# Patient Record
Sex: Female | Born: 1950
Health system: Southern US, Community
[De-identification: ages and names within clinical notes are randomized; demographics above are authoritative.]

## PROBLEM LIST (undated history)

## (undated) ENCOUNTER — Emergency Department (HOSPITAL_COMMUNITY): Admission: EM | Payer: Medicare Other | Source: Home / Self Care

## (undated) DIAGNOSIS — D649 Anemia, unspecified: Secondary | ICD-10-CM

## (undated) DIAGNOSIS — K635 Polyp of colon: Secondary | ICD-10-CM

## (undated) DIAGNOSIS — N959 Unspecified menopausal and perimenopausal disorder: Secondary | ICD-10-CM

## (undated) DIAGNOSIS — F329 Major depressive disorder, single episode, unspecified: Secondary | ICD-10-CM

## (undated) DIAGNOSIS — E860 Dehydration: Secondary | ICD-10-CM

## (undated) DIAGNOSIS — A419 Sepsis, unspecified organism: Secondary | ICD-10-CM

## (undated) DIAGNOSIS — K219 Gastro-esophageal reflux disease without esophagitis: Secondary | ICD-10-CM

## (undated) DIAGNOSIS — F419 Anxiety disorder, unspecified: Secondary | ICD-10-CM

## (undated) DIAGNOSIS — J189 Pneumonia, unspecified organism: Secondary | ICD-10-CM

## (undated) DIAGNOSIS — E669 Obesity, unspecified: Secondary | ICD-10-CM

## (undated) DIAGNOSIS — F3289 Other specified depressive episodes: Secondary | ICD-10-CM

## (undated) DIAGNOSIS — M1990 Unspecified osteoarthritis, unspecified site: Secondary | ICD-10-CM

## (undated) DIAGNOSIS — G4733 Obstructive sleep apnea (adult) (pediatric): Secondary | ICD-10-CM

## (undated) DIAGNOSIS — R011 Cardiac murmur, unspecified: Secondary | ICD-10-CM

## (undated) DIAGNOSIS — Z8489 Family history of other specified conditions: Secondary | ICD-10-CM

## (undated) DIAGNOSIS — Z8719 Personal history of other diseases of the digestive system: Secondary | ICD-10-CM

## (undated) DIAGNOSIS — I10 Essential (primary) hypertension: Secondary | ICD-10-CM

## (undated) DIAGNOSIS — E119 Type 2 diabetes mellitus without complications: Secondary | ICD-10-CM

## (undated) DIAGNOSIS — R6521 Severe sepsis with septic shock: Secondary | ICD-10-CM

## (undated) DIAGNOSIS — G8929 Other chronic pain: Secondary | ICD-10-CM

## (undated) DIAGNOSIS — T8859XA Other complications of anesthesia, initial encounter: Secondary | ICD-10-CM

## (undated) DIAGNOSIS — M549 Dorsalgia, unspecified: Secondary | ICD-10-CM

## (undated) DIAGNOSIS — R05 Cough: Secondary | ICD-10-CM

## (undated) DIAGNOSIS — R058 Other specified cough: Secondary | ICD-10-CM

## (undated) DIAGNOSIS — J4489 Other specified chronic obstructive pulmonary disease: Secondary | ICD-10-CM

## (undated) DIAGNOSIS — E559 Vitamin D deficiency, unspecified: Secondary | ICD-10-CM

## (undated) DIAGNOSIS — T4145XA Adverse effect of unspecified anesthetic, initial encounter: Secondary | ICD-10-CM

## (undated) DIAGNOSIS — M479 Spondylosis, unspecified: Secondary | ICD-10-CM

## (undated) DIAGNOSIS — T7840XA Allergy, unspecified, initial encounter: Secondary | ICD-10-CM

## (undated) DIAGNOSIS — G43909 Migraine, unspecified, not intractable, without status migrainosus: Secondary | ICD-10-CM

## (undated) DIAGNOSIS — E1142 Type 2 diabetes mellitus with diabetic polyneuropathy: Secondary | ICD-10-CM

## (undated) DIAGNOSIS — J449 Chronic obstructive pulmonary disease, unspecified: Secondary | ICD-10-CM

## (undated) HISTORY — DX: Dehydration: E86.0

## (undated) HISTORY — DX: Unspecified menopausal and perimenopausal disorder: N95.9

## (undated) HISTORY — PX: ABDOMINAL HYSTERECTOMY: SHX81

## (undated) HISTORY — PX: CHOLECYSTECTOMY OPEN: SUR202

## (undated) HISTORY — DX: Other specified depressive episodes: F32.89

## (undated) HISTORY — PX: EXPLORATORY LAPAROTOMY: SUR591

## (undated) HISTORY — PX: INCISION AND DRAINAGE ABSCESS: SHX5864

## (undated) HISTORY — DX: Obesity, unspecified: E66.9

## (undated) HISTORY — PX: JOINT REPLACEMENT: SHX530

## (undated) HISTORY — DX: Allergy, unspecified, initial encounter: T78.40XA

## (undated) HISTORY — PX: DILATION AND CURETTAGE OF UTERUS: SHX78

## (undated) HISTORY — PX: APPENDECTOMY: SHX54

## (undated) HISTORY — DX: Essential (primary) hypertension: I10

## (undated) HISTORY — DX: Other specified chronic obstructive pulmonary disease: J44.89

## (undated) HISTORY — PX: EYE SURGERY: SHX253

## (undated) HISTORY — DX: Chronic obstructive pulmonary disease, unspecified: J44.9

## (undated) HISTORY — PX: REFRACTIVE SURGERY: SHX103

## (undated) HISTORY — PX: FOOT SURGERY: SHX648

## (undated) HISTORY — PX: ANTERIOR CERVICAL DECOMP/DISCECTOMY FUSION: SHX1161

## (undated) HISTORY — DX: Spondylosis, unspecified: M47.9

## (undated) HISTORY — DX: Anemia, unspecified: D64.9

## (undated) HISTORY — DX: Vitamin D deficiency, unspecified: E55.9

## (undated) HISTORY — DX: Polyp of colon: K63.5

## (undated) HISTORY — DX: Anxiety disorder, unspecified: F41.9

## (undated) HISTORY — PX: BACK SURGERY: SHX140

## (undated) HISTORY — DX: Major depressive disorder, single episode, unspecified: F32.9

## (undated) HISTORY — DX: Gastro-esophageal reflux disease without esophagitis: K21.9

## (undated) HISTORY — PX: TONSILLECTOMY: SUR1361

---

## 1997-10-23 ENCOUNTER — Encounter: Admission: RE | Admit: 1997-10-23 | Discharge: 1998-01-21 | Payer: Self-pay | Admitting: Pulmonary Disease

## 1998-07-09 ENCOUNTER — Emergency Department (HOSPITAL_COMMUNITY): Admission: EM | Admit: 1998-07-09 | Discharge: 1998-07-09 | Payer: Self-pay | Admitting: Emergency Medicine

## 1999-02-21 ENCOUNTER — Ambulatory Visit (HOSPITAL_COMMUNITY): Admission: RE | Admit: 1999-02-21 | Discharge: 1999-02-21 | Payer: Self-pay | Admitting: Pulmonary Disease

## 1999-02-21 ENCOUNTER — Encounter: Payer: Self-pay | Admitting: Pulmonary Disease

## 1999-02-25 ENCOUNTER — Ambulatory Visit (HOSPITAL_COMMUNITY): Admission: RE | Admit: 1999-02-25 | Discharge: 1999-02-25 | Payer: Self-pay | Admitting: Pulmonary Disease

## 1999-02-25 ENCOUNTER — Encounter: Payer: Self-pay | Admitting: Pulmonary Disease

## 1999-10-13 ENCOUNTER — Inpatient Hospital Stay (HOSPITAL_COMMUNITY): Admission: AD | Admit: 1999-10-13 | Discharge: 1999-10-23 | Payer: Self-pay | Admitting: Pulmonary Disease

## 1999-10-18 ENCOUNTER — Encounter: Payer: Self-pay | Admitting: Pulmonary Disease

## 1999-10-21 ENCOUNTER — Encounter: Payer: Self-pay | Admitting: Pulmonary Disease

## 1999-12-23 ENCOUNTER — Ambulatory Visit (HOSPITAL_COMMUNITY): Admission: RE | Admit: 1999-12-23 | Discharge: 1999-12-23 | Payer: Self-pay | Admitting: Pulmonary Disease

## 2000-01-30 ENCOUNTER — Ambulatory Visit (HOSPITAL_COMMUNITY): Admission: RE | Admit: 2000-01-30 | Discharge: 2000-01-30 | Payer: Self-pay | Admitting: General Surgery

## 2000-03-31 ENCOUNTER — Encounter: Payer: Self-pay | Admitting: Pulmonary Disease

## 2000-03-31 ENCOUNTER — Ambulatory Visit (HOSPITAL_COMMUNITY): Admission: RE | Admit: 2000-03-31 | Discharge: 2000-03-31 | Payer: Self-pay | Admitting: Pulmonary Disease

## 2000-10-03 ENCOUNTER — Emergency Department (HOSPITAL_COMMUNITY): Admission: EM | Admit: 2000-10-03 | Discharge: 2000-10-03 | Payer: Self-pay

## 2001-04-20 ENCOUNTER — Ambulatory Visit (HOSPITAL_COMMUNITY): Admission: RE | Admit: 2001-04-20 | Discharge: 2001-04-20 | Payer: Self-pay | Admitting: Pulmonary Disease

## 2001-04-20 ENCOUNTER — Encounter: Payer: Self-pay | Admitting: Pulmonary Disease

## 2001-08-24 ENCOUNTER — Encounter: Payer: Self-pay | Admitting: Cardiology

## 2001-08-24 ENCOUNTER — Ambulatory Visit (HOSPITAL_COMMUNITY): Admission: RE | Admit: 2001-08-24 | Discharge: 2001-08-24 | Payer: Self-pay | Admitting: Cardiology

## 2002-01-24 ENCOUNTER — Other Ambulatory Visit (HOSPITAL_COMMUNITY): Admission: RE | Admit: 2002-01-24 | Discharge: 2002-02-04 | Payer: Self-pay | Admitting: Psychiatry

## 2002-05-30 ENCOUNTER — Ambulatory Visit (HOSPITAL_COMMUNITY): Admission: RE | Admit: 2002-05-30 | Discharge: 2002-05-30 | Payer: Self-pay | Admitting: Pulmonary Disease

## 2002-05-30 ENCOUNTER — Encounter: Payer: Self-pay | Admitting: Pulmonary Disease

## 2002-10-03 ENCOUNTER — Encounter: Admission: RE | Admit: 2002-10-03 | Discharge: 2003-01-01 | Payer: Self-pay | Admitting: Pulmonary Disease

## 2002-11-23 ENCOUNTER — Emergency Department (HOSPITAL_COMMUNITY): Admission: EM | Admit: 2002-11-23 | Discharge: 2002-11-23 | Payer: Self-pay | Admitting: Emergency Medicine

## 2003-01-03 ENCOUNTER — Encounter: Admission: RE | Admit: 2003-01-03 | Discharge: 2003-04-03 | Payer: Self-pay | Admitting: Pulmonary Disease

## 2003-04-11 ENCOUNTER — Encounter: Admission: RE | Admit: 2003-04-11 | Discharge: 2003-07-10 | Payer: Self-pay | Admitting: Pulmonary Disease

## 2003-07-30 ENCOUNTER — Encounter (HOSPITAL_COMMUNITY): Admission: RE | Admit: 2003-07-30 | Discharge: 2003-10-28 | Payer: Self-pay | Admitting: Cardiology

## 2003-08-06 ENCOUNTER — Ambulatory Visit (HOSPITAL_COMMUNITY): Admission: RE | Admit: 2003-08-06 | Discharge: 2003-08-06 | Payer: Self-pay | Admitting: Pulmonary Disease

## 2003-08-27 ENCOUNTER — Emergency Department (HOSPITAL_COMMUNITY): Admission: EM | Admit: 2003-08-27 | Discharge: 2003-08-27 | Payer: Self-pay | Admitting: Emergency Medicine

## 2003-10-16 ENCOUNTER — Ambulatory Visit: Admission: RE | Admit: 2003-10-16 | Discharge: 2003-10-16 | Payer: Self-pay | Admitting: Orthopedic Surgery

## 2003-10-21 HISTORY — PX: TOTAL KNEE ARTHROPLASTY: SHX125

## 2003-10-24 ENCOUNTER — Inpatient Hospital Stay (HOSPITAL_COMMUNITY): Admission: RE | Admit: 2003-10-24 | Discharge: 2003-10-29 | Payer: Self-pay | Admitting: Orthopedic Surgery

## 2003-10-29 ENCOUNTER — Inpatient Hospital Stay (HOSPITAL_COMMUNITY)
Admission: RE | Admit: 2003-10-29 | Discharge: 2003-11-08 | Payer: Self-pay | Admitting: Physical Medicine & Rehabilitation

## 2003-12-14 ENCOUNTER — Encounter: Admission: RE | Admit: 2003-12-14 | Discharge: 2004-01-15 | Payer: Self-pay | Admitting: Orthopedic Surgery

## 2004-01-21 HISTORY — PX: TOTAL KNEE ARTHROPLASTY: SHX125

## 2004-02-11 ENCOUNTER — Inpatient Hospital Stay (HOSPITAL_COMMUNITY): Admission: RE | Admit: 2004-02-11 | Discharge: 2004-02-14 | Payer: Self-pay | Admitting: Orthopedic Surgery

## 2004-02-14 ENCOUNTER — Inpatient Hospital Stay (HOSPITAL_COMMUNITY)
Admission: RE | Admit: 2004-02-14 | Discharge: 2004-02-22 | Payer: Self-pay | Admitting: Physical Medicine & Rehabilitation

## 2004-02-14 ENCOUNTER — Ambulatory Visit: Payer: Self-pay | Admitting: Physical Medicine & Rehabilitation

## 2004-03-18 ENCOUNTER — Encounter: Admission: RE | Admit: 2004-03-18 | Discharge: 2004-05-22 | Payer: Self-pay | Admitting: Orthopedic Surgery

## 2004-08-11 ENCOUNTER — Inpatient Hospital Stay (HOSPITAL_COMMUNITY): Admission: RE | Admit: 2004-08-11 | Discharge: 2004-08-16 | Payer: Self-pay | Admitting: Neurosurgery

## 2004-08-23 ENCOUNTER — Emergency Department (HOSPITAL_COMMUNITY): Admission: EM | Admit: 2004-08-23 | Discharge: 2004-08-23 | Payer: Self-pay | Admitting: Emergency Medicine

## 2004-10-28 ENCOUNTER — Ambulatory Visit (HOSPITAL_COMMUNITY): Admission: RE | Admit: 2004-10-28 | Discharge: 2004-10-28 | Payer: Self-pay | Admitting: Family Medicine

## 2004-12-05 ENCOUNTER — Encounter: Admission: RE | Admit: 2004-12-05 | Discharge: 2004-12-19 | Payer: Self-pay | Admitting: Orthopedic Surgery

## 2004-12-15 ENCOUNTER — Emergency Department (HOSPITAL_COMMUNITY): Admission: EM | Admit: 2004-12-15 | Discharge: 2004-12-15 | Payer: Self-pay | Admitting: Emergency Medicine

## 2004-12-20 ENCOUNTER — Encounter: Admission: RE | Admit: 2004-12-20 | Discharge: 2005-01-28 | Payer: Self-pay | Admitting: Orthopedic Surgery

## 2004-12-24 ENCOUNTER — Ambulatory Visit (HOSPITAL_BASED_OUTPATIENT_CLINIC_OR_DEPARTMENT_OTHER): Admission: RE | Admit: 2004-12-24 | Discharge: 2004-12-24 | Payer: Self-pay | Admitting: Internal Medicine

## 2005-01-04 ENCOUNTER — Ambulatory Visit: Payer: Self-pay | Admitting: Internal Medicine

## 2005-02-16 ENCOUNTER — Ambulatory Visit (HOSPITAL_BASED_OUTPATIENT_CLINIC_OR_DEPARTMENT_OTHER): Admission: RE | Admit: 2005-02-16 | Discharge: 2005-02-16 | Payer: Self-pay | Admitting: Internal Medicine

## 2005-02-23 ENCOUNTER — Ambulatory Visit: Payer: Self-pay | Admitting: Internal Medicine

## 2005-03-03 ENCOUNTER — Ambulatory Visit: Payer: Self-pay | Admitting: Family Medicine

## 2005-03-03 ENCOUNTER — Encounter: Payer: Self-pay | Admitting: Family Medicine

## 2005-03-03 ENCOUNTER — Ambulatory Visit (HOSPITAL_COMMUNITY): Admission: RE | Admit: 2005-03-03 | Discharge: 2005-03-03 | Payer: Self-pay | Admitting: Neurosurgery

## 2005-04-14 ENCOUNTER — Ambulatory Visit: Payer: Self-pay | Admitting: Internal Medicine

## 2005-04-15 ENCOUNTER — Ambulatory Visit: Payer: Self-pay | Admitting: Internal Medicine

## 2005-04-20 ENCOUNTER — Ambulatory Visit (HOSPITAL_COMMUNITY): Admission: RE | Admit: 2005-04-20 | Discharge: 2005-04-20 | Payer: Self-pay | Admitting: Internal Medicine

## 2005-07-03 ENCOUNTER — Ambulatory Visit: Payer: Self-pay | Admitting: Internal Medicine

## 2005-08-07 ENCOUNTER — Encounter: Admission: RE | Admit: 2005-08-07 | Discharge: 2005-11-05 | Payer: Self-pay | Admitting: Orthopedic Surgery

## 2005-11-18 ENCOUNTER — Encounter (HOSPITAL_COMMUNITY): Admission: RE | Admit: 2005-11-18 | Discharge: 2006-01-11 | Payer: Self-pay | Admitting: Cardiology

## 2005-11-19 ENCOUNTER — Ambulatory Visit (HOSPITAL_COMMUNITY): Admission: RE | Admit: 2005-11-19 | Discharge: 2005-11-19 | Payer: Self-pay | Admitting: Pulmonary Disease

## 2005-12-18 ENCOUNTER — Encounter: Admission: RE | Admit: 2005-12-18 | Discharge: 2006-01-08 | Payer: Self-pay | Admitting: Family Medicine

## 2006-01-25 ENCOUNTER — Ambulatory Visit: Payer: Self-pay | Admitting: Internal Medicine

## 2006-01-26 ENCOUNTER — Ambulatory Visit (HOSPITAL_COMMUNITY): Admission: RE | Admit: 2006-01-26 | Discharge: 2006-01-26 | Payer: Self-pay | Admitting: Pulmonary Disease

## 2006-01-29 ENCOUNTER — Ambulatory Visit (HOSPITAL_COMMUNITY): Admission: RE | Admit: 2006-01-29 | Discharge: 2006-01-29 | Payer: Self-pay | Admitting: Internal Medicine

## 2006-02-08 ENCOUNTER — Ambulatory Visit: Payer: Self-pay | Admitting: Internal Medicine

## 2006-03-09 ENCOUNTER — Ambulatory Visit (HOSPITAL_COMMUNITY): Admission: RE | Admit: 2006-03-09 | Discharge: 2006-03-09 | Payer: Self-pay | Admitting: Orthopedic Surgery

## 2006-07-12 ENCOUNTER — Ambulatory Visit (HOSPITAL_BASED_OUTPATIENT_CLINIC_OR_DEPARTMENT_OTHER): Admission: RE | Admit: 2006-07-12 | Discharge: 2006-07-12 | Payer: Self-pay | Admitting: Pulmonary Disease

## 2006-07-18 ENCOUNTER — Ambulatory Visit: Payer: Self-pay | Admitting: Internal Medicine

## 2006-12-07 ENCOUNTER — Ambulatory Visit (HOSPITAL_COMMUNITY): Admission: RE | Admit: 2006-12-07 | Discharge: 2006-12-07 | Payer: Self-pay | Admitting: Pulmonary Disease

## 2006-12-09 ENCOUNTER — Encounter (HOSPITAL_COMMUNITY): Admission: RE | Admit: 2006-12-09 | Discharge: 2006-12-13 | Payer: Self-pay | Admitting: Cardiology

## 2007-03-07 ENCOUNTER — Encounter: Admission: RE | Admit: 2007-03-07 | Discharge: 2007-03-07 | Payer: Self-pay | Admitting: Cardiology

## 2007-03-07 ENCOUNTER — Encounter: Admission: RE | Admit: 2007-03-07 | Discharge: 2007-03-07 | Payer: Self-pay | Admitting: Neurosurgery

## 2007-03-14 ENCOUNTER — Ambulatory Visit (HOSPITAL_COMMUNITY): Admission: RE | Admit: 2007-03-14 | Discharge: 2007-03-14 | Payer: Self-pay | Admitting: Cardiology

## 2007-07-19 ENCOUNTER — Ambulatory Visit: Payer: Self-pay | Admitting: Internal Medicine

## 2007-07-19 LAB — CONVERTED CEMR LAB
Eosinophils Absolute: 0.1 10*3/uL (ref 0.0–0.6)
Eosinophils Relative: 1.5 % (ref 0.0–5.0)
HCT: 36.3 % (ref 36.0–46.0)
Lymphocytes Relative: 29.2 % (ref 12.0–46.0)
MCV: 86.6 fL (ref 78.0–100.0)
Neutro Abs: 4.6 10*3/uL (ref 1.4–7.7)
Neutrophils Relative %: 63.7 % (ref 43.0–77.0)
Saturation Ratios: 14.7 % — ABNORMAL LOW (ref 20.0–50.0)
WBC: 7.2 10*3/uL (ref 4.5–10.5)

## 2007-09-26 ENCOUNTER — Ambulatory Visit: Payer: Self-pay | Admitting: Surgery

## 2007-11-29 ENCOUNTER — Ambulatory Visit: Payer: Self-pay | Admitting: Internal Medicine

## 2007-12-02 ENCOUNTER — Ambulatory Visit: Payer: Self-pay | Admitting: Internal Medicine

## 2007-12-02 ENCOUNTER — Encounter: Payer: Self-pay | Admitting: Internal Medicine

## 2007-12-05 ENCOUNTER — Encounter: Payer: Self-pay | Admitting: Internal Medicine

## 2008-01-16 ENCOUNTER — Ambulatory Visit (HOSPITAL_COMMUNITY): Admission: RE | Admit: 2008-01-16 | Discharge: 2008-01-16 | Payer: Self-pay | Admitting: Pulmonary Disease

## 2008-02-14 ENCOUNTER — Ambulatory Visit (HOSPITAL_COMMUNITY): Admission: RE | Admit: 2008-02-14 | Discharge: 2008-02-14 | Payer: Self-pay | Admitting: Pulmonary Disease

## 2008-02-23 ENCOUNTER — Emergency Department (HOSPITAL_COMMUNITY): Admission: EM | Admit: 2008-02-23 | Discharge: 2008-02-23 | Payer: Self-pay | Admitting: Emergency Medicine

## 2008-03-01 ENCOUNTER — Telehealth: Payer: Self-pay | Admitting: Internal Medicine

## 2008-03-13 ENCOUNTER — Encounter: Admission: RE | Admit: 2008-03-13 | Discharge: 2008-03-28 | Payer: Self-pay | Admitting: Podiatry

## 2008-05-14 ENCOUNTER — Encounter: Admission: RE | Admit: 2008-05-14 | Discharge: 2008-06-18 | Payer: Self-pay | Admitting: Podiatry

## 2008-05-29 ENCOUNTER — Telehealth: Payer: Self-pay | Admitting: Internal Medicine

## 2008-05-30 ENCOUNTER — Encounter: Admission: RE | Admit: 2008-05-30 | Discharge: 2008-08-28 | Payer: Self-pay | Admitting: Internal Medicine

## 2008-06-13 ENCOUNTER — Telehealth: Payer: Self-pay | Admitting: Internal Medicine

## 2008-07-03 ENCOUNTER — Encounter: Admission: RE | Admit: 2008-07-03 | Discharge: 2008-08-02 | Payer: Self-pay | Admitting: Podiatry

## 2008-08-16 ENCOUNTER — Ambulatory Visit: Payer: Self-pay | Admitting: Sports Medicine

## 2008-08-16 DIAGNOSIS — M7660 Achilles tendinitis, unspecified leg: Secondary | ICD-10-CM

## 2008-08-16 DIAGNOSIS — M79609 Pain in unspecified limb: Secondary | ICD-10-CM

## 2008-08-17 ENCOUNTER — Encounter: Payer: Self-pay | Admitting: Sports Medicine

## 2008-09-13 ENCOUNTER — Ambulatory Visit: Payer: Self-pay | Admitting: Sports Medicine

## 2008-10-25 ENCOUNTER — Ambulatory Visit: Payer: Self-pay | Admitting: Sports Medicine

## 2008-11-14 ENCOUNTER — Encounter: Payer: Self-pay | Admitting: Internal Medicine

## 2009-06-25 ENCOUNTER — Ambulatory Visit: Payer: Self-pay | Admitting: Sports Medicine

## 2009-08-01 ENCOUNTER — Ambulatory Visit: Payer: Self-pay | Admitting: Sports Medicine

## 2009-08-01 DIAGNOSIS — M7750 Other enthesopathy of unspecified foot: Secondary | ICD-10-CM

## 2009-09-09 ENCOUNTER — Telehealth: Payer: Self-pay | Admitting: Internal Medicine

## 2009-09-09 ENCOUNTER — Ambulatory Visit: Payer: Self-pay | Admitting: Gastroenterology

## 2009-09-09 DIAGNOSIS — I10 Essential (primary) hypertension: Secondary | ICD-10-CM | POA: Insufficient documentation

## 2009-09-09 DIAGNOSIS — M549 Dorsalgia, unspecified: Secondary | ICD-10-CM | POA: Insufficient documentation

## 2009-09-09 DIAGNOSIS — R079 Chest pain, unspecified: Secondary | ICD-10-CM

## 2009-09-09 DIAGNOSIS — R110 Nausea: Secondary | ICD-10-CM

## 2009-09-09 DIAGNOSIS — G4730 Sleep apnea, unspecified: Secondary | ICD-10-CM

## 2009-09-09 DIAGNOSIS — K219 Gastro-esophageal reflux disease without esophagitis: Secondary | ICD-10-CM

## 2009-09-09 DIAGNOSIS — J45909 Unspecified asthma, uncomplicated: Secondary | ICD-10-CM | POA: Insufficient documentation

## 2009-09-09 DIAGNOSIS — J40 Bronchitis, not specified as acute or chronic: Secondary | ICD-10-CM | POA: Insufficient documentation

## 2009-09-16 ENCOUNTER — Ambulatory Visit: Payer: Self-pay | Admitting: Endocrinology

## 2009-09-16 DIAGNOSIS — G622 Polyneuropathy due to other toxic agents: Secondary | ICD-10-CM

## 2009-09-16 DIAGNOSIS — G619 Inflammatory polyneuropathy, unspecified: Secondary | ICD-10-CM | POA: Insufficient documentation

## 2009-09-16 DIAGNOSIS — F329 Major depressive disorder, single episode, unspecified: Secondary | ICD-10-CM

## 2009-09-30 ENCOUNTER — Ambulatory Visit: Payer: Self-pay | Admitting: Endocrinology

## 2009-10-15 ENCOUNTER — Ambulatory Visit: Payer: Self-pay | Admitting: Endocrinology

## 2009-10-30 ENCOUNTER — Telehealth: Payer: Self-pay | Admitting: Internal Medicine

## 2009-11-22 ENCOUNTER — Ambulatory Visit: Payer: Self-pay | Admitting: Internal Medicine

## 2009-11-22 DIAGNOSIS — D649 Anemia, unspecified: Secondary | ICD-10-CM

## 2009-11-22 DIAGNOSIS — J4489 Other specified chronic obstructive pulmonary disease: Secondary | ICD-10-CM | POA: Insufficient documentation

## 2009-11-22 DIAGNOSIS — N959 Unspecified menopausal and perimenopausal disorder: Secondary | ICD-10-CM | POA: Insufficient documentation

## 2009-11-22 DIAGNOSIS — M479 Spondylosis, unspecified: Secondary | ICD-10-CM | POA: Insufficient documentation

## 2009-11-22 DIAGNOSIS — E559 Vitamin D deficiency, unspecified: Secondary | ICD-10-CM | POA: Insufficient documentation

## 2009-11-22 DIAGNOSIS — J449 Chronic obstructive pulmonary disease, unspecified: Secondary | ICD-10-CM

## 2009-11-22 DIAGNOSIS — M25559 Pain in unspecified hip: Secondary | ICD-10-CM

## 2009-11-23 LAB — CONVERTED CEMR LAB
Albumin: 3.6 g/dL (ref 3.5–5.2)
BUN: 25 mg/dL — ABNORMAL HIGH (ref 6–23)
Basophils Relative: 0.4 % (ref 0.0–3.0)
Bilirubin, Direct: 0.1 mg/dL (ref 0.0–0.3)
CO2: 32 meq/L (ref 19–32)
Chloride: 102 meq/L (ref 96–112)
Cholesterol: 183 mg/dL (ref 0–200)
Creatinine, Ser: 1 mg/dL (ref 0.4–1.2)
Eosinophils Absolute: 0.1 10*3/uL (ref 0.0–0.7)
Folate: >20 ng/mL
HCT: 36.1 % (ref 36.0–46.0)
Iron: 57 ug/dL (ref 42–145)
Ketones, ur: NEGATIVE mg/dL
Lymphs Abs: 1.8 10*3/uL (ref 0.7–4.0)
MCHC: 33.7 g/dL (ref 30.0–36.0)
MCV: 84.7 fL (ref 78.0–100.0)
Monocytes Absolute: 0.4 10*3/uL (ref 0.1–1.0)
Neutrophils Relative %: 55.9 % (ref 43.0–77.0)
RBC: 4.27 M/uL (ref 3.87–5.11)
Saturation Ratios: 13.6 % — ABNORMAL LOW (ref 20.0–50.0)
Specific Gravity, Urine: 1.025 (ref 1.000–1.030)
TSH: 1.77 microintl units/mL (ref 0.35–5.50)
Total Protein, Urine: NEGATIVE mg/dL
Total Protein: 7.3 g/dL (ref 6.0–8.3)
Triglycerides: 100 mg/dL (ref 0.0–149.0)
Urine Glucose: NEGATIVE mg/dL
Vit D, 25-Hydroxy: 22 ng/mL — ABNORMAL LOW (ref 30–89)
Vitamin B-12: 639 pg/mL (ref 211–911)
pH: 5.5 (ref 5.0–8.0)

## 2009-11-25 ENCOUNTER — Ambulatory Visit: Payer: Self-pay | Admitting: Internal Medicine

## 2009-11-25 ENCOUNTER — Ambulatory Visit: Payer: Self-pay | Admitting: Family Medicine

## 2009-12-06 ENCOUNTER — Telehealth: Payer: Self-pay | Admitting: Internal Medicine

## 2009-12-12 ENCOUNTER — Telehealth: Payer: Self-pay | Admitting: Internal Medicine

## 2009-12-13 ENCOUNTER — Telehealth: Payer: Self-pay | Admitting: Internal Medicine

## 2009-12-25 ENCOUNTER — Telehealth: Payer: Self-pay | Admitting: Internal Medicine

## 2010-01-31 ENCOUNTER — Ambulatory Visit: Payer: Self-pay | Admitting: Internal Medicine

## 2010-01-31 ENCOUNTER — Encounter: Payer: Self-pay | Admitting: Internal Medicine

## 2010-01-31 ENCOUNTER — Emergency Department (HOSPITAL_COMMUNITY): Admission: EM | Admit: 2010-01-31 | Discharge: 2010-01-31 | Payer: Self-pay | Admitting: Emergency Medicine

## 2010-01-31 DIAGNOSIS — E860 Dehydration: Secondary | ICD-10-CM | POA: Insufficient documentation

## 2010-02-04 ENCOUNTER — Telehealth: Payer: Self-pay | Admitting: Internal Medicine

## 2010-02-07 ENCOUNTER — Telehealth: Payer: Self-pay | Admitting: Internal Medicine

## 2010-02-12 ENCOUNTER — Ambulatory Visit: Payer: Self-pay | Admitting: Internal Medicine

## 2010-02-12 DIAGNOSIS — R062 Wheezing: Secondary | ICD-10-CM

## 2010-03-03 ENCOUNTER — Telehealth: Payer: Self-pay | Admitting: Internal Medicine

## 2010-03-06 ENCOUNTER — Encounter: Payer: Self-pay | Admitting: Internal Medicine

## 2010-03-26 ENCOUNTER — Encounter: Payer: Self-pay | Admitting: Internal Medicine

## 2010-03-27 ENCOUNTER — Telehealth: Payer: Self-pay | Admitting: Internal Medicine

## 2010-05-01 ENCOUNTER — Telehealth: Payer: Self-pay | Admitting: Internal Medicine

## 2010-05-20 ENCOUNTER — Emergency Department (HOSPITAL_COMMUNITY): Admission: EM | Admit: 2010-05-20 | Discharge: 2010-05-20 | Payer: Self-pay | Admitting: Emergency Medicine

## 2010-05-20 ENCOUNTER — Ambulatory Visit: Payer: Self-pay | Admitting: Internal Medicine

## 2010-05-27 ENCOUNTER — Ambulatory Visit: Payer: Self-pay | Admitting: Internal Medicine

## 2010-05-27 ENCOUNTER — Encounter: Payer: Self-pay | Admitting: Internal Medicine

## 2010-05-27 DIAGNOSIS — R350 Frequency of micturition: Secondary | ICD-10-CM

## 2010-05-28 ENCOUNTER — Encounter: Payer: Self-pay | Admitting: Internal Medicine

## 2010-05-28 ENCOUNTER — Ambulatory Visit: Payer: Self-pay | Admitting: Internal Medicine

## 2010-05-28 LAB — CONVERTED CEMR LAB
Basophils Absolute: 0.1 10*3/uL (ref 0.0–0.1)
CO2: 29 meq/L (ref 19–32)
Calcium: 9.4 mg/dL (ref 8.4–10.5)
Cholesterol: 181 mg/dL (ref 0–200)
Eosinophils Absolute: 0.2 10*3/uL (ref 0.0–0.7)
GFR calc non Af Amer: 52.7 mL/min — ABNORMAL LOW (ref >60.00)
Glucose, Bld: 304 mg/dL — ABNORMAL HIGH (ref 70–99)
HCT: 36.1 % (ref 36.0–46.0)
HDL: 54.4 mg/dL (ref >39.00)
Hemoglobin, Urine: NEGATIVE
Hemoglobin: 12.3 g/dL (ref 12.0–15.0)
Lymphs Abs: 2.2 10*3/uL (ref 0.7–4.0)
MCHC: 34 g/dL (ref 30.0–36.0)
Monocytes Absolute: 0.3 10*3/uL (ref 0.1–1.0)
Monocytes Relative: 4.7 % (ref 3.0–12.0)
Neutro Abs: 3.6 10*3/uL (ref 1.4–7.7)
Platelets: 239 10*3/uL (ref 150.0–400.0)
Potassium: 4.6 meq/L (ref 3.5–5.1)
RDW: 15.4 % — ABNORMAL HIGH (ref 11.5–14.6)
Sodium: 139 meq/L (ref 135–145)
Total CHOL/HDL Ratio: 3
Total Protein, Urine: NEGATIVE mg/dL
Triglycerides: 142 mg/dL (ref 0.0–149.0)
Urine Glucose: >=1000 mg/dL
Urobilinogen, UA: 0.2 (ref 0.0–1.0)

## 2010-06-06 ENCOUNTER — Ambulatory Visit: Payer: Self-pay | Admitting: Endocrinology

## 2010-06-12 ENCOUNTER — Encounter: Payer: Self-pay | Admitting: Internal Medicine

## 2010-06-24 ENCOUNTER — Telehealth: Payer: Self-pay | Admitting: Internal Medicine

## 2010-07-01 ENCOUNTER — Telehealth: Payer: Self-pay | Admitting: Internal Medicine

## 2010-07-08 ENCOUNTER — Telehealth: Payer: Self-pay | Admitting: Internal Medicine

## 2010-07-10 ENCOUNTER — Telehealth: Payer: Self-pay | Admitting: Endocrinology

## 2010-07-12 ENCOUNTER — Encounter: Payer: Self-pay | Admitting: Endocrinology

## 2010-07-12 ENCOUNTER — Encounter: Payer: Self-pay | Admitting: Pulmonary Disease

## 2010-07-12 ENCOUNTER — Encounter: Payer: Self-pay | Admitting: Cardiology

## 2010-07-13 ENCOUNTER — Encounter: Payer: Self-pay | Admitting: Orthopedic Surgery

## 2010-07-13 ENCOUNTER — Encounter: Payer: Self-pay | Admitting: Pulmonary Disease

## 2010-07-17 ENCOUNTER — Ambulatory Visit
Admission: RE | Admit: 2010-07-17 | Discharge: 2010-07-17 | Payer: Self-pay | Source: Home / Self Care | Attending: Endocrinology | Admitting: Endocrinology

## 2010-07-22 NOTE — Progress Notes (Signed)
Summary: DM Shoes  Phone Note Call from Patient Call back at Home Phone [phone]   Caller: Patient Summary of Call: Pt called requesting Rx for Diabetic shoes to Dr Charlsie Merles. If okay to generate I will fax to requested MD. Initial call taken by: Margaret Pyle, CMA,  March 03, 2010 2:39 PM  Follow-up for Phone Call        ok for rx Follow-up by: Corwin Levins MD,  March 03, 2010 5:45 PM  Additional Follow-up for Phone Call Additional follow up Details #1::        Generated order. Called pt to get fax #. Pt was'nt sure she states will pick-up. Left in cabinet up front. Additional Follow-up by: Orlan Leavens RMA,  March 04, 2010 9:27 AM

## 2010-07-22 NOTE — Progress Notes (Signed)
Summary: Medication Refill  Phone Note Refill Request Message from:  Fax from Pharmacy on December 25, 2009 1:05 PM  Refills Requested: Medication #1:  TRAMADOL HCL 50 MG TABS Take 1 tablet by mouth four times daily   Dosage confirmed as above?Dosage Confirmed   Notes: [facility] [phone] Initial call taken by: Zella Ball Ewing CMA Duncan Dull),  December 25, 2009 1:05 PM  Follow-up for Phone Call        done escript Follow-up by: Corwin Levins MD,  December 25, 2009 1:11 PM    Prescriptions: TRAMADOL HCL 50 MG TABS (TRAMADOL HCL) Take 1 tablet by mouth four times daily  #120 x 2   Entered and Authorized by:   Corwin Levins MD   Signed by:   Corwin Levins MD on 12/25/2009   Method used:   Electronically to        [facility] [id number] (retail)       [address] [place], [place]  [address]       Ph: [phone]       Fax: [phone]   RxID:   [id number]

## 2010-07-22 NOTE — Progress Notes (Signed)
Summary: medication change  Phone Note From Pharmacy   Caller: [facility] [address] [phone]* Summary of Call: Patient states she is taking 1 1/2 tabs two times a day, if this is correct needs new rx. Initial call taken by: Robin Ewing CMA Duncan Dull),  March 27, 2010 1:03 PM  Follow-up for Phone Call        to which med does this refer? Follow-up by: Corwin Levins MD,  March 27, 2010 2:57 PM  Additional Follow-up for Phone Call Additional follow up Details #1::        sorry, Metoprolol 50mg  Additional Follow-up by: Zella Ball Ewing CMA Duncan Dull),  March 27, 2010 3:40 PM    Additional Follow-up for Phone Call Additional follow up Details #2::    ok for the two times a day dosing  to robin to handle Follow-up by: Corwin Levins MD,  March 27, 2010 5:17 PM  New/Updated Medications: METOPROLOL TARTRATE 50 MG TABS (METOPROLOL TARTRATE) Take 1 1/2 tablet two times a day Prescriptions: METOPROLOL TARTRATE 50 MG TABS (METOPROLOL TARTRATE) Take 1 1/2 tablet two times a day  #90 x 2   Entered by:   Zella Ball Ewing CMA (AAMA)   Authorized by:   Corwin Levins MD   Signed by:   Scharlene Gloss CMA (AAMA) on 03/28/2010   Method used:   Faxed to ...       [facility] [id number] (retail)       [address] [place], [place]  [address]       Ph: [phone]       Fax: [phone]   RxID:   [id number]

## 2010-07-22 NOTE — Progress Notes (Signed)
Summary: Rx refill req  Phone Note Call from Patient Call back at Home Phone [phone]   Caller: Patient Summary of Call: Pt called requesting 90 day supply to [facility] of:  Tramadol, Tylenol #4, Neurotin, Vesicare and Potassium. Pt is also requesting a cheaper alternative to Nexium (90 day supply as well). Per pt, Insurance is now requiring 3 month supply of her medications. Initial call taken by: Margaret Pyle, CMA,  February 04, 2010 9:24 AM  Follow-up for Phone Call        ok to change the nexium to omeprazole 20 two times a day; and ok for vesicare and K to robin for routine refills  we normally do not do 3 mo large quantity prescriptions for controlled meds;  ok for tylenol #4 "usual rx" but not due until sept 1 (.done to dahlia );  tramadol not due until october 4 so will hold off for now  ok to re-do the neurotin rx but by law we are only able to 3 mo and 1 refill before new rx required (this is special for this med) Follow-up by: Corwin Levins MD,  February 04, 2010 9:48 AM    New/Updated Medications: TYLENOL WITH CODEINE #4 300-60 MG TABS (ACETAMINOPHEN-CODEINE) Take 1 tablet every 6 hours as needed - to fill sept 1, 2011 OMEPRAZOLE 20 MG CPDR (OMEPRAZOLE) 1 by mouth bid Prescriptions: OMEPRAZOLE 20 MG CPDR (OMEPRAZOLE) 1 by mouth bid  #180 x 3   Entered by:   Scharlene Gloss CMA (AAMA)   Authorized by:   Corwin Levins MD   Signed by:   Scharlene Gloss CMA (AAMA) on 02/04/2010   Method used:   Faxed to ...       [facility] [address] (retail)       [address] [place], [place]  [address]       Ph: [phone]       Fax: [phone]   RxID:   [id number] KLOR-CON 10 10 MEQ CR-TABS (POTASSIUM CHLORIDE) Take 1 tablet by mouth two times a day  #180 x 3   Entered by:   Scharlene Gloss CMA (AAMA)   Authorized by:   Corwin Levins MD   Signed by:   Scharlene Gloss CMA (AAMA) on 02/04/2010   Method used:   Faxed to ...       [facility] [address] (retail)       [address] [place], [place]  [address]       Ph: [phone]       Fax: [phone]   RxID:   [id number] VESICARE 10 MG TABS (SOLIFENACIN SUCCINATE) Take 1 tablet by mouth once a day  #90 x 3   Entered by:   Zella Ball Ewing CMA (AAMA)   Authorized by:   Corwin Levins MD   Signed by:   Scharlene Gloss CMA (AAMA) on 02/04/2010   Method used:   Faxed to ...       [facility] [address] (retail)       [address] [place], [place]  [address]       Ph: [phone]       Fax: [phone]   RxID:  [id number] OMEPRAZOLE 20 MG CPDR (OMEPRAZOLE) 1 by mouth bid  #90 x 3   Entered by:   Scharlene Gloss CMA (AAMA)   Authorized by:   Corwin Levins MD   Signed by:   Scharlene Gloss CMA (AAMA) on 02/04/2010   Method used:   Faxed to ...       [facility] [address] (retail)       [address] [place], [place]  [address]       Ph: [phone]       Fax: [phone]   RxID:   [id number] TYLENOL WITH CODEINE #4 300-60 MG TABS (ACETAMINOPHEN-CODEINE) Take 1 tablet every 6 hours as needed - to fill sept 1, 2011  #60 x 2   Entered and Authorized by:   Corwin Levins MD   Signed by:   Corwin Levins MD on 02/04/2010   Method used:   Print then Give to Patient   RxID:   [id number] NEURONTIN 600 MG TABS (GABAPENTIN) Take 1 tablet by mouth three times a day  #270 x 1   Entered and Authorized by:   Corwin Levins MD   Signed by:   Corwin Levins MD on 02/04/2010   Method used:   Print then Give to Patient   RxID:   [id number]   Appended Document: Rx refill req faxed hardcopy of Tylenol with Codeine and Neurontin prescription to pharmacy.

## 2010-07-22 NOTE — Progress Notes (Signed)
  Phone Note Refill Request  on December 12, 2009 1:47 PM  Refills Requested: Medication #1:  KLOR-CON 10 10 MEQ CR-TABS Take 1 tablet by mouth two times a day   Dosage confirmed as above?Dosage Confirmed   Notes: [facility] [place] Initial call taken by: Scharlene Gloss CMA Duncan Dull),  December 12, 2009 1:47 PM    Prescriptions: KLOR-CON 10 10 MEQ CR-TABS (POTASSIUM CHLORIDE) Take 1 tablet by mouth two times a day  #60 x 11   Entered by:   Zella Ball Ewing CMA (AAMA)   Authorized by:   Corwin Levins MD   Signed by:   Scharlene Gloss CMA (AAMA) on 12/12/2009   Method used:   Faxed to ...       [facility] [address] (retail)       [address] [place], [place]  [address]       Ph: [phone]       Fax: [phone]   RxID:   [id number]

## 2010-07-22 NOTE — Assessment & Plan Note (Signed)
Summary: new endo / diabetes / [place],g/medicare/cd   Vital Signs:  Patient profile:   60 year old female Height:      65 inches (165.10 cm) Weight:      277 pounds (125.91 kg) O2 Sat:      96 % on Room air Temp:     97.6 degrees F (36.44 degrees C) oral Pulse rate:   84 / minute BP sitting:   110 / 60  (left arm) Cuff size:   large  Vitals Entered ByMarland Kitchen Orlan Leavens (September 16, 2009 2:53 PM)Sherese Christopher SMA  O2 Flow:  Room air CC: New Endo: Diabetes//[place]   Referring Provider:  n/a Primary Provider:  Corine Shelter, MD  CC:  New Endo: Diabetes//[place].  History of Present Illness: pt states approx 20 years h/o dm.  it is complicated by retinopathy.  she has been on insulin x approx 10 years.  she takes lantus and symlin.  no cbg record, but states cbg's are "high all the time." pt says her diet and exercise are improved.   symptomatically, pt states few years of moderate pain of the legs, and associated numbness.   Current Medications (verified): 1)  Nexium 40 Mg Cpdr (Esomeprazole Magnesium) .... Take 1 Tablet By Mouth Two Times A Day 2)  Nitroglycerin 0.2 Mg/hr Pt24 (Nitroglycerin) .... Apply 1/4 Patch To Achilles Tendon Once Daily. 3)  Lantus 100 Unit/ml Soln (Insulin Glargine) .... Take 40 Units Subcutaneously Every Morning and 40 Units Every Evening. 4)  Glucophage 1000 Mg Tabs (Metformin Hcl) .Marland Kitchen.. 1 By Mouth Bid 5)  Symlin 600 Mcg/ml Soln (Pramlintide Acetate) .Marland Kitchen.. 120 Micrograms Tid 6)  Advair Diskus 100-50 Mcg/dose Aepb (Fluticasone-Salmeterol) .... Take 2 Puffs Two Times A Day 7)  Tramadol Hcl 50 Mg Tabs (Tramadol Hcl) .... Take 1 Tablet By Mouth Four Times Daily 8)  Vesicare 10 Mg Tabs (Solifenacin Succinate) .... Take 1 Tablet By Mouth Once A Day 9)  Multivitamins  Tabs (Multiple Vitamin) .... Take 1 Tablet By Mouth Once A Day 10)  Vitamin D 1000 Unit Tabs (Cholecalciferol) .... Take 2 Tablets By Mouth Once Daily 11)  Metoprolol Tartrate 50 Mg Tabs (Metoprolol  Tartrate) .... Take 1 1/2 Tablet Daily 12)  Promethazine Hcl 25 Mg Tabs (Promethazine Hcl) .... Take 1-2 Tablets Every 4-6 Hours As Needed For Nausea 13)  Maalox Advanced Max St 400-400-40 Mg/30ml Susp (Alum & Mag Hydroxide-Simeth) .... "turns The Bottle Up and Drinks" Three Times Daily 14)  Furosemide 40 Mg Tabs (Furosemide) .... Take 1 Tablet By Mouth Once A Day 15)  Diovan 320 Mg Tabs (Valsartan) .... Take 1 Tablet By Mouth Once A Day 16)  Aspirin 81 Mg Tbec (Aspirin) .... Take 1 Tablet By Mouth Once A Day 17)  Meperidine Hcl 50 Mg Tabs (Meperidine Hcl) .... Take 1 Tablet By Mouth Eveyr 6 Hours As Needed 18)  Promethazine-Codeine 6.25-10 Mg/86ml Syrp (Promethazine-Codeine) .... Take 1 Teaspoonful Every Four Hours As Needed For Cough 19)  Neurontin 600 Mg Tabs (Gabapentin) .... Take 1 Tablet By Mouth Three Times A Day 20)  Clindamycin Hcl 150 Mg Caps (Clindamycin Hcl) .... Take 4 Capsules By Mouth 1 Hour Before Dental Procedures 21)  Klor-Con 10 10 Meq Cr-Tabs (Potassium Chloride) .... Take 1 Tablet By Mouth Two Times A Day 22)  Tylenol With Codeine #4 300-60 Mg Tabs (Acetaminophen-Codeine) .... Take 1 Tablet Every 6 Hours As Needed (Not Taking With Liquid Codeine) 23)  Diflucan 150 Mg Tabs (Fluconazole) .... Take 1 Tablet  By Mouth As Needed Yeast Infection 24)  Albuterol Breathing Treatment .... Use As Needed 25)  Oxygen .... At Bedtime 26)  Nexium 40 Mg Cpdr (Esomeprazole Magnesium) .... Take 1 Cap Twice Daily  Allergies (verified): 1)  ! Pcn 2)  ! Ace Inhibitors 3)  ! * Tape  Past History:  Past Medical History: Last updated: 09/09/2009 Current Problems:  GERD (ICD-530.81) OBESITY ASTHMA (ICD-493.90) SLEEP APNEA (ICD-780.57) BRONCHITIS (ICD-490) HYPERTENSION (ICD-401.9) BACK PAIN (ICD-724.5) DIAB W/O COMP TYPE II/UNS NOT STATED UNCNTRL (ICD-250.00) SINUS TARSI SYNDROME (ICD-726.79) ACHILLES TENDINITIS (ICD-726.71) FOOT PAIN (ICD-729.5)    Family History: Reviewed history  from 09/09/2009 and no changes required. No FH of Colon Cancer: Family History of Esophageal Cancer:Sister?, Brother Family History of Colon Polyps:Brother Family History of Pancreatic Cancer: Sister? Family History of Diabetes: Sister, Burnadette Pop Family History of Heart Disease: MGF  Social History: Reviewed history from 09/09/2009 and no changes required. Patient is a former smoker. -stopped 43 Alcohol Use - no Illicit Drug Use - no Patient does not get regular exercise.  widowed 2004 disabled  Review of Systems       denies weight loss, headache, chest pain, sob, n/v, cramps, memory loss, menopausal sxs, hypoglycemia, and easy bruising.  she sttributes chest pain to gerd.  she reports tearing of the eyes, muscle cramps, depression, rhinorrhea, and polyuria.   Physical Exam  General:  morbidly obese.   Head:  head: no deformity eyes: no periorbital swelling, no proptosis external nose and ears are normal mouth: no lesion seen. Neck:  Supple without thyroid enlargement or tenderness. .  Lungs:  Clear to auscultation bilaterally. Normal respiratory effort.  Heart:  Regular rate and rhythm without murmurs or gallops noted. Normal S1,S2.   Msk:  muscle bulk and strength are grossly normal.  no obvious joint swelling.  gait is normal and steady  Pulses:  dorsalis pedis intact bilat.  no carotid bruit  Extremities:  no deformity.  no ulcer on the feet.  feet are of normal color and temp. 1+ right pedal edema and trace left pedal edema.   Neurologic:  cn 2-12 grossly intact.   readily moves all 4's.   sensation is intact to touch on the feet  Skin:  normal texture and temp.  no rash.  not diaphoretic  Cervical Nodes:  No significant adenopathy.  Psych:  Alert and cooperative; normal mood and affect; normal attention span and concentration.     Impression & Recommendations:  Problem # 1:  DIAB W/O COMP TYPE II/UNS NOT STATED UNCNTRL (ICD-250.00) needs increased  rx  Problem # 2:  leg pain prob neuropathic  Problem # 3:  DEPRESSION (ICD-311) this complicates the rx of #1  Medications Added to Medication List This Visit: 1)  Lantus 100 Unit/ml Soln (Insulin glargine) .... Take 100 units every evening.  Other Orders: Consultation Level IV ([address])  Patient Instructions: 1)  we discussed the importance of diet and exercise therapy and the risks of diabetes.  you should see an eye doctor every year. 2)  it is very important to keep good control of blood pressure and cholesterol, especially in those with diabetes.  stopping smoking also reduces the damage diabetes does to your body.  please discuss these with your doctor.  you should take an aspirin every day, unless you have been advised by a doctor not to. 3)  we will need to take this complex situation in stages. 4)  check your blood sugar 2 times a day.  vary the time of day when you check, between before the 3 meals, and at bedtime.  also check if you have symptoms of your blood sugar being too high or too low.  please keep a record of the readings and bring it to your next appointment here.  please call us sooner if you are having low blood sugar episodes. 5)  stop symlin and metformin. 6)  increase lantus to 100 units once daily.  then increase to 120 units once daily, if your blood sugar stays over 200. 7)  return 2 weeks. Prescriptions: LANTUS 100 UNIT/ML SOLN (INSULIN GLARGINE) Take 100 units every evening.  #4 vials x 11   Entered and Authorized by:   Minus Breeding MD   Signed by:   Minus Breeding MD on 09/16/2009   Method used:   Electronically to        [facility] [id number] (retail)       [address] [place], [place]  [address]       Ph: [phone]       Fax: [phone]   RxID:   [id number]

## 2010-07-22 NOTE — Assessment & Plan Note (Signed)
Summary: FU ACHILLES AND LOOK AT TOE/MJD   Vital Signs:  Patient profile:   60 year old female Height:      64 inches Weight:      275 pounds BMI:     47.37 BP sitting:   152 / 87  History of Present Illness:   Pt presents for follow up of right achilles tendinitis. She is actually having difficulty with her toenails in addition to persistent pain in her right achilles tendon. She has not been doing the home eccent exercise program but has been using a heel wedge since her last visit which has helped. She does use the nitroglycerin patches regularly which she also believes are really helping. She is 50% better now. She is requesting a refill of her vicodin which she got from [place] previously. She is also looking to get a new pair of diabetic shoes this year.    She also is having pain with a right ingrown 1st toenail as well as with a callus below her left 2nd toe. These have now been bothering her for about 3-4 months. She is a diabetic and has had a podiatrist in the past but has not been to see him recently because she thought that the sports medicine clinic specialized in all types of foot care.   Allergies (verified): 1)  ! Pcn 2)  ! Ace Inhibitors  Physical Exam  General:  alert and well-developed.   Head:  normocephalic and atraumatic.   Msk:  Bilateral feet: Pes planus and transverse arch breakdown Hammer toes right 3rd-5th toes Thickened right 2nd toenail with fungal infection Mild skin cracking of medial right heel without erythema or signs of infection +TTP of the right distal achilles about 1 cm up from its insertion point. No TTP of the left achilles tendon. No TTP of the bilateral plantar fascia Callus on the plantar surface below the left 2nd metatarsal Walks with a significant limp.  Additional Exam:  U/S of the right achilles tendon showed a thickness of 1.09cm, which is thicker than her last U/S.  Calcifications noted in right achilles tendon along with large neovessels  and small tears. The thickness of her left achilles tendon was 0.5cm today. Images saved for documentation.   Impression & Recommendations:  Problem # 1:  ACHILLES TENDINITIS (ICD-726.71) Assessment Deteriorated  The right achilles tendinitis has now gotten worse. 1. Reminded how to do eccentric exercises and to ice her foot for 5-10 minutes twice a day. 2. Given new cushioned orthotic insoles (CO for women) which are not sports insoles. She used these in addition to scaphoid pads which were helpful and made her feel more comfortable walking before she left the office. 3. She was referred to [facility] for her podiatry needs including the ingrown toenail and regular foot maintenance.  4. Will see her back in 4 weeks to make sure that she is getting better. 5. Encouraged checking her feet daily. 6. Continue nitroglycerin patches. Given a refill of her patches today. 7. Given a refill of her vicodin but with fewer tablets as this will only mask her pain and not improve her achilles tendon.   Orders: US EXTREMITY NON-VASC REAL-TIME IMG ([address])  Problem # 2:  FOOT PAIN (ICD-729.5) Assessment: Deteriorated  See above for treatment plan. Will refer to [facility] for podiatry needs.   Orders: US EXTREMITY NON-VASC REAL-TIME IMG ([address])  Complete Medication List: 1)  Metoclopramide Hcl 10 Mg Tabs (Metoclopramide hcl) .Marland Kitchen.. 10 mg  q hs 2)  Nexium 40 Mg Cpdr (Esomeprazole magnesium) .... Take 1 tablet by mouth two times a day 3)  Nitroglycerin 0.2 Mg/hr Pt24 (Nitroglycerin) .... Apply 1/4 patch to achilles tendon once daily. 4)  Vicodin 5-500 Mg Tabs (Hydrocodone-acetaminophen) .... Take one tab qid as needed pain 5)  Lantus 100 Unit/ml Soln (Insulin glargine) .... 60 units qhs 6)  Glucophage 1000 Mg Tabs (Metformin hcl) .Marland Kitchen.. 1 by mouth bid 7)  Symlin 600 Mcg/ml Soln (Pramlintide acetate) .Marland Kitchen.. 120 micrograms tid  Patient Instructions: 1)  PT IS TO CALL TO SCHEDULE APPT WITH  DR REGAL DUE TO PREVIOUS BALANCE. Prescriptions: NITROGLYCERIN 0.2 MG/HR PT24 (NITROGLYCERIN) Apply 1/4 patch to achilles tendon once daily.  #30 x 4   Entered by:   Lillia Pauls CMA   Authorized by:   Jannifer Rodney MD   Signed by:   Enid Baas MD on 06/25/2009   Method used:   Electronically to        [facility] [address] (retail)       [address] [place], [place]  [address]       Ph: [phone]       Fax: [phone]   RxID:   [id number] VICODIN 5-500 MG TABS (HYDROCODONE-ACETAMINOPHEN) take one tab qid as needed pain  #60 x 0   Entered by:   Lillia Pauls CMA   Authorized by:   Jannifer Rodney MD   Signed by:   Lillia Pauls CMA on 06/25/2009   Method used:   Print then Give to Patient   RxID:   [id number]

## 2010-07-22 NOTE — Assessment & Plan Note (Signed)
Summary: PER PT WAS TOLD SCHED OV--MED---STC   Vital Signs:  Patient profile:   60 year old female Height:      64.5 inches Weight:      282.25 pounds BMI:     47.87 O2 Sat:      96 % on Room air Temp:     98.3 degrees F oral Pulse rate:   90 / minute BP sitting:   114 / 70  (left arm) Cuff size:   large  Vitals Entered By: Zella Ball Ewing CMA Duncan Dull) (February 12, 2010 4:15 PM)  O2 Flow:  Room air CC: Cough and chest congestion/RE   Primary Care Provider:  Corine Shelter, MD  CC:  Cough and chest congestion/RE.  History of Present Illness: here with acute onset mild to mod 3 days fever, ST, headache, general weakness and malaise and today prod cough with greenish sputum with mild wheezing and sob, without other  CP, worsening sob, doe, wheezing, orthopnea, pnd, worsening LE edema, palps, dizziness or syncope.  Pt denies new neuro symptoms such as headache, facial or extremity weakness  Denies polydipsia, polyuria.   Problems Prior to Update: 1)  Wheezing  (ICD-786.07) 2)  Bronchitis-acute  (ICD-466.0) 3)  Chest Pain  (ICD-786.50) 4)  Dehydration  (ICD-276.51) 5)  Vitamin D Deficiency  (ICD-268.9) 6)  Preventive Health Care  (ICD-V70.0) 7)  Hip Pain, Right  (ICD-719.45) 8)  Menopausal Disorder  (ICD-627.9) 9)  COPD  (ICD-496) 10)  Anemia-nos  (ICD-285.9) 11)  Degenerative Joint Disease, Cervical Spine  (ICD-721.90) 12)  Depression  (ICD-311) 13)  Polyneuropathy  (ICD-357.9) 14)  Obesity  (ICD-278.00) 15)  Chest Pain  (ICD-786.50) 16)  Gerd  (ICD-530.81) 17)  Nausea  (ICD-787.02) 18)  Asthma  (ICD-493.90) 19)  Sleep Apnea  (ICD-780.57) 20)  Bronchitis  (ICD-490) 21)  Hypertension  (ICD-401.9) 22)  Back Pain  (ICD-724.5) 23)  Diab w/o Comp Type Ii/uns Not Stated Uncntrl  (ICD-250.00) 24)  Sinus Tarsi Syndrome  (ICD-726.79) 25)  Achilles Tendinitis  (ICD-726.71) 26)  Foot Pain  (ICD-729.5)  Medications Prior to Update: 1)  Nexium 40 Mg Cpdr (Esomeprazole Magnesium)  .... Take 1 Tablet By Mouth Two Times A Day 2)  Nitroglycerin 0.2 Mg/hr Pt24 (Nitroglycerin) .... Apply 1/4 Patch To Achilles Tendon Once Daily. 3)  Lantus 100 Unit/ml Soln (Insulin Glargine) .... Take 100 Units Every Evening. 4)  Advair Diskus 100-50 Mcg/dose Aepb (Fluticasone-Salmeterol) .... Take 2 Puffs Two Times A Day 5)  Tramadol Hcl 50 Mg Tabs (Tramadol Hcl) .... Take 1 Tablet By Mouth Four Times Daily 6)  Vesicare 10 Mg Tabs (Solifenacin Succinate) .... Take 1 Tablet By Mouth Once A Day 7)  Multivitamins  Tabs (Multiple Vitamin) .... Take 1 Tablet By Mouth Once A Day 8)  Vitamin D 1000 Unit Tabs (Cholecalciferol) .... Take 2 Tablets By Mouth Once Daily 9)  Metoprolol Tartrate 50 Mg Tabs (Metoprolol Tartrate) .... Take 1 1/2 Tablet Daily 10)  Promethazine Hcl 25 Mg Tabs (Promethazine Hcl) .... Take 1-2 Tablets Every 4-6 Hours As Needed For Nausea 11)  Maalox Advanced Max St 400-400-40 Mg/42ml Susp (Alum & Mag Hydroxide-Simeth) .... "turns The Bottle Up and Drinks" Three Times Daily 12)  Furosemide 40 Mg Tabs (Furosemide) .... Take 1 Tablet By Mouth Once A Day 13)  Diovan 320 Mg Tabs (Valsartan) .... Take 1 Tablet By Mouth Once A Day 14)  Aspirin 81 Mg Tbec (Aspirin) .... Take 1 Tablet By Mouth Once A Day 15)  Meperidine  Hcl 50 Mg Tabs (Meperidine Hcl) .... Take 1 Tablet By Mouth Eveyr 6 Hours As Needed 16)  Promethazine-Codeine 6.25-10 Mg/43ml Syrp (Promethazine-Codeine) .... Take 1 Teaspoonful Every Four Hours As Needed For Cough 17)  Neurontin 600 Mg Tabs (Gabapentin) .... Take 1 Tablet By Mouth Three Times A Day 18)  Clindamycin Hcl 150 Mg Caps (Clindamycin Hcl) .... Take 4 Capsules By Mouth 1 Hour Before Dental Procedures 19)  Klor-Con 10 10 Meq Cr-Tabs (Potassium Chloride) .... Take 1 Tablet By Mouth Two Times A Day 20)  Tylenol With Codeine #4 300-60 Mg Tabs (Acetaminophen-Codeine) .... Take 1 Tablet Every 6 Hours As Needed - To Fill Sept 1, 2011 21)  Diflucan 150 Mg Tabs (Fluconazole)  .... Take 1 Tablet By Mouth As Needed Yeast Infection 22)  Albuterol Breathing Treatment .... Use As Needed 23)  Oxygen 2l .... At Bedtime 24)  Nexium 40 Mg Cpdr (Esomeprazole Magnesium) .... Take 1 Cap Twice Daily 25)  Humalog Kwikpen 100 Unit/ml Soln (Insulin Lispro (Human)) .... Use Asd Ssi With Checking Blood Sugars Four Times Per Day 26)  Humalog Kwikpen Needles .... Use Asd Four Times Per Day 27)  Advair Diskus 100-50 Mcg/dose Aepb (Fluticasone-Salmeterol) .Marland Kitchen.. 1 Puff Two Times A Day 28)  Omeprazole 20 Mg Cpdr (Omeprazole) .Marland Kitchen.. 1 By Mouth Bid  Current Medications (verified): 1)  Nexium 40 Mg Cpdr (Esomeprazole Magnesium) .... Take 1 Tablet By Mouth Two Times A Day 2)  Nitroglycerin 0.2 Mg/hr Pt24 (Nitroglycerin) .... Apply 1/4 Patch To Achilles Tendon Once Daily. 3)  Lantus 100 Unit/ml Soln (Insulin Glargine) .... Take 100 Units Every Evening. 4)  Advair Diskus 100-50 Mcg/dose Aepb (Fluticasone-Salmeterol) .... Take 2 Puffs Two Times A Day 5)  Tramadol Hcl 50 Mg Tabs (Tramadol Hcl) .... Take 1 Tablet By Mouth Four Times Daily 6)  Vesicare 10 Mg Tabs (Solifenacin Succinate) .... Take 1 Tablet By Mouth Once A Day 7)  Multivitamins  Tabs (Multiple Vitamin) .... Take 1 Tablet By Mouth Once A Day 8)  Vitamin D 1000 Unit Tabs (Cholecalciferol) .... Take 2 Tablets By Mouth Once Daily 9)  Metoprolol Tartrate 50 Mg Tabs (Metoprolol Tartrate) .... Take 1 1/2 Tablet Daily 10)  Promethazine Hcl 25 Mg Tabs (Promethazine Hcl) .... Take 1-2 Tablets Every 4-6 Hours As Needed For Nausea 11)  Maalox Advanced Max St 400-400-40 Mg/67ml Susp (Alum & Mag Hydroxide-Simeth) .... "turns The Bottle Up and Drinks" Three Times Daily 12)  Furosemide 40 Mg Tabs (Furosemide) .... Take 1 Tablet By Mouth Once A Day 13)  Diovan 320 Mg Tabs (Valsartan) .... Take 1 Tablet By Mouth Once A Day 14)  Aspirin 81 Mg Tbec (Aspirin) .... Take 1 Tablet By Mouth Once A Day 15)  Meperidine Hcl 50 Mg Tabs (Meperidine Hcl) .... Take 1  Tablet By Mouth Eveyr 6 Hours As Needed 16)  Promethazine-Codeine 6.25-10 Mg/11ml Syrp (Promethazine-Codeine) .... Take 1 Teaspoonful Every Four Hours As Needed For Cough 17)  Neurontin 600 Mg Tabs (Gabapentin) .... Take 1 Tablet By Mouth Three Times A Day 18)  Clindamycin Hcl 150 Mg Caps (Clindamycin Hcl) .... Take 4 Capsules By Mouth 1 Hour Before Dental Procedures 19)  Klor-Con 10 10 Meq Cr-Tabs (Potassium Chloride) .... Take 1 Tablet By Mouth Two Times A Day 20)  Tylenol With Codeine #4 300-60 Mg Tabs (Acetaminophen-Codeine) .... Take 1 Tablet Every 6 Hours As Needed - To Fill Sept 1, 2011 21)  Albuterol Breathing Treatment .... Use As Needed 22)  Oxygen 2l .Marland KitchenMarland KitchenMarland Kitchen  At Bedtime 23)  Nexium 40 Mg Cpdr (Esomeprazole Magnesium) .... Take 1 Cap Twice Daily 24)  Humalog Kwikpen 100 Unit/ml Soln (Insulin Lispro (Human)) .... Use Asd Ssi With Checking Blood Sugars Four Times Per Day 25)  Humalog Kwikpen Needles .... Use Asd Four Times Per Day 26)  Advair Diskus 100-50 Mcg/dose Aepb (Fluticasone-Salmeterol) .Marland Kitchen.. 1 Puff Two Times A Day 27)  Omeprazole 20 Mg Cpdr (Omeprazole) .Marland Kitchen.. 1 By Mouth Bid 28)  Azithromycin 250 Mg Tabs (Azithromycin) .... 2po Qd For 1 Day, Then 1po Qd For 4days, Then Stop 29)  Promethazine-Codeine 6.25-10 Mg/44ml Syrp (Promethazine-Codeine) .Marland Kitchen.. 1 Tsp By Mouth Q 6 Hrs Prn Cough 30)  Prednisone 10 Mg Tabs (Prednisone) .Marland Kitchen.. 1po Once Daily For 7 Days 31)  Fluconazole 150 Mg Tabs (Fluconazole) .Marland Kitchen.. 1po Q 3 Days As Needed Yeast Infection  Allergies (verified): 1)  ! Pcn 2)  ! Ace Inhibitors 3)  ! * Tape  Past History:  Past Medical History: Last updated: 11/22/2009 GERD (ICD-530.81) OBESITY ASTHMA (ICD-493.90) SLEEP APNEA (ICD-780.57) BRONCHITIS (ICD-490) HYPERTENSION (ICD-401.9) BACK PAIN (ICD-724.5) DIAB W/O COMP TYPE II/UNS NOT STATED UNCNTRL (ICD-250.00) - Dr Everardo All SINUS TARSI SYNDROME (ICD-726.79) - Dr Darrick Penna - podiatry ACHILLES TENDINITIS (ICD-726.71) FOOT PAIN  (ICD-729.5) C-spine DJD - severe hx of dysphagia with severe dysmotility by barium swallow 2007 - Dr Lina Sar Anemia-NOS COPD  cardiology - Dr Julieanne Manson pulmonary - Dr Corine Shelter  Past Surgical History: Last updated: 11/22/2009 Laser Surgery-both eyes tonsillectomy back surgery-fusion  - mult  cervical spine levels - Dr Lovell Sheehan exision of abcess??? -chest Cholecystectomy D & C Exploratory laparotomy?? Partial Hysterectomy knee replacement-left - 2005 knee replacement-right - 2005 - Dr Turner Daniels foot surgery-right x 2  Social History: Last updated: 11/22/2009 Patient is a former smoker. -stopped 1988 Alcohol Use - no Illicit Drug Use - no Patient does not get regular exercise.  widowed 2004 disabled former work - Personnel officer  Risk Factors: Exercise: no (09/09/2009)  Risk Factors: Smoking Status: quit (09/09/2009)  Review of Systems       all otherwise negative per pt -    Physical Exam  General:  alert and overweight-appearing.  , mild ill  Head:  normocephalic and atraumatic.   Eyes:  vision grossly intact, pupils equal, and pupils round.   Ears:  bilat tm's mild red, sinus nontender Nose:  nasal dischargemucosal pallor and mucosal edema.   Mouth:  pharyngeal erythema and fair dentition.   Neck:  supple and no masses.   Lungs:  normal respiratory effort, R decreased breath sounds, R wheezes, L decreased breath sounds, and L wheezes.   Heart:  normal rate and regular rhythm.   Extremities:  no edema, no erythema    Impression & Recommendations:  Problem # 1:  BRONCHITIS-ACUTE (ICD-466.0)  Her updated medication list for this problem includes:    Advair Diskus 100-50 Mcg/dose Aepb (Fluticasone-salmeterol) .Marland Kitchen... Take 2 puffs two times a day    Promethazine-codeine 6.25-10 Mg/37ml Syrp (Promethazine-codeine) .Marland Kitchen... Take 1 teaspoonful every four hours as needed for cough    Clindamycin Hcl 150 Mg Caps (Clindamycin hcl) .Marland Kitchen... Take 4 capsules by  mouth 1 hour before dental procedures    Advair Diskus 100-50 Mcg/dose Aepb (Fluticasone-salmeterol) .Marland Kitchen... 1 puff two times a day    Azithromycin 250 Mg Tabs (Azithromycin) .Marland Kitchen... 2po qd for 1 day, then 1po qd for 4days, then stop    Promethazine-codeine 6.25-10 Mg/82ml Syrp (Promethazine-codeine) .Marland Kitchen... 1 tsp by mouth q 6 hrs prn  cough treat as above, f/u any worsening signs or symptoms   Problem # 2:  WHEEZING (ICD-786.07) mild, likely due to above, for pred pack by mouth   Problem # 3:  HYPERTENSION (ICD-401.9)  Her updated medication list for this problem includes:    Metoprolol Tartrate 50 Mg Tabs (Metoprolol tartrate) .Marland Kitchen... Take 1 1/2 tablet daily    Furosemide 40 Mg Tabs (Furosemide) .Marland Kitchen... Take 1 tablet by mouth once a day    Diovan 320 Mg Tabs (Valsartan) .Marland Kitchen... Take 1 tablet by mouth once a day  BP today: 114/70 Prior BP: 132/82 (01/31/2010)  Labs Reviewed: K+: 4.7 (11/22/2009) Creat: : 1.0 (11/22/2009)   Chol: 183 (11/22/2009)   HDL: 50.10 (11/22/2009)   LDL: 113 (11/22/2009)   TG: 100.0 (11/22/2009) stable overall by hx and exam, ok to continue meds/tx as is   Complete Medication List: 1)  Nexium 40 Mg Cpdr (Esomeprazole magnesium) .... Take 1 tablet by mouth two times a day 2)  Nitroglycerin 0.2 Mg/hr Pt24 (Nitroglycerin) .... Apply 1/4 patch to achilles tendon once daily. 3)  Lantus 100 Unit/ml Soln (Insulin glargine) .... Take 100 units every evening. 4)  Advair Diskus 100-50 Mcg/dose Aepb (Fluticasone-salmeterol) .... Take 2 puffs two times a day 5)  Tramadol Hcl 50 Mg Tabs (Tramadol hcl) .... Take 1 tablet by mouth four times daily 6)  Vesicare 10 Mg Tabs (Solifenacin succinate) .... Take 1 tablet by mouth once a day 7)  Multivitamins Tabs (Multiple vitamin) .... Take 1 tablet by mouth once a day 8)  Vitamin D 1000 Unit Tabs (Cholecalciferol) .... Take 2 tablets by mouth once daily 9)  Metoprolol Tartrate 50 Mg Tabs (Metoprolol tartrate) .... Take 1 1/2 tablet daily 10)   Promethazine Hcl 25 Mg Tabs (Promethazine hcl) .... Take 1-2 tablets every 4-6 hours as needed for nausea 11)  Maalox Advanced Max St 400-400-40 Mg/35ml Susp (Alum & mag hydroxide-simeth) .... "turns the bottle up and drinks" three times daily 12)  Furosemide 40 Mg Tabs (Furosemide) .... Take 1 tablet by mouth once a day 13)  Diovan 320 Mg Tabs (Valsartan) .... Take 1 tablet by mouth once a day 14)  Aspirin 81 Mg Tbec (Aspirin) .... Take 1 tablet by mouth once a day 15)  Meperidine Hcl 50 Mg Tabs (Meperidine hcl) .... Take 1 tablet by mouth eveyr 6 hours as needed 16)  Promethazine-codeine 6.25-10 Mg/15ml Syrp (Promethazine-codeine) .... Take 1 teaspoonful every four hours as needed for cough 17)  Neurontin 600 Mg Tabs (Gabapentin) .... Take 1 tablet by mouth three times a day 18)  Clindamycin Hcl 150 Mg Caps (Clindamycin hcl) .... Take 4 capsules by mouth 1 hour before dental procedures 19)  Klor-con 10 10 Meq Cr-tabs (Potassium chloride) .... Take 1 tablet by mouth two times a day 20)  Tylenol With Codeine #4 300-60 Mg Tabs (Acetaminophen-codeine) .... Take 1 tablet every 6 hours as needed - to fill sept 1, 2011 21)  Albuterol Breathing Treatment  .... Use as needed 22)  Oxygen 2l  .... At bedtime 23)  Nexium 40 Mg Cpdr (Esomeprazole magnesium) .... Take 1 cap twice daily 24)  Humalog Kwikpen 100 Unit/ml Soln (Insulin lispro (human)) .... Use asd ssi with checking blood sugars four times per day 25)  Humalog Kwikpen Needles  .... Use asd four times per day 26)  Advair Diskus 100-50 Mcg/dose Aepb (Fluticasone-salmeterol) .Marland Kitchen.. 1 puff two times a day 27)  Omeprazole 20 Mg Cpdr (Omeprazole) .Marland Kitchen.. 1 by mouth bid  28)  Azithromycin 250 Mg Tabs (Azithromycin) .... 2po qd for 1 day, then 1po qd for 4days, then stop 29)  Promethazine-codeine 6.25-10 Mg/55ml Syrp (Promethazine-codeine) .Marland Kitchen.. 1 tsp by mouth q 6 hrs prn cough 30)  Prednisone 10 Mg Tabs (Prednisone) .Marland Kitchen.. 1po once daily for 7 days 31)   Fluconazole 150 Mg Tabs (Fluconazole) .Marland Kitchen.. 1po q 3 days as needed yeast infection  Patient Instructions: 1)  Please take all new medications as prescribed 2)  Continue all previous medications as before this visit  3)  Please schedule a follow-up appointment in 3 months with: 4)  BMP prior to visit, ICD-9: 250.02 5)  Lipid Panel prior to visit, ICD-9: 6)  HbgA1C prior to visit, ICD-9: 7)     (office will call with the appt) Prescriptions: FLUCONAZOLE 150 MG TABS (FLUCONAZOLE) 1po q 3 days as needed yeast infection  #2 x 1   Entered and Authorized by:   Corwin Levins MD   Signed by:   Corwin Levins MD on 02/12/2010   Method used:   Print then Give to Patient   RxID:   [id number] PREDNISONE 10 MG TABS (PREDNISONE) 1po once daily for 7 days  #7 x 0   Entered and Authorized by:   Corwin Levins MD   Signed by:   Corwin Levins MD on 02/12/2010   Method used:   Print then Give to Patient   RxID:   [id number] PROMETHAZINE-CODEINE 6.25-10 MG/5ML SYRP (PROMETHAZINE-CODEINE) 1 tsp by mouth q 6 hrs prn cough  #6 oz x 1   Entered and Authorized by:   Corwin Levins MD   Signed by:   Corwin Levins MD on 02/12/2010   Method used:   Print then Give to Patient   RxID:   [id number] AZITHROMYCIN 250 MG TABS (AZITHROMYCIN) 2po qd for 1 day, then 1po qd for 4days, then stop  #6 x 1   Entered and Authorized by:   Corwin Levins MD   Signed by:   Corwin Levins MD on 02/12/2010   Method used:   Print then Give to Patient   RxID:   [id number]

## 2010-07-22 NOTE — Progress Notes (Signed)
Summary: medication refill  Phone Note Refill Request Message from:  Fax from Pharmacy on May 01, 2010 2:35 PM  Refills Requested: Medication #1:  TYLENOL WITH CODEINE #4 300-60 MG TABS Take 1 tablet every 6 hours as needed - to fill sept 1   Dosage confirmed as above?Dosage Confirmed   Last Refilled: 02/04/2010   Notes: [facility] [place]. [phone] Initial call taken by: Zella Ball Ewing CMA Duncan Dull),  May 01, 2010 2:36 PM    New/Updated Medications: TYLENOL WITH CODEINE #4 300-60 MG TABS (ACETAMINOPHEN-CODEINE) Take 1 tablet every 6 hours as needed - to fill May 01, 2010 Prescriptions: TYLENOL WITH CODEINE #4 300-60 MG TABS (ACETAMINOPHEN-CODEINE) Take 1 tablet every 6 hours as needed - to fill May 01, 2010  #60 x 2   Entered and Authorized by:   Corwin Levins MD   Signed by:   Corwin Levins MD on 05/01/2010   Method used:   Print then Give to Patient   RxID:   [id number]  done hardcopy to [facility] B - dahlia  Corwin Levins MD  May 01, 2010 5:08 PM   Rx faxed to pharmacy Margaret Pyle, CMA  May 02, 2010 7:58 AM

## 2010-07-22 NOTE — Assessment & Plan Note (Signed)
Summary: GERD, CHEST PAIN   (DR.BRODIE PT.)           Christy May    History of Present Illness Visit Type: Follow-up Visit Primary GI MD: Lina Sar MD Primary Provider: Corine Shelter, MD Chief Complaint: Patient complains of 1 week epigastric pain and midsternal chest pain. She denies any nausea or vomiting but does complain of dysphagia to solids and fluids. She denies any increased belching or bloating. She does c/o constipation but denies any blood in the stool. History of Present Illness:   60  FEMALE KNOWN TO DR. Juanda Chance WITH HX OF CHRONIC GERD,AND ESOPHAGEAL DYSMOTILITY. SHE HAS HAD C/O CHEST PAIN IN THE PAST (2009), AND NEGATIVE CARDIAC WORKUP WITH STRESS TESTING. SHE COMES IN TODAY WITH C/O SUBXYPHOID PAIN WORSE OVER THE PAST WEEK.SHE DESRIBES IT A SHARP PRESSURE,WORSE WITH CERTAIN POSITIONS.THE PAIN COMES AND GOES THRU THE DAY, NO CHANGE WITH by mouth INTAKE. NO N/V.SHE FELL ABOUT A WEEK AGO  BUT SAYS WAS HAVING SXS PRIOR TO THAT.. SHE IS ON NEXIUM 40 MG TWICE DAILY,HAS BEEN USING MAALOX FREQUENTLY BUT NOT SURE IT MAKES ANY DIFFERENCE.NO DYSPHAGIA .SHE TENDS TOWARD CONSTIPATION,NO RECENT CHANGE.   GI Review of Systems    Reports abdominal pain, acid reflux, and  chest pain.     Location of  Abdominal pain: epigastric area.    Denies belching, bloating, dysphagia with liquids, dysphagia with solids, heartburn, loss of appetite, nausea, vomiting, vomiting blood, and  weight loss.      Reports constipation.     Denies anal fissure, black tarry stools, change in bowel habit, diarrhea, diverticulosis, fecal incontinence, heme positive stool, hemorrhoids, irritable bowel syndrome, jaundice, light color stool, liver problems, rectal bleeding, and  rectal pain. Preventive Screening-Counseling & Management  Alcohol-Tobacco     Smoking Status: quit  Caffeine-Diet-Exercise     Does Patient Exercise: no      Drug Use:  no.      Current Medications (verified): 1)  Nexium 40 Mg Cpdr  (Esomeprazole Magnesium) .... Take 1 Tablet By Mouth Two Times A Day 2)  Nitroglycerin 0.2 Mg/hr Pt24 (Nitroglycerin) .... Apply 1/4 Patch To Achilles Tendon Once Daily. 3)  Lantus 100 Unit/ml Soln (Insulin Glargine) .... Take 40 Units Subcutaneously Every Morning and 40 Units Every Evening. 4)  Glucophage 1000 Mg Tabs (Metformin Hcl) .Marland Kitchen.. 1 By Mouth Bid 5)  Symlin 600 Mcg/ml Soln (Pramlintide Acetate) .Marland Kitchen.. 120 Micrograms Tid 6)  Advair Diskus 100-50 Mcg/dose Aepb (Fluticasone-Salmeterol) .... Take 2 Puffs Two Times A Day 7)  Tramadol Hcl 50 Mg Tabs (Tramadol Hcl) .... Take 1 Tablet By Mouth Four Times Daily 8)  Vesicare 10 Mg Tabs (Solifenacin Succinate) .... Take 1 Tablet By Mouth Once A Day 9)  Multivitamins  Tabs (Multiple Vitamin) .... Take 1 Tablet By Mouth Once A Day 10)  Vitamin D 1000 Unit Tabs (Cholecalciferol) .... Take 2 Tablets By Mouth Once Daily 11)  Metoprolol Tartrate 50 Mg Tabs (Metoprolol Tartrate) .... Take 1 1/2 Tablet Daily 12)  Promethazine Hcl 25 Mg Tabs (Promethazine Hcl) .... Take 1-2 Tablets Every 4-6 Hours As Needed For Nausea 13)  Maalox Advanced Max St 400-400-40 Mg/20ml Susp (Alum & Mag Hydroxide-Simeth) .... "turns The Bottle Up and Drinks" Three Times Daily 14)  Furosemide 40 Mg Tabs (Furosemide) .... Take 1 Tablet By Mouth Once A Day 15)  Diovan 320 Mg Tabs (Valsartan) .... Take 1 Tablet By Mouth Once A Day 16)  Aspirin 81 Mg Tbec (Aspirin) .... Take 1  Tablet By Mouth Once A Day 17)  Meperidine Hcl 50 Mg Tabs (Meperidine Hcl) .... Take 1 Tablet By Mouth Eveyr 6 Hours As Needed 18)  Promethazine-Codeine 6.25-10 Mg/85ml Syrp (Promethazine-Codeine) .... Take 1 Teaspoonful Every Four Hours As Needed For Cough 19)  Neurontin 600 Mg Tabs (Gabapentin) .... Take 1 Tablet By Mouth Three Times A Day 20)  Clindamycin Hcl 150 Mg Caps (Clindamycin Hcl) .... Take 4 Capsules By Mouth 1 Hour Before Dental Procedures 21)  Klor-Con 10 10 Meq Cr-Tabs (Potassium Chloride) .... Take 1  Tablet By Mouth Two Times A Day 22)  Tylenol With Codeine #4 300-60 Mg Tabs (Acetaminophen-Codeine) .... Take 1 Tablet Every 6 Hours As Needed (Not Taking With Liquid Codeine) 23)  Diflucan 150 Mg Tabs (Fluconazole) .... Take 1 Tablet By Mouth As Needed Yeast Infection 24)  Albuterol Breathing Treatment .... Use As Needed 25)  Oxygen .... At Bedtime  Allergies: 1)  ! Pcn 2)  ! Ace Inhibitors 3)  ! * Tape  Past History:  Past Medical History: Current Problems:  GERD (ICD-530.81) OBESITY ASTHMA (ICD-493.90) SLEEP APNEA (ICD-780.57) BRONCHITIS (ICD-490) HYPERTENSION (ICD-401.9) BACK PAIN (ICD-724.5) DIAB W/O COMP TYPE II/UNS NOT STATED UNCNTRL (ICD-250.00) SINUS TARSI SYNDROME (ICD-726.79) ACHILLES TENDINITIS (ICD-726.71) FOOT PAIN (ICD-729.5)    Past Surgical History: Laser Surgery-both eyes tonsillectomy back surgery-fusion  exision of abcess??? -chest Cholecystectomy D & C Exploratory laparotomy?? Partial Hysterectomy knee replacement-left knee replacement-right foot surgery-right x 2   Family History: No FH of Colon Cancer: Family History of Esophageal Cancer:Sister?, Brother Family History of Colon Polyps:Brother Family History of Pancreatic Cancer: Sister? Family History of Diabetes: Sister, Celine Ahr, Uncle Family History of Heart Disease: MGF  Social History: Patient is a former smoker. -stopped 14 Alcohol Use - no Illicit Drug Use - no Patient does not get regular exercise.  Smoking Status:  quit Drug Use:  no Does Patient Exercise:  no  Review of Systems       The patient complains of allergy/sinus, arthritis/joint pain, back pain, breast changes/lumps, change in vision, confusion, cough, depression-new, headaches-new, muscle pains/cramps, shortness of breath, swelling of feet/legs, thirst - excessive, urination - excessive, and urine leakage.  The patient denies anemia, anxiety-new, blood in urine, coughing up blood, fainting, fatigue, fever, hearing  problems, heart murmur, heart rhythm changes, itching, menstrual pain, night sweats, nosebleeds, pregnancy symptoms, skin rash, sleeping problems, sore throat, swollen lymph glands, thirst - excessive , urination - excessive , urination changes/pain, vision changes, and voice change.         ROS OTHERWISE AS IN HPI  Vital Signs:  Patient profile:   60 year old female Height:      64 inches Weight:      279 pounds BMI:     48.06 BSA:     2.26 Pulse rate:   88 / minute Pulse rhythm:   regular BP sitting:   112 / 70  (left arm) Cuff size:   large  Vitals Entered By: Hortense Ramal CMA Duncan Dull) (September 09, 2009 3:27 PM)  Physical Exam  General:  Well developed, well nourished, no acute distress.obese.   Head:  Normocephalic and atraumatic. Eyes:  PERRLA, no icterus. Chest Wall:  EXQUISITELY TENDER OVER XYPHOID PROCESS, AND ANTERIOR LOWER RIBS  WITH LIGHT PALPATION Lungs:  Clear throughout to auscultation. Heart:  Regular rate and rhythm; no murmurs, rubs,  or bruits. Abdomen:  LARGE,SOFT, NONTENDER,NO MASS OR HSM,BS+,RUQ SCAR Rectal:  NOT DONE Extremities:  ANKLE BRACE RIGHT ANKLE Neurologic:  Alert and  oriented x4;  grossly normal neurologically. Psych:  Alert and cooperative. Normal mood and affect.   Impression & Recommendations:  Problem # 1:  CHEST PAIN (ICD-786.50) Assessment Deteriorated 60 YO FEMALE WITH  ONE WEEK HX OF CHEST PAIN WHICH ON EXAM IS CLEARLY MUSCULOSKELETAL/COSTOCHONDRITIS PAIN.  REASSURANCE HEATING PAD,2-3 X DAILY FOR 15-20 MINUTES TRIAL OF ALEVE ONE TWICE DAILY WITH FOOD FOR 2-3 WEEKS  Problem # 2:  GERD (ICD-530.81) Assessment: Unchanged I BELIEVE HER GERD IS STABLE,CONTINUE NEXIUM 40 MG TWICE DAILY STOP MAALOX  Problem # 3:  OBESITY (ICD-278.00) Assessment: Comment Only  Problem # 4:  HYPERTENSION (ICD-401.9) Assessment: Comment Only  Problem # 5:  DIAB W/O COMP TYPE II/UNS NOT STATED UNCNTRL (ICD-250.00) Assessment: Comment Only  Patient  Instructions: 1)  Continue Nexium twice daily, samples provided. 2)  Take Aleve OTC twice daily. 3)  Heat 2-3 times daily ( Abdomen area).  4)  Follow up with Dr. Juanda Chance as needed.  5)  We got you an appt wit Dr. Romero Belling for 09-16-09 at 2:30PM.  for your Endochronology.  6)  We got you an appt with Dr. Jonny Ruiz for your Primary Care.  7)  Please stop by the Primary Care office on your way out today for your new patient paperwork. 8)  The medication list was reviewed and reconciled.  All changed / newly prescribed medications were explained.  A complete medication list was provided to the patient / caregiver.

## 2010-07-22 NOTE — Assessment & Plan Note (Signed)
Summary: new / medicare / # / cd   Vital Signs:  Patient profile:   60 year old female Height:      64.5 inches Weight:      277.75 pounds BMI:     47.11 O2 Sat:      95 % on Room air Temp:     98.8 degrees F oral Pulse rate:   77 / minute BP sitting:   120 / 72  (left arm) Cuff size:   large  Vitals Entered ByZella Ball Ewing (November 22, 2009 1:14 PM)  O2 Flow:  Room air  Preventive Care Screening  Colonoscopy:    Date:  02/08/2006    Next Due:  02/2016    Results:  normal      declines flu shots in the future  CC: New Pt, New medicare/RE   Primary Care Provider:  Corine Shelter, MD  CC:  New Pt and New medicare/RE.  History of Present Illness: here with pain to the right lateral hip area  x 2 days, severe and intense per pt; limps to walk and hard to hold on with the arms,  just ot back from [facility] returned may 29, denies falls or increased walking while there;  hurts worse to lie on the right side;  no fever, bruising, sweling or redness, no known hx of speciifc hip DJD but " I have arthritis" .  Pt denies CP, sob, doe, wheezing, orthopnea, pnd, worsening LE edema, palps, dizziness or syncope  Pt denies new neuro symptoms such as headache, facial or extremity weakness   Problems Prior to Update: 1)  Vitamin D Deficiency  (ICD-268.9) 2)  Preventive Health Care  (ICD-V70.0) 3)  Hip Pain, Right  (ICD-719.45) 4)  Menopausal Disorder  (ICD-627.9) 5)  COPD  (ICD-496) 6)  Anemia-nos  (ICD-285.9) 7)  Degenerative Joint Disease, Cervical Spine  (ICD-721.90) 8)  Depression  (ICD-311) 9)  Polyneuropathy  (ICD-357.9) 10)  Obesity  (ICD-278.00) 11)  Chest Pain  (ICD-786.50) 12)  Gerd  (ICD-530.81) 13)  Nausea  (ICD-787.02) 14)  Asthma  (ICD-493.90) 15)  Sleep Apnea  (ICD-780.57) 16)  Bronchitis  (ICD-490) 17)  Hypertension  (ICD-401.9) 18)  Back Pain  (ICD-724.5) 19)  Diab w/o Comp Type Ii/uns Not Stated Uncntrl  (ICD-250.00) 20)  Sinus Tarsi Syndrome   (ICD-726.79) 21)  Achilles Tendinitis  (ICD-726.71) 22)  Foot Pain  (ICD-729.5)  Medications Prior to Update: 1)  Nexium 40 Mg Cpdr (Esomeprazole Magnesium) .... Take 1 Tablet By Mouth Two Times A Day 2)  Nitroglycerin 0.2 Mg/hr Pt24 (Nitroglycerin) .... Apply 1/4 Patch To Achilles Tendon Once Daily. 3)  Lantus 100 Unit/ml Soln (Insulin Glargine) .... Take 100 Units Every Evening. 4)  Advair Diskus 100-50 Mcg/dose Aepb (Fluticasone-Salmeterol) .... Take 2 Puffs Two Times A Day 5)  Tramadol Hcl 50 Mg Tabs (Tramadol Hcl) .... Take 1 Tablet By Mouth Four Times Daily 6)  Vesicare 10 Mg Tabs (Solifenacin Succinate) .... Take 1 Tablet By Mouth Once A Day 7)  Multivitamins  Tabs (Multiple Vitamin) .... Take 1 Tablet By Mouth Once A Day 8)  Vitamin D 1000 Unit Tabs (Cholecalciferol) .... Take 2 Tablets By Mouth Once Daily 9)  Metoprolol Tartrate 50 Mg Tabs (Metoprolol Tartrate) .... Take 1 1/2 Tablet Daily 10)  Promethazine Hcl 25 Mg Tabs (Promethazine Hcl) .... Take 1-2 Tablets Every 4-6 Hours As Needed For Nausea 11)  Maalox Advanced Max St 400-400-40 Mg/54ml Susp (Alum & Mag Hydroxide-Simeth) .... "turns  The Bottle Up and Drinks" Three Times Daily 12)  Furosemide 40 Mg Tabs (Furosemide) .... Take 1 Tablet By Mouth Once A Day 13)  Diovan 320 Mg Tabs (Valsartan) .... Take 1 Tablet By Mouth Once A Day 14)  Aspirin 81 Mg Tbec (Aspirin) .... Take 1 Tablet By Mouth Once A Day 15)  Meperidine Hcl 50 Mg Tabs (Meperidine Hcl) .... Take 1 Tablet By Mouth Eveyr 6 Hours As Needed 16)  Promethazine-Codeine 6.25-10 Mg/46ml Syrp (Promethazine-Codeine) .... Take 1 Teaspoonful Every Four Hours As Needed For Cough 17)  Neurontin 600 Mg Tabs (Gabapentin) .... Take 1 Tablet By Mouth Three Times A Day 18)  Clindamycin Hcl 150 Mg Caps (Clindamycin Hcl) .... Take 4 Capsules By Mouth 1 Hour Before Dental Procedures 19)  Klor-Con 10 10 Meq Cr-Tabs (Potassium Chloride) .... Take 1 Tablet By Mouth Two Times A Day 20)  Tylenol  With Codeine #4 300-60 Mg Tabs (Acetaminophen-Codeine) .... Take 1 Tablet Every 6 Hours As Needed (Not Taking With Liquid Codeine) 21)  Diflucan 150 Mg Tabs (Fluconazole) .... Take 1 Tablet By Mouth As Needed Yeast Infection 22)  Albuterol Breathing Treatment .... Use As Needed 23)  Oxygen .... At Bedtime 24)  Nexium 40 Mg Cpdr (Esomeprazole Magnesium) .... Take 1 Cap Twice Daily 25)  Humalog Kwikpen 100 Unit/ml Soln (Insulin Lispro (Human)) .... Use Asd Ssi With Checking Blood Sugars Four Times Per Day 26)  Humalog Kwikpen Needles .... Use Asd Four Times Per Day  Current Medications (verified): 1)  Nexium 40 Mg Cpdr (Esomeprazole Magnesium) .... Take 1 Tablet By Mouth Two Times A Day 2)  Nitroglycerin 0.2 Mg/hr Pt24 (Nitroglycerin) .... Apply 1/4 Patch To Achilles Tendon Once Daily. 3)  Lantus 100 Unit/ml Soln (Insulin Glargine) .... Take 100 Units Every Evening. 4)  Advair Diskus 100-50 Mcg/dose Aepb (Fluticasone-Salmeterol) .... Take 2 Puffs Two Times A Day 5)  Tramadol Hcl 50 Mg Tabs (Tramadol Hcl) .... Take 1 Tablet By Mouth Four Times Daily 6)  Vesicare 10 Mg Tabs (Solifenacin Succinate) .... Take 1 Tablet By Mouth Once A Day 7)  Multivitamins  Tabs (Multiple Vitamin) .... Take 1 Tablet By Mouth Once A Day 8)  Vitamin D 1000 Unit Tabs (Cholecalciferol) .... Take 2 Tablets By Mouth Once Daily 9)  Metoprolol Tartrate 50 Mg Tabs (Metoprolol Tartrate) .... Take 1 1/2 Tablet Daily 10)  Promethazine Hcl 25 Mg Tabs (Promethazine Hcl) .... Take 1-2 Tablets Every 4-6 Hours As Needed For Nausea 11)  Maalox Advanced Max St 400-400-40 Mg/54ml Susp (Alum & Mag Hydroxide-Simeth) .... "turns The Bottle Up and Drinks" Three Times Daily 12)  Furosemide 40 Mg Tabs (Furosemide) .... Take 1 Tablet By Mouth Once A Day 13)  Diovan 320 Mg Tabs (Valsartan) .... Take 1 Tablet By Mouth Once A Day 14)  Aspirin 81 Mg Tbec (Aspirin) .... Take 1 Tablet By Mouth Once A Day 15)  Meperidine Hcl 50 Mg Tabs (Meperidine  Hcl) .... Take 1 Tablet By Mouth Eveyr 6 Hours As Needed 16)  Promethazine-Codeine 6.25-10 Mg/36ml Syrp (Promethazine-Codeine) .... Take 1 Teaspoonful Every Four Hours As Needed For Cough 17)  Neurontin 600 Mg Tabs (Gabapentin) .... Take 1 Tablet By Mouth Three Times A Day 18)  Clindamycin Hcl 150 Mg Caps (Clindamycin Hcl) .... Take 4 Capsules By Mouth 1 Hour Before Dental Procedures 19)  Klor-Con 10 10 Meq Cr-Tabs (Potassium Chloride) .... Take 1 Tablet By Mouth Two Times A Day 20)  Tylenol With Codeine #4 300-60 Mg Tabs (  Acetaminophen-Codeine) .... Take 1 Tablet Every 6 Hours As Needed 21)  Diflucan 150 Mg Tabs (Fluconazole) .... Take 1 Tablet By Mouth As Needed Yeast Infection 22)  Albuterol Breathing Treatment .... Use As Needed 23)  Oxygen 2l .... At Bedtime 24)  Nexium 40 Mg Cpdr (Esomeprazole Magnesium) .... Take 1 Cap Twice Daily 25)  Humalog Kwikpen 100 Unit/ml Soln (Insulin Lispro (Human)) .... Use Asd Ssi With Checking Blood Sugars Four Times Per Day 26)  Humalog Kwikpen Needles .... Use Asd Four Times Per Day  Allergies (verified): 1)  ! Pcn 2)  ! Ace Inhibitors 3)  ! * Tape  Past History:  Family History: Last updated: 09/09/2009 No FH of Colon Cancer: Family History of Esophageal Cancer:Sister?, Brother Family History of Colon Polyps:Brother Family History of Pancreatic Cancer: Sister? Family History of Diabetes: Sister, Burnadette Pop Family History of Heart Disease: MGF  Social History: Last updated: 11/22/2009 Patient is a former smoker. -stopped 1988 Alcohol Use - no Illicit Drug Use - no Patient does not get regular exercise.  widowed 2004 disabled former work - Personnel officer  Risk Factors: Exercise: no (09/09/2009)  Risk Factors: Smoking Status: quit (09/09/2009)  Past Medical History: GERD (ICD-530.81) OBESITY ASTHMA (ICD-493.90) SLEEP APNEA (ICD-780.57) BRONCHITIS (ICD-490) HYPERTENSION (ICD-401.9) BACK PAIN (ICD-724.5) DIAB W/O COMP TYPE  II/UNS NOT STATED UNCNTRL (ICD-250.00) - Dr Everardo All SINUS TARSI SYNDROME (ICD-726.79) - Dr Darrick Penna - podiatry ACHILLES TENDINITIS (ICD-726.71) FOOT PAIN (ICD-729.5) C-spine DJD - severe hx of dysphagia with severe dysmotility by barium swallow 2007 - Dr Lina Sar Anemia-NOS COPD  cardiology - Dr Julieanne Manson pulmonary - Dr Corine Shelter  Past Surgical History: Laser Surgery-both eyes tonsillectomy back surgery-fusion  - mult  cervical spine levels - Dr Lovell Sheehan exision of abcess??? -chest Cholecystectomy D & C Exploratory laparotomy?? Partial Hysterectomy knee replacement-left - 2005 knee replacement-right - 2005 - Dr Turner Daniels foot surgery-right x 2  Family History: Reviewed history from 09/09/2009 and no changes required. No FH of Colon Cancer: Family History of Esophageal Cancer:Sister?, Brother Family History of Colon Polyps:Brother Family History of Pancreatic Cancer: Sister? Family History of Diabetes: Sister, Burnadette Pop Family History of Heart Disease: MGF  Social History: Reviewed history from 09/16/2009 and no changes required. Patient is a former smoker. -stopped 66 Alcohol Use - no Illicit Drug Use - no Patient does not get regular exercise.  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         all otherwise negative per pt -  except for mild constipation with the narcotic  Physical Exam  General:  alert and overweight-appearing.   Head:  normocephalic and atraumatic.   Eyes:  vision grossly intact,  pupils equal, and pupils round.   Ears:  R ear normal and L ear normal.   Nose:  no external deformity and no nasal discharge.   Mouth:  no gingival abnormalities and pharynx pink and moist.   Neck:  supple and no masses.   Lungs:  normal respiratory effort and normal breath sounds.   Heart:  normal rate and regular rhythm.   Abdomen:  soft, non-tender, and normal bowel sounds.   Msk:  mild tender lower lumbar  spine in the midline, and bilat lower ant costal margin rib tender, as well as tender over the right greater trochanter Extremities:  no edema, no erythema  Neurologic:  cranial nerves II-XII intact and strength normal in all extremities.   Skin:  color normal and no rashes.   Psych:  memory intact for recent and remote and normally interactive.     Impression & Recommendations:  Problem # 1:  Preventive Health Care (ICD-V70.0)  Overall doing well, age appropriate education and counseling updated and referral for appropriate preventive services done unless declined, immunizations up to date or declined, diet counseling done if overweight, urged to quit smoking if smokes , most recent labs reviewed and current ordered if appropriate, ecg reviewed or declined (interpretation per ECG scanned in the EMR if done); information regarding Medicare Prevention requirements given if appropriate; speciality referrals updated as appropriate   Orders: TLB-BMP (Basic Metabolic Panel-BMET) (80048-METABOL) TLB-CBC Platelet - w/Differential (85025-CBCD) TLB-Hepatic/Liver Function Pnl (80076-HEPATIC) TLB-TSH (Thyroid Stimulating Hormone) (84443-TSH) TLB-Lipid Panel (80061-LIPID) TLB-Udip ONLY (81003-UDIP) T-Vitamin D (25-Hydroxy) (16109-60454)  Problem # 2:  HIP PAIN, RIGHT (ICD-719.45)  Her updated medication list for this problem includes:    Tramadol Hcl 50 Mg Tabs (Tramadol hcl) .Marland Kitchen... Take 1 tablet by mouth four times daily    Aspirin 81 Mg Tbec (Aspirin) .Marland Kitchen... Take 1 tablet by mouth once a  day    Meperidine Hcl 50 Mg Tabs (Meperidine hcl) .Marland Kitchen... Take 1 tablet by mouth eveyr 6 hours as needed    Tylenol With Codeine #4 300-60 Mg Tabs (Acetaminophen-codeine) .Marland Kitchen... Take 1 tablet every 6 hours as needed  Orders: Orthopedic Surgeon Referral (Ortho Surgeon) T-Hip Comp Right Min 2 views (73510TC) c/w most likely bursitis, to check film  - f/o avn, DJD  Problem # 3:  HYPERTENSION (ICD-401.9)  Her updated medication list for this problem includes:    Metoprolol Tartrate 50 Mg Tabs (Metoprolol tartrate) .Marland Kitchen... Take 1 1/2 tablet daily    Furosemide 40 Mg Tabs (Furosemide) .Marland Kitchen... Take 1 tablet by mouth once a day    Diovan 320 Mg Tabs (Valsartan) .Marland Kitchen... Take 1 tablet by mouth once a day  BP today: 120/72 Prior BP: 110/60 (09/16/2009) stable overall by hx and exam, ok to continue meds/tx as is   Complete Medication List: 1)  Nexium 40 Mg Cpdr (Esomeprazole magnesium) .... Take 1 tablet by mouth two times a day 2)  Nitroglycerin 0.2 Mg/hr Pt24 (Nitroglycerin) .... Apply 1/4 patch to achilles tendon once daily. 3)  Lantus 100 Unit/ml Soln (Insulin glargine) .... Take 100 units every evening. 4)  Advair Diskus 100-50 Mcg/dose Aepb (Fluticasone-salmeterol) .... Take 2 puffs two times a day 5)  Tramadol Hcl 50 Mg Tabs (Tramadol hcl) .... Take 1 tablet by mouth four times daily 6)  Vesicare 10 Mg Tabs (Solifenacin succinate) .... Take 1 tablet by mouth once a day 7)  Multivitamins Tabs (Multiple vitamin) .... Take 1 tablet by mouth once a day 8)  Vitamin D 1000 Unit Tabs (Cholecalciferol) .... Take 2 tablets by mouth once daily 9)  Metoprolol Tartrate 50 Mg Tabs (Metoprolol tartrate) .... Take 1 1/2 tablet daily 10)  Promethazine Hcl 25 Mg Tabs (Promethazine hcl) .... Take 1-2 tablets every 4-6 hours as needed for nausea 11)  Maalox Advanced Max St 400-400-40 Mg/44ml Susp (Alum & mag hydroxide-simeth) .... "turns the bottle up and drinks" three times daily 12)  Furosemide 40 Mg Tabs  (Furosemide) .... Take 1 tablet by mouth once a day  13)  Diovan 320 Mg Tabs (Valsartan) .... Take 1 tablet by mouth once a day 14)  Aspirin 81 Mg Tbec (Aspirin) .... Take 1 tablet by mouth once a day 15)  Meperidine Hcl 50 Mg Tabs (Meperidine hcl) .... Take 1 tablet by mouth eveyr 6 hours as needed 16)  Promethazine-codeine 6.25-10 Mg/70ml Syrp (Promethazine-codeine) .... Take 1 teaspoonful every four hours as needed for cough 17)  Neurontin 600 Mg Tabs (Gabapentin) .... Take 1 tablet by mouth three times a day 18)  Clindamycin Hcl 150 Mg Caps (Clindamycin hcl) .... Take 4 capsules by mouth 1 hour before dental procedures 19)  Klor-con 10 10 Meq Cr-tabs (Potassium chloride) .... Take 1 tablet by mouth two times a day 20)  Tylenol With Codeine #4 300-60 Mg Tabs (Acetaminophen-codeine) .... Take 1 tablet every 6 hours as needed 21)  Diflucan 150 Mg Tabs (Fluconazole) .... Take 1 tablet by mouth as needed yeast infection 22)  Albuterol Breathing Treatment  .... Use as needed 23)  Oxygen 2l  .... At bedtime 24)  Nexium 40 Mg Cpdr (Esomeprazole magnesium) .... Take 1 cap twice daily 25)  Humalog Kwikpen 100 Unit/ml Soln (Insulin lispro (human)) .... Use asd ssi with checking blood sugars four times per day 26)  Humalog Kwikpen Needles  .... Use asd four times per day  Other Orders: TLB-B12 + Folate Pnl (16109_60454-U98/JXB) TLB-IBC Pnl (Iron/FE;Transferrin) (83550-IBC) T-Bone Densitometry [phone]) Pneumococcal Vaccine (95621) Admin 1st Vaccine (30865)  Patient Instructions: 1)  you had the pnuemonia shot today 2)  please return on Mon June 6 for the tetanus shot 3)  please call your GYN for your yearly pap smear and mammogram 4)  please schedule the bone density test before you leave 5)  Please go to the Lab in the basement for your blood and/or urine tests today , including the Vit D test 6)  Please go to Radiology in the basement level for your X-Ray today for the hip 7)  you can take Colace  100 mg twice per day as needed for a stool softner (OTC med) 8)  Continue all previous medications as before this visit , including the pain medication refill today 9)  You will be contacted about the referral(s) to: Orthopedic 10)  Please schedule a follow-up appointment in 6 months or sooner if needed Prescriptions: TYLENOL WITH CODEINE #4 300-60 MG TABS (ACETAMINOPHEN-CODEINE) Take 1 tablet every 6 hours as needed  #60 x 2   Entered and Authorized by:   Corwin Levins MD   Signed by:   Corwin Levins MD on 11/22/2009   Method used:   Print then Give to Patient   RxID:   [id number] NEURONTIN 600 MG TABS (GABAPENTIN) Take 1 tablet by mouth three times a day  #90 x 5   Entered and Authorized by:   Corwin Levins MD   Signed by:   Corwin Levins MD on 11/22/2009   Method used:   Electronically to        [facility] [address] (retail)       [address] [place], [place]  [address]       Ph: [phone]       Fax: [phone]   RxID:   [id number] FUROSEMIDE 40 MG TABS (FUROSEMIDE) Take 1 tablet by mouth once a day  #90 x 3   Entered and Authorized by:  Corwin Levins MD   Signed by:   Corwin Levins MD on 11/22/2009   Method used:   Electronically to        [facility] [address] (retail)       [address]       [place], [place]  [address]       Ph: [phone]       Fax: [phone]   RxID:   [id number] DIOVAN 320 MG TABS (VALSARTAN) Take 1 tablet by mouth once a day  #90 x 3   Entered and Authorized by:   Corwin Levins MD   Signed by:   Corwin Levins MD on 11/22/2009   Method used:   Electronically to        [facility] [address] (retail)       [address]       [place], [place]  [address]       Ph: [phone]       Fax: [phone]   RxID:   [id number] VESICARE 10 MG TABS (SOLIFENACIN SUCCINATE) Take 1 tablet by mouth once a day  #90 x 3   Entered and Authorized by:   Corwin Levins MD   Signed by:   Corwin Levins MD on 11/22/2009   Method used:   Electronically to        [facility] [address] (retail)       [address]       [place], [place]  [address]       Ph: [phone]       Fax: [phone]   RxID:   [id number] ADVAIR DISKUS 100-50 MCG/DOSE AEPB (FLUTICASONE-SALMETEROL) Take 2 puffs two times a day  #3 x 3   Entered and Authorized by:   Corwin Levins MD   Signed by:   Corwin Levins MD on 11/22/2009   Method used:   Electronically to        [facility] [address] (retail)       [address]       [place], [place]  [address]       Ph: [phone]       Fax: [phone]   RxID:   [id number] NEXIUM 40 MG CPDR (ESOMEPRAZOLE MAGNESIUM) Take 1 tablet by mouth two times a day  #180 x 3   Entered and Authorized by:   Corwin Levins MD   Signed by:   Corwin Levins MD on 11/22/2009   Method used:   Electronically to        [facility] [address] (retail)       [address] [place], [place]  [address]       Ph: [phone]       Fax: [phone]   RxID:   [id number]    Immunizations Administered:  Pneumonia Vaccine:    Vaccine Type: Pneumovax    Site: left deltoid    Mfr: Merck    Dose: 0.5 ml    Route: IM    Given by: Zella Ball Ewing    Exp. Date: 04/16/2011    Lot #: [id number]  VIS given: 01/18/96 version given November 22, 2009.

## 2010-07-22 NOTE — Assessment & Plan Note (Signed)
Summary: TETANUS INJ / Christy May   Nurse Visit   Allergies: 1)  ! Pcn 2)  ! Ace Inhibitors 3)  ! * Tape  Immunizations Administered:  Tetanus Vaccine:    Vaccine Type: Tdap    Site: left deltoid    Mfr: GlaxoSmithKline    Dose: 0.5 ml    Route: IM    Given by: Margaret Pyle, CMA    Exp. Date: 04/17/2010    Lot #: [id number]    VIS given: 05/10/07 version given November 25, 2009.  Orders Added: 1)  Tdap => 63yrs IM [90715] 2)  Admin 1st Vaccine [47829]

## 2010-07-22 NOTE — Medication Information (Signed)
Summary: Albuterol / [facility]  Albuterol / [facility]   Imported By: Lennie Odor 03/27/2010 15:42:48  _____________________________________________________________________  External Attachment:    Type:   Image     Comment:   External Document

## 2010-07-22 NOTE — Assessment & Plan Note (Signed)
Summary: 3 MO ROV /NWS #   Vital Signs:  Patient profile:   60 year old female Height:      64 inches Weight:      292.25 pounds BMI:     50.35 O2 Sat:      94 % on Room air Temp:     98.1 degrees F oral Pulse rate:   83 / minute BP sitting:   122 / 70  (left arm) Cuff size:   large  Vitals Entered By: Zella Ball Ewing CMA Duncan Dull) (May 27, 2010 4:50 PM)  O2 Flow:  Room air CC: 3 month ROV/RE   Primary Care Provider:  Corine Shelter, MD  CC:  3 month ROV/RE.  History of Present Illness: here to f/u ; overall feels quite very poorly for at least 1 wk; but unclear why;  has marked general fatigue, and urinary frequency worse in the past wk, but not fever, dysuria, urgency, hematuria,  back pain, n/v, abd pain or chills.  Pt denies CP except for bilat lower costal rib margin pain as per prior visit, no worsening sob, doe, wheezing, orthopnea, pnd, worsening LE edema, palps, dizziness or syncope  Pt denies new neuro symptoms such as headache, facial or extremity weakness  Pt denies polydipsia, polyuria, or low sugar symptoms such as shakiness improved with eating.  Overall good compliance with meds, trying to follow low chol DM  diet, wt stable, little excercise however No fever, wt loss, night sweats, loss of appetite or other constitutional symptoms  Denies worsening depressive symptoms, suicidal ideation, or panic.    Problems Prior to Update: 1)  Urinary Frequency  (ICD-788.41) 2)  Wheezing  (ICD-786.07) 3)  Chest Pain  (ICD-786.50) 4)  Dehydration  (ICD-276.51) 5)  Vitamin D Deficiency  (ICD-268.9) 6)  Preventive Health Care  (ICD-V70.0) 7)  Hip Pain, Right  (ICD-719.45) 8)  Menopausal Disorder  (ICD-627.9) 9)  COPD  (ICD-496) 10)  Anemia-nos  (ICD-285.9) 11)  Degenerative Joint Disease, Cervical Spine  (ICD-721.90) 12)  Depression  (ICD-311) 13)  Polyneuropathy  (ICD-357.9) 14)  Obesity  (ICD-278.00) 15)  Chest Pain  (ICD-786.50) 16)  Gerd  (ICD-530.81) 17)  Nausea   (ICD-787.02) 18)  Asthma  (ICD-493.90) 19)  Sleep Apnea  (ICD-780.57) 20)  Bronchitis  (ICD-490) 21)  Hypertension  (ICD-401.9) 22)  Back Pain  (ICD-724.5) 23)  Diab w/o Comp Type Ii/uns Not Stated Uncntrl  (ICD-250.00) 24)  Sinus Tarsi Syndrome  (ICD-726.79) 25)  Achilles Tendinitis  (ICD-726.71) 26)  Foot Pain  (ICD-729.5)  Medications Prior to Update: 1)  Nexium 40 Mg Cpdr (Esomeprazole Magnesium) .... Take 1 Tablet By Mouth Two Times A Day 2)  Nitroglycerin 0.2 Mg/hr Pt24 (Nitroglycerin) .... Apply 1/4 Patch To Achilles Tendon Once Daily. 3)  Lantus 100 Unit/ml Soln (Insulin Glargine) .... Take 100 Units Every Evening. 4)  Advair Diskus 100-50 Mcg/dose Aepb (Fluticasone-Salmeterol) .... Take 2 Puffs Two Times A Day 5)  Tramadol Hcl 50 Mg Tabs (Tramadol Hcl) .... Take 1 Tablet By Mouth Four Times Daily 6)  Vesicare 10 Mg Tabs (Solifenacin Succinate) .... Take 1 Tablet By Mouth Once A Day 7)  Multivitamins  Tabs (Multiple Vitamin) .... Take 1 Tablet By Mouth Once A Day 8)  Vitamin D 1000 Unit Tabs (Cholecalciferol) .... Take 2 Tablets By Mouth Once Daily 9)  Metoprolol Tartrate 50 Mg Tabs (Metoprolol Tartrate) .... Take 1 1/2 Tablet Two Times A Day 10)  Promethazine Hcl 25 Mg Tabs (Promethazine Hcl) .Marland KitchenMarland KitchenMarland Kitchen  Take 1-2 Tablets Every 4-6 Hours As Needed For Nausea 11)  Maalox Advanced Max St 400-400-40 Mg/14ml Susp (Alum & Mag Hydroxide-Simeth) .... "turns The Bottle Up and Drinks" Three Times Daily 12)  Furosemide 40 Mg Tabs (Furosemide) .... Take 1 Tablet By Mouth Once A Day 13)  Diovan 320 Mg Tabs (Valsartan) .... Take 1 Tablet By Mouth Once A Day 14)  Aspirin 81 Mg Tbec (Aspirin) .... Take 1 Tablet By Mouth Once A Day 15)  Meperidine Hcl 50 Mg Tabs (Meperidine Hcl) .... Take 1 Tablet By Mouth Eveyr 6 Hours As Needed 16)  Promethazine-Codeine 6.25-10 Mg/69ml Syrp (Promethazine-Codeine) .... Take 1 Teaspoonful Every Four Hours As Needed For Cough 17)  Neurontin 600 Mg Tabs (Gabapentin) ....  Take 1 Tablet By Mouth Three Times A Day 18)  Clindamycin Hcl 150 Mg Caps (Clindamycin Hcl) .... Take 4 Capsules By Mouth 1 Hour Before Dental Procedures 19)  Klor-Con 10 10 Meq Cr-Tabs (Potassium Chloride) .... Take 1 Tablet By Mouth Two Times A Day 20)  Tylenol With Codeine #4 300-60 Mg Tabs (Acetaminophen-Codeine) .... Take 1 Tablet Every 6 Hours As Needed - To Fill May 01, 2010 21)  Albuterol Breathing Treatment .... Use As Needed 22)  Oxygen 2l .... At Bedtime 23)  Nexium 40 Mg Cpdr (Esomeprazole Magnesium) .... Take 1 Cap Twice Daily 24)  Humalog Kwikpen 100 Unit/ml Soln (Insulin Lispro (Human)) .... Use Asd Ssi With Checking Blood Sugars Four Times Per Day 25)  Humalog Kwikpen Needles .... Use Asd Four Times Per Day 26)  Advair Diskus 100-50 Mcg/dose Aepb (Fluticasone-Salmeterol) .Marland Kitchen.. 1 Puff Two Times A Day 27)  Omeprazole 20 Mg Cpdr (Omeprazole) .Marland Kitchen.. 1 By Mouth Bid  Current Medications (verified): 1)  Nexium 40 Mg Cpdr (Esomeprazole Magnesium) .... Take 1 Tablet By Mouth Two Times A Day 2)  Nitroglycerin 0.2 Mg/hr Pt24 (Nitroglycerin) .... Apply 1/4 Patch To Achilles Tendon Once Daily. 3)  Lantus 100 Unit/ml Soln (Insulin Glargine) .... Take 100 Units Every Evening. 4)  Advair Diskus 100-50 Mcg/dose Aepb (Fluticasone-Salmeterol) .... Take 2 Puffs Two Times A Day 5)  Tramadol Hcl 50 Mg Tabs (Tramadol Hcl) .... Take 1 Tablet By Mouth Four Times Daily 6)  Vesicare 10 Mg Tabs (Solifenacin Succinate) .... Take 1 Tablet By Mouth Once A Day 7)  Multivitamins  Tabs (Multiple Vitamin) .... Take 1 Tablet By Mouth Once A Day 8)  Vitamin D 1000 Unit Tabs (Cholecalciferol) .... Take 2 Tablets By Mouth Once Daily 9)  Metoprolol Tartrate 50 Mg Tabs (Metoprolol Tartrate) .... Take 1 1/2 Tablet Two Times A Day 10)  Promethazine Hcl 25 Mg Tabs (Promethazine Hcl) .... Take 1-2 Tablets Every 4-6 Hours As Needed For Nausea 11)  Maalox Advanced Max St 400-400-40 Mg/70ml Susp (Alum & Mag Hydroxide-Simeth)  .... "turns The Bottle Up and Drinks" Three Times Daily 12)  Furosemide 40 Mg Tabs (Furosemide) .... Take 1 Tablet By Mouth Once A Day 13)  Diovan 320 Mg Tabs (Valsartan) .... Take 1 Tablet By Mouth Once A Day 14)  Aspirin 81 Mg Tbec (Aspirin) .... Take 1 Tablet By Mouth Once A Day 15)  Meperidine Hcl 50 Mg Tabs (Meperidine Hcl) .... Take 1 Tablet By Mouth Eveyr 6 Hours As Needed 16)  Promethazine-Codeine 6.25-10 Mg/39ml Syrp (Promethazine-Codeine) .... Take 1 Teaspoonful Every Four Hours As Needed For Cough 17)  Neurontin 600 Mg Tabs (Gabapentin) .... Take 1 Tablet By Mouth Three Times A Day 18)  Clindamycin Hcl 150 Mg Caps (Clindamycin  Hcl) .... Take 4 Capsules By Mouth 1 Hour Before Dental Procedures 19)  Klor-Con 10 10 Meq Cr-Tabs (Potassium Chloride) .... Take 1 Tablet By Mouth Two Times A Day 20)  Tylenol With Codeine #4 300-60 Mg Tabs (Acetaminophen-Codeine) .... Take 1 Tablet Every 6 Hours As Needed - To Fill May 01, 2010 21)  Albuterol Breathing Treatment .... Use As Needed 22)  Oxygen 2l .... At Bedtime 23)  Nexium 40 Mg Cpdr (Esomeprazole Magnesium) .... Take 1 Cap Twice Daily 24)  Humalog Kwikpen 100 Unit/ml Soln (Insulin Lispro (Human)) .... Use Asd Ssi With Checking Blood Sugars Four Times Per Day 25)  Humalog Kwikpen Needles .... Use Asd Four Times Per Day 26)  Advair Diskus 100-50 Mcg/dose Aepb (Fluticasone-Salmeterol) .Marland Kitchen.. 1 Puff Two Times A Day 27)  Omeprazole 20 Mg Cpdr (Omeprazole) .Marland Kitchen.. 1 By Mouth Bid 28)  Levofloxacin 500 Mg Tabs (Levofloxacin) .Marland Kitchen.. 1po Once Daily 29)  Tramadol Hcl 50 Mg Tabs (Tramadol Hcl) .Marland Kitchen.. 1po Q 6 Hrs As Needed Pain  Allergies (verified): 1)  ! Pcn 2)  ! Ace Inhibitors 3)  ! * Tape  Past History:  Past Medical History: Last updated: 11/22/2009 GERD (ICD-530.81) OBESITY ASTHMA (ICD-493.90) SLEEP APNEA (ICD-780.57) BRONCHITIS (ICD-490) HYPERTENSION (ICD-401.9) BACK PAIN (ICD-724.5) DIAB W/O COMP TYPE II/UNS NOT STATED UNCNTRL (ICD-250.00) -  Dr Everardo All SINUS TARSI SYNDROME (ICD-726.79) - Dr Darrick Penna - podiatry ACHILLES TENDINITIS (ICD-726.71) FOOT PAIN (ICD-729.5) C-spine DJD - severe hx of dysphagia with severe dysmotility by barium swallow 2007 - Dr Lina Sar Anemia-NOS COPD  cardiology - Dr Julieanne Manson pulmonary - Dr Corine Shelter  Past Surgical History: Last updated: 11/22/2009 Laser Surgery-both eyes tonsillectomy back surgery-fusion  - mult  cervical spine levels - Dr Lovell Sheehan exision of abcess??? -chest Cholecystectomy D & C Exploratory laparotomy?? Partial Hysterectomy knee replacement-left - 2005 knee replacement-right - 2005 - Dr Turner Daniels foot surgery-right x 2  Social History: Last updated: 11/22/2009 Patient is a former smoker. -stopped 1988 Alcohol Use - no Illicit Drug Use - no Patient does not get regular exercise.  widowed 2004 disabled former work - Personnel officer  Risk Factors: Exercise: no (09/09/2009)  Risk Factors: Smoking Status: quit (09/09/2009)  Review of Systems       all otherwise negative per pt -    Physical Exam  General:  alert and overweight-appearing., fatigued, mild ill appearing Head:  normocephalic and atraumatic.   Eyes:  vision grossly intact, pupils equal, and pupils round.   Ears:  R ear normal and L ear normal.   Nose:  no external deformity and no nasal discharge.   Mouth:  no gingival abnormalities and pharynx pink and moist.   Neck:  supple and no masses.   Lungs:  normal respiratory effort and normal breath sounds.   Heart:  normal rate and regular rhythm.   Abdomen:  soft, non-tender, and normal bowel sounds.   Msk:  has mod tender to the costal margins ant rib cage bialt without erythema, sweling or rash Extremities:  no edema, no erythema  Neurologic:  not confused, gross motor normal   Impression & Recommendations:  Problem # 1:  CHEST PAIN (ICD-786.50) I think similar to last visit most liekly msk rib/chest wall pain; ecg reviewed, for  CXR as well  Orders: EKG w/ Interpretation (93000) T-2 View CXR, Same Day (71020.5TC)  Problem # 2:  URINARY FREQUENCY (ICD-788.41)  Her updated medication list for this problem includes:    Vesicare 10 Mg Tabs (Solifenacin succinate) .Marland KitchenMarland KitchenMarland KitchenMarland Kitchen  Take 1 tablet by mouth once a day  Orders: T-Culture, Urine ([address]-60454) TLB-CBC Platelet - w/Differential (85025-CBCD) TLB-Udip w/ Micro (81001-URINE) diff includes OAB uncontrolled, UTI or DM out of control; to check labs, for empiric antibx and urine cx  Problem # 3:  HYPERTENSION (ICD-401.9)  Her updated medication list for this problem includes:    Metoprolol Tartrate 50 Mg Tabs (Metoprolol tartrate) .Marland Kitchen... Take 1 1/2 tablet two times a day    Furosemide 40 Mg Tabs (Furosemide) .Marland Kitchen... Take 1 tablet by mouth once a day    Diovan 320 Mg Tabs (Valsartan) .Marland Kitchen... Take 1 tablet by mouth once a day  BP today: 122/70 Prior BP: 114/70 (02/12/2010)  Labs Reviewed: K+: 4.7 (11/22/2009) Creat: : 1.0 (11/22/2009)   Chol: 183 (11/22/2009)   HDL: 50.10 (11/22/2009)   LDL: 113 (11/22/2009)   TG: 100.0 (11/22/2009) stable overall by hx and exam, ok to continue meds/tx as is   Problem # 4:  DIAB W/O COMP TYPE II/UNS NOT STATED UNCNTRL (ICD-250.00)  Her updated medication list for this problem includes:    Lantus 100 Unit/ml Soln (Insulin glargine) .Marland Kitchen... Take 100 units every evening.    Diovan 320 Mg Tabs (Valsartan) .Marland Kitchen... Take 1 tablet by mouth once a day    Aspirin 81 Mg Tbec (Aspirin) .Marland Kitchen... Take 1 tablet by mouth once a day    Humalog Kwikpen 100 Unit/ml Soln (Insulin lispro (human)) ..... Use asd ssi with checking blood sugars four times per day  Orders: TLB-BMP (Basic Metabolic Panel-BMET) (80048-METABOL) TLB-A1C / Hgb A1C (Glycohemoglobin) (83036-A1C) TLB-Lipid Panel (80061-LIPID) ? control with urinary freq o/w seems ok per pt hx;  for labs - Pt to cont DM diet, excercise, wt control efforts; to check labs today   Complete Medication List: 1)   Nexium 40 Mg Cpdr (Esomeprazole magnesium) .... Take 1 tablet by mouth two times a day 2)  Nitroglycerin 0.2 Mg/hr Pt24 (Nitroglycerin) .... Apply 1/4 patch to achilles tendon once daily. 3)  Lantus 100 Unit/ml Soln (Insulin glargine) .... Take 100 units every evening. 4)  Advair Diskus 100-50 Mcg/dose Aepb (Fluticasone-salmeterol) .... Take 2 puffs two times a day 5)  Tramadol Hcl 50 Mg Tabs (Tramadol hcl) .... Take 1 tablet by mouth four times daily 6)  Vesicare 10 Mg Tabs (Solifenacin succinate) .... Take 1 tablet by mouth once a day 7)  Multivitamins Tabs (Multiple vitamin) .... Take 1 tablet by mouth once a day 8)  Vitamin D 1000 Unit Tabs (Cholecalciferol) .... Take 2 tablets by mouth once daily 9)  Metoprolol Tartrate 50 Mg Tabs (Metoprolol tartrate) .... Take 1 1/2 tablet two times a day 10)  Promethazine Hcl 25 Mg Tabs (Promethazine hcl) .... Take 1-2 tablets every 4-6 hours as needed for nausea 11)  Maalox Advanced Max St 400-400-40 Mg/44ml Susp (Alum & mag hydroxide-simeth) .... "turns the bottle up and drinks" three times daily 12)  Furosemide 40 Mg Tabs (Furosemide) .... Take 1 tablet by mouth once a day 13)  Diovan 320 Mg Tabs (Valsartan) .... Take 1 tablet by mouth once a day 14)  Aspirin 81 Mg Tbec (Aspirin) .... Take 1 tablet by mouth once a day 15)  Meperidine Hcl 50 Mg Tabs (Meperidine hcl) .... Take 1 tablet by mouth eveyr 6 hours as needed 16)  Promethazine-codeine 6.25-10 Mg/2ml Syrp (Promethazine-codeine) .... Take 1 teaspoonful every four hours as needed for cough 17)  Neurontin 600 Mg Tabs (Gabapentin) .... Take 1 tablet by mouth three times a day  18)  Clindamycin Hcl 150 Mg Caps (Clindamycin hcl) .... Take 4 capsules by mouth 1 hour before dental procedures 19)  Klor-con 10 10 Meq Cr-tabs (Potassium chloride) .... Take 1 tablet by mouth two times a day 20)  Tylenol With Codeine #4 300-60 Mg Tabs (Acetaminophen-codeine) .... Take 1 tablet every 6 hours as needed - to fill  May 01, 2010 21)  Albuterol Breathing Treatment  .... Use as needed 22)  Oxygen 2l  .... At bedtime 23)  Nexium 40 Mg Cpdr (Esomeprazole magnesium) .... Take 1 cap twice daily 24)  Humalog Kwikpen 100 Unit/ml Soln (Insulin lispro (human)) .... Use asd ssi with checking blood sugars four times per day 25)  Humalog Kwikpen Needles  .... Use asd four times per day 26)  Advair Diskus 100-50 Mcg/dose Aepb (Fluticasone-salmeterol) .Marland Kitchen.. 1 puff two times a day 27)  Omeprazole 20 Mg Cpdr (Omeprazole) .Marland Kitchen.. 1 by mouth bid 28)  Levofloxacin 500 Mg Tabs (Levofloxacin) .Marland Kitchen.. 1po once daily 29)  Tramadol Hcl 50 Mg Tabs (Tramadol hcl) .Marland Kitchen.. 1po q 6 hrs as needed pain  Patient Instructions: 1)  Please take all new medications as prescribed - the antibiotic, and the pain medication 2)  Continue all previous medications as before this visit  3)  Please go to the Lab in the basement for your blood and/or urine tests today 4)  Please go to Radiology in the basement level for your X-Ray today  5)  Please call the number on the [facility] Card for results of your testing  6)  Please schedule a follow-up appointment in June 2012 with CPX and labs and: 7)  HbgA1C prior to visit, ICD-9: 250.02 8)  Urine Microalbumin prior to visit, ICD-9: Prescriptions: TRAMADOL HCL 50 MG TABS (TRAMADOL HCL) 1po q 6 hrs as needed pain  #60 x 1   Entered and Authorized by:   Corwin Levins MD   Signed by:   Corwin Levins MD on 05/27/2010   Method used:   Electronically to        [facility] [id number] (retail)       [address]       [place], [place]  [address]       Ph: [phone]       Fax: [phone]   RxID:   [id number] LEVOFLOXACIN 500 MG TABS (LEVOFLOXACIN) 1po once daily  #10 x 0   Entered and Authorized by:   Corwin Levins MD   Signed by:   Corwin Levins MD on 05/27/2010   Method used:   Electronically to        [facility] [id number] (retail)       [address] [place], [place]  [address]       Ph: [phone]       Fax: [phone]   RxID:   [id number]    Orders Added: 1)  EKG w/ Interpretation [93000] 2)  T-Culture, Urine [88416-60630] 3)  TLB-CBC Platelet - w/Differential [85025-CBCD] 4)  T-2 View CXR, Same Day [71020.5TC] 5)  TLB-Udip w/ Micro [81001-URINE] 6)  TLB-BMP (Basic Metabolic Panel-BMET) [80048-METABOL] 7)  TLB-A1C / Hgb A1C (Glycohemoglobin) [83036-A1C] 8)  TLB-Lipid Panel [80061-LIPID] 9)  Est. Patient Level IV [[address]]

## 2010-07-22 NOTE — Miscellaneous (Signed)
Summary: BONE DENSITY  Clinical Lists Changes  Orders: Added new Test order of T-Bone Densitometry (77080) - Signed Added new Test order of T-Lumbar Vertebral Assessment (77082) - Signed 

## 2010-07-22 NOTE — Letter (Signed)
Summary: [facility]  [facility]   Imported By: Sherian Rein 03/10/2010 12:26:05  _____________________________________________________________________  External Attachment:    Type:   Image     Comment:   External Document

## 2010-07-22 NOTE — Progress Notes (Signed)
Summary: rx refill  Phone Note Refill Request Call back at Home Phone [phone] Message from:  Patient  Refills Requested: Medication #1:  METOPROLOL TARTRATE 50 MG TABS Take 1 1/2 tablet daily   Dosage confirmed as above?Dosage Confirmed   Notes: [facility] 3 month supply Initial call taken by: Brenton Grills MA,  February 07, 2010 4:36 PM    Prescriptions: METOPROLOL TARTRATE 50 MG TABS (METOPROLOL TARTRATE) Take 1 1/2 tablet daily  #3 month x 2   Entered by:   Brenton Grills MA   Authorized by:   Corwin Levins MD   Signed by:   Brenton Grills MA on 02/07/2010   Method used:   Electronically to        [facility] [id number] (retail)       [address] [place], [place]  [address]       Ph: [phone]       Fax: [phone]   RxID:   [id number]

## 2010-07-22 NOTE — Progress Notes (Signed)
Summary: TRIAGE-GERD,CHEST PAIN   Phone Note Call from Patient Call back at Home Phone [phone]   Caller: Patient Call For: Dr. Juanda Chance Reason for Call: Talk to Nurse Summary of Call: pt reporting chest pain, mild reflux Initial call taken by: [name],  September 09, 2009 10:18 AM  Follow-up for Phone Call        Pt. c/o chest pain that began 2-3 days ago, was more severe yesterday. Denies SOB, sweating, pain in neck or jaw, pain in arms, n/v. Takes Nexium two times a day. States she does have heartburn, doesn't know how often. Pt. wants to be seen today, she will see Mike G[facility] today at 3pm. Follow-up by: Laureen Ochs LPN,  September 09, 2009 10:58 AM

## 2010-07-22 NOTE — Progress Notes (Signed)
  Phone Note Refill Request  on December 13, 2009 5:00 PM  Refills Requested: Medication #1:  PROMETHAZINE HCL 25 MG TABS Take 1-2 tablets every 4-6 hours as needed for nausea   Dosage confirmed as above?Dosage Confirmed   Notes: Tree surgeon Initial call taken by: Zella Ball Ewing CMA Duncan Dull),  December 13, 2009 5:00 PM    Prescriptions: PROMETHAZINE HCL 25 MG TABS (PROMETHAZINE HCL) Take 1-2 tablets every 4-6 hours as needed for nausea  #30 x 6   Entered by:   Scharlene Gloss CMA (AAMA)   Authorized by:   Corwin Levins MD   Signed by:   Scharlene Gloss CMA (AAMA) on 12/13/2009   Method used:   Faxed to ...       [facility] [id number] (retail)       [address] [place], [place]  [address]       Ph: [phone]       Fax: [phone]   RxID:   [id number] PROMETHAZINE HCL 25 MG TABS (PROMETHAZINE HCL) Take 1-2 tablets every 4-6 hours as needed for nausea  #30 x 6   Entered by:   Scharlene Gloss CMA (AAMA)   Authorized by:   Corwin Levins MD   Signed by:   Scharlene Gloss CMA (AAMA) on 12/13/2009   Method used:   Historical   RxID:   [id number]

## 2010-07-22 NOTE — Miscellaneous (Signed)
Summary: Orders Update   Clinical Lists Changes  Orders: Added new Referral order of Endocrinology Referral (Endocrine) - Signed 

## 2010-07-22 NOTE — Progress Notes (Signed)
Summary: Med. Clarification  Phone Note From Pharmacy   Caller: [facility] [address] [phone]* Summary of Call: The Advair usually 1 puff twice daily, to clarify please? Initial call taken by: Scharlene Gloss,  December 06, 2009 11:29 AM  Follow-up for Phone Call        yes - ok to change to this - to robin to handle Follow-up by: Corwin Levins MD,  December 06, 2009 1:07 PM    New/Updated Medications: ADVAIR DISKUS 100-50 MCG/DOSE AEPB (FLUTICASONE-SALMETEROL) 1 puff two times a day Prescriptions: ADVAIR DISKUS 100-50 MCG/DOSE AEPB (FLUTICASONE-SALMETEROL) 1 puff two times a day  #3 x 3   Entered by:   Scharlene Gloss   Authorized by:   Corwin Levins MD   Signed by:   Scharlene Gloss on 12/06/2009   Method used:   Faxed to ...       [facility] [id number] (retail)       [address] [place], [place]  [address]       Ph: [phone]       Fax: [phone]   RxID:   [id number]

## 2010-07-22 NOTE — Assessment & Plan Note (Signed)
Summary: F/U,MC   Vital Signs:  Patient profile:   60 year old female BP sitting:   149 / 78  Vitals Entered By: Lillia Pauls CMA (August 01, 2009 3:07 PM)  History of Present Illness: 60 y/o AAFwith PMH of Diabetes and HTN, presents for F/U of Right achilles tendonitis.  Patient states that pain has improved, continues to get 'waves' of pain that can come after prolonged standing/walking. Pt notes swelling in ankles right> left.   Elevation will decrease the pain.  Patient states that she ices the achilles at night before bedtime.  Continues to do exercises but admits that could be doing them more frequently.  Continues to use nitro patches as prescribed and that pain medication helps with pain.   Allergies: 1)  ! Pcn 2)  ! Ace Inhibitors  Physical Exam  General:  alert and overweight-appearing.   Msk:  Bilateral Ankles: Moderate edema bilateral ankles R>L, non-pitting.   Right ankle: Swelling diffusely throughout ankle and up into leg. loclaizes swelling in the sinus tarsi TTP over distal 2 cm of achilles tendon and with heel compression. FROM 4/5 strength of flexion, extension, abduction, adduction.   Gait:    Impression & Recommendations:  Problem # 1:  ACHILLES TENDINITIS (ICD-726.71)  Orders: Airsport brace (F6213) Continue achilles stretching exercises from book height Nitro patch on achilles Vicodin as needed for pain  # 60 given no RF Ice affected area once or twice a day.  Recommended diabetes specialist to help manage diabetic issues.    rtc 1 mo  Problem # 2:  SINUS TARSI SYNDROME (ICD-726.79) proba cause of generalized ankle pain and swelling and pain into foot will use ankle support on RT to see if this helps  Problem # 3:  FOOT PAIN (ICD-729.5)  Orders: Airsport brace (Y8657)  see #2  Problem # 4:  DIAB W/O COMP TYPE II/UNS NOT STATED UNCNTRL (ICD-250.00)  Her updated medication list for this problem includes:    Lantus 100 Unit/ml Soln  (Insulin glargine) .Marland KitchenMarland KitchenMarland KitchenMarland Kitchen 60 units qhs    Glucophage 1000 Mg Tabs (Metformin hcl) .Marland Kitchen... 1 by mouth bid    Symlin 600 Mcg/ml Soln (Pramlintide acetate) .Marland Kitchen... 120 micrograms three times a day  recently lost her endocrinologist suggested she call dr Margaretmary Bayley  Complete Medication List: 1)  Metoclopramide Hcl 10 Mg Tabs (Metoclopramide hcl) .Marland Kitchen.. 10 mg q hs 2)  Nexium 40 Mg Cpdr (Esomeprazole magnesium) .... Take 1 tablet by mouth two times a day 3)  Nitroglycerin 0.2 Mg/hr Pt24 (Nitroglycerin) .... Apply 1/4 patch to achilles tendon once daily. 4)  Vicodin 5-500 Mg Tabs (Hydrocodone-acetaminophen) .... Take one tab qid as needed pain 5)  Lantus 100 Unit/ml Soln (Insulin glargine) .... 60 units qhs 6)  Glucophage 1000 Mg Tabs (Metformin hcl) .Marland Kitchen.. 1 by mouth bid 7)  Symlin 600 Mcg/ml Soln (Pramlintide acetate) .Marland Kitchen.. 120 micrograms tid Prescriptions: VICODIN 5-500 MG TABS (HYDROCODONE-ACETAMINOPHEN) take one tab qid as needed pain  #60 x 0   Entered by:   Lillia Pauls CMA   Authorized by:   Enid Baas MD   Signed by:   Lillia Pauls CMA on 08/01/2009   Method used:   Print then Give to Patient   RxID:   [id number] VICODIN 5-500 MG TABS (HYDROCODONE-ACETAMINOPHEN) take one tab qid as needed pain  #60 x 0   Entered by:   Lillia Pauls CMA   Authorized by:   Enid Baas MD   Signed by:   Arelia Longest  Moore CMA on 08/01/2009   Method used:   Print then Give to Patient   RxID:   [id number]   Appended Document: F/U,[facility] [place] results we did repeat US scan and she is showing improvent AP width now 0.88 vs 1.09 last month still lots of neovessels lots of degenerative tears and cystic degeneration noted calcificatin at base  images saved

## 2010-07-22 NOTE — Progress Notes (Signed)
Summary: elevated CBG  Phone Note Call from Patient Call back at [facility] Phone [phone] Call back at Work Phone [phone]   Caller: Patient [phone] Summary of Call: pt called stating that her CBG have been elevated since last OV. Pt states that she was not able to sch ROV due to death in her family. Pt states that her CBGs have been as high as 600+ but is 244 now. I advised pt F/U OV with SAE but ot declined at this time stating that she will be leaving to go out of town tomorrow morning. Pt is requesting advisement until she can schedule OV at the end of May. Initial call taken by: Margaret Pyle, CMA,  Oct 30, 2009 2:56 PM  Follow-up for Phone Call        ok for humalog kwikpen and SSI with checking CBG's qid -  < 150   - none 151-200    -   2 units 201-250   -    4  units 251-300   -    6 units 301-350  -    8 units 351 - 450  -  10 units > 140  -     12 units Needs OV f/u with Dr Everardo All as soon as she is able  Follow-up by: Corwin Levins MD,  Oct 30, 2009 3:04 PM  Additional Follow-up for Phone Call Additional follow up Details #1::        pt advised of above and to schedue appt with SAE as soon as possible. RX faxed to [facility] per pt request. Additional Follow-up by: Margaret Pyle, CMA,  Oct 30, 2009 3:51 PM    New/Updated Medications: HUMALOG KWIKPEN 100 UNIT/ML SOLN (INSULIN LISPRO (HUMAN)) use asd SSI with checking blood sugars four times per day * HUMALOG KWIKPEN NEEDLES use asd four times per day Prescriptions: HUMALOG KWIKPEN NEEDLES use asd four times per day  #400 x 5   Entered and Authorized by:   Corwin Levins MD   Signed by:   Corwin Levins MD on 10/30/2009   Method used:   Print then Give to Patient   RxID:   [id number] HUMALOG KWIKPEN 100 UNIT/ML SOLN (INSULIN LISPRO (HUMAN)) use asd SSI with checking blood sugars four times per day  #1 box x 5   Entered and Authorized by:   Corwin Levins MD   Signed by:   Corwin Levins  MD on 10/30/2009   Method used:   Print then Give to Patient   RxID:   [id number]

## 2010-07-22 NOTE — Assessment & Plan Note (Signed)
Summary: WEAK--NOT FEELING WELL AT ALL X 1 WK--STC   Vital Signs:  Patient profile:   60 year old female Height:      64 inches Weight:      280.50 pounds BMI:     48.32 O2 Sat:      96 % on Room air Temp:     98.4 degrees F oral Pulse rate:   78 / minute BP supine:   142 / 82  (right arm) BP sitting:   130 / 82  (right arm) BP standing:   132 / 82  (right arm) Cuff size:   large  Vitals Entered By: Zella Ball Ewing CMA Duncan Dull) (January 31, 2010 2:34 PM)  O2 Flow:  Room air CC: Weak for 2 weeks, refills/RE CBG Result 279   Primary Care Provider:  Corine Shelter, MD  CC:  Weak for 2 weeks and refills/RE.  History of Present Illness: here with 5-6 days acute onset generalized weakness and feeling poorly but hard to be more specific; seems to be urinating more freq, so stopped taking her lasix for this time;  has not been checking her cbg's or taking the humalog;  has been some thirsty but denies dizziness.  has some bilat upper abd tenderness of the rib cage and mid abd but no n/v;  last BM this AM without blood.  o/w Pt denies other CP, sob, doe, wheezing, orthopnea, pnd, worsening LE edema, palps, dizziness or syncope .  Pt denies new neuro symptoms such as headache, facial or extremity weakness  No fever, wt loss, night sweats, but has some loss of appetitie and just feels "bad."  Denies headache, ST, cough, n/v/d, rash, joint pain, dysuria, urgency or hematoria.  No other overt bleeding or blood loss.   Lives alone, has mild depression but denies worsening suicidal ideation or panic.    CBG in the office 279 - no insulin given  Problems Prior to Update: 1)  Vitamin D Deficiency  (ICD-268.9) 2)  Preventive Health Care  (ICD-V70.0) 3)  Hip Pain, Right  (ICD-719.45) 4)  Menopausal Disorder  (ICD-627.9) 5)  COPD  (ICD-496) 6)  Anemia-nos  (ICD-285.9) 7)  Degenerative Joint Disease, Cervical Spine  (ICD-721.90) 8)  Depression  (ICD-311) 9)  Polyneuropathy  (ICD-357.9) 10)  Obesity   (ICD-278.00) 11)  Chest Pain  (ICD-786.50) 12)  Gerd  (ICD-530.81) 13)  Nausea  (ICD-787.02) 14)  Asthma  (ICD-493.90) 15)  Sleep Apnea  (ICD-780.57) 16)  Bronchitis  (ICD-490) 17)  Hypertension  (ICD-401.9) 18)  Back Pain  (ICD-724.5) 19)  Diab w/o Comp Type Ii/uns Not Stated Uncntrl  (ICD-250.00) 20)  Sinus Tarsi Syndrome  (ICD-726.79) 21)  Achilles Tendinitis  (ICD-726.71) 22)  Foot Pain  (ICD-729.5)  Medications Prior to Update: 1)  Nexium 40 Mg Cpdr (Esomeprazole Magnesium) .... Take 1 Tablet By Mouth Two Times A Day 2)  Nitroglycerin 0.2 Mg/hr Pt24 (Nitroglycerin) .... Apply 1/4 Patch To Achilles Tendon Once Daily. 3)  Lantus 100 Unit/ml Soln (Insulin Glargine) .... Take 100 Units Every Evening. 4)  Advair Diskus 100-50 Mcg/dose Aepb (Fluticasone-Salmeterol) .... Take 2 Puffs Two Times A Day 5)  Tramadol Hcl 50 Mg Tabs (Tramadol Hcl) .... Take 1 Tablet By Mouth Four Times Daily 6)  Vesicare 10 Mg Tabs (Solifenacin Succinate) .... Take 1 Tablet By Mouth Once A Day 7)  Multivitamins  Tabs (Multiple Vitamin) .... Take 1 Tablet By Mouth Once A Day 8)  Vitamin D 1000 Unit Tabs (Cholecalciferol) .... Take 2  Tablets By Mouth Once Daily 9)  Metoprolol Tartrate 50 Mg Tabs (Metoprolol Tartrate) .... Take 1 1/2 Tablet Daily 10)  Promethazine Hcl 25 Mg Tabs (Promethazine Hcl) .... Take 1-2 Tablets Every 4-6 Hours As Needed For Nausea 11)  Maalox Advanced Max St 400-400-40 Mg/59ml Susp (Alum & Mag Hydroxide-Simeth) .... "turns The Bottle Up and Drinks" Three Times Daily 12)  Furosemide 40 Mg Tabs (Furosemide) .... Take 1 Tablet By Mouth Once A Day 13)  Diovan 320 Mg Tabs (Valsartan) .... Take 1 Tablet By Mouth Once A Day 14)  Aspirin 81 Mg Tbec (Aspirin) .... Take 1 Tablet By Mouth Once A Day 15)  Meperidine Hcl 50 Mg Tabs (Meperidine Hcl) .... Take 1 Tablet By Mouth Eveyr 6 Hours As Needed 16)  Promethazine-Codeine 6.25-10 Mg/64ml Syrp (Promethazine-Codeine) .... Take 1 Teaspoonful Every Four  Hours As Needed For Cough 17)  Neurontin 600 Mg Tabs (Gabapentin) .... Take 1 Tablet By Mouth Three Times A Day 18)  Clindamycin Hcl 150 Mg Caps (Clindamycin Hcl) .... Take 4 Capsules By Mouth 1 Hour Before Dental Procedures 19)  Klor-Con 10 10 Meq Cr-Tabs (Potassium Chloride) .... Take 1 Tablet By Mouth Two Times A Day 20)  Tylenol With Codeine #4 300-60 Mg Tabs (Acetaminophen-Codeine) .... Take 1 Tablet Every 6 Hours As Needed 21)  Diflucan 150 Mg Tabs (Fluconazole) .... Take 1 Tablet By Mouth As Needed Yeast Infection 22)  Albuterol Breathing Treatment .... Use As Needed 23)  Oxygen 2l .... At Bedtime 24)  Nexium 40 Mg Cpdr (Esomeprazole Magnesium) .... Take 1 Cap Twice Daily 25)  Humalog Kwikpen 100 Unit/ml Soln (Insulin Lispro (Human)) .... Use Asd Ssi With Checking Blood Sugars Four Times Per Day 26)  Humalog Kwikpen Needles .... Use Asd Four Times Per Day 27)  Advair Diskus 100-50 Mcg/dose Aepb (Fluticasone-Salmeterol) .Marland Kitchen.. 1 Puff Two Times A Day  Current Medications (verified): 1)  Nexium 40 Mg Cpdr (Esomeprazole Magnesium) .... Take 1 Tablet By Mouth Two Times A Day 2)  Nitroglycerin 0.2 Mg/hr Pt24 (Nitroglycerin) .... Apply 1/4 Patch To Achilles Tendon Once Daily. 3)  Lantus 100 Unit/ml Soln (Insulin Glargine) .... Take 100 Units Every Evening. 4)  Advair Diskus 100-50 Mcg/dose Aepb (Fluticasone-Salmeterol) .... Take 2 Puffs Two Times A Day 5)  Tramadol Hcl 50 Mg Tabs (Tramadol Hcl) .... Take 1 Tablet By Mouth Four Times Daily 6)  Vesicare 10 Mg Tabs (Solifenacin Succinate) .... Take 1 Tablet By Mouth Once A Day 7)  Multivitamins  Tabs (Multiple Vitamin) .... Take 1 Tablet By Mouth Once A Day 8)  Vitamin D 1000 Unit Tabs (Cholecalciferol) .... Take 2 Tablets By Mouth Once Daily 9)  Metoprolol Tartrate 50 Mg Tabs (Metoprolol Tartrate) .... Take 1 1/2 Tablet Daily 10)  Promethazine Hcl 25 Mg Tabs (Promethazine Hcl) .... Take 1-2 Tablets Every 4-6 Hours As Needed For Nausea 11)  Maalox  Advanced Max St 400-400-40 Mg/34ml Susp (Alum & Mag Hydroxide-Simeth) .... "turns The Bottle Up and Drinks" Three Times Daily 12)  Furosemide 40 Mg Tabs (Furosemide) .... Take 1 Tablet By Mouth Once A Day 13)  Diovan 320 Mg Tabs (Valsartan) .... Take 1 Tablet By Mouth Once A Day 14)  Aspirin 81 Mg Tbec (Aspirin) .... Take 1 Tablet By Mouth Once A Day 15)  Meperidine Hcl 50 Mg Tabs (Meperidine Hcl) .... Take 1 Tablet By Mouth Eveyr 6 Hours As Needed 16)  Promethazine-Codeine 6.25-10 Mg/62ml Syrp (Promethazine-Codeine) .... Take 1 Teaspoonful Every Four Hours As Needed For  Cough 17)  Neurontin 600 Mg Tabs (Gabapentin) .... Take 1 Tablet By Mouth Three Times A Day 18)  Clindamycin Hcl 150 Mg Caps (Clindamycin Hcl) .... Take 4 Capsules By Mouth 1 Hour Before Dental Procedures 19)  Klor-Con 10 10 Meq Cr-Tabs (Potassium Chloride) .... Take 1 Tablet By Mouth Two Times A Day 20)  Tylenol With Codeine #4 300-60 Mg Tabs (Acetaminophen-Codeine) .... Take 1 Tablet Every 6 Hours As Needed 21)  Diflucan 150 Mg Tabs (Fluconazole) .... Take 1 Tablet By Mouth As Needed Yeast Infection 22)  Albuterol Breathing Treatment .... Use As Needed 23)  Oxygen 2l .... At Bedtime 24)  Nexium 40 Mg Cpdr (Esomeprazole Magnesium) .... Take 1 Cap Twice Daily 25)  Humalog Kwikpen 100 Unit/ml Soln (Insulin Lispro (Human)) .... Use Asd Ssi With Checking Blood Sugars Four Times Per Day 26)  Humalog Kwikpen Needles .... Use Asd Four Times Per Day 27)  Advair Diskus 100-50 Mcg/dose Aepb (Fluticasone-Salmeterol) .Marland Kitchen.. 1 Puff Two Times A Day  Allergies (verified): 1)  ! Pcn 2)  ! Ace Inhibitors 3)  ! * Tape  Past History:  Past Medical History: Last updated: 11/22/2009 GERD (ICD-530.81) OBESITY ASTHMA (ICD-493.90) SLEEP APNEA (ICD-780.57) BRONCHITIS (ICD-490) HYPERTENSION (ICD-401.9) BACK PAIN (ICD-724.5) DIAB W/O COMP TYPE II/UNS NOT STATED UNCNTRL (ICD-250.00) - Dr Everardo All SINUS TARSI SYNDROME (ICD-726.79) - Dr Darrick Penna -  podiatry ACHILLES TENDINITIS (ICD-726.71) FOOT PAIN (ICD-729.5) C-spine DJD - severe hx of dysphagia with severe dysmotility by barium swallow 2007 - Dr Lina Sar Anemia-NOS COPD  cardiology - Dr Julieanne Manson pulmonary - Dr Corine Shelter  Past Surgical History: Last updated: 11/22/2009 Laser Surgery-both eyes tonsillectomy back surgery-fusion  - mult  cervical spine levels - Dr Lovell Sheehan exision of abcess??? -chest Cholecystectomy D & C Exploratory laparotomy?? Partial Hysterectomy knee replacement-left - 2005 knee replacement-right - 2005 - Dr Turner Daniels foot surgery-right x 2  Family History: Last updated: 09/09/2009 No FH of Colon Cancer: Family History of Esophageal Cancer:Sister?, Brother Family History of Colon Polyps:Brother Family History of Pancreatic Cancer: Sister? Family History of Diabetes: Sister, Burnadette Pop Family History of Heart Disease: MGF  Social History: Last updated: 11/22/2009 Patient is a former smoker. -stopped 1988 Alcohol Use - no Illicit Drug Use - no Patient does not get regular exercise.  widowed 2004 disabled former work - Personnel officer  Risk Factors: Exercise: no (09/09/2009)  Risk Factors: Smoking Status: quit (09/09/2009)  Review of Systems  The patient denies anorexia, fever, vision loss, decreased hearing, hoarseness, syncope, dyspnea on exertion, peripheral edema, prolonged cough, headaches, hemoptysis, abdominal pain, melena, hematochezia, severe indigestion/heartburn, hematuria, muscle weakness, transient blindness, difficulty walking, unusual weight change, abnormal bleeding, enlarged lymph nodes, and angioedema.         all otherwise negative per pt -    Physical Exam  General:  alert and overweight-appearing.  , fatigued and weakend, needs an arm in support of getting up on the exam table. Head:  normocephalic and atraumatic.   Eyes:  vision grossly intact, pupils equal, and pupils round.   Ears:  bilat tm's  mild erythema Nose:  no external deformity and no nasal discharge.   Mouth:  ? mild pharyngeal erythema and fair dentition.   Neck:  supple and no masses.   Lungs:  normal respiratory effort, R decreased breath sounds, and L decreased breath sounds.   Heart:  normal rate and regular rhythm but tachy after getting up on exam table Abdomen:  soft, non-tender, and normal bowel sounds.  except for mild epigastric tender without guarding or rebound Msk:  has mod tender to the costal margins ant rib cage bialt without erythema, sweling or rash Extremities:  no edema, no erythema  Neurologic:  not confused, gross motor normal Skin:  color normal and no rashes.     Impression & Recommendations:  Problem # 1:  DEHYDRATION (ICD-276.51) moderate I suspect, despite pt not taking the lasix in the past  5 days; exact etiology unclear, except also suspect DM uncontrolled, and possibly some underlying infection  due to pt marked weakness, I have advised her friend with her drive her across the street to the [facility] ER for IVF's and and further eval and tx as indicated, I hope to include routine labs, a1c, UA/urine and blood cultures  no other specific eval/treatment or antibx given today;  pt directed to the ER  Problem # 2:  CHEST PAIN (ICD-786.50) ecg reviewed;  exam c/w msk most likely;  should have cxr to further eval  - r/o pna or other, consdier CE's as well Orders: EKG w/ Interpretation (93000)  Problem # 3:  DIAB W/O COMP TYPE II/UNS NOT STATED UNCNTRL (ICD-250.00)  Her updated medication list for this problem includes:    Lantus 100 Unit/ml Soln (Insulin glargine) .Marland Kitchen... Take 100 units every evening.    Diovan 320 Mg Tabs (Valsartan) .Marland Kitchen... Take 1 tablet by mouth once a day    Aspirin 81 Mg Tbec (Aspirin) .Marland Kitchen... Take 1 tablet by mouth once a day    Humalog Kwikpen 100 Unit/ml Soln (Insulin lispro (human)) ..... Use asd ssi with checking blood sugars four times per day not taking the humalog for  some reason;  treat as above, f/u any worsening signs or symptoms   Problem # 4:  HYPERTENSION (ICD-401.9)  Her updated medication list for this problem includes:    Metoprolol Tartrate 50 Mg Tabs (Metoprolol tartrate) .Marland Kitchen... Take 1 1/2 tablet daily    Furosemide 40 Mg Tabs (Furosemide) .Marland Kitchen... Take 1 tablet by mouth once a day    Diovan 320 Mg Tabs (Valsartan) .Marland Kitchen... Take 1 tablet by mouth once a day  BP today: 130/82 Prior BP: 120/72 (11/22/2009)  Labs Reviewed: K+: 4.7 (11/22/2009) Creat: : 1.0 (11/22/2009)   Chol: 183 (11/22/2009)   HDL: 50.10 (11/22/2009)   LDL: 113 (11/22/2009)   TG: 100.0 (11/22/2009) stable overall by hx and exam, ok to continue meds/tx as is   Complete Medication List: 1)  Nexium 40 Mg Cpdr (Esomeprazole magnesium) .... Take 1 tablet by mouth two times a day 2)  Nitroglycerin 0.2 Mg/hr Pt24 (Nitroglycerin) .... Apply 1/4 patch to achilles tendon once daily. 3)  Lantus 100 Unit/ml Soln (Insulin glargine) .... Take 100 units every evening. 4)  Advair Diskus 100-50 Mcg/dose Aepb (Fluticasone-salmeterol) .... Take 2 puffs two times a day 5)  Tramadol Hcl 50 Mg Tabs (Tramadol hcl) .... Take 1 tablet by mouth four times daily 6)  Vesicare 10 Mg Tabs (Solifenacin succinate) .... Take 1 tablet by mouth once a day 7)  Multivitamins Tabs (Multiple vitamin) .... Take 1 tablet by mouth once a day 8)  Vitamin D 1000 Unit Tabs (Cholecalciferol) .... Take 2 tablets by mouth once daily 9)  Metoprolol Tartrate 50 Mg Tabs (Metoprolol tartrate) .... Take 1 1/2 tablet daily 10)  Promethazine Hcl 25 Mg Tabs (Promethazine hcl) .... Take 1-2 tablets every 4-6 hours as needed for nausea 11)  Maalox Advanced Max St 400-400-40 Mg/75ml Susp (Alum & mag hydroxide-simeth) .... "  turns the bottle up and drinks" three times daily 12)  Furosemide 40 Mg Tabs (Furosemide) .... Take 1 tablet by mouth once a day 13)  Diovan 320 Mg Tabs (Valsartan) .... Take 1 tablet by mouth once a day 14)  Aspirin 81  Mg Tbec (Aspirin) .... Take 1 tablet by mouth once a day 15)  Meperidine Hcl 50 Mg Tabs (Meperidine hcl) .... Take 1 tablet by mouth eveyr 6 hours as needed 16)  Promethazine-codeine 6.25-10 Mg/25ml Syrp (Promethazine-codeine) .... Take 1 teaspoonful every four hours as needed for cough 17)  Neurontin 600 Mg Tabs (Gabapentin) .... Take 1 tablet by mouth three times a day 18)  Clindamycin Hcl 150 Mg Caps (Clindamycin hcl) .... Take 4 capsules by mouth 1 hour before dental procedures 19)  Klor-con 10 10 Meq Cr-tabs (Potassium chloride) .... Take 1 tablet by mouth two times a day 20)  Tylenol With Codeine #4 300-60 Mg Tabs (Acetaminophen-codeine) .... Take 1 tablet every 6 hours as needed 21)  Diflucan 150 Mg Tabs (Fluconazole) .... Take 1 tablet by mouth as needed yeast infection 22)  Albuterol Breathing Treatment  .... Use as needed 23)  Oxygen 2l  .... At bedtime 24)  Nexium 40 Mg Cpdr (Esomeprazole magnesium) .... Take 1 cap twice daily 25)  Humalog Kwikpen 100 Unit/ml Soln (Insulin lispro (human)) .... Use asd ssi with checking blood sugars four times per day 26)  Humalog Kwikpen Needles  .... Use asd four times per day 27)  Advair Diskus 100-50 Mcg/dose Aepb (Fluticasone-salmeterol) .Marland Kitchen.. 1 puff two times a day  Patient Instructions: 1)  please go to [facility] ER for further eval and treatment 2)  Continue all previous medications as before this visit  3)  Please schedule a follow-up appointment in 2 weeks, or sooner if needed  Laboratory Results   Blood Tests     CBG Random:: 279mg /dL

## 2010-07-24 NOTE — Progress Notes (Signed)
Summary: Call Report  Phone Note Other Incoming   Caller: [facility] Summary of Call: [facility] Triage Call Report Triage Record Num: [id number] Operator: Frederico Hamman Patient Name: Christy May Call Date & Time: 06/23/2010 8:45:48PM Patient Phone: PCP: Romero Belling Patient Gender: Female PCP Fax : [phone] Patient DOB: [birth year] Practice Name: [facility] Reason for Call: Patsy Lager from [facility] is calling, regarding a prescription written by Dr Everardo All for Lantus. States prescription will be cheaper if filled for 90 days. Dr. Posey Rea paged- Verbal order given to fill prescription for Lantus Insulin for 90 days. Verbal order given to [facility]. Office note Protocol(s) Used: Office Note Recommended Outcome per Protocol: Information Noted and Sent to Office Reason for Outcome: Caller information to office Care Advice:  ~ 01/ Initial call taken by: Margaret Pyle, CMA,  June 24, 2010 8:43 AM

## 2010-07-24 NOTE — Progress Notes (Signed)
Summary: Pt need Rx.  Phone Note Call from Patient   Reason for Call: Refill Medication Summary of Call: Patient called stating she needs a Rx for Tylenol #4 and Prometh w/codiene surup. Please Advise. [phone] [facility]) Initial call taken by: Daphane Shepherd,  July 01, 2010 1:41 PM  Follow-up for Phone Call        Tylenol fill dayes; 11/11, 11/29 and 12/22. [place] narc registry info on MD's desk for review as well. Follow-up by: Margaret Pyle, CMA,  July 01, 2010 2:22 PM  Additional Follow-up for Phone Call Additional follow up Details #1::        no need for both since tylenol #4 has codeine in it, and it appears she is taking the tylenol#4 on regular basis as she was given 15 day supply on dec 22  ok to supplement cough with delsym OTC if she wants  ok for tylenol #4 only, and please inform pt she will not be given further tramadol or demerol as it does not appear she needs this Additional Follow-up by: Corwin Levins MD,  July 01, 2010 5:19 PM    Additional Follow-up for Phone Call Additional follow up Details #2::    Pt advised of above, Rx faxed to [facility] per pt request Follow-up by: Margaret Pyle, CMA,  July 02, 2010 8:31 AM  New/Updated Medications: TYLENOL WITH CODEINE #4 300-60 MG TABS (ACETAMINOPHEN-CODEINE) Take 1 tablet every 6 hours as needed - to fill Jul 01, 2010 Prescriptions: TYLENOL WITH CODEINE #4 300-60 MG TABS (ACETAMINOPHEN-CODEINE) Take 1 tablet every 6 hours as needed - to fill Jul 01, 2010  #60 x 2   Entered and Authorized by:   Corwin Levins MD   Signed by:   Corwin Levins MD on 07/01/2010   Method used:   Print then Give to Patient   RxID:   [id number]   done hardcopy to LIM side B - dahlia Corwin Levins MD  July 01, 2010 5:25 PM

## 2010-07-24 NOTE — Progress Notes (Signed)
Summary: PA-Lantus  Phone Note From Pharmacy   Summary of Call: PA-Lantus [facility] , form to Dr Everardo All to complete. Dagoberto Reef  July 10, 2010 3:39 PM Form faxed to [employer] @ [phone], awaiting approval. Dagoberto Reef  July 11, 2010 3:37 PM Insurance denied .  Initial call taken by: Dagoberto Reef,  July 14, 2010 2:50 PM  Follow-up for Phone Call        please advise f/u ov next available Follow-up by: Minus Breeding MD,  July 15, 2010 7:48 AM  Additional Follow-up for Phone Call Additional follow up Details #1::        left message for pt to callback office Additional Follow-up by: Brenton Grills CMA Duncan Dull),  July 15, 2010 2:21 PM    Additional Follow-up for Phone Call Additional follow up Details #2::    pt informed of MD's advisement, transferred to [facility] to schedule F/U OV next available Follow-up by: Brenton Grills CMA Duncan Dull),  July 15, 2010 5:00 PM

## 2010-07-24 NOTE — Assessment & Plan Note (Signed)
Summary: Christy May/John/#/diabetes/cd   Vital Signs:  Patient profile:   60 year old female Menstrual status:  hysterectomy Height:      64 inches (162.56 cm) Weight:      293.13 pounds (133.24 kg) BMI:     50.50 O2 Sat:      98 % on Room air Temp:     98.0 degrees F (36.67 degrees C) oral Pulse rate:   95 / minute BP sitting:   128 / 82  (left arm) Cuff size:   large  Vitals Entered By: Brenton Grills CMA Duncan Dull) (June 06, 2010 3:40 PM)  O2 Flow:  Room air CC: Christy May Consult/DM/Dr. John/pt is no longer taking Nexium and has never taking Humalog insulin/pt declined flu shot/aj Is Patient Diabetic? Yes Comments Pt is due for mammogram     Menstrual Status hysterectomy   Referring Shaterria Sager:  n/a  CC:  Christy May Consult/DM/Dr. John/pt is no longer taking Nexium and has never taking Humalog insulin/pt declined flu shot/aj.  History of Present Illness: pt was last seen here 9 mos ago.  insulin was increased, but pt describes herself as "discouraged."   she takes lantus 65 units two times a day.  she has not started the humalog.  pt says she seldom checks cbg at home.  when she does, it is 300-400.  pt says eye surgery has been delayed because of severe hyperglycemia.   she says her diet and activity are improved.    Current Medications (verified): 1)  Nexium 40 Mg Cpdr (Esomeprazole Magnesium) .... Take 1 Tablet By Mouth Two Times A Day 2)  Nitroglycerin 0.2 Mg/hr Pt24 (Nitroglycerin) .... Apply 1/4 Patch To Achilles Tendon Once Daily. 3)  Lantus 100 Unit/ml Soln (Insulin Glargine) .... 65 Units Subcutaneously Two Times A Day 4)  Advair Diskus 100-50 Mcg/dose Aepb (Fluticasone-Salmeterol) .... Take 2 Puffs Two Times A Day 5)  Tramadol Hcl 50 Mg Tabs (Tramadol Hcl) .... Take 1 Tablet By Mouth Four Times Daily 6)  Vesicare 10 Mg Tabs (Solifenacin Succinate) .... Take 1 Tablet By Mouth Once A Day 7)  Multivitamins  Tabs (Multiple Vitamin) .... Take 1 Tablet By Mouth Once A Day 8)   Vitamin D 1000 Unit Tabs (Cholecalciferol) .... Take 2 Tablets By Mouth Once Daily 9)  Metoprolol Tartrate 50 Mg Tabs (Metoprolol Tartrate) .... Take 1 1/2 Tablet Two Times A Day 10)  Promethazine Hcl 25 Mg Tabs (Promethazine Hcl) .... Take 1-2 Tablets Every 4-6 Hours As Needed For Nausea 11)  Maalox Advanced Max St 400-400-40 Mg/35ml Susp (Alum & Mag Hydroxide-Simeth) .... "turns The Bottle Up and Drinks" Three Times Daily 12)  Furosemide 40 Mg Tabs (Furosemide) .... Take 1 Tablet By Mouth Once A Day 13)  Diovan 320 Mg Tabs (Valsartan) .... Take 1 Tablet By Mouth Once A Day 14)  Aspirin 81 Mg Tbec (Aspirin) .... Take 1 Tablet By Mouth Once A Day 15)  Meperidine Hcl 50 Mg Tabs (Meperidine Hcl) .... Take 1 Tablet By Mouth Eveyr 6 Hours As Needed 16)  Promethazine-Codeine 6.25-10 Mg/65ml Syrp (Promethazine-Codeine) .... Take 1 Teaspoonful Every Four Hours As Needed For Cough 17)  Neurontin 600 Mg Tabs (Gabapentin) .... Take 1 Tablet By Mouth Three Times A Day 18)  Clindamycin Hcl 150 Mg Caps (Clindamycin Hcl) .... Take 4 Capsules By Mouth 1 Hour Before Dental Procedures 19)  Klor-Con 10 10 Meq Cr-Tabs (Potassium Chloride) .... Take 1 Tablet By Mouth Two Times A Day 20)  Tylenol With  Codeine #4 300-60 Mg Tabs (Acetaminophen-Codeine) .... Take 1 Tablet Every 6 Hours As Needed - To Fill May 01, 2010 21)  Albuterol Breathing Treatment .... Use As Needed 22)  Oxygen 2l .... At Bedtime 23)  Humalog Kwikpen 100 Unit/ml Soln (Insulin Lispro (Human)) .... Use Asd Ssi With Checking Blood Sugars Four Times Per Day 24)  Humalog Kwikpen Needles .... Use Asd Four Times Per Day 25)  Advair Diskus 100-50 Mcg/dose Aepb (Fluticasone-Salmeterol) .Marland Kitchen.. 1 Puff Two Times A Day 26)  Omeprazole 20 Mg Cpdr (Omeprazole) .Marland Kitchen.. 1 By Mouth Bid 27)  Levofloxacin 500 Mg Tabs (Levofloxacin) .Marland Kitchen.. 1po Once Daily 28)  Tramadol Hcl 50 Mg Tabs (Tramadol Hcl) .Marland Kitchen.. 1po Q 6 Hrs As Needed Pain  Allergies (verified): 1)  ! Pcn 2)  ! Ace  Inhibitors 3)  ! * Tape  Past History:  Past Medical History: Last updated: 11/22/2009 GERD (ICD-530.81) OBESITY ASTHMA (ICD-493.90) SLEEP APNEA (ICD-780.57) BRONCHITIS (ICD-490) HYPERTENSION (ICD-401.9) BACK PAIN (ICD-724.5) DIAB W/O COMP TYPE II/UNS NOT STATED UNCNTRL (ICD-250.00) - Dr Everardo All SINUS TARSI SYNDROME (ICD-726.79) - Dr Darrick Penna - podiatry ACHILLES TENDINITIS (ICD-726.71) FOOT PAIN (ICD-729.5) C-spine DJD - severe hx of dysphagia with severe dysmotility by barium swallow 2007 - Dr Lina Sar Anemia-NOS COPD  cardiology - Dr Julieanne Manson pulmonary - Dr Corine Shelter  Past Surgical History: Laser Surgery-both eyes tonsillectomy back surgery-fusion  - mult  cervical spine levels - Dr Lovell Sheehan exision of abcess??? -chest Cholecystectomy D & C Exploratory laparotomy?? Partial Hysterectomy-1980 knee replacement-left - 2005 knee replacement-right - 2005 - Dr Turner Daniels foot surgery-right x 2  Review of Systems  The patient denies hypoglycemia.    Physical Exam  General:  morbidly obese.  no distress. Pulses:  dorsalis pedis intact bilat.   Extremities:  no deformity.  no ulcer on the feet.  feet are of normal color and temp. 1+ right pedal edema and trace left pedal edema.   Neurologic:  sensation is intact to touch on the feet    Impression & Recommendations:  Problem # 1:  DIAB W/O COMP TYPE II/UNS NOT STATED UNCNTRL (ICD-250.00) Assessment Deteriorated very poor control  HgbA1C: 11.7 (05/27/2010)  Medications Added to Medication List This Visit: 1)  Lantus 100 Unit/ml Soln (Insulin glargine) .... 80 units subcutaneously two times a day  Other Orders: Est. Patient Level III (04540)  Patient Instructions: 1)  we will need to take this complex situation in stages 2)  for now, increase lantus to 80 units two times a day.  then increase to 85 units two times a day, then 90 two times a day, etc, until blood sugar comes down to the 100's at some  time of day.  3)  check your blood sugar 2 times a day.  vary the time of day when you check, between before the 3 meals, and at bedtime.  also check if you have symptoms of your blood sugar being too high or too low.  please keep a record of the readings and bring it to your next appointment here.  please call us sooner if you are having low blood sugar episodes.   4)  Please schedule a follow-up appointment in 3-4 weeks.   Prescriptions: LANTUS 100 UNIT/ML SOLN (INSULIN GLARGINE) 80 units subcutaneously two times a day  #6 vials x 11   Entered and Authorized by:   Minus Breeding MD   Signed by:   Minus Breeding MD on 06/06/2010   Method used:  Electronically to        [id number]* (retail)       [address] [place], [place]  [address]       Ph: [phone]       Fax: [phone]   RxID:   [id number]    Orders Added: 1)  Est. Patient Level III [87564]   Not Administered:    Influenza Vaccine not given due to: declined

## 2010-07-24 NOTE — Medication Information (Signed)
Summary: Query report/[facility]  Query report/[facility]   Imported By: Lester Saxis 07/07/2010 09:59:10  _____________________________________________________________________  External Attachment:    Type:   Image     Comment:   External Document

## 2010-07-24 NOTE — Progress Notes (Signed)
  Phone Note Refill Request Message from:  Fax from Pharmacy on July 08, 2010 4:44 PM  Refills Requested: Medication #1:  PROMETHAZINE HCL 25 MG TABS Take 1-2 tablets every 4-6 hours as needed for nausea   Dosage confirmed as above?Dosage Confirmed   Notes: Tree surgeon Initial call taken by: Scharlene Gloss CMA Duncan Dull),  July 08, 2010 4:45 PM    Prescriptions: PROMETHAZINE HCL 25 MG TABS (PROMETHAZINE HCL) Take 1-2 tablets every 4-6 hours as needed for nausea  #30 x 6   Entered by:   Scharlene Gloss CMA (AAMA)   Authorized by:   Corwin Levins MD   Signed by:   Scharlene Gloss CMA (AAMA) on 07/08/2010   Method used:   Faxed to ...       [facility] [id number]* (retail)       [address] [place], [place]  [address]       Ph: [phone]       Fax: [phone]   RxID:   [id number]

## 2010-07-30 NOTE — Assessment & Plan Note (Signed)
Summary: PER ASHLEY FU  STC   Vital Signs:  Patient profile:   60 year old female Menstrual status:  hysterectomy Height:      64 inches (162.56 cm) Weight:      297.13 pounds (135.06 kg) BMI:     51.19 O2 Sat:      98 % on Room air Temp:     98.9 degrees F (37.17 degrees C) oral Pulse rate:   85 / minute Pulse rhythm:   regular BP sitting:   126 / 76  (left arm) Cuff size:   large  Vitals Entered By: Brenton Grills CMA Duncan Dull) (July 17, 2010 4:23 PM)  O2 Flow:  Room air CC: F/U per phone note to discuss insulin/rx for Tramadol/aj Is Patient Diabetic? Yes   Referring Provider:  n/a Primary Provider:  Corine Shelter, MD  CC:  F/U per phone note to discuss insulin/rx for Tramadol/aj.  History of Present Illness: (emr not working at time of ov) pt says she has not been able to check cbg's, as her meter is not working.  pt states he feels well in general.   Current Medications (verified): 1)  Nitroglycerin 0.2 Mg/hr Pt24 (Nitroglycerin) .... Apply 1/4 Patch To Achilles Tendon Once Daily. 2)  Lantus 100 Unit/ml Soln (Insulin Glargine) .... 80 Units Subcutaneously Two Times A Day 3)  Vesicare 10 Mg Tabs (Solifenacin Succinate) .... Take 1 Tablet By Mouth Once A Day 4)  Multivitamins  Tabs (Multiple Vitamin) .... Take 1 Tablet By Mouth Once A Day 5)  Vitamin D 1000 Unit Tabs (Cholecalciferol) .... Take 2 Tablets By Mouth Once Daily 6)  Metoprolol Tartrate 50 Mg Tabs (Metoprolol Tartrate) .... Take 1 1/2 Tablet Two Times A Day 7)  Promethazine Hcl 25 Mg Tabs (Promethazine Hcl) .... Take 1-2 Tablets Every 4-6 Hours As Needed For Nausea 8)  Maalox Advanced Max St 400-400-40 Mg/24ml Susp (Alum & Mag Hydroxide-Simeth) .... "turns The Bottle Up and Drinks" Three Times Daily 9)  Furosemide 40 Mg Tabs (Furosemide) .... Take 1 Tablet By Mouth Once A Day 10)  Diovan 320 Mg Tabs (Valsartan) .... Take 1 Tablet By Mouth Once A Day 11)  Aspirin 81 Mg Tbec (Aspirin) .... Take 1 Tablet By  Mouth Once A Day 12)  Promethazine-Codeine 6.25-10 Mg/82ml Syrp (Promethazine-Codeine) .... Take 1 Teaspoonful Every Four Hours As Needed For Cough 13)  Neurontin 600 Mg Tabs (Gabapentin) .... Take 1 Tablet By Mouth Three Times A Day 14)  Clindamycin Hcl 150 Mg Caps (Clindamycin Hcl) .... Take 4 Capsules By Mouth 1 Hour Before Dental Procedures 15)  Klor-Con 10 10 Meq Cr-Tabs (Potassium Chloride) .... Take 1 Tablet By Mouth Two Times A Day 16)  Tylenol With Codeine #4 300-60 Mg Tabs (Acetaminophen-Codeine) .... Take 1 Tablet Every 6 Hours As Needed - To Fill Jul 01, 2010 17)  Albuterol Breathing Treatment .... Use As Needed 18)  Oxygen 2l .... At Bedtime 19)  Advair Diskus 100-50 Mcg/dose Aepb (Fluticasone-Salmeterol) .Marland Kitchen.. 1 Puff Two Times A Day 20)  Omeprazole 20 Mg Cpdr (Omeprazole) .Marland Kitchen.. 1 By Mouth Bid  Allergies (verified): 1)  ! Pcn 2)  ! Ace Inhibitors 3)  ! * Tape  Past History:  Past Medical History: Last updated: 11/22/2009 GERD (ICD-530.81) OBESITY ASTHMA (ICD-493.90) SLEEP APNEA (ICD-780.57) BRONCHITIS (ICD-490) HYPERTENSION (ICD-401.9) BACK PAIN (ICD-724.5) DIAB W/O COMP TYPE II/UNS NOT STATED UNCNTRL (ICD-250.00) - Dr Everardo All SINUS TARSI SYNDROME (ICD-726.79) - Dr Darrick Penna - podiatry ACHILLES TENDINITIS (ICD-726.71) FOOT PAIN (ICD-729.5)  C-spine DJD - severe hx of dysphagia with severe dysmotility by barium swallow 2007 - Dr Lina Sar Anemia-NOS COPD  cardiology - Dr Julieanne Manson pulmonary - Dr Corine Shelter  Review of Systems  The patient denies hypoglycemia.    Physical Exam  General:  morbidly obese.  no distress. Psych:  Alert and cooperative; normal mood and affect; normal attention span and concentration.     Impression & Recommendations:  Problem # 1:  DIAB W/O COMP TYPE II/UNS NOT STATED UNCNTRL (ICD-250.00) uncertain control  Medications Added to Medication List This Visit: 1)  Tramadol Hcl 50 Mg Tabs (Tramadol hcl) .Marland Kitchen.. 1 every 4 hrs as  needed for pain  Other Orders: Est. Patient Level III (04540)  Patient Instructions: 1)  here is a new meter 2)  call if cbg stays over 200, or if low. 3)  ret 6 weeks 4)  same insulin Prescriptions: TRAMADOL HCL 50 MG TABS (TRAMADOL HCL) 1 every 4 hrs as needed for pain  #50 x 1   Entered and Authorized by:   Minus Breeding MD   Signed by:   Minus Breeding MD on 07/20/2010   Method used:   Electronically to        [facility] [address] [phone]* (retail)       [address]       [address]       [place], [place]  [address]       Ph: [phone]       Fax: [phone]   RxID:   [id number]    Orders Added: 1)  Est. Patient Level III [64403]

## 2010-07-30 NOTE — Medication Information (Signed)
Summary: Prior autho & denied for Lantus/Conventry  Prior autho & denied for Lantus/Conventry   Imported By: Sherian Rein 07/21/2010 07:24:01  _____________________________________________________________________  External Attachment:    Type:   Image     Comment:   External Document

## 2010-08-12 ENCOUNTER — Encounter: Payer: Self-pay | Admitting: Internal Medicine

## 2010-08-19 ENCOUNTER — Encounter: Payer: Self-pay | Admitting: Endocrinology

## 2010-08-19 NOTE — Letter (Signed)
Summary: [facility]  [facility]   Imported By: Sherian Rein 08/15/2010 09:36:53  _____________________________________________________________________  External Attachment:    Type:   Image     Comment:   External Document

## 2010-08-20 ENCOUNTER — Telehealth: Payer: Self-pay | Admitting: Internal Medicine

## 2010-08-22 ENCOUNTER — Encounter: Payer: Self-pay | Admitting: Internal Medicine

## 2010-08-22 ENCOUNTER — Ambulatory Visit (INDEPENDENT_AMBULATORY_CARE_PROVIDER_SITE_OTHER): Payer: PRIVATE HEALTH INSURANCE | Admitting: Internal Medicine

## 2010-08-22 DIAGNOSIS — M479 Spondylosis, unspecified: Secondary | ICD-10-CM

## 2010-08-22 DIAGNOSIS — E119 Type 2 diabetes mellitus without complications: Secondary | ICD-10-CM

## 2010-08-22 DIAGNOSIS — R062 Wheezing: Secondary | ICD-10-CM

## 2010-08-22 DIAGNOSIS — J40 Bronchitis, not specified as acute or chronic: Secondary | ICD-10-CM

## 2010-08-28 NOTE — Progress Notes (Signed)
Summary: Rx req  Phone Note Call from Patient Call back at Home Phone [phone] [facility]     Caller: Patient Summary of Call: Pt called stating that PA for Lantus was denied in January but she was never prescribed an alternate insulin. Pt is now completely out and caanot affrod to pay of pocket for medcication. Can MD please Rx new insulin to pt, [facility]. Pt is also requesting cough syrup for cough and cold sxs. Initial call taken by: Margaret Pyle, CMA,  August 20, 2010 1:45 PM  Follow-up for Phone Call        ok for delsym otc AND mucinex for cold symtpoms   ok for lantus sample if we have  Ok to change to levemir (same dose) to see if this is ok with insurance - to robin to handle Follow-up by: Corwin Levins MD,  August 20, 2010 2:06 PM  Additional Follow-up for Phone Call Additional follow up Details #1::        called pt informed of above information. patient will come to office to get levemir sample and schedule office with Dr. Jonny Ruiz for cold. Additional Follow-up by: Robin Ewing CMA (AAMA),  August 20, 2010 4:22 PM    New/Updated Medications: LEVEMIR 100 UNIT/ML SOLN (INSULIN DETEMIR) 80 units subcutaneously two times a day Prescriptions: LEVEMIR 100 UNIT/ML SOLN (INSULIN DETEMIR) 80 units subcutaneously two times a day  #6 vials x 11   Entered by:   Zella Ball Ewing CMA (AAMA)   Authorized by:   Corwin Levins MD   Signed by:   Scharlene Gloss CMA (AAMA) on 08/20/2010   Method used:   Faxed to ...       [facility] [address] [phone]* (retail)       [address]       [address]       [place], [place]  [address]       Ph: [phone]       Fax: [phone]   RxID:   [id number]

## 2010-08-28 NOTE — Assessment & Plan Note (Signed)
Summary: cold/lb   Vital Signs:  Patient profile:   60 year old female Menstrual status:  hysterectomy Height:      64 inches Weight:      288.38 pounds BMI:     49.68 O2 Sat:      97 % on Room air Temp:     98.1 degrees F oral Pulse rate:   72 / minute BP sitting:   130 / 72  (left arm) Cuff size:   large  Vitals Entered By: Zella Ball Ewing CMA Duncan Dull) (August 22, 2010 3:33 PM)  O2 Flow:  Room air CC: Cough, congestion and chills/RE   Primary Care Provider:  Corine Shelter, MD  CC:  Cough and congestion and chills/RE.  History of Present Illness: here with acute onset 3 days worsening again illness; c/o fever, general weakness and malasie, HA, ST and worsening prod cough with greenish sputum and now mild wheezing/sob/doe today.  Pt denies CP, orthopnea, pnd, worsening LE edema, palps, dizziness or syncope  .  Pt denies new neuro symptoms such as headache, facial or extremity weakness  Pt denies polydipsia, polyuria, or low sugar symptoms such as shakiness improved with eating.  Overall good compliance with meds, trying to follow low chol, DM diet, wt stable, little excercise however  CBG's in mid 100's.  No recent wt loss, night sweats, loss of appetite or other constitutional symptoms . Overall good compliance with meds, and good tolerability.  Denies worsening depressive symptoms, suicidal ideation, or panic though has some ongoing anxiety without worsening recently.   Has ongoing pain to the neck posteriorly chronic without change, UE radicualr symptoms, bowel or bladder change, gait change, falls.    Problems Prior to Update: 1)  Urinary Frequency  (ICD-788.41) 2)  Wheezing  (ICD-786.07) 3)  Chest Pain  (ICD-786.50) 4)  Dehydration  (ICD-276.51) 5)  Vitamin D Deficiency  (ICD-268.9) 6)  Preventive Health Care  (ICD-V70.0) 7)  Hip Pain, Right  (ICD-719.45) 8)  Menopausal Disorder  (ICD-627.9) 9)  COPD  (ICD-496) 10)  Anemia-nos  (ICD-285.9) 11)  Degenerative Joint Disease,  Cervical Spine  (ICD-721.90) 12)  Depression  (ICD-311) 13)  Polyneuropathy  (ICD-357.9) 14)  Obesity  (ICD-278.00) 15)  Chest Pain  (ICD-786.50) 16)  Gerd  (ICD-530.81) 17)  Nausea  (ICD-787.02) 18)  Asthma  (ICD-493.90) 19)  Sleep Apnea  (ICD-780.57) 20)  Bronchitis  (ICD-490) 21)  Hypertension  (ICD-401.9) 22)  Back Pain  (ICD-724.5) 23)  Diab w/o Comp Type Ii/uns Not Stated Uncntrl  (ICD-250.00) 24)  Sinus Tarsi Syndrome  (ICD-726.79) 25)  Achilles Tendinitis  (ICD-726.71) 26)  Foot Pain  (ICD-729.5)  Medications Prior to Update: 1)  Nitroglycerin 0.2 Mg/hr Pt24 (Nitroglycerin) .... Apply 1/4 Patch To Achilles Tendon Once Daily. 2)  Lantus 100 Unit/ml Soln (Insulin Glargine) .... 80 Units Subcutaneously Two Times A Day 3)  Vesicare 10 Mg Tabs (Solifenacin Succinate) .... Take 1 Tablet By Mouth Once A Day 4)  Multivitamins  Tabs (Multiple Vitamin) .... Take 1 Tablet By Mouth Once A Day 5)  Vitamin D 1000 Unit Tabs (Cholecalciferol) .... Take 2 Tablets By Mouth Once Daily 6)  Metoprolol Tartrate 50 Mg Tabs (Metoprolol Tartrate) .... Take 1 1/2 Tablet Two Times A Day 7)  Promethazine Hcl 25 Mg Tabs (Promethazine Hcl) .... Take 1-2 Tablets Every 4-6 Hours As Needed For Nausea 8)  Maalox Advanced Max St 400-400-40 Mg/25ml Susp (Alum & Mag Hydroxide-Simeth) .... "turns The Bottle Up and Drinks" Three Times Daily  9)  Furosemide 40 Mg Tabs (Furosemide) .... Take 1 Tablet By Mouth Once A Day 10)  Diovan 320 Mg Tabs (Valsartan) .... Take 1 Tablet By Mouth Once A Day 11)  Aspirin 81 Mg Tbec (Aspirin) .... Take 1 Tablet By Mouth Once A Day 12)  Promethazine-Codeine 6.25-10 Mg/33ml Syrp (Promethazine-Codeine) .... Take 1 Teaspoonful Every Four Hours As Needed For Cough 13)  Neurontin 600 Mg Tabs (Gabapentin) .... Take 1 Tablet By Mouth Three Times A Day 14)  Clindamycin Hcl 150 Mg Caps (Clindamycin Hcl) .... Take 4 Capsules By Mouth 1 Hour Before Dental Procedures 15)  Klor-Con 10 10 Meq Cr-Tabs  (Potassium Chloride) .... Take 1 Tablet By Mouth Two Times A Day 16)  Tylenol With Codeine #4 300-60 Mg Tabs (Acetaminophen-Codeine) .... Take 1 Tablet Every 6 Hours As Needed - To Fill Jul 01, 2010 17)  Albuterol Breathing Treatment .... Use As Needed 18)  Oxygen 2l .... At Bedtime 19)  Advair Diskus 100-50 Mcg/dose Aepb (Fluticasone-Salmeterol) .Marland Kitchen.. 1 Puff Two Times A Day 20)  Omeprazole 20 Mg Cpdr (Omeprazole) .Marland Kitchen.. 1 By Mouth Bid 21)  Tramadol Hcl 50 Mg Tabs (Tramadol Hcl) .Marland Kitchen.. 1 Every 4 Hrs As Needed For Pain 22)  Levemir 100 Unit/ml Soln (Insulin Detemir) .... 80 Units Subcutaneously Two Times A Day  Current Medications (verified): 1)  Nitroglycerin 0.2 Mg/hr Pt24 (Nitroglycerin) .... Apply 1/4 Patch To Achilles Tendon Once Daily. 2)  Lantus 100 Unit/ml Soln (Insulin Glargine) .... 80 Units Subcutaneously Two Times A Day 3)  Vesicare 10 Mg Tabs (Solifenacin Succinate) .... Take 1 Tablet By Mouth Once A Day 4)  Multivitamins  Tabs (Multiple Vitamin) .... Take 1 Tablet By Mouth Once A Day 5)  Vitamin D 1000 Unit Tabs (Cholecalciferol) .... Take 2 Tablets By Mouth Once Daily 6)  Metoprolol Tartrate 50 Mg Tabs (Metoprolol Tartrate) .... Take 1 1/2 Tablet Two Times A Day 7)  Promethazine Hcl 25 Mg Tabs (Promethazine Hcl) .... Take 1-2 Tablets Every 4-6 Hours As Needed For Nausea 8)  Maalox Advanced Max St 400-400-40 Mg/51ml Susp (Alum & Mag Hydroxide-Simeth) .... "turns The Bottle Up and Drinks" Three Times Daily 9)  Furosemide 40 Mg Tabs (Furosemide) .... Take 1 Tablet By Mouth Once A Day 10)  Diovan 320 Mg Tabs (Valsartan) .... Take 1 Tablet By Mouth Once A Day 11)  Aspirin 81 Mg Tbec (Aspirin) .... Take 1 Tablet By Mouth Once A Day 12)  Promethazine-Codeine 6.25-10 Mg/87ml Syrp (Promethazine-Codeine) .... Take 1 Teaspoonful Every Four Hours As Needed For Cough 13)  Neurontin 600 Mg Tabs (Gabapentin) .... Take 1 Tablet By Mouth Three Times A Day 14)  Clindamycin Hcl 150 Mg Caps (Clindamycin  Hcl) .... Take 4 Capsules By Mouth 1 Hour Before Dental Procedures 15)  Klor-Con 10 10 Meq Cr-Tabs (Potassium Chloride) .... Take 1 Tablet By Mouth Two Times A Day 16)  Albuterol Breathing Treatment .... Use As Needed 17)  Oxygen 2l .... At Bedtime 18)  Advair Diskus 100-50 Mcg/dose Aepb (Fluticasone-Salmeterol) .Marland Kitchen.. 1 Puff Two Times A Day 19)  Omeprazole 20 Mg Cpdr (Omeprazole) .Marland Kitchen.. 1 By Mouth Bid 20)  Tramadol Hcl 300 Mg Xr24h-Tab (Tramadol Hcl) .Marland Kitchen.. 1po Once Daily 21)  Levemir 100 Unit/ml Soln (Insulin Detemir) .... 80 Units Subcutaneously Two Times A Day 22)  Doxycycline Hyclate 100 Mg Caps (Doxycycline Hyclate) .Marland Kitchen.. 1po Two Times A Day 23)  Prednisone 10 Mg Tabs (Prednisone) .... 3po Qd For 3days, Then 2po Qd For 3days, Then 1po  Qd For 3days, Then Stop  Allergies (verified): 1)  ! Pcn 2)  ! Ace Inhibitors 3)  ! * Tape  Past History:  Past Medical History: Last updated: 11/22/2009 GERD (ICD-530.81) OBESITY ASTHMA (ICD-493.90) SLEEP APNEA (ICD-780.57) BRONCHITIS (ICD-490) HYPERTENSION (ICD-401.9) BACK PAIN (ICD-724.5) DIAB W/O COMP TYPE II/UNS NOT STATED UNCNTRL (ICD-250.00) - Dr Everardo All SINUS TARSI SYNDROME (ICD-726.79) - Dr Darrick Penna - podiatry ACHILLES TENDINITIS (ICD-726.71) FOOT PAIN (ICD-729.5) C-spine DJD - severe hx of dysphagia with severe dysmotility by barium swallow 2007 - Dr Lina Sar Anemia-NOS COPD  cardiology - Dr Julieanne Manson pulmonary - Dr Corine Shelter  Past Surgical History: Last updated: 06/06/2010 Laser Surgery-both eyes tonsillectomy back surgery-fusion  - mult  cervical spine levels - Dr Lovell Sheehan exision of abcess??? -chest Cholecystectomy D & C Exploratory laparotomy?? Partial Hysterectomy-1980 knee replacement-left - 2005 knee replacement-right - 2005 - Dr Turner Daniels foot surgery-right x 2  Social History: Last updated: 11/22/2009 Patient is a former smoker. -stopped 1988 Alcohol Use - no Illicit Drug Use - no Patient does not get  regular exercise.  widowed 2004 disabled former work - Personnel officer  Risk Factors: Exercise: no (09/09/2009)  Risk Factors: Smoking Status: quit (09/09/2009)  Review of Systems       all otherwise negative per pt -    Physical Exam  General:  alert and overweight-appearing., fatigued, mild ill appearing Head:  normocephalic and atraumatic.   Eyes:  vision grossly intact, pupils equal, and pupils round.   Ears:  bilat tm's erythema, canals clear, sinus nontender Nose:  nasal dischargemucosal pallor and mucosal edema.   Mouth:  pharyngeal erythema and fair dentition.   Neck:  supple and cervical lymphadenopathy.   Lungs:  normal respiratory effort, R decreased breath sounds, R wheezes, L decreased breath sounds, and L wheezes.   Heart:  normal rate and regular rhythm.   Msk:  nontender c-spine, no paravertebral tedner or swelling Extremities:  no edema, no erythema  Neurologic:  strength normal in all extremities and gait normal.     Impression & Recommendations:  Problem # 1:  BRONCHITIS (ICD-490)  Her updated medication list for this problem includes:    Promethazine-codeine 6.25-10 Mg/26ml Syrp (Promethazine-codeine) .Marland Kitchen... Take 1 teaspoonful every four hours as needed for cough    Clindamycin Hcl 150 Mg Caps (Clindamycin hcl) .Marland Kitchen... Take 4 capsules by mouth 1 hour before dental procedures    Advair Diskus 100-50 Mcg/dose Aepb (Fluticasone-salmeterol) .Marland Kitchen... 1 puff two times a day    Doxycycline Hyclate 100 Mg Caps (Doxycycline hyclate) .Marland Kitchen... 1po two times a day treat as above, f/u any worsening signs or symptoms   Take antibiotics and other medications as directed. Encouraged to push clear liquids, get enough rest, and take acetaminophen as needed. To be seen in 5-7 days if no improvement, sooner if worse.  Problem # 2:  WHEEZING (ICD-786.07)  recurrent after resolving with prior tx, liekly assoc with above - moderate today, for depomedrol IM today, and predpack for  home  Orders: Depo- Medrol 40mg  (J1030) Depo- Medrol 80mg  (J1040) Admin of Therapeutic Inj  intramuscular or subcutaneous (16109)  Problem # 3:  DEGENERATIVE JOINT DISEASE, CERVICAL SPINE (ICD-721.90) to try the newly generic once daily tramadol in lieu of the tylenol with codeine #4; treat as above, f/u any worsening signs or symptoms   Problem # 4:  DIAB W/O COMP TYPE II/UNS NOT STATED UNCNTRL (ICD-250.00)  Her updated medication list for this problem includes:    Lantus 100 Unit/ml  Soln (Insulin glargine) .Marland KitchenMarland KitchenMarland KitchenMarland Kitchen 80 units subcutaneously two times a day    Diovan 320 Mg Tabs (Valsartan) .Marland Kitchen... Take 1 tablet by mouth once a day    Aspirin 81 Mg Tbec (Aspirin) .Marland Kitchen... Take 1 tablet by mouth once a day    Levemir 100 Unit/ml Soln (Insulin detemir) .Marland KitchenMarland KitchenMarland KitchenMarland Kitchen 80 units subcutaneously two times a day  Labs Reviewed: Creat: 1.3 (05/27/2010)    Reviewed HgBA1c results: 11.7 (05/27/2010) stable overall by hx and exam, ok to continue meds/tx as is , Pt to cont DM diet, excercise, wt control efforts; to call for cbg > 200 on the steroid tx  Complete Medication List: 1)  Nitroglycerin 0.2 Mg/hr Pt24 (Nitroglycerin) .... Apply 1/4 patch to achilles tendon once daily. 2)  Lantus 100 Unit/ml Soln (Insulin glargine) .... 80 units subcutaneously two times a day 3)  Vesicare 10 Mg Tabs (Solifenacin succinate) .... Take 1 tablet by mouth once a day 4)  Multivitamins Tabs (Multiple vitamin) .... Take 1 tablet by mouth once a day 5)  Vitamin D 1000 Unit Tabs (Cholecalciferol) .... Take 2 tablets by mouth once daily 6)  Metoprolol Tartrate 50 Mg Tabs (Metoprolol tartrate) .... Take 1 1/2 tablet two times a day 7)  Promethazine Hcl 25 Mg Tabs (Promethazine hcl) .... Take 1-2 tablets every 4-6 hours as needed for nausea 8)  Maalox Advanced Max St 400-400-40 Mg/67ml Susp (Alum & mag hydroxide-simeth) .... "turns the bottle up and drinks" three times daily 9)  Furosemide 40 Mg Tabs (Furosemide) .... Take 1 tablet by  mouth once a day 10)  Diovan 320 Mg Tabs (Valsartan) .... Take 1 tablet by mouth once a day 11)  Aspirin 81 Mg Tbec (Aspirin) .... Take 1 tablet by mouth once a day 12)  Promethazine-codeine 6.25-10 Mg/41ml Syrp (Promethazine-codeine) .... Take 1 teaspoonful every four hours as needed for cough 13)  Neurontin 600 Mg Tabs (Gabapentin) .... Take 1 tablet by mouth three times a day 14)  Clindamycin Hcl 150 Mg Caps (Clindamycin hcl) .... Take 4 capsules by mouth 1 hour before dental procedures 15)  Klor-con 10 10 Meq Cr-tabs (Potassium chloride) .... Take 1 tablet by mouth two times a day 16)  Albuterol Breathing Treatment  .... Use as needed 17)  Oxygen 2l  .... At bedtime 18)  Advair Diskus 100-50 Mcg/dose Aepb (Fluticasone-salmeterol) .Marland Kitchen.. 1 puff two times a day 19)  Omeprazole 20 Mg Cpdr (Omeprazole) .Marland Kitchen.. 1 by mouth bid 20)  Tramadol Hcl 300 Mg Xr24h-tab (Tramadol hcl) .Marland Kitchen.. 1po once daily 21)  Levemir 100 Unit/ml Soln (Insulin detemir) .... 80 units subcutaneously two times a day 22)  Doxycycline Hyclate 100 Mg Caps (Doxycycline hyclate) .Marland Kitchen.. 1po two times a day 23)  Prednisone 10 Mg Tabs (Prednisone) .... 3po qd for 3days, then 2po qd for 3days, then 1po qd for 3days, then stop  Patient Instructions: 1)  you had the steroid shot today 2)  Please take all new medications as prescribed - the pain medicine, antibiotic, cough medicine, and prednisone 3)  Continue all previous medications as before this visit, except stop the tylenol with codeine 4)  Please continue to monitor your sugars, as they can be higher on the steroid treatment 5)  Please keep your appt with Dr Everardo All as reqeusted at your last visit (to be seen in 6 wks from jan 27) 6)  Please schedule a follow-up appointment in 6 months. for CPX with labs and: 7)  HbgA1C prior to visit, ICD-9: 250.02 8)  Urine Microalbumin prior to visit, ICD-9: Prescriptions: PREDNISONE 10 MG TABS (PREDNISONE) 3po qd for 3days, then 2po qd for 3days,  then 1po qd for 3days, then stop  #18 x 0   Entered and Authorized by:   Corwin Levins MD   Signed by:   Corwin Levins MD on 08/22/2010   Method used:   Print then Give to Patient   RxID:   [id number] DOXYCYCLINE HYCLATE 100 MG CAPS (DOXYCYCLINE HYCLATE) 1po two times a day  #20 x 0   Entered and Authorized by:   Corwin Levins MD   Signed by:   Corwin Levins MD on 08/22/2010   Method used:   Print then Give to Patient   RxID:   [id number] PROMETHAZINE-CODEINE 6.25-10 MG/5ML SYRP (PROMETHAZINE-CODEINE) Take 1 teaspoonful every four hours as needed for cough  #6oz x 1   Entered and Authorized by:   Corwin Levins MD   Signed by:   Corwin Levins MD on 08/22/2010   Method used:   Print then Give to Patient   RxID:   [id number] TRAMADOL HCL 300 MG XR24H-TAB (TRAMADOL HCL) 1po once daily  #30 x 5   Entered and Authorized by:   Corwin Levins MD   Signed by:   Corwin Levins MD on 08/22/2010   Method used:   Print then Give to Patient   RxID:   [id number]    Medication Administration  Injection # 1:    Medication: Depo- Medrol 40mg     Diagnosis: WHEEZING (ICD-786.07)    Route: IM    Site: RUOQ gluteus    Exp Date: 12/2012    Lot #: [id number]    Mfr: [employer]    Comments: Patient received 120mg  Depo-medrol    Patient tolerated injection without complications    Given by: Zella Ball Ewing CMA Duncan Dull) (August 22, 2010 4:09 PM)  Injection # 2:    Medication: Depo- Medrol 80mg     Diagnosis: WHEEZING (ICD-786.07)    Route: IM    Site: RUOQ gluteus    Exp Date: 12/2012    Lot #: [id number]    Mfr: [employer]    Given by: Zella Ball Ewing CMA Duncan Dull) (August 22, 2010 4:09 PM)  Orders Added: 1)  Depo- Medrol 40mg  [J1030] 2)  Depo- Medrol 80mg  [J1040] 3)  Admin of Therapeutic Inj  intramuscular or subcutaneous [96372] 4)  Est. Patient Level IV [42595]

## 2010-08-28 NOTE — Letter (Signed)
Summary: [facility]ucose [facility]  [facility]ucose Supplies/[facility] [place]   Imported By: Sherian Rein 08/21/2010 07:30:14  _____________________________________________________________________  External Attachment:    Type:   Image     Comment:   External Document

## 2010-09-05 LAB — URINALYSIS, ROUTINE W REFLEX MICROSCOPIC
Hgb urine dipstick: NEGATIVE
Nitrite: NEGATIVE
Specific Gravity, Urine: 1.025 (ref 1.005–1.030)
Urobilinogen, UA: 0.2 mg/dL (ref 0.0–1.0)
pH: 5.5 (ref 5.0–8.0)

## 2010-09-05 LAB — COMPREHENSIVE METABOLIC PANEL
ALT: 23 U/L (ref 0–35)
Albumin: 3.6 g/dL (ref 3.5–5.2)
Alkaline Phosphatase: 76 U/L (ref 39–117)
Calcium: 10 mg/dL (ref 8.4–10.5)
GFR calc Af Amer: >60 mL/min (ref >60)
Glucose, Bld: 183 mg/dL — ABNORMAL HIGH (ref 70–99)
Potassium: 4.4 mEq/L (ref 3.5–5.1)
Sodium: 139 mEq/L (ref 135–145)
Total Protein: 8 g/dL (ref 6.0–8.3)

## 2010-09-05 LAB — POCT CARDIAC MARKERS: Myoglobin, poc: 103 ng/mL (ref 12–200)

## 2010-09-05 LAB — CBC
HCT: 41.3 % (ref 36.0–46.0)
MCHC: 33.3 g/dL (ref 30.0–36.0)
Platelets: 172 10*3/uL (ref 150–400)
RDW: 15.6 % — ABNORMAL HIGH (ref 11.5–15.5)
WBC: 6.8 10*3/uL (ref 4.0–10.5)

## 2010-09-05 LAB — DIFFERENTIAL
Eosinophils Absolute: 0.1 10*3/uL (ref 0.0–0.7)
Lymphs Abs: 2.5 10*3/uL (ref 0.7–4.0)
Monocytes Absolute: 0.4 10*3/uL (ref 0.1–1.0)
Monocytes Relative: 7 % (ref 3–12)
Neutrophils Relative %: 55 % (ref 43–77)

## 2010-09-05 LAB — TSH: TSH: 2.679 u[IU]/mL (ref 0.350–4.500)

## 2010-09-05 LAB — LIPASE, BLOOD: Lipase: 29 U/L (ref 11–59)

## 2010-09-05 LAB — BRAIN NATRIURETIC PEPTIDE: Pro B Natriuretic peptide (BNP): <30 pg/mL (ref 0.0–100.0)

## 2010-09-09 ENCOUNTER — Other Ambulatory Visit: Payer: Self-pay | Admitting: Internal Medicine

## 2010-09-10 NOTE — Telephone Encounter (Signed)
Needs ROV, or to urgent care/ER

## 2010-09-19 ENCOUNTER — Encounter: Payer: Self-pay | Admitting: Internal Medicine

## 2010-09-20 ENCOUNTER — Encounter: Payer: Self-pay | Admitting: Family Medicine

## 2010-09-20 ENCOUNTER — Ambulatory Visit (INDEPENDENT_AMBULATORY_CARE_PROVIDER_SITE_OTHER): Payer: PRIVATE HEALTH INSURANCE | Admitting: Family Medicine

## 2010-09-20 VITALS — BP 142/80 | HR 74 | Temp 97.9°F | Wt 286.0 lb

## 2010-09-20 DIAGNOSIS — J441 Chronic obstructive pulmonary disease with (acute) exacerbation: Secondary | ICD-10-CM

## 2010-09-20 MED ORDER — MOXIFLOXACIN HCL 400 MG PO TABS: 400.0000 mg | ORAL_TABLET | Freq: Every day | ORAL | Status: AC

## 2010-09-20 MED ORDER — PREDNISONE 20 MG PO TABS: ORAL_TABLET | ORAL | Status: DC

## 2010-09-20 MED ORDER — PROMETHAZINE-CODEINE 6.25-10 MG/5ML PO SYRP: 5.0000 mL | ORAL_SOLUTION | ORAL | Status: DC | PRN

## 2010-09-20 NOTE — Patient Instructions (Addendum)
Start prednisone taper. Start course of antibiotics and make sure to complete. Use albuterol for wheezing. Mucinex DM during day, codeine with promethazine at night for cough. Rest and push fluids. Call if not turning corner in 48-72 hours. Go to ER if severe SOB.

## 2010-09-20 NOTE — Progress Notes (Signed)
Subjective:    Patient ID: Christy May, female    DOB: 11/14/1950, 60 y.o.   MRN: [id number]  Cough This is a new (COPD exacerbation several weeks ago.. treated with steroids, antibiotics and cough suppressant...got better ) problem. The current episode started 1 to 4 weeks ago. The problem has been gradually worsening. The cough is productive of sputum. Associated symptoms include chest pain, chills, headaches, nasal congestion, shortness of breath and wheezing. Pertinent negatives include no ear congestion, ear pain, fever, postnasal drip or sore throat. The symptoms are aggravated by nothing. She has tried OTC cough suppressant (Using advair only once a day. HAs albuterol nebs but has not used.) for the symptoms. The treatment provided mild relief. Her past medical history is significant for COPD.      Review of Systems  Constitutional: Positive for chills. Negative for fever.  HENT: Negative for ear pain, sore throat and postnasal drip.   Eyes: Negative for pain.  Respiratory: Positive for cough, shortness of breath and wheezing.   Cardiovascular: Positive for chest pain. Negative for leg swelling.       Chest pain chronic.. Muscle spasm diagnosed in past... Following surgery  Genitourinary: Negative for dysuria.  Neurological: Positive for headaches.       Objective:   Physical Exam  Constitutional: Vital signs are normal. She appears well-developed and well-nourished. She is cooperative.  Non-toxic appearance. She does not appear ill. No distress.       Morbidly obese femalel in NAD  HENT:  Head: Normocephalic.  Right Ear: Hearing, tympanic membrane, external ear and ear canal normal.  Left Ear: Hearing, tympanic membrane, external ear and ear canal normal.  Nose: Mucosal edema and rhinorrhea present. Right sinus exhibits no maxillary sinus tenderness and no frontal sinus tenderness. Left sinus exhibits no maxillary sinus tenderness and no frontal sinus tenderness.    Mouth/Throat: Uvula is midline, oropharynx is clear and moist and mucous membranes are normal.  Eyes: Conjunctivae, EOM and lids are normal. Pupils are equal, round, and reactive to light. No foreign bodies found.  Neck: Trachea normal and normal range of motion. Neck supple. Carotid bruit is not present. No mass and no thyromegaly present.  Cardiovascular: Normal rate, regular rhythm, S1 normal, S2 normal, normal heart sounds, intact distal pulses and normal pulses.  Exam reveals no gallop and no friction rub.   No murmur heard. Pulmonary/Chest: Effort normal. Not tachypneic. No respiratory distress. She has decreased breath sounds. She has wheezes. She has no rhonchi. She has no rales.  Abdominal: Soft. Normal appearance and bowel sounds are normal. She exhibits no distension, no fluid wave, no abdominal bruit and no mass. There is no hepatosplenomegaly. There is no tenderness. There is no rebound, no guarding and no CVA tenderness. No hernia.  Genitourinary: Vagina normal and uterus normal. No breast swelling, tenderness, discharge or bleeding. Pelvic exam was performed with patient prone. There is no rash, tenderness or lesion on the right labia. There is no rash, tenderness or lesion on the left labia. Uterus is not enlarged and not tender. Cervix exhibits motion tenderness. Cervix exhibits no discharge and no friability. Right adnexum displays no mass, no tenderness and no fullness. Left adnexum displays no mass, no tenderness and no fullness.  Lymphadenopathy:    She has no cervical adenopathy.    She has no axillary adenopathy.  Neurological: She is alert. She has normal strength. No cranial nerve deficit or sensory deficit.  Skin: Skin is warm, dry and intact.  No rash noted.  Psychiatric: Her speech is normal and behavior is normal. Judgment normal. Her mood appears not anxious. Cognition and memory are normal. She does not exhibit a depressed mood.          Assessment & Plan:

## 2010-10-01 ENCOUNTER — Telehealth: Payer: Self-pay | Admitting: Internal Medicine

## 2010-10-01 NOTE — Telephone Encounter (Signed)
PA requested for Tramadol HCL PA form requested [facility] @1 -[phone] 09/24/10 PA form forwarded to MD for completion 09/29/10 Completed PA form received & faxed for Approval 10/01/10 Approval received & faxed to pharmacy @ [phone]

## 2010-10-02 ENCOUNTER — Encounter: Payer: Self-pay | Admitting: Internal Medicine

## 2010-10-02 ENCOUNTER — Telehealth: Payer: Self-pay

## 2010-10-02 DIAGNOSIS — J449 Chronic obstructive pulmonary disease, unspecified: Secondary | ICD-10-CM | POA: Insufficient documentation

## 2010-10-02 NOTE — Telephone Encounter (Signed)
A user error has taken place: encounter opened in error, closed for administrative reasons.

## 2010-10-03 ENCOUNTER — Ambulatory Visit (INDEPENDENT_AMBULATORY_CARE_PROVIDER_SITE_OTHER)
Admission: RE | Admit: 2010-10-03 | Discharge: 2010-10-03 | Disposition: A | Discharge status: Home / Self Care | Payer: PRIVATE HEALTH INSURANCE | Source: Ambulatory Visit | Attending: Internal Medicine | Admitting: Internal Medicine

## 2010-10-03 ENCOUNTER — Encounter: Payer: Self-pay | Admitting: Internal Medicine

## 2010-10-03 ENCOUNTER — Ambulatory Visit (INDEPENDENT_AMBULATORY_CARE_PROVIDER_SITE_OTHER): Payer: PRIVATE HEALTH INSURANCE | Admitting: Internal Medicine

## 2010-10-03 VITALS — BP 130/70 | HR 87 | Temp 98.2°F | Ht 64.0 in | Wt 289.2 lb

## 2010-10-03 DIAGNOSIS — R059 Cough, unspecified: Secondary | ICD-10-CM | POA: Insufficient documentation

## 2010-10-03 DIAGNOSIS — J441 Chronic obstructive pulmonary disease with (acute) exacerbation: Secondary | ICD-10-CM | POA: Insufficient documentation

## 2010-10-03 DIAGNOSIS — R05 Cough: Secondary | ICD-10-CM

## 2010-10-03 DIAGNOSIS — Z0000 Encounter for general adult medical examination without abnormal findings: Secondary | ICD-10-CM | POA: Insufficient documentation

## 2010-10-03 DIAGNOSIS — E119 Type 2 diabetes mellitus without complications: Secondary | ICD-10-CM

## 2010-10-03 DIAGNOSIS — I10 Essential (primary) hypertension: Secondary | ICD-10-CM

## 2010-10-03 MED ORDER — PROMETHAZINE-CODEINE 6.25-10 MG/5ML PO SYRP: 5.0000 mL | ORAL_SOLUTION | ORAL | Status: AC | PRN

## 2010-10-03 MED ORDER — METOPROLOL TARTRATE 50 MG PO TABS: 75.0000 mg | ORAL_TABLET | Freq: Two times a day (BID) | ORAL | Status: DC

## 2010-10-03 MED ORDER — OMEPRAZOLE 20 MG PO CPDR: 20.0000 mg | DELAYED_RELEASE_CAPSULE | Freq: Two times a day (BID) | ORAL | Status: DC

## 2010-10-03 NOTE — Progress Notes (Signed)
Quick Note:  Voice message left on [employer] system - lab is negative, normal or otherwise stable, pt to continue same tx ______ 

## 2010-10-03 NOTE — Patient Instructions (Addendum)
Please take the generic prilosec at 20 mg twice per day (sent to your pharmacy) Continue all other medications as before, including the corrected metoprolol and cough medicine Please go to XRAY in the Basement for the x-ray test Please call the number on the [facility] (the [employer] System) for results of testing in 2-3 days Please return in 6 mo with Lab testing done 3-5 days before

## 2010-10-05 ENCOUNTER — Encounter: Payer: Self-pay | Admitting: Internal Medicine

## 2010-10-05 NOTE — Assessment & Plan Note (Signed)
stable overall by hx and exam, most recent lab reviewed with pt, and pt to continue medical treatment as before   BP Readings from Last 3 Encounters:  10/03/10 130/70  09/20/10 142/80  08/22/10 130/72    Lab Results  Component Value Date   WBC 6.4 05/28/2010   HGB 12.3 05/28/2010   HCT 36.1 05/28/2010   PLT 239.0 05/28/2010   CHOL 181 05/27/2010   TRIG 142.0 05/27/2010   HDL 54.40 05/27/2010   ALT 23 01/31/2010   AST 16 01/31/2010   NA 139 05/27/2010   K 4.6 05/27/2010   CL 101 05/27/2010   CREATININE 1.3* 05/27/2010   BUN 24* 05/27/2010   CO2 29 05/27/2010   TSH 2.679 01/31/2010   HGBA1C 11.7* 05/27/2010

## 2010-10-05 NOTE — Assessment & Plan Note (Signed)
With mild urinary incont, ? Somewhat worse per pt;  Will check cxr to r/o pna, rx with cough med and tx pending cxr results, for mucinex otc as well

## 2010-10-05 NOTE — Progress Notes (Signed)
Subjective:    Patient ID: Christy May, female    DOB: March 30, 1951, 60 y.o.   MRN: [id number]  HPI  Here to f/u;  Was seen her mar 2, tx for acute bronchitis, later seen mar 31 with onset wheezing tx with antibx and steroid taper,  But unfortunately though the wheezing is still improved, she c/o better then worse cough again in the past 3 days, more productive with greenish sputum, and assoc with left lateral chest pain worse with cough and pleuritic sharp pains to deep breaths gadually worse as well;  Denies increased fever, chills, or increased sob/wheezing, but general weakness and malasie worse as well.  Pt denies other chest pain, increased sob or doe, wheezing, orthopnea, PND, increased LE swelling, palpitations, dizziness or syncope.  Pt denies new neurological symptoms such as new headache, or facial or extremity weakness or numbness   Pt denies polydipsia, polyuria, or low sugar symptoms such as weakness or confusion improved with po intake.  Pt states overall good compliance with meds, trying to follow lower cholesterol, diabetic diet, wt overall stable but little exercise however.   CBG's somewhat higher recently near 200 with the steroid tx.  Denies worsening depressive symptoms, suicidal ideation, or panic, though has ongoing anxiety, not increased recently.    Pt denies recent wt loss, night sweats, loss of appetite, or other constitutional symptoms, more worried about the cough, pain, and ? Pna,  As well as embarrased about mild stress urinary incont with the cough. Past Medical History  Diagnosis Date  . Achilles bursitis or tendinitis   . Anemia, unspecified   . Extrinsic asthma, unspecified   . Backache, unspecified   . Bronchitis, not specified as acute or chronic   . Chest pain, unspecified   . Chronic airway obstruction, not elsewhere classified   . Spondylosis of unspecified site without mention of myelopathy   . Dehydration   . Depressive disorder, not elsewhere classified    . Type II or unspecified type diabetes mellitus without mention of complication, not stated as uncontrolled   . Pain in limb   . Esophageal reflux   . Pain in joint, pelvic region and thigh   . Unspecified essential hypertension   . Unspecified menopausal and postmenopausal disorder   . Nausea alone   . Obesity, unspecified   . Inflammatory and toxic neuropathy, unspecified   . Routine general medical examination at a health care facility   . Other enthesopathy of ankle and tarsus   . Unspecified sleep apnea   . Urinary frequency   . Unspecified vitamin D deficiency   . Wheezing   . Dysphagia 2007    historyof dysphagia with severe dysmotility by barium swallow-[name]  . COPD (chronic obstructive pulmonary disease) 10/02/2010   Past Surgical History  Procedure Date  . Laser sugery     bilateral eyes  . Tonsillectomy   . Back surgery     fusion-multiple cervical spine levels-Dr. Lovell Sheehan  . Excision of abcess     ?? Chest  . Cholecystectomy   . Dilation and curettage of uterus   . Exploratory laparotomy   . Partial hysterectomy   . Total knee arthroplasty 2005    Left  . Total knee arthroplasty 2005    Right- Dr. Turner Daniels  . Foot surgery     Right X 2    reports that she quit smoking about 24 years ago. She does not have any smokeless tobacco history on file. She reports that  she does not drink alcohol or use illicit drugs. family history includes Colon polyps in her brother; Diabetes in her sister and unspecified family member; Esophageal cancer in her brother and sister; Heart disease in her maternal grandfather; and Pancreatic cancer in her sister. Allergies  Allergen Reactions  . Ace Inhibitors   . Penicillins    Current Outpatient Prescriptions on File Prior to Visit  Medication Sig Dispense Refill  . albuterol (PROVENTIL) (2.5 MG/3ML) 0.083% nebulizer solution Take 2.5 mg by nebulization every 6 (six) hours as needed.        . cholecalciferol (VITAMIN D) 1000  UNITS tablet Take 2,000 Units by mouth daily.        . Fluticasone-Salmeterol (ADVAIR DISKUS) 100-50 MCG/DOSE AEPB Inhale 1 puff into the lungs every 12 (twelve) hours.        . gabapentin (NEURONTIN) 600 MG tablet Take 600 mg by mouth 3 (three) times daily.        . insulin detemir (LEVEMIR) 100 UNIT/ML injection Inject 80 Units into the skin 2 (two) times daily.        . Multiple Vitamin (MULTIVITAMIN) tablet Take 1 tablet by mouth daily.        . nitroGLYCERIN (NITRODUR - DOSED IN MG/24 HR) 0.2 mg/hr Place 0.25 patches onto the skin daily. To achilles tendon every day       . potassium chloride (KLOR-CON) 10 MEQ CR tablet Take 10 mEq by mouth 2 (two) times daily.        . solifenacin (VESICARE) 10 MG tablet Take 5 mg by mouth daily.        . traMADol (ULTRAM-ER) 300 MG 24 hr tablet Take 300 mg by mouth daily.        . valsartan (DIOVAN) 320 MG tablet Take 320 mg by mouth daily.        Marland Kitchen alum & mag hydroxide-simeth (MAALOX ADVANCED MAX ST) 400-400-40 MG/5ML suspension Take by mouth 3 (three) times daily. "turns bottle up and drinks"       . aspirin 81 MG tablet Take 81 mg by mouth daily.        . clindamycin (CLEOCIN) 150 MG capsule Take 150 mg by mouth as directed. Take 4 capsules 1 hour before dental procedures.        . furosemide (LASIX) 40 MG tablet Take 40 mg by mouth daily.        . insulin glargine (LANTUS) 100 UNIT/ML injection Inject 80 Units into the skin 2 (two) times daily.        . predniSONE (DELTASONE) 20 MG tablet 3 tabs by mouth daily x 3 days, then 2 tabs by mouth daily x 2 days then 1 tab by mouth daily x 2 days  15 tablet  0  . promethazine (PHENERGAN) 25 MG tablet Take 25-50 mg by mouth every 6 (six) hours as needed.         Review of Systems All otherwise neg per pt     Objective:   Physical Exam BP 130/70  Pulse 87  Temp(Src) 98.2 F (36.8 C) (Oral)  Ht 5\' 4"  (1.626 m)  Wt 289 lb 4 oz (131.203 kg)  BMI 49.65 kg/m2  SpO2 98% Physical Exam  VS  noted Constitutional: Pt appears well-developed and well-nourished. , obese, mild ill.   HENT: Head: Normocephalic.  Right Ear: External ear normal.  Bilat tm's mild erythema Left Ear: External ear normal.  Sinus nontender Eyes: Conjunctivae and EOM are normal. Pupils are equal,  round, and reactive to light.  Neck: Normal range of motion. Neck supple.  Cardiovascular: Normal rate and regular rhythm.   Pulmonary/Chest: Effort normal and breath sounds normal.  Abd:  Soft, NT, non-distended, + BS Neurological: Pt is alert. No cranial nerve deficit.  Skin: Skin is warm. No erythema.  Psychiatric: Pt behavior is normal. Thought content normal. 1+ nervous        Assessment & Plan:

## 2010-10-05 NOTE — Assessment & Plan Note (Signed)
Improved on exam, pt reassured, I dont feel she needs further steroid tx at this time, The current medical regimen is effective;  continue present plan and medications,  to f/u any worsening symptoms or concerns

## 2010-10-05 NOTE — Assessment & Plan Note (Signed)
By hx seems improved, but with recent severe uncontrolled; has been referred to endo for better control, Continue all other medications as before   Lab Results  Component Value Date   HGBA1C 11.7* 05/27/2010

## 2010-10-08 ENCOUNTER — Telehealth: Payer: Self-pay | Admitting: Internal Medicine

## 2010-10-08 NOTE — Telephone Encounter (Signed)
PA requested for Diovan 320mg  Tab PA form requested from [facility] @[phone] 10/03/10 Form received & forwarded to MD for completion 10/06/10 Completed form received & faxed for Approval 10/08/10

## 2010-10-09 NOTE — Telephone Encounter (Signed)
DENIAL for Diovan received. [employer] stating: "We denied this request because the member's Plan's coverage rules regarding stepped therapy require a trial of another drug first. Please determine if an alternative formulary medication may be appropriate for this condition".  Pt must have a trial and failure of losartan/losartan HCTZ and either Avapro/Avalide, Micardis/Micardis HCT, or Twynsta. Please Advise.

## 2010-10-29 ENCOUNTER — Other Ambulatory Visit: Payer: Self-pay | Admitting: Internal Medicine

## 2010-10-30 ENCOUNTER — Telehealth: Payer: Self-pay | Admitting: *Deleted

## 2010-10-30 NOTE — Telephone Encounter (Signed)
Note was sent to MD in April 2012 explaining that Insurance DENIED this PA [see below transcript]:  Burnard Leigh, [facility] 10/09/2010 10:47 AM Signed  DENIAL for Diovan received. [employer] stating:  "We denied this request because the member's Plan's coverage rules regarding stepped therapy require a trial of another drug first. Please determine if an alternative formulary medication may be appropriate for this condition".  Pt must have a trial and failure of losartan/losartan HCTZ and either Avapro/Avalide, Micardis/Micardis HCT, or Twynsta.  Please Advise. SHARON SCATES, CMA 10/08/2010 4:16 PM Signed  PA requested for Diovan 320mg  Tab  PA form requested from [facility] @1 [phone] 10/03/10  Form received & forwarded to MD for completion 10/06/10  Completed form received & faxed for Approval 10/08/10  Note forwarded to MD again for PA requested for Rx written for same drug 10/29/2010.

## 2010-11-03 ENCOUNTER — Telehealth: Payer: Self-pay | Admitting: Internal Medicine

## 2010-11-03 MED ORDER — IRBESARTAN 300 MG PO TABS: 300.0000 mg | ORAL_TABLET | Freq: Every day | ORAL | Status: DC

## 2010-11-03 NOTE — Telephone Encounter (Signed)
Call pharmacy informed of refill.

## 2010-11-03 NOTE — Telephone Encounter (Signed)
Please inform pt that we were required to change the diovan 320 mg to the avapro 300 mg due to insurance no longer paying for the diovs  These meds are similar in effectiveness, and she should see no change in side effects

## 2010-11-03 NOTE — Telephone Encounter (Signed)
Pt advised.

## 2010-11-04 NOTE — Cardiovascular Report (Signed)
[name], Christy May NO.:  [id number]   MEDICAL RECORD NO.:  [id number]          PATIENT TYPE:  OIB   LOCATION:  2869                         FACILITY:  [facility]   PHYSICIAN:  Thereasa Solo. Little, M.D. DATE OF BIRTH:  01-11-1951   DATE OF PROCEDURE:  03/14/2007  DATE OF DISCHARGE:                            CARDIAC CATHETERIZATION   INDICATIONS FOR TEST:  This 60 year old female has cardiovascular risk  factors.  She had been having episodes of chest pain and underwent a  nuclear study at [facility] that was positive for inferior  ischemia.  She is brought in for outpatient cardiac catheterization.   After obtaining the informed consent the patient was prepped and draped  in the usual sterile fashion exposing the right groin.  Using a long  Smart needle the Seldinger technique was employed, and a 6-French  introducer sheath was placed into the right femoral artery.  Left-and-  right coronary arteriography and ventriculography in the RAO projection  was performed.   COMPLICATIONS:  None.   TOTAL CONTRAST USED:  70 mL.   EQUIPMENT:  6-French Judkins configuration catheters.   RESULTS/HEMODYNAMIC MONITORING:  Central aortic pressure was 151/69.  Left ventricular pressure was 151/16.  The left ventricular end-  diastolic pressure was 15.   VENTRICULOGRAPHY:  Ventriculography in the RAO projection revealed an  ejection fraction of 45%.  PVCs were noted during the ventriculogram,  and the end-diastolic pressure was 15.  Her LV was slightly underfilled  and there ectopy.  Her ejection fraction at the time of the Cardiolite  study was in fact 60%, and I suspect she has a low normal ejection  fraction.   CORONARY ARTERIOGRAPHY:  There was no calcification appreciated in the  distribution of the coronary arteries.  1. Left main normal, it bifurcated.  2. Circumflex.  The circumflex gave rise to one very large OM vessel      that bifurcated.  The ongoing  circumflex in the AV groove was      relatively small.  3. __________  diagonal, this was a moderate-sized vessel free of      disease.  4. LAD.  The LAD crossed the apex of the heart and gave rise to a      moderate first diagonal vessel.  This system was free of disease.  5. Right coronary artery.  The right coronary was small and had a very      short PDA.  This system was free of disease.   CONCLUSION:  1. No evidence of coronary disease.  2. Low-normal ejection fraction (marked ectopy during the      ventriculogram).  3. It appears that this was a false-positive nuclear study.  I cannot      explain her chest pain from a cardiac standpoint.  She should be      able to go home later today with followup in my office, later in      the week for leg check.           ______________________________  Thereasa Solo. Little, M.D.     ABL/MEDQ  D:  03/14/2007  T:  03/14/2007  Job:  [id number]   cc:   Mina Marble, M.D.  Cath Lab  Dr. Lovell Sheehan

## 2010-11-04 NOTE — Assessment & Plan Note (Signed)
[facility]                         GASTROENTEROLOGY OFFICE NOTE   NATIVIDAD, SCHLOSSER                   MRN:          [id number]  DATE:07/19/2007                            DOB:          [birth year]    Ms. Townsel is a 60 year old African-American female with a history of  gastroesophageal reflux.  She also has a severe degenerative joint  disease of the cervical spine, obesity, diabetes, and has had dysphagia  with severe esophageal dysmotility on barium swallow in August 2007.  She underwent cervical fusion on 4 levels from C3 to C7, and there was a  mild circumferential narrowing of the esophageus that suggested thyroid  enlargement on barium swallow in August 2007.  She has been on high dose  Nexium 40 mg b.i.d., but still has substernal chest pain, which she  relates to reflux.  She has tried Reglan in the past.  She is also on  Zantac 150 mg b.i.d. but currently is out of medicine.   MEDICATIONS:  1. Lantus insulin 60 units daily.  2. Symlin pen 120 units t.i.d.  3. Klor-Con 10 mEq t.i.d.  4. Toprol XL 100 mg daily.  5. Iron supplements.  6. Glucophage 1000 mg b.i.d.  7. Effexor 150 mg daily.  8. Actos 50 mg daily.  9. Centrum Silver.  10.Advair inhaler.  11.Nexium 40 mg p.o. b.i.d.   PHYSICAL EXAM:  Blood pressure 142/82, pulse 80, and weight 292 pounds.  The patient was clearly overweight.  She was alert and oriented.  LUNGS:  Clear to auscultation.  NECK:  Supple.  No adenopathy.  COR:  Normal S1, normal S2.  ABDOMEN:  Protuberant, tense, tender along both costal margins, more so  at the left costal margin.  Even light touch at the 12th rib  precipitated a lot of tenderness and pain.  There was also some  subxiphoid and epigastric tenderness.  Her costochondral junctions were  not tender.  Lower abdomen was normal.  Bowel sounds were normoactive.   IMPRESSION:  2. A 60 year old African-American female with severe esophageal      dysmotility as per barium swallow in 2007 and gastroesophageal      reflux disease.  Essentially normal upper endoscopy in August 2007,      now with recurrent chest pain and acid reflux not controlled on      double-dose PPI.  Rule out new development of Barrett's esophagus.      Rule out esophagitis.  2. History of recent anemia.  3. Severe cervical spine disease status post fusion on 4 levels.  4. Chronic obstructive pulmonary disease with sleep apnea.  5. Diabetes mellitus, insulin-dependent.  The patient may have an      element of __________  neuropathy.   PLAN:  1. Carafate slurry 1 g b.i.d.  The patient did not want to take it      because of the cost of it.  In place of it, we will give her      Mylanta 2 tablespoons p.c. and at bedtime.  2. Check results of CBC and TSH with Dr. Roanna Raider, which was drawn  yesterday.  If not drawn, we will perform in our lab today.  3. Upper endoscopy scheduled with appropriate biopsies and possible      dilatation as needed.     Hedwig Morton. Juanda Chance, MD  Electronically Signed    DMB/MedQ  DD: 07/19/2007  DT: 07/19/2007  Job #: [id number]   cc:   Ocie Cornfield

## 2010-11-07 NOTE — Discharge Summary (Signed)
NAME:  Christy May, Christy May                      ACCOUNT NO.:  [id number]   MEDICAL RECORD NO.:  [id number]                   PATIENT TYPE:  IPS   LOCATION:  4037                                 FACILITY:  [facility]   PHYSICIAN:  Ranelle Oyster, M.D.             DATE OF BIRTH:  [birth year]   DATE OF ADMISSION:  02/14/2004  DATE OF DISCHARGE:  02/22/2004                                 DISCHARGE SUMMARY   DISCHARGE DIAGNOSES:  1.  Right total knee arthroplasty secondary to degenerative joint disease,      February 11, 2004.  Pain management:  Coumadin for deep vein thrombosis      prophylaxis.  2.  Insulin-dependent diabetes mellitus.  3.  Anemia.  4.  Hypertension.  5.  Gastroesophageal reflux disease.  6.  Depression.  7.  Chronic obstructive pulmonary disease.   HISTORY OF PRESENT ILLNESS:  A 60 year old female with history of left total  knee arthroplasty May 2005, received inpatient rehab services.  Now,  admitted  August 22 with end-stage changes of the right knee and no relief with  conservative care.  Underwent a right total knee arthroplasty August 22 per  Dr. Turner Daniels.  Placed on Coumadin for deep vein thrombosis prophylaxis,  weightbearing as tolerated.  Postoperative pain management.  Anemia 8.2 and  monitored.  No chest pain.  No shortness of breath. Admitted for  comprehensive rehab program.   PAST MEDICAL HISTORY:  See discharge diagnoses.   ALLERGIES:  1.  PENICILLIN.  2.  SULFA.  3.  ACE INHIBITORS.   No alcohol or tobacco.   SOCIAL HISTORY:  Lives with 68 year old father.  One-level home.  The  patient independent prior to admission.   MEDICATIONS PRIOR TO ADMISSION:  1.  Zantac.  2.  Avalide.  3.  Effexor.  4.  Actos.  5.  Glucophage.  6.  Potassium chloride.  7.  Lasix.  8.  Advair.  9.  Neurontin.  10. 70/30 insulin, 35 units in the a.m.  11. Lantus insulin 25 units in p.m.   HOSPITAL COURSE:  The patient with progressive gains while on rehab  services  with therapies initiated on a b.i.d. basis.  The following issues were  followed during patient's rehab course.  Pertaining to [name]'s right  total knee arthroplasty February 11, 2004; surgical site healing nicely,  neurovascular sensation remained intact.  She was ambulating with a walker.  Weightbearing as tolerated.  She was supervision on the rehab unit.  Pain  management ongoing with the use of OxyContin sustained release 20 mg tapered  accordingly as well as oxycodone as needed.  She remained on Coumadin for  deep vein thrombosis prophylaxis with latest INR of 2.0 on August 30.  She  would [facility].  Insulin dependent  diabetes mellitus with latest blood sugars of 146, 203 and 196.  Her 70/30  insulin had since been discontinued.  She continued on her Glucophage, Actos  and increase in her Lantus insulin.  She would follow Dr. Petra Kuba.  Blood  pressure is controlled with Avapro, Lasix, Hydrochlorothiazide and Cardizem.  Postoperative anemia stable with latest hemoglobin of 9.2.  This showed a  generous improvement from 8.3.  Her mood and spirits continued to improve  throughout her rehab stay.  Noted history of depression maintained on  Effexor.  She had no bowel or bladder disturbances.  Overall for her  functional mobility she was supervision with transfer, modified independence  propelling a wheelchair, supervision with steps, ambulating with a walker.  Home health therapies had been arranged.   DISCHARGE MEDICATIONS:  1.  Coumadin, latest dose of 10 mg adjusted accordingly to be discontinued      September 22.  2.  Pepcid 20 mg twice daily.  3.  Neurontin 600 mg three times daily.  4.  Avapro 300 mg daily.  5.  Glucophage 1000 mg twice daily.  6.  Actos 15 mg daily.  7.  Potassium chloride 10 mEq three times daily.  8.  Effexor 150 mg daily.  9.  Trinsicon twice daily.  10. Hydrochlorothiazide 12.5 mg daily.  11.  Lasix 40 mg daily.  12. Advair 100/50, one puff twice daily.  13. Cardizem 180 mg daily.  14. OxyContin sustained release 20 mg every 12 hours x1 week, then 10 mg x1      week and discontinue.  15. Lantus insulin 38 units at bedtime.  16. Oxycodone as needed for pain.   ACTIVITY:  As tolerated.   DIET:  Diabetic diet.   SPECIAL INSTRUCTIONS:  Home health nurse to check Coumadin therapy per  [facility].  She should follow up with Dr. Turner Daniels of orthopedic  services.      Christy May, P.A.                     Ranelle Oyster, M.D.    DA/MEDQ  D:  02/21/2004  T:  02/22/2004  Job:  [id number]   cc:   Feliberto Gottron. Turner Daniels, M.D.  [address]  [place]  [place] [address]  Fax: [phone]   Eino Farber., M.D.  [address] [place]  [place] [address]  Fax: [phone]

## 2010-11-07 NOTE — Group Therapy Note (Signed)
NAME:  Christy May, Christy May NO.:  [id number]   MEDICAL RECORD NO.:  [id number]          PATIENT TYPE:  WOC   LOCATION:  [facility]                   FACILITY:  [facility]   PHYSICIAN:  Tinnie Gens, MD        DATE OF BIRTH:  10/06/1950   DATE OF SERVICE:  03/02/2005                                    CLINIC NOTE   CHIEF COMPLAINT:  Pap smear.   HISTORY OF PRESENT ILLNESS:  This patient is a 59 year old patient who is  referred from Dr. Petra Kuba for a Pap smear.  The patient is status post  hysterectomy, by Dr. __________ some years ago.  Patient's never had a  history of abnormal Pap's.  She has had a mammogram this year.   PAST MEDICAL HISTORY:  Significant for arthritis, ulcer, heart disease,  asthma, high cholesterol, hypertension, diabetes.   PAST SURGICAL HISTORY:  She had a hysterectomy, she has a right and left  total knee replacement and neck and back surgery.   MEDICATIONS:  Aspirin, centrum, iron, ranitidine, Kaochlor, metformin,  Lyrica, Effexor XR, Tylenol with codeine, Toprol XL, Advair, Sanctura,  Actos, tramadol.   ALLERGIES:  Ace inhibitors, penicillin, and plastic tape.   OB HISTORY:  She is a G4, para 2.  With one living child.   GYN HISTORY:  She is status post hysterectomy and has been menopausal with  hot flashes for some time.   FAMILY HISTORY:  Significant for diabetes, coronary artery disease,  hypertension, throat and lung cancer, asthma, bronchitis and sleep apnea.   SOCIAL HISTORY:  No tobacco or alcohol use.   REVIEW OF SYSTEMS:  A 14 point review of systems are reviewed as diffusely  positive.  The rest of __________ the HPI but otherwise is all followed by  her regular physician.   PHYSICAL EXAMINATION:  VITAL SIGNS:  Noted on the chart.  GENERAL:  She is obese, stable and in no acute distress.  ABDOMEN:  Soft.  Nontender.  Nondistended.  HEENT:  Extra ocular movements intact.  VAGINA:  Atrophic and pale.  The cuff is without  lesion.  Pap smears were  taken.  PELVIC EXAM:  Remarkably limited by body habitus.   IMPRESSION:  T1 exam with Pap.  Patient is status post hysterectomy for  nondisplastic reasons and has a history of abnormal Pap.  She does not need  further Pap smears and pelvic is essentially not helpful in this obese  patient.  The patient should not need further GYN evaluation without signs  or symptoms.   PLAN:  Pap smear today.  None further needed.           ______________________________  Tinnie Gens, MD     TP/MEDQ  D:  03/03/2005  T:  03/03/2005  Job:  [id number]   cc:   Dr. Petra Kuba  Pulmonary Disease and Internal Medicine  Specialist and Chest Diseases  [place],

## 2010-11-07 NOTE — Op Note (Signed)
NAME:  DANELIA, SNODGRASS                      ACCOUNT NO.:  [id number]   MEDICAL RECORD NO.:  [id number]                   PATIENT TYPE:  OIB   LOCATION:  5037                                 FACILITY:  [facility]   PHYSICIAN:  Feliberto Gottron. Turner Daniels, M.D.                DATE OF BIRTH:  [birth year]   DATE OF PROCEDURE:  10/24/2003  DATE OF DISCHARGE:                                 OPERATIVE REPORT   PREOPERATIVE DIAGNOSIS:  MRI-proven right knee bucket handle medial meniscal  tear with significant arthritis that may be bone-on-bone.   POSTOPERATIVE DIAGNOSIS:  End-stage arthritis, bone-on-bone, right knee.   PROCEDURE:  Right knee arthroscopic evaluation followed by total knee  arthroplasty using Osteonics Scorpio components, 7 femur, 7 tibia, 5  modified medial patellar button, and a 10 PS spacer.   SURGEON:  Feliberto Gottron. Turner Daniels, M.D.   FIRST ASSISTANT:  Su Hilt, PA-C.   ANESTHESIA:  General endotracheal anesthesia.   ESTIMATED BLOOD LOSS:  Minimal.   FLUID REPLACEMENT:  1800 mL of Crystalloid.   DRAINS PLACED:  Two medium Hemovacs.   TOURNIQUET TIME:  One hour and 40 minutes.   INDICATIONS FOR PROCEDURE:  The patient is a 60 year old morbidly obese  woman weighing a little over 300 pounds with significant arthritis  of both  knees followed by one of my partners, [employer].  She failed  conservative treatment.  An MRI scan showed a possible bucket handle tear of  the medial meniscus and also she had near bone-on- bone arthritic changes by  x-ray.  She failed conservative treatment, anti-inflammatory medicines,  physical therapy, and cortisone injection, and desires elective arthroscopic  evaluation and treatment of her right knee followed by a probable, although  not certain, total knee arthroplasty if there are bone-on-bone arthritic  changes.  A plain radiograph showed 1 to 2 mm of bone remaining on the left  knee medially and almost none on the right knee, but the left knee was  the  one that was most symptomatic.   DESCRIPTION OF PROCEDURE:  The patient was identified by arm band and taken  to the operating room at [facility].  Appropriate anesthetic  monitors were attached.  General endotracheal anesthesia induced with the  patient in the supine position.  Lateral posts and foot positioner applied  to the table, and the right lower extremity prepped and draped in the usual  sterile fashion from the ankle to the mid thigh.  The inferomedial and  inferolateral parapatellar portal regions were then infiltrated with 2 to 3  mL of 0.5% Marcaine and epinephrine solution using a #11 blade.  Standard  inferomedial and inferolateral parapatellar portals were then made allowing  introduction of the arthroscope to the inferolateral portal and the outflow  through the inferomedial portal.  Diagnostic arthroscopy of the  patellofemoral joint revealed grade 3 arthritic changes.  Moving to the  medial side, the patient did have  a badly torn medial meniscus.  More  importantly, bone-on-bone arthritic changes over major portions of the  medial femoral condyle and medial tibial condyle, grade 3 to the lateral  compartment.  There was significant peripheral spurring as well.   Satisfied that the patient had end-stage arthritis, the arthroscopic  instruments were removed and the left lower extremity was then prepped and  draped in the usual sterile fashion for a total knee arthroplasty.  The limb  was wrapped in an Esmarch bandage.  Tourniquet was inflated to 350 mmHg.  An  anterior midline incision  about 18 cm in length was then made from a hand  breadth above the patella to a hand breadth below the patella and just  medial to the tibial tubercle.  Small bleeders in the skin and subcutaneous  tissue were identified and cauterized with the electrocautery.  Medial  parapatellar arthrotomy was then accomplished with a #10 blade and the Bovie  electrocautery.   Because of  the patient's obesity, we had to dissect some of the adipose  tissue off the lateral side of the patella to allow [place] to tuck the patella  laterally as we everted it and flex the knee.  Prepatellar fat pad and the  medial and lateral menisci as well as the cruciate ligaments were resected  at this point with the electrocautery, and the notch was widened with an  osteotome removing significant peripheral notch osteophytes.  While the knee  was still in extension, we also elevated the superficial medial collateral  ligament from anterior to posterior leaving it intact distally.  With the  knee hyperflexed and the posterior structures protected with a posteromedial  Z-retractor, a straight posterior McHale retractor and a lateral Homan  retractor, we then entered the proximal tibia with the Osteonics step drill  followed by the intramedullary rod and 0-degree posterior slope cutting  guide. We resected 1 to 2 mm of bone medially and about 8 to 9 mm of bone  laterally with the cutting guide pinned into place.  We then entered the  distal femur with the step drill 2 mm anterior to the PCL origin followed by  the IM rod and the cutting guide set to 5 degrees left allowing 10 mm of  bone to be resected from the high side.  We measured for a #7 femoral  component and pinned the chamfer cutting guide in 3 degrees of external  rotation and performed our anterior, posterior, and chamfer cuts without  difficulty followed by the Scorpio anterior and box cuts.  The everted  patella was then grasped with a patellar cutting guide.  The posterior 9 mm  of the patella were resected, sized for a #5 modified medial patellar  button, and drilled.  At this point, trial components were brought up.  A 7  left trial femoral component, 7 tibial base plate, and a 10 PS spacer.  The  knee came to full extension and flexed to the limit of her adipose tissue without liftoff.  The rotation of the tibial base plate was then  set in line  with the 2nd metatarsal and the trial components were removed.  With the  knee once again hyperflexed and the proximal tibia exposed, we then pinned  the tibial base plate in place and brought up the delta fit keel punches and  punched up to a 7,9 delta fit keel punch without difficulty.  Cemented  punch.   At this point all bony surfaces were  cleaned with a WaterPik and dried with  a suction and sponges.  A double batch of Palacos polymethylmethacrylate  cement with 1500 mg of Zinacef was then mixed and applied to all bony and  metallic mating surfaces except for the posterior condyles of the femur, and  in order we hammered into place a #7 tibial component, a #7 left femoral  component, snapped in the 10 PS spacer, and pressed the #5 modified medial  patellar button into place and held it with a clamp.  Excess cement was  removed with a Therapist, nutritional.  The knee was held in compression and the  cement allowed to cure.  We then once again checked our patellar tracking.  A small lateral release was required so that the patella tracks  with no  thumb, and we were satisfied with the patellar tracking.   At this point, the wound was once again irrigated out with normal saline  solution.  Medium Hemovacs were placed deep in the wound.  The parapatellar  arthrotomy was then closed with running #1 Vicryl suture, the subcutaneous  tissue with 0 and 2-0 undyed Vicryl suture, and the skin with skin staples.  A dressing of Xeroform and 4 x 4 dressing, sponges, Webril, and an Ace wrap  applied.  The patient was then awakened and taken to the recovery room  without difficulty.                                               Feliberto Gottron. Turner Daniels, M.D.    Ovid Curd  D:  10/24/2003  T:  10/25/2003  Job:  [id number]

## 2010-11-07 NOTE — Op Note (Signed)
NAME:  Christy May, Christy May NO.:  [id number]   MEDICAL RECORD NO.:  [id number]          PATIENT TYPE:  INP   LOCATION:  3103                         FACILITY:  [facility]   PHYSICIAN:  Cristi Loron, M.D.DATE OF BIRTH:  06/13/1951   DATE OF PROCEDURE:  08/11/2004  DATE OF DISCHARGE:                                 OPERATIVE REPORT   PREOPERATIVE DIAGNOSIS:  C3-4, C4-5, C5-6, C6-7 spondylosis, stenosis,  cervical radiculopathy, cervical myelopathy, cervicalgia.   POSTOPERATIVE DIAGNOSIS:  C3-4, C4-5, C5-6, C6-7 spondylosis, stenosis,  cervical radiculopathy, cervical myelopathy, cervicalgia.   OPERATION PERFORMED:  C3-4, C4-5, C5-6, C6-7 extensive anterior cervical  diskectomy/decompression; interbody iliac crest allograft arthrodesis;  anterior cervical plating (Codman Slim Lock titanium plate and screws).   SURGEON:  Cristi Loron, M.D.   ASSISTANT:  Payton Doughty, M.D.   ANESTHESIA:  General endotracheal.   ESTIMATED BLOOD LOSS:  200 mL.   SPECIMENS:  None.   DRAINS:  None.   COMPLICATIONS:  None.   INDICATIONS FOR PROCEDURE:  The patient is a 60 year old black female who  has suffered from neck pain and bilateral arm and hand pain consistent with  a cervical myelopathy.  She has failed medical management and was worked up  with a cervical MRI which demonstrated that the patient had severe stenosis  at C4-5 and C5-6 with more moderate stenosis at C3-4 and C6-7.  I discussed  the various treatment options with the patient  including surgery.  The  patient has weighed the risks, benefits and alternatives of surgery and has  elected to proceed with a four-level anterior cervical diskectomy, fusion  and plating.   DESCRIPTION OF PROCEDURE:  The patient was brought to the operating room by  the anesthesia team.  General endotracheal anesthesia was induced.  The  patient remained in supine position.  A roll was placed under the shoulders  to place the  neck in slight extension.  The anterior cervical region was  then prepared with Betadine scrub and Betadine solution.  Sterile drapes  were applied.  I then injected the area to be incised with Marcaine with  epinephrine solution, used a scalpel to make a transverse incision in the  patient's left anterior neck.  I used Metzenbaum scissors to divide the  platysma muscle and then to dissect medial to the sternocleidomastoid  muscle, jugular vein and carotid artery.  I bluntly dissected down toward  the anterior cervical spine, carefully identifying the esophagus and  retracted it medially.  I then used Kitner swabs to clear the soft tissue  from the anterior cervical spine and then inserted a bent spinal needle into  the upper exposed interspace.  I obtained intraoperative radiograph to  confirm our location.   There was a small artery coming off the carotid heading toward the  esophagus.  In order to get adequate exposure, I coagulated and divided this  artery and placed Hemoclips on the small artery.  I then used electrocautery  to detach the more medial border of the longus colli muscle bilaterally from  C3-4, 4-5, 5-6 and 6-7 intervertebral disk space.  I then inserted a Caspar  self-retaining retractor for exposure.  We began at C6-7, incised the C6-7  intervertebral disk and performed a partial diskectomy using pituitary  forceps.  We inserted distraction screws at C6 and C7.  I then distracted  the interspace and then used a high speed drill to decorticate vertebral end  plates at Z6-1, drill away the remainder of  C6-7 intervertebral disk and  drilled away some posterior spondylosis and thinned out the posterior  longitudinal ligament.  I then incised the thinned out ligament with the  arachnoid knife and removed it with the Kerrison punch undercutting the  vertebral end plates at W9-6, decompressing the thecal sac.  I then  performed a general foraminotomy about the bilateral C7  nerve roots  completing the decompression at this level.  I then repeated this procedure  in analogous fashion at C5-6, then C4-5, then C3-4, decompressing the thecal  sac at these levels as well as the bilateral C4, 5 and 6 nerve roots,  completing the decompression.   We now turned our attention to arthrodesis.  I obtained iliac crest  tricortical allograft bone graft and fashioned it to these approximate  dimensions.  7 mm in height, 1 cm in depth.  I inserted the bone graft into  the distracted C3-4 interspace and then sequentially dissected the lower  interspaces and placed bone graft in each interspace.  I then removed the  distraction screws.  There was good snug fit of bone graft at C3-4, 4-5, 5-  6, and 6-7.   We now turned our attention to the anterior spinal instrumentation.  I used  the high speed drill to remove some ventral spondylosis from the vertebral  end plates particularly at C4-5 and C5-6 so that the plate would lie down  flat.  I selected the appropriate length Codman Slim Lock anterior cervical  plate.  I laid it along the anterior aspect of the vertebral bodies from C3  down to C7.  I drilled two holes at C3, 4, 5, 6, and 7.  I then secured the  plates to the vertebral bodies by placing two 12 mm self-tapping screws at  C3, 4, 5, and 7.  I placed one C6 because I stripped the bone and the rescue  screw was not particularly strong either.  I then obtained an intraoperative  radiograph that demonstrated good position of the upper plate and screws.  The lower plate and screws could not be well seen because of the patient's  body habitus but it looked good in vivo.  I therefore secured the screws to  the plate by locking each cam.   We then obtained stringent hemostasis using bipolar electrocautery.  We  copiously irrigated the wound out with bacitracin solution, removed the solution and then removed the Caspar self-retaining retractor.  We carefully  inspected the  esophagus for any damage.  There was none apparent.  I then  placed a 10 mm flat Jackson-Pratt drain just above the anterior cervical  plate and tunneled it out through a separate stab wound.  We then  reapproximated the patient's platysma muscle with interrupted 3-0 Vicryl  sutures, subcutaneous tissues with interrupted 3-0 Vicryl suture and the  skin with benzoin and Steri-Strips.  The wound was then coated with  bacitracin ointment.  A dry sterile dressing was applied.  The drapes were  removed.  The patient was subsequently extubated by the anesthesia team and  transported to the post anesthesia care  unit in stable condition.  All  sponge, needle and instrument counts were correct at the end of this case.      JDJ/MEDQ  D:  08/11/2004  T:  08/12/2004  Job:  [id number]

## 2010-11-07 NOTE — Discharge Summary (Signed)
[place]  Patient:    Christy May, Christy May                   MRN: [id number] Adm. Date:  [id number] Disc. Date: [id number] Attending:  Eino Farber Dictator:   Dionne D. Su Hilt, N.P.                           Discharge Summary  ADMITTING DIAGNOSES:  1. Syncope.  2. Type 2 diabetes mellitus, uncontrolled, on insulin.  3. Urinary tract infection, resolved.  4. Congestive heart failure, mild.  5. Chronic obstructive pulmonary disease with asthma.  6. Allergic rhinitis and bronchitis.  7. Obesity, moderate to marked.  DISCHARGE DIAGNOSES:  1. Supraventricular tachycardia episode.  In bed, heart rate 160 beats per     minute.  2. Type 2 diabetes mellitus on insulin.  3. Hypertensive cardiovascular disease, controlled.  4. Chronic obstructive pulmonary disease with asthma, controlled.  5. Atrioseptal defect.  HISTORY OF PRESENT ILLNESS:  Patient is a 60 year old black female with known type 2 diabetes mellitus on insulin who gives a history of a near syncope with almost passing out last night having to sit to prevent falling with anterior chest pain and rapid heartbeat.  ALLERGIES:  PENICILLIN, SULFA, GI INTOLERANCE.  LABORATORIES AND STUDIES:  On October 13, 1999, EKG showed a sinus tachycardia, ST and T wave abnormality, inferolateral ischemia present, abnormal ECG.  On Oct 21, 1999, chest x-ray showed no active disease.  On Oct 21, 1999, patient had a Persantine Cardiolite SPECT images which showed, although subtle, ______ that the patient has findings consistent with inferior ischemia, normal left ventricular wall motion at rest, resting left ventricular ejection fraction of 51%.  On October 18, 1999, patient had a neck x-ray to rule out obstruction which showed a prominent calcification of the lower neck which may be within the thyroid or cricoid.  Calcification has been present on a prior cervical spine series from 1995.  On Oct 21, 1999, CBC was within normal limits except RBC at 3.81, hemoglobin 11.0, hematocrit 31.6.  PT was 13.2, iron normal at 1.0.  On October 18, 1999, ABG on room air shows pH of 7.42, PCO2 of 45.7, PO2 of 89.7, bicarbonate of 30.0.  On October 15, 1999, chemistry showed a sodium of 131, potassium 2.8, chloride 87, CO2 32, glucose 204, BUN 18, creatinine 1.8, calcium 9.5.  On October 20, 1999, sodium was 137, potassium 4.2, chloride 101, CO2 28, glucose 318, BUN 9, creatinine 1.0, calcium 9.4, magnesium 1.5.  Cardiac enzymes were within normal limits on April 23 and April 24.  On October 20, 1999, thyroid panel showed T4 of 11.7, TSH of 3.027, T3 of 173.8.  On October 13, 1999, digoxin level was 0.7.  Theophylline level on October 14, 1999, was 6.2.  On April 24, urinalysis was within normal limits except for a few bacteria.  The culture showed no growth in one day.  HOSPITAL COURSE:  Patient was admitted to the telemetry bed.  Chemistry, CBC, and cardiac enzymes were drawn.  Patient was started on a 1500, ADA diet.  EKG was obtained.  Patient was started on sliding scale Humalog to control her diabetes.  Patient was started on Reglan, Protonix, Altace, Lanoxin, Cardizem, lactulose.  Serum digoxin level was drawn before starting her on Lanoxin, ______  was started for daily use, Restoril for sleep was started on a p.r.n. basis.  Avandia 8 mg tablets q. 6:00 p.m. with dinner was added and K-Dur and Lasix were added to her plan of care.  A serum theophylline level was obtained which was subtherapeutic.  Patient was started on Theo-Dur 200 mg tablets q.a.m. and q.h.s., also Glucotrol XL was added to her plan of care to also manage her diabetes better.  Humulin 70/30 insulin 20 units q.a.m., a.c. breakfast was added if the CBG check is above 130.  Also, Glucophage was added to the plan of care.  A 2D echocardiogram was obtained which was read by Dr. Sharyn Lull.  Coumadin was initiated at 0.625 mg tablets p.o. q.d.,  also Flovent 110 was added which was two puffs q.d. and two puffs at night for frequent cough.  Lanoxin was increased to 0.25 mg p.o. q.d.  A 2D echocardiogram was done and read by Dr. Sharyn Lull.  Her ______ was increased to 50 units a.c. 5:00 p.m. meal.  Her K-Dur was decreased to 20 mEq t.i.d. with food.  Her Theo-Dur was discontinued.  She was scheduled for Persantine Cardiolite which was done by Dr. Sharyn Lull.  Patient was started on aspirin 325 mg p.o. q.d.  To assist with her breathing, Proventil 4 mg Repetabs were added q.a.m. and h.s.  Cardizem was increased to 240 mg.  DISCHARGE CONDITION:  Patient was discharged home in stable condition.  DISCHARGE MEDICATIONS:  1. Humulin 70/30 insulin 20 units q.a.m. a.c. breakfast.  2. Avandia 8 mg tablets with 6:00 p.m. dinner meal.  3. Cardizem CD 240 mg capsules one q.d.  4. Glucotrol XL 10 mg tablets one q.a.m. a.c. breakfast.  5. Glucophage 500 mg tablet one b.i.d. with breakfast and dinner.  6. Proventil 4 mg Repetabs one q.a.m. and q.h.s.  7. Flovent 110 metered dose inhaler two puffs q.a.m. and q.h.s.  8. Aspirin 325 mg one q.d.  9. Z-Pak 250 mg Dosepak as directed. 10. Lasix 40 mg tablets p.o. q.d. 11. K-Dur 20 mg tablets one t.i.d. 12. Lanoxin 0.25 mg tablets one q.d. 13. Protonix 40 mg one q.d. 14. Continue ______ XL 150 and other medications prior to admission.  DISCHARGE INSTRUCTIONS:  ACTIVITIES:  Ambulate as tolerated.  DIET:  ADA diet, 1500.  SPECIAL INSTRUCTIONS:  Continue his CBG checks a.c. breakfast and 6:00 p.m. dinner.  Discontinue Theo-Dur for now.  FOLLOW-UP:  Keep ______ 2000 at 4:00 p.m. with Dr. Petra Kuba and appointment at [facility] on Tuesday, Oct 28, 1999, at 9 oclock. DD:  11/04/99 TD:  11/04/99 Job: [id number]

## 2010-11-07 NOTE — Op Note (Signed)
NAME:  Christy May, Christy May                      ACCOUNT NO.:  [id number]   MEDICAL RECORD NO.:  [id number]                   PATIENT TYPE:  INP   LOCATION:  2899                                 FACILITY:  [facility]   PHYSICIAN:  Feliberto Gottron. Turner Daniels, M.D.                DATE OF BIRTH:  1950-07-01   DATE OF PROCEDURE:  02/11/2004  DATE OF DISCHARGE:                                 OPERATIVE REPORT   PREOPERATIVE DIAGNOSIS:  End-stage arthritis of right knee with varus  deformity.   POSTOPERATIVE DIAGNOSIS:  End-stage arthritis of right knee with varus  deformity.   PROCEDURE:  Right total knee arthroplasty, all components cemented,  Osteonics Scorpio, 7 femur, 7 tibia, 10 PS spacer, 5 modified medial  patellar button.   SURGEON:  Feliberto Gottron. Turner Daniels, M.D.   FIRST ASSISTANT:  Erskine Squibb B. Su Hilt, P.A.C.   ANESTHESIA:  General endotracheal anesthesia.   ESTIMATED BLOOD LOSS:  Minimal.   FLUIDS REPLACED:  1300 mL Crystalloid.   DRAINS PLACED:  Two medium Hemovacs and a Foley catheter.   INDICATIONS FOR PROCEDURE:  A 60 year old woman who underwent a left total  knee about three months ago who has done well and now desires right total  knee for end-stage arthritis with bone on bone arthritic changes and varus  deformity.  The risks and benefits of surgery are well understood by the  patient.   DESCRIPTION OF PROCEDURE:  The patient identified by arm band and taken to  the operating room at [facility]. [facility] where the appropriate  anesthetic monitors were attached and general endotracheal anesthesia was  induced with the patient in the supine position.  This was preceded by a  femoral nerve block anesthesia, tourniquet applied high to the right thigh  and right lower extremity prepped and draped in the usual sterile fashion  from the ankle to the tourniquet.  Limb wrapped with an Esmarch bandage.  Tourniquet inflated to 400 mmHg and an anterior midline incision centered  over the  patella was made about 20 cm in length through the skin and  subcutaneous tissue down to the level of the quadriceps and transverse  retinaculum over the patella which was incised and reflected medially.  Medial parapatellar arthrotomy was then accomplished.  The patella was  everted.  Prepatellar fat pad was resected.  The superficial medial  collateral ligament was elevated from distal to proximal off the tibia  leaving it intact distally going from anterior to posterior.  The patella  was everted.  The knee was hyperreflexed allowing [place] to remove the anterior  one half of the menisci, the cruciate ligaments and an osteotome was used to  remove the notch osteophytes.  A posteromedial Z retractor was placed.  A  Kel retractor through the notch and a lateral Holman retractor was placed.  We then entered the proximal tibia with the Osteonics step drill followed by  the intramedullary  rod and a 0 degree posterior slope cutting guide which  was pinned into place allowing resection of 3 to 4 mm of bone medially, 8 to  9 mm of bone laterally performing the proximal tibia resection and  protecting the posterior structures with the retractors.  We then entered  the distal femur 2 mm anterior to the PCL origin with a step drill followed  by the IM rod and a 5 degree right, 10 mm distal femoral cutting guide was  pinned into place.  Distal femoral cut accomplished.  Osteophytes removed  with the rongeurs.  Sized for a #7 femoral component with the sizing guide  and pinned the cutting block in place and 3 degrees of external rotation.  The anterior, posterior and Chamfer cuts were accomplished without  difficulty followed by the Scorpio box cut.  The everted patella was then  grasped with patellar cutting guide and the posterior 9 to 10 mm of the  patella resected, sized for #5 modified medial patellar button and drilled.  We then performed a trial reduction with a 7 right femoral component, a 7   tibial base plate with a 10 PS spacer and a 5 modified medial patellar  button.  All components fit nicely and the knee tracked without subluxing,  came to full extension and flexed to about 125 degrees limited by adipose  tissue.  We set the rotation to the tibial base plate over the second  metatarsal head with a small rod.  At this point, the trial components were  removed.  All bony surfaces were waterpicked clean and dried with suction  and sponges.  A double batch of Palicose polymethyl methacrylate cement with  1500 mg of Zinacef was mixed, applied to all bony and metallic mating  surfaces except for the posterior condyles of the femur itself.  The  retractors were once again placed, giving [place] our proximal tibia exposure and  a #7 tibial base pate was hammered into place.  Excess cement was removed.  A #7 right femoral component was hammered into place and excess cement was  removed.  A #5 modified medial patellar button was placed and excess cement  removed.  The cement was allowed to cure as the 10 mm PS spacer was snapped  into place and the wound was once again waterpicked clean.  Medium Hemovac  drains were placed deep in the wound.  The parapatellar arthrotomy was  closed with running #1Vicryl suture.  The subcutaneous tissue with 0 and 2-0  undyed Vicryl suture and the skin with skin staples.  A dressing of Xeroform  4x4 dressing sponges, Webril and Ace wrap applied.  The patient was then  awakened and taken to the recovery room without difficulty.                                               Feliberto Gottron. Turner Daniels, M.D.    Ovid Curd  D:  02/11/2004  T:  02/11/2004  Job:  [id number]

## 2010-11-07 NOTE — Op Note (Signed)
[place]  Patient:    Christy May, Christy May                   MRN: [id number] Proc. Date: 01/30/00 Adm. Date:  [id number] Disc. Date: [id number] Attending:  Sonda Primes CC:         Mardene Celeste. Lurene Shadow, M.D.   Operative Report  PREOPERATIVE DIAGNOSIS:  Sebaceous cyst measuring 5 cm in greatest diameter anterior chest wall, presternum.  POSTOPERATIVE DIAGNOSIS:  Sebaceous cyst measuring 5 cm in greatest diameter anterior chest wall, presternum.  OPERATION PERFORMED:  Excision of lesion anterior chest wall.  SURGEON:  Mardene Celeste. Lurene Shadow, M.D.  ASSISTANT:  Nurse.  ANESTHESIA:  General.  INDICATIONS FOR PROCEDURE:  The patient is a 60 year old woman with a large sebaceous cyst over the sternum just inferior to the sternal notch that has been draining with recurrent infections over the past several months.  She is brought to the operating room now after a course of antibiotics for excision of this lesion.  DESCRIPTION OF PROCEDURE:  Following the induction of satisfactory anesthesia, the patient was positioned supinely and the anterior chest prepped and draped to be included in a sterile operative field.  Because of her obesity, the lesion when she is recumbent is pulled up into the region of the sternal notch.  I outlined this area with an elliptical incision after having infiltrated the surrounding tissues with 1% Xylocaine with epinephrine.  An elliptical incision was made and carried down through the skin and subcutaneous tissues, dissection carried laterally to encompass the very large cystic cavity.  It was carried down to the sternum and the entire lesion removed and forwarded for pathological evaluation.  Hemostasis secured with electrocautery.  Sponge and instrument counts verified.  Wound closed in two layers with interrupted 3-0 Vicryl sutures in the deep layers and 4-0 Monocryl running subcuticular stitch in the skin.  This  was then reinforced with Steri-Strips and a sterile dressing applied.  Anesthetic reversed.  Patient removed from the operating room to the recovery room in stable condition having tolerated the procedure well. DD:  01/30/99 TD:  01/30/00 Job: [id number]

## 2010-11-07 NOTE — Procedures (Signed)
NAME:  TAYLR, MEUTH NO.:  [id number]   MEDICAL RECORD NO.:  [id number]          PATIENT TYPE:  OUT   LOCATION:  SLEEP CENTER                 FACILITY:  [facility]   PHYSICIAN:  Clinton D. Maple Hudson, MD, FCCP, FACPDATE OF BIRTH:  05/27/50   DATE OF STUDY:  07/12/2006                            NOCTURNAL POLYSOMNOGRAM   INDICATIONS FOR PROCEDURE:  Hypersomnia with sleep apnea. CPAP titration  is requested. A baseline diagnostic study on December 24, 2004 recorded an  AHI of 79.8 per hour. A CPAP titration study on February 16, 2005 reached  a pressure of 9 CWP with an AHI of zero per hour. Epward sleepiness  score 9 per hour. BMI 47.7. Weight 280 pounds.   HOME MEDICATIONS:  Are listed and reviewed.   SLEEP ARCHITECTURE:  Short total sleep time 211 minutes with sleep  efficiency 53%. Stage 1 was 6%; Stage 2, 93%; Stages 3, 4, and REM were  absent. Sleep latency 39 minutes. Awake after sleep onset 155 minutes.  Sleep onset was at 11:51 p.m. The technician recorded that the patient  wanted to sit up to eat dinner at 11:15 p.m. and was advised to eat a  snack, which she did.   RESPIRATORY DATA:  CPAP titration protocol:  CPAP was titrated to 21  CWP. Full control was never achieved and therapeutic improvement in AHI  was suboptimal. At a final pressure of 21 CWP, recorded AHI was 77.9 per  hour, reflecting residual obstructive central and mixed apnea's and  hypopnea's. The patient was restless and unable to maintain sleep by the  end of the study at 5:30 a.m.   OXYGEN DATA:  Moderate to loud snoring with oxygen desaturation to a  nadir of 81%. Main oxygen saturations through the study was 94% on room  air.   CARDIAC DATA:  Normal sinus rhythm. Movement/parasomnia: Insignificant  limb movement with no unusual movement or behavior recorded. Bathroom  x2.   IMPRESSION:  1. Unsuccessful CPAP titration to 21 CWP with therapeutic range      control best at the beginning of the  titration (5 CWP, AHI 10.5 per      hour.) Higher pressures may have induced nasal congestion. Consider      evaluation of the nasopharynx for fixed or reversible obstruction.      Some patient's will experience theta motor nasal congestion from      high CPAP air flows and will do better at lower pressures. A small      Resmet Quattro mask was used with a heated humidifier for this      study.  2. Baseline diagnostic NP SG on December 24, 2004 had recorded an AHI of      79.8 per hour and CPAP titration on February 16, 2005 of a CPAP      pressure of 16 CWP, given a AHI of zero per hour. On the present      study, A CPAP pressure of 16 CWP was associated with an AHI of 34.7      per hour. There was moderate to loud snoring      through the CPAP on the current study  with oxygen desaturation to a      nadir of 81% but a mean saturation through the night of 94% on room      air.      Clinton D. Maple Hudson, MD, [employer], FACP  Diplomate, Biomedical engineer of Sleep Medicine  Electronically Signed     CDY/MEDQ  D:  07/18/2006 09:05:08  T:  07/18/2006 16:49:34  Job:  [id number]

## 2010-11-07 NOTE — Discharge Summary (Signed)
NAMENESHA, COUNIHAN NO.:  [id number]   MEDICAL RECORD NO.:  [id number]          PATIENT TYPE:  INP   LOCATION:  5039                         FACILITY:  [facility]   PHYSICIAN:  Feliberto Gottron. Turner Daniels, M.D.   DATE OF BIRTH:  21-Feb-1951   DATE OF ADMISSION:  02/11/2004  DATE OF DISCHARGE:  02/14/2004                                 DISCHARGE SUMMARY   PRIMARY DIAGNOSIS:  End-stage degenerative joint disease of the right knee.   PROCEDURE WHILE IN HOSPITAL:  Right total knee arthroplasty.   HISTORY OF PRESENT ILLNESS:  The patient is a 60 year old woman who  underwent a left total knee arthroplasty 3 months prior to this admission.  She has done well postoperatively and now desires a right total knee for end-  stage arthritis that has been followed for several months with increasing  pain, having failed conservative management.  X-rays show bone-on-bone  arthritic changes and varus deformity.  The patient understands the risks  and benefits and wishes to proceed with total knee arthroplasty.   ALLERGIES:  Allergies to ACE INHIBITOR and PENICILLIN.   MEDICATIONS AT TIME OF ADMISSION:  Medications at time of admission include  ranitidine, Avalide, Effexor, Tylenol No.3, Actos, Metformin, Vicodin,  potassium chloride, iron, Lasix, Advair, gabapentin, promethazine with  codeine, Humulin, Lantus and diltiazem.   MEDICAL HISTORY:  Usual childhood diseases.   Adult history of:  1.  Hypertension.  2.  Diabetes.  3.  Obesity.  4.  Asthma.  5.  Bronchitis.  6.  GERD.   SURGICAL HISTORY:  1.  Gallbladder removed.  2.  Hysterectomy.  3.  Tubal ligation.  4.  D&C.  5.  Total knee arthroplasty.   No difficulties with GET.   SOCIAL HISTORY:  No tobacco, no ethanol, no IV drug abuse.  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   FAMILY HISTORY:  The __________ is alive at 71 with a history of  hypertension and DJD, as well as anemia and alcohol abuse.  __________ died  at  42 with diagnosis of hypertension.   REVIEW OF SYSTEMS:  Positive glasses, difficulty swallowing, a.m. cough,  chest pain, toothaches and ulcers.  Denies any recent change in medical  condition.   PHYSICAL EXAMINATION:  VITAL SIGNS:  Temperature 98, pulse 86, respirations  18, blood pressure 160/90.  GENERAL:  She is 5 foot 4 inches and obese.  HEENT:  Head is normocephalic, atraumatic.  TMs are clear.  Eyes:  Pupils  are equal, round and reactive to light and accommodation.  Nose is patent.  Throat benign.  She is missing several molars in her dentition.  NECK:  Neck is supple with full range of motion.  CHEST:  Chest clear to auscultation and percussion.  HEART:  Regular rate and rhythm.  ABDOMEN:  Abdomen soft, nontender; body habitus makes it difficult to  determine masses.  EXTREMITIES:  Left knee range of motion 0-100 degrees.  Well-healed normal  scar.  Right knee 0-100 degrees with positive pain and crepitus, trace  effusion.   LABORATORY AND ACCESSORY CLINICAL DATA:  X-rays show  tricompartmental  changes of the right knee.   Preoperative labs including CBC, CMET, chest x-ray, EKG, PT and PTT were all  within normal limits with the exception of hemoglobin of 10.8, hematocrit of  33.0.   HOSPITAL COURSE:  On the date of admission, the patient was taken to the  operating room at [facility] where she underwent a right total knee  arthroplasty using Osteonics Scorpio components, #7 femur, #7 tibia, 10 PS  spacer and #5 modified medial patellar button, all components cemented.  Hemovac drain was placed double-armed into the knee and a Foley catheter was  inserted prior to surgery.  Physical therapy was begun postoperatively in  the recovery room with CPM.  The patient was placed on postoperative  Coumadin prophylaxis with a target INR of 2-3.  She was placed on  perioperative antibiotics and postoperative PCA for pain control.  Postoperative day 1, the patient was  complaining of pain, she was afebrile,  hemoglobin of 9.3, INR 0.9.  Dressing was dry.  Drain was discontinued.  She  was neurovascularly intact but weak to all motor efforts, otherwise, stable.  Physical therapy was continued and Foley was discontinued.  PT evaluated the  patient and felt that she was too lethargic to safely transfer, and they  recommended rehab after they saw the patient later that day.  Postoperative  day 2, the patient was sleeping, but arousable.  Vital signs were stable,  she was afebrile.  Hemoglobin 8.4.  Her dressing was dry and her drains were  out.  She was moving her feet with greater strength.  PCA was discontinued  and rehab consult was obtained.  Postoperative day 3, the patient was  complaining of moderate pain, T-max was 99.7, INR of 1.1, dressing was dry,  calf was soft and she was otherwise orthopedically stable.  She was  continuing physical therapy, but a bed became available on rehab and she was  discharged to their care.  She will continue with total knee precautions,  will also continue with oral pain medications with care not to over-  medicate.   FOLLOWUP:  She will return to see [place] after she is discharged from rehab. We  will continue to follow her as needed during her rehab stay.       JBR/MEDQ  D:  03/19/2004  T:  03/20/2004  Job:  [id number]

## 2010-11-07 NOTE — Discharge Summary (Signed)
NAME:  Christy May, Christy May                      ACCOUNT NO.:  [id number]   MEDICAL RECORD NO.:  [id number]                   PATIENT TYPE:  INP   LOCATION:  5037                                 FACILITY:  [facility]   PHYSICIAN:  Feliberto Gottron. Turner Daniels, M.D.                DATE OF BIRTH:  August 02, 1950   DATE OF ADMISSION:  10/24/2003  DATE OF DISCHARGE:  10/29/2003                                 DISCHARGE SUMMARY   PRIMARY DIAGNOSIS:  Left knee medial meniscal tear versus end-stage  degenerative joint disease.   PROCEDURE WHILE IN HOSPITAL:  Left knee scope followed by conversion to a  left total hip arthroplasty.   HISTORY OF PRESENT ILLNESS:  The patient is a 60 year old morbidly obese  woman weighing a little over 300 pounds with significant arthritis of both  knees, followed by Dr. Feliberto Gottron. Rowan's partner, Otho Darner, M.D.  The patient has failed conservative treatment.  MRI scan showed a probable  bucket-handle tear of the medial meniscus and the patient has near bone-on-  bone arthritic changes by x-ray.  The patient has failed conservative  treatment, anti-inflammatory medications, physical therapy, cortisone  injection as well as pain medication.  She now desires elective arthroscopic  evaluation and treatment of her right knee followed by a probable but not  certain total knee arthroplasty if there are any bone-on-bone arthritic  changes.  A plain radiograph showed 1-2 mm of bone remaining.  She was  originally scheduled for this to be done in January of 2005 but her multiple  medical issues, particularly cardiology as well as other internal medicine  problems, were attended to and now she has been cleared by them to proceed  with the surgery.   ALLERGIES:  ACE INHIBITORS and PENICILLIN.   MEDICATIONS AT TIME OF ADMISSION:  Ranitidine, Vicodin, Metformin, Humulin,  Effexor, Actos, Avalide, Zantac, Nitrostat, Biaxin XL and diltiazem.   PAST MEDICAL HISTORY:  1. Usual  childhood diseases.  2. Adult history of:     a. Hypertension.     b. Insulin-dependent diabetes.     c. Obesity.     d. Asthma.     e. Bronchitis.     f. GERD.   SURGICAL HISTORY:  1. Gallbladder surgery.  2. Hysterectomy.  3. Tubal ligation.  4. D&C.  5. Tonsillectomy.   No difficulties with GET.   SOCIAL HISTORY:  No tobacco, no alcohol, no IV drug abuse.  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   FAMILY HISTORY:  Father is alive at 25 with problems with hypertension, DDD,  anemia, ETOH.  Mother died at 91 of hypertension and pneumonia.   REVIEW OF SYSTEMS:  Positive glasses, difficulty swallowing and cough, chest  pain, toothache, ulcers; she is currently on Biaxin for bronchitis.   EXAM:  VITAL SIGNS:  Patient's temperature is 98.0, pulse 80, respirations  20, blood pressure 132/78.  She is 5  foot 4, over 300 pounds.  HEENT:  Examination of her mouth does show some missing molar dentition,  otherwise, normal.  NECK:  Neck is supple with full range of motion.  CHEST:  Increased breath sounds and in all the bronchial tree.  HEART:  Regular rate and rhythm.  ABDOMEN:  Abdomen soft and nontender.  EXTREMITIES:  Left knee range of motion 0-110 degrees with positive  McMurray's.  Exam revealed no effusion, stable ligamentous, positive  crepitus.   LABORATORY AND ACCESSORY CLINICAL DATA:  X-rays as in HPI including MRI  which showed a bucket-handle tear and tricompartmental DJD.   Preoperative labs including CBC, CMET, chest x-ray, EKG, PT and PTT are all  within normal limits with the exception of sodium of 134, glucose 197.   HOSPITAL COURSE:  On the day of admission, the patient was taken to [facility]  Operating Room where she underwent a right knee arthroscopic evaluation  which did show bone-on-bone arthritis, so she was converted to a total knee  arthroplasty using Osteonics Scorpio components, #7 femur, #7 tibia, #5  modified medial patellar button and a 10 PS  spacer.  A medium Hemovac drain  was placed into the wound.  The patient was placed on preoperative  antibiotics.  She was placed on postoperative Coumadin prophylaxis with  target INR of 2-3 by pharmacy protocol.  She was begun on physical therapy  the first postoperative day and was given a Dilaudid PCA for pain control.  Postop day 1, the patient was complaining of pain, she was afebrile and  somewhat drowsy.  Hemoglobin 9.6, INR 1.1.  Hemovac was discontinued  secondary to becoming quiescent and she was otherwise neurovascularly intact  to light touch.  Motors were weak in all directions secondary to pain and  drowsiness and poor response.  Physical therapy was begun and incentive  spirometry was encouraged and pain medications were decreased.  Postoperative day 2, the patient was drowsy, but more arousable, no  complaints of medical problems with the exception of pain in her left knee,  hemoglobin 9.8, INR of 1.0, she was afebrile, wound was benign, calf was  soft and neurovascularly intact.  PCA and Foley were discontinued and rehab  consult was obtained.  Postoperative day 3, the patient was doing well,  dressing was changed, wound looked great without signs of infection.  She  was otherwise stable except for medically did show that she had elevated  glucose of 300+ on several occasions and because of this, a diabetic consult  was obtained.  On postoperative day 4, the patient continued to make steady  progress with blood sugars in the 236 range and she was awaiting rehab  admission.  Postoperative day 5, the patient was awake and alert.  She had  range of motion of 0-70 degrees, wound was benign.  She was okayed for  transfer to rehab.  She will continue with Percocet and physical therapy in  rehab as per the rehab doctors and hopefully will have the medical teaching  that she needs to keep her diabetes under better control.  FOLLOWUP:  She will return to see [place] within 2 weeks of  her discharge from  rehab and we will continue to follow her during her rehab stay as necessary.      Laural Benes. Jannet Mantis.                     Feliberto Gottron. Turner Daniels, M.D.    Merita Norton  D:  12/03/2003  T:  12/03/2003  Job:  [id number]

## 2010-11-07 NOTE — Procedures (Signed)
NAME:  Christy May, Christy May NO.:  [id number]   MEDICAL RECORD NO.:  [id number]          PATIENT TYPE:  OUT   LOCATION:  SLEEP CENTER                 FACILITY:  [facility]   PHYSICIAN:  Clinton D. Maple Hudson, M.D. DATE OF BIRTH:  [birth year]   DATE OF STUDY:  02/16/2005                              NOCTURNAL POLYSOMNOGRAM   REFERRING PHYSICIAN:  Dr. Ocie Cornfield   DATE OF STUDY:  February 16, 2005.   INDICATION FOR STUDY:  Hypersomnia with sleep apnea. Epworth Sleepiness  Score 9/24, BMI 47, weight 280 pounds. A baseline diagnostic NPSG December 24, 2004 recorded an RDI 79.8 per hour. CPAP titration is requested.   SLEEP ARCHITECTURE:  Total sleep time 288 minutes with sleep efficiency 75%.  Stage I 7%, stage II 80%, stages III and IV 7%, REM 6% of total sleep time.  Sleep latency 48 minutes, REM latency 277 minutes, awake after sleep onset  48 minutes, arousal index 8.1. No bedtime medication taken.   RESPIRATORY DATA:  CPAP titration protocol. CPAP was titrated to 24 CWP  because of residual snoring. Suggest initial home trial at 16 CWP, API 0 per  hour, anticipating better toleration of the lower pressure. Some residual  snoring will need to be accepted. A small Respironics ComfortGel Nasal Mask  was used with a heated humidifier.   OXYGEN DATA:  Very loud snoring before CPAP control with desaturation to  82%. After CPAP control saturation held 93-96% on room air.   CARDIAC DATA:  Normal sinus rhythm.   MOVEMENT/PARASOMNIA:  Occasional leg jerk with little effect on sleep.   IMPRESSION/RECOMMENDATIONS:  1.  Continuous positive airway pressure titration to a suggested initial      home pressure of 16 CWP, apnea-hypopnea index 0 per hour, using a small      Respironics ComfortGel Nasal Mask with heated humidifier.  2.  Baseline diagnostic study December 24, 2004 had recorded an Respiratory      Disturbance Index of 79.8.      Clinton D. Maple Hudson, M.D.  Diplomate, Biomedical engineer  of Sleep Medicine  Electronically Signed    CDY/MEDQ  D:  02/22/2005 14:11:55  T:  02/22/2005 63:87:56  Job:  [id number]

## 2010-11-07 NOTE — Consult Note (Signed)
NAME:  MAKYNLIE, ROSSINI NO.:  [id number]   MEDICAL RECORD NO.:  [id number]          PATIENT TYPE:  INP   LOCATION:                               FACILITY:  [facility]   PHYSICIAN:  Lyn Records III, M.D.DATE OF BIRTH:  Jan 19, 1951   DATE OF CONSULTATION:  DATE OF DISCHARGE:                                   CONSULTATION   REASON FOR CONSULTATION:  1.  Chest pain, uncertain etiology.  2.  Hypertension.  3.  Diabetes mellitus.  4.  Asthma.  5.  Status post cervical disc surgery August 11, 2004.   RECOMMENDATIONS:  I doubt that the chest pain is cardiac or with risk  factors. Need to rule out myocardial infarction with enzymes and serial  EKGs.   HISTORY OF PRESENT ILLNESS:  The patient is 60 years of age and has multiple  risk factors including diabetes, hypertension, hypercholesterolemia. She  also has asthma. She underwent C3 through C7 cervical disc operation today  by Dr. Cristi Loron. Postoperatively, she developed substernal  discomfort that she feels was severe but was very poorly characterized. She  now feels much better. She denied dyspnea or radiation. She is having  obvious neck discomfort from the surgical procedure.   MEDICATIONS:  Her medications on admission include Zantac, Neurontin,  Glucophage, Actos, KCl, Effexor, Lopressor, Lasix, Advair, Cardizem,  Percocet, Lantus.   ALLERGIES:  ACE INHIBITORS and PENICILLIN.   PHYSICAL EXAMINATION:  GENERAL:  The patient is in no acute respiratory  distress. She is uncomfortable due to neck pain from her surgery.  VITAL SIGNS:  Blood pressure 170/70, heart rate 100.  SKIN:  Warm and dry.  CHEST:  Clear.  CARDIOVASCULAR:  No gallop. No murmur.  EXTREMITIES:  No edema.   LABORATORY DATA:  EKG normal sinus rhythm, no acute changes.   DISPOSITION:  The patient appears comfortable at this time. She is 53 and  has diabetes, hypertension, and hyperlipidemia. She does not smoke. The  chest discomfort  is probably noncardiac but I have no way to be able to  totally exclude that. Therefore, I believe with need to cycle cardiac  enzymes and EKGs to be certain.      HWS/MEDQ  D:  08/12/2004  T:  08/12/2004  Job:  [id number]   cc:   Cristi Loron, M.D.  [address]  [place] [address]  Fax: [phone]   Rutha Bouchard, M.D.

## 2010-11-07 NOTE — Procedures (Signed)
NAME:  Christy May, Christy May NO.:  [id number]   MEDICAL RECORD NO.:  [id number]          PATIENT TYPE:  OUT   LOCATION:  SLEEP CENTER                 [facility]ACILITY:  [facility]   PHYSICIAN:  Clinton D. Maple Hudson, M.D. DATE OF BIRTH:  [birth year]   DATE OF STUDY:  12/24/2004                              NOCTURNAL POLYSOMNOGRAM   REFERRING PHYSICIAN:  Dr. Ocie Cornfield   DATE OF STUDY:  December 24, 2004   INDICATION FOR STUDY:  Hypersomnia with sleep apnea.  Epworth Sleepiness  Score 15/24, BMI 47, weight 280 pounds.   SLEEP ARCHITECTURE:  Total sleep time 255 minutes with sleep efficiency 65%.  Stage I was 3%, stage II 90%, stages III and IV 1%, REM 4% of total sleep  time.  Sleep latency 68 minutes, REM latency 311 minutes, awake after sleep  onset 67 minutes, arousal index increased at 47.  No bedtime medication was  reported.   RESPIRATORY DATA:  Respiratory disturbance index (RDI, AHI) 79.8 obstructive  events per hour indicating severe obstructive sleep apnea/hypopnea syndrome.  This included 10 central apneas, 71 obstructive apneas, 2 mixed apneas and  256 hypopneas.  Events were not positional.  REM RDI 84.  Events and sleep  began late, after 1 a.m., not allowing enough time by protocol for CPAP  titration as a split study on this night.   OXYGEN DATA:  Very loud snoring with oxygen desaturation to 80%.  Mean  oxygen saturation through the study was 94% on room air.   CARDIAC DATA:  Normal sinus rhythm.   MOVEMENT/PARASOMNIA:  Occasional leg jerk.  Bathroom x2.   IMPRESSION/RECOMMENDATION:  1.  Severe obstructive sleep apnea/hypopnea syndrome, respiratory      disturbance index 79.8 per hour, with loud snoring and oxygen      desaturation to 80%.  2.  Consider return for continuous positive airway pressure titration or      evaluate for alternative therapies as appropriate.     Clinton D. Maple Hudson, M.D.  Diplomat   CDY/MEDQ  D:  01/04/2005 12:11:52  T:  01/04/2005  20:39:16  Job:  [id number]

## 2010-11-07 NOTE — Discharge Summary (Signed)
NAMEJAMERIA, Christy May NO.:  [id number]   MEDICAL RECORD NO.:  [id number]          PATIENT TYPE:  INP   LOCATION:  3025                         FACILITY:  [facility]   PHYSICIAN:  Clydene Fake, M.D.  DATE OF BIRTH:  05-Apr-1951   DATE OF ADMISSION:  08/11/2004  DATE OF DISCHARGE:  08/16/2004                                 DISCHARGE SUMMARY   DIAGNOSES:  Herniated nucleus pulposus spondylosis C3-4, 4-5, 5-6, and 6-7.   DISCHARGE DIAGNOSES:  Herniated nucleus pulposus spondylosis C3-4, 4-5, 5-6,  and 6-7.   PROCEDURE:  C3-4, 4-5, 5-6, 6-7 ACDF.   REASON FOR ADMISSION:  Patient is a 60 year old woman with neck and  bilateral arm and hand pain.  MRI showed severe stenosis at 4-5 and 5-6 with  moderate stenosis 3-4 and 6-7.  Patient __________ fusion at all four  levels.   HOSPITAL COURSE:  Patient was admitted day of surgery.  Underwent procedure  above without complications.  Postoperative transferred to recovery room and  then to the floor.  Patient __________ chest pain and cardiology consult was  gotten on August 11, 2004 in the evening.  __________ fairly well and she  was ruled out for MI by laboratories and EKG by cardiology.  The symptoms  resolved and by the 22nd she was having no further chest pain.  Neurosurgically, she was doing well and making progress.  She was up  ambulating and doing well.  She underwent Cardiolite test the 23rd or 24th  which showed normal perfusion on the stress, no chest pain with exertion.  I  increased her Toprol to 100 mg and they were planning on an outpatient  echocardiogram.  She was doing well neurosurgically, eating, moving all  extremities well, up ambulating.  Incision was clean, dry, and intact and  she was discharged home in stable condition on August 16, 2004.  Follow-up  will be with cardiology and Dr. Lovell Sheehan.      JRH/MEDQ  D:  09/04/2004  T:  09/04/2004  Job:  [id number]

## 2010-11-07 NOTE — Assessment & Plan Note (Signed)
[facility]                           GASTROENTEROLOGY OFFICE NOTE   Christy May, Christy May                   MRN:          [id number]  DATE:01/25/2006                            DOB:          12/26/1950    Christy May is a 60 year old African American female, a patient of Dr.  Petra Kuba, who he evaluated previously for dysphagia to solids and liquids  as well as choking spells and chest pain.  She has gastroesophageal reflux,  but no evidence of stricture.  Last endoscopy in October 2006 showed  essentially normal exam of the esophagus.  There was no hiatal hernia.  She  has been on Nexium 40 mg twice a day.  She still complains of choking,  especially with liquids,  and chest pressure.  Another problem has been  lower abdominal pain, mostly in the right lower quadrant.  She has never had  a colonoscopy and there is no family history of colon cancer.   PHYSICAL EXAMINATION:  Blood pressure 140/98, pulse 68 and weight 290  pounds, which represents continued weight gain of 4 pounds since last  appointment.  Lungs are clear to auscultation with decreased breath sounds  bilaterally.  Chest and rib cage were not tender.  Abdomen was obese, large,  tender in the epigastrium.  Post-cholecystectomy scar in right upper  quadrant.  Right lower quadrant was quite tender, but no palpable mass.  Exam was limited because of the large size of her abdomen.  Rectal exam  shows normal rectal tone with Hemoccult-negative stool.   IMPRESSION:  1.  [age]-year-old African American female with gastroesophageal reflux,      choking spells and suspected esophageal dysmotility.  Upper endoscopy      recently did not show any stricture.  2.  Right lower quadrant abdominal discomfort.  The patient has never had a      colonoscopy.  There are no risk factors for colon cancer.  Her stool is      Hemoccult-negative.  3.  Status post remote cholecystectomy with  borderline dilated common bile      duct.   PLAN:  1.  Add Reglan 5 mg before meals to improve her esophageal motility.  2.  Colonoscopy scheduled.  3.  Continue Nexium 40 mg p.o. b.i.d.  4.  Barium swallow to assess the esophageal motility.                                   Hedwig Morton. Juanda Chance, MD   DMB/MedQ  DD:  01/25/2006  DT:  01/26/2006  Job #:  [id number]   cc:   Mina Marble, MD

## 2010-11-07 NOTE — Discharge Summary (Signed)
NAME:  Christy May, Christy May                      ACCOUNT NO.:  [id number]   MEDICAL RECORD NO.:  [id number]                   PATIENT TYPE:  IPS   LOCATION:  4140                                 FACILITY:  [facility]   PHYSICIAN:  Ellwood Dense, M.D.                DATE OF BIRTH:  Nov 12, 1950   DATE OF ADMISSION:  10/29/2003  DATE OF DISCHARGE:  11/08/2003                                 DISCHARGE SUMMARY   DISCHARGE DIAGNOSES:  1. Left total knee arthroplasty secondary to osteoarthritis.  2. History of anemia.  3. History of chronic obstructive pulmonary disease and asthma.  4. History of hypertension.  5. Urinary incontinence.  6. Status post Escherichia coli urinary tract infection.  7. Methicillin resistant Staphylococcus aureus urinary tract infection     colonized.  8. History of asthma.  9. Obesity.  10.      Deep vein thrombosis prophylaxis.   HISTORY OF PRESENT ILLNESS:  The patient is a 60 year old black female  admitted on Oct 24, 2003 with end-stage osteoarthritis of left knee.  Underwent a left total knee arthroplasty on Oct 24, 2003 by Dr. Turner Daniels.  Coumadin for DVT  prophylaxis.  No significant postoperative complications  except sedation.  Physical therapy report indicated the patient was transfer  minimal assist with rolling walker, ambulating minimal assist, bed mobility  minimal assist.  The patient was transferred to rehab department on Oct 29, 2003.   PAST MEDICAL HISTORY:  As above plus diabetes, hypertension, obesity, COPD,  depression, asthma, urinary incontinence.   PAST SURGICAL HISTORY:  1. Cholecystectomy.  2. Partial hysterectomy.  3. Tubal ligation.   MEDICATIONS ON ADMISSION:  1. Lantus.  2. Aspirin 81 mg daily.  3. Actos 15 mg daily.  4. __________  HR daily.  5. Effexor XR.  6. Zantac p.o. b.i.d.  7. __________  5 mg twice daily.  8. Insulin 70/30 units in the a.m. and Lantus.   ALLERGIES:  PENICILLIN, ACE INHIBITORS.   PRIMARY CARE  PHYSICIAN:  Dr. Petra Kuba.   SOCIAL HISTORY:  The [place] elderly [place]  Prior to admission the patient worked for [employer].  She is widowed.  She is also an Higher education careers adviser.  She lives in a home  with one step to entry.   FAMILY HISTORY:  Noncontributory.   HOSPITAL COURSE:  Christy May was admitted to [facility] rehab  department on Oct 29, 2003.  The patient received one and three hours of  therapy daily.  Initially while she was on rehab the first few days, she got  off to a very slow start.  Initially it was thought that she might need a  long stay in rehab.  Fortunately, once the pain was under control, the  patient progressed very well in rehab.  She was started OxyContin 10 mg and  this eventually was tapered up to 20  mg q.12h. and received oxycodone IR as  needed.  Her incision healed fairly well.  There were no signs of infection.  Staples were discontinued on Nov 04, 2002.  Steri-Strips were applied.  She  was discharged in overall modified independent level, able to flex her knee  from 0 to 80 degrees.   Hospital course was significant for E coli urinary tract infection, MRSA UTI  with colonized, significantly elevated CBGs and anemia and incontinence.  The patient's insulin was adjusted as needed throughout her stay in rehab.  She was discharged on 39 units of 70/30 and 25 units of Lantus.  CBGs were  taken at least twice daily.  The patient also had dietary consults about  diet modification concerning CBGs.  The patient is to follow up with Dr.  Petra Kuba regarding CBGs.  The patient also was placed on antibiotic, Cipro  250 mg b.i.d., for urinary tract infection.  Urine culture was performed on  Oct 29, 2003 due to E coli greater than 100,000 colonies.  The patient  completed a seven-day course of his antibiotics.  Repeat urine culture was  performed on Nov 04, 2003 due to increase in urinary frequency.   MRSA was  found, 50,000 colonies.  Infectious disease was consulted regarding this  because the patient was scheduled to be discharged to see if another type of  antibiotic was necessary.  Infectious disease physician confirmed that MRSA  was colonized and no antibiotic was necessary.  The patient remained on  Trinsicon one tablet b.i.d. for anemia.  Latest hemoglobin was performed on  Nov 02, 2003 which was 8.5, hematocrit 25.6.  The patient adamantly refuses  any type of transfusion.  The patient continued to take Advair for history  of COPD.  She also continued to take hydrochlorothiazide and Diovan and  __________  for history of hypertension.  No adjustment necessary in these  medications.   The patient had a history of urinary incontinence and was receiving Enablex  7.5 mg p.o. daily. This was increased to 15 mg daily.  The patient is to  follow up with Dr. Brunilda Payor, her urologist.   Latest INR done on Nov 07, 2003 was 3.6.  Latest hemoglobin 8.5, hematocrit  25.6.  Latest white blood cell count 8.4, platelet count 336.  Latest sodium  137, potassium 4.1, chloride 97, CO2 30, glucose 214, BUN 19, creatinine  1.1, AST 60, ALT 23.   Physical therapy report at the time of discharge stated the patient was able  to ambulate approximately greater than 200 feet with rolling walker,  modified assist level.  Able to flex her knees 3 to 80 degrees.  Able to  perform most ADLs at modified assist level.  She is discharged at modified  assist level to home with her family.  At the time of discharge the surgical  incision had about 1+ edema.  It showed no signs of infection .  Steri-  Strips were then placed.   DISCHARGE MEDICATIONS:  1. Robaxin 500 mg one tablet q.6-8h. as needed for spasm.  2. Trinsicon one tablet twice daily.  3. Coumadin 5 mg 1-1/2 tablet daily in the p.m. until June 4.  4. Cardizem 180 mg daily.  5. Resume Avalide.  6. Effexor 100 mg daily.  7. Glucophage one tablet  twice daily. 8. Actos 30 mg daily.  9. Lantus 25 units at night.  10.      Oxycodone 5 mg one to two tablets q.4-6h. as needed.  11.      OxyContin one tablet q.12h.  Follow taper.  12.      Resume Enablex at 15 mg daily.  13.      Insulin 70/30 39 units in the a.m.  14.      No aspirin, no ibuprofen, no Aleve while no Coumadin.  15.      Pain management with oxycodone, OxyContin and Tylenol.   ACTIVITY:  No driving.  Use walker.  No drinking alcohol, no smoking.   DIET:  No concentrated sweets.  Check CBGs at least twice daily and record  results.   FOLLOW UP:  [facility] for PT, OT and R.N. to draw PT/INR,  Coumadin level on Monday, Nov 12, 2003.  Follow up with Dr. Petra Kuba for  four-to-six-week check and check CBGs, hemoglobin and hematocrit.  Follow up  with Dr. Turner Daniels in two weeks.  Follow up with Dr. Ellwood Dense as needed.  Follow up with Dr. Brunilda Payor of urology in two to four weeks.      Drucilla Schmidt, P.A.                         Ellwood Dense, M.D.    LB/MEDQ  D:  11/08/2003  T:  11/10/2003  Job:  [id number]   cc:   Feliberto Gottron. Turner Daniels, M.D.  [address]  [place]  [place] [address]  Fax: [phone]   Eino Farber., M.D.  [address] [place]  [place] [address]  Fax: [phone]   Ellwood Dense, M.D.  [address] [place]  [place] [address]  Fax: [phone]

## 2010-11-20 ENCOUNTER — Other Ambulatory Visit: Payer: Self-pay | Admitting: Internal Medicine

## 2010-11-21 ENCOUNTER — Encounter: Payer: Self-pay | Admitting: Internal Medicine

## 2010-11-21 ENCOUNTER — Other Ambulatory Visit: Payer: Self-pay

## 2010-11-21 ENCOUNTER — Encounter: Payer: Self-pay | Admitting: Endocrinology

## 2010-11-21 ENCOUNTER — Ambulatory Visit (INDEPENDENT_AMBULATORY_CARE_PROVIDER_SITE_OTHER): Payer: PRIVATE HEALTH INSURANCE | Admitting: Internal Medicine

## 2010-11-21 ENCOUNTER — Other Ambulatory Visit (INDEPENDENT_AMBULATORY_CARE_PROVIDER_SITE_OTHER): Payer: PRIVATE HEALTH INSURANCE

## 2010-11-21 ENCOUNTER — Ambulatory Visit (INDEPENDENT_AMBULATORY_CARE_PROVIDER_SITE_OTHER): Payer: PRIVATE HEALTH INSURANCE | Admitting: Endocrinology

## 2010-11-21 VITALS — BP 132/80 | HR 75 | Temp 97.8°F | Ht 64.0 in | Wt 293.0 lb

## 2010-11-21 VITALS — BP 132/80 | HR 75 | Temp 97.8°F | Ht 63.0 in | Wt 293.0 lb

## 2010-11-21 DIAGNOSIS — Z0000 Encounter for general adult medical examination without abnormal findings: Secondary | ICD-10-CM

## 2010-11-21 DIAGNOSIS — J209 Acute bronchitis, unspecified: Secondary | ICD-10-CM | POA: Insufficient documentation

## 2010-11-21 DIAGNOSIS — E119 Type 2 diabetes mellitus without complications: Secondary | ICD-10-CM

## 2010-11-21 DIAGNOSIS — R7309 Other abnormal glucose: Secondary | ICD-10-CM

## 2010-11-21 LAB — URINALYSIS, ROUTINE W REFLEX MICROSCOPIC
Hgb urine dipstick: NEGATIVE
Nitrite: NEGATIVE
Specific Gravity, Urine: 1.025 (ref 1.000–1.030)
Total Protein, Urine: NEGATIVE
Urine Glucose: >=1000
Urobilinogen, UA: 0.2 (ref 0.0–1.0)

## 2010-11-21 LAB — HEPATIC FUNCTION PANEL
ALT: 23 U/L (ref 0–35)
Albumin: 3.5 g/dL (ref 3.5–5.2)
Total Protein: 7 g/dL (ref 6.0–8.3)

## 2010-11-21 LAB — BASIC METABOLIC PANEL
BUN: 22 mg/dL (ref 6–23)
CO2: 28 mEq/L (ref 19–32)
Calcium: 9.7 mg/dL (ref 8.4–10.5)
Chloride: 103 mEq/L (ref 96–112)
Creatinine, Ser: 0.9 mg/dL (ref 0.4–1.2)
Glucose, Bld: 306 mg/dL — ABNORMAL HIGH (ref 70–99)

## 2010-11-21 LAB — CBC WITH DIFFERENTIAL/PLATELET
Basophils Relative: 0.3 % (ref 0.0–3.0)
Eosinophils Absolute: 0.1 10*3/uL (ref 0.0–0.7)
Hemoglobin: 12.2 g/dL (ref 12.0–15.0)
Lymphocytes Relative: 41.7 % (ref 12.0–46.0)
MCHC: 32.7 g/dL (ref 30.0–36.0)
Monocytes Relative: 7.3 % (ref 3.0–12.0)
Neutro Abs: 2.9 10*3/uL (ref 1.4–7.7)
Neutrophils Relative %: 48.5 % (ref 43.0–77.0)
RBC: 4.26 Mil/uL (ref 3.87–5.11)
WBC: 6 10*3/uL (ref 4.5–10.5)

## 2010-11-21 LAB — LIPID PANEL
Cholesterol: 180 mg/dL (ref 0–200)
HDL: 50.1 mg/dL (ref >39.00)
Total CHOL/HDL Ratio: 4
Triglycerides: 108 mg/dL (ref 0.0–149.0)

## 2010-11-21 LAB — MICROALBUMIN / CREATININE URINE RATIO
Creatinine,U: 124.1 mg/dL
Microalb, Ur: 2.3 mg/dL — ABNORMAL HIGH (ref 0.0–1.9)

## 2010-11-21 MED ORDER — PROMETHAZINE HCL 25 MG PO TABS: 25.0000 mg | ORAL_TABLET | Freq: Four times a day (QID) | ORAL | Status: DC | PRN

## 2010-11-21 MED ORDER — OMEPRAZOLE 20 MG PO CPDR: 20.0000 mg | DELAYED_RELEASE_CAPSULE | Freq: Two times a day (BID) | ORAL | Status: DC

## 2010-11-21 MED ORDER — GABAPENTIN 600 MG PO TABS: 600.0000 mg | ORAL_TABLET | Freq: Three times a day (TID) | ORAL | Status: DC

## 2010-11-21 MED ORDER — LEVOFLOXACIN 250 MG PO TABS: 250.0000 mg | ORAL_TABLET | Freq: Every day | ORAL | Status: AC

## 2010-11-21 MED ORDER — HYDROCODONE-HOMATROPINE 5-1.5 MG/5ML PO SYRP: 5.0000 mL | ORAL_SOLUTION | Freq: Four times a day (QID) | ORAL | Status: AC | PRN

## 2010-11-21 NOTE — Progress Notes (Signed)
Subjective:    Patient ID: Christy May, female    DOB: 05-17-1951, 60 y.o.   MRN: [id number]  HPI Pt says she takes levemir, 80 units bid, as rx'ed.  no cbg record, but states cbg's have increased to 400-500 recently.  pt states she feels well in general, except for lightheadedness.  She says she had an episode at home of "talking funny," approx 1 month ago.  It resolved 30 mins after taking po sugar.   Past Medical History  Diagnosis Date  . Achilles bursitis or tendinitis   . Anemia, unspecified   . Extrinsic asthma, unspecified   . Backache, unspecified   . Bronchitis, not specified as acute or chronic   . Chest pain, unspecified   . Chronic airway obstruction, not elsewhere classified   . Spondylosis of unspecified site without mention of myelopathy   . Dehydration   . Depressive disorder, not elsewhere classified   . Type II or unspecified type diabetes mellitus without mention of complication, not stated as uncontrolled   . Pain in limb   . Esophageal reflux   . Pain in joint, pelvic region and thigh   . Unspecified essential hypertension   . Unspecified menopausal and postmenopausal disorder   . Nausea alone   . Obesity, unspecified   . Inflammatory and toxic neuropathy, unspecified   . Routine general medical examination at a health care facility   . Other enthesopathy of ankle and tarsus   . Unspecified sleep apnea   . Urinary frequency   . Unspecified vitamin D deficiency   . Wheezing   . Dysphagia 2007    historyof dysphagia with severe dysmotility by barium swallow-[name]  . COPD (chronic obstructive pulmonary disease) 10/02/2010    Past Surgical History  Procedure Date  . Laser sugery     bilateral eyes  . Tonsillectomy   . Back surgery     fusion-multiple cervical spine levels-Dr. Lovell Sheehan  . Excision of abcess     ?? Chest  . Cholecystectomy   . Dilation and curettage of uterus   . Exploratory laparotomy   . Partial hysterectomy   . Total  knee arthroplasty 2005    Left  . Total knee arthroplasty 2005    Right- Dr. Turner Daniels  . Foot surgery     Right X 2    History   Social History  . Marital Status: Widowed    Spouse Name: N/A    Number of Children: N/A  . Years of Education: N/A   Occupational History  . [employer] (former)    Social History Main Topics  . Smoking status: Former Smoker    Quit date: 06/22/1986  . Smokeless tobacco: Not on file  . Alcohol Use: No  . Drug Use: No  . Sexually Active: Not on file   Other Topics Concern  . Not on file   Social History Narrative   Patient does not get regular exercise.Widowed 2004Disabled    Current Outpatient Prescriptions on File Prior to Visit  Medication Sig Dispense Refill  . albuterol (PROVENTIL) (2.5 MG/3ML) 0.083% nebulizer solution Take 2.5 mg by nebulization every 6 (six) hours as needed.        Marland Kitchen aspirin 81 MG tablet Take 81 mg by mouth daily.        . cholecalciferol (VITAMIN D) 1000 UNITS tablet Take 2,000 Units by mouth daily.        . clindamycin (CLEOCIN) 150 MG capsule Take 150 mg by  mouth as directed. Take 4 capsules 1 hour before dental procedures.        . Fluticasone-Salmeterol (ADVAIR DISKUS) 100-50 MCG/DOSE AEPB Inhale 1 puff into the lungs every 12 (twelve) hours.        . gabapentin (NEURONTIN) 600 MG tablet Take 1 tablet (600 mg total) by mouth 3 (three) times daily.  270 tablet  1  . HYDROcodone-homatropine (HYCODAN) 5-1.5 MG/5ML syrup Take 5 mLs by mouth every 6 (six) hours as needed for cough.  120 mL  0  . insulin detemir (LEVEMIR) 100 UNIT/ML injection Inject 80 Units into the skin 2 (two) times daily.        . irbesartan (AVAPRO) 300 MG tablet Take 1 tablet (300 mg total) by mouth daily.  90 tablet  3  . levofloxacin (LEVAQUIN) 250 MG tablet Take 1 tablet (250 mg total) by mouth daily.  10 tablet  0  . metoprolol (LOPRESSOR) 50 MG tablet Take 1.5 tablets (75 mg total) by mouth 2 (two) times daily.  270 tablet  3  . Multiple  Vitamin (MULTIVITAMIN) tablet Take 1 tablet by mouth daily.        . nitroGLYCERIN (NITRODUR - DOSED IN MG/24 HR) 0.2 mg/hr Place 0.25 patches onto the skin daily. To achilles tendon every day       . omeprazole (PRILOSEC) 20 MG capsule Take 1 capsule (20 mg total) by mouth 2 (two) times daily.  60 capsule  11  . potassium chloride (KLOR-CON) 10 MEQ CR tablet Take 10 mEq by mouth 2 (two) times daily.        . promethazine (PHENERGAN) 25 MG tablet Take 1 tablet (25 mg total) by mouth every 6 (six) hours as needed for nausea.  50 tablet  0  . solifenacin (VESICARE) 10 MG tablet Take 5 mg by mouth daily.        . traMADol (ULTRAM-ER) 300 MG 24 hr tablet Take 300 mg by mouth daily.        Marland Kitchen DISCONTD: predniSONE (DELTASONE) 20 MG tablet 3 tabs by mouth daily x 3 days, then 2 tabs by mouth daily x 2 days then 1 tab by mouth daily x 2 days  15 tablet  0  . DISCONTD: alum & mag hydroxide-simeth (MAALOX ADVANCED MAX ST) 400-400-40 MG/5ML suspension Take by mouth 3 (three) times daily. "turns bottle up and drinks"       . DISCONTD: furosemide (LASIX) 40 MG tablet Take 40 mg by mouth daily.        Marland Kitchen DISCONTD: gabapentin (NEURONTIN) 600 MG tablet TAKE 1 TABLET BY MOUTH THREE TIMES DAILY  270 tablet  1  . DISCONTD: insulin glargine (LANTUS) 100 UNIT/ML injection Inject 80 Units into the skin 2 (two) times daily.        Marland Kitchen DISCONTD: omeprazole (PRILOSEC) 20 MG capsule Take 1 capsule (20 mg total) by mouth 2 (two) times daily.  60 capsule  11  . DISCONTD: promethazine (PHENERGAN) 25 MG tablet Take 25-50 mg by mouth every 6 (six) hours as needed.        Marland Kitchen DISCONTD: promethazine-codeine (PHENERGAN WITH CODEINE) 6.25-10 MG/5ML syrup TAKE 1 TEASPOONFUL BY MOUTH EVERY 4 HOURS AS NEEDED FOR COUGH  120 mL  0    Allergies  Allergen Reactions  . Ace Inhibitors   . Penicillins     Family History  Problem Relation Age of Onset  . Esophageal cancer Brother   . Esophageal cancer Sister     ?  Marland Kitchen  Colon polyps Brother     . Pancreatic cancer Sister     ?  . Diabetes Sister   . Diabetes      Aunt and Uncle  . Heart disease Maternal Grandfather     BP 132/80  Pulse 75  Temp(Src) 97.8 F (36.6 C) (Oral)  Ht 5\' 3"  (1.6 m)  Wt 293 lb (132.904 kg)  BMI 51.90 kg/m2  SpO2 96%    Review of Systems Denies loc.      Objective:   Physical Exam GENERAL: no distress.  Obese Pulses: dorsalis pedis intact bilat.   Feet: no deformity.  no ulcer on the feet.  feet are of normal color and temp.  no edema Neuro: sensation is intact to touch on the feet.    Lab Results  Component Value Date   HGBA1C 11.2* 11/21/2010     Assessment & Plan:  Dm, therapy limited by noncompliance with f/u ov's and with cbg monitoring.  i'll do the best i can.  The weight of evidence is that the episode she describes was not hypoglycemia.

## 2010-11-21 NOTE — Patient Instructions (Addendum)
Take all new medications as prescribed Continue all other medications as before Please go to LAB in the Basement for the blood and/or urine tests to be done today Please call the phone number [phone] (the PhoneTree System) for results of testing in 2-3 days;  When calling, simply dial the number, and when prompted enter the MRN number above (the Medical Record Number) and the # key, then the message should start. We will try to see if Dr Everardo All will see you today before your trip Please keep your appointments with your specialists as you have planned - further appt's with Dr Everardo All Please return in 1 year for your yearly visit, or sooner if needed, with Lab testing done 3-5 days before OK to cancel your appt in August 2012 since you were here today

## 2010-11-21 NOTE — Progress Notes (Signed)
Subjective:    Patient ID: Christy May, female    DOB: [birth year], 60 y.o.   MRN: [id number]  HPI Here for wellness and f/u;  Overall doing ok;  Pt denies CP, worsening SOB, DOE, wheezing, orthopnea, PND, worsening LE edema, palpitations, dizziness or syncope.  Pt denies neurological change such as new Headache, facial or extremity weakness.  Pt denies polydipsia, polyuria, or low sugar symptoms. Pt states overall good compliance with treatment and medications, good tolerability, and trying to follow lower cholesterol diet.  Pt denies worsening depressive symptoms, suicidal ideation or panic. No fever, wt loss, night sweats, loss of appetite, or other constitutional symptoms.  Pt states good ability with ADL's, low fall risk, home safety reviewed and adequate, no significant changes in hearing or vision, and occasionally active with exercise.  Also - Here with acute onset mild to mod 2-3 days ST, HA, general weakness and malaise, with prod cough greenish sputum.  Blood sugar 2 days ago was 550, today 350 in the office. Leaving for 2 wks in [place] in 3 days.  Has not seen Dr Everardo All since Jan 2012 - did not f/u at 6 wks as reqeusted Past Medical History  Diagnosis Date  . Achilles bursitis or tendinitis   . Anemia, unspecified   . Extrinsic asthma, unspecified   . Backache, unspecified   . Bronchitis, not specified as acute or chronic   . Chest pain, unspecified   . Chronic airway obstruction, not elsewhere classified   . Spondylosis of unspecified site without mention of myelopathy   . Dehydration   . Depressive disorder, not elsewhere classified   . Type II or unspecified type diabetes mellitus without mention of complication, not stated as uncontrolled   . Pain in limb   . Esophageal reflux   . Pain in joint, pelvic region and thigh   . Unspecified essential hypertension   . Unspecified menopausal and postmenopausal disorder   . Nausea alone   . Obesity, unspecified   . Inflammatory  and toxic neuropathy, unspecified   . Routine general medical examination at a health care facility   . Other enthesopathy of ankle and tarsus   . Unspecified sleep apnea   . Urinary frequency   . Unspecified vitamin D deficiency   . Wheezing   . Dysphagia 2007    historyof dysphagia with severe dysmotility by barium swallow-[name]  . COPD (chronic obstructive pulmonary disease) 10/02/2010   Past Surgical History  Procedure Date  . Laser sugery     bilateral eyes  . Tonsillectomy   . Back surgery     fusion-multiple cervical spine levels-Dr. Lovell Sheehan  . Excision of abcess     ?? Chest  . Cholecystectomy   . Dilation and curettage of uterus   . Exploratory laparotomy   . Partial hysterectomy   . Total knee arthroplasty 2005    Left  . Total knee arthroplasty 2005    Right- Dr. Turner Daniels  . Foot surgery     Right X 2    reports that she quit smoking about 24 years ago. She does not have any smokeless tobacco history on file. She reports that she does not drink alcohol or use illicit drugs. family history includes Colon polyps in her brother; Diabetes in her sister and unspecified family member; Esophageal cancer in her brother and sister; Heart disease in her maternal grandfather; and Pancreatic cancer in her sister. Allergies  Allergen Reactions  . Ace Inhibitors   . Penicillins  Current Outpatient Prescriptions on File Prior to Visit  Medication Sig Dispense Refill  . albuterol (PROVENTIL) (2.5 MG/3ML) 0.083% nebulizer solution Take 2.5 mg by nebulization every 6 (six) hours as needed.        Marland Kitchen aspirin 81 MG tablet Take 81 mg by mouth daily.        . cholecalciferol (VITAMIN D) 1000 UNITS tablet Take 2,000 Units by mouth daily.        . clindamycin (CLEOCIN) 150 MG capsule Take 150 mg by mouth as directed. Take 4 capsules 1 hour before dental procedures.        . Fluticasone-Salmeterol (ADVAIR DISKUS) 100-50 MCG/DOSE AEPB Inhale 1 puff into the lungs every 12 (twelve)  hours.        . insulin detemir (LEVEMIR) 100 UNIT/ML injection Inject 80 Units into the skin 2 (two) times daily.        . irbesartan (AVAPRO) 300 MG tablet Take 1 tablet (300 mg total) by mouth daily.  90 tablet  3  . metoprolol (LOPRESSOR) 50 MG tablet Take 1.5 tablets (75 mg total) by mouth 2 (two) times daily.  270 tablet  3  . nitroGLYCERIN (NITRODUR - DOSED IN MG/24 HR) 0.2 mg/hr Place 0.25 patches onto the skin daily. To achilles tendon every day       . potassium chloride (KLOR-CON) 10 MEQ CR tablet Take 10 mEq by mouth 2 (two) times daily.        . predniSONE (DELTASONE) 20 MG tablet 3 tabs by mouth daily x 3 days, then 2 tabs by mouth daily x 2 days then 1 tab by mouth daily x 2 days  15 tablet  0  . solifenacin (VESICARE) 10 MG tablet Take 5 mg by mouth daily.        . traMADol (ULTRAM-ER) 300 MG 24 hr tablet Take 300 mg by mouth daily.        Marland Kitchen DISCONTD: alum & mag hydroxide-simeth (MAALOX ADVANCED MAX ST) 400-400-40 MG/5ML suspension Take by mouth 3 (three) times daily. "turns bottle up and drinks"       . DISCONTD: gabapentin (NEURONTIN) 600 MG tablet TAKE 1 TABLET BY MOUTH THREE TIMES DAILY  270 tablet  1  . DISCONTD: omeprazole (PRILOSEC) 20 MG capsule Take 1 capsule (20 mg total) by mouth 2 (two) times daily.  60 capsule  11  . DISCONTD: promethazine-codeine (PHENERGAN WITH CODEINE) 6.25-10 MG/5ML syrup TAKE 1 TEASPOONFUL BY MOUTH EVERY 4 HOURS AS NEEDED FOR COUGH  120 mL  0  . Multiple Vitamin (MULTIVITAMIN) tablet Take 1 tablet by mouth daily.        Marland Kitchen DISCONTD: furosemide (LASIX) 40 MG tablet Take 40 mg by mouth daily.        Marland Kitchen DISCONTD: insulin glargine (LANTUS) 100 UNIT/ML injection Inject 80 Units into the skin 2 (two) times daily.        Marland Kitchen DISCONTD: promethazine (PHENERGAN) 25 MG tablet Take 25-50 mg by mouth every 6 (six) hours as needed.         Review of Systems Review of Systems  Constitutional: Negative for diaphoresis, activity change, appetite change and unexpected  weight change.  HENT: Negative for hearing loss, ear pain, facial swelling, mouth sores and neck stiffness.   Eyes: Negative for pain, redness and visual disturbance.  Respiratory: Negative for shortness of breath and wheezing.   Cardiovascular: Negative for chest pain and palpitations.  Gastrointestinal: Negative for diarrhea, blood in stool, abdominal distention and rectal pain.  Genitourinary: Negative for hematuria, flank pain and decreased urine volume.  Musculoskeletal: Negative for myalgias and joint swelling.  Skin: Negative for color change and wound.  Neurological: Negative for syncope and numbness.  Hematological: Negative for adenopathy.  Psychiatric/Behavioral: Negative for hallucinations, self-injury, decreased concentration and agitation.      Objective:   Physical Exam BP 132/80  Pulse 75  Temp(Src) 97.8 F (36.6 C) (Oral)  Ht 5\' 4"  (1.626 m)  Wt 293 lb (132.904 kg)  BMI 50.29 kg/m2  SpO2 96% Physical Exam  VS noted, mild ill Constitutional: Pt is oriented to person, place, and time. Appears well-developed and well-nourished.  HENT:  Head: Normocephalic and atraumatic.  Right Ear: External ear normal.  Left Ear: External ear normal.  Nose: Nose normal.  Bilat tm's mild erythema.  Sinus nontender.  Pharynx mild erythema Mouth/Throat: Oropharynx is clear and moist.  Eyes: Conjunctivae and EOM are normal. Pupils are equal, round, and reactive to light.  Neck: Normal range of motion. Neck supple. No JVD present. No tracheal deviation present.  Cardiovascular: Normal rate, regular rhythm, normal heart sounds and intact distal pulses.   Pulmonary/Chest: Effort normal and breath sounds normal.  Abdominal: Soft. Bowel sounds are normal. There is no tenderness.  Musculoskeletal: Normal range of motion. Exhibits no edema.  Lymphadenopathy:  Has no cervical adenopathy.  Neurological: Pt is alert and oriented to person, place, and time. Pt has normal reflexes. No cranial  nerve deficit.  Skin: Skin is warm and dry. No rash noted.  Psychiatric:  Has  normal mood and affect. Behavior is normal.        Assessment & Plan:

## 2010-11-21 NOTE — Patient Instructions (Addendum)
check your blood sugar 4 times a day.  vary the time of day when you check, between before the 3 meals, and at bedtime.  also check if you have symptoms of your blood sugar being too high or too low.  please keep a record of the readings and bring it to your next appointment here.  please call us sooner if you are having low blood sugar episodes.  blood tests are being ordered for you today.  please call [phone] to hear your test results.  You will be prompted to enter the 9-digit "MRN" number that appears at the top left of this page, followed by #.  Then you will hear the message.  Please make a follow-up appointment in 6 weeks. (update: i left message on phone-tree:  increase levemir to 100 units bid, carefully check for lows).

## 2010-11-21 NOTE — Telephone Encounter (Signed)
Faxed harcopy to pharmacy

## 2010-11-24 ENCOUNTER — Telehealth: Payer: Self-pay

## 2010-11-24 NOTE — Telephone Encounter (Signed)
Pharmacy called on pt's behalf requesting early refill of Tramadol. Pt is going out of town today at noon. Last filled 05/09. Okay to authorize early refill?

## 2010-11-24 NOTE — Telephone Encounter (Signed)
Pharmacy advised. I called pt to advised of same, left message on home VM, mobile VM not set up to received messages.

## 2010-11-24 NOTE — Telephone Encounter (Signed)
Ok for early refill this time.

## 2010-12-17 ENCOUNTER — Encounter: Payer: Self-pay | Admitting: Podiatry

## 2010-12-19 ENCOUNTER — Ambulatory Visit: Payer: PRIVATE HEALTH INSURANCE | Admitting: Endocrinology

## 2010-12-22 ENCOUNTER — Emergency Department (HOSPITAL_COMMUNITY)
Admission: EM | Admit: 2010-12-22 | Discharge: 2010-12-22 | Disposition: A | Discharge status: Home / Self Care | Payer: No Typology Code available for payment source | Attending: Emergency Medicine | Admitting: Emergency Medicine

## 2010-12-22 ENCOUNTER — Emergency Department (HOSPITAL_COMMUNITY): Payer: No Typology Code available for payment source

## 2010-12-22 ENCOUNTER — Ambulatory Visit: Payer: PRIVATE HEALTH INSURANCE | Admitting: Endocrinology

## 2010-12-22 DIAGNOSIS — E119 Type 2 diabetes mellitus without complications: Secondary | ICD-10-CM | POA: Insufficient documentation

## 2010-12-22 DIAGNOSIS — S335XXA Sprain of ligaments of lumbar spine, initial encounter: Secondary | ICD-10-CM | POA: Insufficient documentation

## 2010-12-22 DIAGNOSIS — Y92410 Unspecified street and highway as the place of occurrence of the external cause: Secondary | ICD-10-CM | POA: Insufficient documentation

## 2010-12-22 DIAGNOSIS — T148XXA Other injury of unspecified body region, initial encounter: Secondary | ICD-10-CM | POA: Insufficient documentation

## 2010-12-22 DIAGNOSIS — I10 Essential (primary) hypertension: Secondary | ICD-10-CM | POA: Insufficient documentation

## 2010-12-22 DIAGNOSIS — S139XXA Sprain of joints and ligaments of unspecified parts of neck, initial encounter: Secondary | ICD-10-CM | POA: Insufficient documentation

## 2010-12-25 ENCOUNTER — Encounter: Payer: Self-pay | Admitting: Endocrinology

## 2010-12-25 ENCOUNTER — Ambulatory Visit (INDEPENDENT_AMBULATORY_CARE_PROVIDER_SITE_OTHER): Payer: PRIVATE HEALTH INSURANCE | Admitting: Endocrinology

## 2010-12-25 DIAGNOSIS — E119 Type 2 diabetes mellitus without complications: Secondary | ICD-10-CM

## 2010-12-25 MED ORDER — INSULIN GLARGINE 100 UNIT/ML ~~LOC~~ SOLN: SUBCUTANEOUS | Status: DC

## 2010-12-25 NOTE — Patient Instructions (Addendum)
check your blood sugar 2 times a day.  vary the time of day when you check, between before the 3 meals, and at bedtime.  also check if you have symptoms of your blood sugar being too high or too low.  please keep a record of the readings and bring it to your next appointment here.  please call us sooner if you are having low blood sugar episodes.  Take "miralax," (non-prescription), 2x a day. I hope you feel better soon.  If you don't feel better by next week, please call dr Jonny Ruiz.   Please make a follow-up appointment in 2 weeks.  Change levemir to lantus 180 unit each morning.

## 2010-12-25 NOTE — Progress Notes (Signed)
Subjective:    Patient ID: Christy May, female    DOB: July 15, 1950, 60 y.o.   MRN: [id number]  HPI Pt says she takes levemir, 80 units bid.  She says she did not call to receive lab result from last ov.  no cbg record, but states she "presumes it is high."  She has trouble remembering the pm dose of levemir. She has not taken any insulin today.  She reports few days of slight generalized abdominal pain, and assoc constipation.  She takes otc laxatives. Past Medical History  Diagnosis Date  . Achilles bursitis or tendinitis   . Anemia, unspecified   . Extrinsic asthma, unspecified   . Backache, unspecified   . Bronchitis, not specified as acute or chronic   . Chest pain, unspecified   . Chronic airway obstruction, not elsewhere classified   . Spondylosis of unspecified site without mention of myelopathy   . Dehydration   . Depressive disorder, not elsewhere classified   . Type II or unspecified type diabetes mellitus without mention of complication, not stated as uncontrolled   . Pain in limb   . Esophageal reflux   . Pain in joint, pelvic region and thigh   . Unspecified essential hypertension   . Unspecified menopausal and postmenopausal disorder   . Nausea alone   . Obesity, unspecified   . Inflammatory and toxic neuropathy, unspecified   . Routine general medical examination at a health care facility   . Other enthesopathy of ankle and tarsus   . Unspecified sleep apnea   . Urinary frequency   . Unspecified vitamin D deficiency   . Wheezing   . Dysphagia 2007    historyof dysphagia with severe dysmotility by barium swallow-[name]  . COPD (chronic obstructive pulmonary disease) 10/02/2010    Past Surgical History  Procedure Date  . Laser sugery     bilateral eyes  . Tonsillectomy   . Back surgery     fusion-multiple cervical spine levels-Dr. Lovell Sheehan  . Excision of abcess     ?? Chest  . Cholecystectomy   . Dilation and curettage of uterus   . Exploratory  laparotomy   . Partial hysterectomy   . Total knee arthroplasty 2005    Left  . Total knee arthroplasty 2005    Right- Dr. Turner Daniels  . Foot surgery     Right X 2    History   Social History  . Marital Status: Widowed    Spouse Name: N/A    Number of Children: N/A  . Years of Education: N/A   Occupational History  . [employer] (former)    Social History Main Topics  . Smoking status: Former Smoker    Quit date: 06/22/1986  . Smokeless tobacco: Not on file  . Alcohol Use: No  . Drug Use: No  . Sexually Active: Not on file   Other Topics Concern  . Not on file   Social History Narrative   Patient does not get regular exercise.Widowed 2004Disabled    Current Outpatient Prescriptions on File Prior to Visit  Medication Sig Dispense Refill  . albuterol (PROVENTIL) (2.5 MG/3ML) 0.083% nebulizer solution Take 2.5 mg by nebulization every 6 (six) hours as needed.        Marland Kitchen aspirin 81 MG tablet Take 81 mg by mouth daily.        . cholecalciferol (VITAMIN D) 1000 UNITS tablet Take 2,000 Units by mouth daily.        . Fluticasone-Salmeterol (  ADVAIR DISKUS) 100-50 MCG/DOSE AEPB Inhale 1 puff into the lungs every 12 (twelve) hours.        . gabapentin (NEURONTIN) 600 MG tablet Take 1 tablet (600 mg total) by mouth 3 (three) times daily.  270 tablet  1  . insulin detemir (LEVEMIR) 100 UNIT/ML injection Inject 100 Units into the skin 2 (two) times daily.       . irbesartan (AVAPRO) 300 MG tablet Take 1 tablet (300 mg total) by mouth daily.  90 tablet  3  . metoprolol (LOPRESSOR) 50 MG tablet Take 1.5 tablets (75 mg total) by mouth 2 (two) times daily.  270 tablet  3  . Multiple Vitamin (MULTIVITAMIN) tablet Take 1 tablet by mouth daily.        . nitroGLYCERIN (NITRODUR - DOSED IN MG/24 HR) 0.2 mg/hr Place 0.25 patches onto the skin daily. To achilles tendon every day       . omeprazole (PRILOSEC) 20 MG capsule Take 1 capsule (20 mg total) by mouth 2 (two) times daily.  60 capsule  11   . potassium chloride (KLOR-CON) 10 MEQ CR tablet Take 10 mEq by mouth 2 (two) times daily.        . solifenacin (VESICARE) 10 MG tablet Take 5 mg by mouth daily.        . traMADol (ULTRAM-ER) 300 MG 24 hr tablet Take 300 mg by mouth daily.        . clindamycin (CLEOCIN) 150 MG capsule Take 150 mg by mouth as directed. Take 4 capsules 1 hour before dental procedures.          Allergies  Allergen Reactions  . Ace Inhibitors   . Penicillins     Family History  Problem Relation Age of Onset  . Esophageal cancer Brother   . Esophageal cancer Sister     ?  . Colon polyps Brother   . Pancreatic cancer Sister     ?  . Diabetes Sister   . Diabetes      Aunt and Uncle  . Heart disease Maternal Grandfather    BP 130/88  Pulse 126  Temp(Src) 98.5 F (36.9 C) (Oral)  Ht 5\' 3"  (1.6 m)  Wt 276 lb 12.8 oz (125.556 kg)  BMI 49.03 kg/m2  SpO2 96% Review of Systems Denies weight change and brbpr.    Objective:   Physical Exam GENERAL: no distress.  Morbidly obese ABDOMEN: abdomen is soft, nontender.  no hepatosplenomegaly.   not distended.  no hernia    Assessment & Plan:  Dm, therapy limited by noncompliance.  She needs a simpler (qd) insulin regimen.  i'll do the best i can. Constipation, new.

## 2010-12-30 ENCOUNTER — Other Ambulatory Visit: Payer: Self-pay | Admitting: Internal Medicine

## 2011-01-02 ENCOUNTER — Telehealth: Payer: Self-pay

## 2011-01-02 MED ORDER — HYDROCODONE-ACETAMINOPHEN 5-325 MG PO TABS: 1.0000 | ORAL_TABLET | Freq: Four times a day (QID) | ORAL | Status: DC | PRN

## 2011-01-02 MED ORDER — CYCLOBENZAPRINE HCL 10 MG PO TABS: 10.0000 mg | ORAL_TABLET | Freq: Three times a day (TID) | ORAL | Status: DC | PRN

## 2011-01-02 NOTE — Telephone Encounter (Signed)
Pt informed, rx faxed to pharmacy

## 2011-01-02 NOTE — Telephone Encounter (Signed)
Flexeril sent escript  Other Done hardcopy to dahlia/LIM B
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
## 2011-01-05 ENCOUNTER — Ambulatory Visit (INDEPENDENT_AMBULATORY_CARE_PROVIDER_SITE_OTHER): Payer: PRIVATE HEALTH INSURANCE | Admitting: Internal Medicine

## 2011-01-05 ENCOUNTER — Encounter: Payer: Self-pay | Admitting: Internal Medicine

## 2011-01-05 VITALS — BP 100/62 | HR 90 | Temp 98.0°F | Ht 64.0 in | Wt 283.2 lb

## 2011-01-05 DIAGNOSIS — I10 Essential (primary) hypertension: Secondary | ICD-10-CM

## 2011-01-05 DIAGNOSIS — M542 Cervicalgia: Secondary | ICD-10-CM

## 2011-01-05 DIAGNOSIS — E119 Type 2 diabetes mellitus without complications: Secondary | ICD-10-CM

## 2011-01-05 MED ORDER — PREDNISONE 10 MG PO TABS: 10.0000 mg | ORAL_TABLET | Freq: Every day | ORAL | Status: AC

## 2011-01-05 NOTE — Patient Instructions (Signed)
Take all new medications as prescribed - the prednisone Continue all other medications as before
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
## 2011-01-05 NOTE — Assessment & Plan Note (Signed)
overall uncontrolled recetnly, by hx and exam, most recent data reviewed with pt, and pt to continue medical treatment as before with Dr Everardo All, but to check sugars on the prednisone - call for > 250-300  Lab Results  Component Value Date   HGBA1C 11.2* 11/21/2010

## 2011-01-05 NOTE — Assessment & Plan Note (Signed)
Moderate per pt, with subjective increase in bilat UE paresthesias;  Exam intact to neurological,  Consider MRI if persists or worsens, for predpack for now,  to f/u any worsening symptoms or concerns prn

## 2011-01-05 NOTE — Assessment & Plan Note (Signed)
stable overall by hx and exam, most recent data reviewed with pt, and pt to continue medical treatment as before  BP Readings from Last 3 Encounters:  01/05/11 100/62  12/25/10 130/88  11/21/10 132/80

## 2011-01-08 ENCOUNTER — Ambulatory Visit (INDEPENDENT_AMBULATORY_CARE_PROVIDER_SITE_OTHER): Payer: PRIVATE HEALTH INSURANCE | Admitting: Endocrinology

## 2011-01-08 ENCOUNTER — Encounter: Payer: Self-pay | Admitting: Endocrinology

## 2011-01-08 DIAGNOSIS — E119 Type 2 diabetes mellitus without complications: Secondary | ICD-10-CM

## 2011-01-08 NOTE — Progress Notes (Signed)
Subjective:    Patient ID: Christy May, female    DOB: [birth year], 60 y.o.   MRN: [id number]  HPI pt states she feels well in general.  she brings a record of her cbg's which i have reviewed today.  It varies from 95-300, with no trend throughout the day.  However, most cbg's are in the 100's.  She took only a few of the prednisone tabs she was rx'ed.   Past Medical History  Diagnosis Date  . Achilles bursitis or tendinitis   . Anemia, unspecified   . Extrinsic asthma, unspecified   . Backache, unspecified   . Bronchitis, not specified as acute or chronic   . Chest pain, unspecified   . Chronic airway obstruction, not elsewhere classified   . Spondylosis of unspecified site without mention of myelopathy   . Dehydration   . Depressive disorder, not elsewhere classified   . Type II or unspecified type diabetes mellitus without mention of complication, not stated as uncontrolled   . Pain in limb   . Esophageal reflux   . Pain in joint, pelvic region and thigh   . Unspecified essential hypertension   . Unspecified menopausal and postmenopausal disorder   . Nausea alone   . Obesity, unspecified   . Inflammatory and toxic neuropathy, unspecified   . Routine general medical examination at a health care facility   . Other enthesopathy of ankle and tarsus   . Unspecified sleep apnea   . Urinary frequency   . Unspecified vitamin D deficiency   . Wheezing   . Dysphagia 2007    historyof dysphagia with severe dysmotility by barium swallow-[name]  . COPD (chronic obstructive pulmonary disease) 10/02/2010    Past Surgical History  Procedure Date  . Laser sugery     bilateral eyes  . Tonsillectomy   . Back surgery     fusion-multiple cervical spine levels-Dr. Lovell Sheehan  . Excision of abcess     ?? Chest  . Cholecystectomy   . Dilation and curettage of uterus   . Exploratory laparotomy   . Partial hysterectomy   . Total knee arthroplasty 2005    Left  . Total knee  arthroplasty 2005    Right- Dr. Turner Daniels  . Foot surgery     Right X 2    History   Social History  . Marital Status: Widowed    Spouse Name: N/A    Number of Children: N/A  . Years of Education: N/A   Occupational History  . [employer] (former)    Social History Main Topics  . Smoking status: Former Smoker    Quit date: 06/22/1986  . Smokeless tobacco: Not on file  . Alcohol Use: No  . Drug Use: No  . Sexually Active: Not on file   Other Topics Concern  . Not on file   Social History Narrative   Patient does not get regular exercise.Widowed 2004Disabled    Current Outpatient Prescriptions on File Prior to Visit  Medication Sig Dispense Refill  . albuterol (PROVENTIL) (2.5 MG/3ML) 0.083% nebulizer solution Take 2.5 mg by nebulization every 6 (six) hours as needed.        Marland Kitchen aspirin 81 MG tablet Take 81 mg by mouth daily.        . cholecalciferol (VITAMIN D) 1000 UNITS tablet Take 2,000 Units by mouth daily.        . clindamycin (CLEOCIN) 150 MG capsule Take 150 mg by mouth as directed. Take 4 capsules  1 hour before dental procedures.        . cyclobenzaprine (FLEXERIL) 10 MG tablet Take 1 tablet (10 mg total) by mouth 3 (three) times daily as needed.  90 tablet  1  . Fluticasone-Salmeterol (ADVAIR DISKUS) 100-50 MCG/DOSE AEPB Inhale 1 puff into the lungs every 12 (twelve) hours.        . gabapentin (NEURONTIN) 600 MG tablet Take 1 tablet (600 mg total) by mouth 3 (three) times daily.  270 tablet  1  . HYDROcodone-acetaminophen (NORCO) 5-325 MG per tablet Take 1 tablet by mouth every 6 (six) hours as needed. For pain  30 tablet  0  . insulin glargine (LANTUS) 100 UNIT/ML injection 180 units each morning, and syringes, 1 daily.  60 mL  11  . irbesartan (AVAPRO) 300 MG tablet Take 1 tablet (300 mg total) by mouth daily.  90 tablet  3  . metoprolol (LOPRESSOR) 50 MG tablet Take 1.5 tablets (75 mg total) by mouth 2 (two) times daily.  270 tablet  3  . Multiple Vitamin  (MULTIVITAMIN) tablet Take 1 tablet by mouth daily.        . nitroGLYCERIN (NITRODUR - DOSED IN MG/24 HR) 0.2 mg/hr Place 0.25 patches onto the skin daily. To achilles tendon every day       . omeprazole (PRILOSEC) 20 MG capsule Take 1 capsule (20 mg total) by mouth 2 (two) times daily.  60 capsule  11  . potassium chloride (KLOR-CON) 10 MEQ CR tablet Take 10 mEq by mouth 2 (two) times daily.        . predniSONE (DELTASONE) 10 MG tablet Take 1 tablet (10 mg total) by mouth daily. 3 tabs by mouth per day for 3 days, then 2 tabs per day for 3 days, then 1 tab per day for 3 days, then stop   18 tablet  0  . traMADol (ULTRAM-ER) 300 MG 24 hr tablet Take 300 mg by mouth daily.        . VESICARE 10 MG tablet TAKE 1 TABLET BY MOUTH DAILY  90 tablet  1    Allergies  Allergen Reactions  . Ace Inhibitors   . Penicillins     Family History  Problem Relation Age of Onset  . Esophageal cancer Brother   . Esophageal cancer Sister     ?  . Colon polyps Brother   . Pancreatic cancer Sister     ?  . Diabetes Sister   . Diabetes      Aunt and Uncle  . Heart disease Maternal Grandfather     BP 126/78  Pulse 92  Temp(Src) 97.4 F (36.3 C) (Oral)  Ht 5\' 4"  (1.626 m)  Wt 293 lb 12.8 oz (133.267 kg)  BMI 50.43 kg/m2  SpO2 94%    Review of Systems denies hypoglycemia.    Objective:   Physical Exam GENERAL: no distress SKIN: Insulin injection sites at the anterior abdomen are normal       Assessment & Plan:  Dm, with improved control.  therapy limited by pt's need for a simple regimen

## 2011-01-08 NOTE — Patient Instructions (Addendum)
check your blood sugar 2 times a day.  vary the time of day when you check, between before the 3 meals, and at bedtime.  also check if you have symptoms of your blood sugar being too high or too low.  please keep a record of the readings and bring it to your next appointment here.  please call us sooner if you are having low blood sugar episodes.    Please make a follow-up appointment in 2 months.  Continue lantus 180 unit each morning.

## 2011-01-19 ENCOUNTER — Ambulatory Visit: Payer: PRIVATE HEALTH INSURANCE | Admitting: Endocrinology

## 2011-01-20 ENCOUNTER — Ambulatory Visit (INDEPENDENT_AMBULATORY_CARE_PROVIDER_SITE_OTHER): Payer: PRIVATE HEALTH INSURANCE | Admitting: Endocrinology

## 2011-01-20 ENCOUNTER — Encounter: Payer: Self-pay | Admitting: Endocrinology

## 2011-01-20 ENCOUNTER — Ambulatory Visit: Payer: PRIVATE HEALTH INSURANCE | Admitting: Endocrinology

## 2011-01-20 ENCOUNTER — Other Ambulatory Visit: Payer: Self-pay | Admitting: Internal Medicine

## 2011-01-20 VITALS — BP 108/80 | HR 90 | Temp 98.1°F | Ht 64.0 in | Wt 289.8 lb

## 2011-01-20 DIAGNOSIS — E119 Type 2 diabetes mellitus without complications: Secondary | ICD-10-CM

## 2011-01-20 DIAGNOSIS — R079 Chest pain, unspecified: Secondary | ICD-10-CM | POA: Insufficient documentation

## 2011-01-20 LAB — GLUCOSE, POCT (MANUAL RESULT ENTRY): POC Glucose: 230

## 2011-01-20 NOTE — Patient Instructions (Addendum)
check your blood sugar 2 times a day.  vary the time of day when you check, between before the 3 meals, and at bedtime.  also check if you have symptoms of your blood sugar being too high or too low.  please keep a record of the readings and bring it to your next appointment here.  please call us sooner if you are having low blood sugar episodes.    Please make a follow-up appointment in 1 month.  increase lantus to 200 unit each morning.   Let's check a "treadmill" heart test.  you will receive a phone call, with a day and time for an appointment.

## 2011-01-20 NOTE — Progress Notes (Signed)
Subjective:    Patient ID: Christy May, female    DOB: Jan 29, 1951, 60 y.o.   MRN: [id number]  HPI no cbg record, but states cbg's vary from 119-300.  She reports 6 years of intermittent moderate pain the the chest.  It is worse in the context of hyperglycemia.  she has associated diaphoresis and disorientation.   Past Medical History  Diagnosis Date  . Achilles bursitis or tendinitis   . Anemia, unspecified   . Extrinsic asthma, unspecified   . Backache, unspecified   . Bronchitis, not specified as acute or chronic   . Chest pain, unspecified   . Chronic airway obstruction, not elsewhere classified   . Spondylosis of unspecified site without mention of myelopathy   . Dehydration   . Depressive disorder, not elsewhere classified   . Type II or unspecified type diabetes mellitus without mention of complication, not stated as uncontrolled   . Pain in limb   . Esophageal reflux   . Pain in joint, pelvic region and thigh   . Unspecified essential hypertension   . Unspecified menopausal and postmenopausal disorder   . Nausea alone   . Obesity, unspecified   . Inflammatory and toxic neuropathy, unspecified   . Routine general medical examination at a health care facility   . Other enthesopathy of ankle and tarsus   . Unspecified sleep apnea   . Urinary frequency   . Unspecified vitamin D deficiency   . Wheezing   . Dysphagia 2007    historyof dysphagia with severe dysmotility by barium swallow-[name]  . COPD (chronic obstructive pulmonary disease) 10/02/2010    Past Surgical History  Procedure Date  . Laser sugery     bilateral eyes  . Tonsillectomy   . Back surgery     fusion-multiple cervical spine levels-Dr. Lovell Sheehan  . Excision of abcess     ?? Chest  . Cholecystectomy   . Dilation and curettage of uterus   . Exploratory laparotomy   . Partial hysterectomy   . Total knee arthroplasty 2005    Left  . Total knee arthroplasty 2005    Right- Dr. Turner Daniels  . Foot  surgery     Right X 2    History   Social History  . Marital Status: Widowed    Spouse Name: N/A    Number of Children: N/A  . Years of Education: N/A   Occupational History  . [employer] (former)    Social History Main Topics  . Smoking status: Former Smoker    Quit date: 06/22/1986  . Smokeless tobacco: Not on file  . Alcohol Use: No  . Drug Use: No  . Sexually Active: Not on file   Other Topics Concern  . Not on file   Social History Narrative   Patient does not get regular exercise.Widowed 2004Disabled    Current Outpatient Prescriptions on File Prior to Visit  Medication Sig Dispense Refill  . albuterol (PROVENTIL) (2.5 MG/3ML) 0.083% nebulizer solution Take 2.5 mg by nebulization every 6 (six) hours as needed.        Marland Kitchen aspirin 81 MG tablet Take 81 mg by mouth daily.        . cholecalciferol (VITAMIN D) 1000 UNITS tablet Take 2,000 Units by mouth daily.        . clindamycin (CLEOCIN) 150 MG capsule Take 150 mg by mouth as directed. Take 4 capsules 1 hour before dental procedures.        . cyclobenzaprine (FLEXERIL)  10 MG tablet Take 1 tablet (10 mg total) by mouth 3 (three) times daily as needed.  90 tablet  1  . Fluticasone-Salmeterol (ADVAIR DISKUS) 100-50 MCG/DOSE AEPB Inhale 1 puff into the lungs every 12 (twelve) hours.        . gabapentin (NEURONTIN) 600 MG tablet Take 1 tablet (600 mg total) by mouth 3 (three) times daily.  270 tablet  1  . HYDROcodone-acetaminophen (NORCO) 5-325 MG per tablet Take 1 tablet by mouth every 6 (six) hours as needed. For pain  30 tablet  0  . insulin glargine (LANTUS) 100 UNIT/ML injection 180 units each morning, and syringes, 1 daily.  60 mL  11  . irbesartan (AVAPRO) 300 MG tablet Take 1 tablet (300 mg total) by mouth daily.  90 tablet  3  . metoprolol (LOPRESSOR) 50 MG tablet Take 1.5 tablets (75 mg total) by mouth 2 (two) times daily.  270 tablet  3  . Multiple Vitamin (MULTIVITAMIN) tablet Take 1 tablet by mouth daily.         . nitroGLYCERIN (NITRODUR - DOSED IN MG/24 HR) 0.2 mg/hr Place 0.25 patches onto the skin daily. To achilles tendon every day       . omeprazole (PRILOSEC) 20 MG capsule Take 1 capsule (20 mg total) by mouth 2 (two) times daily.  60 capsule  11  . potassium chloride (KLOR-CON) 10 MEQ CR tablet Take 10 mEq by mouth 2 (two) times daily.        . traMADol (ULTRAM-ER) 300 MG 24 hr tablet Take 300 mg by mouth daily.        . VESICARE 10 MG tablet TAKE 1 TABLET BY MOUTH DAILY  90 tablet  1    Allergies  Allergen Reactions  . Ace Inhibitors   . Penicillins     Family History  Problem Relation Age of Onset  . Esophageal cancer Brother   . Esophageal cancer Sister     ?  . Colon polyps Brother   . Pancreatic cancer Sister     ?  . Diabetes Sister   . Diabetes      Aunt and Uncle  . Heart disease Maternal Grandfather     BP 108/80  Pulse 90  Temp(Src) 98.1 F (36.7 C) (Oral)  Ht 5\' 4"  (1.626 m)  Wt 289 lb 12.8 oz (131.452 kg)  BMI 49.74 kg/m2  SpO2 95%  Review of Systems denies hypoglycemia and loc.    Objective:   Physical Exam GENERAL: no distress.  Obese LUNGS:  Clear to auscultation HEART: Regular rate and rhythm without murmurs noted. Normal S1,S2.    (i reviewed ecg)    Assessment & Plan:  Dm, needs increased rx Chest sxs, uncertain etiology.  New

## 2011-01-23 ENCOUNTER — Other Ambulatory Visit: Payer: Self-pay | Admitting: Internal Medicine

## 2011-02-03 ENCOUNTER — Other Ambulatory Visit: Payer: Self-pay

## 2011-02-03 MED ORDER — INSULIN GLARGINE 100 UNIT/ML ~~LOC~~ SOLN: 200.0000 [IU] | Freq: Every day | SUBCUTANEOUS | Status: DC

## 2011-02-03 NOTE — Telephone Encounter (Signed)
Pt called requesting refill of Insulin

## 2011-02-10 ENCOUNTER — Other Ambulatory Visit: Payer: Self-pay

## 2011-02-10 NOTE — Telephone Encounter (Signed)
Has appt aug 22, can be addressed at that time

## 2011-02-11 ENCOUNTER — Ambulatory Visit: Payer: PRIVATE HEALTH INSURANCE | Admitting: Internal Medicine

## 2011-02-11 ENCOUNTER — Encounter: Payer: Self-pay | Admitting: Internal Medicine

## 2011-02-11 ENCOUNTER — Ambulatory Visit (INDEPENDENT_AMBULATORY_CARE_PROVIDER_SITE_OTHER): Payer: Medicare Other | Admitting: Internal Medicine

## 2011-02-11 VITALS — BP 102/70 | HR 95 | Temp 97.8°F | Ht 64.0 in | Wt 295.2 lb

## 2011-02-11 DIAGNOSIS — M545 Low back pain, unspecified: Secondary | ICD-10-CM

## 2011-02-11 DIAGNOSIS — M5412 Radiculopathy, cervical region: Secondary | ICD-10-CM

## 2011-02-11 DIAGNOSIS — I10 Essential (primary) hypertension: Secondary | ICD-10-CM

## 2011-02-11 MED ORDER — CYCLOBENZAPRINE HCL 5 MG PO TABS: 5.0000 mg | ORAL_TABLET | Freq: Three times a day (TID) | ORAL | Status: DC | PRN

## 2011-02-11 MED ORDER — OMEPRAZOLE 20 MG PO CPDR: 20.0000 mg | DELAYED_RELEASE_CAPSULE | Freq: Two times a day (BID) | ORAL | Status: DC

## 2011-02-11 MED ORDER — HYDROCODONE-ACETAMINOPHEN 5-325 MG PO TABS: 1.0000 | ORAL_TABLET | Freq: Four times a day (QID) | ORAL | Status: DC | PRN

## 2011-02-11 NOTE — Progress Notes (Signed)
Subjective:    Patient ID: Christy May, female    DOB: 1951-04-09, 60 y.o.   MRN: [id number]  HPI  Here to f/u; unfortunately has not improved , in fact has persisted and now worse with post neck pain worse right than left, with pain/weaknes/tingling to RUE for at least 2-3 wks; no bowel or bladder change, fever, wt loss,  worsening LE pain/numbness/weakness, gait change or falls. Denies worsening reflux, dysphagia, abd pain, n/v, bowel change or blood except for mild increased reflux as she has run out of the omeprazole.  Pt denies chest pain, increased sob or doe, wheezing, orthopnea, PND, increased LE swelling, palpitations, dizziness or syncope.  Pt denies new neurological symptoms such as new headache, or facial or extremity weakness or numbness   Pt denies polydipsia. Overall good compliance with treatment, and good medicine tolerability. Also with left lower back discomfort as well but no radiation or assoc with LE symptoms Past Medical History  Diagnosis Date  . Achilles bursitis or tendinitis   . Anemia, unspecified   . Extrinsic asthma, unspecified   . Backache, unspecified   . Bronchitis, not specified as acute or chronic   . Chest pain, unspecified   . Chronic airway obstruction, not elsewhere classified   . Spondylosis of unspecified site without mention of myelopathy   . Dehydration   . Depressive disorder, not elsewhere classified   . Type II or unspecified type diabetes mellitus without mention of complication, not stated as uncontrolled   . Pain in limb   . Esophageal reflux   . Pain in joint, pelvic region and thigh   . Unspecified essential hypertension   . Unspecified menopausal and postmenopausal disorder   . Nausea alone   . Obesity, unspecified   . Inflammatory and toxic neuropathy, unspecified   . Routine general medical examination at a health care facility   . Other enthesopathy of ankle and tarsus   . Unspecified sleep apnea   . Urinary frequency   .  Unspecified vitamin D deficiency   . Wheezing   . Dysphagia 2007    historyof dysphagia with severe dysmotility by barium swallow-[name]  . COPD (chronic obstructive pulmonary disease) 10/02/2010   Past Surgical History  Procedure Date  . Laser sugery     bilateral eyes  . Tonsillectomy   . Back surgery     fusion-multiple cervical spine levels-Dr. Lovell Sheehan  . Excision of abcess     ?? Chest  . Cholecystectomy   . Dilation and curettage of uterus   . Exploratory laparotomy   . Partial hysterectomy   . Total knee arthroplasty 2005    Left  . Total knee arthroplasty 2005    Right- Dr. Turner Daniels  . Foot surgery     Right X 2    reports that she quit smoking about 24 years ago. She does not have any smokeless tobacco history on file. She reports that she does not drink alcohol or use illicit drugs. family history includes Colon polyps in her brother; Diabetes in her sister and unspecified family member; Esophageal cancer in her brother and sister; Heart disease in her maternal grandfather; and Pancreatic cancer in her sister. Allergies  Allergen Reactions  . Ace Inhibitors   . Penicillins    Current Outpatient Prescriptions on File Prior to Visit  Medication Sig Dispense Refill  . albuterol (PROVENTIL) (2.5 MG/3ML) 0.083% nebulizer solution Take 2.5 mg by nebulization every 6 (six) hours as needed.        Marland Kitchen  aspirin 81 MG tablet Take 81 mg by mouth daily.        . cholecalciferol (VITAMIN D) 1000 UNITS tablet Take 2,000 Units by mouth daily.        . clindamycin (CLEOCIN) 150 MG capsule Take 150 mg by mouth as directed. Take 4 capsules 1 hour before dental procedures.        . Fluticasone-Salmeterol (ADVAIR DISKUS) 100-50 MCG/DOSE AEPB Inhale 1 puff into the lungs every 12 (twelve) hours.        . gabapentin (NEURONTIN) 600 MG tablet Take 1 tablet (600 mg total) by mouth 3 (three) times daily.  270 tablet  1  . insulin glargine (LANTUS) 100 UNIT/ML injection Inject 200 Units into  the skin daily. and syringes, 1 daily.  70 mL  2  . irbesartan (AVAPRO) 300 MG tablet Take 1 tablet (300 mg total) by mouth daily.  90 tablet  3  . metoprolol (LOPRESSOR) 50 MG tablet Take 1.5 tablets (75 mg total) by mouth 2 (two) times daily.  270 tablet  3  . Multiple Vitamin (MULTIVITAMIN) tablet Take 1 tablet by mouth daily.        . nitroGLYCERIN (NITRODUR - DOSED IN MG/24 HR) 0.2 mg/hr Place 0.25 patches onto the skin daily. To achilles tendon every day       . omeprazole (PRILOSEC) 20 MG capsule Take 1 capsule (20 mg total) by mouth 2 (two) times daily.  60 capsule  11  . potassium chloride (KLOR-CON) 10 MEQ CR tablet Take 10 mEq by mouth 2 (two) times daily.        . traMADol (ULTRAM-ER) 300 MG 24 hr tablet Take 300 mg by mouth daily.        . VESICARE 10 MG tablet TAKE 1 TABLET BY MOUTH DAILY  90 tablet  1   Review of Systems Review of Systems  Constitutional: Negative for diaphoresis and unexpected weight change.  HENT: Negative for drooling and tinnitus.   Eyes: Negative for photophobia and visual disturbance.  Respiratory: Negative for choking and stridor.   Gastrointestinal: Negative for vomiting and blood in stool.  Genitourinary: Negative for hematuria and decreased urine volume.      Objective:   Physical Exam BP 102/70  Pulse 95  Temp(Src) 97.8 F (36.6 C) (Oral)  Ht 5\' 4"  (1.626 m)  Wt 295 lb 4 oz (133.925 kg)  BMI 50.68 kg/m2  SpO2 98% Physical Exam  VS noted Constitutional: Pt appears well-developed and well-nourished.  HENT: Head: Normocephalic.  Right Ear: External ear normal.  Left Ear: External ear normal.  Eyes: Conjunctivae and EOM are normal. Pupils are equal, round, and reactive to light.  Neck: Normal range of motion. Neck supple.  Cardiovascular: Normal rate and regular rhythm.   Pulmonary/Chest: Effort normal and breath sounds normal.  Neurological: Pt is alert. No cranial nerve deficit. morot/sens/DTR/gait ok except for RUE 4+/5 distal strength  loss/grip strength loss Skin: Skin is warm. No erythema.  Psychiatric: Pt behavior is normal. Thought content normal.  Left lumbar paravertebral muscular spasm noted Spine nontender except for low cervical midline, also tender right paracervical and trapiezoid area Right shoulder FROM, NT    Assessment & Plan:

## 2011-02-11 NOTE — Assessment & Plan Note (Signed)
Mild worsening symptoms with abnormal exam - for MRI c-spine, and surgical referral, cont pain med

## 2011-02-11 NOTE — Assessment & Plan Note (Signed)
C/w msk strain - for flexeril 5 mg prn,  to f/u any worsening symptoms or concerns

## 2011-02-11 NOTE — Patient Instructions (Addendum)
Continue all other medications as before - the flexeril  5 mg was sent to the pharmacy, and the pain medication was given hardcopy You will be contacted regarding the referral for: orthopedic (Dr Turner Daniels), and Neck MRI  OK to cancel the August 31 appt , since you were here today  Please return in 6 months, or sooner if needed

## 2011-02-11 NOTE — Assessment & Plan Note (Signed)
stable overall by hx and exam, most recent data reviewed with pt, and pt to continue medical treatment as before  BP Readings from Last 3 Encounters:  02/11/11 102/70  01/20/11 108/80  01/08/11 126/78

## 2011-02-18 ENCOUNTER — Other Ambulatory Visit: Payer: Medicare Other

## 2011-02-18 ENCOUNTER — Ambulatory Visit
Admission: RE | Admit: 2011-02-18 | Discharge: 2011-02-18 | Disposition: A | Discharge status: Home / Self Care | Payer: Medicare Other | Source: Ambulatory Visit | Attending: Internal Medicine | Admitting: Internal Medicine

## 2011-02-18 DIAGNOSIS — M5412 Radiculopathy, cervical region: Secondary | ICD-10-CM

## 2011-02-20 ENCOUNTER — Ambulatory Visit: Payer: PRIVATE HEALTH INSURANCE | Admitting: Endocrinology

## 2011-02-20 ENCOUNTER — Encounter: Payer: PRIVATE HEALTH INSURANCE | Admitting: Internal Medicine

## 2011-02-20 DIAGNOSIS — Z029 Encounter for administrative examinations, unspecified: Secondary | ICD-10-CM

## 2011-03-04 ENCOUNTER — Telehealth: Payer: Self-pay | Admitting: *Deleted

## 2011-03-04 DIAGNOSIS — Z131 Encounter for screening for diabetes mellitus: Secondary | ICD-10-CM

## 2011-03-04 DIAGNOSIS — J449 Chronic obstructive pulmonary disease, unspecified: Secondary | ICD-10-CM

## 2011-03-04 NOTE — Telephone Encounter (Signed)
Referral done per emr 

## 2011-03-04 NOTE — Telephone Encounter (Signed)
Patient requesting referral for home health nursing services.

## 2011-03-13 ENCOUNTER — Ambulatory Visit: Payer: PRIVATE HEALTH INSURANCE | Admitting: Internal Medicine

## 2011-03-13 ENCOUNTER — Other Ambulatory Visit: Payer: Self-pay | Admitting: Internal Medicine

## 2011-03-19 ENCOUNTER — Telehealth: Payer: Self-pay | Admitting: *Deleted

## 2011-03-19 NOTE — Telephone Encounter (Signed)
Please ask pt to make f/u appt with Dr Everardo All, as at her last visit July 31, he asked to see her back in 1 month

## 2011-03-19 NOTE — Telephone Encounter (Signed)
Pt was advised to call office and notify if cbg was above 300. This am fasting was 317, she took insulin as directed but wanted to notify office.

## 2011-03-20 NOTE — Telephone Encounter (Signed)
Called the patient left message to call back 

## 2011-03-20 NOTE — Telephone Encounter (Signed)
Patient is informed.

## 2011-03-24 ENCOUNTER — Ambulatory Visit: Payer: No Typology Code available for payment source | Attending: Neurosurgery | Admitting: Physical Therapy

## 2011-03-24 DIAGNOSIS — IMO0001 Reserved for inherently not codable concepts without codable children: Secondary | ICD-10-CM | POA: Insufficient documentation

## 2011-03-24 DIAGNOSIS — M2550 Pain in unspecified joint: Secondary | ICD-10-CM | POA: Insufficient documentation

## 2011-03-24 DIAGNOSIS — M2560 Stiffness of unspecified joint, not elsewhere classified: Secondary | ICD-10-CM | POA: Insufficient documentation

## 2011-03-24 DIAGNOSIS — M6281 Muscle weakness (generalized): Secondary | ICD-10-CM | POA: Insufficient documentation

## 2011-03-25 LAB — D-DIMER, QUANTITATIVE: D-Dimer, Quant: 0.27

## 2011-03-25 LAB — POCT I-STAT, CHEM 8
BUN: 25 — ABNORMAL HIGH
Calcium, Ion: 1.16
Chloride: 102
Glucose, Bld: 214 — ABNORMAL HIGH
TCO2: 30

## 2011-03-26 ENCOUNTER — Ambulatory Visit (INDEPENDENT_AMBULATORY_CARE_PROVIDER_SITE_OTHER): Payer: Medicare Other | Admitting: Endocrinology

## 2011-03-26 ENCOUNTER — Encounter: Payer: Self-pay | Admitting: Endocrinology

## 2011-03-26 DIAGNOSIS — I10 Essential (primary) hypertension: Secondary | ICD-10-CM

## 2011-03-26 DIAGNOSIS — R079 Chest pain, unspecified: Secondary | ICD-10-CM | POA: Insufficient documentation

## 2011-03-26 DIAGNOSIS — E119 Type 2 diabetes mellitus without complications: Secondary | ICD-10-CM

## 2011-03-26 MED ORDER — INSULIN GLARGINE 100 UNIT/ML ~~LOC~~ SOLN: 225.0000 [IU] | Freq: Every day | SUBCUTANEOUS | Status: DC

## 2011-03-26 MED ORDER — AZITHROMYCIN 500 MG PO TABS: 500.0000 mg | ORAL_TABLET | Freq: Every day | ORAL | Status: AC
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
## 2011-03-26 NOTE — Patient Instructions (Addendum)
check your blood sugar 2 times a day.  vary the time of day when you check, between before the 3 meals, and at bedtime.  also check if you have symptoms of your blood sugar being too high or too low.  please keep a record of the readings and bring it to your next appointment here.  please call us sooner if you are having low blood sugar episodes.    Please make a follow-up appointment in 1 month.  increase lantus to 225 units each morning.   Let's check a "chemical" heart test.  you will receive a phone call, with a day and time for an appointment. Refer to a dietician.  you will receive a phone call, about a day and time for an appointment. i have sent a prescription to your pharmacy, for an antibiotic.

## 2011-03-30 ENCOUNTER — Ambulatory Visit: Payer: No Typology Code available for payment source | Admitting: Rehabilitation

## 2011-04-01 ENCOUNTER — Encounter: Payer: Medicare Other | Admitting: Rehabilitation

## 2011-04-03 ENCOUNTER — Ambulatory Visit: Payer: PRIVATE HEALTH INSURANCE | Admitting: Internal Medicine

## 2011-04-07 ENCOUNTER — Ambulatory Visit: Payer: No Typology Code available for payment source | Admitting: Rehabilitation

## 2011-04-09 ENCOUNTER — Ambulatory Visit: Payer: No Typology Code available for payment source | Admitting: Rehabilitation

## 2011-04-13 ENCOUNTER — Ambulatory Visit: Payer: No Typology Code available for payment source

## 2011-04-15 ENCOUNTER — Encounter: Payer: Medicare Other | Admitting: Rehabilitation

## 2011-04-21 ENCOUNTER — Other Ambulatory Visit: Payer: Self-pay | Admitting: Internal Medicine

## 2011-04-21 ENCOUNTER — Ambulatory Visit: Payer: No Typology Code available for payment source | Admitting: Rehabilitation

## 2011-04-21 NOTE — Telephone Encounter (Signed)
Done per emr 

## 2011-04-23 ENCOUNTER — Ambulatory Visit: Payer: Medicare Other | Attending: Neurosurgery | Admitting: Rehabilitation

## 2011-04-23 DIAGNOSIS — M2560 Stiffness of unspecified joint, not elsewhere classified: Secondary | ICD-10-CM | POA: Insufficient documentation

## 2011-04-23 DIAGNOSIS — M2550 Pain in unspecified joint: Secondary | ICD-10-CM | POA: Insufficient documentation

## 2011-04-23 DIAGNOSIS — IMO0001 Reserved for inherently not codable concepts without codable children: Secondary | ICD-10-CM | POA: Insufficient documentation

## 2011-04-23 DIAGNOSIS — M6281 Muscle weakness (generalized): Secondary | ICD-10-CM | POA: Insufficient documentation

## 2011-04-29 ENCOUNTER — Ambulatory Visit: Payer: Medicare Other | Admitting: Rehabilitation

## 2011-04-30 ENCOUNTER — Ambulatory Visit (INDEPENDENT_AMBULATORY_CARE_PROVIDER_SITE_OTHER): Payer: Medicare Other | Admitting: Endocrinology

## 2011-04-30 ENCOUNTER — Other Ambulatory Visit (INDEPENDENT_AMBULATORY_CARE_PROVIDER_SITE_OTHER): Payer: Medicare Other

## 2011-04-30 ENCOUNTER — Encounter: Payer: Self-pay | Admitting: Endocrinology

## 2011-04-30 DIAGNOSIS — E119 Type 2 diabetes mellitus without complications: Secondary | ICD-10-CM

## 2011-04-30 LAB — HEMOGLOBIN A1C: Hgb A1c MFr Bld: 9.3 % — ABNORMAL HIGH (ref 4.6–6.5)

## 2011-04-30 MED ORDER — INSULIN GLARGINE 100 UNIT/ML ~~LOC~~ SOLN: 250.0000 [IU] | Freq: Every day | SUBCUTANEOUS | Status: DC

## 2011-04-30 NOTE — Patient Instructions (Addendum)
check your blood sugar 2 times a day.  vary the time of day when you check, between before the 3 meals, and at bedtime.  also check if you have symptoms of your blood sugar being too high or too low.  please keep a record of the readings and bring it to your next appointment here.  please call us sooner if you are having low blood sugar episodes.    Please make a follow-up appointment in 3 months. blood tests are being requested for you today.  please call [phone] to hear your test results.  You will be prompted to enter the 9-digit "MRN" number that appears at the top left of this page, followed by #.  Then you will hear the message. continue lantus 225 units each morning.   Let's check a "chemical" heart test.  you will receive a phone call, with a day and time for an appointment.   (update: i left message on phone-tree:  Increase lantus to 250 qam).

## 2011-04-30 NOTE — Progress Notes (Signed)
Subjective:    Patient ID: Christy May, female    DOB: December 05, 1950, 60 y.o.   MRN: [id number]  HPI Since on the increased insulin, she feels better in general.  She says she misses her insulin only once/month.  She does not check cbg's. She missed her appointment for the [facility].  She has occasional chest pain, relieved by norco.   Past Medical History  Diagnosis Date  . Achilles bursitis or tendinitis   . Anemia, unspecified   . Extrinsic asthma, unspecified   . Backache, unspecified   . Bronchitis, not specified as acute or chronic   . Chest pain, unspecified   . Chronic airway obstruction, not elsewhere classified   . Spondylosis of unspecified site without mention of myelopathy   . Dehydration   . Depressive disorder, not elsewhere classified   . Type II or unspecified type diabetes mellitus without mention of complication, not stated as uncontrolled   . Pain in limb   . Esophageal reflux   . Pain in joint, pelvic region and thigh   . Unspecified essential hypertension   . Unspecified menopausal and postmenopausal disorder   . Nausea alone   . Obesity, unspecified   . Inflammatory and toxic neuropathy, unspecified   . Routine general medical examination at a health care facility   . Other enthesopathy of ankle and tarsus   . Unspecified sleep apnea   . Urinary frequency   . Unspecified vitamin D deficiency   . Wheezing   . Dysphagia 2007    historyof dysphagia with severe dysmotility by barium swallow-[name]  . COPD (chronic obstructive pulmonary disease) 10/02/2010    Past Surgical History  Procedure Date  . Laser sugery     bilateral eyes  . Tonsillectomy   . Back surgery     fusion-multiple cervical spine levels-Dr. Lovell Sheehan  . Excision of abcess     ?? Chest  . Cholecystectomy   . Dilation and curettage of uterus   . Exploratory laparotomy   . Partial hysterectomy   . Total knee arthroplasty 2005    Left  . Total knee arthroplasty 2005    Right-  Dr. Turner Daniels  . Foot surgery     Right X 2    History   Social History  . Marital Status: Widowed    Spouse Name: N/A    Number of Children: N/A  . Years of Education: N/A   Occupational History  . [employer] (former)    Social History Main Topics  . Smoking status: Former Smoker    Quit date: 06/22/1986  . Smokeless tobacco: Not on file  . Alcohol Use: No  . Drug Use: No  . Sexually Active: Not on file   Other Topics Concern  . Not on file   Social History Narrative   Patient does not get regular exercise.Widowed 2004Disabled    Current Outpatient Prescriptions on File Prior to Visit  Medication Sig Dispense Refill  . albuterol (PROVENTIL) (2.5 MG/3ML) 0.083% nebulizer solution Take 2.5 mg by nebulization every 6 (six) hours as needed.        Marland Kitchen aspirin 81 MG tablet Take 81 mg by mouth daily.        . cholecalciferol (VITAMIN D) 1000 UNITS tablet Take 2,000 Units by mouth daily.        . clindamycin (CLEOCIN) 150 MG capsule Take 150 mg by mouth as directed. Take 4 capsules 1 hour before dental procedures.        Marland Kitchen  cyclobenzaprine (FLEXERIL) 10 MG tablet Take 10 mg by mouth 3 (three) times daily as needed.        . Fluticasone-Salmeterol (ADVAIR DISKUS) 100-50 MCG/DOSE AEPB Inhale 1 puff into the lungs every 12 (twelve) hours.        . gabapentin (NEURONTIN) 600 MG tablet Take 1 tablet (600 mg total) by mouth 3 (three) times daily.  270 tablet  1  . HYDROcodone-acetaminophen (NORCO) 5-325 MG per tablet Take 1 tablet by mouth every 6 (six) hours as needed. For pain  60 tablet  1  . irbesartan (AVAPRO) 300 MG tablet Take 1 tablet (300 mg total) by mouth daily.  90 tablet  3  . KLOR-CON 10 10 MEQ CR tablet TAKE 1 TABLET BY MOUTH TWICE DAILY  180 tablet  3  . metoprolol (LOPRESSOR) 50 MG tablet Take 1.5 tablets (75 mg total) by mouth 2 (two) times daily.  270 tablet  3  . Multiple Vitamin (MULTIVITAMIN) tablet Take 1 tablet by mouth daily.        . nitroGLYCERIN (NITRODUR  - DOSED IN MG/24 HR) 0.2 mg/hr Place 0.25 patches onto the skin daily. To achilles tendon every day       . traMADol (ULTRAM-ER) 300 MG 24 hr tablet TAKE 1 TABLET BY MOUTH ONCE DAILY  90 tablet  0  . VESICARE 10 MG tablet TAKE 1 TABLET BY MOUTH DAILY  90 tablet  1  . omeprazole (PRILOSEC) 20 MG capsule Take 1 capsule (20 mg total) by mouth 2 (two) times daily.  60 capsule  11    Allergies  Allergen Reactions  . Ace Inhibitors   . Penicillins     Family History  Problem Relation Age of Onset  . Esophageal cancer Brother   . Esophageal cancer Sister     ?  . Colon polyps Brother   . Pancreatic cancer Sister     ?  . Diabetes Sister   . Diabetes      Aunt and Uncle  . Heart disease Maternal Grandfather     BP 130/82  Pulse 82  Temp(Src) 97 F (36.1 C) (Oral)  Ht 5\' 2"  (1.575 m)  Wt 300 lb (136.079 kg)  BMI 54.87 kg/m2  SpO2 96%  Review of Systems denies hypoglycemia.    Objective:   Physical Exam VITAL SIGNS:  See vs page GENERAL: no distress.  Obese.   Pulses: dorsalis pedis intact bilat.   Feet: no deformity.  no ulcer on the feet.  feet are of normal color and temp.  no edema Neuro: sensation is intact to touch on the feet.     Lab Results  Component Value Date   HGBA1C 9.3* 04/30/2011      Assessment & Plan:  DM, needs increased rx

## 2011-05-05 ENCOUNTER — Ambulatory Visit: Payer: Medicare Other | Admitting: Physical Therapy

## 2011-05-07 ENCOUNTER — Ambulatory Visit: Payer: Medicare Other | Admitting: Rehabilitation

## 2011-05-11 ENCOUNTER — Ambulatory Visit: Payer: Medicare Other | Admitting: Rehabilitation

## 2011-05-12 ENCOUNTER — Other Ambulatory Visit: Payer: Self-pay | Admitting: Internal Medicine

## 2011-05-13 ENCOUNTER — Ambulatory Visit (INDEPENDENT_AMBULATORY_CARE_PROVIDER_SITE_OTHER): Payer: Medicare Other | Admitting: Internal Medicine

## 2011-05-13 ENCOUNTER — Encounter: Payer: Self-pay | Admitting: Internal Medicine

## 2011-05-13 ENCOUNTER — Ambulatory Visit: Payer: Medicare Other | Admitting: Rehabilitation

## 2011-05-13 VITALS — BP 132/84 | HR 102 | Temp 99.3°F | Wt 298.8 lb

## 2011-05-13 DIAGNOSIS — J209 Acute bronchitis, unspecified: Secondary | ICD-10-CM

## 2011-05-13 DIAGNOSIS — J45909 Unspecified asthma, uncomplicated: Secondary | ICD-10-CM

## 2011-05-13 MED ORDER — LEVOFLOXACIN 500 MG PO TABS: 500.0000 mg | ORAL_TABLET | Freq: Every day | ORAL | Status: AC

## 2011-05-13 MED ORDER — PROMETHAZINE-CODEINE 6.25-10 MG/5ML PO SYRP: 5.0000 mL | ORAL_SOLUTION | ORAL | Status: AC | PRN

## 2011-05-13 NOTE — Patient Instructions (Signed)
It was good to see you today. Levaquin antibiotics to for one week and codeine cough syrup as discussed - Your prescription(s) have been submitted to your pharmacy. Please take as directed and contact our office if you believe you are having problem(s) with the medication(s). Tylenol and hydration, rest as you're able If symptoms unimproved in 10-14 days, call for reevaluation - or call sooner if symptoms are worse

## 2011-05-13 NOTE — Progress Notes (Signed)
Subjective:    Patient ID: Christy May, female    DOB: 05/23/1951, 60 y.o.   MRN: [id number]  Cough This is a new problem. The current episode started in the past 7 days. The problem has been gradually worsening. The problem occurs constantly. The cough is productive of sputum. Associated symptoms include chest pain, a fever (low grade), myalgias, nasal congestion, postnasal drip, a sore throat and shortness of breath. Pertinent negatives include no heartburn, hemoptysis, weight loss or wheezing. The symptoms are aggravated by nothing. She has tried nothing for the symptoms. Her past medical history is significant for asthma and COPD.    Past Medical History  Diagnosis Date  . Anemia, unspecified   . Extrinsic asthma, unspecified   . Chronic airway obstruction, not elsewhere classified   . Spondylosis of unspecified site without mention of myelopathy   . Dehydration   . Depressive disorder, not elsewhere classified   . Type II or unspecified type diabetes mellitus without mention of complication, not stated as uncontrolled   . Esophageal reflux   . Unspecified essential hypertension   . Unspecified menopausal and postmenopausal disorder   . Obesity, unspecified   . Inflammatory and toxic neuropathy, unspecified   . Unspecified sleep apnea   . Unspecified vitamin D deficiency   . Dysphagia 2007    historyof dysphagia with severe dysmotility by barium swallow-[name]     Review of Systems  Constitutional: Positive for fever (low grade). Negative for weight loss.  HENT: Positive for sore throat and postnasal drip.   Respiratory: Positive for cough and shortness of breath. Negative for hemoptysis and wheezing.   Cardiovascular: Positive for chest pain.  Gastrointestinal: Negative for heartburn.  Musculoskeletal: Positive for myalgias.       Objective:   Physical Exam BP 132/84  Pulse 102  Temp(Src) 99.3 F (37.4 C) (Oral)  Wt 298 lb 12.8 oz (135.535 kg)  SpO2  97% Wt Readings from Last 3 Encounters:  05/13/11 298 lb 12.8 oz (135.535 kg)  04/30/11 300 lb (136.079 kg)  03/26/11 303 lb 3.2 oz (137.531 kg)   Constitutional: She is obese, but appears well-developed and well-nourished. Mildly ill, but no acute distress.  HENT: Head: Normocephalic and atraumatic. Ears: B TMs red but no effusion; Nose: Clear rhinorrhea -mild sinus tenderness to palpation. Mouth/Throat: Oropharynx is moderately red but clear and moist. No oropharyngeal exudate.  Eyes: Conjunctivae and EOM are normal. Pupils are equal, round, and reactive to light. No scleral icterus.  Neck: Normal range of motion. Neck supple. No JVD present. No thyromegaly present.  Cardiovascular: Normal rate, regular rhythm and normal heart sounds.  No murmur heard. No BLE edema. Pulmonary/Chest: Effort normal and breath sounds diminished at bases bilaterally. No respiratory distress. She has no wheezes.   Lab Results  Component Value Date   WBC 6.0 11/21/2010   HGB 12.2 11/21/2010   HCT 37.4 11/21/2010   PLT 196.0 11/21/2010   GLUCOSE 306* 11/21/2010   CHOL 180 11/21/2010   TRIG 108.0 11/21/2010   HDL 50.10 11/21/2010   LDLCALC 108* 11/21/2010   ALT 23 11/21/2010   AST 18 11/21/2010   NA 138 11/21/2010   K 4.7 11/21/2010   CL 103 11/21/2010   CREATININE 0.9 11/21/2010   BUN 22 11/21/2010   CO2 28 11/21/2010   TSH 1.66 11/21/2010   HGBA1C 9.3* 04/30/2011   MICROALBUR 2.3* 11/21/2010        Assessment & Plan:   Bronchitis, acute Asthma, currently  no bronchospasm or flare  Treatment with Levaquin daily x1 week and prescription cough suppression Advise compliance with usual asthma medications as previously prescribed Hydrate, Tylenol as needed and rest Patient to call if symptoms unimproved in next 10-14 days, sooner if worse

## 2011-05-19 ENCOUNTER — Encounter: Payer: Medicare Other | Admitting: Physical Therapy

## 2011-05-21 ENCOUNTER — Encounter: Payer: Medicare Other | Admitting: Rehabilitation

## 2011-05-27 ENCOUNTER — Ambulatory Visit: Payer: Medicare Other | Attending: Neurosurgery | Admitting: Rehabilitation

## 2011-05-27 DIAGNOSIS — IMO0001 Reserved for inherently not codable concepts without codable children: Secondary | ICD-10-CM | POA: Insufficient documentation

## 2011-05-27 DIAGNOSIS — M2550 Pain in unspecified joint: Secondary | ICD-10-CM | POA: Insufficient documentation

## 2011-05-27 DIAGNOSIS — M6281 Muscle weakness (generalized): Secondary | ICD-10-CM | POA: Insufficient documentation

## 2011-05-27 DIAGNOSIS — M2560 Stiffness of unspecified joint, not elsewhere classified: Secondary | ICD-10-CM | POA: Insufficient documentation

## 2011-06-08 ENCOUNTER — Telehealth: Payer: Self-pay | Admitting: *Deleted

## 2011-06-08 NOTE — Telephone Encounter (Signed)
Pt informed of MD's advisement. Pt has received new meter from [facility].

## 2011-06-08 NOTE — Telephone Encounter (Signed)
Please continue same insulin.  Do you need a new rx for a meter?
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
## 2011-06-24 ENCOUNTER — Emergency Department (INDEPENDENT_AMBULATORY_CARE_PROVIDER_SITE_OTHER): Payer: Medicare Other

## 2011-06-24 ENCOUNTER — Emergency Department (HOSPITAL_BASED_OUTPATIENT_CLINIC_OR_DEPARTMENT_OTHER)
Admission: EM | Admit: 2011-06-24 | Discharge: 2011-06-24 | Disposition: A | Discharge status: Home / Self Care | Payer: Medicare Other | Attending: Emergency Medicine | Admitting: Emergency Medicine

## 2011-06-24 ENCOUNTER — Encounter (HOSPITAL_BASED_OUTPATIENT_CLINIC_OR_DEPARTMENT_OTHER): Payer: Self-pay | Admitting: *Deleted

## 2011-06-24 DIAGNOSIS — M545 Low back pain, unspecified: Secondary | ICD-10-CM | POA: Insufficient documentation

## 2011-06-24 DIAGNOSIS — I10 Essential (primary) hypertension: Secondary | ICD-10-CM | POA: Insufficient documentation

## 2011-06-24 DIAGNOSIS — R52 Pain, unspecified: Secondary | ICD-10-CM | POA: Diagnosis not present

## 2011-06-24 DIAGNOSIS — J449 Chronic obstructive pulmonary disease, unspecified: Secondary | ICD-10-CM | POA: Insufficient documentation

## 2011-06-24 DIAGNOSIS — Y93E1 Activity, personal bathing and showering: Secondary | ICD-10-CM | POA: Insufficient documentation

## 2011-06-24 DIAGNOSIS — Z79899 Other long term (current) drug therapy: Secondary | ICD-10-CM | POA: Diagnosis not present

## 2011-06-24 DIAGNOSIS — K219 Gastro-esophageal reflux disease without esophagitis: Secondary | ICD-10-CM | POA: Insufficient documentation

## 2011-06-24 DIAGNOSIS — J4489 Other specified chronic obstructive pulmonary disease: Secondary | ICD-10-CM | POA: Insufficient documentation

## 2011-06-24 DIAGNOSIS — Z7982 Long term (current) use of aspirin: Secondary | ICD-10-CM | POA: Insufficient documentation

## 2011-06-24 DIAGNOSIS — M25519 Pain in unspecified shoulder: Secondary | ICD-10-CM

## 2011-06-24 DIAGNOSIS — W07XXXA Fall from chair, initial encounter: Secondary | ICD-10-CM | POA: Insufficient documentation

## 2011-06-24 DIAGNOSIS — S61219A Laceration without foreign body of unspecified finger without damage to nail, initial encounter: Secondary | ICD-10-CM

## 2011-06-24 DIAGNOSIS — F3289 Other specified depressive episodes: Secondary | ICD-10-CM | POA: Insufficient documentation

## 2011-06-24 DIAGNOSIS — S61209A Unspecified open wound of unspecified finger without damage to nail, initial encounter: Secondary | ICD-10-CM

## 2011-06-24 DIAGNOSIS — M79609 Pain in unspecified limb: Secondary | ICD-10-CM | POA: Diagnosis not present

## 2011-06-24 DIAGNOSIS — M549 Dorsalgia, unspecified: Secondary | ICD-10-CM | POA: Diagnosis not present

## 2011-06-24 DIAGNOSIS — M542 Cervicalgia: Secondary | ICD-10-CM | POA: Diagnosis not present

## 2011-06-24 DIAGNOSIS — W19XXXA Unspecified fall, initial encounter: Secondary | ICD-10-CM

## 2011-06-24 DIAGNOSIS — M25549 Pain in joints of unspecified hand: Secondary | ICD-10-CM | POA: Diagnosis not present

## 2011-06-24 DIAGNOSIS — F329 Major depressive disorder, single episode, unspecified: Secondary | ICD-10-CM | POA: Diagnosis not present

## 2011-06-24 DIAGNOSIS — Z9889 Other specified postprocedural states: Secondary | ICD-10-CM | POA: Diagnosis not present

## 2011-06-24 MED ORDER — LIDOCAINE HCL 2 % IJ SOLN
INTRAMUSCULAR | Status: AC
  Filled 2011-06-24: qty 1

## 2011-06-24 MED ORDER — HYDROCODONE-ACETAMINOPHEN 5-500 MG PO TABS: 1.0000 | ORAL_TABLET | Freq: Four times a day (QID) | ORAL | Status: DC | PRN

## 2011-06-24 MED ORDER — HYDROCODONE-ACETAMINOPHEN 5-325 MG PO TABS
1.0000 | ORAL_TABLET | Freq: Once | ORAL | Status: AC
  Administered 2011-06-24: 1 via ORAL
  Filled 2011-06-24: qty 2
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
## 2011-06-24 NOTE — ED Provider Notes (Signed)
Medical screening examination/treatment/procedure(s) were performed by non-physician practitioner and as supervising physician I was immediately available for consultation/collaboration.  Billy Rocco, MD 06/24/11 1615 
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
## 2011-07-01 ENCOUNTER — Ambulatory Visit: Payer: Medicare Other | Admitting: Internal Medicine

## 2011-07-02 ENCOUNTER — Ambulatory Visit (INDEPENDENT_AMBULATORY_CARE_PROVIDER_SITE_OTHER): Payer: Medicare Other | Admitting: Internal Medicine

## 2011-07-02 ENCOUNTER — Encounter: Payer: Self-pay | Admitting: Internal Medicine

## 2011-07-02 VITALS — BP 122/64 | HR 87 | Temp 97.4°F | Ht 60.0 in | Wt 310.2 lb

## 2011-07-02 DIAGNOSIS — M545 Low back pain: Secondary | ICD-10-CM

## 2011-07-02 DIAGNOSIS — R0781 Pleurodynia: Secondary | ICD-10-CM | POA: Insufficient documentation

## 2011-07-02 DIAGNOSIS — M25562 Pain in left knee: Secondary | ICD-10-CM

## 2011-07-02 DIAGNOSIS — M25569 Pain in unspecified knee: Secondary | ICD-10-CM | POA: Diagnosis not present

## 2011-07-02 DIAGNOSIS — S61209A Unspecified open wound of unspecified finger without damage to nail, initial encounter: Secondary | ICD-10-CM | POA: Diagnosis not present

## 2011-07-02 DIAGNOSIS — S61219A Laceration without foreign body of unspecified finger without damage to nail, initial encounter: Secondary | ICD-10-CM

## 2011-07-02 MED ORDER — HYDROCODONE-ACETAMINOPHEN 5-325 MG PO TABS: 1.0000 | ORAL_TABLET | Freq: Four times a day (QID) | ORAL | Status: DC | PRN

## 2011-07-02 NOTE — Patient Instructions (Signed)
Take all new medications as prescribed - the pain medication Continue all other medications as before Your stitches were removed today Please return in 6 months, or sooner if needed

## 2011-07-05 ENCOUNTER — Encounter: Payer: Self-pay | Admitting: Internal Medicine
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
## 2011-07-05 NOTE — Assessment & Plan Note (Signed)
C/w acute strain, for med refills o/w neuro exam stable,  to f/u any worsening symptoms or concerns

## 2011-07-05 NOTE — Assessment & Plan Note (Signed)
C/w mild medial strain liekly related to the fall, exam benign,  to f/u any worsening symptoms or concerns
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
## 2011-07-05 NOTE — Assessment & Plan Note (Signed)
No signs of infection - for stitches out today, to f/u any worsening symptoms or concerns

## 2011-07-06 ENCOUNTER — Telehealth: Payer: Self-pay
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
## 2011-07-07 DIAGNOSIS — H04229 Epiphora due to insufficient drainage, unspecified lacrimal gland: Secondary | ICD-10-CM | POA: Diagnosis not present

## 2011-07-07 DIAGNOSIS — H04559 Acquired stenosis of unspecified nasolacrimal duct: Secondary | ICD-10-CM | POA: Diagnosis not present

## 2011-07-23 DIAGNOSIS — H04559 Acquired stenosis of unspecified nasolacrimal duct: Secondary | ICD-10-CM | POA: Diagnosis not present

## 2011-07-23 DIAGNOSIS — H0489 Other disorders of lacrimal system: Secondary | ICD-10-CM | POA: Diagnosis not present

## 2011-07-24 ENCOUNTER — Other Ambulatory Visit: Payer: Self-pay | Admitting: Internal Medicine

## 2011-07-24 NOTE — Telephone Encounter (Signed)
Done hardcopy to [facility], with tramadol done per emr

## 2011-07-24 NOTE — Telephone Encounter (Signed)
Faxed hardcopy to pharmacy. 

## 2011-07-28 ENCOUNTER — Other Ambulatory Visit: Payer: Self-pay | Admitting: Internal Medicine

## 2011-07-28 DIAGNOSIS — Z1231 Encounter for screening mammogram for malignant neoplasm of breast: Secondary | ICD-10-CM

## 2011-07-30 ENCOUNTER — Ambulatory Visit: Payer: Medicare Other | Admitting: Endocrinology

## 2011-07-30 DIAGNOSIS — Z0289 Encounter for other administrative examinations: Secondary | ICD-10-CM

## 2011-08-04 ENCOUNTER — Telehealth: Payer: Self-pay | Admitting: *Deleted

## 2011-08-04 NOTE — Telephone Encounter (Signed)
Pt wants to know if there any information on Pt assistance for Lantus. Will research information and call pt back.

## 2011-08-06 NOTE — Telephone Encounter (Signed)
Left message for pt to callback office for information to contact [employer] (manufacturer of Lantus). (Phone # is [phone])

## 2011-08-07 NOTE — Telephone Encounter (Signed)
Pt informed of information

## 2011-08-12 ENCOUNTER — Other Ambulatory Visit: Payer: Self-pay | Admitting: *Deleted

## 2011-08-12 MED ORDER — INSULIN GLARGINE 100 UNIT/ML ~~LOC~~ SOLN: 250.0000 [IU] | Freq: Every day | SUBCUTANEOUS | Status: DC

## 2011-08-12 NOTE — Telephone Encounter (Signed)
Pt states that she is completely out of Lantus insulin and needs rx to be sent to pharmacy for refills-pt has been taking 250 units per MD's advisement, but rx sent was for 225 units. Pt needs update rx sent to [facility] and samples of insulin. Pt informed to come in for sample of Lantus Pen and rx sent to Pharmacy as requested.

## 2011-08-18 ENCOUNTER — Encounter: Payer: Self-pay | Admitting: Internal Medicine

## 2011-08-18 ENCOUNTER — Ambulatory Visit: Payer: Medicare Other | Admitting: Endocrinology

## 2011-08-18 ENCOUNTER — Ambulatory Visit (INDEPENDENT_AMBULATORY_CARE_PROVIDER_SITE_OTHER): Payer: Medicare Other | Admitting: Internal Medicine

## 2011-08-18 VITALS — BP 132/78 | HR 84 | Temp 97.9°F | Ht 64.0 in | Wt 307.4 lb

## 2011-08-18 DIAGNOSIS — I10 Essential (primary) hypertension: Secondary | ICD-10-CM

## 2011-08-18 DIAGNOSIS — Z0000 Encounter for general adult medical examination without abnormal findings: Secondary | ICD-10-CM

## 2011-08-18 DIAGNOSIS — M545 Low back pain, unspecified: Secondary | ICD-10-CM

## 2011-08-18 DIAGNOSIS — J019 Acute sinusitis, unspecified: Secondary | ICD-10-CM | POA: Diagnosis not present

## 2011-08-18 DIAGNOSIS — R062 Wheezing: Secondary | ICD-10-CM | POA: Diagnosis not present

## 2011-08-18 MED ORDER — LEVOFLOXACIN 250 MG PO TABS: 250.0000 mg | ORAL_TABLET | Freq: Every day | ORAL | Status: AC

## 2011-08-18 MED ORDER — HYDROCODONE-HOMATROPINE 5-1.5 MG/5ML PO SYRP: 5.0000 mL | ORAL_SOLUTION | Freq: Four times a day (QID) | ORAL | Status: AC | PRN

## 2011-08-18 MED ORDER — HYDROCODONE-ACETAMINOPHEN 5-325 MG PO TABS: 1.0000 | ORAL_TABLET | Freq: Four times a day (QID) | ORAL | Status: DC | PRN

## 2011-08-18 MED ORDER — PREDNISONE 10 MG PO TABS: 10.0000 mg | ORAL_TABLET | Freq: Every day | ORAL | Status: DC

## 2011-08-18 NOTE — Patient Instructions (Signed)
Take all new medications as prescribed Continue all other medications as before  

## 2011-08-23 ENCOUNTER — Encounter: Payer: Self-pay | Admitting: Internal Medicine

## 2011-08-23 DIAGNOSIS — Z0000 Encounter for general adult medical examination without abnormal findings: Secondary | ICD-10-CM | POA: Insufficient documentation

## 2011-08-23 NOTE — Progress Notes (Signed)
Subjective:    Patient ID: Christy May, female    DOB: 12/28/1950, 61 y.o.   MRN: [id number]  HPI   Here with 3 days acute onset fever, facial pain, pressure, general weakness and malaise, and greenish d/c, with slight ST, but little to no cough and Pt denies chest pain, increased sob or doe, wheezing, orthopnea, PND, increased LE swelling, palpitations, dizziness or syncope, except for onset mild wheezing today with mild sob.  Pt denies new neurological symptoms such as new headache, or facial or extremity weakness or numbness   Pt denies polydipsia, polyuria.  Pt continues to have recurring right LBP and left leg pain without change in severity, bowel or bladder change, fever, wt loss,  worsening LE pain/numbness/weakness, gait change or falls, since initial fall jan 2013. Past Medical History  Diagnosis Date  . Anemia, unspecified   . Extrinsic asthma, unspecified   . Chronic airway obstruction, not elsewhere classified   . Spondylosis of unspecified site without mention of myelopathy   . Dehydration   . Depressive disorder, not elsewhere classified   . Type II or unspecified type diabetes mellitus without mention of complication, not stated as uncontrolled   . Esophageal reflux   . Unspecified essential hypertension   . Unspecified menopausal and postmenopausal disorder   . Obesity, unspecified   . Inflammatory and toxic neuropathy, unspecified   . Unspecified sleep apnea   . Unspecified vitamin D deficiency   . Dysphagia 2007    historyof dysphagia with severe dysmotility by barium Everardo All   Past Surgical History  Procedure Date  . Laser sugery     bilateral eyes  . Tonsillectomy   . Back surgery     fusion-multiple cervical spine levels-Dr. Lovell Sheehan  . Excision of abcess     ?? Chest  . Cholecystectomy   . Dilation and curettage of uterus   . Exploratory laparotomy   . Partial hysterectomy   . Total knee arthroplasty 2005    Left  . Total knee  arthroplasty 2005    Right- Dr. Turner Daniels  . Foot surgery     Right X 2    reports that she quit smoking about 25 years ago. She does not have any smokeless tobacco history on file. She reports that she does not drink alcohol or use illicit drugs. family history includes Colon polyps in her brother; Diabetes in her sister and unspecified family member; Esophageal cancer in her brother and sister; Heart disease in her maternal grandfather; and Pancreatic cancer in her sister. Allergies  Allergen Reactions  . Ace Inhibitors   . Penicillins    Current Outpatient Prescriptions on File Prior to Visit  Medication Sig Dispense Refill  . albuterol (PROVENTIL) (2.5 MG/3ML) 0.083% nebulizer solution Take 2.5 mg by nebulization every 6 (six) hours as needed.        Marland Kitchen aspirin 81 MG tablet Take 81 mg by mouth daily.        . cholecalciferol (VITAMIN D) 1000 UNITS tablet Take 2,000 Units by mouth daily.        . clindamycin (CLEOCIN) 150 MG capsule Take 150 mg by mouth as directed. Take 4 capsules 1 hour before dental procedures.        . Fluticasone-Salmeterol (ADVAIR DISKUS) 100-50 MCG/DOSE AEPB Inhale 1 puff into the lungs every 12 (twelve) hours.        . gabapentin (NEURONTIN) 600 MG tablet Take 1 tablet (600 mg total) by mouth 3 (three) times daily.  270 tablet  1  . HYDROcodone-acetaminophen (NORCO) 5-325 MG per tablet Take 1 tablet by mouth every 6 (six) hours as needed for pain.  60 tablet  0  . insulin glargine (LANTUS) 100 UNIT/ML injection Inject 250 Units into the skin daily. and syringes, 1 daily.  90 mL  5  . irbesartan (AVAPRO) 300 MG tablet Take 1 tablet (300 mg total) by mouth daily.  90 tablet  3  . KLOR-CON 10 10 MEQ CR tablet TAKE 1 TABLET BY MOUTH TWICE DAILY  180 tablet  3  . metoprolol (LOPRESSOR) 50 MG tablet Take 1.5 tablets (75 mg total) by mouth 2 (two) times daily.  270 tablet  3  . Multiple Vitamin (MULTIVITAMIN) tablet Take 1 tablet by mouth daily.        . traMADol  (ULTRAM-ER) 300 MG 24 hr tablet TAKE 1 TABLET BY MOUTH ONCE DAILY  90 tablet  1  . VESICARE 10 MG tablet TAKE 1 TABLET BY MOUTH DAILY  90 tablet  1  . cyclobenzaprine (FLEXERIL) 5 MG tablet Take 1 tablet (5 mg total) by mouth 3 (three) times daily as needed for muscle spasms.  60 tablet  1  . nitroGLYCERIN (NITRODUR - DOSED IN MG/24 HR) 0.2 mg/hr Place 0.25 patches onto the skin daily. To achilles tendon every day       . omeprazole (PRILOSEC) 20 MG capsule Take 1 capsule (20 mg total) by mouth 2 (two) times daily.  60 capsule  11  . DISCONTD: insulin glargine (LANTUS) 100 UNIT/ML injection 180 units each morning, and syringes, 1 daily.  60 mL  11  . DISCONTD: insulin glargine (LANTUS) 100 UNIT/ML injection Inject 200 Units into the skin daily. and syringes, 1 daily.  70 mL  2  . DISCONTD: insulin glargine (LANTUS) 100 UNIT/ML injection Inject 225 Units into the skin daily. and syringes, 1 daily.       Marland Kitchen DISCONTD: insulin glargine (LANTUS) 100 UNIT/ML injection Inject 225 Units into the skin daily. and syringes, 1 daily.  80 mL  11   Review of Systems Review of Systems  Constitutional: Negative for diaphoresis and unexpected weight change.  HENT: Negative for drooling and tinnitus.   Eyes: Negative for photophobia and visual disturbance.  Respiratory: Negative for choking and stridor.   Gastrointestinal: Negative for vomiting and blood in stool.  Genitourinary: Negative for hematuria and decreased urine volume.      Objective:   Physical Exam BP 132/78  Pulse 84  Temp(Src) 97.9 F (36.6 C) (Oral)  Ht 5\' 4"  (1.626 m)  Wt 307 lb 6.4 oz (139.436 kg)  BMI 52.77 kg/m2  SpO2 95% Physical Exam  VS noted, mild ill Constitutional: Pt appears well-developed and well-nourished.  HENT: Head: Normocephalic.  Right Ear: External ear normal.  Left Ear: External ear normal.  Bilat tm's mild erythema.  Sinus nontender.  Pharynx mild erythema Eyes: Conjunctivae and EOM are normal. Pupils are equal,  round, and reactive to light.  Neck: Normal range of motion. Neck supple.  Cardiovascular: Normal rate and regular rhythm.   Pulmonary/Chest: Effort normal and breath sounds mild decrease with mild wheeze bialt.  Spine nontender, mild right lumbar paravertebral tender Neurological: Pt is alert. No cranial nerve deficit. LE gait/motor/dtr intact Skin: Skin is warm. No erythema.  Psychiatric: Pt behavior is normal. Thought content normal.     Assessment & Plan:

## 2011-08-23 NOTE — Assessment & Plan Note (Signed)
Mild to mod, for predpack  course,  to f/u any worsening symptoms or concerns 

## 2011-08-23 NOTE — Assessment & Plan Note (Signed)
stable overall by hx and exam, most recent data reviewed with pt, and pt to continue medical treatment as before BP Readings from Last 3 Encounters:  08/18/11 132/78  07/02/11 122/64  06/24/11 128/67

## 2011-08-23 NOTE — Assessment & Plan Note (Signed)
Mild to mod, for antibx course,  to f/u any worsening symptoms or concerns 

## 2011-08-23 NOTE — Assessment & Plan Note (Addendum)
Mild recurrent,neuro exam stable,  Continue all other medications as before, . to f/u any worsening symptoms or concerns

## 2011-08-27 ENCOUNTER — Ambulatory Visit (HOSPITAL_COMMUNITY): Payer: Medicare Other

## 2011-08-27 ENCOUNTER — Ambulatory Visit (INDEPENDENT_AMBULATORY_CARE_PROVIDER_SITE_OTHER): Payer: Medicare Other | Admitting: Endocrinology

## 2011-08-27 ENCOUNTER — Encounter: Payer: Self-pay | Admitting: Endocrinology

## 2011-08-27 ENCOUNTER — Other Ambulatory Visit (INDEPENDENT_AMBULATORY_CARE_PROVIDER_SITE_OTHER): Payer: Medicare Other

## 2011-08-27 VITALS — BP 132/82 | HR 94 | Temp 98.1°F | Wt 311.0 lb

## 2011-08-27 DIAGNOSIS — E1149 Type 2 diabetes mellitus with other diabetic neurological complication: Secondary | ICD-10-CM

## 2011-08-27 DIAGNOSIS — E119 Type 2 diabetes mellitus without complications: Secondary | ICD-10-CM

## 2011-08-27 DIAGNOSIS — E1142 Type 2 diabetes mellitus with diabetic polyneuropathy: Secondary | ICD-10-CM

## 2011-08-27 DIAGNOSIS — E1049 Type 1 diabetes mellitus with other diabetic neurological complication: Secondary | ICD-10-CM

## 2011-08-27 DIAGNOSIS — E1065 Type 1 diabetes mellitus with hyperglycemia: Secondary | ICD-10-CM | POA: Diagnosis not present

## 2011-08-27 NOTE — Patient Instructions (Addendum)
good diet and exercise habits significanly improve the control of your diabetes.  please let me know if you wish to be referred to a dietician.  high blood sugar is very risky to your health.  you should see an eye doctor every year. controlling your blood pressure and cholesterol drastically reduces the damage diabetes does to your body.  this also applies to quitting smoking.  please discuss these with your doctor.  you should take an aspirin every day, unless you have been advised by a doctor not to. check your blood sugar 2 times a day.  vary the time of day when you check, between before the 3 meals, and at bedtime.  also check if you have symptoms of your blood sugar being too high or too low.  please keep a record of the readings and bring it to your next appointment here.  please call us sooner if your blood sugar goes below 70, or if it stays over 200. blood tests are being requested for you today.  please call [phone] to hear your test results.  You will be prompted to enter the 9-digit "MRN" number that appears at the top left of this page, followed by #.  Then you will hear the message. Please come back for a follow-up appointment in 6 weeks.   (update: i left message on phone-tree:  Ret with cbg record).

## 2011-08-27 NOTE — Progress Notes (Signed)
Subjective:    Patient ID: Christy May, female    DOB: [birth year], 61 y.o.   MRN: [id number]  HPI Pt returns for f/u of DM (approx 1985), characterized by insulin resistance.  she feels better in general.  She says she does not miss her insulin.  no cbg record, but states cbg's are mostly in the 200's.  She thinks it may go low approx once/week, but she is not sure (she did not check then).  She says DM control has been limited by intermittent steroid rx.   Past Medical History  Diagnosis Date  . Anemia, unspecified   . Extrinsic asthma, unspecified   . Chronic airway obstruction, not elsewhere classified   . Spondylosis of unspecified site without mention of myelopathy   . Dehydration   . Depressive disorder, not elsewhere classified   . Type II or unspecified type diabetes mellitus without mention of complication, not stated as uncontrolled   . Esophageal reflux   . Unspecified essential hypertension   . Unspecified menopausal and postmenopausal disorder   . Obesity, unspecified   . Inflammatory and toxic neuropathy, unspecified   . Unspecified sleep apnea   . Unspecified vitamin D deficiency   . Dysphagia 2007    historyof dysphagia with severe dysmotility by barium Everardo All    Past Surgical History  Procedure Date  . Laser sugery     bilateral eyes  . Tonsillectomy   . Back surgery     fusion-multiple cervical spine levels-Dr. Lovell Sheehan  . Excision of abcess     ?? Chest  . Cholecystectomy   . Dilation and curettage of uterus   . Exploratory laparotomy   . Partial hysterectomy   . Total knee arthroplasty 2005    Left  . Total knee arthroplasty 2005    Right- Dr. Turner Daniels  . Foot surgery     Right X 2    History   Social History  . Marital Status: Widowed    Spouse Name: N/A    Number of Children: N/A  . Years of Education: N/A   Occupational History  . [employer] (former)    Social History Main Topics  . Smoking status: Former Smoker    Quit date: 06/22/1986  . Smokeless tobacco: Not on file  . Alcohol Use: No  . Drug Use: No  . Sexually Active: Not on file   Other Topics Concern  . Not on file   Social History Narrative   Patient does not get regular exercise.Widowed 2004Disabled    Current Outpatient Prescriptions on File Prior to Visit  Medication Sig Dispense Refill  . albuterol (PROVENTIL) (2.5 MG/3ML) 0.083% nebulizer solution Take 2.5 mg by nebulization every 6 (six) hours as needed.        Marland Kitchen aspirin 81 MG tablet Take 81 mg by mouth daily.        . cholecalciferol (VITAMIN D) 1000 UNITS tablet Take 2,000 Units by mouth daily.        . clindamycin (CLEOCIN) 150 MG capsule Take 150 mg by mouth as directed. Take 4 capsules 1 hour before dental procedures.        . Fluticasone-Salmeterol (ADVAIR DISKUS) 100-50 MCG/DOSE AEPB Inhale 1 puff into the lungs every 12 (twelve) hours.        . gabapentin (NEURONTIN) 600 MG tablet Take 1 tablet (600 mg total) by mouth 3 (three) times daily.  270 tablet  1  . HYDROcodone-acetaminophen (NORCO) 5-325 MG per tablet Take 1 tablet  by mouth every 6 (six) hours as needed for pain.  60 tablet  0  . insulin glargine (LANTUS) 100 UNIT/ML injection Inject 250 Units into the skin daily. and syringes, 1 daily.  90 mL  5  . irbesartan (AVAPRO) 300 MG tablet Take 1 tablet (300 mg total) by mouth daily.  90 tablet  3  . KLOR-CON 10 10 MEQ CR tablet TAKE 1 TABLET BY MOUTH TWICE DAILY  180 tablet  3  . metoprolol (LOPRESSOR) 50 MG tablet Take 1.5 tablets (75 mg total) by mouth 2 (two) times daily.  270 tablet  3  . Multiple Vitamin (MULTIVITAMIN) tablet Take 1 tablet by mouth daily.        . nitroGLYCERIN (NITRODUR - DOSED IN MG/24 HR) 0.2 mg/hr Place 0.25 patches onto the skin daily. To achilles tendon every day       . omeprazole (PRILOSEC) 20 MG capsule Take 1 capsule (20 mg total) by mouth 2 (two) times daily.  60 capsule  11  . traMADol (ULTRAM-ER) 300 MG 24 hr tablet TAKE 1 TABLET BY  MOUTH ONCE DAILY  90 tablet  1  . VESICARE 10 MG tablet TAKE 1 TABLET BY MOUTH DAILY  90 tablet  1  . cyclobenzaprine (FLEXERIL) 5 MG tablet Take 1 tablet (5 mg total) by mouth 3 (three) times daily as needed for muscle spasms.  60 tablet  1  . DISCONTD: insulin glargine (LANTUS) 100 UNIT/ML injection 180 units each morning, and syringes, 1 daily.  60 mL  11  . DISCONTD: insulin glargine (LANTUS) 100 UNIT/ML injection Inject 200 Units into the skin daily. and syringes, 1 daily.  70 mL  2  . DISCONTD: insulin glargine (LANTUS) 100 UNIT/ML injection Inject 225 Units into the skin daily. and syringes, 1 daily.       Marland Kitchen DISCONTD: insulin glargine (LANTUS) 100 UNIT/ML injection Inject 225 Units into the skin daily. and syringes, 1 daily.  80 mL  11    Allergies  Allergen Reactions  . Ace Inhibitors   . Penicillins     Family History  Problem Relation Age of Onset  . Esophageal cancer Brother   . Esophageal cancer Sister     ?  . Colon polyps Brother   . Pancreatic cancer Sister     ?  . Diabetes Sister   . Diabetes      Aunt and Uncle  . Heart disease Maternal Grandfather     BP 132/82  Pulse 94  Temp(Src) 98.1 F (36.7 C) (Oral)  Wt 311 lb (141.069 kg)  SpO2 97%    Review of Systems denies loc.      Objective:   Physical Exam VITAL SIGNS:  See vs page GENERAL: no distress.  Obese Pulses: dorsalis pedis intact bilat.   Feet: no deformity.  no ulcer on the feet.  feet are of normal color and temp.  Trace bilat leg edema Neuro: sensation is intact to touch on the feet    Lab Results  Component Value Date   HGBA1C 8.4* 08/27/2011      Assessment & Plan:  DM.  The next step is to ret with cbg record, so insulin can be safely increased

## 2011-09-01 DIAGNOSIS — H04229 Epiphora due to insufficient drainage, unspecified lacrimal gland: Secondary | ICD-10-CM | POA: Diagnosis not present

## 2011-09-01 DIAGNOSIS — H04559 Acquired stenosis of unspecified nasolacrimal duct: Secondary | ICD-10-CM | POA: Diagnosis not present

## 2011-09-03 DIAGNOSIS — B351 Tinea unguium: Secondary | ICD-10-CM | POA: Diagnosis not present

## 2011-09-03 DIAGNOSIS — M79609 Pain in unspecified limb: Secondary | ICD-10-CM | POA: Diagnosis not present

## 2011-09-04 ENCOUNTER — Other Ambulatory Visit: Payer: Self-pay | Admitting: Internal Medicine

## 2011-09-10 ENCOUNTER — Other Ambulatory Visit: Payer: Self-pay

## 2011-09-10 MED ORDER — HYDROCODONE-ACETAMINOPHEN 5-325 MG PO TABS: 1.0000 | ORAL_TABLET | Freq: Four times a day (QID) | ORAL | Status: DC | PRN

## 2011-09-10 NOTE — Telephone Encounter (Signed)
Done hardcopy to robin  

## 2011-09-11 NOTE — Telephone Encounter (Signed)
Faxed hardcopy to pharmacy. 

## 2011-09-14 ENCOUNTER — Other Ambulatory Visit: Payer: Self-pay | Admitting: Internal Medicine

## 2011-09-16 ENCOUNTER — Encounter: Payer: Self-pay | Admitting: Endocrinology

## 2011-09-16 ENCOUNTER — Ambulatory Visit (INDEPENDENT_AMBULATORY_CARE_PROVIDER_SITE_OTHER): Payer: Medicare Other | Admitting: Endocrinology

## 2011-09-16 ENCOUNTER — Ambulatory Visit (INDEPENDENT_AMBULATORY_CARE_PROVIDER_SITE_OTHER)
Admission: RE | Admit: 2011-09-16 | Discharge: 2011-09-16 | Disposition: A | Discharge status: Home / Self Care | Payer: Medicare Other | Source: Ambulatory Visit | Attending: Endocrinology | Admitting: Endocrinology

## 2011-09-16 VITALS — BP 124/70 | HR 85 | Temp 98.0°F | Ht 64.0 in | Wt 306.0 lb

## 2011-09-16 DIAGNOSIS — R079 Chest pain, unspecified: Secondary | ICD-10-CM | POA: Diagnosis not present

## 2011-09-16 DIAGNOSIS — R0602 Shortness of breath: Secondary | ICD-10-CM | POA: Diagnosis not present

## 2011-09-16 DIAGNOSIS — J209 Acute bronchitis, unspecified: Secondary | ICD-10-CM | POA: Diagnosis not present

## 2011-09-16 DIAGNOSIS — R05 Cough: Secondary | ICD-10-CM | POA: Diagnosis not present

## 2011-09-16 DIAGNOSIS — E119 Type 2 diabetes mellitus without complications: Secondary | ICD-10-CM

## 2011-09-16 DIAGNOSIS — J984 Other disorders of lung: Secondary | ICD-10-CM | POA: Diagnosis not present

## 2011-09-16 MED ORDER — PROMETHAZINE-CODEINE 6.25-10 MG/5ML PO SYRP: 5.0000 mL | ORAL_SOLUTION | ORAL | Status: AC | PRN

## 2011-09-16 MED ORDER — INSULIN GLARGINE 100 UNIT/ML ~~LOC~~ SOLN: 275.0000 [IU] | Freq: Every day | SUBCUTANEOUS | Status: DC

## 2011-09-16 NOTE — Progress Notes (Signed)
Subjective:    Patient ID: Christy May, female    DOB: 1951-02-12, 61 y.o.   MRN: [id number]  HPI Pt states few weeks of dry-quality cough, and assoc pain.  She has myalgias, wheezing, and headache.  advair helps.   no cbg record, but states cbg's are well-controlled. Past Medical History  Diagnosis Date  . Anemia, unspecified   . Extrinsic asthma, unspecified   . Chronic airway obstruction, not elsewhere classified   . Spondylosis of unspecified site without mention of myelopathy   . Dehydration   . Depressive disorder, not elsewhere classified   . Type II or unspecified type diabetes mellitus without mention of complication, not stated as uncontrolled   . Esophageal reflux   . Unspecified essential hypertension   . Unspecified menopausal and postmenopausal disorder   . Obesity, unspecified   . Inflammatory and toxic neuropathy, unspecified   . Unspecified sleep apnea   . Unspecified vitamin D deficiency   . Dysphagia 2007    historyof dysphagia with severe dysmotility by barium Everardo All    Past Surgical History  Procedure Date  . Laser sugery     bilateral eyes  . Tonsillectomy   . Back surgery     fusion-multiple cervical spine levels-Dr. Lovell Sheehan  . Excision of abcess     ?? Chest  . Cholecystectomy   . Dilation and curettage of uterus   . Exploratory laparotomy   . Partial hysterectomy   . Total knee arthroplasty 2005    Left  . Total knee arthroplasty 2005    Right- Dr. Turner Daniels  . Foot surgery     Right X 2    History   Social History  . Marital Status: Widowed    Spouse Name: N/A    Number of Children: N/A  . Years of Education: N/A   Occupational History  . [employer] (former)    Social History Main Topics  . Smoking status: Former Smoker    Quit date: 06/22/1986  . Smokeless tobacco: Not on file  . Alcohol Use: No  . Drug Use: No  . Sexually Active: Not on file   Other Topics Concern  . Not on file   Social History  Narrative   Patient does not get regular exercise.Widowed 2004Disabled    Current Outpatient Prescriptions on File Prior to Visit  Medication Sig Dispense Refill  . albuterol (PROVENTIL) (2.5 MG/3ML) 0.083% nebulizer solution Take 2.5 mg by nebulization every 6 (six) hours as needed.        Marland Kitchen aspirin 81 MG tablet Take 81 mg by mouth daily.        . cholecalciferol (VITAMIN D) 1000 UNITS tablet Take 2,000 Units by mouth daily.        . clindamycin (CLEOCIN) 150 MG capsule Take 150 mg by mouth as directed. Take 4 capsules 1 hour before dental procedures.        . Fluticasone-Salmeterol (ADVAIR DISKUS) 100-50 MCG/DOSE AEPB Inhale 1 puff into the lungs every 12 (twelve) hours.        . gabapentin (NEURONTIN) 600 MG tablet TAKE ONE (1) TABLET(S) THREE (3) TIMES DAILY  90 tablet  5  . HYDROcodone-acetaminophen (NORCO) 5-325 MG per tablet Take 1 tablet by mouth every 6 (six) hours as needed for pain.  60 tablet  1  . irbesartan (AVAPRO) 300 MG tablet Take 1 tablet (300 mg total) by mouth daily.  90 tablet  3  . KLOR-CON 10 10 MEQ CR tablet TAKE  1 TABLET BY MOUTH TWICE DAILY  180 tablet  3  . metoprolol (LOPRESSOR) 50 MG tablet TAKE 1 1/2 TABLETS BY MOUTH TWICE DAILY  270 tablet  3  . omeprazole (PRILOSEC) 20 MG capsule Take 1 capsule (20 mg total) by mouth 2 (two) times daily.  60 capsule  11  . traMADol (ULTRAM-ER) 300 MG 24 hr tablet TAKE 1 TABLET BY MOUTH ONCE DAILY  90 tablet  1  . VESICARE 10 MG tablet TAKE 1 TABLET BY MOUTH DAILY  90 tablet  1  . cyclobenzaprine (FLEXERIL) 5 MG tablet Take 1 tablet (5 mg total) by mouth 3 (three) times daily as needed for muscle spasms.  60 tablet  1  . nitroGLYCERIN (NITRODUR - DOSED IN MG/24 HR) 0.2 mg/hr Place 0.25 patches onto the skin daily. To achilles tendon every day       . DISCONTD: insulin glargine (LANTUS) 100 UNIT/ML injection 180 units each morning, and syringes, 1 daily.  60 mL  11  . DISCONTD: insulin glargine (LANTUS) 100 UNIT/ML injection  Inject 200 Units into the skin daily. and syringes, 1 daily.  70 mL  2  . DISCONTD: insulin glargine (LANTUS) 100 UNIT/ML injection Inject 225 Units into the skin daily. and syringes, 1 daily.       Marland Kitchen DISCONTD: insulin glargine (LANTUS) 100 UNIT/ML injection Inject 225 Units into the skin daily. and syringes, 1 daily.  80 mL  11    Allergies  Allergen Reactions  . Ace Inhibitors   . Penicillins     Family History  Problem Relation Age of Onset  . Esophageal cancer Brother   . Esophageal cancer Sister     ?  . Colon polyps Brother   . Pancreatic cancer Sister     ?  . Diabetes Sister   . Diabetes      Aunt and Uncle  . Heart disease Maternal Grandfather     BP 124/70  Pulse 85  Temp(Src) 98 F (36.7 C) (Oral)  Ht 5\' 4"  (1.626 m)  Wt 306 lb (138.801 kg)  BMI 52.52 kg/m2  SpO2 95%    Review of Systems She has rhinorrhea.  She feels as though she has had hypoglycemia, but she is not sure.  No fever    Objective:   Physical Exam VITAL SIGNS:  See vs page GENERAL: no distress head: no deformity eyes: no periorbital swelling, no proptosis external nose and ears are normal mouth: no lesion seen Both eac's and tm's are normal LUNGS:  Clear to auscultation  Lab Results  Component Value Date   HGBA1C 8.4* 08/27/2011  cxr: nad    Assessment & Plan:  Acute bronchitis, new Dm, needs increased rx

## 2011-09-16 NOTE — Patient Instructions (Addendum)
A chest-x-ray is requested for you today.  You will receive a letter with results. Here is a prescription for cough syrup.   Continue advair. increase lantus to 275 units daily. If you feel as though your sugar is low, check it.  Call if below 80.   I hope you feel better soon.  If you don't feel better by next week, please call back. (see letter)

## 2011-09-17 ENCOUNTER — Telehealth: Payer: Self-pay | Admitting: *Deleted

## 2011-09-17 NOTE — Telephone Encounter (Signed)
Called pt to inform of chest xray results. Pt informed of results (Letter also mailed to pt).

## 2011-10-08 ENCOUNTER — Encounter: Payer: Self-pay | Admitting: Endocrinology

## 2011-10-08 ENCOUNTER — Ambulatory Visit (INDEPENDENT_AMBULATORY_CARE_PROVIDER_SITE_OTHER): Payer: Medicare Other | Admitting: Endocrinology

## 2011-10-08 VITALS — BP 130/72 | HR 90 | Temp 98.2°F | Ht 65.0 in | Wt 312.1 lb

## 2011-10-08 DIAGNOSIS — E1049 Type 1 diabetes mellitus with other diabetic neurological complication: Secondary | ICD-10-CM

## 2011-10-08 DIAGNOSIS — E1142 Type 2 diabetes mellitus with diabetic polyneuropathy: Secondary | ICD-10-CM

## 2011-10-08 DIAGNOSIS — E1065 Type 1 diabetes mellitus with hyperglycemia: Secondary | ICD-10-CM

## 2011-10-08 DIAGNOSIS — E1149 Type 2 diabetes mellitus with other diabetic neurological complication: Secondary | ICD-10-CM

## 2011-10-08 NOTE — Progress Notes (Signed)
Subjective:    Patient ID: Christy May, female    DOB: 1950/07/31, 61 y.o.   MRN: [id number]  HPI Pt returns for f/u of DM (dx'ed approx 1985--complicated by peripheral sensory neuropathy), characterized by insulin resistance.  She feels much better since her recent illness.  no cbg record, and she knows little if any about how cbg's are doing.  She is having trouble remembering to take insulin.   Past Medical History  Diagnosis Date  . Anemia, unspecified   . Extrinsic asthma, unspecified   . Chronic airway obstruction, not elsewhere classified   . Spondylosis of unspecified site without mention of myelopathy   . Dehydration   . Depressive disorder, not elsewhere classified   . Type II or unspecified type diabetes mellitus without mention of complication, not stated as uncontrolled   . Esophageal reflux   . Unspecified essential hypertension   . Unspecified menopausal and postmenopausal disorder   . Obesity, unspecified   . Inflammatory and toxic neuropathy, unspecified   . Unspecified sleep apnea   . Unspecified vitamin D deficiency   . Dysphagia 2007    historyof dysphagia with severe dysmotility by barium Everardo All    Past Surgical History  Procedure Date  . Laser sugery     bilateral eyes  . Tonsillectomy   . Back surgery     fusion-multiple cervical spine levels-Dr. Lovell Sheehan  . Excision of abcess     ?? Chest  . Cholecystectomy   . Dilation and curettage of uterus   . Exploratory laparotomy   . Partial hysterectomy   . Total knee arthroplasty 2005    Left  . Total knee arthroplasty 2005    Right- Dr. Turner Daniels  . Foot surgery     Right X 2    History   Social History  . Marital Status: Widowed    Spouse Name: N/A    Number of Children: N/A  . Years of Education: N/A   Occupational History  . [employer] (former)    Social History Main Topics  . Smoking status: Former Smoker    Quit date: 06/22/1986  . Smokeless tobacco: Not on file    . Alcohol Use: No  . Drug Use: No  . Sexually Active: Not on file   Other Topics Concern  . Not on file   Social History Narrative   Patient does not get regular exercise.Widowed 2004Disabled    Current Outpatient Prescriptions on File Prior to Visit  Medication Sig Dispense Refill  . albuterol (PROVENTIL) (2.5 MG/3ML) 0.083% nebulizer solution Take 2.5 mg by nebulization every 6 (six) hours as needed.        Marland Kitchen aspirin 81 MG tablet Take 81 mg by mouth daily.        . cholecalciferol (VITAMIN D) 1000 UNITS tablet Take 2,000 Units by mouth daily.        . clindamycin (CLEOCIN) 150 MG capsule Take 150 mg by mouth as directed. Take 4 capsules 1 hour before dental procedures.        . Fluticasone-Salmeterol (ADVAIR DISKUS) 100-50 MCG/DOSE AEPB Inhale 1 puff into the lungs every 12 (twelve) hours.        . gabapentin (NEURONTIN) 600 MG tablet TAKE ONE (1) TABLET(S) THREE (3) TIMES DAILY  90 tablet  5  . HYDROcodone-acetaminophen (NORCO) 5-325 MG per tablet Take 1 tablet by mouth every 6 (six) hours as needed for pain.  60 tablet  1  . insulin glargine (LANTUS) 100 UNIT/ML  injection Inject 275 Units into the skin daily. and syringes, 1 daily.  100 mL  11  . irbesartan (AVAPRO) 300 MG tablet Take 1 tablet (300 mg total) by mouth daily.  90 tablet  3  . KLOR-CON 10 10 MEQ CR tablet TAKE 1 TABLET BY MOUTH TWICE DAILY  180 tablet  3  . metoprolol (LOPRESSOR) 50 MG tablet TAKE 1 1/2 TABLETS BY MOUTH TWICE DAILY  270 tablet  3  . omeprazole (PRILOSEC) 20 MG capsule Take 1 capsule (20 mg total) by mouth 2 (two) times daily.  60 capsule  11  . traMADol (ULTRAM-ER) 300 MG 24 hr tablet TAKE 1 TABLET BY MOUTH ONCE DAILY  90 tablet  1  . VESICARE 10 MG tablet TAKE 1 TABLET BY MOUTH DAILY  90 tablet  1  . DISCONTD: insulin glargine (LANTUS) 100 UNIT/ML injection 180 units each morning, and syringes, 1 daily.  60 mL  11  . DISCONTD: insulin glargine (LANTUS) 100 UNIT/ML injection Inject 200 Units into the  skin daily. and syringes, 1 daily.  70 mL  2  . DISCONTD: insulin glargine (LANTUS) 100 UNIT/ML injection Inject 225 Units into the skin daily. and syringes, 1 daily.       Marland Kitchen DISCONTD: insulin glargine (LANTUS) 100 UNIT/ML injection Inject 225 Units into the skin daily. and syringes, 1 daily.  80 mL  11    Allergies  Allergen Reactions  . Ace Inhibitors   . Penicillins     Family History  Problem Relation Age of Onset  . Esophageal cancer Brother   . Esophageal cancer Sister     ?  . Colon polyps Brother   . Pancreatic cancer Sister     ?  . Diabetes Sister   . Diabetes      Aunt and Uncle  . Heart disease Maternal Grandfather     BP 130/72  Pulse 90  Temp(Src) 98.2 F (36.8 C) (Oral)  Ht 5\' 5"  (1.651 m)  Wt 312 lb 1.9 oz (141.577 kg)  BMI 51.94 kg/m2  SpO2 95%   Review of Systems Denies loc    Objective:   Physical Exam VITAL SIGNS:  See vs page GENERAL: no distress SKIN:  Insulin injection sites at the anterior abdomen are normal.  Lab Results  Component Value Date   HGBA1C 8.4* 08/27/2011      Assessment & Plan:

## 2011-10-08 NOTE — Patient Instructions (Addendum)
Please continue the same lantus Please come back for a follow-up appointment in 2 months.   check your blood sugar 2 times a day.  vary the time of day when you check, between before the 3 meals, and at bedtime.  also check if you have symptoms of your blood sugar being too high or too low.  please keep a record of the readings and bring it to your next appointment here.  please call us sooner if your blood sugar goes below 70, or if it stays over 200.

## 2011-10-10 NOTE — Assessment & Plan Note (Signed)
therapy limited by pt's need for a simple regimen

## 2011-10-12 ENCOUNTER — Ambulatory Visit: Payer: Medicare Other | Admitting: Internal Medicine

## 2011-10-13 ENCOUNTER — Other Ambulatory Visit (INDEPENDENT_AMBULATORY_CARE_PROVIDER_SITE_OTHER): Payer: Medicare Other

## 2011-10-13 ENCOUNTER — Encounter: Payer: Self-pay | Admitting: Internal Medicine

## 2011-10-13 ENCOUNTER — Ambulatory Visit (INDEPENDENT_AMBULATORY_CARE_PROVIDER_SITE_OTHER): Payer: Medicare Other | Admitting: Internal Medicine

## 2011-10-13 VITALS — BP 102/62 | HR 78 | Temp 97.5°F | Ht 64.0 in | Wt 310.4 lb

## 2011-10-13 DIAGNOSIS — R350 Frequency of micturition: Secondary | ICD-10-CM

## 2011-10-13 DIAGNOSIS — J449 Chronic obstructive pulmonary disease, unspecified: Secondary | ICD-10-CM | POA: Diagnosis not present

## 2011-10-13 DIAGNOSIS — F329 Major depressive disorder, single episode, unspecified: Secondary | ICD-10-CM | POA: Diagnosis not present

## 2011-10-13 DIAGNOSIS — I10 Essential (primary) hypertension: Secondary | ICD-10-CM

## 2011-10-13 LAB — URINALYSIS, ROUTINE W REFLEX MICROSCOPIC
Hgb urine dipstick: NEGATIVE
Ketones, ur: NEGATIVE
Total Protein, Urine: NEGATIVE
Urine Glucose: NEGATIVE

## 2011-10-13 MED ORDER — CIPROFLOXACIN HCL 500 MG PO TABS: 500.0000 mg | ORAL_TABLET | Freq: Two times a day (BID) | ORAL | Status: AC

## 2011-10-13 NOTE — Progress Notes (Signed)
Subjective:    Patient ID: Christy May, female    DOB: August 31, 1950, 61 y.o.   MRN: [id number]  HPI   Here to f/u with 3 day onset urinary freq, with worsening incontinence much more than usual, wears depends during day, but now at night as well;   Denies urinary symptoms such as dysuria,  urgency,or hematuria, also no high fever, chills, back pain or n/v, abd pain, but has general weakness and headache as well.  Pt denies chest pain, increased sob or doe, wheezing, orthopnea, PND, increased LE swelling, palpitations, dizziness or syncope.  Pt denies new neurological symptoms such as new headache, or facial or extremity weakness or numbness.  Denies worsening depressive symptoms, suicidal ideation, or panic, though has ongoing anxiety, and Brother died yesterday s/p MI, taken off ventilator.  Overall thinks she is coping ok. Past Medical History  Diagnosis Date  . Anemia, unspecified   . Extrinsic asthma, unspecified   . Chronic airway obstruction, not elsewhere classified   . Spondylosis of unspecified site without mention of myelopathy   . Dehydration   . Depressive disorder, not elsewhere classified   . Type II or unspecified type diabetes mellitus without mention of complication, not stated as uncontrolled   . Esophageal reflux   . Unspecified essential hypertension   . Unspecified menopausal and postmenopausal disorder   . Obesity, unspecified   . Inflammatory and toxic neuropathy, unspecified   . Unspecified sleep apnea   . Unspecified vitamin D deficiency   . Dysphagia 2007    historyof dysphagia with severe dysmotility by barium Everardo All   Past Surgical History  Procedure Date  . Laser sugery     bilateral eyes  . Tonsillectomy   . Back surgery     fusion-multiple cervical spine levels-Dr. Lovell Sheehan  . Excision of abcess     ?? Chest  . Cholecystectomy   . Dilation and curettage of uterus   . Exploratory laparotomy   . Partial hysterectomy   . Total knee  arthroplasty 2005    Left  . Total knee arthroplasty 2005    Right- Dr. Turner Daniels  . Foot surgery     Right X 2    reports that she quit smoking about 25 years ago. She does not have any smokeless tobacco history on file. She reports that she does not drink alcohol or use illicit drugs. family history includes Colon polyps in her brother; Diabetes in her sister and unspecified family member; Esophageal cancer in her brother and sister; Heart disease in her maternal grandfather; and Pancreatic cancer in her sister. Allergies  Allergen Reactions  . Ace Inhibitors   . Penicillins    Current Outpatient Prescriptions on File Prior to Visit  Medication Sig Dispense Refill  . albuterol (PROVENTIL) (2.5 MG/3ML) 0.083% nebulizer solution Take 2.5 mg by nebulization every 6 (six) hours as needed.        Marland Kitchen aspirin 81 MG tablet Take 81 mg by mouth daily.        . cholecalciferol (VITAMIN D) 1000 UNITS tablet Take 2,000 Units by mouth daily.        . clindamycin (CLEOCIN) 150 MG capsule Take 150 mg by mouth as directed. Take 4 capsules 1 hour before dental procedures.        . Fluticasone-Salmeterol (ADVAIR DISKUS) 100-50 MCG/DOSE AEPB Inhale 1 puff into the lungs every 12 (twelve) hours.        . gabapentin (NEURONTIN) 600 MG tablet TAKE ONE (1) TABLET(S)  THREE (3) TIMES DAILY  90 tablet  5  . HYDROcodone-acetaminophen (NORCO) 5-325 MG per tablet Take 1 tablet by mouth every 6 (six) hours as needed for pain.  60 tablet  1  . insulin glargine (LANTUS) 100 UNIT/ML injection Inject 275 Units into the skin daily. and syringes, 1 daily.  100 mL  11  . irbesartan (AVAPRO) 300 MG tablet Take 1 tablet (300 mg total) by mouth daily.  90 tablet  3  . KLOR-CON 10 10 MEQ CR tablet TAKE 1 TABLET BY MOUTH TWICE DAILY  180 tablet  3  . metoprolol (LOPRESSOR) 50 MG tablet TAKE 1 1/2 TABLETS BY MOUTH TWICE DAILY  270 tablet  3  . omeprazole (PRILOSEC) 20 MG capsule Take 1 capsule (20 mg total) by mouth 2 (two) times  daily.  60 capsule  11  . traMADol (ULTRAM-ER) 300 MG 24 hr tablet TAKE 1 TABLET BY MOUTH ONCE DAILY  90 tablet  1  . VESICARE 10 MG tablet TAKE 1 TABLET BY MOUTH DAILY  90 tablet  1  . DISCONTD: insulin glargine (LANTUS) 100 UNIT/ML injection 180 units each morning, and syringes, 1 daily.  60 mL  11  . DISCONTD: insulin glargine (LANTUS) 100 UNIT/ML injection Inject 200 Units into the skin daily. and syringes, 1 daily.  70 mL  2  . DISCONTD: insulin glargine (LANTUS) 100 UNIT/ML injection Inject 225 Units into the skin daily. and syringes, 1 daily.       Marland Kitchen DISCONTD: insulin glargine (LANTUS) 100 UNIT/ML injection Inject 225 Units into the skin daily. and syringes, 1 daily.  80 mL  11   Review of Systems Constitutional: Negative for diaphoresis and unexpected weight change.  Eyes: Negative for photophobia and visual disturbance.  Respiratory: Negative for choking and stridor.   Gastrointestinal: Negative for vomiting and blood in stool.  Genitourinary: Negative for hematuria and decreased urine volume.  Musculoskeletal: Negative for gait problem.  Skin: Negative for color change and wound.  Neurological: Negative for tremors and numbness.  Psychiatric/Behavioral: Negative for decreased concentration, irritable    Objective:   Physical Exam BP 102/62  Pulse 78  Temp(Src) 97.5 F (36.4 C) (Oral)  Ht 5\' 4"  (1.626 m)  Wt 310 lb 6 oz (140.785 kg)  BMI 53.28 kg/m2  SpO2 97% Physical Exam  VS noted, fatigued appearing Constitutional: Pt appears well-developed and well-nourished. /morbid obese HENT: Head: Normocephalic.  Right Ear: External ear normal.  Left Ear: External ear normal.  Eyes: Conjunctivae and EOM are normal. Pupils are equal, round, and reactive to light.  Neck: Normal range of motion. Neck supple.  Cardiovascular: Normal rate and regular rhythm.   Pulmonary/Chest: Effort normal and breath sounds normal.  Abd:  Soft, NT, non-distended, + BS except for mild lower mid abd  tender, without guarding or rebound No flank tender or rebound Neurological: Pt is alert. Motor/gait intact Skin: Skin is warm. No erythema.  Psychiatric: Pt behavior is normal. Thought content normal. Not overly depressed or nervous appearing today    Assessment & Plan:

## 2011-10-13 NOTE — Assessment & Plan Note (Signed)
stable overall by hx and exam, most recent data reviewed with pt, and pt to continue medical treatment as before  Lab Results  Component Value Date   WBC 6.0 11/21/2010   HGB 12.2 11/21/2010   HCT 37.4 11/21/2010   PLT 196.0 11/21/2010   GLUCOSE 306* 11/21/2010   CHOL 180 11/21/2010   TRIG 108.0 11/21/2010   HDL 50.10 11/21/2010   LDLCALC 108* 11/21/2010   ALT 23 11/21/2010   AST 18 11/21/2010   NA 138 11/21/2010   K 4.7 11/21/2010   CL 103 11/21/2010   CREATININE 0.9 11/21/2010   BUN 22 11/21/2010   CO2 28 11/21/2010   TSH 1.66 11/21/2010   HGBA1C 8.4* 08/27/2011   MICROALBUR 2.3* 11/21/2010

## 2011-10-13 NOTE — Assessment & Plan Note (Signed)
stable overall by hx and exam, most recent data reviewed with pt, and pt to continue medical treatment as before le  SpO2 Readings from Last 3 Encounters:  10/13/11 97%  10/08/11 95%  09/16/11 95%

## 2011-10-13 NOTE — Patient Instructions (Addendum)
Take all new medications as prescribed Continue all other medications as before Please have the pharmacy call with any refills you may need. Please keep your appointments with your specialists as you have planned - Dr Everardo All for the diabetes Please go to LAB in the Basement for the urine tests to be done today You will be contacted by phone if any changes need to be made immediately.  Otherwise, you will receive a letter about your results with an explanation. I think we can cancel the May 29 appt with me

## 2011-10-13 NOTE — Assessment & Plan Note (Signed)
Suspect UTI with acute worsening with worsening incont x 3 days, without back pain, f/c   - for urinary studies,and Mild to mod, for antibx course,  to f/u any worsening symptoms or concerns

## 2011-10-13 NOTE — Assessment & Plan Note (Signed)
stable overall by hx and exam, most recent data reviewed with pt, and pt to continue medical treatment as before  BP Readings from Last 3 Encounters:  10/13/11 102/62  10/08/11 130/72  09/16/11 124/70

## 2011-10-15 LAB — URINE CULTURE: Colony Count: 15000

## 2011-10-22 ENCOUNTER — Ambulatory Visit: Payer: Medicare Other | Admitting: *Deleted

## 2011-10-23 ENCOUNTER — Encounter: Payer: Self-pay | Admitting: *Deleted

## 2011-10-23 ENCOUNTER — Other Ambulatory Visit: Payer: Self-pay

## 2011-10-23 MED ORDER — HYDROCODONE-ACETAMINOPHEN 5-325 MG PO TABS: 1.0000 | ORAL_TABLET | Freq: Four times a day (QID) | ORAL | Status: DC | PRN

## 2011-10-23 NOTE — Telephone Encounter (Signed)
Done hardcopy to robin  

## 2011-10-23 NOTE — Telephone Encounter (Signed)
Faxed hardcopy to pharmacy. 

## 2011-11-18 ENCOUNTER — Ambulatory Visit: Payer: Medicare Other | Admitting: Internal Medicine

## 2011-12-03 DIAGNOSIS — E1149 Type 2 diabetes mellitus with other diabetic neurological complication: Secondary | ICD-10-CM | POA: Diagnosis not present

## 2011-12-03 DIAGNOSIS — M2040 Other hammer toe(s) (acquired), unspecified foot: Secondary | ICD-10-CM | POA: Diagnosis not present

## 2011-12-03 DIAGNOSIS — Q828 Other specified congenital malformations of skin: Secondary | ICD-10-CM | POA: Diagnosis not present

## 2011-12-03 DIAGNOSIS — M779 Enthesopathy, unspecified: Secondary | ICD-10-CM | POA: Diagnosis not present

## 2011-12-04 ENCOUNTER — Other Ambulatory Visit: Payer: Self-pay | Admitting: Internal Medicine

## 2011-12-04 MED ORDER — HYDROCODONE-ACETAMINOPHEN 5-325 MG PO TABS: 1.0000 | ORAL_TABLET | Freq: Four times a day (QID) | ORAL | Status: DC | PRN

## 2011-12-04 NOTE — Telephone Encounter (Signed)
Please call pt for refill of Hydrocodone.  Pt advises she called [facility], they advised her to call office.

## 2011-12-04 NOTE — Telephone Encounter (Signed)
Done hardcopy to robin  

## 2011-12-04 NOTE — Telephone Encounter (Signed)
Faxed hardcopy to pharmacy. 

## 2011-12-14 ENCOUNTER — Ambulatory Visit (INDEPENDENT_AMBULATORY_CARE_PROVIDER_SITE_OTHER): Payer: Medicare Other | Admitting: Endocrinology

## 2011-12-14 ENCOUNTER — Other Ambulatory Visit (INDEPENDENT_AMBULATORY_CARE_PROVIDER_SITE_OTHER): Payer: Medicare Other

## 2011-12-14 ENCOUNTER — Encounter: Payer: Self-pay | Admitting: Endocrinology

## 2011-12-14 VITALS — BP 124/62 | HR 84 | Temp 97.9°F | Wt 308.0 lb

## 2011-12-14 DIAGNOSIS — E1142 Type 2 diabetes mellitus with diabetic polyneuropathy: Secondary | ICD-10-CM | POA: Diagnosis not present

## 2011-12-14 DIAGNOSIS — E1149 Type 2 diabetes mellitus with other diabetic neurological complication: Secondary | ICD-10-CM | POA: Diagnosis not present

## 2011-12-14 DIAGNOSIS — R109 Unspecified abdominal pain: Secondary | ICD-10-CM

## 2011-12-14 DIAGNOSIS — E1049 Type 1 diabetes mellitus with other diabetic neurological complication: Secondary | ICD-10-CM

## 2011-12-14 DIAGNOSIS — R1030 Lower abdominal pain, unspecified: Secondary | ICD-10-CM | POA: Insufficient documentation

## 2011-12-14 DIAGNOSIS — E1065 Type 1 diabetes mellitus with hyperglycemia: Secondary | ICD-10-CM

## 2011-12-14 NOTE — Progress Notes (Signed)
Subjective:    Patient ID: Christy May, female    DOB: [birth year], 61 y.o.   MRN: [id number]  HPI Pt returns for f/u of DM (dx'ed approx 1985--complicated by peripheral sensory neuropathy), characterized by insulin resistance.  She feels much better since her recent illness.  no cbg record, and she knows little if any about how cbg's are doing.  She takes insulin qd, as rx'ed.  no cbg record, but states cbg's are well-controlled.  She seldom has hypoglycemia, and these episode are mild. Pt states 2 weeks of intermittent moderate pain across the right side of the abdomen, and assoc nausea.   Past Medical History  Diagnosis Date  . Anemia, unspecified   . Extrinsic asthma, unspecified   . Chronic airway obstruction, not elsewhere classified   . Spondylosis of unspecified site without mention of myelopathy   . Dehydration   . Depressive disorder, not elsewhere classified   . Type II or unspecified type diabetes mellitus without mention of complication, not stated as uncontrolled   . Esophageal reflux   . Unspecified essential hypertension   . Unspecified menopausal and postmenopausal disorder   . Obesity, unspecified   . Inflammatory and toxic neuropathy, unspecified   . Unspecified sleep apnea   . Unspecified vitamin D deficiency   . Dysphagia 2007    historyof dysphagia with severe dysmotility by barium Everardo All    Past Surgical History  Procedure Date  . Laser sugery     bilateral eyes  . Tonsillectomy   . Back surgery     fusion-multiple cervical spine levels-Dr. Lovell Sheehan  . Excision of abcess     ?? Chest  . Cholecystectomy   . Dilation and curettage of uterus   . Exploratory laparotomy   . Partial hysterectomy   . Total knee arthroplasty 2005    Left  . Total knee arthroplasty 2005    Right- Dr. Turner Daniels  . Foot surgery     Right X 2    History   Social History  . Marital Status: Widowed    Spouse Name: N/A    Number of Children: N/A  . Years of  Education: N/A   Occupational History  . [employer] (former)    Social History Main Topics  . Smoking status: Former Smoker    Quit date: 06/22/1986  . Smokeless tobacco: Not on file  . Alcohol Use: No  . Drug Use: No  . Sexually Active: Not on file   Other Topics Concern  . Not on file   Social History Narrative   Patient does not get regular exercise.Widowed 2004Disabled    Current Outpatient Prescriptions on File Prior to Visit  Medication Sig Dispense Refill  . albuterol (PROVENTIL) (2.5 MG/3ML) 0.083% nebulizer solution Take 2.5 mg by nebulization every 6 (six) hours as needed.        Marland Kitchen aspirin 81 MG tablet Take 81 mg by mouth daily.        . cholecalciferol (VITAMIN D) 1000 UNITS tablet Take 2,000 Units by mouth daily.        . Fluticasone-Salmeterol (ADVAIR DISKUS) 100-50 MCG/DOSE AEPB Inhale 1 puff into the lungs every 12 (twelve) hours.        . gabapentin (NEURONTIN) 600 MG tablet TAKE ONE (1) TABLET(S) THREE (3) TIMES DAILY  90 tablet  5  . HYDROcodone-acetaminophen (NORCO) 5-325 MG per tablet Take 1 tablet by mouth every 6 (six) hours as needed for pain.  60 tablet  2  .  insulin glargine (LANTUS) 100 UNIT/ML injection Inject 275 Units into the skin daily. and syringes, 1 daily.  100 mL  11  . KLOR-CON 10 10 MEQ CR tablet TAKE 1 TABLET BY MOUTH TWICE DAILY  180 tablet  3  . metoprolol (LOPRESSOR) 50 MG tablet TAKE 1 1/2 TABLETS BY MOUTH TWICE DAILY  270 tablet  3  . omeprazole (PRILOSEC) 20 MG capsule Take 1 capsule (20 mg total) by mouth 2 (two) times daily.  60 capsule  11  . traMADol (ULTRAM-ER) 300 MG 24 hr tablet TAKE 1 TABLET BY MOUTH ONCE DAILY  90 tablet  1  . VESICARE 10 MG tablet TAKE 1 TABLET BY MOUTH DAILY  90 tablet  1  . clindamycin (CLEOCIN) 150 MG capsule Take 150 mg by mouth as directed. Take 4 capsules 1 hour before dental procedures.        . irbesartan (AVAPRO) 300 MG tablet Take 1 tablet (300 mg total) by mouth daily.  90 tablet  3  .  DISCONTD: insulin glargine (LANTUS) 100 UNIT/ML injection 180 units each morning, and syringes, 1 daily.  60 mL  11  . DISCONTD: insulin glargine (LANTUS) 100 UNIT/ML injection Inject 200 Units into the skin daily. and syringes, 1 daily.  70 mL  2  . DISCONTD: insulin glargine (LANTUS) 100 UNIT/ML injection Inject 225 Units into the skin daily. and syringes, 1 daily.       Marland Kitchen DISCONTD: insulin glargine (LANTUS) 100 UNIT/ML injection Inject 225 Units into the skin daily. and syringes, 1 daily.  80 mL  11    Allergies  Allergen Reactions  . Ace Inhibitors   . Penicillins     Family History  Problem Relation Age of Onset  . Esophageal cancer Brother   . Esophageal cancer Sister     ?  . Colon polyps Brother   . Pancreatic cancer Sister     ?  . Diabetes Sister   . Diabetes      Aunt and Uncle  . Heart disease Maternal Grandfather     BP 124/62  Pulse 84  Temp 97.9 F (36.6 C) (Oral)  Wt 308 lb (139.708 kg)  SpO2 96%    Review of Systems Denies vomiting and diarrhea    Objective:   Physical Exam VITAL SIGNS:  See vs page GENERAL: no distress ABDOMEN: abdomen is soft, nontender.  no hepatosplenomegaly.  not distended.  no hernia   Lab Results  Component Value Date   HGBA1C 8.1* 12/14/2011      Assessment & Plan:  DM.  needs increased rx abd pain, new.  uncertain etiology

## 2011-12-14 NOTE — Patient Instructions (Addendum)
Please continue the same lantus Please come back for a follow-up appointment in 3 months.   check your blood sugar 2 times a day.  vary the time of day when you check, between before the 3 meals, and at bedtime.  also check if you have symptoms of your blood sugar being too high or too low.  please keep a record of the readings and bring it to your next appointment here.  please call us sooner if your blood sugar goes below 70, or if it stays over 200. blood tests and an ultrasound are being requested for you today.  You will receive a letter with results. I hope you feel better soon.  If you don't feel better in a few days, please call dr Jonny Ruiz.

## 2011-12-15 ENCOUNTER — Other Ambulatory Visit: Payer: Self-pay | Admitting: Internal Medicine

## 2011-12-15 ENCOUNTER — Ambulatory Visit
Admission: RE | Admit: 2011-12-15 | Discharge: 2011-12-15 | Disposition: A | Discharge status: Home / Self Care | Payer: Medicare Other | Source: Ambulatory Visit | Attending: Endocrinology | Admitting: Endocrinology

## 2011-12-15 DIAGNOSIS — R109 Unspecified abdominal pain: Secondary | ICD-10-CM

## 2011-12-15 DIAGNOSIS — K7689 Other specified diseases of liver: Secondary | ICD-10-CM | POA: Diagnosis not present

## 2011-12-15 DIAGNOSIS — R197 Diarrhea, unspecified: Secondary | ICD-10-CM | POA: Diagnosis not present

## 2011-12-15 LAB — URINALYSIS, ROUTINE W REFLEX MICROSCOPIC
Specific Gravity, Urine: 1.025 (ref 1.000–1.030)
Urine Glucose: NEGATIVE
Urobilinogen, UA: 0.2 (ref 0.0–1.0)
pH: 5.5 (ref 5.0–8.0)

## 2011-12-15 LAB — MICROALBUMIN / CREATININE URINE RATIO: Creatinine,U: 297.9 mg/dL

## 2011-12-15 LAB — HEPATIC FUNCTION PANEL
AST: 16 U/L (ref 0–37)
Albumin: 3.4 g/dL — ABNORMAL LOW (ref 3.5–5.2)
Alkaline Phosphatase: 72 U/L (ref 39–117)
Bilirubin, Direct: 0.1 mg/dL (ref 0.0–0.3)
Total Bilirubin: 0.3 mg/dL (ref 0.3–1.2)

## 2011-12-28 ENCOUNTER — Other Ambulatory Visit: Payer: Self-pay | Admitting: *Deleted

## 2011-12-28 MED ORDER — INSULIN GLARGINE 100 UNIT/ML ~~LOC~~ SOLN: 290.0000 [IU] | Freq: Every day | SUBCUTANEOUS | Status: DC

## 2011-12-28 NOTE — Telephone Encounter (Signed)
R'cd fax from [facility] for refill of Lantus.

## 2012-01-22 ENCOUNTER — Telehealth: Payer: Self-pay | Admitting: Endocrinology

## 2012-01-22 MED ORDER — GLUCOSE BLOOD VI STRP: ORAL_STRIP | Status: DC

## 2012-01-22 MED ORDER — ONETOUCH DELICA LANCETS MISC
Status: DC
Start: 2012-01-22 — End: 2012-01-25

## 2012-01-22 MED ORDER — INSULIN GLARGINE 100 UNIT/ML ~~LOC~~ SOLN: 250.0000 [IU] | Freq: Every day | SUBCUTANEOUS | Status: DC

## 2012-01-22 MED ORDER — ONETOUCH ULTRA MINI W/DEVICE KIT: 1.0000 | PACK | Freq: Once | Status: DC

## 2012-01-22 NOTE — Telephone Encounter (Signed)
Caller: Theodosia/Patient; PCP: Oliver Barre; CB#: [phone]; ; ; Call regarding Problems With "Sugar Dropping";  She states that her physician has been raising her insulin, she cannot tell me why.   She states the last time she was in the office 12/2011 he raised her Lantus to #290 units.  She states she does not check her sugar very often and  has not checked her sugar today but knows her sugar drops because she gets dizzy ,lightheaded and confused. She states this has happened to her four times this week . She states she drank a  cup and half of orange juice and ate. She states with the other episodes she is having to keep sugary drinks and items with her.    she cannot check her sugar because she cannot find her supplies.  She states at present she feels "shakey on inside".  Awake and oriented , slow in speech.  Contacted the office to speak with staff regarding Ms. Janee Morn and spoke with [employer].  She talked with Dr.[name] who advised Ms. Klontz to decrease insulin #250 units daily.  They will call into the pharmacy Diabetic testing equipment and she should check sugars before every meal and bedtime. Please record and bring results with her at Appt. Scheduled for Monday August 5th at 3:00pm with Dr. Everardo All to review status.   Advised Ms. Janee Morn of orders.  Caller understands that if she gets in trouble she should call EMS or go to the ER.  Encouraged her to check GBS. She states meter hurts her fingers and wants the one to check on your arm.  Spoke with Morrie Sheldon she states Medicare will not pay for that machine.   Reviewd care with Ms. Rothert understanding expressed.  Emergent s/sx ruled out per Diabetes : Control  Problems Protocol with exception to "New or increasing glucose out of control and taking medication following therapy as prescribed".  See provider in 4 hours.  Office aware.

## 2012-01-25 ENCOUNTER — Ambulatory Visit (INDEPENDENT_AMBULATORY_CARE_PROVIDER_SITE_OTHER): Payer: Medicare Other | Admitting: Endocrinology

## 2012-01-25 ENCOUNTER — Other Ambulatory Visit: Payer: Self-pay

## 2012-01-25 ENCOUNTER — Encounter: Payer: Self-pay | Admitting: Endocrinology

## 2012-01-25 VITALS — BP 132/82 | HR 78 | Temp 97.7°F | Wt 320.0 lb

## 2012-01-25 DIAGNOSIS — E1049 Type 1 diabetes mellitus with other diabetic neurological complication: Secondary | ICD-10-CM | POA: Diagnosis not present

## 2012-01-25 DIAGNOSIS — E1142 Type 2 diabetes mellitus with diabetic polyneuropathy: Secondary | ICD-10-CM | POA: Diagnosis not present

## 2012-01-25 DIAGNOSIS — E1149 Type 2 diabetes mellitus with other diabetic neurological complication: Secondary | ICD-10-CM

## 2012-01-25 DIAGNOSIS — E1065 Type 1 diabetes mellitus with hyperglycemia: Secondary | ICD-10-CM

## 2012-01-25 MED ORDER — ONETOUCH ULTRASOFT LANCETS MISC: Status: DC

## 2012-01-25 MED ORDER — IRBESARTAN 300 MG PO TABS: 300.0000 mg | ORAL_TABLET | Freq: Every day | ORAL | Status: DC

## 2012-01-25 NOTE — Progress Notes (Signed)
Subjective:    Patient ID: Christy May, female    DOB: [birth year], 61 y.o.   MRN: [id number]  HPI Pt returns for f/u of DM (dx'ed approx 1985--complicated by peripheral sensory neuropathy), characterized by insulin resistance. Insulin was reduced last week, due to hypoglycemia. She has obtained a new cbg meter, and she brings a record of her cbg's which i have reviewed today.  She started checking just 2 days ago.  It varies from 116-160.  It is in general higher as the day goes on.  She is having episodes of diaphoresis and generalized weakness.  sxs resolve with oral intake, but she has not actually checked cbg during these episodes.  Last week, insulin was decreased, but sxs persist.  Past Medical History  Diagnosis Date  . Anemia, unspecified   . Extrinsic asthma, unspecified   . Chronic airway obstruction, not elsewhere classified   . Spondylosis of unspecified site without mention of myelopathy   . Dehydration   . Depressive disorder, not elsewhere classified   . Type II or unspecified type diabetes mellitus without mention of complication, not stated as uncontrolled   . Esophageal reflux   . Unspecified essential hypertension   . Unspecified menopausal and postmenopausal disorder   . Obesity, unspecified   . Inflammatory and toxic neuropathy, unspecified   . Unspecified sleep apnea   . Unspecified vitamin D deficiency   . Dysphagia 2007    historyof dysphagia with severe dysmotility by barium Everardo All    Past Surgical History  Procedure Date  . Laser sugery     bilateral eyes  . Tonsillectomy   . Back surgery     fusion-multiple cervical spine levels-Dr. Lovell Sheehan  . Excision of abcess     ?? Chest  . Cholecystectomy   . Dilation and curettage of uterus   . Exploratory laparotomy   . Partial hysterectomy   . Total knee arthroplasty 2005    Left  . Total knee arthroplasty 2005    Right- Dr. Turner Daniels  . Foot surgery     Right X 2    History   Social  History  . Marital Status: Widowed    Spouse Name: N/A    Number of Children: N/A  . Years of Education: N/A   Occupational History  . [employer] (former)    Social History Main Topics  . Smoking status: Former Smoker    Quit date: 06/22/1986  . Smokeless tobacco: Not on file  . Alcohol Use: No  . Drug Use: No  . Sexually Active: Not on file   Other Topics Concern  . Not on file   Social History Narrative   Patient does not get regular exercise.Widowed 2004Disabled    Current Outpatient Prescriptions on File Prior to Visit  Medication Sig Dispense Refill  . albuterol (PROVENTIL) (2.5 MG/3ML) 0.083% nebulizer solution Take 2.5 mg by nebulization every 6 (six) hours as needed.        Marland Kitchen aspirin 81 MG tablet Take 81 mg by mouth daily.        . Blood Glucose Monitoring Suppl (ONE TOUCH ULTRA MINI) W/DEVICE KIT 1 Device by Does not apply route once.  1 each  0  . cholecalciferol (VITAMIN D) 1000 UNITS tablet Take 2,000 Units by mouth daily.        . clindamycin (CLEOCIN) 150 MG capsule Take 150 mg by mouth as directed. Take 4 capsules 1 hour before dental procedures.        Marland Kitchen  Fluticasone-Salmeterol (ADVAIR DISKUS) 100-50 MCG/DOSE AEPB Inhale 1 puff into the lungs every 12 (twelve) hours.        . gabapentin (NEURONTIN) 600 MG tablet TAKE ONE TABLET BY MOUTH THREE TIMES DAILY  270 tablet  1  . glucose blood (ONE TOUCH ULTRA TEST) test strip Use as instructed to check blood sugar twice daily dx 250.63  100 each  5  . HYDROcodone-acetaminophen (NORCO) 5-325 MG per tablet Take 1 tablet by mouth every 6 (six) hours as needed for pain.  60 tablet  2  . irbesartan (AVAPRO) 300 MG tablet Take 1 tablet (300 mg total) by mouth daily.  90 tablet  2  . KLOR-CON 10 10 MEQ CR tablet TAKE 1 TABLET BY MOUTH TWICE DAILY  180 tablet  3  . metoprolol (LOPRESSOR) 50 MG tablet TAKE 1 1/2 TABLETS BY MOUTH TWICE DAILY  270 tablet  3  . traMADol (ULTRAM-ER) 300 MG 24 hr tablet TAKE ONE TABLET BY MOUTH  ONE TIME DAILY  90 tablet  0  . VESICARE 10 MG tablet TAKE ONE TABLET BY MOUTH ONE TIME DAILY  90 each  1  . omeprazole (PRILOSEC) 20 MG capsule TAKE ONE CAPSULE BY MOUTH TWICE DAILY  180 capsule  1  . DISCONTD: insulin glargine (LANTUS) 100 UNIT/ML injection Inject 200 Units into the skin daily. and syringes, 1 daily.  70 mL  2  . DISCONTD: insulin glargine (LANTUS) 100 UNIT/ML injection Inject 225 Units into the skin daily. and syringes, 1 daily.       Marland Kitchen DISCONTD: insulin glargine (LANTUS) 100 UNIT/ML injection Inject 225 Units into the skin daily. and syringes, 1 daily.  80 mL  11    Allergies  Allergen Reactions  . Ace Inhibitors   . Penicillins     Family History  Problem Relation Age of Onset  . Esophageal cancer Brother   . Esophageal cancer Sister     ?  . Colon polyps Brother   . Pancreatic cancer Sister     ?  . Diabetes Sister   . Diabetes      Aunt and Uncle  . Heart disease Maternal Grandfather     BP 132/82  Pulse 78  Temp 97.7 F (36.5 C) (Oral)  Wt 320 lb (145.151 kg)  SpO2 99%  Review of Systems Denies LOC.    Objective:   Physical Exam VITAL SIGNS:  See vs page GENERAL: no distress Pulses: dorsalis pedis intact bilat.   Feet: no deformity.  no ulcer on the feet.  feet are of normal color and temp.  Trace bilat leg edema Neuro: sensation is intact to touch on the feet, but decreased from normal.      Assessment & Plan:  DM.  therapy limited by pt's need for a simple regimen.  Still overcontrolled

## 2012-01-25 NOTE — Patient Instructions (Addendum)
Reduce insulin to 225 units daily.   Please come back for a follow-up appointment for 1 month.   check your blood sugar twice a day.  vary the time of day when you check, between before the 3 meals, and at bedtime.  also check if you have symptoms of your blood sugar being too high or too low (this is very important, especially in view of your recent symptoms).  please keep a record of the readings and bring it to your next appointment here.  please call us sooner if your blood sugar goes below 70, or if it stays over 200.

## 2012-01-26 ENCOUNTER — Ambulatory Visit (INDEPENDENT_AMBULATORY_CARE_PROVIDER_SITE_OTHER): Payer: Medicare Other | Admitting: Internal Medicine

## 2012-01-26 ENCOUNTER — Encounter: Payer: Self-pay | Admitting: Internal Medicine

## 2012-01-26 VITALS — BP 130/82 | HR 79 | Temp 97.0°F | Ht 64.0 in | Wt 320.4 lb

## 2012-01-26 DIAGNOSIS — R109 Unspecified abdominal pain: Secondary | ICD-10-CM | POA: Diagnosis not present

## 2012-01-26 DIAGNOSIS — F329 Major depressive disorder, single episode, unspecified: Secondary | ICD-10-CM

## 2012-01-26 DIAGNOSIS — I10 Essential (primary) hypertension: Secondary | ICD-10-CM | POA: Diagnosis not present

## 2012-01-26 DIAGNOSIS — J449 Chronic obstructive pulmonary disease, unspecified: Secondary | ICD-10-CM

## 2012-01-26 MED ORDER — CYCLOBENZAPRINE HCL 5 MG PO TABS: 5.0000 mg | ORAL_TABLET | Freq: Three times a day (TID) | ORAL | Status: DC | PRN

## 2012-01-26 NOTE — Patient Instructions (Addendum)
Take all new medications as prescribed Continue all other medications as before OK to take Miralax daily (except if you get loose stools) at 17 gm per day You can also consider taking stool softner such as colace 100 mg twice per day as needed for softening

## 2012-01-31 ENCOUNTER — Encounter: Payer: Self-pay | Admitting: Internal Medicine

## 2012-01-31 NOTE — Assessment & Plan Note (Signed)
stable overall by hx and exam, most recent data reviewed with pt, and pt to continue medical treatment as before SpO2 Readings from Last 3 Encounters:  01/26/12 93%  01/25/12 99%  12/14/11 96%

## 2012-01-31 NOTE — Assessment & Plan Note (Signed)
stable overall by hx and exam, most recent data reviewed with pt, and pt to continue medical treatment as before Lab Results  Component Value Date   WBC 6.0 11/21/2010   HGB 12.2 11/21/2010   HCT 37.4 11/21/2010   PLT 196.0 11/21/2010   GLUCOSE 306* 11/21/2010   CHOL 180 11/21/2010   TRIG 108.0 11/21/2010   HDL 50.10 11/21/2010   LDLCALC 108* 11/21/2010   ALT 16 12/14/2011   AST 16 12/14/2011   NA 138 11/21/2010   K 4.7 11/21/2010   CL 103 11/21/2010   CREATININE 0.9 11/21/2010   BUN 22 11/21/2010   CO2 28 11/21/2010   TSH 1.66 11/21/2010   HGBA1C 8.1* 12/14/2011   MICROALBUR 4.7* 12/14/2011

## 2012-01-31 NOTE — Progress Notes (Signed)
Subjective:    Patient ID: Christy May, female    DOB: 30-Mar-1951, 61 y.o.   MRN: [id number]  HPI  Here with recurring RUQ discomfort (s/p ccx) since at least jan 2013 when she slipped out her shower seat to the floor of the shower;  Had diificult time picking herself up again as she was alone but no overt injury at the time; since then has had RUQ/epigastric pain assoc with hard BM's, some occasional nausea without fever, blood, radiation to the back or vomiting, wt loss; in fact has gained further wt since then several lbs.  [employer], but has not been taking the miralax as daily use leads to loose stools.  Overall good compliance with treatment, and good medicine tolerability, including the PPI.  No back pain or radicular symptoms, takes gabapentin as well.  Last colonsocopy 2007.  Pt denies chest pain, increased sob or doe, wheezing, orthopnea, PND, increased LE swelling, palpitations, dizziness or syncope.  Pt denies new neurological symptoms such as new headache, or facial or extremity weakness or numbness   Pt denies polydipsia, polyuria, although did have several what she thinks may have been lower sugar last wk, but not sure.   Pt denies fever, wt loss, night sweats, loss of appetite, or other constitutional symptoms  Past Medical History  Diagnosis Date  . Anemia, unspecified   . Extrinsic asthma, unspecified   . Chronic airway obstruction, not elsewhere classified   . Spondylosis of unspecified site without mention of myelopathy   . Dehydration   . Depressive disorder, not elsewhere classified   . Type II or unspecified type diabetes mellitus without mention of complication, not stated as uncontrolled   . Esophageal reflux   . Unspecified essential hypertension   . Unspecified menopausal and postmenopausal disorder   . Obesity, unspecified   . Inflammatory and toxic neuropathy, unspecified   . Unspecified sleep apnea   . Unspecified vitamin D deficiency   . Dysphagia 2007    historyof dysphagia with severe dysmotility by barium Everardo All   Past Surgical History  Procedure Date  . Laser sugery     bilateral eyes  . Tonsillectomy   . Back surgery     fusion-multiple cervical spine levels-Dr. Lovell Sheehan  . Excision of abcess     ?? Chest  . Cholecystectomy   . Dilation and curettage of uterus   . Exploratory laparotomy   . Partial hysterectomy   . Total knee arthroplasty 2005    Left  . Total knee arthroplasty 2005    Right- Dr. Turner Daniels  . Foot surgery     Right X 2    reports that she quit smoking about 25 years ago. She does not have any smokeless tobacco history on file. She reports that she does not drink alcohol or use illicit drugs. family history includes Colon polyps in her brother; Diabetes in her sister and unspecified family member; Esophageal cancer in her brother and sister; Heart disease in her maternal grandfather; and Pancreatic cancer in her sister. Allergies  Allergen Reactions  . Ace Inhibitors   . Penicillins    Review of Systems Constitutional: Negative for diaphoresis and unexpected weight change.  HENT: Negative for tinnitus.   Eyes: Negative for photophobia and visual disturbance.  Respiratory: Negative for choking and stridor.   Gastrointestinal: Negative for vomiting and blood in stool.  Genitourinary: Negative for hematuria and decreased urine volume.  Musculoskeletal: Negative for gait problem.  Skin: Negative for color change and  wound.  Neurological: Negative for tremors and numbness.  Objective:   Physical Exam BP 130/82  Pulse 79  Temp 97 F (36.1 C) (Oral)  Ht 5\' 4"  (1.626 m)  Wt 320 lb 6 oz (145.321 kg)  BMI 54.99 kg/m2  SpO2 93% Physical Exam  VS noted, not ill appearing Constitutional: Pt appears well-developed and well-nourished.  HENT: Head: Normocephalic.  Right Ear: External ear normal.  Left Ear: External ear normal.  Eyes: Conjunctivae and EOM are normal. Pupils are equal, round, and  reactive to light.  Neck: Normal range of motion. Neck supple.  Cardiovascular: Normal rate and regular rhythm.   Pulmonary/Chest: Effort normal and breath sounds normal.  Abd:  Soft, NT, non-distended, + BS, benign Neurological: Pt is alert. Not confused.  Skin: Skin is warm. No erythema. No rash Psychiatric: Pt behavior is normal. Thought content normal.     Assessment & Plan:

## 2012-01-31 NOTE — Assessment & Plan Note (Signed)
?   MSK - exam benign, afeb, suspect constapation related;  To cont vesicare and ultram for now, but considher change if miralax not working at re-start, using every 2-3 days if causes loose stool, and consider stool softner bid prn as well

## 2012-01-31 NOTE — Assessment & Plan Note (Signed)
stable overall by hx and exam, most recent data reviewed with pt, and pt to continue medical treatment as before BP Readings from Last 3 Encounters:  01/26/12 130/82  01/25/12 132/82  12/14/11 124/62

## 2012-02-25 ENCOUNTER — Ambulatory Visit: Payer: Medicare Other | Admitting: Endocrinology

## 2012-02-25 DIAGNOSIS — Z0289 Encounter for other administrative examinations: Secondary | ICD-10-CM

## 2012-03-02 ENCOUNTER — Other Ambulatory Visit: Payer: Self-pay

## 2012-03-02 MED ORDER — HYDROCODONE-ACETAMINOPHEN 5-325 MG PO TABS: 1.0000 | ORAL_TABLET | Freq: Four times a day (QID) | ORAL | Status: DC | PRN

## 2012-03-02 NOTE — Telephone Encounter (Signed)
Faxed hardcopy to pharmacy. 

## 2012-03-02 NOTE — Telephone Encounter (Signed)
Done hardcopy to robin  

## 2012-03-28 DIAGNOSIS — M779 Enthesopathy, unspecified: Secondary | ICD-10-CM | POA: Diagnosis not present

## 2012-03-28 DIAGNOSIS — B351 Tinea unguium: Secondary | ICD-10-CM | POA: Diagnosis not present

## 2012-03-28 DIAGNOSIS — L84 Corns and callosities: Secondary | ICD-10-CM | POA: Diagnosis not present

## 2012-03-31 ENCOUNTER — Encounter (HOSPITAL_COMMUNITY): Payer: Self-pay | Admitting: *Deleted

## 2012-03-31 ENCOUNTER — Emergency Department (HOSPITAL_COMMUNITY)
Admission: EM | Admit: 2012-03-31 | Discharge: 2012-03-31 | Disposition: A | Discharge status: Home / Self Care | Payer: Medicare Other | Attending: Emergency Medicine | Admitting: Emergency Medicine

## 2012-03-31 DIAGNOSIS — Z794 Long term (current) use of insulin: Secondary | ICD-10-CM | POA: Insufficient documentation

## 2012-03-31 DIAGNOSIS — R7301 Impaired fasting glucose: Secondary | ICD-10-CM | POA: Diagnosis not present

## 2012-03-31 DIAGNOSIS — R300 Dysuria: Secondary | ICD-10-CM | POA: Insufficient documentation

## 2012-03-31 DIAGNOSIS — R61 Generalized hyperhidrosis: Secondary | ICD-10-CM | POA: Diagnosis not present

## 2012-03-31 DIAGNOSIS — E119 Type 2 diabetes mellitus without complications: Secondary | ICD-10-CM | POA: Insufficient documentation

## 2012-03-31 DIAGNOSIS — K219 Gastro-esophageal reflux disease without esophagitis: Secondary | ICD-10-CM | POA: Diagnosis not present

## 2012-03-31 DIAGNOSIS — I10 Essential (primary) hypertension: Secondary | ICD-10-CM | POA: Insufficient documentation

## 2012-03-31 DIAGNOSIS — R739 Hyperglycemia, unspecified: Secondary | ICD-10-CM

## 2012-03-31 DIAGNOSIS — Z7982 Long term (current) use of aspirin: Secondary | ICD-10-CM | POA: Insufficient documentation

## 2012-03-31 DIAGNOSIS — R5381 Other malaise: Secondary | ICD-10-CM | POA: Diagnosis not present

## 2012-03-31 DIAGNOSIS — R350 Frequency of micturition: Secondary | ICD-10-CM | POA: Diagnosis not present

## 2012-03-31 DIAGNOSIS — Z79899 Other long term (current) drug therapy: Secondary | ICD-10-CM | POA: Diagnosis not present

## 2012-03-31 DIAGNOSIS — A084 Viral intestinal infection, unspecified: Secondary | ICD-10-CM

## 2012-03-31 DIAGNOSIS — A088 Other specified intestinal infections: Secondary | ICD-10-CM | POA: Insufficient documentation

## 2012-03-31 DIAGNOSIS — R079 Chest pain, unspecified: Secondary | ICD-10-CM | POA: Insufficient documentation

## 2012-03-31 DIAGNOSIS — R5383 Other fatigue: Secondary | ICD-10-CM | POA: Insufficient documentation

## 2012-03-31 LAB — CBC WITH DIFFERENTIAL/PLATELET
Basophils Absolute: 0 10*3/uL (ref 0.0–0.1)
Basophils Relative: 0 % (ref 0–1)
Hemoglobin: 10 g/dL — ABNORMAL LOW (ref 12.0–15.0)
MCHC: 31.9 g/dL (ref 30.0–36.0)
Monocytes Relative: 15 % — ABNORMAL HIGH (ref 3–12)
Neutro Abs: 5.8 10*3/uL (ref 1.7–7.7)
Neutrophils Relative %: 70 % (ref 43–77)

## 2012-03-31 LAB — URINALYSIS, ROUTINE W REFLEX MICROSCOPIC
Glucose, UA: >1000 mg/dL — AB
Hgb urine dipstick: NEGATIVE
Protein, ur: 100 mg/dL — AB

## 2012-03-31 LAB — COMPREHENSIVE METABOLIC PANEL
ALT: 38 U/L — ABNORMAL HIGH (ref 0–35)
AST: 29 U/L (ref 0–37)
Albumin: 2.4 g/dL — ABNORMAL LOW (ref 3.5–5.2)
Alkaline Phosphatase: 77 U/L (ref 39–117)
BUN: 21 mg/dL (ref 6–23)
Chloride: 98 mEq/L (ref 96–112)
Potassium: 4.6 mEq/L (ref 3.5–5.1)
Total Bilirubin: 0.4 mg/dL (ref 0.3–1.2)

## 2012-03-31 LAB — URINE MICROSCOPIC-ADD ON

## 2012-03-31 MED ORDER — PROMETHAZINE HCL 25 MG/ML IJ SOLN
25.0000 mg | Freq: Once | INTRAMUSCULAR | Status: AC
  Administered 2012-03-31: 25 mg via INTRAVENOUS
  Filled 2012-03-31: qty 1

## 2012-03-31 MED ORDER — ONDANSETRON HCL 4 MG/2ML IJ SOLN
4.0000 mg | Freq: Once | INTRAMUSCULAR | Status: AC
  Administered 2012-03-31: 4 mg via INTRAVENOUS
  Filled 2012-03-31: qty 2

## 2012-03-31 MED ORDER — SODIUM CHLORIDE 0.9 % IV BOLUS (SEPSIS)
1000.0000 mL | Freq: Once | INTRAVENOUS | Status: AC
  Administered 2012-03-31: 1000 mL via INTRAVENOUS

## 2012-03-31 MED ORDER — METOCLOPRAMIDE HCL 10 MG PO TABS: 5.0000 mg | ORAL_TABLET | Freq: Four times a day (QID) | ORAL | Status: DC

## 2012-03-31 MED ORDER — ONDANSETRON HCL 4 MG PO TABS: 4.0000 mg | ORAL_TABLET | Freq: Three times a day (TID) | ORAL | Status: DC | PRN

## 2012-03-31 NOTE — ED Notes (Signed)
Spoke with Radford Pax, MD about pt's nausea.

## 2012-03-31 NOTE — ED Notes (Signed)
Has been incontinent of urine at home, smells strongly of urine. Changed into a gown, pad placed under patient

## 2012-03-31 NOTE — ED Provider Notes (Signed)
History     CSN: [id number]  Arrival date & time 03/31/12  1547   First MD Initiated Contact with Patient 03/31/12 1622      Chief Complaint  Patient presents with  . Hyperglycemia  . Dysuria  . Urinary Frequency    (Consider location/radiation/quality/duration/timing/severity/associated sxs/prior treatment) HPI CC: Feels weak and frequent urination  Feels weak: Started 3 days ago. General weak feeling. Decreased PO and increased thirst. Compliant w/ home insulin regimen of 225units daily. Denies sick contacts. Daily BMs. Nausea w/o emesis. No alleviating or aggravating factors.   Frequent urination: Started about 3 days ago. Urinates x6 during day and x10 at night. Denies pain on urination or hematuria. No particular odor.   CP: Pain feels like someone sitting on chest. Started yesterday. Better w/ pain meds. Worse when other aches and pains worsen. Pain present off and on since neck surgery in 2006. Significant cardio workup in past including stress tests have been negative. No radiation of pain. Denies palpitations, syncope, dizziness, diaphoresis.   Past Medical History  Diagnosis Date  . Anemia, unspecified   . Extrinsic asthma, unspecified   . Chronic airway obstruction, not elsewhere classified   . Spondylosis of unspecified site without mention of myelopathy   . Dehydration   . Depressive disorder, not elsewhere classified   . Type II or unspecified type diabetes mellitus without mention of complication, not stated as uncontrolled   . Esophageal reflux   . Unspecified essential hypertension   . Unspecified menopausal and postmenopausal disorder   . Obesity, unspecified   . Inflammatory and toxic neuropathy, unspecified   . Unspecified sleep apnea   . Unspecified vitamin D deficiency   . Dysphagia 2007    historyof dysphagia with severe dysmotility by barium Everardo All    Past Surgical History  Procedure Date  . Laser sugery     bilateral eyes  .  Tonsillectomy   . Back surgery     fusion-multiple cervical spine levels-Dr. Lovell Sheehan  . Excision of abcess     ?? Chest  . Cholecystectomy   . Dilation and curettage of uterus   . Exploratory laparotomy   . Partial hysterectomy   . Total knee arthroplasty 2005    Left  . Total knee arthroplasty 2005    Right- Dr. Turner Daniels  . Foot surgery     Right X 2    Family History  Problem Relation Age of Onset  . Esophageal cancer Brother   . Esophageal cancer Sister     ?  . Colon polyps Brother   . Pancreatic cancer Sister     ?  . Diabetes Sister   . Diabetes      Aunt and Uncle  . Heart disease Maternal Grandfather     History  Substance Use Topics  . Smoking status: Former Smoker    Quit date: 06/22/1986  . Smokeless tobacco: Never Used  . Alcohol Use: No    OB History    Grav Para Term Preterm Abortions TAB SAB Ect Mult Living                  Review of Systems Per hpi Allergies  Ace inhibitors; Penicillins; and Tape  Home Medications   Current Outpatient Rx  Name Route Sig Dispense Refill  . ALBUTEROL SULFATE (2.5 MG/3ML) 0.083% IN NEBU Nebulization Take 2.5 mg by nebulization every 6 (six) hours as needed. Respiratory distress    . ASPIRIN 81 MG PO TABS Oral Take  81 mg by mouth daily.      Letta Pate ULTRA MINI W/DEVICE KIT Does not apply 1 Device by Does not apply route once.    Marland Kitchen VITAMIN D 1000 UNITS PO TABS Oral Take 2,000 Units by mouth daily.      Marland Kitchen FLUTICASONE-SALMETEROL 100-50 MCG/DOSE IN AEPB Inhalation Inhale 1 puff into the lungs every 12 (twelve) hours.      Marland Kitchen GABAPENTIN 600 MG PO TABS      . GLUCOSE BLOOD VI STRP Other 1 each by Other route as needed. Use as instructed to check blood sugar twice daily dx 250.63    . HYDROCODONE-ACETAMINOPHEN 5-325 MG PO TABS Oral Take 1 tablet by mouth every 6 (six) hours as needed. Pain    . INSULIN GLARGINE 100 UNIT/ML [place] SOLN Subcutaneous Inject 225 Units into the skin daily. and syringes, 1 daily.    .  IRBESARTAN 300 MG PO TABS Oral Take 300 mg by mouth daily.    Letta Pate ULTRASOFT LANCETS MISC Other 1 each by Other route as needed. Use as instructed to check blood sugar twice daily dx 250.63    . METOPROLOL TARTRATE 50 MG PO TABS Oral Take 75 mg by mouth 2 (two) times daily. Pt takes 1 1/2 tab    . OMEPRAZOLE 20 MG PO CPDR Oral Take 20 mg by mouth 2 (two) times daily.    Marland Kitchen POTASSIUM CHLORIDE ER 10 MEQ PO TBCR Oral Take 10 mEq by mouth 2 (two) times daily.    Marland Kitchen PROMETHAZINE HCL 25 MG PO TABS Oral Take 25 mg by mouth every 6 (six) hours as needed. Nausea    . SOLIFENACIN SUCCINATE 10 MG PO TABS Oral Take 5 mg by mouth daily.    . TRAMADOL HCL ER 300 MG PO TB24 Oral Take 300 mg by mouth daily.      BP 116/55  Temp 99.3 F (37.4 C) (Oral)  Resp 24  SpO2 99%  Physical Exam Gen: NAD HEENT: No thyromegaly, normal rom CV: RRR no m/r/g Res: CTAB, normal effort. O2 sats mid to low 90s (Pt on home O2 at night due to COPD) ABD: pain on deep palpitation w/ stool felt bilaterally, Obese Ext: 2+ pulses Musc: CP reproducible on palpation ED Course  Procedures (including critical care time)  Labs Reviewed  CBC WITH DIFFERENTIAL - Abnormal; Notable for the following:    RBC 3.72 (*)     Hemoglobin 10.0 (*)     HCT 31.3 (*)     Monocytes Relative 15 (*)     Monocytes Absolute 1.3 (*)     All other components within normal limits  COMPREHENSIVE METABOLIC PANEL - Abnormal; Notable for the following:    Sodium 133 (*)     Glucose, Bld 252 (*)     Creatinine, Ser 1.42 (*)     Calcium 8.3 (*)     Albumin 2.4 (*)     ALT 38 (*)     GFR calc non Af Amer 39 (*)     GFR calc Af Amer 45 (*)     All other components within normal limits  URINALYSIS, ROUTINE W REFLEX MICROSCOPIC - Abnormal; Notable for the following:    Color, Urine AMBER (*)  BIOCHEMICALS MAY BE AFFECTED BY COLOR   APPearance CLOUDY (*)     Glucose, UA >1000 (*)     Ketones, ur TRACE (*)     Protein, ur 100 (*)     All  other components within normal limits  GLUCOSE, CAPILLARY - Abnormal; Notable for the following:    Glucose-Capillary 281 (*)     All other components within normal limits  URINE MICROSCOPIC-ADD ON   No results found.   No diagnosis found.  EKG normal  MDM  61yo AAF w/ known diabetes w/ likely viral gastroenteritis and hyperglycemic state contributing to symptoms. No anion gap - IVF 1L NS - IV zofran - VS stable and labs wnl - OK for discharge        Ozella Rocks, MD 04/05/12 1042

## 2012-03-31 NOTE — ED Notes (Signed)
[id number]<AV> Expected date:03/31/12<BR> Expected time: 3:42 PM<BR> Means of arrival:<BR> Comments:<BR> 61 female Cbg 337

## 2012-03-31 NOTE — ED Notes (Signed)
Pt expressed desire to go home without her oxygen.  Pt made aware of risks and offered options of either going home on PTAR or have her friend go home to get her oxygen.  Pt refused options and will go home with friend without oxygen.

## 2012-03-31 NOTE — ED Notes (Signed)
To ed via [facility] with c/o high blood sugar, has felt like has had a bladder infection, had urinate about 10 times 2 nights ago.  On arrival to ED-- cool, clammy, pale, resp rapid, unlabored.

## 2012-04-01 ENCOUNTER — Other Ambulatory Visit: Payer: Self-pay | Admitting: Internal Medicine

## 2012-04-01 ENCOUNTER — Telehealth: Payer: Self-pay | Admitting: Internal Medicine

## 2012-04-01 NOTE — Telephone Encounter (Signed)
Caller: [name]/Patient; Patient Name: Christy May; PCP: Romero Belling (Adults only); Best Callback Phone Number: [phone].  Calling regarding Nausea, onset 10-10.  Pt was seen in ED on 10-10 diagnosed with Viral Gastroenteritis.  FSBS = 126.  All emergent symptoms ruled out per Diabetes Gastrointestinal problems, see in 72 hours due to Nausea for 3 days. Advised Pt, per Epic, Reglan was sent to [facility] drug, [employer] [facility].  Advised Pt to call back if symptoms didn't improve after anti-nausea meds.  Pt verbalized understanding.

## 2012-04-06 NOTE — ED Provider Notes (Signed)
I saw and evaluated the patient, reviewed the resident's note and I agree with the findings and plan.   .Face to face Exam:  General:  Awake HEENT:  Atraumatic Resp:  Normal effort Abd:  Nondistended Neuro:No focal weakness Lymph: No adenopathy   Nelia Shi, MD 04/06/12 1153

## 2012-04-12 ENCOUNTER — Other Ambulatory Visit: Payer: Self-pay | Admitting: Internal Medicine

## 2012-04-12 NOTE — Telephone Encounter (Signed)
Done erx 

## 2012-04-13 ENCOUNTER — Ambulatory Visit: Payer: Medicare Other | Admitting: Internal Medicine

## 2012-04-13 DIAGNOSIS — Z0289 Encounter for other administrative examinations: Secondary | ICD-10-CM

## 2012-04-17 ENCOUNTER — Emergency Department (HOSPITAL_COMMUNITY): Payer: Medicare Other

## 2012-04-17 ENCOUNTER — Emergency Department (HOSPITAL_COMMUNITY)
Admission: EM | Admit: 2012-04-17 | Discharge: 2012-04-17 | Disposition: A | Discharge status: Home / Self Care | Payer: Medicare Other | Attending: Emergency Medicine | Admitting: Emergency Medicine

## 2012-04-17 ENCOUNTER — Encounter (HOSPITAL_COMMUNITY): Payer: Self-pay | Admitting: Emergency Medicine

## 2012-04-17 DIAGNOSIS — N959 Unspecified menopausal and perimenopausal disorder: Secondary | ICD-10-CM | POA: Diagnosis not present

## 2012-04-17 DIAGNOSIS — J449 Chronic obstructive pulmonary disease, unspecified: Secondary | ICD-10-CM | POA: Diagnosis not present

## 2012-04-17 DIAGNOSIS — Z87891 Personal history of nicotine dependence: Secondary | ICD-10-CM | POA: Diagnosis not present

## 2012-04-17 DIAGNOSIS — Z79899 Other long term (current) drug therapy: Secondary | ICD-10-CM | POA: Diagnosis not present

## 2012-04-17 DIAGNOSIS — G6181 Chronic inflammatory demyelinating polyneuritis: Secondary | ICD-10-CM | POA: Diagnosis not present

## 2012-04-17 DIAGNOSIS — J45909 Unspecified asthma, uncomplicated: Secondary | ICD-10-CM | POA: Insufficient documentation

## 2012-04-17 DIAGNOSIS — R109 Unspecified abdominal pain: Secondary | ICD-10-CM | POA: Diagnosis not present

## 2012-04-17 DIAGNOSIS — E119 Type 2 diabetes mellitus without complications: Secondary | ICD-10-CM | POA: Diagnosis not present

## 2012-04-17 DIAGNOSIS — I10 Essential (primary) hypertension: Secondary | ICD-10-CM | POA: Insufficient documentation

## 2012-04-17 DIAGNOSIS — M479 Spondylosis, unspecified: Secondary | ICD-10-CM | POA: Diagnosis not present

## 2012-04-17 DIAGNOSIS — D649 Anemia, unspecified: Secondary | ICD-10-CM | POA: Insufficient documentation

## 2012-04-17 DIAGNOSIS — G4730 Sleep apnea, unspecified: Secondary | ICD-10-CM | POA: Diagnosis not present

## 2012-04-17 DIAGNOSIS — R160 Hepatomegaly, not elsewhere classified: Secondary | ICD-10-CM | POA: Diagnosis not present

## 2012-04-17 DIAGNOSIS — K219 Gastro-esophageal reflux disease without esophagitis: Secondary | ICD-10-CM | POA: Insufficient documentation

## 2012-04-17 DIAGNOSIS — F3289 Other specified depressive episodes: Secondary | ICD-10-CM | POA: Insufficient documentation

## 2012-04-17 DIAGNOSIS — F329 Major depressive disorder, single episode, unspecified: Secondary | ICD-10-CM | POA: Insufficient documentation

## 2012-04-17 DIAGNOSIS — J811 Chronic pulmonary edema: Secondary | ICD-10-CM | POA: Diagnosis not present

## 2012-04-17 DIAGNOSIS — Z794 Long term (current) use of insulin: Secondary | ICD-10-CM | POA: Diagnosis not present

## 2012-04-17 DIAGNOSIS — R1031 Right lower quadrant pain: Secondary | ICD-10-CM | POA: Insufficient documentation

## 2012-04-17 DIAGNOSIS — Z7982 Long term (current) use of aspirin: Secondary | ICD-10-CM | POA: Diagnosis not present

## 2012-04-17 DIAGNOSIS — E669 Obesity, unspecified: Secondary | ICD-10-CM | POA: Diagnosis not present

## 2012-04-17 DIAGNOSIS — K838 Other specified diseases of biliary tract: Secondary | ICD-10-CM | POA: Diagnosis not present

## 2012-04-17 DIAGNOSIS — J4489 Other specified chronic obstructive pulmonary disease: Secondary | ICD-10-CM | POA: Insufficient documentation

## 2012-04-17 LAB — BASIC METABOLIC PANEL
BUN: 31 mg/dL — ABNORMAL HIGH (ref 6–23)
Calcium: 9.3 mg/dL (ref 8.4–10.5)
GFR calc Af Amer: 49 mL/min — ABNORMAL LOW (ref >90)
GFR calc non Af Amer: 43 mL/min — ABNORMAL LOW (ref >90)
Potassium: 5.1 mEq/L (ref 3.5–5.1)
Sodium: 131 mEq/L — ABNORMAL LOW (ref 135–145)

## 2012-04-17 LAB — CBC
Hemoglobin: 9.7 g/dL — ABNORMAL LOW (ref 12.0–15.0)
MCHC: 31.7 g/dL (ref 30.0–36.0)
Platelets: 227 10*3/uL (ref 150–400)
RBC: 3.69 MIL/uL — ABNORMAL LOW (ref 3.87–5.11)

## 2012-04-17 LAB — URINALYSIS, ROUTINE W REFLEX MICROSCOPIC
Glucose, UA: NEGATIVE mg/dL
Leukocytes, UA: NEGATIVE
Nitrite: NEGATIVE
Protein, ur: NEGATIVE mg/dL

## 2012-04-17 LAB — GLUCOSE, CAPILLARY: Glucose-Capillary: 234 mg/dL — ABNORMAL HIGH (ref 70–99)

## 2012-04-17 MED ORDER — ONDANSETRON HCL 4 MG/2ML IJ SOLN: 4.0000 mg | INTRAMUSCULAR | Status: DC | PRN

## 2012-04-17 MED ORDER — SODIUM CHLORIDE 0.9 % IV BOLUS (SEPSIS)
1000.0000 mL | Freq: Once | INTRAVENOUS | Status: AC
  Administered 2012-04-17: 1000 mL via INTRAVENOUS

## 2012-04-17 MED ORDER — MORPHINE SULFATE 4 MG/ML IJ SOLN
4.0000 mg | Freq: Once | INTRAMUSCULAR | Status: AC
  Administered 2012-04-17: 4 mg via INTRAVENOUS
  Filled 2012-04-17: qty 1

## 2012-04-17 MED ORDER — IOHEXOL 300 MG/ML  SOLN
100.0000 mL | Freq: Once | INTRAMUSCULAR | Status: AC | PRN
  Administered 2012-04-17: 100 mL via INTRAVENOUS

## 2012-04-17 NOTE — ED Notes (Signed)
Pt asked to give urine specimen but can't void at present moment. 

## 2012-04-17 NOTE — ED Notes (Signed)
Patient states she's been seen here on Monday for the same symptoms.  Patient c/o right flank pain.  Patient reports fevers up to 99.3 at home.  Patient reports pain of 10/10.

## 2012-04-17 NOTE — ED Notes (Signed)
Patient transported to CT 

## 2012-04-17 NOTE — ED Notes (Signed)
Pt c/o of right flank pain, nausea. Denies difficulty and burning with urination.

## 2012-04-17 NOTE — ED Provider Notes (Signed)
History     CSN: [id number]  Arrival date & time 04/17/12  1250   First MD Initiated Contact with Patient 04/17/12 1329      Chief Complaint  Patient presents with  . Flank Pain    (Consider location/radiation/quality/duration/timing/severity/associated sxs/prior treatment) HPI Comments: 61 year old morbidly obese type II diabetic presents emergency department complaining of urinary frequency, nocturia and right lower quadrant abdominal pain.  Onset of symptoms began approximately 2 weeks ago when patient was evaluated in the emergency department on Monday for similar presentation.  At that time hyperglycemia was found and patient was treated with fluids and nausea medication.  Patient has surgical history including appendectomy and cholecystectomy.  Last normal bowel movement was earlier today, but patient does report recent constipation that she treated with  GlycoLax.  The patient denies any decrease in appetite, fever, night sweats, chills, weight loss, dysuria, hematuria, vaginal dc, emesis, melena, hematochezia, abdominal trauma, chest pain, shortness of breath, cough or dyspnea on exertion.    The history is provided by the patient.    Past Medical History  Diagnosis Date  . Anemia, unspecified   . Extrinsic asthma, unspecified   . Chronic airway obstruction, not elsewhere classified   . Spondylosis of unspecified site without mention of myelopathy   . Dehydration   . Depressive disorder, not elsewhere classified   . Type II or unspecified type diabetes mellitus without mention of complication, not stated as uncontrolled   . Esophageal reflux   . Unspecified essential hypertension   . Unspecified menopausal and postmenopausal disorder   . Obesity, unspecified   . Inflammatory and toxic neuropathy, unspecified   . Unspecified sleep apnea   . Unspecified vitamin D deficiency   . Dysphagia 2007    historyof dysphagia with severe dysmotility by barium Everardo All     Past Surgical History  Procedure Date  . Laser sugery     bilateral eyes  . Tonsillectomy   . Back surgery     fusion-multiple cervical spine levels-Dr. Lovell Sheehan  . Excision of abcess     ?? Chest  . Cholecystectomy   . Dilation and curettage of uterus   . Exploratory laparotomy   . Partial hysterectomy   . Total knee arthroplasty 2005    Left  . Total knee arthroplasty 2005    Right- Dr. Turner Daniels  . Foot surgery     Right X 2    Family History  Problem Relation Age of Onset  . Esophageal cancer Brother   . Esophageal cancer Sister     ?  . Colon polyps Brother   . Pancreatic cancer Sister     ?  . Diabetes Sister   . Diabetes      Aunt and Uncle  . Heart disease Maternal Grandfather     History  Substance Use Topics  . Smoking status: Former Smoker    Quit date: 06/22/1986  . Smokeless tobacco: Never Used  . Alcohol Use: No    OB History    Grav Para Term Preterm Abortions TAB SAB Ect Mult Living                  Review of Systems  All other systems reviewed and are negative.    Allergies  Ace inhibitors; Penicillins; and Tape  Home Medications   Current Outpatient Rx  Name Route Sig Dispense Refill  . ALBUTEROL SULFATE (2.5 MG/3ML) 0.083% IN NEBU Nebulization Take 2.5 mg by nebulization every 6 (six) hours as  needed. Respiratory distress    . ASPIRIN 81 MG PO TABS Oral Take 81 mg by mouth daily.      Marland Kitchen VITAMIN D 1000 UNITS PO TABS Oral Take 2,000 Units by mouth daily.      Marland Kitchen FLUTICASONE-SALMETEROL 100-50 MCG/DOSE IN AEPB Inhalation Inhale 1 puff into the lungs every 12 (twelve) hours.      Marland Kitchen GABAPENTIN 600 MG PO TABS Oral Take 600 mg by mouth 3 (three) times daily.     Marland Kitchen HYDROCODONE-ACETAMINOPHEN 5-325 MG PO TABS Oral Take 1 tablet by mouth every 6 (six) hours as needed. Pain    . INSULIN GLARGINE 100 UNIT/ML [place] SOLN Subcutaneous Inject 225 Units into the skin daily. and syringes, 1 daily.    . IRBESARTAN 300 MG PO TABS Oral Take 300 mg by mouth  daily.    Marland Kitchen METOPROLOL TARTRATE 50 MG PO TABS Oral Take 75 mg by mouth 2 (two) times daily.     Marland Kitchen OMEPRAZOLE 20 MG PO CPDR Oral Take 20 mg by mouth 2 (two) times daily.    Marland Kitchen POLYETHYLENE GLYCOL 3350 PO PACK Oral Take 17 g by mouth daily as needed. For constipation.    Marland Kitchen POTASSIUM CHLORIDE ER 10 MEQ PO TBCR Oral Take 10 mEq by mouth 2 (two) times daily.    Marland Kitchen PROMETHAZINE HCL 25 MG PO TABS Oral Take 25 mg by mouth every 6 (six) hours as needed. Nausea    . SOLIFENACIN SUCCINATE 10 MG PO TABS Oral Take 10 mg by mouth daily.     . TRAMADOL HCL ER 300 MG PO TB24 Oral Take 300 mg by mouth daily.    Letta Pate ULTRA MINI W/DEVICE KIT Does not apply 1 Device by Does not apply route once.    Marland Kitchen GLUCOSE BLOOD VI STRP Other 1 each by Other route as needed. Use as instructed to check blood sugar twice daily dx 250.63    . ONETOUCH ULTRASOFT LANCETS MISC Other 1 each by Other route as needed. Use as instructed to check blood sugar twice daily dx 250.63      BP 131/68  Pulse 83  Temp 97.7 F (36.5 C) (Oral)  Resp 20  Ht 5\' 1"  (1.549 m)  Wt 310 lb (140.615 kg)  BMI 58.57 kg/m2  SpO2 94%  Physical Exam  Constitutional: She is oriented to person, place, and time. She appears well-developed and well-nourished. No distress.  HENT:  Head: Normocephalic and atraumatic.  Mouth/Throat: Oropharynx is clear and moist. No oropharyngeal exudate.  Eyes: Conjunctivae normal and EOM are normal. Pupils are equal, round, and reactive to light. No scleral icterus.  Neck: Normal range of motion. Neck supple. No tracheal deviation present. No thyromegaly present.  Cardiovascular: Normal rate, regular rhythm, normal heart sounds and intact distal pulses.   Pulmonary/Chest: Effort normal and breath sounds normal. No stridor. No respiratory distress. She has no wheezes.  Abdominal: Soft.         Morbidly obese abdomen, soft, bowel sounds present.   Musculoskeletal: Normal range of motion. She exhibits no edema and no  tenderness.  Neurological: She is alert and oriented to person, place, and time. Coordination normal.  Skin: Skin is warm and dry. No rash noted. She is not diaphoretic. No erythema. No pallor.  Psychiatric: She has a normal mood and affect. Her behavior is normal.    ED Course  Procedures (including critical care time)  Labs Reviewed  BASIC METABOLIC PANEL - Abnormal; Notable for the following:  Sodium 131 (*)     Glucose, Bld 243 (*)     BUN 31 (*)     Creatinine, Ser 1.31 (*)     GFR calc non Af Amer 43 (*)     GFR calc Af Amer 49 (*)     All other components within normal limits  CBC - Abnormal; Notable for the following:    RBC 3.69 (*)     Hemoglobin 9.7 (*)     HCT 30.6 (*)     All other components within normal limits  GLUCOSE, CAPILLARY - Abnormal; Notable for the following:    Glucose-Capillary 234 (*)     All other components within normal limits  URINALYSIS, ROUTINE W REFLEX MICROSCOPIC   Ct Abdomen Pelvis W Contrast  04/17/2012  *RADIOLOGY REPORT*  Clinical Data: Right flank pain.  CT ABDOMEN AND PELVIS WITH CONTRAST  Technique:  Multidetector CT imaging of the abdomen and pelvis was performed following the standard protocol during bolus administration of intravenous contrast.  Contrast: OMNIPAQUE IOHEXOL 300 MG/ML  SOLN  Comparison: Acute abdominal series 04/17/2012.  Findings: There is linear atelectasis at both lung bases.  No significant pleural effusion is present.  The liver appears mildly enlarged but demonstrates no focal abnormality.  There is fusiform dilatation of the common bile duct to 1.5 cm status post cholecystectomy.  The duct tapers distally. There is no evidence of pancreatic mass or pancreatic ductal dilatation.  The spleen and adrenal glands appear normal.  There is no evidence of renal or ureteral calculus.  There is no hydronephrosis or delayed contrast excretion.  Small low-density renal lesions are noted bilaterally, likely cysts.  The bowel  gas pattern is normal.  No inflammatory changes or enlarged lymph nodes are seen. The appendix is not clearly visualized, but there is no pericecal abnormality.  There is mild aorto iliac atherosclerosis.  There is no pelvic mass status post hysterectomy.  Mild degenerative changes are noted throughout the spine.  IMPRESSION:  1.  No definite acute abdominal pelvic findings identified. Specifically, no evidence of urinary tract calculus or hydronephrosis. 2.  Mild biliary dilatation status post cholecystectomy.  This may be physiologic. 3.  Mild hepatomegaly. 4.  Small low-density renal lesions, likely cysts.   Original Report Authenticated By: Gerrianne Scale, M.D.    Dg Abd Acute W/chest  04/17/2012  *RADIOLOGY REPORT*  Clinical Data: Right side abdominal pain for several weeks of increasing in severity.  ACUTE ABDOMEN SERIES (ABDOMEN 2 VIEW & CHEST 1 VIEW)  Comparison: Plain films abdomen 06/24/2011  Findings: Heart silhouette is mildly enlarged.  There is central venous pulmonary congestion.  No overt pulmonary edema.  No pneumothorax.   Anterior cervical fusion noted.  No dilated loops of large or small bowel.  There is no free air beneath hemidiaphragms.  No pathologic calcifications.  IMPRESSION: 1.  Central venous pulmonary congestion. 2.  No evidence of bowel obstruction or intraperitoneal free air.   Original Report Authenticated By: Genevive Bi, M.D.      No diagnosis found.  BP 131/68  Pulse 83  Temp 97.7 F (36.5 C) (Oral)  Resp 20  Ht 5\' 1"  (1.549 m)  Wt 310 lb (140.615 kg)  BMI 58.57 kg/m2  SpO2 94%   MDM  Abdominal pain, likely gastroparesis.  61 yo obese diabetic female to ER for RLQ abdominal pain x 2 weeks. Patient is nontoxic, nonseptic appearing.  Patient's pain and other symptoms adequately managed in emergency department.  Fluid bolus given.  Labs, imaging and vitals reviewed.  Patient does not meet the SIRS or Sepsis criteria.  On repeat exam patient does not have  a surgical abdomin and there are nor peritoneal signs. Patient discharged home with symptomatic treatment and given strict instructions for follow-up with their primary care physician.  I have also discussed reasons to return immediately to the ER.  Patient expresses understanding and agrees with plan.           Jaci Carrel, [place] 04/17/12 1749

## 2012-04-18 ENCOUNTER — Ambulatory Visit (INDEPENDENT_AMBULATORY_CARE_PROVIDER_SITE_OTHER): Payer: Medicare Other | Admitting: Endocrinology

## 2012-04-18 ENCOUNTER — Encounter: Payer: Self-pay | Admitting: Endocrinology

## 2012-04-18 ENCOUNTER — Telehealth: Payer: Self-pay | Admitting: Internal Medicine

## 2012-04-18 VITALS — BP 110/68 | HR 87 | Temp 97.8°F | Wt 308.0 lb

## 2012-04-18 DIAGNOSIS — E1049 Type 1 diabetes mellitus with other diabetic neurological complication: Secondary | ICD-10-CM | POA: Diagnosis not present

## 2012-04-18 DIAGNOSIS — E119 Type 2 diabetes mellitus without complications: Secondary | ICD-10-CM

## 2012-04-18 DIAGNOSIS — I10 Essential (primary) hypertension: Secondary | ICD-10-CM

## 2012-04-18 DIAGNOSIS — M5412 Radiculopathy, cervical region: Secondary | ICD-10-CM

## 2012-04-18 DIAGNOSIS — J449 Chronic obstructive pulmonary disease, unspecified: Secondary | ICD-10-CM

## 2012-04-18 DIAGNOSIS — E1065 Type 1 diabetes mellitus with hyperglycemia: Secondary | ICD-10-CM | POA: Diagnosis not present

## 2012-04-18 LAB — HEMOGLOBIN A1C
Hgb A1c MFr Bld: 8.6 % — ABNORMAL HIGH (ref <5.7)
Mean Plasma Glucose: 200 mg/dL — ABNORMAL HIGH (ref <117)

## 2012-04-18 NOTE — Telephone Encounter (Signed)
Done per emr 

## 2012-04-18 NOTE — Progress Notes (Signed)
Subjective:    Patient ID: Christy May, female    DOB: [birth year], 61 y.o.   MRN: [id number]  HPI Pt returns for f/u of DM (dx'ed approx 1985--complicated by peripheral sensory neuropathy; characterized by insulin resistance; she has done better with a simpler qd insulin regimen).  3 mos ago, insulin was decreased, but sxs persist.  Pt says she has missed her insulin only once in the past month.  she brings a record of her cbg's which i have reviewed today.  It varies from 70-300, but most are in the 100's.   Past Medical History  Diagnosis Date  . Anemia, unspecified   . Extrinsic asthma, unspecified   . Chronic airway obstruction, not elsewhere classified   . Spondylosis of unspecified site without mention of myelopathy   . Dehydration   . Depressive disorder, not elsewhere classified   . Type II or unspecified type diabetes mellitus without mention of complication, not stated as uncontrolled   . Esophageal reflux   . Unspecified essential hypertension   . Unspecified menopausal and postmenopausal disorder   . Obesity, unspecified   . Inflammatory and toxic neuropathy, unspecified   . Unspecified sleep apnea   . Unspecified vitamin D deficiency   . Dysphagia 2007    historyof dysphagia with severe dysmotility by barium Everardo All    Past Surgical History  Procedure Date  . Laser sugery     bilateral eyes  . Tonsillectomy   . Back surgery     fusion-multiple cervical spine levels-Dr. Lovell Sheehan  . Excision of abcess     ?? Chest  . Cholecystectomy   . Dilation and curettage of uterus   . Exploratory laparotomy   . Partial hysterectomy   . Total knee arthroplasty 2005    Left  . Total knee arthroplasty 2005    Right- Dr. Turner Daniels  . Foot surgery     Right X 2    History   Social History  . Marital Status: Widowed    Spouse Name: N/A    Number of Children: N/A  . Years of Education: N/A   Occupational History  . [employer] (former)    Social  History Main Topics  . Smoking status: Former Smoker    Quit date: 06/22/1986  . Smokeless tobacco: Never Used  . Alcohol Use: No  . Drug Use: No  . Sexually Active: Not on file   Other Topics Concern  . Not on file   Social History Narrative   Patient does not get regular exercise.Widowed 2004Disabled    Current Outpatient Prescriptions on File Prior to Visit  Medication Sig Dispense Refill  . albuterol (PROVENTIL) (2.5 MG/3ML) 0.083% nebulizer solution Take 2.5 mg by nebulization every 6 (six) hours as needed. Respiratory distress      . aspirin 81 MG tablet Take 81 mg by mouth daily.        . Blood Glucose Monitoring Suppl (ONE TOUCH ULTRA MINI) W/DEVICE KIT 1 Device by Does not apply route once.      . cholecalciferol (VITAMIN D) 1000 UNITS tablet Take 2,000 Units by mouth daily.        . Fluticasone-Salmeterol (ADVAIR DISKUS) 100-50 MCG/DOSE AEPB Inhale 1 puff into the lungs every 12 (twelve) hours.        . gabapentin (NEURONTIN) 600 MG tablet Take 600 mg by mouth 3 (three) times daily.       Marland Kitchen glucose blood test strip 1 each by Other route as needed.  Use as instructed to check blood sugar twice daily dx 250.63      . HYDROcodone-acetaminophen (NORCO/VICODIN) 5-325 MG per tablet Take 1 tablet by mouth every 6 (six) hours as needed. Pain      . insulin glargine (LANTUS) 100 UNIT/ML injection Inject 250 Units into the skin daily. and syringes, 1 daily.      . irbesartan (AVAPRO) 300 MG tablet Take 300 mg by mouth daily.      . Lancets (ONETOUCH ULTRASOFT) lancets 1 each by Other route as needed. Use as instructed to check blood sugar twice daily dx 250.63      . metoprolol (LOPRESSOR) 50 MG tablet Take 75 mg by mouth 2 (two) times daily.       Marland Kitchen omeprazole (PRILOSEC) 20 MG capsule Take 20 mg by mouth 2 (two) times daily.      . polyethylene glycol (MIRALAX / GLYCOLAX) packet Take 17 g by mouth daily as needed. For constipation.      . potassium chloride (K-DUR) 10 MEQ tablet Take  10 mEq by mouth 2 (two) times daily.      . promethazine (PHENERGAN) 25 MG tablet Take 25 mg by mouth every 6 (six) hours as needed. Nausea      . solifenacin (VESICARE) 10 MG tablet Take 10 mg by mouth daily.       . traMADol (ULTRAM-ER) 300 MG 24 hr tablet Take 300 mg by mouth daily.        Allergies  Allergen Reactions  . Ace Inhibitors Anaphylaxis  . Penicillins     Unknown   . Tape Hives    Family History  Problem Relation Age of Onset  . Esophageal cancer Brother   . Esophageal cancer Sister     ?  . Colon polyps Brother   . Pancreatic cancer Sister     ?  . Diabetes Sister   . Diabetes      Aunt and Uncle  . Heart disease Maternal Grandfather     BP 110/68  Pulse 87  Temp 97.8 F (36.6 C) (Oral)  Wt 308 lb (139.708 kg)  SpO2 96%    Review of Systems denies hypoglycemia    Objective:   Physical Exam VITAL SIGNS:  See vs page GENERAL: no distress PSYCH: Alert and oriented x 3.  Does not appear anxious nor depressed.       Assessment & Plan:

## 2012-04-18 NOTE — Patient Instructions (Addendum)
blood tests are being requested for you today.  You will be contacted with results. Please come back for a follow-up appointment in 3 months. check your blood sugar twice a day.  vary the time of day when you check, between before the 3 meals, and at bedtime.  also check if you have symptoms of your blood sugar being too high or too low.  please keep a record of the readings and bring it to your next appointment here.  please call us sooner if your blood sugar goes below 70, or if you have a lot of readings over 200. 

## 2012-04-18 NOTE — Telephone Encounter (Signed)
Went to er yesterday.  She needs to be set up for home health because she is alone to help her with her medicine and help her eat.

## 2012-04-18 NOTE — ED Provider Notes (Signed)
Medical screening examination/treatment/procedure(s) were performed by non-physician practitioner and as supervising physician I was immediately available for consultation/collaboration.   Gerhard Munch, MD 04/18/12 [phone]

## 2012-04-20 ENCOUNTER — Other Ambulatory Visit: Payer: Self-pay | Admitting: Internal Medicine

## 2012-04-20 DIAGNOSIS — K219 Gastro-esophageal reflux disease without esophagitis: Secondary | ICD-10-CM | POA: Diagnosis not present

## 2012-04-20 DIAGNOSIS — IMO0002 Reserved for concepts with insufficient information to code with codable children: Secondary | ICD-10-CM | POA: Diagnosis not present

## 2012-04-20 DIAGNOSIS — J449 Chronic obstructive pulmonary disease, unspecified: Secondary | ICD-10-CM | POA: Diagnosis not present

## 2012-04-20 DIAGNOSIS — E119 Type 2 diabetes mellitus without complications: Secondary | ICD-10-CM | POA: Diagnosis not present

## 2012-04-20 DIAGNOSIS — I10 Essential (primary) hypertension: Secondary | ICD-10-CM | POA: Diagnosis not present

## 2012-04-20 DIAGNOSIS — G4733 Obstructive sleep apnea (adult) (pediatric): Secondary | ICD-10-CM | POA: Diagnosis not present

## 2012-04-20 MED ORDER — IRBESARTAN 300 MG PO TABS: 300.0000 mg | ORAL_TABLET | Freq: Every day | ORAL | Status: DC

## 2012-04-20 MED ORDER — POTASSIUM CHLORIDE ER 10 MEQ PO TBCR: 10.0000 meq | EXTENDED_RELEASE_TABLET | Freq: Two times a day (BID) | ORAL | Status: DC

## 2012-04-20 MED ORDER — HYDROCODONE-ACETAMINOPHEN 5-325 MG PO TABS: 1.0000 | ORAL_TABLET | Freq: Four times a day (QID) | ORAL | Status: DC | PRN

## 2012-04-20 NOTE — Telephone Encounter (Signed)
Caller: [name]/Patient; Patient Name: Christy May; PCP: Romero Belling (Adults only); Best Callback Phone Number: [phone].  Patient calling about lantus dose.  States got a call from Dr. George Hugh nurse 04/20/12 and was told to increase her Lantus from 225 u to 250 u daily.  On 04/20/12  states her blood sugar reading was 103 before breakfast.  States after a regular bowl of cereal, her blood sugar  was only 119.  States A1C 04/19/12 was 8.1.  Concerned that her BS will be too low when first arising if she takes 250u, but states that lower blood sugar readings occur on days when she forgets to take her lantus until very late in the day.  Advised to take lantus when she takes her other medications at 1030 or 1100 daily so as not to forget it.  To try that; will call 04/22/12 and give update on AM blood sugar.

## 2012-04-20 NOTE — Telephone Encounter (Signed)
Pain med - Done hardcopy to robin

## 2012-04-20 NOTE — Telephone Encounter (Signed)
I think she sees Dr Everardo All primarily for her sugar;  Ok to forward note to Endo

## 2012-04-21 NOTE — Telephone Encounter (Signed)
Faxed hardcopy to pharmacy and forwarded note to Endo.

## 2012-04-22 ENCOUNTER — Telehealth: Payer: Self-pay | Admitting: *Deleted

## 2012-04-22 DIAGNOSIS — K219 Gastro-esophageal reflux disease without esophagitis: Secondary | ICD-10-CM | POA: Diagnosis not present

## 2012-04-22 DIAGNOSIS — I10 Essential (primary) hypertension: Secondary | ICD-10-CM | POA: Diagnosis not present

## 2012-04-22 DIAGNOSIS — G4733 Obstructive sleep apnea (adult) (pediatric): Secondary | ICD-10-CM | POA: Diagnosis not present

## 2012-04-22 DIAGNOSIS — J449 Chronic obstructive pulmonary disease, unspecified: Secondary | ICD-10-CM | POA: Diagnosis not present

## 2012-04-22 DIAGNOSIS — IMO0002 Reserved for concepts with insufficient information to code with codable children: Secondary | ICD-10-CM | POA: Diagnosis not present

## 2012-04-22 DIAGNOSIS — E119 Type 2 diabetes mellitus without complications: Secondary | ICD-10-CM | POA: Diagnosis not present

## 2012-04-22 NOTE — Telephone Encounter (Signed)
Pt called and stated at 11:20 am she checked her blood glucose and it was 194. Pt then took 250 units of lantus. Pt started feeling clammy, checked her blood glucose and it was 309. Pt has not taken lantus again. Pt not feeling well. Please advise.

## 2012-04-22 NOTE — Telephone Encounter (Signed)
Called pt to tell her to continue the same. Pt stated she hasn't eaten much today. Advised pt to eat properly. Pt ate breakfast late and it is now 2:52 pm and pt has not eaten lunch. Advised pt to eat. If there is no change in her blood glucose before 5:00 pm today, to call us back.

## 2012-04-22 NOTE — Telephone Encounter (Signed)
Please continue the same 

## 2012-04-25 DIAGNOSIS — K219 Gastro-esophageal reflux disease without esophagitis: Secondary | ICD-10-CM | POA: Diagnosis not present

## 2012-04-25 DIAGNOSIS — J449 Chronic obstructive pulmonary disease, unspecified: Secondary | ICD-10-CM | POA: Diagnosis not present

## 2012-04-25 DIAGNOSIS — G4733 Obstructive sleep apnea (adult) (pediatric): Secondary | ICD-10-CM | POA: Diagnosis not present

## 2012-04-25 DIAGNOSIS — IMO0002 Reserved for concepts with insufficient information to code with codable children: Secondary | ICD-10-CM | POA: Diagnosis not present

## 2012-04-25 DIAGNOSIS — E119 Type 2 diabetes mellitus without complications: Secondary | ICD-10-CM | POA: Diagnosis not present

## 2012-04-25 DIAGNOSIS — I10 Essential (primary) hypertension: Secondary | ICD-10-CM | POA: Diagnosis not present

## 2012-04-26 ENCOUNTER — Telehealth: Payer: Self-pay

## 2012-04-26 DIAGNOSIS — K219 Gastro-esophageal reflux disease without esophagitis: Secondary | ICD-10-CM | POA: Diagnosis not present

## 2012-04-26 DIAGNOSIS — G4733 Obstructive sleep apnea (adult) (pediatric): Secondary | ICD-10-CM | POA: Diagnosis not present

## 2012-04-26 DIAGNOSIS — I10 Essential (primary) hypertension: Secondary | ICD-10-CM | POA: Diagnosis not present

## 2012-04-26 DIAGNOSIS — E119 Type 2 diabetes mellitus without complications: Secondary | ICD-10-CM | POA: Diagnosis not present

## 2012-04-26 DIAGNOSIS — IMO0002 Reserved for concepts with insufficient information to code with codable children: Secondary | ICD-10-CM | POA: Diagnosis not present

## 2012-04-26 DIAGNOSIS — J449 Chronic obstructive pulmonary disease, unspecified: Secondary | ICD-10-CM | POA: Diagnosis not present

## 2012-04-26 MED ORDER — IRBESARTAN 300 MG PO TABS: 300.0000 mg | ORAL_TABLET | Freq: Every day | ORAL | Status: DC

## 2012-04-26 MED ORDER — POTASSIUM CHLORIDE ER 10 MEQ PO TBCR: 10.0000 meq | EXTENDED_RELEASE_TABLET | Freq: Two times a day (BID) | ORAL | Status: DC

## 2012-04-26 NOTE — Telephone Encounter (Signed)
Refilled #90 day per pt. Request.

## 2012-04-27 DIAGNOSIS — J449 Chronic obstructive pulmonary disease, unspecified: Secondary | ICD-10-CM | POA: Diagnosis not present

## 2012-04-27 DIAGNOSIS — E119 Type 2 diabetes mellitus without complications: Secondary | ICD-10-CM | POA: Diagnosis not present

## 2012-04-27 DIAGNOSIS — K219 Gastro-esophageal reflux disease without esophagitis: Secondary | ICD-10-CM | POA: Diagnosis not present

## 2012-04-27 DIAGNOSIS — IMO0002 Reserved for concepts with insufficient information to code with codable children: Secondary | ICD-10-CM | POA: Diagnosis not present

## 2012-04-27 DIAGNOSIS — G4733 Obstructive sleep apnea (adult) (pediatric): Secondary | ICD-10-CM | POA: Diagnosis not present

## 2012-04-27 DIAGNOSIS — I10 Essential (primary) hypertension: Secondary | ICD-10-CM | POA: Diagnosis not present

## 2012-04-28 DIAGNOSIS — E119 Type 2 diabetes mellitus without complications: Secondary | ICD-10-CM | POA: Diagnosis not present

## 2012-04-28 DIAGNOSIS — I10 Essential (primary) hypertension: Secondary | ICD-10-CM | POA: Diagnosis not present

## 2012-04-28 DIAGNOSIS — K219 Gastro-esophageal reflux disease without esophagitis: Secondary | ICD-10-CM | POA: Diagnosis not present

## 2012-04-28 DIAGNOSIS — IMO0002 Reserved for concepts with insufficient information to code with codable children: Secondary | ICD-10-CM | POA: Diagnosis not present

## 2012-04-28 DIAGNOSIS — G4733 Obstructive sleep apnea (adult) (pediatric): Secondary | ICD-10-CM | POA: Diagnosis not present

## 2012-04-28 DIAGNOSIS — J449 Chronic obstructive pulmonary disease, unspecified: Secondary | ICD-10-CM | POA: Diagnosis not present

## 2012-04-29 DIAGNOSIS — J449 Chronic obstructive pulmonary disease, unspecified: Secondary | ICD-10-CM | POA: Diagnosis not present

## 2012-04-29 DIAGNOSIS — I10 Essential (primary) hypertension: Secondary | ICD-10-CM | POA: Diagnosis not present

## 2012-04-29 DIAGNOSIS — IMO0002 Reserved for concepts with insufficient information to code with codable children: Secondary | ICD-10-CM | POA: Diagnosis not present

## 2012-04-29 DIAGNOSIS — E119 Type 2 diabetes mellitus without complications: Secondary | ICD-10-CM | POA: Diagnosis not present

## 2012-04-29 DIAGNOSIS — G4733 Obstructive sleep apnea (adult) (pediatric): Secondary | ICD-10-CM | POA: Diagnosis not present

## 2012-04-29 DIAGNOSIS — K219 Gastro-esophageal reflux disease without esophagitis: Secondary | ICD-10-CM | POA: Diagnosis not present

## 2012-05-03 DIAGNOSIS — K219 Gastro-esophageal reflux disease without esophagitis: Secondary | ICD-10-CM | POA: Diagnosis not present

## 2012-05-03 DIAGNOSIS — E119 Type 2 diabetes mellitus without complications: Secondary | ICD-10-CM | POA: Diagnosis not present

## 2012-05-03 DIAGNOSIS — IMO0002 Reserved for concepts with insufficient information to code with codable children: Secondary | ICD-10-CM | POA: Diagnosis not present

## 2012-05-03 DIAGNOSIS — I10 Essential (primary) hypertension: Secondary | ICD-10-CM | POA: Diagnosis not present

## 2012-05-03 DIAGNOSIS — G4733 Obstructive sleep apnea (adult) (pediatric): Secondary | ICD-10-CM | POA: Diagnosis not present

## 2012-05-03 DIAGNOSIS — J449 Chronic obstructive pulmonary disease, unspecified: Secondary | ICD-10-CM | POA: Diagnosis not present

## 2012-05-04 DIAGNOSIS — G4733 Obstructive sleep apnea (adult) (pediatric): Secondary | ICD-10-CM | POA: Diagnosis not present

## 2012-05-04 DIAGNOSIS — K219 Gastro-esophageal reflux disease without esophagitis: Secondary | ICD-10-CM | POA: Diagnosis not present

## 2012-05-04 DIAGNOSIS — J449 Chronic obstructive pulmonary disease, unspecified: Secondary | ICD-10-CM | POA: Diagnosis not present

## 2012-05-04 DIAGNOSIS — E119 Type 2 diabetes mellitus without complications: Secondary | ICD-10-CM | POA: Diagnosis not present

## 2012-05-04 DIAGNOSIS — I10 Essential (primary) hypertension: Secondary | ICD-10-CM | POA: Diagnosis not present

## 2012-05-04 DIAGNOSIS — IMO0002 Reserved for concepts with insufficient information to code with codable children: Secondary | ICD-10-CM | POA: Diagnosis not present

## 2012-05-05 DIAGNOSIS — IMO0002 Reserved for concepts with insufficient information to code with codable children: Secondary | ICD-10-CM | POA: Diagnosis not present

## 2012-05-05 DIAGNOSIS — I10 Essential (primary) hypertension: Secondary | ICD-10-CM | POA: Diagnosis not present

## 2012-05-05 DIAGNOSIS — G4733 Obstructive sleep apnea (adult) (pediatric): Secondary | ICD-10-CM | POA: Diagnosis not present

## 2012-05-05 DIAGNOSIS — K219 Gastro-esophageal reflux disease without esophagitis: Secondary | ICD-10-CM | POA: Diagnosis not present

## 2012-05-05 DIAGNOSIS — J449 Chronic obstructive pulmonary disease, unspecified: Secondary | ICD-10-CM | POA: Diagnosis not present

## 2012-05-05 DIAGNOSIS — E119 Type 2 diabetes mellitus without complications: Secondary | ICD-10-CM | POA: Diagnosis not present

## 2012-05-06 DIAGNOSIS — G4733 Obstructive sleep apnea (adult) (pediatric): Secondary | ICD-10-CM | POA: Diagnosis not present

## 2012-05-06 DIAGNOSIS — K219 Gastro-esophageal reflux disease without esophagitis: Secondary | ICD-10-CM | POA: Diagnosis not present

## 2012-05-06 DIAGNOSIS — J449 Chronic obstructive pulmonary disease, unspecified: Secondary | ICD-10-CM | POA: Diagnosis not present

## 2012-05-06 DIAGNOSIS — E119 Type 2 diabetes mellitus without complications: Secondary | ICD-10-CM | POA: Diagnosis not present

## 2012-05-06 DIAGNOSIS — I10 Essential (primary) hypertension: Secondary | ICD-10-CM | POA: Diagnosis not present

## 2012-05-06 DIAGNOSIS — IMO0002 Reserved for concepts with insufficient information to code with codable children: Secondary | ICD-10-CM | POA: Diagnosis not present

## 2012-05-09 DIAGNOSIS — I10 Essential (primary) hypertension: Secondary | ICD-10-CM | POA: Diagnosis not present

## 2012-05-09 DIAGNOSIS — IMO0002 Reserved for concepts with insufficient information to code with codable children: Secondary | ICD-10-CM | POA: Diagnosis not present

## 2012-05-09 DIAGNOSIS — G4733 Obstructive sleep apnea (adult) (pediatric): Secondary | ICD-10-CM | POA: Diagnosis not present

## 2012-05-09 DIAGNOSIS — E119 Type 2 diabetes mellitus without complications: Secondary | ICD-10-CM | POA: Diagnosis not present

## 2012-05-09 DIAGNOSIS — J449 Chronic obstructive pulmonary disease, unspecified: Secondary | ICD-10-CM | POA: Diagnosis not present

## 2012-05-09 DIAGNOSIS — K219 Gastro-esophageal reflux disease without esophagitis: Secondary | ICD-10-CM | POA: Diagnosis not present

## 2012-05-10 DIAGNOSIS — G4733 Obstructive sleep apnea (adult) (pediatric): Secondary | ICD-10-CM | POA: Diagnosis not present

## 2012-05-10 DIAGNOSIS — IMO0002 Reserved for concepts with insufficient information to code with codable children: Secondary | ICD-10-CM

## 2012-05-10 DIAGNOSIS — I10 Essential (primary) hypertension: Secondary | ICD-10-CM

## 2012-05-10 DIAGNOSIS — E119 Type 2 diabetes mellitus without complications: Secondary | ICD-10-CM

## 2012-05-10 DIAGNOSIS — K219 Gastro-esophageal reflux disease without esophagitis: Secondary | ICD-10-CM | POA: Diagnosis not present

## 2012-05-10 DIAGNOSIS — J449 Chronic obstructive pulmonary disease, unspecified: Secondary | ICD-10-CM

## 2012-05-11 DIAGNOSIS — G4733 Obstructive sleep apnea (adult) (pediatric): Secondary | ICD-10-CM | POA: Diagnosis not present

## 2012-05-11 DIAGNOSIS — J449 Chronic obstructive pulmonary disease, unspecified: Secondary | ICD-10-CM | POA: Diagnosis not present

## 2012-05-11 DIAGNOSIS — IMO0002 Reserved for concepts with insufficient information to code with codable children: Secondary | ICD-10-CM | POA: Diagnosis not present

## 2012-05-11 DIAGNOSIS — I10 Essential (primary) hypertension: Secondary | ICD-10-CM | POA: Diagnosis not present

## 2012-05-11 DIAGNOSIS — E119 Type 2 diabetes mellitus without complications: Secondary | ICD-10-CM | POA: Diagnosis not present

## 2012-05-11 DIAGNOSIS — K219 Gastro-esophageal reflux disease without esophagitis: Secondary | ICD-10-CM | POA: Diagnosis not present

## 2012-05-12 DIAGNOSIS — J449 Chronic obstructive pulmonary disease, unspecified: Secondary | ICD-10-CM | POA: Diagnosis not present

## 2012-05-12 DIAGNOSIS — I10 Essential (primary) hypertension: Secondary | ICD-10-CM | POA: Diagnosis not present

## 2012-05-12 DIAGNOSIS — IMO0002 Reserved for concepts with insufficient information to code with codable children: Secondary | ICD-10-CM | POA: Diagnosis not present

## 2012-05-12 DIAGNOSIS — K219 Gastro-esophageal reflux disease without esophagitis: Secondary | ICD-10-CM | POA: Diagnosis not present

## 2012-05-12 DIAGNOSIS — E119 Type 2 diabetes mellitus without complications: Secondary | ICD-10-CM | POA: Diagnosis not present

## 2012-05-12 DIAGNOSIS — G4733 Obstructive sleep apnea (adult) (pediatric): Secondary | ICD-10-CM | POA: Diagnosis not present

## 2012-05-13 DIAGNOSIS — K219 Gastro-esophageal reflux disease without esophagitis: Secondary | ICD-10-CM | POA: Diagnosis not present

## 2012-05-13 DIAGNOSIS — IMO0002 Reserved for concepts with insufficient information to code with codable children: Secondary | ICD-10-CM | POA: Diagnosis not present

## 2012-05-13 DIAGNOSIS — G4733 Obstructive sleep apnea (adult) (pediatric): Secondary | ICD-10-CM | POA: Diagnosis not present

## 2012-05-13 DIAGNOSIS — J449 Chronic obstructive pulmonary disease, unspecified: Secondary | ICD-10-CM | POA: Diagnosis not present

## 2012-05-13 DIAGNOSIS — I10 Essential (primary) hypertension: Secondary | ICD-10-CM | POA: Diagnosis not present

## 2012-05-13 DIAGNOSIS — E119 Type 2 diabetes mellitus without complications: Secondary | ICD-10-CM | POA: Diagnosis not present

## 2012-05-16 DIAGNOSIS — IMO0002 Reserved for concepts with insufficient information to code with codable children: Secondary | ICD-10-CM | POA: Diagnosis not present

## 2012-05-16 DIAGNOSIS — I10 Essential (primary) hypertension: Secondary | ICD-10-CM | POA: Diagnosis not present

## 2012-05-16 DIAGNOSIS — J449 Chronic obstructive pulmonary disease, unspecified: Secondary | ICD-10-CM | POA: Diagnosis not present

## 2012-05-16 DIAGNOSIS — K219 Gastro-esophageal reflux disease without esophagitis: Secondary | ICD-10-CM | POA: Diagnosis not present

## 2012-05-16 DIAGNOSIS — E119 Type 2 diabetes mellitus without complications: Secondary | ICD-10-CM | POA: Diagnosis not present

## 2012-05-16 DIAGNOSIS — G4733 Obstructive sleep apnea (adult) (pediatric): Secondary | ICD-10-CM | POA: Diagnosis not present

## 2012-05-17 DIAGNOSIS — G4733 Obstructive sleep apnea (adult) (pediatric): Secondary | ICD-10-CM | POA: Diagnosis not present

## 2012-05-17 DIAGNOSIS — E119 Type 2 diabetes mellitus without complications: Secondary | ICD-10-CM | POA: Diagnosis not present

## 2012-05-17 DIAGNOSIS — I10 Essential (primary) hypertension: Secondary | ICD-10-CM | POA: Diagnosis not present

## 2012-05-17 DIAGNOSIS — K219 Gastro-esophageal reflux disease without esophagitis: Secondary | ICD-10-CM | POA: Diagnosis not present

## 2012-05-17 DIAGNOSIS — J449 Chronic obstructive pulmonary disease, unspecified: Secondary | ICD-10-CM | POA: Diagnosis not present

## 2012-05-17 DIAGNOSIS — IMO0002 Reserved for concepts with insufficient information to code with codable children: Secondary | ICD-10-CM | POA: Diagnosis not present

## 2012-05-18 DIAGNOSIS — G4733 Obstructive sleep apnea (adult) (pediatric): Secondary | ICD-10-CM | POA: Diagnosis not present

## 2012-05-18 DIAGNOSIS — I10 Essential (primary) hypertension: Secondary | ICD-10-CM | POA: Diagnosis not present

## 2012-05-18 DIAGNOSIS — IMO0002 Reserved for concepts with insufficient information to code with codable children: Secondary | ICD-10-CM | POA: Diagnosis not present

## 2012-05-18 DIAGNOSIS — E119 Type 2 diabetes mellitus without complications: Secondary | ICD-10-CM | POA: Diagnosis not present

## 2012-05-18 DIAGNOSIS — J449 Chronic obstructive pulmonary disease, unspecified: Secondary | ICD-10-CM | POA: Diagnosis not present

## 2012-05-18 DIAGNOSIS — K219 Gastro-esophageal reflux disease without esophagitis: Secondary | ICD-10-CM | POA: Diagnosis not present

## 2012-05-20 DIAGNOSIS — IMO0002 Reserved for concepts with insufficient information to code with codable children: Secondary | ICD-10-CM | POA: Diagnosis not present

## 2012-05-20 DIAGNOSIS — E119 Type 2 diabetes mellitus without complications: Secondary | ICD-10-CM | POA: Diagnosis not present

## 2012-05-20 DIAGNOSIS — K219 Gastro-esophageal reflux disease without esophagitis: Secondary | ICD-10-CM | POA: Diagnosis not present

## 2012-05-20 DIAGNOSIS — G4733 Obstructive sleep apnea (adult) (pediatric): Secondary | ICD-10-CM | POA: Diagnosis not present

## 2012-05-20 DIAGNOSIS — J449 Chronic obstructive pulmonary disease, unspecified: Secondary | ICD-10-CM | POA: Diagnosis not present

## 2012-05-20 DIAGNOSIS — I10 Essential (primary) hypertension: Secondary | ICD-10-CM | POA: Diagnosis not present

## 2012-05-24 DIAGNOSIS — G4733 Obstructive sleep apnea (adult) (pediatric): Secondary | ICD-10-CM | POA: Diagnosis not present

## 2012-05-24 DIAGNOSIS — IMO0002 Reserved for concepts with insufficient information to code with codable children: Secondary | ICD-10-CM | POA: Diagnosis not present

## 2012-05-24 DIAGNOSIS — J449 Chronic obstructive pulmonary disease, unspecified: Secondary | ICD-10-CM | POA: Diagnosis not present

## 2012-05-24 DIAGNOSIS — I10 Essential (primary) hypertension: Secondary | ICD-10-CM | POA: Diagnosis not present

## 2012-05-24 DIAGNOSIS — K219 Gastro-esophageal reflux disease without esophagitis: Secondary | ICD-10-CM | POA: Diagnosis not present

## 2012-05-24 DIAGNOSIS — E119 Type 2 diabetes mellitus without complications: Secondary | ICD-10-CM | POA: Diagnosis not present

## 2012-05-25 ENCOUNTER — Ambulatory Visit: Payer: Medicare Other | Admitting: Internal Medicine

## 2012-05-25 DIAGNOSIS — I10 Essential (primary) hypertension: Secondary | ICD-10-CM | POA: Diagnosis not present

## 2012-05-25 DIAGNOSIS — IMO0002 Reserved for concepts with insufficient information to code with codable children: Secondary | ICD-10-CM | POA: Diagnosis not present

## 2012-05-25 DIAGNOSIS — G4733 Obstructive sleep apnea (adult) (pediatric): Secondary | ICD-10-CM | POA: Diagnosis not present

## 2012-05-25 DIAGNOSIS — E119 Type 2 diabetes mellitus without complications: Secondary | ICD-10-CM | POA: Diagnosis not present

## 2012-05-25 DIAGNOSIS — J449 Chronic obstructive pulmonary disease, unspecified: Secondary | ICD-10-CM | POA: Diagnosis not present

## 2012-05-25 DIAGNOSIS — K219 Gastro-esophageal reflux disease without esophagitis: Secondary | ICD-10-CM | POA: Diagnosis not present

## 2012-05-26 ENCOUNTER — Ambulatory Visit: Payer: Medicare Other | Admitting: Internal Medicine

## 2012-05-26 DIAGNOSIS — E119 Type 2 diabetes mellitus without complications: Secondary | ICD-10-CM | POA: Diagnosis not present

## 2012-05-26 DIAGNOSIS — IMO0002 Reserved for concepts with insufficient information to code with codable children: Secondary | ICD-10-CM | POA: Diagnosis not present

## 2012-05-26 DIAGNOSIS — G4733 Obstructive sleep apnea (adult) (pediatric): Secondary | ICD-10-CM | POA: Diagnosis not present

## 2012-05-26 DIAGNOSIS — K219 Gastro-esophageal reflux disease without esophagitis: Secondary | ICD-10-CM | POA: Diagnosis not present

## 2012-05-26 DIAGNOSIS — Z0289 Encounter for other administrative examinations: Secondary | ICD-10-CM

## 2012-05-26 DIAGNOSIS — J449 Chronic obstructive pulmonary disease, unspecified: Secondary | ICD-10-CM | POA: Diagnosis not present

## 2012-05-26 DIAGNOSIS — I10 Essential (primary) hypertension: Secondary | ICD-10-CM | POA: Diagnosis not present

## 2012-05-30 ENCOUNTER — Ambulatory Visit: Payer: Medicare Other | Admitting: Internal Medicine

## 2012-05-30 ENCOUNTER — Telehealth: Payer: Self-pay

## 2012-05-30 DIAGNOSIS — K219 Gastro-esophageal reflux disease without esophagitis: Secondary | ICD-10-CM | POA: Diagnosis not present

## 2012-05-30 DIAGNOSIS — G4733 Obstructive sleep apnea (adult) (pediatric): Secondary | ICD-10-CM | POA: Diagnosis not present

## 2012-05-30 DIAGNOSIS — J449 Chronic obstructive pulmonary disease, unspecified: Secondary | ICD-10-CM | POA: Diagnosis not present

## 2012-05-30 DIAGNOSIS — IMO0002 Reserved for concepts with insufficient information to code with codable children: Secondary | ICD-10-CM | POA: Diagnosis not present

## 2012-05-30 DIAGNOSIS — Z0289 Encounter for other administrative examinations: Secondary | ICD-10-CM

## 2012-05-30 DIAGNOSIS — I10 Essential (primary) hypertension: Secondary | ICD-10-CM | POA: Diagnosis not present

## 2012-05-30 DIAGNOSIS — E119 Type 2 diabetes mellitus without complications: Secondary | ICD-10-CM | POA: Diagnosis not present

## 2012-05-30 NOTE — Telephone Encounter (Signed)
Ok for verbal 

## 2012-05-30 NOTE — Telephone Encounter (Signed)
[facility] called requesting verbal order to continue bath aide 2/week x 3 weeks and to continue OT 2/week x 2 weeks

## 2012-05-30 NOTE — Telephone Encounter (Signed)
HHRN informed 

## 2012-06-01 ENCOUNTER — Other Ambulatory Visit: Payer: Self-pay

## 2012-06-01 DIAGNOSIS — J449 Chronic obstructive pulmonary disease, unspecified: Secondary | ICD-10-CM | POA: Diagnosis not present

## 2012-06-01 DIAGNOSIS — I10 Essential (primary) hypertension: Secondary | ICD-10-CM | POA: Diagnosis not present

## 2012-06-01 DIAGNOSIS — E119 Type 2 diabetes mellitus without complications: Secondary | ICD-10-CM | POA: Diagnosis not present

## 2012-06-01 DIAGNOSIS — K219 Gastro-esophageal reflux disease without esophagitis: Secondary | ICD-10-CM | POA: Diagnosis not present

## 2012-06-01 DIAGNOSIS — G4733 Obstructive sleep apnea (adult) (pediatric): Secondary | ICD-10-CM | POA: Diagnosis not present

## 2012-06-01 DIAGNOSIS — IMO0002 Reserved for concepts with insufficient information to code with codable children: Secondary | ICD-10-CM | POA: Diagnosis not present

## 2012-06-01 MED ORDER — HYDROCODONE-ACETAMINOPHEN 5-325 MG PO TABS: 1.0000 | ORAL_TABLET | Freq: Four times a day (QID) | ORAL | Status: DC | PRN

## 2012-06-01 NOTE — Telephone Encounter (Signed)
Faxed hardcopy to pharmacy. 

## 2012-06-01 NOTE — Telephone Encounter (Signed)
Done hardcopy to robin  

## 2012-06-02 DIAGNOSIS — G4733 Obstructive sleep apnea (adult) (pediatric): Secondary | ICD-10-CM | POA: Diagnosis not present

## 2012-06-02 DIAGNOSIS — IMO0002 Reserved for concepts with insufficient information to code with codable children: Secondary | ICD-10-CM | POA: Diagnosis not present

## 2012-06-02 DIAGNOSIS — K219 Gastro-esophageal reflux disease without esophagitis: Secondary | ICD-10-CM | POA: Diagnosis not present

## 2012-06-02 DIAGNOSIS — J449 Chronic obstructive pulmonary disease, unspecified: Secondary | ICD-10-CM | POA: Diagnosis not present

## 2012-06-02 DIAGNOSIS — E119 Type 2 diabetes mellitus without complications: Secondary | ICD-10-CM | POA: Diagnosis not present

## 2012-06-02 DIAGNOSIS — I10 Essential (primary) hypertension: Secondary | ICD-10-CM | POA: Diagnosis not present

## 2012-06-06 DIAGNOSIS — J449 Chronic obstructive pulmonary disease, unspecified: Secondary | ICD-10-CM | POA: Diagnosis not present

## 2012-06-06 DIAGNOSIS — IMO0002 Reserved for concepts with insufficient information to code with codable children: Secondary | ICD-10-CM | POA: Diagnosis not present

## 2012-06-06 DIAGNOSIS — I10 Essential (primary) hypertension: Secondary | ICD-10-CM | POA: Diagnosis not present

## 2012-06-06 DIAGNOSIS — G4733 Obstructive sleep apnea (adult) (pediatric): Secondary | ICD-10-CM | POA: Diagnosis not present

## 2012-06-06 DIAGNOSIS — K219 Gastro-esophageal reflux disease without esophagitis: Secondary | ICD-10-CM | POA: Diagnosis not present

## 2012-06-06 DIAGNOSIS — E119 Type 2 diabetes mellitus without complications: Secondary | ICD-10-CM | POA: Diagnosis not present

## 2012-06-09 ENCOUNTER — Other Ambulatory Visit: Payer: Self-pay | Admitting: Internal Medicine

## 2012-06-13 ENCOUNTER — Telehealth: Payer: Self-pay

## 2012-06-13 NOTE — Telephone Encounter (Signed)
Patient informed and scheduled appt. For 06/14/12.

## 2012-06-13 NOTE — Telephone Encounter (Signed)
unfort unable to see today, I would normally recommend go to ER, but if she refuses, should consider UC, or be seen here in the am

## 2012-06-13 NOTE — Telephone Encounter (Signed)
[facility] at the patients home and she is having chest pain, body aches, eye and head pain.  She is unable to hold anything in her hand due to uncontrollable shaking.  Was informed to go to ER but patient refused and would like appointment.  Please advise.  Call back number [phone]

## 2012-06-14 ENCOUNTER — Encounter (HOSPITAL_COMMUNITY): Payer: Self-pay

## 2012-06-14 ENCOUNTER — Emergency Department (HOSPITAL_COMMUNITY): Payer: Medicare Other

## 2012-06-14 ENCOUNTER — Encounter: Payer: Self-pay | Admitting: Internal Medicine

## 2012-06-14 ENCOUNTER — Ambulatory Visit (INDEPENDENT_AMBULATORY_CARE_PROVIDER_SITE_OTHER): Payer: Medicare Other | Admitting: Internal Medicine

## 2012-06-14 ENCOUNTER — Emergency Department (HOSPITAL_COMMUNITY)
Admission: EM | Admit: 2012-06-14 | Discharge: 2012-06-14 | Disposition: A | Discharge status: Home / Self Care | Payer: Medicare Other | Attending: Emergency Medicine | Admitting: Emergency Medicine

## 2012-06-14 VITALS — BP 120/52 | HR 119 | Temp 101.7°F

## 2012-06-14 DIAGNOSIS — R509 Fever, unspecified: Secondary | ICD-10-CM | POA: Diagnosis not present

## 2012-06-14 DIAGNOSIS — R52 Pain, unspecified: Secondary | ICD-10-CM | POA: Diagnosis not present

## 2012-06-14 DIAGNOSIS — E878 Other disorders of electrolyte and fluid balance, not elsewhere classified: Secondary | ICD-10-CM | POA: Insufficient documentation

## 2012-06-14 DIAGNOSIS — E871 Hypo-osmolality and hyponatremia: Secondary | ICD-10-CM | POA: Diagnosis not present

## 2012-06-14 DIAGNOSIS — N390 Urinary tract infection, site not specified: Secondary | ICD-10-CM

## 2012-06-14 DIAGNOSIS — I10 Essential (primary) hypertension: Secondary | ICD-10-CM | POA: Insufficient documentation

## 2012-06-14 DIAGNOSIS — Z7982 Long term (current) use of aspirin: Secondary | ICD-10-CM | POA: Insufficient documentation

## 2012-06-14 DIAGNOSIS — G4730 Sleep apnea, unspecified: Secondary | ICD-10-CM | POA: Diagnosis not present

## 2012-06-14 DIAGNOSIS — R079 Chest pain, unspecified: Secondary | ICD-10-CM | POA: Diagnosis not present

## 2012-06-14 DIAGNOSIS — Z79899 Other long term (current) drug therapy: Secondary | ICD-10-CM | POA: Insufficient documentation

## 2012-06-14 DIAGNOSIS — Z87891 Personal history of nicotine dependence: Secondary | ICD-10-CM | POA: Diagnosis not present

## 2012-06-14 DIAGNOSIS — Z8709 Personal history of other diseases of the respiratory system: Secondary | ICD-10-CM | POA: Diagnosis not present

## 2012-06-14 DIAGNOSIS — E119 Type 2 diabetes mellitus without complications: Secondary | ICD-10-CM | POA: Insufficient documentation

## 2012-06-14 DIAGNOSIS — J989 Respiratory disorder, unspecified: Secondary | ICD-10-CM | POA: Insufficient documentation

## 2012-06-14 DIAGNOSIS — R42 Dizziness and giddiness: Secondary | ICD-10-CM | POA: Insufficient documentation

## 2012-06-14 DIAGNOSIS — Z794 Long term (current) use of insulin: Secondary | ICD-10-CM | POA: Insufficient documentation

## 2012-06-14 DIAGNOSIS — R05 Cough: Secondary | ICD-10-CM | POA: Diagnosis not present

## 2012-06-14 DIAGNOSIS — R0602 Shortness of breath: Secondary | ICD-10-CM | POA: Diagnosis not present

## 2012-06-14 LAB — GLUCOSE, CAPILLARY

## 2012-06-14 LAB — COMPREHENSIVE METABOLIC PANEL
Albumin: 2.7 g/dL — ABNORMAL LOW (ref 3.5–5.2)
Alkaline Phosphatase: 93 U/L (ref 39–117)
BUN: 31 mg/dL — ABNORMAL HIGH (ref 6–23)
CO2: 26 mEq/L (ref 19–32)
Chloride: 92 mEq/L — ABNORMAL LOW (ref 96–112)
Creatinine, Ser: 1.38 mg/dL — ABNORMAL HIGH (ref 0.50–1.10)
GFR calc Af Amer: 46 mL/min — ABNORMAL LOW (ref >90)
GFR calc non Af Amer: 40 mL/min — ABNORMAL LOW (ref >90)
Glucose, Bld: 223 mg/dL — ABNORMAL HIGH (ref 70–99)
Potassium: 4.6 mEq/L (ref 3.5–5.1)
Total Bilirubin: 0.4 mg/dL (ref 0.3–1.2)

## 2012-06-14 LAB — URINALYSIS, ROUTINE W REFLEX MICROSCOPIC
Bilirubin Urine: NEGATIVE
Leukocytes, UA: NEGATIVE
Nitrite: NEGATIVE
Specific Gravity, Urine: 1.022 (ref 1.005–1.030)
Urobilinogen, UA: 1 mg/dL (ref 0.0–1.0)
pH: 5.5 (ref 5.0–8.0)

## 2012-06-14 LAB — CBC
Platelets: 232 10*3/uL (ref 150–400)
RBC: 3.98 MIL/uL (ref 3.87–5.11)
RDW: 16.1 % — ABNORMAL HIGH (ref 11.5–15.5)
WBC: 10.8 10*3/uL — ABNORMAL HIGH (ref 4.0–10.5)

## 2012-06-14 LAB — URINE MICROSCOPIC-ADD ON

## 2012-06-14 MED ORDER — FLUCONAZOLE 200 MG PO TABS: 200.0000 mg | ORAL_TABLET | Freq: Every day | ORAL | Status: DC

## 2012-06-14 MED ORDER — SODIUM CHLORIDE 0.9 % IV BOLUS (SEPSIS): 1000.0000 mL | Freq: Once | INTRAVENOUS | Status: DC

## 2012-06-14 MED ORDER — MORPHINE SULFATE 4 MG/ML IJ SOLN
4.0000 mg | Freq: Once | INTRAMUSCULAR | Status: AC
  Administered 2012-06-14: 4 mg via INTRAVENOUS
  Filled 2012-06-14: qty 1

## 2012-06-14 MED ORDER — SODIUM CHLORIDE 0.9 % IV BOLUS (SEPSIS)
2000.0000 mL | Freq: Once | INTRAVENOUS | Status: AC
  Administered 2012-06-14: 2000 mL via INTRAVENOUS

## 2012-06-14 MED ORDER — LORAZEPAM 2 MG/ML IJ SOLN
1.0000 mg | Freq: Once | INTRAMUSCULAR | Status: AC
  Administered 2012-06-14: 1 mg via INTRAVENOUS
  Filled 2012-06-14: qty 1

## 2012-06-14 MED ORDER — ONDANSETRON HCL 4 MG/2ML IJ SOLN
4.0000 mg | Freq: Once | INTRAMUSCULAR | Status: AC
  Administered 2012-06-14: 4 mg via INTRAVENOUS
  Filled 2012-06-14: qty 2

## 2012-06-14 MED ORDER — FLUCONAZOLE 200 MG PO TABS
200.0000 mg | ORAL_TABLET | Freq: Once | ORAL | Status: AC
  Administered 2012-06-14: 200 mg via ORAL
  Filled 2012-06-14: qty 1

## 2012-06-14 NOTE — Progress Notes (Signed)
HOME HEALTH AGENCIES SERVING [place]   Agencies that are Medicare-Certified and are affiliated with [facility]  Telephone Number Address  [facility]   [facility] has ownership interest in this company; however, you are under no obligation to use this agency. [phone] or  [phone] [address] [place], [place] [address] [email or web address]   Agencies that are Medicare-Certified and are not affiliated with [facility]                                                                                 Home Health Agency Telephone Number Address  [facility] [phone] Fax [phone] [address], [address] [place], [place]  [address] [email or web address]  [facility] [phone] or [phone] Fax [phone] [address] [place], [place] [address] [email or web address]  [facility] [phone] Fax [phone] [address] [place], [place] [address] [email or web address]  [facility] [phone] Fax [phone] [address] [place], [place]  [address] [email or web address]  [facility] [phone] Fax [phone] [address], [address] [place], [place] [address] [email or web address]  [facility] [phone] [address] [place], [place] [address] [email or web address]  [facility] [phone]  [address] [place], [place] [address] [email or web address]  [facility] [phone] or [phone] Fax number [phone] [address] [place], [place]  [address] [email or web address]  [facility] [phone] Fax [phone] [address] [place], [place]  [address]  [facility]  [phone] Fax [phone] [address] [place], [place] [address] [email or web address]   

## 2012-06-14 NOTE — Patient Instructions (Addendum)
Due to your examination today with the fever, shortness of breath, weakness and falls, you should go now to the ER at [facility] for further evaluation and treatment, as you may have pneumonia that would require hospitalization Continue all other medications as before for now There was no specific treatment done at the office here today

## 2012-06-14 NOTE — ED Notes (Addendum)
Patient reports that she has been having generalized body aches, headache, a productive cough with green sputum, and weakness. Patient states that she has been taking OTC cough syrup with no relief. Patient also c/o headache.

## 2012-06-14 NOTE — Progress Notes (Signed)
Subjective:    Patient ID: Christy May, female    DOB: 1950-11-21, 61 y.o.   MRN: [id number]  HPI  Here with brother who provided transport, pt lives alone; pt had called yesterday with c/o CP, HA and myalgias and advised to go to ER, but she refused to come here, brother somewhat demanding she be admitted to hospital.  Pt not able to help with details of hx, seems somewhat confused and clearly weakened, brother states he thinks she has been ill for at least one wk, today the primary problem being sob, prod cough, fever, weak and shaking, has fallen at least once in the last few days and chest tightness/wheezing.   Past Medical History  Diagnosis Date  . Anemia, unspecified   . Extrinsic asthma, unspecified   . Chronic airway obstruction, not elsewhere classified   . Spondylosis of unspecified site without mention of myelopathy   . Dehydration   . Depressive disorder, not elsewhere classified   . Type II or unspecified type diabetes mellitus without mention of complication, not stated as uncontrolled   . Esophageal reflux   . Unspecified essential hypertension   . Unspecified menopausal and postmenopausal disorder   . Obesity, unspecified   . Inflammatory and toxic neuropathy, unspecified   . Unspecified sleep apnea   . Unspecified vitamin D deficiency   . Dysphagia 2007    historyof dysphagia with severe dysmotility by barium Everardo All   Past Surgical History  Procedure Date  . Laser sugery     bilateral eyes  . Tonsillectomy   . Back surgery     fusion-multiple cervical spine levels-Dr. Lovell Sheehan  . Excision of abcess     ?? Chest  . Cholecystectomy   . Dilation and curettage of uterus   . Exploratory laparotomy   . Partial hysterectomy   . Total knee arthroplasty 2005    Left  . Total knee arthroplasty 2005    Right- Dr. Turner Daniels  . Foot surgery     Right X 2    reports that she quit smoking about 25 years ago. She has never used smokeless tobacco. She  reports that she does not drink alcohol or use illicit drugs. family history includes Colon polyps in her brother; Diabetes in her sister and unspecified family member; Esophageal cancer in her brother and sister; Heart disease in her maternal grandfather; and Pancreatic cancer in her sister. Allergies  Allergen Reactions  . Ace Inhibitors Anaphylaxis  . Penicillins     Unknown   . Tape Hives   Current Outpatient Prescriptions on File Prior to Visit  Medication Sig Dispense Refill  . albuterol (PROVENTIL) (2.5 MG/3ML) 0.083% nebulizer solution Take 2.5 mg by nebulization every 6 (six) hours as needed. Respiratory distress      . aspirin 81 MG tablet Take 81 mg by mouth daily.        . Blood Glucose Monitoring Suppl (ONE TOUCH ULTRA MINI) W/DEVICE KIT 1 Device by Does not apply route once.      . cholecalciferol (VITAMIN D) 1000 UNITS tablet Take 2,000 Units by mouth daily.        . Fluticasone-Salmeterol (ADVAIR DISKUS) 100-50 MCG/DOSE AEPB Inhale 1 puff into the lungs every 12 (twelve) hours.        . gabapentin (NEURONTIN) 600 MG tablet Take 600 mg by mouth 3 (three) times daily.       Marland Kitchen glucose blood test strip 1 each by Other route as needed. Use as instructed  to check blood sugar twice daily dx 250.63      . HYDROcodone-acetaminophen (NORCO/VICODIN) 5-325 MG per tablet Take 1 tablet by mouth every 6 (six) hours as needed. Pain  60 tablet  1  . insulin glargine (LANTUS) 100 UNIT/ML injection Inject 250 Units into the skin daily. and syringes, 1 daily.      . irbesartan (AVAPRO) 300 MG tablet Take 1 tablet (300 mg total) by mouth daily.  90 tablet  3  . Lancets (ONETOUCH ULTRASOFT) lancets 1 each by Other route as needed. Use as instructed to check blood sugar twice daily dx 250.63      . metoprolol (LOPRESSOR) 50 MG tablet Take 75 mg by mouth 2 (two) times daily.       Marland Kitchen omeprazole (PRILOSEC) 20 MG capsule Take 20 mg by mouth 2 (two) times daily.      . polyethylene glycol (MIRALAX /  GLYCOLAX) packet Take 17 g by mouth daily as needed. For constipation.      . potassium chloride (K-DUR) 10 MEQ tablet Take 1 tablet (10 mEq total) by mouth 2 (two) times daily.  180 tablet  3  . promethazine (PHENERGAN) 25 MG tablet Take 25 mg by mouth every 6 (six) hours as needed. Nausea      . solifenacin (VESICARE) 10 MG tablet Take 10 mg by mouth daily.       . traMADol (ULTRAM-ER) 300 MG 24 hr tablet Take 300 mg by mouth daily.       Review of Systems Pt somewhat confused and weak - declines to answer     Objective:   Physical Exam BP 120/52  Pulse 119  Temp 101.7 F (38.7 C) (Oral)  SpO2 93% Physical Exam  VS noted, mod ill appearing, weak, can sit up in wheelchair but declines to try to stand or get on exam table Constitutional: Pt appears well-developed and well-nourished.  HENT: Head: Normocephalic.  Right Ear: External ear normal.  Left Ear: External ear normal.  Eyes: Conjunctivae and EOM are normal. Pupils are equal, round, and reactive to light.  Neck: Normal range of motion. Neck supple.  Cardiovascular: Normal rate and regular rhythm.   Pulmonary/Chest: Effort normal and breath sounds decreased bilat, trace wheezes, no rales.  Abd:  Soft, NT, non-distended, + BS Neurological: Pt is alert.Follow simple commands Skin: Skin is warm. No erythema. No rash.     Assessment & Plan:

## 2012-06-14 NOTE — Progress Notes (Signed)
[facility] ED CM noted in [place] that pt has 3 other entries that need to be merged CM spoke [place] in ED registration to request charts be merged

## 2012-06-14 NOTE — Assessment & Plan Note (Signed)
Will need cbg's, control unclear at this time, could contribute to low volume

## 2012-06-14 NOTE — Progress Notes (Signed)
[facility] ED CM consulted by ED patient advocate after pt states she did not want to return home Pt Lives alone. CM spoke with pt to assess her need. Pt confirmed she lives alone Has her brother and his female friend at beside. Pt states she went to pcp office Dr Christy May Ruiz and sent her to [facility] ED  Pt directed Cm to "paperwork" from Dr Christy May Ruiz that stated he wanted pt to come to ED to see if she possibly has pneumonia EPIC labs/imaging ruled out pneumonia.  CM present when ED PA-C, Tiburcio Pea came to discuss the results of pt ED labs, imaging, tests and scheduled treatment for results. Pt made attempts to explain s/s she experienced this morning but not very clear. ED level of care discussed and recommendations for f/u with pcp and/or specialist encouraged.  Brother's girlfriend left room x 1.  Brother voiced understanding and agreed to stay with pt overnight or prn. Pt confirmed she would be discharging to her home and follow up with pcp CM reviewed medicare guidelines for admission and re inforced what the PA-C informed her about needing a 3 day qualifying inpatient admission stay as a medicare pt.  CM reviewed home health services Pt [facility] (seen on Monday, June 13 2012 and scheduled to be seen again on Thursday June 16 2012 per pt), HHPT and a home health aide.  Pt wants these services resumed.  CM reviewed private duty nursing services Pt does not have medicaid and made aware there may be an out of pocket expense.  Pt states she do not prefer private duty. CM discussed Assisted living facility Pt does not have medicaid and made aware there may be an out of pocket expense. Pt states she do not prefer assisted living CM discussed snf and that she had the option to private pay for snf or to get assist from pcp and a home health SW ordered by Dr Christy May Ruiz to complete process for snf if she met requirements Pt and brother voiced understanding and provided with copies of [place] health agencies, private duty services and snfs.  ED PAC updated CM signing off

## 2012-06-14 NOTE — Assessment & Plan Note (Signed)
1 wk acute onset with weakness and recent fall, sob - cant r/o pna with copd exac, mod ill and cannot take care of herself at home, brother states he and family o/w not able to support;  Pt needs to go to ER for further eval and tx now

## 2012-06-14 NOTE — ED Notes (Signed)
Patient could not finish last part of test due to week bladder.

## 2012-06-14 NOTE — ED Provider Notes (Signed)
Medical screening examination/treatment/procedure(s) were performed by non-physician practitioner and as supervising physician I was immediately available for consultation/collaboration.    Jedadiah Abdallah R Lilee Aldea, MD 06/14/12 2239 

## 2012-06-14 NOTE — Progress Notes (Signed)
During CM interaction with pt she began to discuss " they brought bugs in to me" CM inquired who they were Pt stated the home health staff were sick and the "children came to visit" "I was doing well but then got set back" Brother clarified with pt that she was referring to a "cold, virus or flu" and pt states "yes"

## 2012-06-14 NOTE — Assessment & Plan Note (Signed)
stable overall by hx and exam, most recent data reviewed with pt, and pt to continue medical treatment as before BP Readings from Last 3 Encounters:  06/14/12 120/52  04/18/12 110/68  04/17/12 131/68

## 2012-06-14 NOTE — ED Provider Notes (Signed)
History     CSN: [id number]  Arrival date & time 06/14/12  1323   First MD Initiated Contact with Patient 06/14/12 1436      Chief Complaint  Patient presents with  . Cough  . Weakness  . Generalized Body Aches    (Consider location/radiation/quality/duration/timing/severity/associated sxs/prior treatment) HPI 61 year old morbidly obese female presents to emergency department with chief complaint of illness and abnormal movements.  Patient states that she has been feeling like she "caught a bug" for the past week.  Description of her symptoms is quite vague other than some pain in her chest and head and achiness all over her body.  She states that she has had a cough with production.  She denies rhinorrhea sore throat otalgia.  Patient denies crushing chest pain or pressure .  Patient is a diabetic and states that when giving herself her medication this morning her arms began flailing about and throwing her medications across the room.  She states that the movements were beyond her control.  When describing the symptoms she states "it throws it around the room." When asked what it is she states "my arms."  Has some abdominal pain, diffuse.  Last BM this morning. Denies fevers, chills.. Denies DOE, SOB, chest tightness or pressure, radiation to left arm, jaw or back, or diaphoresis. Denies dysuria, flank pain, suprapubic pain, frequency, urgency, or hematuria. Denies headaches, light headedness, weakness, visual disturbances.   Past Medical History  Diagnosis Date  . Obesity   . Hypertension   . Diabetes mellitus without complication   . Allergic bronchitis   . Sleep apnea     Past Surgical History  Procedure Date  . Cholecystectomy   . Appendectomy   . Tonsillectomy   . Abdominal hysterectomy     No family history on file.  History  Substance Use Topics  . Smoking status: Former Games developer  . Smokeless tobacco: Never Used  . Alcohol Use: No    OB History    Grav Para Term  Preterm Abortions TAB SAB Ect Mult Living                  Review of Systems Ten systems reviewed and are negative for acute change, except as noted in the HPI.   Allergies  Ace inhibitors; Penicillins; and Adhesive  Home Medications   Current Outpatient Rx  Name  Route  Sig  Dispense  Refill  . ALBUTEROL SULFATE (2.5 MG/3ML) 0.083% IN NEBU   Nebulization   Take 2.5 mg by nebulization every 6 (six) hours as needed. Shortness of breath         . ASPIRIN EC 81 MG PO TBEC   Oral   Take 81 mg by mouth daily.         Marland Kitchen VITAMIN D 1000 UNITS PO TABS   Oral   Take 1,000 Units by mouth daily.         Marland Kitchen FLUTICASONE-SALMETEROL 100-50 MCG/DOSE IN AEPB   Inhalation   Inhale 1 puff into the lungs every 12 (twelve) hours.         Marland Kitchen GABAPENTIN 600 MG PO TABS   Oral   Take 600 mg by mouth 3 (three) times daily.         Marland Kitchen HYDROCODONE-ACETAMINOPHEN 5-325 MG PO TABS   Oral   Take 1 tablet by mouth every 6 (six) hours as needed. pain         . INSULIN GLARGINE 100 UNIT/ML [place] SOLN  Subcutaneous   Inject 250 Units into the skin every morning.         . IRBESARTAN 300 MG PO TABS   Oral   Take 300 mg by mouth every morning.         Marland Kitchen METOPROLOL TARTRATE 50 MG PO TABS   Oral   Take 75 mg by mouth 2 (two) times daily.         Marland Kitchen OMEPRAZOLE 20 MG PO CPDR   Oral   Take 20 mg by mouth 2 (two) times daily.         Marland Kitchen POLYETHYLENE GLYCOL 3350 PO PACK   Oral   Take 17 g by mouth daily as needed. constipation         . POTASSIUM CHLORIDE CRYS ER 10 MEQ PO TBCR   Oral   Take 10 mEq by mouth 2 (two) times daily.         Marland Kitchen PROMETHAZINE HCL 25 MG PO TABS   Oral   Take 25 mg by mouth every 6 (six) hours as needed. nausea         . SOLIFENACIN SUCCINATE 10 MG PO TABS   Oral   Take 10 mg by mouth every morning.         Marland Kitchen TRAMADOL HCL ER 300 MG PO TB24   Oral   Take 300 mg by mouth at bedtime.           BP 113/55  Pulse 117  Temp 98.3 F (36.8 C)  (Oral)  Resp 20  SpO2 100%  Physical Exam  Nursing note and vitals reviewed. Constitutional: She is oriented to person, place, and time. She appears well-developed and well-nourished.       Morbidly obese female who appears very anxious  HENT:  Head: Normocephalic and atraumatic.  Eyes: Conjunctivae normal and EOM are normal. Pupils are equal, round, and reactive to light. No scleral icterus.  Neck: Normal range of motion.  Cardiovascular: Normal rate, regular rhythm and normal heart sounds.  Exam reveals no gallop and no friction rub.   No murmur heard. Pulmonary/Chest: Effort normal and breath sounds normal. No respiratory distress.  Abdominal: Soft. Bowel sounds are normal. She exhibits no distension and no mass. There is tenderness (lower quadrants BL). There is no guarding.  Neurological: She is alert and oriented to person, place, and time.       Speech is clear and goal oriented, follows commands Major Cranial nerves without deficit, no facial droop Normal strength in upper and lower extremities bilaterally including dorsiflexion and plantar flexion, strong and equal grip strength Sensation normal to light and sharp touch Moves extremities without ataxia, coordination intact Normal finger to nose and rapid alternating movements   Skin: Skin is warm and dry. She is not diaphoretic.    ED Course  Procedures (including critical care time)  Labs Reviewed  GLUCOSE, CAPILLARY - Abnormal; Notable for the following:    Glucose-Capillary 213 (*)     All other components within normal limits  COMPREHENSIVE METABOLIC PANEL  CBC  URINALYSIS, ROUTINE W REFLEX MICROSCOPIC   Dg Chest 2 View  06/14/2012  *RADIOLOGY REPORT*  Clinical Data:  Cough, fever, shortness of breath  CHEST - 2 VIEW  Comparison: None.  Findings:  Low volume exam accentuating the vascular and basilar interstitial markings.  Normal heart size.  No definite focal pneumonia, collapse, consolidation, edema, effusion  or pneumothorax.  Cervical fusion hardware noted.  Atherosclerosis of the aorta.  Mild  degenerative changes of the spine.  IMPRESSION:  Low volume exam without CHF or focal pneumonia   Original Report Authenticated By: Judie Petit. Miles Costain, M.D.      No diagnosis found.    MDM  3:53 PM BP 113/55  Pulse 117  Temp 98.3 F (36.8 C) (Oral)  Resp 20  SpO2 100% Patient is tachycardic. She is mildly hypotensive.  She may have some underlying infection. I am ordering labs. CXR is clear.   6:39 PM Patient with yeast on her urine. She is frustrated and wants admission because she states that she cannot give herself her insulin or make her own meals.  The patient does have hyponatremia and may need aworkup of renal insufficiency. Patient has been given diflucan. I will d/c with 7 day course.  She is otherwise stable and has home health care.   She does have some tachycardia I believe related to dehydration. I have given fluids. The diastolic pressure has come up and Pulse is trending down.  Patient has PCP follow up.   Filed Vitals:   06/14/12 1342 06/14/12 1844 06/14/12 1846 06/14/12 1919  BP: 113/55 115/46 106/87 112/76  Pulse: 117 102 125 109  Temp:      TempSrc:      Resp: 20     SpO2: 100%   94%      Arthor Captain, PA-C 06/14/12 1928

## 2012-06-16 ENCOUNTER — Encounter: Payer: Self-pay | Admitting: Internal Medicine

## 2012-06-16 ENCOUNTER — Telehealth: Payer: Self-pay | Admitting: *Deleted

## 2012-06-16 DIAGNOSIS — J449 Chronic obstructive pulmonary disease, unspecified: Secondary | ICD-10-CM | POA: Diagnosis not present

## 2012-06-16 DIAGNOSIS — E119 Type 2 diabetes mellitus without complications: Secondary | ICD-10-CM | POA: Diagnosis not present

## 2012-06-16 DIAGNOSIS — I10 Essential (primary) hypertension: Secondary | ICD-10-CM | POA: Diagnosis not present

## 2012-06-16 DIAGNOSIS — G4733 Obstructive sleep apnea (adult) (pediatric): Secondary | ICD-10-CM | POA: Diagnosis not present

## 2012-06-16 DIAGNOSIS — IMO0002 Reserved for concepts with insufficient information to code with codable children: Secondary | ICD-10-CM | POA: Diagnosis not present

## 2012-06-16 DIAGNOSIS — K219 Gastro-esophageal reflux disease without esophagitis: Secondary | ICD-10-CM | POA: Diagnosis not present

## 2012-06-16 NOTE — Telephone Encounter (Signed)
Caller states while she was in pt's home today her O2 sat dropped to 69% when pt was up using the restroom. She requests a order to use O2 during day prn since she is currently only using O2 qhs. Ok per Dr. [name] Coyer ( in Dr. Raphael Gibney absence) to order O2 daily prn. I informed Diane of this and faxed order to [phone].

## 2012-06-20 ENCOUNTER — Other Ambulatory Visit: Payer: Self-pay | Admitting: Internal Medicine

## 2012-06-28 ENCOUNTER — Other Ambulatory Visit: Payer: Medicare Other

## 2012-06-28 ENCOUNTER — Ambulatory Visit (INDEPENDENT_AMBULATORY_CARE_PROVIDER_SITE_OTHER): Payer: Medicare Other | Admitting: Internal Medicine

## 2012-06-28 ENCOUNTER — Encounter: Payer: Self-pay | Admitting: Internal Medicine

## 2012-06-28 VITALS — BP 110/68 | HR 86 | Temp 97.8°F | Wt 302.2 lb

## 2012-06-28 DIAGNOSIS — E1049 Type 1 diabetes mellitus with other diabetic neurological complication: Secondary | ICD-10-CM

## 2012-06-28 DIAGNOSIS — R627 Adult failure to thrive: Secondary | ICD-10-CM

## 2012-06-28 DIAGNOSIS — N390 Urinary tract infection, site not specified: Secondary | ICD-10-CM | POA: Diagnosis not present

## 2012-06-28 DIAGNOSIS — R269 Unspecified abnormalities of gait and mobility: Secondary | ICD-10-CM | POA: Insufficient documentation

## 2012-06-28 DIAGNOSIS — M545 Low back pain: Secondary | ICD-10-CM

## 2012-06-28 DIAGNOSIS — IMO0002 Reserved for concepts with insufficient information to code with codable children: Secondary | ICD-10-CM

## 2012-06-28 DIAGNOSIS — R109 Unspecified abdominal pain: Secondary | ICD-10-CM

## 2012-06-28 NOTE — Assessment & Plan Note (Signed)
Unclear etiology - for ortho referral, cont pain control,  to f/u any worsening symptoms or concerns

## 2012-06-28 NOTE — Assessment & Plan Note (Signed)
I think most likely represents referred pain from the back;  For UA given recent UTI,  to f/u any worsening symptoms or concerns

## 2012-06-28 NOTE — Patient Instructions (Addendum)
Please go to LAB in the Basement for the urine test to be done today Continue all other medications as before Please have the pharmacy call with any other refills you may need. The office will call to help you with your follow up appointment with Dr Everardo All for late Jan 2014 You will be contacted regarding the referral for: orthopedic for the back You will be contacted regarding the referral for: OT and the Aide for home care You are given the note for the Walker with Seat Please sent the SCAT information if you need this filled out Please return in 6 months, or sooner if needed

## 2012-06-28 NOTE — Assessment & Plan Note (Signed)
Ok for walker with seat

## 2012-06-28 NOTE — Progress Notes (Signed)
Subjective:    Patient ID: Christy May, female    DOB: 1951-05-24, 61 y.o.   MRN: [id number]  HPI Here to f/u, was seen in ER after last visit dec 24, dx and tx with UTI, now much improved stamina and  Denies urinary symptoms such as dysuria, frequency, urgency,or hematuria.  Pt continues to have recurring right LBP without change in severity, bowel or bladder change, fever, wt loss,  worsening LE pain/numbness/weakness, gait change or falls but does have ongoing pain to the right lateral side to the groin area.  S/p appy, and ccx.  Does have sense of ongoing fatigue, but denies signficant hypersomnolence, and gait improved, but asks for walker with seat, and cont'd OT and aide she has been having at home.  Refuses to consider placement or assisted living.  Due for DM f/u later jan with Dr Everardo All.  Pt denies chest pain, increased sob or doe, wheezing, orthopnea, PND, increased LE swelling, palpitations, dizziness or syncope.   Pt denies polydipsia, polyuria, .  Pt states overall good compliance with meds, trying to follow lower cholesterol, diabetic diet, wt overall stable. Past Medical History  Diagnosis Date  . Anemia, unspecified   . Extrinsic asthma, unspecified   . Chronic airway obstruction, not elsewhere classified   . Spondylosis of unspecified site without mention of myelopathy   . Dehydration   . Depressive disorder, not elsewhere classified   . Type II or unspecified type diabetes mellitus without mention of complication, not stated as uncontrolled   . Esophageal reflux   . Unspecified essential hypertension   . Unspecified menopausal and postmenopausal disorder   . Obesity, unspecified   . Inflammatory and toxic neuropathy, unspecified   . Unspecified sleep apnea   . Unspecified vitamin D deficiency   . Dysphagia 2007    historyof dysphagia with severe dysmotility by barium swallow-[name]  . Obesity   . Hypertension   . Diabetes mellitus without complication   .  Allergic bronchitis   . Sleep apnea    Past Surgical History  Procedure Date  . Laser sugery     bilateral eyes  . Back surgery     fusion-multiple cervical spine levels-Dr. Lovell Sheehan  . Excision of abcess     ?? Chest  . Dilation and curettage of uterus   . Exploratory laparotomy   . Partial hysterectomy   . Total knee arthroplasty 2005    Left  . Total knee arthroplasty 2005    Right- Dr. Turner Daniels  . Foot surgery     Right X 2  . Cholecystectomy   . Appendectomy   . Tonsillectomy   . Abdominal hysterectomy     reports that she quit smoking about 26 years ago. She has never used smokeless tobacco. She reports that she does not drink alcohol or use illicit drugs. family history includes Colon polyps in her brother; Diabetes in her sister and unspecified family member; Esophageal cancer in her brother and sister; Heart disease in her maternal grandfather; and Pancreatic cancer in her sister. Allergies  Allergen Reactions  . Ace Inhibitors Anaphylaxis  . Ace Inhibitors Anaphylaxis  . Penicillins Hives and Rash  . Adhesive (Tape)   . Penicillins     Unknown   . Tape Hives   Current Outpatient Prescriptions on File Prior to Visit  Medication Sig Dispense Refill  . albuterol (PROVENTIL) (2.5 MG/3ML) 0.083% nebulizer solution Take 2.5 mg by nebulization every 6 (six) hours as needed. Respiratory distress      .  aspirin 81 MG tablet Take 81 mg by mouth daily.        . Blood Glucose Monitoring Suppl (ONE TOUCH ULTRA MINI) W/DEVICE KIT 1 Device by Does not apply route once.      . cholecalciferol (VITAMIN D) 1000 UNITS tablet Take 2,000 Units by mouth daily.        . Fluticasone-Salmeterol (ADVAIR DISKUS) 100-50 MCG/DOSE AEPB Inhale 1 puff into the lungs every 12 (twelve) hours.        . gabapentin (NEURONTIN) 600 MG tablet TAKE ONE TABLET BY MOUTH THREE TIMES DAILY  270 tablet  2  . glucose blood test strip 1 each by Other route as needed. Use as instructed to check blood sugar twice  daily dx 250.63      . HYDROcodone-acetaminophen (NORCO/VICODIN) 5-325 MG per tablet Take 1 tablet by mouth every 6 (six) hours as needed. pain      . insulin glargine (LANTUS) 100 UNIT/ML injection Inject 250 Units into the skin daily. and syringes, 1 daily.      . irbesartan (AVAPRO) 300 MG tablet Take 1 tablet (300 mg total) by mouth daily.  90 tablet  3  . Lancets (ONETOUCH ULTRASOFT) lancets 1 each by Other route as needed. Use as instructed to check blood sugar twice daily dx 250.63      . metoprolol (LOPRESSOR) 50 MG tablet Take 75 mg by mouth 2 (two) times daily.      Marland Kitchen omeprazole (PRILOSEC) 20 MG capsule Take 20 mg by mouth 2 (two) times daily.      . polyethylene glycol (MIRALAX / GLYCOLAX) packet Take 17 g by mouth daily as needed. constipation      . potassium chloride (K-DUR,KLOR-CON) 10 MEQ tablet Take 10 mEq by mouth 2 (two) times daily.      . promethazine (PHENERGAN) 25 MG tablet Take 25 mg by mouth every 6 (six) hours as needed. nausea      . solifenacin (VESICARE) 10 MG tablet Take 10 mg by mouth every morning.      . traMADol (ULTRAM-ER) 300 MG 24 hr tablet Take 300 mg by mouth daily.       Review of Systems  Constitutional: Negative for diaphoresis and unexpected weight change.  HENT: Negative for tinnitus.   Eyes: Negative for photophobia and visual disturbance.  Respiratory: Negative for choking and stridor.   Gastrointestinal: Negative for vomiting and blood in stool.  Genitourinary: Negative for hematuria and decreased urine volume.  Musculoskeletal: Negative for joint swelling Skin: Negative for color change and wound.  Neurological: Negative for tremors and numbness.  Psychiatric/Behavioral: Negative for decreased concentration. The patient is not hyperactive.       Objective:   Physical Exam BP 110/68  Pulse 86  Temp 97.8 F (36.6 C) (Oral)  Wt 302 lb 4 oz (137.1 kg)  SpO2 98% Physical Exam  VS noted, obese, fatigued Constitutional: Pt appears  well-developed and well-nourished.  HENT: Head: Normocephalic.  Right Ear: External ear normal.  Left Ear: External ear normal.  Eyes: Conjunctivae and EOM are normal. Pupils are equal, round, and reactive to light.  Neck: Normal range of motion. Neck supple.  Cardiovascular: Normal rate and regular rhythm.   Pulmonary/Chest: Effort normal and breath sounds normal.  Abd:  Soft, NT, non-distended, + BS Neurological: Pt is alert. Not confused , gait slow, motor intact Spine tender low mid lumbar paravertebral area Skin: Skin is warm. No erythema.  Psychiatric: Pt behavior is normal. Thought content normal.  Assessment & Plan:

## 2012-06-28 NOTE — Assessment & Plan Note (Signed)
Ok to resume home health if approp with OT and aide for help

## 2012-06-28 NOTE — Assessment & Plan Note (Signed)
?   Improved, unclear control, cont meds as is, f/u Dr Everardo All as planned - will help arrange f/u

## 2012-07-01 ENCOUNTER — Other Ambulatory Visit: Payer: Self-pay

## 2012-07-01 DIAGNOSIS — E1065 Type 1 diabetes mellitus with hyperglycemia: Secondary | ICD-10-CM | POA: Diagnosis not present

## 2012-07-01 DIAGNOSIS — R627 Adult failure to thrive: Secondary | ICD-10-CM | POA: Diagnosis not present

## 2012-07-01 DIAGNOSIS — I10 Essential (primary) hypertension: Secondary | ICD-10-CM | POA: Diagnosis not present

## 2012-07-01 DIAGNOSIS — E1049 Type 1 diabetes mellitus with other diabetic neurological complication: Secondary | ICD-10-CM | POA: Diagnosis not present

## 2012-07-01 DIAGNOSIS — D649 Anemia, unspecified: Secondary | ICD-10-CM | POA: Diagnosis not present

## 2012-07-01 DIAGNOSIS — J45901 Unspecified asthma with (acute) exacerbation: Secondary | ICD-10-CM | POA: Diagnosis not present

## 2012-07-01 DIAGNOSIS — J441 Chronic obstructive pulmonary disease with (acute) exacerbation: Secondary | ICD-10-CM | POA: Diagnosis not present

## 2012-07-01 MED ORDER — OMEPRAZOLE 20 MG PO CPDR: 20.0000 mg | DELAYED_RELEASE_CAPSULE | Freq: Two times a day (BID) | ORAL | Status: DC

## 2012-07-05 DIAGNOSIS — E1065 Type 1 diabetes mellitus with hyperglycemia: Secondary | ICD-10-CM | POA: Diagnosis not present

## 2012-07-05 DIAGNOSIS — J441 Chronic obstructive pulmonary disease with (acute) exacerbation: Secondary | ICD-10-CM | POA: Diagnosis not present

## 2012-07-05 DIAGNOSIS — R627 Adult failure to thrive: Secondary | ICD-10-CM | POA: Diagnosis not present

## 2012-07-05 DIAGNOSIS — D649 Anemia, unspecified: Secondary | ICD-10-CM | POA: Diagnosis not present

## 2012-07-05 DIAGNOSIS — I10 Essential (primary) hypertension: Secondary | ICD-10-CM | POA: Diagnosis not present

## 2012-07-06 DIAGNOSIS — J441 Chronic obstructive pulmonary disease with (acute) exacerbation: Secondary | ICD-10-CM | POA: Diagnosis not present

## 2012-07-06 DIAGNOSIS — D649 Anemia, unspecified: Secondary | ICD-10-CM | POA: Diagnosis not present

## 2012-07-06 DIAGNOSIS — R627 Adult failure to thrive: Secondary | ICD-10-CM | POA: Diagnosis not present

## 2012-07-06 DIAGNOSIS — E1049 Type 1 diabetes mellitus with other diabetic neurological complication: Secondary | ICD-10-CM | POA: Diagnosis not present

## 2012-07-06 DIAGNOSIS — I10 Essential (primary) hypertension: Secondary | ICD-10-CM | POA: Diagnosis not present

## 2012-07-08 ENCOUNTER — Other Ambulatory Visit: Payer: Self-pay | Admitting: *Deleted

## 2012-07-08 DIAGNOSIS — D649 Anemia, unspecified: Secondary | ICD-10-CM | POA: Diagnosis not present

## 2012-07-08 DIAGNOSIS — J441 Chronic obstructive pulmonary disease with (acute) exacerbation: Secondary | ICD-10-CM | POA: Diagnosis not present

## 2012-07-08 DIAGNOSIS — R627 Adult failure to thrive: Secondary | ICD-10-CM | POA: Diagnosis not present

## 2012-07-08 DIAGNOSIS — E1049 Type 1 diabetes mellitus with other diabetic neurological complication: Secondary | ICD-10-CM | POA: Diagnosis not present

## 2012-07-08 DIAGNOSIS — I10 Essential (primary) hypertension: Secondary | ICD-10-CM | POA: Diagnosis not present

## 2012-07-08 MED ORDER — HYDROCODONE-ACETAMINOPHEN 5-325 MG PO TABS: 1.0000 | ORAL_TABLET | Freq: Four times a day (QID) | ORAL | Status: DC | PRN

## 2012-07-08 NOTE — Telephone Encounter (Signed)
Last OV 06-28-12,last filled 08-18-11 #120 1

## 2012-07-08 NOTE — Telephone Encounter (Signed)
Done hardcopy to felicia 

## 2012-07-08 NOTE — Telephone Encounter (Signed)
Rx faxed to [facility].

## 2012-07-11 ENCOUNTER — Other Ambulatory Visit: Payer: Self-pay

## 2012-07-11 DIAGNOSIS — I10 Essential (primary) hypertension: Secondary | ICD-10-CM

## 2012-07-11 DIAGNOSIS — E1065 Type 1 diabetes mellitus with hyperglycemia: Secondary | ICD-10-CM | POA: Diagnosis not present

## 2012-07-11 DIAGNOSIS — E1049 Type 1 diabetes mellitus with other diabetic neurological complication: Secondary | ICD-10-CM

## 2012-07-11 DIAGNOSIS — J441 Chronic obstructive pulmonary disease with (acute) exacerbation: Secondary | ICD-10-CM

## 2012-07-11 DIAGNOSIS — J45901 Unspecified asthma with (acute) exacerbation: Secondary | ICD-10-CM

## 2012-07-11 MED ORDER — SOLIFENACIN SUCCINATE 10 MG PO TABS: 10.0000 mg | ORAL_TABLET | Freq: Every morning | ORAL | Status: DC

## 2012-07-13 ENCOUNTER — Encounter: Payer: Self-pay | Admitting: Endocrinology

## 2012-07-13 ENCOUNTER — Ambulatory Visit (INDEPENDENT_AMBULATORY_CARE_PROVIDER_SITE_OTHER): Payer: Medicare Other | Admitting: Endocrinology

## 2012-07-13 VITALS — BP 128/80 | HR 73 | Wt 304.0 lb

## 2012-07-13 DIAGNOSIS — J45901 Unspecified asthma with (acute) exacerbation: Secondary | ICD-10-CM | POA: Diagnosis not present

## 2012-07-13 DIAGNOSIS — E119 Type 2 diabetes mellitus without complications: Secondary | ICD-10-CM | POA: Diagnosis not present

## 2012-07-13 DIAGNOSIS — R627 Adult failure to thrive: Secondary | ICD-10-CM | POA: Diagnosis not present

## 2012-07-13 DIAGNOSIS — E1065 Type 1 diabetes mellitus with hyperglycemia: Secondary | ICD-10-CM

## 2012-07-13 DIAGNOSIS — I10 Essential (primary) hypertension: Secondary | ICD-10-CM | POA: Diagnosis not present

## 2012-07-13 DIAGNOSIS — N390 Urinary tract infection, site not specified: Secondary | ICD-10-CM

## 2012-07-13 DIAGNOSIS — E1049 Type 1 diabetes mellitus with other diabetic neurological complication: Secondary | ICD-10-CM | POA: Diagnosis not present

## 2012-07-13 DIAGNOSIS — D649 Anemia, unspecified: Secondary | ICD-10-CM | POA: Diagnosis not present

## 2012-07-13 NOTE — Progress Notes (Signed)
Subjective:    Patient ID: Christy May, female    DOB: [birth year], 62 y.o.   MRN: [id number]  HPI Pt returns for f/u of DM (dx'ed approx 1985--complicated by peripheral sensory neuropathy; characterized by insulin resistance; she has done better with a simpler qd insulin regimen).  Pt says she has missed her insulin only once in the past month.  she brings a record of her cbg's which i have reviewed today.  It varies from 68-270, but most are in the 100's.   UTI sxs have resolved with abx. Past Medical History  Diagnosis Date  . Anemia, unspecified   . Extrinsic asthma, unspecified   . Chronic airway obstruction, not elsewhere classified   . Spondylosis of unspecified site without mention of myelopathy   . Dehydration   . Depressive disorder, not elsewhere classified   . Type II or unspecified type diabetes mellitus without mention of complication, not stated as uncontrolled   . Esophageal reflux   . Unspecified essential hypertension   . Unspecified menopausal and postmenopausal disorder   . Obesity, unspecified   . Inflammatory and toxic neuropathy, unspecified   . Unspecified sleep apnea   . Unspecified vitamin D deficiency   . Dysphagia 2007    historyof dysphagia with severe dysmotility by barium swallow-[name]  . Obesity   . Hypertension   . Diabetes mellitus without complication   . Allergic bronchitis   . Sleep apnea     Past Surgical History  Procedure Date  . Laser sugery     bilateral eyes  . Back surgery     fusion-multiple cervical spine levels-Dr. Lovell Sheehan  . Excision of abcess     ?? Chest  . Dilation and curettage of uterus   . Exploratory laparotomy   . Partial hysterectomy   . Total knee arthroplasty 2005    Left  . Total knee arthroplasty 2005    Right- Dr. Turner Daniels  . Foot surgery     Right X 2  . Cholecystectomy   . Appendectomy   . Tonsillectomy   . Abdominal hysterectomy     History   Social History  . Marital Status: Widowed   Spouse Name: N/A    Number of Children: N/A  . Years of Education: N/A   Occupational History  . [employer] (former)    Social History Main Topics  . Smoking status: Former Smoker    Quit date: 06/22/1986  . Smokeless tobacco: Never Used  . Alcohol Use: No  . Drug Use: No  . Sexually Active:    Other Topics Concern  . Not on file   Social History Narrative   ** Merged History Encounter ** Patient does not get regular exercise.Widowed 2004Disabled    Current Outpatient Prescriptions on File Prior to Visit  Medication Sig Dispense Refill  . albuterol (PROVENTIL) (2.5 MG/3ML) 0.083% nebulizer solution Take 2.5 mg by nebulization every 6 (six) hours as needed. Respiratory distress      . aspirin 81 MG tablet Take 81 mg by mouth daily.        . Blood Glucose Monitoring Suppl (ONE TOUCH ULTRA MINI) W/DEVICE KIT 1 Device by Does not apply route once.      . cholecalciferol (VITAMIN D) 1000 UNITS tablet Take 2,000 Units by mouth daily.        . Fluticasone-Salmeterol (ADVAIR DISKUS) 100-50 MCG/DOSE AEPB Inhale 1 puff into the lungs every 12 (twelve) hours.        . gabapentin (  NEURONTIN) 600 MG tablet TAKE ONE TABLET BY MOUTH THREE TIMES DAILY  270 tablet  2  . glucose blood test strip 1 each by Other route as needed. Use as instructed to check blood sugar twice daily dx 250.63      . HYDROcodone-acetaminophen (NORCO/VICODIN) 5-325 MG per tablet Take 1 tablet by mouth every 6 (six) hours as needed. pain  60 tablet  1  . insulin glargine (LANTUS) 100 UNIT/ML injection Inject 250 Units into the skin daily. and syringes, 1 daily.      . irbesartan (AVAPRO) 300 MG tablet Take 1 tablet (300 mg total) by mouth daily.  90 tablet  3  . Lancets (ONETOUCH ULTRASOFT) lancets 1 each by Other route as needed. Use as instructed to check blood sugar twice daily dx 250.63      . metoprolol (LOPRESSOR) 50 MG tablet Take 75 mg by mouth 2 (two) times daily.      Marland Kitchen omeprazole (PRILOSEC) 20 MG capsule  Take 1 capsule (20 mg total) by mouth 2 (two) times daily.  180 capsule  3  . polyethylene glycol (MIRALAX / GLYCOLAX) packet Take 17 g by mouth daily as needed. constipation      . potassium chloride (K-DUR,KLOR-CON) 10 MEQ tablet Take 10 mEq by mouth 2 (two) times daily.      . promethazine (PHENERGAN) 25 MG tablet Take 25 mg by mouth every 6 (six) hours as needed. nausea      . solifenacin (VESICARE) 10 MG tablet Take 1 tablet (10 mg total) by mouth every morning.  30 tablet  11  . traMADol (ULTRAM-ER) 300 MG 24 hr tablet Take 300 mg by mouth daily.        Allergies  Allergen Reactions  . Ace Inhibitors Anaphylaxis  . Ace Inhibitors Anaphylaxis  . Penicillins Hives and Rash  . Adhesive (Tape)   . Penicillins     Unknown   . Tape Hives    Family History  Problem Relation Age of Onset  . Esophageal cancer Brother   . Esophageal cancer Sister     ?  . Colon polyps Brother   . Pancreatic cancer Sister     ?  . Diabetes Sister   . Diabetes      Aunt and Uncle  . Heart disease Maternal Grandfather     BP 128/80  Pulse 73  Wt 304 lb (137.893 kg)  SpO2 96%   Review of Systems Denies LOC.    Objective:   Physical Exam Pulses: dorsalis pedis intact bilat.   Feet: no deformity.  no ulcer on the feet.  feet are of normal color and temp.  Trace bilat leg edema Neuro: sensation is intact to touch on the feet, but decreased from normal.        Assessment & Plan:  DM: uncertain control UTI, clinically better

## 2012-07-13 NOTE — Patient Instructions (Addendum)
blood tests are being requested for you today.  You will be contacted with results. Please come back for a follow-up appointment in 3 months. check your blood sugar twice a day.  vary the time of day when you check, between before the 3 meals, and at bedtime.  also check if you have symptoms of your blood sugar being too high or too low.  please keep a record of the readings and bring it to your next appointment here.  please call us sooner if your blood sugar goes below 70, or if you have a lot of readings over 200. 

## 2012-07-14 ENCOUNTER — Other Ambulatory Visit: Payer: Self-pay | Admitting: Endocrinology

## 2012-07-14 ENCOUNTER — Telehealth: Payer: Self-pay | Admitting: *Deleted

## 2012-07-14 ENCOUNTER — Telehealth: Payer: Self-pay | Admitting: Endocrinology

## 2012-07-14 DIAGNOSIS — E1049 Type 1 diabetes mellitus with other diabetic neurological complication: Secondary | ICD-10-CM | POA: Diagnosis not present

## 2012-07-14 DIAGNOSIS — D649 Anemia, unspecified: Secondary | ICD-10-CM | POA: Diagnosis not present

## 2012-07-14 DIAGNOSIS — R627 Adult failure to thrive: Secondary | ICD-10-CM | POA: Diagnosis not present

## 2012-07-14 DIAGNOSIS — J441 Chronic obstructive pulmonary disease with (acute) exacerbation: Secondary | ICD-10-CM | POA: Diagnosis not present

## 2012-07-14 DIAGNOSIS — I10 Essential (primary) hypertension: Secondary | ICD-10-CM | POA: Diagnosis not present

## 2012-07-14 LAB — HEMOGLOBIN A1C: Hgb A1c MFr Bld: 8.1 % — ABNORMAL HIGH (ref 4.6–6.5)

## 2012-07-14 MED ORDER — INSULIN NPH (HUMAN) (ISOPHANE) 100 UNIT/ML ~~LOC~~ SUSP: 250.0000 [IU] | SUBCUTANEOUS | Status: DC

## 2012-07-14 NOTE — Telephone Encounter (Signed)
i called pt i have sent a prescription to your pharmacy, for a much cheaper insulin.  Please try this. If you have prescription drug coverage, pt assistance is very unlikely to be approved, but you are welcome to try

## 2012-07-14 NOTE — Telephone Encounter (Signed)
As dr Jonny Ruiz is your pcp, i prescribe only the insulin. Upon knowing the result of yesterdays blood test result, i would be happy to change the lantus to a cheaper insulin.

## 2012-07-14 NOTE — Telephone Encounter (Signed)
Left msg on triage wanting to ger patient assistant on her meds. Called pt back inform her that she will have to go to which ever med website and down load patient assistant forms for md to fill-out...lmb

## 2012-07-14 NOTE — Telephone Encounter (Signed)
Pt states she would like help to receive patient assistance with all her meds, she states they are too exspensive

## 2012-07-14 NOTE — Telephone Encounter (Signed)
Pt states she would like to speak with you before you change her insulin, once results are back pt stated that you have have requested patient asst. In the past?

## 2012-07-15 DIAGNOSIS — E1065 Type 1 diabetes mellitus with hyperglycemia: Secondary | ICD-10-CM | POA: Diagnosis not present

## 2012-07-15 DIAGNOSIS — R627 Adult failure to thrive: Secondary | ICD-10-CM | POA: Diagnosis not present

## 2012-07-15 DIAGNOSIS — I10 Essential (primary) hypertension: Secondary | ICD-10-CM | POA: Diagnosis not present

## 2012-07-15 DIAGNOSIS — J441 Chronic obstructive pulmonary disease with (acute) exacerbation: Secondary | ICD-10-CM | POA: Diagnosis not present

## 2012-07-15 DIAGNOSIS — D649 Anemia, unspecified: Secondary | ICD-10-CM | POA: Diagnosis not present

## 2012-07-18 ENCOUNTER — Telehealth: Payer: Self-pay | Admitting: Endocrinology

## 2012-07-18 DIAGNOSIS — D649 Anemia, unspecified: Secondary | ICD-10-CM | POA: Diagnosis not present

## 2012-07-18 DIAGNOSIS — R627 Adult failure to thrive: Secondary | ICD-10-CM | POA: Diagnosis not present

## 2012-07-18 DIAGNOSIS — E1065 Type 1 diabetes mellitus with hyperglycemia: Secondary | ICD-10-CM | POA: Diagnosis not present

## 2012-07-18 DIAGNOSIS — J45901 Unspecified asthma with (acute) exacerbation: Secondary | ICD-10-CM | POA: Diagnosis not present

## 2012-07-18 DIAGNOSIS — I10 Essential (primary) hypertension: Secondary | ICD-10-CM | POA: Diagnosis not present

## 2012-07-18 MED ORDER — INSULIN GLARGINE 100 UNIT/ML ~~LOC~~ SOLN: 250.0000 [IU] | SUBCUTANEOUS | Status: DC

## 2012-07-18 NOTE — Telephone Encounter (Signed)
Pt advised rx changed to lantus vial.

## 2012-07-18 NOTE — Telephone Encounter (Signed)
i changed, and i sent rx 

## 2012-07-18 NOTE — Telephone Encounter (Signed)
i changed rx and resent

## 2012-07-18 NOTE — Telephone Encounter (Signed)
The patient called to report that the Humulin insulin is $30 more expensive and she would like to be changed back to her original insulin (Lantus).  Please call the patient to advise.  She may be reached at [phone].

## 2012-07-18 NOTE — Telephone Encounter (Signed)
Pt states she does not want Lantus Solostar pen she wants the vial and ususally gets 8 or 9 vials at a time, please resend

## 2012-07-20 ENCOUNTER — Other Ambulatory Visit: Payer: Self-pay | Admitting: Sports Medicine

## 2012-07-20 DIAGNOSIS — M545 Low back pain: Secondary | ICD-10-CM

## 2012-07-20 DIAGNOSIS — M542 Cervicalgia: Secondary | ICD-10-CM | POA: Diagnosis not present

## 2012-07-20 DIAGNOSIS — M67919 Unspecified disorder of synovium and tendon, unspecified shoulder: Secondary | ICD-10-CM | POA: Diagnosis not present

## 2012-07-20 DIAGNOSIS — I10 Essential (primary) hypertension: Secondary | ICD-10-CM | POA: Diagnosis not present

## 2012-07-20 DIAGNOSIS — M5430 Sciatica, unspecified side: Secondary | ICD-10-CM | POA: Diagnosis not present

## 2012-07-20 DIAGNOSIS — R627 Adult failure to thrive: Secondary | ICD-10-CM | POA: Diagnosis not present

## 2012-07-20 DIAGNOSIS — J441 Chronic obstructive pulmonary disease with (acute) exacerbation: Secondary | ICD-10-CM | POA: Diagnosis not present

## 2012-07-20 DIAGNOSIS — E1049 Type 1 diabetes mellitus with other diabetic neurological complication: Secondary | ICD-10-CM | POA: Diagnosis not present

## 2012-07-20 DIAGNOSIS — D649 Anemia, unspecified: Secondary | ICD-10-CM | POA: Diagnosis not present

## 2012-07-21 ENCOUNTER — Other Ambulatory Visit: Payer: Medicare Other

## 2012-07-21 DIAGNOSIS — R627 Adult failure to thrive: Secondary | ICD-10-CM | POA: Diagnosis not present

## 2012-07-21 DIAGNOSIS — J45901 Unspecified asthma with (acute) exacerbation: Secondary | ICD-10-CM | POA: Diagnosis not present

## 2012-07-21 DIAGNOSIS — I10 Essential (primary) hypertension: Secondary | ICD-10-CM | POA: Diagnosis not present

## 2012-07-21 DIAGNOSIS — E1049 Type 1 diabetes mellitus with other diabetic neurological complication: Secondary | ICD-10-CM | POA: Diagnosis not present

## 2012-07-21 DIAGNOSIS — D649 Anemia, unspecified: Secondary | ICD-10-CM | POA: Diagnosis not present

## 2012-07-22 DIAGNOSIS — E1049 Type 1 diabetes mellitus with other diabetic neurological complication: Secondary | ICD-10-CM | POA: Diagnosis not present

## 2012-07-22 DIAGNOSIS — I10 Essential (primary) hypertension: Secondary | ICD-10-CM | POA: Diagnosis not present

## 2012-07-22 DIAGNOSIS — J441 Chronic obstructive pulmonary disease with (acute) exacerbation: Secondary | ICD-10-CM | POA: Diagnosis not present

## 2012-07-22 DIAGNOSIS — D649 Anemia, unspecified: Secondary | ICD-10-CM | POA: Diagnosis not present

## 2012-07-22 DIAGNOSIS — R627 Adult failure to thrive: Secondary | ICD-10-CM | POA: Diagnosis not present

## 2012-07-25 ENCOUNTER — Ambulatory Visit
Admission: RE | Admit: 2012-07-25 | Discharge: 2012-07-25 | Disposition: A | Discharge status: Home / Self Care | Payer: Medicare Other | Source: Ambulatory Visit | Attending: Sports Medicine | Admitting: Sports Medicine

## 2012-07-25 DIAGNOSIS — I10 Essential (primary) hypertension: Secondary | ICD-10-CM | POA: Diagnosis not present

## 2012-07-25 DIAGNOSIS — M545 Low back pain: Secondary | ICD-10-CM

## 2012-07-25 DIAGNOSIS — M47817 Spondylosis without myelopathy or radiculopathy, lumbosacral region: Secondary | ICD-10-CM | POA: Diagnosis not present

## 2012-07-25 DIAGNOSIS — J441 Chronic obstructive pulmonary disease with (acute) exacerbation: Secondary | ICD-10-CM | POA: Diagnosis not present

## 2012-07-25 DIAGNOSIS — D649 Anemia, unspecified: Secondary | ICD-10-CM | POA: Diagnosis not present

## 2012-07-25 DIAGNOSIS — M5126 Other intervertebral disc displacement, lumbar region: Secondary | ICD-10-CM | POA: Diagnosis not present

## 2012-07-25 DIAGNOSIS — R627 Adult failure to thrive: Secondary | ICD-10-CM | POA: Diagnosis not present

## 2012-07-25 DIAGNOSIS — E1065 Type 1 diabetes mellitus with hyperglycemia: Secondary | ICD-10-CM | POA: Diagnosis not present

## 2012-07-26 DIAGNOSIS — I10 Essential (primary) hypertension: Secondary | ICD-10-CM | POA: Diagnosis not present

## 2012-07-26 DIAGNOSIS — E1049 Type 1 diabetes mellitus with other diabetic neurological complication: Secondary | ICD-10-CM | POA: Diagnosis not present

## 2012-07-26 DIAGNOSIS — J441 Chronic obstructive pulmonary disease with (acute) exacerbation: Secondary | ICD-10-CM | POA: Diagnosis not present

## 2012-07-26 DIAGNOSIS — R627 Adult failure to thrive: Secondary | ICD-10-CM | POA: Diagnosis not present

## 2012-07-26 DIAGNOSIS — D649 Anemia, unspecified: Secondary | ICD-10-CM | POA: Diagnosis not present

## 2012-07-26 DIAGNOSIS — J45901 Unspecified asthma with (acute) exacerbation: Secondary | ICD-10-CM | POA: Diagnosis not present

## 2012-07-27 DIAGNOSIS — E1065 Type 1 diabetes mellitus with hyperglycemia: Secondary | ICD-10-CM | POA: Diagnosis not present

## 2012-07-27 DIAGNOSIS — M5106 Intervertebral disc disorders with myelopathy, lumbar region: Secondary | ICD-10-CM | POA: Diagnosis not present

## 2012-07-27 DIAGNOSIS — D649 Anemia, unspecified: Secondary | ICD-10-CM | POA: Diagnosis not present

## 2012-07-27 DIAGNOSIS — R627 Adult failure to thrive: Secondary | ICD-10-CM | POA: Diagnosis not present

## 2012-07-27 DIAGNOSIS — J441 Chronic obstructive pulmonary disease with (acute) exacerbation: Secondary | ICD-10-CM | POA: Diagnosis not present

## 2012-07-27 DIAGNOSIS — I10 Essential (primary) hypertension: Secondary | ICD-10-CM | POA: Diagnosis not present

## 2012-07-29 DIAGNOSIS — D649 Anemia, unspecified: Secondary | ICD-10-CM | POA: Diagnosis not present

## 2012-07-29 DIAGNOSIS — J441 Chronic obstructive pulmonary disease with (acute) exacerbation: Secondary | ICD-10-CM | POA: Diagnosis not present

## 2012-07-29 DIAGNOSIS — I10 Essential (primary) hypertension: Secondary | ICD-10-CM | POA: Diagnosis not present

## 2012-07-29 DIAGNOSIS — E1049 Type 1 diabetes mellitus with other diabetic neurological complication: Secondary | ICD-10-CM | POA: Diagnosis not present

## 2012-07-29 DIAGNOSIS — R627 Adult failure to thrive: Secondary | ICD-10-CM | POA: Diagnosis not present

## 2012-07-29 DIAGNOSIS — E1065 Type 1 diabetes mellitus with hyperglycemia: Secondary | ICD-10-CM | POA: Diagnosis not present

## 2012-08-01 ENCOUNTER — Telehealth: Payer: Self-pay

## 2012-08-01 MED ORDER — PROMETHAZINE-CODEINE 6.25-10 MG/5ML PO SYRP: 5.0000 mL | ORAL_SOLUTION | Freq: Four times a day (QID) | ORAL | Status: DC | PRN

## 2012-08-01 NOTE — Telephone Encounter (Signed)
Done hardcopy to robin  

## 2012-08-01 NOTE — Telephone Encounter (Signed)
Patient called stating she has had a cough for 3 days and concerned she is getting sick again.  She is requesting the Promethazine Codeine 6.25-10 mg syrup refilled.   Please advise.

## 2012-08-01 NOTE — Telephone Encounter (Signed)
Patient informed and faxed hardcopy to pharmacy

## 2012-08-02 DIAGNOSIS — J441 Chronic obstructive pulmonary disease with (acute) exacerbation: Secondary | ICD-10-CM | POA: Diagnosis not present

## 2012-08-02 DIAGNOSIS — E1049 Type 1 diabetes mellitus with other diabetic neurological complication: Secondary | ICD-10-CM | POA: Diagnosis not present

## 2012-08-02 DIAGNOSIS — I10 Essential (primary) hypertension: Secondary | ICD-10-CM | POA: Diagnosis not present

## 2012-08-02 DIAGNOSIS — R627 Adult failure to thrive: Secondary | ICD-10-CM | POA: Diagnosis not present

## 2012-08-02 DIAGNOSIS — D649 Anemia, unspecified: Secondary | ICD-10-CM | POA: Diagnosis not present

## 2012-08-08 DIAGNOSIS — E1149 Type 2 diabetes mellitus with other diabetic neurological complication: Secondary | ICD-10-CM | POA: Diagnosis not present

## 2012-08-08 DIAGNOSIS — J45901 Unspecified asthma with (acute) exacerbation: Secondary | ICD-10-CM | POA: Diagnosis not present

## 2012-08-08 DIAGNOSIS — B351 Tinea unguium: Secondary | ICD-10-CM | POA: Diagnosis not present

## 2012-08-08 DIAGNOSIS — I10 Essential (primary) hypertension: Secondary | ICD-10-CM | POA: Diagnosis not present

## 2012-08-08 DIAGNOSIS — E1065 Type 1 diabetes mellitus with hyperglycemia: Secondary | ICD-10-CM | POA: Diagnosis not present

## 2012-08-08 DIAGNOSIS — R627 Adult failure to thrive: Secondary | ICD-10-CM | POA: Diagnosis not present

## 2012-08-08 DIAGNOSIS — J441 Chronic obstructive pulmonary disease with (acute) exacerbation: Secondary | ICD-10-CM | POA: Diagnosis not present

## 2012-08-08 DIAGNOSIS — L84 Corns and callosities: Secondary | ICD-10-CM | POA: Diagnosis not present

## 2012-08-08 DIAGNOSIS — D649 Anemia, unspecified: Secondary | ICD-10-CM | POA: Diagnosis not present

## 2012-08-08 DIAGNOSIS — M79609 Pain in unspecified limb: Secondary | ICD-10-CM | POA: Diagnosis not present

## 2012-08-09 ENCOUNTER — Other Ambulatory Visit: Payer: Self-pay

## 2012-08-09 MED ORDER — PROMETHAZINE-CODEINE 6.25-10 MG/5ML PO SYRP: 5.0000 mL | ORAL_SOLUTION | Freq: Four times a day (QID) | ORAL | Status: DC | PRN

## 2012-08-09 NOTE — Telephone Encounter (Signed)
Done hardcopy to lucy 

## 2012-08-09 NOTE — Telephone Encounter (Signed)
Rx faxed to [facility].

## 2012-08-11 DIAGNOSIS — D649 Anemia, unspecified: Secondary | ICD-10-CM | POA: Diagnosis not present

## 2012-08-12 ENCOUNTER — Telehealth: Payer: Self-pay | Admitting: Internal Medicine

## 2012-08-12 NOTE — Telephone Encounter (Signed)
HHRN informed 

## 2012-08-12 NOTE — Telephone Encounter (Signed)
Angela, Occupational Therapist with [facility], is requesting a verbal order to continue OT for 3 weeks, she can be reached at [phone] °  °

## 2012-08-12 NOTE — Telephone Encounter (Signed)
Marylene Land, Occupational Therapist with [facility], is requesting a verbal order to continue OT for 3 weeks, she can be reached at [phone]

## 2012-08-12 NOTE — Telephone Encounter (Signed)
Ok for verbal 

## 2012-08-16 ENCOUNTER — Other Ambulatory Visit: Payer: Self-pay | Admitting: Internal Medicine

## 2012-08-16 NOTE — Telephone Encounter (Signed)
Done erx 

## 2012-08-17 ENCOUNTER — Telehealth: Payer: Self-pay

## 2012-08-17 DIAGNOSIS — D649 Anemia, unspecified: Secondary | ICD-10-CM | POA: Diagnosis not present

## 2012-08-17 MED ORDER — SOLIFENACIN SUCCINATE 10 MG PO TABS: 10.0000 mg | ORAL_TABLET | Freq: Every morning | ORAL | Status: DC

## 2012-08-17 NOTE — Telephone Encounter (Signed)
refill 

## 2012-08-23 ENCOUNTER — Telehealth: Payer: Self-pay

## 2012-08-23 NOTE — Telephone Encounter (Signed)
The alternative is to draw-up NPH insulin.  Ok?

## 2012-08-23 NOTE — Telephone Encounter (Signed)
Pt states Lantus is too expsensive $269 last month it only cost $33, what to do?

## 2012-08-24 MED ORDER — INSULIN NPH (HUMAN) (ISOPHANE) 100 UNIT/ML ~~LOC~~ SUSP: 250.0000 [IU] | SUBCUTANEOUS | Status: DC

## 2012-08-24 NOTE — Telephone Encounter (Signed)
i sent rx 

## 2012-08-24 NOTE — Addendum Note (Signed)
Addended by: Romero Belling on: 08/24/2012 05:08 PM   Modules accepted: Orders

## 2012-08-24 NOTE — Telephone Encounter (Signed)
Pt states she is already drawing up the insulin from a vial, she doesn't understand why we can not get this straightened out, pt request phone from Dr. Everardo All

## 2012-08-25 ENCOUNTER — Encounter: Payer: Self-pay | Admitting: Internal Medicine

## 2012-08-25 ENCOUNTER — Ambulatory Visit (INDEPENDENT_AMBULATORY_CARE_PROVIDER_SITE_OTHER): Payer: Medicare Other | Admitting: Internal Medicine

## 2012-08-25 ENCOUNTER — Ambulatory Visit (INDEPENDENT_AMBULATORY_CARE_PROVIDER_SITE_OTHER)
Admission: RE | Admit: 2012-08-25 | Discharge: 2012-08-25 | Disposition: A | Discharge status: Home / Self Care | Payer: Medicare Other | Source: Ambulatory Visit | Attending: Internal Medicine | Admitting: Internal Medicine

## 2012-08-25 VITALS — BP 130/64 | HR 83 | Temp 97.0°F | Ht 64.0 in | Wt 307.5 lb

## 2012-08-25 DIAGNOSIS — M79609 Pain in unspecified limb: Secondary | ICD-10-CM

## 2012-08-25 DIAGNOSIS — D649 Anemia, unspecified: Secondary | ICD-10-CM | POA: Diagnosis not present

## 2012-08-25 DIAGNOSIS — M545 Low back pain: Secondary | ICD-10-CM | POA: Diagnosis not present

## 2012-08-25 DIAGNOSIS — J441 Chronic obstructive pulmonary disease with (acute) exacerbation: Secondary | ICD-10-CM | POA: Diagnosis not present

## 2012-08-25 MED ORDER — PROMETHAZINE HCL 25 MG PO TABS: 25.0000 mg | ORAL_TABLET | Freq: Four times a day (QID) | ORAL | Status: DC | PRN

## 2012-08-25 MED ORDER — HYDROCODONE-ACETAMINOPHEN 5-325 MG PO TABS: 1.0000 | ORAL_TABLET | Freq: Four times a day (QID) | ORAL | Status: DC | PRN

## 2012-08-25 MED ORDER — LEVOFLOXACIN 250 MG PO TABS: 250.0000 mg | ORAL_TABLET | Freq: Every day | ORAL | Status: DC

## 2012-08-25 MED ORDER — NAPROXEN 500 MG PO TABS: 500.0000 mg | ORAL_TABLET | Freq: Two times a day (BID) | ORAL | Status: DC

## 2012-08-25 MED ORDER — PROMETHAZINE-CODEINE 6.25-10 MG/5ML PO SYRP: 5.0000 mL | ORAL_SOLUTION | Freq: Four times a day (QID) | ORAL | Status: DC | PRN

## 2012-08-25 MED ORDER — TOLTERODINE TARTRATE ER 4 MG PO CP24: 4.0000 mg | ORAL_CAPSULE | Freq: Every day | ORAL | Status: DC

## 2012-08-25 MED ORDER — PREDNISONE 10 MG PO TABS: ORAL_TABLET | ORAL | Status: DC
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
## 2012-08-25 NOTE — Patient Instructions (Addendum)
Please take all new medication as prescribed - the anti-inflammatory for pain, as well as the antibiotic, cough medicine, and prednisone) Please continue all other medications as before, and refills have been done if requested (the phenergan, as well as the Vicodin) I have sent a email to [place] in the office to let us know if she has handled the SCAT form I have sent a new prescription for Detrol LA 4 mg per day (generic); hopefully this will not have to be approved by your insurance, and it often works as well as the [employer] Please go to the [facility] in the Basement (go straight as you get off the elevator) for the x-ray testing - the right thumb You will be contacted regarding the referral for: hand surgeon Please keep your appointments with your specialists as you have planned  - Dr Everardo All for the sugar Thank you for enrolling in MyChart. Please follow the instructions below to securely access your online medical record. MyChart allows you to send messages to your doctor, view your test results, renew your prescriptions, schedule appointments, and more. To Log into My Chart online, please go by [employer] or [employer] to [email or web address], or download the MyChart App from the [employer] of [employer] .  Your Username is: [email or web address], (pass [email or web address]) Please send a practice Message on Mychart later today.

## 2012-08-26 ENCOUNTER — Encounter: Payer: Self-pay | Admitting: Internal Medicine

## 2012-08-26 ENCOUNTER — Other Ambulatory Visit: Payer: Self-pay | Admitting: Internal Medicine

## 2012-08-26 NOTE — Assessment & Plan Note (Signed)
Mild to mod, for antibx course,  to f/u any worsening symptoms or concerns 

## 2012-08-26 NOTE — Assessment & Plan Note (Signed)
Most c/w DJD flare of carpometacarpal right thumb, ongoing intermittent for over a yr, she thinks started after a fall that also injured her back;  For nsaid prn, refer hand surgury, check film today  Note:  Total time for pt hx, exam, review of record with pt in the room, determination of diagnoses and plan for further eval and tx is > 40 min, with over 50% spent in coordination and counseling of patient

## 2012-08-26 NOTE — Assessment & Plan Note (Signed)
Chronic recurrent, stable, cont same meds

## 2012-08-26 NOTE — Telephone Encounter (Signed)
error 

## 2012-08-26 NOTE — Assessment & Plan Note (Signed)
Mild to mod, for predpack asd,  to f/u any worsening symptoms or concerns 

## 2012-08-28 ENCOUNTER — Telehealth: Payer: Self-pay | Admitting: Internal Medicine

## 2012-08-28 MED ORDER — OXYBUTYNIN CHLORIDE ER 10 MG PO TB24: 10.0000 mg | ORAL_TABLET | Freq: Every day | ORAL | Status: DC

## 2012-08-28 NOTE — Telephone Encounter (Signed)
Per pt email:  The next best option is to try oxybutinin.  I will send a prescription.  I am not sure how to let family have access to your Mychart.  I will pass your concern about SCAT on to [facility] in the office.  Thanks ===View-only below this line===   ----- Message -----    From: Heron Sabins    Sent: 08/26/2012 12:49 PM EST      To: Oliver Barre, MD Subject: Non-Urgent Medical Question  Dr. Jonny Ruiz  The insurance refused the Detrol also.  Please call them concerning my need for Vesicare.  I would like for my daughter, Maxine Glenn La'Dale Gerlene Burdock to have access to MyChart  Can you have your records office contact me concerning the transportation status SCAT or it might be ADT to provide rides to doctor's office etc.    Thank You.

## 2012-08-29 ENCOUNTER — Telehealth: Payer: Self-pay | Admitting: Internal Medicine

## 2012-08-29 NOTE — Telephone Encounter (Signed)
Message copied by Newell Coral on Mon Aug 29, 2012 10:33 AM ------      Message from: Corwin Levins      Created: Thu Aug 25, 2012 12:35 PM      Regarding: ? scat form       Pt was asking about whether the SCAT form for her had been filled out, stated she had mailed it earlier            Would you know about this? ------

## 2012-08-29 NOTE — Telephone Encounter (Signed)
SCAT Forms completed and mailed to patient's home address, copy sent to batch scan to keep in medical chart.

## 2012-08-29 NOTE — Telephone Encounter (Signed)
Called the  Patient left detailed message concerning SCAT Forms.

## 2012-08-30 DIAGNOSIS — E1049 Type 1 diabetes mellitus with other diabetic neurological complication: Secondary | ICD-10-CM | POA: Diagnosis not present

## 2012-08-30 DIAGNOSIS — R627 Adult failure to thrive: Secondary | ICD-10-CM | POA: Diagnosis not present

## 2012-08-30 DIAGNOSIS — I10 Essential (primary) hypertension: Secondary | ICD-10-CM | POA: Diagnosis not present

## 2012-08-30 DIAGNOSIS — D649 Anemia, unspecified: Secondary | ICD-10-CM | POA: Diagnosis not present

## 2012-08-31 DIAGNOSIS — J441 Chronic obstructive pulmonary disease with (acute) exacerbation: Secondary | ICD-10-CM | POA: Diagnosis not present

## 2012-08-31 DIAGNOSIS — R627 Adult failure to thrive: Secondary | ICD-10-CM | POA: Diagnosis not present

## 2012-09-01 DIAGNOSIS — R627 Adult failure to thrive: Secondary | ICD-10-CM | POA: Diagnosis not present

## 2012-09-01 DIAGNOSIS — J441 Chronic obstructive pulmonary disease with (acute) exacerbation: Secondary | ICD-10-CM | POA: Diagnosis not present

## 2012-09-05 DIAGNOSIS — E1049 Type 1 diabetes mellitus with other diabetic neurological complication: Secondary | ICD-10-CM | POA: Diagnosis not present

## 2012-09-05 DIAGNOSIS — I10 Essential (primary) hypertension: Secondary | ICD-10-CM | POA: Diagnosis not present

## 2012-09-05 DIAGNOSIS — D649 Anemia, unspecified: Secondary | ICD-10-CM | POA: Diagnosis not present

## 2012-09-05 DIAGNOSIS — J441 Chronic obstructive pulmonary disease with (acute) exacerbation: Secondary | ICD-10-CM | POA: Diagnosis not present

## 2012-09-07 DIAGNOSIS — D649 Anemia, unspecified: Secondary | ICD-10-CM | POA: Diagnosis not present

## 2012-09-07 DIAGNOSIS — R627 Adult failure to thrive: Secondary | ICD-10-CM | POA: Diagnosis not present

## 2012-09-08 DIAGNOSIS — E1049 Type 1 diabetes mellitus with other diabetic neurological complication: Secondary | ICD-10-CM

## 2012-09-08 DIAGNOSIS — J441 Chronic obstructive pulmonary disease with (acute) exacerbation: Secondary | ICD-10-CM

## 2012-09-08 DIAGNOSIS — E1065 Type 1 diabetes mellitus with hyperglycemia: Secondary | ICD-10-CM

## 2012-09-08 DIAGNOSIS — J45901 Unspecified asthma with (acute) exacerbation: Secondary | ICD-10-CM

## 2012-09-08 DIAGNOSIS — I10 Essential (primary) hypertension: Secondary | ICD-10-CM

## 2012-09-09 DIAGNOSIS — D649 Anemia, unspecified: Secondary | ICD-10-CM | POA: Diagnosis not present

## 2012-09-09 DIAGNOSIS — I10 Essential (primary) hypertension: Secondary | ICD-10-CM | POA: Diagnosis not present

## 2012-09-09 DIAGNOSIS — R627 Adult failure to thrive: Secondary | ICD-10-CM | POA: Diagnosis not present

## 2012-09-09 DIAGNOSIS — E1049 Type 1 diabetes mellitus with other diabetic neurological complication: Secondary | ICD-10-CM | POA: Diagnosis not present

## 2012-09-09 DIAGNOSIS — J45901 Unspecified asthma with (acute) exacerbation: Secondary | ICD-10-CM | POA: Diagnosis not present

## 2012-09-12 DIAGNOSIS — R627 Adult failure to thrive: Secondary | ICD-10-CM | POA: Diagnosis not present

## 2012-09-12 DIAGNOSIS — I10 Essential (primary) hypertension: Secondary | ICD-10-CM | POA: Diagnosis not present

## 2012-09-12 DIAGNOSIS — E1049 Type 1 diabetes mellitus with other diabetic neurological complication: Secondary | ICD-10-CM | POA: Diagnosis not present

## 2012-09-12 DIAGNOSIS — J45901 Unspecified asthma with (acute) exacerbation: Secondary | ICD-10-CM | POA: Diagnosis not present

## 2012-09-12 DIAGNOSIS — D649 Anemia, unspecified: Secondary | ICD-10-CM | POA: Diagnosis not present

## 2012-09-13 DIAGNOSIS — I10 Essential (primary) hypertension: Secondary | ICD-10-CM | POA: Diagnosis not present

## 2012-09-13 DIAGNOSIS — D649 Anemia, unspecified: Secondary | ICD-10-CM | POA: Diagnosis not present

## 2012-09-13 DIAGNOSIS — R627 Adult failure to thrive: Secondary | ICD-10-CM | POA: Diagnosis not present

## 2012-09-13 DIAGNOSIS — J441 Chronic obstructive pulmonary disease with (acute) exacerbation: Secondary | ICD-10-CM | POA: Diagnosis not present

## 2012-09-13 DIAGNOSIS — E1065 Type 1 diabetes mellitus with hyperglycemia: Secondary | ICD-10-CM | POA: Diagnosis not present

## 2012-09-14 DIAGNOSIS — R627 Adult failure to thrive: Secondary | ICD-10-CM | POA: Diagnosis not present

## 2012-09-14 DIAGNOSIS — I10 Essential (primary) hypertension: Secondary | ICD-10-CM | POA: Diagnosis not present

## 2012-09-14 DIAGNOSIS — E1049 Type 1 diabetes mellitus with other diabetic neurological complication: Secondary | ICD-10-CM | POA: Diagnosis not present

## 2012-09-14 DIAGNOSIS — D649 Anemia, unspecified: Secondary | ICD-10-CM | POA: Diagnosis not present

## 2012-09-14 DIAGNOSIS — J45901 Unspecified asthma with (acute) exacerbation: Secondary | ICD-10-CM | POA: Diagnosis not present

## 2012-09-16 DIAGNOSIS — D649 Anemia, unspecified: Secondary | ICD-10-CM | POA: Diagnosis not present

## 2012-09-16 DIAGNOSIS — J45901 Unspecified asthma with (acute) exacerbation: Secondary | ICD-10-CM | POA: Diagnosis not present

## 2012-09-16 DIAGNOSIS — I10 Essential (primary) hypertension: Secondary | ICD-10-CM | POA: Diagnosis not present

## 2012-09-16 DIAGNOSIS — R627 Adult failure to thrive: Secondary | ICD-10-CM | POA: Diagnosis not present

## 2012-09-16 DIAGNOSIS — E1065 Type 1 diabetes mellitus with hyperglycemia: Secondary | ICD-10-CM | POA: Diagnosis not present

## 2012-09-19 DIAGNOSIS — J441 Chronic obstructive pulmonary disease with (acute) exacerbation: Secondary | ICD-10-CM | POA: Diagnosis not present

## 2012-09-19 DIAGNOSIS — R627 Adult failure to thrive: Secondary | ICD-10-CM | POA: Diagnosis not present

## 2012-09-19 DIAGNOSIS — D649 Anemia, unspecified: Secondary | ICD-10-CM | POA: Diagnosis not present

## 2012-09-19 DIAGNOSIS — E1065 Type 1 diabetes mellitus with hyperglycemia: Secondary | ICD-10-CM | POA: Diagnosis not present

## 2012-09-19 DIAGNOSIS — I10 Essential (primary) hypertension: Secondary | ICD-10-CM | POA: Diagnosis not present

## 2012-09-21 ENCOUNTER — Other Ambulatory Visit: Payer: Self-pay | Admitting: Internal Medicine

## 2012-09-21 DIAGNOSIS — J441 Chronic obstructive pulmonary disease with (acute) exacerbation: Secondary | ICD-10-CM | POA: Diagnosis not present

## 2012-09-21 DIAGNOSIS — I10 Essential (primary) hypertension: Secondary | ICD-10-CM | POA: Diagnosis not present

## 2012-09-21 DIAGNOSIS — R627 Adult failure to thrive: Secondary | ICD-10-CM | POA: Diagnosis not present

## 2012-09-21 DIAGNOSIS — D649 Anemia, unspecified: Secondary | ICD-10-CM | POA: Diagnosis not present

## 2012-09-21 DIAGNOSIS — E1049 Type 1 diabetes mellitus with other diabetic neurological complication: Secondary | ICD-10-CM | POA: Diagnosis not present

## 2012-09-23 ENCOUNTER — Telehealth: Payer: Self-pay | Admitting: Internal Medicine

## 2012-09-23 DIAGNOSIS — J441 Chronic obstructive pulmonary disease with (acute) exacerbation: Secondary | ICD-10-CM | POA: Diagnosis not present

## 2012-09-23 DIAGNOSIS — R627 Adult failure to thrive: Secondary | ICD-10-CM | POA: Diagnosis not present

## 2012-09-23 DIAGNOSIS — I10 Essential (primary) hypertension: Secondary | ICD-10-CM | POA: Diagnosis not present

## 2012-09-23 DIAGNOSIS — D649 Anemia, unspecified: Secondary | ICD-10-CM | POA: Diagnosis not present

## 2012-09-23 DIAGNOSIS — E1065 Type 1 diabetes mellitus with hyperglycemia: Secondary | ICD-10-CM | POA: Diagnosis not present

## 2012-09-23 MED ORDER — HYDROCODONE-ACETAMINOPHEN 5-325 MG PO TABS: 1.0000 | ORAL_TABLET | Freq: Four times a day (QID) | ORAL | Status: DC | PRN

## 2012-09-23 NOTE — Telephone Encounter (Signed)
Faxed again to [address] Called the patient informed of MD instructions on refill

## 2012-09-23 NOTE — Telephone Encounter (Signed)
Ok this time, but please let pt know - we normally do not refill lost prescriptions

## 2012-09-23 NOTE — Telephone Encounter (Signed)
Faxed hardcopy to pharmacy. 

## 2012-09-23 NOTE — Telephone Encounter (Signed)
Pt was given an RX for Hydrocodone on March 6.  She lost the RX.  She wants it sent into [facility] on [address].  States she is out of pain medicine.

## 2012-09-27 DIAGNOSIS — J441 Chronic obstructive pulmonary disease with (acute) exacerbation: Secondary | ICD-10-CM | POA: Diagnosis not present

## 2012-09-27 DIAGNOSIS — E1049 Type 1 diabetes mellitus with other diabetic neurological complication: Secondary | ICD-10-CM | POA: Diagnosis not present

## 2012-09-27 DIAGNOSIS — J45901 Unspecified asthma with (acute) exacerbation: Secondary | ICD-10-CM | POA: Diagnosis not present

## 2012-09-27 DIAGNOSIS — R627 Adult failure to thrive: Secondary | ICD-10-CM | POA: Diagnosis not present

## 2012-09-27 DIAGNOSIS — D649 Anemia, unspecified: Secondary | ICD-10-CM | POA: Diagnosis not present

## 2012-09-27 DIAGNOSIS — I10 Essential (primary) hypertension: Secondary | ICD-10-CM | POA: Diagnosis not present

## 2012-09-28 DIAGNOSIS — E1065 Type 1 diabetes mellitus with hyperglycemia: Secondary | ICD-10-CM | POA: Diagnosis not present

## 2012-09-28 DIAGNOSIS — J441 Chronic obstructive pulmonary disease with (acute) exacerbation: Secondary | ICD-10-CM | POA: Diagnosis not present

## 2012-09-28 DIAGNOSIS — R627 Adult failure to thrive: Secondary | ICD-10-CM | POA: Diagnosis not present

## 2012-09-28 DIAGNOSIS — D649 Anemia, unspecified: Secondary | ICD-10-CM | POA: Diagnosis not present

## 2012-09-28 DIAGNOSIS — I10 Essential (primary) hypertension: Secondary | ICD-10-CM | POA: Diagnosis not present

## 2012-09-30 DIAGNOSIS — J441 Chronic obstructive pulmonary disease with (acute) exacerbation: Secondary | ICD-10-CM | POA: Diagnosis not present

## 2012-09-30 DIAGNOSIS — E1065 Type 1 diabetes mellitus with hyperglycemia: Secondary | ICD-10-CM | POA: Diagnosis not present

## 2012-09-30 DIAGNOSIS — R627 Adult failure to thrive: Secondary | ICD-10-CM | POA: Diagnosis not present

## 2012-09-30 DIAGNOSIS — I10 Essential (primary) hypertension: Secondary | ICD-10-CM | POA: Diagnosis not present

## 2012-09-30 DIAGNOSIS — D649 Anemia, unspecified: Secondary | ICD-10-CM | POA: Diagnosis not present

## 2012-10-11 ENCOUNTER — Ambulatory Visit: Payer: Medicare Other | Admitting: Endocrinology

## 2012-10-11 DIAGNOSIS — Z0289 Encounter for other administrative examinations: Secondary | ICD-10-CM

## 2012-10-12 DIAGNOSIS — D649 Anemia, unspecified: Secondary | ICD-10-CM | POA: Diagnosis not present

## 2012-10-12 DIAGNOSIS — J45901 Unspecified asthma with (acute) exacerbation: Secondary | ICD-10-CM | POA: Diagnosis not present

## 2012-10-12 DIAGNOSIS — I10 Essential (primary) hypertension: Secondary | ICD-10-CM | POA: Diagnosis not present

## 2012-10-12 DIAGNOSIS — E1065 Type 1 diabetes mellitus with hyperglycemia: Secondary | ICD-10-CM | POA: Diagnosis not present

## 2012-10-12 DIAGNOSIS — R627 Adult failure to thrive: Secondary | ICD-10-CM | POA: Diagnosis not present

## 2012-10-20 ENCOUNTER — Telehealth: Payer: Self-pay | Admitting: Internal Medicine

## 2012-10-20 NOTE — Telephone Encounter (Signed)
Patient Information:  Caller Name: Tracia  Phone: [phone]  Patient: Christy May  Gender: Female  DOB: [birth year]  Age: 62 Years  PCP: Oliver Barre (Adults only)  Office Follow Up:  Does the office need to follow up with this patient?: No  Instructions For The Office: N/A  RN Note:  Pt denies injury. Advised Pt to call 911.  Pt states it's not cardiac related, states she has talked to Dr Jonny Ruiz about same pain in the past.Describes pain as excruciating.  Refuses 911, request appt. No same day appts w/ office.  PLEASE CALL PT BACK W/ SAME DAY APPT NEEDS.   Symptoms  Reason For Call & Symptoms: ER CALL. Chest Pain, center to right Breast  Reviewed Health History In EMR: N/A  Reviewed Medications In EMR: N/A  Reviewed Allergies In EMR: N/A  Reviewed Surgeries / Procedures: N/A  Date of Onset of Symptoms: 10/16/2012  Treatments Tried: Flexeril, oxycodone  Treatments Tried Worked: No  Guideline(s) Used:  Chest Pain  Disposition Per Guideline:   Call EMS 911 Now  Reason For Disposition Reached:   Chest pain lasting longer than 5 minutes and ANY of the following:  Over 57 years old Over 3 years old and at least one cardiac risk factor (i.e., high blood pressure, diabetes, high cholesterol, obesity, smoker or strong family history of heart disease) Pain is crushing, pressure-like, or heavy  Took nitroglycerin and chest pain was not relieved History of heart disease (i.e., angina, heart attack, bypass surgery, angioplasty, CHF)  Advice Given:  N/A  Patient Refused Recommendation:  Patient Refused Appt, Patient Requests Appt At Later Date  Pt request same day appt

## 2012-10-21 ENCOUNTER — Encounter (HOSPITAL_COMMUNITY): Payer: Self-pay | Admitting: *Deleted

## 2012-10-21 ENCOUNTER — Emergency Department (HOSPITAL_COMMUNITY)
Admission: EM | Admit: 2012-10-21 | Discharge: 2012-10-21 | Disposition: A | Discharge status: Home / Self Care | Payer: Medicare Other | Attending: Emergency Medicine | Admitting: Emergency Medicine

## 2012-10-21 ENCOUNTER — Emergency Department (HOSPITAL_COMMUNITY): Payer: Medicare Other

## 2012-10-21 ENCOUNTER — Telehealth: Payer: Self-pay | Admitting: Internal Medicine

## 2012-10-21 DIAGNOSIS — Z8709 Personal history of other diseases of the respiratory system: Secondary | ICD-10-CM | POA: Diagnosis not present

## 2012-10-21 DIAGNOSIS — R071 Chest pain on breathing: Secondary | ICD-10-CM | POA: Insufficient documentation

## 2012-10-21 DIAGNOSIS — Z8739 Personal history of other diseases of the musculoskeletal system and connective tissue: Secondary | ICD-10-CM | POA: Insufficient documentation

## 2012-10-21 DIAGNOSIS — Z8669 Personal history of other diseases of the nervous system and sense organs: Secondary | ICD-10-CM | POA: Diagnosis not present

## 2012-10-21 DIAGNOSIS — E119 Type 2 diabetes mellitus without complications: Secondary | ICD-10-CM | POA: Insufficient documentation

## 2012-10-21 DIAGNOSIS — Z87891 Personal history of nicotine dependence: Secondary | ICD-10-CM | POA: Insufficient documentation

## 2012-10-21 DIAGNOSIS — E669 Obesity, unspecified: Secondary | ICD-10-CM | POA: Insufficient documentation

## 2012-10-21 DIAGNOSIS — Z880 Allergy status to penicillin: Secondary | ICD-10-CM | POA: Insufficient documentation

## 2012-10-21 DIAGNOSIS — J441 Chronic obstructive pulmonary disease with (acute) exacerbation: Secondary | ICD-10-CM | POA: Diagnosis not present

## 2012-10-21 DIAGNOSIS — Z7982 Long term (current) use of aspirin: Secondary | ICD-10-CM | POA: Diagnosis not present

## 2012-10-21 DIAGNOSIS — K219 Gastro-esophageal reflux disease without esophagitis: Secondary | ICD-10-CM | POA: Diagnosis not present

## 2012-10-21 DIAGNOSIS — Z862 Personal history of diseases of the blood and blood-forming organs and certain disorders involving the immune mechanism: Secondary | ICD-10-CM | POA: Insufficient documentation

## 2012-10-21 DIAGNOSIS — F329 Major depressive disorder, single episode, unspecified: Secondary | ICD-10-CM | POA: Insufficient documentation

## 2012-10-21 DIAGNOSIS — IMO0002 Reserved for concepts with insufficient information to code with codable children: Secondary | ICD-10-CM | POA: Insufficient documentation

## 2012-10-21 DIAGNOSIS — R0602 Shortness of breath: Secondary | ICD-10-CM | POA: Diagnosis not present

## 2012-10-21 DIAGNOSIS — Z8719 Personal history of other diseases of the digestive system: Secondary | ICD-10-CM | POA: Diagnosis not present

## 2012-10-21 DIAGNOSIS — Z79899 Other long term (current) drug therapy: Secondary | ICD-10-CM | POA: Insufficient documentation

## 2012-10-21 DIAGNOSIS — Z8742 Personal history of other diseases of the female genital tract: Secondary | ICD-10-CM | POA: Diagnosis not present

## 2012-10-21 DIAGNOSIS — Z8639 Personal history of other endocrine, nutritional and metabolic disease: Secondary | ICD-10-CM | POA: Insufficient documentation

## 2012-10-21 DIAGNOSIS — F3289 Other specified depressive episodes: Secondary | ICD-10-CM | POA: Insufficient documentation

## 2012-10-21 DIAGNOSIS — I10 Essential (primary) hypertension: Secondary | ICD-10-CM | POA: Diagnosis not present

## 2012-10-21 DIAGNOSIS — Z794 Long term (current) use of insulin: Secondary | ICD-10-CM | POA: Diagnosis not present

## 2012-10-21 DIAGNOSIS — R0789 Other chest pain: Secondary | ICD-10-CM

## 2012-10-21 DIAGNOSIS — R079 Chest pain, unspecified: Secondary | ICD-10-CM | POA: Diagnosis not present

## 2012-10-21 LAB — D-DIMER, QUANTITATIVE: D-Dimer, Quant: 0.67 ug/mL-FEU — ABNORMAL HIGH (ref 0.00–0.48)

## 2012-10-21 LAB — COMPREHENSIVE METABOLIC PANEL
ALT: 18 U/L (ref 0–35)
AST: 23 U/L (ref 0–37)
Albumin: 3.1 g/dL — ABNORMAL LOW (ref 3.5–5.2)
Alkaline Phosphatase: 88 U/L (ref 39–117)
Calcium: 9.3 mg/dL (ref 8.4–10.5)
Glucose, Bld: 148 mg/dL — ABNORMAL HIGH (ref 70–99)
Potassium: 5 mEq/L (ref 3.5–5.1)
Sodium: 138 mEq/L (ref 135–145)
Total Protein: 6.9 g/dL (ref 6.0–8.3)

## 2012-10-21 LAB — CBC WITH DIFFERENTIAL/PLATELET
Basophils Absolute: 0 10*3/uL (ref 0.0–0.1)
Basophils Relative: 0 % (ref 0–1)
Eosinophils Absolute: 0.2 10*3/uL (ref 0.0–0.7)
Lymphs Abs: 2.2 10*3/uL (ref 0.7–4.0)
MCH: 27.8 pg (ref 26.0–34.0)
Neutrophils Relative %: 44 % (ref 43–77)
Platelets: 191 10*3/uL (ref 150–400)
RBC: 4.07 MIL/uL (ref 3.87–5.11)
RDW: 16.7 % — ABNORMAL HIGH (ref 11.5–15.5)

## 2012-10-21 LAB — POCT I-STAT TROPONIN I

## 2012-10-21 MED ORDER — MORPHINE SULFATE 4 MG/ML IJ SOLN
6.0000 mg | Freq: Once | INTRAMUSCULAR | Status: AC
  Administered 2012-10-21: 6 mg via INTRAMUSCULAR
  Filled 2012-10-21: qty 2

## 2012-10-21 MED ORDER — MORPHINE SULFATE 4 MG/ML IJ SOLN
4.0000 mg | Freq: Once | INTRAMUSCULAR | Status: AC
  Administered 2012-10-21: 4 mg via INTRAVENOUS
  Filled 2012-10-21: qty 1

## 2012-10-21 MED ORDER — ALBUTEROL SULFATE (5 MG/ML) 0.5% IN NEBU
5.0000 mg | INHALATION_SOLUTION | Freq: Once | RESPIRATORY_TRACT | Status: AC
  Administered 2012-10-21: 5 mg via RESPIRATORY_TRACT
  Filled 2012-10-21: qty 1

## 2012-10-21 MED ORDER — ONDANSETRON HCL 4 MG/2ML IJ SOLN: 4.0000 mg | Freq: Once | INTRAMUSCULAR | Status: DC

## 2012-10-21 MED ORDER — ONDANSETRON 8 MG PO TBDP
8.0000 mg | ORAL_TABLET | Freq: Once | ORAL | Status: AC
  Administered 2012-10-21: 8 mg via ORAL
  Filled 2012-10-21: qty 1

## 2012-10-21 MED ORDER — MORPHINE SULFATE 4 MG/ML IJ SOLN: 4.0000 mg | Freq: Once | INTRAMUSCULAR | Status: DC

## 2012-10-21 MED ORDER — HYDROCODONE-ACETAMINOPHEN 5-325 MG PO TABS: ORAL_TABLET | ORAL | Status: DC

## 2012-10-21 MED ORDER — IPRATROPIUM BROMIDE 0.02 % IN SOLN
0.5000 mg | Freq: Once | RESPIRATORY_TRACT | Status: AC
  Administered 2012-10-21: 0.5 mg via RESPIRATORY_TRACT
  Filled 2012-10-21: qty 2.5

## 2012-10-21 MED ORDER — IOHEXOL 350 MG/ML SOLN
100.0000 mL | Freq: Once | INTRAVENOUS | Status: AC | PRN
  Administered 2012-10-21: 100 mL via INTRAVENOUS

## 2012-10-21 NOTE — ED Provider Notes (Signed)
History     CSN: [id number]  Arrival date & time 10/21/12  1708   First MD Initiated Contact with Patient 10/21/12 1725      Chief Complaint  Patient presents with  . Chest Pain    (Consider location/radiation/quality/duration/timing/severity/associated sxs/prior treatment) HPI Comments: Patient with h/o CP, cath 03/14/2007 demonstrating no CAD, COPD/asthma, obesity -- presents with three-day history of right-sided chest pain that radiates to her mid chest, right breast, right upper abdomen. Patient has history of cholecystectomy, appendectomy, hysterectomy. Pain is made worse with different positions. It is described as sharp. She has had chest pain in the past but never this severe. She denies chest pain worsened with deep breathing. She denies lower extremity edema or history of pulmonary embolism. No fever, cough. No significant shortness of breath. No other treatments prior to arrival. Patient was told to come to the emergency department by her primary care physician. Onset of symptoms gradual. Course is constant. Nothing makes symptoms better.   Patient is a 62 y.o. female presenting with chest pain. The history is provided by the patient.  Chest Pain Associated symptoms: no abdominal pain, no back pain, no cough, no diaphoresis, no fever, no nausea, no palpitations, no shortness of breath and not vomiting     Past Medical History  Diagnosis Date  . Anemia, unspecified   . Extrinsic asthma, unspecified   . Chronic airway obstruction, not elsewhere classified   . Spondylosis of unspecified site without mention of myelopathy   . Dehydration   . Depressive disorder, not elsewhere classified   . Type II or unspecified type diabetes mellitus without mention of complication, not stated as uncontrolled   . Esophageal reflux   . Unspecified essential hypertension   . Unspecified menopausal and postmenopausal disorder   . Obesity, unspecified   . Inflammatory and toxic neuropathy,  unspecified   . Unspecified sleep apnea   . Unspecified vitamin D deficiency   . Dysphagia 2007    historyof dysphagia with severe dysmotility by barium swallow-[name]  . Obesity   . Hypertension   . Diabetes mellitus without complication   . Allergic bronchitis   . Sleep apnea     Past Surgical History  Procedure Laterality Date  . Laser sugery      bilateral eyes  . Back surgery      fusion-multiple cervical spine levels-Dr. Lovell Sheehan  . Excision of abcess      ?? Chest  . Dilation and curettage of uterus    . Exploratory laparotomy    . Partial hysterectomy    . Total knee arthroplasty  2005    Left  . Total knee arthroplasty  2005    Right- Dr. Turner Daniels  . Foot surgery      Right X 2  . Cholecystectomy    . Appendectomy    . Tonsillectomy    . Abdominal hysterectomy      Family History  Problem Relation Age of Onset  . Esophageal cancer Brother   . Esophageal cancer Sister     ?  . Colon polyps Brother   . Pancreatic cancer Sister     ?  . Diabetes Sister   . Diabetes      Aunt and Uncle  . Heart disease Maternal Grandfather     History  Substance Use Topics  . Smoking status: Former Smoker    Quit date: 06/22/1986  . Smokeless tobacco: Never Used  . Alcohol Use: No    OB History  Grav Para Term Preterm Abortions TAB SAB Ect Mult Living                  Review of Systems  Constitutional: Negative for fever and diaphoresis.  HENT: Negative for neck pain.   Eyes: Negative for redness.  Respiratory: Negative for cough and shortness of breath.   Cardiovascular: Positive for chest pain. Negative for palpitations and leg swelling.  Gastrointestinal: Negative for nausea, vomiting and abdominal pain.  Genitourinary: Negative for dysuria.  Musculoskeletal: Negative for back pain.  Skin: Negative for rash.  Neurological: Negative for syncope and light-headedness.    Allergies  Ace inhibitors; Ace inhibitors; Penicillins; Adhesive; Penicillins;  and Tape  Home Medications   Current Outpatient Rx  Name  Route  Sig  Dispense  Refill  . albuterol (PROVENTIL) (2.5 MG/3ML) 0.083% nebulizer solution   Nebulization   Take 2.5 mg by nebulization every 6 (six) hours as needed. Respiratory distress         . aspirin 81 MG tablet   Oral   Take 81 mg by mouth daily.           . Blood Glucose Monitoring Suppl (ONE TOUCH ULTRA MINI) W/DEVICE KIT   Does not apply   1 Device by Does not apply route once.         . cholecalciferol (VITAMIN D) 1000 UNITS tablet   Oral   Take 2,000 Units by mouth daily.           . Fluticasone-Salmeterol (ADVAIR DISKUS) 100-50 MCG/DOSE AEPB   Inhalation   Inhale 1 puff into the lungs every 12 (twelve) hours.           . gabapentin (NEURONTIN) 600 MG tablet      TAKE ONE TABLET BY MOUTH THREE TIMES DAILY   270 tablet   2   . glucose blood test strip   Other   1 each by Other route as needed. Use as instructed to check blood sugar twice daily dx 250.63         . HYDROcodone-acetaminophen (NORCO/VICODIN) 5-325 MG per tablet   Oral   Take 1 tablet by mouth every 6 (six) hours as needed. pain   60 tablet   1   . insulin NPH (HUMULIN N) 100 UNIT/ML injection   Subcutaneous   Inject 250 Units into the skin every morning.   9 vial   12   . irbesartan (AVAPRO) 300 MG tablet   Oral   Take 1 tablet (300 mg total) by mouth daily.   90 tablet   3   . Lancets (ONETOUCH ULTRASOFT) lancets   Other   1 each by Other route as needed. Use as instructed to check blood sugar twice daily dx 250.63         . levofloxacin (LEVAQUIN) 250 MG tablet   Oral   Take 1 tablet (250 mg total) by mouth daily.   10 tablet   0   . metoprolol (LOPRESSOR) 50 MG tablet      TAKE ONE AND ONE-HALF TABLETS BY MOUTH TWICE DAILY   270 tablet   3   . naproxen (NAPROSYN) 500 MG tablet   Oral   Take 1 tablet (500 mg total) by mouth 2 (two) times daily with a meal.   60 tablet   2   . omeprazole  (PRILOSEC) 20 MG capsule   Oral   Take 1 capsule (20 mg total) by mouth 2 (two) times daily.  180 capsule   3   . oxybutynin (DITROPAN-XL) 10 MG 24 hr tablet   Oral   Take 1 tablet (10 mg total) by mouth daily.   90 tablet   3   . polyethylene glycol (MIRALAX / GLYCOLAX) packet   Oral   Take 17 g by mouth daily as needed. constipation         . potassium chloride (K-DUR,KLOR-CON) 10 MEQ tablet   Oral   Take 10 mEq by mouth 2 (two) times daily.         . predniSONE (DELTASONE) 10 MG tablet      2 tabs by mouth per day for 7 days   14 tablet   0   . promethazine (PHENERGAN) 25 MG tablet   Oral   Take 1 tablet (25 mg total) by mouth every 6 (six) hours as needed. nausea   30 tablet   2   . promethazine-codeine (PHENERGAN WITH CODEINE) 6.25-10 MG/5ML syrup   Oral   Take 5 mLs by mouth every 6 (six) hours as needed for cough.   240 mL   0   . traMADol (ULTRAM-ER) 300 MG 24 hr tablet   Oral   Take 300 mg by mouth daily.           BP 158/63  Pulse 78  Temp(Src) 98.3 F (36.8 C) (Oral)  Resp 16  SpO2 96%  Physical Exam  Nursing note and vitals reviewed. Constitutional: She appears well-developed and well-nourished.  Obese  HENT:  Head: Normocephalic and atraumatic.  Eyes: Conjunctivae are normal. Right eye exhibits no discharge. Left eye exhibits no discharge.  Neck: Normal range of motion. Neck supple.  Cardiovascular: Normal rate, regular rhythm and normal heart sounds.   No murmur heard. Pulmonary/Chest: Effort normal. No respiratory distress. She has wheezes (Mild expiratory). She has no rales.  Abdominal: Soft. She exhibits no distension. There is no tenderness. There is no rebound and no guarding.  Neurological: She is alert.  Skin: Skin is warm and dry.  Psychiatric: She has a normal mood and affect.    ED Course  Procedures (including critical care time)  Labs Reviewed  CBC WITH DIFFERENTIAL - Abnormal; Notable for the following:     Hemoglobin 11.3 (*)    HCT 34.6 (*)    RDW 16.7 (*)    All other components within normal limits  COMPREHENSIVE METABOLIC PANEL - Abnormal; Notable for the following:    Glucose, Bld 148 (*)    Albumin 3.1 (*)    Total Bilirubin 0.2 (*)    GFR calc non Af Amer 60 (*)    GFR calc Af Amer 69 (*)    All other components within normal limits  LIPASE, BLOOD  D-DIMER, QUANTITATIVE  POCT I-STAT TROPONIN I   Dg Chest 2 View  10/21/2012  *RADIOLOGY REPORT*  Clinical Data: Chest pain  CHEST - 2 VIEW  Comparison: 04/17/2012  Findings: Lungs are essentially clear.  No pleural effusion or pneumothorax.  The heart is top normal in size.  Degenerative changes of the visualized thoracolumbar spine.  Cervical spine fixation hardware.  IMPRESSION: No evidence of acute cardiopulmonary disease.   Original Report Authenticated By: Charline Bills, M.D.      1. Chest wall pain     6:00 PM Patient seen and examined. Work-up initiated. Medications ordered.   Vital signs reviewed and are as follows: Filed Vitals:   10/21/12 1719  BP: 158/63  Pulse: 78  Temp: 98.3  F (36.8 C)  Resp: 16    Date: 10/21/2012  Rate: 78  Rhythm: normal sinus rhythm  QRS Axis: normal  Intervals: normal  ST/T Wave abnormalities: nonspecific T wave changes  Conduction Disutrbances:none  Narrative Interpretation:   Old EKG Reviewed: unchanged from 06/14/12 except for resolution of tachycardia  8:22 PM Pt d/w and seen by Dr. Ethelda Chick. Feel this is chest wall pain. Awaiting d-dimer to r/o PE.   Handoff at shift change to [facility]. Plan: If d-dimer neg, d/c to home with pain control. If pos, will need CT to r/o PE.   Patient was counseled to return with severe chest pain, especially if the pain is crushing or pressure-like and spreads to the arms, back, neck, or jaw, or if they have sweating, nausea, or shortness of breath with the pain. They were encouraged to call 911 with these symptoms.   They were also told to  return if their chest pain gets worse and does not go away with rest, they have an attack of chest pain lasting longer than usual despite rest and treatment with the medications their caregiver has prescribed, if they wake from sleep with chest pain or shortness of breath, if they feel dizzy or faint, if they have chest pain not typical of their usual pain, or if they have any other emergent concerns regarding their health.  The patient verbalized understanding and agreed.   Patient counseled on use of narcotic pain medications. Counseled not to combine these medications with others containing tylenol. Urged not to drink alcohol, drive, or perform any other activities that requires focus while taking these medications. The patient verbalizes understanding and agrees with the plan.    MDM  Chest pain, reproducible, with musculoskeletal type components. Pt will need pain control and PCP f/u. Trop neg. Pending d-dimer. EKG non-ischemic. CXR neg. VSS.        Renne Crigler, PA-C 10/21/12 2026

## 2012-10-21 NOTE — ED Notes (Signed)
Dr Felix Pacini at bedside,

## 2012-10-21 NOTE — ED Provider Notes (Signed)
Medical screening examination/treatment/procedure(s) were conducted as a shared visit with non-physician practitioner(s) and myself.  I personally evaluated the patient during the encounter  Doug Sou, MD 10/21/12 2229

## 2012-10-21 NOTE — ED Provider Notes (Signed)
Medical screening examination/treatment/procedure(s) were conducted as a shared visit with non-physician practitioner(s) and myself.  I personally evaluated the patient during the encounter  Doug Sou, MD 10/21/12 2227

## 2012-10-21 NOTE — ED Notes (Signed)
PA at bedside.

## 2012-10-21 NOTE — Telephone Encounter (Signed)
Patient Information:  Caller Name: Meleah  Phone: [phone]  Patient: Christy May, Christy May  Gender: Female  DOB: 20-Sep-1950  Age: 62 Years  PCP: Oliver Barre (Adults only)  Office Follow Up:  Does the office need to follow up with this patient?: Yes  Instructions For The Office: If there are any cancellations or openings please call pt. waiting since yesterday for a call back.   Symptoms  Reason For Call & Symptoms: Pt called yesterday with chest pain sx. Note was sent to call the pt back but she hasn't heard anything. Pt states the chest pain is severe today. It is in the center of her chest and going through to her back. This am she is having difficulty breathing. RN advised 911. Pt refused. She insists on appt for what she feels is "bronchitis". RN checked EPIC and did see request for same day appt yesterday. Pt calling for same today. Appts full.  Reviewed Health History In EMR: Yes  Reviewed Medications In EMR: Yes  Reviewed Allergies In EMR: Yes  Reviewed Surgeries / Procedures: Yes  Date of Onset of Symptoms: 10/05/2012  Guideline(s) Used:  Chest Pain  Disposition Per Guideline:   Call EMS 911 Now  Reason For Disposition Reached:   Chest pain lasting longer than 5 minutes and ANY of the following:  Over 22 years old Over 15 years old and at least one cardiac risk factor (i.e., high blood pressure, diabetes, high cholesterol, obesity, smoker or strong family history of heart disease) Pain is crushing, pressure-like, or heavy  Took nitroglycerin and chest pain was not relieved History of heart disease (i.e., angina, heart attack, bypass surgery, angioplasty, CHF)  Advice Given:  N/A  Patient Refused Recommendation:  Patient Refused Care Advice  Wants to be seen in the office. RN checked/appts are fully booked. Pt advised her sx are emergent and she should be evaluated now. Pt will go to ED

## 2012-10-21 NOTE — ED Provider Notes (Signed)
Patient care assumed from [facility], PA-C at shift change.  Patient's d-dimer elevated to 0.67. CT angiogram ordered to further evaluate for pulmonary embolism. Patient resting comfortably at this time with no complaints; hemodynamically stable.  CT angio negative for evidence of pulmonary embolism. Patient's pain well controlled with IV morphine in the ED; no hypoxia and VSS. Patient appropriate for discharge with primary care followup with dx of chest wall pain. Norco prescribed by Rhea Bleacher for pain symptoms. Indications for ED return provided. Patient states comfort and understanding with this d/c plan with no unaddressed concerns.   Results for orders placed during the hospital encounter of 10/21/12  CBC WITH DIFFERENTIAL      Result Value Range   WBC 5.0  4.0 - 10.5 K/uL   RBC 4.07  3.87 - 5.11 MIL/uL   Hemoglobin 11.3 (*) 12.0 - 15.0 g/dL   HCT 16.1 (*) 09.6 - 04.5 %   MCV 85.0  78.0 - 100.0 fL   MCH 27.8  26.0 - 34.0 pg   MCHC 32.7  30.0 - 36.0 g/dL   RDW 40.9 (*) 81.1 - 91.4 %   Platelets 191  150 - 400 K/uL   Neutrophils Relative 44  43 - 77 %   Neutro Abs 2.2  1.7 - 7.7 K/uL   Lymphocytes Relative 43  12 - 46 %   Lymphs Abs 2.2  0.7 - 4.0 K/uL   Monocytes Relative 8  3 - 12 %   Monocytes Absolute 0.4  0.1 - 1.0 K/uL   Eosinophils Relative 5  0 - 5 %   Eosinophils Absolute 0.2  0.0 - 0.7 K/uL   Basophils Relative 0  0 - 1 %   Basophils Absolute 0.0  0.0 - 0.1 K/uL  COMPREHENSIVE METABOLIC PANEL      Result Value Range   Sodium 138  135 - 145 mEq/L   Potassium 5.0  3.5 - 5.1 mEq/L   Chloride 105  96 - 112 mEq/L   CO2 25  19 - 32 mEq/L   Glucose, Bld 148 (*) 70 - 99 mg/dL   BUN 22  6 - 23 mg/dL   Creatinine, Ser 7.82  0.50 - 1.10 mg/dL   Calcium 9.3  8.4 - 95.6 mg/dL   Total Protein 6.9  6.0 - 8.3 g/dL   Albumin 3.1 (*) 3.5 - 5.2 g/dL   AST 23  0 - 37 U/L   ALT 18  0 - 35 U/L   Alkaline Phosphatase 88  39 - 117 U/L   Total Bilirubin 0.2 (*) 0.3 - 1.2 mg/dL   GFR calc non Af Amer 60 (*) >90 mL/min   GFR calc Af Amer 69 (*) >90 mL/min  LIPASE, BLOOD      Result Value Range   Lipase 20  11 - 59 U/L  D-DIMER, QUANTITATIVE      Result Value Range   D-Dimer, Quant 0.67 (*) 0.00 - 0.48 ug/mL-FEU  POCT I-STAT TROPONIN I      Result Value Range   Troponin i, poc 0.01  0.00 - 0.08 ng/mL   Comment 3            Dg Chest 2 View  10/21/2012  *RADIOLOGY REPORT*  Clinical Data: Chest pain  CHEST - 2 VIEW  Comparison: 04/17/2012  Findings: Lungs are essentially clear.  No pleural effusion or pneumothorax.  The heart is top normal in size.  Degenerative changes of the visualized thoracolumbar spine.  Cervical  spine fixation hardware.  IMPRESSION: No evidence of acute cardiopulmonary disease.   Original Report Authenticated By: Charline Bills, M.D.    Ct Angio Chest Pe W/cm &/or Wo Cm  10/21/2012  *RADIOLOGY REPORT*  Clinical Data: Intermittent right chest pain, shortness of breath, elevated D-dimer  CT ANGIOGRAPHY CHEST  Technique:  Multidetector CT imaging of the chest using the standard protocol during bolus administration of intravenous contrast. Multiplanar reconstructed images including MIPs were obtained and reviewed to evaluate the vascular anatomy.  Contrast: OMNIPAQUE IOHEXOL 350 MG/ML SOLN  Comparison: Chest radiograph dated 10/21/2012  Findings: No evidence of pulmonary embolism.  Linear scarring versus atelectasis in the lingula and right middle lobe.  Mild atelectasis versus scarring in the bilateral lower lobes.  No pleural effusion or pneumothorax.  Visualized thyroid is unremarkable.  The heart is normal in size.  No pericardial effusion.  No suspicious mediastinal, hilar, or axillary lymphadenopathy.  Visualized upper abdomen is unremarkable.  Degenerative changes of the visualized thoracolumbar spine. Cervical spine fixation hardware.  IMPRESSION: No evidence of pulmonary embolism.  No evidence of acute cardiopulmonary disease.   Original Report  Authenticated By: Charline Bills, M.D.       Antony Madura, PA-C 10/21/12 2207

## 2012-10-21 NOTE — ED Notes (Signed)
Pt c/o right sided chest pain "off and on" for a few months. Reports pain radiates to back. Reports SOB denies n/v.

## 2012-10-21 NOTE — ED Provider Notes (Signed)
Right-sided anterior chest pain onset 3 days ago. Patient has had similar pain intermittently since 2006. It was determined that she had musculoskeletal pain. She is treated herself with oxycodone with relief. Pain is worse with changing positions improved with remaining still. On exam alert nontoxic lungs clear auscultation right chest is exquisitely painful when she sits up from a supine position heart regular rate and rhythm no murmurs or rubs abdomen obese nontender  Doug Sou, MD 10/21/12 2008

## 2012-10-24 DIAGNOSIS — J45901 Unspecified asthma with (acute) exacerbation: Secondary | ICD-10-CM | POA: Diagnosis not present

## 2012-10-24 DIAGNOSIS — R627 Adult failure to thrive: Secondary | ICD-10-CM | POA: Diagnosis not present

## 2012-10-24 DIAGNOSIS — E1049 Type 1 diabetes mellitus with other diabetic neurological complication: Secondary | ICD-10-CM | POA: Diagnosis not present

## 2012-10-24 DIAGNOSIS — M5126 Other intervertebral disc displacement, lumbar region: Secondary | ICD-10-CM | POA: Diagnosis not present

## 2012-10-24 DIAGNOSIS — I10 Essential (primary) hypertension: Secondary | ICD-10-CM | POA: Diagnosis not present

## 2012-10-24 DIAGNOSIS — D649 Anemia, unspecified: Secondary | ICD-10-CM | POA: Diagnosis not present

## 2012-10-24 DIAGNOSIS — M5106 Intervertebral disc disorders with myelopathy, lumbar region: Secondary | ICD-10-CM | POA: Diagnosis not present

## 2012-11-01 DIAGNOSIS — E119 Type 2 diabetes mellitus without complications: Secondary | ICD-10-CM | POA: Diagnosis not present

## 2012-11-01 DIAGNOSIS — H2510 Age-related nuclear cataract, unspecified eye: Secondary | ICD-10-CM | POA: Diagnosis not present

## 2012-11-02 ENCOUNTER — Other Ambulatory Visit: Payer: Self-pay | Admitting: Internal Medicine

## 2012-11-03 DIAGNOSIS — B351 Tinea unguium: Secondary | ICD-10-CM | POA: Diagnosis not present

## 2012-11-03 DIAGNOSIS — M7660 Achilles tendinitis, unspecified leg: Secondary | ICD-10-CM | POA: Diagnosis not present

## 2012-11-03 DIAGNOSIS — E1149 Type 2 diabetes mellitus with other diabetic neurological complication: Secondary | ICD-10-CM | POA: Diagnosis not present

## 2012-11-03 DIAGNOSIS — Q828 Other specified congenital malformations of skin: Secondary | ICD-10-CM | POA: Diagnosis not present

## 2012-11-10 DIAGNOSIS — M5106 Intervertebral disc disorders with myelopathy, lumbar region: Secondary | ICD-10-CM | POA: Diagnosis not present

## 2012-11-10 DIAGNOSIS — M5126 Other intervertebral disc displacement, lumbar region: Secondary | ICD-10-CM | POA: Diagnosis not present

## 2012-11-11 ENCOUNTER — Other Ambulatory Visit: Payer: Self-pay

## 2012-11-11 MED ORDER — HYDROCODONE-ACETAMINOPHEN 5-325 MG PO TABS: ORAL_TABLET | ORAL | Status: DC

## 2012-11-11 NOTE — Telephone Encounter (Signed)
Done hardcopy to robin  

## 2012-11-11 NOTE — Telephone Encounter (Signed)
Faxed hardcopy to [employer]. [place]

## 2012-11-15 ENCOUNTER — Telehealth: Payer: Self-pay | Admitting: Internal Medicine

## 2012-11-15 NOTE — Telephone Encounter (Signed)
Patient Information:  Caller Name: Shaya  Phone: [phone]  Patient: Christy May, Christy May  Gender: Female  DOB: 1950/10/14  Age: 62 Years  PCP: Oliver Barre (Adults only)  Office Follow Up:  Does the office need to follow up with this patient?: Yes  Instructions For The Office: ED/911 declined d/t ongoing chest wall pain and has been evaluated at ED and no changes since seen at ED.   Caller wants an appt.  RN Note:  Advsied of 911/ED and caller states she is not going to go since she has been there and needs further evaluation.  Caller got upset about the disp and wants an appt to see Dr. Jonny Ruiz.  Per ED notes caller was to f/u with Provider and pt states she was unaware she needed to f/u w/Dr. Jonny Ruiz.  Symptoms  Reason For Call & Symptoms: Pt seen in ED for chest pain on 10/21/2012.  Pt experiencing right sided chest pain around the breast and into the back.  No known injury.  Movement aggravates the pain. Pain is constant and feels like chest pressure.   No shortness of breath, nausea/vomiting.   8/10.  Caller thinks she needs an ultrasound of her chest to find out what is causing the pain.   Reviewed Health History In EMR: yes  Reviewed Medications In EMR: yes, pt is drinking a tea to help with her constipation  Reviewed Allergies In EMR: yes  Reviewed Surgeries / Procedures: No  Date of Onset of Symptoms: 10/26/2012  Treatments Tried: prescribed pain medication  Treatments Tried Worked: No  Guideline(s) Used:  Chest Pain  Disposition Per Guideline:   Call EMS 911 Now  Reason For Disposition Reached:   Chest pain lasting longer than 5 minutes and ANY of the following:  Over 51 years old Over 27 years old and at least one cardiac risk factor (i.e., high blood pressure, diabetes, high cholesterol, obesity, smoker or strong family history of heart disease) Pain is crushing, pressure-like, or heavy  Took nitroglycerin and chest pain was not relieved History of heart disease (i.e.,  angina, heart attack, bypass surgery, angioplasty, CHF)  Advice Given:  Call Back If:  Difficulty breathing  Fever  You become worse.  Patient Refused Recommendation:  Patient Refused Care Advice  Caller requests an appt.

## 2012-11-15 NOTE — Telephone Encounter (Signed)
Ok for appt with me or regina next available

## 2012-11-15 NOTE — Telephone Encounter (Signed)
Appt with Dr. Jonny Ruiz on May 28 at 8:15.

## 2012-11-16 ENCOUNTER — Ambulatory Visit (INDEPENDENT_AMBULATORY_CARE_PROVIDER_SITE_OTHER): Payer: Medicare Other | Admitting: Internal Medicine

## 2012-11-16 ENCOUNTER — Encounter: Payer: Self-pay | Admitting: Internal Medicine

## 2012-11-16 VITALS — BP 112/80 | HR 86 | Temp 97.3°F | Ht 64.0 in | Wt 316.2 lb

## 2012-11-16 DIAGNOSIS — R0789 Other chest pain: Secondary | ICD-10-CM | POA: Insufficient documentation

## 2012-11-16 DIAGNOSIS — E11319 Type 2 diabetes mellitus with unspecified diabetic retinopathy without macular edema: Secondary | ICD-10-CM | POA: Diagnosis not present

## 2012-11-16 DIAGNOSIS — R071 Chest pain on breathing: Secondary | ICD-10-CM | POA: Diagnosis not present

## 2012-11-16 DIAGNOSIS — E669 Obesity, unspecified: Secondary | ICD-10-CM | POA: Diagnosis not present

## 2012-11-16 DIAGNOSIS — H04129 Dry eye syndrome of unspecified lacrimal gland: Secondary | ICD-10-CM | POA: Diagnosis not present

## 2012-11-16 DIAGNOSIS — I10 Essential (primary) hypertension: Secondary | ICD-10-CM | POA: Diagnosis not present

## 2012-11-16 DIAGNOSIS — J449 Chronic obstructive pulmonary disease, unspecified: Secondary | ICD-10-CM | POA: Diagnosis not present

## 2012-11-16 MED ORDER — PHENTERMINE HCL 37.5 MG PO CAPS: 37.5000 mg | ORAL_CAPSULE | ORAL | Status: DC

## 2012-11-16 MED ORDER — COLCHICINE 0.6 MG PO TABS: 0.6000 mg | ORAL_TABLET | Freq: Every day | ORAL | Status: DC

## 2012-11-16 MED ORDER — HYDROCODONE-ACETAMINOPHEN 7.5-325 MG PO TABS: 1.0000 | ORAL_TABLET | Freq: Four times a day (QID) | ORAL | Status: DC | PRN

## 2012-11-16 NOTE — Assessment & Plan Note (Signed)
Ok for phentermine 37.5 mg trial

## 2012-11-16 NOTE — Patient Instructions (Signed)
Please take all new medication as prescribed  - the hydrocodone 7/5/325 mg as needed, colchicine for anti-inflammatory, and phentermine for wt loss Please continue all other medications as before Please have the pharmacy call with any other refills you may need. Please continue your efforts at being more active, low cholesterol diet, and weight control.

## 2012-11-16 NOTE — Assessment & Plan Note (Signed)
stable overall by history and exam, recent data reviewed with pt, and pt to continue medical treatment as before,  to f/u any worsening symptoms or concerns BP Readings from Last 3 Encounters:  11/16/12 112/80  10/21/12 129/55  08/25/12 130/64

## 2012-11-16 NOTE — Progress Notes (Signed)
Subjective:    Patient ID: Christy May, female    DOB: 01-23-1951, 62 y.o.   MRN: [id number]  HPI  Here to fu with CP, was seen in ER with CT neg for acute after slight elev d dimer, ECG nondiagnostic, and stable labs as on EMR may 2. Pt states no mod to severe, constant, pleuritic, located right parasternal from midsternum and extending to the riight costal margin towards the right flank area. Had cardiac eval with neg stress test.  Vicodin 5 /325 helped take edge off. Unable to lose wt, hard to bend forward due to the pain. Pt denies other chest pain, increased sob or doe, wheezing, orthopnea, PND, increased LE swelling, palpitations, dizziness or syncope. Denies worsening depressive symptoms, suicidal ideation, or panic; has ongoing anxiety   Past Medical History  Diagnosis Date  . Anemia, unspecified   . Extrinsic asthma, unspecified   . Chronic airway obstruction, not elsewhere classified   . Spondylosis of unspecified site without mention of myelopathy   . Dehydration   . Depressive disorder, not elsewhere classified   . Type II or unspecified type diabetes mellitus without mention of complication, not stated as uncontrolled   . Esophageal reflux   . Unspecified essential hypertension   . Unspecified menopausal and postmenopausal disorder   . Obesity, unspecified   . Inflammatory and toxic neuropathy, unspecified   . Unspecified sleep apnea   . Unspecified vitamin D deficiency   . Dysphagia 2007    historyof dysphagia with severe dysmotility by barium swallow-[name]  . Obesity   . Hypertension   . Diabetes mellitus without complication   . Allergic bronchitis   . Sleep apnea    Past Surgical History  Procedure Laterality Date  . Laser sugery      bilateral eyes  . Back surgery      fusion-multiple cervical spine levels-Dr. Lovell Sheehan  . Excision of abcess      ?? Chest  . Dilation and curettage of uterus    . Exploratory laparotomy    . Partial hysterectomy    .  Total knee arthroplasty  2005    Left  . Total knee arthroplasty  2005    Right- Dr. Turner Daniels  . Foot surgery      Right X 2  . Cholecystectomy    . Appendectomy    . Tonsillectomy    . Abdominal hysterectomy      reports that she quit smoking about 26 years ago. She has never used smokeless tobacco. She reports that she does not drink alcohol or use illicit drugs. family history includes Colon polyps in her brother; Diabetes in her sister and unspecified family member; Esophageal cancer in her brother and sister; Heart disease in her maternal grandfather; and Pancreatic cancer in her sister. Allergies  Allergen Reactions  . Ace Inhibitors Anaphylaxis  . Ace Inhibitors Anaphylaxis  . Penicillins Hives and Rash  . Adhesive (Tape)   . Penicillins     Unknown   . Tape Hives   Current Outpatient Prescriptions on File Prior to Visit  Medication Sig Dispense Refill  . albuterol (PROVENTIL) (2.5 MG/3ML) 0.083% nebulizer solution Take 2.5 mg by nebulization every 6 (six) hours as needed. Respiratory distress      . aspirin 81 MG tablet Take 81 mg by mouth daily.        . Blood Glucose Monitoring Suppl (ONE TOUCH ULTRA MINI) W/DEVICE KIT 1 Device by Does not apply route once.      Marland Kitchen  cholecalciferol (VITAMIN D) 1000 UNITS tablet Take 2,000 Units by mouth daily.        . cyclobenzaprine (FLEXERIL) 5 MG tablet TAKE ONE TABLET BY MOUTH THREE TIMES DAILY AS NEEDED FOR MUSCLE SPASM  60 tablet  5  . Fluticasone-Salmeterol (ADVAIR DISKUS) 100-50 MCG/DOSE AEPB Inhale 1 puff into the lungs every 12 (twelve) hours.        . gabapentin (NEURONTIN) 600 MG tablet TAKE ONE TABLET BY MOUTH THREE TIMES DAILY  270 tablet  2  . glucose blood test strip 1 each by Other route as needed. Use as instructed to check blood sugar twice daily dx 250.63      . HYDROcodone-acetaminophen (NORCO/VICODIN) 5-325 MG per tablet Take 1 tablet by mouth every 6 (six) hours as needed. pain  60 tablet  1  . HYDROcodone-acetaminophen  (NORCO/VICODIN) 5-325 MG per tablet Take 1-2 tablets every 6 hours as needed for severe pain  60 tablet  0  . insulin NPH (HUMULIN N) 100 UNIT/ML injection Inject 250 Units into the skin every morning.  9 vial  12  . irbesartan (AVAPRO) 300 MG tablet Take 1 tablet (300 mg total) by mouth daily.  90 tablet  3  . Lancets (ONETOUCH ULTRASOFT) lancets 1 each by Other route as needed. Use as instructed to check blood sugar twice daily dx 250.63      . metoprolol (LOPRESSOR) 50 MG tablet TAKE ONE AND ONE-HALF TABLETS BY MOUTH TWICE DAILY  270 tablet  3  . naproxen (NAPROSYN) 500 MG tablet Take 1 tablet (500 mg total) by mouth 2 (two) times daily with a meal.  60 tablet  2  . omeprazole (PRILOSEC) 20 MG capsule Take 1 capsule (20 mg total) by mouth 2 (two) times daily.  180 capsule  3  . oxybutynin (DITROPAN-XL) 10 MG 24 hr tablet Take 1 tablet (10 mg total) by mouth daily.  90 tablet  3  . oxyCODONE-acetaminophen (PERCOCET) 10-325 MG per tablet Take 0.5-1 tablets by mouth every 4 (four) hours as needed for pain. Take 1/2 to 1 tablet every  4-6 hours      . potassium chloride (K-DUR,KLOR-CON) 10 MEQ tablet Take 10 mEq by mouth 2 (two) times daily.      . promethazine (PHENERGAN) 25 MG tablet Take 1 tablet (25 mg total) by mouth every 6 (six) hours as needed. nausea  30 tablet  2  . promethazine-codeine (PHENERGAN WITH CODEINE) 6.25-10 MG/5ML syrup Take 5 mLs by mouth every 6 (six) hours as needed for cough.  240 mL  0  . traMADol (ULTRAM-ER) 300 MG 24 hr tablet Take 300 mg by mouth daily.       No current facility-administered medications on file prior to visit.   Review of Systems  Constitutional: Negative for unexpected weight change, or unusual diaphoresis  HENT: Negative for tinnitus.   Eyes: Negative for photophobia and visual disturbance.  Respiratory: Negative for choking and stridor.   Gastrointestinal: Negative for vomiting and blood in stool.  Genitourinary: Negative for hematuria and  decreased urine volume.  Musculoskeletal: Negative for acute joint swelling Skin: Negative for color change and wound.  Neurological: Negative for tremors and numbness other than noted  Psychiatric/Behavioral: Negative for decreased concentration or  hyperactivity.       Objective:   Physical Exam BP 112/80  Pulse 86  Temp(Src) 97.3 F (36.3 C) (Oral)  Ht 5\' 4"  (1.626 m)  Wt 316 lb 4 oz (143.45 kg)  BMI 54.26 kg/m2  SpO2 97% VS noted,  Constitutional: Pt appears well-developed and well-nourished.  HENT: Head: NCAT.  Right Ear: External ear normal.  Left Ear: External ear normal.  Eyes: Conjunctivae and EOM are normal. Pupils are equal, round, and reactive to light.  Neck: Normal range of motion. Neck supple.  Cardiovascular: Normal rate and regular rhythm.   Pulmonary/Chest: Effort normal and breath sounds normal.  Abd:  Soft, NT, non-distended, + BS Has tender right parasternal area and right costal margin Neurological: Pt is alert. Not confused  Skin: Skin is warm. No erythema.  Psychiatric: Pt behavior is normal. Thought content normal. 1+ nervous    Assessment & Plan:

## 2012-11-16 NOTE — Assessment & Plan Note (Signed)
D/w pt, pt is concerned and upset/somewhat tearful there are no further testing needed; will incr vicodin 7.5 prn, colchicine for anti-inflammatory daily, and tried to reassure pt

## 2012-11-16 NOTE — Assessment & Plan Note (Signed)
stable overall by history and exam, recent data reviewed with pt, and pt to continue medical treatment as before,  to f/u any worsening symptoms or concerns SpO2 Readings from Last 3 Encounters:  11/16/12 97%  10/21/12 100%  08/25/12 97%

## 2012-11-25 ENCOUNTER — Other Ambulatory Visit: Payer: Self-pay | Admitting: Internal Medicine

## 2012-11-28 ENCOUNTER — Encounter: Payer: Self-pay | Admitting: Internal Medicine

## 2012-11-28 ENCOUNTER — Ambulatory Visit (INDEPENDENT_AMBULATORY_CARE_PROVIDER_SITE_OTHER): Payer: Medicare Other | Admitting: Internal Medicine

## 2012-11-28 DIAGNOSIS — J441 Chronic obstructive pulmonary disease with (acute) exacerbation: Secondary | ICD-10-CM | POA: Insufficient documentation

## 2012-11-28 DIAGNOSIS — I10 Essential (primary) hypertension: Secondary | ICD-10-CM

## 2012-11-28 DIAGNOSIS — M130 Polyarthritis, unspecified: Secondary | ICD-10-CM

## 2012-11-28 DIAGNOSIS — J069 Acute upper respiratory infection, unspecified: Secondary | ICD-10-CM | POA: Diagnosis not present

## 2012-11-28 MED ORDER — PREDNISONE 10 MG PO TABS: 10.0000 mg | ORAL_TABLET | Freq: Every day | ORAL | Status: DC

## 2012-11-28 MED ORDER — METHYLPREDNISOLONE ACETATE 80 MG/ML IJ SUSP
80.0000 mg | Freq: Once | INTRAMUSCULAR | Status: AC
  Administered 2012-11-28: 80 mg via INTRAMUSCULAR

## 2012-11-28 MED ORDER — OXYCODONE HCL 5 MG PO TABS: 5.0000 mg | ORAL_TABLET | ORAL | Status: DC | PRN

## 2012-11-28 MED ORDER — HYDROCODONE-HOMATROPINE 5-1.5 MG/5ML PO SYRP: 5.0000 mL | ORAL_SOLUTION | Freq: Four times a day (QID) | ORAL | Status: DC | PRN

## 2012-11-28 MED ORDER — METHYLPREDNISOLONE ACETATE 80 MG/ML IJ SUSP: 80.0000 mg | Freq: Once | INTRAMUSCULAR | Status: DC

## 2012-11-28 MED ORDER — LEVOFLOXACIN 250 MG PO TABS: 250.0000 mg | ORAL_TABLET | Freq: Every day | ORAL | Status: DC

## 2012-11-28 NOTE — Assessment & Plan Note (Addendum)
I suspect acute gout without crystal analysis, cant r/o RA or other, for depomedrol IM, predpack asd, for uriac acid and rheum lab,  to f/u any worsening symptoms or concerns, consider rheum referral

## 2012-11-28 NOTE — Patient Instructions (Addendum)
You had the steroid shot today Please take all new medication as prescribed - the antibiotic, cough medicine, prednisone, and pain medication as needed Please continue all other medications as before, except dont take the hydrocodone with the oxycodone (which is only for a short time) Please have the pharmacy call with any other refills you may need. Please go to the LAB in the Basement (turn left off the elevator) for the tests to be done tomorrow  Thank you for enrolling in MyChart. Please follow the instructions below to securely access your online medical record. MyChart allows you to send messages to your doctor, view your test results, renew your prescriptions, schedule appointments, and more.

## 2012-11-28 NOTE — Assessment & Plan Note (Signed)
stable overall by history and exam, recent data reviewed with pt, and pt to continue medical treatment as before,  to f/u any worsening symptoms or concerns BP Readings from Last 3 Encounters:  11/16/12 112/80  10/21/12 129/55  08/25/12 130/64    

## 2012-11-28 NOTE — Assessment & Plan Note (Signed)
Mild to mod, for antibx course,  to f/u any worsening symptoms or concerns 

## 2012-11-28 NOTE — Progress Notes (Signed)
Subjective:    Patient ID: Christy May, female    DOB: [birth year], 62 y.o.   MRN: [id number]  HPI  Here with acute onset mild to mod 2-3 days ST, HA, general weakness and malaise, with prod cough greenish sputum, but Pt denies chest pain, increased sob or doe, wheezing, orthopnea, PND, increased LE swelling, palpitations, dizziness or syncope.  Also with incidental 5 days onset bilat hand pain and swelling, actually more the first MCP of the left hand and the left wrist primarily it seems, without high fevers, traumam, or hx of gout.  Pt denies chest pain, increased sob or doe, wheezing, orthopnea, PND, increased LE swelling, palpitations, dizziness or syncope.   Pt denies polydipsia, polyuria.  Past Medical History  Diagnosis Date  . Anemia, unspecified   . Extrinsic asthma, unspecified   . Chronic airway obstruction, not elsewhere classified   . Spondylosis of unspecified site without mention of myelopathy   . Dehydration   . Depressive disorder, not elsewhere classified   . Type II or unspecified type diabetes mellitus without mention of complication, not stated as uncontrolled   . Esophageal reflux   . Unspecified essential hypertension   . Unspecified menopausal and postmenopausal disorder   . Obesity, unspecified   . Inflammatory and toxic neuropathy, unspecified   . Unspecified sleep apnea   . Unspecified vitamin D deficiency   . Dysphagia 2007    historyof dysphagia with severe dysmotility by barium swallow-[name]  . Obesity   . Hypertension   . Diabetes mellitus without complication   . Allergic bronchitis   . Sleep apnea    Past Surgical History  Procedure Laterality Date  . Laser sugery      bilateral eyes  . Back surgery      fusion-multiple cervical spine levels-Dr. Lovell Sheehan  . Excision of abcess      ?? Chest  . Dilation and curettage of uterus    . Exploratory laparotomy    . Partial hysterectomy    . Total knee arthroplasty  2005    Left  . Total  knee arthroplasty  2005    Right- Dr. Turner Daniels  . Foot surgery      Right X 2  . Cholecystectomy    . Appendectomy    . Tonsillectomy    . Abdominal hysterectomy      reports that she quit smoking about 26 years ago. She has never used smokeless tobacco. She reports that she does not drink alcohol or use illicit drugs. family history includes Colon polyps in her brother; Diabetes in her sister and unspecified family member; Esophageal cancer in her brother and sister; Heart disease in her maternal grandfather; and Pancreatic cancer in her sister. Allergies  Allergen Reactions  . Ace Inhibitors Anaphylaxis  . Ace Inhibitors Anaphylaxis  . Penicillins Hives and Rash  . Adhesive (Tape)   . Penicillins     Unknown   . Tape Hives     Current Outpatient Prescriptions on File Prior to Visit  Medication Sig Dispense Refill  . albuterol (PROVENTIL) (2.5 MG/3ML) 0.083% nebulizer solution Take 2.5 mg by nebulization every 6 (six) hours as needed. Respiratory distress      . aspirin 81 MG tablet Take 81 mg by mouth daily.        . Blood Glucose Monitoring Suppl (ONE TOUCH ULTRA MINI) W/DEVICE KIT 1 Device by Does not apply route once.      . cholecalciferol (VITAMIN D) 1000 UNITS tablet Take  2,000 Units by mouth daily.        . colchicine 0.6 MG tablet Take 1 tablet (0.6 mg total) by mouth daily.  90 tablet  1  . cyclobenzaprine (FLEXERIL) 5 MG tablet TAKE ONE TABLET BY MOUTH THREE TIMES DAILY AS NEEDED FOR MUSCLE SPASM  60 tablet  5  . Fluticasone-Salmeterol (ADVAIR DISKUS) 100-50 MCG/DOSE AEPB Inhale 1 puff into the lungs every 12 (twelve) hours.        . gabapentin (NEURONTIN) 600 MG tablet TAKE ONE TABLET BY MOUTH THREE TIMES DAILY  270 tablet  2  . glucose blood test strip 1 each by Other route as needed. Use as instructed to check blood sugar twice daily dx 250.63      . HYDROcodone-acetaminophen (NORCO) 7.5-325 MG per tablet Take 1 tablet by mouth every 6 (six) hours as needed for pain.  60  tablet  1  . insulin NPH (HUMULIN N) 100 UNIT/ML injection Inject 250 Units into the skin every morning.  9 vial  12  . irbesartan (AVAPRO) 300 MG tablet Take 1 tablet (300 mg total) by mouth daily.  90 tablet  3  . Lancets (ONETOUCH ULTRASOFT) lancets 1 each by Other route as needed. Use as instructed to check blood sugar twice daily dx 250.63      . metoprolol (LOPRESSOR) 50 MG tablet TAKE ONE AND ONE-HALF TABLETS BY MOUTH TWICE DAILY  270 tablet  3  . naproxen (NAPROSYN) 500 MG tablet Take 1 tablet (500 mg total) by mouth 2 (two) times daily with a meal.  60 tablet  2  . omeprazole (PRILOSEC) 20 MG capsule Take 1 capsule (20 mg total) by mouth 2 (two) times daily.  180 capsule  3  . oxybutynin (DITROPAN-XL) 10 MG 24 hr tablet Take 1 tablet (10 mg total) by mouth daily.  90 tablet  3  . phentermine 37.5 MG capsule Take 1 capsule (37.5 mg total) by mouth every morning.  30 capsule  2  . potassium chloride (K-DUR,KLOR-CON) 10 MEQ tablet Take 10 mEq by mouth 2 (two) times daily.      . promethazine (PHENERGAN) 25 MG tablet TAKE ONE TABLET BY MOUTH EVERY SIX HOURS AS NEEDED FOR NAUSEA  30 tablet  0   No current facility-administered medications on file prior to visit.   Review of Systems  Constitutional: Negative for unexpected weight change, or unusual diaphoresis  HENT: Negative for tinnitus.   Eyes: Negative for photophobia and visual disturbance.  Respiratory: Negative for choking and stridor.   Gastrointestinal: Negative for vomiting and blood in stool.  Genitourinary: Negative for hematuria and decreased urine volume.  Musculoskeletal: Negative for acute joint swelling Skin: Negative for color change and wound.  Neurological: Negative for tremors and numbness other than noted  Psychiatric/Behavioral: Negative for decreased concentration or  hyperactivity.       Objective:   Physical Exam There were no vitals taken for this visit. VS noted, mil ill Constitutional: Pt appears  well-developed and well-nourished.  HENT: Head: NCAT.  Right Ear: External ear normal.  Left Ear: External ear normal.  Bilat tm's with mild erythema.  Max sinus areas mild tender.  Pharynx with mild erythema, no exudate Eyes: Conjunctivae with bilat erythema, slight d/c, and EOM are normal. Pupils are equal, round, and reactive to light.  Neck: Normal range of motion. Neck supple.  Cardiovascular: Normal rate and regular rhythm.   Pulmonary/Chest: Effort normal and breath sounds mild decreased, no rales, but few bilat  wheezes  Left hand with mod to severe tender/swelling I think primarily of the first and second MCP's with diffuse other swelling of lesser MCP's and finger/thumb swelling, left wrist without warmth/tender Right hand with trace swelling MCP's/fingers, also with right wrist mild tender/swelling but FROM Neurological: Pt is alert. Not confused  Skin: Skin is warm. No erythema.  Psychiatric: Pt behavior is normal. Thought content normal.     Assessment & Plan:

## 2012-11-28 NOTE — Assessment & Plan Note (Signed)
Mild to mod, for steroid tx as above, to f/u any worsening symptoms or concerns 

## 2012-11-29 ENCOUNTER — Other Ambulatory Visit (INDEPENDENT_AMBULATORY_CARE_PROVIDER_SITE_OTHER): Payer: Medicare Other

## 2012-11-29 DIAGNOSIS — M130 Polyarthritis, unspecified: Secondary | ICD-10-CM

## 2012-11-29 DIAGNOSIS — J441 Chronic obstructive pulmonary disease with (acute) exacerbation: Secondary | ICD-10-CM | POA: Diagnosis not present

## 2012-11-29 DIAGNOSIS — M545 Low back pain, unspecified: Secondary | ICD-10-CM | POA: Diagnosis not present

## 2012-11-29 DIAGNOSIS — J069 Acute upper respiratory infection, unspecified: Secondary | ICD-10-CM

## 2012-11-29 LAB — CBC WITH DIFFERENTIAL/PLATELET
Basophils Absolute: 0 10*3/uL (ref 0.0–0.1)
Basophils Relative: 0.3 % (ref 0.0–3.0)
Eosinophils Absolute: 0 10*3/uL (ref 0.0–0.7)
Eosinophils Relative: 0.2 % (ref 0.0–5.0)
HCT: 37.4 % (ref 36.0–46.0)
Hemoglobin: 12.1 g/dL (ref 12.0–15.0)
Lymphocytes Relative: 23.7 % (ref 12.0–46.0)
Lymphs Abs: 2.2 10*3/uL (ref 0.7–4.0)
MCHC: 32.4 g/dL (ref 30.0–36.0)
MCV: 85.7 fl (ref 78.0–100.0)
Monocytes Absolute: 0.6 10*3/uL (ref 0.1–1.0)
Monocytes Relative: 6.4 % (ref 3.0–12.0)
Neutro Abs: 6.3 10*3/uL (ref 1.4–7.7)
Neutrophils Relative %: 69.4 % (ref 43.0–77.0)
Platelets: 207 10*3/uL (ref 150.0–400.0)
RBC: 4.36 Mil/uL (ref 3.87–5.11)
RDW: 17.3 % — ABNORMAL HIGH (ref 11.5–14.6)
WBC: 9.1 10*3/uL (ref 4.5–10.5)

## 2012-11-29 LAB — HEPATIC FUNCTION PANEL
ALT: 26 U/L (ref 0–35)
AST: 20 U/L (ref 0–37)
Alkaline Phosphatase: 83 U/L (ref 39–117)
Total Bilirubin: 0.4 mg/dL (ref 0.3–1.2)

## 2012-11-29 LAB — BASIC METABOLIC PANEL
BUN: 26 mg/dL — ABNORMAL HIGH (ref 6–23)
Chloride: 106 mEq/L (ref 96–112)
Potassium: 5.3 mEq/L — ABNORMAL HIGH (ref 3.5–5.1)

## 2012-11-30 LAB — RHEUMATOID FACTOR: Rhuematoid fact SerPl-aCnc: <10 IU/mL (ref <=14)

## 2012-12-01 DIAGNOSIS — H0019 Chalazion unspecified eye, unspecified eyelid: Secondary | ICD-10-CM | POA: Diagnosis not present

## 2012-12-01 DIAGNOSIS — H01009 Unspecified blepharitis unspecified eye, unspecified eyelid: Secondary | ICD-10-CM | POA: Diagnosis not present

## 2012-12-05 ENCOUNTER — Other Ambulatory Visit: Payer: Self-pay | Admitting: Internal Medicine

## 2012-12-05 NOTE — Telephone Encounter (Signed)
Faxed hardcopy to [facility] [address] [place] 

## 2012-12-06 ENCOUNTER — Other Ambulatory Visit: Payer: Self-pay

## 2012-12-06 MED ORDER — OXYCODONE HCL 5 MG PO TABS: 5.0000 mg | ORAL_TABLET | ORAL | Status: DC | PRN

## 2012-12-06 NOTE — Telephone Encounter (Signed)
Patient informed to pickup hardcopy at the front desk. 

## 2012-12-06 NOTE — Telephone Encounter (Signed)
Done hardcopy to robin  

## 2012-12-13 ENCOUNTER — Telehealth: Payer: Self-pay

## 2012-12-13 NOTE — Telephone Encounter (Signed)
Patient informed. 

## 2012-12-13 NOTE — Telephone Encounter (Signed)
Ok to watch for now, but she may experience some dizziness or dry mouth, which should pass by tomorrow

## 2012-12-13 NOTE — Telephone Encounter (Signed)
The patient started on November 25, 2012 taking her oxybutynin rx every 6 hours. She  thought it was her flexeril bottle.  She informed her pharmacist and was instructed to inform her PCP.  She did stop this am taking this medication incorrectly.  Please advise

## 2012-12-15 DIAGNOSIS — M5106 Intervertebral disc disorders with myelopathy, lumbar region: Secondary | ICD-10-CM | POA: Diagnosis not present

## 2012-12-15 DIAGNOSIS — M19049 Primary osteoarthritis, unspecified hand: Secondary | ICD-10-CM | POA: Diagnosis not present

## 2012-12-15 DIAGNOSIS — M5126 Other intervertebral disc displacement, lumbar region: Secondary | ICD-10-CM | POA: Diagnosis not present

## 2012-12-15 DIAGNOSIS — H0019 Chalazion unspecified eye, unspecified eyelid: Secondary | ICD-10-CM | POA: Diagnosis not present

## 2012-12-26 ENCOUNTER — Other Ambulatory Visit: Payer: Self-pay | Admitting: Internal Medicine

## 2012-12-26 NOTE — Telephone Encounter (Signed)
Done erx 

## 2012-12-27 DIAGNOSIS — H2510 Age-related nuclear cataract, unspecified eye: Secondary | ICD-10-CM | POA: Diagnosis not present

## 2012-12-27 DIAGNOSIS — H0019 Chalazion unspecified eye, unspecified eyelid: Secondary | ICD-10-CM | POA: Diagnosis not present

## 2012-12-27 NOTE — Progress Notes (Signed)
Pt had said yesterday that she was cancelling this case-called dr Elbert Ewings seeing him today-they will take off after the ov

## 2012-12-28 ENCOUNTER — Encounter (HOSPITAL_BASED_OUTPATIENT_CLINIC_OR_DEPARTMENT_OTHER): Admission: RE | Payer: Self-pay | Source: Ambulatory Visit

## 2012-12-28 ENCOUNTER — Ambulatory Visit (HOSPITAL_BASED_OUTPATIENT_CLINIC_OR_DEPARTMENT_OTHER): Admission: RE | Admit: 2012-12-28 | Payer: Medicare Other | Source: Ambulatory Visit | Admitting: Ophthalmology

## 2012-12-28 SURGERY — MINOR EXCISION OF CHALAZION
Anesthesia: LOCAL | Site: Eye | Laterality: Right

## 2012-12-30 ENCOUNTER — Telehealth: Payer: Self-pay

## 2012-12-30 DIAGNOSIS — G8929 Other chronic pain: Secondary | ICD-10-CM

## 2012-12-30 MED ORDER — DICLOFENAC SODIUM 1 % TD GEL: 4.0000 g | Freq: Four times a day (QID) | TRANSDERMAL | Status: DC

## 2012-12-30 NOTE — Telephone Encounter (Signed)
Ok for voltaren gel asd - done erx

## 2012-12-30 NOTE — Telephone Encounter (Signed)
Informed of Md instructions and yes she does want a referral.

## 2012-12-30 NOTE — Telephone Encounter (Signed)
Informed the patient and she informed her ortho has already prescribed the gel and it does not work.

## 2012-12-30 NOTE — Telephone Encounter (Signed)
I would try to continue on the ultram ER for now since this was just prescribed for pain

## 2012-12-30 NOTE — Telephone Encounter (Signed)
The patient would like a refill on her Oxycodone.  Also she has head about a spray for your body for pain?? Is there anything like that?  She also stated she is still having pain in her hand from gout.

## 2012-12-30 NOTE — Telephone Encounter (Signed)
Informed the patient but she was not satisfied with the answer.  States she takes tramadol everyday but is not helping with her hand pain.  She purchased a spray at the pharmacy and tried but did no good.

## 2012-12-30 NOTE — Telephone Encounter (Signed)
Referral done

## 2012-12-30 NOTE — Telephone Encounter (Signed)
I dont have other to offer at this time, I can refer to pain clinic if she wants

## 2013-01-03 DIAGNOSIS — H04229 Epiphora due to insufficient drainage, unspecified lacrimal gland: Secondary | ICD-10-CM | POA: Diagnosis not present

## 2013-01-03 DIAGNOSIS — H04559 Acquired stenosis of unspecified nasolacrimal duct: Secondary | ICD-10-CM | POA: Diagnosis not present

## 2013-01-04 ENCOUNTER — Ambulatory Visit (INDEPENDENT_AMBULATORY_CARE_PROVIDER_SITE_OTHER): Payer: Medicare Other | Admitting: Internal Medicine

## 2013-01-04 ENCOUNTER — Encounter: Payer: Self-pay | Admitting: Internal Medicine

## 2013-01-04 DIAGNOSIS — M109 Gout, unspecified: Secondary | ICD-10-CM | POA: Diagnosis not present

## 2013-01-04 DIAGNOSIS — J449 Chronic obstructive pulmonary disease, unspecified: Secondary | ICD-10-CM | POA: Diagnosis not present

## 2013-01-04 DIAGNOSIS — I10 Essential (primary) hypertension: Secondary | ICD-10-CM

## 2013-01-04 DIAGNOSIS — J209 Acute bronchitis, unspecified: Secondary | ICD-10-CM | POA: Insufficient documentation

## 2013-01-04 MED ORDER — ALLOPURINOL 100 MG PO TABS: 100.0000 mg | ORAL_TABLET | Freq: Every day | ORAL | Status: DC

## 2013-01-04 MED ORDER — CLARITHROMYCIN 500 MG PO TABS: 500.0000 mg | ORAL_TABLET | Freq: Two times a day (BID) | ORAL | Status: DC

## 2013-01-04 MED ORDER — HYDROCODONE-HOMATROPINE 5-1.5 MG/5ML PO SYRP: 5.0000 mL | ORAL_SOLUTION | Freq: Four times a day (QID) | ORAL | Status: DC | PRN

## 2013-01-04 NOTE — Assessment & Plan Note (Signed)
stable overall by history and exam, recent data reviewed with pt, and pt to continue medical treatment as before,  to f/u any worsening symptoms or concerns SpO2 Readings from Last 3 Encounters:  11/16/12 97%  10/21/12 100%  08/25/12 97%

## 2013-01-04 NOTE — Assessment & Plan Note (Signed)
Mild to mod, for antibx course,  to f/u any worsening symptoms or concerns 

## 2013-01-04 NOTE — Assessment & Plan Note (Signed)
stable overall by history and exam, recent data reviewed with pt, and pt to continue medical treatment as before,  to f/u any worsening symptoms or concerns BP Readings from Last 3 Encounters:  11/16/12 112/80  10/21/12 129/55  08/25/12 130/64

## 2013-01-04 NOTE — Patient Instructions (Signed)
Please take all new medication as prescribed- the antibiotic, and the allopurinol to help prevent future gout attacks Please continue all other medications as before, and refills have been done if requested. Please have the pharmacy call with any other refills you may need.  Please keep your appointments with your specialists as you have planned  Please remember to sign up for My Chart if you have not done so, as this will be important to you in the future with finding out test results, communicating by private email, and scheduling acute appointments online when needed.

## 2013-01-04 NOTE — Assessment & Plan Note (Signed)
Ok for allopurinol 100 qd,  to f/u any worsening symptoms or concerns

## 2013-01-04 NOTE — Progress Notes (Signed)
Subjective:    Patient ID: Christy May, female    DOB: 1951-04-04, 62 y.o.   MRN: [id number]  HPI  Here with acute onset mild to mod 2-3 days ST, HA, general weakness and malaise, with prod cough greenish sputum, but Pt denies chest pain, increased sob or doe, wheezing, orthopnea, PND, increased LE swelling, palpitations, dizziness or syncope.  Wheezing resolved, as well as bilat hand joint gouty attack after steroid tx.  Still has recurring hand pain without swelling, ? CTS vs other, asks for gout prevention tx.  Of note also has chronic bilat eye watering due to bilat canalicular blockage, due for optho surgury when improved per Dr Christianson/[facility].  S/p left lumbar ESI per Dr Farris Has with some improved.  Has seen Dr Joline Salt ortho with DJD thumb with offer of cortisone to right thumb but pt declined at the time, as recently had ESI with some glucose increased.  Needs to be off ASA for 4 wks (twice) for the canalicular procedures.  Also incidentally will need bilat cataract realtively soon per pt. Past Medical History  Diagnosis Date  . Anemia, unspecified   . Extrinsic asthma, unspecified   . Chronic airway obstruction, not elsewhere classified   . Spondylosis of unspecified site without mention of myelopathy   . Dehydration   . Depressive disorder, not elsewhere classified   . Type II or unspecified type diabetes mellitus without mention of complication, not stated as uncontrolled   . Esophageal reflux   . Unspecified essential hypertension   . Unspecified menopausal and postmenopausal disorder   . Obesity, unspecified   . Inflammatory and toxic neuropathy, unspecified   . Unspecified sleep apnea   . Unspecified vitamin D deficiency   . Dysphagia 2007    historyof dysphagia with severe dysmotility by barium swallow-[name]  . Obesity   . Hypertension   . Diabetes mellitus without complication   . Allergic bronchitis   . Sleep apnea   . Gout 01/04/2013   Past Surgical  History  Procedure Laterality Date  . Laser sugery      bilateral eyes  . Back surgery      fusion-multiple cervical spine levels-Dr. Lovell Sheehan  . Excision of abcess      ?? Chest  . Dilation and curettage of uterus    . Exploratory laparotomy    . Partial hysterectomy    . Total knee arthroplasty  2005    Left  . Total knee arthroplasty  2005    Right- Dr. Turner Daniels  . Foot surgery      Right X 2  . Cholecystectomy    . Appendectomy    . Tonsillectomy    . Abdominal hysterectomy      reports that she quit smoking about 26 years ago. She has never used smokeless tobacco. She reports that she does not drink alcohol or use illicit drugs. family history includes Colon polyps in her brother; Diabetes in her sister and unspecified family member; Esophageal cancer in her brother and sister; Heart disease in her maternal grandfather; and Pancreatic cancer in her sister. Allergies  Allergen Reactions  . Ace Inhibitors Anaphylaxis  . Ace Inhibitors Anaphylaxis  . Penicillins Hives and Rash  . Adhesive (Tape)   . Penicillins     Unknown   . Tape Hives   Current Outpatient Prescriptions on File Prior to Visit  Medication Sig Dispense Refill  . albuterol (PROVENTIL) (2.5 MG/3ML) 0.083% nebulizer solution Take 2.5 mg by nebulization every 6 (six)  hours as needed. Respiratory distress      . aspirin 81 MG tablet Take 81 mg by mouth daily.        . Blood Glucose Monitoring Suppl (ONE TOUCH ULTRA MINI) W/DEVICE KIT 1 Device by Does not apply route once.      . cholecalciferol (VITAMIN D) 1000 UNITS tablet Take 2,000 Units by mouth daily.        . colchicine 0.6 MG tablet Take 1 tablet (0.6 mg total) by mouth daily.  90 tablet  1  . cyclobenzaprine (FLEXERIL) 5 MG tablet TAKE ONE TABLET BY MOUTH THREE TIMES DAILY AS NEEDED FOR MUSCLE SPASM  60 tablet  5  . diclofenac sodium (VOLTAREN) 1 % GEL Apply 4 g topically 4 (four) times daily. As needed to affected area  100 g  5  . Fluticasone-Salmeterol  (ADVAIR DISKUS) 100-50 MCG/DOSE AEPB Inhale 1 puff into the lungs every 12 (twelve) hours.        . gabapentin (NEURONTIN) 600 MG tablet TAKE 1 TABLET BY MOUTH THREE TIMES DAILY  270 tablet  1  . glucose blood test strip 1 each by Other route as needed. Use as instructed to check blood sugar twice daily dx 250.63      . HYDROcodone-acetaminophen (NORCO) 7.5-325 MG per tablet Take 1 tablet by mouth every 6 (six) hours as needed for pain.  60 tablet  1  . insulin NPH (HUMULIN N) 100 UNIT/ML injection Inject 250 Units into the skin every morning.  9 vial  12  . irbesartan (AVAPRO) 300 MG tablet Take 1 tablet (300 mg total) by mouth daily.  90 tablet  3  . Lancets (ONETOUCH ULTRASOFT) lancets 1 each by Other route as needed. Use as instructed to check blood sugar twice daily dx 250.63      . metoprolol (LOPRESSOR) 50 MG tablet TAKE ONE AND ONE-HALF TABLETS BY MOUTH TWICE DAILY  270 tablet  3  . metoprolol (LOPRESSOR) 50 MG tablet TAKE 1 1/2 TABLETS BY MOUTH TWICE DAILY  270 tablet  3  . naproxen (NAPROSYN) 500 MG tablet Take 1 tablet (500 mg total) by mouth 2 (two) times daily with a meal.  60 tablet  2  . omeprazole (PRILOSEC) 20 MG capsule Take 1 capsule (20 mg total) by mouth 2 (two) times daily.  180 capsule  3  . oxybutynin (DITROPAN-XL) 10 MG 24 hr tablet Take 1 tablet (10 mg total) by mouth daily.  90 tablet  3  . phentermine 37.5 MG capsule Take 1 capsule (37.5 mg total) by mouth every morning.  30 capsule  2  . potassium chloride (K-DUR,KLOR-CON) 10 MEQ tablet Take 10 mEq by mouth 2 (two) times daily.      . promethazine (PHENERGAN) 25 MG tablet TAKE 1 TABLET BY MOUTH EVERY 6 HOURS AS NEEDED FOR NAUSEA  30 tablet  1  . promethazine-codeine (PHENERGAN WITH CODEINE) 6.25-10 MG/5ML syrup TAKE 5 MLS BY MOUTH EVERY 6 HOURS AS NEEDED FOR COUGH  240 mL  0  . traMADol (ULTRAM-ER) 300 MG 24 hr tablet TAKE 1 TABLET BY MOUTH ONCE DAILY  90 tablet  1   Current Facility-Administered Medications on File  Prior to Visit  Medication Dose Route Frequency Provider Last Rate Last Dose  . methylPREDNISolone acetate (DEPO-MEDROL) injection 80 mg  80 mg Intramuscular Once Corwin Levins, MD       Review of Systems  Constitutional: Negative for unexpected weight change, or unusual diaphoresis  HENT: Negative  for tinnitus.   Eyes: Negative for photophobia and visual disturbance.  Respiratory: Negative for choking and stridor.   Gastrointestinal: Negative for vomiting and blood in stool.  Genitourinary: Negative for hematuria and decreased urine volume.  Musculoskeletal: Negative for acute joint swelling Skin: Negative for color change and wound.  Neurological: Negative for tremors and numbness other than noted  Psychiatric/Behavioral: Negative for decreased concentration or  hyperactivity.       Objective:   Physical Exam There were no vitals taken for this visit. VS noted, mild ill Constitutional: Pt appears well-developed and well-nourished. Lavella Lemons HENT: Head: NCAT.  Right Ear: External ear normal.  Left Ear: External ear normal.  Bilat tm's with mild erythema.  Max sinus areas non tender.  Pharynx with mild erythema, no exudate Eyes: Conjunctivae and EOM are normal. Pupils are equal, round, and reactive to light.  Neck: Normal range of motion. Neck supple.  Cardiovascular: Normal rate and regular rhythm.   Pulmonary/Chest: Effort normal and breath sounds decreased, no rales or wheezing  Neurological: Pt is alert. Not confused  Skin: Skin is warm. No erythema.  Psychiatric: Pt behavior is normal. Thought content normal.  Joint: No synovitis    Assessment & Plan:

## 2013-01-11 ENCOUNTER — Telehealth: Payer: Self-pay | Admitting: Endocrinology

## 2013-01-11 NOTE — Telephone Encounter (Signed)
Reduce insulin to 200 units qam Ov next available.

## 2013-01-11 NOTE — Telephone Encounter (Signed)
Patient advised and scheduled appointment °

## 2013-01-11 NOTE — Telephone Encounter (Signed)
Please advise 

## 2013-01-11 NOTE — Telephone Encounter (Signed)
LMOVM to return call.

## 2013-01-12 ENCOUNTER — Other Ambulatory Visit: Payer: Self-pay | Admitting: *Deleted

## 2013-01-12 MED ORDER — OMEPRAZOLE 20 MG PO CPDR: 20.0000 mg | DELAYED_RELEASE_CAPSULE | Freq: Two times a day (BID) | ORAL | Status: DC

## 2013-01-12 NOTE — Telephone Encounter (Signed)
Rx sent to Pharmacy, pt informed.

## 2013-01-13 ENCOUNTER — Telehealth: Payer: Self-pay | Admitting: Internal Medicine

## 2013-01-13 NOTE — Telephone Encounter (Signed)
Pt called to say her pharmacy only gave her 30 omeprazole tablets.  It was written for 180.  She said her insurance will only let her have 30 unless we call the insurance co.  She has the 30 tablets and is aware Dr. Jonny Ruiz is off until Monday.

## 2013-01-16 ENCOUNTER — Encounter: Payer: Self-pay | Admitting: Physical Medicine & Rehabilitation

## 2013-01-16 MED ORDER — OMEPRAZOLE 20 MG PO CPDR: 20.0000 mg | DELAYED_RELEASE_CAPSULE | Freq: Two times a day (BID) | ORAL | Status: DC

## 2013-01-17 ENCOUNTER — Telehealth: Payer: Self-pay | Admitting: Internal Medicine

## 2013-01-17 ENCOUNTER — Encounter: Payer: Self-pay | Admitting: Internal Medicine

## 2013-01-17 ENCOUNTER — Ambulatory Visit (INDEPENDENT_AMBULATORY_CARE_PROVIDER_SITE_OTHER): Payer: Medicare Other | Admitting: Internal Medicine

## 2013-01-17 VITALS — BP 140/84 | HR 83 | Temp 98.1°F | Wt 317.0 lb

## 2013-01-17 DIAGNOSIS — G609 Hereditary and idiopathic neuropathy, unspecified: Secondary | ICD-10-CM

## 2013-01-17 DIAGNOSIS — M79609 Pain in unspecified limb: Secondary | ICD-10-CM

## 2013-01-17 DIAGNOSIS — M79642 Pain in left hand: Secondary | ICD-10-CM

## 2013-01-17 DIAGNOSIS — M79641 Pain in right hand: Secondary | ICD-10-CM

## 2013-01-17 MED ORDER — GABAPENTIN 800 MG PO TABS: 800.0000 mg | ORAL_TABLET | Freq: Three times a day (TID) | ORAL | Status: DC

## 2013-01-17 NOTE — Telephone Encounter (Signed)
Pt called stated that her hand is very hurt and the pain clinic will not be able to see her soon. Pt wants to know what Dr. Jonny Ruiz would like for her to do. Pt got an appt with Baity today at 3:45 pm. Please advise.

## 2013-01-17 NOTE — Telephone Encounter (Signed)
Patient was seen by Rene Kocher NP

## 2013-01-17 NOTE — Telephone Encounter (Signed)
I dont have further to offer at this time

## 2013-01-17 NOTE — Progress Notes (Signed)
Subjective:    Patient ID: Christy May, female    DOB: 11-22-1950, 62 y.o.   MRN: [id number]  HPI  Pt presents to the clinic today with c/o hand pain. This started about 1.5 months ago. She reports the pain as numbness and like there are multiple cuts on her hand that sting. She was seen by Dr. Jonny Ruiz for the same. She was diagnosed with possible gout and treated with a pred pack. Uric acid and RF labs were normal. She was also given tramadol for pain control. Pt called back stating that the pain was not controlled on tramadol and that she would like to have her vicodin refilled. Dr. Jonny Ruiz stated that he would not refill her vicodin but would go ahead and refer her to pain management. She is also using voltaren gel which has not helped.  She has an appointment with them. Her hand is in constant pain.  Review of Systems      Past Medical History  Diagnosis Date  . Anemia, unspecified   . Extrinsic asthma, unspecified   . Chronic airway obstruction, not elsewhere classified   . Spondylosis of unspecified site without mention of myelopathy   . Dehydration   . Depressive disorder, not elsewhere classified   . Type II or unspecified type diabetes mellitus without mention of complication, not stated as uncontrolled   . Esophageal reflux   . Unspecified essential hypertension   . Unspecified menopausal and postmenopausal disorder   . Obesity, unspecified   . Inflammatory and toxic neuropathy, unspecified   . Unspecified sleep apnea   . Unspecified vitamin D deficiency   . Dysphagia 2007    historyof dysphagia with severe dysmotility by barium swallow-[name]  . Obesity   . Hypertension   . Diabetes mellitus without complication   . Allergic bronchitis   . Sleep apnea   . Gout 01/04/2013    Current Outpatient Prescriptions  Medication Sig Dispense Refill  . albuterol (PROVENTIL) (2.5 MG/3ML) 0.083% nebulizer solution Take 2.5 mg by nebulization every 6 (six) hours as needed.  Respiratory distress      . allopurinol (ZYLOPRIM) 100 MG tablet Take 1 tablet (100 mg total) by mouth daily.  90 tablet  3  . aspirin 81 MG tablet Take 81 mg by mouth daily.        . Blood Glucose Monitoring Suppl (ONE TOUCH ULTRA MINI) W/DEVICE KIT 1 Device by Does not apply route once.      . cholecalciferol (VITAMIN D) 1000 UNITS tablet Take 2,000 Units by mouth daily.        . clarithromycin (BIAXIN) 500 MG tablet Take 1 tablet (500 mg total) by mouth 2 (two) times daily.  20 tablet  0  . colchicine 0.6 MG tablet Take 1 tablet (0.6 mg total) by mouth daily.  90 tablet  1  . cyclobenzaprine (FLEXERIL) 5 MG tablet TAKE ONE TABLET BY MOUTH THREE TIMES DAILY AS NEEDED FOR MUSCLE SPASM  60 tablet  5  . diclofenac sodium (VOLTAREN) 1 % GEL Apply 4 g topically 4 (four) times daily. As needed to affected area  100 g  5  . Fluticasone-Salmeterol (ADVAIR DISKUS) 100-50 MCG/DOSE AEPB Inhale 1 puff into the lungs every 12 (twelve) hours.        . gabapentin (NEURONTIN) 600 MG tablet TAKE 1 TABLET BY MOUTH THREE TIMES DAILY  270 tablet  1  . glucose blood test strip 1 each by Other route as needed. Use as instructed  to check blood sugar twice daily dx 250.63      . HYDROcodone-acetaminophen (NORCO) 7.5-325 MG per tablet Take 1 tablet by mouth every 6 (six) hours as needed for pain.  60 tablet  1  . HYDROcodone-homatropine (HYCODAN) 5-1.5 MG/5ML syrup Take 5 mLs by mouth every 6 (six) hours as needed for cough.  240 mL  0  . insulin NPH (HUMULIN N) 100 UNIT/ML injection Inject 250 Units into the skin every morning.  9 vial  12  . irbesartan (AVAPRO) 300 MG tablet Take 1 tablet (300 mg total) by mouth daily.  90 tablet  3  . Lancets (ONETOUCH ULTRASOFT) lancets 1 each by Other route as needed. Use as instructed to check blood sugar twice daily dx 250.63      . metoprolol (LOPRESSOR) 50 MG tablet TAKE ONE AND ONE-HALF TABLETS BY MOUTH TWICE DAILY  270 tablet  3  . metoprolol (LOPRESSOR) 50 MG tablet TAKE 1  1/2 TABLETS BY MOUTH TWICE DAILY  270 tablet  3  . naproxen (NAPROSYN) 500 MG tablet Take 1 tablet (500 mg total) by mouth 2 (two) times daily with a meal.  60 tablet  2  . omeprazole (PRILOSEC) 20 MG capsule Take 1 capsule (20 mg total) by mouth 2 (two) times daily.  180 capsule  3  . oxybutynin (DITROPAN-XL) 10 MG 24 hr tablet Take 1 tablet (10 mg total) by mouth daily.  90 tablet  3  . phentermine 37.5 MG capsule Take 1 capsule (37.5 mg total) by mouth every morning.  30 capsule  2  . potassium chloride (K-DUR,KLOR-CON) 10 MEQ tablet Take 10 mEq by mouth 2 (two) times daily.      . promethazine (PHENERGAN) 25 MG tablet TAKE 1 TABLET BY MOUTH EVERY 6 HOURS AS NEEDED FOR NAUSEA  30 tablet  1  . promethazine-codeine (PHENERGAN WITH CODEINE) 6.25-10 MG/5ML syrup TAKE 5 MLS BY MOUTH EVERY 6 HOURS AS NEEDED FOR COUGH  240 mL  0  . traMADol (ULTRAM-ER) 300 MG 24 hr tablet TAKE 1 TABLET BY MOUTH ONCE DAILY  90 tablet  1   Current Facility-Administered Medications  Medication Dose Route Frequency Provider Last Rate Last Dose  . methylPREDNISolone acetate (DEPO-MEDROL) injection 80 mg  80 mg Intramuscular Once Corwin Levins, MD        Allergies  Allergen Reactions  . Ace Inhibitors Anaphylaxis  . Ace Inhibitors Anaphylaxis  . Penicillins Hives and Rash  . Adhesive (Tape)   . Penicillins     Unknown   . Tape Hives    Family History  Problem Relation Age of Onset  . Esophageal cancer Brother   . Esophageal cancer Sister     ?  . Colon polyps Brother   . Pancreatic cancer Sister     ?  . Diabetes Sister   . Diabetes      Aunt and Uncle  . Heart disease Maternal Grandfather     History   Social History  . Marital Status: Widowed    Spouse Name: N/A    Number of Children: N/A  . Years of Education: N/A   Occupational History  . [employer] (former)    Social History Main Topics  . Smoking status: Former Smoker    Quit date: 06/22/1986  . Smokeless tobacco: Never Used  .  Alcohol Use: No  . Drug Use: No  . Sexually Active: Not on file   Other Topics Concern  . Not on file  Social History Narrative   ** Merged History Encounter **       Patient does not get regular exercise.      Widowed 2004      Disabled     Constitutional: Denies fever, malaise, fatigue, headache or abrupt weight changes.  Musculoskeletal: Pt reports bilateral hand pain. Denies decrease in range of motion, difficulty with gait, muscle pain or joint pain and swelling.  Skin: Denies redness, rashes, lesions or ulcercations.  Neurological: Denies dizziness, difficulty with memory, difficulty with speech or problems with balance and coordination.   No other specific complaints in a complete review of systems (except as listed in HPI above).  Objective:   Physical Exam  BP 140/84  Pulse 83  Temp(Src) 98.1 F (36.7 C) (Oral)  Wt 317 lb (143.79 kg)  BMI 54.39 kg/m2  SpO2 94% Wt Readings from Last 3 Encounters:  01/17/13 317 lb (143.79 kg)  11/16/12 316 lb 4 oz (143.45 kg)  08/25/12 307 lb 8 oz (139.481 kg)    General: Appears her stated age, well developed, well nourished in NAD. Cardiovascular: Normal rate and rhythm. S1,S2 noted.  No murmur, rubs or gallops noted. No JVD or BLE edema. No carotid bruits noted. Pulmonary/Chest: Normal effort and positive vesicular breath sounds. No respiratory distress. No wheezes, rales or ronchi noted. . Musculoskeletal: Normal range of motion. No signs of joint swelling. No difficulty with gait.  Neurological: Alert and oriented. Cranial nerves II-XII intact. Coordination normal. +DTRs bilaterally.   BMET    Component Value Date/Time   NA 138 11/29/2012 1211   K 5.3* 11/29/2012 1211   CL 106 11/29/2012 1211   CO2 24 11/29/2012 1211   GLUCOSE 263* 11/29/2012 1211   BUN 26* 11/29/2012 1211   CREATININE 1.5* 11/29/2012 1211   CALCIUM 9.8 11/29/2012 1211   GFRNONAA 60* 10/21/2012 1740   GFRAA 69* 10/21/2012 1740    Lipid Panel      Component Value Date/Time   CHOL 180 11/21/2010 1520   TRIG 108.0 11/21/2010 1520   HDL 50.10 11/21/2010 1520   CHOLHDL 4 11/21/2010 1520   VLDL 21.6 11/21/2010 1520   LDLCALC 108* 11/21/2010 1520    CBC    Component Value Date/Time   WBC 9.1 11/29/2012 1211   RBC 4.36 11/29/2012 1211   HGB 12.1 11/29/2012 1211   HCT 37.4 11/29/2012 1211   PLT 207.0 11/29/2012 1211   MCV 85.7 11/29/2012 1211   MCH 27.8 10/21/2012 1740   MCHC 32.4 11/29/2012 1211   RDW 17.3* 11/29/2012 1211   LYMPHSABS 2.2 11/29/2012 1211   MONOABS 0.6 11/29/2012 1211   EOSABS 0.0 11/29/2012 1211   BASOSABS 0.0 11/29/2012 1211    Hgb A1C Lab Results  Component Value Date   HGBA1C 8.1* 07/13/2012         Assessment & Plan:   Neuropathic pain, bilateral hands:  Increase neurontin to 800 mg TID Keep your appointment with pain specialist Continue tramadol  RTC as needed or if symptoms persist or worsen

## 2013-01-17 NOTE — Patient Instructions (Addendum)

## 2013-01-23 ENCOUNTER — Telehealth: Payer: Self-pay

## 2013-01-23 NOTE — Telephone Encounter (Signed)
PA for Omeprazole 20 mg BID has been approved from 12-24-12 through 01-23-14.  Case [id number].  Pharmacy will be informed once received letter from [facility] stating approval.

## 2013-01-27 ENCOUNTER — Telehealth: Payer: Self-pay

## 2013-01-27 ENCOUNTER — Other Ambulatory Visit: Payer: Self-pay | Admitting: Internal Medicine

## 2013-01-27 MED ORDER — PROMETHAZINE-CODEINE 6.25-10 MG/5ML PO SYRP: ORAL_SOLUTION | ORAL | Status: DC

## 2013-01-27 NOTE — Telephone Encounter (Signed)
The patient still has some congestion and cough.  She is scheduled to have surgery on the 29th of August and would like to get well.  Please advise on OTC or rx.  The last cough rx sent in was too expensive, but the phenergan with codeine was much more affordable.

## 2013-01-27 NOTE — Telephone Encounter (Signed)
Done erx 

## 2013-01-27 NOTE — Telephone Encounter (Signed)
Patient informed and faxed to [facility]

## 2013-01-27 NOTE — Telephone Encounter (Signed)
Done hardcopy to [facility]bin - phenergan with cod

## 2013-02-04 ENCOUNTER — Other Ambulatory Visit: Payer: Self-pay | Admitting: Internal Medicine

## 2013-02-06 ENCOUNTER — Ambulatory Visit (INDEPENDENT_AMBULATORY_CARE_PROVIDER_SITE_OTHER): Payer: Medicare Other | Admitting: Endocrinology

## 2013-02-06 ENCOUNTER — Encounter: Payer: Self-pay | Admitting: Endocrinology

## 2013-02-06 VITALS — BP 130/80 | HR 78 | Ht 64.0 in | Wt 319.0 lb

## 2013-02-06 DIAGNOSIS — E1049 Type 1 diabetes mellitus with other diabetic neurological complication: Secondary | ICD-10-CM

## 2013-02-06 LAB — MICROALBUMIN / CREATININE URINE RATIO: Microalb, Ur: 3.2 mg/dL — ABNORMAL HIGH (ref 0.0–1.9)

## 2013-02-06 LAB — HEMOGLOBIN A1C: Hgb A1c MFr Bld: 8.6 % — ABNORMAL HIGH (ref 4.6–6.5)

## 2013-02-06 NOTE — Telephone Encounter (Signed)
Done hardcopy to robin  

## 2013-02-06 NOTE — Progress Notes (Signed)
Subjective:    Patient ID: Christy May, female    DOB: [birth year], 62 y.o.   MRN: [id number]  HPI Pt returns for f/u of DM (dx'ed approx 1985; he has mild if any neuropathy of the lower extremities; no associated complications; it is characterized by insulin resistance; she has done better with a simpler qd insulin regimen).  She reduced her NPH insulin to 200 units qam, due to hypoglycemia, approx 2 weeks ago.  she brings a record of her cbg's which i have reviewed today.  Since the insulin was reduced, cbg's vary from 149-200.  There is no trend throughout the day.   Past Medical History  Diagnosis Date  . Anemia, unspecified   . Extrinsic asthma, unspecified   . Chronic airway obstruction, not elsewhere classified   . Spondylosis of unspecified site without mention of myelopathy   . Dehydration   . Depressive disorder, not elsewhere classified   . Type II or unspecified type diabetes mellitus without mention of complication, not stated as uncontrolled   . Esophageal reflux   . Unspecified essential hypertension   . Unspecified menopausal and postmenopausal disorder   . Obesity, unspecified   . Inflammatory and toxic neuropathy, unspecified   . Unspecified sleep apnea   . Unspecified vitamin D deficiency   . Dysphagia 2007    historyof dysphagia with severe dysmotility by barium swallow-[name]  . Obesity   . Hypertension   . Diabetes mellitus without complication   . Allergic bronchitis   . Sleep apnea   . Gout 01/04/2013    Past Surgical History  Procedure Laterality Date  . Laser sugery      bilateral eyes  . Back surgery      fusion-multiple cervical spine levels-Dr. Lovell Sheehan  . Excision of abcess      ?? Chest  . Dilation and curettage of uterus    . Exploratory laparotomy    . Partial hysterectomy    . Total knee arthroplasty  2005    Left  . Total knee arthroplasty  2005    Right- Dr. Turner Daniels  . Foot surgery      Right X 2  . Cholecystectomy    .  Appendectomy    . Tonsillectomy    . Abdominal hysterectomy      History   Social History  . Marital Status: Widowed    Spouse Name: N/A    Number of Children: N/A  . Years of Education: N/A   Occupational History  . [employer] (former)    Social History Main Topics  . Smoking status: Former Smoker    Quit date: 06/22/1986  . Smokeless tobacco: Never Used  . Alcohol Use: No  . Drug Use: No  . Sexual Activity: Not on file   Other Topics Concern  . Not on file   Social History Narrative   ** Merged History Encounter **       Patient does not get regular exercise.      Widowed 2004      Disabled    Current Outpatient Prescriptions on File Prior to Visit  Medication Sig Dispense Refill  . albuterol (PROVENTIL) (2.5 MG/3ML) 0.083% nebulizer solution Take 2.5 mg by nebulization every 6 (six) hours as needed. Respiratory distress      . allopurinol (ZYLOPRIM) 100 MG tablet Take 1 tablet (100 mg total) by mouth daily.  90 tablet  3  . aspirin 81 MG tablet Take 81 mg by mouth daily.        Marland Kitchen  Blood Glucose Monitoring Suppl (ONE TOUCH ULTRA MINI) W/DEVICE KIT 1 Device by Does not apply route once.      . cholecalciferol (VITAMIN D) 1000 UNITS tablet Take 2,000 Units by mouth daily.        . colchicine 0.6 MG tablet Take 1 tablet (0.6 mg total) by mouth daily.  90 tablet  1  . cyclobenzaprine (FLEXERIL) 5 MG tablet TAKE ONE TABLET BY MOUTH THREE TIMES DAILY AS NEEDED FOR MUSCLE SPASM  60 tablet  5  . diclofenac sodium (VOLTAREN) 1 % GEL Apply 4 g topically 4 (four) times daily. As needed to affected area  100 g  5  . Fluticasone-Salmeterol (ADVAIR DISKUS) 100-50 MCG/DOSE AEPB Inhale 1 puff into the lungs every 12 (twelve) hours.        . gabapentin (NEURONTIN) 800 MG tablet Take 1 tablet (800 mg total) by mouth 3 (three) times daily.  270 tablet  3  . HYDROcodone-acetaminophen (NORCO) 7.5-325 MG per tablet TAKE 1 TABLET BY MOUTH EVERY 6 HOURS AS NEEDED FOR PAIN  60 tablet   2  . irbesartan (AVAPRO) 300 MG tablet Take 1 tablet (300 mg total) by mouth daily.  90 tablet  3  . Lancets (ONETOUCH ULTRASOFT) lancets 1 each by Other route as needed. Use as instructed to check blood sugar twice daily dx 250.63      . metoprolol (LOPRESSOR) 50 MG tablet TAKE ONE AND ONE-HALF TABLETS BY MOUTH TWICE DAILY  270 tablet  3  . metoprolol (LOPRESSOR) 50 MG tablet TAKE 1 1/2 TABLETS BY MOUTH TWICE DAILY  270 tablet  3  . naproxen (NAPROSYN) 500 MG tablet Take 1 tablet (500 mg total) by mouth 2 (two) times daily with a meal.  60 tablet  2  . omeprazole (PRILOSEC) 20 MG capsule Take 1 capsule (20 mg total) by mouth 2 (two) times daily.  180 capsule  3  . oxybutynin (DITROPAN-XL) 10 MG 24 hr tablet Take 1 tablet (10 mg total) by mouth daily.  90 tablet  3  . phentermine 37.5 MG capsule TAKE 1 CAPSULE BY MOUTH ONCE DAILY IN THE MORNING  30 capsule  0  . potassium chloride (K-DUR,KLOR-CON) 10 MEQ tablet Take 10 mEq by mouth 2 (two) times daily.      . promethazine (PHENERGAN) 25 MG tablet TAKE 1 TABLET BY MOUTH EVERY 6 HOURS AS NEEDED FOR NAUSEA  30 tablet  2  . promethazine-codeine (PHENERGAN WITH CODEINE) 6.25-10 MG/5ML syrup TAKE 5 MLS BY MOUTH EVERY 6 HOURS AS NEEDED FOR COUGH  240 mL  0  . traMADol (ULTRAM-ER) 300 MG 24 hr tablet TAKE 1 TABLET BY MOUTH ONCE DAILY  90 tablet  1   Current Facility-Administered Medications on File Prior to Visit  Medication Dose Route Frequency Provider Last Rate Last Dose  . methylPREDNISolone acetate (DEPO-MEDROL) injection 80 mg  80 mg Intramuscular Once Corwin Levins, MD        Allergies  Allergen Reactions  . Ace Inhibitors Anaphylaxis  . Ace Inhibitors Anaphylaxis  . Penicillins Hives and Rash  . Adhesive [Tape]   . Penicillins     Unknown   . Tape Hives    Family History  Problem Relation Age of Onset  . Esophageal cancer Brother   . Esophageal cancer Sister     ?  . Colon polyps Brother   . Pancreatic cancer Sister     ?  .  Diabetes Sister   . Diabetes  Aunt and Uncle  . Heart disease Maternal Grandfather    BP 130/80  Pulse 78  Ht 5\' 4"  (1.626 m)  Wt 319 lb (144.697 kg)  BMI 54.73 kg/m2  SpO2 98%  Review of Systems Denies LOC and weight change    Objective:   Physical Exam VITAL SIGNS:  See vs page GENERAL: no distress.    Lab Results  Component Value Date   HGBA1C 8.6* 02/06/2013      Assessment & Plan:  DM: This insulin regimen was chosen from multiple options, for its simplicity.  The benefits of glycemic control must be weighed against the risks of hypoglycemia.  She needs increased rx. Morbid obesity.  This limits exercise rx of DM.

## 2013-02-06 NOTE — Telephone Encounter (Signed)
Faxed hardcopy to [facility] [address] [place] 

## 2013-02-08 NOTE — Patient Instructions (Signed)
blood tests are being requested for you today.  You will be contacted with results. Please come back for a follow-up appointment in 3 months. check your blood sugar twice a day.  vary the time of day when you check, between before the 3 meals, and at bedtime.  also check if you have symptoms of your blood sugar being too high or too low.  please keep a record of the readings and bring it to your next appointment here.  please call us sooner if your blood sugar goes below 70, or if you have a lot of readings over 200. 

## 2013-02-14 ENCOUNTER — Ambulatory Visit (INDEPENDENT_AMBULATORY_CARE_PROVIDER_SITE_OTHER): Payer: Medicare Other | Admitting: Internal Medicine

## 2013-02-14 ENCOUNTER — Encounter: Payer: Medicare Other | Admitting: Physical Medicine & Rehabilitation

## 2013-02-14 ENCOUNTER — Encounter: Payer: Self-pay | Admitting: Internal Medicine

## 2013-02-14 VITALS — BP 120/70 | HR 90 | Temp 97.6°F | Ht 64.0 in | Wt 320.4 lb

## 2013-02-14 DIAGNOSIS — I10 Essential (primary) hypertension: Secondary | ICD-10-CM | POA: Diagnosis not present

## 2013-02-14 DIAGNOSIS — G5600 Carpal tunnel syndrome, unspecified upper limb: Secondary | ICD-10-CM | POA: Diagnosis not present

## 2013-02-14 DIAGNOSIS — J449 Chronic obstructive pulmonary disease, unspecified: Secondary | ICD-10-CM | POA: Diagnosis not present

## 2013-02-14 DIAGNOSIS — G5603 Carpal tunnel syndrome, bilateral upper limbs: Secondary | ICD-10-CM | POA: Insufficient documentation

## 2013-02-14 MED ORDER — CYCLOBENZAPRINE HCL 5 MG PO TABS: ORAL_TABLET | ORAL | Status: DC

## 2013-02-14 NOTE — Assessment & Plan Note (Signed)
stable overall by history and exam, recent data reviewed with pt, and pt to continue medical treatment as before,  to f/u any worsening symptoms or concerns BP Readings from Last 3 Encounters:  02/14/13 120/70  02/06/13 130/80  01/17/13 140/84

## 2013-02-14 NOTE — Assessment & Plan Note (Addendum)
stable overall by history and exam, recent data reviewed with pt, and pt to continue medical treatment as before,  to f/u any worsening symptoms or concerns SpO2 Readings from Last 3 Encounters:  02/14/13 92%  02/06/13 98%  01/17/13 94%    OK for opthalmic surgury as planned  Note:  Total time for pt hx, exam, review of record with pt in the room, determination of diagnoses and plan for further eval and tx is > 40 min, with over 50% spent in coordination and counseling of patient

## 2013-02-14 NOTE — Patient Instructions (Addendum)
OK to stop the allopurinol and the colchicine  Your flexeril was refilled today  You are OK for eye surgury later this week, and a note will be faxed   You will be contacted regarding the referral for: Dr Orlan Leavens for possible carpal tunnel to both hands  Please continue all other medications as before  Please have the pharmacy call with any other refills you may need.  Please keep your appointments with your specialists as you have planned

## 2013-02-14 NOTE — Assessment & Plan Note (Signed)
For referral back to dr Joline Salt surgury

## 2013-02-14 NOTE — Progress Notes (Addendum)
Subjective:    Patient ID: Christy May, female    DOB: Feb 01, 1951, 62 y.o.   MRN: [id number]  HPI  Here to f/u.,   For bilat lower lid canalicular surgury per optho possibly later this wk.  Seeing endo, had been having some low sugars, recent change of insulin from 250 to 200 (sugars too high then), now back to 225. Needs preop clearnace. Pt denies chest pain, increased sob or doe, wheezing, orthopnea, PND, increased LE swelling, palpitations, dizziness or syncope.  Pt denies new neurological symptoms such as new headache, or facial or extremity weakness  ? DM neuropathic pain to LUE but has worsening pain/numbness to both hands, no weakness. stopped gout medicine.   Needs flexeril refill; was late recently to pain clinic appt, not able to be seen, re-scheduled.   Past Medical History  Diagnosis Date  . Anemia, unspecified   . Extrinsic asthma, unspecified   . Chronic airway obstruction, not elsewhere classified   . Spondylosis of unspecified site without mention of myelopathy   . Dehydration   . Depressive disorder, not elsewhere classified   . Type II or unspecified type diabetes mellitus without mention of complication, not stated as uncontrolled   . Esophageal reflux   . Unspecified essential hypertension   . Unspecified menopausal and postmenopausal disorder   . Obesity, unspecified   . Inflammatory and toxic neuropathy, unspecified   . Unspecified sleep apnea   . Unspecified vitamin D deficiency   . Dysphagia 2007    historyof dysphagia with severe dysmotility by barium swallow-[name]  . Obesity   . Hypertension   . Diabetes mellitus without complication   . Allergic bronchitis   . Sleep apnea   . Gout 01/04/2013   Past Surgical History  Procedure Laterality Date  . Laser sugery      bilateral eyes  . Back surgery      fusion-multiple cervical spine levels-Dr. Lovell Sheehan  . Excision of abcess      ?? Chest  . Dilation and curettage of uterus    . Exploratory  laparotomy    . Partial hysterectomy    . Total knee arthroplasty  2005    Left  . Total knee arthroplasty  2005    Right- Dr. Turner Daniels  . Foot surgery      Right X 2  . Cholecystectomy    . Appendectomy    . Tonsillectomy    . Abdominal hysterectomy      reports that she quit smoking about 26 years ago. She has never used smokeless tobacco. She reports that she does not drink alcohol or use illicit drugs. family history includes Colon polyps in her brother; Diabetes in her sister and another family member; Esophageal cancer in her brother and sister; Heart disease in her maternal grandfather; Pancreatic cancer in her sister. Allergies  Allergen Reactions  . Ace Inhibitors Anaphylaxis  . Ace Inhibitors Anaphylaxis  . Penicillins Hives and Rash  . Adhesive [Tape]   . Penicillins     Unknown   . Tape Hives   Current Outpatient Prescriptions on File Prior to Visit  Medication Sig Dispense Refill  . albuterol (PROVENTIL) (2.5 MG/3ML) 0.083% nebulizer solution Take 2.5 mg by nebulization every 6 (six) hours as needed. Respiratory distress      . aspirin 81 MG tablet Take 81 mg by mouth daily.        . Blood Glucose Monitoring Suppl (ONE TOUCH ULTRA MINI) W/DEVICE KIT 1 Device by Does  not apply route once.      . cholecalciferol (VITAMIN D) 1000 UNITS tablet Take 2,000 Units by mouth daily.        . diclofenac sodium (VOLTAREN) 1 % GEL Apply 4 g topically 4 (four) times daily. As needed to affected area  100 g  5  . Fluticasone-Salmeterol (ADVAIR DISKUS) 100-50 MCG/DOSE AEPB Inhale 1 puff into the lungs every 12 (twelve) hours.        . gabapentin (NEURONTIN) 800 MG tablet Take 1 tablet (800 mg total) by mouth 3 (three) times daily.  270 tablet  3  . HYDROcodone-acetaminophen (NORCO) 7.5-325 MG per tablet TAKE 1 TABLET BY MOUTH EVERY 6 HOURS AS NEEDED FOR PAIN  60 tablet  2  . insulin NPH (HUMULIN N,NOVOLIN N) 100 UNIT/ML injection Inject 225 Units into the skin every morning.       .  irbesartan (AVAPRO) 300 MG tablet Take 1 tablet (300 mg total) by mouth daily.  90 tablet  3  . Lancets (ONETOUCH ULTRASOFT) lancets 1 each by Other route as needed. Use as instructed to check blood sugar twice daily dx 250.63      . metoprolol (LOPRESSOR) 50 MG tablet TAKE ONE AND ONE-HALF TABLETS BY MOUTH TWICE DAILY  270 tablet  3  . metoprolol (LOPRESSOR) 50 MG tablet TAKE 1 1/2 TABLETS BY MOUTH TWICE DAILY  270 tablet  3  . naproxen (NAPROSYN) 500 MG tablet Take 1 tablet (500 mg total) by mouth 2 (two) times daily with a meal.  60 tablet  2  . omeprazole (PRILOSEC) 20 MG capsule Take 1 capsule (20 mg total) by mouth 2 (two) times daily.  180 capsule  3  . oxybutynin (DITROPAN-XL) 10 MG 24 hr tablet Take 1 tablet (10 mg total) by mouth daily.  90 tablet  3  . phentermine 37.5 MG capsule TAKE 1 CAPSULE BY MOUTH ONCE DAILY IN THE MORNING  30 capsule  0  . potassium chloride (K-DUR,KLOR-CON) 10 MEQ tablet Take 10 mEq by mouth 2 (two) times daily.      . promethazine (PHENERGAN) 25 MG tablet TAKE 1 TABLET BY MOUTH EVERY 6 HOURS AS NEEDED FOR NAUSEA  30 tablet  2  . promethazine-codeine (PHENERGAN WITH CODEINE) 6.25-10 MG/5ML syrup TAKE 5 MLS BY MOUTH EVERY 6 HOURS AS NEEDED FOR COUGH  240 mL  0  . traMADol (ULTRAM-ER) 300 MG 24 hr tablet TAKE 1 TABLET BY MOUTH ONCE DAILY  90 tablet  1  . allopurinol (ZYLOPRIM) 100 MG tablet Take 1 tablet (100 mg total) by mouth daily.  90 tablet  3   Current Facility-Administered Medications on File Prior to Visit  Medication Dose Route Frequency Provider Last Rate Last Dose  . methylPREDNISolone acetate (DEPO-MEDROL) injection 80 mg  80 mg Intramuscular Once Corwin Levins, MD        Review of Systems  Constitutional: Negative for unexpected weight change, or unusual diaphoresis  HENT: Negative for tinnitus.   Eyes: Negative for photophobia and visual disturbance.  Respiratory: Negative for choking and stridor.   Gastrointestinal: Negative for vomiting and  blood in stool.  Genitourinary: Negative for hematuria and decreased urine volume.  Musculoskeletal: Negative for acute joint swelling Skin: Negative for color change and wound.  Neurological: Negative for tremors and numbness other than noted  Psychiatric/Behavioral: Negative for decreased concentration or  hyperactivity.       Objective:   Physical Exam BP 120/70  Pulse 90  Temp(Src) 97.6 F (  36.4 C) (Oral)  Ht 5\' 4"  (1.626 m)  Wt 320 lb 6 oz (145.321 kg)  BMI 54.97 kg/m2  SpO2 92% VS noted,  Constitutional: Pt appears well-developed and well-nourished.  HENT: Head: NCAT.  Right Ear: External ear normal.  Left Ear: External ear normal.  Eyes: Conjunctivae and EOM are normal. Pupils are equal, round, and reactive to light.  Neck: Normal range of motion. Neck supple.  Cardiovascular: Normal rate and regular rhythm.   Pulmonary/Chest: Effort normal and breath sounds decreased, no wheezes.  Abd:  Soft, NT, non-distended, + BS Neurological: Pt is alert. Not confused  Skin: Skin is warm. No erythema.  Psychiatric: Pt behavior is normal. Thought content normal. 1+ nervous    Assessment & Plan:  Quality Measures addressed:  Diabetes LDL < 100: pt declines further medication Diabetes Hgba1c < 8%: pt declines further medication

## 2013-02-23 DIAGNOSIS — E119 Type 2 diabetes mellitus without complications: Secondary | ICD-10-CM | POA: Diagnosis not present

## 2013-02-23 DIAGNOSIS — R9431 Abnormal electrocardiogram [ECG] [EKG]: Secondary | ICD-10-CM | POA: Diagnosis not present

## 2013-02-23 DIAGNOSIS — Z01818 Encounter for other preprocedural examination: Secondary | ICD-10-CM | POA: Diagnosis not present

## 2013-02-23 DIAGNOSIS — G4730 Sleep apnea, unspecified: Secondary | ICD-10-CM | POA: Diagnosis not present

## 2013-02-23 DIAGNOSIS — H04559 Acquired stenosis of unspecified nasolacrimal duct: Secondary | ICD-10-CM | POA: Diagnosis not present

## 2013-02-23 DIAGNOSIS — J45909 Unspecified asthma, uncomplicated: Secondary | ICD-10-CM | POA: Diagnosis not present

## 2013-02-23 DIAGNOSIS — I10 Essential (primary) hypertension: Secondary | ICD-10-CM | POA: Diagnosis not present

## 2013-02-23 DIAGNOSIS — M5137 Other intervertebral disc degeneration, lumbosacral region: Secondary | ICD-10-CM | POA: Diagnosis not present

## 2013-02-23 DIAGNOSIS — K219 Gastro-esophageal reflux disease without esophagitis: Secondary | ICD-10-CM | POA: Diagnosis not present

## 2013-02-24 DIAGNOSIS — G5600 Carpal tunnel syndrome, unspecified upper limb: Secondary | ICD-10-CM | POA: Diagnosis not present

## 2013-02-28 ENCOUNTER — Ambulatory Visit: Payer: Medicare Other | Admitting: Internal Medicine

## 2013-02-28 DIAGNOSIS — Z0289 Encounter for other administrative examinations: Secondary | ICD-10-CM

## 2013-03-03 ENCOUNTER — Encounter: Payer: Medicare Other | Admitting: Physical Medicine & Rehabilitation

## 2013-03-13 ENCOUNTER — Other Ambulatory Visit: Payer: Self-pay | Admitting: Internal Medicine

## 2013-03-13 NOTE — Telephone Encounter (Signed)
Please let pt know, as I believe was discussed, this medication is not meant to be long term.  We should not continue the medication as it is not clear about the long term side effects and is not approved by the FDA for this

## 2013-03-13 NOTE — Telephone Encounter (Signed)
Ok to refill? Last OV 8.26.14 Last filled 8.8.14

## 2013-03-14 NOTE — Telephone Encounter (Signed)
Called the patient left message to call back 

## 2013-03-14 NOTE — Telephone Encounter (Signed)
Patient informed of MD instructions on medication.  The patient would like to know of any alternatives to weight loss you could offer and when should she come in for her next appointment??

## 2013-03-15 NOTE — Telephone Encounter (Signed)
Unfortunately I have no other reasonably priced medication that works. We would like to see back in 3 mo

## 2013-03-15 NOTE — Telephone Encounter (Signed)
Patient informed. 

## 2013-03-25 DIAGNOSIS — G5600 Carpal tunnel syndrome, unspecified upper limb: Secondary | ICD-10-CM | POA: Diagnosis not present

## 2013-04-05 ENCOUNTER — Telehealth: Payer: Self-pay | Admitting: *Deleted

## 2013-04-05 MED ORDER — HYDROCODONE-ACETAMINOPHEN 7.5-325 MG PO TABS: ORAL_TABLET | ORAL | Status: DC

## 2013-04-05 NOTE — Telephone Encounter (Signed)
Called the patient informed to pickup hardcopy at the front desk. 

## 2013-04-05 NOTE — Telephone Encounter (Signed)
Pt called requesting refill on Hydrocodone.  Pt had one left on Rx written on 8.16.14 however needs hard copy now.  Please advise

## 2013-04-05 NOTE — Telephone Encounter (Signed)
Done hardcopy to robin  

## 2013-04-07 DIAGNOSIS — G5600 Carpal tunnel syndrome, unspecified upper limb: Secondary | ICD-10-CM | POA: Diagnosis not present

## 2013-04-13 ENCOUNTER — Telehealth: Payer: Self-pay | Admitting: Internal Medicine

## 2013-04-13 NOTE — Telephone Encounter (Signed)
Pt needs an RX for diabetic shoes to be faxed to Dr. Beverlee Nims office at [phone]. Also she is having her second eye surgery at [facility] on Oct 31.  The surgeon is also going to remove a tooth from her nostril at that surgery.

## 2013-04-17 ENCOUNTER — Ambulatory Visit (INDEPENDENT_AMBULATORY_CARE_PROVIDER_SITE_OTHER): Payer: Medicare Other | Admitting: Podiatry

## 2013-04-17 ENCOUNTER — Encounter: Payer: Self-pay | Admitting: Podiatry

## 2013-04-17 VITALS — BP 112/68 | HR 80 | Resp 12 | Ht 64.0 in | Wt 323.0 lb

## 2013-04-17 DIAGNOSIS — M79609 Pain in unspecified limb: Secondary | ICD-10-CM | POA: Diagnosis not present

## 2013-04-17 DIAGNOSIS — E1159 Type 2 diabetes mellitus with other circulatory complications: Secondary | ICD-10-CM

## 2013-04-17 DIAGNOSIS — B351 Tinea unguium: Secondary | ICD-10-CM

## 2013-04-17 DIAGNOSIS — Q828 Other specified congenital malformations of skin: Secondary | ICD-10-CM

## 2013-04-18 NOTE — Telephone Encounter (Signed)
Done hardcopy to robin  

## 2013-04-18 NOTE — Progress Notes (Signed)
Subjective:     Patient ID: Christy May, female   DOB: [birth year], 62 y.o.   MRN: [id number]  HPI patient isn't at risk for health female with nail disease 1-5 bilateral that are painful and difficult to wear shoe gear with. And is also noted to have lesion sub-5 right that is painful with multiple risk factors  Review of Systems     Objective:   Physical Exam  Nursing note and vitals reviewed. Constitutional: She is oriented to person, place, and time.  Musculoskeletal: Normal range of motion.  Neurological: She is alert and oriented to person, place, and time.  Skin: Skin is warm.   Diminishment of circulatory status PT and DP pulses was noted and I noted there to be nail disease 1-5 bilateral with thick painful type that. Asians sub-5 right painful when pressed with multiple at risk factor    Assessment:     Mycotic nail infection with pain 1-5 bilateral and porokeratotic type lesions sub-5 right painful    Plan:     Debridement of nailbeds 1-5 bilateral with no iatrogenic bleeding and debridement of lesion sub-5 right with no iatrogenic bleeding noted

## 2013-04-19 NOTE — Telephone Encounter (Signed)
Faxed hard copy as requested.

## 2013-04-21 DIAGNOSIS — H04559 Acquired stenosis of unspecified nasolacrimal duct: Secondary | ICD-10-CM | POA: Diagnosis not present

## 2013-04-21 DIAGNOSIS — I10 Essential (primary) hypertension: Secondary | ICD-10-CM | POA: Diagnosis not present

## 2013-04-21 DIAGNOSIS — J449 Chronic obstructive pulmonary disease, unspecified: Secondary | ICD-10-CM | POA: Diagnosis not present

## 2013-04-21 DIAGNOSIS — E119 Type 2 diabetes mellitus without complications: Secondary | ICD-10-CM | POA: Diagnosis not present

## 2013-04-27 ENCOUNTER — Other Ambulatory Visit: Payer: Self-pay

## 2013-04-27 ENCOUNTER — Telehealth: Payer: Self-pay

## 2013-04-27 ENCOUNTER — Other Ambulatory Visit: Payer: Self-pay | Admitting: Internal Medicine

## 2013-04-27 MED ORDER — HYDROCODONE-ACETAMINOPHEN 7.5-325 MG PO TABS: ORAL_TABLET | ORAL | Status: DC

## 2013-04-27 NOTE — Telephone Encounter (Signed)
Ok to try OTC meds such as Mucinex DM for cough/'/congestion, and tylenol for pain  Hydrocodone - Done hardcopy to robin, but also to let pt know   You are given the letter today explaining the transitional pain medication refill policy due to recent [place] law change  Please be aware that I will no longer be able to offer monthly refills of any Schedule II or higher medication starting Jul 23, 2013

## 2013-04-27 NOTE — Telephone Encounter (Signed)
The patient completed her second eye surgery.  Stated she now has a cold with congestion and cough.  She is requesting something sent in to help with symptoms.  ALSO needs a refill on her Hydrocodone.

## 2013-04-28 NOTE — Telephone Encounter (Signed)
Called the patient left message to call back 

## 2013-04-28 NOTE — Telephone Encounter (Signed)
Called the patient informed of MD instructions on mucinex.  Also informed to pickup (3 month's worth) hardcopy's of hydrocodone.  I did explain to the patient the letter enclosed along with hardcopy's and content of the letter.  The patient did understand all information.

## 2013-05-24 ENCOUNTER — Other Ambulatory Visit: Payer: Self-pay | Admitting: Internal Medicine

## 2013-05-26 DIAGNOSIS — G5600 Carpal tunnel syndrome, unspecified upper limb: Secondary | ICD-10-CM | POA: Diagnosis not present

## 2013-06-01 ENCOUNTER — Other Ambulatory Visit: Payer: Self-pay | Admitting: Internal Medicine

## 2013-06-02 ENCOUNTER — Telehealth: Payer: Self-pay

## 2013-06-02 MED ORDER — ALBUTEROL SULFATE (2.5 MG/3ML) 0.083% IN NEBU: 2.5000 mg | INHALATION_SOLUTION | Freq: Four times a day (QID) | RESPIRATORY_TRACT | Status: DC | PRN

## 2013-06-02 MED ORDER — FLUTICASONE-SALMETEROL 100-50 MCG/DOSE IN AEPB: 1.0000 | INHALATION_SPRAY | Freq: Two times a day (BID) | RESPIRATORY_TRACT | Status: DC

## 2013-06-02 NOTE — Telephone Encounter (Signed)
meds refillled, consider F/u at Sat clinic in AM please

## 2013-06-02 NOTE — Telephone Encounter (Signed)
[facility] RN called to report the patient had some expiratory wheezing today, coughing up brown/pink phelgm and increased SOB, oxygen today was 94%.  She has a nebulizer at home and advair (But RN states has expired).  Advise please.

## 2013-06-02 NOTE — Telephone Encounter (Signed)
Called [facility] left a detailed message of MD instructions and that prescriptions have been refilled.  Called the patient and informed of MD instructions.  She could not schedule for Saturday clinic as had a family funeral to attend in [place] on saturday, but did schedule appointment with PCP on Tuesday 06/06/13.

## 2013-06-05 ENCOUNTER — Ambulatory Visit (INDEPENDENT_AMBULATORY_CARE_PROVIDER_SITE_OTHER): Payer: Medicare Other | Admitting: Endocrinology

## 2013-06-05 ENCOUNTER — Encounter: Payer: Self-pay | Admitting: Endocrinology

## 2013-06-05 VITALS — BP 130/80 | HR 84 | Temp 98.0°F | Ht 64.0 in | Wt 330.0 lb

## 2013-06-05 DIAGNOSIS — E1049 Type 1 diabetes mellitus with other diabetic neurological complication: Secondary | ICD-10-CM | POA: Diagnosis not present

## 2013-06-05 MED ORDER — GLUCOSE BLOOD VI STRP: 1.0000 | ORAL_STRIP | Freq: Two times a day (BID) | Status: DC

## 2013-06-05 NOTE — Progress Notes (Signed)
Subjective:    Patient ID: Christy May, female    DOB: Apr 30, 1951, 62 y.o.   MRN: [id number]  HPI Pt returns for f/u of DM (dx'ed approx 1985, when she presented with vaginitis; she has mild if any neuropathy of the lower extremities; no associated chronic complications; she has been on insulin since 2002; it is characterized by insulin resistance; she has done better with a simpler qd insulin regimen; she has never had severe hypoglycemia or DKA).  she brings a record of her cbg's which i have reviewed today.  It varies from 86-400.  There is no trend throughout the day.   Past Medical History  Diagnosis Date  . Anemia, unspecified   . Extrinsic asthma, unspecified   . Chronic airway obstruction, not elsewhere classified   . Spondylosis of unspecified site without mention of myelopathy   . Dehydration   . Depressive disorder, not elsewhere classified   . Type II or unspecified type diabetes mellitus without mention of complication, not stated as uncontrolled   . Esophageal reflux   . Unspecified essential hypertension   . Unspecified menopausal and postmenopausal disorder   . Obesity, unspecified   . Inflammatory and toxic neuropathy, unspecified   . Unspecified sleep apnea   . Unspecified vitamin D deficiency   . Dysphagia 2007    historyof dysphagia with severe dysmotility by barium swallow-[name]  . Obesity   . Hypertension   . Diabetes mellitus without complication   . Allergic bronchitis   . Sleep apnea   . Gout 01/04/2013    Past Surgical History  Procedure Laterality Date  . Laser sugery      bilateral eyes  . Back surgery      fusion-multiple cervical spine levels-Dr. Lovell Sheehan  . Excision of abcess      ?? Chest  . Dilation and curettage of uterus    . Exploratory laparotomy    . Partial hysterectomy    . Total knee arthroplasty  2005    Left  . Total knee arthroplasty  2005    Right- Dr. Turner Daniels  . Foot surgery      Right X 2  . Cholecystectomy      . Appendectomy    . Tonsillectomy    . Abdominal hysterectomy      History   Social History  . Marital Status: Widowed    Spouse Name: N/A    Number of Children: N/A  . Years of Education: N/A   Occupational History  . [employer] (former)    Social History Main Topics  . Smoking status: Former Smoker    Quit date: 06/22/1986  . Smokeless tobacco: Never Used  . Alcohol Use: No  . Drug Use: No  . Sexual Activity: Not on file   Other Topics Concern  . Not on file   Social History Narrative   ** Merged History Encounter **       Patient does not get regular exercise.      Widowed 2004      Disabled    Current Outpatient Prescriptions on File Prior to Visit  Medication Sig Dispense Refill  . aspirin 81 MG tablet Take 81 mg by mouth daily.        . Blood Glucose Monitoring Suppl (ONE TOUCH ULTRA MINI) W/DEVICE KIT 1 Device by Does not apply route once.      . cholecalciferol (VITAMIN D) 1000 UNITS tablet Take 2,000 Units by mouth daily.        Marland Kitchen  cyclobenzaprine (FLEXERIL) 5 MG tablet TAKE ONE TABLET BY MOUTH THREE TIMES DAILY AS NEEDED FOR MUSCLE SPASM  60 tablet  5  . diclofenac sodium (VOLTAREN) 1 % GEL Apply 4 g topically 4 (four) times daily. As needed to affected area  100 g  5  . Fluticasone-Salmeterol (ADVAIR DISKUS) 100-50 MCG/DOSE AEPB Inhale 1 puff into the lungs every 12 (twelve) hours.  60 each  11  . gabapentin (NEURONTIN) 800 MG tablet Take 1 tablet (800 mg total) by mouth 3 (three) times daily.  270 tablet  3  . HYDROcodone-acetaminophen (NORCO) 7.5-325 MG per tablet TAKE 1 TABLET BY MOUTH EVERY 6 HOURS AS NEEDED FOR PAIN - to fill Jun 26, 2013  120 tablet  0  . insulin NPH (HUMULIN N,NOVOLIN N) 100 UNIT/ML injection Inject 225 Units into the skin every morning.       . irbesartan (AVAPRO) 300 MG tablet TAKE ONE TABLET BY MOUTH ONE TIME DAILY  90 tablet  2  . Lancets (ONETOUCH ULTRASOFT) lancets 1 each by Other route as needed. Use as instructed to  check blood sugar twice daily dx 250.63      . metoprolol (LOPRESSOR) 50 MG tablet TAKE ONE AND ONE-HALF TABLETS BY MOUTH TWICE DAILY  270 tablet  3  . metoprolol (LOPRESSOR) 50 MG tablet TAKE 1 1/2 TABLETS BY MOUTH TWICE DAILY  270 tablet  3  . omeprazole (PRILOSEC) 20 MG capsule Take 1 capsule (20 mg total) by mouth 2 (two) times daily.  180 capsule  3  . oxybutynin (DITROPAN-XL) 10 MG 24 hr tablet Take 1 tablet (10 mg total) by mouth daily.  90 tablet  3  . phentermine 37.5 MG capsule TAKE 1 CAPSULE BY MOUTH ONCE DAILY IN THE MORNING  30 capsule  0  . potassium chloride (K-DUR) 10 MEQ tablet TAKE ONE TABLET BY MOUTH TWICE DAILY  180 tablet  3  . potassium chloride (K-DUR,KLOR-CON) 10 MEQ tablet Take 10 mEq by mouth 2 (two) times daily.      . promethazine (PHENERGAN) 25 MG tablet TAKE 1 TABLET BY MOUTH EVERY 6 HOURS AS NEEDED FOR NAUSEA  30 tablet  1  . traMADol (ULTRAM-ER) 300 MG 24 hr tablet TAKE 1 TABLET BY MOUTH ONCE DAILY  90 tablet  1  . albuterol (PROVENTIL) (2.5 MG/3ML) 0.083% nebulizer solution Take 3 mLs (2.5 mg total) by nebulization every 6 (six) hours as needed. Respiratory distress  75 mL  11  . allopurinol (ZYLOPRIM) 100 MG tablet Take 1 tablet (100 mg total) by mouth daily.  90 tablet  3  . naproxen (NAPROSYN) 500 MG tablet Take 1 tablet (500 mg total) by mouth 2 (two) times daily with a meal.  60 tablet  2   No current facility-administered medications on file prior to visit.    Allergies  Allergen Reactions  . Ace Inhibitors Anaphylaxis  . Ace Inhibitors Anaphylaxis  . Penicillins Hives and Rash  . Adhesive [Tape]   . Penicillins     Unknown   . Tape Hives   Family History  Problem Relation Age of Onset  . Esophageal cancer Brother   . Esophageal cancer Sister     ?  . Colon polyps Brother   . Pancreatic cancer Sister     ?  . Diabetes Sister   . Diabetes      Aunt and Uncle  . Heart disease Maternal Grandfather    BP 130/80  Pulse 84  Temp(Src) 98 F  (  36.7 C) (Oral)  Ht 5\' 4"  (1.626 m)  Wt 330 lb (149.687 kg)  BMI 56.62 kg/m2  SpO2 95%  Review of Systems denies hypoglycemia and weight change    Objective:   Physical Exam VITAL SIGNS:  See vs page GENERAL: no distress.  Morbid obesity.  In wheelchair.    Lab Results  Component Value Date   HGBA1C 9.4* 06/05/2013      Assessment & Plan:  DM: This insulin regimen was chosen from multiple options, for its simplicity.  The benefits of glycemic control must be weighed against the risks of hypoglycemia.  She needs increased rx, but NPH insulin can't be increased. Economic circumstances: these limit insulin options.  However, i've advised pt to change to levemir or lantus, despite their greater cost. Morbid obesity.  This limits exercise rx of DM.

## 2013-06-05 NOTE — Patient Instructions (Addendum)
blood tests are being requested for you today.  You will be contacted with results.   Please come back for a follow-up appointment in 3 months.   check your blood sugar twice a day.  vary the time of day when you check, between before the 3 meals, and at bedtime.  also check if you have symptoms of your blood sugar being too high or too low.  please keep a record of the readings and bring it to your next appointment here.  please call us sooner if your blood sugar goes below 70, or if you have a lot of readings over 200.   On this type of insulin schedule, you should eat meals on a regular schedule.  If a meal is missed or significantly delayed, your blood sugar could go low.   If your blood sugar goes below 100, please write in you book why you think this happened.

## 2013-06-06 ENCOUNTER — Ambulatory Visit (INDEPENDENT_AMBULATORY_CARE_PROVIDER_SITE_OTHER): Payer: Medicare Other | Admitting: Internal Medicine

## 2013-06-06 ENCOUNTER — Encounter: Payer: Self-pay | Admitting: Internal Medicine

## 2013-06-06 VITALS — BP 132/72 | HR 95 | Temp 97.2°F | Wt 332.5 lb

## 2013-06-06 DIAGNOSIS — J209 Acute bronchitis, unspecified: Secondary | ICD-10-CM | POA: Diagnosis not present

## 2013-06-06 DIAGNOSIS — I10 Essential (primary) hypertension: Secondary | ICD-10-CM | POA: Diagnosis not present

## 2013-06-06 DIAGNOSIS — J441 Chronic obstructive pulmonary disease with (acute) exacerbation: Secondary | ICD-10-CM | POA: Diagnosis not present

## 2013-06-06 DIAGNOSIS — E1049 Type 1 diabetes mellitus with other diabetic neurological complication: Secondary | ICD-10-CM

## 2013-06-06 MED ORDER — METHYLPREDNISOLONE ACETATE 80 MG/ML IJ SUSP
80.0000 mg | Freq: Once | INTRAMUSCULAR | Status: AC
  Administered 2013-06-06: 80 mg via INTRAMUSCULAR

## 2013-06-06 MED ORDER — AZITHROMYCIN 250 MG PO TABS: ORAL_TABLET | ORAL | Status: DC

## 2013-06-06 MED ORDER — PROMETHAZINE-CODEINE 6.25-10 MG/5ML PO SYRP: ORAL_SOLUTION | ORAL | Status: DC

## 2013-06-06 NOTE — Assessment & Plan Note (Signed)
Urged to follow sugars even more closely after steroid injection, f/u with onset polys or cbg > 200

## 2013-06-06 NOTE — Assessment & Plan Note (Signed)
Mild, for depomedrol IM today,  to f/u any worsening symptoms or concerns

## 2013-06-06 NOTE — Progress Notes (Signed)
Pre-visit discussion using our clinic review tool. No additional management support is needed unless otherwise documented below in the visit note.  

## 2013-06-06 NOTE — Progress Notes (Signed)
Subjective:    Patient ID: Christy May, female    DOB: 1951/05/07, 62 y.o.   MRN: [id number]  HPI  Here with acute onset mild to mod 2-3 days ST, HA, general weakness and malaise, with prod cough greenish sputum, but Pt denies chest pain, increased sob or doe, wheezing, orthopnea, PND, increased LE swelling, palpitations, dizziness or syncope, except for onset mild wheezing last pm. Pt denies new neurological symptoms such as new headache, or facial or extremity weakness or numbness, but finds herself dropping objects frequently Past Medical History  Diagnosis Date  . Anemia, unspecified   . Extrinsic asthma, unspecified   . Chronic airway obstruction, not elsewhere classified   . Spondylosis of unspecified site without mention of myelopathy   . Dehydration   . Depressive disorder, not elsewhere classified   . Type II or unspecified type diabetes mellitus without mention of complication, not stated as uncontrolled   . Esophageal reflux   . Unspecified essential hypertension   . Unspecified menopausal and postmenopausal disorder   . Obesity, unspecified   . Inflammatory and toxic neuropathy, unspecified   . Unspecified sleep apnea   . Unspecified vitamin D deficiency   . Dysphagia 2007    historyof dysphagia with severe dysmotility by barium swallow-[name]  . Obesity   . Hypertension   . Diabetes mellitus without complication   . Allergic bronchitis   . Sleep apnea   . Gout 01/04/2013   Past Surgical History  Procedure Laterality Date  . Laser sugery      bilateral eyes  . Back surgery      fusion-multiple cervical spine levels-Dr. Lovell Sheehan  . Excision of abcess      ?? Chest  . Dilation and curettage of uterus    . Exploratory laparotomy    . Partial hysterectomy    . Total knee arthroplasty  2005    Left  . Total knee arthroplasty  2005    Right- Dr. Turner Daniels  . Foot surgery      Right X 2  . Cholecystectomy    . Appendectomy    . Tonsillectomy    .  Abdominal hysterectomy      reports that she quit smoking about 26 years ago. She has never used smokeless tobacco. She reports that she does not drink alcohol or use illicit drugs. family history includes Colon polyps in her brother; Diabetes in her sister and another family member; Esophageal cancer in her brother and sister; Heart disease in her maternal grandfather; Pancreatic cancer in her sister. Allergies  Allergen Reactions  . Ace Inhibitors Anaphylaxis  . Ace Inhibitors Anaphylaxis  . Penicillins Hives and Rash  . Adhesive [Tape]   . Penicillins     Unknown   . Tape Hives   Current Outpatient Prescriptions on File Prior to Visit  Medication Sig Dispense Refill  . albuterol (PROVENTIL) (2.5 MG/3ML) 0.083% nebulizer solution Take 3 mLs (2.5 mg total) by nebulization every 6 (six) hours as needed. Respiratory distress  75 mL  11  . allopurinol (ZYLOPRIM) 100 MG tablet Take 1 tablet (100 mg total) by mouth daily.  90 tablet  3  . aspirin 81 MG tablet Take 81 mg by mouth daily.        . Blood Glucose Monitoring Suppl (ONE TOUCH ULTRA MINI) W/DEVICE KIT 1 Device by Does not apply route once.      . cholecalciferol (VITAMIN D) 1000 UNITS tablet Take 2,000 Units by mouth daily.        Marland Kitchen  cyclobenzaprine (FLEXERIL) 5 MG tablet TAKE ONE TABLET BY MOUTH THREE TIMES DAILY AS NEEDED FOR MUSCLE SPASM  60 tablet  5  . diclofenac sodium (VOLTAREN) 1 % GEL Apply 4 g topically 4 (four) times daily. As needed to affected area  100 g  5  . Fluticasone-Salmeterol (ADVAIR DISKUS) 100-50 MCG/DOSE AEPB Inhale 1 puff into the lungs every 12 (twelve) hours.  60 each  11  . gabapentin (NEURONTIN) 800 MG tablet Take 1 tablet (800 mg total) by mouth 3 (three) times daily.  270 tablet  3  . glucose blood (ONE TOUCH ULTRA TEST) test strip 1 each by Other route 2 (two) times daily. And lancets 2/day 250.01  100 each  12  . HYDROcodone-acetaminophen (NORCO) 7.5-325 MG per tablet TAKE 1 TABLET BY MOUTH EVERY 6 HOURS  AS NEEDED FOR PAIN - to fill Jun 26, 2013  120 tablet  0  . insulin NPH (HUMULIN N,NOVOLIN N) 100 UNIT/ML injection Inject 225 Units into the skin every morning.       . irbesartan (AVAPRO) 300 MG tablet TAKE ONE TABLET BY MOUTH ONE TIME DAILY  90 tablet  2  . Lancets (ONETOUCH ULTRASOFT) lancets 1 each by Other route as needed. Use as instructed to check blood sugar twice daily dx 250.63      . metoprolol (LOPRESSOR) 50 MG tablet TAKE ONE AND ONE-HALF TABLETS BY MOUTH TWICE DAILY  270 tablet  3  . metoprolol (LOPRESSOR) 50 MG tablet TAKE 1 1/2 TABLETS BY MOUTH TWICE DAILY  270 tablet  3  . naproxen (NAPROSYN) 500 MG tablet Take 1 tablet (500 mg total) by mouth 2 (two) times daily with a meal.  60 tablet  2  . omeprazole (PRILOSEC) 20 MG capsule Take 1 capsule (20 mg total) by mouth 2 (two) times daily.  180 capsule  3  . oxybutynin (DITROPAN-XL) 10 MG 24 hr tablet Take 1 tablet (10 mg total) by mouth daily.  90 tablet  3  . phentermine 37.5 MG capsule TAKE 1 CAPSULE BY MOUTH ONCE DAILY IN THE MORNING  30 capsule  0  . potassium chloride (K-DUR) 10 MEQ tablet TAKE ONE TABLET BY MOUTH TWICE DAILY  180 tablet  3  . potassium chloride (K-DUR,KLOR-CON) 10 MEQ tablet Take 10 mEq by mouth 2 (two) times daily.      . promethazine (PHENERGAN) 25 MG tablet TAKE 1 TABLET BY MOUTH EVERY 6 HOURS AS NEEDED FOR NAUSEA  30 tablet  1  . tolterodine (DETROL LA) 4 MG 24 hr capsule       . traMADol (ULTRAM-ER) 300 MG 24 hr tablet TAKE 1 TABLET BY MOUTH ONCE DAILY  90 tablet  1   No current facility-administered medications on file prior to visit.     Review of Systems  Constitutional: Negative for unexpected weight change, or unusual diaphoresis  HENT: Negative for tinnitus.   Eyes: Negative for photophobia and visual disturbance.  Respiratory: Negative for choking and stridor.   Gastrointestinal: Negative for vomiting and blood in stool.  Genitourinary: Negative for hematuria and decreased urine volume.    Musculoskeletal: Negative for acute joint swelling Skin: Negative for color change and wound.  Neurological: Negative for tremors and numbness other than noted  Psychiatric/Behavioral: Negative for decreased concentration or  hyperactivity.       Objective:   Physical Exam BP 132/72  Pulse 95  Temp(Src) 97.2 F (36.2 C) (Oral)  Wt 332 lb 8 oz (150.821 kg)  SpO2  92% VS noted, mild ill Constitutional: Pt appears well-developed and well-nourished.  HENT: Head: NCAT.  Right Ear: External ear normal.  Left Ear: External ear normal.  Bilat tm's with mild erythema.  Max sinus areas non tender.  Pharynx with mild erythema, no exudate Eyes: Conjunctivae and EOM are normal. Pupils are equal, round, and reactive to light.  Neck: Normal range of motion. Neck supple.  Cardiovascular: Normal rate and regular rhythm.   Pulmonary/Chest: Effort normal and breath sounds decreased bilat, few wheeze bilat.  Abd:  Soft, NT, non-distended, + BS Neurological: Pt is alert. Not confused  Skin: Skin is warm. No erythema.  Psychiatric: Pt behavior is normal. Thought content normal.      Assessment & Plan:

## 2013-06-06 NOTE — Assessment & Plan Note (Signed)
stable overall by history and exam, recent data reviewed with pt, and pt to continue medical treatment as before,  to f/u any worsening symptoms or concerns BP Readings from Last 3 Encounters:  06/06/13 132/72  06/05/13 130/80  04/17/13 112/68

## 2013-06-06 NOTE — Assessment & Plan Note (Signed)
Mild to mod, for antibx course,  to f/u any worsening symptoms or concerns 

## 2013-06-06 NOTE — Patient Instructions (Signed)
You had the steroid shot today Please take all new medication as prescribed Please continue all other medications as before Please have the pharmacy call with any other refills you may need. You can also use OTC allegra for allergies if needed  Please remember to sign up for My Chart if you have not done so, as this will be important to you in the future with finding out test results, communicating by private email, and scheduling acute appointments online when needed.

## 2013-06-07 ENCOUNTER — Telehealth: Payer: Self-pay

## 2013-06-07 MED ORDER — POTASSIUM CHLORIDE CRYS ER 10 MEQ PO TBCR: 10.0000 meq | EXTENDED_RELEASE_TABLET | Freq: Two times a day (BID) | ORAL | Status: DC

## 2013-06-07 NOTE — Telephone Encounter (Signed)
refill 

## 2013-06-20 DIAGNOSIS — E11319 Type 2 diabetes mellitus with unspecified diabetic retinopathy without macular edema: Secondary | ICD-10-CM | POA: Diagnosis not present

## 2013-06-20 DIAGNOSIS — H2589 Other age-related cataract: Secondary | ICD-10-CM | POA: Diagnosis not present

## 2013-06-20 DIAGNOSIS — E119 Type 2 diabetes mellitus without complications: Secondary | ICD-10-CM | POA: Diagnosis not present

## 2013-06-21 ENCOUNTER — Other Ambulatory Visit: Payer: Self-pay | Admitting: Internal Medicine

## 2013-06-23 NOTE — Telephone Encounter (Signed)
Faxed hardcopy to [facility] 

## 2013-07-15 ENCOUNTER — Emergency Department (HOSPITAL_COMMUNITY)
Admission: EM | Admit: 2013-07-15 | Discharge: 2013-07-15 | Disposition: A | Discharge status: Home / Self Care | Payer: Medicare Other | Attending: Emergency Medicine | Admitting: Emergency Medicine

## 2013-07-15 ENCOUNTER — Other Ambulatory Visit: Payer: Self-pay

## 2013-07-15 ENCOUNTER — Emergency Department (HOSPITAL_COMMUNITY): Payer: Medicare Other

## 2013-07-15 ENCOUNTER — Encounter (HOSPITAL_COMMUNITY): Payer: Self-pay | Admitting: Emergency Medicine

## 2013-07-15 DIAGNOSIS — R5383 Other fatigue: Secondary | ICD-10-CM

## 2013-07-15 DIAGNOSIS — IMO0002 Reserved for concepts with insufficient information to code with codable children: Secondary | ICD-10-CM | POA: Insufficient documentation

## 2013-07-15 DIAGNOSIS — Z7982 Long term (current) use of aspirin: Secondary | ICD-10-CM | POA: Insufficient documentation

## 2013-07-15 DIAGNOSIS — E559 Vitamin D deficiency, unspecified: Secondary | ICD-10-CM | POA: Diagnosis not present

## 2013-07-15 DIAGNOSIS — N179 Acute kidney failure, unspecified: Secondary | ICD-10-CM | POA: Diagnosis not present

## 2013-07-15 DIAGNOSIS — Z8742 Personal history of other diseases of the female genital tract: Secondary | ICD-10-CM | POA: Diagnosis not present

## 2013-07-15 DIAGNOSIS — Z880 Allergy status to penicillin: Secondary | ICD-10-CM | POA: Diagnosis not present

## 2013-07-15 DIAGNOSIS — J449 Chronic obstructive pulmonary disease, unspecified: Secondary | ICD-10-CM | POA: Insufficient documentation

## 2013-07-15 DIAGNOSIS — E119 Type 2 diabetes mellitus without complications: Secondary | ICD-10-CM | POA: Insufficient documentation

## 2013-07-15 DIAGNOSIS — I10 Essential (primary) hypertension: Secondary | ICD-10-CM | POA: Diagnosis not present

## 2013-07-15 DIAGNOSIS — E860 Dehydration: Secondary | ICD-10-CM

## 2013-07-15 DIAGNOSIS — Z87891 Personal history of nicotine dependence: Secondary | ICD-10-CM | POA: Insufficient documentation

## 2013-07-15 DIAGNOSIS — Z8739 Personal history of other diseases of the musculoskeletal system and connective tissue: Secondary | ICD-10-CM | POA: Diagnosis not present

## 2013-07-15 DIAGNOSIS — F3289 Other specified depressive episodes: Secondary | ICD-10-CM | POA: Insufficient documentation

## 2013-07-15 DIAGNOSIS — R5381 Other malaise: Secondary | ICD-10-CM | POA: Diagnosis not present

## 2013-07-15 DIAGNOSIS — R531 Weakness: Secondary | ICD-10-CM

## 2013-07-15 DIAGNOSIS — Z8669 Personal history of other diseases of the nervous system and sense organs: Secondary | ICD-10-CM | POA: Insufficient documentation

## 2013-07-15 DIAGNOSIS — J9819 Other pulmonary collapse: Secondary | ICD-10-CM | POA: Diagnosis not present

## 2013-07-15 DIAGNOSIS — Z794 Long term (current) use of insulin: Secondary | ICD-10-CM | POA: Insufficient documentation

## 2013-07-15 DIAGNOSIS — K219 Gastro-esophageal reflux disease without esophagitis: Secondary | ICD-10-CM | POA: Insufficient documentation

## 2013-07-15 DIAGNOSIS — F329 Major depressive disorder, single episode, unspecified: Secondary | ICD-10-CM | POA: Insufficient documentation

## 2013-07-15 DIAGNOSIS — Z79899 Other long term (current) drug therapy: Secondary | ICD-10-CM | POA: Diagnosis not present

## 2013-07-15 DIAGNOSIS — R296 Repeated falls: Secondary | ICD-10-CM | POA: Insufficient documentation

## 2013-07-15 DIAGNOSIS — J4489 Other specified chronic obstructive pulmonary disease: Secondary | ICD-10-CM | POA: Insufficient documentation

## 2013-07-15 DIAGNOSIS — Y92009 Unspecified place in unspecified non-institutional (private) residence as the place of occurrence of the external cause: Secondary | ICD-10-CM | POA: Insufficient documentation

## 2013-07-15 DIAGNOSIS — Y9389 Activity, other specified: Secondary | ICD-10-CM | POA: Insufficient documentation

## 2013-07-15 DIAGNOSIS — S298XXA Other specified injuries of thorax, initial encounter: Secondary | ICD-10-CM | POA: Insufficient documentation

## 2013-07-15 DIAGNOSIS — E669 Obesity, unspecified: Secondary | ICD-10-CM

## 2013-07-15 DIAGNOSIS — S0990XA Unspecified injury of head, initial encounter: Secondary | ICD-10-CM | POA: Diagnosis not present

## 2013-07-15 DIAGNOSIS — Z862 Personal history of diseases of the blood and blood-forming organs and certain disorders involving the immune mechanism: Secondary | ICD-10-CM | POA: Insufficient documentation

## 2013-07-15 DIAGNOSIS — R079 Chest pain, unspecified: Secondary | ICD-10-CM | POA: Diagnosis not present

## 2013-07-15 LAB — URINALYSIS, ROUTINE W REFLEX MICROSCOPIC
Glucose, UA: NEGATIVE mg/dL
HGB URINE DIPSTICK: NEGATIVE
Ketones, ur: 15 mg/dL — AB
Nitrite: NEGATIVE
Protein, ur: NEGATIVE mg/dL
SPECIFIC GRAVITY, URINE: 1.025 (ref 1.005–1.030)
UROBILINOGEN UA: 0.2 mg/dL (ref 0.0–1.0)
pH: 5 (ref 5.0–8.0)

## 2013-07-15 LAB — URINE MICROSCOPIC-ADD ON

## 2013-07-15 LAB — BASIC METABOLIC PANEL
BUN: 36 mg/dL — ABNORMAL HIGH (ref 6–23)
CALCIUM: 9.5 mg/dL (ref 8.4–10.5)
CO2: 26 meq/L (ref 19–32)
Chloride: 98 mEq/L (ref 96–112)
Creatinine, Ser: 1.79 mg/dL — ABNORMAL HIGH (ref 0.50–1.10)
GFR calc non Af Amer: 29 mL/min — ABNORMAL LOW (ref >90)
GFR, EST AFRICAN AMERICAN: 34 mL/min — AB (ref >90)
Glucose, Bld: 209 mg/dL — ABNORMAL HIGH (ref 70–99)
Potassium: 5.6 mEq/L — ABNORMAL HIGH (ref 3.7–5.3)
Sodium: 138 mEq/L (ref 137–147)

## 2013-07-15 LAB — CBC
HEMATOCRIT: 34.9 % — AB (ref 36.0–46.0)
Hemoglobin: 11.2 g/dL — ABNORMAL LOW (ref 12.0–15.0)
MCH: 27.7 pg (ref 26.0–34.0)
MCHC: 32.1 g/dL (ref 30.0–36.0)
MCV: 86.4 fL (ref 78.0–100.0)
PLATELETS: 202 10*3/uL (ref 150–400)
RBC: 4.04 MIL/uL (ref 3.87–5.11)
RDW: 15.4 % (ref 11.5–15.5)
WBC: 9.2 10*3/uL (ref 4.0–10.5)

## 2013-07-15 LAB — GLUCOSE, CAPILLARY: Glucose-Capillary: 166 mg/dL — ABNORMAL HIGH (ref 70–99)

## 2013-07-15 LAB — TROPONIN I: Troponin I: <0.3 ng/mL (ref <0.30)

## 2013-07-15 MED ORDER — SODIUM CHLORIDE 0.9 % IV BOLUS (SEPSIS)
1000.0000 mL | Freq: Once | INTRAVENOUS | Status: AC
  Administered 2013-07-15: 1000 mL via INTRAVENOUS

## 2013-07-15 MED ORDER — MORPHINE SULFATE 4 MG/ML IJ SOLN
4.0000 mg | Freq: Once | INTRAMUSCULAR | Status: AC
  Administered 2013-07-15: 4 mg via INTRAVENOUS
  Filled 2013-07-15: qty 1

## 2013-07-15 MED ORDER — ONDANSETRON HCL 4 MG/2ML IJ SOLN
4.0000 mg | Freq: Once | INTRAMUSCULAR | Status: AC
  Administered 2013-07-15: 4 mg via INTRAVENOUS
  Filled 2013-07-15: qty 2

## 2013-07-15 NOTE — ED Provider Notes (Signed)
CSN: [id number]     Arrival date & time 07/15/13  0104 History   First MD Initiated Contact with Patient 07/15/13 0203     Chief Complaint  Patient presents with  . Chest Pain  . Hypoglycemia   (Consider location/radiation/quality/duration/timing/severity/associated sxs/prior Treatment) HPI  This patient is a 63 yo diabetic woman who is BIB EMS. Patient was at home when, she said that her chest felt funny. She denies experiencing chest pain. She up to walk across the room or both of her legs gave out and she fell to the floor. She did not lose consciousness. She is able to move her leg but says that she cannot support herself and stand up.  The patient says that she thinks her blood sugar was low when this episode occurred. She drank some Glucerna but then immediately gave her self 20 units of insulin NPH.  The patient denies any recent medication changes. She has not had shortness of breath, cough, fever, abdominal pain, nausea, vomiting, hematuria, hematemesis, hematochezia or melena.  Past Medical History  Diagnosis Date  . Anemia, unspecified   . Extrinsic asthma, unspecified   . Chronic airway obstruction, not elsewhere classified   . Spondylosis of unspecified site without mention of myelopathy   . Dehydration   . Depressive disorder, not elsewhere classified   . Type II or unspecified type diabetes mellitus without mention of complication, not stated as uncontrolled   . Esophageal reflux   . Unspecified essential hypertension   . Unspecified menopausal and postmenopausal disorder   . Obesity, unspecified   . Inflammatory and toxic neuropathy, unspecified   . Unspecified sleep apnea   . Unspecified vitamin D deficiency   . Dysphagia 2007    historyof dysphagia with severe dysmotility by barium swallow-[name]  . Obesity   . Hypertension   . Diabetes mellitus without complication   . Allergic bronchitis   . Sleep apnea   . Gout 01/04/2013   Past Surgical History   Procedure Laterality Date  . Laser sugery      bilateral eyes  . Back surgery      fusion-multiple cervical spine levels-Dr. Arnoldo Morale  . Excision of abcess      ?? Chest  . Dilation and curettage of uterus    . Exploratory laparotomy    . Partial hysterectomy    . Total knee arthroplasty  2005    Left  . Total knee arthroplasty  2005    Right- [name]  . Foot surgery      Right X 2  . Cholecystectomy    . Appendectomy    . Tonsillectomy    . Abdominal hysterectomy     Family History  Problem Relation Age of Onset  . Esophageal cancer Brother   . Esophageal cancer Sister     ?  . Colon polyps Brother   . Pancreatic cancer Sister     ?  . Diabetes Sister   . Diabetes      Aunt and Uncle  . Heart disease Maternal Grandfather    History  Substance Use Topics  . Smoking status: Former Smoker    Quit date: 06/22/1986  . Smokeless tobacco: Never Used  . Alcohol Use: No   OB History   Grav Para Term Preterm Abortions TAB SAB Ect Mult Living                 Review of Systems Ten point review of symptoms performed and is negative with  the exception of symptoms noted above.   Allergies  Ace inhibitors; Penicillins; Tape; and Adhesive  Home Medications   Current Outpatient Rx  Name  Route  Sig  Dispense  Refill  . albuterol (PROVENTIL) (2.5 MG/3ML) 0.083% nebulizer solution   Nebulization   Take 3 mLs (2.5 mg total) by nebulization every 6 (six) hours as needed. Respiratory distress   75 mL   11   . aspirin 81 MG tablet   Oral   Take 81 mg by mouth daily.           . cholecalciferol (VITAMIN D) 1000 UNITS tablet   Oral   Take 2,000 Units by mouth daily.           . cyclobenzaprine (FLEXERIL) 5 MG tablet      TAKE ONE TABLET BY MOUTH THREE TIMES DAILY AS NEEDED FOR MUSCLE SPASM   60 tablet   5   . diclofenac sodium (VOLTAREN) 1 % GEL   Topical   Apply 4 g topically 4 (four) times daily. As needed to affected area   100 g   5   .  Fluticasone-Salmeterol (ADVAIR DISKUS) 100-50 MCG/DOSE AEPB   Inhalation   Inhale 1 puff into the lungs every 12 (twelve) hours.   60 each   11   . gabapentin (NEURONTIN) 800 MG tablet   Oral   Take 1 tablet (800 mg total) by mouth 3 (three) times daily.   270 tablet   3   . HYDROcodone-acetaminophen (NORCO) 7.5-325 MG per tablet      TAKE 1 TABLET BY MOUTH EVERY 6 HOURS AS NEEDED FOR PAIN - to fill Jun 26, 2013   120 tablet   0   . insulin NPH (HUMULIN N,NOVOLIN N) 100 UNIT/ML injection   Subcutaneous   Inject 225 Units into the skin every morning.          . irbesartan (AVAPRO) 300 MG tablet      TAKE ONE TABLET BY MOUTH ONE TIME DAILY   90 tablet   2   . metoprolol (LOPRESSOR) 50 MG tablet   Oral   Take 75 mg by mouth 2 (two) times daily.         Marland Kitchen omeprazole (PRILOSEC) 20 MG capsule   Oral   Take 1 capsule (20 mg total) by mouth 2 (two) times daily.   180 capsule   3   . oxybutynin (DITROPAN-XL) 10 MG 24 hr tablet   Oral   Take 1 tablet (10 mg total) by mouth daily.   90 tablet   3   . potassium chloride (K-DUR) 10 MEQ tablet   Oral   Take 10 mEq by mouth 2 (two) times daily.         . promethazine (PHENERGAN) 25 MG tablet      TAKE 1 TABLET BY MOUTH EVERY 6 HOURS AS NEEDED FOR NAUSEA   30 tablet   1   . promethazine-codeine (PHENERGAN WITH CODEINE) 6.25-10 MG/5ML syrup      TAKE 5 MLS BY MOUTH EVERY 6 HOURS AS NEEDED FOR COUGH   240 mL   1   . tolterodine (DETROL LA) 4 MG 24 hr capsule               . traMADol (ULTRAM-ER) 300 MG 24 hr tablet      TAKE 1 TABLET BY MOUTH ONCE DAILY   90 tablet   0   . Blood Glucose Monitoring Suppl (ONE  TOUCH ULTRA MINI) W/DEVICE KIT   Does not apply   1 Device by Does not apply route once.         Marland Kitchen glucose blood (ONE TOUCH ULTRA TEST) test strip   Other   1 each by Other route 2 (two) times daily. And lancets 2/day 250.01   100 each   12   . Lancets (ONETOUCH ULTRASOFT) lancets   Other    1 each by Other route as needed. Use as instructed to check blood sugar twice daily dx 250.63          BP 96/49  Pulse 98  Temp(Src) 98.7 F (37.1 C) (Oral)  Resp 17  Ht 5' 2.25" (1.581 m)  Wt 332 lb 3 oz (150.679 kg)  BMI 60.28 kg/m2  SpO2 98% Physical Exam Gen: well developed and well nourished appearing Head: NCAT Eyes: PERL, EOMI Nose: no epistaixis or rhinorrhea Mouth/throat: mucosa is moist and pink Neck: supple, no stridor Lungs: CTA B, no wheezing, rhonchi or rales CV: RRR, no murmur, extremities appear well perfused.  Abd: soft, morbidly obese, notender Back: no ttp, no cva ttp Skin: warm and dry Ext: normal to inspection, no dependent edema Neuro: CN ii-xii grossly intact, no focal deficits, good and symmetric strength in all four extremities. Normal finger to nose, normal speech.  Psyche; normal affect,  calm and cooperative.   ED Course  Procedures (including critical care time) Labs Review Labs Reviewed  CBC - Abnormal; Notable for the following:    Hemoglobin 11.2 (*)    HCT 34.9 (*)    All other components within normal limits  BASIC METABOLIC PANEL - Abnormal; Notable for the following:    Potassium 5.6 (*)    Glucose, Bld 209 (*)    BUN 36 (*)    Creatinine, Ser 1.79 (*)    GFR calc non Af Amer 29 (*)    GFR calc Af Amer 34 (*)    All other components within normal limits  GLUCOSE, CAPILLARY - Abnormal; Notable for the following:    Glucose-Capillary 166 (*)    All other components within normal limits  TROPONIN I   Imaging Review Dg Chest 2 View  07/15/2013   CLINICAL DATA:  Chest pain  EXAM: CHEST  2 VIEW  COMPARISON:  10/21/2012  FINDINGS: The heart size and mediastinal contours are within normal limits. Atelectasis noted in the left mid lung and left base. The visualized skeletal structures are unremarkable.  IMPRESSION: Left mid lung and left base atelectasis   Electronically Signed   By: Kerby Moors M.D.   On: 07/15/2013 02:24    EKG:  nsr, no acute ischemic changes, normal intervals, normal axis, normal qrs complex  MDM    Patient has been examined several times in the past couple of hours. She states that she is feeling better after treatment with IV fluids. Labs are notable for some mild acute kidney injury which appears to be prerenal in origin. The patient notes that she has been homebound for several years. She does not use a walker or a cane. However, she walks from one piece of furniture to the next holding onto the furniture to keep her balance. This is her baseline.  0730:  Patient is feeling better. She has taken some steps in her room and feels she is ok for discharge to use her walker at home. She will f/u with her PCP on Monday and return to the ED if necessary.  [name], MD 07/15/13 [phone]

## 2013-07-15 NOTE — ED Notes (Signed)
CBG taken = 166 

## 2013-07-15 NOTE — ED Notes (Signed)
Pt. reports mid chest pain onset this evening and hypoglycemia / fatigue and generalized weakness onset this evening , pt. denies SOB or nausea . Alert and oriented at triage .

## 2013-07-15 NOTE — ED Notes (Signed)
Pt states that she was at home and started to feel nauseated.  Pt states she was trying to walk and stated feeling weak, and fell to the ground.  Pt states after the fall she started to have mid-sternal chest pain.

## 2013-07-15 NOTE — ED Notes (Signed)
Pt up to ambulate with assistance. Pt became very weak after taking several steps. EDP notified.

## 2013-07-18 ENCOUNTER — Encounter: Payer: Self-pay | Admitting: Internal Medicine

## 2013-07-18 ENCOUNTER — Ambulatory Visit (INDEPENDENT_AMBULATORY_CARE_PROVIDER_SITE_OTHER): Payer: Medicare Other | Admitting: Internal Medicine

## 2013-07-18 VITALS — BP 110/62 | HR 92 | Temp 98.7°F | Ht 64.0 in | Wt 331.0 lb

## 2013-07-18 DIAGNOSIS — E1049 Type 1 diabetes mellitus with other diabetic neurological complication: Secondary | ICD-10-CM

## 2013-07-18 DIAGNOSIS — I951 Orthostatic hypotension: Secondary | ICD-10-CM

## 2013-07-18 DIAGNOSIS — E875 Hyperkalemia: Secondary | ICD-10-CM

## 2013-07-18 DIAGNOSIS — N179 Acute kidney failure, unspecified: Secondary | ICD-10-CM | POA: Insufficient documentation

## 2013-07-18 DIAGNOSIS — E1065 Type 1 diabetes mellitus with hyperglycemia: Secondary | ICD-10-CM | POA: Diagnosis not present

## 2013-07-18 NOTE — Assessment & Plan Note (Addendum)
Cant r/o DM related neuropathy, but suspect volume related, likely exac by persistent diuretic effect of hyperglycemia, ultimately needs better DM control, for increased po fluids  Note:  Total time for pt hx, exam, review of record with pt in the room, determination of diagnoses and plan for further eval and tx is > 40 min, with over 50% spent in coordination and counseling of patient

## 2013-07-18 NOTE — Progress Notes (Signed)
Pre-visit discussion using our clinic review tool. No additional management support is needed unless otherwise documented below in the visit note.  

## 2013-07-18 NOTE — Assessment & Plan Note (Signed)
For better DM control, increase po fluids, and f/u next visit bmet, cont other meds for now including ARB but may need to re-consider if f/u labs not improved

## 2013-07-18 NOTE — Assessment & Plan Note (Signed)
No longer on diuretic chronically , with recent persistent elev K, ok to d/c K supplement, fu next visit

## 2013-07-18 NOTE — Patient Instructions (Signed)
OK to stop the potassium (and the oxybutinin if you are taking the detrol) Please continue all other medications as before, and refills have been done if requested. You need to get the sugar under better control, to get the dehydration to stop  Please return in 2 wks for dehydration re-check  Please contact Dr Loanne Drilling regarding a new meter, since this will be needed to check your sugars for better control, and getting the dehydration to resolve

## 2013-07-18 NOTE — Progress Notes (Signed)
Subjective:    Patient ID: Christy May, female    DOB: [birth year], 63 y.o.   MRN: [id number]  HPI    Here after being seen in ER 1/24 with LE weakness, and fear of falling.  Found to have mild AKI, likely prerenal due to dehydration.  As pt was asympt, was encouraged to f/u here.  Overall doing about the same- Pt denies chest pain, increased sob or doe, wheezing, orthopnea, PND, increased LE swelling, palpitations, dizziness or syncope, but still has signficant sense of general weakness with standing.  Has ongoing poorly controlled DM despite seeing endo /Dr Loanne Drilling.  Pt states has lost her meter she had gained free of charge from endo office, and is afraid of asking for another, cannot afford one of her own, and admits to intentionally underdosing her insulin at times when fearful of low sugar. ++ polydipsia/polyuria despite her detrol use. No other GI symptoms to suggest decreased po intake or diarrhea.  Pt denies fever, wt loss, night sweats, loss of appetite, or other constitutional symptoms . Pt continues to have recurring LBP without change in severity, bowel or bladder change, fever, wt loss,  worsening LE pain/numbness/weakness, gait change or falls.  Does not take diuretic.  No longer does this, and is still taking potassium Past Medical History  Diagnosis Date  . Anemia, unspecified   . Extrinsic asthma, unspecified   . Chronic airway obstruction, not elsewhere classified   . Spondylosis of unspecified site without mention of myelopathy   . Dehydration   . Depressive disorder, not elsewhere classified   . Type II or unspecified type diabetes mellitus without mention of complication, not stated as uncontrolled   . Esophageal reflux   . Unspecified essential hypertension   . Unspecified menopausal and postmenopausal disorder   . Obesity, unspecified   . Inflammatory and toxic neuropathy, unspecified   . Unspecified sleep apnea   . Unspecified vitamin D deficiency   . Dysphagia  2007    historyof dysphagia with severe dysmotility by barium swallow-[name]  . Obesity   . Hypertension   . Diabetes mellitus without complication   . Allergic bronchitis   . Sleep apnea   . Gout 01/04/2013   Past Surgical History  Procedure Laterality Date  . Laser sugery      bilateral eyes  . Back surgery      fusion-multiple cervical spine levels-Dr. Arnoldo Morale  . Excision of abcess      ?? Chest  . Dilation and curettage of uterus    . Exploratory laparotomy    . Partial hysterectomy    . Total knee arthroplasty  2005    Left  . Total knee arthroplasty  2005    Right- [name]  . Foot surgery      Right X 2  . Cholecystectomy    . Appendectomy    . Tonsillectomy    . Abdominal hysterectomy      reports that she quit smoking about 27 years ago. She has never used smokeless tobacco. She reports that she does not drink alcohol or use illicit drugs. family history includes Colon polyps in her brother; Diabetes in her sister and another family member; Esophageal cancer in her brother and sister; Heart disease in her maternal grandfather; Pancreatic cancer in her sister. Allergies  Allergen Reactions  . Ace Inhibitors Anaphylaxis  . Penicillins Hives and Rash  . Tape Hives    Paper tape is ok to use  . Adhesive [  Tape] Rash   Current Outpatient Prescriptions on File Prior to Visit  Medication Sig Dispense Refill  . albuterol (PROVENTIL) (2.5 MG/3ML) 0.083% nebulizer solution Take 3 mLs (2.5 mg total) by nebulization every 6 (six) hours as needed. Respiratory distress  75 mL  11  . aspirin 81 MG tablet Take 81 mg by mouth daily.        . Blood Glucose Monitoring Suppl (ONE TOUCH ULTRA MINI) W/DEVICE KIT 1 Device by Does not apply route once.      . cholecalciferol (VITAMIN D) 1000 UNITS tablet Take 2,000 Units by mouth daily.        . cyclobenzaprine (FLEXERIL) 5 MG tablet TAKE ONE TABLET BY MOUTH THREE TIMES DAILY AS NEEDED FOR MUSCLE SPASM  60 tablet  5  . diclofenac  sodium (VOLTAREN) 1 % GEL Apply 4 g topically 4 (four) times daily. As needed to affected area  100 g  5  . Fluticasone-Salmeterol (ADVAIR DISKUS) 100-50 MCG/DOSE AEPB Inhale 1 puff into the lungs every 12 (twelve) hours.  60 each  11  . gabapentin (NEURONTIN) 800 MG tablet Take 1 tablet (800 mg total) by mouth 3 (three) times daily.  270 tablet  3  . glucose blood (ONE TOUCH ULTRA TEST) test strip 1 each by Other route 2 (two) times daily. And lancets 2/day 250.01  100 each  12  . HYDROcodone-acetaminophen (NORCO) 7.5-325 MG per tablet TAKE 1 TABLET BY MOUTH EVERY 6 HOURS AS NEEDED FOR PAIN - to fill Jun 26, 2013  120 tablet  0  . insulin NPH (HUMULIN N,NOVOLIN N) 100 UNIT/ML injection Inject 225 Units into the skin every morning.       . irbesartan (AVAPRO) 300 MG tablet TAKE ONE TABLET BY MOUTH ONE TIME DAILY  90 tablet  2  . Lancets (ONETOUCH ULTRASOFT) lancets 1 each by Other route as needed. Use as instructed to check blood sugar twice daily dx 250.63      . metoprolol (LOPRESSOR) 50 MG tablet Take 75 mg by mouth 2 (two) times daily.      Marland Kitchen omeprazole (PRILOSEC) 20 MG capsule Take 1 capsule (20 mg total) by mouth 2 (two) times daily.  180 capsule  3  . promethazine (PHENERGAN) 25 MG tablet TAKE 1 TABLET BY MOUTH EVERY 6 HOURS AS NEEDED FOR NAUSEA  30 tablet  1  . promethazine-codeine (PHENERGAN WITH CODEINE) 6.25-10 MG/5ML syrup TAKE 5 MLS BY MOUTH EVERY 6 HOURS AS NEEDED FOR COUGH  240 mL  1  . tolterodine (DETROL LA) 4 MG 24 hr capsule       . traMADol (ULTRAM-ER) 300 MG 24 hr tablet TAKE 1 TABLET BY MOUTH ONCE DAILY  90 tablet  0   No current facility-administered medications on file prior to visit.   Review of Systems  Constitutional: Negative for unexpected weight change, or unusual diaphoresis  HENT: Negative for tinnitus.   Eyes: Negative for photophobia and visual disturbance.  Respiratory: Negative for choking and stridor.   Gastrointestinal: Negative for vomiting and blood in  stool.  Genitourinary: Negative for hematuria and decreased urine volume.  Musculoskeletal: Negative for acute joint swelling Skin: Negative for color change and wound.  Neurological: Negative for tremors and numbness other than noted  Psychiatric/Behavioral: Negative for decreased concentration or  hyperactivity.  '    Objective:   Physical Exam BP 110/62  Pulse 92  Temp(Src) 98.7 F (37.1 C) (Oral)  Ht '5\' 4"'  (1.626 m)  Wt 331 lb (  150.141 kg)  BMI 56.79 kg/m2  SpO2 94% VS noted, ++ orthostatic on challenge today - Constitutional: Pt appears well-developed and well-nourished.  HENT: Head: NCAT.  Right Ear: External ear normal.  Left Ear: External ear normal.  Eyes: Conjunctivae and EOM are normal. Pupils are equal, round, and reactive to light.  Neck: Normal range of motion. Neck supple.  Cardiovascular: Normal rate and regular rhythm.   Pulmonary/Chest: Effort normal and breath sounds normal.  Abd:  Soft, NT, non-distended, + BS Neurological: Pt is alert. Not confused  Skin: Skin is warm. No erythema.  Psychiatric: Pt behavior is normal. Thought content normal.     Assessment & Plan:

## 2013-07-18 NOTE — Assessment & Plan Note (Signed)
Asked pt to ask for glucometer, check sugars and report at least any persistent sugars > 200 with endo/dr ellison

## 2013-07-19 ENCOUNTER — Other Ambulatory Visit: Payer: Self-pay | Admitting: Internal Medicine

## 2013-07-19 ENCOUNTER — Other Ambulatory Visit: Payer: Self-pay

## 2013-07-19 MED ORDER — ONETOUCH ULTRA MINI W/DEVICE KIT: 1.0000 | PACK | Freq: Once | Status: DC

## 2013-07-20 ENCOUNTER — Other Ambulatory Visit: Payer: Self-pay

## 2013-07-20 MED ORDER — ONETOUCH ULTRASOFT LANCETS MISC: Status: DC

## 2013-07-20 MED ORDER — GLUCOSE BLOOD VI STRP: ORAL_STRIP | Status: DC

## 2013-07-24 ENCOUNTER — Other Ambulatory Visit: Payer: Self-pay | Admitting: Internal Medicine

## 2013-07-27 ENCOUNTER — Telehealth: Payer: Self-pay

## 2013-07-27 MED ORDER — INSULIN GLARGINE 100 UNIT/ML SOLOSTAR PEN: 200.0000 [IU] | PEN_INJECTOR | SUBCUTANEOUS | Status: DC

## 2013-07-27 NOTE — Telephone Encounter (Signed)
Pt informed

## 2013-07-27 NOTE — Telephone Encounter (Signed)
Ok, i have sent a prescription to your pharmacy Please come back for a follow-up appointment in 1-2 weeks

## 2013-07-27 NOTE — Telephone Encounter (Signed)
Pt called stating that she is ok with changing insulin.  Thanks!

## 2013-08-02 DIAGNOSIS — M5106 Intervertebral disc disorders with myelopathy, lumbar region: Secondary | ICD-10-CM | POA: Diagnosis not present

## 2013-08-02 DIAGNOSIS — M5126 Other intervertebral disc displacement, lumbar region: Secondary | ICD-10-CM | POA: Diagnosis not present

## 2013-08-03 ENCOUNTER — Encounter: Payer: Self-pay | Admitting: Internal Medicine

## 2013-08-03 ENCOUNTER — Ambulatory Visit (INDEPENDENT_AMBULATORY_CARE_PROVIDER_SITE_OTHER): Payer: Medicare Other | Admitting: Internal Medicine

## 2013-08-03 VITALS — BP 130/80 | HR 97 | Temp 97.9°F | Ht 64.0 in | Wt 328.4 lb

## 2013-08-03 DIAGNOSIS — I10 Essential (primary) hypertension: Secondary | ICD-10-CM

## 2013-08-03 DIAGNOSIS — N179 Acute kidney failure, unspecified: Secondary | ICD-10-CM

## 2013-08-03 DIAGNOSIS — J209 Acute bronchitis, unspecified: Secondary | ICD-10-CM | POA: Insufficient documentation

## 2013-08-03 DIAGNOSIS — I951 Orthostatic hypotension: Secondary | ICD-10-CM

## 2013-08-03 DIAGNOSIS — J449 Chronic obstructive pulmonary disease, unspecified: Secondary | ICD-10-CM | POA: Diagnosis not present

## 2013-08-03 DIAGNOSIS — J4489 Other specified chronic obstructive pulmonary disease: Secondary | ICD-10-CM

## 2013-08-03 DIAGNOSIS — Z79899 Other long term (current) drug therapy: Secondary | ICD-10-CM | POA: Diagnosis not present

## 2013-08-03 MED ORDER — HYDROCODONE-HOMATROPINE 5-1.5 MG/5ML PO SYRP: 5.0000 mL | ORAL_SOLUTION | Freq: Four times a day (QID) | ORAL | Status: DC | PRN

## 2013-08-03 MED ORDER — AZITHROMYCIN 250 MG PO TABS: ORAL_TABLET | ORAL | Status: DC

## 2013-08-03 MED ORDER — OXYBUTYNIN CHLORIDE ER 10 MG PO TB24: 10.0000 mg | ORAL_TABLET | Freq: Every day | ORAL | Status: DC

## 2013-08-03 NOTE — Progress Notes (Signed)
Pre-visit discussion using our clinic review tool. No additional management support is needed unless otherwise documented below in the visit note.  

## 2013-08-03 NOTE — Assessment & Plan Note (Signed)
For f/u bmet,  to f/u any worsening symptoms or concerns

## 2013-08-03 NOTE — Progress Notes (Signed)
Subjective:    Patient ID: Christy May, female    DOB: 05/13/1951, 63 y.o.   MRN: [id number]  HPI  Here to f/u, sees endo for better control but Trying a new insulin but very expensive at $268/mo, not yet started.  Still with dry throat and some polyuria. Pt continues to have recurring LBP without change in severity, bowel or bladder change, fever, wt loss,  worsening LE pain/numbness/weakness, gait change or falls. S/p dual epidural yest per dr Doran Clay per [facility] C[facility] at [facility]. Hard to be active.  Also asks to change detrol back to oxybutinin as worked better.  Incidentally with acute onset mild to mod 2-3 days ST, HA, general weakness and malaise, with prod cough greenish sputum, Pt denies chest pain, increased sob or doe, wheezing, orthopnea, PND, increased LE swelling, palpitations, dizziness or syncope.Pt denies new neurological symptoms such as new headache, or facial or extremity weakness or numbness Past Medical History  Diagnosis Date  . Anemia, unspecified   . Extrinsic asthma, unspecified   . Chronic airway obstruction, not elsewhere classified   . Spondylosis of unspecified site without mention of myelopathy   . Dehydration   . Depressive disorder, not elsewhere classified   . Type II or unspecified type diabetes mellitus without mention of complication, not stated as uncontrolled   . Esophageal reflux   . Unspecified essential hypertension   . Unspecified menopausal and postmenopausal disorder   . Obesity, unspecified   . Inflammatory and toxic neuropathy, unspecified   . Unspecified sleep apnea   . Unspecified vitamin D deficiency   . Dysphagia 2007    historyof dysphagia with severe dysmotility by barium swallow-[name]  . Obesity   . Hypertension   . Diabetes mellitus without complication   . Allergic bronchitis   . Sleep apnea   . Gout 01/04/2013   Past Surgical History  Procedure Laterality Date  . Laser sugery      bilateral eyes  . Back  surgery      fusion-multiple cervical spine levels-Dr. Arnoldo Morale  . Excision of abcess      ?? Chest  . Dilation and curettage of uterus    . Exploratory laparotomy    . Partial hysterectomy    . Total knee arthroplasty  2005    Left  . Total knee arthroplasty  2005    Right- [name]  . Foot surgery      Right X 2  . Cholecystectomy    . Appendectomy    . Tonsillectomy    . Abdominal hysterectomy      reports that she quit smoking about 27 years ago. She has never used smokeless tobacco. She reports that she does not drink alcohol or use illicit drugs. family history includes Colon polyps in her brother; Diabetes in her sister and another family member; Esophageal cancer in her brother and sister; Heart disease in her maternal grandfather; Pancreatic cancer in her sister. Allergies  Allergen Reactions  . Ace Inhibitors Anaphylaxis  . Penicillins Hives and Rash  . Tape Hives    Paper tape is ok to use  . Adhesive [Tape] Rash   Current Outpatient Prescriptions on File Prior to Visit  Medication Sig Dispense Refill  . albuterol (PROVENTIL) (2.5 MG/3ML) 0.083% nebulizer solution Take 3 mLs (2.5 mg total) by nebulization every 6 (six) hours as needed. Respiratory distress  75 mL  11  . aspirin 81 MG tablet Take 81 mg by mouth daily.        Marland Kitchen  Blood Glucose Monitoring Suppl (ONE TOUCH ULTRA MINI) W/DEVICE KIT 1 Device by Does not apply route once.  1 each  0  . cholecalciferol (VITAMIN D) 1000 UNITS tablet Take 2,000 Units by mouth daily.        . cyclobenzaprine (FLEXERIL) 5 MG tablet TAKE ONE TABLET BY MOUTH THREE TIMES DAILY AS NEEDED FOR MUSCLE SPASM  60 tablet  5  . diclofenac sodium (VOLTAREN) 1 % GEL Apply 4 g topically 4 (four) times daily. As needed to affected area  100 g  5  . Fluticasone-Salmeterol (ADVAIR DISKUS) 100-50 MCG/DOSE AEPB Inhale 1 puff into the lungs every 12 (twelve) hours.  60 each  11  . gabapentin (NEURONTIN) 800 MG tablet Take 1 tablet (800 mg total) by  mouth 3 (three) times daily.  270 tablet  3  . glucose blood (ONE TOUCH ULTRA TEST) test strip Use to test blood sugars twice a day. Dx 250.63  100 each  12  . HYDROcodone-acetaminophen (NORCO) 7.5-325 MG per tablet TAKE 1 TABLET BY MOUTH EVERY 6 HOURS AS NEEDED FOR PAIN - to fill Jun 26, 2013  120 tablet  0  . Insulin Glargine (LANTUS SOLOSTAR) 100 UNIT/ML Solostar Pen Inject 200 Units into the skin every morning. And pen needles 2/day  25 pen  PRN  . irbesartan (AVAPRO) 300 MG tablet TAKE ONE TABLET BY MOUTH ONE TIME DAILY  90 tablet  2  . Lancets (ONETOUCH ULTRASOFT) lancets Use to test blood sugars twice a day.  100 each  12  . metoprolol (LOPRESSOR) 50 MG tablet Take 75 mg by mouth 2 (two) times daily.      Marland Kitchen omeprazole (PRILOSEC) 20 MG capsule Take 1 capsule (20 mg total) by mouth 2 (two) times daily.  180 capsule  3  . promethazine (PHENERGAN) 25 MG tablet TAKE 1 TABLET BY MOUTH EVERY 6 HOURS AS NEEDED FOR NAUSEA  30 tablet  0  . promethazine-codeine (PHENERGAN WITH CODEINE) 6.25-10 MG/5ML syrup TAKE 5 MLS BY MOUTH EVERY 6 HOURS AS NEEDED FOR COUGH  240 mL  1  . traMADol (ULTRAM-ER) 300 MG 24 hr tablet TAKE 1 TABLET BY MOUTH ONCE DAILY  90 tablet  0   No current facility-administered medications on file prior to visit.     Review of Systems  Constitutional: Negative for unexpected weight change, or unusual diaphoresis  HENT: Negative for tinnitus.   Eyes: Negative for photophobia and visual disturbance.  Respiratory: Negative for choking and stridor.   Gastrointestinal: Negative for vomiting and blood in stool.  Genitourinary: Negative for hematuria and decreased urine volume.  Musculoskeletal: Negative for acute joint swelling Skin: Negative for color change and wound.  Neurological: Negative for tremors and numbness other than noted  Psychiatric/Behavioral: Negative for decreased concentration or  hyperactivity.       Objective:   Physical Exam BP 130/80  Pulse 97  Temp(Src)  97.9 F (36.6 C) (Oral)  Ht '5\' 4"'  (1.626 m)  Wt 328 lb 6 oz (148.95 kg)  BMI 56.34 kg/m2  SpO2 97% VS noted, mild ill Constitutional: Pt appears well-developed and well-nourished.  HENT: Head: NCAT.  Right Ear: External ear normal.  Left Ear: External ear normal.  Bilat tm's with mild erythema.  Max sinus areas non tender.  Pharynx with mild erythema, no exudate Eyes: Conjunctivae and EOM are normal. Pupils are equal, round, and reactive to light.  Neck: Normal range of motion. Neck supple.  Cardiovascular: Normal rate and regular rhythm.  Pulmonary/Chest: Effort normal and breath sounds decreased bilat, .  Abd:  Soft, NT, non-distended, + BS Neurological: Pt is alert. Not confused  Skin: Skin is warm. No erythema.  Psychiatric: Pt behavior is normal. Thought content normal.         Assessment & Plan:

## 2013-08-03 NOTE — Assessment & Plan Note (Signed)
Mild to mod, for antibx course,  to f/u any worsening symptoms or concerns 

## 2013-08-03 NOTE — Assessment & Plan Note (Signed)
Improved,  to f/u any worsening symptoms or concerns 

## 2013-08-03 NOTE — Patient Instructions (Addendum)
Please take all new medication as prescribed Please continue all other medications as before, and refills have been done if requested. Please have the pharmacy call with any other refills you may need.  Please go to the LAB in the Basement (turn left off the elevator) for the tests to be done tomorrow You will be contacted by phone if any changes need to be made immediately.  Otherwise, you will receive a letter about your results with an explanation, but please check with MyChart first.  Please remember to sign up for MyChart if you have not done so, as this will be important to you in the future with finding out test results, communicating by private email, and scheduling acute appointments online when needed.  Please keep your appointments with your specialists as you have planned = Dr Loanne Drilling, and the pain clinic

## 2013-08-03 NOTE — Assessment & Plan Note (Signed)
stable overall by history and exam, recent data reviewed with pt, and pt to continue medical treatment as before,  to f/u any worsening symptoms or concerns BP Readings from Last 3 Encounters:  08/03/13 130/80  07/18/13 110/62  07/15/13 100/56

## 2013-08-03 NOTE — Assessment & Plan Note (Signed)
stable overall by history and exam, recent data reviewed with pt, and pt to continue medical treatment as before,  to f/u any worsening symptoms or concerns SpO2 Readings from Last 3 Encounters:  08/03/13 97%  07/18/13 94%  07/15/13 96%

## 2013-08-04 ENCOUNTER — Telehealth: Payer: Self-pay

## 2013-08-04 NOTE — Telephone Encounter (Signed)
Relevant patient education assigned to patient using Emmi. ° °

## 2013-08-07 DIAGNOSIS — E11311 Type 2 diabetes mellitus with unspecified diabetic retinopathy with macular edema: Secondary | ICD-10-CM | POA: Diagnosis not present

## 2013-08-07 DIAGNOSIS — E11339 Type 2 diabetes mellitus with moderate nonproliferative diabetic retinopathy without macular edema: Secondary | ICD-10-CM | POA: Diagnosis not present

## 2013-08-07 DIAGNOSIS — E1139 Type 2 diabetes mellitus with other diabetic ophthalmic complication: Secondary | ICD-10-CM | POA: Diagnosis not present

## 2013-08-09 ENCOUNTER — Telehealth: Payer: Self-pay | Admitting: *Deleted

## 2013-08-09 ENCOUNTER — Other Ambulatory Visit: Payer: Self-pay | Admitting: Internal Medicine

## 2013-08-09 NOTE — Telephone Encounter (Signed)
It appears that if the blood was drawn, the results were not put on the chart  Robin to call lab; maybe the results were just not put on the chart?  Does pt need to re-draw?

## 2013-08-09 NOTE — Telephone Encounter (Signed)
Patient phoned requesting results from recent labs 08/03/13.  Please advise.  CB# [phone]

## 2013-08-10 ENCOUNTER — Ambulatory Visit: Payer: Medicare Other | Admitting: Podiatry

## 2013-08-10 MED ORDER — PROMETHAZINE-CODEINE 6.25-10 MG/5ML PO SYRP: ORAL_SOLUTION | ORAL | Status: DC

## 2013-08-10 NOTE — Telephone Encounter (Signed)
Change in cough med - Done hardcopy to robin

## 2013-08-10 NOTE — Telephone Encounter (Signed)
Called the lab and the patient did not get blood taken.  Called the patient to inform to go to the lab as order is still ok and to go at convenience.  The patient stated she was given Hycodan cough med. At her last visit and would like promethazine cough med. As hycodan was too expensive.

## 2013-08-10 NOTE — Telephone Encounter (Signed)
Patient informed to pickup hardcopy at the front desk. 

## 2013-08-11 ENCOUNTER — Other Ambulatory Visit (INDEPENDENT_AMBULATORY_CARE_PROVIDER_SITE_OTHER): Payer: Medicare Other

## 2013-08-11 DIAGNOSIS — N179 Acute kidney failure, unspecified: Secondary | ICD-10-CM

## 2013-08-11 LAB — BASIC METABOLIC PANEL
BUN: 23 mg/dL (ref 6–23)
CALCIUM: 9.7 mg/dL (ref 8.4–10.5)
CO2: 30 mEq/L (ref 19–32)
Chloride: 99 mEq/L (ref 96–112)
Creatinine, Ser: 1.3 mg/dL — ABNORMAL HIGH (ref 0.4–1.2)
GFR: 54.53 mL/min — AB (ref >60.00)
Glucose, Bld: 264 mg/dL — ABNORMAL HIGH (ref 70–99)
Potassium: 4.9 mEq/L (ref 3.5–5.1)
SODIUM: 137 meq/L (ref 135–145)

## 2013-08-15 ENCOUNTER — Other Ambulatory Visit: Payer: Self-pay | Admitting: Internal Medicine

## 2013-08-22 ENCOUNTER — Telehealth: Payer: Self-pay | Admitting: Internal Medicine

## 2013-08-22 NOTE — Telephone Encounter (Signed)
Patient Information:  Caller Name: Catheline  Phone: [phone]  Patient: Christy May  Gender: Female  DOB: 1951-05-18  Age: 63 Years  PCP: Cathlean Cower (Adults only)  Office Follow Up:  Does the office need to follow up with this patient?: No  Instructions For The Office: N/A  RN Note:  Patient calling with difficulty breathing and chest pain. 911 activated  Symptoms  Reason For Call & Symptoms: chest pain  Reviewed Health History In EMR: Yes  Reviewed Medications In EMR: Yes  Reviewed Allergies In EMR: Yes  Reviewed Surgeries / Procedures: Yes  Date of Onset of Symptoms: 08/20/2013  Guideline(s) Used:  Chest Pain  Disposition Per Guideline:   Call EMS 911 Now  Reason For Disposition Reached:   Severe difficulty breathing (e.g., struggling for each breath, speaks in single words)  Advice Given:  N/A  Patient Will Follow Care Advice:  YES

## 2013-08-23 ENCOUNTER — Ambulatory Visit: Payer: Medicare Other | Admitting: Internal Medicine

## 2013-08-24 ENCOUNTER — Ambulatory Visit (INDEPENDENT_AMBULATORY_CARE_PROVIDER_SITE_OTHER): Payer: Medicare Other | Admitting: Internal Medicine

## 2013-08-24 ENCOUNTER — Encounter: Payer: Self-pay | Admitting: Internal Medicine

## 2013-08-24 ENCOUNTER — Ambulatory Visit: Payer: Medicare Other | Admitting: Podiatry

## 2013-08-24 VITALS — BP 122/80 | HR 96 | Temp 98.3°F | Ht 64.0 in | Wt 331.4 lb

## 2013-08-24 DIAGNOSIS — F3289 Other specified depressive episodes: Secondary | ICD-10-CM

## 2013-08-24 DIAGNOSIS — G894 Chronic pain syndrome: Secondary | ICD-10-CM | POA: Diagnosis not present

## 2013-08-24 DIAGNOSIS — F329 Major depressive disorder, single episode, unspecified: Secondary | ICD-10-CM | POA: Diagnosis not present

## 2013-08-24 DIAGNOSIS — I10 Essential (primary) hypertension: Secondary | ICD-10-CM | POA: Diagnosis not present

## 2013-08-24 MED ORDER — LIDOCAINE 5 % EX PTCH
1.0000 | MEDICATED_PATCH | CUTANEOUS | Status: DC
Start: 2013-08-24 — End: 2013-10-06

## 2013-08-24 NOTE — Assessment & Plan Note (Signed)
Ongoing bilat knee for several yrs, more recently chronic ant CP c/w msk, for lidoderm prn, tylenol prn, avoid narcotic in future per pt

## 2013-08-24 NOTE — Patient Instructions (Signed)
Please take all new medication as prescribed - the lidoderm as directed,  For pain  Please continue all other medications as before, and refills have been done if requested. Please have the pharmacy call with any other refills you may need.

## 2013-08-24 NOTE — Progress Notes (Signed)
Subjective:    Patient ID: Christy May, female    DOB: Jun 28, 1950, 63 y.o.   MRN: [id number]  HPI  Here to f/u, c/o ongoing pain, recently fell per pt end of feb 2015, and had to be lifted up by EMS at home due to knee pain, couldn't stand. Had some chest pains after that, had been seen in ER jan 20 with normal labs, neg trop so did not go back to ER, c/o mid low SSCP that she attributes to [facility], called night nurse service and declined to go to ER when requested.  Has chronic bilat knee pain.  Has reservations about signing the pain contract form.  Understands she no longer is prescribed the hydrocodone by me, only having the tramadol. Wants to take herseflt off this, and avoid the substance contract, in fact even wanting to have it removed from her EMR charting so she is not stigmatized in the future. Denies worsening depressive symptoms, suicidal ideation, or panic; has ongoing anxiety Past Medical History  Diagnosis Date  . Anemia, unspecified   . Extrinsic asthma, unspecified   . Chronic airway obstruction, not elsewhere classified   . Spondylosis of unspecified site without mention of myelopathy   . Dehydration   . Depressive disorder, not elsewhere classified   . Type II or unspecified type diabetes mellitus without mention of complication, not stated as uncontrolled   . Esophageal reflux   . Unspecified essential hypertension   . Unspecified menopausal and postmenopausal disorder   . Obesity, unspecified   . Inflammatory and toxic neuropathy, unspecified   . Unspecified sleep apnea   . Unspecified vitamin D deficiency   . Dysphagia 2007    historyof dysphagia with severe dysmotility by barium swallow-[name]  . Obesity   . Hypertension   . Diabetes mellitus without complication   . Allergic bronchitis   . Sleep apnea   . Gout 01/04/2013   Past Surgical History  Procedure Laterality Date  . Laser sugery      bilateral eyes  . Back surgery      fusion-multiple  cervical spine levels-Dr. Arnoldo Morale  . Excision of abcess      ?? Chest  . Dilation and curettage of uterus    . Exploratory laparotomy    . Partial hysterectomy    . Total knee arthroplasty  2005    Left  . Total knee arthroplasty  2005    Right- [name]  . Foot surgery      Right X 2  . Cholecystectomy    . Appendectomy    . Tonsillectomy    . Abdominal hysterectomy      reports that she quit smoking about 27 years ago. She has never used smokeless tobacco. She reports that she does not drink alcohol or use illicit drugs. family history includes Colon polyps in her brother; Diabetes in her sister and another family member; Esophageal cancer in her brother and sister; Heart disease in her maternal grandfather; Pancreatic cancer in her sister. Allergies  Allergen Reactions  . Ace Inhibitors Anaphylaxis  . Penicillins Hives and Rash  . Tape Hives    Paper tape is ok to use  . Adhesive [Tape] Rash   Current Outpatient Prescriptions on File Prior to Visit  Medication Sig Dispense Refill  . albuterol (PROVENTIL) (2.5 MG/3ML) 0.083% nebulizer solution Take 3 mLs (2.5 mg total) by nebulization every 6 (six) hours as needed. Respiratory distress  75 mL  11  . aspirin  81 MG tablet Take 81 mg by mouth daily.        . Blood Glucose Monitoring Suppl (ONE TOUCH ULTRA MINI) W/DEVICE KIT 1 Device by Does not apply route once.  1 each  0  . cholecalciferol (VITAMIN D) 1000 UNITS tablet Take 2,000 Units by mouth daily.        . cyclobenzaprine (FLEXERIL) 5 MG tablet TAKE 1 TABLET BY MOUTH THREE TIMES DAILY AS NEEDED FOR MUSCLE SPASM  90 tablet  0  . diclofenac sodium (VOLTAREN) 1 % GEL Apply 4 g topically 4 (four) times daily. As needed to affected area  100 g  5  . Fluticasone-Salmeterol (ADVAIR DISKUS) 100-50 MCG/DOSE AEPB Inhale 1 puff into the lungs every 12 (twelve) hours.  60 each  11  . gabapentin (NEURONTIN) 800 MG tablet Take 1 tablet (800 mg total) by mouth 3 (three) times daily.  270  tablet  3  . glucose blood (ONE TOUCH ULTRA TEST) test strip Use to test blood sugars twice a day. Dx 250.63  100 each  12  . HYDROcodone-acetaminophen (NORCO) 7.5-325 MG per tablet TAKE 1 TABLET BY MOUTH EVERY 6 HOURS AS NEEDED FOR PAIN - to fill Jun 26, 2013  120 tablet  0  . Insulin Glargine (LANTUS SOLOSTAR) 100 UNIT/ML Solostar Pen Inject 200 Units into the skin every morning. And pen needles 2/day  25 pen  PRN  . irbesartan (AVAPRO) 300 MG tablet TAKE ONE TABLET BY MOUTH ONE TIME DAILY  90 tablet  2  . Lancets (ONETOUCH ULTRASOFT) lancets Use to test blood sugars twice a day.  100 each  12  . metoprolol (LOPRESSOR) 50 MG tablet Take 75 mg by mouth 2 (two) times daily.      . omeprazole (PRILOSEC) 20 MG capsule Take 1 capsule (20 mg total) by mouth 2 (two) times daily.  180 capsule  3  . oxybutynin (DITROPAN-XL) 10 MG 24 hr tablet Take 1 tablet (10 mg total) by mouth daily.  90 tablet  3  . promethazine (PHENERGAN) 25 MG tablet TAKE 1 TABLET BY MOUTH EVERY 6 HOURS AS NEEDED FOR NAUSEA  30 tablet  0  . promethazine-codeine (PHENERGAN WITH CODEINE) 6.25-10 MG/5ML syrup TAKE 5 MLS BY MOUTH EVERY 6 HOURS AS NEEDED FOR COUGH  240 mL  1  . traMADol (ULTRAM-ER) 300 MG 24 hr tablet TAKE 1 TABLET BY MOUTH ONCE DAILY  90 tablet  0   No current facility-administered medications on file prior to visit.   Review of Systems  Constitutional: Negative for unexpected weight change, or unusual diaphoresis  HENT: Negative for tinnitus.   Eyes: Negative for photophobia and visual disturbance.  Respiratory: Negative for choking and stridor.   Gastrointestinal: Negative for vomiting and blood in stool.  Genitourinary: Negative for hematuria and decreased urine volume.  Musculoskeletal: Negative for acute joint swelling Skin: Negative for color change and wound.  Neurological: Negative for tremors and numbness other than noted  Psychiatric/Behavioral: Negative for decreased concentration or  hyperactivity.        Objective:   Physical Exam BP 122/80  Pulse 96  Temp(Src) 98.3 F (36.8 C) (Oral)  Ht 5' 4" (1.626 m)  Wt 331 lb 6 oz (150.311 kg)  BMI 56.85 kg/m2  SpO2 97% VS noted,  Constitutional: Pt appears well-developed and well-nourished.  HENT: Head: NCAT.  Right Ear: External ear normal.  Left Ear: External ear normal.  Eyes: Conjunctivae and EOM are normal. Pupils are equal, round, and   reactive to light.  Neck: Normal range of motion. Neck supple.  Cardiovascular: Normal rate and regular rhythm.   Pulmonary/Chest: Effort normal and breath sounds normal.  Abd:  Soft, NT, non-distended, + BS Neurological: Pt is alert. Not confused  Skin: Skin is warm. No erythema.  Psychiatric: Pt behavior is normal. Thought content normal. 2+ nervous     Assessment & Plan:

## 2013-08-24 NOTE — Assessment & Plan Note (Signed)
stable overall by history and exam, denies SI or need for counseling, and pt to continue medical treatment as before,  to f/u any worsening symptoms or concerns

## 2013-08-24 NOTE — Assessment & Plan Note (Signed)
stable overall by history and exam, recent data reviewed with pt, and pt to continue medical treatment as before,  to f/u any worsening symptoms or concerns BP Readings from Last 3 Encounters:  08/24/13 122/80  08/03/13 130/80  07/18/13 110/62

## 2013-08-28 ENCOUNTER — Encounter: Payer: Self-pay | Admitting: Internal Medicine

## 2013-08-31 ENCOUNTER — Ambulatory Visit (INDEPENDENT_AMBULATORY_CARE_PROVIDER_SITE_OTHER): Payer: Medicare Other | Admitting: Podiatry

## 2013-08-31 ENCOUNTER — Encounter: Payer: Self-pay | Admitting: Podiatry

## 2013-08-31 DIAGNOSIS — E1159 Type 2 diabetes mellitus with other circulatory complications: Secondary | ICD-10-CM

## 2013-08-31 DIAGNOSIS — B351 Tinea unguium: Secondary | ICD-10-CM | POA: Diagnosis not present

## 2013-08-31 DIAGNOSIS — M79609 Pain in unspecified limb: Secondary | ICD-10-CM

## 2013-08-31 DIAGNOSIS — Q828 Other specified congenital malformations of skin: Secondary | ICD-10-CM

## 2013-08-31 NOTE — Patient Instructions (Signed)
Diabetes and Foot Care Diabetes may cause you to have problems because of poor blood supply (circulation) to your feet and legs. This may cause the skin on your feet to become thinner, break easier, and heal more slowly. Your skin may become dry, and the skin may peel and crack. You may also have nerve damage in your legs and feet causing decreased feeling in them. You may not notice minor injuries to your feet that could lead to infections or more serious problems. Taking care of your feet is one of the most important things you can do for yourself.  HOME CARE INSTRUCTIONS  Wear shoes at all times, even in the house. Do not go barefoot. Bare feet are easily injured.  Check your feet daily for blisters, cuts, and redness. If you cannot see the bottom of your feet, use a mirror or ask someone for help.  Wash your feet with warm water (do not use hot water) and mild soap. Then pat your feet and the areas between your toes until they are completely dry. Do not soak your feet as this can dry your skin.  Apply a moisturizing lotion or petroleum jelly (that does not contain alcohol and is unscented) to the skin on your feet and to dry, brittle toenails. Do not apply lotion between your toes.  Trim your toenails straight across. Do not dig under them or around the cuticle. File the edges of your nails with an emery board or nail file.  Do not cut corns or calluses or try to remove them with medicine.  Wear clean socks or stockings every day. Make sure they are not too tight. Do not wear knee-high stockings since they may decrease blood flow to your legs.  Wear shoes that fit properly and have enough cushioning. To break in new shoes, wear them for just a few hours a day. This prevents you from injuring your feet. Always look in your shoes before you put them on to be sure there are no objects inside.  Do not cross your legs. This may decrease the blood flow to your feet.  If you find a minor scrape,  cut, or break in the skin on your feet, keep it and the skin around it clean and dry. These areas may be cleansed with mild soap and water. Do not cleanse the area with peroxide, alcohol, or iodine.  When you remove an adhesive bandage, be sure not to damage the skin around it.  If you have a wound, look at it several times a day to make sure it is healing.  Do not use heating pads or hot water bottles. They may burn your skin. If you have lost feeling in your feet or legs, you may not know it is happening until it is too late.  Make sure your health care provider performs a complete foot exam at least annually or more often if you have foot problems. Report any cuts, sores, or bruises to your health care provider immediately. SEEK MEDICAL CARE IF:   You have an injury that is not healing.  You have cuts or breaks in the skin.  You have an ingrown nail.  You notice redness on your legs or feet.  You feel burning or tingling in your legs or feet.  You have pain or cramps in your legs and feet.  Your legs or feet are numb.  Your feet always feel cold. SEEK IMMEDIATE MEDICAL CARE IF:   There is increasing redness,   swelling, or pain in or around a wound.  There is a red line that goes up your leg.  Pus is coming from a wound.  You develop a fever or as directed by your health care provider.  You notice a bad smell coming from an ulcer or wound. Document Released: 06/05/2000 Document Revised: 02/08/2013 Document Reviewed: 11/15/2012 [employer] Patient Information 2014 [employer].  

## 2013-09-01 ENCOUNTER — Other Ambulatory Visit: Payer: Self-pay | Admitting: Internal Medicine

## 2013-09-01 DIAGNOSIS — E11311 Type 2 diabetes mellitus with unspecified diabetic retinopathy with macular edema: Secondary | ICD-10-CM | POA: Diagnosis not present

## 2013-09-01 DIAGNOSIS — E1139 Type 2 diabetes mellitus with other diabetic ophthalmic complication: Secondary | ICD-10-CM | POA: Diagnosis not present

## 2013-09-02 NOTE — Progress Notes (Signed)
Subjective:     Patient ID: Christy May, female   DOB: August 15, 1950, 63 y.o.   MRN: [id number]  HPI patient is found to have thick painful nailbeds 1-5 of both feet and is found to have deformity of both feet with long-term diabetes   Review of Systems     Objective:   Physical Exam Neurovascular status unchanged with edema in the feet and ankles digital deformities and nail disease 1-5 both feet that are thick incurvated and brittle and painful    Assessment:     At risk diabetic with mycotic nail infection and foot structural deformity    Plan:     Patient is casted for diabetic shoes and debridement of nailbeds 1-5 both feet accomplished today with no bleeding noted

## 2013-09-11 DIAGNOSIS — M545 Low back pain, unspecified: Secondary | ICD-10-CM | POA: Diagnosis not present

## 2013-09-14 ENCOUNTER — Other Ambulatory Visit: Payer: Self-pay | Admitting: Internal Medicine

## 2013-09-18 ENCOUNTER — Other Ambulatory Visit: Payer: Self-pay | Admitting: Internal Medicine

## 2013-09-21 ENCOUNTER — Other Ambulatory Visit: Payer: Self-pay | Admitting: Internal Medicine

## 2013-09-21 ENCOUNTER — Telehealth: Payer: Self-pay

## 2013-09-21 MED ORDER — BENZONATATE 100 MG PO CAPS: ORAL_CAPSULE | ORAL | Status: DC

## 2013-09-21 NOTE — Telephone Encounter (Signed)
Pt zpack and cough med denied  Ok to schedule pt for sat clinic if she wants

## 2013-09-21 NOTE — Telephone Encounter (Signed)
The patient called hoping to get a cough medicine called in.  She states she has been having an extreme cough for several days.  Thanks!

## 2013-09-21 NOTE — Telephone Encounter (Signed)
Patient informed rx sent in for cough.

## 2013-09-21 NOTE — Telephone Encounter (Signed)
Patient informed of MD instructions on refills.  The patient agreed to schedule appointment for sat. Clinic.

## 2013-09-21 NOTE — Telephone Encounter (Signed)
Done erx 

## 2013-09-22 ENCOUNTER — Other Ambulatory Visit: Payer: Self-pay | Admitting: Internal Medicine

## 2013-09-23 ENCOUNTER — Ambulatory Visit (INDEPENDENT_AMBULATORY_CARE_PROVIDER_SITE_OTHER): Payer: Medicare Other | Admitting: Family Medicine

## 2013-09-23 ENCOUNTER — Encounter: Payer: Self-pay | Admitting: Family Medicine

## 2013-09-23 VITALS — BP 102/68 | Temp 98.1°F | Wt 330.0 lb

## 2013-09-23 DIAGNOSIS — J441 Chronic obstructive pulmonary disease with (acute) exacerbation: Secondary | ICD-10-CM | POA: Diagnosis not present

## 2013-09-23 DIAGNOSIS — J019 Acute sinusitis, unspecified: Secondary | ICD-10-CM

## 2013-09-23 MED ORDER — ALBUTEROL SULFATE (2.5 MG/3ML) 0.083% IN NEBU
2.5000 mg | INHALATION_SOLUTION | Freq: Once | RESPIRATORY_TRACT | Status: AC
  Administered 2013-09-23: 2.5 mg via RESPIRATORY_TRACT

## 2013-09-23 MED ORDER — SULFAMETHOXAZOLE-TMP DS 800-160 MG PO TABS: 1.0000 | ORAL_TABLET | Freq: Two times a day (BID) | ORAL | Status: DC

## 2013-09-23 MED ORDER — PROMETHAZINE-DM 6.25-15 MG/5ML PO SYRP: 5.0000 mL | ORAL_SOLUTION | Freq: Four times a day (QID) | ORAL | Status: DC | PRN

## 2013-09-23 MED ORDER — BENZONATATE 100 MG PO CAPS: ORAL_CAPSULE | ORAL | Status: DC

## 2013-09-23 NOTE — Progress Notes (Signed)
   Subjective:    Patient ID: Christy May, female    DOB: [birth year], 64 y.o.   MRN: [id number]  HPI URI- sxs started 1 week ago w/ cough, 'really hard'.  Having stress incontinence due to cough.  No known fever.  + sinus pain.  + nasal congestion.  No ear pain.  Using Advair daily.  Not using albuterol.   Review of Systems For ROS see HPI     Objective:   Physical Exam  Vitals reviewed. Constitutional: She appears well-developed and well-nourished. No distress.  HENT:  Head: Normocephalic and atraumatic.  Right Ear: Tympanic membrane normal.  Left Ear: Tympanic membrane normal.  Nose: Mucosal edema and rhinorrhea present. Right sinus exhibits maxillary sinus tenderness and frontal sinus tenderness. Left sinus exhibits maxillary sinus tenderness and frontal sinus tenderness.  Mouth/Throat: Uvula is midline and mucous membranes are normal. Posterior oropharyngeal erythema present. No oropharyngeal exudate.  Eyes: Conjunctivae and EOM are normal. Pupils are equal, round, and reactive to light.  Neck: Normal range of motion. Neck supple.  Cardiovascular: Normal rate, regular rhythm and normal heart sounds.   Pulmonary/Chest: Effort normal. No respiratory distress. She has wheezes (diffuse expiratory wheezes- improved s/p neb tx).  + hacking cough  Lymphadenopathy:    She has no cervical adenopathy.          Assessment & Plan:

## 2013-09-23 NOTE — Patient Instructions (Signed)
Follow up w/ your regular doctor if no improvement Start the Bactrim twice daily for the infection (sinus and bronchitis) Start using your albuterol at home for shortness of breath/wheezing Continue the Advair Use the Tessalon for daytime cough and the promethazine syrup for nights Call with any questions or concerns Hang in there!!

## 2013-09-23 NOTE — Assessment & Plan Note (Signed)
New.  Start abx.  Cough meds prn.  Reviewed supportive care and red flags that should prompt return.  Pt expressed understanding and is in agreement w/ plan.

## 2013-09-23 NOTE — Assessment & Plan Note (Signed)
New to provider.  Wheezing improved w/ 1 neb tx.  Start abx.  Encouraged use of albuterol inhaler at home.  Will hold on oral steroids at this time (in setting of sinus infection and less severe respiratory sxs) but pt to continue inhaled steroids.  Reviewed supportive care and red flags that should prompt return.  Pt expressed understanding and is in agreement w/ plan.

## 2013-09-25 NOTE — Telephone Encounter (Signed)
Refill request for cyclobenzaprine Last filled by MD on - 08/16/2013 #90 x0 Last Appt: 09/23/2013 Next Appt: none Please advise refill?

## 2013-10-04 ENCOUNTER — Ambulatory Visit: Payer: Medicare Other | Admitting: Internal Medicine

## 2013-10-06 ENCOUNTER — Ambulatory Visit (INDEPENDENT_AMBULATORY_CARE_PROVIDER_SITE_OTHER): Payer: Medicare Other | Admitting: Internal Medicine

## 2013-10-06 ENCOUNTER — Other Ambulatory Visit (INDEPENDENT_AMBULATORY_CARE_PROVIDER_SITE_OTHER): Payer: Medicare Other

## 2013-10-06 ENCOUNTER — Other Ambulatory Visit: Payer: Self-pay | Admitting: Internal Medicine

## 2013-10-06 ENCOUNTER — Ambulatory Visit (INDEPENDENT_AMBULATORY_CARE_PROVIDER_SITE_OTHER): Payer: Medicare Other | Admitting: Endocrinology

## 2013-10-06 ENCOUNTER — Encounter: Payer: Self-pay | Admitting: Internal Medicine

## 2013-10-06 ENCOUNTER — Encounter: Payer: Self-pay | Admitting: Endocrinology

## 2013-10-06 VITALS — BP 122/78 | HR 90 | Wt 325.5 lb

## 2013-10-06 VITALS — BP 118/60 | HR 87 | Temp 97.8°F | Ht 64.0 in | Wt 325.0 lb

## 2013-10-06 DIAGNOSIS — J441 Chronic obstructive pulmonary disease with (acute) exacerbation: Secondary | ICD-10-CM | POA: Diagnosis not present

## 2013-10-06 DIAGNOSIS — E785 Hyperlipidemia, unspecified: Secondary | ICD-10-CM | POA: Diagnosis not present

## 2013-10-06 DIAGNOSIS — I10 Essential (primary) hypertension: Secondary | ICD-10-CM

## 2013-10-06 DIAGNOSIS — J019 Acute sinusitis, unspecified: Secondary | ICD-10-CM | POA: Diagnosis not present

## 2013-10-06 DIAGNOSIS — N179 Acute kidney failure, unspecified: Secondary | ICD-10-CM

## 2013-10-06 DIAGNOSIS — E1049 Type 1 diabetes mellitus with other diabetic neurological complication: Secondary | ICD-10-CM | POA: Diagnosis not present

## 2013-10-06 DIAGNOSIS — IMO0001 Reserved for inherently not codable concepts without codable children: Secondary | ICD-10-CM

## 2013-10-06 DIAGNOSIS — E1165 Type 2 diabetes mellitus with hyperglycemia: Secondary | ICD-10-CM

## 2013-10-06 DIAGNOSIS — E1065 Type 1 diabetes mellitus with hyperglycemia: Secondary | ICD-10-CM | POA: Diagnosis not present

## 2013-10-06 LAB — CBC WITH DIFFERENTIAL/PLATELET
Basophils Absolute: 0 10*3/uL (ref 0.0–0.1)
Basophils Relative: 0.5 % (ref 0.0–3.0)
EOS PCT: 1.7 % (ref 0.0–5.0)
Eosinophils Absolute: 0.1 10*3/uL (ref 0.0–0.7)
HCT: 37.9 % (ref 36.0–46.0)
Hemoglobin: 12.4 g/dL (ref 12.0–15.0)
LYMPHS ABS: 2.5 10*3/uL (ref 0.7–4.0)
Lymphocytes Relative: 30.1 % (ref 12.0–46.0)
MCHC: 32.8 g/dL (ref 30.0–36.0)
MCV: 84.6 fl (ref 78.0–100.0)
Monocytes Absolute: 0.3 10*3/uL (ref 0.1–1.0)
Monocytes Relative: 4.2 % (ref 3.0–12.0)
Neutro Abs: 5.2 10*3/uL (ref 1.4–7.7)
Neutrophils Relative %: 63.5 % (ref 43.0–77.0)
PLATELETS: 250 10*3/uL (ref 150.0–400.0)
RBC: 4.48 Mil/uL (ref 3.87–5.11)
RDW: 16.1 % — AB (ref 11.5–14.6)
WBC: 8.2 10*3/uL (ref 4.5–10.5)

## 2013-10-06 LAB — LIPID PANEL
CHOL/HDL RATIO: 4
Cholesterol: 171 mg/dL (ref 0–200)
HDL: 45.8 mg/dL (ref >39.00)
LDL CALC: 102 mg/dL — AB (ref 0–99)
Triglycerides: 116 mg/dL (ref 0.0–149.0)
VLDL: 23.2 mg/dL (ref 0.0–40.0)

## 2013-10-06 LAB — BASIC METABOLIC PANEL
BUN: 36 mg/dL — ABNORMAL HIGH (ref 6–23)
CHLORIDE: 97 meq/L (ref 96–112)
CO2: 27 mEq/L (ref 19–32)
Calcium: 9.7 mg/dL (ref 8.4–10.5)
Creatinine, Ser: 1.5 mg/dL — ABNORMAL HIGH (ref 0.4–1.2)
GFR: 44.3 mL/min — ABNORMAL LOW (ref >60.00)
Glucose, Bld: 332 mg/dL — ABNORMAL HIGH (ref 70–99)
POTASSIUM: 5.3 meq/L — AB (ref 3.5–5.1)
Sodium: 133 mEq/L — ABNORMAL LOW (ref 135–145)

## 2013-10-06 LAB — HEPATIC FUNCTION PANEL
ALK PHOS: 89 U/L (ref 39–117)
ALT: 17 U/L (ref 0–35)
AST: 17 U/L (ref 0–37)
Albumin: 3.4 g/dL — ABNORMAL LOW (ref 3.5–5.2)
BILIRUBIN TOTAL: 0.6 mg/dL (ref 0.3–1.2)
Bilirubin, Direct: 0 mg/dL (ref 0.0–0.3)
Total Protein: 7.7 g/dL (ref 6.0–8.3)

## 2013-10-06 LAB — TSH: TSH: 3.28 u[IU]/mL (ref 0.35–5.50)

## 2013-10-06 LAB — HEMOGLOBIN A1C: HEMOGLOBIN A1C: 10.1 % — AB (ref 4.6–6.5)

## 2013-10-06 MED ORDER — FLUTICASONE-SALMETEROL 500-50 MCG/DOSE IN AEPB: 1.0000 | INHALATION_SPRAY | Freq: Two times a day (BID) | RESPIRATORY_TRACT | Status: DC

## 2013-10-06 MED ORDER — GLUCOSE BLOOD VI STRP: ORAL_STRIP | Status: DC

## 2013-10-06 NOTE — Progress Notes (Signed)
Subjective:    Patient ID: Christy May, female    DOB: [birth year], 63 y.o.   MRN: [id number]  HPI  Here to f/u after recent sinusisit/copd exac tx with antibx, overall improved without fever and less HA, general weakness, better stamina adn no fever, but with mild persistent wheeze/sob/nonprod cough despite good compliacne with meds.  Pt denies chest pain, increased sob or doe, wheezing, orthopnea, PND, increased LE swelling, palpitations, dizziness or syncope.  Pt denies new neurological symptoms such as new headache, or facial or extremity weakness or numbness   Pt denies polydipsia, polyuria, No other current complaints Past Medical History  Diagnosis Date  . Anemia, unspecified   . Extrinsic asthma, unspecified   . Chronic airway obstruction, not elsewhere classified   . Spondylosis of unspecified site without mention of myelopathy   . Dehydration   . Depressive disorder, not elsewhere classified   . Type II or unspecified type diabetes mellitus without mention of complication, not stated as uncontrolled   . Esophageal reflux   . Unspecified essential hypertension   . Unspecified menopausal and postmenopausal disorder   . Obesity, unspecified   . Inflammatory and toxic neuropathy, unspecified   . Unspecified sleep apnea   . Unspecified vitamin D deficiency   . Dysphagia 2007    historyof dysphagia with severe dysmotility by barium swallow-[name]  . Obesity   . Hypertension   . Diabetes mellitus without complication   . Allergic bronchitis   . Sleep apnea   . Gout 01/04/2013   Past Surgical History  Procedure Laterality Date  . Laser sugery      bilateral eyes  . Back surgery      fusion-multiple cervical spine levels-Dr. Arnoldo Morale  . Excision of abcess      ?? Chest  . Dilation and curettage of uterus    . Exploratory laparotomy    . Partial hysterectomy    . Total knee arthroplasty  2005    Left  . Total knee arthroplasty  2005    Right- [name]  . Foot  surgery      Right X 2  . Cholecystectomy    . Appendectomy    . Tonsillectomy    . Abdominal hysterectomy      reports that she quit smoking about 27 years ago. She has never used smokeless tobacco. She reports that she does not drink alcohol or use illicit drugs. family history includes Colon polyps in her brother; Diabetes in her sister and another family member; Esophageal cancer in her brother and sister; Heart disease in her maternal grandfather; Pancreatic cancer in her sister. Allergies  Allergen Reactions  . Ace Inhibitors Anaphylaxis  . Penicillins Hives and Rash  . Tape Hives    Paper tape is ok to use  . Adhesive [Tape] Rash   Current Outpatient Prescriptions on File Prior to Visit  Medication Sig Dispense Refill  . albuterol (PROVENTIL) (2.5 MG/3ML) 0.083% nebulizer solution Take 3 mLs (2.5 mg total) by nebulization every 6 (six) hours as needed. Respiratory distress  75 mL  11  . aspirin 81 MG tablet Take 81 mg by mouth daily.        . benzonatate (TESSALON PERLES) 100 MG capsule 1-2 tabs by mouth every 6 hrs as needed for cough  60 capsule  1  . cholecalciferol (VITAMIN D) 1000 UNITS tablet Take 2,000 Units by mouth daily.        . cyclobenzaprine (FLEXERIL) 5 MG tablet TAKE  1 TABLET BY MOUTH THREE TIMES DAILY AS NEEDED FOR MUSCLE SPASMS  90 tablet  0  . diclofenac sodium (VOLTAREN) 1 % GEL Apply 4 g topically 4 (four) times daily. As needed to affected area  100 g  5  . gabapentin (NEURONTIN) 800 MG tablet Take 1 tablet (800 mg total) by mouth 3 (three) times daily.  270 tablet  3  . irbesartan (AVAPRO) 300 MG tablet TAKE ONE TABLET BY MOUTH ONE TIME DAILY  90 tablet  2  . Lancets (ONETOUCH ULTRASOFT) lancets Use to test blood sugars twice a day.  100 each  12  . metoprolol (LOPRESSOR) 50 MG tablet Take 75 mg by mouth 2 (two) times daily.      Marland Kitchen omeprazole (PRILOSEC) 20 MG capsule Take 1 capsule (20 mg total) by mouth 2 (two) times daily.  180 capsule  3  . oxybutynin  (DITROPAN-XL) 10 MG 24 hr tablet Take 1 tablet (10 mg total) by mouth daily.  90 tablet  3  . promethazine (PHENERGAN) 25 MG tablet TAKE 1 TABLET EVERY 6 HOURS AS NEEDED FOR NAUSEA  30 tablet  0  . promethazine-dextromethorphan (PROMETHAZINE-DM) 6.25-15 MG/5ML syrup Take 5 mLs by mouth 4 (four) times daily as needed.  240 mL  0   No current facility-administered medications on file prior to visit.   Review of Systems  Constitutional: Negative for unexpected weight change, or unusual diaphoresis  HENT: Negative for tinnitus.   Eyes: Negative for photophobia and visual disturbance.  Respiratory: Negative for choking and stridor.   Gastrointestinal: Negative for vomiting and blood in stool.  Genitourinary: Negative for hematuria and decreased urine volume.  Musculoskeletal: Negative for acute joint swelling Skin: Negative for color change and wound.  Neurological: Negative for tremors and numbness other than noted  Psychiatric/Behavioral: Negative for decreased concentration or  hyperactivity.       Objective:   Physical Exam BP 122/78  Pulse 90  Wt 325 lb 8 oz (147.646 kg)  SpO2 91% VS noted,  Constitutional: Pt appears well-developed and well-nourished.  HENT: Head: NCAT.  Right Ear: External ear normal.  Left Ear: External ear normal.  Bilat tm's with mild erythema.  Max sinus areas non tender.  Pharynx with mild erythema, no exudate Eyes: Conjunctivae and EOM are normal. Pupils are equal, round, and reactive to light.  Neck: Normal range of motion. Neck supple.  Cardiovascular: Normal rate and regular rhythm.   Pulmonary/Chest: Effort normal and breath sounds mild decreased, few wheeze bilat.  Neurological: Pt is alert. Not confused  Skin: Skin is warm. No erythema.  Psychiatric: Pt behavior is normal. Thought content normal.      Assessment & Plan:

## 2013-10-06 NOTE — Progress Notes (Signed)
Subjective:    Patient ID: Christy May, female    DOB: [birth year], 63 y.o.   MRN: [id number]  HPI Pt returns for f/u of DM (dx'ed approx 1985, when she presented with vaginitis; she has mild if any neuropathy of the lower extremities; no associated chronic complications; she has been on insulin since 2002; it is characterized by insulin resistance; she has done better with a simpler qd insulin regimen; she has never had severe hypoglycemia or DKA).  We downloaded pt's meter.  All cbg's are in the 300's.  It has been high x 1 month.  She is unable to cite any reason for this.  She has not been on any steroids, except for advair.   Past Medical History  Diagnosis Date  . Anemia, unspecified   . Extrinsic asthma, unspecified   . Chronic airway obstruction, not elsewhere classified   . Spondylosis of unspecified site without mention of myelopathy   . Dehydration   . Depressive disorder, not elsewhere classified   . Type II or unspecified type diabetes mellitus without mention of complication, not stated as uncontrolled   . Esophageal reflux   . Unspecified essential hypertension   . Unspecified menopausal and postmenopausal disorder   . Obesity, unspecified   . Inflammatory and toxic neuropathy, unspecified   . Unspecified sleep apnea   . Unspecified vitamin D deficiency   . Dysphagia 2007    historyof dysphagia with severe dysmotility by barium swallow-[name]  . Obesity   . Hypertension   . Diabetes mellitus without complication   . Allergic bronchitis   . Sleep apnea   . Gout 01/04/2013    Past Surgical History  Procedure Laterality Date  . Laser sugery      bilateral eyes  . Back surgery      fusion-multiple cervical spine levels-Dr. Arnoldo Morale  . Excision of abcess      ?? Chest  . Dilation and curettage of uterus    . Exploratory laparotomy    . Partial hysterectomy    . Total knee arthroplasty  2005    Left  . Total knee arthroplasty  2005    Right- [name]    . Foot surgery      Right X 2  . Cholecystectomy    . Appendectomy    . Tonsillectomy    . Abdominal hysterectomy      History   Social History  . Marital Status: Widowed    Spouse Name: N/A    Number of Children: N/A  . Years of Education: N/A   Occupational History  . [employer] (former)    Social History Main Topics  . Smoking status: Former Smoker    Quit date: 06/22/1986  . Smokeless tobacco: Never Used  . Alcohol Use: No  . Drug Use: No  . Sexual Activity: Not on file   Other Topics Concern  . Not on file   Social History Narrative   ** Merged History Encounter **       Patient does not get regular exercise.      Widowed 2004      Disabled    Current Outpatient Prescriptions on File Prior to Visit  Medication Sig Dispense Refill  . albuterol (PROVENTIL) (2.5 MG/3ML) 0.083% nebulizer solution Take 3 mLs (2.5 mg total) by nebulization every 6 (six) hours as needed. Respiratory distress  75 mL  11  . aspirin 81 MG tablet Take 81 mg by mouth daily.        Marland Kitchen  benzonatate (TESSALON PERLES) 100 MG capsule 1-2 tabs by mouth every 6 hrs as needed for cough  60 capsule  1  . cholecalciferol (VITAMIN D) 1000 UNITS tablet Take 2,000 Units by mouth daily.        . cyclobenzaprine (FLEXERIL) 5 MG tablet TAKE 1 TABLET BY MOUTH THREE TIMES DAILY AS NEEDED FOR MUSCLE SPASMS  90 tablet  0  . diclofenac sodium (VOLTAREN) 1 % GEL Apply 4 g topically 4 (four) times daily. As needed to affected area  100 g  5  . gabapentin (NEURONTIN) 800 MG tablet Take 1 tablet (800 mg total) by mouth 3 (three) times daily.  270 tablet  3  . irbesartan (AVAPRO) 300 MG tablet TAKE ONE TABLET BY MOUTH ONE TIME DAILY  90 tablet  2  . Lancets (ONETOUCH ULTRASOFT) lancets Use to test blood sugars twice a day.  100 each  12  . metoprolol (LOPRESSOR) 50 MG tablet Take 75 mg by mouth 2 (two) times daily.      Marland Kitchen omeprazole (PRILOSEC) 20 MG capsule Take 1 capsule (20 mg total) by mouth 2 (two) times  daily.  180 capsule  3  . oxybutynin (DITROPAN-XL) 10 MG 24 hr tablet Take 1 tablet (10 mg total) by mouth daily.  90 tablet  3  . promethazine (PHENERGAN) 25 MG tablet TAKE 1 TABLET EVERY 6 HOURS AS NEEDED FOR NAUSEA  30 tablet  0  . promethazine-dextromethorphan (PROMETHAZINE-DM) 6.25-15 MG/5ML syrup Take 5 mLs by mouth 4 (four) times daily as needed.  240 mL  0  . Fluticasone-Salmeterol (ADVAIR DISKUS) 500-50 MCG/DOSE AEPB Inhale 1 puff into the lungs 2 (two) times daily.  60 each  11   No current facility-administered medications on file prior to visit.    Allergies  Allergen Reactions  . Ace Inhibitors Anaphylaxis  . Penicillins Hives and Rash  . Tape Hives    Paper tape is ok to use  . Adhesive [Tape] Rash    Family History  Problem Relation Age of Onset  . Esophageal cancer Brother   . Esophageal cancer Sister     ?  . Colon polyps Brother   . Pancreatic cancer Sister     ?  . Diabetes Sister   . Diabetes      Aunt and Uncle  . Heart disease Maternal Grandfather     BP 118/60  Pulse 87  Temp(Src) 97.8 F (36.6 C) (Oral)  Ht 5\' 4"  (1.626 m)  Wt 325 lb (147.419 kg)  BMI 55.76 kg/m2  SpO2 97%  Review of Systems She denies hypoglycemia.  She has lost a few lbs.     Objective:   Physical Exam VITAL SIGNS:  See vs page GENERAL: no distress  Lab Results  Component Value Date   HGBA1C 10.1* 10/06/2013      Assessment & Plan:  DM: glycemic control is much worse: uncertain reason for this deterioration. Weight loss: prob due to severe hyperglycemia.   Morbid obesity.  This limits exercise rx of DM.

## 2013-10-06 NOTE — Assessment & Plan Note (Signed)
Improving overall, for change advair from 100-50 to 500-50 bid, likely temporary with eye to wean as able

## 2013-10-06 NOTE — Assessment & Plan Note (Signed)
Improved, no further antibx needed at this time, pt reassured

## 2013-10-06 NOTE — Assessment & Plan Note (Signed)
stable overall by history and exam, recent data reviewed with pt, and pt to continue medical treatment as before,  to f/u any worsening symptoms or concerns BP Readings from Last 3 Encounters:  10/06/13 122/78  10/06/13 118/60  09/23/13 102/68

## 2013-10-06 NOTE — Assessment & Plan Note (Signed)
Also for a1c as requested per Dr Loanne Drilling

## 2013-10-06 NOTE — Patient Instructions (Signed)
OK to change the Advair 100-50 to the 500-50, with 1 puff twice per day (and you have the sample today, which should last 2 wks) - and a new prescription was sent  Please continue all other medications as before, and refills have been done if requested. Please have the pharmacy call with any other refills you may need.  Your lab test results should be available by tomorrow  Please keep your appointments with your specialists as you have planned - Dr Loanne Drilling  Please return in 6 months, or sooner if needed

## 2013-10-06 NOTE — Progress Notes (Signed)
Pre visit review using our clinic review tool, if applicable. No additional management support is needed unless otherwise documented below in the visit note. 

## 2013-10-06 NOTE — Assessment & Plan Note (Signed)
For f/u labs, volume stable,  to f/u any worsening symptoms or concerns

## 2013-10-06 NOTE — Patient Instructions (Addendum)
blood tests are being requested for you today.  You will be contacted with results.   Please come back for a follow-up appointment in 2 weeks.   check your blood sugar twice a day.  vary the time of day when you check, between before the 3 meals, and at bedtime.  also check if you have symptoms of your blood sugar being too high or too low.  please keep a record of the readings and bring it to your next appointment here.  please call us sooner if your blood sugar goes below 70, or if you have a lot of readings over 200.   On this type of insulin schedule, you should eat meals on a regular schedule.  If a meal is missed or significantly delayed, your blood sugar could go low.   Please increase the insulin to 250 units each morning.

## 2013-10-13 ENCOUNTER — Other Ambulatory Visit: Payer: Self-pay | Admitting: Family Medicine

## 2013-10-14 ENCOUNTER — Encounter (HOSPITAL_COMMUNITY): Payer: Self-pay | Admitting: Emergency Medicine

## 2013-10-14 ENCOUNTER — Emergency Department (HOSPITAL_COMMUNITY)
Admission: EM | Admit: 2013-10-14 | Discharge: 2013-10-14 | Disposition: A | Discharge status: Home / Self Care | Payer: Medicare Other | Attending: Emergency Medicine | Admitting: Emergency Medicine

## 2013-10-14 DIAGNOSIS — Z794 Long term (current) use of insulin: Secondary | ICD-10-CM | POA: Insufficient documentation

## 2013-10-14 DIAGNOSIS — Z8742 Personal history of other diseases of the female genital tract: Secondary | ICD-10-CM | POA: Insufficient documentation

## 2013-10-14 DIAGNOSIS — Z862 Personal history of diseases of the blood and blood-forming organs and certain disorders involving the immune mechanism: Secondary | ICD-10-CM | POA: Insufficient documentation

## 2013-10-14 DIAGNOSIS — K219 Gastro-esophageal reflux disease without esophagitis: Secondary | ICD-10-CM | POA: Diagnosis not present

## 2013-10-14 DIAGNOSIS — E669 Obesity, unspecified: Secondary | ICD-10-CM | POA: Diagnosis not present

## 2013-10-14 DIAGNOSIS — B372 Candidiasis of skin and nail: Secondary | ICD-10-CM | POA: Insufficient documentation

## 2013-10-14 DIAGNOSIS — Z8669 Personal history of other diseases of the nervous system and sense organs: Secondary | ICD-10-CM | POA: Diagnosis not present

## 2013-10-14 DIAGNOSIS — I10 Essential (primary) hypertension: Secondary | ICD-10-CM | POA: Diagnosis not present

## 2013-10-14 DIAGNOSIS — Z87891 Personal history of nicotine dependence: Secondary | ICD-10-CM | POA: Diagnosis not present

## 2013-10-14 DIAGNOSIS — E119 Type 2 diabetes mellitus without complications: Secondary | ICD-10-CM | POA: Diagnosis not present

## 2013-10-14 DIAGNOSIS — J449 Chronic obstructive pulmonary disease, unspecified: Secondary | ICD-10-CM | POA: Diagnosis not present

## 2013-10-14 DIAGNOSIS — Z880 Allergy status to penicillin: Secondary | ICD-10-CM | POA: Diagnosis not present

## 2013-10-14 DIAGNOSIS — J4489 Other specified chronic obstructive pulmonary disease: Secondary | ICD-10-CM | POA: Insufficient documentation

## 2013-10-14 DIAGNOSIS — B379 Candidiasis, unspecified: Secondary | ICD-10-CM | POA: Diagnosis not present

## 2013-10-14 DIAGNOSIS — Z7982 Long term (current) use of aspirin: Secondary | ICD-10-CM | POA: Insufficient documentation

## 2013-10-14 DIAGNOSIS — Z8739 Personal history of other diseases of the musculoskeletal system and connective tissue: Secondary | ICD-10-CM | POA: Diagnosis not present

## 2013-10-14 DIAGNOSIS — IMO0002 Reserved for concepts with insufficient information to code with codable children: Secondary | ICD-10-CM | POA: Diagnosis not present

## 2013-10-14 DIAGNOSIS — Z79899 Other long term (current) drug therapy: Secondary | ICD-10-CM | POA: Diagnosis not present

## 2013-10-14 DIAGNOSIS — Z8659 Personal history of other mental and behavioral disorders: Secondary | ICD-10-CM | POA: Diagnosis not present

## 2013-10-14 MED ORDER — NYSTATIN 100000 UNIT/GM EX CREA: TOPICAL_CREAM | CUTANEOUS | Status: DC

## 2013-10-14 NOTE — ED Notes (Signed)
Reports rash and burning pain under right breast.

## 2013-10-14 NOTE — Discharge Instructions (Signed)
As discussed you need to keep the area as dry as possible. If there appears to be any infection developing. Follow up with your doctor Candida Infection, Adult A candida infection (also called yeast, fungus and Monilia infection) is an overgrowth of yeast that can occur anywhere on the body. A yeast infection commonly occurs in warm, moist body areas. Usually, the infection remains localized but can spread to become a systemic infection. A yeast infection may be a sign of a more severe disease such as diabetes, leukemia, or AIDS. A yeast infection can occur in both men and women. In women, Candida vaginitis is a vaginal infection. It is one of the most common causes of vaginitis. Men usually do not have symptoms or know they have an infection until other problems develop. Men may find out they have a yeast infection because their sex partner has a yeast infection. Uncircumcised men are more likely to get a yeast infection than circumcised men. This is because the uncircumcised glans is not exposed to air and does not remain as dry as that of a circumcised glans. Older adults may develop yeast infections around dentures. CAUSES  Women  Antibiotics.  Steroid medication taken for a long time.  Being overweight (obese).  Diabetes.  Poor immune condition.  Certain serious medical conditions.  Immune suppressive medications for organ transplant patients.  Chemotherapy.  Pregnancy.  Menstration.  Stress and fatigue.  Intravenous drug use.  Oral contraceptives.  Wearing tight-fitting clothes in the crotch area.  Catching it from a sex partner who has a yeast infection.  Spermicide.  Intravenous, urinary, or other catheters. Men  Catching it from a sex partner who has a yeast infection.  Having oral or anal sex with a person who has the infection.  Spermicide.  Diabetes.  Antibiotics.  Poor immune system.  Medications that suppress the immune system.  Intravenous drug  use.  Intravenous, urinary, or other catheters. SYMPTOMS  Women  Thick, white vaginal discharge.  Vaginal itching.  Redness and swelling in and around the vagina.  Irritation of the lips of the vagina and perineum.  Blisters on the vaginal lips and perineum.  Painful sexual intercourse.  Low blood sugar (hypoglycemia).  Painful urination.  Bladder infections.  Intestinal problems such as constipation, indigestion, bad breath, bloating, increase in gas, diarrhea, or loose stools. Men  Men may develop intestinal problems such as constipation, indigestion, bad breath, bloating, increase in gas, diarrhea, or loose stools.  Dry, cracked skin on the penis with itching or discomfort.  Jock itch.  Dry, flaky skin.  Athlete's foot.  Hypoglycemia. DIAGNOSIS  Women  A history and an exam are performed.  The discharge may be examined under a microscope.  A culture may be taken of the discharge. Men  A history and an exam are performed.  Any discharge from the penis or areas of cracked skin will be looked at under the microscope and cultured.  Stool samples may be cultured. TREATMENT  Women  Vaginal antifungal suppositories and creams.  Medicated creams to decrease irritation and itching on the outside of the vagina.  Warm compresses to the perineal area to decrease swelling and discomfort.  Oral antifungal medications.  Medicated vaginal suppositories or cream for repeated or recurrent infections.  Wash and dry the irritation areas before applying the cream.  Eating yogurt with lactobacillus may help with prevention and treatment.  Sometimes painting the vagina with gentian violet solution may help if creams and suppositories do not work. Men  Antifungal creams and oral antifungal medications.  Sometimes treatment must continue for 30 days after the symptoms go away to prevent recurrence. HOME CARE INSTRUCTIONS  Women  Use cotton underwear and avoid  tight-fitting clothing.  Avoid colored, scented toilet paper and deodorant tampons or pads.  Do not douche.  Keep your diabetes under control.  Finish all the prescribed medications.  Keep your skin clean and dry.  Consume milk or yogurt with lactobacillus active culture regularly. If you get frequent yeast infections and think that is what the infection is, there are over-the-counter medications that you can get. If the infection does not show healing in 3 days, talk to your caregiver.  Tell your sex partner you have a yeast infection. Your partner may need treatment also, especially if your infection does not clear up or recurs. Men  Keep your skin clean and dry.  Keep your diabetes under control.  Finish all prescribed medications.  Tell your sex partner that you have a yeast infection so they can be treated if necessary. SEEK MEDICAL CARE IF:   Your symptoms do not clear up or worsen in one week after treatment.  You have an oral temperature above 102 F (38.9 C).  You have trouble swallowing or eating for a prolonged time.  You develop blisters on and around your vagina.  You develop vaginal bleeding and it is not your menstrual period.  You develop abdominal pain.  You develop intestinal problems as mentioned above.  You get weak or lightheaded.  You have painful or increased urination.  You have pain during sexual intercourse. MAKE SURE YOU:   Understand these instructions.  Will watch your condition.  Will get help right away if you are not doing well or get worse. Document Released: 07/16/2004 Document Revised: 08/31/2011 Document Reviewed: 10/28/2009 [facility] Patient Information 2014 [employer].

## 2013-10-14 NOTE — ED Provider Notes (Signed)
CSN: [id number]     Arrival date & time 10/14/13  1537 History   First MD Initiated Contact with Patient 10/14/13 1543     Chief Complaint  Patient presents with  . Rash     (Consider location/radiation/quality/duration/timing/severity/associated sxs/prior Treatment) HPI Comments: Pt states that over the last couple of days she developed a rash under the right breast. Pt states that it was initially under both but the left side resolved. Pt states that when she had a shower this morning she had burning to the area so she thought she she come in to have it evaluated. Pt states that she has a problem with moisture under the area.  The history is provided by the patient. No language interpreter was used.    Past Medical History  Diagnosis Date  . Anemia, unspecified   . Extrinsic asthma, unspecified   . Chronic airway obstruction, not elsewhere classified   . Spondylosis of unspecified site without mention of myelopathy   . Dehydration   . Depressive disorder, not elsewhere classified   . Type II or unspecified type diabetes mellitus without mention of complication, not stated as uncontrolled   . Esophageal reflux   . Unspecified essential hypertension   . Unspecified menopausal and postmenopausal disorder   . Obesity, unspecified   . Inflammatory and toxic neuropathy, unspecified   . Unspecified sleep apnea   . Unspecified vitamin D deficiency   . Dysphagia 2007    historyof dysphagia with severe dysmotility by barium swallow-[name]  . Obesity   . Hypertension   . Diabetes mellitus without complication   . Allergic bronchitis   . Sleep apnea   . Gout 01/04/2013   Past Surgical History  Procedure Laterality Date  . Laser sugery      bilateral eyes  . Back surgery      fusion-multiple cervical spine levels-Dr. Arnoldo Morale  . Excision of abcess      ?? Chest  . Dilation and curettage of uterus    . Exploratory laparotomy    . Partial hysterectomy    . Total knee  arthroplasty  2005    Left  . Total knee arthroplasty  2005    Right- [name]  . Foot surgery      Right X 2  . Cholecystectomy    . Appendectomy    . Tonsillectomy    . Abdominal hysterectomy     Family History  Problem Relation Age of Onset  . Esophageal cancer Brother   . Esophageal cancer Sister     ?  . Colon polyps Brother   . Pancreatic cancer Sister     ?  . Diabetes Sister   . Diabetes      Aunt and Uncle  . Heart disease Maternal Grandfather    History  Substance Use Topics  . Smoking status: Former Smoker    Quit date: 06/22/1986  . Smokeless tobacco: Never Used  . Alcohol Use: No   OB History   Grav Para Term Preterm Abortions TAB SAB Ect Mult Living                 Review of Systems  Constitutional: Negative.   Respiratory: Negative.   Cardiovascular: Negative.       Allergies  Ace inhibitors; Penicillins; Tape; and Adhesive  Home Medications   Prior to Admission medications   Medication Sig Start Date End Date Taking? Authorizing Provider  albuterol (PROVENTIL) (2.5 MG/3ML) 0.083% nebulizer solution Take 3 mLs (  2.5 mg total) by nebulization every 6 (six) hours as needed. Respiratory distress 06/02/13  Yes Biagio Borg, MD  aspirin 81 MG tablet Take 81 mg by mouth daily.     Yes Historical Provider, MD  benzonatate (TESSALON PERLES) 100 MG capsule 1-2 tabs by mouth every 6 hrs as needed for cough 09/23/13  Yes Midge Minium, MD  cholecalciferol (VITAMIN D) 1000 UNITS tablet Take 2,000 Units by mouth daily.     Yes Historical Provider, MD  cyclobenzaprine (FLEXERIL) 5 MG tablet Take 5 mg by mouth 3 (three) times daily as needed for muscle spasms.   Yes Historical Provider, MD  diclofenac sodium (VOLTAREN) 1 % GEL Apply 4 g topically 4 (four) times daily. As needed to affected area 12/30/12  Yes Biagio Borg, MD  Fluticasone-Salmeterol (ADVAIR DISKUS) 500-50 MCG/DOSE AEPB Inhale 1 puff into the lungs 2 (two) times daily. 10/06/13  Yes Biagio Borg, MD  gabapentin (NEURONTIN) 800 MG tablet Take 1 tablet (800 mg total) by mouth 3 (three) times daily. 01/17/13  Yes Webb Silversmith, NP  Insulin Glargine (LANTUS) 100 UNIT/ML Solostar Pen Inject 250 Units into the skin every morning. And pen needles 2/day 07/27/13  Yes Renato Shin, MD  irbesartan (AVAPRO) 300 MG tablet Take 300 mg by mouth daily.   Yes Historical Provider, MD  metoprolol (LOPRESSOR) 50 MG tablet Take 75 mg by mouth 2 (two) times daily.   Yes Historical Provider, MD  omeprazole (PRILOSEC) 20 MG capsule Take 1 capsule (20 mg total) by mouth 2 (two) times daily. 01/16/13  Yes Biagio Borg, MD  oxybutynin (DITROPAN-XL) 10 MG 24 hr tablet Take 1 tablet (10 mg total) by mouth daily. 08/03/13  Yes Biagio Borg, MD  promethazine (PHENERGAN) 25 MG tablet TAKE 1 TABLET EVERY 6 HOURS AS NEEDED FOR NAUSEA   Yes Biagio Borg, MD  promethazine-dextromethorphan (PROMETHAZINE-DM) 6.25-15 MG/5ML syrup TAKE 1 TEASPOONFUL BY MOUTH FOUR TIMES DAILY AS NEEDED 10/13/13  Yes Biagio Borg, MD  glucose blood (ONE TOUCH ULTRA TEST) test strip Use to test blood sugars twice a day. Dx 250.63 10/06/13   Renato Shin, MD  Lancets [facility] ULTRASOFT) lancets Use to test blood sugars twice a day. 07/20/13   Renato Shin, MD  nystatin cream (MYCOSTATIN) Apply to affected area 2 times daily 10/14/13   Glendell Docker, NP   BP 126/69  Pulse 93  Temp(Src) 97.9 F (36.6 C) (Oral)  Resp 22  Wt 331 lb 7 oz (150.339 kg)  SpO2 98% Physical Exam  Nursing note reviewed. Constitutional: She is oriented to person, place, and time. She appears well-developed and well-nourished.  Cardiovascular: Normal rate and regular rhythm.   Pulmonary/Chest: Effort normal and breath sounds normal.  Musculoskeletal: Normal range of motion.  Neurological: She is alert and oriented to person, place, and time.  Skin:  Redness and moisture noted under the right breast. No vesicles noted.    ED Course  Procedures (including critical  care time) Labs Review Labs Reviewed - No data to display  Imaging Review No results found.   EKG Interpretation None      MDM   Final diagnoses:  Candidal skin infection   Considered shingles although not consistent with presentation. Will treat for candida    Glendell Docker, NP 10/14/13 1620

## 2013-10-15 NOTE — ED Provider Notes (Signed)
  Medical screening examination/treatment/procedure(s) were performed by non-physician practitioner and as supervising physician I was immediately available for consultation/collaboration.    Carmin Muskrat, MD 10/15/13 1304

## 2013-10-19 ENCOUNTER — Ambulatory Visit (INDEPENDENT_AMBULATORY_CARE_PROVIDER_SITE_OTHER): Payer: Medicare Other | Admitting: Podiatry

## 2013-10-19 ENCOUNTER — Telehealth: Payer: Self-pay | Admitting: *Deleted

## 2013-10-19 ENCOUNTER — Ambulatory Visit: Payer: Medicare Other | Admitting: Internal Medicine

## 2013-10-19 ENCOUNTER — Encounter: Payer: Self-pay | Admitting: Podiatry

## 2013-10-19 DIAGNOSIS — Z0289 Encounter for other administrative examinations: Secondary | ICD-10-CM

## 2013-10-19 DIAGNOSIS — E1159 Type 2 diabetes mellitus with other circulatory complications: Secondary | ICD-10-CM

## 2013-10-19 DIAGNOSIS — M2040 Other hammer toe(s) (acquired), unspecified foot: Secondary | ICD-10-CM

## 2013-10-19 NOTE — Telephone Encounter (Signed)
Pt advised that samples can not be given due to new rules concerning samples. Pt is coming of OV on 5/1 states she will discuss with MD during visit.

## 2013-10-19 NOTE — Progress Notes (Signed)
Dispensed diabetic shoes and 3 pairs of insoles with instructions for wearing.  

## 2013-10-20 ENCOUNTER — Encounter: Payer: Self-pay | Admitting: Endocrinology

## 2013-10-20 ENCOUNTER — Ambulatory Visit (INDEPENDENT_AMBULATORY_CARE_PROVIDER_SITE_OTHER): Payer: Medicare Other | Admitting: Endocrinology

## 2013-10-20 ENCOUNTER — Ambulatory Visit: Payer: Medicare Other | Admitting: Endocrinology

## 2013-10-20 VITALS — BP 112/76 | HR 111 | Temp 97.6°F | Ht 64.0 in | Wt 330.0 lb

## 2013-10-20 DIAGNOSIS — E1049 Type 1 diabetes mellitus with other diabetic neurological complication: Secondary | ICD-10-CM | POA: Diagnosis not present

## 2013-10-20 DIAGNOSIS — E1065 Type 1 diabetes mellitus with hyperglycemia: Principal | ICD-10-CM

## 2013-10-20 NOTE — Progress Notes (Signed)
Subjective:     Patient ID: Christy May, female   DOB: June 29, 1950, 63 y.o.   MRN: [id number]  HPI patient is found to have structural deformity with diabetes and needs diabetic shoes   Review of Systems     Objective:   Physical Exam No changes    Assessment:     At risk diabetic    Plan:     Shoes do not feel well and will be returned when we get a larger size

## 2013-10-20 NOTE — Patient Instructions (Addendum)
Please come back for a follow-up appointment in 2 months.   check your blood sugar twice a day.  vary the time of day when you check, between before the 3 meals, and at bedtime.  also check if you have symptoms of your blood sugar being too high or too low.  please keep a record of the readings and bring it to your next appointment here.  please call us sooner if your blood sugar goes below 70, or if you have a lot of readings over 200.   On this type of insulin schedule, you should eat meals on a regular schedule.  If a meal is missed or significantly delayed, your blood sugar could go low.   Please continue the same insulin for now.

## 2013-10-20 NOTE — Progress Notes (Signed)
Subjective:    Patient ID: Christy May, female    DOB: Aug 11, 1950, 63 y.o.   MRN: [id number]  HPI Pt returns for f/u of DM (dx'ed approx 1985, when she presented with vaginitis; she has mild if any neuropathy of the lower extremities; no associated chronic complications; she has been on insulin since 2002; in 2011, she was changed to a simpler qd insulin regimen, after poor results on multiple daily injections; she wants the cheapest possible insulin, but she did poorly on NPH; she has never had severe hypoglycemia or DKA).  no cbg record, but states cbg's vary from 83-300's.  There is no trend throughout the day.  Pt says she never misses the insulin.   Past Medical History  Diagnosis Date  . Anemia, unspecified   . Extrinsic asthma, unspecified   . Chronic airway obstruction, not elsewhere classified   . Spondylosis of unspecified site without mention of myelopathy   . Dehydration   . Depressive disorder, not elsewhere classified   . Type II or unspecified type diabetes mellitus without mention of complication, not stated as uncontrolled   . Esophageal reflux   . Unspecified essential hypertension   . Unspecified menopausal and postmenopausal disorder   . Obesity, unspecified   . Inflammatory and toxic neuropathy, unspecified   . Unspecified sleep apnea   . Unspecified vitamin D deficiency   . Dysphagia 2007    historyof dysphagia with severe dysmotility by barium swallow-[name]  . Obesity   . Hypertension   . Diabetes mellitus without complication   . Allergic bronchitis   . Sleep apnea   . Gout 01/04/2013    Past Surgical History  Procedure Laterality Date  . Laser sugery      bilateral eyes  . Back surgery      fusion-multiple cervical spine levels-Dr. Arnoldo Morale  . Excision of abcess      ?? Chest  . Dilation and curettage of uterus    . Exploratory laparotomy    . Partial hysterectomy    . Total knee arthroplasty  2005    Left  . Total knee arthroplasty   2005    Right- [name]  . Foot surgery      Right X 2  . Cholecystectomy    . Appendectomy    . Tonsillectomy    . Abdominal hysterectomy      History   Social History  . Marital Status: Widowed    Spouse Name: N/A    Number of Children: N/A  . Years of Education: N/A   Occupational History  . [employer] (former)    Social History Main Topics  . Smoking status: Former Smoker    Quit date: 06/22/1986  . Smokeless tobacco: Never Used  . Alcohol Use: No  . Drug Use: No  . Sexual Activity: Not on file   Other Topics Concern  . Not on file   Social History Narrative   ** Merged History Encounter **       Patient does not get regular exercise.      Widowed 2004      Disabled    Current Outpatient Prescriptions on File Prior to Visit  Medication Sig Dispense Refill  . albuterol (PROVENTIL) (2.5 MG/3ML) 0.083% nebulizer solution Take 3 mLs (2.5 mg total) by nebulization every 6 (six) hours as needed. Respiratory distress  75 mL  11  . aspirin 81 MG tablet Take 81 mg by mouth daily.        Marland Kitchen  benzonatate (TESSALON PERLES) 100 MG capsule 1-2 tabs by mouth every 6 hrs as needed for cough  60 capsule  1  . cholecalciferol (VITAMIN D) 1000 UNITS tablet Take 2,000 Units by mouth daily.        . cyclobenzaprine (FLEXERIL) 5 MG tablet Take 5 mg by mouth 3 (three) times daily as needed for muscle spasms.      . diclofenac sodium (VOLTAREN) 1 % GEL Apply 4 g topically 4 (four) times daily. As needed to affected area  100 g  5  . Fluticasone-Salmeterol (ADVAIR DISKUS) 500-50 MCG/DOSE AEPB Inhale 1 puff into the lungs 2 (two) times daily.  60 each  11  . gabapentin (NEURONTIN) 800 MG tablet Take 1 tablet (800 mg total) by mouth 3 (three) times daily.  270 tablet  3  . glucose blood (ONE TOUCH ULTRA TEST) test strip Use to test blood sugars twice a day. Dx 250.63  100 each  12  . Insulin Glargine (LANTUS) 100 UNIT/ML Solostar Pen Inject 250 Units into the skin every morning.  And pen needles 2/day      . irbesartan (AVAPRO) 300 MG tablet Take 300 mg by mouth daily.      . Lancets (ONETOUCH ULTRASOFT) lancets Use to test blood sugars twice a day.  100 each  12  . metoprolol (LOPRESSOR) 50 MG tablet Take 75 mg by mouth 2 (two) times daily.      Marland Kitchen nystatin cream (MYCOSTATIN) Apply to affected area 2 times daily  30 g  0  . omeprazole (PRILOSEC) 20 MG capsule Take 1 capsule (20 mg total) by mouth 2 (two) times daily.  180 capsule  3  . oxybutynin (DITROPAN-XL) 10 MG 24 hr tablet Take 1 tablet (10 mg total) by mouth daily.  90 tablet  3  . promethazine (PHENERGAN) 25 MG tablet TAKE 1 TABLET EVERY 6 HOURS AS NEEDED FOR NAUSEA  30 tablet  0  . promethazine-dextromethorphan (PROMETHAZINE-DM) 6.25-15 MG/5ML syrup TAKE 1 TEASPOONFUL BY MOUTH FOUR TIMES DAILY AS NEEDED  240 mL  0   No current facility-administered medications on file prior to visit.    Allergies  Allergen Reactions  . Ace Inhibitors Anaphylaxis  . Penicillins Hives and Rash  . Tape Hives    Paper tape is ok to use  . Adhesive [Tape] Rash    Family History  Problem Relation Age of Onset  . Esophageal cancer Brother   . Esophageal cancer Sister     ?  . Colon polyps Brother   . Pancreatic cancer Sister     ?  . Diabetes Sister   . Diabetes      Aunt and Uncle  . Heart disease Maternal Grandfather     BP 112/76  Pulse 111  Temp(Src) 97.6 F (36.4 C) (Oral)  Ht 5\' 4"  (1.626 m)  Wt 330 lb (149.687 kg)  BMI 56.62 kg/m2  SpO2 96%  Review of Systems She denies hypoglycemia and weight change.      Objective:   Physical Exam VITAL SIGNS:  See vs page.   GENERAL: no distress.   Morbid obesity.  In wheelchair.   PSYCH: Alert and well-oriented.  Does not appear anxious nor depressed.   Lab Results  Component Value Date   HGBA1C 10.1* 10/06/2013      Assessment & Plan:  DM: glycemic control is improved, but still not good. Morbid obesity.  This limits exercise rx of DM. Noncompliance:  this impairs the her chances  of achieving good glycemic control.  She continues to struggle with functional problems.  No cbg record, and she is late today.

## 2013-10-23 NOTE — Telephone Encounter (Signed)
Pt requesting rx for lantus to be sent to [facility] asap she is completely out

## 2013-10-24 MED ORDER — INSULIN GLARGINE 100 UNIT/ML SOLOSTAR PEN: 250.0000 [IU] | PEN_INJECTOR | SUBCUTANEOUS | Status: DC

## 2013-10-24 NOTE — Telephone Encounter (Signed)
Medication refill sent and patient notified.

## 2013-10-26 ENCOUNTER — Encounter: Payer: Self-pay | Admitting: Internal Medicine

## 2013-10-26 ENCOUNTER — Ambulatory Visit (INDEPENDENT_AMBULATORY_CARE_PROVIDER_SITE_OTHER): Payer: Medicare Other | Admitting: Internal Medicine

## 2013-10-26 VITALS — BP 142/82 | HR 108 | Temp 97.4°F | Ht 64.0 in | Wt 328.1 lb

## 2013-10-26 DIAGNOSIS — J441 Chronic obstructive pulmonary disease with (acute) exacerbation: Secondary | ICD-10-CM

## 2013-10-26 DIAGNOSIS — J309 Allergic rhinitis, unspecified: Secondary | ICD-10-CM | POA: Diagnosis not present

## 2013-10-26 DIAGNOSIS — I10 Essential (primary) hypertension: Secondary | ICD-10-CM

## 2013-10-26 DIAGNOSIS — E669 Obesity, unspecified: Secondary | ICD-10-CM

## 2013-10-26 DIAGNOSIS — J302 Other seasonal allergic rhinitis: Secondary | ICD-10-CM

## 2013-10-26 MED ORDER — METHYLPREDNISOLONE ACETATE 80 MG/ML IJ SUSP
120.0000 mg | Freq: Once | INTRAMUSCULAR | Status: AC
Start: 2013-10-26 — End: 2013-10-26
  Administered 2013-10-26: 120 mg via INTRAMUSCULAR

## 2013-10-26 MED ORDER — PREDNISONE 10 MG PO TABS: ORAL_TABLET | ORAL | Status: DC

## 2013-10-26 MED ORDER — BECLOMETHASONE DIPROPIONATE 80 MCG/ACT IN AERS: 2.0000 | INHALATION_SPRAY | Freq: Two times a day (BID) | RESPIRATORY_TRACT | Status: DC

## 2013-10-26 NOTE — Progress Notes (Signed)
Pre visit review using our clinic review tool, if applicable. No additional management support is needed unless otherwise documented below in the visit note. 

## 2013-10-26 NOTE — Progress Notes (Signed)
Subjective:    Patient ID: Christy May, female    DOB: 25-Aug-1950, 63 y.o.   MRN: [id number]  HPI  Here to f/u, c/o 1 wk mild worsening wheezing and chest tightness, better initially with change advair 100/50 to 500/50 sample, then resumed 100/50 again, had declined other steroid tx recently due to fear of wt gain.  Pt denies chest pain, increased sob or doe, wheezing, orthopnea, PND, increased LE swelling, palpitations, dizziness or syncope except for the above..Pt denies new neurological symptoms such as new headache, or facial or extremity weakness or numbness   Pt denies polydipsia, polyuria, Asks for Bariatric surgury referral at [facility].   Pt denies fever, wt loss, night sweats, loss of appetite, or other constitutional symptoms  Denies worsening depressive symptoms, suicidal ideation, or panic; has ongoing anxiety Past Medical History  Diagnosis Date  . Anemia, unspecified   . Extrinsic asthma, unspecified   . Chronic airway obstruction, not elsewhere classified   . Spondylosis of unspecified site without mention of myelopathy   . Dehydration   . Depressive disorder, not elsewhere classified   . Type II or unspecified type diabetes mellitus without mention of complication, not stated as uncontrolled   . Esophageal reflux   . Unspecified essential hypertension   . Unspecified menopausal and postmenopausal disorder   . Obesity, unspecified   . Inflammatory and toxic neuropathy, unspecified   . Unspecified sleep apnea   . Unspecified vitamin D deficiency   . Dysphagia 2007    historyof dysphagia with severe dysmotility by barium swallow-[name]  . Obesity   . Hypertension   . Diabetes mellitus without complication   . Allergic bronchitis   . Sleep apnea   . Gout 01/04/2013   Past Surgical History  Procedure Laterality Date  . Laser sugery      bilateral eyes  . Back surgery      fusion-multiple cervical spine levels-Dr. Arnoldo Morale  . Excision of abcess      ?? Chest  .  Dilation and curettage of uterus    . Exploratory laparotomy    . Partial hysterectomy    . Total knee arthroplasty  2005    Left  . Total knee arthroplasty  2005    Right- [name]  . Foot surgery      Right X 2  . Cholecystectomy    . Appendectomy    . Tonsillectomy    . Abdominal hysterectomy      reports that she quit smoking about 27 years ago. She has never used smokeless tobacco. She reports that she does not drink alcohol or use illicit drugs. family history includes Colon polyps in her brother; Diabetes in her sister and another family member; Esophageal cancer in her brother and sister; Heart disease in her maternal grandfather; Pancreatic cancer in her sister. Allergies  Allergen Reactions  . Ace Inhibitors Anaphylaxis  . Penicillins Hives and Rash  . Tape Hives    Paper tape is ok to use  . Adhesive [Tape] Rash   Current Outpatient Prescriptions on File Prior to Visit  Medication Sig Dispense Refill  . albuterol (PROVENTIL) (2.5 MG/3ML) 0.083% nebulizer solution Take 3 mLs (2.5 mg total) by nebulization every 6 (six) hours as needed. Respiratory distress  75 mL  11  . aspirin 81 MG tablet Take 81 mg by mouth daily.        . benzonatate (TESSALON PERLES) 100 MG capsule 1-2 tabs by mouth every 6 hrs as needed  for cough  60 capsule  1  . cholecalciferol (VITAMIN D) 1000 UNITS tablet Take 2,000 Units by mouth daily.        . cyclobenzaprine (FLEXERIL) 5 MG tablet Take 5 mg by mouth 3 (three) times daily as needed for muscle spasms.      . diclofenac sodium (VOLTAREN) 1 % GEL Apply 4 g topically 4 (four) times daily. As needed to affected area  100 g  5  . gabapentin (NEURONTIN) 800 MG tablet Take 1 tablet (800 mg total) by mouth 3 (three) times daily.  270 tablet  3  . glucose blood (ONE TOUCH ULTRA TEST) test strip Use to test blood sugars twice a day. Dx 250.63  100 each  12  . Insulin Glargine (LANTUS) 100 UNIT/ML Solostar Pen Inject 250 Units into the skin every  morning. And pen needles 2/day  25 pen  2  . irbesartan (AVAPRO) 300 MG tablet Take 300 mg by mouth daily.      . Lancets (ONETOUCH ULTRASOFT) lancets Use to test blood sugars twice a day.  100 each  12  . metoprolol (LOPRESSOR) 50 MG tablet Take 75 mg by mouth 2 (two) times daily.      Marland Kitchen nystatin cream (MYCOSTATIN) Apply to affected area 2 times daily  30 g  0  . omeprazole (PRILOSEC) 20 MG capsule Take 1 capsule (20 mg total) by mouth 2 (two) times daily.  180 capsule  3  . oxybutynin (DITROPAN-XL) 10 MG 24 hr tablet Take 1 tablet (10 mg total) by mouth daily.  90 tablet  3  . promethazine (PHENERGAN) 25 MG tablet TAKE 1 TABLET EVERY 6 HOURS AS NEEDED FOR NAUSEA  30 tablet  0  . promethazine-dextromethorphan (PROMETHAZINE-DM) 6.25-15 MG/5ML syrup TAKE 1 TEASPOONFUL BY MOUTH FOUR TIMES DAILY AS NEEDED  240 mL  0   No current facility-administered medications on file prior to visit.   Review of Systems  Constitutional: Negative for unusual diaphoresis or other sweats  HENT: Negative for ringing in ear Eyes: Negative for double vision or worsening visual disturbance.  Respiratory: Negative for choking and stridor.   Gastrointestinal: Negative for vomiting or other signifcant bowel change Genitourinary: Negative for hematuria or decreased urine volume.  Musculoskeletal: Negative for other MSK pain or swelling Skin: Negative for color change and worsening wound.  Neurological: Negative for tremors and numbness other than noted  Psychiatric/Behavioral: Negative for decreased concentration or agitation other than above       Objective:   Physical Exam BP 142/82  Pulse 108  Temp(Src) 97.4 F (36.3 C) (Oral)  Ht 5\' 4"  (1.626 m)  Wt 328 lb 2 oz (148.837 kg)  BMI 56.30 kg/m2  SpO2 97% VS noted,  Constitutional: Pt appears well-developed, well-nourished.  HENT: Head: NCAT.  Right Ear: External ear normal.  Left Ear: External ear normal.  Eyes: . Pupils are equal, round, and reactive to  light. Conjunctivae and EOM are normal Neck: Normal range of motion. Neck supple.  Cardiovascular: Normal rate and regular rhythm.   Pulmonary/Chest: Effort normal and breath sounds decreased with bilat wheezes.  Abd:  Soft, NT, ND, + BS Neurological: Pt is alert. Not confused , motor grossly intact Skin: Skin is warm. No rash Psychiatric: Pt behavior is normal. No agitation. mild nervous, not depressed affect    Assessment & Plan:

## 2013-10-26 NOTE — Patient Instructions (Signed)
You had the steroid shot today  OK to stop the advair  Please take all new medication as prescribed - the sample of Qvar at 2 puffs twice per day until feeling better, then 1 puff twice per day after that (and a prescription given today as well)  Please take all new medication as prescribed- the prednisone  Please continue all other medications as before, and refills have been done if requested. Please have the pharmacy call with any other refills you may need.  Please continue your efforts at being more active, low cholesterol diet, and weight control.  You will be contacted regarding the referral for: [place]

## 2013-10-27 ENCOUNTER — Telehealth: Payer: Self-pay | Admitting: Endocrinology

## 2013-10-27 NOTE — Telephone Encounter (Signed)
It is very possible that your blood sugar is going low, but it is essential to check, to be sure, for your safety.

## 2013-10-27 NOTE — Telephone Encounter (Signed)
Pt informed to start checking blood sugars on a regular basis so that medication could be changed in a safe manner.

## 2013-10-27 NOTE — Telephone Encounter (Signed)
Patient been having diabetic episodes which were low She states she has had 2 episodes   Call back: [phone]  Thank you :)

## 2013-10-27 NOTE — Telephone Encounter (Signed)
Called pt and she states that she has not taken her Blood sugar but  states she can feel when her blood sugar and the last two times when it has dropped its been bad. Please advise, Thanks!

## 2013-10-28 NOTE — Assessment & Plan Note (Signed)
stable overall by history and exam, recent data reviewed with pt, and pt to continue medical treatment as before,  to f/u any worsening symptoms or concerns BP Readings from Last 3 Encounters:  10/26/13 142/82  10/20/13 112/76  10/14/13 134/41

## 2013-10-28 NOTE — Assessment & Plan Note (Signed)
Christy May for referral bariatric, [facility]

## 2013-10-28 NOTE — Assessment & Plan Note (Signed)
Mild to mod, for depomedrol IM, predpac asd, change advair to qvar 80 for hopeful better ongoing control,  to f/u any worsening symptoms or concerns

## 2013-10-30 ENCOUNTER — Telehealth: Payer: Self-pay | Admitting: *Deleted

## 2013-10-30 ENCOUNTER — Other Ambulatory Visit: Payer: Self-pay | Admitting: Internal Medicine

## 2013-10-30 MED ORDER — AZITHROMYCIN 250 MG PO TABS: ORAL_TABLET | ORAL | Status: DC

## 2013-10-30 NOTE — Telephone Encounter (Signed)
Ok for zpack - done

## 2013-10-30 NOTE — Telephone Encounter (Signed)
Pt called states she is still having same symptoms.  Further states she has had no relief with the injection.  Please advise

## 2013-10-31 ENCOUNTER — Other Ambulatory Visit: Payer: Self-pay | Admitting: Family Medicine

## 2013-10-31 NOTE — Telephone Encounter (Signed)
Patient informed. 

## 2013-11-03 DIAGNOSIS — E11311 Type 2 diabetes mellitus with unspecified diabetic retinopathy with macular edema: Secondary | ICD-10-CM | POA: Diagnosis not present

## 2013-11-03 DIAGNOSIS — E11329 Type 2 diabetes mellitus with mild nonproliferative diabetic retinopathy without macular edema: Secondary | ICD-10-CM | POA: Diagnosis not present

## 2013-11-03 DIAGNOSIS — E1139 Type 2 diabetes mellitus with other diabetic ophthalmic complication: Secondary | ICD-10-CM | POA: Diagnosis not present

## 2013-11-03 DIAGNOSIS — E11339 Type 2 diabetes mellitus with moderate nonproliferative diabetic retinopathy without macular edema: Secondary | ICD-10-CM | POA: Diagnosis not present

## 2013-11-06 ENCOUNTER — Other Ambulatory Visit: Payer: Self-pay | Admitting: Internal Medicine

## 2013-11-06 DIAGNOSIS — Z1231 Encounter for screening mammogram for malignant neoplasm of breast: Secondary | ICD-10-CM

## 2013-11-14 ENCOUNTER — Ambulatory Visit (HOSPITAL_COMMUNITY)
Admission: RE | Admit: 2013-11-14 | Discharge: 2013-11-14 | Disposition: A | Discharge status: Home / Self Care | Payer: Medicare Other | Source: Ambulatory Visit | Attending: Internal Medicine | Admitting: Internal Medicine

## 2013-11-14 DIAGNOSIS — Z1231 Encounter for screening mammogram for malignant neoplasm of breast: Secondary | ICD-10-CM

## 2013-11-16 DIAGNOSIS — R32 Unspecified urinary incontinence: Secondary | ICD-10-CM | POA: Diagnosis not present

## 2013-11-16 DIAGNOSIS — N3642 Intrinsic sphincter deficiency (ISD): Secondary | ICD-10-CM | POA: Diagnosis not present

## 2013-11-21 DIAGNOSIS — M6281 Muscle weakness (generalized): Secondary | ICD-10-CM | POA: Diagnosis not present

## 2013-11-21 DIAGNOSIS — R279 Unspecified lack of coordination: Secondary | ICD-10-CM | POA: Diagnosis not present

## 2013-11-21 DIAGNOSIS — R351 Nocturia: Secondary | ICD-10-CM | POA: Diagnosis not present

## 2013-11-21 DIAGNOSIS — R32 Unspecified urinary incontinence: Secondary | ICD-10-CM | POA: Diagnosis not present

## 2013-12-01 ENCOUNTER — Other Ambulatory Visit: Payer: Self-pay | Admitting: Internal Medicine

## 2013-12-05 DIAGNOSIS — E1139 Type 2 diabetes mellitus with other diabetic ophthalmic complication: Secondary | ICD-10-CM | POA: Diagnosis not present

## 2013-12-05 DIAGNOSIS — H2589 Other age-related cataract: Secondary | ICD-10-CM | POA: Diagnosis not present

## 2013-12-05 DIAGNOSIS — E11319 Type 2 diabetes mellitus with unspecified diabetic retinopathy without macular edema: Secondary | ICD-10-CM | POA: Diagnosis not present

## 2013-12-06 ENCOUNTER — Other Ambulatory Visit: Payer: Self-pay | Admitting: Internal Medicine

## 2013-12-06 ENCOUNTER — Encounter: Payer: Self-pay | Admitting: Internal Medicine

## 2013-12-06 ENCOUNTER — Ambulatory Visit (INDEPENDENT_AMBULATORY_CARE_PROVIDER_SITE_OTHER): Payer: Medicare Other | Admitting: Internal Medicine

## 2013-12-06 VITALS — BP 142/82 | HR 101 | Temp 98.5°F | Wt 331.5 lb

## 2013-12-06 DIAGNOSIS — E1165 Type 2 diabetes mellitus with hyperglycemia: Secondary | ICD-10-CM

## 2013-12-06 DIAGNOSIS — J449 Chronic obstructive pulmonary disease, unspecified: Secondary | ICD-10-CM

## 2013-12-06 DIAGNOSIS — E1049 Type 1 diabetes mellitus with other diabetic neurological complication: Secondary | ICD-10-CM

## 2013-12-06 DIAGNOSIS — R05 Cough: Secondary | ICD-10-CM

## 2013-12-06 DIAGNOSIS — R059 Cough, unspecified: Secondary | ICD-10-CM

## 2013-12-06 DIAGNOSIS — IMO0001 Reserved for inherently not codable concepts without codable children: Secondary | ICD-10-CM

## 2013-12-06 DIAGNOSIS — I10 Essential (primary) hypertension: Secondary | ICD-10-CM

## 2013-12-06 DIAGNOSIS — R062 Wheezing: Secondary | ICD-10-CM | POA: Diagnosis not present

## 2013-12-06 DIAGNOSIS — K219 Gastro-esophageal reflux disease without esophagitis: Secondary | ICD-10-CM

## 2013-12-06 DIAGNOSIS — E1065 Type 1 diabetes mellitus with hyperglycemia: Secondary | ICD-10-CM

## 2013-12-06 MED ORDER — NYSTATIN 100000 UNIT/GM EX CREA: TOPICAL_CREAM | CUTANEOUS | Status: DC

## 2013-12-06 MED ORDER — HYDROCODONE-HOMATROPINE 5-1.5 MG/5ML PO SYRP: 5.0000 mL | ORAL_SOLUTION | Freq: Four times a day (QID) | ORAL | Status: DC | PRN

## 2013-12-06 MED ORDER — METHYLPREDNISOLONE ACETATE 80 MG/ML IJ SUSP
80.0000 mg | Freq: Once | INTRAMUSCULAR | Status: AC
  Administered 2013-12-06: 80 mg via INTRAMUSCULAR

## 2013-12-06 MED ORDER — MONTELUKAST SODIUM 10 MG PO TABS: 10.0000 mg | ORAL_TABLET | Freq: Every day | ORAL | Status: DC

## 2013-12-06 NOTE — Assessment & Plan Note (Signed)
stable overall by history and exam, recent data reviewed with pt, and pt to continue medical treatment as before,  to f/u any worsening symptoms or concerns BP Readings from Last 3 Encounters:  12/06/13 142/82  10/26/13 142/82  10/20/13 112/76

## 2013-12-06 NOTE — Assessment & Plan Note (Signed)
?   Etiology, most likely related to allergies/post nasal gtt, to add singulair 10 qd, depomedrol IMx 1, delcines other med change, for pulm referral but informed her I think she might consider cancel if cough clears, hold on cxr for now but consider for persistent or worsening symptoms

## 2013-12-06 NOTE — Progress Notes (Signed)
Pre visit review using our clinic review tool, if applicable. No additional management support is needed unless otherwise documented below in the visit note. 

## 2013-12-06 NOTE — Assessment & Plan Note (Signed)
stable overall by history and exam, recent data reviewed with pt, and pt to continue medical treatment as before,  to f/u any worsening symptoms or concerns SpO2 Readings from Last 3 Encounters:  12/06/13 98%  10/26/13 97%  10/20/13 96%

## 2013-12-06 NOTE — Assessment & Plan Note (Signed)
[place] for dietary referral - DM

## 2013-12-06 NOTE — Patient Instructions (Signed)
Please take all new medication as prescribed - the generic singulair 10 mg per day  Please continue all other medications as before, and refills have been done if requested - the cough medicine  You will be contacted regarding the referral for: pulmonary, and dietary  Please have the pharmacy call with any other refills you may need.  Please continue your efforts at being more active, low cholesterol diet, and weight control.

## 2013-12-06 NOTE — Assessment & Plan Note (Signed)
To cont PPI tx,  to f/u any worsening symptoms or concerns

## 2013-12-06 NOTE — Progress Notes (Signed)
   Subjective:    Patient ID: Christy May, female    DOB: 1951-05-02, 63 y.o.   MRN: [id number]  HPI  Here to f/u, with ongoing cough x 4 mo, nonprod for most part but sometimes with whitish , no blood, yellow or green.  Quit tobacco some time ago, wondering if this could be ongoing smoker cough somehow.  Cough mult times per day, sometimes severe, assoc with bladder incont, working on [employer] type excercises. cant sleep well due to cough, Hycodan works well. Has seen urology, has some mild bladder prolapse but no surgury needed per Dr Janice Norrie.  Zpack last wk no help. Asks for pulm referral.  Has ongoing sinus allergy symptoms as well.  Also cont's unable to lose signficant wt, asks for Dm dietary referral..  Has seen [facility] and informed of choices but does not want Roux-N-Y or lap band, and not eligible for sleeve due to GERD.  Last cxr neg jan 2015 Pt denies chest pain, increased sob or doe, wheezing, orthopnea, PND, increased LE swelling, palpitations, dizziness or syncope.  Past Medical History  Diagnosis Date  . Anemia, unspecified   . Extrinsic asthma, unspecified   . Chronic airway obstruction, not elsewhere classified   . Spondylosis of unspecified site without mention of myelopathy   . Dehydration   . Depressive disorder, not elsewhere classified   . Type II or unspecified type diabetes mellitus without mention of complication, not stated as uncontrolled   . Esophageal reflux   . Unspecified essential hypertension   . Unspecified menopausal and postmenopausal disorder   . Obesity, unspecified   . Inflammatory and toxic neuropathy, unspecified   . Unspecified sleep apnea   . Unspecified vitamin D deficiency   . Dysphagia 2007    historyof dysphagia with severe dysmotility by barium swallow-[name]  . Obesity   . Hypertension   . Diabetes mellitus without complication   . Allergic bronchitis   . Sleep apnea   . Gout 01/04/2013   Past Surgical History  Procedure  Laterality Date  . Laser sugery      bilateral eyes  . Back surgery      fusion-multiple cervical spine levels-Dr. Arnoldo Morale  . Excision of abcess      ?? Chest  . Dilation and curettage of uterus    . Exploratory laparotomy    . Partial hysterectomy    . Total knee arthroplasty  2005    Left  . Total knee arthroplasty  2005    Right- [name]  . Foot surgery      Right X 2  . Cholecystectomy    . Appendectomy    . Tonsillectomy    . Abdominal hysterectomy      reports that she quit smoking about 27 years ago. She has never used smokeless tobacco. She reports that she does not drink alcohol or use illicit drugs. family history includes Colon polyps in her brother; Diabetes in her sister and another family member; Esophageal cancer in her brother and sister; Heart disease in her maternal grandfather; Pancreatic cancer in her sister. Allergies  Allergen Reactions  . Ace Inhibitors Anaphylaxis  . Penicillins Hives and Rash  . Tape Hives    Paper tape is ok to use  . Adhesive [Tape] Rash           Review of Systems     Objective:   Physical Exam        Assessment & Plan:

## 2013-12-07 ENCOUNTER — Encounter: Payer: Self-pay | Admitting: Podiatry

## 2013-12-07 ENCOUNTER — Ambulatory Visit (INDEPENDENT_AMBULATORY_CARE_PROVIDER_SITE_OTHER): Payer: Medicare Other | Admitting: Podiatry

## 2013-12-07 VITALS — BP 148/69 | HR 69 | Resp 12

## 2013-12-07 DIAGNOSIS — M79609 Pain in unspecified limb: Secondary | ICD-10-CM

## 2013-12-07 DIAGNOSIS — B351 Tinea unguium: Secondary | ICD-10-CM | POA: Diagnosis not present

## 2013-12-07 DIAGNOSIS — M79673 Pain in unspecified foot: Secondary | ICD-10-CM

## 2013-12-07 NOTE — Progress Notes (Signed)
Subjective:     Patient ID: Christy May, female   DOB: Sep 13, 1950, 63 y.o.   MRN: [id number]  HPI obese female with thick nailbeds 1-5 both feet that are yellow brittle and painful when pressed   Review of Systems     Objective:   Physical Exam Neurovascular status unchanged with thick yellow brittle nailbeds that are painful and she cannot cut    Assessment:     Mycotic nail infection with pain 1-5 of both feet    Plan:     Debris painful nailbeds 1-5 both feet with no iatrogenic bleeding noted

## 2013-12-20 ENCOUNTER — Encounter: Payer: Self-pay | Admitting: Endocrinology

## 2013-12-20 ENCOUNTER — Ambulatory Visit (INDEPENDENT_AMBULATORY_CARE_PROVIDER_SITE_OTHER): Payer: Medicare Other | Admitting: Endocrinology

## 2013-12-20 VITALS — BP 122/70 | HR 93 | Temp 98.5°F | Ht 64.0 in | Wt 328.0 lb

## 2013-12-20 DIAGNOSIS — E1065 Type 1 diabetes mellitus with hyperglycemia: Secondary | ICD-10-CM | POA: Diagnosis not present

## 2013-12-20 DIAGNOSIS — E1049 Type 1 diabetes mellitus with other diabetic neurological complication: Secondary | ICD-10-CM

## 2013-12-20 LAB — HEMOGLOBIN A1C: Hgb A1c MFr Bld: 9.1 % — ABNORMAL HIGH (ref 4.6–6.5)

## 2013-12-20 NOTE — Patient Instructions (Addendum)
Please come back for a follow-up appointment in 3 months.   blood tests are being requested for you today.  We'll contact you with results. check your blood sugar twice a day.  vary the time of day when you check, between before the 3 meals, and at bedtime.  also check if you have symptoms of your blood sugar being too high or too low.  please keep a record of the readings and bring it to your next appointment here.  please call us sooner if your blood sugar goes below 70, or if you have a lot of readings over 200.   On this type of insulin schedule, you should eat meals on a regular schedule.  If a meal is missed or significantly delayed, your blood sugar could go low.

## 2013-12-20 NOTE — Progress Notes (Signed)
Subjective:    Patient ID: Christy May, female    DOB: [birth year], 63 y.o.   MRN: [id number]  HPI Pt returns for f/u of DM (dx'ed approx 1985, when she presented with vaginitis; she has mild if any neuropathy of the lower extremities; no associated chronic complications; she has been on insulin since 2002; in 2011, she was changed to a simpler qd insulin regimen, after poor results on multiple daily injections; she wants the cheapest possible insulin, but she did poorly on NPH; she has never had pancreatitis, severe hypoglycemia or DKA).  no cbg record, but states cbg's vary from 53-300's.  It is mildly low, when a meal is missed or delayed.  She says she has had a steroid injection recently, and this was the cause of the cbg of 300.  It is in general higher as the day goes on.   Past Medical History  Diagnosis Date  . Anemia, unspecified   . Extrinsic asthma, unspecified   . Chronic airway obstruction, not elsewhere classified   . Spondylosis of unspecified site without mention of myelopathy   . Dehydration   . Depressive disorder, not elsewhere classified   . Type II or unspecified type diabetes mellitus without mention of complication, not stated as uncontrolled   . Esophageal reflux   . Unspecified essential hypertension   . Unspecified menopausal and postmenopausal disorder   . Obesity, unspecified   . Inflammatory and toxic neuropathy, unspecified   . Unspecified sleep apnea   . Unspecified vitamin D deficiency   . Dysphagia 2007    historyof dysphagia with severe dysmotility by barium swallow-[name]  . Obesity   . Hypertension   . Diabetes mellitus without complication   . Allergic bronchitis   . Sleep apnea   . Gout 01/04/2013    Past Surgical History  Procedure Laterality Date  . Laser sugery      bilateral eyes  . Back surgery      fusion-multiple cervical spine levels-Dr. Arnoldo Morale  . Excision of abcess      ?? Chest  . Dilation and curettage of uterus      . Exploratory laparotomy    . Partial hysterectomy    . Total knee arthroplasty  2005    Left  . Total knee arthroplasty  2005    Right- [name]  . Foot surgery      Right X 2  . Cholecystectomy    . Appendectomy    . Tonsillectomy    . Abdominal hysterectomy      History   Social History  . Marital Status: Widowed    Spouse Name: N/A    Number of Children: N/A  . Years of Education: N/A   Occupational History  . [employer] (former)    Social History Main Topics  . Smoking status: Former Smoker    Quit date: 06/22/1986  . Smokeless tobacco: Never Used  . Alcohol Use: No  . Drug Use: No  . Sexual Activity: Not on file   Other Topics Concern  . Not on file   Social History Narrative   ** Merged History Encounter **       Patient does not get regular exercise.      Widowed 2004      Disabled    Current Outpatient Prescriptions on File Prior to Visit  Medication Sig Dispense Refill  . albuterol (PROVENTIL) (2.5 MG/3ML) 0.083% nebulizer solution Take 3 mLs (2.5 mg total) by nebulization  every 6 (six) hours as needed. Respiratory distress  75 mL  11  . aspirin 81 MG tablet Take 81 mg by mouth daily.        . beclomethasone (QVAR) 80 MCG/ACT inhaler Inhale 2 puffs into the lungs 2 (two) times daily.  1 Inhaler  12  . benzonatate (TESSALON) 100 MG capsule TAKE 1 TO 2 CAPSULES BY MOUTH EVERY 6 HOURS AS NEEDED FOR COUGH  60 capsule  0  . cholecalciferol (VITAMIN D) 1000 UNITS tablet Take 2,000 Units by mouth daily.        . cyclobenzaprine (FLEXERIL) 5 MG tablet Take 5 mg by mouth 3 (three) times daily as needed for muscle spasms.      . cyclobenzaprine (FLEXERIL) 5 MG tablet TAKE 1 TABLET BY MOUTH THREE TIMES DAILY AS NEEDED FOR MUSCLE SPASMS  90 tablet  0  . diclofenac sodium (VOLTAREN) 1 % GEL Apply 4 g topically 4 (four) times daily. As needed to affected area  100 g  5  . gabapentin (NEURONTIN) 800 MG tablet Take 1 tablet (800 mg total) by mouth 3 (three)  times daily.  270 tablet  3  . glucose blood (ONE TOUCH ULTRA TEST) test strip Use to test blood sugars twice a day. Dx 250.63  100 each  12  . HYDROcodone-homatropine (HYCODAN) 5-1.5 MG/5ML syrup Take 5 mLs by mouth every 6 (six) hours as needed for cough.  180 mL  0  . Insulin Glargine (LANTUS) 100 UNIT/ML Solostar Pen Inject 250 Units into the skin every morning. And pen needles 2/day  25 pen  2  . irbesartan (AVAPRO) 300 MG tablet Take 300 mg by mouth daily.      . Lancets (ONETOUCH ULTRASOFT) lancets Use to test blood sugars twice a day.  100 each  12  . metoprolol (LOPRESSOR) 50 MG tablet Take 75 mg by mouth 2 (two) times daily.      . metoprolol (LOPRESSOR) 50 MG tablet TAKE 1& 1/2 TABLETS TWICE DAILY  270 tablet  3  . montelukast (SINGULAIR) 10 MG tablet Take 1 tablet (10 mg total) by mouth daily.  30 tablet  11  . nystatin cream (MYCOSTATIN) Apply to affected area 2 times daily  30 g  0  . omeprazole (PRILOSEC) 20 MG capsule Take 1 capsule (20 mg total) by mouth 2 (two) times daily.  180 capsule  3  . oxybutynin (DITROPAN-XL) 10 MG 24 hr tablet Take 1 tablet (10 mg total) by mouth daily.  90 tablet  3  . promethazine (PHENERGAN) 25 MG tablet TAKE 1 TABLET BY MOUTH EVERY 6 HOURS AS NEEDED FOR NAUSEA  30 tablet  0  . promethazine-dextromethorphan (PROMETHAZINE-DM) 6.25-15 MG/5ML syrup TAKE 1 TEASPOONFUL BY MOUTH FOUR TIMES DAILY AS NEEDED  240 mL  0   No current facility-administered medications on file prior to visit.    Allergies  Allergen Reactions  . Ace Inhibitors Anaphylaxis  . Penicillins Hives and Rash  . Tape Hives    Paper tape is ok to use  . Adhesive [Tape] Rash    Family History  Problem Relation Age of Onset  . Esophageal cancer Brother   . Esophageal cancer Sister     ?  . Colon polyps Brother   . Pancreatic cancer Sister     ?  . Diabetes Sister   . Diabetes      Aunt and Uncle  . Heart disease Maternal Grandfather     BP 122/70  Pulse 93  Temp(Src)  98.5 F (36.9 C) (Oral)  Ht 5\' 4"  (1.626 m)  Wt 328 lb (148.78 kg)  BMI 56.27 kg/m2  SpO2 95%    Review of Systems Denies LOC and weight change.      Objective:   Physical Exam VITAL SIGNS:  See vs page GENERAL: no distress Pulses: dorsalis pedis intact bilat.  Feet: no deformity. feet are of normal color and temp. 1+ bilat leg edema  Skin: no ulcer on the feet.  Neuro: sensation is intact to touch on the feet, but decreased from normal   Lab Results  Component Value Date   HGBA1C 9.1* 12/20/2013       Assessment & Plan:  DM: moderate exacerbation:  Noncompliance with cbg recording, persistent: I'll work around this as best I can.  i need more cbg info in order to safely adjust insulin.   Morbid obesity.  This complicates the rx of DM.  This impairs the ability to achieve glycemic control.  I'll work around this as best I can   Patient is advised the following: Patient Instructions  Please come back for a follow-up appointment in 3 months.   blood tests are being requested for you today.  We'll contact you with results. check your blood sugar twice a day.  vary the time of day when you check, between before the 3 meals, and at bedtime.  also check if you have symptoms of your blood sugar being too high or too low.  please keep a record of the readings and bring it to your next appointment here.  please call us sooner if your blood sugar goes below 70, or if you have a lot of readings over 200.   On this type of insulin schedule, you should eat meals on a regular schedule.  If a meal is missed or significantly delayed, your blood sugar could go low.     We need more information about how and why your blood sugar varies. Please write it down, and bring to your next appointment. Please feel free to make comments on that paper

## 2013-12-22 ENCOUNTER — Emergency Department (INDEPENDENT_AMBULATORY_CARE_PROVIDER_SITE_OTHER): Payer: Medicare Other

## 2013-12-22 ENCOUNTER — Encounter (HOSPITAL_COMMUNITY): Payer: Self-pay | Admitting: Emergency Medicine

## 2013-12-22 ENCOUNTER — Emergency Department (INDEPENDENT_AMBULATORY_CARE_PROVIDER_SITE_OTHER)
Admission: EM | Admit: 2013-12-22 | Discharge: 2013-12-22 | Disposition: A | Discharge status: Home / Self Care | Payer: Medicare Other | Source: Home / Self Care

## 2013-12-22 DIAGNOSIS — J449 Chronic obstructive pulmonary disease, unspecified: Secondary | ICD-10-CM

## 2013-12-22 DIAGNOSIS — R05 Cough: Secondary | ICD-10-CM

## 2013-12-22 DIAGNOSIS — R059 Cough, unspecified: Secondary | ICD-10-CM

## 2013-12-22 DIAGNOSIS — R0982 Postnasal drip: Secondary | ICD-10-CM

## 2013-12-22 MED ORDER — HYDROCOD POLST-CHLORPHEN POLST 10-8 MG/5ML PO LQCR: 5.0000 mL | Freq: Two times a day (BID) | ORAL | Status: DC | PRN

## 2013-12-22 NOTE — ED Provider Notes (Signed)
CSN: [id number]     Arrival date & time 12/22/13  1831 History   First MD Initiated Contact with Patient 12/22/13 1900     Chief Complaint  Patient presents with  . Cough   (Consider location/radiation/quality/duration/timing/severity/associated sxs/prior Treatment) HPI Comments: 63 year old severely and morbidly obese female complaining of cough for greater than 4 months. She has a history of asthma, COPD, type II or unspecified type diabetes, esophageal reflux, essential hypertension, sleep apnea, and dysphasia, hypertension and allergic bronchitis. She was evaluated for cough by her PCP at [facility] practice earlier this month. She has been treated with Hycodan cough medicine, albuterol, Qvar, steroids, Singulair and PPI. She continues to cough. It is worse during the night in supine that he is consistent through the day. The cough is primarily dry with some frothy phlegm.   Past Medical History  Diagnosis Date  . Anemia, unspecified   . Extrinsic asthma, unspecified   . Chronic airway obstruction, not elsewhere classified   . Spondylosis of unspecified site without mention of myelopathy   . Dehydration   . Depressive disorder, not elsewhere classified   . Type II or unspecified type diabetes mellitus without mention of complication, not stated as uncontrolled   . Esophageal reflux   . Unspecified essential hypertension   . Unspecified menopausal and postmenopausal disorder   . Obesity, unspecified   . Inflammatory and toxic neuropathy, unspecified   . Unspecified sleep apnea   . Unspecified vitamin D deficiency   . Dysphagia 2007    historyof dysphagia with severe dysmotility by barium swallow-[name]  . Obesity   . Hypertension   . Diabetes mellitus without complication   . Allergic bronchitis   . Sleep apnea   . Gout 01/04/2013   Past Surgical History  Procedure Laterality Date  . Laser sugery      bilateral eyes  . Back surgery      fusion-multiple cervical spine  levels-Dr. Arnoldo Morale  . Excision of abcess      ?? Chest  . Dilation and curettage of uterus    . Exploratory laparotomy    . Partial hysterectomy    . Total knee arthroplasty  2005    Left  . Total knee arthroplasty  2005    Right- [name]  . Foot surgery      Right X 2  . Cholecystectomy    . Appendectomy    . Tonsillectomy    . Abdominal hysterectomy     Family History  Problem Relation Age of Onset  . Esophageal cancer Brother   . Esophageal cancer Sister     ?  . Colon polyps Brother   . Pancreatic cancer Sister     ?  . Diabetes Sister   . Diabetes      Aunt and Uncle  . Heart disease Maternal Grandfather    History  Substance Use Topics  . Smoking status: Former Smoker    Quit date: 06/22/1986  . Smokeless tobacco: Never Used  . Alcohol Use: No   OB History   Grav Para Term Preterm Abortions TAB SAB Ect Mult Living                 Review of Systems  Constitutional: Positive for activity change and fatigue. Negative for fever.  HENT: Positive for postnasal drip. Negative for sore throat.   Respiratory: Positive for cough. Negative for choking and shortness of breath.   Cardiovascular: Negative for chest pain.  Gastrointestinal: Negative.  Genitourinary: Negative.   Skin: Negative.     Allergies  Ace inhibitors; Penicillins; Tape; and Adhesive  Home Medications   Prior to Admission medications   Medication Sig Start Date End Date Taking? Authorizing Provider  aspirin 81 MG tablet Take 81 mg by mouth daily.     Yes Historical Provider, MD  cholecalciferol (VITAMIN D) 1000 UNITS tablet Take 2,000 Units by mouth daily.     Yes Historical Provider, MD  cyclobenzaprine (FLEXERIL) 5 MG tablet Take 5 mg by mouth 3 (three) times daily as needed for muscle spasms.   Yes Historical Provider, MD  diclofenac sodium (VOLTAREN) 1 % GEL Apply 4 g topically 4 (four) times daily. As needed to affected area 12/30/12  Yes Biagio Borg, MD  gabapentin (NEURONTIN) 800  MG tablet Take 1 tablet (800 mg total) by mouth 3 (three) times daily. 01/17/13  Yes Webb Silversmith, NP  glucose blood (ONE TOUCH ULTRA TEST) test strip Use to test blood sugars twice a day. Dx 250.63 10/06/13  Yes Renato Shin, MD  HYDROcodone-homatropine [facility]) 5-1.5 MG/5ML syrup Take 5 mLs by mouth every 6 (six) hours as needed for cough. 12/06/13  Yes Biagio Borg, MD  Insulin Glargine (LANTUS) 100 UNIT/ML Solostar Pen Inject 250 Units into the skin every morning. And pen needles 2/day 10/24/13  Yes Renato Shin, MD  irbesartan (AVAPRO) 300 MG tablet Take 300 mg by mouth daily.   Yes Historical Provider, MD  Lancets [facility] ULTRASOFT) lancets Use to test blood sugars twice a day. 07/20/13  Yes Renato Shin, MD  metoprolol (LOPRESSOR) 50 MG tablet Take 75 mg by mouth 2 (two) times daily.   Yes Historical Provider, MD  metoprolol (LOPRESSOR) 50 MG tablet TAKE 1& 1/2 TABLETS TWICE DAILY 12/06/13  Yes Biagio Borg, MD  montelukast (SINGULAIR) 10 MG tablet Take 1 tablet (10 mg total) by mouth daily. 12/06/13  Yes Biagio Borg, MD  omeprazole (PRILOSEC) 20 MG capsule Take 1 capsule (20 mg total) by mouth 2 (two) times daily. 01/16/13  Yes Biagio Borg, MD  promethazine-dextromethorphan (PROMETHAZINE-DM) 6.25-15 MG/5ML syrup TAKE 1 TEASPOONFUL BY MOUTH FOUR TIMES DAILY AS NEEDED   Yes Biagio Borg, MD  albuterol (PROVENTIL) (2.5 MG/3ML) 0.083% nebulizer solution Take 3 mLs (2.5 mg total) by nebulization every 6 (six) hours as needed. Respiratory distress 06/02/13   Biagio Borg, MD  beclomethasone (QVAR) 80 MCG/ACT inhaler Inhale 2 puffs into the lungs 2 (two) times daily. 10/26/13   Biagio Borg, MD  benzonatate (TESSALON) 100 MG capsule TAKE 1 TO 2 CAPSULES BY MOUTH EVERY 6 HOURS AS NEEDED FOR COUGH 10/31/13   Biagio Borg, MD  chlorpheniramine-HYDROcodone [facility]) 10-8 MG/5ML LQCR Take 5 mLs by mouth every 12 (twelve) hours as needed for cough. 12/22/13   Janne Napoleon, NP  cyclobenzaprine  (FLEXERIL) 5 MG tablet TAKE 1 TABLET BY MOUTH THREE TIMES DAILY AS NEEDED FOR MUSCLE SPASMS    Biagio Borg, MD  nystatin cream (MYCOSTATIN) Apply to affected area 2 times daily 12/06/13   Biagio Borg, MD  oxybutynin (DITROPAN-XL) 10 MG 24 hr tablet Take 1 tablet (10 mg total) by mouth daily. 08/03/13   Biagio Borg, MD  promethazine (PHENERGAN) 25 MG tablet TAKE 1 TABLET BY MOUTH EVERY 6 HOURS AS NEEDED FOR NAUSEA 12/01/13   Biagio Borg, MD   BP 120/79  Pulse 91  Temp(Src) 98.3 F (36.8 C) (Oral)  Resp 18  SpO2 96% Physical Exam  Nursing note and vitals reviewed. Constitutional: She is oriented to person, place, and time.  Morbidly obese, in no respiratory distress, sitting on the exam table with occasional dry cough.  HENT:  Right Ear: External ear normal.  Left Ear: External ear normal.  Oropharynx with copious amount of white frothy PND. No erythema or exudates.  Eyes: Conjunctivae and EOM are normal.  Neck: Normal range of motion. Neck supple.  Cardiovascular: Normal rate and regular rhythm.   Heart sounds distant due to body habitus  Pulmonary/Chest: She has no rales.  Diminished breath sounds bilaterally, partially due to body habitus, few scattered expiratory wheezes.  Lymphadenopathy:    She has no cervical adenopathy.  Neurological: She is alert and oriented to person, place, and time.  Skin: Skin is warm and dry.    ED Course  Procedures (including critical care time) Labs Review Labs Reviewed - No data to display  Imaging Review Dg Chest 2 View  12/22/2013   CLINICAL DATA:  Four month history of cough. Current history of diabetes, hypertension, COPD and asthma.  EXAM: CHEST  2 VIEW  COMPARISON:  Two-view chest x-ray 07/15/2013, 10/21/2012, 09/16/2011. CTA chest 10/21/2012.  FINDINGS: Cardiac silhouette upper normal in size, unchanged. Thoracic aorta mildly atherosclerotic, unchanged. Hilar and mediastinal contours otherwise unremarkable. Lungs clear. Bronchovascular  markings normal. Pulmonary vascularity normal. No visible pleural effusions. No pneumothorax. Degenerative changes involving the thoracic spine. No significant interval change.  IMPRESSION: No acute cardiopulmonary disease.  Stable examination.   Electronically Signed   By: Evangeline Dakin M.D.   On: 12/22/2013 19:38     MDM   1. Cough   2. PND (post-nasal drip)   3. COPD, moderate     She is already being treated for various etiologies for cough. She is not taking an ACE inhibitor due to angioedema. She does have reflux esophagitis but is taking a PPI Her ear canals are clear She does have some degree of bronchospasm but I suspect she is at baseline and she is being treated with bronchodilators and inhaled corticosteroids There is a copious amount of frothy PND and is taking Singulair. I would suggest that an antihistamine may decrease the amount of drainage. Therefore, I will prescribe Tussionex with some caution, 1 teaspoon every 12 hours. She is warned of drowsiness and sedation this medication can cause. If she has too much sedation, dizziness, ataxia or worsening of any of her chronic illness symptoms and she should either reduce the dose by 50% or stop it altogether. At this medicine helps with her cough and is present at the etiology is from persistent PND. Followup with PCP.    Janne Napoleon, NP 12/22/13 2000

## 2013-12-22 NOTE — Discharge Instructions (Signed)
Cough, Adult  A cough is a reflex that helps clear your throat and airways. It can help heal the body or may be a reaction to an irritated airway. A cough may only last 2 or 3 weeks (acute) or may last more than 8 weeks (chronic).  CAUSES Acute cough:  Viral or bacterial infections. Chronic cough:  Infections.  Allergies.  Asthma.  Post-nasal drip.  Smoking.  Heartburn or acid reflux.  Some medicines.  Chronic lung problems (COPD).  Cancer. SYMPTOMS   Cough.  Fever.  Chest pain.  Increased breathing rate.  High-pitched whistling sound when breathing (wheezing).  Colored mucus that you cough up (sputum). TREATMENT   A bacterial cough may be treated with antibiotic medicine.  A viral cough must run its course and will not respond to antibiotics.  Your caregiver may recommend other treatments if you have a chronic cough. HOME CARE INSTRUCTIONS   Only take over-the-counter or prescription medicines for pain, discomfort, or fever as directed by your caregiver. Use cough suppressants only as directed by your caregiver.  Use a cold steam vaporizer or humidifier in your bedroom or home to help loosen secretions.  Sleep in a semi-upright position if your cough is worse at night.  Rest as needed.  Stop smoking if you smoke. SEEK IMMEDIATE MEDICAL CARE IF:   You have pus in your sputum.  Your cough starts to worsen.  You cannot control your cough with suppressants and are losing sleep.  You begin coughing up blood.  You have difficulty breathing.  You develop pain which is getting worse or is uncontrolled with medicine.  You have a fever. MAKE SURE YOU:   Understand these instructions.  Will watch your condition.  Will get help right away if you are not doing well or get worse. Document Released: 12/05/2010 Document Revised: 08/31/2011 Document Reviewed: 12/05/2010 [facility] Patient Information 2015 [place], [place]. This information is not intended  to replace advice given to you by your health care provider. Make sure you discuss any questions you have with your health care provider.  Chronic Obstructive Pulmonary Disease Chronic obstructive pulmonary disease (COPD) is a common lung problem. In COPD, the flow of air from the lungs is limited. The way your lungs work will probably never return to normal, but there are things you can do to improve you lungs and make yourself feel better. HOME CARE  Take all medicines as told by your doctor.  Only take over-the-counter or prescription medicines as told by your doctor.  Avoid medicines or cough syrups that dry up your airway (such as antihistamines) and do not allow you to get rid of thick spit. You do not need to avoid them if told differently by your doctor.  If you smoke, stop. Smoking makes the problem worse.  Avoid being around things that make your breathing worse (like smoke, chemicals, and fumes).  Use oxygen therapy and therapy to help improve your lungs (pulmonary rehabilitation) if told by your doctor. If you need home oxygen therapy, ask your doctor if you should buy a tool to measure your oxygen level (oximeter).  Avoid people who have a sickness you can catch (contagious).  Avoid going outside when it is very hot, cold, or humid.  Eat healthy foods. Eat smaller meals more often. Rest before meals.  Stay active, but remember to also rest.  Make sure to get all the shots (vaccines) your doctor recommends. Ask your doctor if you need a pneumonia shot.  Learn and  use tips on how to relax.  Learn and use tips on how to control your breathing as told by your doctor. Try:  Breathing in (inhaling) through your nose for 1 second. Then, pucker your lips and breath out (exhale) through your lips for 2 seconds.  Putting one hand on your belly (abdomen). Breathe in slowly through your nose for 1 second. Your hand on your belly should move out. Pucker your lips and breathe out  slowly through your lips. Your hand on your belly should move in as you breathe out.  Learn and use controlled coughing to clear thick spit from your lungs. 1. Lean your head a little forward. 2. Breathe in deeply. 3. Try to hold your breath for 3 seconds. 4. Keep your mouth slightly open while coughing 2 times. 5. Spit any thick spit out into a tissue. 6. Rest and do the steps again 1 or 2 times as needed. GET HELP IF:  You cough up more thick spit than usual.  There is a change in the color or thickness of the spit.  It is harder to breathe than usual.  Your breathing is faster than usual. GET HELP RIGHT AWAY IF:   You have shortness of breath while resting.  You have shortness of breath that stops you from:  Being able to talk.  Doing normal activities.  You chest hurts for longer than 5 minutes.  Your skin color is more blue than usual.  Your pulse oximeter shows that you have low oxygen for longer than 5 minutes. MAKE SURE YOU:   Understand these instructions.  Will watch your condition.  Will get help right away if you are not doing well or get worse. Document Released: 11/25/2007 Document Revised: 03/29/2013 Document Reviewed: 02/02/2013 [facility] Patient Information 2015 [place], [place]. This information is not intended to replace advice given to you by your health care provider. Make sure you discuss any questions you have with your health care provider.

## 2013-12-22 NOTE — ED Notes (Signed)
C/o persitent cough for over 4 months.  Cough is productive clear to foamy sputum.  Chest soreness.  Sob.  States had several treatments with steroids and cough syrup but has had no relief.

## 2013-12-23 NOTE — ED Provider Notes (Signed)
Medical screening examination/treatment/procedure(s) were performed by non-physician practitioner and as supervising physician I was immediately available for consultation/collaboration.  Philipp Deputy, M.D.  Harden Mo, MD 12/23/13 [phone]

## 2013-12-26 ENCOUNTER — Telehealth: Payer: Self-pay | Admitting: *Deleted

## 2013-12-26 NOTE — Telephone Encounter (Signed)
[facility] Triage Call Report Triage Record Num: [id number] Operator: Jeanett Schlein Patient Name: Christy May Call Date & Time: 12/22/2013 2:56:41PM Patient Phone: PCP: Cathlean Cower Patient Gender: Female PCP Fax : [phone] Patient DOB: [birth year] Practice Name: [facility] Reason for Call: Caller: [name]/Patient; PCP: Cathlean Cower (Adults only); CB#: [phone]; Patient calling, she has had a cough and using Hydrocodone Cough Syrup. She is out and asking for a refill. She has urinary incontinence with the cough also. She has been out of the medication on 7/1. She was trying not to have to get a refill but she cannot rest. The Singular has helped with some of the sinus issues that she was having. She has an intermittent h/a. Her cough is productive, clear. Afebrile. Triaged per Diabetes: Respiratory Problems. Needs to be seen in 72 hours for "Generalized mild frontal headache unresponsive to home care measure". The office has already closed for the day. Suggested OTC cough medication for daibetics. She states that this will not work. Referred to [facility]. Protocol(s) Used: Diabetes: Respiratory Problems Recommended Outcome per Protocol: See Provider within 72 Hours Reason for Outcome: Generalized mild frontal headache unresponsive to home care measures Care Advice: ~ SYMPTOM / CONDITION MANAGEMENT 12/22/2013 4:01:42PM Page 1 of 1 [id number]

## 2014-01-03 ENCOUNTER — Telehealth: Payer: Self-pay | Admitting: *Deleted

## 2014-01-03 NOTE — Telephone Encounter (Signed)
Calling to see if my shoes have come in.  Thank you so much and have a blessed day!  I called and left her a message that shoes are not here yet.  Caryl Pina will contact her when they come in.

## 2014-01-04 ENCOUNTER — Encounter: Payer: Self-pay | Admitting: Emergency Medicine

## 2014-01-04 ENCOUNTER — Other Ambulatory Visit: Payer: Self-pay | Admitting: Internal Medicine

## 2014-01-04 ENCOUNTER — Ambulatory Visit (INDEPENDENT_AMBULATORY_CARE_PROVIDER_SITE_OTHER): Payer: Medicare Other | Admitting: Emergency Medicine

## 2014-01-04 ENCOUNTER — Telehealth: Payer: Self-pay | Admitting: Emergency Medicine

## 2014-01-04 VITALS — BP 132/80 | HR 89 | Ht 64.0 in | Wt 330.0 lb

## 2014-01-04 DIAGNOSIS — J45909 Unspecified asthma, uncomplicated: Secondary | ICD-10-CM | POA: Diagnosis not present

## 2014-01-04 MED ORDER — ESOMEPRAZOLE MAGNESIUM 40 MG PO CPDR: 40.0000 mg | DELAYED_RELEASE_CAPSULE | Freq: Two times a day (BID) | ORAL | Status: DC

## 2014-01-04 MED ORDER — FLUTICASONE PROPIONATE 50 MCG/ACT NA SUSP: 2.0000 | Freq: Every day | NASAL | Status: DC

## 2014-01-04 NOTE — Addendum Note (Signed)
Addended by: Maurice March on: 01/04/2014 03:53 PM   Modules accepted: Orders

## 2014-01-04 NOTE — Assessment & Plan Note (Signed)
Contributors appear to include poorly controlled reflux, allergic rhinitis, possibly asthma. We need to solidify the asthma diagnosis if it is present.   We will perform full pulmonary function testing  We will change your omeprazole to nexium 40mg  twice a day until our next visit Continue your singulair Stop QVAR for now Keep albuterol available to use 2 puffs if needed for shortness of breath Start loratadine (Claritin) 10mg  daily Start fluticasone nasal spray, 2 sprays each nostril daily

## 2014-01-04 NOTE — Telephone Encounter (Signed)
LMTCB

## 2014-01-04 NOTE — Patient Instructions (Signed)
We will perform full pulmonary function testing  We will change your omeprazole to nexium 40mg  twice a day until our next visit Continue your singulair Stop QVAR for now Keep albuterol available to use 2 puffs if needed for shortness of breath Start loratadine (Claritin) 10mg  daily Start fluticasone nasal spray, 2 sprays each nostril daily Follow with Dr Lamonte Sakai in 1 month

## 2014-01-04 NOTE — Assessment & Plan Note (Signed)
Contributors appear to include poorly controlled reflux, allergic rhinitis, possibly asthma. We need to solidify the asthma diagnosis if it is present.   We will perform full pulmonary function testing  We will change your omeprazole to nexium 40mg  twice a day until our next visit Continue your singulair Stop QVAR for now Keep albuterol available to use 2 puffs if needed for shortness of breath Start loratadine (Claritin) 10mg  daily Start fluticasone nasal spray, 2 sprays each nostril daily Follow with Dr Lamonte Sakai in 1 month

## 2014-01-04 NOTE — Progress Notes (Signed)
Subjective:    Patient ID: Christy May, female    DOB: [birth year], 63 y.o.   MRN: [id number]  HPI 63 yo woman, former smoker with obesity, OSA, HTN on irbesartan, DM, childhood asthma. Carries a dx of adult asthma made several years ago. Referred for cough. She describes cough that has been prod of white phlegm, started after URI sx about 4 months ago. Describes a tickle in her throat. Can result in stress incontinence.   She has been on Advair in the past. Was recently changed to QVAR + albuterol, uses about 2-3 x a month. She is on omeprazole 20mg  twice a day. She still has occasional reflux sx > can reflux all the way up to throat. She has allergy and rhinitis sx frequently - singulair has been added recently and has helped her.   CXR 12/22/13 - normal.   Review of Systems  Constitutional: Negative for fever and unexpected weight change.  HENT: Negative for congestion, dental problem, ear pain, nosebleeds, postnasal drip, rhinorrhea, sinus pressure, sneezing, sore throat and trouble swallowing.   Eyes: Negative for redness and itching.  Respiratory: Positive for cough and shortness of breath. Negative for chest tightness and wheezing.   Cardiovascular: Positive for chest pain. Negative for palpitations and leg swelling.  Gastrointestinal: Positive for nausea. Negative for vomiting.  Genitourinary: Negative for dysuria.  Musculoskeletal: Negative for joint swelling.  Skin: Negative for rash.  Neurological: Negative for headaches.  Hematological: Does not bruise/bleed easily.  Psychiatric/Behavioral: Negative for dysphoric mood. The patient is not nervous/anxious.    Past Medical History  Diagnosis Date  . Anemia, unspecified   . Extrinsic asthma, unspecified   . Chronic airway obstruction, not elsewhere classified   . Spondylosis of unspecified site without mention of myelopathy   . Dehydration   . Depressive disorder, not elsewhere classified   . Type II or unspecified type  diabetes mellitus without mention of complication, not stated as uncontrolled   . Esophageal reflux   . Unspecified essential hypertension   . Unspecified menopausal and postmenopausal disorder   . Obesity, unspecified   . Inflammatory and toxic neuropathy, unspecified   . Unspecified sleep apnea   . Unspecified vitamin D deficiency   . Dysphagia 2007    historyof dysphagia with severe dysmotility by barium swallow-[name]  . Obesity   . Hypertension   . Diabetes mellitus without complication   . Allergic bronchitis   . Sleep apnea   . Gout 01/04/2013     Family History  Problem Relation Age of Onset  . Esophageal cancer Brother   . Esophageal cancer Sister     ?  . Colon polyps Brother   . Pancreatic cancer Sister     ?  . Diabetes Sister   . Diabetes      Aunt and Uncle  . Heart disease Maternal Grandfather      History   Social History  . Marital Status: Widowed    Spouse Name: N/A    Number of Children: N/A  . Years of Education: N/A   Occupational History  . disabled    Social History Main Topics  . Smoking status: Former Smoker    Quit date: 06/22/1986  . Smokeless tobacco: Never Used  . Alcohol Use: No  . Drug Use: No  . Sexual Activity: Not on file   Other Topics Concern  . Not on file   Social History Narrative   ** Merged History Encounter **  Patient does not get regular exercise.      Widowed 2004      Disabled     Allergies  Allergen Reactions  . Ace Inhibitors Anaphylaxis  . Penicillins Hives and Rash  . Tape Hives    Paper tape is ok to use  . Adhesive [Tape] Rash     Outpatient Prescriptions Prior to Visit  Medication Sig Dispense Refill  . albuterol (PROVENTIL) (2.5 MG/3ML) 0.083% nebulizer solution Take 3 mLs (2.5 mg total) by nebulization every 6 (six) hours as needed. Respiratory distress  75 mL  11  . aspirin 81 MG tablet Take 81 mg by mouth daily.        . beclomethasone (QVAR) 80 MCG/ACT inhaler Inhale 2 puffs  into the lungs 2 (two) times daily.  1 Inhaler  12  . chlorpheniramine-HYDROcodone (TUSSIONEX PENNKINETIC ER) 10-8 MG/5ML LQCR Take 5 mLs by mouth every 12 (twelve) hours as needed for cough.  115 mL  0  . cholecalciferol (VITAMIN D) 1000 UNITS tablet Take 2,000 Units by mouth daily.        . cyclobenzaprine (FLEXERIL) 5 MG tablet TAKE 1 TABLET BY MOUTH THREE TIMES DAILY AS NEEDED FOR MUSCLE SPASMS  90 tablet  0  . diclofenac sodium (VOLTAREN) 1 % GEL Apply 4 g topically 4 (four) times daily. As needed to affected area  100 g  5  . gabapentin (NEURONTIN) 800 MG tablet Take 1 tablet (800 mg total) by mouth 3 (three) times daily.  270 tablet  3  . glucose blood (ONE TOUCH ULTRA TEST) test strip Use to test blood sugars twice a day. Dx 250.63  100 each  12  . Insulin Glargine (LANTUS) 100 UNIT/ML Solostar Pen Inject 250 Units into the skin every morning. And pen needles 2/day  25 pen  2  . irbesartan (AVAPRO) 300 MG tablet Take 300 mg by mouth daily.      . Lancets (ONETOUCH ULTRASOFT) lancets Use to test blood sugars twice a day.  100 each  12  . metoprolol (LOPRESSOR) 50 MG tablet TAKE 1& 1/2 TABLETS TWICE DAILY  270 tablet  3  . montelukast (SINGULAIR) 10 MG tablet Take 1 tablet (10 mg total) by mouth daily.  30 tablet  11  . nystatin cream (MYCOSTATIN) Apply to affected area 2 times daily  30 g  0  . omeprazole (PRILOSEC) 20 MG capsule Take 1 capsule (20 mg total) by mouth 2 (two) times daily.  180 capsule  3  . oxybutynin (DITROPAN-XL) 10 MG 24 hr tablet Take 1 tablet (10 mg total) by mouth daily.  90 tablet  3  . promethazine (PHENERGAN) 25 MG tablet TAKE 1 TABLET BY MOUTH EVERY 6 HOURS AS NEEDED FOR NAUSEA  30 tablet  0  . promethazine-dextromethorphan (PROMETHAZINE-DM) 6.25-15 MG/5ML syrup TAKE 1 TEASPOONFUL BY MOUTH FOUR TIMES DAILY AS NEEDED  240 mL  0  . benzonatate (TESSALON) 100 MG capsule TAKE 1 TO 2 CAPSULES BY MOUTH EVERY 6 HOURS AS NEEDED FOR COUGH  60 capsule  0  . cyclobenzaprine  (FLEXERIL) 5 MG tablet Take 5 mg by mouth 3 (three) times daily as needed for muscle spasms.      . metoprolol (LOPRESSOR) 50 MG tablet Take 75 mg by mouth 2 (two) times daily.      . cyclobenzaprine (FLEXERIL) 5 MG tablet TAKE 1 TABLET BY MOUTH THREE TIMES DAILY AS NEEDED FOR MUSCLE SPASMS  90 tablet  0  . HYDROcodone-homatropine (HYCODAN)  5-1.5 MG/5ML syrup Take 5 mLs by mouth every 6 (six) hours as needed for cough.  180 mL  0   No facility-administered medications prior to visit.         Objective:   Physical Exam Filed Vitals:   01/04/14 1528  BP: 132/80  Pulse: 89  Height: 5\' 4"  (1.626 m)  Weight: 330 lb (149.687 kg)  SpO2: 94%   Gen: Pleasant, obese woman, in no distress,  normal affect  ENT: No lesions,  mouth clear,  oropharynx clear, no postnasal drip, mild nasal congestion  Neck: No JVD, no TMG, no carotid bruits  Lungs: No use of accessory muscles, clear without rales or rhonchi  Cardiovascular: RRR, heart sounds normal, no murmur or gallops, 1+ LE peripheral edema  Musculoskeletal: No deformities, no cyanosis or clubbing  Neuro: alert, non focal  Skin: Warm, no lesions or rashes      Assessment & Plan:  Cough Contributors appear to include poorly controlled reflux, allergic rhinitis, possibly asthma. We need to solidify the asthma diagnosis if it is present.   We will perform full pulmonary function testing  We will change your omeprazole to nexium 40mg  twice a day until our next visit Continue your singulair Stop QVAR for now Keep albuterol available to use 2 puffs if needed for shortness of breath Start loratadine (Claritin) 10mg  daily Start fluticasone nasal spray, 2 sprays each nostril daily Follow with Dr Lamonte Sakai in 1 month

## 2014-01-05 DIAGNOSIS — M545 Low back pain, unspecified: Secondary | ICD-10-CM | POA: Diagnosis not present

## 2014-01-05 DIAGNOSIS — M5106 Intervertebral disc disorders with myelopathy, lumbar region: Secondary | ICD-10-CM | POA: Diagnosis not present

## 2014-01-05 MED ORDER — ALBUTEROL SULFATE HFA 108 (90 BASE) MCG/ACT IN AERS: 2.0000 | INHALATION_SPRAY | Freq: Four times a day (QID) | RESPIRATORY_TRACT | Status: DC | PRN

## 2014-01-05 NOTE — Telephone Encounter (Signed)
Pt returned call

## 2014-01-05 NOTE — Telephone Encounter (Signed)
Lmtcbx2. Christy May, CMA  

## 2014-01-05 NOTE — Telephone Encounter (Signed)
Spoke with the pt  She states that she is needing sample of albuterol inhaler  I advised that we do not have any at this time, but have sent rx for this to her pharm  Nothing further needed

## 2014-01-09 ENCOUNTER — Telehealth: Payer: Self-pay | Admitting: Emergency Medicine

## 2014-01-09 MED ORDER — HYDROCOD POLST-CHLORPHEN POLST 10-8 MG/5ML PO LQCR: 5.0000 mL | Freq: Two times a day (BID) | ORAL | Status: DC | PRN

## 2014-01-09 NOTE — Telephone Encounter (Signed)
Pt aware that Tussionex Rx printed and placed up front for pick up Nothing further needed.

## 2014-01-09 NOTE — Telephone Encounter (Signed)
Pt saw RB 01/04/14. Pt reports she received RX for tussionex cough syrup from [facility] 12/23/14. She ran out yesterday. She is wanting RB to refill this for her. Reports she is still coughing and this helps calm her cough. According to epic RB is 11 pm elink. Will forward to DOD. Please advise SN thanks  Allergies  Allergen Reactions  . Ace Inhibitors Anaphylaxis  . Penicillins Hives and Rash  . Tape Hives    Paper tape is ok to use  . Adhesive [Tape] Rash

## 2014-01-09 NOTE — Telephone Encounter (Signed)
Per SN: okay to refill tussionex #4 0z 1 po q12hrs prn x 0 refills  RX Signed by SN and placed upfront for pick up.  Called pt on mobile # and home #--LMTCB x1

## 2014-01-09 NOTE — Telephone Encounter (Signed)
Pt will be leaving within the next 30 min and cn be reached @ [phone] if no answer @ hm.Christy May

## 2014-01-10 ENCOUNTER — Ambulatory Visit: Payer: Medicare Other | Admitting: Endocrinology

## 2014-01-15 ENCOUNTER — Other Ambulatory Visit: Payer: Self-pay | Admitting: Internal Medicine

## 2014-01-15 NOTE — Telephone Encounter (Signed)
Last filled 01/17/13 by Regina Baity 90 day with 3 refills--last OV with you was 12/06/13--please advise if okay to refill

## 2014-01-15 NOTE — Telephone Encounter (Signed)
Done erx 

## 2014-01-25 DIAGNOSIS — H04229 Epiphora due to insufficient drainage, unspecified lacrimal gland: Secondary | ICD-10-CM | POA: Diagnosis not present

## 2014-01-31 ENCOUNTER — Telehealth: Payer: Self-pay

## 2014-01-31 ENCOUNTER — Telehealth: Payer: Self-pay | Admitting: Emergency Medicine

## 2014-01-31 DIAGNOSIS — E1049 Type 1 diabetes mellitus with other diabetic neurological complication: Secondary | ICD-10-CM

## 2014-01-31 DIAGNOSIS — E1065 Type 1 diabetes mellitus with hyperglycemia: Principal | ICD-10-CM

## 2014-01-31 NOTE — Telephone Encounter (Signed)
Called and spoke with pt and she stated that she has been given steroids, abx and steroid shots, another type of cough meds and the tessalon perles and these did not work for her.  She stated that when she coughs she is having to wear pull up pants and she is using 4-5 of these daily due to all the coughing that she is doing.  She has an appt with RB on 8/26  and she will keep this appt.  She stated that this makes her very uncomfortable but she stated that this is 1.5 weeks away and she feels that she needs the cough meds.  Please advise. thanks

## 2014-01-31 NOTE — Telephone Encounter (Signed)
Called and spoke with pt and she is requesting that her tussionex be refilled.  RB is out of the office.  Will forward to DOD to see if this can be refilled.   Last ov--01/04/2014  Allergies  Allergen Reactions  . Ace Inhibitors Anaphylaxis  . Penicillins Hives and Rash  . Tape Hives    Paper tape is ok to use  . Adhesive [Tape] Rash    Current Outpatient Prescriptions on File Prior to Visit  Medication Sig Dispense Refill  . albuterol (PROVENTIL HFA;VENTOLIN HFA) 108 (90 BASE) MCG/ACT inhaler Inhale 2 puffs into the lungs every 6 (six) hours as needed for wheezing or shortness of breath.  1 Inhaler  1  . albuterol (PROVENTIL) (2.5 MG/3ML) 0.083% nebulizer solution Take 3 mLs (2.5 mg total) by nebulization every 6 (six) hours as needed. Respiratory distress  75 mL  11  . aspirin 81 MG tablet Take 81 mg by mouth daily.        . beclomethasone (QVAR) 80 MCG/ACT inhaler Inhale 2 puffs into the lungs 2 (two) times daily.  1 Inhaler  12  . chlorpheniramine-HYDROcodone (TUSSIONEX PENNKINETIC ER) 10-8 MG/5ML LQCR Take 5 mLs by mouth every 12 (twelve) hours as needed for cough.  120 mL  0  . cholecalciferol (VITAMIN D) 1000 UNITS tablet Take 2,000 Units by mouth daily.        . cyclobenzaprine (FLEXERIL) 5 MG tablet TAKE 1 TABLET BY MOUTH THREE TIMES DAILY AS NEEDED FOR MUSCLE SPASMS  90 tablet  0  . diclofenac sodium (VOLTAREN) 1 % GEL Apply 4 g topically 4 (four) times daily. As needed to affected area  100 g  5  . esomeprazole (NEXIUM) 40 MG capsule Take 1 capsule (40 mg total) by mouth 2 (two) times daily.  60 capsule  3  . fluticasone (FLONASE) 50 MCG/ACT nasal spray Place 2 sprays into both nostrils daily.  16 g  2  . gabapentin (NEURONTIN) 800 MG tablet TAKE 1 TABLET BY MOUTH THREE TIMES DAILY  270 tablet  1  . glucose blood (ONE TOUCH ULTRA TEST) test strip Use to test blood sugars twice a day. Dx 250.63  100 each  12  . Insulin Glargine (LANTUS) 100 UNIT/ML Solostar Pen Inject 250 Units  into the skin every morning. And pen needles 2/day  25 pen  2  . irbesartan (AVAPRO) 300 MG tablet Take 300 mg by mouth daily.      . Lancets (ONETOUCH ULTRASOFT) lancets Use to test blood sugars twice a day.  100 each  12  . metoprolol (LOPRESSOR) 50 MG tablet TAKE 1& 1/2 TABLETS TWICE DAILY  270 tablet  3  . montelukast (SINGULAIR) 10 MG tablet Take 1 tablet (10 mg total) by mouth daily.  30 tablet  11  . nystatin cream (MYCOSTATIN) Apply to affected area 2 times daily  30 g  0  . omeprazole (PRILOSEC) 20 MG capsule Take 1 capsule (20 mg total) by mouth 2 (two) times daily.  180 capsule  3  . oxybutynin (DITROPAN-XL) 10 MG 24 hr tablet Take 1 tablet (10 mg total) by mouth daily.  90 tablet  3  . promethazine (PHENERGAN) 25 MG tablet TAKE 1 TABLET BY MOUTH EVERY 6 HOURS AS NEEDED FOR NAUSEA  30 tablet  0  . promethazine-dextromethorphan (PROMETHAZINE-DM) 6.25-15 MG/5ML syrup TAKE 1 TEASPOONFUL BY MOUTH FOUR TIMES DAILY AS NEEDED  240 mL  0   No current facility-administered medications on file prior to  visit.

## 2014-01-31 NOTE — Telephone Encounter (Signed)
ATC pt line busy x 4 wcb 

## 2014-01-31 NOTE — Telephone Encounter (Signed)
Had tussionex given to her 12/22/13 at [facility] urgent care, then refilled by SN or weekend 7/21. Would prefer not to give her more, but rather treat the cause of her cough. Can try tessalon pearls 100mg , take 2 every 6 hrs if needed for cough.  #30, no fills. Needs ov with RB to address this soon if she doesn't already have apptm.

## 2014-01-31 NOTE — Telephone Encounter (Signed)
Spoke with the pt. She refuses tramadol and appt scheduled to see tp. Nothing further needed

## 2014-01-31 NOTE — Telephone Encounter (Signed)
If she does not want to try tessalon pearls, can try tramadol.  I would really like to avoid narcotic cough syrup. Tramadol 50mg  every 6 hrs if needed for cough. Minimize voice use, use hard candy (no mint/menthol/cough drop) throughout the day. If this is not helping her, will need an earlier OV.

## 2014-01-31 NOTE — Telephone Encounter (Signed)
Pt called concerning weight loss surgery. Pt states that she went to the weight loss seminar and was informed that was not eligible for the surgery do to her acid reflux. Pt was inquiring about the diabetic weigh loss program? I am not familiar with this. Please advise, Thanks!

## 2014-01-31 NOTE — Telephone Encounter (Signed)
Lvom informing pt that referral had been placed.

## 2014-01-31 NOTE — Telephone Encounter (Signed)
i have requested for you an appointment with a dietician.  you will receive a phone call, about a day and time for an appointment

## 2014-02-01 ENCOUNTER — Other Ambulatory Visit: Payer: Self-pay | Admitting: Internal Medicine

## 2014-02-01 ENCOUNTER — Ambulatory Visit (INDEPENDENT_AMBULATORY_CARE_PROVIDER_SITE_OTHER): Payer: Medicare Other | Admitting: Adult Health

## 2014-02-01 ENCOUNTER — Encounter: Payer: Self-pay | Admitting: Adult Health

## 2014-02-01 VITALS — BP 112/64 | HR 94 | Temp 98.5°F | Ht 64.0 in | Wt 329.8 lb

## 2014-02-01 DIAGNOSIS — R059 Cough, unspecified: Secondary | ICD-10-CM | POA: Diagnosis not present

## 2014-02-01 DIAGNOSIS — R05 Cough: Secondary | ICD-10-CM

## 2014-02-01 MED ORDER — HYDROCODONE-HOMATROPINE 5-1.5 MG/5ML PO SYRP: 5.0000 mL | ORAL_SOLUTION | Freq: Four times a day (QID) | ORAL | Status: DC | PRN

## 2014-02-01 NOTE — Patient Instructions (Signed)
Add Pepcid 20mg  At bedtime  .  Add Chlortrimeton 4mg  2 tabs At bedtime   Begin Delsym 2 tsp Twice daily   May Hydromet 1-2 tsp every 4hr as needed for cough, may make you sleepy.  Continue on Claritin 10mg  daily  Continue on Nexium 40mg  twice a day  Continue your singulair May use albuterol 2 puffs if needed for shortness of breath or Albuterol Neb -these are your rescue inhalers, to be used only as needed.  Continue fluticasone nasal spray, 2 sprays each nostril daily Follow with Dr Lamonte Sakai in 1 month as planned  Follow up for full pulmonary function testing as planned  Please contact office for sooner follow up if symptoms do not improve or worsen or seek emergency care  NO MINTS

## 2014-02-01 NOTE — Progress Notes (Signed)
Subjective:    Patient ID: Christy May, female    DOB: 10/09/1950, 63 y.o.   MRN: [id number]  HPI 63 yo woman, former smoker with obesity, OSA, HTN on irbesartan, DM, childhood asthma. Carries a dx of adult asthma made several years ago. Referred for cough. She describes cough that has been prod of white phlegm, started after URI sx about 4 months ago. Describes a tickle in her throat. Can result in stress incontinence.   She has been on Advair in the past. Was recently changed to QVAR + albuterol, uses about 2-3 x a month. She is on omeprazole 20mg  twice a day. She still has occasional reflux sx > can reflux all the way up to throat. She has allergy and rhinitis sx frequently - singulair has been added recently and has helped her.   CXR 12/22/13 - normal.  We will perform full pulmonary function testing  We will change your omeprazole to nexium 40mg  twice a day until our next visit Continue your singulair Stop QVAR for now Keep albuterol available to use 2 puffs if needed for shortness of breath Start loratadine (Claritin) 10mg  daily Start fluticasone nasal spray, 2 sprays each nostril daily Follow with Dr Lamonte Sakai in 1 month   02/01/2014 Acute OV  Returns for persistent cough. Seen 1 month ago. Seen last month for pulmonary consult for cough.  She is quite upset that her cough is no better and she can not go anywhere due to stress incontinence w/ coughing fits. Has been going on for ~5 months  On Lopressor 75mg  Twice daily   CXR w/ no acute process 12/22/13 .  Former smoker, PFT pending.  Last ov was changed from prilosec to nexium for better GERD control. She continues to have intermittent reflux.  QVAR was held due to no improvement in symptoms. Previous on Advair in past without help.  No fever, orthopnea, edema , n/v/d, discolored mucus. Does have some sinus drainage.  Has used tussionex with some help, but is out of this.      Review of Systems Constitutional:   No  weight  loss, night sweats,  Fevers, chills,  +fatigue, or  lassitude.  HEENT:   No headaches,  Difficulty swallowing,  Tooth/dental problems, or  Sore throat,                No sneezing, itching, ear ache,  +nasal congestion, post nasal drip,   CV:  No chest pain,  Orthopnea, PND, swelling in lower extremities, anasarca, dizziness, palpitations, syncope.   GI  No heartburn, indigestion, abdominal pain, nausea, vomiting, diarrhea, change in bowel habits, loss of appetite, bloody stools.   Resp: No shortness of breath with exertion or at rest.  No excess mucus, no productive cough,  No non-productive cough,  No coughing up of blood.  No change in color of mucus.  No wheezing.  No chest wall deformity  Skin: no rash or lesions.  GU: no dysuria, change in color of urine, no urgency or frequency.  No flank pain, no hematuria   MS:  No joint pain or swelling.  No decreased range of motion.  No back pain.  Psych:  No change in mood or affect. No depression or anxiety.  No memory loss.          Objective:   Physical Exam GEN: A/Ox3; pleasant , NAD, obese   HEENT:  [place]/AT,  EACs-clear, TMs-wnl, NOSE-clear, THROAT-clear, no lesions, no postnasal drip or exudate noted.  NECK:  Supple w/ fair ROM; no JVD; normal carotid impulses w/o bruits; no thyromegaly or nodules palpated; no lymphadenopathy.  RESP  Clear  P & A; w/o, wheezes/ rales/ or rhonchi.no accessory muscle use, no dullness to percussion  CARD:  RRR, no m/r/g  , tr peripheral edema, pulses intact, no cyanosis or clubbing.  GI:   Soft & nt; nml bowel sounds; no organomegaly or masses detected.  Musco: Warm bil, no deformities or joint swelling noted.   Neuro: alert, no focal deficits noted.    Skin: Warm, no lesions or rashes         Assessment & Plan:

## 2014-02-01 NOTE — Assessment & Plan Note (Signed)
Chronic cough ? Triggers >GERD /AR  cxr was nml  Cont on cyclical cough control with Trigger prevention Needs PFT    Plan  Add Pepcid 20mg  At bedtime  .  Add Chlortrimeton 4mg  2 tabs At bedtime   Begin Delsym 2 tsp Twice daily   May Hydromet 1-2 tsp every 4hr as needed for cough, may make you sleepy.  Continue on Claritin 10mg  daily  Continue on Nexium 40mg  twice a day  Continue your singulair May use albuterol 2 puffs if needed for shortness of breath or Albuterol Neb -these are your rescue inhalers, to be used only as needed.  Continue fluticasone nasal spray, 2 sprays each nostril daily Follow with Dr Lamonte Sakai in 1 month as planned  Follow up for full pulmonary function testing as planned  Please contact office for sooner follow up if symptoms do not improve or worsen or seek emergency care  NO MINTS

## 2014-02-06 ENCOUNTER — Ambulatory Visit: Payer: Medicare Other | Admitting: Endocrinology

## 2014-02-12 ENCOUNTER — Other Ambulatory Visit: Payer: Self-pay | Admitting: Internal Medicine

## 2014-02-13 ENCOUNTER — Ambulatory Visit: Payer: Medicare Other | Admitting: Emergency Medicine

## 2014-02-14 ENCOUNTER — Encounter: Payer: Self-pay | Admitting: Emergency Medicine

## 2014-02-14 ENCOUNTER — Ambulatory Visit (INDEPENDENT_AMBULATORY_CARE_PROVIDER_SITE_OTHER): Payer: Medicare Other | Admitting: Emergency Medicine

## 2014-02-14 VITALS — BP 134/70 | HR 89 | Ht 64.0 in | Wt 311.0 lb

## 2014-02-14 DIAGNOSIS — R059 Cough, unspecified: Secondary | ICD-10-CM | POA: Diagnosis not present

## 2014-02-14 DIAGNOSIS — R05 Cough: Secondary | ICD-10-CM

## 2014-02-14 DIAGNOSIS — J45909 Unspecified asthma, uncomplicated: Secondary | ICD-10-CM | POA: Diagnosis not present

## 2014-02-14 LAB — PULMONARY FUNCTION TEST
DL/VA % pred: 110 %
DL/VA: 5.28 ml/min/mmHg/L
DLCO UNC % PRED: 69 %
DLCO UNC: 16.88 ml/min/mmHg
FEF 25-75 POST: 3.28 L/s
FEF 25-75 Pre: 3.37 L/sec
FEF2575-%Change-Post: -2 %
FEF2575-%PRED-POST: 171 %
FEF2575-%Pred-Pre: 176 %
FEV1-%CHANGE-POST: 0 %
FEV1-%PRED-POST: 93 %
FEV1-%Pred-Pre: 94 %
FEV1-POST: 1.86 L
FEV1-PRE: 1.87 L
FEV1FVC-%Change-Post: -5 %
FEV1FVC-%Pred-Pre: 118 %
FEV6-%Change-Post: 2 %
FEV6-%Pred-Post: 83 %
FEV6-%Pred-Pre: 81 %
FEV6-POST: 2.05 L
FEV6-Pre: 2 L
FEV6FVC-%CHANGE-POST: -1 %
FEV6FVC-%PRED-PRE: 104 %
FEV6FVC-%Pred-Post: 102 %
FVC-%Change-Post: 4 %
FVC-%PRED-PRE: 78 %
FVC-%Pred-Post: 82 %
FVC-Post: 2.09 L
FVC-Pre: 2 L
PRE FEV1/FVC RATIO: 94 %
PRE FEV6/FVC RATIO: 100 %
Post FEV1/FVC ratio: 89 %
Post FEV6/FVC ratio: 98 %

## 2014-02-14 MED ORDER — HYDROCODONE-HOMATROPINE 5-1.5 MG/5ML PO SYRP: 5.0000 mL | ORAL_SOLUTION | Freq: Four times a day (QID) | ORAL | Status: DC | PRN

## 2014-02-14 NOTE — Patient Instructions (Signed)
You breathing tests do not show evidence for asthma Please continue your Nexium 40mg  twice a day and your pepcid 20 mg at night Continue your fluticasone nasal spray, 2 sprays each side twice a day  Continue loratadine 10mg  daily Use chlorpheniramine 2 pills at night Continue montelukast 10mg  each evening.  Start using nasal saline rinses of your sinuses once a day.  Follow with Dr Lamonte Sakai in 6 weeks or sooner if you have any problems

## 2014-02-14 NOTE — Progress Notes (Signed)
Subjective:    Patient ID: Christy May, female    DOB: 1950/12/02, 63 y.o.   MRN: [id number]  HPI 63 yo woman, former smoker with obesity, OSA, HTN on irbesartan, DM, childhood asthma. Carries a dx of adult asthma made several years ago. Referred for cough. She describes cough that has been prod of white phlegm, started after URI sx about 4 months ago. Describes a tickle in her throat. Can result in stress incontinence.   She has been on Advair in the past. Was recently changed to QVAR + albuterol, uses about 2-3 x a month. She is on omeprazole 20mg  twice a day. She still has occasional reflux sx > can reflux all the way up to throat. She has allergy and rhinitis sx frequently - singulair has been added recently and has helped her.   CXR 12/22/13 - normal.  We will perform full pulmonary function testing  We will change your omeprazole to nexium 40mg  twice a day until our next visit Continue your singulair Stop QVAR for now Keep albuterol available to use 2 puffs if needed for shortness of breath Start loratadine (Claritin) 10mg  daily Start fluticasone nasal spray, 2 sprays each nostril daily Follow with Dr Christy May in 1 month   Acute OV 02/01/14 --  Returns for persistent cough. Seen 1 month ago. Seen last month for pulmonary consult for cough.  She is quite upset that her cough is no better and she can not go anywhere due to stress incontinence w/ coughing fits. Has been going on for ~5 months  On Lopressor 75mg  Twice daily   CXR w/ no acute process 12/22/13 .  Former smoker, PFT pending.  Last ov was changed from prilosec to nexium for better GERD control. She continues to have intermittent reflux.  QVAR was held due to no improvement in symptoms. Previous on Advair in past without help.  No fever, orthopnea, edema , n/v/d, discolored mucus. Does have some sinus drainage.  Has used tussionex with some help, but is out of this.   ROV 02/14/14 -- follows for cough, GERD, PND, suspected  asthma. I changed omeprazole to nexium, added loratdine, flonase. TP added pepcid, chlorpheniramine. Her cough has gotten better but has not gone away completely. She is still having . She has been dx w sleep apnea - she has not been able to tolerate CPAP.   PFT done today 02/14/14 > shows some spiro evidence for restriction but for the most part normal. Could not do volumes.     Review of Systems     Objective:   Physical Exam Filed Vitals:   02/14/14 1620  BP: 134/70  Pulse: 89  Height: 5\' 4"  (1.626 m)  Weight: 311 lb (141.069 kg)  SpO2: 97%    GEN: A/Ox3; pleasant , NAD, obese   HEENT:  [place]/AT,  EACs-clear, TMs-wnl, NOSE-clear, THROAT-clear, no lesions, no postnasal drip or exudate noted.   NECK:  Supple w/ fair ROM; no JVD; normal carotid impulses w/o bruits; no thyromegaly or nodules palpated; no lymphadenopathy.  RESP  Clear  P & A; w/o, wheezes/ rales/ or rhonchi. no dullness to percussion  CARD:  RRR, no m/r/g  , tr peripheral edema, pulses intact, no cyanosis or clubbing.  Musco: Warm bil, no deformities or joint swelling noted.   Neuro: alert, no focal deficits noted.    Skin: Warm, no lesions or rashes      Assessment & Plan:  Cough Her PFT do not show any strong  evidence for AFL, there is probably restriction but she couldn't do lung volumes. I believe that PND and GERD are driving the cough - it has been very debilitating and causing incontinence. Will try to maximize rx for the above.

## 2014-02-14 NOTE — Assessment & Plan Note (Signed)
Her PFT do not show any strong evidence for AFL, there is probably restriction but she couldn't do lung volumes. I believe that PND and GERD are driving the cough - it has been very debilitating and causing incontinence. Will try to maximize rx for the above.

## 2014-02-14 NOTE — Progress Notes (Signed)
PFT done today. 

## 2014-02-16 ENCOUNTER — Encounter: Payer: Self-pay | Admitting: Endocrinology

## 2014-02-16 ENCOUNTER — Ambulatory Visit (INDEPENDENT_AMBULATORY_CARE_PROVIDER_SITE_OTHER): Payer: Medicare Other | Admitting: Endocrinology

## 2014-02-16 ENCOUNTER — Telehealth: Payer: Self-pay

## 2014-02-16 ENCOUNTER — Other Ambulatory Visit: Payer: Self-pay | Admitting: Internal Medicine

## 2014-02-16 VITALS — BP 124/66 | HR 91 | Temp 97.6°F | Ht 64.0 in | Wt 332.0 lb

## 2014-02-16 DIAGNOSIS — E1049 Type 1 diabetes mellitus with other diabetic neurological complication: Secondary | ICD-10-CM

## 2014-02-16 DIAGNOSIS — E1065 Type 1 diabetes mellitus with hyperglycemia: Principal | ICD-10-CM

## 2014-02-16 LAB — HEMOGLOBIN A1C: Hgb A1c MFr Bld: 8.1 % — ABNORMAL HIGH (ref 4.6–6.5)

## 2014-02-16 NOTE — Telephone Encounter (Signed)
Diabetic Bundle. Lvom for pt to call back and schedule appointment with Dr. [name].  

## 2014-02-16 NOTE — Patient Instructions (Addendum)
Please come back for a follow-up appointment in 2 months.   blood tests are being requested for you today.  We'll contact you with results. check your blood sugar twice a day.  vary the time of day when you check, between before the 3 meals, and at bedtime.  also check if you have symptoms of your blood sugar being too high or too low.  please keep a record of the readings and bring it to your next appointment here.  please call us sooner if your blood sugar goes below 70, or if you have a lot of readings over 200.   On this type of insulin schedule, you should eat meals on a regular schedule.  If a meal is missed or significantly delayed, your blood sugar could go low.

## 2014-02-16 NOTE — Progress Notes (Signed)
Subjective:    Patient ID: Christy May, female    DOB: [birth year], 63 y.o.   MRN: [id number]  HPI Pt returns for f/u of DM (dx'ed approx 1985, when she presented with vaginitis; no known chronic complications; she has been on insulin since 2002; in 2011, she was changed to a simpler qd insulin regimen, after poor results on multiple daily injections; she wants the cheapest possible insulin, but she did poorly on NPH; she has never had pancreatitis, severe hypoglycemia or DKA; she declined weight-loss surgery).  she has had no steroids recently.  she brings a record of her cbg's which i have reviewed today.  It varies from 61-250.  It is in general higher as the day goes on.   Past Medical History  Diagnosis Date  . Anemia, unspecified   . Extrinsic asthma, unspecified   . Chronic airway obstruction, not elsewhere classified   . Spondylosis of unspecified site without mention of myelopathy   . Dehydration   . Depressive disorder, not elsewhere classified   . Type II or unspecified type diabetes mellitus without mention of complication, not stated as uncontrolled   . Esophageal reflux   . Unspecified essential hypertension   . Unspecified menopausal and postmenopausal disorder   . Obesity, unspecified   . Inflammatory and toxic neuropathy, unspecified   . Unspecified sleep apnea   . Unspecified vitamin D deficiency   . Dysphagia 2007    historyof dysphagia with severe dysmotility by barium swallow-[name]  . Obesity   . Hypertension   . Diabetes mellitus without complication   . Allergic bronchitis   . Sleep apnea   . Gout 01/04/2013    Past Surgical History  Procedure Laterality Date  . Laser sugery      bilateral eyes  . Back surgery      fusion-multiple cervical spine levels-Dr. Arnoldo Morale  . Excision of abcess      ?? Chest  . Dilation and curettage of uterus    . Exploratory laparotomy    . Partial hysterectomy    . Total knee arthroplasty  2005    Left  .  Total knee arthroplasty  2005    Right- [name]  . Foot surgery      Right X 2  . Cholecystectomy    . Appendectomy    . Tonsillectomy    . Abdominal hysterectomy      History   Social History  . Marital Status: Widowed    Spouse Name: N/A    Number of Children: N/A  . Years of Education: N/A   Occupational History  . disabled    Social History Main Topics  . Smoking status: Former Smoker    Quit date: 06/22/1986  . Smokeless tobacco: Never Used  . Alcohol Use: No  . Drug Use: No  . Sexual Activity: Not on file   Other Topics Concern  . Not on file   Social History Narrative   ** Merged History Encounter **       Patient does not get regular exercise.      Widowed 2004      Disabled    Current Outpatient Prescriptions on File Prior to Visit  Medication Sig Dispense Refill  . albuterol (PROVENTIL HFA;VENTOLIN HFA) 108 (90 BASE) MCG/ACT inhaler Inhale 2 puffs into the lungs every 6 (six) hours as needed for wheezing or shortness of breath.  1 Inhaler  1  . aspirin 81 MG tablet Take 81 mg  by mouth daily.        . beclomethasone (QVAR) 80 MCG/ACT inhaler Inhale 2 puffs into the lungs 2 (two) times daily.  1 Inhaler  12  . cholecalciferol (VITAMIN D) 1000 UNITS tablet Take 2,000 Units by mouth daily.        . cyclobenzaprine (FLEXERIL) 5 MG tablet TAKE 1 TABLET BY MOUTH THREE TIMES DAILY AS NEEDED FOR MUSCLE SPASMS  90 tablet  0  . esomeprazole (NEXIUM) 40 MG capsule Take 1 capsule (40 mg total) by mouth 2 (two) times daily.  60 capsule  3  . fluticasone (FLONASE) 50 MCG/ACT nasal spray Place 2 sprays into both nostrils daily.  16 g  2  . gabapentin (NEURONTIN) 800 MG tablet TAKE 1 TABLET BY MOUTH THREE TIMES DAILY  270 tablet  1  . glucose blood (ONE TOUCH ULTRA TEST) test strip Use to test blood sugars twice a day. Dx 250.63  100 each  12  . HYDROcodone-homatropine (HYDROMET) 5-1.5 MG/5ML syrup Take 5 mLs by mouth every 6 (six) hours as needed.  240 mL  0  .  irbesartan (AVAPRO) 300 MG tablet Take 300 mg by mouth daily.      . Lancets (ONETOUCH ULTRASOFT) lancets Use to test blood sugars twice a day.  100 each  12  . metoprolol (LOPRESSOR) 50 MG tablet TAKE 1& 1/2 TABLETS TWICE DAILY  270 tablet  3  . montelukast (SINGULAIR) 10 MG tablet Take 1 tablet (10 mg total) by mouth daily.  30 tablet  11  . omeprazole (PRILOSEC) 20 MG capsule TAKE ONE CAPSULE BY MOUTH TWICE DAILY  180 capsule  3  . oxybutynin (DITROPAN-XL) 10 MG 24 hr tablet Take 1 tablet (10 mg total) by mouth daily.  90 tablet  3  . promethazine (PHENERGAN) 25 MG tablet TAKE 1 TABLET BY MOUTH EVERY 6 HOURS AS NEEDED FOR NAUSEA  30 tablet  0  . albuterol (PROVENTIL) (2.5 MG/3ML) 0.083% nebulizer solution Take 3 mLs (2.5 mg total) by nebulization every 6 (six) hours as needed. Respiratory distress  75 mL  11  . diclofenac sodium (VOLTAREN) 1 % GEL Apply 4 g topically 4 (four) times daily. As needed to affected area  100 g  5  . nystatin cream (MYCOSTATIN) Apply to affected area 2 times daily  30 g  0   No current facility-administered medications on file prior to visit.    Allergies  Allergen Reactions  . Ace Inhibitors Anaphylaxis  . Penicillins Hives and Rash  . Tape Hives    Paper tape is ok to use  . Adhesive [Tape] Rash    Family History  Problem Relation Age of Onset  . Esophageal cancer Brother   . Esophageal cancer Sister     ?  . Colon polyps Brother   . Pancreatic cancer Sister     ?  . Diabetes Sister   . Diabetes      Aunt and Uncle  . Heart disease Maternal Grandfather     BP 124/66  Pulse 91  Temp(Src) 97.6 F (36.4 C) (Oral)  Ht 5\' 4"  (1.626 m)  Wt 332 lb (150.594 kg)  BMI 56.96 kg/m2  SpO2 94%    Review of Systems Denies LOC and weight change    Objective:   Physical Exam VITAL SIGNS:  See vs page GENERAL: no distress Pulses: dorsalis pedis intact bilat.  Feet: no deformity. feet are of normal color and temp. 1+ bilat leg edema  Skin:  no  ulcer on the feet.  Neuro: sensation is intact to touch on the feet, but decreased from normal  Lab Results  Component Value Date   HGBA1C 8.1* 02/16/2014   i reviewed electrocardiogram: 1/15      Assessment & Plan:  DM: moderate exacerbation. COPD: improved.  She does not needs steroids now--this helps glycemic control.      Patient is advised the following: Patient Instructions  Please come back for a follow-up appointment in 2 months.   blood tests are being requested for you today.  We'll contact you with results. check your blood sugar twice a day.  vary the time of day when you check, between before the 3 meals, and at bedtime.  also check if you have symptoms of your blood sugar being too high or too low.  please keep a record of the readings and bring it to your next appointment here.  please call us sooner if your blood sugar goes below 70, or if you have a lot of readings over 200.   On this type of insulin schedule, you should eat meals on a regular schedule.  If a meal is missed or significantly delayed, your blood sugar could go low.     Please increase the insulin to 275 units each morning.

## 2014-02-22 ENCOUNTER — Encounter: Payer: Self-pay | Admitting: Internal Medicine

## 2014-02-22 ENCOUNTER — Ambulatory Visit (INDEPENDENT_AMBULATORY_CARE_PROVIDER_SITE_OTHER): Payer: Medicare Other | Admitting: Internal Medicine

## 2014-02-22 VITALS — BP 120/72 | HR 99 | Temp 98.7°F | Wt 332.0 lb

## 2014-02-22 DIAGNOSIS — J449 Chronic obstructive pulmonary disease, unspecified: Secondary | ICD-10-CM | POA: Diagnosis not present

## 2014-02-22 DIAGNOSIS — H526 Other disorders of refraction: Secondary | ICD-10-CM | POA: Diagnosis not present

## 2014-02-22 DIAGNOSIS — E11339 Type 2 diabetes mellitus with moderate nonproliferative diabetic retinopathy without macular edema: Secondary | ICD-10-CM | POA: Diagnosis not present

## 2014-02-22 DIAGNOSIS — J4489 Other specified chronic obstructive pulmonary disease: Secondary | ICD-10-CM

## 2014-02-22 DIAGNOSIS — J209 Acute bronchitis, unspecified: Secondary | ICD-10-CM | POA: Diagnosis not present

## 2014-02-22 DIAGNOSIS — H2589 Other age-related cataract: Secondary | ICD-10-CM | POA: Diagnosis not present

## 2014-02-22 DIAGNOSIS — E11311 Type 2 diabetes mellitus with unspecified diabetic retinopathy with macular edema: Secondary | ICD-10-CM | POA: Diagnosis not present

## 2014-02-22 DIAGNOSIS — I10 Essential (primary) hypertension: Secondary | ICD-10-CM

## 2014-02-22 DIAGNOSIS — E1039 Type 1 diabetes mellitus with other diabetic ophthalmic complication: Secondary | ICD-10-CM | POA: Diagnosis not present

## 2014-02-22 MED ORDER — BENZONATATE 100 MG PO CAPS: ORAL_CAPSULE | ORAL | Status: DC

## 2014-02-22 MED ORDER — LEVOFLOXACIN 250 MG PO TABS: 250.0000 mg | ORAL_TABLET | Freq: Every day | ORAL | Status: DC

## 2014-02-22 MED ORDER — PREDNISONE 10 MG PO TABS: ORAL_TABLET | ORAL | Status: DC

## 2014-02-22 NOTE — Progress Notes (Signed)
Subjective:    Patient ID: Christy May, female    DOB: [birth year], 63 y.o.   MRN: [id number]  HPI  Here with acute onset mild to mod 2-3 days ST, HA, general weakness and malaise, with prod cough greenish sputum, but Pt denies chest pain, increased sob or doe, wheezing, orthopnea, PND, increased LE swelling, palpitations, dizziness or syncope. Pt denies new neurological symptoms such as new headache, or facial or extremity weakness or numbness Past Medical History  Diagnosis Date  . Anemia, unspecified   . Extrinsic asthma, unspecified   . Chronic airway obstruction, not elsewhere classified   . Spondylosis of unspecified site without mention of myelopathy   . Dehydration   . Depressive disorder, not elsewhere classified   . Type II or unspecified type diabetes mellitus without mention of complication, not stated as uncontrolled   . Esophageal reflux   . Unspecified essential hypertension   . Unspecified menopausal and postmenopausal disorder   . Obesity, unspecified   . Inflammatory and toxic neuropathy, unspecified   . Unspecified sleep apnea   . Unspecified vitamin D deficiency   . Dysphagia 2007    historyof dysphagia with severe dysmotility by barium swallow-[name]  . Obesity   . Hypertension   . Diabetes mellitus without complication   . Allergic bronchitis   . Sleep apnea   . Gout 01/04/2013   Past Surgical History  Procedure Laterality Date  . Laser sugery      bilateral eyes  . Back surgery      fusion-multiple cervical spine levels-Dr. Arnoldo Morale  . Excision of abcess      ?? Chest  . Dilation and curettage of uterus    . Exploratory laparotomy    . Partial hysterectomy    . Total knee arthroplasty  2005    Left  . Total knee arthroplasty  2005    Right- [name]  . Foot surgery      Right X 2  . Cholecystectomy    . Appendectomy    . Tonsillectomy    . Abdominal hysterectomy      reports that she quit smoking about 27 years ago. She has never  used smokeless tobacco. She reports that she does not drink alcohol or use illicit drugs. family history includes Colon polyps in her brother; Diabetes in her sister and another family member; Esophageal cancer in her brother and sister; Heart disease in her maternal grandfather; Pancreatic cancer in her sister. Allergies  Allergen Reactions  . Ace Inhibitors Anaphylaxis  . Penicillins Hives and Rash  . Tape Hives    Paper tape is ok to use  . Adhesive [Tape] Rash   Current Outpatient Prescriptions on File Prior to Visit  Medication Sig Dispense Refill  . albuterol (PROVENTIL) (2.5 MG/3ML) 0.083% nebulizer solution Take 3 mLs (2.5 mg total) by nebulization every 6 (six) hours as needed. Respiratory distress  75 mL  11  . aspirin 81 MG tablet Take 81 mg by mouth daily.        . cholecalciferol (VITAMIN D) 1000 UNITS tablet Take 2,000 Units by mouth daily.        . cyclobenzaprine (FLEXERIL) 5 MG tablet TAKE 1 TABLET BY MOUTH THREE TIMES DAILY AS NEEDED FOR MUSCLE SPASMS  90 tablet  0  . diclofenac sodium (VOLTAREN) 1 % GEL Apply 4 g topically 4 (four) times daily. As needed to affected area  100 g  5  . esomeprazole (NEXIUM) 40 MG capsule  Take 1 capsule (40 mg total) by mouth 2 (two) times daily.  60 capsule  3  . fluticasone (FLONASE) 50 MCG/ACT nasal spray Place 2 sprays into both nostrils daily.  16 g  2  . gabapentin (NEURONTIN) 800 MG tablet TAKE 1 TABLET BY MOUTH THREE TIMES DAILY  270 tablet  1  . glucose blood (ONE TOUCH ULTRA TEST) test strip Use to test blood sugars twice a day. Dx 250.63  100 each  12  . Insulin Glargine (LANTUS) 100 UNIT/ML Solostar Pen Inject 275 Units into the skin every morning. And pen needles 2/day      . irbesartan (AVAPRO) 300 MG tablet Take 300 mg by mouth daily.      . Lancets (ONETOUCH ULTRASOFT) lancets Use to test blood sugars twice a day.  100 each  12  . metoprolol (LOPRESSOR) 50 MG tablet TAKE 1& 1/2 TABLETS TWICE DAILY  270 tablet  3  . montelukast  (SINGULAIR) 10 MG tablet Take 1 tablet (10 mg total) by mouth daily.  30 tablet  11  . nystatin cream (MYCOSTATIN) Apply to affected area 2 times daily  30 g  0  . oxybutynin (DITROPAN-XL) 10 MG 24 hr tablet Take 1 tablet (10 mg total) by mouth daily.  90 tablet  3  . prednisoLONE sodium phosphate (INFLAMASE FORTE) 1 % ophthalmic solution 1 drop 4 (four) times daily.      . promethazine (PHENERGAN) 25 MG tablet TAKE 1 TABLET BY MOUTH EVERY 6 HOURS AS NEEDED FOR NAUSEA  30 tablet  0  . HYDROcodone-homatropine (HYDROMET) 5-1.5 MG/5ML syrup Take 5 mLs by mouth every 6 (six) hours as needed.  240 mL  0   No current facility-administered medications on file prior to visit.   Review of Systems  Constitutional: Negative for unusual diaphoresis or other sweats  HENT: Negative for ringing in ear Eyes: Negative for double vision or worsening visual disturbance.  Respiratory: Negative for choking and stridor.   Gastrointestinal: Negative for vomiting or other signifcant bowel change Genitourinary: Negative for hematuria or decreased urine volume.  Musculoskeletal: Negative for other MSK pain or swelling Skin: Negative for color change and worsening wound.  Neurological: Negative for tremors and numbness other than noted  Psychiatric/Behavioral: Negative for decreased concentration or agitation other than above       Objective:   Physical Exam BP 120/72  Pulse 99  Temp(Src) 98.7 F (37.1 C) (Oral)  Wt 332 lb (150.594 kg)  SpO2 98% VS noted,  Constitutional: Pt appears well-developed, well-nourished.  HENT: Head: NCAT.  Right Ear: External ear normal.  Left Ear: External ear normal.  Bilat tm's with mild erythema.  Max sinus areas non tender.  Pharynx with mild erythema, no exudate Eyes: . Pupils are equal, round, and reactive to light. Conjunctivae and EOM are normal Neck: Normal range of motion. Neck supple.  Cardiovascular: Normal rate and regular rhythm.   Pulmonary/Chest: Effort normal  and breath sounds normal - no rales or wheezing.  Neurological: Pt is alert. Not confused , motor grossly intact Skin: Skin is warm. No rash Psychiatric: Pt behavior is normal. No agitation.     Assessment & Plan:

## 2014-02-22 NOTE — Progress Notes (Signed)
Pre visit review using our clinic review tool, if applicable. No additional management support is needed unless otherwise documented below in the visit note. 

## 2014-02-22 NOTE — Patient Instructions (Signed)
Please take all new medication as prescribed - the antibiotic, cough pills if needed, and prednisone  Please continue all other medications as before, and refills have been done if requested.  Please have the pharmacy call with any other refills you may need.  Please keep your appointments with your specialists as you may have planned    

## 2014-02-24 NOTE — Assessment & Plan Note (Signed)
Mild to mod, for antibx course,  to f/u any worsening symptoms or concerns 

## 2014-02-24 NOTE — Assessment & Plan Note (Signed)
stable overall by history and exam, recent data reviewed with pt, and pt to continue medical treatment as before,  to f/u any worsening symptoms or concerns SpO2 Readings from Last 3 Encounters:  02/22/14 98%  02/16/14 94%  02/14/14 97%

## 2014-02-24 NOTE — Assessment & Plan Note (Signed)
stable overall by history and exam, recent data reviewed with pt, and pt to continue medical treatment as before,  to f/u any worsening symptoms or concerns BP Readings from Last 3 Encounters:  02/22/14 120/72  02/16/14 124/66  02/14/14 134/70

## 2014-02-27 ENCOUNTER — Telehealth: Payer: Self-pay | Admitting: Emergency Medicine

## 2014-02-27 ENCOUNTER — Telehealth: Payer: Self-pay | Admitting: Internal Medicine

## 2014-02-27 NOTE — Telephone Encounter (Signed)
Can also try Delsym otc prn cough

## 2014-02-27 NOTE — Telephone Encounter (Signed)
Called the patient no answer and no voice mail to leave a message. 

## 2014-02-27 NOTE — Telephone Encounter (Signed)
Called spoke with pt. She report she is being treated by Dr. Jenny Reichmann w/ ABX and prednisone. She feels she is getting better although a few times she has had accidents in her pants. i offered pt an appt to be seen here but reports she will hold off for now. FYI for RB

## 2014-02-27 NOTE — Telephone Encounter (Signed)
Pt request phone call from the assistant about her coughing and sleepless at night. Pt would like to speak to the assistant about this. Please give her a call.

## 2014-02-27 NOTE — Telephone Encounter (Signed)
Spoke to the patient and she is coughing both day and night, but nights are worse and she is getting no sleep and having problems when she coughs she has loose stool.  Please advise

## 2014-02-28 NOTE — Telephone Encounter (Signed)
Called the patient left a detailed message of MD instructions.

## 2014-03-05 ENCOUNTER — Ambulatory Visit: Payer: Medicare Other | Admitting: *Deleted

## 2014-03-07 ENCOUNTER — Other Ambulatory Visit: Payer: Self-pay | Admitting: Internal Medicine

## 2014-03-08 ENCOUNTER — Other Ambulatory Visit: Payer: Medicare Other

## 2014-03-12 ENCOUNTER — Telehealth: Payer: Self-pay | Admitting: *Deleted

## 2014-03-12 ENCOUNTER — Telehealth: Payer: Self-pay | Admitting: Emergency Medicine

## 2014-03-12 NOTE — Telephone Encounter (Signed)
I'm checking to see if my Diabetic shoes have arrived.  Thank you so much for giving me a call.  You have a blessed day.

## 2014-03-12 NOTE — Telephone Encounter (Signed)
Per 02/14/14: Patient Instructions      You breathing tests do not show evidence for asthma Please continue your Nexium 40mg  twice a day and your pepcid 20 mg at night Continue your fluticasone nasal spray, 2 sprays each side twice a day   Continue loratadine 10mg  daily Use chlorpheniramine 2 pills at night Continue montelukast 10mg  each evening.   Start using nasal saline rinses of your sinuses once a day.  Follow with Dr Lamonte Sakai in 6 weeks or sooner if you have any   --  Called spoke with pt. She is requesting refill on hydromet cough syrup.  Last refilled 02/14/14 #240 ml's. Pt reports she is still coughing.  Pt scheduled appt to see RB tomorrow at 11:30. Nothing further needed

## 2014-03-13 ENCOUNTER — Ambulatory Visit (INDEPENDENT_AMBULATORY_CARE_PROVIDER_SITE_OTHER): Payer: Medicare Other | Admitting: Emergency Medicine

## 2014-03-13 ENCOUNTER — Encounter: Payer: Self-pay | Admitting: Emergency Medicine

## 2014-03-13 VITALS — BP 130/80 | HR 107 | Temp 97.7°F | Ht 64.0 in | Wt 330.6 lb

## 2014-03-13 DIAGNOSIS — R05 Cough: Secondary | ICD-10-CM | POA: Diagnosis not present

## 2014-03-13 DIAGNOSIS — R059 Cough, unspecified: Secondary | ICD-10-CM

## 2014-03-13 DIAGNOSIS — G4730 Sleep apnea, unspecified: Secondary | ICD-10-CM

## 2014-03-13 MED ORDER — FLUTICASONE PROPIONATE 50 MCG/ACT NA SUSP: 2.0000 | Freq: Every day | NASAL | Status: DC

## 2014-03-13 MED ORDER — FAMOTIDINE 20 MG PO TABS: 20.0000 mg | ORAL_TABLET | Freq: Every day | ORAL | Status: DC

## 2014-03-13 MED ORDER — HYDROCODONE-HOMATROPINE 5-1.5 MG/5ML PO SYRP: 5.0000 mL | ORAL_SOLUTION | Freq: Four times a day (QID) | ORAL | Status: DC | PRN

## 2014-03-13 MED ORDER — LORATADINE 10 MG PO TABS: 10.0000 mg | ORAL_TABLET | Freq: Every day | ORAL | Status: DC

## 2014-03-13 MED ORDER — ESOMEPRAZOLE MAGNESIUM 40 MG PO CPDR: 40.0000 mg | DELAYED_RELEASE_CAPSULE | Freq: Two times a day (BID) | ORAL | Status: DC

## 2014-03-13 MED ORDER — MONTELUKAST SODIUM 10 MG PO TABS: 10.0000 mg | ORAL_TABLET | Freq: Every day | ORAL | Status: DC

## 2014-03-13 NOTE — Assessment & Plan Note (Signed)
Without evidence for asthma or COPD. Her cough is driven by GERD and rhinitis. Have been trying to aggressively treat both. Treatment of her OSA might help indirectly as well. Using hycodan for suppression.   Please continue your nexium 40mg  twice a day, pepcid 20mg  at night Please continue to take fluticasone nasal spray. Take 2 sprays each nostril twice a day Please continue your loratadine (Claritin) 10mg  daily.  Please continue Singulair 10mg  each evening Start taking over-the-counter chlorpheniramine 4mg  up to every 6 hours for your coughing Take Hycodan as directed for cough suppression.  You may benefit from speaking to Dr Janice Norrie again about any strategies to help with urinary incontinence. Cough suppression should help this the most.  Follow with Dr Lamonte Sakai in 3 months or sooner if you have any problems.

## 2014-03-13 NOTE — Patient Instructions (Signed)
Please continue your nexium 40mg  twice a day, pepcid 20mg  at night Please continue to take fluticasone nasal spray. Take 2 sprays each nostril twice a day Please continue your loratadine (Claritin) 10mg  daily.  Please continue Singulair 10mg  each evening Start taking over-the-counter chlorpheniramine 4mg  up to every 6 hours for your coughing Take Hycodan as directed for cough suppression.  You may benefit from speaking to Dr Janice Norrie again about any strategies to help with urinary incontinence. Cough suppression should help this the most.  Follow with Dr Lamonte Sakai in 3 months or sooner if you have any problems.

## 2014-03-13 NOTE — Addendum Note (Signed)
Addended by: Virl Cagey on: 03/13/2014 12:18 PM   Modules accepted: Orders

## 2014-03-13 NOTE — Progress Notes (Signed)
Subjective:    Patient ID: Christy May, female    DOB: 07-Sep-1950, 63 y.o.   MRN: [id number]  HPI 63 yo woman, former smoker with obesity, OSA, HTN on irbesartan, DM, childhood asthma. Carries a dx of adult asthma made several years ago. Referred for cough. She describes cough that has been prod of white phlegm, started after URI sx about 4 months ago. Describes a tickle in her throat. Can result in stress incontinence.   She has been on Advair in the past. Was recently changed to QVAR + albuterol, uses about 2-3 x a month. She is on omeprazole 20mg  twice a day. She still has occasional reflux sx > can reflux all the way up to throat. She has allergy and rhinitis sx frequently - singulair has been added recently and has helped her.   CXR 12/22/13 - normal.  We will perform full pulmonary function testing  We will change your omeprazole to nexium 40mg  twice a day until our next visit Continue your singulair Stop QVAR for now Keep albuterol available to use 2 puffs if needed for shortness of breath Start loratadine (Claritin) 10mg  daily Start fluticasone nasal spray, 2 sprays each nostril daily Follow with Dr Lamonte Sakai in 1 month   Acute OV 02/01/14 --  Returns for persistent cough. Seen 1 month ago. Seen last month for pulmonary consult for cough.  She is quite upset that her cough is no better and she can not go anywhere due to stress incontinence w/ coughing fits. Has been going on for ~5 months  On Lopressor 75mg  Twice daily   CXR w/ no acute process 12/22/13 .  Former smoker, PFT pending.  Last ov was changed from prilosec to nexium for better GERD control. She continues to have intermittent reflux.  QVAR was held due to no improvement in symptoms. Previous on Advair in past without help.  No fever, orthopnea, edema , n/v/d, discolored mucus. Does have some sinus drainage.  Has used tussionex with some help, but is out of this.   ROV 02/14/14 -- follows for cough, GERD, PND, suspected  asthma. I changed omeprazole to nexium, added loratdine, flonase. TP added pepcid, chlorpheniramine. Her cough has gotten better but has not gone away completely. She is still having. She has been dx w sleep apnea - she has not been able to tolerate CPAP.   PFT done today 02/14/14 > shows some spiro evidence for restriction but for the most part normal. Could not do volumes.    ROV 03/13/14 -- follow up for chronic cough, influenced by GERD and rhinitis. Her PFT are reassuring > little evidence for asthma. Treated recently by Dr Jenny Reichmann for a possible bronchitis and flare. Temporarily improved but then cough right back to where she started.  She is on high dose nexium, flonase, singulair, pepcid qhs.  She is on loratadine qd, not on chlorpheniramine. She is having some nasal congestion. She still has some mild GERD sx.    Review of Systems     Objective:   Physical Exam Filed Vitals:   03/13/14 1136  BP: 130/80  Pulse: 107  Temp: 97.7 F (36.5 C)  TempSrc: Oral  Height: 5\' 4"  (1.626 m)  Weight: 330 lb 9.6 oz (149.959 kg)  SpO2: 95%    GEN: A/Ox3; pleasant , NAD, obese   HEENT:  [place]/AT,  EACs-clear, TMs-wnl, NOSE-clear, THROAT-clear, no lesions, no postnasal drip or exudate noted. Hoarse voice   NECK:  Supple w/ fair ROM; no  JVD; normal carotid impulses w/o bruits; no thyromegaly or nodules palpated; no lymphadenopathy.  RESP  Clear  P & A; w/o, wheezes/ rales/ or rhonchi.  CARD:  RRR, no m/r/g  , tr peripheral edema, pulses intact, no cyanosis or clubbing.  Musco: Warm bil, no deformities or joint swelling noted.   Neuro: alert, no focal deficits noted.    Skin: Warm, no lesions or rashes      Assessment & Plan:  SLEEP APNEA Untreated. Probably an indirect contributor to cough due to UA irritation and snoring. Has been unable to wear CPAP  Cough Without evidence for asthma or COPD. Her cough is driven by GERD and rhinitis. Have been trying to aggressively treat both.  Treatment of her OSA might help indirectly as well. Using hycodan for suppression.   Please continue your nexium 40mg  twice a day, pepcid 20mg  at night Please continue to take fluticasone nasal spray. Take 2 sprays each nostril twice a day Please continue your loratadine (Claritin) 10mg  daily.  Please continue Singulair 10mg  each evening Start taking over-the-counter chlorpheniramine 4mg  up to every 6 hours for your coughing Take Hycodan as directed for cough suppression.  You may benefit from speaking to Dr Janice Norrie again about any strategies to help with urinary incontinence. Cough suppression should help this the most.  Follow with Dr Lamonte Sakai in 3 months or sooner if you have any problems.

## 2014-03-13 NOTE — Assessment & Plan Note (Addendum)
Untreated. Probably an indirect contributor to cough due to UA irritation and snoring. Has been unable to wear CPAP

## 2014-03-18 ENCOUNTER — Other Ambulatory Visit: Payer: Self-pay | Admitting: Internal Medicine

## 2014-03-20 ENCOUNTER — Encounter (HOSPITAL_COMMUNITY): Payer: Self-pay | Admitting: Emergency Medicine

## 2014-03-20 ENCOUNTER — Emergency Department (HOSPITAL_COMMUNITY)
Admission: EM | Admit: 2014-03-20 | Discharge: 2014-03-20 | Disposition: A | Discharge status: Home / Self Care | Payer: Medicare Other | Attending: Emergency Medicine | Admitting: Emergency Medicine

## 2014-03-20 DIAGNOSIS — K219 Gastro-esophageal reflux disease without esophagitis: Secondary | ICD-10-CM | POA: Insufficient documentation

## 2014-03-20 DIAGNOSIS — Z880 Allergy status to penicillin: Secondary | ICD-10-CM | POA: Insufficient documentation

## 2014-03-20 DIAGNOSIS — I10 Essential (primary) hypertension: Secondary | ICD-10-CM | POA: Diagnosis not present

## 2014-03-20 DIAGNOSIS — Z794 Long term (current) use of insulin: Secondary | ICD-10-CM | POA: Diagnosis not present

## 2014-03-20 DIAGNOSIS — F329 Major depressive disorder, single episode, unspecified: Secondary | ICD-10-CM | POA: Diagnosis not present

## 2014-03-20 DIAGNOSIS — N6489 Other specified disorders of breast: Secondary | ICD-10-CM | POA: Diagnosis not present

## 2014-03-20 DIAGNOSIS — E559 Vitamin D deficiency, unspecified: Secondary | ICD-10-CM | POA: Insufficient documentation

## 2014-03-20 DIAGNOSIS — F3289 Other specified depressive episodes: Secondary | ICD-10-CM | POA: Insufficient documentation

## 2014-03-20 DIAGNOSIS — N644 Mastodynia: Secondary | ICD-10-CM | POA: Diagnosis not present

## 2014-03-20 DIAGNOSIS — J4489 Other specified chronic obstructive pulmonary disease: Secondary | ICD-10-CM | POA: Insufficient documentation

## 2014-03-20 DIAGNOSIS — Z79899 Other long term (current) drug therapy: Secondary | ICD-10-CM | POA: Diagnosis not present

## 2014-03-20 DIAGNOSIS — Z8742 Personal history of other diseases of the female genital tract: Secondary | ICD-10-CM | POA: Insufficient documentation

## 2014-03-20 DIAGNOSIS — IMO0002 Reserved for concepts with insufficient information to code with codable children: Secondary | ICD-10-CM | POA: Insufficient documentation

## 2014-03-20 DIAGNOSIS — Z862 Personal history of diseases of the blood and blood-forming organs and certain disorders involving the immune mechanism: Secondary | ICD-10-CM | POA: Diagnosis not present

## 2014-03-20 DIAGNOSIS — N611 Abscess of the breast and nipple: Secondary | ICD-10-CM

## 2014-03-20 DIAGNOSIS — E669 Obesity, unspecified: Secondary | ICD-10-CM | POA: Diagnosis not present

## 2014-03-20 DIAGNOSIS — N61 Mastitis without abscess: Secondary | ICD-10-CM | POA: Diagnosis not present

## 2014-03-20 DIAGNOSIS — J449 Chronic obstructive pulmonary disease, unspecified: Secondary | ICD-10-CM | POA: Diagnosis not present

## 2014-03-20 DIAGNOSIS — Z792 Long term (current) use of antibiotics: Secondary | ICD-10-CM | POA: Diagnosis not present

## 2014-03-20 DIAGNOSIS — Z7982 Long term (current) use of aspirin: Secondary | ICD-10-CM | POA: Insufficient documentation

## 2014-03-20 DIAGNOSIS — Z87891 Personal history of nicotine dependence: Secondary | ICD-10-CM | POA: Insufficient documentation

## 2014-03-20 DIAGNOSIS — E119 Type 2 diabetes mellitus without complications: Secondary | ICD-10-CM | POA: Diagnosis not present

## 2014-03-20 DIAGNOSIS — Z791 Long term (current) use of non-steroidal anti-inflammatories (NSAID): Secondary | ICD-10-CM | POA: Diagnosis not present

## 2014-03-20 MED ORDER — SULFAMETHOXAZOLE-TMP DS 800-160 MG PO TABS
1.0000 | ORAL_TABLET | Freq: Once | ORAL | Status: AC
  Administered 2014-03-20: 1 via ORAL
  Filled 2014-03-20: qty 1

## 2014-03-20 MED ORDER — SULFAMETHOXAZOLE-TMP DS 800-160 MG PO TABS: 1.0000 | ORAL_TABLET | Freq: Two times a day (BID) | ORAL | Status: DC

## 2014-03-20 NOTE — ED Provider Notes (Signed)
CSN: [id number]     Arrival date & time 03/20/14  1945 History  This chart was scribed for [name] working with Ernestina Patches, MD by Evelene Croon, ED Scribe. This patient was seen in room [place] and the patient's care was started at 9:08 PM.    Chief Complaint  Patient presents with  . Breast Pain     The history is provided by the patient. No language interpreter was used.   HPI Comments:  Christy May is a 63 y.o. female who presents to the Emergency Department complaining of lump under her left breast which she noticed yesterday. She notes area is moderately painful. Pt's last mammogram was this year, she denies concern for CA. No alleviating factors or associated symptoms noted.   Past Medical History  Diagnosis Date  . Anemia, unspecified   . Extrinsic asthma, unspecified   . Chronic airway obstruction, not elsewhere classified   . Spondylosis of unspecified site without mention of myelopathy   . Dehydration   . Depressive disorder, not elsewhere classified   . Type II or unspecified type diabetes mellitus without mention of complication, not stated as uncontrolled   . Esophageal reflux   . Unspecified essential hypertension   . Unspecified menopausal and postmenopausal disorder   . Obesity, unspecified   . Inflammatory and toxic neuropathy, unspecified   . Unspecified sleep apnea   . Unspecified vitamin D deficiency   . Dysphagia 2007    historyof dysphagia with severe dysmotility by barium swallow-[name]  . Obesity   . Hypertension   . Diabetes mellitus without complication   . Allergic bronchitis   . Sleep apnea   . Gout 01/04/2013   Past Surgical History  Procedure Laterality Date  . May sugery      bilateral eyes  . Back surgery      fusion-multiple cervical spine levels-Dr. Arnoldo Morale  . Excision of abcess      ?? Chest  . Dilation and curettage of uterus    . Exploratory laparotomy    . Partial hysterectomy    . Total knee  arthroplasty  2005    Left  . Total knee arthroplasty  2005    Right- [name]  . Foot surgery      Right X 2  . Cholecystectomy    . Appendectomy    . Tonsillectomy    . Abdominal hysterectomy     Family History  Problem Relation Age of Onset  . Esophageal cancer Brother   . Esophageal cancer Sister     ?  . Colon polyps Brother   . Pancreatic cancer Sister     ?  . Diabetes Sister   . Diabetes      Aunt and Uncle  . Heart disease Maternal Grandfather    History  Substance Use Topics  . Smoking status: Former Smoker    Quit date: 06/22/1986  . Smokeless tobacco: Never Used  . Alcohol Use: No   OB History   Grav Para Term Preterm Abortions TAB SAB Ect Mult Living                 Review of Systems  Constitutional: Negative for fever and chills.  Skin: Positive for wound.       Lump on left breast   All other systems reviewed and are negative.     Allergies  Ace inhibitors; Penicillins; Tape; and Adhesive  Home Medications   Prior to Admission medications   Medication  Sig Start Date End Date Taking? Authorizing Provider  albuterol (PROVENTIL) (2.5 MG/3ML) 0.083% nebulizer solution Take 3 mLs (2.5 mg total) by nebulization every 6 (six) hours as needed. Respiratory distress 06/02/13   Biagio Borg, MD  aspirin 81 MG tablet Take 81 mg by mouth daily.      Historical Provider, MD  benzonatate (TESSALON) 100 MG capsule 1-2 tab by mouth every 6 hrs as needed for cough 02/22/14   Biagio Borg, MD  chlorpheniramine (CHLOR-TRIMETON) 4 MG tablet Take 4 mg by mouth as directed.    Historical Provider, MD  cholecalciferol (VITAMIN D) 1000 UNITS tablet Take 2,000 Units by mouth daily.      Historical Provider, MD  cyclobenzaprine (FLEXERIL) 5 MG tablet TAKE 1 TABLET BY MOUTH THREE TIMES DAILY AS NEEDED FOR MUSCLE SPASMS 03/08/14   Biagio Borg, MD  diclofenac sodium (VOLTAREN) 1 % GEL Apply 4 g topically 4 (four) times daily. As needed to affected area 12/30/12   Biagio Borg, MD  esomeprazole (NEXIUM) 40 MG capsule Take 1 capsule (40 mg total) by mouth 2 (two) times daily. 03/13/14   Collene Gobble, MD  famotidine (PEPCID) 20 MG tablet Take 1 tablet (20 mg total) by mouth at bedtime. 03/13/14   Collene Gobble, MD  fluticasone (FLONASE) 50 MCG/ACT nasal spray Place 2 sprays into both nostrils daily. 03/13/14   Collene Gobble, MD  gabapentin (NEURONTIN) 800 MG tablet TAKE 1 TABLET BY MOUTH THREE TIMES DAILY 01/15/14   Biagio Borg, MD  glucose blood (ONE TOUCH ULTRA TEST) test strip Use to test blood sugars twice a day. Dx 250.63 10/06/13   Renato Shin, MD  HYDROcodone-homatropine [facility]) 5-1.5 MG/5ML syrup Take 5 mLs by mouth every 6 (six) hours as needed for cough. 03/13/14   Collene Gobble, MD  Insulin Glargine (LANTUS) 100 UNIT/ML Solostar Pen Inject 275 Units into the skin every morning. And pen needles 2/day 10/24/13   Renato Shin, MD  irbesartan (AVAPRO) 300 MG tablet Take 300 mg by mouth daily.    Historical Provider, MD  Lancets [facility] ULTRASOFT) lancets Use to test blood sugars twice a day. 07/20/13   Renato Shin, MD  loratadine (CLARITIN) 10 MG tablet Take 1 tablet (10 mg total) by mouth daily. 03/13/14   Collene Gobble, MD  metoprolol (LOPRESSOR) 50 MG tablet TAKE 1& 1/2 TABLETS TWICE DAILY 12/06/13   Biagio Borg, MD  montelukast (SINGULAIR) 10 MG tablet Take 1 tablet (10 mg total) by mouth daily. 03/13/14   Collene Gobble, MD  nystatin cream (MYCOSTATIN) Apply to affected area 2 times daily 12/06/13   Biagio Borg, MD  oxybutynin (DITROPAN-XL) 10 MG 24 hr tablet Take 1 tablet (10 mg total) by mouth daily. 08/03/13   Biagio Borg, MD  promethazine (PHENERGAN) 25 MG tablet TAKE 1 TABLET BY MOUTH EVERY 6 HOURS AS NEEDED FOR NAUSEA 03/08/14   Biagio Borg, MD  sulfamethoxazole-trimethoprim (BACTRIM DS) 800-160 MG per tablet Take 1 tablet by mouth 2 (two) times daily. 03/20/14   Garald Balding, NP   BP 150/76  Pulse 101  Temp(Src) 98.4 F (36.9 C) (Oral)  Resp  16  SpO2 94% Physical Exam  Nursing note and vitals reviewed. Constitutional: She is oriented to person, place, and time. She appears well-developed and well-nourished.  HENT:  Head: Normocephalic.  Eyes: Pupils are equal, round, and reactive to light.  Neck: Normal range of motion.  Cardiovascular:  Normal rate.   Pulmonary/Chest: Effort normal.    Musculoskeletal: Normal range of motion.  Neurological: She is alert and oriented to person, place, and time.    ED Course  Procedures   DIAGNOSTIC STUDIES:  Oxygen Saturation is94% on RA, adequate by my interpretation.    COORDINATION OF CARE:  9:13 PM Discussed treatment plan with pt at bedside and pt agreed to plan.  Labs Review Labs Reviewed - No data to display  Imaging Review No results found.   EKG Interpretation None     I recommedn antibiotic warm compress and fu with PCP in 1 week  MDM   Final diagnoses:  Breast abscess    I personally performed the services described in this documentation, which was scribed in my presence. The recorded information has been reviewed and is accurate.   Garald Balding, NP 03/20/14 2124

## 2014-03-20 NOTE — Discharge Instructions (Signed)
Abscess An abscess (boil or furuncle) is an infected area on or under the skin. This area is filled with yellowish-white fluid (pus) and other material (debris). HOME CARE   Only take medicines as told by your doctor.  If you were given antibiotic medicine, take it as directed. Finish the medicine even if you start to feel better.  If gauze is used, follow your doctor's directions for changing the gauze.  To avoid spreading the infection:  Keep your abscess covered with a bandage.  Wash your hands well.  Do not share personal care items, towels, or whirlpools with others.  Avoid skin contact with others.  Keep your skin and clothes clean around the abscess.  Keep all doctor visits as told. GET HELP RIGHT AWAY IF:   You have more pain, puffiness (swelling), or redness in the wound site.  You have more fluid or blood coming from the wound site.  You have muscle aches, chills, or you feel sick.  You have a fever. MAKE SURE YOU:   Understand these instructions.  Will watch your condition.  Will get help right away if you are not doing well or get worse. Document Released: 11/25/2007 Document Revised: 12/08/2011 Document Reviewed: 08/21/2011 [facility] Patient Information 2015 [place], [place]. This information is not intended to replace advice given to you by your health care provider. Make sure you discuss any questions you have with your health care provider. Please take the antibiotic as directed until, completed.  Warm compresses, 3-4 times a day, to help the area, drain, try to prevent skin on skin contact until his has healed.  Please make a point with your primary care physician to be seen approximately one week

## 2014-03-20 NOTE — ED Notes (Signed)
Pt has a black spot on her left breast, she thought it was a mole until yesterday she could notice it was there, no necessarily sore at this time

## 2014-03-21 ENCOUNTER — Telehealth: Payer: Self-pay | Admitting: Internal Medicine

## 2014-03-21 ENCOUNTER — Telehealth: Payer: Self-pay | Admitting: Emergency Medicine

## 2014-03-21 NOTE — ED Provider Notes (Signed)
Medical screening examination/treatment/procedure(s) were performed by non-physician practitioner and as supervising physician I was immediately available for consultation/collaboration.   EKG Interpretation None        Ernestina Patches, MD 03/21/14 1421

## 2014-03-21 NOTE — Telephone Encounter (Deleted)
Called and spoke with the pt  She states that her pharmacist advised her that she should not take the

## 2014-03-21 NOTE — Telephone Encounter (Signed)
Patient states she has taken all the meds Dr. Jenny Reichmann gave her for her cold.  She is requesting Dr. Jenny Reichmann to send in additional meds.  Patient states she went to ER on Sunday bc she had an infection on her breast.

## 2014-03-21 NOTE — Telephone Encounter (Signed)
At this point, if no high fever or wheezing, ok to hold off on further med, unless she is already on an antibx for the breast infection  You can also take Delsym OTC for cough, and/or Mucinex (or it's generic off brand) for congestion, and tylenol as needed for pain.  Remember some symptoms take a while (1-2 wks even) to resolve even though the infection itself has cleared

## 2014-03-21 NOTE — Telephone Encounter (Signed)
Called the patient informed of MD instructions.  The patient did verbalize understanding all information

## 2014-03-21 NOTE — Telephone Encounter (Signed)
Spoke with the pt  She states that her pharmacist advised that 2 of the meds that RB recently prescribed have a bad interaction together She is unsure of the name of these meds  I called and spoke with the pharmacist and she states that she urged the pt to not take the chlorpheniramine and and claritin together  RB please advise thanks

## 2014-03-23 ENCOUNTER — Ambulatory Visit: Payer: Medicare Other | Admitting: Emergency Medicine

## 2014-03-23 NOTE — Telephone Encounter (Signed)
It is ok for her to take the chlorpheniramine as needed if the loratadine isn't controlling her symptoms.

## 2014-03-23 NOTE — Telephone Encounter (Signed)
i have called and spoke with pt and i also have sent her and email via mychart per her request.  Pt voiced her understanding and nothing further is needed.

## 2014-03-23 NOTE — Telephone Encounter (Signed)
Called patient stating that shoes were on back order. Told her I would call her when they arrive.

## 2014-03-27 ENCOUNTER — Ambulatory Visit: Payer: Medicare Other | Admitting: Internal Medicine

## 2014-04-03 ENCOUNTER — Encounter: Payer: Self-pay | Admitting: Internal Medicine

## 2014-04-03 ENCOUNTER — Ambulatory Visit (INDEPENDENT_AMBULATORY_CARE_PROVIDER_SITE_OTHER): Payer: Medicare Other | Admitting: Internal Medicine

## 2014-04-03 VITALS — BP 132/80 | HR 114 | Temp 98.2°F | Wt 333.4 lb

## 2014-04-03 DIAGNOSIS — N3941 Urge incontinence: Secondary | ICD-10-CM | POA: Diagnosis not present

## 2014-04-03 DIAGNOSIS — K219 Gastro-esophageal reflux disease without esophagitis: Secondary | ICD-10-CM

## 2014-04-03 DIAGNOSIS — R159 Full incontinence of feces: Secondary | ICD-10-CM

## 2014-04-03 DIAGNOSIS — I10 Essential (primary) hypertension: Secondary | ICD-10-CM

## 2014-04-03 DIAGNOSIS — R05 Cough: Secondary | ICD-10-CM | POA: Diagnosis not present

## 2014-04-03 DIAGNOSIS — R059 Cough, unspecified: Secondary | ICD-10-CM

## 2014-04-03 NOTE — Patient Instructions (Addendum)
Please continue all other medications as before, and refills have been done if requested.  Please have the pharmacy call with any other refills you may need.  Please keep your appointments with your specialists as you may have planned  You will be contacted regarding the referral for: Pulmonary- [facility], and Urology - Dr Janice Norrie, and GI - Dr Olevia Perches

## 2014-04-03 NOTE — Progress Notes (Signed)
Pre visit review using our clinic review tool, if applicable. No additional management support is needed unless otherwise documented below in the visit note. 

## 2014-04-03 NOTE — Progress Notes (Signed)
Subjective:    Patient ID: Christy May, female    DOB: 1951/05/11, 63 y.o.   MRN: [id number]  HPI  Here to f/u with ongoing cough, tx aggressively per pulm/dr byrum for gerd and rhinitis with singulair, claritin, nexium bid, and pepcid 20 qd. Still with occas breakthrough reflux, and still with nonprod cough, at least every 30 min, wakes her up at night, now ongoing 7-8 mo.  Tx for infection with antibiotic and steroid, but cough persists. CXR  July 3 - NAD. Pt wants referral to tertiary care.  Pt denies chest pain, increased sob or doe, wheezing, orthopnea, PND, increased LE swelling, palpitations, dizziness or syncope except for above.   Pt denies polydipsia, polyuria. But has ongoing months of urge incontinence, sometimes assoc with fecal incontinence, requests f/u with Dr Janice Norrie she has seen prior.    Past Medical History  Diagnosis Date  . Anemia, unspecified   . Extrinsic asthma, unspecified   . Chronic airway obstruction, not elsewhere classified   . Spondylosis of unspecified site without mention of myelopathy   . Dehydration   . Depressive disorder, not elsewhere classified   . Type II or unspecified type diabetes mellitus without mention of complication, not stated as uncontrolled   . Esophageal reflux   . Unspecified essential hypertension   . Unspecified menopausal and postmenopausal disorder   . Obesity, unspecified   . Inflammatory and toxic neuropathy, unspecified   . Unspecified sleep apnea   . Unspecified vitamin D deficiency   . Dysphagia 2007    historyof dysphagia with severe dysmotility by barium swallow-[name]  . Obesity   . Hypertension   . Diabetes mellitus without complication   . Allergic bronchitis   . Sleep apnea   . Gout 01/04/2013   Past Surgical History  Procedure Laterality Date  . Laser sugery      bilateral eyes  . Back surgery      fusion-multiple cervical spine levels-Dr. Arnoldo Morale  . Excision of abcess      ?? Chest  . Dilation and  curettage of uterus    . Exploratory laparotomy    . Partial hysterectomy    . Total knee arthroplasty  2005    Left  . Total knee arthroplasty  2005    Right- [name]  . Foot surgery      Right X 2  . Cholecystectomy    . Appendectomy    . Tonsillectomy    . Abdominal hysterectomy      reports that she quit smoking about 27 years ago. She has never used smokeless tobacco. She reports that she does not drink alcohol or use illicit drugs. family history includes Colon polyps in her brother; Diabetes in her sister and another family member; Esophageal cancer in her brother and sister; Heart disease in her maternal grandfather; Pancreatic cancer in her sister. Allergies  Allergen Reactions  . Ace Inhibitors Anaphylaxis  . Penicillins Hives and Rash  . Tape Hives    Paper tape is ok to use  . Adhesive [Tape] Rash   Current Outpatient Prescriptions on File Prior to Visit  Medication Sig Dispense Refill  . albuterol (PROVENTIL) (2.5 MG/3ML) 0.083% nebulizer solution Take 3 mLs (2.5 mg total) by nebulization every 6 (six) hours as needed. Respiratory distress  75 mL  11  . aspirin 81 MG tablet Take 81 mg by mouth daily.        . benzonatate (TESSALON) 100 MG capsule 1-2 tab by  mouth every 6 hrs as needed for cough  60 capsule  0  . chlorpheniramine (CHLOR-TRIMETON) 4 MG tablet Take 4 mg by mouth as directed.      . cholecalciferol (VITAMIN D) 1000 UNITS tablet Take 2,000 Units by mouth daily.        . cyclobenzaprine (FLEXERIL) 5 MG tablet TAKE 1 TABLET BY MOUTH THREE TIMES DAILY AS NEEDED FOR MUSCLE SPASMS  90 tablet  0  . diclofenac sodium (VOLTAREN) 1 % GEL Apply 4 g topically 4 (four) times daily. As needed to affected area  100 g  5  . esomeprazole (NEXIUM) 40 MG capsule Take 1 capsule (40 mg total) by mouth 2 (two) times daily.  60 capsule  3  . famotidine (PEPCID) 20 MG tablet Take 1 tablet (20 mg total) by mouth at bedtime.  30 tablet  3  . fluticasone (FLONASE) 50 MCG/ACT nasal  spray Place 2 sprays into both nostrils daily.  16 g  3  . gabapentin (NEURONTIN) 800 MG tablet TAKE 1 TABLET BY MOUTH THREE TIMES DAILY  270 tablet  1  . glucose blood (ONE TOUCH ULTRA TEST) test strip Use to test blood sugars twice a day. Dx 250.63  100 each  12  . HYDROcodone-homatropine (HYCODAN) 5-1.5 MG/5ML syrup Take 5 mLs by mouth every 6 (six) hours as needed for cough.  480 mL  0  . Insulin Glargine (LANTUS) 100 UNIT/ML Solostar Pen Inject 275 Units into the skin every morning. And pen needles 2/day      . irbesartan (AVAPRO) 300 MG tablet Take 300 mg by mouth daily.      . Lancets (ONETOUCH ULTRASOFT) lancets Use to test blood sugars twice a day.  100 each  12  . loratadine (CLARITIN) 10 MG tablet Take 1 tablet (10 mg total) by mouth daily.  30 tablet  3  . metoprolol (LOPRESSOR) 50 MG tablet TAKE 1& 1/2 TABLETS TWICE DAILY  270 tablet  3  . montelukast (SINGULAIR) 10 MG tablet Take 1 tablet (10 mg total) by mouth daily.  30 tablet  3  . nystatin cream (MYCOSTATIN) Apply to affected area 2 times daily  30 g  0  . oxybutynin (DITROPAN-XL) 10 MG 24 hr tablet Take 1 tablet (10 mg total) by mouth daily.  90 tablet  3  . promethazine (PHENERGAN) 25 MG tablet TAKE 1 TABLET BY MOUTH EVERY 6 HOURS AS NEEDED FOR NAUSEA  30 tablet  0  . sulfamethoxazole-trimethoprim (BACTRIM DS) 800-160 MG per tablet Take 1 tablet by mouth 2 (two) times daily.  13 tablet  0   No current facility-administered medications on file prior to visit.     Review of Systems  Constitutional: Negative for unusual diaphoresis or other sweats  HENT: Negative for ringing in ear Eyes: Negative for double vision or worsening visual disturbance.  Respiratory: Negative for choking and stridor.   Gastrointestinal: Negative for vomiting or other signifcant bowel change Genitourinary: Negative for hematuria or decreased urine volume.  Musculoskeletal: Negative for other MSK pain or swelling Skin: Negative for color change and  worsening wound.  Neurological: Negative for tremors and numbness other than noted  Psychiatric/Behavioral: Negative for decreased concentration or agitation other than above       Objective:   Physical Exam BP 132/80  Pulse 114  Temp(Src) 98.2 F (36.8 C) (Oral)  Wt 333 lb 6 oz (151.218 kg)  SpO2 93% VS noted,  Constitutional: Pt appears well-developed, well-nourished.  HENT: Head:  NCAT.  Right Ear: External ear normal.  Left Ear: External ear normal.  Eyes: . Pupils are equal, round, and reactive to light. Conjunctivae and EOM are normal Neck: Normal range of motion. Neck supple.  Cardiovascular: Normal rate and regular rhythm.   Pulmonary/Chest: Effort normal and breath sounds normal.  - no rales or wheezing Abd:  Soft, NT, ND, + BS Neurological: Pt is alert. Not confused , motor grossly intact Skin: Skin is warm. No rash Psychiatric: Pt behavior is normal. No agitation.     Assessment & Plan:

## 2014-04-05 ENCOUNTER — Other Ambulatory Visit: Payer: Self-pay | Admitting: Internal Medicine

## 2014-04-06 ENCOUNTER — Other Ambulatory Visit: Payer: Self-pay

## 2014-04-06 DIAGNOSIS — N3642 Intrinsic sphincter deficiency (ISD): Secondary | ICD-10-CM | POA: Diagnosis not present

## 2014-04-06 DIAGNOSIS — N390 Urinary tract infection, site not specified: Secondary | ICD-10-CM | POA: Diagnosis not present

## 2014-04-08 DIAGNOSIS — N3941 Urge incontinence: Secondary | ICD-10-CM | POA: Insufficient documentation

## 2014-04-08 NOTE — Assessment & Plan Note (Signed)
To cont current tx, for pulm referral - [facility],  to f/u any worsening symptoms or concerns

## 2014-04-08 NOTE — Assessment & Plan Note (Signed)
To cont current max tx, refer GI ,  to f/u any worsening symptoms or concerns

## 2014-04-08 NOTE — Assessment & Plan Note (Signed)
stable overall by history and exam, recent data reviewed with pt, and pt to continue medical treatment as before,  to f/u any worsening symptoms or concerns BP Readings from Last 3 Encounters:  04/03/14 132/80  03/20/14 150/76  03/13/14 130/80

## 2014-04-08 NOTE — Assessment & Plan Note (Signed)
?   [facility] vs other , cont current tx, refer urology as per pt request

## 2014-04-09 ENCOUNTER — Encounter: Payer: Self-pay | Admitting: Gastroenterology

## 2014-04-11 DIAGNOSIS — R05 Cough: Secondary | ICD-10-CM | POA: Diagnosis not present

## 2014-04-11 DIAGNOSIS — E669 Obesity, unspecified: Secondary | ICD-10-CM | POA: Diagnosis not present

## 2014-04-11 DIAGNOSIS — G4733 Obstructive sleep apnea (adult) (pediatric): Secondary | ICD-10-CM | POA: Diagnosis not present

## 2014-04-11 DIAGNOSIS — E119 Type 2 diabetes mellitus without complications: Secondary | ICD-10-CM | POA: Diagnosis not present

## 2014-04-11 DIAGNOSIS — K219 Gastro-esophageal reflux disease without esophagitis: Secondary | ICD-10-CM | POA: Diagnosis not present

## 2014-04-12 ENCOUNTER — Ambulatory Visit (INDEPENDENT_AMBULATORY_CARE_PROVIDER_SITE_OTHER): Payer: Medicare Other | Admitting: Gastroenterology

## 2014-04-12 ENCOUNTER — Encounter: Payer: Self-pay | Admitting: Gastroenterology

## 2014-04-12 ENCOUNTER — Other Ambulatory Visit: Payer: Self-pay | Admitting: Internal Medicine

## 2014-04-12 VITALS — BP 130/80 | HR 123 | Ht 64.0 in | Wt 334.0 lb

## 2014-04-12 DIAGNOSIS — R05 Cough: Secondary | ICD-10-CM | POA: Diagnosis not present

## 2014-04-12 DIAGNOSIS — K219 Gastro-esophageal reflux disease without esophagitis: Secondary | ICD-10-CM | POA: Diagnosis not present

## 2014-04-12 DIAGNOSIS — R194 Change in bowel habit: Secondary | ICD-10-CM | POA: Diagnosis not present

## 2014-04-12 DIAGNOSIS — R059 Cough, unspecified: Secondary | ICD-10-CM

## 2014-04-12 DIAGNOSIS — R159 Full incontinence of feces: Secondary | ICD-10-CM | POA: Diagnosis not present

## 2014-04-12 NOTE — Patient Instructions (Addendum)
You have been scheduled for an endoscopy and colonoscopy at [facility]. Please follow the written instructions given to you at your visit today. Please pick up your prep at the pharmacy within the next 1-3 days. If you use inhalers (even only as needed), please bring them with you on the day of your procedure. Your physician has requested that you go to www.startemmi.com and enter the access code given to you at your visit today. This web site gives a general overview about your procedure. However, you should still follow specific instructions given to you by our office regarding your preparation for the procedure.   Please contact your doctor at [facility] in regards to restarting your pelvic floor exercises

## 2014-04-17 ENCOUNTER — Encounter: Payer: Self-pay | Admitting: Gastroenterology

## 2014-04-17 ENCOUNTER — Telehealth: Payer: Self-pay | Admitting: Internal Medicine

## 2014-04-17 DIAGNOSIS — R159 Full incontinence of feces: Secondary | ICD-10-CM | POA: Insufficient documentation

## 2014-04-17 DIAGNOSIS — R194 Change in bowel habit: Secondary | ICD-10-CM | POA: Insufficient documentation

## 2014-04-17 NOTE — Telephone Encounter (Signed)
Called the patient informed she is due her flu shot, possible (PCP would have to order) prevnar 13 and shingles.  Did inform she would need to check with her insurance on what they would pay and then next OV discuss all due vaccines with PCP.

## 2014-04-17 NOTE — Telephone Encounter (Signed)
Patient has questions in regards to shingles vac and a few other vacs that my chart is saying she needs.  She is concerned about getting the vacs with a low immune system.

## 2014-04-17 NOTE — Progress Notes (Signed)
I agree. Please add 48 hour Bravo probe to EGD. This will be done while she is on PPI and H2 RA to see if she has a an adequate  acid suppression.

## 2014-04-17 NOTE — Progress Notes (Signed)
04/17/2014 Christy May [id number] August 14, 1950   HISTORY OF PRESENT ILLNESS:  This is a 63 year old female how is previously known to Dr. Olevia May for EGD in 11/2007 at which time she was found to have only a hiatal hernia.  Also had colonoscopy in 01/2006 that was normal.    She presents to [place] office today with a couple of issues.  She complains of a cough that has been present for several months.  She has a terrible cough all the time and has seen pulmonary here in [place] and most recently in [place].  She is using albuterol, Tessalon, chlorpheniramine, flonase, hycodan syrup, claritin, and singulair all to help with this cough.  Plus, they have her on twice daily Nexium 40 mg and pepcid 20 mg at bedtime to see if this cough is from reflux.  She says that since the cough has been present she has severe urinary and stool incontinence as well.  She coughs so violently that she "floods" her pants and is incontinent of stool.  The stool that she is incontinent of is solid stool NOT diarrhea.  She denies blood in her stool.  She is becoming very frustrated and distressed by these issues.  She did have an esophagram in 01/2006 as well that showed a small sliding hiatal hernia with considerable reflux and marked dysmotility.  There was also mild extrinsic compression upon the cervical esophagus ?goiter.  She denies dysphagia.  Has had weight gain.    Past Medical History  Diagnosis Date  . Anemia, unspecified   . Extrinsic asthma, unspecified   . Chronic airway obstruction, not elsewhere classified   . Spondylosis of unspecified site without mention of myelopathy   . Dehydration   . Depressive disorder, not elsewhere classified   . Type II or unspecified type diabetes mellitus without mention of complication, not stated as uncontrolled   . Esophageal reflux   . Unspecified essential hypertension   . Unspecified menopausal and postmenopausal disorder   . Obesity, unspecified   .  Inflammatory and toxic neuropathy, unspecified   . Unspecified sleep apnea   . Unspecified vitamin D deficiency   . Dysphagia 2007    historyof dysphagia with severe dysmotility by barium swallow-Christy May  . Obesity   . Hypertension   . Diabetes mellitus without complication   . Allergic bronchitis   . Sleep apnea   . Gout 01/04/2013   Past Surgical History  Procedure Laterality Date  . Laser sugery      bilateral eyes  . Back surgery      fusion-multiple cervical spine levels-Dr. Arnoldo May  . Excision of abcess      ?? Chest  . Dilation and curettage of uterus    . Exploratory laparotomy    . Partial hysterectomy    . Total knee arthroplasty  2005    Left  . Total knee arthroplasty  2005    Right- Dr. Mayer May  . Foot surgery      Right X 2  . Cholecystectomy    . Appendectomy    . Tonsillectomy    . Abdominal hysterectomy      reports that she quit smoking about 27 years ago. She has never used smokeless tobacco. She reports that she does not drink alcohol or use illicit drugs. family history includes Colon polyps in her brother; Diabetes in her sister and another family member; Esophageal cancer in her brother and sister; Heart disease in her maternal grandfather;  Pancreatic cancer in her sister. Allergies  Allergen Reactions  . Ace Inhibitors Anaphylaxis  . Penicillins Hives and Rash  . Tape Hives    Paper tape is ok to use  . Adhesive [Tape] Rash      Outpatient Encounter Prescriptions as of 04/12/2014  Medication Sig  . albuterol (PROVENTIL) (2.5 MG/3ML) 0.083% nebulizer solution Take 3 mLs (2.5 mg total) by nebulization every 6 (six) hours as needed. Respiratory distress  . aspirin 81 MG tablet Take 81 mg by mouth daily.    . benzonatate (TESSALON) 100 MG capsule TAKE 1 TO 2 CAPSULES BY MOUTH EVERY 6 HOURS AS NEEDED FOR COUGH  . chlorpheniramine (CHLOR-TRIMETON) 4 MG tablet Take 4 mg by mouth as directed.  . cholecalciferol (VITAMIN D) 1000 UNITS tablet Take  2,000 Units by mouth daily.    . cyclobenzaprine (FLEXERIL) 5 MG tablet TAKE 1 TABLET BY MOUTH THREE TIMES DAILY EVERY DAY AS NEEDED FOR MUSCLE SPASMS  . diclofenac sodium (VOLTAREN) 1 % GEL Apply 4 g topically 4 (four) times daily. As needed to affected area  . esomeprazole (NEXIUM) 40 MG capsule Take 1 capsule (40 mg total) by mouth 2 (two) times daily.  . famotidine (PEPCID) 20 MG tablet Take 1 tablet (20 mg total) by mouth at bedtime.  . fluticasone (FLONASE) 50 MCG/ACT nasal spray Place 2 sprays into both nostrils daily.  Marland Kitchen gabapentin (NEURONTIN) 800 MG tablet TAKE 1 TABLET BY MOUTH THREE TIMES DAILY  . glucose blood (ONE TOUCH ULTRA TEST) test strip Use to test blood sugars twice a day. Dx 250.63  . HYDROcodone-homatropine (HYCODAN) 5-1.5 MG/5ML syrup Take 5 mLs by mouth every 6 (six) hours as needed for cough.  . Insulin Glargine (LANTUS) 100 UNIT/ML Solostar Pen Inject 275 Units into the skin every morning. And pen needles 2/day  . irbesartan (AVAPRO) 300 MG tablet Take 300 mg by mouth daily.  . Lancets (ONETOUCH ULTRASOFT) lancets Use to test blood sugars twice a day.  . loratadine (CLARITIN) 10 MG tablet Take 1 tablet (10 mg total) by mouth daily.  . metoprolol (LOPRESSOR) 50 MG tablet TAKE 1& 1/2 TABLETS TWICE DAILY  . montelukast (SINGULAIR) 10 MG tablet Take 1 tablet (10 mg total) by mouth daily.  Marland Kitchen nystatin cream (MYCOSTATIN) Apply to affected area 2 times daily  . oxybutynin (DITROPAN-XL) 10 MG 24 hr tablet Take 1 tablet (10 mg total) by mouth daily.  Marland Kitchen sulfamethoxazole-trimethoprim (BACTRIM DS) 800-160 MG per tablet Take 1 tablet by mouth 2 (two) times daily.  . [DISCONTINUED] promethazine (PHENERGAN) 25 MG tablet TAKE 1 TABLET BY MOUTH EVERY 6 HOURS AS NEEDED FOR NAUSEA     REVIEW OF SYSTEMS  : All other systems reviewed and negative except where noted in the History of Present Illness.   PHYSICAL EXAM: BP 130/80  Pulse 123  Ht 5\' 4"  (1.626 m)  Wt 334 lb (151.501 kg)   BMI 57.30 kg/m2  SpO2 97% General:  Obese black female in no acute distress, but anxious and tearful Head: Normocephalic and atraumatic Eyes:  Sclerae anicteric, conjunctiva pink. Ears: Normal auditory acuity Lungs: Clear throughout to auscultation Heart:  Tachy.  No murmurs noted. Abdomen: Soft, obese, non-distended.  Normal bowel sounds.  Non-tender. Rectal:  Very poor sphincter tone and pelvic muscle strength.  No masses felt.  Brown, heme negative stool on exam glove. Musculoskeletal: Symmetrical with no gross deformities  Skin: No lesions on visible extremities Extremities: No edema  Neurological: Alert oriented x 4, grossly  non-focal Psychological:  Alert and cooperative. Normal mood and affect  ASSESSMENT AND PLAN: -Cough:  Ultimately this is the biggest problem that is complicating and worsening her other issues.  If cough can be controlled then the urinary and stool incontinence issues would not be as severe.  Seeing pulmonary in [place] and [place].  ? GERD related.  She is on maximum acid suppression with BID PPI and H2 blocker at bedtime.  Will schedule EGD with Dr. Olevia May at [facility] for evaluation.  The risks, benefits, and alternatives were discussed with the patient and she consents to proceed.  -Fecal incontinence:  Present only since the severe cough.  If the cough can be controlled then I believe that this would not be as big of an issue.  I believe that this is a pelvis floor/muscle issue.  Will schedule colonoscopy as well to further evaluate since her last colonoscopy was over 8 years ago.  She will also be referred for pelvic floor rehab.

## 2014-04-18 ENCOUNTER — Ambulatory Visit (INDEPENDENT_AMBULATORY_CARE_PROVIDER_SITE_OTHER): Payer: Medicare Other | Admitting: Endocrinology

## 2014-04-18 ENCOUNTER — Encounter: Payer: Self-pay | Admitting: Endocrinology

## 2014-04-18 ENCOUNTER — Telehealth: Payer: Self-pay

## 2014-04-18 VITALS — BP 129/86 | HR 96 | Temp 97.8°F | Ht 64.0 in | Wt 337.0 lb

## 2014-04-18 DIAGNOSIS — IMO0002 Reserved for concepts with insufficient information to code with codable children: Secondary | ICD-10-CM

## 2014-04-18 DIAGNOSIS — E1041 Type 1 diabetes mellitus with diabetic mononeuropathy: Secondary | ICD-10-CM

## 2014-04-18 DIAGNOSIS — E1049 Type 1 diabetes mellitus with other diabetic neurological complication: Secondary | ICD-10-CM

## 2014-04-18 DIAGNOSIS — E1065 Type 1 diabetes mellitus with hyperglycemia: Principal | ICD-10-CM

## 2014-04-18 NOTE — Patient Instructions (Addendum)
Please come back for a follow-up appointment in 3 months.   blood tests are being requested for you today.  We'll contact you with results.   check your blood sugar twice a day.  vary the time of day when you check, between before the 3 meals, and at bedtime.  also check if you have symptoms of your blood sugar being too high or too low.  please keep a record of the readings and bring it to your next appointment here.  please call us sooner if your blood sugar goes below 70, or if you have a lot of readings over 200.   On this type of insulin schedule, you should eat meals on a regular schedule.  If a meal is missed or significantly delayed, your blood sugar could go low.   You may be able to reduce the losing urine by voiding every 2 hrs or so while you are awake.

## 2014-04-18 NOTE — Progress Notes (Signed)
Per [name]:  Please add 48 hour Bravo probe to EGD. This will be done while she is on PPI and H2 RA to see if she has a an adequate acid suppression.  Could you please see if you could add this on to her already scheduled EGD/colonoscopy at [facility] hospital?  Thank you,  Jess

## 2014-04-18 NOTE — Telephone Encounter (Signed)
48 hour Bravo PH probe added to the endo/colon scheduled at [facility] for 05/08/14.  I have left a message for the patient and mailed her a letter

## 2014-04-18 NOTE — Progress Notes (Signed)
Subjective:    Patient ID: Christy May, female    DOB: December 13, 1950, 63 y.o.   MRN: [id number]  HPI Pt returns for f/u of diabetes mellitus: DM type: Insulin-requiring type 2 Dx'ed: 1885 Complications: none Therapy: insulin since 2002 GDM: never DKA: never Severe hypoglycemia: never Pancreatitis: never Other: she was changed to a simpler qd insulin regimen, after poor results on multiple daily injections; she wants the cheapest possible insulin, but she did poorly on NPH.   Interval history: She is pursuing weight loss surgery.  no cbg record, but states cbg's vary from 77-300.  There is no trend throughout the day.  She says cbg's are not related to meals.  She says she takes the insulin as rx'ed.   Past Medical History  Diagnosis Date  . Anemia, unspecified   . Extrinsic asthma, unspecified   . Chronic airway obstruction, not elsewhere classified   . Spondylosis of unspecified site without mention of myelopathy   . Dehydration   . Depressive disorder, not elsewhere classified   . Type II or unspecified type diabetes mellitus without mention of complication, not stated as uncontrolled   . Esophageal reflux   . Unspecified essential hypertension   . Unspecified menopausal and postmenopausal disorder   . Obesity, unspecified   . Inflammatory and toxic neuropathy, unspecified   . Unspecified sleep apnea   . Unspecified vitamin D deficiency   . Dysphagia 2007    historyof dysphagia with severe dysmotility by barium swallow-[name]  . Obesity   . Hypertension   . Diabetes mellitus without complication   . Allergic bronchitis   . Sleep apnea   . Gout 01/04/2013    Past Surgical History  Procedure Laterality Date  . Laser sugery      bilateral eyes  . Back surgery      fusion-multiple cervical spine levels-Dr. Arnoldo Morale  . Excision of abcess      ?? Chest  . Dilation and curettage of uterus    . Exploratory laparotomy    . Partial hysterectomy    . Total knee  arthroplasty  2005    Left  . Total knee arthroplasty  2005    Right- [name]  . Foot surgery      Right X 2  . Cholecystectomy    . Appendectomy    . Tonsillectomy    . Abdominal hysterectomy      History   Social History  . Marital Status: Widowed    Spouse Name: N/A    Number of Children: N/A  . Years of Education: N/A   Occupational History  . disabled    Social History Main Topics  . Smoking status: Former Smoker    Quit date: 06/22/1986  . Smokeless tobacco: Never Used  . Alcohol Use: No  . Drug Use: No  . Sexual Activity: Not on file   Other Topics Concern  . Not on file   Social History Narrative   ** Merged History Encounter **       Patient does not get regular exercise.      Widowed 2004      Disabled    Current Outpatient Prescriptions on File Prior to Visit  Medication Sig Dispense Refill  . albuterol (PROVENTIL) (2.5 MG/3ML) 0.083% nebulizer solution Take 3 mLs (2.5 mg total) by nebulization every 6 (six) hours as needed. Respiratory distress  75 mL  11  . aspirin 81 MG tablet Take 81 mg by mouth daily.        Marland Kitchen  benzonatate (TESSALON) 100 MG capsule TAKE 1 TO 2 CAPSULES BY MOUTH EVERY 6 HOURS AS NEEDED FOR COUGH  60 capsule  0  . chlorpheniramine (CHLOR-TRIMETON) 4 MG tablet Take 4 mg by mouth as directed.      . cholecalciferol (VITAMIN D) 1000 UNITS tablet Take 2,000 Units by mouth daily.        . cyclobenzaprine (FLEXERIL) 5 MG tablet TAKE 1 TABLET BY MOUTH THREE TIMES DAILY EVERY DAY AS NEEDED FOR MUSCLE SPASMS  90 tablet  0  . diclofenac sodium (VOLTAREN) 1 % GEL Apply 4 g topically 4 (four) times daily. As needed to affected area  100 g  5  . esomeprazole (NEXIUM) 40 MG capsule Take 1 capsule (40 mg total) by mouth 2 (two) times daily.  60 capsule  3  . famotidine (PEPCID) 20 MG tablet Take 1 tablet (20 mg total) by mouth at bedtime.  30 tablet  3  . fluticasone (FLONASE) 50 MCG/ACT nasal spray Place 2 sprays into both nostrils daily.  16 g   3  . gabapentin (NEURONTIN) 800 MG tablet TAKE 1 TABLET BY MOUTH THREE TIMES DAILY  270 tablet  1  . glucose blood (ONE TOUCH ULTRA TEST) test strip Use to test blood sugars twice a day. Dx 250.63  100 each  12  . HYDROcodone-homatropine (HYCODAN) 5-1.5 MG/5ML syrup Take 5 mLs by mouth every 6 (six) hours as needed for cough.  480 mL  0  . Insulin Glargine (LANTUS) 100 UNIT/ML Solostar Pen Inject 300 Units into the skin every morning. And pen needles 2/day      . irbesartan (AVAPRO) 300 MG tablet Take 300 mg by mouth daily.      . Lancets (ONETOUCH ULTRASOFT) lancets Use to test blood sugars twice a day.  100 each  12  . loratadine (CLARITIN) 10 MG tablet Take 1 tablet (10 mg total) by mouth daily.  30 tablet  3  . metoprolol (LOPRESSOR) 50 MG tablet TAKE 1& 1/2 TABLETS TWICE DAILY  270 tablet  3  . montelukast (SINGULAIR) 10 MG tablet Take 1 tablet (10 mg total) by mouth daily.  30 tablet  3  . nystatin cream (MYCOSTATIN) Apply to affected area 2 times daily  30 g  0  . oxybutynin (DITROPAN-XL) 10 MG 24 hr tablet Take 1 tablet (10 mg total) by mouth daily.  90 tablet  3  . promethazine (PHENERGAN) 25 MG tablet TAKE 1 TABLET BY MOUTH EVERY 6 HOURS AS NEEDED FOR NAUSEA AND VOMITING  30 tablet  0  . sulfamethoxazole-trimethoprim (BACTRIM DS) 800-160 MG per tablet Take 1 tablet by mouth 2 (two) times daily.  13 tablet  0   No current facility-administered medications on file prior to visit.    Allergies  Allergen Reactions  . Ace Inhibitors Anaphylaxis  . Penicillins Hives and Rash  . Tape Hives    Paper tape is ok to use  . Adhesive [Tape] Rash    Family History  Problem Relation Age of Onset  . Esophageal cancer Brother   . Esophageal cancer Sister     ?  . Colon polyps Brother   . Pancreatic cancer Sister     ?  . Diabetes Sister   . Diabetes      Aunt and Uncle  . Heart disease Maternal Grandfather     BP 129/86  Pulse 96  Temp(Src) 97.8 F (36.6 C) (Oral)  Ht 5\' 4"   (1.626 m)  Wt 337  lb (152.862 kg)  BMI 57.82 kg/m2  SpO2 91%    Review of Systems She denies hypoglycemia.  She reports urinary incontinence due to cough.     Objective:   Physical Exam VITAL SIGNS:  See vs page GENERAL: no distress.  Severe morbid obesity.  Pulses: dorsalis pedis intact bilat.  Feet: no deformity. feet are of normal color and temp. 1+ bilat leg edema.  There is bilateral onychomycosis.   Skin: no ulcer on the feet.  Skin on the feet is very dry. Neuro: sensation is intact to touch on the feet, but decreased from normal.    Lab Results  Component Value Date   HGBA1C 8.8* 04/18/2014      Assessment & Plan:  DM: moderate exacerbation Morbid obesity.  We discussed weight loss surgery again.  I again advised her to pursue.  Urinary incont, due to cough.  i told pt weight-loss surg may helps this also  Patient is advised the following: Patient Instructions  Please come back for a follow-up appointment in 3 months.   blood tests are being requested for you today.  We'll contact you with results.   check your blood sugar twice a day.  vary the time of day when you check, between before the 3 meals, and at bedtime.  also check if you have symptoms of your blood sugar being too high or too low.  please keep a record of the readings and bring it to your next appointment here.  please call us sooner if your blood sugar goes below 70, or if you have a lot of readings over 200.   On this type of insulin schedule, you should eat meals on a regular schedule.  If a meal is missed or significantly delayed, your blood sugar could go low.   You may be able to reduce the losing urine by voiding every 2 hrs or so while you are awake.

## 2014-04-19 ENCOUNTER — Ambulatory Visit: Payer: Medicare Other | Admitting: Gastroenterology

## 2014-04-19 ENCOUNTER — Encounter: Payer: Self-pay | Admitting: *Deleted

## 2014-04-19 ENCOUNTER — Encounter: Payer: Medicare Other | Attending: Endocrinology | Admitting: *Deleted

## 2014-04-19 VITALS — Ht 64.25 in | Wt 336.7 lb

## 2014-04-19 DIAGNOSIS — Z713 Dietary counseling and surveillance: Secondary | ICD-10-CM | POA: Diagnosis not present

## 2014-04-19 DIAGNOSIS — Z794 Long term (current) use of insulin: Secondary | ICD-10-CM | POA: Diagnosis not present

## 2014-04-19 DIAGNOSIS — Z6841 Body Mass Index (BMI) 40.0 and over, adult: Secondary | ICD-10-CM | POA: Insufficient documentation

## 2014-04-19 DIAGNOSIS — E118 Type 2 diabetes mellitus with unspecified complications: Secondary | ICD-10-CM | POA: Insufficient documentation

## 2014-04-19 DIAGNOSIS — IMO0002 Reserved for concepts with insufficient information to code with codable children: Secondary | ICD-10-CM

## 2014-04-19 DIAGNOSIS — E1165 Type 2 diabetes mellitus with hyperglycemia: Secondary | ICD-10-CM

## 2014-04-19 LAB — HEMOGLOBIN A1C: HEMOGLOBIN A1C: 8.8 % — AB (ref 4.6–6.5)

## 2014-04-19 NOTE — Patient Instructions (Signed)
Plan:  Aim for 3 Carb Choices per meal (45 grams) +/- 1 either way  Aim for 0-1 Carbs per snack if hungry  Include protein in moderation with your meals and snacks Consider reading food labels for Total Carbohydrate of foods Consider checking BG at alternate times per day as directed by MD  Notifiy your MD if you have 2-3 days of BG's in the 90's or below so he can tell you how much to cut back on your insulin dose Get some Glucose Tablets to carry in your purse`

## 2014-04-19 NOTE — Progress Notes (Signed)
Medical Nutrition Therapy:  Appt start time: 9476 end time:  1700.  Assessment:  Primary concerns today: 04/19/2014. She has uncontrolled DM2 and morbid obesity with BMI of 57.5. She states her MD is encouraging her to have Bariatric Surgery and she has attended [facility]. She states she was told that she is not appropriate for the [place] due to her Esophageal Reflux. She has reservations about the other options as well. I asked one of our Bariatric RD's to step into our appointment to answer some of her questions, which she did. Roxan Hockey , RD suggested that since she had Medicare, they will require 6 months of Supervised Weight Loss visits. So we can initiate that today with her follow up visit being with the Bariatric RD and as she proceeds with these visits she can learn more about her surgery options and decide in 6 months what her best options will be at that time. The patient appreciated this plan.   Preferred Learning Style:   No preference indicated   Learning Readiness:   Ready  Change in progress  MEDICATIONS: see list. Diabetes medication is Lanuts @ 275 units every evening   DIETARY INTAKE:  24-hr recall:  B ( AM): muffin with egg and [place] bacon sandwich OR 1-2 pkgs flavored oatmeal with 2 boiled eggs OR Cheerios with 2% milk, water  Snk ( AM): sandwich with lean meat or chicken salad OR fresh fruit  L ( PM): sandwich with fresh fruit, water Snk ( PM): pecans or almonds, fresh fruit D ( PM): not feeling well lately so goes to [employer] [facility] [place] to get meals: fried fish, cabbage, mac and cheese or candied yams, OR Chinese dinner with egg drop soup,water Snk ( PM): nuts OR popcorn OR oatmeal cookies OR Pecan Sandies Beverages: water  Usual physical activity: activities of daily living, walks short distances with walker that has a seat  Estimated energy needs: 1600 calories 180 g carbohydrates 120 g protein 44 g fat  Progress  Towards Goal(s):  In progress.   Nutritional Diagnosis:  NI-1.5 Excessive energy intake As related to activity level.  As evidenced by BMI of 57.5.    Intervention:  Nutrition counseling and diabetes education initiated. Discussed Carb Counting as method of portion control, reading food labels, and benefits of increased activity. Also discussed insulin action in relation to weight loss and encouraged her to continue SMBG daily and to notify her MD for insulin dose adjustment if BG in 90's or below for 3 days in a row to decrease risk for hypoglycemia. .  Plan:  Aim for 3 Carb Choices per meal (45 grams) +/- 1 either way  Aim for 0-1 Carbs per snack if hungry  Include protein in moderation with your meals and snacks Consider reading food labels for Total Carbohydrate of foods Consider checking BG at alternate times per day as directed by MD  Notifiy your MD if you have 2-3 days of BG's in the 90's or below so he can tell you how much to cut back on your insulin dose Get some Glucose Tablets to carry in your purse`  Teaching Method Utilized: Visual, Auditory and Hands on  Handouts given during visit include: Living Well with Diabetes Carb Counting and Food Label handouts Meal Plan Card  Support Group Flyer for Bariatric Program  Barriers to learning/adherence to lifestyle change: morbid obesity and it's affect on daily living  Demonstrated degree of understanding via:  Teach Back  Monitoring/Evaluation:  Dietary intake, exercise, reading food labels, and body weight in 1 month(s).

## 2014-04-20 ENCOUNTER — Encounter (HOSPITAL_COMMUNITY): Payer: Self-pay | Admitting: Pharmacy Technician

## 2014-04-24 ENCOUNTER — Telehealth: Payer: Self-pay | Admitting: Internal Medicine

## 2014-04-24 ENCOUNTER — Encounter (HOSPITAL_COMMUNITY): Payer: Self-pay | Admitting: *Deleted

## 2014-04-24 MED ORDER — PEG-KCL-NACL-NASULF-NA ASC-C 100 G PO SOLR: ORAL | Status: DC

## 2014-04-24 NOTE — Telephone Encounter (Signed)
Caller name: Cayden  Call back [place] on [address] [place]  Reason for call:  Pt has procedure on 11/17 and pt states that the pharmacy does not have the procedure prep (bile prep).  Can you advise pt?

## 2014-04-24 NOTE — Telephone Encounter (Signed)
Patient notified that rx has been sent today.

## 2014-04-25 DIAGNOSIS — H04201 Unspecified epiphora, right lacrimal gland: Secondary | ICD-10-CM | POA: Diagnosis not present

## 2014-04-25 DIAGNOSIS — E11331 Type 2 diabetes mellitus with moderate nonproliferative diabetic retinopathy with macular edema: Secondary | ICD-10-CM | POA: Diagnosis not present

## 2014-04-25 DIAGNOSIS — H25813 Combined forms of age-related cataract, bilateral: Secondary | ICD-10-CM | POA: Diagnosis not present

## 2014-04-26 ENCOUNTER — Telehealth: Payer: Self-pay | Admitting: Emergency Medicine

## 2014-04-26 MED ORDER — HYDROCODONE-HOMATROPINE 5-1.5 MG/5ML PO SYRP: 5.0000 mL | ORAL_SOLUTION | Freq: Four times a day (QID) | ORAL | Status: DC | PRN

## 2014-04-26 MED ORDER — FLUTICASONE PROPIONATE 50 MCG/ACT NA SUSP: 2.0000 | Freq: Every morning | NASAL | Status: DC

## 2014-04-26 NOTE — Telephone Encounter (Signed)
Called spoke with pt. Requesting refill on hycodan and flonase nasal spray. She reports she still has prod cough -clear phlem (but not as often now). She last had refill on hycodan 03/13/14 #480 ml's. Please advise RB tahnks

## 2014-04-26 NOTE — Telephone Encounter (Signed)
Pt aware RX will be left for pick up. nothing further needed

## 2014-04-26 NOTE — Telephone Encounter (Signed)
Ok to refill both?? 

## 2014-05-02 DIAGNOSIS — R05 Cough: Secondary | ICD-10-CM | POA: Diagnosis not present

## 2014-05-02 DIAGNOSIS — Z87891 Personal history of nicotine dependence: Secondary | ICD-10-CM | POA: Diagnosis not present

## 2014-05-02 DIAGNOSIS — R0609 Other forms of dyspnea: Secondary | ICD-10-CM | POA: Diagnosis not present

## 2014-05-03 ENCOUNTER — Encounter: Payer: Self-pay | Admitting: Podiatry

## 2014-05-03 ENCOUNTER — Ambulatory Visit (INDEPENDENT_AMBULATORY_CARE_PROVIDER_SITE_OTHER): Payer: Medicare Other | Admitting: Podiatry

## 2014-05-03 DIAGNOSIS — B351 Tinea unguium: Secondary | ICD-10-CM

## 2014-05-03 DIAGNOSIS — E1159 Type 2 diabetes mellitus with other circulatory complications: Secondary | ICD-10-CM | POA: Diagnosis not present

## 2014-05-03 DIAGNOSIS — M79673 Pain in unspecified foot: Secondary | ICD-10-CM | POA: Diagnosis not present

## 2014-05-03 DIAGNOSIS — M2040 Other hammer toe(s) (acquired), unspecified foot: Secondary | ICD-10-CM | POA: Diagnosis not present

## 2014-05-03 NOTE — Patient Instructions (Signed)
Diabetes and Foot Care Diabetes may cause you to have problems because of poor blood supply (circulation) to your feet and legs. This may cause the skin on your feet to become thinner, break easier, and heal more slowly. Your skin may become dry, and the skin may peel and crack. You may also have nerve damage in your legs and feet causing decreased feeling in them. You may not notice minor injuries to your feet that could lead to infections or more serious problems. Taking care of your feet is one of the most important things you can do for yourself.  HOME CARE INSTRUCTIONS  Wear shoes at all times, even in the house. Do not go barefoot. Bare feet are easily injured.  Check your feet daily for blisters, cuts, and redness. If you cannot see the bottom of your feet, use a mirror or ask someone for help.  Wash your feet with warm water (do not use hot water) and mild soap. Then pat your feet and the areas between your toes until they are completely dry. Do not soak your feet as this can dry your skin.  Apply a moisturizing lotion or petroleum jelly (that does not contain alcohol and is unscented) to the skin on your feet and to dry, brittle toenails. Do not apply lotion between your toes.  Trim your toenails straight across. Do not dig under them or around the cuticle. File the edges of your nails with an emery board or nail file.  Do not cut corns or calluses or try to remove them with medicine.  Wear clean socks or stockings every day. Make sure they are not too tight. Do not wear knee-high stockings since they may decrease blood flow to your legs.  Wear shoes that fit properly and have enough cushioning. To break in new shoes, wear them for just a few hours a day. This prevents you from injuring your feet. Always look in your shoes before you put them on to be sure there are no objects inside.  Do not cross your legs. This may decrease the blood flow to your feet.  If you find a minor scrape,  cut, or break in the skin on your feet, keep it and the skin around it clean and dry. These areas may be cleansed with mild soap and water. Do not cleanse the area with peroxide, alcohol, or iodine.  When you remove an adhesive bandage, be sure not to damage the skin around it.  If you have a wound, look at it several times a day to make sure it is healing.  Do not use heating pads or hot water bottles. They may burn your skin. If you have lost feeling in your feet or legs, you may not know it is happening until it is too late.  Make sure your health care provider performs a complete foot exam at least annually or more often if you have foot problems. Report any cuts, sores, or bruises to your health care provider immediately. SEEK MEDICAL CARE IF:   You have an injury that is not healing.  You have cuts or breaks in the skin.  You have an ingrown nail.  You notice redness on your legs or feet.  You feel burning or tingling in your legs or feet.  You have pain or cramps in your legs and feet.  Your legs or feet are numb.  Your feet always feel cold. SEEK IMMEDIATE MEDICAL CARE IF:   There is increasing redness,   swelling, or pain in or around a wound.  There is a red line that goes up your leg.  Pus is coming from a wound.  You develop a fever or as directed by your health care provider.  You notice a bad smell coming from an ulcer or wound. Document Released: 06/05/2000 Document Revised: 02/08/2013 Document Reviewed: 11/15/2012 ExitCare Patient Information 2015 [employer]. This information is not intended to replace advice given to you by your health care provider. Make sure you discuss any questions you have with your health care provider.  

## 2014-05-03 NOTE — Progress Notes (Signed)
Subjective:     Patient ID: Christy May, female   DOB: [birth year], 63 y.o.   MRN: [id number]  HPI obese female with thick nailbeds 1-5 both feet that are yellow brittle and painful when pressed   Review of Systems     Objective:   Physical Exam Neurovascular status unchanged with thick yellow brittle nailbeds that are painful and she cannot cut    Assessment:     Mycotic nail infection with pain 1-5 of both feet    Plan:     Debris painful nailbeds 1-5 both feet with no iatrogenic bleeding noted Diabetic shoes and insoles dispensed with instructions.

## 2014-05-06 NOTE — H&P (View-Only) (Signed)
Per [name]:  Please add 48 hour Bravo probe to EGD. This will be done while she is on PPI and H2 RA to see if she has a an adequate acid suppression.  Could you please see if you could add this on to her already scheduled EGD/colonoscopy at [facility] hospital?  Thank you,  Jess

## 2014-05-06 NOTE — Interval H&P Note (Signed)
History and Physical Interval Note:  05/06/2014 9:42 PM  Christy May  has presented today for surgery, with the diagnosis of Change in bowel habits  Gerd  Cough   The various methods of treatment have been discussed with the patient and family. After consideration of risks, benefits and other options for treatment, the patient has consented to  Procedure(s): ESOPHAGOGASTRODUODENOSCOPY (EGD) (N/A) COLONOSCOPY (N/A) BRAVO PH STUDY (N/A) as a surgical intervention .  The patient's history has been reviewed, patient examined, no change in status, stable for surgery.  I have reviewed the patient's chart and labs.  Questions were answered to the patient's satisfaction.     Delfin Edis

## 2014-05-07 ENCOUNTER — Telehealth: Payer: Self-pay | Admitting: *Deleted

## 2014-05-07 NOTE — Progress Notes (Signed)
Patient called needing bowel prep instructions and stated ate peanuts 05-06-14, patient given dr Olevia Perches nurse regina phone number to make aware ate peanuts 05-06-14 and to get bowel prep instructions.

## 2014-05-07 NOTE — Telephone Encounter (Signed)
Patient calling because she lost her written instructions for her colonoscopy/EGD. She has eaten nuts this weekend. She also reports she ate peanut butter crackers this afternoon.

## 2014-05-07 NOTE — Telephone Encounter (Signed)
Spoke with [name] and ok for patient to prep for procedure tomorrow.  Gave patient instructions over the phone for procedure again. She did read them back and understands.

## 2014-05-08 ENCOUNTER — Encounter (HOSPITAL_COMMUNITY)
Admission: RE | Disposition: A | Discharge status: Home / Self Care | Payer: Self-pay | Source: Ambulatory Visit | Attending: Internal Medicine

## 2014-05-08 ENCOUNTER — Ambulatory Visit (HOSPITAL_COMMUNITY)
Admission: RE | Admit: 2014-05-08 | Discharge: 2014-05-08 | Disposition: A | Discharge status: Home / Self Care | Payer: Medicare Other | Source: Ambulatory Visit | Attending: Internal Medicine | Admitting: Internal Medicine

## 2014-05-08 ENCOUNTER — Encounter (HOSPITAL_COMMUNITY): Payer: Self-pay | Admitting: Certified Registered Nurse Anesthetist

## 2014-05-08 ENCOUNTER — Ambulatory Visit (HOSPITAL_COMMUNITY): Payer: Medicare Other | Admitting: Certified Registered Nurse Anesthetist

## 2014-05-08 DIAGNOSIS — K209 Esophagitis, unspecified without bleeding: Secondary | ICD-10-CM | POA: Diagnosis not present

## 2014-05-08 DIAGNOSIS — K208 Other esophagitis: Secondary | ICD-10-CM | POA: Diagnosis not present

## 2014-05-08 DIAGNOSIS — K635 Polyp of colon: Secondary | ICD-10-CM | POA: Diagnosis not present

## 2014-05-08 DIAGNOSIS — K2950 Unspecified chronic gastritis without bleeding: Secondary | ICD-10-CM | POA: Insufficient documentation

## 2014-05-08 DIAGNOSIS — Z86010 Personal history of colonic polyps: Secondary | ICD-10-CM | POA: Diagnosis not present

## 2014-05-08 DIAGNOSIS — K621 Rectal polyp: Secondary | ICD-10-CM | POA: Diagnosis not present

## 2014-05-08 DIAGNOSIS — R194 Change in bowel habit: Secondary | ICD-10-CM | POA: Diagnosis not present

## 2014-05-08 DIAGNOSIS — K259 Gastric ulcer, unspecified as acute or chronic, without hemorrhage or perforation: Secondary | ICD-10-CM | POA: Insufficient documentation

## 2014-05-08 DIAGNOSIS — K449 Diaphragmatic hernia without obstruction or gangrene: Secondary | ICD-10-CM | POA: Diagnosis not present

## 2014-05-08 DIAGNOSIS — D125 Benign neoplasm of sigmoid colon: Secondary | ICD-10-CM

## 2014-05-08 DIAGNOSIS — K210 Gastro-esophageal reflux disease with esophagitis: Secondary | ICD-10-CM | POA: Insufficient documentation

## 2014-05-08 DIAGNOSIS — K219 Gastro-esophageal reflux disease without esophagitis: Secondary | ICD-10-CM | POA: Diagnosis not present

## 2014-05-08 HISTORY — PX: ESOPHAGOGASTRODUODENOSCOPY: SHX5428

## 2014-05-08 HISTORY — PX: COLONOSCOPY: SHX5424

## 2014-05-08 LAB — GLUCOSE, CAPILLARY
Glucose-Capillary: 90 mg/dL (ref 70–99)
Glucose-Capillary: 112 mg/dL — ABNORMAL HIGH (ref 70–99)

## 2014-05-08 SURGERY — EGD (ESOPHAGOGASTRODUODENOSCOPY)
Anesthesia: Monitor Anesthesia Care

## 2014-05-08 MED ORDER — LIDOCAINE HCL (CARDIAC) 20 MG/ML IV SOLN
INTRAVENOUS | Status: DC | PRN
  Administered 2014-05-08: 100 mg via INTRAVENOUS

## 2014-05-08 MED ORDER — PROPOFOL 10 MG/ML IV BOLUS
INTRAVENOUS | Status: AC
  Filled 2014-05-08: qty 20

## 2014-05-08 MED ORDER — BUTAMBEN-TETRACAINE-BENZOCAINE 2-2-14 % EX AERO
INHALATION_SPRAY | CUTANEOUS | Status: DC | PRN
  Administered 2014-05-08: 2 via TOPICAL

## 2014-05-08 MED ORDER — LIDOCAINE HCL 1 % IJ SOLN
INTRAMUSCULAR | Status: AC
  Filled 2014-05-08: qty 20

## 2014-05-08 MED ORDER — KETAMINE HCL 10 MG/ML IJ SOLN
INTRAMUSCULAR | Status: DC | PRN
  Administered 2014-05-08 (×2): 20 mg via INTRAVENOUS

## 2014-05-08 MED ORDER — PROPOFOL INFUSION 10 MG/ML OPTIME
INTRAVENOUS | Status: DC | PRN
  Administered 2014-05-08: 100 ug/kg/min via INTRAVENOUS

## 2014-05-08 MED ORDER — EPHEDRINE SULFATE 50 MG/ML IJ SOLN
INTRAMUSCULAR | Status: DC | PRN
  Administered 2014-05-08: 10 mg via INTRAVENOUS

## 2014-05-08 MED ORDER — MIDAZOLAM HCL 2 MG/2ML IJ SOLN
INTRAMUSCULAR | Status: AC
  Filled 2014-05-08: qty 2

## 2014-05-08 MED ORDER — MIDAZOLAM HCL 5 MG/5ML IJ SOLN
INTRAMUSCULAR | Status: DC | PRN
  Administered 2014-05-08: 2 mg via INTRAVENOUS

## 2014-05-08 MED ORDER — KETAMINE HCL 10 MG/ML IJ SOLN
INTRAMUSCULAR | Status: AC
  Filled 2014-05-08: qty 1

## 2014-05-08 MED ORDER — ONDANSETRON HCL 4 MG/2ML IJ SOLN
INTRAMUSCULAR | Status: AC
  Filled 2014-05-08: qty 2

## 2014-05-08 MED ORDER — LACTATED RINGERS IV SOLN
INTRAVENOUS | Status: DC | PRN
  Administered 2014-05-08: 12:00:00 via INTRAVENOUS

## 2014-05-08 MED ORDER — PROPOFOL 10 MG/ML IV BOLUS
INTRAVENOUS | Status: DC | PRN
  Administered 2014-05-08 (×2): 20 mg via INTRAVENOUS

## 2014-05-08 MED ORDER — LIDOCAINE HCL (CARDIAC) 20 MG/ML IV SOLN
INTRAVENOUS | Status: AC
  Filled 2014-05-08: qty 5

## 2014-05-08 NOTE — Op Note (Signed)
[place], [address]   ENDOSCOPY PROCEDURE REPORT  PATIENT: May, Christy  MR#: [id number] BIRTHDATE: 02-12-1951 , 63  yrs. old GENDER: female ENDOSCOPIST: [name], MD REFERRED BY:  Cathlean Cower, M.D. PROCEDURE DATE:  05/08/2014 PROCEDURE:  EGD w/ biopsy ASA CLASS:     Class IV INDICATIONS:  gastroesophageal reflux.  Last endoscopy in 2009 showed hiatal hernia.  She has been having cough on a maximum medical therapy.  she has been scheduled for a Bravo probe on medications ( Nexiem 40 bid and Pepcid in mid day). MEDICATIONS: Monitored anesthesia care TOPICAL ANESTHETIC: none  DESCRIPTION OF PROCEDURE: After the risks benefits and alternatives of the procedure were thoroughly explained, informed consent was obtained.  The    endoscope was introduced through the mouth and advanced to the second portion of the duodenum , Without limitations.  The instrument was slowly withdrawn as the mucosa was fully examined.    Esophagus: proximal and mid esophageal mucosa was normal. There was small short erosion at the GE junction which was covered with exudate., Squamocolumnar junction was regular.,Located at 41 cm from the incisors It was no stricture. There was small 1 cm reducible hiatal hernia Stomach: gastric folds were normal. Gastric antrum showed small circular erosion at the lesser curvature. Biopsies were obtained. Gastric outlet was normal. Retroflexion of the endoscope revealed normal fundus and cardiaDuodenum: duodenal bulb and descending duodenum was normal Bravo probe was placed at 35 cm from the incissors, unfortunately when we  went back with the endoscope to document the placements, the capsule was misplaced into the esophageal lumen. This was the onlu capsule available. [        The scope was then withdrawn from the patient and the procedure completed.  COMPLICATIONS: There were no immediate  complications.  ENDOSCOPIC IMPRESSION: 1. grade 1 esophagitis 2. Normal gastric ulcer at the lesser curvature. Status post biopsies 3.  Unsuccessful attempt for placement of Bravo probe  due to equipment malfunction 4. Pt experienced hypoxia during the procedure and the anesthesia had to use nasal aiway to ventilate the pt  RECOMMENDATIONS: 1.  Anti-reflux regimen to be follow 2.  Continue PPI 3.  Await pathology results 4.  Proceed with a Colonoscopy.  REPEAT EXAM: pending biopsy results  eSigned:  [name], MD 05/08/2014 12:50 PM    CC:  PATIENT NAME:  Christy, May MR#: [id number]

## 2014-05-08 NOTE — Op Note (Signed)
[facility] [place], [address]   COLONOSCOPY PROCEDURE REPORT  PATIENT: Christy May, Christy May  MR#: [id number] BIRTHDATE: [birth year] , 20  yrs. old GENDER: female ENDOSCOPIST: [name], MD REFERRED JK:QASUO John, M.D. PROCEDURE DATE:  05/08/2014 PROCEDURE:   Colonoscopy with snare polypectomy First Screening Colonoscopy - Avg.  risk and is 50 yrs.  old or older Yes.  Prior Negative Screening - Now for repeat screening. Less than 10 yrs Prior Negative Screening - Now for repeat screening.  Inadequate prep  History of Adenoma - Now for follow-up colonoscopy & has been > or = to 3 yrs.  N/A  Polyps Removed Today? Yes. ASA CLASS:   Class IV INDICATIONS:average risk for colon cancer and last colonoscopy August 2007 was a normal exam. MEDICATIONS: Monitored anesthesia care  DESCRIPTION OF PROCEDURE:   After the risks benefits and alternatives of the procedure were thoroughly explained, informed consent was obtained.  The digital rectal exam revealed no abnormalities of the rectum.   The Pentax Ped Colon [id number] endoscope was introduced through the anus and advanced to the cecum, which was identified by both the appendix and ileocecal valve. No adverse events experienced.   The quality of the prep was good, using MoviPrep  The instrument was then slowly withdrawn as the colon was fully examined.      COLON FINDINGS: A sessile polyp measuring 5 mm in size was found in the sigmoid colon.  A polypectomy was performed with a cold snare. The resection was complete but the polyp tissue was not retrieved. Retroflexed views revealed no abnormalities. The time to cecum=9 minutes 00 seconds.  Withdrawal time=10 minutes 00 seconds.  The scope was withdrawn and the procedure completed. COMPLICATIONS: There were no immediate complications.  ENDOSCOPIC IMPRESSION: Sessile polyp was found in the sigmoid colon; polypectomy was performed with a cold snare  polyp was not retrieved  RECOMMENDATIONS: high fiber diet no recall colon due to respiratory insufficiency, pt had hypoxic episode during the procedure ,monitored by CRNA and Dr Landry Dyke, anesthesia  eSigned:  [name], MD 05/08/2014 1:02 PM   cc:

## 2014-05-08 NOTE — Progress Notes (Signed)
Pt appropriate, getting dressed with assist from sister. VSS. Ready for d/c to home.

## 2014-05-08 NOTE — Progress Notes (Signed)
Pt appears to be having confusion post procedure.  Sister does not seem concerned, but Dr.Brodie ask that we observe her for another hour to be sure she is back to baselines. VSS. Pt laughing, talking with her sister, and appears to be oriented and appropriate when ask orientation questions by staff. Pt up, walking to bathroom without difficulty, and sister assisting her to get dressed.

## 2014-05-08 NOTE — Anesthesia Preprocedure Evaluation (Addendum)
Anesthesia Evaluation  Patient identified by MRN, date of birth, ID band Patient awake    Reviewed: Allergy & Precautions, H&P , NPO status , Patient's Chart, lab work & pertinent test results, reviewed documented beta blocker date and time   Airway Mallampati: III  TM Distance: >3 FB Neck ROM: full    Dental no notable dental hx.    Pulmonary asthma , sleep apnea , COPD COPD inhaler, former smoker,  breath sounds clear to auscultation  Pulmonary exam normal       Cardiovascular hypertension, Pt. on medications and Pt. on home beta blockers Rhythm:regular Rate:Normal     Neuro/Psych negative neurological ROS  negative psych ROS   GI/Hepatic negative GI ROS, Neg liver ROS,   Endo/Other  diabetes, Well Controlled, Type 2, Insulin DependentMorbid obesity  Renal/GU negative Renal ROS  negative genitourinary   Musculoskeletal   Abdominal (+) + obese,   Peds  Hematology negative hematology ROS (+)   Anesthesia Other Findings   Reproductive/Obstetrics negative OB ROS                           Anesthesia Physical Anesthesia Plan  ASA: IV  Anesthesia Plan: MAC   Post-op Pain Management:    Induction:   Airway Management Planned:   Additional Equipment:   Intra-op Plan:   Post-operative Plan:   Informed Consent: I have reviewed the patients History and Physical, chart, labs and discussed the procedure including the risks, benefits and alternatives for the proposed anesthesia with the patient or authorized representative who has indicated his/her understanding and acceptance.   Dental Advisory Given  Plan Discussed with: CRNA and Surgeon  Anesthesia Plan Comments:        Anesthesia Quick Evaluation

## 2014-05-08 NOTE — Discharge Instructions (Signed)
Colonoscopy, Care After °Refer to this sheet in the next few weeks. These instructions provide you with information on caring for yourself after your procedure. Your health care provider may also give you more specific instructions. Your treatment has been planned according to current medical practices, but problems sometimes occur. Call your health care provider if you have any problems or questions after your procedure. °WHAT TO EXPECT AFTER THE PROCEDURE  °After your procedure, it is typical to have the following: °· A small amount of blood in your stool. °· Moderate amounts of gas and mild abdominal cramping or bloating. °HOME CARE INSTRUCTIONS °· Do not drive, operate machinery, or sign important documents for 24 hours. °· You may shower and resume your regular physical activities, but move at a slower pace for the first 24 hours. °· Take frequent rest periods for the first 24 hours. °· Walk around or put a warm pack on your abdomen to help reduce abdominal cramping and bloating. °· Drink enough fluids to keep your urine clear or pale yellow. °· You may resume your normal diet as instructed by your health care provider. Avoid heavy or fried foods that are hard to digest. °· Avoid drinking alcohol for 24 hours or as instructed by your health care provider. °· Only take over-the-counter or prescription medicines as directed by your health care provider. °· If a tissue sample (biopsy) was taken during your procedure: °· Do not take aspirin or blood thinners for 7 days, or as instructed by your health care provider. °· Do not drink alcohol for 7 days, or as instructed by your health care provider. °· Eat soft foods for the first 24 hours. °SEEK MEDICAL CARE IF: °You have persistent spotting of blood in your stool 2-3 days after the procedure. °SEEK IMMEDIATE MEDICAL CARE IF: °· You have more than a small spotting of blood in your stool. °· You pass large blood clots in your stool. °· Your abdomen is swollen  (distended). °· You have nausea or vomiting. °· You have a fever. °· You have increasing abdominal pain that is not relieved with medicine. °Document Released: 01/21/2004 Document Revised: 03/29/2013 Document Reviewed: 02/13/2013 °ExitCare® Patient Information ©2015 [employer]. This information is not intended to replace advice given to you by your health care provider. Make sure you discuss any questions you have with your health care provider. °Esophagogastroduodenoscopy °Care After °Refer to this sheet in the next few weeks. These instructions provide you with information on caring for yourself after your procedure. Your caregiver may also give you more specific instructions. Your treatment has been planned according to current medical practices, but problems sometimes occur. Call your caregiver if you have any problems or questions after your procedure.  °HOME CARE INSTRUCTIONS °· Do not eat or drink anything until the numbing medicine (local anesthetic) has worn off and your gag reflex has returned. You will know that the local anesthetic has worn off when you can swallow comfortably. °· Do not drive for 12 hours after the procedure or as directed by your caregiver. °· Only take medicines as directed by your caregiver. °SEEK MEDICAL CARE IF:  °· You cannot stop coughing. °· You are not urinating at all or less than usual. °SEEK IMMEDIATE MEDICAL CARE IF: °· You have difficulty swallowing. °· You cannot eat or drink. °· You have worsening throat or chest pain. °· You have dizziness, lightheadedness, or you faint. °· You have nausea or vomiting. °· You have chills. °· You have a   fever. °· You have severe abdominal pain. °· You have black, tarry, or bloody stools. °Document Released: 05/25/2012 Document Reviewed: 05/25/2012 °ExitCare® Patient Information ©2015 [employer]. This information is not intended to replace advice given to you by your health care provider. Make sure you discuss any questions you have  with your health care provider. ° ° °

## 2014-05-08 NOTE — Anesthesia Postprocedure Evaluation (Signed)
  Anesthesia Post-op Note  Patient: Christy May  Procedure(s) Performed: Procedure(s) (LRB): ESOPHAGOGASTRODUODENOSCOPY (EGD) (N/A) COLONOSCOPY (N/A)  Patient Location: PACU  Anesthesia Type: MAC  Level of Consciousness: awake and alert   Airway and Oxygen Therapy: Patient Spontanous Breathing  Post-op Pain: mild  Post-op Assessment: Post-op Vital signs reviewed, Patient's Cardiovascular Status Stable, Respiratory Function Stable, Patent Airway and No signs of Nausea or vomiting  Last Vitals:  Filed Vitals:   05/08/14 1300  BP: 154/78  Pulse: 96  Temp:   Resp: 17    Post-op Vital Signs: stable   Complications: No apparent anesthesia complications

## 2014-05-08 NOTE — Interval H&P Note (Signed)
History and Physical Interval Note:  05/08/2014 11:09 AM  Christy May  has presented today for surgery, with the diagnosis of Change in bowel habits  Gerd  Cough   The various methods of treatment have been discussed with the patient and family. After consideration of risks, benefits and other options for treatment, the patient has consented to  Procedure(s): ESOPHAGOGASTRODUODENOSCOPY (EGD) (N/A) COLONOSCOPY (N/A) BRAVO PH STUDY (N/A) as a surgical intervention .  The patient's history has been reviewed, patient examined, no change in status, stable for surgery.  I have reviewed the patient's chart and labs.  Questions were answered to the patient's satisfaction.     Delfin Edis

## 2014-05-08 NOTE — Transfer of Care (Signed)
Immediate Anesthesia Transfer of Care Note  Patient: Christy May  Procedure(s) Performed: Procedure(s) (LRB): ESOPHAGOGASTRODUODENOSCOPY (EGD) (N/A) COLONOSCOPY (N/A)  Patient Location: Endoscopy  Anesthesia Type: MAC  Level of Consciousness: sedated, patient responds to stimuli.  Airway & Oxygen Therapy: Patient Spontanous Breathing and Patient connected to face mask oxgen  Post-op Assessment: Report given to PACU RN and Post -op Vital signs reviewed and stable  Post vital signs: Reviewed and stable  Complications: No apparent anesthesia complications

## 2014-05-09 ENCOUNTER — Encounter: Payer: Self-pay | Admitting: Internal Medicine

## 2014-05-09 ENCOUNTER — Encounter (HOSPITAL_COMMUNITY): Payer: Self-pay | Admitting: Internal Medicine

## 2014-05-10 ENCOUNTER — Other Ambulatory Visit: Payer: Self-pay | Admitting: Internal Medicine

## 2014-05-10 NOTE — Interval H&P Note (Signed)
History and Physical Interval Note:  05/10/2014 11:25 AM  Christy May  has presented today for surgery, with the diagnosis of Change in bowel habits  Gerd  Cough   The various methods of treatment have been discussed with the patient and family. After consideration of risks, benefits and other options for treatment, the patient has consented to  Procedure(s): ESOPHAGOGASTRODUODENOSCOPY (EGD) (N/A) COLONOSCOPY (N/A) as a surgical intervention .  The patient's history has been reviewed, patient examined, no change in status, stable for surgery.  I have reviewed the patient's chart and labs.  Questions were answered to the patient's satisfaction.     Delfin Edis

## 2014-05-11 ENCOUNTER — Ambulatory Visit: Payer: Medicare Other | Admitting: Family

## 2014-05-14 ENCOUNTER — Telehealth: Payer: Self-pay | Admitting: Internal Medicine

## 2014-05-14 NOTE — Telephone Encounter (Signed)
Spoke with patient and gave her path results as per letter. Mailed out a copy of procedures note per patient request. She will talk with pulmonary re: oxygen.

## 2014-05-15 ENCOUNTER — Telehealth: Payer: Self-pay | Admitting: Internal Medicine

## 2014-05-15 NOTE — Telephone Encounter (Signed)
Ok to forward to Dr Lamonte Sakai whom I think handles this. thanks

## 2014-05-15 NOTE — Telephone Encounter (Signed)
Pt called and said that [facility] needs another order for her oxygen.       [phone]

## 2014-05-21 ENCOUNTER — Telehealth: Payer: Self-pay | Admitting: Emergency Medicine

## 2014-05-21 DIAGNOSIS — G4730 Sleep apnea, unspecified: Secondary | ICD-10-CM

## 2014-05-21 NOTE — Telephone Encounter (Signed)
I spoke with the pt and she states she recently had a colonoscopy with [name] and her oxygen levels dropped while she was being put to sleep and [name] recommended the pt get back on oxygen at bedtime. The pt states she used to be on oxygen years ago with sleep. She does have OSA and is on a cpap for this. She has used [facility] in the past and would like to go through them again. An ONO on room air would have to be ordered to qualify the pt for this. Please advise.  [place] Bing, CMA

## 2014-05-21 NOTE — Telephone Encounter (Signed)
Not clear to me that she will need oxygen bled into her CPAP. Please order an overnight oximetry with her CPAP on and on room air. thanks

## 2014-05-21 NOTE — Telephone Encounter (Signed)
Then she needs an ONO on RA (no CPAP) in order to qualify

## 2014-05-21 NOTE — Telephone Encounter (Signed)
Please call patient and/or inquire as to which order is needed  - thank you

## 2014-05-21 NOTE — Telephone Encounter (Signed)
I will create a phone note from our office. [place] Bing, CMA

## 2014-05-21 NOTE — Telephone Encounter (Signed)
Spoke with pt, advised her of the rec's per Dr Lamonte Sakai. Pt states that she is only wearing O2 at night-- unable to wear CPAP at night, (too noisy, uncomfortable and would not stay on face), patient stopped using this when she started using O2 2L at night. Pt states that she is not wanting to try the CPAP again. Requesting an updated Rx for O2 qhs and supplies.  Pt states that Dr Leonel Ramsay (Pulmonary/ENT) was the Dr that ordered the O2 in 2004/2005. Pt states that she does not remember ever having a test to confirm that she indeed needs to O2 at night. Would like to continue using at night as she has been doing without the CPAP. Please advise Dr Lamonte Sakai. Thanks.

## 2014-05-22 NOTE — Telephone Encounter (Signed)
Order for ONO placed (on Room Air, off CPAP) Pt aware.  Nothing further needed.

## 2014-05-23 ENCOUNTER — Other Ambulatory Visit (INDEPENDENT_AMBULATORY_CARE_PROVIDER_SITE_OTHER): Payer: Medicare Other

## 2014-05-23 ENCOUNTER — Ambulatory Visit (INDEPENDENT_AMBULATORY_CARE_PROVIDER_SITE_OTHER)
Admission: RE | Admit: 2014-05-23 | Discharge: 2014-05-23 | Disposition: A | Discharge status: Home / Self Care | Payer: Medicare Other | Source: Ambulatory Visit | Attending: Internal Medicine | Admitting: Internal Medicine

## 2014-05-23 ENCOUNTER — Ambulatory Visit (INDEPENDENT_AMBULATORY_CARE_PROVIDER_SITE_OTHER): Payer: Medicare Other | Admitting: Internal Medicine

## 2014-05-23 ENCOUNTER — Encounter: Payer: Self-pay | Admitting: Internal Medicine

## 2014-05-23 VITALS — BP 120/62 | HR 86 | Temp 98.0°F | Resp 16 | Ht 64.0 in

## 2014-05-23 DIAGNOSIS — E1049 Type 1 diabetes mellitus with other diabetic neurological complication: Secondary | ICD-10-CM

## 2014-05-23 DIAGNOSIS — E1065 Type 1 diabetes mellitus with hyperglycemia: Principal | ICD-10-CM

## 2014-05-23 DIAGNOSIS — E1041 Type 1 diabetes mellitus with diabetic mononeuropathy: Secondary | ICD-10-CM

## 2014-05-23 DIAGNOSIS — R059 Cough, unspecified: Secondary | ICD-10-CM

## 2014-05-23 DIAGNOSIS — I10 Essential (primary) hypertension: Secondary | ICD-10-CM | POA: Diagnosis not present

## 2014-05-23 DIAGNOSIS — I517 Cardiomegaly: Secondary | ICD-10-CM | POA: Diagnosis not present

## 2014-05-23 DIAGNOSIS — IMO0002 Reserved for concepts with insufficient information to code with codable children: Secondary | ICD-10-CM

## 2014-05-23 DIAGNOSIS — R079 Chest pain, unspecified: Secondary | ICD-10-CM | POA: Diagnosis not present

## 2014-05-23 DIAGNOSIS — R0602 Shortness of breath: Secondary | ICD-10-CM | POA: Diagnosis not present

## 2014-05-23 DIAGNOSIS — N179 Acute kidney failure, unspecified: Secondary | ICD-10-CM

## 2014-05-23 DIAGNOSIS — R05 Cough: Secondary | ICD-10-CM

## 2014-05-23 LAB — COMPREHENSIVE METABOLIC PANEL
ALT: 18 U/L (ref 0–35)
AST: 22 U/L (ref 0–37)
Albumin: 3.4 g/dL — ABNORMAL LOW (ref 3.5–5.2)
Alkaline Phosphatase: 75 U/L (ref 39–117)
BUN: 20 mg/dL (ref 6–23)
CO2: 29 mEq/L (ref 19–32)
CREATININE: 1.3 mg/dL — AB (ref 0.4–1.2)
Calcium: 9.4 mg/dL (ref 8.4–10.5)
Chloride: 99 mEq/L (ref 96–112)
GFR: 54.4 mL/min — ABNORMAL LOW (ref >60.00)
GLUCOSE: 259 mg/dL — AB (ref 70–99)
Potassium: 4.6 mEq/L (ref 3.5–5.1)
Sodium: 136 mEq/L (ref 135–145)
Total Bilirubin: 0.4 mg/dL (ref 0.2–1.2)
Total Protein: 7.4 g/dL (ref 6.0–8.3)

## 2014-05-23 LAB — HEMOGLOBIN A1C: Hgb A1c MFr Bld: 8.8 % — ABNORMAL HIGH (ref 4.6–6.5)

## 2014-05-23 LAB — CBC WITH DIFFERENTIAL/PLATELET
Basophils Absolute: 0 10*3/uL (ref 0.0–0.1)
Basophils Relative: 0.5 % (ref 0.0–3.0)
EOS ABS: 0.2 10*3/uL (ref 0.0–0.7)
EOS PCT: 2.8 % (ref 0.0–5.0)
HEMATOCRIT: 37.2 % (ref 36.0–46.0)
Hemoglobin: 11.7 g/dL — ABNORMAL LOW (ref 12.0–15.0)
LYMPHS ABS: 1.9 10*3/uL (ref 0.7–4.0)
Lymphocytes Relative: 29.5 % (ref 12.0–46.0)
MCHC: 31.5 g/dL (ref 30.0–36.0)
MCV: 83.2 fl (ref 78.0–100.0)
MONO ABS: 0.4 10*3/uL (ref 0.1–1.0)
Monocytes Relative: 6.8 % (ref 3.0–12.0)
NEUTROS ABS: 3.9 10*3/uL (ref 1.4–7.7)
Neutrophils Relative %: 60.4 % (ref 43.0–77.0)
Platelets: 232 10*3/uL (ref 150.0–400.0)
RBC: 4.47 Mil/uL (ref 3.87–5.11)
RDW: 16 % — ABNORMAL HIGH (ref 11.5–15.5)
WBC: 6.5 10*3/uL (ref 4.0–10.5)

## 2014-05-23 LAB — TSH: TSH: 3.44 u[IU]/mL (ref 0.35–4.50)

## 2014-05-23 NOTE — Patient Instructions (Signed)
Cough, Adult  A cough is a reflex that helps clear your throat and airways. It can help heal the body or may be a reaction to an irritated airway. A cough may only last 2 or 3 weeks (acute) or may last more than 8 weeks (chronic).  CAUSES Acute cough:  Viral or bacterial infections. Chronic cough:  Infections.  Allergies.  Asthma.  Post-nasal drip.  Smoking.  Heartburn or acid reflux.  Some medicines.  Chronic lung problems (COPD).  Cancer. SYMPTOMS   Cough.  Fever.  Chest pain.  Increased breathing rate.  High-pitched whistling sound when breathing (wheezing).  Colored mucus that you cough up (sputum). TREATMENT   A bacterial cough may be treated with antibiotic medicine.  A viral cough must run its course and will not respond to antibiotics.  Your caregiver may recommend other treatments if you have a chronic cough. HOME CARE INSTRUCTIONS   Only take over-the-counter or prescription medicines for pain, discomfort, or fever as directed by your caregiver. Use cough suppressants only as directed by your caregiver.  Use a cold steam vaporizer or humidifier in your bedroom or home to help loosen secretions.  Sleep in a semi-upright position if your cough is worse at night.  Rest as needed.  Stop smoking if you smoke. SEEK IMMEDIATE MEDICAL CARE IF:   You have pus in your sputum.  Your cough starts to worsen.  You cannot control your cough with suppressants and are losing sleep.  You begin coughing up blood.  You have difficulty breathing.  You develop pain which is getting worse or is uncontrolled with medicine.  You have a fever. MAKE SURE YOU:   Understand these instructions.  Will watch your condition.  Will get help right away if you are not doing well or get worse. Document Released: 12/05/2010 Document Revised: 08/31/2011 Document Reviewed: 12/05/2010 ExitCare Patient Information 2015 [employer]. This information is not intended  to replace advice given to you by your health care provider. Make sure you discuss any questions you have with your health care provider.  

## 2014-05-23 NOTE — Progress Notes (Signed)
Subjective:    Patient ID: Christy May, female    DOB: 1950-10-31, 63 y.o.   MRN: [id number]  Cough This is a chronic problem. The current episode started more than 1 year ago. The problem has been unchanged. The problem occurs every few hours. The cough is non-productive. Associated symptoms include shortness of breath and wheezing. Pertinent negatives include no chest pain, chills, ear congestion, ear pain, fever, headaches, heartburn, hemoptysis, myalgias, nasal congestion, postnasal drip, rash, rhinorrhea, sore throat, sweats or weight loss. Nothing aggravates the symptoms. She has tried prescription cough suppressant for the symptoms. The treatment provided mild relief.      Review of Systems  Constitutional: Positive for fatigue. Negative for fever, chills, weight loss, diaphoresis and appetite change.  HENT: Negative.  Negative for ear pain, postnasal drip, rhinorrhea, sore throat and trouble swallowing.   Eyes: Negative.   Respiratory: Positive for cough, shortness of breath and wheezing. Negative for apnea, hemoptysis, choking, chest tightness and stridor.   Cardiovascular: Negative.  Negative for chest pain, palpitations and leg swelling.  Gastrointestinal: Negative.  Negative for heartburn, nausea, abdominal pain, diarrhea, constipation and blood in stool.  Endocrine: Negative.   Genitourinary: Negative.   Musculoskeletal: Negative.  Negative for myalgias, back pain and arthralgias.  Skin: Negative.  Negative for rash.  Allergic/Immunologic: Negative.   Neurological: Negative.  Negative for headaches.  Hematological: Negative.  Negative for adenopathy. Does not bruise/bleed easily.  Psychiatric/Behavioral: Negative.        Objective:   Physical Exam  Constitutional: She is oriented to person, place, and time. She appears well-developed and well-nourished. No distress.  Morbidly obese with a pickwickian appearance Has trouble getting on the exam table Has sounds of  forced expiration  HENT:  Head: Normocephalic and atraumatic.  Mouth/Throat: Oropharynx is clear and moist. No oropharyngeal exudate.  Eyes: Conjunctivae are normal. Right eye exhibits no discharge. Left eye exhibits no discharge. No scleral icterus.  Neck: Normal range of motion. Neck supple. No JVD present. No tracheal deviation present. No thyromegaly present.  Cardiovascular: Normal rate, regular rhythm, S1 normal, S2 normal and intact distal pulses.  Exam reveals no gallop, no S3, no S4, no distant heart sounds and no friction rub.   Murmur heard.  Systolic murmur is present with a grade of 1/6   No diastolic murmur is present  Pulmonary/Chest: Effort normal and breath sounds normal. No accessory muscle usage or stridor. No respiratory distress. She has no decreased breath sounds. She has no wheezes. She has no rhonchi. She has no rales.  Abdominal: Soft. Bowel sounds are normal. She exhibits no distension and no mass. There is no tenderness. There is no rebound and no guarding.  Musculoskeletal: Normal range of motion. She exhibits no edema or tenderness.  Lymphadenopathy:    She has no cervical adenopathy.  Neurological: She is oriented to person, place, and time.  Skin: Skin is warm and dry. No rash noted. She is not diaphoretic. No erythema. No pallor.  Vitals reviewed.     Lab Results  Component Value Date   WBC 8.2 10/06/2013   HGB 12.4 10/06/2013   HCT 37.9 10/06/2013   PLT 250.0 10/06/2013   GLUCOSE 332* 10/06/2013   CHOL 171 10/06/2013   TRIG 116.0 10/06/2013   HDL 45.80 10/06/2013   LDLCALC 102* 10/06/2013   ALT 17 10/06/2013   AST 17 10/06/2013   NA 133* 10/06/2013   K 5.3* 10/06/2013   CL 97 10/06/2013   CREATININE  1.5* 10/06/2013   BUN 36* 10/06/2013   CO2 27 10/06/2013   TSH 3.28 10/06/2013   HGBA1C 8.8* 04/18/2014   MICROALBUR 3.2* 02/06/2013      Assessment & Plan:

## 2014-05-23 NOTE — Progress Notes (Signed)
Pre visit review using our clinic review tool, if applicable. No additional management support is needed unless otherwise documented below in the visit note. 

## 2014-05-24 ENCOUNTER — Encounter: Payer: Self-pay | Admitting: Internal Medicine

## 2014-05-25 NOTE — Assessment & Plan Note (Signed)
Her CXR is normal I think her chronic resp s/s are related to her MO, restrictive disease I have asked her to lose weight and to f/up with pulm as directed

## 2014-05-25 NOTE — Assessment & Plan Note (Signed)
Her BP is well controlled Lytes and renal function are stable 

## 2014-05-25 NOTE — Assessment & Plan Note (Signed)
Her renal function has improved 

## 2014-05-30 ENCOUNTER — Ambulatory Visit (INDEPENDENT_AMBULATORY_CARE_PROVIDER_SITE_OTHER): Payer: Medicare Other | Admitting: Internal Medicine

## 2014-05-30 ENCOUNTER — Encounter: Payer: Self-pay | Admitting: Internal Medicine

## 2014-05-30 VITALS — BP 120/80 | HR 94 | Temp 98.4°F | Ht 64.0 in | Wt 331.0 lb

## 2014-05-30 DIAGNOSIS — I10 Essential (primary) hypertension: Secondary | ICD-10-CM

## 2014-05-30 DIAGNOSIS — F329 Major depressive disorder, single episode, unspecified: Secondary | ICD-10-CM | POA: Diagnosis not present

## 2014-05-30 DIAGNOSIS — R059 Cough, unspecified: Secondary | ICD-10-CM

## 2014-05-30 DIAGNOSIS — J069 Acute upper respiratory infection, unspecified: Secondary | ICD-10-CM | POA: Diagnosis not present

## 2014-05-30 DIAGNOSIS — F32A Depression, unspecified: Secondary | ICD-10-CM

## 2014-05-30 DIAGNOSIS — R05 Cough: Secondary | ICD-10-CM | POA: Diagnosis not present

## 2014-05-30 NOTE — Progress Notes (Signed)
Pre visit review using our clinic review tool, if applicable. No additional management support is needed unless otherwise documented below in the visit note. 

## 2014-05-30 NOTE — Assessment & Plan Note (Signed)
Mild to mod, for antibx course,  to f/u any worsening symptoms or concerns 

## 2014-05-30 NOTE — Progress Notes (Signed)
Quick Note:    Discussed at OV  ______

## 2014-05-30 NOTE — Patient Instructions (Addendum)
Please take all new medication as prescribed - the antibiotic  Please continue all other medications as before, and refills have been done if requested.  Please have the pharmacy call with any other refills you may need.  Please continue your efforts at being more active, low cholesterol diet, and weight control.  Please keep your appointments with your specialists as you may have planned  You will be contacted regarding the referral for: speech therapy for ? Hypersensitive cough reflex  No blood work needed today  Please return in 6 months, or sooner if needed

## 2014-05-30 NOTE — Assessment & Plan Note (Signed)
stable overall by history and exam, recent data reviewed with pt, and pt to continue medical treatment as before,  to f/u any worsening symptoms or concerns Lab Results  Component Value Date   WBC 6.5 05/23/2014   HGB 11.7* 05/23/2014   HCT 37.2 05/23/2014   PLT 232.0 05/23/2014   GLUCOSE 259* 05/23/2014   CHOL 171 10/06/2013   TRIG 116.0 10/06/2013   HDL 45.80 10/06/2013   LDLCALC 102* 10/06/2013   ALT 18 05/23/2014   AST 22 05/23/2014   NA 136 05/23/2014   K 4.6 05/23/2014   CL 99 05/23/2014   CREATININE 1.3* 05/23/2014   BUN 20 05/23/2014   CO2 29 05/23/2014   TSH 3.44 05/23/2014   HGBA1C 8.8* 05/23/2014   MICROALBUR 3.2* 02/06/2013

## 2014-05-30 NOTE — Progress Notes (Signed)
Subjective:    Patient ID: Christy May, female    DOB: 30-Sep-1950, 63 y.o.   MRN: [id number]  HPI  Here to f/u;  Here fo rmobility exam, but walks ok with holding on to furniture in the home. No recent falls, has walker as well, with seat in case of fatigue.  Has been seeing pulm for persistent cough. Presents note from Dr Azzie Almas [facility], suggests to see speech therapy regarding possible hypersensitive cough refluex and strategies to learn to reduce this.  Recent EGD per Dr Olevia Perches, now scheduled for ONO (earlobe) soon, f/o nocturnal hypoxemia since she did have some of this with sedation with egd.  Incidentally with persistent 2 wks onset upper resp congestion, feverish, greenish d/c now. With some acute on chronic cough, as well. Some wheeziness with cough, o/w Pt denies chest pain, increased sob or doe, wheezing, orthopnea, PND, increased LE swelling, palpitations, dizziness or syncope..Denies worsening depressive symptoms, suicidal ideation, or panic; has ongoing anxiety, laments her overall condition today, near tearing up Past Medical History  Diagnosis Date  . Anemia, unspecified   . Extrinsic asthma, unspecified   . Chronic airway obstruction, not elsewhere classified   . Spondylosis of unspecified site without mention of myelopathy   . Dehydration   . Depressive disorder, not elsewhere classified   . Type II or unspecified type diabetes mellitus without mention of complication, not stated as uncontrolled   . Esophageal reflux   . Unspecified essential hypertension   . Unspecified menopausal and postmenopausal disorder   . Obesity, unspecified   . Inflammatory and toxic neuropathy, unspecified   . Unspecified sleep apnea   . Unspecified vitamin D deficiency   . Dysphagia 2007    historyof dysphagia with severe dysmotility by barium swallow-[name]  . Obesity   . Hypertension   . Diabetes mellitus without complication   . Allergic bronchitis   . Sleep apnea     . Gout 01/04/2013   Past Surgical History  Procedure Laterality Date  . Laser sugery      bilateral eyes  . Back surgery      fusion-multiple cervical spine levels-Dr. Arnoldo Morale  . Excision of abcess      ?? Chest  . Dilation and curettage of uterus    . Exploratory laparotomy    . Partial hysterectomy    . Total knee arthroplasty  2005    Left  . Total knee arthroplasty  2005    Right- [name]  . Foot surgery      Right X 2  . Cholecystectomy    . Appendectomy    . Tonsillectomy    . Abdominal hysterectomy    . Esophagogastroduodenoscopy N/A 05/08/2014    Procedure: ESOPHAGOGASTRODUODENOSCOPY (EGD);  Surgeon: [name], MD;  Location: [name];  Service: Endoscopy;  Laterality: N/A;  . Colonoscopy N/A 05/08/2014    Procedure: COLONOSCOPY;  Surgeon: [name], MD;  Location: [facility];  Service: Endoscopy;  Laterality: N/A;    reports that she quit smoking about 27 years ago. She has never used smokeless tobacco. She reports that she does not drink alcohol or use illicit drugs. family history includes Colon polyps in her brother; Diabetes in her sister and another family member; Esophageal cancer in her brother and sister; Heart disease in her maternal grandfather; Pancreatic cancer in her sister. Allergies  Allergen Reactions  . Ace Inhibitors Anaphylaxis  . Penicillins Hives and Rash  . Tape Hives  Paper tape is ok to use  . Adhesive [Tape] Rash   Current Outpatient Prescriptions on File Prior to Visit  Medication Sig Dispense Refill  . albuterol (PROVENTIL) (2.5 MG/3ML) 0.083% nebulizer solution Take 2.5 mg by nebulization every 6 (six) hours as needed for wheezing or shortness of breath.    Marland Kitchen aspirin 81 MG tablet Take 81 mg by mouth at bedtime.     . cholecalciferol (VITAMIN D) 1000 UNITS tablet Take 2,000 Units by mouth every morning.     . cyclobenzaprine (FLEXERIL) 5 MG tablet TAKE 1 TABLET BY MOUTH THREE TIMES DAILY AS NEEDED FOR MUSCLE SPASM 90  tablet 0  . dextromethorphan (DELSYM) 30 MG/5ML liquid Take 30 mg by mouth 3 (three) times daily as needed for cough.    . diclofenac sodium (VOLTAREN) 1 % GEL Apply 4 g topically 4 (four) times daily as needed (right hand, arm and neck pain.).    Marland Kitchen esomeprazole (NEXIUM) 40 MG capsule Take 1 capsule (40 mg total) by mouth 2 (two) times daily. 60 capsule 3  . famotidine (PEPCID) 20 MG tablet Take 1 tablet (20 mg total) by mouth at bedtime. 30 tablet 3  . fluticasone (FLONASE) 50 MCG/ACT nasal spray Place 2 sprays into both nostrils every morning. 16 g 6  . gabapentin (NEURONTIN) 800 MG tablet Take 800 mg by mouth 3 (three) times daily.    Marland Kitchen glucose blood (ONE TOUCH ULTRA TEST) test strip Use to test blood sugars twice a day. Dx 250.63 100 each 12  . HYDROcodone-homatropine (HYCODAN) 5-1.5 MG/5ML syrup Take 5 mLs by mouth every 6 (six) hours as needed for cough. 480 mL 0  . Insulin Glargine (LANTUS) 100 UNIT/ML Solostar Pen Inject 300 Units into the skin every morning. And pen needles 2/day    . irbesartan (AVAPRO) 300 MG tablet Take 300 mg by mouth every morning.     . Lancets (ONETOUCH ULTRASOFT) lancets Use to test blood sugars twice a day. 100 each 12  . loratadine (CLARITIN) 10 MG tablet Take 10 mg by mouth every morning.    . metoprolol (LOPRESSOR) 50 MG tablet Take 75 mg by mouth 2 (two) times daily.    . montelukast (SINGULAIR) 10 MG tablet Take 10 mg by mouth at bedtime.    Marland Kitchen nystatin cream (MYCOSTATIN) Apply to affected area 2 times daily 30 g 0  . oxybutynin (DITROPAN-XL) 10 MG 24 hr tablet Take 10 mg by mouth every morning.     . promethazine (PHENERGAN) 25 MG tablet TAKE 1 TABLET BY MOUTH EVERY 6 HOURS AS NEEDED FOR NASUEA/VOMITING 30 tablet 0   No current facility-administered medications on file prior to visit.    Review of Systems  Constitutional: Negative for unusual diaphoresis or other sweats  HENT: Negative for ringing in ear Eyes: Negative for double vision or worsening  visual disturbance.  Respiratory: Negative for choking and stridor.   Gastrointestinal: Negative for vomiting or other signifcant bowel change Genitourinary: Negative for hematuria or decreased urine volume.  Musculoskeletal: Negative for other MSK pain or swelling Skin: Negative for color change and worsening wound.  Neurological: Negative for tremors and numbness other than noted  Psychiatric/Behavioral: Negative for decreased concentration or agitation other than above       Objective:   Physical Exam BP 120/80 mmHg  Pulse 94  Temp(Src) 98.4 F (36.9 C) (Oral)  Ht 5\' 4"  (1.626 m)  Wt 331 lb (150.141 kg)  BMI 56.79 kg/m2  SpO2 97% VS noted,  mild ill Constitutional: Pt appears well-developed, well-nourished.  HENT: Head: NCAT.  Right Ear: External ear normal.  Left Ear: External ear normal.  Eyes: . Pupils are equal, round, and reactive to light. Conjunctivae and EOM are normal Neck: Normal range of motion. Neck supple.  Cardiovascular: Normal rate and regular rhythm.   Bilat tm's with mild erythema.  Max sinus areas non tender.  Pharynx with mild erythema, no exudate Pulmonary/Chest: Effort normal and breath sounds decreased but no rales or wheezing.  Neurological: Pt is alert. Not confused , motor grossly intact Skin: Skin is warm. No rash Psychiatric: Pt behavior is normal. No agitation. borderline tearful today Wt Readings from Last 3 Encounters:  05/30/14 331 lb (150.141 kg)  04/20/14 336 lb 11 oz (152.72 kg)  04/18/14 337 lb (152.862 kg)        Assessment & Plan:

## 2014-05-30 NOTE — Assessment & Plan Note (Signed)
stable overall by history and exam, recent data reviewed with pt, and pt to continue medical treatment as before,  to f/u any worsening symptoms or concerns BP Readings from Last 3 Encounters:  05/30/14 120/80  05/23/14 120/62  05/08/14 151/39

## 2014-05-30 NOTE — Assessment & Plan Note (Signed)
Chronic - for speech therapy eval - ? hypersent cough reflex?

## 2014-05-31 ENCOUNTER — Telehealth: Payer: Self-pay | Admitting: Internal Medicine

## 2014-05-31 MED ORDER — AZITHROMYCIN 250 MG PO TABS: ORAL_TABLET | ORAL | Status: DC

## 2014-05-31 NOTE — Telephone Encounter (Signed)
rx sent, - done erx

## 2014-05-31 NOTE — Telephone Encounter (Signed)
Patient states Dr. Jenny Reichmann was to call her in an antibiotic to [facility] on [facility] yesterday but one was never sent.  Patient is requesting call once one has been sent.

## 2014-06-01 ENCOUNTER — Telehealth: Payer: Self-pay | Admitting: Internal Medicine

## 2014-06-01 NOTE — Telephone Encounter (Signed)
emmi emailed °

## 2014-06-06 ENCOUNTER — Other Ambulatory Visit: Payer: Self-pay | Admitting: Internal Medicine

## 2014-06-20 ENCOUNTER — Telehealth: Payer: Self-pay | Admitting: Emergency Medicine

## 2014-06-20 MED ORDER — HYDROCODONE-HOMATROPINE 5-1.5 MG/5ML PO SYRP: 5.0000 mL | ORAL_SOLUTION | Freq: Four times a day (QID) | ORAL | Status: DC | PRN

## 2014-06-20 NOTE — Telephone Encounter (Signed)
Yes Ok to refill. Also make sure we have an OV in future to discuss other avenues to treat

## 2014-06-20 NOTE — Telephone Encounter (Signed)
Spoke with the pt and scheduled appt with RB for 08/09/14  Rx signed and ready for her to pick up  Nothing further needed per pt

## 2014-06-20 NOTE — Telephone Encounter (Signed)
Called and spoke with pt and she is taking the delsym and the rx cough medication.  She stated that her cough never went away.  She is needing a refill of the rx cough medication.  RB please advise if ok to refill so the pt may pick this up today.  Thanks  Allergies  Allergen Reactions  . Ace Inhibitors Anaphylaxis  . Penicillins Hives and Rash  . Tape Hives    Paper tape is ok to use  . Adhesive [Tape] Rash    Current Outpatient Prescriptions on File Prior to Visit  Medication Sig Dispense Refill  . albuterol (PROVENTIL) (2.5 MG/3ML) 0.083% nebulizer solution Take 2.5 mg by nebulization every 6 (six) hours as needed for wheezing or shortness of breath.    Marland Kitchen aspirin 81 MG tablet Take 81 mg by mouth at bedtime.     Marland Kitchen azithromycin (ZITHROMAX Z-PAK) 250 MG tablet Use as directed 6 tablet 1  . cholecalciferol (VITAMIN D) 1000 UNITS tablet Take 2,000 Units by mouth every morning.     . cyclobenzaprine (FLEXERIL) 5 MG tablet TAKE 1 TABLET BY MOUTH THREE TIMES DAILY AS NEEDED FOR MUSCLE SPASMS 90 tablet 0  . dextromethorphan (DELSYM) 30 MG/5ML liquid Take 30 mg by mouth 3 (three) times daily as needed for cough.    . diclofenac sodium (VOLTAREN) 1 % GEL Apply 4 g topically 4 (four) times daily as needed (right hand, arm and neck pain.).    Marland Kitchen esomeprazole (NEXIUM) 40 MG capsule Take 1 capsule (40 mg total) by mouth 2 (two) times daily. 60 capsule 3  . famotidine (PEPCID) 20 MG tablet Take 1 tablet (20 mg total) by mouth at bedtime. 30 tablet 3  . fluticasone (FLONASE) 50 MCG/ACT nasal spray Place 2 sprays into both nostrils every morning. 16 g 6  . gabapentin (NEURONTIN) 800 MG tablet Take 800 mg by mouth 3 (three) times daily.    Marland Kitchen glucose blood (ONE TOUCH ULTRA TEST) test strip Use to test blood sugars twice a day. Dx 250.63 100 each 12  . HYDROcodone-homatropine (HYCODAN) 5-1.5 MG/5ML syrup Take 5 mLs by mouth every 6 (six) hours as needed for cough. 480 mL 0  . Insulin Glargine (LANTUS) 100  UNIT/ML Solostar Pen Inject 300 Units into the skin every morning. And pen needles 2/day    . irbesartan (AVAPRO) 300 MG tablet Take 300 mg by mouth every morning.     . Lancets (ONETOUCH ULTRASOFT) lancets Use to test blood sugars twice a day. 100 each 12  . loratadine (CLARITIN) 10 MG tablet Take 10 mg by mouth every morning.    . metoprolol (LOPRESSOR) 50 MG tablet Take 75 mg by mouth 2 (two) times daily.    . montelukast (SINGULAIR) 10 MG tablet Take 10 mg by mouth at bedtime.    Marland Kitchen nystatin cream (MYCOSTATIN) Apply to affected area 2 times daily 30 g 0  . oxybutynin (DITROPAN-XL) 10 MG 24 hr tablet Take 10 mg by mouth every morning.     . promethazine (PHENERGAN) 25 MG tablet TAKE 1 TABLET BY MOUTH EVERY 6 HOURS AS NEEDED FOR FOR NAUSEA AND VOMITING 30 tablet 0   No current facility-administered medications on file prior to visit.

## 2014-06-25 ENCOUNTER — Telehealth: Payer: Self-pay | Admitting: Endocrinology

## 2014-06-25 NOTE — Telephone Encounter (Signed)
Requested call back from pt to discuss.

## 2014-06-25 NOTE — Telephone Encounter (Signed)
See below. I spoke with pt. She states she has been having low readings for about a week now. Pt confirmed that she is taking 300 units of Levemir in the morning.  Please advise, Thanks!

## 2014-06-25 NOTE — Telephone Encounter (Signed)
Patient states that her blood sugars have been dropping   [phone]  Please advise patient   Thank you

## 2014-06-25 NOTE — Telephone Encounter (Signed)
Please reduce to 270 units qam. i'll see you next time.

## 2014-06-26 ENCOUNTER — Other Ambulatory Visit: Payer: Self-pay | Admitting: Internal Medicine

## 2014-06-26 ENCOUNTER — Telehealth: Payer: Self-pay | Admitting: Internal Medicine

## 2014-06-26 NOTE — Telephone Encounter (Signed)
Pt called in wanting to see if Dr Jenny Reichmann can send in a script to [facility] for her oxygen   She also wants result overnight oxygen test she had with [facility]

## 2014-06-26 NOTE — Telephone Encounter (Signed)
Pt advised of note below and voiced understanding.  

## 2014-06-27 NOTE — Telephone Encounter (Signed)
Please advise, thanks.

## 2014-06-27 NOTE — Telephone Encounter (Signed)
Called the patient and she is going to call [facility] to have results sent to PCP.,

## 2014-06-27 NOTE — Telephone Encounter (Signed)
I think we need the results first before the rx, thanks - ok to contact [facility]

## 2014-06-27 NOTE — Telephone Encounter (Signed)
Gabapentin done erx

## 2014-06-29 DIAGNOSIS — G5602 Carpal tunnel syndrome, left upper limb: Secondary | ICD-10-CM | POA: Diagnosis not present

## 2014-07-03 ENCOUNTER — Ambulatory Visit: Payer: Medicare Other | Admitting: Dietician

## 2014-07-04 ENCOUNTER — Telehealth: Payer: Self-pay | Admitting: Endocrinology

## 2014-07-04 NOTE — Telephone Encounter (Signed)
Requested call back to discuss.  

## 2014-07-04 NOTE — Telephone Encounter (Signed)
See below and please advise, Thanks!  

## 2014-07-04 NOTE — Telephone Encounter (Signed)
Please reduce to 220 units qam.

## 2014-07-04 NOTE — Telephone Encounter (Signed)
Monday 119 in am in pm 70 Tuesday am 56  Today am 58  270 u insulin

## 2014-07-05 ENCOUNTER — Ambulatory Visit: Payer: Medicare Other | Admitting: *Deleted

## 2014-07-05 NOTE — Telephone Encounter (Signed)
Pt advised of note below and voiced understanding.  

## 2014-07-10 ENCOUNTER — Telehealth: Payer: Self-pay | Admitting: Internal Medicine

## 2014-07-10 NOTE — Telephone Encounter (Signed)
[place] for robin to call back

## 2014-07-10 NOTE — Telephone Encounter (Signed)
Pt requesting a call regarding her oxygen [phone],

## 2014-07-11 NOTE — Telephone Encounter (Signed)
Agree, pt should f/u with her pulmonary for this issue

## 2014-07-11 NOTE — Telephone Encounter (Signed)
Patient informed. 

## 2014-07-11 NOTE — Telephone Encounter (Signed)
The patient could not tolerate the CPAP.   Her pulmonary doctor had put her on oxygen (Dr. Katherine Roan) only at night. The patient recently had stopped the oxygen thinking she no longer needed, but still does need I informed if pulmonary ordered oxygen she would need to contact them to restart since had to do with sleep apnea.and PCP does not treat.Marland KitchenMarland KitchenAdvise please.Marland Kitchen

## 2014-07-13 ENCOUNTER — Ambulatory Visit: Payer: Medicare Other

## 2014-07-17 ENCOUNTER — Telehealth: Payer: Self-pay | Admitting: Emergency Medicine

## 2014-07-17 NOTE — Telephone Encounter (Signed)
I don't have access to her ONO yet, don't see it resulted. She almost certainly needs CPAP, has been unable to tolerate in the past. She will need an OV to discuss a possible repeat sleep study and CPAP vs oxygen alone.

## 2014-07-17 NOTE — Telephone Encounter (Signed)
lmomtcb x1 for pt on VM 

## 2014-07-17 NOTE — Telephone Encounter (Signed)
Spoke with pt, states she was placed on nocturnal 02 2lpm continuous by Dr. Katherine Roan several years ago after being unable to tolerate a cpap.  Pt had come off 02 but is now wanting to restart 02.  Pt tried going through her PCP Dr. Jenny Reichmann for this, was told to follow up with [place].  Per pt she has recently had an ONO ordered by Dr Jenny Reichmann but is unaware of results.  Pt has not been seen by RB since 02/2014.    Dr. Lamonte Sakai how would you like to proceed with pt getting back on nocturnal 02?  Is this something that can just be ordered or do we need to order anything for her?  Please advise, thanks!

## 2014-07-18 ENCOUNTER — Ambulatory Visit: Payer: Medicare Other | Admitting: Endocrinology

## 2014-07-18 ENCOUNTER — Telehealth: Payer: Self-pay | Admitting: Endocrinology

## 2014-07-18 ENCOUNTER — Ambulatory Visit: Payer: Medicare Other | Admitting: *Deleted

## 2014-07-18 NOTE — Telephone Encounter (Signed)
LMTCB x2  

## 2014-07-18 NOTE — Telephone Encounter (Signed)
Patient no showed today's appt. Please advise on how to follow up. °A. No follow up necessary. °B. Follow up urgent. Contact patient immediately. °C. Follow up necessary. Contact patient and schedule visit in ___ days. °D. Follow up advised. Contact patient and schedule visit in ____weeks. ° °

## 2014-07-19 NOTE — Telephone Encounter (Signed)
Follow up advised. Contact patient and schedule visit in 2 weeks. 

## 2014-07-19 NOTE — Telephone Encounter (Signed)
lmomtcb x1 

## 2014-07-19 NOTE — Telephone Encounter (Signed)
Pt reschedule for 07/25/2014 at 345 pm.

## 2014-07-19 NOTE — Telephone Encounter (Signed)
lmtcb x3 

## 2014-07-19 NOTE — Telephone Encounter (Signed)
Pt returned call [phone]

## 2014-07-20 ENCOUNTER — Telehealth: Payer: Self-pay | Admitting: Endocrinology

## 2014-07-20 MED ORDER — INSULIN GLARGINE 100 UNIT/ML SOLOSTAR PEN: 300.0000 [IU] | PEN_INJECTOR | SUBCUTANEOUS | Status: DC

## 2014-07-20 NOTE — Telephone Encounter (Signed)
lmtcb

## 2014-07-20 NOTE — Telephone Encounter (Signed)
Pt returned call [phone]

## 2014-07-20 NOTE — Telephone Encounter (Signed)
Rx sent to pharmacy   

## 2014-07-20 NOTE — Telephone Encounter (Signed)
lmomtcb x1 

## 2014-07-20 NOTE — Telephone Encounter (Signed)
Lvom with pharmacy advising to dispense 5 boxes of insulin as rx was originally sent with this quantity.

## 2014-07-20 NOTE — Telephone Encounter (Signed)
Patient states she is currently out of her insulin  Insulin: Lantus [place]: [place] [phone]  Please send new Rx   Thank You

## 2014-07-20 NOTE — Telephone Encounter (Signed)
Patient states she would like 5 boxes of insulin not 2 boxes  Per patient when she receives more she gets assistance    Please advise    Thank you

## 2014-07-23 ENCOUNTER — Other Ambulatory Visit: Payer: Self-pay | Admitting: Internal Medicine

## 2014-07-23 NOTE — Telephone Encounter (Signed)
lmtcb X1 for pt  

## 2014-07-24 ENCOUNTER — Ambulatory Visit: Payer: Medicare Other | Attending: Internal Medicine | Admitting: Speech Pathology

## 2014-07-24 ENCOUNTER — Encounter: Payer: Self-pay | Admitting: Speech Pathology

## 2014-07-24 DIAGNOSIS — J383 Other diseases of vocal cords: Secondary | ICD-10-CM | POA: Diagnosis not present

## 2014-07-24 NOTE — Patient Instructions (Signed)
  ABDOMINAL BREATHING  . Place your hand on your belly . Breathe in through your nose and fill your belly with air, watching your hand move outward . Breathe out through your mouth and watch your belly move in. An audible "sh" may help   Think of your belly as a balloon, when you fill with air, the balloon gets bigger. As the air goes out, the balloon deflates.  If you are having difficulty coordinating this, lay on your back with a plastic cup on your belly and repeat the above steps, watching you belly move up with inhalation and down with exhalations  Practice breathing in and out in front of a mirror, watching your belly Breathe in for a count of 5 and breathe out for a count of 5  Now as you breathe out, get a picture of relaxing in your mind Feel the constant in-out of your breathing with your belly Picture the tension in your throat and chest evaporate like steam, melting away and FEEL it do so Picture your throat opening up so wide that a grapefruit or softball could fit through your throat.  There's an App for that: Breathe2relax  Provided by: Mickel Baas L. [address] [phone]

## 2014-07-24 NOTE — Therapy (Signed)
[facility] [address] [place], [place], [address] Phone: [phone]   Fax:  [phone]  Speech Language Pathology Evaluation  Patient Details  Name: Christy May MRN: [id number] Date of Birth: Aug 29, 1950 Referring Provider:  Biagio Borg, MD  Encounter Date: 07/24/2014      End of Session - 07/24/14 1459    SLP Start Time 27   SLP Stop Time  1400   SLP Time Calculation (min) 35 min      Past Medical History  Diagnosis Date  . Anemia, unspecified   . Extrinsic asthma, unspecified   . Chronic airway obstruction, not elsewhere classified   . Spondylosis of unspecified site without mention of myelopathy   . Dehydration   . Depressive disorder, not elsewhere classified   . Type II or unspecified type diabetes mellitus without mention of complication, not stated as uncontrolled   . Esophageal reflux   . Unspecified essential hypertension   . Unspecified menopausal and postmenopausal disorder   . Obesity, unspecified   . Inflammatory and toxic neuropathy, unspecified   . Unspecified sleep apnea   . Unspecified vitamin D deficiency   . Dysphagia 2007    historyof dysphagia with severe dysmotility by barium swallow-[name]  . Obesity   . Hypertension   . Diabetes mellitus without complication   . Allergic bronchitis   . Sleep apnea   . Gout 01/04/2013    Past Surgical History  Procedure Laterality Date  . Laser sugery      bilateral eyes  . Back surgery      fusion-multiple cervical spine levels-Dr. Arnoldo Morale  . Excision of abcess      ?? Chest  . Dilation and curettage of uterus    . Exploratory laparotomy    . Partial hysterectomy    . Total knee arthroplasty  2005    Left  . Total knee arthroplasty  2005    Right- [name]  . Foot surgery      Right X 2  . Cholecystectomy    . Appendectomy    . Tonsillectomy    . Abdominal hysterectomy    . Esophagogastroduodenoscopy N/A  05/08/2014    Procedure: ESOPHAGOGASTRODUODENOSCOPY (EGD);  Surgeon: [name], MD;  Location: [name];  Service: Endoscopy;  Laterality: N/A;  . Colonoscopy N/A 05/08/2014    Procedure: COLONOSCOPY;  Surgeon: [name], MD;  Location: [facility];  Service: Endoscopy;  Laterality: N/A;    There were no vitals taken for this visit.  Visit Diagnosis: Vocal cord dysfunction - Plan: SLP plan of care cert/re-cert      Subjective Assessment - 07/24/14 1335    Currently in Pain? Yes   Pain Score 8    Pain Location Other (Comment)  "Chronic body pain"   Pain Descriptors / Indicators Aching;Constant   Pain Type Chronic pain   Pain Onset More than a month ago   Pain Frequency Constant   Pain Relieving Factors pain meds, pain shots from MD          SLP Evaluation [facility] - 07/24/14 1335    SLP Visit Information   SLP Received On 07/24/14   Onset Date 8 months ago   Medical Diagnosis cough   Subjective   Subjective I cough every 1/2 hour, [facility] told Dr. Jenny Reichmann they didn't find any asthma or allergies"   Patient/Family Stated Goal Improve coughing   General Information   HPI Pt wil h/o severe coughing  throughout the day. She reports this has improved. She is not gabapentin 3x a day, nexium am and prevacid before bed.  Cold initiates coughing. She reports strong scents induce coughing.  Allergies, asthma have been r/o at [facility].  She has been on cough medicine for years. Pt with h/o severe acid reflux. No coughing during this evaluation however voice is mildly hoarse with glottal fry.   Behavioral/Cognition walks independently with much SOB   Prior Functional Status   Type of Home House    Lives With Alone   Vocation On disability   Motor Speech   Respiration Impaired   Level of Impairment Sentence   Phonation Hoarse   Motor Speech Errors Not applicable   Phonation Impaired   Vocal Abuses Habitual Cough/Throat Clear   Tension Present Neck;Shoulder   Volume  Appropriate   Pitch Appropriate           ADULT SLP TREATMENT - 07/24/14 1516    Cognitive-Linquistic Treatment   Skilled Treatment Pt instructed in VCD triggers, laryngeal spasm, and trained in abdominal breathing. Pt required frequent mod A to comlete abdominal breathing. Pt instructed to practice abdominal breathing 2x a day at home.          SLP Education - 07/24/14 1354    Education provided Yes   Education Details VCD, abdmonimal breathing, reflux precautions   Person(s) Educated Patient   Methods Explanation;Demonstration;Handout          SLP Short Term Goals - 07/24/14 1504    SLP SHORT TERM GOAL #1   Title Pt will converse for 52minutes using abdominal breathing with occassional min A. (due 08/23/14)   Time 4   Period Weeks   Status New   SLP SHORT TERM GOAL #2   Title Pt will reports successful achievement of relaxation techniques outside of therapy over 3 sessions (due 08/23/14)   Time 4   Period Weeks   Status New   SLP SHORT TERM GOAL #3   Title Pt will report decreased frequency of VCD symptoms outside of therapy by 25% subjectively (due 08/23/14)   Time 4   Period Weeks   Status New          SLP Long Term Goals - 07/24/14 1510    SLP LONG TERM GOAL #1   Title Pt will demonstrate compensatory strategies  for relaxation outside of therapy over 6 sessions (due 09/18/14)   Time 8   Period Weeks   Status New   SLP LONG TERM GOAL #2   Title Pt will converse for 10 minutes with abdominal breathing with rare min A (due 09/18/14)   Time 8   Period Weeks   Status New   SLP LONG TERM GOAL #3   Title Pt will report decreased frequency of VCD symptoms outside of therapy by 50% subjectively (due 09/18/14)   Time 8   Period Weeks   Status New          Plan - 07/24/14 1500    Clinical Impression Statement Pt presents with VCD symptoms. Cough is improved at this time. Pt reports scents, GERD and cold temperatures initiate coughing . Pt noted to have tension in  neck and shoulders. Pt quite SOB while walking back to therapy room. She requires breaks to stop and catch her breI recommend skilled St to reduce frequency and duratoin of VCD episodes.ath.    Speech Therapy Frequency 2x / week   Duration Other (comment)  for 8 weeks  Treatment/Interventions Internal/external aids;SLP instruction and feedback;Compensatory techniques;Environmental controls;Patient/family education   Potential to Achieve Goals Good   Consulted and Agree with Plan of Care Patient          G-Codes - 26-Jul-2014 1514    Functional Assessment Tool Used NOMS   Functional Limitations Voice   Voice Current Status (G9171) At least 20 percent but less than 40 percent impaired, limited or restricted   Voice Goal Status (G9172) At least 1 percent but less than 20 percent impaired, limited or restricted      Problem List Patient Active Problem List   Diagnosis Date Noted  . Acute upper respiratory infection 05/30/2014  . Acute esophagitis 05/08/2014  . Benign neoplasm of sigmoid colon 05/08/2014  . Change in bowel habits 04/17/2014  . Fecal incontinence 04/17/2014  . Urge incontinence 04/08/2014  . Cough 12/06/2013  . Chronic pain syndrome 08/24/2013  . Orthostasis 07/18/2013  . Acute kidney injury 07/18/2013  . Bilateral carpal tunnel syndrome 02/14/2013  . Gout 01/04/2013  . Failure to thrive 06/28/2012  . Gait disorder 06/28/2012  . Type 1 diabetes mellitus with neurological manifestations, uncontrolled 08/30/2011  . Preventative health care 08/23/2011  . Cervical radiculopathy 02/11/2011  . Low back pain 02/11/2011  . VITAMIN D DEFICIENCY 11/22/2009  . ANEMIA-NOS 11/22/2009  . DEGENERATIVE JOINT DISEASE, CERVICAL SPINE 11/22/2009  . Depression 09/16/2009  . POLYNEUROPATHY 09/16/2009  . OBESITY 09/09/2009  . Essential hypertension 09/09/2009  . GERD 09/09/2009  . Sleep apnea 09/09/2009    Chael Urenda, Annye Rusk, SLP July 26, 2014, 3:20 PM  [facility] [address] [place], [place], [address] Phone: [phone]   Fax:  [phone]

## 2014-07-24 NOTE — Telephone Encounter (Signed)
Pt has upcoming appointment on 08/09/14. Will address issues with her then. She has been reminded of her appointment.

## 2014-07-25 ENCOUNTER — Ambulatory Visit: Payer: Medicare Other

## 2014-07-25 ENCOUNTER — Ambulatory Visit (INDEPENDENT_AMBULATORY_CARE_PROVIDER_SITE_OTHER): Payer: Medicare Other | Admitting: Endocrinology

## 2014-07-25 ENCOUNTER — Encounter: Payer: Self-pay | Admitting: Endocrinology

## 2014-07-25 VITALS — BP 130/88 | HR 101 | Temp 97.9°F | Ht 64.0 in | Wt 330.0 lb

## 2014-07-25 DIAGNOSIS — E1041 Type 1 diabetes mellitus with diabetic mononeuropathy: Secondary | ICD-10-CM | POA: Diagnosis not present

## 2014-07-25 DIAGNOSIS — E1065 Type 1 diabetes mellitus with hyperglycemia: Principal | ICD-10-CM

## 2014-07-25 DIAGNOSIS — IMO0002 Reserved for concepts with insufficient information to code with codable children: Secondary | ICD-10-CM

## 2014-07-25 DIAGNOSIS — E1049 Type 1 diabetes mellitus with other diabetic neurological complication: Secondary | ICD-10-CM

## 2014-07-25 LAB — TSH: TSH: 3.2 u[IU]/mL (ref 0.35–4.50)

## 2014-07-25 MED ORDER — INSULIN GLARGINE 100 UNIT/ML SOLOSTAR PEN: 270.0000 [IU] | PEN_INJECTOR | SUBCUTANEOUS | Status: DC

## 2014-07-25 MED ORDER — INSULIN LISPRO 100 UNIT/ML (KWIKPEN): 30.0000 [IU] | PEN_INJECTOR | Freq: Every day | SUBCUTANEOUS | Status: DC

## 2014-07-25 NOTE — Progress Notes (Signed)
Subjective:    Patient ID: Christy May, female    DOB: [birth year], 65 y.o.   MRN: [id number]  HPI Pt returns for f/u of diabetes mellitus: DM type: Insulin-requiring type 2 Dx'ed: 3532 Complications: none Therapy: insulin since 2002 GDM: never DKA: never Severe hypoglycemia: never Pancreatitis: never Other: she was changed to a simpler qd insulin regimen, after poor results on multiple daily injections; she wants the cheapest possible insulin, but she did poorly on NPH;  Interval history: She is still pursuing weight loss surgery.  she brings a record of her cbg's which i have reviewed today.  It varies from 56-327.  There is no trend throughout the day.   Past Medical History  Diagnosis Date  . Anemia, unspecified   . Extrinsic asthma, unspecified   . Chronic airway obstruction, not elsewhere classified   . Spondylosis of unspecified site without mention of myelopathy   . Dehydration   . Depressive disorder, not elsewhere classified   . Type II or unspecified type diabetes mellitus without mention of complication, not stated as uncontrolled   . Esophageal reflux   . Unspecified essential hypertension   . Unspecified menopausal and postmenopausal disorder   . Obesity, unspecified   . Inflammatory and toxic neuropathy, unspecified   . Unspecified sleep apnea   . Unspecified vitamin D deficiency   . Dysphagia 2007    historyof dysphagia with severe dysmotility by barium swallow-[name]  . Obesity   . Hypertension   . Diabetes mellitus without complication   . Allergic bronchitis   . Sleep apnea   . Gout 01/04/2013    Past Surgical History  Procedure Laterality Date  . Laser sugery      bilateral eyes  . Back surgery      fusion-multiple cervical spine levels-Dr. Arnoldo Morale  . Excision of abcess      ?? Chest  . Dilation and curettage of uterus    . Exploratory laparotomy    . Partial hysterectomy    . Total knee arthroplasty  2005    Left  . Total knee  arthroplasty  2005    Right- [name]  . Foot surgery      Right X 2  . Cholecystectomy    . Appendectomy    . Tonsillectomy    . Abdominal hysterectomy    . Esophagogastroduodenoscopy N/A 05/08/2014    Procedure: ESOPHAGOGASTRODUODENOSCOPY (EGD);  Surgeon: [name], MD;  Location: [name];  Service: Endoscopy;  Laterality: N/A;  . Colonoscopy N/A 05/08/2014    Procedure: COLONOSCOPY;  Surgeon: [name], MD;  Location: [facility];  Service: Endoscopy;  Laterality: N/A;    History   Social History  . Marital Status: Widowed    Spouse Name: N/A    Number of Children: N/A  . Years of Education: N/A   Occupational History  . disabled    Social History Main Topics  . Smoking status: Former Smoker    Quit date: 06/22/1986  . Smokeless tobacco: Never Used  . Alcohol Use: No  . Drug Use: No  . Sexual Activity: Not on file   Other Topics Concern  . Not on file   Social History Narrative   ** Merged History Encounter **       Patient does not get regular exercise.      Widowed 2004      Disabled    Current Outpatient Prescriptions on File Prior to Visit  Medication Sig Dispense Refill  .  albuterol (PROVENTIL) (2.5 MG/3ML) 0.083% nebulizer solution Take 2.5 mg by nebulization every 6 (six) hours as needed for wheezing or shortness of breath.    Marland Kitchen aspirin 81 MG tablet Take 81 mg by mouth at bedtime.     Marland Kitchen azithromycin (ZITHROMAX Z-PAK) 250 MG tablet Use as directed 6 tablet 1  . cholecalciferol (VITAMIN D) 1000 UNITS tablet Take 2,000 Units by mouth every morning.     . cyclobenzaprine (FLEXERIL) 5 MG tablet TAKE ONE TABLET BY MOUTH THREE TIMES DAILY AS NEEDED FOR MUSCLE SPASMS 90 tablet 0  . dextromethorphan (DELSYM) 30 MG/5ML liquid Take 30 mg by mouth 3 (three) times daily as needed for cough.    . diclofenac sodium (VOLTAREN) 1 % GEL Apply 4 g topically 4 (four) times daily as needed (right hand, arm and neck pain.).    Marland Kitchen esomeprazole (NEXIUM) 40 MG  capsule Take 1 capsule (40 mg total) by mouth 2 (two) times daily. 60 capsule 3  . famotidine (PEPCID) 20 MG tablet Take 1 tablet (20 mg total) by mouth at bedtime. 30 tablet 3  . fluticasone (FLONASE) 50 MCG/ACT nasal spray Place 2 sprays into both nostrils every morning. 16 g 6  . gabapentin (NEURONTIN) 800 MG tablet Take 800 mg by mouth 3 (three) times daily.    Marland Kitchen gabapentin (NEURONTIN) 800 MG tablet TAKE 1 TABLET BY MOUTH THREE TIMES DAILY 270 tablet 1  . glucose blood (ONE TOUCH ULTRA TEST) test strip Use to test blood sugars twice a day. Dx 250.63 100 each 12  . HYDROcodone-homatropine (HYCODAN) 5-1.5 MG/5ML syrup Take 5 mLs by mouth every 6 (six) hours as needed for cough. 480 mL 0  . irbesartan (AVAPRO) 300 MG tablet Take 300 mg by mouth every morning.     . Lancets (ONETOUCH ULTRASOFT) lancets Use to test blood sugars twice a day. 100 each 12  . loratadine (CLARITIN) 10 MG tablet Take 10 mg by mouth every morning.    . metoprolol (LOPRESSOR) 50 MG tablet Take 75 mg by mouth 2 (two) times daily.    . montelukast (SINGULAIR) 10 MG tablet Take 10 mg by mouth at bedtime.    Marland Kitchen nystatin cream (MYCOSTATIN) Apply to affected area 2 times daily 30 g 0  . oxybutynin (DITROPAN-XL) 10 MG 24 hr tablet Take 10 mg by mouth every morning.      No current facility-administered medications on file prior to visit.    Allergies  Allergen Reactions  . Ace Inhibitors Anaphylaxis  . Penicillins Hives and Rash  . Tape Hives    Paper tape is ok to use  . Adhesive [Tape] Rash    Family History  Problem Relation Age of Onset  . Esophageal cancer Brother   . Esophageal cancer Sister     ?  . Colon polyps Brother   . Pancreatic cancer Sister     ?  . Diabetes Sister   . Diabetes      Aunt and Uncle  . Heart disease Maternal Grandfather     BP 130/88 mmHg  Pulse 101  Temp(Src) 97.9 F (36.6 C) (Oral)  Ht 5\' 4"  (1.626 m)  Wt 330 lb (149.687 kg)  BMI 56.62 kg/m2  SpO2 99%  Review of  Systems Denies LOC and weight change    Objective:   Physical Exam VITAL SIGNS:  See vs page GENERAL: no distress Pulses: dorsalis pedis intact bilat.  Feet: no deformity. feet are of normal color and temp. 1+ bilat  leg edema. There is bilateral onychomycosis.   Skin: no ulcer on the feet. Skin on the feet is very dry.   Neuro: sensation is intact to touch onthe feet, but decreased from normal.    Lab Results  Component Value Date   HGBA1C 8.8* 05/23/2014       Assessment & Plan:  DM: moderate exacerbation.  We discussed, and pt agrees to take 2 injections per day.  Side-effect of rx: mild hypoglycemia.  Morbid obesity: persistent.   Patient is advised the following: Patient Instructions  Please come back for a follow-up appointment in 3 months.   check your blood sugar twice a day.  vary the time of day when you check, between before the 3 meals, and at bedtime.  also check if you have symptoms of your blood sugar being too high or too low.  please keep a record of the readings and bring it to your next appointment here.  please call us sooner if your blood sugar goes below 70, or if you have a lot of readings over 200.   blood tests are being requested for you today.  We'll let you know about the results. Please reduce the lantus to 270 units daily, and: Add humalog, 30 units with the evening meal.   On this type of insulin schedule, you should eat meals on a regular schedule.  If a meal is missed or significantly delayed, your blood sugar could go low.   Please continue to pursue the weight loss surgery.

## 2014-07-25 NOTE — Patient Instructions (Addendum)
Please come back for a follow-up appointment in 3 months.   check your blood sugar twice a day.  vary the time of day when you check, between before the 3 meals, and at bedtime.  also check if you have symptoms of your blood sugar being too high or too low.  please keep a record of the readings and bring it to your next appointment here.  please call us sooner if your blood sugar goes below 70, or if you have a lot of readings over 200.   blood tests are being requested for you today.  We'll let you know about the results. Please reduce the lantus to 270 units daily, and: Add humalog, 30 units with the evening meal.   On this type of insulin schedule, you should eat meals on a regular schedule.  If a meal is missed or significantly delayed, your blood sugar could go low.   Please continue to pursue the weight loss surgery.

## 2014-07-26 ENCOUNTER — Ambulatory Visit: Payer: Medicare Other | Admitting: Podiatry

## 2014-07-26 ENCOUNTER — Other Ambulatory Visit: Payer: Self-pay | Admitting: Emergency Medicine

## 2014-07-26 ENCOUNTER — Other Ambulatory Visit: Payer: Self-pay | Admitting: Internal Medicine

## 2014-07-26 LAB — MICROALBUMIN / CREATININE URINE RATIO
CREATININE, U: 111.9 mg/dL
MICROALB/CREAT RATIO: 12.3 mg/g (ref 0.0–30.0)
Microalb, Ur: 13.8 mg/dL — ABNORMAL HIGH (ref 0.0–1.9)

## 2014-07-27 ENCOUNTER — Ambulatory Visit: Payer: Medicare Other

## 2014-07-27 DIAGNOSIS — G5601 Carpal tunnel syndrome, right upper limb: Secondary | ICD-10-CM | POA: Diagnosis not present

## 2014-07-27 DIAGNOSIS — M1811 Unilateral primary osteoarthritis of first carpometacarpal joint, right hand: Secondary | ICD-10-CM | POA: Diagnosis not present

## 2014-07-27 DIAGNOSIS — M1812 Unilateral primary osteoarthritis of first carpometacarpal joint, left hand: Secondary | ICD-10-CM | POA: Diagnosis not present

## 2014-07-27 DIAGNOSIS — G5602 Carpal tunnel syndrome, left upper limb: Secondary | ICD-10-CM | POA: Diagnosis not present

## 2014-07-31 ENCOUNTER — Ambulatory Visit: Payer: Medicare Other | Admitting: Speech Pathology

## 2014-07-31 ENCOUNTER — Telehealth: Payer: Self-pay | Admitting: Endocrinology

## 2014-07-31 NOTE — Telephone Encounter (Signed)
Can she go on a daniel's fast with her church. No meat, bread, processed foods.

## 2014-08-01 ENCOUNTER — Encounter: Payer: Medicare Other | Attending: Internal Medicine | Admitting: *Deleted

## 2014-08-01 DIAGNOSIS — E1041 Type 1 diabetes mellitus with diabetic mononeuropathy: Secondary | ICD-10-CM | POA: Insufficient documentation

## 2014-08-01 DIAGNOSIS — E1049 Type 1 diabetes mellitus with other diabetic neurological complication: Secondary | ICD-10-CM

## 2014-08-01 DIAGNOSIS — Z6841 Body Mass Index (BMI) 40.0 and over, adult: Secondary | ICD-10-CM | POA: Diagnosis not present

## 2014-08-01 DIAGNOSIS — Z713 Dietary counseling and surveillance: Secondary | ICD-10-CM | POA: Insufficient documentation

## 2014-08-01 DIAGNOSIS — IMO0002 Reserved for concepts with insufficient information to code with codable children: Secondary | ICD-10-CM

## 2014-08-01 DIAGNOSIS — E1065 Type 1 diabetes mellitus with hyperglycemia: Secondary | ICD-10-CM

## 2014-08-01 DIAGNOSIS — Z794 Long term (current) use of insulin: Secondary | ICD-10-CM | POA: Diagnosis not present

## 2014-08-01 NOTE — Telephone Encounter (Signed)
See below, Thanks!  

## 2014-08-01 NOTE — Progress Notes (Signed)
  Medical Nutrition Therapy:  Appt start time: 150 end time:  1696.  Assessment:  Primary concerns today: 2/10/165. She has uncontrolled DM2 and morbid obesity with BMI now down to 56.7. She plans to see me monthly for Supervised Weight Loss visits as she considers Bariatric Surgery. She is following the Ileene Musa with her church during [place] for 40 days and has been in touch with Dr. Cordelia Pen office and Leonia Reader, RN, CDE for instructions on adjusting her insulin doses. She has decided to work with me for weight loss and not alternate with the Bariatric Nutritionist. She is coughing excessively today.   Preferred Learning Style:   No preference indicated   Learning Readiness:   Ready  Change in progress  MEDICATIONS: see list. Diabetes medication is Lantus @ 270 units every evening   DIETARY INTAKE:  24-hr recall:  B ( AM): cooked vegetables in small amount of olive oil while on Daniel Fast Snk ( AM):  L ( PM): she is not sure what foods she will be eating for lunch and supper yet  Snk ( PM):  D ( PM):  Snk ( PM):  Beverages: water  Usual physical activity: activities of daily living, walks short distances with walker that has a seat. She did not bring her walker to today's visit.   Estimated energy needs: 1600 calories 180 g carbohydrates 120 g protein 44 g fat  Progress Towards Goal(s):  In progress.   Nutritional Diagnosis:  NI-1.5 Excessive energy intake As related to activity level.  As evidenced by BMI of 57.5, now down to 56.7.    Intervention:  Nutrition counseling and diabetes education continued. Discussed Carb Counting as method of portion control especially while on the [employer]. She is aiming for 3 Carb Choices per meal and 0-1 for any snacks.  Encouraged her to continue SMBG daily and to notify her MD for insulin dose adjustment if BG in 90's or below for 3 days in a row to decrease risk for hypoglycemia. .  Plan:  Aim for 3 Carb Choices per meal  (45 grams) +/- 1 either way  Aim for 0-1 Carbs per snack if hungry  Include protein in moderation with your meals and snacks Consider reading food labels for Total Carbohydrate of foods Consider checking BG at alternate times per day as directed by MD  Notifiy your MD if you have 2-3 days of BG's in the 90's or below so he can tell you how much to cut back on your insulin dose Get some Glucose Tablets to carry in your purse`  Teaching Method Utilized: Visual, Auditory and Hands on  Handouts given during visit include:  No new handouts today  Barriers to learning/adherence to lifestyle change: morbid obesity and it's affect on daily living  Demonstrated degree of understanding via:  Teach Back   Monitoring/Evaluation:  Dietary intake, exercise, reading food labels, and body weight in 1 month(s).

## 2014-08-01 NOTE — Telephone Encounter (Signed)
i agree. 

## 2014-08-01 NOTE — Telephone Encounter (Signed)
Patient reports that this diet has no white flour, and no animal proteins.  What she can eat are all vegetables, fruit juice and fruits, lentils and all beans, all nuts, whole wheat bread and brown rice and other whole grain carbohydrates.  She is taking 270u of Lantus, and 30u of Humalog acS.   Discussed the importance of eating protein at every meal--like beans and nuts, but stressed need to monitor blood sugars more closely.  She was told that if she drops low after supper, she can reduce the dose of Humalog by 5u.  Discouraged her from drinking fruit juice, (unless blood sugar is low), and told her that if her weight starts dropping, she may have low blood sugars during the day, and will need to carry something with her to treat it with, at all times.   Suggested that she call here if blood sugars drop at other times during the day, so that we can reduce the lantus dose.   She agreed to do this.

## 2014-08-01 NOTE — Patient Instructions (Signed)
Plan:  Aim for 3 Carb Choices per meal (45 grams) +/- 1 either way within the guidelines of the [employer] Fast you are currently following  Aim for 0-1 Carbs per snack if hungry  Include protein in moderation with your meals and snacks Consider reading food labels for Total Carbohydrate of foods Continue checking BG at alternate times per day as directed by MD  Notifiy your MD if you have 2-3 days of BG's in the 90's or below so he can tell you how much to cut back on your insulin dose Get some Glucose Tablets to carry in your purse

## 2014-08-02 ENCOUNTER — Ambulatory Visit: Payer: Medicare Other | Admitting: Speech Pathology

## 2014-08-02 DIAGNOSIS — J383 Other diseases of vocal cords: Secondary | ICD-10-CM

## 2014-08-02 NOTE — Patient Instructions (Addendum)
  At least twice a day for 10 minutes:  Practice abdominal breathing laying flat with empty cup on belly - watch cup go up when you breathe in and go down when you breath out.  Practice prolonging exhalation for 5 seconds "SH"  After initial breathing practice, add relaxation to your exhalation - Visualize yourself at the beach, very relaxed and letting go of  all tension in your neck and throat.   Practice using this breathing during your talking/converation also to reduce tension on your throat.   ABDOMINAL BREATHING  . Place your hand on your belly . Breathe in through your nose and fill your belly with air, watching your hand move outward . Breathe out through your mouth and watch your belly move in. An audible "sh" may help   Think of your belly as a balloon, when you fill with air, the balloon gets bigger. As the air goes out, the balloon deflates.  If you are having difficulty coordinating this, lay on your back with a plastic cup on your belly and repeat the above steps, watching you belly move up with inhalation and down with exhalations  Practice breathing in and out in front of a mirror, watching your belly Breathe in for a count of 5 and breathe out for a count of 5  Now as you breathe out, get a picture of relaxing in your mind Feel the constant in-out of your breathing with your belly Picture the tension in your throat and chest evaporate like steam, melting away and FEEL it do so Picture your throat opening up so wide that a grapefruit or softball could fit through your throat.  There's an App for that: Breathe2relax  Provided by: Mickel Baas L. ST [id number]

## 2014-08-02 NOTE — Therapy (Signed)
[facility] [address] [place], [place], [address] Phone: [phone]   Fax:  [phone]  Speech Language Pathology Treatment  Patient Details  Name: Christy May MRN: [id number] Date of Birth: 01/10/1951 Referring Provider:  Biagio Borg, MD  Encounter Date: 08/02/2014      End of Session - 08/02/14 1023    Visit Number 2   Number of Visits 16   Date for SLP Re-Evaluation 09/18/14   SLP Start Time 0931   SLP Stop Time  1017   SLP Time Calculation (min) 46 min      Past Medical History  Diagnosis Date  . Anemia, unspecified   . Extrinsic asthma, unspecified   . Chronic airway obstruction, not elsewhere classified   . Spondylosis of unspecified site without mention of myelopathy   . Dehydration   . Depressive disorder, not elsewhere classified   . Type II or unspecified type diabetes mellitus without mention of complication, not stated as uncontrolled   . Esophageal reflux   . Unspecified essential hypertension   . Unspecified menopausal and postmenopausal disorder   . Obesity, unspecified   . Inflammatory and toxic neuropathy, unspecified   . Unspecified sleep apnea   . Unspecified vitamin D deficiency   . Dysphagia 2007    historyof dysphagia with severe dysmotility by barium swallow-[name]  . Obesity   . Hypertension   . Diabetes mellitus without complication   . Allergic bronchitis   . Sleep apnea   . Gout 01/04/2013    Past Surgical History  Procedure Laterality Date  . Laser sugery      bilateral eyes  . Back surgery      fusion-multiple cervical spine levels-Dr. Arnoldo Morale  . Excision of abcess      ?? Chest  . Dilation and curettage of uterus    . Exploratory laparotomy    . Partial hysterectomy    . Total knee arthroplasty  2005    Left  . Total knee arthroplasty  2005    Right- [name]  . Foot surgery      Right X 2  . Cholecystectomy    . Appendectomy    .  Tonsillectomy    . Abdominal hysterectomy    . Esophagogastroduodenoscopy N/A 05/08/2014    Procedure: ESOPHAGOGASTRODUODENOSCOPY (EGD);  Surgeon: [name], MD;  Location: [name];  Service: Endoscopy;  Laterality: N/A;  . Colonoscopy N/A 05/08/2014    Procedure: COLONOSCOPY;  Surgeon: [name], MD;  Location: [facility];  Service: Endoscopy;  Laterality: N/A;    There were no vitals taken for this visit.  Visit Diagnosis: Vocal cord dysfunction      Subjective Assessment - 08/02/14 0939    Symptoms "I thought my appointment on Tues was at 2:00"             ADULT SLP TREATMENT - 08/02/14 0940    General Information   Behavior/Cognition Alert;Cooperative;Pleasant mood   Treatment Provided   Treatment provided Cognitive-Linquistic   Pain Assessment   Pain Assessment 0-10   Pain Score 7    Pain Location right shoulder, arm   Pain Descriptors / Indicators Aching   Cognitive-Linquistic Treatment   Treatment focused on Voice   Skilled Treatment Pt reports couging episode at dietian office while she was eating almond butter.  Abdoonimal  breating in isolation with usual min to mod A. Relaxation initatiated. Pt reports her relaxation vision in [facility]. Pt utlized  abdominal breathing in simple conversation with usual mod A.    Assessment / Recommendations / Plan   Plan Continue with current plan of care   Progression Toward Goals   Progression toward goals Progressing toward goals          SLP Education - 08/02/14 1022    Education provided Yes   Education Details abdoonimal breathing, relaxation   Person(s) Educated Patient   Methods Explanation;Demonstration;Handout          SLP Short Term Goals - 08/02/14 1026    SLP SHORT TERM GOAL #1   Title Pt will converse for 57minutes using abdominal breathing with occassional min A. (due 08/23/14)   Time 4   Period Weeks   Status New   SLP SHORT TERM GOAL #2   Title Pt will reports successful  achievement of relaxation techniques outside of therapy over 3 sessions (due 08/23/14)   Time 4   Period Weeks   Status New   SLP SHORT TERM GOAL #3   Title Pt will report decreased frequency of VCD symptoms outside of therapy by 25% subjectively (due 08/23/14)   Time 4   Period Weeks   Status New          SLP Long Term Goals - 08/02/14 1026    SLP LONG TERM GOAL #1   Title Pt will demonstrate compensatory strategies  for relaxation outside of therapy over 6 sessions (due 09/18/14)   Time 8   Period Weeks   Status New   SLP LONG TERM GOAL #2   Title Pt will converse for 10 minutes with abdominal breathing with rare min A (due 09/18/14)   Time 8   Period Weeks   Status New   SLP LONG TERM GOAL #3   Title Pt will report decreased frequency of VCD symptoms outside of therapy by 50% subjectively (due 09/18/14)   Time 8   Period Weeks   Status New          Plan - 08/02/14 1023    Clinical Impression Statement Pt requires usual min to mod A for abdominal breathing in isolation & conversation. Initiated relaxation. Continue skilled ST for carryover of abdominal breathing to reduced VCD symptoms.    Speech Therapy Frequency 2x / week   Treatment/Interventions Internal/external aids;SLP instruction and feedback;Compensatory techniques;Environmental controls;Patient/family education   Potential to Achieve Goals Good   Consulted and Agree with Plan of Care Patient        Problem List Patient Active Problem List   Diagnosis Date Noted  . Acute upper respiratory infection 05/30/2014  . Acute esophagitis 05/08/2014  . Benign neoplasm of sigmoid colon 05/08/2014  . Change in bowel habits 04/17/2014  . Fecal incontinence 04/17/2014  . Urge incontinence 04/08/2014  . Cough 12/06/2013  . Chronic pain syndrome 08/24/2013  . Orthostasis 07/18/2013  . Acute kidney injury 07/18/2013  . Bilateral carpal tunnel syndrome 02/14/2013  . Gout 01/04/2013  . Failure to thrive 06/28/2012  .  Gait disorder 06/28/2012  . Type 1 diabetes mellitus with neurological manifestations, uncontrolled 08/30/2011  . Preventative health care 08/23/2011  . Cervical radiculopathy 02/11/2011  . Low back pain 02/11/2011  . VITAMIN D DEFICIENCY 11/22/2009  . ANEMIA-NOS 11/22/2009  . DEGENERATIVE JOINT DISEASE, CERVICAL SPINE 11/22/2009  . Depression 09/16/2009  . POLYNEUROPATHY 09/16/2009  . OBESITY 09/09/2009  . Essential hypertension 09/09/2009  . GERD 09/09/2009  . Sleep apnea 09/09/2009    Matilde Markie, Annye Rusk, SLP 08/02/2014, 10:27 AM  [facility] [address] [place], [place], [address] Phone: [phone]   Fax:  [phone]

## 2014-08-03 ENCOUNTER — Ambulatory Visit: Payer: Medicare Other

## 2014-08-07 ENCOUNTER — Ambulatory Visit: Payer: Medicare Other

## 2014-08-07 DIAGNOSIS — J383 Other diseases of vocal cords: Secondary | ICD-10-CM

## 2014-08-07 NOTE — Therapy (Signed)
[facility] [address] [place], [place], [address] Phone: [phone]   Fax:  [phone]  Speech Language Pathology Treatment  Patient Details  Name: Christy May MRN: [id number] Date of Birth: 08-04-1950 Referring Provider:  Biagio Borg, MD  Encounter Date: 08/07/2014      End of Session - 08/07/14 1613    Visit Number 3   Number of Visits 16   Date for SLP Re-Evaluation 09/18/14   SLP Start Time 1532   SLP Stop Time  1612   SLP Time Calculation (min) 40 min   Activity Tolerance Patient tolerated treatment well      Past Medical History  Diagnosis Date  . Anemia, unspecified   . Extrinsic asthma, unspecified   . Chronic airway obstruction, not elsewhere classified   . Spondylosis of unspecified site without mention of myelopathy   . Dehydration   . Depressive disorder, not elsewhere classified   . Type II or unspecified type diabetes mellitus without mention of complication, not stated as uncontrolled   . Esophageal reflux   . Unspecified essential hypertension   . Unspecified menopausal and postmenopausal disorder   . Obesity, unspecified   . Inflammatory and toxic neuropathy, unspecified   . Unspecified sleep apnea   . Unspecified vitamin D deficiency   . Dysphagia 2007    historyof dysphagia with severe dysmotility by barium swallow-[name]  . Obesity   . Hypertension   . Diabetes mellitus without complication   . Allergic bronchitis   . Sleep apnea   . Gout 01/04/2013    Past Surgical History  Procedure Laterality Date  . Laser sugery      bilateral eyes  . Back surgery      fusion-multiple cervical spine levels-Dr. Arnoldo Morale  . Excision of abcess      ?? Chest  . Dilation and curettage of uterus    . Exploratory laparotomy    . Partial hysterectomy    . Total knee arthroplasty  2005    Left  . Total knee arthroplasty  2005    Right- [name]  . Foot surgery      Right X 2   . Cholecystectomy    . Appendectomy    . Tonsillectomy    . Abdominal hysterectomy    . Esophagogastroduodenoscopy N/A 05/08/2014    Procedure: ESOPHAGOGASTRODUODENOSCOPY (EGD);  Surgeon: [name], MD;  Location: [name];  Service: Endoscopy;  Laterality: N/A;  . Colonoscopy N/A 05/08/2014    Procedure: COLONOSCOPY;  Surgeon: [name], MD;  Location: [facility];  Service: Endoscopy;  Laterality: N/A;    There were no vitals taken for this visit.  Visit Diagnosis: Vocal cord dysfunction      Subjective Assessment - 08/07/14 1605    Symptoms "Have I had this all my life?", "My mother told me I had ashthma when I was a child.", "That sniffing helped."             ADULT SLP TREATMENT - 08/07/14 1542    General Information   Behavior/Cognition Alert;Cooperative;Pleasant mood   Treatment Provided   Treatment provided Cognitive-Linquistic   Pain Assessment   Pain Assessment 0-10   Pain Score 8    Pain Location --  rt shoulder and arm   Pain Descriptors / Indicators Aching   Cognitive-Linquistic Treatment   Treatment focused on Voice   Skilled Treatment Pt has practiced abdominal breathing when watching TV, laying down, and when performing  tasks at a table. Abdominal breathing techniques reviewed and reinforced today. Pt with breath hold at top of breath, SLP counseled pt to have smooth inhale/exhale transition as holding breath may well lead to incr'd tightness/tension and thus s/s vocal cord dysfunction (VCD). SLP shaped pt's breathing pattern into a more smooth, gentle relaxed pattern. Larngeal spasm heard occasionally during a 5 minute abdominal breathing task at rest, and pt began coughing 3.5 minutes into task. SLP cued pt for [facility] image and sniff-relaxed exhale as well as other relaxation strategies. Audible inhalation after this episode, with usual laryngeal spasm. Ocassional min-mod cues for easy,and relaxed breathing with inhale through nose and  exhale through mouth. SLP requested pt practice solely AB for 20 minutes twice a day.   Assessment / Recommendations / Plan   Plan Continue with current plan of care   Progression Toward Goals   Progression toward goals Progressing toward goals            SLP Short Term Goals - 08/07/14 1616    SLP SHORT TERM GOAL #1   Title Pt will converse for 13minutes using abdominal breathing with occassional min A. (due 08/23/14)   Time 3   Period Weeks   Status New   SLP SHORT TERM GOAL #2   Title Pt will reports successful achievement of relaxation techniques outside of therapy over 3 sessions (due 08/23/14)   Time 3   Period Weeks   Status New   SLP SHORT TERM GOAL #3   Title Pt will report decreased frequency of VCD symptoms outside of therapy by 25% subjectively (due 08/23/14)   Time 3   Period Weeks   Status New          SLP Long Term Goals - 08/07/14 1616    SLP LONG TERM GOAL #1   Title Pt will demonstrate compensatory strategies  for relaxation outside of therapy over 6 sessions (due 09/18/14)   Time 7   Period Weeks   Status New   SLP LONG TERM GOAL #2   Title Pt will converse for 10 minutes with abdominal breathing with rare min A (due 09/18/14)   Time 7   Period Weeks   Status New   SLP LONG TERM GOAL #3   Title Pt will report decreased frequency of VCD symptoms outside of therapy by 50% subjectively (due 09/18/14)   Time 7   Period Weeks   Status New          Plan - 08/07/14 1614    Clinical Impression Statement Req'd A for abdominal breathing at rest. SLP encouraged pt to practice abdominal breathing only (not with watching TV or doing table tasks) 20 minutes, x2/day   Speech Therapy Frequency 2x / week   Duration --  7 weeks   Treatment/Interventions Internal/external aids;SLP instruction and feedback;Compensatory techniques;Environmental controls;Patient/family education   Potential to Achieve Goals Good        Problem List Patient Active Problem List    Diagnosis Date Noted  . Acute upper respiratory infection 05/30/2014  . Acute esophagitis 05/08/2014  . Benign neoplasm of sigmoid colon 05/08/2014  . Change in bowel habits 04/17/2014  . Fecal incontinence 04/17/2014  . Urge incontinence 04/08/2014  . Cough 12/06/2013  . Chronic pain syndrome 08/24/2013  . Orthostasis 07/18/2013  . Acute kidney injury 07/18/2013  . Bilateral carpal tunnel syndrome 02/14/2013  . Gout 01/04/2013  . Failure to thrive 06/28/2012  . Gait disorder 06/28/2012  . Type 1 diabetes  mellitus with neurological manifestations, uncontrolled 08/30/2011  . Preventative health care 08/23/2011  . Cervical radiculopathy 02/11/2011  . Low back pain 02/11/2011  . VITAMIN D DEFICIENCY 11/22/2009  . ANEMIA-NOS 11/22/2009  . DEGENERATIVE JOINT DISEASE, CERVICAL SPINE 11/22/2009  . Depression 09/16/2009  . POLYNEUROPATHY 09/16/2009  . OBESITY 09/09/2009  . Essential hypertension 09/09/2009  . GERD 09/09/2009  . Sleep apnea 09/09/2009    [facility], SLP 08/07/2014, 4:17 PM  [facility] [address] [place], [place], [address] Phone: [phone]   Fax:  [phone]

## 2014-08-09 ENCOUNTER — Ambulatory Visit (INDEPENDENT_AMBULATORY_CARE_PROVIDER_SITE_OTHER): Payer: Medicare Other | Admitting: Emergency Medicine

## 2014-08-09 ENCOUNTER — Encounter: Payer: Self-pay | Admitting: Emergency Medicine

## 2014-08-09 VITALS — BP 132/70 | HR 85 | Ht 64.0 in | Wt 321.0 lb

## 2014-08-09 DIAGNOSIS — J209 Acute bronchitis, unspecified: Secondary | ICD-10-CM | POA: Diagnosis not present

## 2014-08-09 DIAGNOSIS — R05 Cough: Secondary | ICD-10-CM

## 2014-08-09 DIAGNOSIS — R059 Cough, unspecified: Secondary | ICD-10-CM

## 2014-08-09 DIAGNOSIS — G4730 Sleep apnea, unspecified: Secondary | ICD-10-CM | POA: Diagnosis not present

## 2014-08-09 MED ORDER — HYDROCODONE-HOMATROPINE 5-1.5 MG/5ML PO SYRP: 5.0000 mL | ORAL_SOLUTION | Freq: Four times a day (QID) | ORAL | Status: DC | PRN

## 2014-08-09 MED ORDER — AZITHROMYCIN 250 MG PO TABS: ORAL_TABLET | ORAL | Status: DC

## 2014-08-09 NOTE — Assessment & Plan Note (Signed)
Untreated. We will repeat her split-night sleep study

## 2014-08-09 NOTE — Patient Instructions (Signed)
Please continue all of your current medications for now. We will treat you with azithromycin and Hycodan for suspected bronchitis After you have improved from your bronchitis please try stopping your loratadine (Claritin) and fluticasone nasal spray. If your cough remains stable then you can stay off these medications We will perform a sleep study Continue your speech therapy at [facility] Follow with Dr Lamonte Sakai in 2 months or sooner if you have any problems.

## 2014-08-09 NOTE — Assessment & Plan Note (Signed)
With associated vocal cord dysfunction. She has seen speech therapy at [facility] and is currently on a GERD regimen, a rhinitis regimen and Neurontin. We will try decreasing her rhinitis regimen as she does not describe significant congestion.

## 2014-08-09 NOTE — Progress Notes (Signed)
Subjective:    Patient ID: Christy May, female    DOB: 02/23/1951, 64 y.o.   MRN: [id number]  HPI 64 yo woman, former smoker with obesity, OSA, HTN on irbesartan, DM, childhood asthma. Carries a dx of adult asthma made several years ago. Referred for cough. She describes cough that has been prod of white phlegm, started after URI sx about 4 months ago. Describes a tickle in her throat. Can result in stress incontinence.   She has been on Advair in the past. Was recently changed to QVAR + albuterol, uses about 2-3 x a month. She is on omeprazole 20mg  twice a day. She still has occasional reflux sx > can reflux all the way up to throat. She has allergy and rhinitis sx frequently - singulair has been added recently and has helped her.   CXR 12/22/13 - normal.  We will perform full pulmonary function testing  We will change your omeprazole to nexium 40mg  twice a day until our next visit Continue your singulair Stop QVAR for now Keep albuterol available to use 2 puffs if needed for shortness of breath Start loratadine (Claritin) 10mg  daily Start fluticasone nasal spray, 2 sprays each nostril daily Follow with Dr Lamonte Sakai in 1 month   Acute OV 02/01/14 --  Returns for persistent cough. Seen 1 month ago. Seen last month for pulmonary consult for cough.  She is quite upset that her cough is no better and she can not go anywhere due to stress incontinence w/ coughing fits. Has been going on for ~5 months  On Lopressor 75mg  Twice daily   CXR w/ no acute process 12/22/13 .  Former smoker, PFT pending.  Last ov was changed from prilosec to nexium for better GERD control. She continues to have intermittent reflux.  QVAR was held due to no improvement in symptoms. Previous on Advair in past without help.  No fever, orthopnea, edema , n/v/d, discolored mucus. Does have some sinus drainage.  Has used tussionex with some help, but is out of this.   ROV 02/14/14 -- follows for cough, GERD, PND, suspected  asthma. I changed omeprazole to nexium, added loratdine, flonase. TP added pepcid, chlorpheniramine. Her cough has gotten better but has not gone away completely. She is still having. She has been dx w sleep apnea - she has not been able to tolerate CPAP.   PFT done today 02/14/14 > shows some spiro evidence for restriction but for the most part normal. Could not do volumes.    ROV 03/13/14 -- follow up for chronic cough, influenced by GERD and rhinitis. Her PFT are reassuring > little evidence for asthma. Treated recently by Dr Jenny Reichmann for a possible bronchitis and flare. Temporarily improved but then cough right back to where she started.  She is on high dose nexium, flonase, singulair, pepcid qhs.  She is on loratadine qd, not on chlorpheniramine. She is having some nasal congestion. She still has some mild GERD sx.   ROV 08/09/14 -- follow-up visit for chronic cough. She has a history of GERD and allergic rhinitis. We have not been able to establish a diagnosis of asthma based on her spirometry.She had been feeling well, fairly good cough control. Has been going to the speech therapists at [facility] with benefit. She is on loratadine, singulair, pepcid, nexium, neurontin, fluticasone, loratadine. She asks today about OSA and CPAP.    Review of Systems     Objective:   Physical Exam Filed Vitals:   08/09/14 1439  BP: 132/70  Pulse: 85  Height: 5\' 4"  (1.626 m)  Weight: 321 lb (145.605 kg)  SpO2: 97%    GEN: A/Ox3; pleasant , NAD, obese   HEENT:  [place]/AT,  EACs-clear, TMs-wnl, NOSE-clear, THROAT-clear, no lesions, no postnasal drip or exudate noted. Hoarse voice   NECK:  Supple w/ fair ROM; no JVD; normal carotid impulses w/o bruits; no thyromegaly or nodules palpated; no lymphadenopathy.  RESP  Clear  P & A; w/o, wheezes/ rales/ or rhonchi.  CARD:  RRR, no m/r/g  , tr peripheral edema, pulses intact, no cyanosis or clubbing.  Musco: Warm bil, no deformities or joint swelling noted.    Neuro: alert, no focal deficits noted.    Skin: Warm, no lesions or rashes      Assessment & Plan:  Sleep apnea Untreated. We will repeat her split-night sleep study   Cough With associated vocal cord dysfunction. She has seen speech therapy at [facility] and is currently on a GERD regimen, a rhinitis regimen and Neurontin. We will try decreasing her rhinitis regimen as she does not describe significant congestion.    Acute bronchitis Azithromycin Hycodan cough syrup

## 2014-08-09 NOTE — Assessment & Plan Note (Signed)
Azithromycin Hycodan cough syrup

## 2014-08-10 ENCOUNTER — Telehealth: Payer: Self-pay | Admitting: Internal Medicine

## 2014-08-10 ENCOUNTER — Ambulatory Visit: Payer: Medicare Other

## 2014-08-10 DIAGNOSIS — R499 Unspecified voice and resonance disorder: Secondary | ICD-10-CM

## 2014-08-10 DIAGNOSIS — J383 Other diseases of vocal cords: Secondary | ICD-10-CM | POA: Diagnosis not present

## 2014-08-10 NOTE — Therapy (Signed)
[facility] [address] [place], [place], [address] Phone: [phone]   Fax:  [phone]  Speech Language Pathology Treatment  Patient Details  Name: Christy May MRN: [id number] Date of Birth: [birth year] Referring Provider:  Biagio Borg, MD  Encounter Date: 08/10/2014      End of Session - 08/10/14 1630    Visit Number 4   Number of Visits 16   Date for SLP Re-Evaluation 09/18/14   SLP Start Time 11   SLP Stop Time  1613   SLP Time Calculation (min) 39 min   Activity Tolerance Patient tolerated treatment well      Past Medical History  Diagnosis Date  . Anemia, unspecified   . Extrinsic asthma, unspecified   . Chronic airway obstruction, not elsewhere classified   . Spondylosis of unspecified site without mention of myelopathy   . Dehydration   . Depressive disorder, not elsewhere classified   . Type II or unspecified type diabetes mellitus without mention of complication, not stated as uncontrolled   . Esophageal reflux   . Unspecified essential hypertension   . Unspecified menopausal and postmenopausal disorder   . Obesity, unspecified   . Inflammatory and toxic neuropathy, unspecified   . Unspecified sleep apnea   . Unspecified vitamin D deficiency   . Dysphagia 2007    historyof dysphagia with severe dysmotility by barium swallow-[name]  . Obesity   . Hypertension   . Diabetes mellitus without complication   . Allergic bronchitis   . Sleep apnea   . Gout 01/04/2013    Past Surgical History  Procedure Laterality Date  . Laser sugery      bilateral eyes  . Back surgery      fusion-multiple cervical spine levels-Dr. Arnoldo Morale  . Excision of abcess      ?? Chest  . Dilation and curettage of uterus    . Exploratory laparotomy    . Partial hysterectomy    . Total knee arthroplasty  2005    Left  . Total knee arthroplasty  2005    Right- [name]  . Foot surgery      Right X 2   . Cholecystectomy    . Appendectomy    . Tonsillectomy    . Abdominal hysterectomy    . Esophagogastroduodenoscopy N/A 05/08/2014    Procedure: ESOPHAGOGASTRODUODENOSCOPY (EGD);  Surgeon: [name], MD;  Location: [name];  Service: Endoscopy;  Laterality: N/A;  . Colonoscopy N/A 05/08/2014    Procedure: COLONOSCOPY;  Surgeon: [name], MD;  Location: [facility];  Service: Endoscopy;  Laterality: N/A;    There were no vitals taken for this visit.  Visit Diagnosis: Voice disorder           ADULT SLP TREATMENT - 08/10/14 1556    General Information   Behavior/Cognition Alert;Cooperative;Pleasant mood   Treatment Provided   Treatment provided Cognitive-Linquistic   Pain Assessment   Pain Assessment 0-10   Pain Score 7    Pain Location neck, rt arm   Pain Descriptors / Indicators Aching   Cognitive-Linquistic Treatment   Treatment focused on Voice   Skilled Treatment Pt practicing abdominal breathing (AB) as directed. Pt practiced AB in sentences with 80% success. In simple conversation, pt was 70% successful with min A rarely. Pt with vocal cord dysfunction (VCD) episode during session, SLP cued pt through relaxation exercises and sniff-blow.   Assessment / Recommendations / Plan   Plan Continue with  current plan of care   Progression Toward Goals   Progression toward goals Progressing toward goals          SLP Education - 08/10/14 1612    Education provided Yes   Education Details relaxation          SLP Short Term Goals - 08/10/14 1632    SLP SHORT TERM GOAL #1   Title Pt will converse for 28minutes using abdominal breathing with occassional min A. (due 08/23/14)   Time 3   Period Weeks   Status New   SLP SHORT TERM GOAL #2   Title Pt will reports successful achievement of relaxation techniques outside of therapy over 3 sessions (due 08/23/14)   Time 3   Period Weeks   Status On-going   SLP SHORT TERM GOAL #3   Title Pt will report decreased  frequency of VCD symptoms outside of therapy by 25% subjectively (due 08/23/14)   Time 3   Period Weeks   Status On-going          SLP Long Term Goals - 08/10/14 1632    SLP LONG TERM GOAL #1   Title Pt will demonstrate compensatory strategies  for relaxation outside of therapy over 6 sessions (due 09/18/14)   Time 7   Period Weeks   Status On-going   SLP LONG TERM GOAL #2   Title Pt will converse for 10 minutes with abdominal breathing with rare min A (due 09/18/14)   Time 7   Period Weeks   Status On-going   SLP LONG TERM GOAL #3   Title Pt will report decreased frequency of VCD symptoms outside of therapy by 50% subjectively (due 09/18/14)   Time 7   Period Weeks   Status On-going          Plan - 08/10/14 1631    Clinical Impression Statement Incr'd success today with AB in conversation. Pt requires cont ST to monitor/train carryover outside ST session.   Speech Therapy Frequency 2x / week   Duration --  7 weeks   Treatment/Interventions Internal/external aids;SLP instruction and feedback;Compensatory techniques;Environmental controls;Patient/family education   Potential to Achieve Goals Good        Problem List Patient Active Problem List   Diagnosis Date Noted  . Acute bronchitis 08/09/2014  . Acute upper respiratory infection 05/30/2014  . Acute esophagitis 05/08/2014  . Benign neoplasm of sigmoid colon 05/08/2014  . Change in bowel habits 04/17/2014  . Fecal incontinence 04/17/2014  . Urge incontinence 04/08/2014  . Cough 12/06/2013  . Chronic pain syndrome 08/24/2013  . Orthostasis 07/18/2013  . Acute kidney injury 07/18/2013  . Bilateral carpal tunnel syndrome 02/14/2013  . Gout 01/04/2013  . Failure to thrive 06/28/2012  . Gait disorder 06/28/2012  . Type 1 diabetes mellitus with neurological manifestations, uncontrolled 08/30/2011  . Preventative health care 08/23/2011  . Cervical radiculopathy 02/11/2011  . Low back pain 02/11/2011  . VITAMIN D  DEFICIENCY 11/22/2009  . ANEMIA-NOS 11/22/2009  . DEGENERATIVE JOINT DISEASE, CERVICAL SPINE 11/22/2009  . Depression 09/16/2009  . POLYNEUROPATHY 09/16/2009  . OBESITY 09/09/2009  . Essential hypertension 09/09/2009  . GERD 09/09/2009  . Sleep apnea 09/09/2009    [facility], SLP 08/10/2014, 4:33 PM  [facility] [address] [place], [place], [address] Phone: [phone]   Fax:  [phone]

## 2014-08-10 NOTE — Patient Instructions (Addendum)
   Please complete the assigned speech therapy homework before next session. Practice sniff-easy blow, one each minute you are practicing abdominal breathing.

## 2014-08-10 NOTE — Telephone Encounter (Signed)
Fell on 08/09/2014 In the kitchen and fell on her back side and back.   Pain went up to back side to her left side.    Pt call to just report fall

## 2014-08-13 ENCOUNTER — Other Ambulatory Visit: Payer: Self-pay | Admitting: Internal Medicine

## 2014-08-14 ENCOUNTER — Ambulatory Visit: Payer: Medicare Other

## 2014-08-14 DIAGNOSIS — J383 Other diseases of vocal cords: Secondary | ICD-10-CM

## 2014-08-14 NOTE — Patient Instructions (Signed)
  Please complete the assigned speech therapy homework before your next session.

## 2014-08-14 NOTE — Therapy (Signed)
[facility] [address] [place], [place], [address] Phone: [phone]   Fax:  [phone]  Speech Language Pathology Treatment  Patient Details  Name: Christy May MRN: [id number] Date of Birth: 02/23/1951 Referring Provider:  Biagio Borg, MD  Encounter Date: 08/14/2014      End of Session - 08/14/14 1622    Visit Number 5   Number of Visits 16   Date for SLP Re-Evaluation 09/18/14   SLP Start Time 1535   SLP Stop Time  75   SLP Time Calculation (min) 40 min   Activity Tolerance Patient tolerated treatment well      Past Medical History  Diagnosis Date  . Anemia, unspecified   . Extrinsic asthma, unspecified   . Chronic airway obstruction, not elsewhere classified   . Spondylosis of unspecified site without mention of myelopathy   . Dehydration   . Depressive disorder, not elsewhere classified   . Type II or unspecified type diabetes mellitus without mention of complication, not stated as uncontrolled   . Esophageal reflux   . Unspecified essential hypertension   . Unspecified menopausal and postmenopausal disorder   . Obesity, unspecified   . Inflammatory and toxic neuropathy, unspecified   . Unspecified sleep apnea   . Unspecified vitamin D deficiency   . Dysphagia 2007    historyof dysphagia with severe dysmotility by barium swallow-[name]  . Obesity   . Hypertension   . Diabetes mellitus without complication   . Allergic bronchitis   . Sleep apnea   . Gout 01/04/2013    Past Surgical History  Procedure Laterality Date  . Laser sugery      bilateral eyes  . Back surgery      fusion-multiple cervical spine levels-Dr. Arnoldo Morale  . Excision of abcess      ?? Chest  . Dilation and curettage of uterus    . Exploratory laparotomy    . Partial hysterectomy    . Total knee arthroplasty  2005    Left  . Total knee arthroplasty  2005    Right- [name]  . Foot surgery      Right X 2   . Cholecystectomy    . Appendectomy    . Tonsillectomy    . Abdominal hysterectomy    . Esophagogastroduodenoscopy N/A 05/08/2014    Procedure: ESOPHAGOGASTRODUODENOSCOPY (EGD);  Surgeon: [name], MD;  Location: [name];  Service: Endoscopy;  Laterality: N/A;  . Colonoscopy N/A 05/08/2014    Procedure: COLONOSCOPY;  Surgeon: [name], MD;  Location: [facility];  Service: Endoscopy;  Laterality: N/A;    There were no vitals taken for this visit.  Visit Diagnosis: Vocal cord dysfunction      Subjective Assessment - 08/14/14 1545    Symptoms "It's hard to believe (the sniffing) really helps a lot." "(The sniff-blow) takes (the cough feeling) away right away."             ADULT SLP TREATMENT - 08/14/14 1548    General Information   Behavior/Cognition Alert;Cooperative;Pleasant mood   Treatment Provided   Treatment provided Cognitive-Linquistic   Pain Assessment   Pain Assessment 0-10   Pain Score 8    Pain Location --  rt shoulder, arm, lower neck   Pain Descriptors / Indicators Aching   Pain Intervention(s) Monitored during session   Cognitive-Linquistic Treatment   Treatment focused on Voice   Skilled Treatment While practicing abdominal breathing (AB) SLP heard rapid  vocal fold closure approx 30%. Pt with episode VCD at end of 5 minute practice time for AB. At this time, she req'd SLP cues to truly sniff as pt "sipped" through her mouth instead. Mod cues rarely needed for sniffing instead of "sipping". Pt reported noting during practice times that her AB was off and she was chest breathing. She reported being able to correct this.  SLP simulated VCD episodes with pt to have her practice with sniff-blow and pt successful approx 40% of the timel   Assessment / Recommendations / Plan   Plan Continue with current plan of care   Progression Toward Goals   Progression toward goals Progressing toward goals            SLP Short Term Goals - 08/14/14 1624     SLP SHORT TERM GOAL #1   Title Pt will converse for 5 minutes using abdominal breathing with occassional min A. (due 08/23/14)   Time 2   Period Weeks   Status Revised   SLP SHORT TERM GOAL #2   Title Pt will reports successful achievement of relaxation techniques outside of therapy over 3 sessions (due 08/23/14)   Time 2   Period Weeks   Status On-going   SLP SHORT TERM GOAL #3   Title Pt will report decreased frequency of VCD symptoms outside of therapy by 25% subjectively (due 08/23/14)   Time 2   Period Weeks   Status On-going          SLP Long Term Goals - 08/14/14 1630    SLP LONG TERM GOAL #1   Title Pt will demonstrate compensatory strategies  for relaxation outside of therapy over 6 sessions (due 09/18/14)   Time 6   Period Weeks   Status On-going   SLP LONG TERM GOAL #2   Title Pt will converse for 10 minutes with abdominal breathing with rare min A (due 09/18/14)   Time 6   Period Weeks   Status On-going   SLP LONG TERM GOAL #3   Title Pt will report decreased frequency of VCD symptoms outside of therapy by 50% subjectively (due 09/18/14)   Time 6   Period Weeks   Status On-going          Plan - 08/14/14 1622    Clinical Impression Statement Improved AB today at rest, good (not excellent) success with sniff- blow strategy without "sipping" and holding breath.   Speech Therapy Frequency 2x / week   Duration --  6 weeks   Treatment/Interventions Internal/external aids;SLP instruction and feedback;Compensatory techniques;Environmental controls;Patient/family education   Potential to Achieve Goals Good        Problem List Patient Active Problem List   Diagnosis Date Noted  . Acute bronchitis 08/09/2014  . Acute upper respiratory infection 05/30/2014  . Acute esophagitis 05/08/2014  . Benign neoplasm of sigmoid colon 05/08/2014  . Change in bowel habits 04/17/2014  . Fecal incontinence 04/17/2014  . Urge incontinence 04/08/2014  . Cough 12/06/2013  .  Chronic pain syndrome 08/24/2013  . Orthostasis 07/18/2013  . Acute kidney injury 07/18/2013  . Bilateral carpal tunnel syndrome 02/14/2013  . Gout 01/04/2013  . Failure to thrive 06/28/2012  . Gait disorder 06/28/2012  . Type 1 diabetes mellitus with neurological manifestations, uncontrolled 08/30/2011  . Preventative health care 08/23/2011  . Cervical radiculopathy 02/11/2011  . Low back pain 02/11/2011  . VITAMIN D DEFICIENCY 11/22/2009  . ANEMIA-NOS 11/22/2009  . DEGENERATIVE JOINT DISEASE, CERVICAL SPINE 11/22/2009  .  Depression 09/16/2009  . POLYNEUROPATHY 09/16/2009  . OBESITY 09/09/2009  . Essential hypertension 09/09/2009  . GERD 09/09/2009  . Sleep apnea 09/09/2009    Garald Balding, SLP 08/14/2014, 4:30 PM  [facility] [address] [place], [place], [address] Phone: [phone]   Fax:  [phone]

## 2014-08-15 ENCOUNTER — Ambulatory Visit: Payer: Medicare Other | Admitting: Podiatrist

## 2014-08-17 ENCOUNTER — Ambulatory Visit: Payer: Medicare Other

## 2014-08-21 ENCOUNTER — Other Ambulatory Visit: Payer: Self-pay | Admitting: Internal Medicine

## 2014-08-21 ENCOUNTER — Ambulatory Visit: Payer: Medicare Other | Attending: Internal Medicine

## 2014-08-21 ENCOUNTER — Other Ambulatory Visit: Payer: Self-pay | Admitting: Emergency Medicine

## 2014-08-21 DIAGNOSIS — J383 Other diseases of vocal cords: Secondary | ICD-10-CM | POA: Insufficient documentation

## 2014-08-21 NOTE — Therapy (Signed)
[facility] [address] [place], [place], [address] Phone: [phone]   Fax:  [phone]  Speech Language Pathology Treatment  Patient Details  Name: Christy May MRN: [id number] Date of Birth: [birth year] Referring Provider:  Biagio Borg, MD  Encounter Date: 08/21/2014      End of Session - 08/21/14 1633    Visit Number 6   Number of Visits 16   Date for SLP Re-Evaluation 09/18/14   SLP Start Time 1535   SLP Stop Time  1615   SLP Time Calculation (min) 40 min   Activity Tolerance Patient tolerated treatment well      Past Medical History  Diagnosis Date  . Anemia, unspecified   . Extrinsic asthma, unspecified   . Chronic airway obstruction, not elsewhere classified   . Spondylosis of unspecified site without mention of myelopathy   . Dehydration   . Depressive disorder, not elsewhere classified   . Type II or unspecified type diabetes mellitus without mention of complication, not stated as uncontrolled   . Esophageal reflux   . Unspecified essential hypertension   . Unspecified menopausal and postmenopausal disorder   . Obesity, unspecified   . Inflammatory and toxic neuropathy, unspecified   . Unspecified sleep apnea   . Unspecified vitamin D deficiency   . Dysphagia 2007    historyof dysphagia with severe dysmotility by barium swallow-[name]  . Obesity   . Hypertension   . Diabetes mellitus without complication   . Allergic bronchitis   . Sleep apnea   . Gout 01/04/2013    Past Surgical History  Procedure Laterality Date  . Laser sugery      bilateral eyes  . Back surgery      fusion-multiple cervical spine levels-Dr. Arnoldo Morale  . Excision of abcess      ?? Chest  . Dilation and curettage of uterus    . Exploratory laparotomy    . Partial hysterectomy    . Total knee arthroplasty  2005    Left  . Total knee arthroplasty  2005    Right- [name]  . Foot surgery      Right X 2   . Cholecystectomy    . Appendectomy    . Tonsillectomy    . Abdominal hysterectomy    . Esophagogastroduodenoscopy N/A 05/08/2014    Procedure: ESOPHAGOGASTRODUODENOSCOPY (EGD);  Surgeon: [name], MD;  Location: [name];  Service: Endoscopy;  Laterality: N/A;  . Colonoscopy N/A 05/08/2014    Procedure: COLONOSCOPY;  Surgeon: [name], MD;  Location: [facility];  Service: Endoscopy;  Laterality: N/A;    There were no vitals taken for this visit.  Visit Diagnosis: Vocal cord dysfunction      Subjective Assessment - 08/21/14 1600    Symptoms Pt reports waking up and coughing at night routinely.             ADULT SLP TREATMENT - 08/21/14 1614    General Information   Behavior/Cognition Alert;Cooperative;Pleasant mood   Treatment Provided   Treatment provided Cognitive-Linquistic   Pain Assessment   Pain Assessment 0-10   Pain Score 5    Pain Location rt  shoulder/arm   Pain Descriptors / Indicators Aching   Pain Intervention(s) Monitored during session   Cognitive-Linquistic Treatment   Treatment focused on Voice   Skilled Treatment Pt spoke to SLP for 7 minutes utilizing abdominal breathing. VCD episode occurred while pt talking about abdominal breathing, and pt instantly  began sniff-easy exhale x4-5. SLP cued pt to think about relaxing images (mod A). In approx 2 minutes episode subsided and pt without another episode the remainder of session (25 minutes). Pt mentioned what appeared to be signs of GERD/laryngopharyngeal reflux at night (waking up coughing, metallic taste in mouth in morning) . SLP educated pt re: GERD diet, showed videos of GERD, and go to bed >3 hours after eating night meal.    Assessment / Recommendations / [place] with current plan of care   Progression Toward Goals   Progression toward goals Progressing toward goals          SLP Education - 08/21/14 1632    Education Details GERD symptoms/hints to reduce GERD    Person(s) Educated Patient   Methods Explanation;Other (comment)  video   Comprehension Verbalized understanding          SLP Short Term Goals - 08/21/14 1634    SLP SHORT TERM GOAL #1   Title Pt will converse for 5 minutes using abdominal breathing with occassional min A. (due 08/23/14)   Status Achieved   SLP SHORT TERM GOAL #2   Title Pt will reports successful achievement of relaxation techniques outside of therapy over 3 sessions (due 08/23/14)   Time 1   Period Weeks   Status On-going   SLP SHORT TERM GOAL #3   Title Pt will report decreased frequency of VCD symptoms outside of therapy by 25% subjectively (due 08/23/14)   Time 1   Period Weeks   Status On-going          SLP Long Term Goals - 08/21/14 1635    SLP LONG TERM GOAL #1   Title Pt will demonstrate compensatory strategies  for relaxation outside of therapy over 6 sessions (due 09/18/14)   Time 5   Period Weeks   Status On-going   SLP LONG TERM GOAL #2   Title Pt will converse for 10 minutes with abdominal breathing with rare min A (due 09/18/14)   Time 5   Period Weeks   Status On-going   SLP LONG TERM GOAL #3   Title Pt will report decreased frequency of VCD symptoms outside of therapy by 50% subjectively (due 09/18/14)   Time 5   Period Weeks   Status On-going          Plan - 08/21/14 1633    Clinical Impression Statement Pt with cont ST needed to maximize ability with compensatory measures dealing with vocal cord dysfunction   Speech Therapy Frequency 2x / week   Duration --  5 weeks   Treatment/Interventions Internal/external aids;SLP instruction and feedback;Compensatory techniques;Environmental controls;Patient/family education   Potential to Achieve Goals Good   Potential Considerations Severity of impairments        Problem List Patient Active Problem List   Diagnosis Date Noted  . Acute bronchitis 08/09/2014  . Acute upper respiratory infection 05/30/2014  . Acute esophagitis  05/08/2014  . Benign neoplasm of sigmoid colon 05/08/2014  . Change in bowel habits 04/17/2014  . Fecal incontinence 04/17/2014  . Urge incontinence 04/08/2014  . Cough 12/06/2013  . Chronic pain syndrome 08/24/2013  . Orthostasis 07/18/2013  . Acute kidney injury 07/18/2013  . Bilateral carpal tunnel syndrome 02/14/2013  . Gout 01/04/2013  . Failure to thrive 06/28/2012  . Gait disorder 06/28/2012  . Type 1 diabetes mellitus with neurological manifestations, uncontrolled 08/30/2011  . Preventative health care 08/23/2011  . Cervical radiculopathy 02/11/2011  . Low  back pain 02/11/2011  . VITAMIN D DEFICIENCY 11/22/2009  . ANEMIA-NOS 11/22/2009  . DEGENERATIVE JOINT DISEASE, CERVICAL SPINE 11/22/2009  . Depression 09/16/2009  . POLYNEUROPATHY 09/16/2009  . OBESITY 09/09/2009  . Essential hypertension 09/09/2009  . GERD 09/09/2009  . Sleep apnea 09/09/2009    [facility], SLP 08/21/2014, 4:36 PM  [facility] [address] [place], [place], [address] Phone: [phone]   Fax:  [phone]

## 2014-08-21 NOTE — Patient Instructions (Signed)
  Try to eat 3 hours before going to bed at night  Limit/eliminate foods that cause GERD  Refer to other infomration you have rec'd about GERD in the past from other healthcare professionals

## 2014-08-23 DIAGNOSIS — Z0279 Encounter for issue of other medical certificate: Secondary | ICD-10-CM

## 2014-08-24 ENCOUNTER — Telehealth: Payer: Self-pay | Admitting: Endocrinology

## 2014-08-24 ENCOUNTER — Ambulatory Visit: Payer: Medicare Other

## 2014-08-24 NOTE — Telephone Encounter (Signed)
Pt advised of note below and voiced understanding.  

## 2014-08-24 NOTE — Telephone Encounter (Signed)
Please call pt about her sugar level they are running low

## 2014-08-24 NOTE — Telephone Encounter (Signed)
Please reduce the lantus to 100 units qd

## 2014-08-24 NOTE — Telephone Encounter (Signed)
I contacted pt. For 7 days her fasting blood sugar has been as follows. 110, 165, 59, 127, 57, 57, and 78. Pt states she is currently participating in a [employer] fast at her church. Due to her lower blood sugar readings she hs reduced her Lantus to 220 units in the morning and some days she will even skip the supper Humalog dosage.  Please advise,  Thanks!

## 2014-08-28 ENCOUNTER — Ambulatory Visit: Payer: Medicare Other

## 2014-08-28 DIAGNOSIS — J383 Other diseases of vocal cords: Secondary | ICD-10-CM

## 2014-08-28 NOTE — Therapy (Signed)
[facility] [address] [place], [place], [address] Phone: [phone]   Fax:  [phone]  Speech Language Pathology Treatment  Patient Details  Name: Christy May MRN: [id number] Date of Birth: 03-16-1951 Referring Provider:  Biagio Borg, MD  Encounter Date: 08/28/2014      End of Session - 08/28/14 1609    Visit Number 7   Number of Visits 16   Date for SLP Re-Evaluation 09/18/14   SLP Start Time 1533   SLP Stop Time  1615   SLP Time Calculation (min) 42 min   Activity Tolerance Patient tolerated treatment well      Past Medical History  Diagnosis Date  . Anemia, unspecified   . Extrinsic asthma, unspecified   . Chronic airway obstruction, not elsewhere classified   . Spondylosis of unspecified site without mention of myelopathy   . Dehydration   . Depressive disorder, not elsewhere classified   . Type II or unspecified type diabetes mellitus without mention of complication, not stated as uncontrolled   . Esophageal reflux   . Unspecified essential hypertension   . Unspecified menopausal and postmenopausal disorder   . Obesity, unspecified   . Inflammatory and toxic neuropathy, unspecified   . Unspecified sleep apnea   . Unspecified vitamin D deficiency   . Dysphagia 2007    historyof dysphagia with severe dysmotility by barium swallow-[name]  . Obesity   . Hypertension   . Diabetes mellitus without complication   . Allergic bronchitis   . Sleep apnea   . Gout 01/04/2013    Past Surgical History  Procedure Laterality Date  . Laser sugery      bilateral eyes  . Back surgery      fusion-multiple cervical spine levels-Dr. Arnoldo Morale  . Excision of abcess      ?? Chest  . Dilation and curettage of uterus    . Exploratory laparotomy    . Partial hysterectomy    . Total knee arthroplasty  2005    Left  . Total knee arthroplasty  2005    Right- [name]  . Foot surgery      Right X 2   . Cholecystectomy    . Appendectomy    . Tonsillectomy    . Abdominal hysterectomy    . Esophagogastroduodenoscopy N/A 05/08/2014    Procedure: ESOPHAGOGASTRODUODENOSCOPY (EGD);  Surgeon: [name], MD;  Location: [name];  Service: Endoscopy;  Laterality: N/A;  . Colonoscopy N/A 05/08/2014    Procedure: COLONOSCOPY;  Surgeon: [name], MD;  Location: [facility];  Service: Endoscopy;  Laterality: N/A;    There were no vitals taken for this visit.  Visit Diagnosis: No diagnosis found.      Subjective Assessment - 08/28/14 1548    Symptoms Patient reports having a busy weekend.             ADULT SLP TREATMENT - 08/28/14 1551    General Information   Behavior/Cognition Alert;Cooperative;Pleasant mood   Treatment Provided   Treatment provided Cognitive-Linquistic   Pain Assessment   Pain Assessment 0-10   Pain Score 7    Pain Location rt shoulder, arm   Pain Descriptors / Indicators Aching   Pain Intervention(s) Monitored during session   Cognitive-Linquistic Treatment   Treatment focused on Voice   Skilled Treatment Pt spoke 35 minutes without coughing episode, use of abdominal breathing was good to excellent. Only 3 episodes since last Tuesday 08-21-14. Pt has cont  to use sniff-gentle exhale over the last week.    Assessment / Recommendations / Plan   Plan Continue with current plan of care   Progression Toward Goals   Progression toward goals Progressing toward goals            SLP Short Term Goals - 08/28/14 1611    SLP SHORT TERM GOAL #1   Title Pt will converse for 5 minutes using abdominal breathing with occassional min A. (due 08/23/14)   Status Achieved   SLP SHORT TERM GOAL #2   Title Pt will reports successful achievement of relaxation techniques outside of therapy over 3 sessions (due 08/23/14)   Status Achieved   SLP SHORT TERM GOAL #3   Title Pt will report decreased frequency of VCD symptoms outside of therapy by 25% subjectively (due  08/23/14)   Status Achieved          SLP Long Term Goals - 08/28/14 1611    SLP LONG TERM GOAL #1   Title Pt will demonstrate compensatory strategies  for relaxation outside of therapy over 6 sessions (due 09/18/14)   Time 4   Period Weeks   Status On-going   SLP LONG TERM GOAL #2   Title Pt will converse for 10 minutes with abdominal breathing with rare min A (due 09/18/14)   Status Achieved   SLP LONG TERM GOAL #3   Title Pt will report decreased frequency of VCD symptoms outside of therapy by 50% subjectively (due 09/18/14)   Status Achieved          Plan - 08/28/14 1610    Clinical Impression Statement Pt met some STGs and LTGs today. Will decr to x1/week possibly beginning next week.   Speech Therapy Frequency 2x / week   Duration 4 weeks   Treatment/Interventions Internal/external aids;SLP instruction and feedback;Compensatory techniques;Environmental controls;Patient/family education   Potential to Achieve Goals Good   Potential Considerations Severity of impairments   Consulted and Agree with Plan of Care Patient        Problem List Patient Active Problem List   Diagnosis Date Noted  . Acute bronchitis 08/09/2014  . Acute upper respiratory infection 05/30/2014  . Acute esophagitis 05/08/2014  . Benign neoplasm of sigmoid colon 05/08/2014  . Change in bowel habits 04/17/2014  . Fecal incontinence 04/17/2014  . Urge incontinence 04/08/2014  . Cough 12/06/2013  . Chronic pain syndrome 08/24/2013  . Orthostasis 07/18/2013  . Acute kidney injury 07/18/2013  . Bilateral carpal tunnel syndrome 02/14/2013  . Gout 01/04/2013  . Failure to thrive 06/28/2012  . Gait disorder 06/28/2012  . Type 1 diabetes mellitus with neurological manifestations, uncontrolled 08/30/2011  . Preventative health care 08/23/2011  . Cervical radiculopathy 02/11/2011  . Low back pain 02/11/2011  . VITAMIN D DEFICIENCY 11/22/2009  . ANEMIA-NOS 11/22/2009  . DEGENERATIVE JOINT DISEASE,  CERVICAL SPINE 11/22/2009  . Depression 09/16/2009  . POLYNEUROPATHY 09/16/2009  . OBESITY 09/09/2009  . Essential hypertension 09/09/2009  . GERD 09/09/2009  . Sleep apnea 09/09/2009    Garald Balding, SLP 08/28/2014, 4:12 PM  [facility] [address] [place], [place], [address] Phone: [phone]   Fax:  [phone]

## 2014-08-29 ENCOUNTER — Emergency Department (HOSPITAL_COMMUNITY): Payer: Medicare Other

## 2014-08-29 ENCOUNTER — Encounter (HOSPITAL_COMMUNITY): Payer: Self-pay | Admitting: Emergency Medicine

## 2014-08-29 ENCOUNTER — Emergency Department (HOSPITAL_COMMUNITY)
Admission: EM | Admit: 2014-08-29 | Discharge: 2014-08-30 | Disposition: A | Discharge status: Home / Self Care | Payer: Medicare Other | Attending: Emergency Medicine | Admitting: Emergency Medicine

## 2014-08-29 DIAGNOSIS — Z862 Personal history of diseases of the blood and blood-forming organs and certain disorders involving the immune mechanism: Secondary | ICD-10-CM | POA: Diagnosis not present

## 2014-08-29 DIAGNOSIS — Z7951 Long term (current) use of inhaled steroids: Secondary | ICD-10-CM | POA: Insufficient documentation

## 2014-08-29 DIAGNOSIS — E669 Obesity, unspecified: Secondary | ICD-10-CM | POA: Insufficient documentation

## 2014-08-29 DIAGNOSIS — E559 Vitamin D deficiency, unspecified: Secondary | ICD-10-CM | POA: Diagnosis not present

## 2014-08-29 DIAGNOSIS — Z79899 Other long term (current) drug therapy: Secondary | ICD-10-CM | POA: Diagnosis not present

## 2014-08-29 DIAGNOSIS — Z8659 Personal history of other mental and behavioral disorders: Secondary | ICD-10-CM | POA: Insufficient documentation

## 2014-08-29 DIAGNOSIS — R1031 Right lower quadrant pain: Secondary | ICD-10-CM | POA: Insufficient documentation

## 2014-08-29 DIAGNOSIS — J45909 Unspecified asthma, uncomplicated: Secondary | ICD-10-CM | POA: Insufficient documentation

## 2014-08-29 DIAGNOSIS — I10 Essential (primary) hypertension: Secondary | ICD-10-CM | POA: Diagnosis not present

## 2014-08-29 DIAGNOSIS — R531 Weakness: Secondary | ICD-10-CM | POA: Diagnosis not present

## 2014-08-29 DIAGNOSIS — Z8669 Personal history of other diseases of the nervous system and sense organs: Secondary | ICD-10-CM | POA: Diagnosis not present

## 2014-08-29 DIAGNOSIS — R404 Transient alteration of awareness: Secondary | ICD-10-CM | POA: Diagnosis not present

## 2014-08-29 DIAGNOSIS — E119 Type 2 diabetes mellitus without complications: Secondary | ICD-10-CM | POA: Insufficient documentation

## 2014-08-29 DIAGNOSIS — Z8742 Personal history of other diseases of the female genital tract: Secondary | ICD-10-CM | POA: Diagnosis not present

## 2014-08-29 DIAGNOSIS — R5383 Other fatigue: Secondary | ICD-10-CM | POA: Diagnosis present

## 2014-08-29 DIAGNOSIS — Z87891 Personal history of nicotine dependence: Secondary | ICD-10-CM | POA: Insufficient documentation

## 2014-08-29 DIAGNOSIS — J069 Acute upper respiratory infection, unspecified: Secondary | ICD-10-CM | POA: Diagnosis not present

## 2014-08-29 DIAGNOSIS — Z8739 Personal history of other diseases of the musculoskeletal system and connective tissue: Secondary | ICD-10-CM | POA: Insufficient documentation

## 2014-08-29 DIAGNOSIS — Z880 Allergy status to penicillin: Secondary | ICD-10-CM | POA: Insufficient documentation

## 2014-08-29 DIAGNOSIS — R05 Cough: Secondary | ICD-10-CM | POA: Diagnosis not present

## 2014-08-29 DIAGNOSIS — Z794 Long term (current) use of insulin: Secondary | ICD-10-CM | POA: Insufficient documentation

## 2014-08-29 DIAGNOSIS — Z7982 Long term (current) use of aspirin: Secondary | ICD-10-CM | POA: Diagnosis not present

## 2014-08-29 DIAGNOSIS — K219 Gastro-esophageal reflux disease without esophagitis: Secondary | ICD-10-CM | POA: Insufficient documentation

## 2014-08-29 DIAGNOSIS — R109 Unspecified abdominal pain: Secondary | ICD-10-CM

## 2014-08-29 LAB — COMPREHENSIVE METABOLIC PANEL
ALK PHOS: 53 U/L (ref 39–117)
ALT: 18 U/L (ref 0–35)
AST: 20 U/L (ref 0–37)
Albumin: 3.1 g/dL — ABNORMAL LOW (ref 3.5–5.2)
Anion gap: 8 (ref 5–15)
BUN: 21 mg/dL (ref 6–23)
CHLORIDE: 99 mmol/L (ref 96–112)
CO2: 25 mmol/L (ref 19–32)
Calcium: 8.6 mg/dL (ref 8.4–10.5)
Creatinine, Ser: 1.66 mg/dL — ABNORMAL HIGH (ref 0.50–1.10)
GFR calc non Af Amer: 32 mL/min — ABNORMAL LOW (ref >90)
GFR, EST AFRICAN AMERICAN: 37 mL/min — AB (ref >90)
GLUCOSE: 298 mg/dL — AB (ref 70–99)
Potassium: 4.2 mmol/L (ref 3.5–5.1)
Sodium: 132 mmol/L — ABNORMAL LOW (ref 135–145)
TOTAL PROTEIN: 6.8 g/dL (ref 6.0–8.3)
Total Bilirubin: 0.4 mg/dL (ref 0.3–1.2)

## 2014-08-29 LAB — CBC WITH DIFFERENTIAL/PLATELET
BASOS PCT: 0 % (ref 0–1)
Basophils Absolute: 0 10*3/uL (ref 0.0–0.1)
Eosinophils Absolute: 0 10*3/uL (ref 0.0–0.7)
Eosinophils Relative: 0 % (ref 0–5)
HCT: 35.9 % — ABNORMAL LOW (ref 36.0–46.0)
Hemoglobin: 11.3 g/dL — ABNORMAL LOW (ref 12.0–15.0)
LYMPHS ABS: 1.3 10*3/uL (ref 0.7–4.0)
LYMPHS PCT: 19 % (ref 12–46)
MCH: 26.5 pg (ref 26.0–34.0)
MCHC: 31.5 g/dL (ref 30.0–36.0)
MCV: 84.1 fL (ref 78.0–100.0)
Monocytes Absolute: 0.8 10*3/uL (ref 0.1–1.0)
Monocytes Relative: 12 % (ref 3–12)
Neutro Abs: 4.6 10*3/uL (ref 1.7–7.7)
Neutrophils Relative %: 69 % (ref 43–77)
Platelets: 137 10*3/uL — ABNORMAL LOW (ref 150–400)
RBC: 4.27 MIL/uL (ref 3.87–5.11)
RDW: 16.4 % — ABNORMAL HIGH (ref 11.5–15.5)
WBC: 6.7 10*3/uL (ref 4.0–10.5)

## 2014-08-29 MED ORDER — IOHEXOL 300 MG/ML  SOLN
25.0000 mL | INTRAMUSCULAR | Status: AC
  Administered 2014-08-29: 25 mL via ORAL

## 2014-08-29 NOTE — ED Notes (Signed)
Pt arrives from home via [facility], states she has had generalized weakness since yesterday with productive cough. Pt alert, oriented x4. Nad.

## 2014-08-29 NOTE — ED Provider Notes (Signed)
CSN: [id number]     Arrival date & time 08/29/14  2220 History   First MD Initiated Contact with Patient 08/29/14 2225     Chief Complaint  Patient presents with  . Fatigue     (Consider location/radiation/quality/duration/timing/severity/associated sxs/prior Treatment) Patient is a 64 y.o. female presenting with cough.  Cough Cough characteristics:  Productive Sputum characteristics:  White Severity:  Moderate Onset quality:  Gradual Duration:  2 days Timing:  Constant Progression:  Unchanged Chronicity:  Recurrent Smoker: no   Context: upper respiratory infection   Relieved by:  Nothing Worsened by:  Nothing tried Associated symptoms: chills and sinus congestion   Associated symptoms: no chest pain, no fever and no rash   Associated symptoms comment:  Diarrhea   Past Medical History  Diagnosis Date  . Anemia, unspecified   . Extrinsic asthma, unspecified   . Chronic airway obstruction, not elsewhere classified   . Spondylosis of unspecified site without mention of myelopathy   . Dehydration   . Depressive disorder, not elsewhere classified   . Type II or unspecified type diabetes mellitus without mention of complication, not stated as uncontrolled   . Esophageal reflux   . Unspecified essential hypertension   . Unspecified menopausal and postmenopausal disorder   . Obesity, unspecified   . Inflammatory and toxic neuropathy, unspecified   . Unspecified sleep apnea   . Unspecified vitamin D deficiency   . Dysphagia 2007    historyof dysphagia with severe dysmotility by barium swallow-[name]  . Obesity   . Hypertension   . Diabetes mellitus without complication   . Allergic bronchitis   . Sleep apnea   . Gout 01/04/2013   Past Surgical History  Procedure Laterality Date  . Laser sugery      bilateral eyes  . Back surgery      fusion-multiple cervical spine levels-Dr. Arnoldo Morale  . Excision of abcess      ?? Chest  . Dilation and curettage of uterus    .  Exploratory laparotomy    . Partial hysterectomy    . Total knee arthroplasty  2005    Left  . Total knee arthroplasty  2005    Right- [name]  . Foot surgery      Right X 2  . Cholecystectomy    . Appendectomy    . Tonsillectomy    . Abdominal hysterectomy    . Esophagogastroduodenoscopy N/A 05/08/2014    Procedure: ESOPHAGOGASTRODUODENOSCOPY (EGD);  Surgeon: [name], MD;  Location: [name];  Service: Endoscopy;  Laterality: N/A;  . Colonoscopy N/A 05/08/2014    Procedure: COLONOSCOPY;  Surgeon: [name], MD;  Location: [facility];  Service: Endoscopy;  Laterality: N/A;   Family History  Problem Relation Age of Onset  . Esophageal cancer Brother   . Esophageal cancer Sister     ?  . Colon polyps Brother   . Pancreatic cancer Sister     ?  . Diabetes Sister   . Diabetes      Aunt and Uncle  . Heart disease Maternal Grandfather    History  Substance Use Topics  . Smoking status: Former Smoker    Quit date: 06/22/1986  . Smokeless tobacco: Never Used  . Alcohol Use: No   OB History    No data available     Review of Systems  Constitutional: Positive for chills. Negative for fever.  Respiratory: Positive for cough.   Cardiovascular: Negative for chest pain.  Skin: Negative  for rash.  All other systems reviewed and are negative.     Allergies  Ace inhibitors; Penicillins; Tape; and Adhesive  Home Medications   Prior to Admission medications   Medication Sig Start Date End Date Taking? Authorizing Provider  albuterol (PROVENTIL) (2.5 MG/3ML) 0.083% nebulizer solution Take 2.5 mg by nebulization every 6 (six) hours as needed for wheezing or shortness of breath.    Historical Provider, MD  aspirin 81 MG tablet Take 81 mg by mouth at bedtime.     Historical Provider, MD  azithromycin (ZITHROMAX Z-PAK) 250 MG tablet Use as directed 05/31/14   Biagio Borg, MD  azithromycin (ZITHROMAX Z-PAK) 250 MG tablet Take 2 today, then 1 daily until gone.  08/09/14   Collene Gobble, MD  benzonatate (TESSALON) 100 MG capsule Take 1 capsule (100 mg total) by mouth 3 (three) times daily as needed for cough. 08/30/14   Kristen N Ward, DO  cholecalciferol (VITAMIN D) 1000 UNITS tablet Take 2,000 Units by mouth every morning.     Historical Provider, MD  cyclobenzaprine (FLEXERIL) 5 MG tablet TAKE ONE TABLET BY MOUTH THREE TIMES DAILY AS NEEDED FOR MUSCLE SPASMS 07/24/14   Biagio Borg, MD  cyclobenzaprine (FLEXERIL) 5 MG tablet TAKE 1 TABLET BY MOUTH THREE TIMES DAILY AS NEEDED FOR MUSCLE SPASMS 08/21/14   Biagio Borg, MD  dextromethorphan (DELSYM) 30 MG/5ML liquid Take 30 mg by mouth 3 (three) times daily as needed for cough.    Historical Provider, MD  diclofenac sodium (VOLTAREN) 1 % GEL Apply 4 g topically 4 (four) times daily as needed (right hand, arm and neck pain.).    Historical Provider, MD  esomeprazole (NEXIUM) 40 MG capsule Take 1 capsule (40 mg total) by mouth 2 (two) times daily. 03/13/14   Collene Gobble, MD  famotidine (PEPCID) 20 MG tablet TAKE 1 TABLET BY MOUTH EVERY NIGHT AT BEDTIME 08/21/14   Collene Gobble, MD  fluticasone [facility]) 50 MCG/ACT nasal spray Place 2 sprays into both nostrils every morning. 04/26/14   Collene Gobble, MD  gabapentin (NEURONTIN) 800 MG tablet Take 800 mg by mouth 3 (three) times daily.    Historical Provider, MD  gabapentin (NEURONTIN) 800 MG tablet TAKE 1 TABLET BY MOUTH THREE TIMES DAILY 06/27/14   Biagio Borg, MD  glucose blood (ONE TOUCH ULTRA TEST) test strip Use to test blood sugars twice a day. Dx 250.63 10/06/13   Renato Shin, MD  HYDROcodone-homatropine [facility]) 5-1.5 MG/5ML syrup Take 5 mLs by mouth every 6 (six) hours as needed for cough. 06/20/14   Collene Gobble, MD  HYDROcodone-homatropine [facility]) 5-1.5 MG/5ML syrup Take 5 mLs by mouth every 6 (six) hours as needed for cough. 08/09/14   Collene Gobble, MD  Insulin Glargine (LANTUS) 100 UNIT/ML Solostar Pen Inject 270 Units into the skin every morning.  And pen needles 2/day 07/25/14   Renato Shin, MD  insulin lispro (HUMALOG KWIKPEN) 100 UNIT/ML KiwkPen Inject 0.3 mLs (30 Units total) into the skin daily with supper. 07/25/14   Renato Shin, MD  irbesartan (AVAPRO) 300 MG tablet Take 300 mg by mouth every morning.     Historical Provider, MD  irbesartan (AVAPRO) 300 MG tablet TAKE 1 TABLET BY MOUTH DAILY 08/21/14   Biagio Borg, MD  Lancets [facility] ULTRASOFT) lancets Use to test blood sugars twice a day. 07/20/13   Renato Shin, MD  loratadine (CLARITIN) 10 MG tablet Take 10 mg by mouth every morning.  Historical Provider, MD  loratadine (CLARITIN) 10 MG tablet TAKE 1 TABLET BY MOUTH EVERY DAY 07/26/14   Collene Gobble, MD  metoprolol (LOPRESSOR) 50 MG tablet Take 75 mg by mouth 2 (two) times daily.    Historical Provider, MD  montelukast (SINGULAIR) 10 MG tablet Take 10 mg by mouth at bedtime.    Historical Provider, MD  nystatin cream (MYCOSTATIN) Apply to affected area 2 times daily 12/06/13   Biagio Borg, MD  ondansetron (ZOFRAN ODT) 4 MG disintegrating tablet Take 1 tablet (4 mg total) by mouth every 8 (eight) hours as needed for nausea or vomiting. 08/30/14   Kristen N Ward, DO  oxybutynin (DITROPAN-XL) 10 MG 24 hr tablet Take 10 mg by mouth every morning.     Historical Provider, MD  oxybutynin (DITROPAN-XL) 10 MG 24 hr tablet TAKE 1 TABLET BY MOUTH DAILY 08/13/14   Biagio Borg, MD  promethazine (PHENERGAN) 25 MG tablet TAKE 1 TABLET BY MOUTH EVERY 6 HOURS AS NEEDED FOR NAUSEA AND VOMITING 08/21/14   Biagio Borg, MD   BP 122/68 mmHg  Pulse 93  Temp(Src) 98.1 F (36.7 C) (Oral)  Resp 18  SpO2 97% Physical Exam  Constitutional: She is oriented to person, place, and time. She appears well-developed and well-nourished.  HENT:  Head: Normocephalic and atraumatic.  Right Ear: External ear normal.  Left Ear: External ear normal.  Eyes: Conjunctivae and EOM are normal. Pupils are equal, round, and reactive to light.  Neck: Normal range of  motion. Neck supple.  Cardiovascular: Normal rate, regular rhythm, normal heart sounds and intact distal pulses.   Pulmonary/Chest: Effort normal and breath sounds normal.  Abdominal: Soft. Bowel sounds are normal. There is tenderness in the right lower quadrant.  Musculoskeletal: Normal range of motion.  Neurological: She is alert and oriented to person, place, and time.  Skin: Skin is warm and dry.  Vitals reviewed.   ED Course  Procedures (including critical care time) Labs Review Labs Reviewed  CBC WITH DIFFERENTIAL/PLATELET - Abnormal; Notable for the following:    Hemoglobin 11.3 (*)    HCT 35.9 (*)    RDW 16.4 (*)    Platelets 137 (*)    All other components within normal limits  COMPREHENSIVE METABOLIC PANEL - Abnormal; Notable for the following:    Sodium 132 (*)    Glucose, Bld 298 (*)    Creatinine, Ser 1.66 (*)    Albumin 3.1 (*)    GFR calc non Af Amer 32 (*)    GFR calc Af Amer 37 (*)    All other components within normal limits  URINALYSIS, ROUTINE W REFLEX MICROSCOPIC - Abnormal; Notable for the following:    Specific Gravity, Urine 1.038 (*)    Glucose, UA 250 (*)    All other components within normal limits    Imaging Review Dg Chest 2 View  08/29/2014   CLINICAL DATA:  Productive cough for 6 days with shortness of breath.  EXAM: CHEST  2 VIEW  COMPARISON:  05/23/2014  FINDINGS: The study is limited due to patient's body habitus. Specifically, the AP view is very difficult to interpret due to the overlying soft tissues. Difficult to exclude enlargement of the cardiac silhouette. There are low lung volumes. No evidence for pleural effusions on the lateral view. Prominent central vascular structures and cannot exclude mild edema. Surgical plate in the cervical spine.  IMPRESSION: Limited examination due to body habitus.  Prominent central vascular structures and cannot  exclude mild edema.   Electronically Signed   By: Markus Daft M.D.   On: 08/29/2014 23:23   Ct  Abdomen Pelvis W Contrast  08/30/2014   CLINICAL DATA:  Generalized weakness and abdominal pain since yesterday. Productive cough.  EXAM: CT ABDOMEN AND PELVIS WITH CONTRAST  TECHNIQUE: Multidetector CT imaging of the abdomen and pelvis was performed using the standard protocol following bolus administration of intravenous contrast.  CONTRAST:  80 mL Omnipaque 300 IV  COMPARISON:  CT 04/17/2012  FINDINGS: The heart is enlarged. There is atelectasis at the lung bases. Patient motion limits evaluation.  There is hepatomegaly. No focal hepatic lesion. Patient is postcholecystectomy. Dilatation of the common bile duct appear similar to prior exam, less well-defined currently due to motion. The spleen, pancreas, and adrenal glands are normal. Kidneys demonstrate symmetric enhancement and excretion. No hydronephrosis. Small hypodensities within both kidneys unchanged, likely cysts.  The stomach is physiologically distended. There are no dilated or thickened bowel loops. The colon is redundant without localizing abnormality. No colonic wall thickening. Moderate stool throughout. The appendix is not definitively identified. No free air, free fluid, or intra-abdominal fluid collection.  The abdominal aorta is normal in caliber with mild atherosclerosis. No retroperitoneal adenopathy.  Within the pelvis the urinary bladder is physiologically distended. Uterus is not seen presumably surgically absent. No adnexal mass. No pelvic free fluid. Small external iliac lymph nodes are unchanged from prior exam. No pelvic free fluid.  Multilevel facet arthropathy throughout the lumbar spine. No acute or suspicious osseous abnormality.  IMPRESSION: 1. No acute abnormality in the abdomen/pelvis. 2. Stable chronic findings include hepatomegaly, biliary dilatation postcholecystectomy, and small renal cysts.   Electronically Signed   By: Jeb Levering M.D.   On: 08/30/2014 01:00     EKG Interpretation   Date/Time:  Wednesday August 29 2014 22:22:04 EST Ventricular Rate:  97 PR Interval:  164 QRS Duration: 76 QT Interval:  344 QTC Calculation: 437 R Axis:   6 Text Interpretation:  Age not entered, assumed to be  64 years old for  purpose of ECG interpretation Sinus rhythm rate has decreased since last  tracing Confirmed by Debby Freiberg [phone]) on 08/29/2014 10:24:41 PM      MDM   Final diagnoses:  Abdominal pain  URI (upper respiratory infection)    64 y.o. female with pertinent PMH of DM presents with cough, malaise, diarrhea.  She is attempting to receive home O2, has not received yet for chronic dyspnea.  On arrival today, vitals and physical exam as above.  o2 sats 95% on my exam without O2.  She is able to ambulate.  Abd exam with generalized tenderness, questionably greater in the RLQ.  CT scan negative for acute pathology.  XR unremarkable.  Pt care to Dr. Leonides Schanz pending UA due to possibility of UTI as etiology of symptoms.  If negative plan to dc home    I have reviewed all laboratory and imaging studies if ordered as above  1. URI (upper respiratory infection)   2. Abdominal pain         Debby Freiberg, MD 08/30/14 1827

## 2014-08-29 NOTE — ED Notes (Signed)
CT aware patient is finished with oral contrast.

## 2014-08-30 ENCOUNTER — Ambulatory Visit: Payer: Medicare Other | Admitting: *Deleted

## 2014-08-30 ENCOUNTER — Emergency Department (HOSPITAL_COMMUNITY): Payer: Medicare Other

## 2014-08-30 ENCOUNTER — Encounter (HOSPITAL_COMMUNITY): Payer: Self-pay | Admitting: Radiology

## 2014-08-30 DIAGNOSIS — R05 Cough: Secondary | ICD-10-CM | POA: Diagnosis not present

## 2014-08-30 DIAGNOSIS — I10 Essential (primary) hypertension: Secondary | ICD-10-CM | POA: Diagnosis not present

## 2014-08-30 DIAGNOSIS — R109 Unspecified abdominal pain: Secondary | ICD-10-CM | POA: Diagnosis not present

## 2014-08-30 LAB — URINALYSIS, ROUTINE W REFLEX MICROSCOPIC
Bilirubin Urine: NEGATIVE
Glucose, UA: 250 mg/dL — AB
Hgb urine dipstick: NEGATIVE
Ketones, ur: NEGATIVE mg/dL
Leukocytes, UA: NEGATIVE
Nitrite: NEGATIVE
PROTEIN: NEGATIVE mg/dL
SPECIFIC GRAVITY, URINE: 1.038 — AB (ref 1.005–1.030)
UROBILINOGEN UA: 0.2 mg/dL (ref 0.0–1.0)
pH: 6 (ref 5.0–8.0)

## 2014-08-30 MED ORDER — SODIUM CHLORIDE 0.9 % IV BOLUS (SEPSIS)
1000.0000 mL | Freq: Once | INTRAVENOUS | Status: AC
  Administered 2014-08-30: 1000 mL via INTRAVENOUS

## 2014-08-30 MED ORDER — ONDANSETRON 4 MG PO TBDP: 4.0000 mg | ORAL_TABLET | Freq: Three times a day (TID) | ORAL | Status: DC | PRN

## 2014-08-30 MED ORDER — BENZONATATE 100 MG PO CAPS: 100.0000 mg | ORAL_CAPSULE | Freq: Three times a day (TID) | ORAL | Status: DC | PRN

## 2014-08-30 MED ORDER — IOHEXOL 300 MG/ML  SOLN
80.0000 mL | Freq: Once | INTRAMUSCULAR | Status: AC | PRN
  Administered 2014-08-30: 80 mL via INTRAVENOUS

## 2014-08-30 NOTE — ED Notes (Signed)
PTAR called by secretary.  

## 2014-08-30 NOTE — Discharge Instructions (Signed)
Upper Respiratory Infection, Adult An upper respiratory infection (URI) is also sometimes known as the common cold. The upper respiratory tract includes the nose, sinuses, throat, trachea, and bronchi. Bronchi are the airways leading to the lungs. Most people improve within 1 week, but symptoms can last up to 2 weeks. A residual cough may last even longer.  CAUSES Many different viruses can infect the tissues lining the upper respiratory tract. The tissues become irritated and inflamed and often become very moist. Mucus production is also common. A cold is contagious. You can easily spread the virus to others by oral contact. This includes kissing, sharing a glass, coughing, or sneezing. Touching your mouth or nose and then touching a surface, which is then touched by another person, can also spread the virus. SYMPTOMS  Symptoms typically develop 1 to 3 days after you come in contact with a cold virus. Symptoms vary from person to person. They may include:  Runny nose.  Sneezing.  Nasal congestion.  Sinus irritation.  Sore throat.  Loss of voice (laryngitis).  Cough.  Fatigue.  Muscle aches.  Loss of appetite.  Headache.  Low-grade fever. DIAGNOSIS  You might diagnose your own cold based on familiar symptoms, since most people get a cold 2 to 3 times a year. Your caregiver can confirm this based on your exam. Most importantly, your caregiver can check that your symptoms are not due to another disease such as strep throat, sinusitis, pneumonia, asthma, or epiglottitis. Blood tests, throat tests, and X-rays are not necessary to diagnose a common cold, but they may sometimes be helpful in excluding other more serious diseases. Your caregiver will decide if any further tests are required. RISKS AND COMPLICATIONS  You may be at risk for a more severe case of the common cold if you smoke cigarettes, have chronic heart disease (such as heart failure) or lung disease (such as asthma), or if  you have a weakened immune system. The very young and very old are also at risk for more serious infections. Bacterial sinusitis, middle ear infections, and bacterial pneumonia can complicate the common cold. The common cold can worsen asthma and chronic obstructive pulmonary disease (COPD). Sometimes, these complications can require emergency medical care and may be life-threatening. PREVENTION  The best way to protect against getting a cold is to practice good hygiene. Avoid oral or hand contact with people with cold symptoms. Wash your hands often if contact occurs. There is no clear evidence that vitamin C, vitamin E, echinacea, or exercise reduces the chance of developing a cold. However, it is always recommended to get plenty of rest and practice good nutrition. TREATMENT  Treatment is directed at relieving symptoms. There is no cure. Antibiotics are not effective, because the infection is caused by a virus, not by bacteria. Treatment may include:  Increased fluid intake. Sports drinks offer valuable electrolytes, sugars, and fluids.  Breathing heated mist or steam (vaporizer or shower).  Eating chicken soup or other clear broths, and maintaining good nutrition.  Getting plenty of rest.  Using gargles or lozenges for comfort.  Controlling fevers with ibuprofen or acetaminophen as directed by your caregiver.  Increasing usage of your inhaler if you have asthma. Zinc gel and zinc lozenges, taken in the first 24 hours of the common cold, can shorten the duration and lessen the severity of symptoms. Pain medicines may help with fever, muscle aches, and throat pain. A variety of non-prescription medicines are available to treat congestion and runny nose. Your caregiver  can make recommendations and may suggest nasal or lung inhalers for other symptoms.  HOME CARE INSTRUCTIONS   Only take over-the-counter or prescription medicines for pain, discomfort, or fever as directed by your  caregiver.  Use a warm mist humidifier or inhale steam from a shower to increase air moisture. This may keep secretions moist and make it easier to breathe.  Drink enough water and fluids to keep your urine clear or pale yellow.  Rest as needed.  Return to work when your temperature has returned to normal or as your caregiver advises. You may need to stay home longer to avoid infecting others. You can also use a face mask and careful hand washing to prevent spread of the virus. SEEK MEDICAL CARE IF:   After the first few days, you feel you are getting worse rather than better.  You need your caregiver's advice about medicines to control symptoms.  You develop chills, worsening shortness of breath, or brown or red sputum. These may be signs of pneumonia.  You develop yellow or brown nasal discharge or pain in the face, especially when you bend forward. These may be signs of sinusitis.  You develop a fever, swollen neck glands, pain with swallowing, or white areas in the back of your throat. These may be signs of strep throat. SEEK IMMEDIATE MEDICAL CARE IF:   You have a fever.  You develop severe or persistent headache, ear pain, sinus pain, or chest pain.  You develop wheezing, a prolonged cough, cough up blood, or have a change in your usual mucus (if you have chronic lung disease).  You develop sore muscles or a stiff neck. Document Released: 12/02/2000 Document Revised: 08/31/2011 Document Reviewed: 09/13/2013 [facility] [facility] Patient Information 2015 [place], [place]. This information is not intended to replace advice given to you by your health care provider. Make sure you discuss any questions you have with your health care provider.  Viral Infections A viral infection can be caused by different types of viruses.Most viral infections are not serious and resolve on their own. However, some infections may cause severe symptoms and may lead to further complications. SYMPTOMS Viruses can  frequently cause:  Minor sore throat.  Aches and pains.  Headaches.  Runny nose.  Different types of rashes.  Watery eyes.  Tiredness.  Cough.  Loss of appetite.  Gastrointestinal infections, resulting in nausea, vomiting, and diarrhea. These symptoms do not respond to antibiotics because the infection is not caused by bacteria. However, you might catch a bacterial infection following the viral infection. This is sometimes called a "superinfection." Symptoms of such a bacterial infection may include:  Worsening sore throat with pus and difficulty swallowing.  Swollen neck glands.  Chills and a high or persistent fever.  Severe headache.  Tenderness over the sinuses.  Persistent overall ill feeling (malaise), muscle aches, and tiredness (fatigue).  Persistent cough.  Yellow, green, or brown mucus production with coughing. HOME CARE INSTRUCTIONS   Only take over-the-counter or prescription medicines for pain, discomfort, diarrhea, or fever as directed by your caregiver.  Drink enough water and fluids to keep your urine clear or pale yellow. Sports drinks can provide valuable electrolytes, sugars, and hydration.  Get plenty of rest and maintain proper nutrition. Soups and broths with crackers or rice are fine. SEEK IMMEDIATE MEDICAL CARE IF:   You have severe headaches, shortness of breath, chest pain, neck pain, or an unusual rash.  You have uncontrolled vomiting, diarrhea, or you are unable to keep down fluids.  You or your child has an oral temperature above 102 F (38.9 C), not controlled by medicine.  Your baby is older than 3 months with a rectal temperature of 102 F (38.9 C) or higher.  Your baby is 35 months old or younger with a rectal temperature of 100.4 F (38 C) or higher. MAKE SURE YOU:   Understand these instructions.  Will watch your condition.  Will get help right away if you are not doing well or get worse. Document Released: 03/18/2005  Document Revised: 08/31/2011 Document Reviewed: 10/13/2010 [facility] Patient Information 2015 [place], [place]. This information is not intended to replace advice given to you by your health care provider. Make sure you discuss any questions you have with your health care provider.

## 2014-08-30 NOTE — ED Notes (Signed)
Pt verbalized understanding of d/c instructions and has no further questions. [facility] called. Pt has no ride home.

## 2014-08-30 NOTE — ED Provider Notes (Signed)
2:30 AM  Assumed care from Dr. Colin Rhein.  Pt here with fever, cough.  Likely viral illness.  Labs show no leukocytosis. Creatinine elevated but this is chronic. Urine shows no sign of infection. Chest x-ray shows no infiltrate or significant edema. CT of her abdomen and pelvis shows no acute abnormalities. We'll discharge home with outpatient follow-up.  Eads, DO 08/30/14 [phone]

## 2014-08-31 ENCOUNTER — Telehealth: Payer: Self-pay | Admitting: Internal Medicine

## 2014-08-31 DIAGNOSIS — E1040 Type 1 diabetes mellitus with diabetic neuropathy, unspecified: Secondary | ICD-10-CM

## 2014-08-31 DIAGNOSIS — R6251 Failure to thrive (child): Secondary | ICD-10-CM

## 2014-08-31 DIAGNOSIS — E1065 Type 1 diabetes mellitus with hyperglycemia: Secondary | ICD-10-CM

## 2014-08-31 DIAGNOSIS — IMO0002 Reserved for concepts with insufficient information to code with codable children: Secondary | ICD-10-CM

## 2014-08-31 DIAGNOSIS — I10 Essential (primary) hypertension: Secondary | ICD-10-CM

## 2014-08-31 NOTE — Telephone Encounter (Signed)
Patient states she was release from Hospital yesterday.  She states that the hospital told her she had infection but did not prescribed.  She is requesting if Dr. Jenny Reichmann could prescribe something.  She states she is coughing up dark mucus.

## 2014-08-31 NOTE — Telephone Encounter (Signed)
i would only take what was prescribed at your discharge. thanks

## 2014-09-02 ENCOUNTER — Encounter (HOSPITAL_COMMUNITY): Payer: Self-pay | Admitting: *Deleted

## 2014-09-02 ENCOUNTER — Emergency Department (HOSPITAL_COMMUNITY): Payer: Medicare Other

## 2014-09-02 ENCOUNTER — Observation Stay (HOSPITAL_COMMUNITY)
Admission: EM | Admit: 2014-09-02 | Discharge: 2014-09-05 | Disposition: A | Discharge status: Home / Self Care | Payer: Medicare Other | Attending: Internal Medicine | Admitting: Internal Medicine

## 2014-09-02 DIAGNOSIS — J449 Chronic obstructive pulmonary disease, unspecified: Secondary | ICD-10-CM | POA: Diagnosis not present

## 2014-09-02 DIAGNOSIS — N179 Acute kidney failure, unspecified: Secondary | ICD-10-CM | POA: Diagnosis not present

## 2014-09-02 DIAGNOSIS — E1041 Type 1 diabetes mellitus with diabetic mononeuropathy: Secondary | ICD-10-CM

## 2014-09-02 DIAGNOSIS — R531 Weakness: Secondary | ICD-10-CM

## 2014-09-02 DIAGNOSIS — J45909 Unspecified asthma, uncomplicated: Secondary | ICD-10-CM | POA: Diagnosis not present

## 2014-09-02 DIAGNOSIS — E1065 Type 1 diabetes mellitus with hyperglycemia: Secondary | ICD-10-CM | POA: Insufficient documentation

## 2014-09-02 DIAGNOSIS — Z9049 Acquired absence of other specified parts of digestive tract: Secondary | ICD-10-CM | POA: Diagnosis not present

## 2014-09-02 DIAGNOSIS — F329 Major depressive disorder, single episode, unspecified: Secondary | ICD-10-CM | POA: Diagnosis not present

## 2014-09-02 DIAGNOSIS — N183 Chronic kidney disease, stage 3 (moderate): Secondary | ICD-10-CM | POA: Insufficient documentation

## 2014-09-02 DIAGNOSIS — J310 Chronic rhinitis: Secondary | ICD-10-CM | POA: Insufficient documentation

## 2014-09-02 DIAGNOSIS — Z794 Long term (current) use of insulin: Secondary | ICD-10-CM | POA: Diagnosis not present

## 2014-09-02 DIAGNOSIS — E559 Vitamin D deficiency, unspecified: Secondary | ICD-10-CM | POA: Diagnosis not present

## 2014-09-02 DIAGNOSIS — G43909 Migraine, unspecified, not intractable, without status migrainosus: Secondary | ICD-10-CM | POA: Insufficient documentation

## 2014-09-02 DIAGNOSIS — Z880 Allergy status to penicillin: Secondary | ICD-10-CM | POA: Insufficient documentation

## 2014-09-02 DIAGNOSIS — IMO0002 Reserved for concepts with insufficient information to code with codable children: Secondary | ICD-10-CM

## 2014-09-02 DIAGNOSIS — E1049 Type 1 diabetes mellitus with other diabetic neurological complication: Secondary | ICD-10-CM

## 2014-09-02 DIAGNOSIS — K219 Gastro-esophageal reflux disease without esophagitis: Secondary | ICD-10-CM | POA: Insufficient documentation

## 2014-09-02 DIAGNOSIS — J209 Acute bronchitis, unspecified: Secondary | ICD-10-CM | POA: Diagnosis not present

## 2014-09-02 DIAGNOSIS — I517 Cardiomegaly: Secondary | ICD-10-CM | POA: Diagnosis not present

## 2014-09-02 DIAGNOSIS — F1099 Alcohol use, unspecified with unspecified alcohol-induced disorder: Secondary | ICD-10-CM | POA: Insufficient documentation

## 2014-09-02 DIAGNOSIS — J069 Acute upper respiratory infection, unspecified: Secondary | ICD-10-CM | POA: Diagnosis present

## 2014-09-02 DIAGNOSIS — Z6841 Body Mass Index (BMI) 40.0 and over, adult: Secondary | ICD-10-CM | POA: Diagnosis not present

## 2014-09-02 DIAGNOSIS — Z87891 Personal history of nicotine dependence: Secondary | ICD-10-CM | POA: Diagnosis not present

## 2014-09-02 DIAGNOSIS — M109 Gout, unspecified: Secondary | ICD-10-CM | POA: Insufficient documentation

## 2014-09-02 DIAGNOSIS — G4733 Obstructive sleep apnea (adult) (pediatric): Secondary | ICD-10-CM | POA: Insufficient documentation

## 2014-09-02 DIAGNOSIS — R0602 Shortness of breath: Secondary | ICD-10-CM | POA: Diagnosis present

## 2014-09-02 DIAGNOSIS — Z7951 Long term (current) use of inhaled steroids: Secondary | ICD-10-CM | POA: Insufficient documentation

## 2014-09-02 DIAGNOSIS — Z96652 Presence of left artificial knee joint: Secondary | ICD-10-CM | POA: Diagnosis not present

## 2014-09-02 DIAGNOSIS — J440 Chronic obstructive pulmonary disease with acute lower respiratory infection: Principal | ICD-10-CM | POA: Insufficient documentation

## 2014-09-02 DIAGNOSIS — E1040 Type 1 diabetes mellitus with diabetic neuropathy, unspecified: Secondary | ICD-10-CM | POA: Insufficient documentation

## 2014-09-02 DIAGNOSIS — Z90710 Acquired absence of both cervix and uterus: Secondary | ICD-10-CM | POA: Diagnosis not present

## 2014-09-02 DIAGNOSIS — Z888 Allergy status to other drugs, medicaments and biological substances status: Secondary | ICD-10-CM | POA: Diagnosis not present

## 2014-09-02 DIAGNOSIS — I129 Hypertensive chronic kidney disease with stage 1 through stage 4 chronic kidney disease, or unspecified chronic kidney disease: Secondary | ICD-10-CM | POA: Insufficient documentation

## 2014-09-02 DIAGNOSIS — Z7982 Long term (current) use of aspirin: Secondary | ICD-10-CM | POA: Diagnosis not present

## 2014-09-02 DIAGNOSIS — R9431 Abnormal electrocardiogram [ECG] [EKG]: Secondary | ICD-10-CM | POA: Diagnosis not present

## 2014-09-02 DIAGNOSIS — I10 Essential (primary) hypertension: Secondary | ICD-10-CM | POA: Diagnosis present

## 2014-09-02 HISTORY — DX: Family history of other specified conditions: Z84.89

## 2014-09-02 HISTORY — DX: Adverse effect of unspecified anesthetic, initial encounter: T41.45XA

## 2014-09-02 HISTORY — DX: Obstructive sleep apnea (adult) (pediatric): G47.33

## 2014-09-02 HISTORY — DX: Type 2 diabetes mellitus with diabetic polyneuropathy: E11.42

## 2014-09-02 HISTORY — DX: Personal history of other diseases of the digestive system: Z87.19

## 2014-09-02 HISTORY — DX: Migraine, unspecified, not intractable, without status migrainosus: G43.909

## 2014-09-02 HISTORY — DX: Type 2 diabetes mellitus without complications: E11.9

## 2014-09-02 HISTORY — DX: Other chronic pain: G89.29

## 2014-09-02 HISTORY — DX: Cardiac murmur, unspecified: R01.1

## 2014-09-02 HISTORY — DX: Other complications of anesthesia, initial encounter: T88.59XA

## 2014-09-02 HISTORY — DX: Unspecified osteoarthritis, unspecified site: M19.90

## 2014-09-02 HISTORY — DX: Dorsalgia, unspecified: M54.9

## 2014-09-02 LAB — COMPREHENSIVE METABOLIC PANEL
ALK PHOS: 53 U/L (ref 39–117)
ALT: 16 U/L (ref 0–35)
AST: 17 U/L (ref 0–37)
Albumin: 2.8 g/dL — ABNORMAL LOW (ref 3.5–5.2)
Anion gap: 8 (ref 5–15)
BILIRUBIN TOTAL: 0.2 mg/dL — AB (ref 0.3–1.2)
BUN: 20 mg/dL (ref 6–23)
CO2: 30 mmol/L (ref 19–32)
Calcium: 9 mg/dL (ref 8.4–10.5)
Chloride: 98 mmol/L (ref 96–112)
Creatinine, Ser: 1.52 mg/dL — ABNORMAL HIGH (ref 0.50–1.10)
GFR calc non Af Amer: 35 mL/min — ABNORMAL LOW (ref >90)
GFR, EST AFRICAN AMERICAN: 41 mL/min — AB (ref >90)
Glucose, Bld: 229 mg/dL — ABNORMAL HIGH (ref 70–99)
POTASSIUM: 4.3 mmol/L (ref 3.5–5.1)
SODIUM: 136 mmol/L (ref 135–145)
TOTAL PROTEIN: 6.7 g/dL (ref 6.0–8.3)

## 2014-09-02 LAB — CBC WITH DIFFERENTIAL/PLATELET
BASOS ABS: 0 10*3/uL (ref 0.0–0.1)
Basophils Relative: 1 % (ref 0–1)
EOS PCT: 2 % (ref 0–5)
Eosinophils Absolute: 0.1 10*3/uL (ref 0.0–0.7)
HCT: 35.1 % — ABNORMAL LOW (ref 36.0–46.0)
Hemoglobin: 11 g/dL — ABNORMAL LOW (ref 12.0–15.0)
LYMPHS ABS: 1.7 10*3/uL (ref 0.7–4.0)
LYMPHS PCT: 36 % (ref 12–46)
MCH: 26.4 pg (ref 26.0–34.0)
MCHC: 31.3 g/dL (ref 30.0–36.0)
MCV: 84.4 fL (ref 78.0–100.0)
Monocytes Absolute: 0.5 10*3/uL (ref 0.1–1.0)
Monocytes Relative: 10 % (ref 3–12)
Neutro Abs: 2.5 10*3/uL (ref 1.7–7.7)
Neutrophils Relative %: 51 % (ref 43–77)
Platelets: 150 10*3/uL (ref 150–400)
RBC: 4.16 MIL/uL (ref 3.87–5.11)
RDW: 16.1 % — ABNORMAL HIGH (ref 11.5–15.5)
WBC: 4.8 10*3/uL (ref 4.0–10.5)

## 2014-09-02 LAB — MRSA PCR SCREENING: MRSA by PCR: NEGATIVE

## 2014-09-02 LAB — GLUCOSE, CAPILLARY
GLUCOSE-CAPILLARY: 326 mg/dL — AB (ref 70–99)
GLUCOSE-CAPILLARY: 426 mg/dL — AB (ref 70–99)
Glucose-Capillary: 364 mg/dL — ABNORMAL HIGH (ref 70–99)
Glucose-Capillary: 390 mg/dL — ABNORMAL HIGH (ref 70–99)

## 2014-09-02 LAB — TROPONIN I: Troponin I: <0.03 ng/mL (ref <0.031)

## 2014-09-02 LAB — BRAIN NATRIURETIC PEPTIDE: B Natriuretic Peptide: 79.3 pg/mL (ref 0.0–100.0)

## 2014-09-02 MED ORDER — INSULIN ASPART 100 UNIT/ML ~~LOC~~ SOLN
15.0000 [IU] | Freq: Once | SUBCUTANEOUS | Status: AC
  Administered 2014-09-02: 15 [IU] via SUBCUTANEOUS

## 2014-09-02 MED ORDER — FAMOTIDINE 20 MG PO TABS
20.0000 mg | ORAL_TABLET | Freq: Every day | ORAL | Status: DC
  Administered 2014-09-02 – 2014-09-04 (×3): 20 mg via ORAL
  Filled 2014-09-02 (×3): qty 1

## 2014-09-02 MED ORDER — METHYLPREDNISOLONE SODIUM SUCC 125 MG IJ SOLR
60.0000 mg | Freq: Three times a day (TID) | INTRAMUSCULAR | Status: DC
  Administered 2014-09-02 – 2014-09-03 (×3): 60 mg via INTRAVENOUS
  Filled 2014-09-02 (×3): qty 2

## 2014-09-02 MED ORDER — GUAIFENESIN ER 600 MG PO TB12
600.0000 mg | ORAL_TABLET | Freq: Two times a day (BID) | ORAL | Status: DC
  Administered 2014-09-02 – 2014-09-05 (×7): 600 mg via ORAL
  Filled 2014-09-02 (×7): qty 1

## 2014-09-02 MED ORDER — LORATADINE 10 MG PO TABS
10.0000 mg | ORAL_TABLET | Freq: Every day | ORAL | Status: DC
  Administered 2014-09-02 – 2014-09-05 (×4): 10 mg via ORAL
  Filled 2014-09-02 (×4): qty 1

## 2014-09-02 MED ORDER — PROMETHAZINE HCL 25 MG PO TABS: 25.0000 mg | ORAL_TABLET | Freq: Four times a day (QID) | ORAL | Status: DC | PRN

## 2014-09-02 MED ORDER — INSULIN ASPART 100 UNIT/ML ~~LOC~~ SOLN
0.0000 [IU] | Freq: Three times a day (TID) | SUBCUTANEOUS | Status: DC
  Administered 2014-09-02 (×2): 15 [IU] via SUBCUTANEOUS
  Administered 2014-09-03: 0 [IU] via SUBCUTANEOUS

## 2014-09-02 MED ORDER — MONTELUKAST SODIUM 10 MG PO TABS
10.0000 mg | ORAL_TABLET | Freq: Every day | ORAL | Status: DC
  Administered 2014-09-02 – 2014-09-04 (×3): 10 mg via ORAL
  Filled 2014-09-02 (×3): qty 1

## 2014-09-02 MED ORDER — DICLOFENAC SODIUM 1 % TD GEL
4.0000 g | Freq: Four times a day (QID) | TRANSDERMAL | Status: DC | PRN
  Filled 2014-09-02: qty 100

## 2014-09-02 MED ORDER — ALBUTEROL SULFATE (2.5 MG/3ML) 0.083% IN NEBU: 2.5000 mg | INHALATION_SOLUTION | RESPIRATORY_TRACT | Status: DC | PRN

## 2014-09-02 MED ORDER — ONDANSETRON HCL 4 MG/2ML IJ SOLN: 4.0000 mg | Freq: Four times a day (QID) | INTRAMUSCULAR | Status: DC | PRN

## 2014-09-02 MED ORDER — FLUTICASONE PROPIONATE 50 MCG/ACT NA SUSP
2.0000 | Freq: Every morning | NASAL | Status: DC
  Administered 2014-09-02 – 2014-09-05 (×4): 2 via NASAL
  Filled 2014-09-02 (×2): qty 16

## 2014-09-02 MED ORDER — GABAPENTIN 400 MG PO CAPS
800.0000 mg | ORAL_CAPSULE | Freq: Three times a day (TID) | ORAL | Status: DC
  Administered 2014-09-02 – 2014-09-05 (×9): 800 mg via ORAL
  Filled 2014-09-02 (×9): qty 2

## 2014-09-02 MED ORDER — ALBUTEROL SULFATE (2.5 MG/3ML) 0.083% IN NEBU
5.0000 mg | INHALATION_SOLUTION | Freq: Once | RESPIRATORY_TRACT | Status: AC
  Administered 2014-09-02: 5 mg via RESPIRATORY_TRACT

## 2014-09-02 MED ORDER — ONDANSETRON HCL 4 MG PO TABS
4.0000 mg | ORAL_TABLET | Freq: Four times a day (QID) | ORAL | Status: DC | PRN
Start: 2014-09-02 — End: 2014-09-05

## 2014-09-02 MED ORDER — ALBUTEROL SULFATE (2.5 MG/3ML) 0.083% IN NEBU
INHALATION_SOLUTION | RESPIRATORY_TRACT | Status: AC
  Administered 2014-09-02: 2.5 mg via RESPIRATORY_TRACT
  Filled 2014-09-02: qty 6

## 2014-09-02 MED ORDER — ALBUTEROL (5 MG/ML) CONTINUOUS INHALATION SOLN
10.0000 mg/h | INHALATION_SOLUTION | Freq: Once | RESPIRATORY_TRACT | Status: AC
  Administered 2014-09-02: 10 mg/h via RESPIRATORY_TRACT
  Filled 2014-09-02: qty 20

## 2014-09-02 MED ORDER — HYDROCODONE-HOMATROPINE 5-1.5 MG/5ML PO SYRP
5.0000 mL | ORAL_SOLUTION | Freq: Four times a day (QID) | ORAL | Status: DC | PRN
  Administered 2014-09-02 – 2014-09-05 (×6): 5 mL via ORAL
  Filled 2014-09-02 (×6): qty 5

## 2014-09-02 MED ORDER — CYCLOBENZAPRINE HCL 5 MG PO TABS
5.0000 mg | ORAL_TABLET | Freq: Three times a day (TID) | ORAL | Status: DC | PRN
  Administered 2014-09-02 – 2014-09-05 (×7): 5 mg via ORAL
  Filled 2014-09-02 (×8): qty 1

## 2014-09-02 MED ORDER — ASPIRIN 81 MG PO CHEW
81.0000 mg | CHEWABLE_TABLET | Freq: Every day | ORAL | Status: DC
  Administered 2014-09-02 – 2014-09-04 (×3): 81 mg via ORAL
  Filled 2014-09-02 (×3): qty 1

## 2014-09-02 MED ORDER — ACETAMINOPHEN 325 MG PO TABS
650.0000 mg | ORAL_TABLET | Freq: Four times a day (QID) | ORAL | Status: DC | PRN
  Administered 2014-09-02: 650 mg via ORAL
  Filled 2014-09-02 (×2): qty 2

## 2014-09-02 MED ORDER — ACETAMINOPHEN 650 MG RE SUPP: 650.0000 mg | Freq: Four times a day (QID) | RECTAL | Status: DC | PRN

## 2014-09-02 MED ORDER — OXYBUTYNIN CHLORIDE ER 10 MG PO TB24
10.0000 mg | ORAL_TABLET | Freq: Every day | ORAL | Status: DC
Start: 2014-09-02 — End: 2014-09-05
  Administered 2014-09-02 – 2014-09-05 (×4): 10 mg via ORAL
  Filled 2014-09-02 (×4): qty 1

## 2014-09-02 MED ORDER — INSULIN ASPART 100 UNIT/ML ~~LOC~~ SOLN
12.0000 [IU] | Freq: Three times a day (TID) | SUBCUTANEOUS | Status: DC
  Administered 2014-09-02 – 2014-09-03 (×2): 12 [IU] via SUBCUTANEOUS

## 2014-09-02 MED ORDER — LEVOFLOXACIN IN D5W 750 MG/150ML IV SOLN
750.0000 mg | INTRAVENOUS | Status: DC
  Administered 2014-09-02 – 2014-09-03 (×2): 750 mg via INTRAVENOUS
  Filled 2014-09-02 (×3): qty 150

## 2014-09-02 MED ORDER — METOPROLOL TARTRATE 50 MG PO TABS
75.0000 mg | ORAL_TABLET | Freq: Two times a day (BID) | ORAL | Status: DC
  Administered 2014-09-02 – 2014-09-05 (×7): 75 mg via ORAL
  Filled 2014-09-02 (×13): qty 1

## 2014-09-02 MED ORDER — INSULIN GLARGINE 100 UNIT/ML ~~LOC~~ SOLN
100.0000 [IU] | Freq: Every day | SUBCUTANEOUS | Status: DC
  Administered 2014-09-02 – 2014-09-03 (×2): 100 [IU] via SUBCUTANEOUS
  Filled 2014-09-02 (×2): qty 1

## 2014-09-02 MED ORDER — HEPARIN SODIUM (PORCINE) 5000 UNIT/ML IJ SOLN
5000.0000 [IU] | Freq: Three times a day (TID) | INTRAMUSCULAR | Status: DC
  Administered 2014-09-02 – 2014-09-05 (×11): 5000 [IU] via SUBCUTANEOUS
  Filled 2014-09-02 (×6): qty 1

## 2014-09-02 MED ORDER — METHYLPREDNISOLONE SODIUM SUCC 125 MG IJ SOLR
125.0000 mg | Freq: Once | INTRAMUSCULAR | Status: AC
Start: 2014-09-02 — End: 2014-09-02
  Administered 2014-09-02: 125 mg via INTRAVENOUS

## 2014-09-02 MED ORDER — INSULIN ASPART 100 UNIT/ML ~~LOC~~ SOLN
0.0000 [IU] | Freq: Every day | SUBCUTANEOUS | Status: DC
  Administered 2014-09-02: 5 [IU] via SUBCUTANEOUS

## 2014-09-02 MED ORDER — ALBUTEROL SULFATE (2.5 MG/3ML) 0.083% IN NEBU
2.5000 mg | INHALATION_SOLUTION | RESPIRATORY_TRACT | Status: DC
  Administered 2014-09-02 – 2014-09-05 (×15): 2.5 mg via RESPIRATORY_TRACT
  Filled 2014-09-02 (×17): qty 3

## 2014-09-02 MED ORDER — INSULIN GLARGINE 100 UNIT/ML SOLOSTAR PEN: 100.0000 [IU] | PEN_INJECTOR | SUBCUTANEOUS | Status: DC

## 2014-09-02 MED ORDER — VITAMIN D 1000 UNITS PO TABS
2000.0000 [IU] | ORAL_TABLET | Freq: Every morning | ORAL | Status: DC
  Administered 2014-09-02 – 2014-09-05 (×4): 2000 [IU] via ORAL
  Filled 2014-09-02 (×4): qty 2

## 2014-09-02 MED ORDER — HYDRALAZINE HCL 20 MG/ML IJ SOLN: 10.0000 mg | Freq: Four times a day (QID) | INTRAMUSCULAR | Status: DC | PRN

## 2014-09-02 MED ORDER — ASPIRIN 81 MG PO TABS: 81.0000 mg | ORAL_TABLET | Freq: Every day | ORAL | Status: DC

## 2014-09-02 MED ORDER — METHYLPREDNISOLONE SODIUM SUCC 125 MG IJ SOLR
INTRAMUSCULAR | Status: AC
Start: 2014-09-02 — End: 2014-09-02
  Filled 2014-09-02: qty 2

## 2014-09-02 MED ORDER — SODIUM CHLORIDE 0.9 % IV SOLN
INTRAVENOUS | Status: DC
  Administered 2014-09-02 – 2014-09-03 (×2): via INTRAVENOUS

## 2014-09-02 MED ORDER — PANTOPRAZOLE SODIUM 40 MG PO TBEC
40.0000 mg | DELAYED_RELEASE_TABLET | Freq: Two times a day (BID) | ORAL | Status: DC
  Administered 2014-09-02 – 2014-09-05 (×7): 40 mg via ORAL
  Filled 2014-09-02 (×7): qty 1

## 2014-09-02 MED ORDER — GUAIFENESIN 100 MG/5ML PO SYRP
200.0000 mg | ORAL_SOLUTION | ORAL | Status: DC | PRN
  Filled 2014-09-02: qty 10

## 2014-09-02 MED ORDER — GABAPENTIN 800 MG PO TABS
800.0000 mg | ORAL_TABLET | Freq: Three times a day (TID) | ORAL | Status: DC
  Filled 2014-09-02 (×3): qty 1

## 2014-09-02 NOTE — H&P (Signed)
[facility] History and Physical  Christy May YOV:[id number] DOB: [birth year] DOA: 09/02/2014  Referring physician: Dr. Kathrynn Humble PCP: Cathlean Cower, MD   Chief Complaint:  Cough with shortness of breath and generalized weakness for  2 weeks  HPI:  64 year old morbidly obese female with type I uncontrolled diabetes mellitus neuropathy, chronic rhinitis , GERD, vocal cord dysfunction ( following with outpt speech therapy), essential hypertension who presented to the ED with productive cough and shortness of breath with generalized weakness for past 2 weeks. Patient saw Dr. Lamonte Sakai 1 months back for chronic cough and was treated with a course of z pak during that time. She came to the ED 5 days back with similar symptoms . At that time she had cough with whitish phlegm and weakness. She was discharged home but did not feel better and continued to have cough now with greyish  sputum and occasionally blood-tinged. She reports dyspnea on exertion and wheezing along with generalized weakness and sweats.. Patient denies headache, dizziness, nausea , vomiting, chest pain, palpitations,  abdominal pain, bowel or urinary symptoms. Reports poor appetite. Denies weight loss.  Course in the ED Patient was afebrile. Remaining vitals were stable except for drop in her O2 sat to 88% while ambulating. She was found to be wheezy and given 125 mg IV Solu-Medrol and continuous albuterol nebulizer. Chest x-ray done suspicious for vascular congestion.  Blood work done showed a normal WBC, hemoglobin of 11 and platelets of 150. Chemistry showed sodium of 136, kill 4.3, BUN of 20 and creatinine 1.52 (elevated from baseline of 1.3). Blood glucose was 229. After treatment patient was attempted to ambulate but her O2 sat dropped to 87% on RA and hospitalist admission requested for 24 hr observation.  Review of Systems:  Constitutional: Denies fever, chills, diaphoresis, appetite change and fatigue.  HEENT:  congestion, rhinorrhea, Denies photophobia, eye pain, redness, hearing loss, ear pain,  sore throat,  sneezing, mouth sores, trouble swallowing, neck pain, neck stiffness and tinnitus.   Respiratory:  SOB, DOE, cough, wheezing.  , denies chest tightness Cardiovascular: Denies chest pain, palpitations and leg swelling.  Gastrointestinal: Denies nausea, vomiting, abdominal pain, diarrhea, constipation, blood in stool and abdominal distention.  Genitourinary: Denies dysuria, hematuria, flank pain and difficulty urinating.  Endocrine: Denies: hot or cold intolerance,polyuria, polydipsia. Musculoskeletal: Denies myalgias, back pain, joint pain Skin: Denies rash and wound.  Neurological: Generalized weakness, Denies dizziness, seizures, syncope,  light-headedness, numbness and headaches.  Hematological: Denies adenopathy.  Psychiatric/Behavioral: Denies confusion  Past Medical History  Diagnosis Date  . Anemia, unspecified   . Extrinsic asthma, unspecified   . Chronic airway obstruction, not elsewhere classified   . Spondylosis of unspecified site without mention of myelopathy   . Dehydration   . Depressive disorder, not elsewhere classified   . Type II or unspecified type diabetes mellitus without mention of complication, not stated as uncontrolled   . Esophageal reflux   . Unspecified essential hypertension   . Unspecified menopausal and postmenopausal disorder   . Obesity, unspecified   . Inflammatory and toxic neuropathy, unspecified   . Unspecified sleep apnea   . Unspecified vitamin D deficiency   . Dysphagia 2007    historyof dysphagia with severe dysmotility by barium swallow-[name]  . Obesity   . Hypertension   . Diabetes mellitus without complication   . Allergic bronchitis   . Sleep apnea   . Gout 01/04/2013   Past Surgical History  Procedure Laterality Date  . Laser sugery  bilateral eyes  . Back surgery      fusion-multiple cervical spine levels-Dr. Arnoldo Morale   . Excision of abcess      ?? Chest  . Dilation and curettage of uterus    . Exploratory laparotomy    . Partial hysterectomy    . Total knee arthroplasty  2005    Left  . Total knee arthroplasty  2005    Right- [name]  . Foot surgery      Right X 2  . Cholecystectomy    . Appendectomy    . Tonsillectomy    . Abdominal hysterectomy    . Esophagogastroduodenoscopy N/A 05/08/2014    Procedure: ESOPHAGOGASTRODUODENOSCOPY (EGD);  Surgeon: [name], MD;  Location: [name];  Service: Endoscopy;  Laterality: N/A;  . Colonoscopy N/A 05/08/2014    Procedure: COLONOSCOPY;  Surgeon: [name], MD;  Location: [facility];  Service: Endoscopy;  Laterality: N/A;   Social History:  reports that she quit smoking about 28 years ago. She has never used smokeless tobacco. She reports that she does not drink alcohol or use illicit drugs.  Allergies  Allergen Reactions  . Ace Inhibitors Anaphylaxis  . Penicillins Hives and Rash  . Tape Hives    Paper tape is ok to use  . Adhesive [Tape] Rash    Family History  Problem Relation Age of Onset  . Esophageal cancer Brother   . Esophageal cancer Sister     ?  . Colon polyps Brother   . Pancreatic cancer Sister     ?  . Diabetes Sister   . Diabetes      Aunt and Uncle  . Heart disease Maternal Grandfather     Prior to Admission medications   Medication Sig Start Date End Date Taking? Authorizing Provider  albuterol (PROVENTIL) (2.5 MG/3ML) 0.083% nebulizer solution Take 2.5 mg by nebulization every 6 (six) hours as needed for wheezing or shortness of breath.    Historical Provider, MD  aspirin 81 MG tablet Take 81 mg by mouth at bedtime.     Historical Provider, MD  azithromycin (ZITHROMAX Z-PAK) 250 MG tablet Use as directed 05/31/14   Biagio Borg, MD  azithromycin (ZITHROMAX Z-PAK) 250 MG tablet Take 2 today, then 1 daily until gone. 08/09/14   Collene Gobble, MD  benzonatate (TESSALON) 100 MG capsule Take 1 capsule (100  mg total) by mouth 3 (three) times daily as needed for cough. 08/30/14   Kristen N Ward, DO  cholecalciferol (VITAMIN D) 1000 UNITS tablet Take 2,000 Units by mouth every morning.     Historical Provider, MD  cyclobenzaprine (FLEXERIL) 5 MG tablet TAKE ONE TABLET BY MOUTH THREE TIMES DAILY AS NEEDED FOR MUSCLE SPASMS 07/24/14   Biagio Borg, MD  cyclobenzaprine (FLEXERIL) 5 MG tablet TAKE 1 TABLET BY MOUTH THREE TIMES DAILY AS NEEDED FOR MUSCLE SPASMS 08/21/14   Biagio Borg, MD  dextromethorphan (DELSYM) 30 MG/5ML liquid Take 30 mg by mouth 3 (three) times daily as needed for cough.    Historical Provider, MD  diclofenac sodium (VOLTAREN) 1 % GEL Apply 4 g topically 4 (four) times daily as needed (right hand, arm and neck pain.).    Historical Provider, MD  esomeprazole (NEXIUM) 40 MG capsule Take 1 capsule (40 mg total) by mouth 2 (two) times daily. 03/13/14   Collene Gobble, MD  famotidine (PEPCID) 20 MG tablet TAKE 1 TABLET BY MOUTH EVERY NIGHT AT BEDTIME 08/21/14   Herbie Baltimore  Agustina Caroli, MD  fluticasone (FLONASE) 50 MCG/ACT nasal spray Place 2 sprays into both nostrils every morning. 04/26/14   Collene Gobble, MD  gabapentin (NEURONTIN) 800 MG tablet Take 800 mg by mouth 3 (three) times daily.    Historical Provider, MD  gabapentin (NEURONTIN) 800 MG tablet TAKE 1 TABLET BY MOUTH THREE TIMES DAILY 06/27/14   Biagio Borg, MD  glucose blood (ONE TOUCH ULTRA TEST) test strip Use to test blood sugars twice a day. Dx 250.63 10/06/13   Renato Shin, MD  HYDROcodone-homatropine [facility]) 5-1.5 MG/5ML syrup Take 5 mLs by mouth every 6 (six) hours as needed for cough. 06/20/14   Collene Gobble, MD  HYDROcodone-homatropine [facility]) 5-1.5 MG/5ML syrup Take 5 mLs by mouth every 6 (six) hours as needed for cough. 08/09/14   Collene Gobble, MD  Insulin Glargine (Christy May) 100 UNIT/ML Solostar Pen Inject 270 Units into the skin every morning. And pen needles 2/day 07/25/14   Renato Shin, MD  insulin lispro (HUMALOG KWIKPEN) 100  UNIT/ML KiwkPen Inject 0.3 mLs (30 Units total) into the skin daily with supper. 07/25/14   Renato Shin, MD  irbesartan (AVAPRO) 300 MG tablet Take 300 mg by mouth every morning.     Historical Provider, MD  irbesartan (AVAPRO) 300 MG tablet TAKE 1 TABLET BY MOUTH DAILY 08/21/14   Biagio Borg, MD  Lancets [facility] ULTRASOFT) lancets Use to test blood sugars twice a day. 07/20/13   Renato Shin, MD  loratadine (CLARITIN) 10 MG tablet Take 10 mg by mouth every morning.    Historical Provider, MD  loratadine (CLARITIN) 10 MG tablet TAKE 1 TABLET BY MOUTH EVERY DAY 07/26/14   Collene Gobble, MD  metoprolol (LOPRESSOR) 50 MG tablet Take 75 mg by mouth 2 (two) times daily.    Historical Provider, MD  montelukast (SINGULAIR) 10 MG tablet Take 10 mg by mouth at bedtime.    Historical Provider, MD  nystatin cream (MYCOSTATIN) Apply to affected area 2 times daily 12/06/13   Biagio Borg, MD  ondansetron (ZOFRAN ODT) 4 MG disintegrating tablet Take 1 tablet (4 mg total) by mouth every 8 (eight) hours as needed for nausea or vomiting. 08/30/14   Kristen N Ward, DO  oxybutynin (DITROPAN-XL) 10 MG 24 hr tablet Take 10 mg by mouth every morning.     Historical Provider, MD  oxybutynin (DITROPAN-XL) 10 MG 24 hr tablet TAKE 1 TABLET BY MOUTH DAILY 08/13/14   Biagio Borg, MD  promethazine (PHENERGAN) 25 MG tablet TAKE 1 TABLET BY MOUTH EVERY 6 HOURS AS NEEDED FOR NAUSEA AND VOMITING 08/21/14   Biagio Borg, MD     Physical Exam:  Filed Vitals:   09/02/14 1062 09/02/14 0715 09/02/14 0737 09/02/14 0801  BP:  132/60 138/81   Pulse:  90 94   Temp: 98.1 F (36.7 C)  97.9 F (36.6 C)   TempSrc: Oral  Oral   Resp:  19 14   Height:      Weight:      SpO2:  95% 98% 97%    Constitutional: Vital signs reviewed.  Morbidly obese female lying in bed in no acute distress HEENT: no pallor, no icterus, moist oral mucosa, no cervical lymphadenopathy Cardiovascular: RRR, S1 normal, S2 normal, no MRG Chest: Diffuse rhonchi and  wheeze bilaterally, no crackles Abdominal: Soft. Non-tender, non-distended, bowel sounds are normal,  Ext: warm, no edema Neurological: A&O x3, non focal  Labs on Admission:  Basic Metabolic Panel:  Recent Labs Lab 08/29/14 2244 09/02/14 0610  NA 132* 136  K 4.2 4.3  CL 99 98  CO2 25 30  GLUCOSE 298* 229*  BUN 21 20  CREATININE 1.66* 1.52*  CALCIUM 8.6 9.0   Liver Function Tests:  Recent Labs Lab 08/29/14 2244 09/02/14 0610  AST 20 17  ALT 18 16  ALKPHOS 53 53  BILITOT 0.4 0.2*  PROT 6.8 6.7  ALBUMIN 3.1* 2.8*   No results for input(s): LIPASE, AMYLASE in the last 168 hours. No results for input(s): AMMONIA in the last 168 hours. CBC:  Recent Labs Lab 08/29/14 2244 09/02/14 0610  WBC 6.7 4.8  NEUTROABS 4.6 2.5  HGB 11.3* 11.0*  HCT 35.9* 35.1*  MCV 84.1 84.4  PLT 137* 150   Cardiac Enzymes:  Recent Labs Lab 09/02/14 0610  TROPONINI <0.03   BNP: Invalid input(s): POCBNP CBG: No results for input(s): GLUCAP in the last 168 hours.  Radiological Exams on Admission: Dg Chest 2 View  09/02/2014   CLINICAL DATA:  Weakness.  EXAM: CHEST  2 VIEW  COMPARISON:  08/29/2014  FINDINGS: The heart is enlarged, unchanged basilar assessment is limited due to soft tissue attenuation from body habitus. Central vascular congestion is suspected. No evidence of confluent airspace disease. No large pleural effusion.  IMPRESSION: Cardiomegaly.  Suspect vascular congestion.   Electronically Signed   By: Jeb Levering M.D.   On: 09/02/2014 05:50    EKG: Normal sinus rhythm @ 90, no ST-T changes  Assessment/Plan  Principal Problem:   Acute bronchitis with reactive airway disease Admit under observation. -Hypoxia possibly contributed to by underlying bronchitis. Continue O2 via nasal cannula. Ill place her  on IV Solu-Medrol 60 mg every 8 hours, albuterol nebs every 4 hours and every 2 hours as needed. Place on empiric Levaquin. Supportive care with Tylenol,  antitussives. Continue twice a day PPI. Resume Flonase and Singulair for chronic allergic rhinitis. PFT done in 2015 with minimal evidence for asthma. Follows with speech therapy for vocal cord dysfn . -Patient saw pulmonologist Dr. Lamonte Sakai recently for ongoing cough.  Active Problems: Chronic GERD Has chronic reflux symptoms. Continue twice a day PPI  Type 1 Diabetes mellitus, uncontrolled with peripheral neuropathy Resume home dose of Christy May (was recently reduced to 100 units daily by her endocrinologist Dr. Loanne Drilling),  Add pre meal aspart 10 units 3 times a day ( on 30 units lispro pre supper at home). Monitor on resistance sliding-scale insulin. Last A1c of 8.8. Continue gabapentin.     Essential hypertension Will hold irbesartan given mild acute kidney injury. We'll place on when necessary hydralazine   Generalized weakness Likely contributed by ongoing illness. PT evaluation ordered  Morbid obesity Counseled on diet and exercise to lose weight.        Diet:diabetic  DVT prophylaxis: sq heparin   Code Status: full code Family Communication:  None at bedside Disposition Plan: admit under observation for 24 hrs  Maison Kestenbaum, [place] [facility] Pager [phone]  Total time spent on admission :50 minutes  If 7PM-7AM, please contact night-coverage [email or web address] Password [facility] 09/02/2014, 8:39 AM

## 2014-09-02 NOTE — Progress Notes (Signed)
ANTIBIOTIC CONSULT NOTE - INITIAL  Pharmacy Consult for levaquin Indication: acute bronchitis  Allergies  Allergen Reactions  . Ace Inhibitors Anaphylaxis  . Penicillins Hives and Rash  . Tape Hives    Paper tape is ok to use  . Adhesive [Tape] Rash    Patient Measurements: Height: 5\' 4"  (162.6 cm) Weight: (!) 330 lb (149.687 kg) IBW/kg (Calculated) : 54.7 Adjusted Body Weight:   Vital Signs: Temp: 98.3 F (36.8 C) (03/13 0930) Temp Source: Oral (03/13 0930) BP: 125/63 mmHg (03/13 0930) Pulse Rate: 84 (03/13 0930) Intake/Output from previous day:   Intake/Output from this shift:    Labs:  Recent Labs  09/02/14 0610  WBC 4.8  HGB 11.0*  PLT 150  CREATININE 1.52*   Estimated Creatinine Clearance: 54.7 mL/min (by C-G formula based on Cr of 1.52). No results for input(s): VANCOTROUGH, VANCOPEAK, VANCORANDOM, GENTTROUGH, GENTPEAK, GENTRANDOM, TOBRATROUGH, TOBRAPEAK, TOBRARND, AMIKACINPEAK, AMIKACINTROU, AMIKACIN in the last 72 hours.   Microbiology: No results found for this or any previous visit (from the past 720 hour(s)).  Medical History: Past Medical History  Diagnosis Date  . Anemia, unspecified   . Extrinsic asthma, unspecified   . Chronic airway obstruction, not elsewhere classified   . Spondylosis of unspecified site without mention of myelopathy   . Dehydration   . Depressive disorder, not elsewhere classified   . Type II or unspecified type diabetes mellitus without mention of complication, not stated as uncontrolled   . Esophageal reflux   . Unspecified essential hypertension   . Unspecified menopausal and postmenopausal disorder   . Obesity, unspecified   . Inflammatory and toxic neuropathy, unspecified   . Unspecified sleep apnea   . Unspecified vitamin D deficiency   . Dysphagia 2007    historyof dysphagia with severe dysmotility by barium swallow-[name]  . Obesity   . Hypertension   . Diabetes mellitus without complication   .  Allergic bronchitis   . Sleep apnea   . Gout 01/04/2013    Medications:  Anti-infectives    Start     Dose/Rate Route Frequency Ordered Stop   09/02/14 1030  levofloxacin (LEVAQUIN) IVPB 750 mg     750 mg 100 mL/hr over 90 Minutes Intravenous Every 24 hours 09/02/14 8119       Assessment: 67 yof presented to the hospital with SOB and generalized weakness. To start empiric levaquin for treatment of acute bronchitis. Pt is afebrile and WBC is WNL. Scr is elevated at 1.52.  Levaquin 3/13>>  Goal of Therapy:  Eradication of infection  Plan:  - Levaquin 750mg  IV Q24H - F/u renal fxn, C&S, clinical status   Meriel Kelliher, Rande Lawman 09/02/2014,9:38 AM

## 2014-09-02 NOTE — ED Provider Notes (Signed)
CSN: [id number]     Arrival date & time 09/02/14  0029 History   First MD Initiated Contact with Patient 09/02/14 [phone]     Chief Complaint  Patient presents with  . Weakness     (Consider location/radiation/quality/duration/timing/severity/associated sxs/prior Treatment) HPI Patient presents with one week of generalized fatigue and weakness. She also notes cough that is gradually become more productive of grayish sputum. She's had increasing shortness of breath and wheezing with some chest tightness. Denies any fever or chills. Decreased appetite. Patient states prior to symptom onset she was on a fast. She denies any lower extremity swelling or pain. Patient was seen on 08/29/14 for similar symptoms. Workup essentially negative and diagnosed with URI. Past Medical History  Diagnosis Date  . Anemia, unspecified   . Dehydration   . Depressive disorder, not elsewhere classified   . Esophageal reflux   . Unspecified essential hypertension   . Unspecified menopausal and postmenopausal disorder   . Obesity, unspecified   . Inflammatory and toxic neuropathy, unspecified   . Unspecified vitamin D deficiency   . Dysphagia 2007    historyof dysphagia with severe dysmotility by barium swallow-[name]  . Obesity   . Hypertension   . Allergic bronchitis   . Complication of anesthesia     "I've had recall; I probably stopped breathing at least 2 times" (09/03/2014)  . Family history of adverse reaction to anesthesia     "my brother was told they lost him a couple times during OR"  . OSA (obstructive sleep apnea)     "stopped using my CPAP; just couldn't use it cause it kept me awake and I couldn't keep it on my face'"(09/03/2014)  . Heart murmur   . Chronic airway obstruction, not elsewhere classified     "I was told I don't have this/tests done @ [facility] 05/2014"  . Extrinsic asthma, unspecified     "I was told I don't have this/tests done @ [facility] 05/2014"  . Type II diabetes mellitus    . History of hiatal hernia   . Headache     "weekly to monthly" (09/03/2014)  . Migraine     "couple times/month right now" (09/03/2014)  . Spondylosis of unspecified site without mention of myelopathy   . Arthritis     "neck, back, hands" (09/03/2014)  . Diabetic peripheral neuropathy   . Chronic back pain    Past Surgical History  Procedure Laterality Date  . Refractive surgery Bilateral   . Anterior cervical decomp/discectomy fusion      multiple cervical spine levels;Dr. Arnoldo Morale  . Incision and drainage abscess      Chest  . Dilation and curettage of uterus    . Exploratory laparotomy    . Total knee arthroplasty Left 10/2003  . Total knee arthroplasty Right 01/2004    [name]  . Foot surgery Right X 2    "took blood out of my arms and put platelets in my feet"  . Appendectomy    . Tonsillectomy    . Esophagogastroduodenoscopy N/A 05/08/2014    Procedure: ESOPHAGOGASTRODUODENOSCOPY (EGD);  Surgeon: [name], MD;  Location: [name];  Service: Endoscopy;  Laterality: N/A;  . Colonoscopy N/A 05/08/2014    Procedure: COLONOSCOPY;  Surgeon: [name], MD;  Location: [facility];  Service: Endoscopy;  Laterality: N/A;  . Back surgery    . Cholecystectomy open    . Abdominal hysterectomy      "partial"  . Eye surgery    .  Joint replacement     Family History  Problem Relation Age of Onset  . Esophageal cancer Brother   . Esophageal cancer Sister     ?  . Colon polyps Brother   . Pancreatic cancer Sister     ?  . Diabetes Sister   . Diabetes      Aunt and Uncle  . Heart disease Maternal Grandfather    History  Substance Use Topics  . Smoking status: Former Smoker -- 1.00 packs/day for 20 years    Types: Cigarettes    Quit date: 06/22/1986  . Smokeless tobacco: Never Used  . Alcohol Use: Yes     Comment: "stopped drinking in ~ 1983"   OB History    No data available     Review of Systems  Constitutional: Positive for fatigue. Negative for  fever and chills.  Respiratory: Positive for cough, chest tightness, shortness of breath and wheezing.   Cardiovascular: Negative for chest pain, palpitations and leg swelling.  Gastrointestinal: Negative for nausea, vomiting and abdominal pain.  Musculoskeletal: Negative for back pain, neck pain and neck stiffness.  Skin: Negative for rash and wound.  Neurological: Positive for weakness (generalized). Negative for dizziness, light-headedness, numbness and headaches.  All other systems reviewed and are negative.     Allergies  Ace inhibitors; Penicillins; Tape; and Adhesive  Home Medications   Prior to Admission medications   Medication Sig Start Date End Date Taking? Authorizing Provider  albuterol (PROVENTIL) (2.5 MG/3ML) 0.083% nebulizer solution Take 2.5 mg by nebulization every 6 (six) hours as needed for wheezing or shortness of breath.   Yes Historical Provider, MD  aspirin 81 MG tablet Take 81 mg by mouth at bedtime.    Yes Historical Provider, MD  benzonatate (TESSALON) 100 MG capsule Take 1 capsule (100 mg total) by mouth 3 (three) times daily as needed for cough. 08/30/14  Yes Kristen N Ward, DO  cholecalciferol (VITAMIN D) 1000 UNITS tablet Take 2,000 Units by mouth every morning.    Yes Historical Provider, MD  ciprofloxacin (CILOXAN) 0.3 % ophthalmic solution Place 2 drops into the right eye daily. Administer 1 drop, every 2 hours, while awake, for 2 days. Then 1 drop, every 4 hours, while awake, for the next 5 days.   Yes Historical Provider, MD  cyclobenzaprine (FLEXERIL) 5 MG tablet TAKE ONE TABLET BY MOUTH THREE TIMES DAILY AS NEEDED FOR MUSCLE SPASMS 07/24/14  Yes Biagio Borg, MD  diclofenac sodium (VOLTAREN) 1 % GEL Apply 4 g topically 4 (four) times daily as needed (right hand, arm and neck pain.).   Yes Historical Provider, MD  esomeprazole (NEXIUM) 40 MG capsule Take 1 capsule (40 mg total) by mouth 2 (two) times daily. 03/13/14  Yes Collene Gobble, MD  famotidine  (PEPCID) 20 MG tablet TAKE 1 TABLET BY MOUTH EVERY NIGHT AT BEDTIME 08/21/14  Yes Collene Gobble, MD  fluticasone (FLONASE) 50 MCG/ACT nasal spray Place 2 sprays into both nostrils every morning. 04/26/14  Yes Collene Gobble, MD  gabapentin (NEURONTIN) 800 MG tablet TAKE 1 TABLET BY MOUTH THREE TIMES DAILY 06/27/14  Yes Biagio Borg, MD  glucose blood (ONE TOUCH ULTRA TEST) test strip Use to test blood sugars twice a day. Dx 250.63 10/06/13  Yes Renato Shin, MD  HYDROcodone-homatropine [facility]) 5-1.5 MG/5ML syrup Take 5 mLs by mouth every 6 (six) hours as needed for cough. 08/09/14  Yes Collene Gobble, MD  Insulin Glargine (LANTUS) 100 UNIT/ML Solostar  Pen Inject 270 Units into the skin every morning. And pen needles 2/day Patient taking differently: Inject 100 Units into the skin every morning. And pen needles 2/day 07/25/14  Yes Renato Shin, MD  insulin lispro (HUMALOG KWIKPEN) 100 UNIT/ML KiwkPen Inject 0.3 mLs (30 Units total) into the skin daily with supper. 07/25/14  Yes Renato Shin, MD  irbesartan (AVAPRO) 300 MG tablet TAKE 1 TABLET BY MOUTH DAILY 08/21/14  Yes Biagio Borg, MD  Lancets [facility] ULTRASOFT) lancets Use to test blood sugars twice a day. 07/20/13  Yes Renato Shin, MD  loratadine (CLARITIN) 10 MG tablet TAKE 1 TABLET BY MOUTH EVERY DAY 07/26/14  Yes Collene Gobble, MD  metoprolol (LOPRESSOR) 50 MG tablet Take 75 mg by mouth 2 (two) times daily.   Yes Historical Provider, MD  montelukast (SINGULAIR) 10 MG tablet Take 10 mg by mouth at bedtime.   Yes Historical Provider, MD  nystatin cream (MYCOSTATIN) Apply to affected area 2 times daily Patient taking differently: Apply to affected area 2 times daily PRN 12/06/13  Yes Biagio Borg, MD  ondansetron (ZOFRAN ODT) 4 MG disintegrating tablet Take 1 tablet (4 mg total) by mouth every 8 (eight) hours as needed for nausea or vomiting. 08/30/14  Yes Kristen N Ward, DO  oxybutynin (DITROPAN-XL) 10 MG 24 hr tablet TAKE 1 TABLET BY MOUTH DAILY 08/13/14   Yes Biagio Borg, MD  promethazine (PHENERGAN) 25 MG tablet TAKE 1 TABLET BY MOUTH EVERY 6 HOURS AS NEEDED FOR NAUSEA AND VOMITING 08/21/14  Yes Biagio Borg, MD   BP 141/67 mmHg  Pulse 66  Temp(Src) 98.1 F (36.7 C) (Oral)  Resp 18  Ht 5\' 4"  (1.626 m)  Wt 330 lb (149.687 kg)  BMI 56.62 kg/m2  SpO2 99% Physical Exam  Constitutional: She is oriented to person, place, and time. She appears well-developed and well-nourished. No distress.  Obese  HENT:  Head: Normocephalic and atraumatic.  Mouth/Throat: Oropharynx is clear and moist. No oropharyngeal exudate.  Eyes: EOM are normal. Pupils are equal, round, and reactive to light.  Neck: Normal range of motion. Neck supple.  Cardiovascular: Normal rate and regular rhythm.   Pulmonary/Chest: Effort normal and breath sounds normal. No respiratory distress. She has no wheezes. She has no rales.  No respiratory distress. Expiratory wheezing in bilateral bases as well as few scattered crackles  Abdominal: Soft. Bowel sounds are normal.  Musculoskeletal: Normal range of motion. She exhibits no edema or tenderness.  No calf swelling or tenderness.  Neurological: She is alert and oriented to person, place, and time.  Moves all extremities without deficit. Sensation is grossly intact.  Skin: Skin is warm and dry. No rash noted. No erythema.  Psychiatric: She has a normal mood and affect. Her behavior is normal.  Nursing note and vitals reviewed.   ED Course  Procedures (including critical care time) Labs Review Labs Reviewed  CBC WITH DIFFERENTIAL/PLATELET - Abnormal; Notable for the following:    Hemoglobin 11.0 (*)    HCT 35.1 (*)    RDW 16.1 (*)    All other components within normal limits  COMPREHENSIVE METABOLIC PANEL - Abnormal; Notable for the following:    Glucose, Bld 229 (*)    Creatinine, Ser 1.52 (*)    Albumin 2.8 (*)    Total Bilirubin 0.2 (*)    GFR calc non Af Amer 35 (*)    GFR calc Af Amer 41 (*)    All other  components within  normal limits  GLUCOSE, CAPILLARY - Abnormal; Notable for the following:    Glucose-Capillary 326 (*)    All other components within normal limits  BASIC METABOLIC PANEL - Abnormal; Notable for the following:    Sodium 133 (*)    Glucose, Bld 474 (*)    BUN 27 (*)    Creatinine, Ser 1.43 (*)    GFR calc non Af Amer 38 (*)    GFR calc Af Amer 44 (*)    All other components within normal limits  GLUCOSE, CAPILLARY - Abnormal; Notable for the following:    Glucose-Capillary 426 (*)    All other components within normal limits  GLUCOSE, CAPILLARY - Abnormal; Notable for the following:    Glucose-Capillary 364 (*)    All other components within normal limits  GLUCOSE, CAPILLARY - Abnormal; Notable for the following:    Glucose-Capillary 390 (*)    All other components within normal limits  GLUCOSE, CAPILLARY - Abnormal; Notable for the following:    Glucose-Capillary 438 (*)    All other components within normal limits  GLUCOSE, CAPILLARY - Abnormal; Notable for the following:    Glucose-Capillary 457 (*)    All other components within normal limits  GLUCOSE, CAPILLARY - Abnormal; Notable for the following:    Glucose-Capillary 431 (*)    All other components within normal limits  GLUCOSE, CAPILLARY - Abnormal; Notable for the following:    Glucose-Capillary 408 (*)    All other components within normal limits  GLUCOSE, CAPILLARY - Abnormal; Notable for the following:    Glucose-Capillary 349 (*)    All other components within normal limits  GLUCOSE, CAPILLARY - Abnormal; Notable for the following:    Glucose-Capillary 297 (*)    All other components within normal limits  GLUCOSE, CAPILLARY - Abnormal; Notable for the following:    Glucose-Capillary 270 (*)    All other components within normal limits  GLUCOSE, CAPILLARY - Abnormal; Notable for the following:    Glucose-Capillary 240 (*)    All other components within normal limits  GLUCOSE, CAPILLARY -  Abnormal; Notable for the following:    Glucose-Capillary 211 (*)    All other components within normal limits  GLUCOSE, CAPILLARY - Abnormal; Notable for the following:    Glucose-Capillary 173 (*)    All other components within normal limits  GLUCOSE, CAPILLARY - Abnormal; Notable for the following:    Glucose-Capillary 165 (*)    All other components within normal limits  GLUCOSE, CAPILLARY - Abnormal; Notable for the following:    Glucose-Capillary 171 (*)    All other components within normal limits  GLUCOSE, CAPILLARY - Abnormal; Notable for the following:    Glucose-Capillary 141 (*)    All other components within normal limits  MRSA PCR SCREENING  BRAIN NATRIURETIC PEPTIDE  TROPONIN I  BASIC METABOLIC PANEL    Imaging Review Dg Chest 2 View  09/02/2014   CLINICAL DATA:  Weakness.  EXAM: CHEST  2 VIEW  COMPARISON:  08/29/2014  FINDINGS: The heart is enlarged, unchanged basilar assessment is limited due to soft tissue attenuation from body habitus. Central vascular congestion is suspected. No evidence of confluent airspace disease. No large pleural effusion.  IMPRESSION: Cardiomegaly.  Suspect vascular congestion.   Electronically Signed   By: Jeb Levering M.D.   On: 09/02/2014 05:50     EKG Interpretation   Date/Time:  Sunday September 02 2014 05:57:14 EDT Ventricular Rate:  90 PR Interval:  171 QRS Duration:  76 QT Interval:  373 QTC Calculation: 456 R Axis:   3 Text Interpretation:  Sinus rhythm Left ventricular hypertrophy  Nonspecific T abnormalities, anterior leads Confirmed by Lita Mains  MD,  Lauri Till ([id number]) on 09/02/2014 6:48:23 AM      MDM   Final diagnoses:  None    Patient states she is feeling much better after initial breathing treatment. Wheezing has improved. Patient likely has bronchitis with bronchospasm.   Patient oxygen saturation drops to 86% on room air while ambulating. Will discuss with [facility] about admission for shortness of breath and  hypoxia.  Julianne Rice, MD 09/04/14 [phone]

## 2014-09-02 NOTE — ED Notes (Addendum)
Pt to finish her cont neb at 0900 then will transport to the floor. 6N RN made aware.

## 2014-09-02 NOTE — ED Notes (Signed)
The pt was sleeping until i called her nam   comfortable

## 2014-09-02 NOTE — ED Notes (Signed)
The pt is c/o being weak  For one week and she  Has not had an appetite  And is not able to care for herself.  She was here Wednesday for the same and is no better.  She had a full work-up then.  She thinks she needs an antibiotic

## 2014-09-03 ENCOUNTER — Encounter (HOSPITAL_COMMUNITY): Payer: Self-pay | Admitting: General Practice

## 2014-09-03 DIAGNOSIS — J069 Acute upper respiratory infection, unspecified: Secondary | ICD-10-CM

## 2014-09-03 DIAGNOSIS — N179 Acute kidney failure, unspecified: Secondary | ICD-10-CM | POA: Diagnosis not present

## 2014-09-03 DIAGNOSIS — E1041 Type 1 diabetes mellitus with diabetic mononeuropathy: Secondary | ICD-10-CM | POA: Diagnosis not present

## 2014-09-03 LAB — GLUCOSE, CAPILLARY
GLUCOSE-CAPILLARY: 438 mg/dL — AB (ref 70–99)
GLUCOSE-CAPILLARY: 408 mg/dL — AB (ref 70–99)
GLUCOSE-CAPILLARY: 349 mg/dL — AB (ref 70–99)
GLUCOSE-CAPILLARY: 240 mg/dL — AB (ref 70–99)
Glucose-Capillary: 457 mg/dL — ABNORMAL HIGH (ref 70–99)
Glucose-Capillary: 431 mg/dL — ABNORMAL HIGH (ref 70–99)
Glucose-Capillary: 297 mg/dL — ABNORMAL HIGH (ref 70–99)
Glucose-Capillary: 270 mg/dL — ABNORMAL HIGH (ref 70–99)
Glucose-Capillary: 211 mg/dL — ABNORMAL HIGH (ref 70–99)

## 2014-09-03 LAB — BASIC METABOLIC PANEL
ANION GAP: 6 (ref 5–15)
BUN: 27 mg/dL — AB (ref 6–23)
CALCIUM: 9 mg/dL (ref 8.4–10.5)
CO2: 28 mmol/L (ref 19–32)
CREATININE: 1.43 mg/dL — AB (ref 0.50–1.10)
Chloride: 99 mmol/L (ref 96–112)
GFR calc Af Amer: 44 mL/min — ABNORMAL LOW (ref >90)
GFR calc non Af Amer: 38 mL/min — ABNORMAL LOW (ref >90)
Glucose, Bld: 474 mg/dL — ABNORMAL HIGH (ref 70–99)
Potassium: 4.5 mmol/L (ref 3.5–5.1)
Sodium: 133 mmol/L — ABNORMAL LOW (ref 135–145)

## 2014-09-03 MED ORDER — SODIUM CHLORIDE 0.9 % IV SOLN
INTRAVENOUS | Status: DC
  Administered 2014-09-03: 4 [IU]/h via INTRAVENOUS
  Administered 2014-09-03: 10.6 [IU]/h via INTRAVENOUS
  Administered 2014-09-04: 7.9 [IU]/h via INTRAVENOUS
  Filled 2014-09-03: qty 2.5

## 2014-09-03 MED ORDER — PREDNISONE 20 MG PO TABS
30.0000 mg | ORAL_TABLET | Freq: Every day | ORAL | Status: DC
  Administered 2014-09-04 – 2014-09-05 (×2): 30 mg via ORAL
  Filled 2014-09-03 (×4): qty 1

## 2014-09-03 MED ORDER — FLUCONAZOLE 100 MG PO TABS
100.0000 mg | ORAL_TABLET | Freq: Once | ORAL | Status: AC
  Administered 2014-09-03: 100 mg via ORAL
  Filled 2014-09-03: qty 1

## 2014-09-03 MED ORDER — INSULIN ASPART 100 UNIT/ML ~~LOC~~ SOLN
10.0000 [IU] | Freq: Once | SUBCUTANEOUS | Status: AC
  Administered 2014-09-03: 10 [IU] via SUBCUTANEOUS

## 2014-09-03 MED ORDER — SODIUM CHLORIDE 0.9 % IV SOLN: INTRAVENOUS | Status: DC

## 2014-09-03 MED ORDER — INSULIN REGULAR BOLUS VIA INFUSION
0.0000 [IU] | Freq: Three times a day (TID) | INTRAVENOUS | Status: DC
  Filled 2014-09-03: qty 10

## 2014-09-03 MED ORDER — DEXTROSE-NACL 5-0.45 % IV SOLN
INTRAVENOUS | Status: DC
  Administered 2014-09-04: via INTRAVENOUS

## 2014-09-03 MED ORDER — DEXTROSE 50 % IV SOLN: 25.0000 mL | INTRAVENOUS | Status: DC | PRN

## 2014-09-03 NOTE — Progress Notes (Addendum)
PROGRESS NOTE  Christy May MCN:[id number] DOB: 1950/11/27 DOA: 09/02/2014 PCP: Cathlean Cower, MD  HPI: Christy May is a 64 year old female with type I uncontrolled diabetes mellitus neuropathy, chronic rhinitis, GERD, and HTN who presented to [facility] on 09/02/14 with productive cough and shortness of breath with generalized weakness for past 2 weeks. Patient saw Dr. Lamonte Sakai 1 months back for chronic cough and was treated with a course of z pak during that time. She came to the ED 5 days back with similar symptoms . At that time she had cough with whitish phlegm and weakness. She was discharged home but did not feel better and continued to have cough now with greyish sputum and occasionally blood-tinged. She reports dyspnea on exertion and wheezing along with generalized weakness and sweats.. Patient denies headache, dizziness, nausea , vomiting, chest pain, palpitations, abdominal pain, bowel or urinary symptoms. Reports poor appetite. Denies weight loss. In the ED, CXR showed possible vascular congestion.   Subjective Today, Christy May is feeling better. She is currently symptom free on room air. She has not been able to ambulate without assistance but is willing to work with PT this afternoon. Denies nausea, vomiting, diarrhea, chest pain, and DOE. Reports feeling symptoms of possible yeast infection but denies vaginal discharge.   Assessment/Plan:  Acute bronchitis with reactive airway disease - Patient was admitted for observation due to hypoxia in the ED.  - Placed on Solu-Medrol 60 mg every 8 hours, albuterol nebs, empiric Levaquin, and mucinex on admission - Patient is no longer wheezing and is now coughing less - Discontinued Solu-Medrol given significant hyperglycemia and replaced with prednisone 30mg  starting tomorrow - Patient most recent O2 saturation was 100% on 2L nasal cannula  - Patient saw pulmonologist Dr. Lamonte Sakai recently for ongoing cough- May arrange follow up upon  discharge.  - Continue Albuterol nebs, Levaquin, Mucinex, and prednisone.   Acute kidney injury in the setting of CKD stage III based on previous values - likely in the setting of long standing diabetes - Patient presented with creatinine of 1.52 - Today creatinine 1.43, at baseline  - Continue to monitor  Chronic Rhinitis  - Continue Singulair, Claritin, and Flonase   Chronic GERD - Continue Pepcid 20mg  daily and Protonix 40mg  BID  Type 1 Diabetes mellitus, uncontrolled with peripheral neuropathy - Patient was hyperglycemic likely due to Solu-Medrol, which was discontinued today and patient started on prednisone 30mg . - Last CBG was 457 - difficult to control with s.q insulin, May start glucostabilizer - patient is on very high doses of Lantus as an outpatient 270 U, just decreased to 100 U due to hypoglycemia. - may benefit from U500 with meals and Toujeo for long acting, defer to outpatient MD and also depends whether she May be able to get these or whether her insurance May cover. - diabetes educator consult  Essential hypertension - Patient home BP meds have been held due to AKI - Stable at 133/72  Generalized weakness - Likely contributed by ongoing illness. PT evaluation ordered  Morbid obesity - Counseled on diet and exercise to lose weight.  DVT Prophylaxis:  Heparin   Code Status: Full  Family Communication: No family at bedside  Disposition Plan: Remain inpatient   Consultants:  Physical therapy   Social work  Procedures:  2D echo pending  Antibiotics:  Levaquin   Objective: Filed Vitals:   09/02/14 2146 09/03/14 0613 09/03/14 1221 09/03/14 1327  BP: 139/63 163/84  138/72  Pulse: 80 84  84  Temp: 97.6 F (36.4 C) 97.9 F (36.6 C)  97.8 F (36.6 C)  TempSrc: Oral Oral  Oral  Resp: 16 16  16   Height:      Weight:      SpO2: 95% 96% 100% 100%    Intake/Output Summary (Last 24 hours) at 09/03/14 1437 Last data filed at 09/03/14 0915   Gross per 24 hour  Intake   1650 ml  Output      3 ml  Net   1647 ml   Filed Weights   09/02/14 0037  Weight: 149.687 kg (330 lb)   Exam: General: Well developed, well nourished, NAD, appears stated age  75:  Anicteic Sclera Cardiovascular: RRR, S1 S2 auscultated, no rubs, murmurs or gallops.   Respiratory: Clear to auscultation bilaterally with equal chest rise- no rales, rhonchi, or wheezes.  Abdomen: Soft, nontender, nondistended, + bowel sounds  Extremities: warm dry, 2+ pitting edema in LE bilaterally  Skin: Without rashes  Psych: Normal affect and demeanor with intact judgement and insight  Data Reviewed: Basic Metabolic Panel:  Recent Labs Lab 08/29/14 2244 09/02/14 0610 09/03/14 0531  NA 132* 136 133*  K 4.2 4.3 4.5  CL 99 98 99  CO2 25 30 28   GLUCOSE 298* 229* 474*  BUN 21 20 27*  CREATININE 1.66* 1.52* 1.43*  CALCIUM 8.6 9.0 9.0   Liver Function Tests:  Recent Labs Lab 08/29/14 2244 09/02/14 0610  AST 20 17  ALT 18 16  ALKPHOS 53 53  BILITOT 0.4 0.2*  PROT 6.8 6.7  ALBUMIN 3.1* 2.8*   CBC:  Recent Labs Lab 08/29/14 2244 09/02/14 0610  WBC 6.7 4.8  NEUTROABS 4.6 2.5  HGB 11.3* 11.0*  HCT 35.9* 35.1*  MCV 84.1 84.4  PLT 137* 150   Cardiac Enzymes:  Recent Labs Lab 09/02/14 0610  TROPONINI <0.03   BNP (last 3 results)  Recent Labs  09/02/14 0550  BNP 79.3   CBG:  Recent Labs Lab 09/02/14 1659 09/02/14 1844 09/02/14 2141 09/03/14 0718 09/03/14 1139  GLUCAP 426* 364* 390* 438* 457*    Recent Results (from the past 240 hour(s))  MRSA PCR Screening     Status: None   Collection Time: 09/02/14  7:00 PM  Result Value Ref Range Status   MRSA by PCR NEGATIVE NEGATIVE Final    Comment:        The GeneXpert MRSA Assay (FDA approved for NASAL specimens only), is one component of a comprehensive MRSA colonization surveillance program. It is not intended to diagnose MRSA infection nor to guide or monitor treatment  for MRSA infections.      Studies: Dg Chest 2 View  09/02/2014   CLINICAL DATA:  Weakness.  EXAM: CHEST  2 VIEW  COMPARISON:  08/29/2014  FINDINGS: The heart is enlarged, unchanged basilar assessment is limited due to soft tissue attenuation from body habitus. Central vascular congestion is suspected. No evidence of confluent airspace disease. No large pleural effusion.  IMPRESSION: Cardiomegaly.  Suspect vascular congestion.   Electronically Signed   By: Jeb Levering M.D.   On: 09/02/2014 05:50    Scheduled Meds: . albuterol  2.5 mg Nebulization Q4H  . aspirin  81 mg Oral QHS  . cholecalciferol  2,000 Units Oral q morning - 10a  . famotidine  20 mg Oral QHS  . fluticasone  2 spray Each Nare q morning - 10a  . gabapentin  800 mg Oral TID  . guaiFENesin  600  mg Oral BID  . heparin  5,000 Units Subcutaneous 3 times per day  . insulin glargine  100 Units Subcutaneous Daily  . insulin regular  0-10 Units Intravenous TID WC  . levofloxacin (LEVAQUIN) IV  750 mg Intravenous Q24H  . loratadine  10 mg Oral Daily  . metoprolol  75 mg Oral BID  . montelukast  10 mg Oral QHS  . oxybutynin  10 mg Oral Daily  . pantoprazole  40 mg Oral BID  . [START ON 09/04/2014] predniSONE  30 mg Oral Q breakfast   Continuous Infusions: . sodium chloride 75 mL/hr at 09/03/14 0230  . sodium chloride    . dextrose 5 % and 0.45% NaCl    . insulin (NOVOLIN-R) infusion 4 Units/hr (09/03/14 1314)    Principal Problem:   Acute bronchitis Active Problems:   Morbid obesity   Essential hypertension   GERD   Type 1 diabetes mellitus with neurological manifestations, uncontrolled   Acute kidney injury   Acute upper respiratory infection   Weakness generalized    Raspect, Erin, PA-S [facility] 09/03/2014, 2:37 PM    Costin M. Cruzita Lederer, MD [facility] [phone]

## 2014-09-03 NOTE — Progress Notes (Addendum)
Inpatient Diabetes Program Recommendations  AACE/ADA: New Consensus Statement on Inpatient Glycemic Control (2013)  Target Ranges:  Prepandial:   less than 140 mg/dL      Peak postprandial:   less than 180 mg/dL (1-2 hours)      Critically ill patients:  140 - 180 mg/dL     Results for Christy May, Christy May (MRN [id number]) as of 09/03/2014 08:31  Ref. Range 09/02/2014 11:50 09/02/2014 16:59 09/02/2014 18:44 09/02/2014 21:41  Glucose-Capillary Latest Range: 70-99 mg/dL 326 (H) 426 (H) 364 (H) 390 (H)    Results for Christy May, Christy May (MRN [id number]) as of 09/03/2014 08:31  Ref. Range 09/03/2014 07:18  Glucose-Capillary Latest Range: 70-99 mg/dL 438 (H)     Chief Complaint: SOB/ Bronchitis  History: DM, HTN, Morbid Obesity  Home DM Meds: Lantus 100 units daily         Humalog 30 units Qsupper  Current DM Orders: Lantus 100 units daily    Novolog Resistant SSI tid ac + HS    Novolog 12 units tidwc    **Note patient was started on IV Solumedrol yesterday.  Current Orders= IV Solumedrol 60 mg Q8 hours.  **Patient having hyperglycemia.    **Note patient sees Dr. Loanne Drilling with [facility] as an outpatient.  Her dose of Lantus was decreased by Dr. Loanne Drilling on 08/24/14 to 100 units daily since patient was doing a Lenten fast with her church and was having some Hypoglycemia at home.  Patient was taking Lantus 270 units daily before she started fasting.    MD- Please consider the following in-hospital insulin adjustments while patient on IV steroids:  1. Increase Lantus to 120 units daily- May want to split the dose to 60 units bid to get better coverage  2. Increase Novolog Meal Coverage to 15 units tid with meals     Will follow [name] RN, MSN, CDE Diabetes Coordinator Inpatient Diabetes Program Team Pager: [phone] (8a-5p)

## 2014-09-03 NOTE — Evaluation (Signed)
Physical Therapy Evaluation Patient Details Name: Christy May MRN: [id number] DOB: 02/22/1951 Today's Date: 09/03/2014   History of Present Illness  64 year old morbidly obese female with type I uncontrolled diabetes mellitus neuropathy, chronic rhinitis , GERD, vocal cord dysfunction ( following with outpt speech therapy), essential hypertension who presented to the ED with productive cough and shortness of breath with generalized weakness for past 2 weeks.  Past Medical History  Diagnosis Date  . Anemia, unspecified   . Extrinsic asthma, unspecified   . Chronic airway obstruction, not elsewhere classified   . Spondylosis of unspecified site without mention of myelopathy   . Dehydration   . Depressive disorder, not elsewhere classified   . Type II or unspecified type diabetes mellitus without mention of complication, not stated as uncontrolled   . Esophageal reflux   . Unspecified essential hypertension   . Unspecified menopausal and postmenopausal disorder   . Obesity, unspecified   . Inflammatory and toxic neuropathy, unspecified   . Unspecified sleep apnea   . Unspecified vitamin D deficiency   . Dysphagia 2007    historyof dysphagia with severe dysmotility by barium swallow-[name]  . Obesity   . Hypertension   . Diabetes mellitus without complication   . Allergic bronchitis   . Sleep apnea   . Gout 01/04/2013   Past Surgical History  Procedure Laterality Date  . Laser sugery      bilateral eyes  . Back surgery      fusion-multiple cervical spine levels-Dr. Arnoldo Morale  . Excision of abcess      ?? Chest  . Dilation and curettage of uterus    . Exploratory laparotomy    . Partial hysterectomy    . Total knee arthroplasty  2005    Left  . Total knee arthroplasty  2005    Right- [name]  . Foot surgery      Right X 2  . Cholecystectomy    . Appendectomy    . Tonsillectomy    . Abdominal hysterectomy    . Esophagogastroduodenoscopy N/A 05/08/2014     Procedure: ESOPHAGOGASTRODUODENOSCOPY (EGD);  Surgeon: [name], MD;  Location: [name];  Service: Endoscopy;  Laterality: N/A;  . Colonoscopy N/A 05/08/2014    Procedure: COLONOSCOPY;  Surgeon: [name], MD;  Location: [facility];  Service: Endoscopy;  Laterality: N/A;     Clinical Impression   Pt admitted with above diagnosis. Pt currently with functional limitations due to the deficits listed below (see PT Problem List).  Pt will benefit from skilled PT to increase their independence and safety with mobility to allow discharge to the venue listed below.    Took time to validate pt's frustration with the situation, and gain some rapport, with goal of increasing pt's participation in therapy      Follow Up Recommendations Outpatient PT (and her continuing Outpt Speech Therapy)    Equipment Recommendations  3in1 (PT) (will continue to monitor; she may not need it)    Recommendations for Other Services       Precautions / Restrictions Precautions Precautions: Fall      Mobility  Bed Mobility                  Transfers Overall transfer level: Needs assistance Equipment used: Rolling walker (2 wheeled) Transfers: Sit to/from Stand Sit to Stand: Min guard         General transfer comment: Cues for hand placement and safety; she tends to pull up  on RW  Ambulation/Gait Ambulation/Gait assistance: Min guard Ambulation Distance (Feet): 50 Feet (with one seated rest break) Assistive device: Rolling walker (2 wheeled) Gait Pattern/deviations: Decreased stride length Gait velocity: quite slow   General Gait Details: Cues for posture, and to self monitor for activity tolerance; noted pt became slightly shaky with increased distance ambulated  Stairs            Wheelchair Mobility    Modified Rankin (Stroke Patients Only)       Balance                                             Pertinent Vitals/Pain Pain Assessment:   (no specific reports of pain)    Home Living Family/patient expects to be discharged to:: Private residence Living Arrangements: Alone Available Help at Discharge: Other (Comment) (Pt did not indicate that she has any help at home) Type of Home: House Home Access: Stairs to enter   [employer] of Steps: 3 Home Layout: One level Home Equipment: Walker - 4 wheels      Prior Function Level of Independence: Independent with assistive device(s)         Comments: uses rollator RW from time to time     Hand Dominance        Extremity/Trunk Assessment   Upper Extremity Assessment: Generalized weakness           Lower Extremity Assessment: Generalized weakness         Communication   Communication: No difficulties  Cognition Arousal/Alertness: Awake/alert Behavior During Therapy: WFL for tasks assessed/performed;Flat affect (Pt was slow to speak, and took a lot of time to give very detailed history) Overall Cognitive Status: Within Functional Limits for tasks assessed                      General Comments General comments (skin integrity, edema, etc.): Ambulated on Room Air, and noted O2 sats ranged from 89% (observed lowest) to 99%    Exercises        Assessment/Plan    PT Assessment Patient needs continued PT services  PT Diagnosis Generalized weakness   PT Problem List Decreased strength;Decreased activity tolerance;Decreased mobility;Decreased knowledge of use of DME;Decreased knowledge of precautions;Cardiopulmonary status limiting activity  PT Treatment Interventions DME instruction;Gait training;Stair training;Functional mobility training;Therapeutic activities;Therapeutic exercise;Patient/family education   PT Goals (Current goals can be found in the Care Plan section) Acute Rehab PT Goals Patient Stated Goal: wants to feel better PT Goal Formulation: With patient Time For Goal Achievement: 09/17/14 Potential to Achieve Goals:  Good    Frequency Min 3X/week   Barriers to discharge        Co-evaluation               End of Session Equipment Utilized During Treatment: Other (comment) (Pulse oximeter) Activity Tolerance: Patient tolerated treatment well Patient left: in chair;with call bell/phone within reach;Other (comment) (with Respiratory Therapist)      Functional Assessment Tool Used: Clinical Judgement Functional Limitation: Mobility: Walking and moving around Mobility: Walking and Moving Around Current Status [phone]): At least 1 percent but less than 20 percent impaired, limited or restricted Mobility: Walking and Moving Around Goal Status [phone]): 0 percent impaired, limited or restricted    Time: 1137-1221 (minus approx 7 minutes while MD consulting with pt) PT Time Calculation (min) (ACUTE ONLY):  44 min   Charges:   PT Evaluation $Initial PT Evaluation Tier I: 1 Procedure PT Treatments $Gait Training: 8-22 mins   PT G Codes:   PT G-Codes **NOT FOR INPATIENT CLASS** Functional Assessment Tool Used: Clinical Judgement Functional Limitation: Mobility: Walking and moving around Mobility: Walking and Moving Around Current Status ([id number]): At least 1 percent but less than 20 percent impaired, limited or restricted Mobility: Walking and Moving Around Goal Status [phone]): 0 percent impaired, limited or restricted    Roney Marion Hamff 09/03/2014, 1:04 PM  Roney Marion, [place] Pager [phone] Office [phone]

## 2014-09-03 NOTE — Progress Notes (Signed)
UR completed 

## 2014-09-04 ENCOUNTER — Ambulatory Visit: Payer: Medicare Other

## 2014-09-04 DIAGNOSIS — J209 Acute bronchitis, unspecified: Secondary | ICD-10-CM | POA: Diagnosis not present

## 2014-09-04 DIAGNOSIS — E1041 Type 1 diabetes mellitus with diabetic mononeuropathy: Secondary | ICD-10-CM | POA: Diagnosis not present

## 2014-09-04 DIAGNOSIS — J9600 Acute respiratory failure, unspecified whether with hypoxia or hypercapnia: Secondary | ICD-10-CM | POA: Diagnosis not present

## 2014-09-04 LAB — GLUCOSE, CAPILLARY
GLUCOSE-CAPILLARY: 123 mg/dL — AB (ref 70–99)
GLUCOSE-CAPILLARY: 138 mg/dL — AB (ref 70–99)
GLUCOSE-CAPILLARY: 156 mg/dL — AB (ref 70–99)
GLUCOSE-CAPILLARY: 173 mg/dL — AB (ref 70–99)
GLUCOSE-CAPILLARY: 207 mg/dL — AB (ref 70–99)
Glucose-Capillary: 165 mg/dL — ABNORMAL HIGH (ref 70–99)
Glucose-Capillary: 171 mg/dL — ABNORMAL HIGH (ref 70–99)
Glucose-Capillary: 141 mg/dL — ABNORMAL HIGH (ref 70–99)
Glucose-Capillary: 137 mg/dL — ABNORMAL HIGH (ref 70–99)
Glucose-Capillary: 116 mg/dL — ABNORMAL HIGH (ref 70–99)
Glucose-Capillary: 167 mg/dL — ABNORMAL HIGH (ref 70–99)
Glucose-Capillary: 409 mg/dL — ABNORMAL HIGH (ref 70–99)
Glucose-Capillary: 416 mg/dL — ABNORMAL HIGH (ref 70–99)

## 2014-09-04 LAB — BASIC METABOLIC PANEL
Anion gap: 9 (ref 5–15)
BUN: 34 mg/dL — AB (ref 6–23)
CALCIUM: 9.5 mg/dL (ref 8.4–10.5)
CO2: 28 mmol/L (ref 19–32)
CREATININE: 1.42 mg/dL — AB (ref 0.50–1.10)
Chloride: 102 mmol/L (ref 96–112)
GFR calc Af Amer: 44 mL/min — ABNORMAL LOW (ref >90)
GFR calc non Af Amer: 38 mL/min — ABNORMAL LOW (ref >90)
Glucose, Bld: 147 mg/dL — ABNORMAL HIGH (ref 70–99)
Potassium: 4.5 mmol/L (ref 3.5–5.1)
SODIUM: 139 mmol/L (ref 135–145)

## 2014-09-04 LAB — TROPONIN I: Troponin I: <0.03 ng/mL (ref <0.031)

## 2014-09-04 MED ORDER — LEVOFLOXACIN 750 MG PO TABS
750.0000 mg | ORAL_TABLET | Freq: Every day | ORAL | Status: DC
  Administered 2014-09-04 – 2014-09-05 (×2): 750 mg via ORAL
  Filled 2014-09-04 (×2): qty 1

## 2014-09-04 MED ORDER — INSULIN ASPART 100 UNIT/ML ~~LOC~~ SOLN
0.0000 [IU] | Freq: Every day | SUBCUTANEOUS | Status: DC
  Administered 2014-09-04: 2 [IU] via SUBCUTANEOUS

## 2014-09-04 MED ORDER — INSULIN GLARGINE 100 UNIT/ML ~~LOC~~ SOLN
100.0000 [IU] | Freq: Every day | SUBCUTANEOUS | Status: DC
  Administered 2014-09-04 – 2014-09-05 (×2): 100 [IU] via SUBCUTANEOUS
  Filled 2014-09-04 (×2): qty 1

## 2014-09-04 MED ORDER — INSULIN ASPART 100 UNIT/ML ~~LOC~~ SOLN: 20.0000 [IU] | Freq: Once | SUBCUTANEOUS | Status: DC

## 2014-09-04 MED ORDER — INSULIN ASPART 100 UNIT/ML ~~LOC~~ SOLN
6.0000 [IU] | Freq: Three times a day (TID) | SUBCUTANEOUS | Status: DC
  Administered 2014-09-04 (×2): 6 [IU] via SUBCUTANEOUS

## 2014-09-04 MED ORDER — INSULIN ASPART 100 UNIT/ML ~~LOC~~ SOLN
0.0000 [IU] | Freq: Three times a day (TID) | SUBCUTANEOUS | Status: DC
  Administered 2014-09-04: 4 [IU] via SUBCUTANEOUS
  Administered 2014-09-04: 20 [IU] via SUBCUTANEOUS
  Administered 2014-09-05: 4 [IU] via SUBCUTANEOUS
  Administered 2014-09-05: 7 [IU] via SUBCUTANEOUS

## 2014-09-04 MED ORDER — INSULIN ASPART 100 UNIT/ML ~~LOC~~ SOLN
20.0000 [IU] | Freq: Once | SUBCUTANEOUS | Status: AC
  Administered 2014-09-04: 20 [IU] via SUBCUTANEOUS

## 2014-09-04 MED ORDER — INSULIN ASPART 100 UNIT/ML ~~LOC~~ SOLN
15.0000 [IU] | Freq: Three times a day (TID) | SUBCUTANEOUS | Status: DC
Start: 2014-09-05 — End: 2014-09-05
  Administered 2014-09-05: 15 [IU] via SUBCUTANEOUS

## 2014-09-04 NOTE — Progress Notes (Signed)
Patient active with [facility] services. Came to bedside to discuss that [facility] team will follow up with her post hospital discharge. During visit, inpatient Licensed CSW at bedside as well. Noted patient does not qualify for SNF placement due to observation status. Patient states she would like to have home health arranged as well. Such as PT/OT/RN and aide. Patient also indicates she was declined for meals on wheels in the past. [facility] to follow up on patient's voiced concerns as well. Left contact information at bedside with patient. Will make inpatient RNCM aware.  Marthenia Rolling, MSN- [facility] URKYHCW-[phone]

## 2014-09-04 NOTE — Progress Notes (Signed)
UR completed 

## 2014-09-04 NOTE — Care Management Note (Addendum)
  Page 2 of 2   09/07/2014     11:48:37 AM CARE MANAGEMENT NOTE 09/07/2014  Patient:  Christy May, Christy May   Account Number:  [id number]  Date Initiated:  09/04/2014  Documentation initiated by:  Magdalen Spatz  Subjective/Objective Assessment:     Action/Plan:   Anticipated DC Date:  09/05/2014   Anticipated DC Plan:  [place]         Choice offered to / List presented to:  C-1 Patient        [place] arranged  [place].   Status of service:  In process, will continue to follow Medicare Important Message given?   (If response is "NO", the following Medicare IM given date fields will be blank) Date Medicare IM given:   Medicare IM given by:   Date Additional Medicare IM given:   Additional Medicare IM given by:    Discharge Disposition:    Per UR Regulation:    If discussed at Long Length of Stay Meetings, dates discussed:    Comments:  09-07-14 Received order for  home health RN , Colletta Maryland with [facility] aware. Magdalen Spatz RN BSN     09-07-14 Received orders for home health nurses aide . Colletta Maryland with [facility] aware. Magdalen Spatz RN BSN [phone]    09-04-14 Patient confirmed face sheet information. Patient has bedside commode at home. Patient has had oxygen at 2l [place] at night  in the past, through [facility] ,  but states "that is not current " .  Confirmed with Baker Janus at [facility] patient does not currently have home oxygen , last order [facility] received was in 2010 .   If home oxygen needed at discharge will need saturation note and order.  Thanks   Magdalen Spatz RN BSN

## 2014-09-04 NOTE — Progress Notes (Signed)
Inpatient Diabetes Program Recommendations  AACE/ADA: New Consensus Statement on Inpatient Glycemic Control (2013)  Target Ranges:  Prepandial:   less than 140 mg/dL      Peak postprandial:   less than 180 mg/dL (1-2 hours)      Critically ill patients:  140 - 180 mg/dL    Results for COURTENEY, ALDERETE (MRN [id number]) as of 09/04/2014 11:58  Ref. Range 09/03/2014 07:18 09/03/2014 11:39 09/03/2014 14:40 09/03/2014 16:18 09/03/2014 17:27 09/03/2014 18:49 09/03/2014 20:10 09/03/2014 21:56 09/03/2014 23:00 09/03/2014 23:59  Glucose-Capillary Latest Range: 70-99 mg/dL 438 (H) 457 (H) 431 (H) 408 (H) 349 (H) 297 (H) 270 (H) 240 (H) 211 (H) 173 (H)    Results for VARNELL, DONATE (MRN [id number]) as of 09/04/2014 11:58  Ref. Range 09/04/2014 01:07 09/04/2014 02:08 09/04/2014 03:33 09/04/2014 05:16 09/04/2014 06:28 09/04/2014 07:41 09/04/2014 09:06 09/04/2014 10:29  Glucose-Capillary Latest Range: 70-99 mg/dL 165 (H) 171 (H) 141 (H) 156 (H) 138 (H) 137 (H) 116 (H) 123 (H)     Chief Complaint: SOB/ Bronchitis  History: DM, HTN, Morbid Obesity  Home DM Meds: Lantus 100 units daily  Humalog 30 units Qsupper  Current DM Orders: Lantus 100 units daily Novolog Resistant SSI tid ac + HS Novolog 6 units tidwc    **Note patient was started on IV Solumedrol 03/13.  Last dose was given yesterday at 6AM.  Patient now receiving Prednisone 30 mg daily.  **Patient was having issues with hyperglycemia yesterday likely due to the steroids.  Was started on IV insulin drip per the GlucoStabilizer at ~1PM yesterday.  **Note patient sees Dr. Loanne Drilling with [facility] as an outpatient. Her dose of Lantus was decreased by Dr. Loanne Drilling on 08/24/14 to 100 units daily since patient was doing a Lenten fast with her church and was having some Hypoglycemia at home. Patient was taking Lantus 270 units daily before she  started fasting.  **Note patient transitioned off IV insulin drip this morning back to Lantus and Novolog.    Patient was seen by Jeanie Sewer, CDE with the [facility] and DM management center on 08/01/14.  Per Chart review, patient plans to follow with Ms. Derinda Sis for continued counseling for weight loss.  Has considered bariatric surgery as well.  Note Dr. Arman Filter progress notes stating patient may be a candidate for U-500 insulin at home.  Patient will need to see her Endocrinologist for further insulin adjustments after discharge home.     Will follow [name] RN, MSN, CDE Diabetes Coordinator Inpatient Diabetes Program Team Pager: [phone] (8a-5p)

## 2014-09-04 NOTE — Clinical Social Work Note (Signed)
CSW met with patient and discussed with her SNF placement.  Patient has only been on observation status, CSW explained that patient would have to pay privately if she still wanted to go to SNF, patient stated she can not afford to pay privately.  CSW explained to patient that if she wants home health PT, nursing, and an aide, the case manager can talk to physician to see if orders can be written for home health.  CSW notified case manager that patient would like home health if PT recommends.  CSW will sign off, please reconsult if other social work needs arise.  Jones Broom. Balta, MSW, [place] 09/04/2014 3:32 PM

## 2014-09-04 NOTE — Progress Notes (Signed)
Physical Therapy Treatment Patient Details Name: Christy May MRN: [id number] DOB: May 20, 1951 Today's Date: 09/04/2014    History of Present Illness 64 year old morbidly obese female with type I uncontrolled diabetes mellitus neuropathy, chronic rhinitis , GERD, vocal cord dysfunction ( following with outpt speech therapy), essential hypertension who presented to the ED with productive cough and shortness of breath with generalized weakness for past 2 weeks.    PT Comments    Patient progressing well and stated that she had just return to bed after being up most of the morning. Patient on 3L of O2 but removed during session and remained 90% or higher while working with therapy. Patient concerned about being able to make OPPT appointments due to lack of assistance at home. After speaking with patient and with case manager, I believe the safest and most beneficial plan at this time is for patient to be followed up by HHPT. Patient with continued weakness this session and limited mobility due to fatigue.   Follow Up Recommendations  Home health PT;Supervision - Intermittent     Equipment Recommendations  None recommended by PT    Recommendations for Other Services       Precautions / Restrictions Precautions Precautions: Fall    Mobility  Bed Mobility Overal bed mobility: Modified Independent                Transfers Overall transfer level: Needs assistance Equipment used: Rolling walker (2 wheeled)   Sit to Stand: Supervision         General transfer comment: Cues for hand placement and safety; she tends to pull up on RW  Ambulation/Gait Ambulation/Gait assistance: Min guard Ambulation Distance (Feet): 60 Feet Assistive device: Rolling walker (2 wheeled)   Gait velocity: very decreased   General Gait Details: Cues for posture. Unable to increase gait speed on request due to fatigue. O2 sats remained above 90% on RA.    Stairs            Wheelchair  Mobility    Modified Rankin (Stroke Patients Only)       Balance                                    Cognition Arousal/Alertness: Awake/alert Behavior During Therapy: WFL for tasks assessed/performed Overall Cognitive Status: Within Functional Limits for tasks assessed                      Exercises      General Comments        Pertinent Vitals/Pain Pain Assessment: No/denies pain    Home Living                      Prior Function            PT Goals (current goals can now be found in the care plan section) Progress towards PT goals: Progressing toward goals    Frequency  Min 3X/week    PT Plan Discharge plan needs to be updated    Co-evaluation             End of Session   Activity Tolerance: Patient tolerated treatment well Patient left: in chair;with call bell/phone within reach     Time: 1334-1407 PT Time Calculation (min) (ACUTE ONLY): 33 min  Charges:  $Gait Training: 8-22 mins $Therapeutic Activity: 8-22 mins  G Codes:      Jacqualyn Posey 09/04/2014, 3:08 PM  09/04/2014 Jacqualyn Posey PTA [phone] pager [phone] office

## 2014-09-04 NOTE — Progress Notes (Signed)
PROGRESS NOTE  Christy May:[id number] DOB: 04-20-1951 DOA: 09/02/2014 PCP: Cathlean Cower, MD  HPI: Christy May is a 64 year old female with type I uncontrolled diabetes mellitus neuropathy, chronic rhinitis, GERD, and HTN who presented to the [facility] on 09/02/14 with productive cough and shortness of breath with generalized weakness for past 2 weeks. Patient saw Dr. Lamonte Sakai 1 months back for chronic cough and was treated with a course of z pak during that time. She came to the ED 5 days back with similar symptoms . At that time she had cough with whitish phlegm and weakness. She was discharged home but did not feel better and continued to have cough now with greyish sputum and occasionally blood-tinged. She reports dyspnea on exertion and wheezing along with generalized weakness and sweats.. Patient denies headache, dizziness, nausea , vomiting, chest pain, palpitations, abdominal pain, bowel or urinary symptoms. Reports poor appetite. Denies weight loss. In the ED, CXR showed possible vascular congestion.   Subjective: Today, Christy May is feeling better. She has been able to ambulate to and from the bathroom. She is still experiencing a productive cough and reports a red sputum. Reports substernal chest pain which she describes as pressure. Denies nausea, vomiting, diarrhea.   Assessment/Plan: Acute bronchitis with reactive airway disease - Discontinued Solu-Medrol given significant hyperglycemia and replaced with prednisone 30mg , she is no longer wheezing - comfortable on room air  - Patient saw pulmonologist Dr. Lamonte Sakai recently for ongoing cough  - Continue Albuterol nebs, Levaquin, Mucinex, and prednisone.   Acute kidney injury in the setting of CKD stage III based on previous values - Likely in the setting of long standing diabetes - Patient presented with creatinine of 1.52 - Today creatinine 1.42, at baseline  - Continue to monitor  Chronic Rhinitis  - Continue  Singulair, Claritin, and Flonase   Chronic GERD - Continue Pepcid 20mg  daily and Protonix 40mg  BID  Type 1 Diabetes mellitus, uncontrolled with peripheral neuropathy - difficult to control - Started on glucostabilizer 09/03/14 and transitioned to SSI this morning - Patient is on very high doses of Lantus as an outpatient 270 U, just decreased to 100 U due to hypoglycemia. - May benefit from [facility] for long acting, defer to outpatient MD and also depends whether she will be able to get these or whether her insurance will cover. - Diabetes educator consult  Essential hypertension - Patient home BP meds have been held due to AKI - Stable at 129/63  Generalized weakness - Likely contributed by ongoing illness. PT evaluated patient and recommend home PT   Morbid obesity - Counseled on diet and exercise to lose weight.  DVT Prophylaxis:  Heparin   Code Status: Full Family Communication: No family at bedside  Disposition Plan: Remain inpatient   Consultants:  None   Procedures:  Echocardiogram 09/04/14- normal EF, grade 1 diastolic dysfunction  Antibiotics:  Levaquin 09/02/14>>  Objective: Filed Vitals:   09/04/14 0330 09/04/14 0504 09/04/14 0824 09/04/14 1439  BP:  144/68  129/63  Pulse: 66 67 78 78  Temp:  98.2 F (36.8 C)  97.7 F (36.5 C)  TempSrc:  Oral  Oral  Resp: 18 12 16 20   Height:      Weight:      SpO2: 99% 99% 97% 98%    Intake/Output Summary (Last 24 hours) at 09/04/14 1611 Last data filed at 09/04/14 1500  Gross per 24 hour  Intake    740 ml  Output  0 ml  Net    740 ml   Filed Weights   09/02/14 0037  Weight: 149.687 kg (330 lb)   Exam: General: Well developed, well nourished, NAD, appears stated age  29:  Anicteic Sclera Neck: Supple  Cardiovascular: RRR, S1 S2 auscultated, no rubs, murmurs or gallops.   Respiratory: Clear to auscultation bilaterally with equal chest rise- no wheezes, rales, or rhonchi Abdomen: Soft, nontender,  nondistended, + bowel sounds  Extremities: warm dry without cyanosis clubbing, 1+ pitting edema on lower extremities bilaterally  Neuro: AAOx3  Skin: Without rashes exudates or nodules.   Psych: Normal affect and demeanor with intact judgement and insight   Data Reviewed: Basic Metabolic Panel:  Recent Labs Lab 08/29/14 2244 09/02/14 0610 09/03/14 0531 09/04/14 0431  NA 132* 136 133* 139  K 4.2 4.3 4.5 4.5  CL 99 98 99 102  CO2 25 30 28 28   GLUCOSE 298* 229* 474* 147*  BUN 21 20 27* 34*  CREATININE 1.66* 1.52* 1.43* 1.42*  CALCIUM 8.6 9.0 9.0 9.5   Liver Function Tests:  Recent Labs Lab 08/29/14 2244 09/02/14 0610  AST 20 17  ALT 18 16  ALKPHOS 53 53  BILITOT 0.4 0.2*  PROT 6.8 6.7  ALBUMIN 3.1* 2.8*   CBC:  Recent Labs Lab 08/29/14 2244 09/02/14 0610  WBC 6.7 4.8  NEUTROABS 4.6 2.5  HGB 11.3* 11.0*  HCT 35.9* 35.1*  MCV 84.1 84.4  PLT 137* 150   Cardiac Enzymes:  Recent Labs Lab 09/02/14 0610  TROPONINI <0.03   BNP (last 3 results)  Recent Labs  09/02/14 0550  BNP 79.3   CBG:  Recent Labs Lab 09/04/14 0628 09/04/14 0741 09/04/14 0906 09/04/14 1029 09/04/14 1202  GLUCAP 138* 137* 116* 123* 167*    Recent Results (from the past 240 hour(s))  MRSA PCR Screening     Status: None   Collection Time: 09/02/14  7:00 PM  Result Value Ref Range Status   MRSA by PCR NEGATIVE NEGATIVE Final    Comment:        The GeneXpert MRSA Assay (FDA approved for NASAL specimens only), is one component of a comprehensive MRSA colonization surveillance program. It is not intended to diagnose MRSA infection nor to guide or monitor treatment for MRSA infections.      Studies: No results found.  Scheduled Meds: . albuterol  2.5 mg Nebulization Q4H  . aspirin  81 mg Oral QHS  . cholecalciferol  2,000 Units Oral q morning - 10a  . famotidine  20 mg Oral QHS  . fluticasone  2 spray Each Nare q morning - 10a  . gabapentin  800 mg Oral TID  .  guaiFENesin  600 mg Oral BID  . heparin  5,000 Units Subcutaneous 3 times per day  . insulin aspart  0-20 Units Subcutaneous TID WC  . insulin aspart  0-5 Units Subcutaneous QHS  . insulin aspart  6 Units Subcutaneous TID WC  . insulin glargine  100 Units Subcutaneous Daily  . levofloxacin  750 mg Oral Daily  . loratadine  10 mg Oral Daily  . metoprolol  75 mg Oral BID  . montelukast  10 mg Oral QHS  . oxybutynin  10 mg Oral Daily  . pantoprazole  40 mg Oral BID  . predniSONE  30 mg Oral Q breakfast   Continuous Infusions:    Principal Problem:   Acute bronchitis Active Problems:   Morbid obesity   Essential hypertension  GERD   Type 1 diabetes mellitus with neurological manifestations, uncontrolled   Acute kidney injury   Acute upper respiratory infection   Weakness generalized   Raspect, Erin, PA-S [facility] 09/04/2014, 4:11 PM   Trinity Haun M. Cruzita Lederer, MD [facility] [phone]

## 2014-09-04 NOTE — Progress Notes (Signed)
  Echocardiogram 2D Echocardiogram has been performed.  Lysle Rubens 09/04/2014, 12:36 PM

## 2014-09-05 ENCOUNTER — Ambulatory Visit: Payer: Medicare Other | Admitting: Podiatrist

## 2014-09-05 DIAGNOSIS — E1041 Type 1 diabetes mellitus with diabetic mononeuropathy: Secondary | ICD-10-CM | POA: Diagnosis not present

## 2014-09-05 DIAGNOSIS — J209 Acute bronchitis, unspecified: Secondary | ICD-10-CM | POA: Diagnosis not present

## 2014-09-05 LAB — GLUCOSE, CAPILLARY
Glucose-Capillary: 176 mg/dL — ABNORMAL HIGH (ref 70–99)
Glucose-Capillary: 243 mg/dL — ABNORMAL HIGH (ref 70–99)

## 2014-09-05 LAB — BASIC METABOLIC PANEL
Anion gap: 9 (ref 5–15)
BUN: 27 mg/dL — ABNORMAL HIGH (ref 6–23)
CHLORIDE: 101 mmol/L (ref 96–112)
CO2: 28 mmol/L (ref 19–32)
Calcium: 9.8 mg/dL (ref 8.4–10.5)
Creatinine, Ser: 1.35 mg/dL — ABNORMAL HIGH (ref 0.50–1.10)
GFR, EST AFRICAN AMERICAN: 47 mL/min — AB (ref >90)
GFR, EST NON AFRICAN AMERICAN: 41 mL/min — AB (ref >90)
GLUCOSE: 192 mg/dL — AB (ref 70–99)
Potassium: 4.4 mmol/L (ref 3.5–5.1)
Sodium: 138 mmol/L (ref 135–145)

## 2014-09-05 MED ORDER — PREDNISONE 10 MG PO TABS: 20.0000 mg | ORAL_TABLET | Freq: Every day | ORAL | Status: DC

## 2014-09-05 MED ORDER — HYDROCODONE-HOMATROPINE 5-1.5 MG/5ML PO SYRP: 5.0000 mL | ORAL_SOLUTION | Freq: Four times a day (QID) | ORAL | Status: DC | PRN

## 2014-09-05 MED ORDER — LEVOFLOXACIN 750 MG PO TABS: 750.0000 mg | ORAL_TABLET | Freq: Every day | ORAL | Status: DC

## 2014-09-05 MED ORDER — INSULIN GLARGINE 100 UNIT/ML SOLOSTAR PEN: 100.0000 [IU] | PEN_INJECTOR | SUBCUTANEOUS | Status: DC

## 2014-09-05 NOTE — Progress Notes (Signed)
Patient's oxygen saturations at rest on room air was 95-96%.  While ambulating patient's oxygen saturation maintained between 94-96% ra. Patient's  oxygen saturation did drop once to 88% ra then came up and maintained between 94%-96% ra while ambulating.

## 2014-09-05 NOTE — Telephone Encounter (Signed)
Left message for call back.

## 2014-09-05 NOTE — Discharge Summary (Signed)
Physician Discharge Summary  Christy May [facility]:[id number] DOB: 1951-05-08 DOA: 09/02/2014  PCP: Cathlean Cower, MD  Admit date: 09/02/2014 Discharge date: 09/05/2014  Time spent: 45 minutes  Recommendations for Outpatient Follow-up:  1. Follow up with Dr. Jeneen Rinks in 2-4 weeks 2. Follow up with Dr. Loanne Drilling in 2-4 weeks   Discharge Diagnoses:  Principal Problem:   Acute bronchitis Active Problems:   Morbid obesity   Essential hypertension   GERD   Type 1 diabetes mellitus with neurological manifestations, uncontrolled   Acute kidney injury   Acute upper respiratory infection   Weakness generalized  Discharge Condition: stable  Diet recommendation: diabetic   Filed Weights   09/02/14 0037  Weight: 149.687 kg (330 lb)   History of present illness:  64 year old morbidly obese female with type I uncontrolled diabetes mellitus neuropathy, chronic rhinitis , GERD, vocal cord dysfunction ( following with outpt speech therapy), essential hypertension who presented to the ED with productive cough and shortness of breath with generalized weakness for past 2 weeks. Patient saw Dr. Lamonte Sakai 1 months back for chronic cough and was treated with a course of z pak during that time. She came to the ED 5 days back with similar symptoms . At that time she had cough with whitish phlegm and weakness. She was discharged home but did not feel better and continued to have cough now with greyish sputum and occasionally blood-tinged. She reports dyspnea on exertion and wheezing along with generalized weakness and sweats.. Patient denies headache, dizziness, nausea , vomiting, chest pain, palpitations, abdominal pain, bowel or urinary symptoms. Reports poor appetite. Denies weight loss.  Hospital Course:  Acute bronchitis with reactive airway disease - patient was admitted to the hospital and placed on IV Solu-Medrol, Levofloxacin and nebulizers. Her respiratory status improved under this regimen and her  steroids were converted to prednisone on 3/15. She has remained stable, on room air, ambulating without significant difficulties and was discharged home in stable condition and will complete 4 additional days of Levaquin and a short prednisone taper at a low dose to prevent significant hyperglycemia.  Acute kidney injury in the setting of CKD stage III based - Likely in the setting of long standing diabetes, her renal function has remained stable and creatinine on discharge was 1.35 Type 1 Diabetes mellitus, uncontrolled with peripheral neuropathy - difficult to control, patient was on 300 U Lantus as an outpatient, decreased to 270 and currently decreased to 100 U daily and she is also using 30 U of Novolog with supper. Her sugars were difficult to control while on the IV Solu-medrol and required glucostabilizer on 3/14. Patient shows good understanding of her DM and willingness to get things under control. I discussed with her extensively that she may benefit from having mealtime insulin three times daily with Novolog and that may result in lower Lantus needs. She currently seems to have multiple lows during the day at home, which likely prevents her from being able to diet and lose weight. She will discuss this further with Dr. Loanne Drilling. On discharge, no changes were made to her current regimen. Morbid obesity - Counseled on diet and exercise to lose weight, she is interested in Bariatric surgery, will need outpatient referral.   Procedures:  None    Consultations:  None   Discharge Exam: Filed Vitals:   09/05/14 0618 09/05/14 0813 09/05/14 0830 09/05/14 1319  BP: 145/67  137/71 152/72  Pulse: 77  80 78  Temp: 97.3 F (36.3 C)  97.7  F (36.5 C) 97.8 F (36.6 C)  TempSrc: Oral  Oral Oral  Resp: 18  18 18   Height:      Weight:      SpO2: 97% 100% 100% 98%    General: NAD Cardiovascular: RRR Respiratory: CTA biL  Discharge Instructions     Medication List    TAKE these  medications        albuterol (2.5 MG/3ML) 0.083% nebulizer solution  Commonly known as:  PROVENTIL  Take 2.5 mg by nebulization every 6 (six) hours as needed for wheezing or shortness of breath.     aspirin 81 MG tablet  Take 81 mg by mouth at bedtime.     benzonatate 100 MG capsule  Commonly known as:  TESSALON  Take 1 capsule (100 mg total) by mouth 3 (three) times daily as needed for cough.     cholecalciferol 1000 UNITS tablet  Commonly known as:  VITAMIN D  Take 2,000 Units by mouth every morning.     ciprofloxacin 0.3 % ophthalmic solution  Commonly known as:  CILOXAN  Place 2 drops into the right eye daily. Administer 1 drop, every 2 hours, while awake, for 2 days. Then 1 drop, every 4 hours, while awake, for the next 5 days.     cyclobenzaprine 5 MG tablet  Commonly known as:  FLEXERIL  TAKE ONE TABLET BY MOUTH THREE TIMES DAILY AS NEEDED FOR MUSCLE SPASMS     diclofenac sodium 1 % Gel  Commonly known as:  VOLTAREN  Apply 4 g topically 4 (four) times daily as needed (right hand, arm and neck pain.).     esomeprazole 40 MG capsule  Commonly known as:  NEXIUM  Take 1 capsule (40 mg total) by mouth 2 (two) times daily.     famotidine 20 MG tablet  Commonly known as:  PEPCID  TAKE 1 TABLET BY MOUTH EVERY NIGHT AT BEDTIME     fluticasone 50 MCG/ACT nasal spray  Commonly known as:  FLONASE  Place 2 sprays into both nostrils every morning.     gabapentin 800 MG tablet  Commonly known as:  NEURONTIN  TAKE 1 TABLET BY MOUTH THREE TIMES DAILY     glucose blood test strip  Commonly known as:  ONE TOUCH ULTRA TEST  Use to test blood sugars twice a day. Dx 250.63     HYDROcodone-homatropine 5-1.5 MG/5ML syrup  Commonly known as:  HYCODAN  Take 5 mLs by mouth every 6 (six) hours as needed for cough.     Insulin Glargine 100 UNIT/ML Solostar Pen  Commonly known as:  LANTUS  Inject 100 Units into the skin every morning. And pen needles 2/day     insulin lispro 100  UNIT/ML KiwkPen  Commonly known as:  HUMALOG KWIKPEN  Inject 0.3 mLs (30 Units total) into the skin daily with supper.     irbesartan 300 MG tablet  Commonly known as:  AVAPRO  TAKE 1 TABLET BY MOUTH DAILY     levofloxacin 750 MG tablet  Commonly known as:  LEVAQUIN  Take 1 tablet (750 mg total) by mouth daily.     loratadine 10 MG tablet  Commonly known as:  CLARITIN  TAKE 1 TABLET BY MOUTH EVERY DAY     metoprolol 50 MG tablet  Commonly known as:  LOPRESSOR  Take 75 mg by mouth 2 (two) times daily.     montelukast 10 MG tablet  Commonly known as:  SINGULAIR  Take 10 mg  by mouth at bedtime.     nystatin cream  Commonly known as:  MYCOSTATIN  Apply to affected area 2 times daily     ondansetron 4 MG disintegrating tablet  Commonly known as:  ZOFRAN ODT  Take 1 tablet (4 mg total) by mouth every 8 (eight) hours as needed for nausea or vomiting.     onetouch ultrasoft lancets  Use to test blood sugars twice a day.     oxybutynin 10 MG 24 hr tablet  Commonly known as:  DITROPAN-XL  TAKE 1 TABLET BY MOUTH DAILY     predniSONE 10 MG tablet  Commonly known as:  DELTASONE  Take 2 tablets (20 mg total) by mouth daily with breakfast.     promethazine 25 MG tablet  Commonly known as:  PHENERGAN  TAKE 1 TABLET BY MOUTH EVERY 6 HOURS AS NEEDED FOR NAUSEA AND VOMITING           Follow-up Information    Follow up with Cathlean Cower, MD In 1 month.   Specialties:  Internal Medicine, Radiology   Contact information:   [place] [address] [phone]       Follow up with Renato Shin, MD. Schedule an appointment as soon as possible for a visit in 1 week.   Specialty:  Endocrinology   Contact information:   [address] [place] [address] [phone]       The results of significant diagnostics from this hospitalization (including imaging, microbiology, ancillary and laboratory) are listed below for reference.     Significant Diagnostic Studies: Dg Chest 2 View  09/02/2014   CLINICAL DATA:  Weakness.  EXAM: CHEST  2 VIEW  COMPARISON:  08/29/2014  FINDINGS: The heart is enlarged, unchanged basilar assessment is limited due to soft tissue attenuation from body habitus. Central vascular congestion is suspected. No evidence of confluent airspace disease. No large pleural effusion.  IMPRESSION: Cardiomegaly.  Suspect vascular congestion.   Electronically Signed   By: Jeb Levering M.D.   On: 09/02/2014 05:50   Dg Chest 2 View  08/29/2014   CLINICAL DATA:  Productive cough for 6 days with shortness of breath.  EXAM: CHEST  2 VIEW  COMPARISON:  05/23/2014  FINDINGS: The study is limited due to patient's body habitus. Specifically, the AP view is very difficult to interpret due to the overlying soft tissues. Difficult to exclude enlargement of the cardiac silhouette. There are low lung volumes. No evidence for pleural effusions on the lateral view. Prominent central vascular structures and cannot exclude mild edema. Surgical plate in the cervical spine.  IMPRESSION: Limited examination due to body habitus.  Prominent central vascular structures and cannot exclude mild edema.   Electronically Signed   By: Markus Daft M.D.   On: 08/29/2014 23:23   Ct Abdomen Pelvis W Contrast  08/30/2014   CLINICAL DATA:  Generalized weakness and abdominal pain since yesterday. Productive cough.  EXAM: CT ABDOMEN AND PELVIS WITH CONTRAST  TECHNIQUE: Multidetector CT imaging of the abdomen and pelvis was performed using the standard protocol following bolus administration of intravenous contrast.  CONTRAST:  80 mL Omnipaque 300 IV  COMPARISON:  CT 04/17/2012  FINDINGS: The heart is enlarged. There is atelectasis at the lung bases. Patient motion limits evaluation.  There is hepatomegaly. No focal hepatic lesion. Patient is postcholecystectomy. Dilatation of the common bile duct appear similar to prior exam, less well-defined currently due  to motion. The spleen,  pancreas, and adrenal glands are normal. Kidneys demonstrate symmetric enhancement and excretion. No hydronephrosis. Small hypodensities within both kidneys unchanged, likely cysts.  The stomach is physiologically distended. There are no dilated or thickened bowel loops. The colon is redundant without localizing abnormality. No colonic wall thickening. Moderate stool throughout. The appendix is not definitively identified. No free air, free fluid, or intra-abdominal fluid collection.  The abdominal aorta is normal in caliber with mild atherosclerosis. No retroperitoneal adenopathy.  Within the pelvis the urinary bladder is physiologically distended. Uterus is not seen presumably surgically absent. No adnexal mass. No pelvic free fluid. Small external iliac lymph nodes are unchanged from prior exam. No pelvic free fluid.  Multilevel facet arthropathy throughout the lumbar spine. No acute or suspicious osseous abnormality.  IMPRESSION: 1. No acute abnormality in the abdomen/pelvis. 2. Stable chronic findings include hepatomegaly, biliary dilatation postcholecystectomy, and small renal cysts.   Electronically Signed   By: Jeb Levering M.D.   On: 08/30/2014 01:00    Microbiology: Recent Results (from the past 240 hour(s))  MRSA PCR Screening     Status: None   Collection Time: 09/02/14  7:00 PM  Result Value Ref Range Status   MRSA by PCR NEGATIVE NEGATIVE Final    Comment:        The GeneXpert MRSA Assay (FDA approved for NASAL specimens only), is one component of a comprehensive MRSA colonization surveillance program. It is not intended to diagnose MRSA infection nor to guide or monitor treatment for MRSA infections.      Labs: Basic Metabolic Panel:  Recent Labs Lab 08/29/14 2244 09/02/14 0610 09/03/14 0531 09/04/14 0431 09/05/14 0628  NA 132* 136 133* 139 138  K 4.2 4.3 4.5 4.5 4.4  CL 99 98 99 102 101  CO2 25 30 28 28 28   GLUCOSE 298* 229* 474* 147*  192*  BUN 21 20 27* 34* 27*  CREATININE 1.66* 1.52* 1.43* 1.42* 1.35*  CALCIUM 8.6 9.0 9.0 9.5 9.8   Liver Function Tests:  Recent Labs Lab 08/29/14 2244 09/02/14 0610  AST 20 17  ALT 18 16  ALKPHOS 53 53  BILITOT 0.4 0.2*  PROT 6.8 6.7  ALBUMIN 3.1* 2.8*   CBC:  Recent Labs Lab 08/29/14 2244 09/02/14 0610  WBC 6.7 4.8  NEUTROABS 4.6 2.5  HGB 11.3* 11.0*  HCT 35.9* 35.1*  MCV 84.1 84.4  PLT 137* 150   Cardiac Enzymes:  Recent Labs Lab 09/02/14 0610 09/04/14 2109  TROPONINI <0.03 <0.03   BNP: BNP (last 3 results)  Recent Labs  09/02/14 0550  BNP 79.3    CBG:  Recent Labs Lab 09/04/14 1714 09/04/14 1845 09/04/14 2306 09/05/14 0706 09/05/14 1133  GLUCAP 409* 416* 207* 176* 243*    Signed:  GHERGHE, COSTIN  [facility] 09/05/2014, 2:52 PM

## 2014-09-05 NOTE — Progress Notes (Signed)
ANTIBIOTIC CONSULT NOTE - Follow-up  Pharmacy Consult for levaquin Indication: acute bronchitis  Allergies  Allergen Reactions  . Ace Inhibitors Anaphylaxis  . Penicillins Hives and Rash  . Tape Hives    Paper tape is ok to use  . Adhesive [Tape] Rash    Patient Measurements: Height: 5\' 4"  (162.6 cm) Weight: (!) 330 lb (149.687 kg) IBW/kg (Calculated) : 54.7  Vital Signs: Temp: 97.3 F (36.3 C) (03/16 0618) Temp Source: Oral (03/16 0618) BP: 145/67 mmHg (03/16 0618) Pulse Rate: 77 (03/16 0618) Intake/Output from previous day: 03/15 0701 - 03/16 0700 In: 420.8 [I.V.:420.8] Out: -  Intake/Output from this shift:    Labs:  Recent Labs  09/03/14 0531 09/04/14 0431  CREATININE 1.43* 1.42*   Estimated Creatinine Clearance: 58.6 mL/min (by C-G formula based on Cr of 1.42). No results for input(s): VANCOTROUGH, VANCOPEAK, VANCORANDOM, GENTTROUGH, GENTPEAK, GENTRANDOM, TOBRATROUGH, TOBRAPEAK, TOBRARND, AMIKACINPEAK, AMIKACINTROU, AMIKACIN in the last 72 hours.   Microbiology: Recent Results (from the past 720 hour(s))  MRSA PCR Screening     Status: None   Collection Time: 09/02/14  7:00 PM  Result Value Ref Range Status   MRSA by PCR NEGATIVE NEGATIVE Final    Comment:        The GeneXpert MRSA Assay (FDA approved for NASAL specimens only), is one component of a comprehensive MRSA colonization surveillance program. It is not intended to diagnose MRSA infection nor to guide or monitor treatment for MRSA infections.    Medications:  Anti-infectives    Start     Dose/Rate Route Frequency Ordered Stop   09/04/14 1000  levofloxacin (LEVAQUIN) tablet 750 mg     750 mg Oral Daily 09/04/14 0754     09/03/14 1800  fluconazole (DIFLUCAN) tablet 100 mg     100 mg Oral  Once 09/03/14 1755 09/03/14 1900   09/02/14 1030  levofloxacin (LEVAQUIN) IVPB 750 mg  Status:  Discontinued     750 mg 100 mL/hr over 90 Minutes Intravenous Every 24 hours 09/02/14 0937 09/04/14 0754      Assessment: 10 yof presented to the hospital with SOB and generalized weakness. Started on empiric levaquin for treatment of acute bronchitis. Pt remains afebrile and WBC is WNL. Scr is elevated at 1.42 but stable. Levaquin changed to PO yesterday. Today is D#4 of therapy.    Levaquin 3/13>>  Goal of Therapy:  Eradication of infection  Plan:  - Continue Levaquin 750mg  PO Q24H - MD - Please address planned LOT  *Pharmacy will sign-off as there are no anticipated dose changes. Thank you for the consult!  Haleem Hanner, Rande Lawman 09/05/2014,8:01 AM

## 2014-09-05 NOTE — Discharge Instructions (Signed)
Follow with Christy John, MD in 5-7 days ° °Please get a complete blood count and chemistry panel checked by your Primary MD at your next visit, and again as instructed by your Primary MD. Please get your medications reviewed and adjusted by your Primary MD. ° °Please request your Primary MD to go over all Hospital Tests and Procedure/Radiological results at the follow up, please get all Hospital records sent to your Prim MD by signing hospital release before you go home. ° °If you had Pneumonia of Lung problems at the Hospital: °Please get a 2 view Chest X ray done in 6-8 weeks after hospital discharge or sooner if instructed by your Primary MD. ° °If you have Congestive Heart Failure: °Please call your Cardiologist or Primary MD anytime you have any of the following symptoms:  °1) 3 pound weight gain in 24 hours or 5 pounds in 1 week  °2) shortness of breath, with or without a dry hacking cough  °3) swelling in the hands, feet or stomach  °4) if you have to sleep on extra pillows at night in order to breathe ° °Follow cardiac low salt diet and 1.5 lit/day fluid restriction. ° °If you have diabetes °Accuchecks 4 times/day, Once in AM empty stomach and then before each meal. °Log in all results and show them to your primary doctor at your next visit. °If any glucose reading is under 80 or above 300 call your primary MD immediately. ° °If you have Seizure/Convulsions/Epilepsy: °Please do not drive, operate heavy machinery, participate in activities at heights or participate in high speed sports until you have seen by Primary MD or a Neurologist and advised to do so again. ° °If you had Gastrointestinal Bleeding: °Please ask your Primary MD to check a complete blood count within one week of discharge or at your next visit. Your endoscopic/colonoscopic biopsies that are pending at the time of discharge, will also need to followed by your Primary MD. ° °Get Medicines reviewed and adjusted. °Please take all your  medications with you for your next visit with your Primary MD ° °Please request your Primary MD to go over all hospital tests and procedure/radiological results at the follow up, please ask your Primary MD to get all Hospital records sent to his/her office. ° °If you experience worsening of your admission symptoms, develop shortness of breath, life threatening emergency, suicidal or homicidal thoughts you must seek medical attention immediately by calling 911 or calling your MD immediately  if symptoms less severe. ° °You must read complete instructions/literature along with all the possible adverse reactions/side effects for all the Medicines you take and that have been prescribed to you. Take any new Medicines after you have completely understood and accpet all the possible adverse reactions/side effects.  ° °Do not drive or operate heavy machinery when taking Pain medications.  ° °Do not take more than prescribed Pain, Sleep and Anxiety Medications ° °Special Instructions: If you have smoked or chewed Tobacco  in the last 2 yrs please stop smoking, stop any regular Alcohol  and or any Recreational drug use. ° °Wear Seat belts while driving. ° °Please note °You were cared for by a hospitalist during your hospital stay. If you have any questions about your discharge medications or the care you received while you were in the hospital after you are discharged, you can call the unit and asked to speak with the hospitalist on call if the hospitalist that took care of you is not available. Once   you are discharged, your primary care physician will handle any further medical issues. Please note that NO REFILLS for any discharge medications will be authorized once you are discharged, as it is imperative that you return to your primary care physician (or establish a relationship with a primary care physician if you do not have one) for your aftercare needs so that they can reassess your need for medications and monitor your  lab values.  You can reach the hospitalist office at phone [phone] or fax [phone]   If you do not have a primary care physician, you can call [phone] for a physician referral.  Activity: As tolerated with Full fall precautions use walker/cane & assistance as needed  Diet: diabetic  Disposition Home

## 2014-09-05 NOTE — Progress Notes (Signed)
AVS discharge instructions were reviewed with patient. Patient stated that she did not have any questions. Prescriptions for levaquin, hycodan, and prednisone were given to patient to take to her pharmacy. Staff assisted patient by wheelchair to her transportation.

## 2014-09-07 NOTE — Telephone Encounter (Signed)
Ok for verbal order  °

## 2014-09-07 NOTE — Telephone Encounter (Signed)
Patient  Stated that [facility] need an order to get home attendant care. Please advise

## 2014-09-10 ENCOUNTER — Other Ambulatory Visit: Payer: Self-pay | Admitting: *Deleted

## 2014-09-10 DIAGNOSIS — E669 Obesity, unspecified: Secondary | ICD-10-CM | POA: Diagnosis not present

## 2014-09-10 DIAGNOSIS — N189 Chronic kidney disease, unspecified: Secondary | ICD-10-CM | POA: Diagnosis not present

## 2014-09-10 DIAGNOSIS — G4733 Obstructive sleep apnea (adult) (pediatric): Secondary | ICD-10-CM | POA: Diagnosis not present

## 2014-09-10 DIAGNOSIS — E1042 Type 1 diabetes mellitus with diabetic polyneuropathy: Secondary | ICD-10-CM | POA: Diagnosis not present

## 2014-09-10 DIAGNOSIS — J441 Chronic obstructive pulmonary disease with (acute) exacerbation: Secondary | ICD-10-CM | POA: Diagnosis not present

## 2014-09-10 DIAGNOSIS — D649 Anemia, unspecified: Secondary | ICD-10-CM | POA: Diagnosis not present

## 2014-09-10 DIAGNOSIS — I129 Hypertensive chronic kidney disease with stage 1 through stage 4 chronic kidney disease, or unspecified chronic kidney disease: Secondary | ICD-10-CM | POA: Diagnosis not present

## 2014-09-10 DIAGNOSIS — J45901 Unspecified asthma with (acute) exacerbation: Secondary | ICD-10-CM | POA: Diagnosis not present

## 2014-09-10 NOTE — Patient Outreach (Signed)
[facility] Social Work  09/10/2014   Christy May 02-06-1951 [id number]  Subjective:  Objective:  Current Medications: Current Outpatient Prescriptions  Medication Sig Dispense Refill  . albuterol (PROVENTIL) (2.5 MG/3ML) 0.083% nebulizer solution Take 2.5 mg by nebulization every 6 (six) hours as needed for wheezing or shortness of breath.    Marland Kitchen aspirin 81 MG tablet Take 81 mg by mouth at bedtime.     . benzonatate (TESSALON) 100 MG capsule Take 1 capsule (100 mg total) by mouth 3 (three) times daily as needed for cough. 21 capsule 0  . cholecalciferol (VITAMIN D) 1000 UNITS tablet Take 2,000 Units by mouth every morning.     . ciprofloxacin (CILOXAN) 0.3 % ophthalmic solution Place 2 drops into the right eye daily. Administer 1 drop, every 2 hours, while awake, for 2 days. Then 1 drop, every 4 hours, while awake, for the next 5 days.    . cyclobenzaprine (FLEXERIL) 5 MG tablet TAKE ONE TABLET BY MOUTH THREE TIMES DAILY AS NEEDED FOR MUSCLE SPASMS 90 tablet 0  . diclofenac sodium (VOLTAREN) 1 % GEL Apply 4 g topically 4 (four) times daily as needed (right hand, arm and neck pain.).    Marland Kitchen esomeprazole (NEXIUM) 40 MG capsule Take 1 capsule (40 mg total) by mouth 2 (two) times daily. 60 capsule 3  . famotidine (PEPCID) 20 MG tablet TAKE 1 TABLET BY MOUTH EVERY NIGHT AT BEDTIME 30 tablet 0  . fluticasone (FLONASE) 50 MCG/ACT nasal spray Place 2 sprays into both nostrils every morning. 16 g 6  . gabapentin (NEURONTIN) 800 MG tablet TAKE 1 TABLET BY MOUTH THREE TIMES DAILY 270 tablet 1  . glucose blood (ONE TOUCH ULTRA TEST) test strip Use to test blood sugars twice a day. Dx 250.63 100 each 12  . HYDROcodone-homatropine (HYCODAN) 5-1.5 MG/5ML syrup Take 5 mLs by mouth every 6 (six) hours as needed for cough. 180 mL 0  . Insulin Glargine (LANTUS) 100 UNIT/ML Solostar Pen Inject 100 Units into the skin every morning. And pen needles 2/day 30 pen 2  . insulin lispro (HUMALOG KWIKPEN) 100  UNIT/ML KiwkPen Inject 0.3 mLs (30 Units total) into the skin daily with supper. 15 mL 11  . irbesartan (AVAPRO) 300 MG tablet TAKE 1 TABLET BY MOUTH DAILY 90 tablet 0  . Lancets (ONETOUCH ULTRASOFT) lancets Use to test blood sugars twice a day. 100 each 12  . levofloxacin (LEVAQUIN) 750 MG tablet Take 1 tablet (750 mg total) by mouth daily. 4 tablet 0  . loratadine (CLARITIN) 10 MG tablet TAKE 1 TABLET BY MOUTH EVERY DAY 30 tablet 2  . metoprolol (LOPRESSOR) 50 MG tablet Take 75 mg by mouth 2 (two) times daily.    . montelukast (SINGULAIR) 10 MG tablet Take 10 mg by mouth at bedtime.    Marland Kitchen nystatin cream (MYCOSTATIN) Apply to affected area 2 times daily (Patient taking differently: Apply to affected area 2 times daily PRN) 30 g 0  . ondansetron (ZOFRAN ODT) 4 MG disintegrating tablet Take 1 tablet (4 mg total) by mouth every 8 (eight) hours as needed for nausea or vomiting. 20 tablet 0  . oxybutynin (DITROPAN-XL) 10 MG 24 hr tablet TAKE 1 TABLET BY MOUTH DAILY 90 tablet 1  . predniSONE (DELTASONE) 10 MG tablet Take 2 tablets (20 mg total) by mouth daily with breakfast. 9 tablet 0  . promethazine (PHENERGAN) 25 MG tablet TAKE 1 TABLET BY MOUTH EVERY 6 HOURS AS NEEDED FOR NAUSEA AND VOMITING  30 tablet 0   No current facility-administered medications for this visit.    Functional Status: In your present state of health, do you have any difficulty performing the following activities: 09/03/2014  Is the patient deaf or have difficulty hearing? N  Hearing Y  Vision N  Difficulty concentrating or making decisions Y  Walking or climbing stairs? N  Doing errands, shopping? Y    Fall/Depression Screening: PHQ 2/9 Scores 04/19/2014 04/19/2014  PHQ - 2 Score 0 1    Assessment: Discharge visit.  Patient recently discharged from the hospital 09-05-14  Patient to receive home health services through [facility].  Physical therapist from [facility] up her initial assessment when  this social worker arrived to patient's home.  Patient to receive a bath aid, speech therapy and occupational therapy (physical therapist will request order  today for occupational therapy) This social worker contacted Department of [place], [facility] eligibility worker Christy May [phone].  Per  Ms. Seymore patient does not meet criteria  for full or partial medicaid.  Patient has rescheduled her appointment with [employer] scheduled for 09/12/14 at 3:30pm to assist with budgeting needs.  Patient to complete intake information packet before appointment.  [place] during visit [phone]- available medicare and supplemental plans reviewed. Patient currently receives low income subsidy.  Plan: Patient has no further social work needs at this time. Patient's chart to be closed to social work.  Patient instructed to call [facility] should any future social work needs arise.

## 2014-09-10 NOTE — Telephone Encounter (Signed)
Can not give verbal order place order for home health with advance...Johny Chess

## 2014-09-10 NOTE — Patient Instructions (Addendum)
Contact numbers:  Department of Social Kerin Perna [facility] [place] [phone] [employer] [phone]

## 2014-09-11 ENCOUNTER — Ambulatory Visit (INDEPENDENT_AMBULATORY_CARE_PROVIDER_SITE_OTHER): Payer: Medicare Other | Admitting: Endocrinology

## 2014-09-11 ENCOUNTER — Encounter: Payer: Self-pay | Admitting: Endocrinology

## 2014-09-11 VITALS — BP 132/84 | HR 82 | Temp 98.0°F | Ht 64.0 in | Wt 309.0 lb

## 2014-09-11 DIAGNOSIS — D649 Anemia, unspecified: Secondary | ICD-10-CM | POA: Diagnosis not present

## 2014-09-11 DIAGNOSIS — I129 Hypertensive chronic kidney disease with stage 1 through stage 4 chronic kidney disease, or unspecified chronic kidney disease: Secondary | ICD-10-CM | POA: Diagnosis not present

## 2014-09-11 DIAGNOSIS — E1042 Type 1 diabetes mellitus with diabetic polyneuropathy: Secondary | ICD-10-CM | POA: Diagnosis not present

## 2014-09-11 DIAGNOSIS — J45901 Unspecified asthma with (acute) exacerbation: Secondary | ICD-10-CM | POA: Diagnosis not present

## 2014-09-11 DIAGNOSIS — E1041 Type 1 diabetes mellitus with diabetic mononeuropathy: Secondary | ICD-10-CM | POA: Diagnosis not present

## 2014-09-11 DIAGNOSIS — M25511 Pain in right shoulder: Secondary | ICD-10-CM

## 2014-09-11 DIAGNOSIS — J441 Chronic obstructive pulmonary disease with (acute) exacerbation: Secondary | ICD-10-CM | POA: Diagnosis not present

## 2014-09-11 DIAGNOSIS — E1065 Type 1 diabetes mellitus with hyperglycemia: Principal | ICD-10-CM

## 2014-09-11 DIAGNOSIS — M25519 Pain in unspecified shoulder: Secondary | ICD-10-CM | POA: Insufficient documentation

## 2014-09-11 DIAGNOSIS — E1049 Type 1 diabetes mellitus with other diabetic neurological complication: Secondary | ICD-10-CM

## 2014-09-11 DIAGNOSIS — IMO0002 Reserved for concepts with insufficient information to code with codable children: Secondary | ICD-10-CM

## 2014-09-11 DIAGNOSIS — G4733 Obstructive sleep apnea (adult) (pediatric): Secondary | ICD-10-CM | POA: Diagnosis not present

## 2014-09-11 MED ORDER — INSULIN NPH (HUMAN) (ISOPHANE) 100 UNIT/ML ~~LOC~~ SUSP: 130.0000 [IU] | SUBCUTANEOUS | Status: DC

## 2014-09-11 NOTE — Patient Instructions (Addendum)
Please change both current insulins to NPH, 130 units each morning.  check your blood sugar twice a day.  vary the time of day when you check, between before the 3 meals, and at bedtime.  also check if you have symptoms of your blood sugar being too high or too low.  please keep a record of the readings and bring it to your next appointment here.  You can write it on any piece of paper.  please call us sooner if your blood sugar goes below 70, or if you have a lot of readings over 200.  As you are off the steroids, and continue to feel better, you insulin needs will change, so please call if it is off.  X-rays are requested for you today.  We'll let you know about the results.  Please let Dr Jenny Reichmann know if you want to see the orthopedic specialist.   When you feel better, please continue to pursue the weight loss surgery.

## 2014-09-11 NOTE — Progress Notes (Signed)
Subjective:    Patient ID: Christy May, female    DOB: 03-13-1951, 64 y.o.   MRN: [id number]  HPI Pt returns for f/u of diabetes mellitus: DM type: Insulin-requiring type 2 Dx'ed: 5784 Complications: none Therapy: insulin since 2002.  GDM: never DKA: never Severe hypoglycemia: never Pancreatitis: never Other: she was changed to a simpler qd insulin regimen, after poor results on multiple daily injections; she wants the cheapest possible insulin, but she did poorly on NPH;  Interval history: She is still pursuing weight loss surgery.  Pt was in the hospital last week for bronchitis, and received steroids.  She finished steroids yesterday. She takes lantus 100 units/d, but she can no longer afford it.  no cbg record, but states cbg's are well-controlled.   She has moderate pain at the right shoulder, but no assoc numbness.   Past Medical History  Diagnosis Date  . Anemia, unspecified   . Dehydration   . Depressive disorder, not elsewhere classified   . Esophageal reflux   . Unspecified essential hypertension   . Unspecified menopausal and postmenopausal disorder   . Obesity, unspecified   . Inflammatory and toxic neuropathy, unspecified   . Unspecified vitamin D deficiency   . Dysphagia 2007    historyof dysphagia with severe dysmotility by barium swallow-[name]  . Obesity   . Hypertension   . Allergic bronchitis   . Complication of anesthesia     "I've had recall; I probably stopped breathing at least 2 times" (09/03/2014)  . Family history of adverse reaction to anesthesia     "my brother was told they lost him a couple times during OR"  . OSA (obstructive sleep apnea)     "stopped using my CPAP; just couldn't use it cause it kept me awake and I couldn't keep it on my face'"(09/03/2014)  . Heart murmur   . Chronic airway obstruction, not elsewhere classified     "I was told I don't have this/tests done @ [facility] 05/2014"  . Extrinsic asthma, unspecified     "I  was told I don't have this/tests done @ [facility] 05/2014"  . Type II diabetes mellitus   . History of hiatal hernia   . Headache     "weekly to monthly" (09/03/2014)  . Migraine     "couple times/month right now" (09/03/2014)  . Spondylosis of unspecified site without mention of myelopathy   . Arthritis     "neck, back, hands" (09/03/2014)  . Diabetic peripheral neuropathy   . Chronic back pain     Past Surgical History  Procedure Laterality Date  . Refractive surgery Bilateral   . Anterior cervical decomp/discectomy fusion      multiple cervical spine levels;Dr. Arnoldo Morale  . Incision and drainage abscess      Chest  . Dilation and curettage of uterus    . Exploratory laparotomy    . Total knee arthroplasty Left 10/2003  . Total knee arthroplasty Right 01/2004    [name]  . Foot surgery Right X 2    "took blood out of my arms and put platelets in my feet"  . Appendectomy    . Tonsillectomy    . Esophagogastroduodenoscopy N/A 05/08/2014    Procedure: ESOPHAGOGASTRODUODENOSCOPY (EGD);  Surgeon: [name], MD;  Location: [name];  Service: Endoscopy;  Laterality: N/A;  . Colonoscopy N/A 05/08/2014    Procedure: COLONOSCOPY;  Surgeon: [name], MD;  Location: [facility] ENDOSCOPY;  Service: Endoscopy;  Laterality: N/A;  .  Back surgery    . Cholecystectomy open    . Abdominal hysterectomy      "partial"  . Eye surgery    . Joint replacement      History   Social History  . Marital Status: Widowed    Spouse Name: N/A  . Number of Children: N/A  . Years of Education: N/A   Occupational History  . disabled    Social History Main Topics  . Smoking status: Former Smoker -- 1.00 packs/day for 20 years    Types: Cigarettes    Quit date: 06/22/1986  . Smokeless tobacco: Never Used  . Alcohol Use: Yes     Comment: "stopped drinking in ~ 1983"  . Drug Use: No  . Sexual Activity: No   Other Topics Concern  . Not on file   Social History Narrative   ** Merged History  Encounter **       Patient does not get regular exercise.      Widowed 2004      Disabled    Current Outpatient Prescriptions on File Prior to Visit  Medication Sig Dispense Refill  . albuterol (PROVENTIL) (2.5 MG/3ML) 0.083% nebulizer solution Take 2.5 mg by nebulization every 6 (six) hours as needed for wheezing or shortness of breath.    Marland Kitchen aspirin 81 MG tablet Take 81 mg by mouth at bedtime.     . cholecalciferol (VITAMIN D) 1000 UNITS tablet Take 2,000 Units by mouth every morning.     . ciprofloxacin (CILOXAN) 0.3 % ophthalmic solution Place 2 drops into the right eye daily. Administer 1 drop, every 2 hours, while awake, for 2 days. Then 1 drop, every 4 hours, while awake, for the next 5 days.    . cyclobenzaprine (FLEXERIL) 5 MG tablet TAKE ONE TABLET BY MOUTH THREE TIMES DAILY AS NEEDED FOR MUSCLE SPASMS 90 tablet 0  . diclofenac sodium (VOLTAREN) 1 % GEL Apply 4 g topically 4 (four) times daily as needed (right hand, arm and neck pain.).    Marland Kitchen esomeprazole (NEXIUM) 40 MG capsule Take 1 capsule (40 mg total) by mouth 2 (two) times daily. 60 capsule 3  . famotidine (PEPCID) 20 MG tablet TAKE 1 TABLET BY MOUTH EVERY NIGHT AT BEDTIME 30 tablet 0  . fluticasone (FLONASE) 50 MCG/ACT nasal spray Place 2 sprays into both nostrils every morning. 16 g 6  . gabapentin (NEURONTIN) 800 MG tablet TAKE 1 TABLET BY MOUTH THREE TIMES DAILY 270 tablet 1  . glucose blood (ONE TOUCH ULTRA TEST) test strip Use to test blood sugars twice a day. Dx 250.63 100 each 12  . HYDROcodone-homatropine (HYCODAN) 5-1.5 MG/5ML syrup Take 5 mLs by mouth every 6 (six) hours as needed for cough. 180 mL 0  . irbesartan (AVAPRO) 300 MG tablet TAKE 1 TABLET BY MOUTH DAILY 90 tablet 0  . Lancets (ONETOUCH ULTRASOFT) lancets Use to test blood sugars twice a day. 100 each 12  . loratadine (CLARITIN) 10 MG tablet TAKE 1 TABLET BY MOUTH EVERY DAY 30 tablet 2  . metoprolol (LOPRESSOR) 50 MG tablet Take 75 mg by mouth 2 (two)  times daily.    . montelukast (SINGULAIR) 10 MG tablet Take 10 mg by mouth at bedtime.    Marland Kitchen nystatin cream (MYCOSTATIN) Apply to affected area 2 times daily (Patient taking differently: Apply to affected area 2 times daily PRN) 30 g 0  . ondansetron (ZOFRAN ODT) 4 MG disintegrating tablet Take 1 tablet (4 mg total) by mouth  every 8 (eight) hours as needed for nausea or vomiting. 20 tablet 0  . oxybutynin (DITROPAN-XL) 10 MG 24 hr tablet TAKE 1 TABLET BY MOUTH DAILY 90 tablet 1  . promethazine (PHENERGAN) 25 MG tablet TAKE 1 TABLET BY MOUTH EVERY 6 HOURS AS NEEDED FOR NAUSEA AND VOMITING 30 tablet 0   No current facility-administered medications on file prior to visit.    Allergies  Allergen Reactions  . Ace Inhibitors Anaphylaxis  . Penicillins Hives and Rash  . Tape Hives    Paper tape is ok to use  . Adhesive [Tape] Rash    Family History  Problem Relation Age of Onset  . Esophageal cancer Brother   . Esophageal cancer Sister     ?  . Colon polyps Brother   . Pancreatic cancer Sister     ?  . Diabetes Sister   . Diabetes      Aunt and Uncle  . Heart disease Maternal Grandfather     BP 132/84 mmHg  Pulse 82  Temp(Src) 98 F (36.7 C) (Oral)  Ht 5\' 4"  (1.626 m)  Wt 309 lb (140.161 kg)  BMI 53.01 kg/m2  SpO2 98%  Review of Systems She has lost weight and hypoglycemia    Objective:   Physical Exam VITAL SIGNS:  See vs page GENERAL: no distress Pulses: dorsalis pedis intact bilat.   MSK: no deformity of the feet.  Right shoulder has full ROM, but ROM is painful.  CV: no leg edema Skin:  no ulcer on the feet.  normal color and temp on the feet. Neuro: sensation is intact to touch on the feet    Lab Results  Component Value Date   HGBA1C 8.8* 05/23/2014       Assessment & Plan:  Shoulder pain, new to me Obesity: persistent DM: she needs a simpler regimen  Patient is advised the following: Patient Instructions  Please change both current insulins to NPH,  130 units each morning.  check your blood sugar twice a day.  vary the time of day when you check, between before the 3 meals, and at bedtime.  also check if you have symptoms of your blood sugar being too high or too low.  please keep a record of the readings and bring it to your next appointment here.  You can write it on any piece of paper.  please call us sooner if your blood sugar goes below 70, or if you have a lot of readings over 200.  As you are off the steroids, and continue to feel better, you insulin needs will change, so please call if it is off.  X-rays are requested for you today.  We'll let you know about the results.  Please let Dr Jenny Reichmann know if you want to see the orthopedic specialist.   When you feel better, please continue to pursue the weight loss surgery.

## 2014-09-12 ENCOUNTER — Ambulatory Visit (INDEPENDENT_AMBULATORY_CARE_PROVIDER_SITE_OTHER): Payer: Medicare Other | Admitting: Internal Medicine

## 2014-09-12 ENCOUNTER — Encounter: Payer: Self-pay | Admitting: Internal Medicine

## 2014-09-12 ENCOUNTER — Ambulatory Visit
Admission: RE | Admit: 2014-09-12 | Discharge: 2014-09-12 | Disposition: A | Discharge status: Home / Self Care | Payer: Medicare Other | Source: Ambulatory Visit | Attending: Endocrinology | Admitting: Endocrinology

## 2014-09-12 VITALS — BP 122/80 | HR 90 | Temp 97.4°F | Resp 20 | Ht 64.0 in | Wt 309.0 lb

## 2014-09-12 DIAGNOSIS — I10 Essential (primary) hypertension: Secondary | ICD-10-CM

## 2014-09-12 DIAGNOSIS — M25511 Pain in right shoulder: Secondary | ICD-10-CM | POA: Diagnosis not present

## 2014-09-12 DIAGNOSIS — M19011 Primary osteoarthritis, right shoulder: Secondary | ICD-10-CM | POA: Diagnosis not present

## 2014-09-12 DIAGNOSIS — R531 Weakness: Secondary | ICD-10-CM | POA: Diagnosis not present

## 2014-09-12 NOTE — Assessment & Plan Note (Signed)
stable overall by history and exam, recent data reviewed with pt, and pt to continue medical treatment as before,  to f/u any worsening symptoms or concerns BP Readings from Last 3 Encounters:  09/12/14 122/80  09/11/14 132/84  08/30/14 122/68

## 2014-09-12 NOTE — Assessment & Plan Note (Signed)
With acute and chornic pain, general weakness, also for Aide to help in the home

## 2014-09-12 NOTE — Progress Notes (Signed)
Pre visit review using our clinic review tool, if applicable. No additional management support is needed unless otherwise documented below in the visit note. 

## 2014-09-12 NOTE — Patient Instructions (Addendum)
Please continue all other medications as before, and refills have been done if requested.  Please have the pharmacy call with any other refills you may need.  Please continue your efforts at being more active, low cholesterol diet, and weight control.  You are otherwise up to date with prevention measures today.  Please keep your appointments with your specialists as you may have planned  I will try to order the Aide for help at home  You will be contacted regarding the referral for: Dr Smith/Sports Medicine for the right shoulder  Your Handicap parking permit application was signed today

## 2014-09-12 NOTE — Assessment & Plan Note (Signed)
?   Rot cuff injury - for sport medicine referral

## 2014-09-12 NOTE — Progress Notes (Signed)
Subjective:    Patient ID: Christy May, female    DOB: 08/07/1950, 64 y.o.   MRN: [id number]  HPI  Here to f/u 1 wk post d/c hosp dx bronchitis, now resolved, except msall persistent nonprod cough, no wheezing or worsening sob/doe.  Pt denies chest pain, increased sob or doe, wheezing, orthopnea, PND, increased LE swelling, palpitations, dizziness or syncope. Lost 21 lbs with better diet, and fasting program assoc with her church, was [facility] with Dr Hale Bogus per pt  No fever.  Does also have 1 wk worsening right shoulder pain, acute on chronic, now unable to abduct past 90 degrees, does not have ortho except for Dr Apolonio Schneiders for hand surgury.  Pt denies new neurological symptoms such as new headache, or facial or extremity weakness or numbness.  Since d/c has had ST at home (still needs PT per pt), and ST assisted her with an appt to ENT that the [place] thought was needed.  Also though to need OT and PT. Pt feels she needs an Aide as well due to chronic pain and acute pain with shoulder Past Medical History  Diagnosis Date  . Anemia, unspecified   . Dehydration   . Depressive disorder, not elsewhere classified   . Esophageal reflux   . Unspecified essential hypertension   . Unspecified menopausal and postmenopausal disorder   . Obesity, unspecified   . Inflammatory and toxic neuropathy, unspecified   . Unspecified vitamin D deficiency   . Dysphagia 2007    historyof dysphagia with severe dysmotility by barium swallow-[name]  . Obesity   . Hypertension   . Allergic bronchitis   . Complication of anesthesia     "I've had recall; I probably stopped breathing at least 2 times" (09/03/2014)  . Family history of adverse reaction to anesthesia     "my brother was told they lost him a couple times during OR"  . OSA (obstructive sleep apnea)     "stopped using my CPAP; just couldn't use it cause it kept me awake and I couldn't keep it on my face'"(09/03/2014)  . Heart murmur   . Chronic airway  obstruction, not elsewhere classified     "I was told I don't have this/tests done @ [facility] 05/2014"  . Extrinsic asthma, unspecified     "I was told I don't have this/tests done @ [facility] 05/2014"  . Type II diabetes mellitus   . History of hiatal hernia   . Headache     "weekly to monthly" (09/03/2014)  . Migraine     "couple times/month right now" (09/03/2014)  . Spondylosis of unspecified site without mention of myelopathy   . Arthritis     "neck, back, hands" (09/03/2014)  . Diabetic peripheral neuropathy   . Chronic back pain    Past Surgical History  Procedure Laterality Date  . Refractive surgery Bilateral   . Anterior cervical decomp/discectomy fusion      multiple cervical spine levels;Dr. Arnoldo Morale  . Incision and drainage abscess      Chest  . Dilation and curettage of uterus    . Exploratory laparotomy    . Total knee arthroplasty Left 10/2003  . Total knee arthroplasty Right 01/2004    [name]  . Foot surgery Right X 2    "took blood out of my arms and put platelets in my feet"  . Appendectomy    . Tonsillectomy    . Esophagogastroduodenoscopy N/A 05/08/2014    Procedure: ESOPHAGOGASTRODUODENOSCOPY (EGD);  Surgeon:  [name], MD;  Location: [name];  Service: Endoscopy;  Laterality: N/A;  . Colonoscopy N/A 05/08/2014    Procedure: COLONOSCOPY;  Surgeon: [name], MD;  Location: [facility];  Service: Endoscopy;  Laterality: N/A;  . Back surgery    . Cholecystectomy open    . Abdominal hysterectomy      "partial"  . Eye surgery    . Joint replacement      reports that she quit smoking about 28 years ago. Her smoking use included Cigarettes. She has a 20 pack-year smoking history. She has never used smokeless tobacco. She reports that she drinks alcohol. She reports that she does not use illicit drugs. family history includes Colon polyps in her brother; Diabetes in her sister and another family member; Esophageal cancer in her brother and sister;  Heart disease in her maternal grandfather; Pancreatic cancer in her sister. Allergies  Allergen Reactions  . Ace Inhibitors Anaphylaxis  . Penicillins Hives and Rash  . Tape Hives    Paper tape is ok to use  . Adhesive [Tape] Rash   Current Outpatient Prescriptions on File Prior to Visit  Medication Sig Dispense Refill  . albuterol (PROVENTIL) (2.5 MG/3ML) 0.083% nebulizer solution Take 2.5 mg by nebulization every 6 (six) hours as needed for wheezing or shortness of breath.    Marland Kitchen aspirin 81 MG tablet Take 81 mg by mouth at bedtime.     . cholecalciferol (VITAMIN D) 1000 UNITS tablet Take 2,000 Units by mouth every morning.     . ciprofloxacin (CILOXAN) 0.3 % ophthalmic solution Place 2 drops into the right eye daily. Administer 1 drop, every 2 hours, while awake, for 2 days. Then 1 drop, every 4 hours, while awake, for the next 5 days.    . cyclobenzaprine (FLEXERIL) 5 MG tablet TAKE ONE TABLET BY MOUTH THREE TIMES DAILY AS NEEDED FOR MUSCLE SPASMS 90 tablet 0  . diclofenac sodium (VOLTAREN) 1 % GEL Apply 4 g topically 4 (four) times daily as needed (right hand, arm and neck pain.).    Marland Kitchen esomeprazole (NEXIUM) 40 MG capsule Take 1 capsule (40 mg total) by mouth 2 (two) times daily. 60 capsule 3  . famotidine (PEPCID) 20 MG tablet TAKE 1 TABLET BY MOUTH EVERY NIGHT AT BEDTIME 30 tablet 0  . fluticasone (FLONASE) 50 MCG/ACT nasal spray Place 2 sprays into both nostrils every morning. 16 g 6  . gabapentin (NEURONTIN) 800 MG tablet TAKE 1 TABLET BY MOUTH THREE TIMES DAILY 270 tablet 1  . glucose blood (ONE TOUCH ULTRA TEST) test strip Use to test blood sugars twice a day. Dx 250.63 100 each 12  . HYDROcodone-homatropine (HYCODAN) 5-1.5 MG/5ML syrup Take 5 mLs by mouth every 6 (six) hours as needed for cough. 180 mL 0  . insulin NPH Human (HUMULIN N,NOVOLIN N) 100 UNIT/ML injection Inject 1.3 mLs (130 Units total) into the skin every morning. And syringes 2/day 50 mL 11  . irbesartan (AVAPRO) 300  MG tablet TAKE 1 TABLET BY MOUTH DAILY 90 tablet 0  . Lancets (ONETOUCH ULTRASOFT) lancets Use to test blood sugars twice a day. 100 each 12  . loratadine (CLARITIN) 10 MG tablet TAKE 1 TABLET BY MOUTH EVERY DAY 30 tablet 2  . metoprolol (LOPRESSOR) 50 MG tablet Take 75 mg by mouth 2 (two) times daily.    . montelukast (SINGULAIR) 10 MG tablet Take 10 mg by mouth at bedtime.    Marland Kitchen nystatin cream (MYCOSTATIN) Apply to affected  area 2 times daily (Patient taking differently: Apply to affected area 2 times daily PRN) 30 g 0  . ondansetron (ZOFRAN ODT) 4 MG disintegrating tablet Take 1 tablet (4 mg total) by mouth every 8 (eight) hours as needed for nausea or vomiting. 20 tablet 0  . oxybutynin (DITROPAN-XL) 10 MG 24 hr tablet TAKE 1 TABLET BY MOUTH DAILY 90 tablet 1  . promethazine (PHENERGAN) 25 MG tablet TAKE 1 TABLET BY MOUTH EVERY 6 HOURS AS NEEDED FOR NAUSEA AND VOMITING 30 tablet 0   No current facility-administered medications on file prior to visit.   Review of Systems  Constitutional: Negative for unusual diaphoresis or night sweats HENT: Negative for ringing in ear or discharge Eyes: Negative for double vision or worsening visual disturbance.  Respiratory: Negative for choking and stridor.   Gastrointestinal: Negative for vomiting or other signifcant bowel change Genitourinary: Negative for hematuria or change in urine volume.  Musculoskeletal: Negative for other MSK pain or swelling Skin: Negative for color change and worsening wound.  Neurological: Negative for tremors and numbness other than noted  Psychiatric/Behavioral: Negative for decreased concentration or agitation other than above       Objective:   Physical Exam BP 122/80 mmHg  Pulse 90  Temp(Src) 97.4 F (36.3 C) (Oral)  Resp 20  Ht 5\' 4"  (1.626 m)  Wt 309 lb (140.161 kg)  BMI 53.01 kg/m2  SpO2 97% VS noted,  Constitutional: Pt appears in no significant distress HENT: Head: NCAT.  Right Ear: External ear  normal.  Left Ear: External ear normal.  Eyes: . Pupils are equal, round, and reactive to light. Conjunctivae and EOM are normal Neck: Normal range of motion. Neck supple.  Cardiovascular: Normal rate and regular rhythm.   Pulmonary/Chest: Effort normal and decreased breath sounds without rales or wheezing.  Neurological: Pt is alert. Not confused , motor grossly intact Skin: Skin is warm. No rash, no LE edema Psychiatric: Pt behavior is normal. No agitation.     Assessment & Plan:

## 2014-09-13 DIAGNOSIS — G4733 Obstructive sleep apnea (adult) (pediatric): Secondary | ICD-10-CM | POA: Diagnosis not present

## 2014-09-13 DIAGNOSIS — I129 Hypertensive chronic kidney disease with stage 1 through stage 4 chronic kidney disease, or unspecified chronic kidney disease: Secondary | ICD-10-CM | POA: Diagnosis not present

## 2014-09-13 DIAGNOSIS — D649 Anemia, unspecified: Secondary | ICD-10-CM | POA: Diagnosis not present

## 2014-09-13 DIAGNOSIS — E1042 Type 1 diabetes mellitus with diabetic polyneuropathy: Secondary | ICD-10-CM | POA: Diagnosis not present

## 2014-09-13 DIAGNOSIS — J441 Chronic obstructive pulmonary disease with (acute) exacerbation: Secondary | ICD-10-CM | POA: Diagnosis not present

## 2014-09-13 DIAGNOSIS — J45901 Unspecified asthma with (acute) exacerbation: Secondary | ICD-10-CM | POA: Diagnosis not present

## 2014-09-14 DIAGNOSIS — I129 Hypertensive chronic kidney disease with stage 1 through stage 4 chronic kidney disease, or unspecified chronic kidney disease: Secondary | ICD-10-CM | POA: Diagnosis not present

## 2014-09-14 DIAGNOSIS — G4733 Obstructive sleep apnea (adult) (pediatric): Secondary | ICD-10-CM | POA: Diagnosis not present

## 2014-09-14 DIAGNOSIS — D649 Anemia, unspecified: Secondary | ICD-10-CM | POA: Diagnosis not present

## 2014-09-14 DIAGNOSIS — J441 Chronic obstructive pulmonary disease with (acute) exacerbation: Secondary | ICD-10-CM | POA: Diagnosis not present

## 2014-09-14 DIAGNOSIS — J45901 Unspecified asthma with (acute) exacerbation: Secondary | ICD-10-CM | POA: Diagnosis not present

## 2014-09-14 DIAGNOSIS — E1042 Type 1 diabetes mellitus with diabetic polyneuropathy: Secondary | ICD-10-CM | POA: Diagnosis not present

## 2014-09-15 DIAGNOSIS — J441 Chronic obstructive pulmonary disease with (acute) exacerbation: Secondary | ICD-10-CM | POA: Diagnosis not present

## 2014-09-15 DIAGNOSIS — E1042 Type 1 diabetes mellitus with diabetic polyneuropathy: Secondary | ICD-10-CM | POA: Diagnosis not present

## 2014-09-15 DIAGNOSIS — G4733 Obstructive sleep apnea (adult) (pediatric): Secondary | ICD-10-CM | POA: Diagnosis not present

## 2014-09-15 DIAGNOSIS — J45901 Unspecified asthma with (acute) exacerbation: Secondary | ICD-10-CM | POA: Diagnosis not present

## 2014-09-15 DIAGNOSIS — I129 Hypertensive chronic kidney disease with stage 1 through stage 4 chronic kidney disease, or unspecified chronic kidney disease: Secondary | ICD-10-CM | POA: Diagnosis not present

## 2014-09-15 DIAGNOSIS — D649 Anemia, unspecified: Secondary | ICD-10-CM | POA: Diagnosis not present

## 2014-09-17 DIAGNOSIS — I129 Hypertensive chronic kidney disease with stage 1 through stage 4 chronic kidney disease, or unspecified chronic kidney disease: Secondary | ICD-10-CM | POA: Diagnosis not present

## 2014-09-17 DIAGNOSIS — J441 Chronic obstructive pulmonary disease with (acute) exacerbation: Secondary | ICD-10-CM | POA: Diagnosis not present

## 2014-09-17 DIAGNOSIS — D649 Anemia, unspecified: Secondary | ICD-10-CM | POA: Diagnosis not present

## 2014-09-17 DIAGNOSIS — J45901 Unspecified asthma with (acute) exacerbation: Secondary | ICD-10-CM | POA: Diagnosis not present

## 2014-09-17 DIAGNOSIS — G4733 Obstructive sleep apnea (adult) (pediatric): Secondary | ICD-10-CM | POA: Diagnosis not present

## 2014-09-17 DIAGNOSIS — E1042 Type 1 diabetes mellitus with diabetic polyneuropathy: Secondary | ICD-10-CM | POA: Diagnosis not present

## 2014-09-17 NOTE — Patient Outreach (Signed)
[facility] [facility]) Care Management  09/17/2014  Christy May 1950-11-25 [id number]   Received notification from [facility] to close case at this time to due patient have met goals.  Case has been closed at this time.  Ronnell Freshwater. [place] CM Assistant Phone: [phone] Fax: [phone]

## 2014-09-18 DIAGNOSIS — G4733 Obstructive sleep apnea (adult) (pediatric): Secondary | ICD-10-CM | POA: Diagnosis not present

## 2014-09-18 DIAGNOSIS — D649 Anemia, unspecified: Secondary | ICD-10-CM | POA: Diagnosis not present

## 2014-09-18 DIAGNOSIS — J441 Chronic obstructive pulmonary disease with (acute) exacerbation: Secondary | ICD-10-CM | POA: Diagnosis not present

## 2014-09-18 DIAGNOSIS — I129 Hypertensive chronic kidney disease with stage 1 through stage 4 chronic kidney disease, or unspecified chronic kidney disease: Secondary | ICD-10-CM | POA: Diagnosis not present

## 2014-09-18 DIAGNOSIS — E1042 Type 1 diabetes mellitus with diabetic polyneuropathy: Secondary | ICD-10-CM | POA: Diagnosis not present

## 2014-09-18 DIAGNOSIS — J45901 Unspecified asthma with (acute) exacerbation: Secondary | ICD-10-CM | POA: Diagnosis not present

## 2014-09-19 DIAGNOSIS — D649 Anemia, unspecified: Secondary | ICD-10-CM | POA: Diagnosis not present

## 2014-09-19 DIAGNOSIS — J45901 Unspecified asthma with (acute) exacerbation: Secondary | ICD-10-CM | POA: Diagnosis not present

## 2014-09-19 DIAGNOSIS — J441 Chronic obstructive pulmonary disease with (acute) exacerbation: Secondary | ICD-10-CM | POA: Diagnosis not present

## 2014-09-19 DIAGNOSIS — I129 Hypertensive chronic kidney disease with stage 1 through stage 4 chronic kidney disease, or unspecified chronic kidney disease: Secondary | ICD-10-CM | POA: Diagnosis not present

## 2014-09-19 DIAGNOSIS — E1042 Type 1 diabetes mellitus with diabetic polyneuropathy: Secondary | ICD-10-CM | POA: Diagnosis not present

## 2014-09-19 DIAGNOSIS — G4733 Obstructive sleep apnea (adult) (pediatric): Secondary | ICD-10-CM | POA: Diagnosis not present

## 2014-09-19 NOTE — Patient Outreach (Signed)
Phone call from patient stating that she kept her appointment for consumer credit counseling and found it very   beneficial. Per patient, she was also able to afford her insulin this month due to change from flex pen to vial.    [place], [place] Management [phone]

## 2014-09-20 ENCOUNTER — Other Ambulatory Visit: Payer: Self-pay | Admitting: Internal Medicine

## 2014-09-20 ENCOUNTER — Other Ambulatory Visit: Payer: Self-pay | Admitting: Emergency Medicine

## 2014-09-20 DIAGNOSIS — E1042 Type 1 diabetes mellitus with diabetic polyneuropathy: Secondary | ICD-10-CM | POA: Diagnosis not present

## 2014-09-20 DIAGNOSIS — I129 Hypertensive chronic kidney disease with stage 1 through stage 4 chronic kidney disease, or unspecified chronic kidney disease: Secondary | ICD-10-CM | POA: Diagnosis not present

## 2014-09-20 DIAGNOSIS — J441 Chronic obstructive pulmonary disease with (acute) exacerbation: Secondary | ICD-10-CM | POA: Diagnosis not present

## 2014-09-20 DIAGNOSIS — J45901 Unspecified asthma with (acute) exacerbation: Secondary | ICD-10-CM | POA: Diagnosis not present

## 2014-09-20 DIAGNOSIS — D649 Anemia, unspecified: Secondary | ICD-10-CM | POA: Diagnosis not present

## 2014-09-20 DIAGNOSIS — G4733 Obstructive sleep apnea (adult) (pediatric): Secondary | ICD-10-CM | POA: Diagnosis not present

## 2014-09-21 ENCOUNTER — Other Ambulatory Visit: Payer: Self-pay | Admitting: Endocrinology

## 2014-10-02 ENCOUNTER — Other Ambulatory Visit: Payer: Self-pay | Admitting: Emergency Medicine

## 2014-10-02 ENCOUNTER — Other Ambulatory Visit: Payer: Self-pay | Admitting: Internal Medicine

## 2014-10-04 ENCOUNTER — Telehealth: Payer: Self-pay | Admitting: Emergency Medicine

## 2014-10-04 NOTE — Telephone Encounter (Signed)
lmtcb for pt.  

## 2014-10-05 ENCOUNTER — Telehealth: Payer: Self-pay

## 2014-10-05 NOTE — Telephone Encounter (Signed)
PA information gathered for pt and ready to submit to pt plan.Marland Kitchen

## 2014-10-05 NOTE — Telephone Encounter (Signed)
Pt cb, [id number]

## 2014-10-05 NOTE — Telephone Encounter (Signed)
Spoke with pt, wants to know if the generic nexium would work as well as the name brand.  I advised her that she should still benefit from the generic medication.  Nothing further needed.

## 2014-10-05 NOTE — Telephone Encounter (Signed)
lmtcb X2 for pt.  

## 2014-10-11 ENCOUNTER — Ambulatory Visit: Payer: Medicare Other | Admitting: Emergency Medicine

## 2014-10-12 ENCOUNTER — Telehealth: Payer: Self-pay

## 2014-10-12 MED ORDER — INSULIN ASPART 100 UNIT/ML FLEXPEN: PEN_INJECTOR | SUBCUTANEOUS | Status: DC

## 2014-10-12 NOTE — Telephone Encounter (Signed)
Rx for novolog sent. Pt notified that rx has been sent.

## 2014-10-12 NOTE — Telephone Encounter (Signed)
Humalog is no longer covered under the pt's insurance. Preferred in Novolog. Ok to change medication?

## 2014-10-12 NOTE — Telephone Encounter (Signed)
Patient is currently taking 130 units of Humulin N. Ok to send for 130 units of Novolog? Thanks!

## 2014-10-12 NOTE — Telephone Encounter (Signed)
ok 

## 2014-10-12 NOTE — Telephone Encounter (Signed)
Yes, but should be novolin NPH

## 2014-10-15 ENCOUNTER — Ambulatory Visit (HOSPITAL_BASED_OUTPATIENT_CLINIC_OR_DEPARTMENT_OTHER): Payer: PPO | Attending: Emergency Medicine | Admitting: Radiology

## 2014-10-15 VITALS — Ht 64.0 in | Wt 309.0 lb

## 2014-10-15 DIAGNOSIS — G4710 Hypersomnia, unspecified: Secondary | ICD-10-CM

## 2014-10-15 DIAGNOSIS — G4719 Other hypersomnia: Secondary | ICD-10-CM | POA: Diagnosis present

## 2014-10-15 DIAGNOSIS — G4730 Sleep apnea, unspecified: Secondary | ICD-10-CM

## 2014-10-15 DIAGNOSIS — G4733 Obstructive sleep apnea (adult) (pediatric): Secondary | ICD-10-CM | POA: Diagnosis not present

## 2014-10-16 ENCOUNTER — Ambulatory Visit: Payer: Medicare Other | Admitting: Family Medicine

## 2014-10-16 DIAGNOSIS — Z0289 Encounter for other administrative examinations: Secondary | ICD-10-CM

## 2014-10-17 ENCOUNTER — Telehealth: Payer: Self-pay | Admitting: Emergency Medicine

## 2014-10-17 ENCOUNTER — Other Ambulatory Visit: Payer: Self-pay

## 2014-10-17 DIAGNOSIS — G4733 Obstructive sleep apnea (adult) (pediatric): Secondary | ICD-10-CM

## 2014-10-17 MED ORDER — INSULIN ASPART 100 UNIT/ML FLEXPEN: PEN_INJECTOR | SUBCUTANEOUS | Status: DC

## 2014-10-17 NOTE — Telephone Encounter (Signed)
Pt states that she had a sleep study 10/16/14 - was told that she does have confirmed sleep apnea, gave patient the mask she used during the test.  Pt advised the once results are reviewed we will call her with further results and proceed with proper treatment.  Pt states that she has O2 but has not been using this at night bc she has not been instructed to do so.  Pt states that when she had her Endo/Colonoscopy they advised her that her O2 dropped low and she needed to be O2 at night I advised the patient that I would speak with Dr Lamonte Sakai to see if he wants to her use O2 at night until we get the results and get her started on treatment.   Please advise Dr Lamonte Sakai. Thanks.

## 2014-10-18 DIAGNOSIS — G4733 Obstructive sleep apnea (adult) (pediatric): Secondary | ICD-10-CM | POA: Diagnosis not present

## 2014-10-18 NOTE — Telephone Encounter (Signed)
Dr Halford Chessman talked to me about her study > we need to order an auto-set CPAP for her with range of 5-20 cmH2O, heated humidity

## 2014-10-18 NOTE — Sleep Study (Signed)
[place]  NAME: Christy May DATE OF BIRTH:  [birth year] MEDICAL RECORD NUMBER [id number]  LOCATION: [facility]  PHYSICIAN: Chesley Mires, M.D. DATE OF STUDY: 10/15/2014  SLEEP STUDY TYPE: Split night protocol               REFERRING PHYSICIAN: Collene Gobble, MD  INDICATION FOR STUDY:  BREDA BOND is a 64 y.o. female who presents to the sleep lab for evaluation of hypersomnia with obstructive sleep apnea.  She reports snoring, sleep disruption, apnea, and daytime sleepiness.  EPWORTH SLEEPINESS SCORE: 4. HEIGHT: 5\' 4"  (162.6 cm)  WEIGHT: 309 lb (140.161 kg)    Body mass index is 53.01 kg/(m^2).  NECK SIZE: 16 in.  MEDICATIONS:  Current Outpatient Prescriptions on File Prior to Visit  Medication Sig Dispense Refill  . albuterol (PROVENTIL) (2.5 MG/3ML) 0.083% nebulizer solution Take 2.5 mg by nebulization every 6 (six) hours as needed for wheezing or shortness of breath.    Marland Kitchen aspirin 81 MG tablet Take 81 mg by mouth at bedtime.     . cholecalciferol (VITAMIN D) 1000 UNITS tablet Take 2,000 Units by mouth every morning.     . ciprofloxacin (CILOXAN) 0.3 % ophthalmic solution Place 2 drops into the right eye daily. Administer 1 drop, every 2 hours, while awake, for 2 days. Then 1 drop, every 4 hours, while awake, for the next 5 days.    . cyclobenzaprine (FLEXERIL) 5 MG tablet TAKE ONE TABLET BY MOUTH THREE TIMES DAILY AS NEEDED FOR MUSCLE SPASMS 90 tablet 0  . cyclobenzaprine (FLEXERIL) 5 MG tablet TAKE 1 TABLET BY MOUTH THREE TIMES DAILY AS NEEDED FOR MUSCLE SPASMS 90 tablet 0  . diclofenac sodium (VOLTAREN) 1 % GEL Apply 4 g topically 4 (four) times daily as needed (right hand, arm and neck pain.).    Marland Kitchen famotidine (PEPCID) 20 MG tablet TAKE 1 TABLET BY MOUTH EVERY NIGHT AT BEDTIME 30 tablet 5  . fluticasone (FLONASE) 50 MCG/ACT nasal spray Place 2 sprays into both nostrils every morning. 16 g 6  . gabapentin (NEURONTIN) 800 MG  tablet TAKE 1 TABLET BY MOUTH THREE TIMES DAILY 270 tablet 1  . glucose blood (ONE TOUCH ULTRA TEST) test strip Use to test blood sugars twice a day. Dx 250.63 100 each 12  . HYDROcodone-homatropine (HYCODAN) 5-1.5 MG/5ML syrup Take 5 mLs by mouth every 6 (six) hours as needed for cough. 180 mL 0  . irbesartan (AVAPRO) 300 MG tablet TAKE 1 TABLET BY MOUTH DAILY 90 tablet 0  . Lancets (ONETOUCH ULTRASOFT) lancets Use to test blood sugars twice a day. 100 each 12  . loratadine (CLARITIN) 10 MG tablet TAKE 1 TABLET BY MOUTH EVERY DAY 30 tablet 2  . metoprolol (LOPRESSOR) 50 MG tablet Take 75 mg by mouth 2 (two) times daily.    . montelukast (SINGULAIR) 10 MG tablet Take 10 mg by mouth at bedtime.    Marland Kitchen NEXIUM 40 MG capsule TAKE ONE CAPSULE BY MOUTH TWICE DAILY 60 capsule 5  . nystatin cream (MYCOSTATIN) Apply to affected area 2 times daily (Patient taking differently: Apply to affected area 2 times daily PRN) 30 g 0  . ondansetron (ZOFRAN ODT) 4 MG disintegrating tablet Take 1 tablet (4 mg total) by mouth every 8 (eight) hours as needed for nausea or vomiting. 20 tablet 0  . ONE TOUCH ULTRA TEST test strip USE TO TEST BLOOD SUGARS TWICE DAILY 100 each 2  .  oxybutynin (DITROPAN-XL) 10 MG 24 hr tablet TAKE 1 TABLET BY MOUTH DAILY 90 tablet 1  . promethazine (PHENERGAN) 25 MG tablet TAKE 1 TABLET BY MOUTH EVERY 6 HOURS AS NEEDED FOR NAUSEA AND VOMITING 30 tablet 0   No current facility-administered medications on file prior to visit.    SLEEP ARCHITECTURE:  Diagnostic portion: Total recording time: 125.5 minutes.  Total sleep time was: 120.5 minutes.  Sleep efficiency: 96%.  Sleep latency: 3 minutes.  REM latency: N/A minutes.  Stage N1: 4.1%.  Stage N2: 95.9%.  Stage N3: 0%.  Stage R:  0%.  Supine sleep: 0 minutes.  Non-supine sleep: 120.5 minutes.  Titration portion: Total recording time: 242.5 minutes.  Total sleep time was: 228 minutes.  Sleep efficiency: 94%.  Sleep latency: 1 minutes.  REM  latency: 8.5 minutes.  Stage N1: 1.5%.  Stage N2: 71.3%.  Stage N3: 0%.  Stage R:  27.2%.  Supine sleep: 162 minutes.  Non-supine sleep: 66 minutes.  CARDIAC DATA:  Average heart rate: 82 beats per minute. Rhythm strip: sinus rhythm with PVCs.  RESPIRATORY DATA: Average respiratory rate: 16. Snoring: Moderate.  Diagnostic portion: Average AHI: 29.9.   Apnea index: 2.  Hypopnea index: 27.9. Obstructive apnea index: 2.  Central apnea index: 0.  Mixed apnea index: 0. REM AHI: N/A.  NREM AHI: 29.9. Supine AHI: N/A. Non-supine AHI: 29.9.  Titration portion: She was started on CPAP 5 and increased to 17 cm H2O.  There was considerable variability in controlling her sleep apnea, depending on her stage of sleep and sleep position.  She did well with CPAP 10 cm H2O in non supine and NREM sleep, but needed pressures up to 17 cm H2O while in supine and REM sleep.  MOVEMENT/PARASOMNIA:  Diagnostic portion: Periodic limb movement: 3.  Period limb movements with arousals: 0.  Titration portion: Periodic limb movement: 5.8.  Period limb movements with arousals: 0.3.  Restroom trips: One.  OXYGEN DATA:  Baseline oxygenation: 90%. Lowest SaO2: 68%. Time spent below SaO2 90%: 77.6 minutes. Supplemental oxygen used: none.  IMPRESSION/ RECOMMENDATION:   This study shows moderate to severe obstructive sleep apnea with an AHI of 29.9 and SaO2 low of 68%.  She ultimately had control of her sleep apnea and oxygenation while using CPAP, but there was considerable variability in pressure needs based on her sleep stage and sleep position.  She did not require supplemental oxygen while using CPAP.  I would recommend she be started on auto CPAP with pressure range of 5 to 20 cm H2O, and monitored for her clinical response.  She was fitted with a Resmed medium size airfit F10 full face mask.    Chesley Mires, M.D. Diplomate, Tax adviser of Sleep Medicine  ELECTRONICALLY SIGNED ON:  10/18/2014,  8:36 AM [place] PH: [phone]   FX: [phone] [place]

## 2014-10-19 ENCOUNTER — Telehealth: Payer: Self-pay | Admitting: Endocrinology

## 2014-10-19 ENCOUNTER — Telehealth: Payer: Self-pay | Admitting: Emergency Medicine

## 2014-10-19 MED ORDER — HYDROCODONE-HOMATROPINE 5-1.5 MG/5ML PO SYRP: 5.0000 mL | ORAL_SOLUTION | Freq: Four times a day (QID) | ORAL | Status: DC | PRN

## 2014-10-19 NOTE — Telephone Encounter (Signed)
Pt called wants to speak to [facility] regarding some medication and prices please call pt back @ [phone]

## 2014-10-19 NOTE — Telephone Encounter (Signed)
Order will be placed. Pt is aware. Nothing further was needed.

## 2014-10-19 NOTE — Telephone Encounter (Signed)
Since RB is not here in the office to sign rx we will have BQ sign. Rx has been printed and Caryl Pina will have BQ sign. Pt is aware that we will have this ready for her this afternoon.

## 2014-10-19 NOTE — Telephone Encounter (Signed)
Does pt need to continue 02?  If so, how many liters?  Thanks!

## 2014-10-19 NOTE — Telephone Encounter (Signed)
Only CPAP, no oxygen

## 2014-10-19 NOTE — Telephone Encounter (Signed)
I contacted the pt. Pt is going to come and pick up a Novolog discount care to try and get the copayment down for the Novolog.

## 2014-10-19 NOTE — Telephone Encounter (Signed)
Ok to give her hycodan, 240cc, 5cc q6h prn, no RF

## 2014-10-19 NOTE — Telephone Encounter (Signed)
Insulin cost still too high

## 2014-10-19 NOTE — Telephone Encounter (Signed)
Called and spoke to pt. Pt c/o prod cough- pt unsure of color, increase in SOB, fatigue, chest tightness when lying down x 2 days. Pt denies f/c/s. Pt is requesting cough syrup. Pt last seen in 07/2014 by RB.   RB please advise.   Allergies  Allergen Reactions  . Ace Inhibitors Anaphylaxis  . Penicillins Hives and Rash  . Tape Hives    Paper tape is ok to use  . Adhesive [Tape] Rash

## 2014-10-23 ENCOUNTER — Ambulatory Visit: Payer: Medicare Other | Admitting: Endocrinology

## 2014-10-25 ENCOUNTER — Encounter: Payer: Self-pay | Admitting: Endocrinology

## 2014-10-26 ENCOUNTER — Ambulatory Visit: Payer: Medicare Other | Admitting: Endocrinology

## 2014-10-28 ENCOUNTER — Other Ambulatory Visit: Payer: Self-pay | Admitting: Internal Medicine

## 2014-10-30 ENCOUNTER — Encounter: Payer: Self-pay | Admitting: Emergency Medicine

## 2014-10-30 ENCOUNTER — Ambulatory Visit (INDEPENDENT_AMBULATORY_CARE_PROVIDER_SITE_OTHER): Payer: PPO | Admitting: Emergency Medicine

## 2014-10-30 VITALS — BP 140/70 | HR 108 | Ht 64.75 in | Wt 309.0 lb

## 2014-10-30 DIAGNOSIS — G4730 Sleep apnea, unspecified: Secondary | ICD-10-CM | POA: Diagnosis not present

## 2014-10-30 DIAGNOSIS — R05 Cough: Secondary | ICD-10-CM | POA: Diagnosis not present

## 2014-10-30 DIAGNOSIS — K219 Gastro-esophageal reflux disease without esophagitis: Secondary | ICD-10-CM

## 2014-10-30 DIAGNOSIS — J209 Acute bronchitis, unspecified: Secondary | ICD-10-CM | POA: Insufficient documentation

## 2014-10-30 DIAGNOSIS — R059 Cough, unspecified: Secondary | ICD-10-CM

## 2014-10-30 MED ORDER — HYDROCODONE-HOMATROPINE 5-1.5 MG/5ML PO SYRP: 5.0000 mL | ORAL_SOLUTION | Freq: Four times a day (QID) | ORAL | Status: DC | PRN

## 2014-10-30 MED ORDER — OMEPRAZOLE 20 MG PO CPDR: 20.0000 mg | DELAYED_RELEASE_CAPSULE | Freq: Every day | ORAL | Status: DC

## 2014-10-30 MED ORDER — AZITHROMYCIN 250 MG PO TABS: ORAL_TABLET | ORAL | Status: AC

## 2014-10-30 MED ORDER — OMEPRAZOLE 40 MG PO CPDR: 40.0000 mg | DELAYED_RELEASE_CAPSULE | Freq: Every day | ORAL | Status: DC

## 2014-10-30 NOTE — Assessment & Plan Note (Signed)
Will order azithromycin and follow

## 2014-10-30 NOTE — Patient Instructions (Addendum)
Please continue your Singulair, Pepcid, Neurontin. Continue loratadine 10 mg daily Increase omeprazole to 40 mg daily Please restart your fluticasone nasal spray, 2 sprays each nostril twice a day We will work on obtaining your CPAP machine Take azithromycin as directed Use your speech therapy techniques to avoid throat clearing and cough Follow with Dr Lamonte Sakai in 3 months or sooner if you have any problems.

## 2014-10-30 NOTE — Assessment & Plan Note (Signed)
This is significant contributor to her cough and her omeprazole needs to be increased from 20 mg to 40 mg. She will continue her Pepcid as ordered

## 2014-10-30 NOTE — Progress Notes (Signed)
Subjective:    Patient ID: Christy May, female    DOB: 02-11-1951, 64 y.o.   MRN: [id number]  HPI 64 yo woman, former smoker with obesity, OSA, HTN on irbesartan, DM, childhood asthma. Carries a dx of adult asthma made several years ago. Referred for cough. She describes cough that has been prod of white phlegm, started after URI sx about 4 months ago. Describes a tickle in her throat. Can result in stress incontinence.   She has been on Advair in the past. Was recently changed to QVAR + albuterol, uses about 2-3 x a month. She is on omeprazole 20mg  twice a day. She still has occasional reflux sx > can reflux all the way up to throat. She has allergy and rhinitis sx frequently - singulair has been added recently and has helped her.   CXR 12/22/13 - normal.  We will perform full pulmonary function testing  We will change your omeprazole to nexium 40mg  twice a day until our next visit Continue your singulair Stop QVAR for now Keep albuterol available to use 2 puffs if needed for shortness of breath Start loratadine (Claritin) 10mg  daily Start fluticasone nasal spray, 2 sprays each nostril daily Follow with Dr Lamonte Sakai in 1 month  Acute OV 02/01/14 --  Returns for persistent cough. Seen 1 month ago. Seen last month for pulmonary consult for cough.  She is quite upset that her cough is no better and she can not go anywhere due to stress incontinence w/ coughing fits. Has been going on for ~5 months  On Lopressor 75mg  Twice daily   CXR w/ no acute process 12/22/13 .  Former smoker, PFT pending.  Last ov was changed from prilosec to nexium for better GERD control. She continues to have intermittent reflux.  QVAR was held due to no improvement in symptoms. Previous on Advair in past without help.  No fever, orthopnea, edema , n/v/d, discolored mucus. Does have some sinus drainage.  Has used tussionex with some help, but is out of this.   ROV 02/14/14 -- follows for cough, GERD, PND, suspected  asthma. I changed omeprazole to nexium, added loratdine, flonase. TP added pepcid, chlorpheniramine. Her cough has gotten better but has not gone away completely. She is still having. She has been dx w sleep apnea - she has not been able to tolerate CPAP.   PFT done today 02/14/14 > shows some spiro evidence for restriction but for the most part normal. Could not do volumes.    ROV 03/13/14 -- follow up for chronic cough, influenced by GERD and rhinitis. Her PFT are reassuring > little evidence for asthma. Treated recently by Dr Christy May for a possible bronchitis and flare. Temporarily improved but then cough right back to where she started.  She is on high dose nexium, flonase, singulair, pepcid qhs.  She is on loratadine qd, not on chlorpheniramine. She is having some nasal congestion. She still has some mild GERD sx.   ROV 08/09/14 -- follow-up visit for chronic cough. She has a history of GERD and allergic rhinitis. We have not been able to establish a diagnosis of asthma based on her spirometry.She had been feeling well, fairly good cough control. Has been going to the speech therapists at [facility] with benefit. She is on singulair, pepcid, Nexium, neurontin, fluticasone, loratadine. She asks today about OSA and CPAP.   ROV 10/30/14 -- follow-up visit for chronic cough, upper airway disease, GERD allergic rhinitis. She has benefited from speech therapy, loratadine,  Singulair, Pepcid, omeprazole,  Neurontin. She has been dx with OSA and we started her on auto-set CPAP since her last visit.  She tells me today that she doesn't have the CPAP yet.  She tells me that she has been having more trouble with her cough since last several weeks. She is coughing up some beige mucous.     Review of Systems As per HPI     Objective:   Physical Exam Filed Vitals:   10/30/14 1501  BP: 140/70  Pulse: 108  Height: 5' 4.75" (1.645 m)  Weight: 309 lb (140.161 kg)  SpO2: 93%    GEN: A/Ox3; pleasant , NAD, obese    HEENT:  [place]/AT,  EACs-clear, TMs-wnl, NOSE-clear, THROAT-clear, no lesions, no postnasal drip or exudate noted. Hoarse voice   NECK:  Very distant stridor  RESP  Clear  P & A; w/o, wheezes/ rales/ or rhonchi.  CARD:  RRR, no m/r/g  , tr peripheral edema, pulses intact, no cyanosis or clubbing.  Musco: Warm bil, no deformities or joint swelling noted.   Neuro: alert, no focal deficits noted.    Skin: Warm, no lesions or rashes      Assessment & Plan:  GERD This is significant contributor to her cough and her omeprazole needs to be increased from 20 mg to 40 mg. She will continue her Pepcid as ordered   Sleep apnea She has not  Yet received her CPAP device. I would like for her to be on an auto titration device with a range from 5-20 cm of water. Confirm that this is ordered and ensure that she receives   Cough This is influenced by both her esophageal reflux and her allergic rhinitis. We will attempt to treat both maximally. She needs to restart her fluticasone nasal spray. She will continue loratadine, Neurontin, Singulair. Her GERD will be treated as above.  We will prescribe Hycodan for cough symptom control

## 2014-10-30 NOTE — Assessment & Plan Note (Addendum)
This is influenced by both her esophageal reflux and her allergic rhinitis. We will attempt to treat both maximally. She needs to restart her fluticasone nasal spray. She will continue loratadine, Neurontin, Singulair. Her GERD will be treated as above.  We will prescribe Hycodan for cough symptom control

## 2014-10-30 NOTE — Assessment & Plan Note (Signed)
She has not  Yet received her CPAP device. I would like for her to be on an auto titration device with a range from 5-20 cm of water. Confirm that this is ordered and ensure that she receives

## 2014-10-31 ENCOUNTER — Telehealth: Payer: Self-pay | Admitting: Family Medicine

## 2014-10-31 NOTE — Telephone Encounter (Signed)
This pt was previously scheduled with dr Tamala Julian & "no showed." He is already double booked. She can be scheduled on 5/23 which is his first available.

## 2014-10-31 NOTE — Telephone Encounter (Signed)
Would be a new patient.  She is requesting to be worked in soon for shoulder pain.

## 2014-11-01 ENCOUNTER — Emergency Department (INDEPENDENT_AMBULATORY_CARE_PROVIDER_SITE_OTHER)
Admission: EM | Admit: 2014-11-01 | Discharge: 2014-11-01 | Disposition: A | Discharge status: Home / Self Care | Payer: PPO | Source: Home / Self Care | Attending: Family Medicine | Admitting: Family Medicine

## 2014-11-01 ENCOUNTER — Encounter (HOSPITAL_COMMUNITY): Payer: Self-pay | Admitting: Emergency Medicine

## 2014-11-01 ENCOUNTER — Other Ambulatory Visit: Payer: Self-pay | Admitting: Endocrinology

## 2014-11-01 ENCOUNTER — Other Ambulatory Visit: Payer: Self-pay | Admitting: Internal Medicine

## 2014-11-01 DIAGNOSIS — M25511 Pain in right shoulder: Secondary | ICD-10-CM

## 2014-11-01 DIAGNOSIS — M609 Myositis, unspecified: Secondary | ICD-10-CM

## 2014-11-01 MED ORDER — METHYLPREDNISOLONE SODIUM SUCC 125 MG IJ SOLR: 80.0000 mg | Freq: Once | INTRAMUSCULAR | Status: DC

## 2014-11-01 MED ORDER — TRAMADOL HCL 50 MG PO TABS: 50.0000 mg | ORAL_TABLET | Freq: Four times a day (QID) | ORAL | Status: DC | PRN

## 2014-11-01 MED ORDER — METHYLPREDNISOLONE SODIUM SUCC 125 MG IJ SOLR
INTRAMUSCULAR | Status: AC
  Filled 2014-11-01: qty 2

## 2014-11-01 NOTE — Telephone Encounter (Signed)
Patient states she has been in a lot of pain for three days and would like to know if Dr. Jenny Reichmann can do anything.

## 2014-11-01 NOTE — Telephone Encounter (Signed)
Very sorry, but I dont do the injection procedure that she needs, I can try to refer to orthopedic but this might take longer than seeing Dr Tamala Julian on 5/23

## 2014-11-01 NOTE — Telephone Encounter (Signed)
Pt informed of MD response.   Pt states that the pain is too unbearable to wait til 5.23 appt with Dr. Tamala Julian.   Instructed pt that if pain is unbearable then it may be advantageous to go to urgent care for temporary relief of the pain, at least until appt with Dr. Tamala Julian.

## 2014-11-01 NOTE — ED Provider Notes (Signed)
CSN: [id number]     Arrival date & time 11/01/14  1604 History   First MD Initiated Contact with Patient 11/01/14 1729     Chief Complaint  Patient presents with  . Shoulder Pain   (Consider location/radiation/quality/duration/timing/severity/associated sxs/prior Treatment) HPI Comments: 64 year old morbidly obese female with type 2 diabetes mellitus has right shoulder pain for proximal one month. Her endocrinologist ordered an x-ray of the following day saw her PCP. The x-ray did not show any bony pathology. The PCP did not provide any management for her. He did provide referral to the sports med clinic but she has missed 2 appointments so far. She points to the musculature to this. Aspect of the shoulder posterior anterior, deltoid muscle and triceps muscles as a source of pain. Denies any known injury or repetitive motion that could be identified as a cause.   Past Medical History  Diagnosis Date  . Anemia, unspecified   . Dehydration   . Depressive disorder, not elsewhere classified   . Esophageal reflux   . Unspecified essential hypertension   . Unspecified menopausal and postmenopausal disorder   . Obesity, unspecified   . Inflammatory and toxic neuropathy, unspecified   . Unspecified vitamin D deficiency   . Dysphagia 2007    historyof dysphagia with severe dysmotility by barium swallow-[name]  . Obesity   . Hypertension   . Allergic bronchitis   . Complication of anesthesia     "I've had recall; I probably stopped breathing at least 2 times" (09/03/2014)  . Family history of adverse reaction to anesthesia     "my brother was told they lost him a couple times during OR"  . OSA (obstructive sleep apnea)     "stopped using my CPAP; just couldn't use it cause it kept me awake and I couldn't keep it on my face'"(09/03/2014)  . Heart murmur   . Chronic airway obstruction, not elsewhere classified     "I was told I don't have this/tests done @ [facility] 05/2014"  . Extrinsic  asthma, unspecified     "I was told I don't have this/tests done @ [facility] 05/2014"  . Type II diabetes mellitus   . History of hiatal hernia   . Headache     "weekly to monthly" (09/03/2014)  . Migraine     "couple times/month right now" (09/03/2014)  . Spondylosis of unspecified site without mention of myelopathy   . Arthritis     "neck, back, hands" (09/03/2014)  . Diabetic peripheral neuropathy   . Chronic back pain    Past Surgical History  Procedure Laterality Date  . Refractive surgery Bilateral   . Anterior cervical decomp/discectomy fusion      multiple cervical spine levels;Dr. Arnoldo Morale  . Incision and drainage abscess      Chest  . Dilation and curettage of uterus    . Exploratory laparotomy    . Total knee arthroplasty Left 10/2003  . Total knee arthroplasty Right 01/2004    [name]  . Foot surgery Right X 2    "took blood out of my arms and put platelets in my feet"  . Appendectomy    . Tonsillectomy    . Esophagogastroduodenoscopy N/A 05/08/2014    Procedure: ESOPHAGOGASTRODUODENOSCOPY (EGD);  Surgeon: [name], MD;  Location: [name];  Service: Endoscopy;  Laterality: N/A;  . Colonoscopy N/A 05/08/2014    Procedure: COLONOSCOPY;  Surgeon: [name], MD;  Location: [facility];  Service: Endoscopy;  Laterality: N/A;  .  Back surgery    . Cholecystectomy open    . Abdominal hysterectomy      "partial"  . Eye surgery    . Joint replacement     Family History  Problem Relation Age of Onset  . Esophageal cancer Brother   . Esophageal cancer Sister     ?  . Colon polyps Brother   . Pancreatic cancer Sister     ?  . Diabetes Sister   . Diabetes      Aunt and Uncle  . Heart disease Maternal Grandfather    History  Substance Use Topics  . Smoking status: Former Smoker -- 1.00 packs/day for 20 years    Types: Cigarettes    Quit date: 06/22/1986  . Smokeless tobacco: Never Used  . Alcohol Use: Yes     Comment: "stopped drinking in ~ 1983"    OB History    No data available     Review of Systems  Constitutional: Negative for fever, chills and activity change.  HENT: Negative.   Respiratory: Negative.   Cardiovascular: Negative.   Musculoskeletal: Negative for back pain.       As per HPI  Skin: Negative for color change, pallor and rash.  Neurological: Positive for numbness.    Allergies  Ace inhibitors; Penicillins; Tape; and Adhesive  Home Medications   Prior to Admission medications   Medication Sig Start Date End Date Taking? Authorizing Provider  aspirin 81 MG tablet Take 81 mg by mouth at bedtime.    Yes Historical Provider, MD  cyclobenzaprine (FLEXERIL) 5 MG tablet TAKE ONE TABLET BY MOUTH THREE TIMES DAILY AS NEEDED FOR MUSCLE SPASMS 07/24/14  Yes Biagio Borg, MD  diclofenac sodium (VOLTAREN) 1 % GEL Apply 4 g topically 4 (four) times daily as needed (right hand, arm and neck pain.).   Yes Historical Provider, MD  famotidine (PEPCID) 20 MG tablet TAKE 1 TABLET BY MOUTH EVERY NIGHT AT BEDTIME 09/21/14  Yes Collene Gobble, MD  fluticasone (FLONASE) 50 MCG/ACT nasal spray Place 2 sprays into both nostrils every morning. 04/26/14  Yes Collene Gobble, MD  gabapentin (NEURONTIN) 800 MG tablet TAKE 1 TABLET BY MOUTH THREE TIMES DAILY 06/27/14  Yes Biagio Borg, MD  Insulin NPH Human, Isophane, (HUMULIN N PEN [place]) Inject 130 Units into the skin daily.   Yes Historical Provider, MD  irbesartan (AVAPRO) 300 MG tablet TAKE 1 TABLET BY MOUTH DAILY 08/21/14  Yes Biagio Borg, MD  metoprolol (LOPRESSOR) 50 MG tablet Take 75 mg by mouth 2 (two) times daily.   Yes Historical Provider, MD  albuterol (PROVENTIL) (2.5 MG/3ML) 0.083% nebulizer solution Take 2.5 mg by nebulization every 6 (six) hours as needed for wheezing or shortness of breath.    Historical Provider, MD  azithromycin (ZITHROMAX Z-PAK) 250 MG tablet Take 2 tablets (500 mg) on  Day 1,  followed by 1 tablet (250 mg) once daily on Days 2 through 5. 10/30/14 11/04/14  Collene Gobble, MD  cholecalciferol (VITAMIN D) 1000 UNITS tablet Take 2,000 Units by mouth every morning.     Historical Provider, MD  glucose blood (ONE TOUCH ULTRA TEST) test strip Use to test blood sugars twice a day. Dx 250.63 10/06/13   Renato Shin, MD  HYDROcodone-homatropine [facility]) 5-1.5 MG/5ML syrup Take 5 mLs by mouth every 6 (six) hours as needed for cough. 10/30/14   Collene Gobble, MD  Lancets [facility] ULTRASOFT) lancets Use to test blood sugars twice  a day. 07/20/13   Renato Shin, MD  loratadine (CLARITIN) 10 MG tablet TAKE 1 TABLET BY MOUTH EVERY DAY 07/26/14   Collene Gobble, MD  montelukast (SINGULAIR) 10 MG tablet Take 10 mg by mouth at bedtime.    Historical Provider, MD  nystatin cream (MYCOSTATIN) Apply to affected area 2 times daily Patient taking differently: Apply to affected area 2 times daily PRN 12/06/13   Biagio Borg, MD  omeprazole (PRILOSEC) 40 MG capsule Take 1 capsule (40 mg total) by mouth daily. 10/30/14   Collene Gobble, MD  ondansetron (ZOFRAN ODT) 4 MG disintegrating tablet Take 1 tablet (4 mg total) by mouth every 8 (eight) hours as needed for nausea or vomiting. 08/30/14   Delice Bison Ward, DO  ONE TOUCH ULTRA TEST test strip USE TO TEST BLOOD SUGARS TWICE DAILY 09/21/14   Renato Shin, MD  oxybutynin (DITROPAN-XL) 10 MG 24 hr tablet TAKE 1 TABLET BY MOUTH DAILY 08/13/14   Biagio Borg, MD  promethazine (PHENERGAN) 25 MG tablet TAKE 1 TABLET BY MOUTH EVERY 6 HOURS AS NEEDED FOR NAUSEA AND VOMITING 10/02/14   Biagio Borg, MD  traMADol (ULTRAM) 50 MG tablet Take 1 tablet (50 mg total) by mouth every 6 (six) hours as needed. 11/01/14   Janne Napoleon, NP   BP 142/61 mmHg  Pulse 87  Temp(Src) 97.7 F (36.5 C) (Oral)  Resp 20  SpO2 100% Physical Exam  Constitutional: She is oriented to person, place, and time. She appears well-developed and well-nourished. No distress.  HENT:  Head: Normocephalic and atraumatic.  Eyes: EOM are normal.  Neck: Normal range of motion. Neck  supple.  Cardiovascular: Normal rate.   Pulmonary/Chest: Effort normal. No respiratory distress.  Musculoskeletal:  Marked tenderness across the right trapezius and along the posterior trapezius musculature as well as the supra scapular muscles. There is tenderness to the deltoid muscle, tricep and along the shoulder joint. She is able to abduct her arm to 120. Internal and external rotation produces pain in the tricep muscles. There is no shoulder asymmetry or deformity or discoloration.  Neurological: She is alert and oriented to person, place, and time. No cranial nerve deficit.  Skin: Skin is warm and dry.  Nursing note and vitals reviewed.   ED Course  Procedures (including critical care time) Labs Review Labs Reviewed - No data to display  Imaging Review No results found.   MDM   1. Myofasciitis   2. Shoulder pain, right    Arm sling just a few days. Remove it periodically to move the shoulder muscles around. Use heat periodically Solu-Medrol 125 mg IM to the right deltoid muscle administered by the provider Tramadol 50 mg every 4H when necessary pain #15 Follow-up with your PCP as an issue can.        Janne Napoleon, NP 11/01/14 1845

## 2014-11-01 NOTE — ED Notes (Signed)
C/o right shoulder pain that has gotten significantly worse over the past three days.  States that she had an x-ray done that showed bursitis.  Pt is scheduled with regular doctor on the 23rd.   Pt has not tried any otc pain meds.

## 2014-11-01 NOTE — Discharge Instructions (Signed)
Myofascial Pain Syndrome  Myofascial pain (MP) is one of the most common causes of discomfort. This pain may be felt in the muscles. It may come and go. MP always has trigger or tender points in the muscle that will cause pain when pressed. HOME CARE   Learn to do gentle exercise. Stretch.  Change your position every hour.  Eat a healthy diet.  Find ways to relax and lessen stress.  Avoid things that make your pain worse.  Get a massage by someone trained to release trigger or tender points.  Only take medicine as told by your doctor.  Get follow-up care as needed. GET HELP RIGHT AWAY IF:   Your pain suddenly gets worse.  You are not able to move your arms or legs.  You have pain in your chest.  It is hard to breathe. MAKE SURE YOU:  Understand these instructions.  Will watch your condition.  Will get help right away if you are not doing well or get worse. Document Released: 05/21/2008 Document Revised: 08/31/2011 Document Reviewed: 05/21/2008 [facility] Patient Information 2015 [place], [place]. This information is not intended to replace advice given to you by your health care provider. Make sure you discuss any questions you have with your health care provider.  Shoulder Pain The shoulder is the joint that connects your arm to your body. Muscles and band-like tissues that connect bones to muscles (tendons) hold the joint together. Shoulder pain is felt if an injury or medical problem affects one or more parts of the shoulder. HOME CARE   Put ice on the sore area.  Put ice in a plastic bag.  Place a towel between your skin and the bag.  Leave the ice on for 15-20 minutes, 03-04 times a day for the first 2 days.  Stop using cold packs if they do not help with the pain.  If you were given something to keep your shoulder from moving (sling; shoulder immobilizer), wear it as told. Only take it off to shower or bathe.  Move your arm as little as possible, but keep your  hand moving to prevent puffiness (swelling).  Squeeze a soft ball or foam pad as much as possible to help prevent swelling.  Take medicine as told by your doctor. GET HELP IF:  You have progressing new pain in your arm, hand, or fingers.  Your hand or fingers get cold.  Your medicine does not help lessen your pain. GET HELP RIGHT AWAY IF:   Your arm, hand, or fingers are numb or tingling.  Your arm, hand, or fingers are puffy (swollen), painful, or turn white or blue. MAKE SURE YOU:   Understand these instructions.  Will watch your condition.  Will get help right away if you are not doing well or get worse. Document Released: 11/25/2007 Document Revised: 10/23/2013 Document Reviewed: 12/21/2011 [facility] Patient Information 2015 [place], [place]. This information is not intended to replace advice given to you by your health care provider. Make sure you discuss any questions you have with your health care provider.

## 2014-11-01 NOTE — Telephone Encounter (Signed)
Dr. Jenny Reichmann.  I have scheduled patient for 23rd.  Please see notes below.  Patient states she is in need of an injection.  She is also requesting you to do an outside referral to see if she can get in anywhere else sooner.  Please advise.

## 2014-11-11 ENCOUNTER — Emergency Department (HOSPITAL_COMMUNITY)
Admission: EM | Admit: 2014-11-11 | Discharge: 2014-11-11 | Disposition: A | Discharge status: Home / Self Care | Payer: PPO | Attending: Emergency Medicine | Admitting: Emergency Medicine

## 2014-11-11 ENCOUNTER — Encounter (HOSPITAL_COMMUNITY): Payer: Self-pay | Admitting: Emergency Medicine

## 2014-11-11 DIAGNOSIS — N390 Urinary tract infection, site not specified: Secondary | ICD-10-CM | POA: Diagnosis not present

## 2014-11-11 DIAGNOSIS — G8929 Other chronic pain: Secondary | ICD-10-CM | POA: Diagnosis not present

## 2014-11-11 DIAGNOSIS — K219 Gastro-esophageal reflux disease without esophagitis: Secondary | ICD-10-CM | POA: Insufficient documentation

## 2014-11-11 DIAGNOSIS — Z8742 Personal history of other diseases of the female genital tract: Secondary | ICD-10-CM | POA: Insufficient documentation

## 2014-11-11 DIAGNOSIS — R011 Cardiac murmur, unspecified: Secondary | ICD-10-CM | POA: Insufficient documentation

## 2014-11-11 DIAGNOSIS — M159 Polyosteoarthritis, unspecified: Secondary | ICD-10-CM | POA: Insufficient documentation

## 2014-11-11 DIAGNOSIS — E559 Vitamin D deficiency, unspecified: Secondary | ICD-10-CM | POA: Diagnosis not present

## 2014-11-11 DIAGNOSIS — E1142 Type 2 diabetes mellitus with diabetic polyneuropathy: Secondary | ICD-10-CM | POA: Insufficient documentation

## 2014-11-11 DIAGNOSIS — G43909 Migraine, unspecified, not intractable, without status migrainosus: Secondary | ICD-10-CM | POA: Diagnosis not present

## 2014-11-11 DIAGNOSIS — J449 Chronic obstructive pulmonary disease, unspecified: Secondary | ICD-10-CM | POA: Diagnosis not present

## 2014-11-11 DIAGNOSIS — Z862 Personal history of diseases of the blood and blood-forming organs and certain disorders involving the immune mechanism: Secondary | ICD-10-CM | POA: Insufficient documentation

## 2014-11-11 DIAGNOSIS — Z87891 Personal history of nicotine dependence: Secondary | ICD-10-CM | POA: Diagnosis not present

## 2014-11-11 DIAGNOSIS — Z8659 Personal history of other mental and behavioral disorders: Secondary | ICD-10-CM | POA: Insufficient documentation

## 2014-11-11 DIAGNOSIS — I10 Essential (primary) hypertension: Secondary | ICD-10-CM | POA: Diagnosis not present

## 2014-11-11 DIAGNOSIS — Z794 Long term (current) use of insulin: Secondary | ICD-10-CM | POA: Insufficient documentation

## 2014-11-11 DIAGNOSIS — Z880 Allergy status to penicillin: Secondary | ICD-10-CM | POA: Insufficient documentation

## 2014-11-11 DIAGNOSIS — R350 Frequency of micturition: Secondary | ICD-10-CM | POA: Diagnosis present

## 2014-11-11 LAB — URINALYSIS, ROUTINE W REFLEX MICROSCOPIC
Bilirubin Urine: NEGATIVE
KETONES UR: NEGATIVE mg/dL
Nitrite: NEGATIVE
Protein, ur: NEGATIVE mg/dL
SPECIFIC GRAVITY, URINE: 1.016 (ref 1.005–1.030)
Urobilinogen, UA: 0.2 mg/dL (ref 0.0–1.0)
pH: 5.5 (ref 5.0–8.0)

## 2014-11-11 LAB — COMPREHENSIVE METABOLIC PANEL
ALBUMIN: 3.3 g/dL — AB (ref 3.5–5.0)
ALT: 15 U/L (ref 14–54)
AST: 21 U/L (ref 15–41)
Alkaline Phosphatase: 98 U/L (ref 38–126)
Anion gap: 9 (ref 5–15)
BILIRUBIN TOTAL: 0.7 mg/dL (ref 0.3–1.2)
BUN: 29 mg/dL — ABNORMAL HIGH (ref 6–20)
CALCIUM: 9.8 mg/dL (ref 8.9–10.3)
CHLORIDE: 99 mmol/L — AB (ref 101–111)
CO2: 26 mmol/L (ref 22–32)
Creatinine, Ser: 1.77 mg/dL — ABNORMAL HIGH (ref 0.44–1.00)
GFR calc Af Amer: 34 mL/min — ABNORMAL LOW (ref >60)
GFR calc non Af Amer: 29 mL/min — ABNORMAL LOW (ref >60)
GLUCOSE: 368 mg/dL — AB (ref 65–99)
POTASSIUM: 5.3 mmol/L — AB (ref 3.5–5.1)
SODIUM: 134 mmol/L — AB (ref 135–145)
Total Protein: 7.8 g/dL (ref 6.5–8.1)

## 2014-11-11 LAB — I-STAT CHEM 8, ED
BUN: 30 mg/dL — ABNORMAL HIGH (ref 6–20)
CALCIUM ION: 1.21 mmol/L (ref 1.13–1.30)
Chloride: 100 mmol/L — ABNORMAL LOW (ref 101–111)
Creatinine, Ser: 1.5 mg/dL — ABNORMAL HIGH (ref 0.44–1.00)
Glucose, Bld: 268 mg/dL — ABNORMAL HIGH (ref 65–99)
HCT: 44 % (ref 36.0–46.0)
HEMOGLOBIN: 15 g/dL (ref 12.0–15.0)
Potassium: 4.4 mmol/L (ref 3.5–5.1)
Sodium: 138 mmol/L (ref 135–145)
TCO2: 23 mmol/L (ref 0–100)

## 2014-11-11 LAB — CBC WITH DIFFERENTIAL/PLATELET
BASOS ABS: 0 10*3/uL (ref 0.0–0.1)
BASOS PCT: 1 % (ref 0–1)
EOS ABS: 0.1 10*3/uL (ref 0.0–0.7)
Eosinophils Relative: 2 % (ref 0–5)
HEMATOCRIT: 39.9 % (ref 36.0–46.0)
Hemoglobin: 12.9 g/dL (ref 12.0–15.0)
Lymphocytes Relative: 36 % (ref 12–46)
Lymphs Abs: 2.3 10*3/uL (ref 0.7–4.0)
MCH: 26.7 pg (ref 26.0–34.0)
MCHC: 32.3 g/dL (ref 30.0–36.0)
MCV: 82.4 fL (ref 78.0–100.0)
MONO ABS: 0.6 10*3/uL (ref 0.1–1.0)
Monocytes Relative: 10 % (ref 3–12)
NEUTROS PCT: 51 % (ref 43–77)
Neutro Abs: 3.3 10*3/uL (ref 1.7–7.7)
PLATELETS: 248 10*3/uL (ref 150–400)
RBC: 4.84 MIL/uL (ref 3.87–5.11)
RDW: 15.6 % — ABNORMAL HIGH (ref 11.5–15.5)
WBC: 6.4 10*3/uL (ref 4.0–10.5)

## 2014-11-11 LAB — I-STAT TROPONIN, ED: TROPONIN I, POC: 0 ng/mL (ref 0.00–0.08)

## 2014-11-11 LAB — URINE MICROSCOPIC-ADD ON

## 2014-11-11 LAB — CBG MONITORING, ED: Glucose-Capillary: 275 mg/dL — ABNORMAL HIGH (ref 65–99)

## 2014-11-11 MED ORDER — DEXTROSE 5 % IV SOLN
1.0000 g | Freq: Once | INTRAVENOUS | Status: AC
  Administered 2014-11-11: 1 g via INTRAVENOUS
  Filled 2014-11-11: qty 10

## 2014-11-11 MED ORDER — DICYCLOMINE HCL 20 MG PO TABS
20.0000 mg | ORAL_TABLET | Freq: Once | ORAL | Status: AC
  Administered 2014-11-11: 20 mg via ORAL
  Filled 2014-11-11: qty 1

## 2014-11-11 MED ORDER — ACETAMINOPHEN 500 MG PO TABS
1000.0000 mg | ORAL_TABLET | Freq: Once | ORAL | Status: AC
  Administered 2014-11-11: 1000 mg via ORAL
  Filled 2014-11-11: qty 2

## 2014-11-11 MED ORDER — SODIUM CHLORIDE 0.9 % IV BOLUS (SEPSIS)
1000.0000 mL | Freq: Once | INTRAVENOUS | Status: AC
  Administered 2014-11-11: 1000 mL via INTRAVENOUS

## 2014-11-11 MED ORDER — CEPHALEXIN 500 MG PO CAPS: 500.0000 mg | ORAL_CAPSULE | Freq: Four times a day (QID) | ORAL | Status: DC

## 2014-11-11 MED ORDER — PHENAZOPYRIDINE HCL 200 MG PO TABS: 200.0000 mg | ORAL_TABLET | Freq: Three times a day (TID) | ORAL | Status: DC | PRN

## 2014-11-11 MED ORDER — SODIUM CHLORIDE 0.9 % IV BOLUS (SEPSIS): 1000.0000 mL | Freq: Once | INTRAVENOUS | Status: DC

## 2014-11-11 NOTE — ED Provider Notes (Signed)
CSN: [id number]     Arrival date & time 11/11/14  1406 History   First MD Initiated Contact with Patient 11/11/14 1554     Chief Complaint  Patient presents with  . Urinary Frequency     (Consider location/radiation/quality/duration/timing/severity/associated sxs/prior Treatment) HPI Christy May Is a 64 year old female with a past medical history of morbid obesity, diabetes, hypertension who presents emergency Department with chief complaint of 2 days of dysuria and frequency of urination. Patient states Christy May is urinating frequently because Christy May had a cortisone injection in her shoulder on May 12 and her blood sugars have been elevated. The patient has been urinating frequently, Christy May's had urgency and frequency of urination and states that Christy May has had large volume urine. Christy May denies fevers, chills, myalgias, arthralgias, flank pain, or other signs of systemic infection. Past Medical History  Diagnosis Date  . Anemia, unspecified   . Dehydration   . Depressive disorder, not elsewhere classified   . Esophageal reflux   . Unspecified essential hypertension   . Unspecified menopausal and postmenopausal disorder   . Obesity, unspecified   . Inflammatory and toxic neuropathy, unspecified   . Unspecified vitamin D deficiency   . Dysphagia 2007    historyof dysphagia with severe dysmotility by barium swallow-[name]  . Obesity   . Hypertension   . Allergic bronchitis   . Complication of anesthesia     "I've had recall; I probably stopped breathing at least 2 times" (09/03/2014)  . Family history of adverse reaction to anesthesia     "my brother was told they lost him a couple times during OR"  . OSA (obstructive sleep apnea)     "stopped using my CPAP; just couldn't use it cause it kept me awake and I couldn't keep it on my face'"(09/03/2014)  . Heart murmur   . Chronic airway obstruction, not elsewhere classified     "I was told I don't have this/tests done @ [facility] 05/2014"  .  Extrinsic asthma, unspecified     "I was told I don't have this/tests done @ [facility] 05/2014"  . Type II diabetes mellitus   . History of hiatal hernia   . Headache     "weekly to monthly" (09/03/2014)  . Migraine     "couple times/month right now" (09/03/2014)  . Spondylosis of unspecified site without mention of myelopathy   . Arthritis     "neck, back, hands" (09/03/2014)  . Diabetic peripheral neuropathy   . Chronic back pain    Past Surgical History  Procedure Laterality Date  . Refractive surgery Bilateral   . Anterior cervical decomp/discectomy fusion      multiple cervical spine levels;Dr. Arnoldo Morale  . Incision and drainage abscess      Chest  . Dilation and curettage of uterus    . Exploratory laparotomy    . Total knee arthroplasty Left 10/2003  . Total knee arthroplasty Right 01/2004    [name]  . Foot surgery Right X 2    "took blood out of my arms and put platelets in my feet"  . Appendectomy    . Tonsillectomy    . Esophagogastroduodenoscopy N/A 05/08/2014    Procedure: ESOPHAGOGASTRODUODENOSCOPY (EGD);  Surgeon: [name], MD;  Location: [name];  Service: Endoscopy;  Laterality: N/A;  . Colonoscopy N/A 05/08/2014    Procedure: COLONOSCOPY;  Surgeon: [name], MD;  Location: [facility];  Service: Endoscopy;  Laterality: N/A;  . Back surgery    . Cholecystectomy  open    . Abdominal hysterectomy      "partial"  . Eye surgery    . Joint replacement     Family History  Problem Relation Age of Onset  . Esophageal cancer Brother   . Esophageal cancer Sister     ?  . Colon polyps Brother   . Pancreatic cancer Sister     ?  . Diabetes Sister   . Diabetes      Aunt and Uncle  . Heart disease Maternal Grandfather    History  Substance Use Topics  . Smoking status: Former Smoker -- 1.00 packs/day for 20 years    Types: Cigarettes    Quit date: 06/22/1986  . Smokeless tobacco: Never Used  . Alcohol Use: Yes     Comment: "stopped drinking in ~  1983"   OB History    No data available     Review of Systems  Ten systems reviewed and are negative for acute change, except as noted in the HPI.    Allergies  Ace inhibitors; Penicillins; Tape; and Adhesive  Home Medications   Prior to Admission medications   Medication Sig Start Date End Date Taking? Authorizing Provider  albuterol (PROVENTIL) (2.5 MG/3ML) 0.083% nebulizer solution Take 2.5 mg by nebulization every 6 (six) hours as needed for wheezing or shortness of breath.   Yes Historical Provider, MD  aspirin 81 MG tablet Take 81 mg by mouth at bedtime.    Yes Historical Provider, MD  cholecalciferol (VITAMIN D) 1000 UNITS tablet Take 2,000 Units by mouth every morning.    Yes Historical Provider, MD  cyclobenzaprine (FLEXERIL) 5 MG tablet TAKE ONE TABLET BY MOUTH THREE TIMES DAILY AS NEEDED FOR MUSCLE SPASMS 07/24/14  Yes Biagio Borg, MD  diclofenac sodium (VOLTAREN) 1 % GEL Apply 4 g topically 4 (four) times daily as needed (right hand, arm and neck pain.).   Yes Historical Provider, MD  famotidine (PEPCID) 20 MG tablet TAKE 1 TABLET BY MOUTH EVERY NIGHT AT BEDTIME 09/21/14  Yes Collene Gobble, MD  fluticasone (FLONASE) 50 MCG/ACT nasal spray Place 2 sprays into both nostrils every morning. 04/26/14  Yes Collene Gobble, MD  gabapentin (NEURONTIN) 800 MG tablet TAKE 1 TABLET BY MOUTH THREE TIMES DAILY 06/27/14  Yes Biagio Borg, MD  HYDROcodone-homatropine [facility]) 5-1.5 MG/5ML syrup Take 5 mLs by mouth every 6 (six) hours as needed for cough. 10/30/14  Yes Collene Gobble, MD  Insulin NPH Human, Isophane, (HUMULIN N PEN [place]) Inject 130 Units into the skin daily.   Yes Historical Provider, MD  irbesartan (AVAPRO) 300 MG tablet TAKE 1 TABLET BY MOUTH DAILY 08/21/14  Yes Biagio Borg, MD  Lancets [facility] ULTRASOFT) lancets Use to test blood sugars twice a day. 07/20/13  Yes Renato Shin, MD  loratadine (CLARITIN) 10 MG tablet TAKE 1 TABLET BY MOUTH EVERY DAY 07/26/14  Yes Collene Gobble, MD   metoprolol (LOPRESSOR) 50 MG tablet Take 75 mg by mouth 2 (two) times daily.   Yes Historical Provider, MD  montelukast (SINGULAIR) 10 MG tablet Take 10 mg by mouth at bedtime.   Yes Historical Provider, MD  nystatin cream (MYCOSTATIN) Apply to affected area 2 times daily Patient taking differently: Apply 1 application topically daily as needed for dry skin.  12/06/13  Yes Biagio Borg, MD  ONE TOUCH ULTRA TEST test strip USE TO TEST BLOOD SUGARS TWICE DAILY 09/21/14  Yes Renato Shin, MD  NOVOLIN N 100  UNIT/ML injection Inject 130 Units into the skin every morning. 10/25/14   Historical Provider, MD  omeprazole (PRILOSEC) 40 MG capsule Take 1 capsule (40 mg total) by mouth daily. 10/30/14   Collene Gobble, MD  ondansetron (ZOFRAN ODT) 4 MG disintegrating tablet Take 1 tablet (4 mg total) by mouth every 8 (eight) hours as needed for nausea or vomiting. 08/30/14   Delice Bison Ward, DO  oxybutynin (DITROPAN-XL) 10 MG 24 hr tablet TAKE 1 TABLET BY MOUTH DAILY 08/13/14   Biagio Borg, MD  promethazine (PHENERGAN) 25 MG tablet TAKE 1 TABLET BY MOUTH EVERY 6 HOURS AS NEEDED FOR NAUSEA OR VOMITING 11/02/14   Biagio Borg, MD  traMADol (ULTRAM) 50 MG tablet Take 1 tablet (50 mg total) by mouth every 6 (six) hours as needed. 11/01/14   Janne Napoleon, NP   BP 94/54 mmHg  Pulse 91  Temp(Src) 98.5 F (36.9 C) (Oral)  Resp 20  Ht 5' 4.25" (1.632 m)  Wt 309 lb (140.161 kg)  BMI 52.62 kg/m2  SpO2 99% Physical Exam  Constitutional: Christy May is oriented to person, place, and time. Christy May appears well-developed and well-nourished. No distress.  HENT:  Head: Normocephalic and atraumatic.  Eyes: Conjunctivae are normal. No scleral icterus.  Neck: Normal range of motion.  Cardiovascular: Normal rate, regular rhythm and normal heart sounds.  Exam reveals no gallop and no friction rub.   No murmur heard. Pulmonary/Chest: Effort normal and breath sounds normal. No respiratory distress.  Abdominal: Soft. Bowel sounds are normal.  Christy May exhibits no distension and no mass. There is no tenderness. There is no guarding.  No CVA tenderness  Neurological: Christy May is alert and oriented to person, place, and time.  Skin: Skin is warm and dry. Christy May is not diaphoretic.  Nursing note and vitals reviewed.   ED Course  Procedures (including critical care time) Labs Review Labs Reviewed  CBC WITH DIFFERENTIAL/PLATELET - Abnormal; Notable for the following:    RDW 15.6 (*)    All other components within normal limits  URINALYSIS, ROUTINE W REFLEX MICROSCOPIC  COMPREHENSIVE METABOLIC PANEL    Imaging Review No results found.   EKG Interpretation None      MDM   Final diagnoses:  UTI (lower urinary tract infection)    Patient with urinary tract infection. Her creatinine was more elevated along with BUN, suggesting dehydration up. Hyperglycemia with so the elevated potassium. Christy May was initially mildly hypotensive. Christy May is not hemodynamically stable. Her potassium and her creatinine have improved. Patient appears safe for discharge at this time. DC with Keflex and Bactrim. Seen in shared visit with attending physician.    Margarita Mail, PA-C 11/11/14 1949  Ezequiel Essex, MD 11/12/14 [phone]

## 2014-11-11 NOTE — Discharge Instructions (Signed)
You have been seen today for your complaint of pain with urination. Your lab work showed urine infection. Your discharge medications include: 1) keflex Please take all of your antibiotics until finished!   You may develop abdominal discomfort or diarrhea from the antibiotic.  You may help offset this with probiotics which you can buy or get in yogurt. Do not eat  or take the probiotics until 2 hours after your antibiotic.  2) Pyridium This medication will help relieve pain and burning but does not treat the infection.  Make sure that you wear a panty liner as it may stain your underwear. Home care instructions are as follows:  1) please drink plenty of water, avoid tea and beverages with caffeine like coffee or soda 2) if you are sexually active, ,make sure to urinate immediately after intercourse Follow up with: your doctor or the emergency department Please seek immediate medical care if you develop any of the following symptoms: SEEK MEDICAL CARE IF:  You have back pain.  You develop a fever.  Your symptoms do not begin to resolve within 3 days.  SEEK IMMEDIATE MEDICAL CARE IF:  You have severe back pain or lower abdominal pain.  You develop chills.  You have nausea or vomiting.  You have continued burning or discomfort with urination.

## 2014-11-11 NOTE — ED Notes (Signed)
Pt c/o increase urination x 2 days. Pt has been drinking a lot of water lately. Painful urination also.

## 2014-11-12 ENCOUNTER — Ambulatory Visit: Payer: PPO | Admitting: Family Medicine

## 2014-11-12 DIAGNOSIS — Z0289 Encounter for other administrative examinations: Secondary | ICD-10-CM

## 2014-11-13 ENCOUNTER — Telehealth: Payer: Self-pay | Admitting: Family Medicine

## 2014-11-13 NOTE — Telephone Encounter (Signed)
Patient no showed for ov on 5/23.  Please advise.

## 2014-11-14 NOTE — Telephone Encounter (Signed)
Noted  

## 2014-11-15 ENCOUNTER — Telehealth: Payer: Self-pay

## 2014-11-15 NOTE — Telephone Encounter (Signed)
Patient called. Yesterday patient blood sugar was 399 and 372. Patient stated she had a steroid shot last week for her shoulder. Patient has a appointment with Dr. Loanne Drilling tomorrow at 73. She is taking 130 units in the morning and today her blood sugar was 372. Could you please advise if any adjustments should be done between now and tomorrows office visit.

## 2014-11-15 NOTE — Telephone Encounter (Signed)
Patient stated that her b/p is running high yesterday it was 399, today 379, she has also been in the hospital for a UTI, please advise

## 2014-11-15 NOTE — Telephone Encounter (Signed)
She should take additional 50 units of NPH insulin now and take 150 units tomorrow morning unless blood sugar comes down significantly

## 2014-11-16 ENCOUNTER — Telehealth: Payer: Self-pay | Admitting: Internal Medicine

## 2014-11-16 ENCOUNTER — Ambulatory Visit (INDEPENDENT_AMBULATORY_CARE_PROVIDER_SITE_OTHER): Payer: PPO | Admitting: Endocrinology

## 2014-11-16 ENCOUNTER — Encounter: Payer: Self-pay | Admitting: Family Medicine

## 2014-11-16 ENCOUNTER — Encounter: Payer: Self-pay | Admitting: Endocrinology

## 2014-11-16 ENCOUNTER — Ambulatory Visit (INDEPENDENT_AMBULATORY_CARE_PROVIDER_SITE_OTHER): Payer: PPO | Admitting: Family Medicine

## 2014-11-16 VITALS — BP 136/88 | HR 104 | Ht 64.5 in | Wt 301.0 lb

## 2014-11-16 VITALS — BP 120/70 | HR 96 | Temp 98.2°F | Wt 301.0 lb

## 2014-11-16 DIAGNOSIS — E1041 Type 1 diabetes mellitus with diabetic mononeuropathy: Secondary | ICD-10-CM

## 2014-11-16 DIAGNOSIS — IMO0002 Reserved for concepts with insufficient information to code with codable children: Secondary | ICD-10-CM

## 2014-11-16 DIAGNOSIS — R300 Dysuria: Secondary | ICD-10-CM

## 2014-11-16 DIAGNOSIS — M546 Pain in thoracic spine: Secondary | ICD-10-CM

## 2014-11-16 DIAGNOSIS — E1049 Type 1 diabetes mellitus with other diabetic neurological complication: Secondary | ICD-10-CM

## 2014-11-16 DIAGNOSIS — M549 Dorsalgia, unspecified: Secondary | ICD-10-CM

## 2014-11-16 DIAGNOSIS — E1065 Type 1 diabetes mellitus with hyperglycemia: Principal | ICD-10-CM

## 2014-11-16 LAB — POCT URINALYSIS DIPSTICK
Bilirubin, UA: NEGATIVE
KETONES UA: NEGATIVE
NITRITE UA: NEGATIVE
PH UA: 5.5
Spec Grav, UA: 1.015
Urobilinogen, UA: 0.2

## 2014-11-16 LAB — HEMOGLOBIN A1C: Hgb A1c MFr Bld: 10.8 % — ABNORMAL HIGH (ref 4.6–6.5)

## 2014-11-16 MED ORDER — TRAMADOL HCL 50 MG PO TABS: 50.0000 mg | ORAL_TABLET | Freq: Four times a day (QID) | ORAL | Status: DC | PRN

## 2014-11-16 NOTE — Progress Notes (Signed)
Pre visit review using our clinic review tool, if applicable. No additional management support is needed unless otherwise documented below in the visit note. 

## 2014-11-16 NOTE — Telephone Encounter (Signed)
[facility]  Patient Name: Christy May  Gender: Female  DOB: [birth year]   Age: 64 Y 11 M 26 D  Return Phone Number: [phone] (Primary)  Address:   City/State/Zip: [place]    [facility]  Client Site [facility]  Contact Type Call  Call Type Triage / Clinical  Caller Name [name]  Relationship To Patient Self  Appointment Disposition EMR Appointment Scheduled  Info pasted into Epic Yes  Return Phone Number [phone] (Primary)  Chief Complaint ABDOMINAL PAIN - Severe and only in abdomen  Initial Comment [name] is having mild facial pain, and shoulder pain. She is being treated for a UTI. She is in so much pain, having trouble sitting. She is at her Diabetic Doctor, they wanted her to call her FP regarding whats going on. Severe abdominal pain.  GOTO Facility Not Listed dr Elease Hashimoto at 3:45  PreDisposition Call Doctor   Nurse Assessment  Nurse: Buck Mam, RN, Trish Date/Time Eilene Ghazi Time): 11/16/2014 1:44:09 PM  Confirm and document reason for call. If symptomatic, describe symptoms. ---Patient is [name] is having mild facial pain, and shoulder pain. She is being treated for a UTI. She is in so much pain, having trouble sitting. She is at her Diabetic Doctor, they wanted her to call her FP regarding what's going on. Severe abdominal pain. Is on Keflex and is not helping.  Has the patient traveled out of the country within the last 30 days? ---No  Does the patient require triage? ---Yes  Related visit to physician within the last 2 weeks? ---Yes   diabetic dr and was seen at the ER 05/22  Does the PT have any chronic conditions? (i.e. diabetes, asthma, etc.) ---Yes  List chronic conditions. ---diabetic hypertension bronchitis.     Guidelines      Guideline Title Affirmed Question Affirmed Notes Nurse Date/Time Eilene Ghazi  Time)  Urinary Tract Infection on Antibiotic Follow-up Call - Female [1] SEVERE pain (e.g., excruciating) AND [2] no improvement 2 hours after pain medications  Buck Mam, RN, Trish 11/16/2014 1:46:42 PM   Disp. Time Eilene Ghazi Time) Disposition Final User   11/16/2014 1:39:23 PM Send to Urgent Queue  Baruch Goldmann    11/16/2014 1:53:18 PM See Physician within 4 Hours (or PCP triage) Yes Buck Mam, RN, Particia Lather Understands: Yes  Disagree/Comply: Comply     Care Advice Given Per Guideline      SEE PHYSICIAN WITHIN 4 HOURS (or PCP triage): CALL BACK IF: * You become worse. CARE ADVICE given per Urinary Tract Infection Follow-Up Call, Female (Adult) guideline. ACETAMINOPHEN (E.G., TYLENOL): * Take 650 mg (two 325 mg pills) by mouth every 4-6 hours as needed. Each Regular Strength Tylenol pill has 325 mg of acetaminophen. The most you should take each day is 3,250 mg (10 Regular Strength pills a day).   After Care Instructions Given     Call Event Type User Date / Time Description        Comments  User: Rubie Maid, RN Date/Time (Eastern Time): 11/16/2014 1:59:23 PM  5/12 seen in urgent care and was given medication for pain for shoulder also given abx shot. Then went to ER on the 22 was put on keflex for uti was given 6-7 pills for pain. Has an appointment June 10 th With PCP. She state I need  to be evaluated today for the pain and the UTI is not going away.   Referrals  REFERRED TO PCP OFFICE

## 2014-11-16 NOTE — Telephone Encounter (Signed)
See below note to be advised. I contacted the patient this morning and advised per Dr. Dwyane Dee to take 150 units this morning when she checks her blood sugar. I was unable to reach the patient yesterday to advise on taking the 50 units. Patient is coming in for a appointment today at 215.

## 2014-11-16 NOTE — Telephone Encounter (Signed)
appt scheduled 5.27.2016 with Dr. Elease Hashimoto

## 2014-11-16 NOTE — Progress Notes (Signed)
Subjective:    Patient ID: Christy May, female    DOB: Mar 22, 1951, 64 y.o.   MRN: [id number]  HPI Pt returns for f/u of diabetes mellitus: DM type: Insulin-requiring type 2 Dx'ed: 6295 Complications: none Therapy: insulin since 2002.  GDM: never DKA: never Severe hypoglycemia: never. Pancreatitis: never Other: she was changed to a simpler qd insulin regimen, after poor results on multiple daily injections; she wants the cheapest possible insulin.  She declines weight loss surgery. Interval history: No recent steroids, except an injection on 11/01/14.  She has not recently checked cbg's.  She says chronic pain syndrome is complicating her ability to care for her DM.   Past Medical History  Diagnosis Date  . Anemia, unspecified   . Dehydration   . Depressive disorder, not elsewhere classified   . Esophageal reflux   . Unspecified essential hypertension   . Unspecified menopausal and postmenopausal disorder   . Obesity, unspecified   . Inflammatory and toxic neuropathy, unspecified   . Unspecified vitamin D deficiency   . Dysphagia 2007    historyof dysphagia with severe dysmotility by barium swallow-[name]  . Obesity   . Hypertension   . Allergic bronchitis   . Complication of anesthesia     "I've had recall; I probably stopped breathing at least 2 times" (09/03/2014)  . Family history of adverse reaction to anesthesia     "my brother was told they lost him a couple times during OR"  . OSA (obstructive sleep apnea)     "stopped using my CPAP; just couldn't use it cause it kept me awake and I couldn't keep it on my face'"(09/03/2014)  . Heart murmur   . Chronic airway obstruction, not elsewhere classified     "I was told I don't have this/tests done @ [facility] 05/2014"  . Extrinsic asthma, unspecified     "I was told I don't have this/tests done @ [facility] 05/2014"  . Type II diabetes mellitus   . History of hiatal hernia   . Headache     "weekly to monthly"  (09/03/2014)  . Migraine     "couple times/month right now" (09/03/2014)  . Spondylosis of unspecified site without mention of myelopathy   . Arthritis     "neck, back, hands" (09/03/2014)  . Diabetic peripheral neuropathy   . Chronic back pain     Past Surgical History  Procedure Laterality Date  . Refractive surgery Bilateral   . Anterior cervical decomp/discectomy fusion      multiple cervical spine levels;Dr. Arnoldo Morale  . Incision and drainage abscess      Chest  . Dilation and curettage of uterus    . Exploratory laparotomy    . Total knee arthroplasty Left 10/2003  . Total knee arthroplasty Right 01/2004    [name]  . Foot surgery Right X 2    "took blood out of my arms and put platelets in my feet"  . Appendectomy    . Tonsillectomy    . Esophagogastroduodenoscopy N/A 05/08/2014    Procedure: ESOPHAGOGASTRODUODENOSCOPY (EGD);  Surgeon: [name], MD;  Location: [name];  Service: Endoscopy;  Laterality: N/A;  . Colonoscopy N/A 05/08/2014    Procedure: COLONOSCOPY;  Surgeon: [name], MD;  Location: [facility];  Service: Endoscopy;  Laterality: N/A;  . Back surgery    . Cholecystectomy open    . Abdominal hysterectomy      "partial"  . Eye surgery    . Joint replacement  History   Social History  . Marital Status: Widowed    Spouse Name: N/A  . Number of Children: N/A  . Years of Education: N/A   Occupational History  . disabled    Social History Main Topics  . Smoking status: Former Smoker -- 1.00 packs/day for 20 years    Types: Cigarettes    Quit date: 06/22/1986  . Smokeless tobacco: Never Used  . Alcohol Use: Yes     Comment: "stopped drinking in ~ 1983"  . Drug Use: No  . Sexual Activity: No   Other Topics Concern  . Not on file   Social History Narrative   ** Merged History Encounter **       Patient does not get regular exercise.      Widowed 2004      Disabled    Current Outpatient Prescriptions on File Prior to  Visit  Medication Sig Dispense Refill  . albuterol (PROVENTIL) (2.5 MG/3ML) 0.083% nebulizer solution Take 2.5 mg by nebulization every 6 (six) hours as needed for wheezing or shortness of breath.    Marland Kitchen aspirin 81 MG tablet Take 81 mg by mouth at bedtime.     . cephALEXin (KEFLEX) 500 MG capsule Take 1 capsule (500 mg total) by mouth 4 (four) times daily. 40 capsule 0  . cholecalciferol (VITAMIN D) 1000 UNITS tablet Take 2,000 Units by mouth every morning.     . cyclobenzaprine (FLEXERIL) 5 MG tablet TAKE ONE TABLET BY MOUTH THREE TIMES DAILY AS NEEDED FOR MUSCLE SPASMS 90 tablet 0  . diclofenac sodium (VOLTAREN) 1 % GEL Apply 4 g topically 4 (four) times daily as needed (right hand, arm and neck pain.).    Marland Kitchen famotidine (PEPCID) 20 MG tablet TAKE 1 TABLET BY MOUTH EVERY NIGHT AT BEDTIME 30 tablet 5  . fluticasone (FLONASE) 50 MCG/ACT nasal spray Place 2 sprays into both nostrils every morning. 16 g 6  . gabapentin (NEURONTIN) 800 MG tablet TAKE 1 TABLET BY MOUTH THREE TIMES DAILY 270 tablet 1  . HYDROcodone-homatropine (HYCODAN) 5-1.5 MG/5ML syrup Take 5 mLs by mouth every 6 (six) hours as needed for cough. 180 mL 0  . irbesartan (AVAPRO) 300 MG tablet TAKE 1 TABLET BY MOUTH DAILY 90 tablet 0  . Lancets (ONETOUCH ULTRASOFT) lancets Use to test blood sugars twice a day. 100 each 12  . loratadine (CLARITIN) 10 MG tablet TAKE 1 TABLET BY MOUTH EVERY DAY 30 tablet 2  . metoprolol (LOPRESSOR) 50 MG tablet Take 75 mg by mouth 2 (two) times daily.    . montelukast (SINGULAIR) 10 MG tablet Take 10 mg by mouth at bedtime.    Marland Kitchen nystatin cream (MYCOSTATIN) Apply to affected area 2 times daily (Patient taking differently: Apply 1 application topically daily as needed for dry skin. ) 30 g 0  . omeprazole (PRILOSEC) 40 MG capsule Take 1 capsule (40 mg total) by mouth daily. 30 capsule 5  . ondansetron (ZOFRAN ODT) 4 MG disintegrating tablet Take 1 tablet (4 mg total) by mouth every 8 (eight) hours as needed for  nausea or vomiting. 20 tablet 0  . ONE TOUCH ULTRA TEST test strip USE TO TEST BLOOD SUGARS TWICE DAILY 100 each 2  . oxybutynin (DITROPAN-XL) 10 MG 24 hr tablet TAKE 1 TABLET BY MOUTH DAILY 90 tablet 1  . phenazopyridine (PYRIDIUM) 200 MG tablet Take 1 tablet (200 mg total) by mouth 3 (three) times daily as needed for pain. 6 tablet 0  . promethazine (  PHENERGAN) 25 MG tablet TAKE 1 TABLET BY MOUTH EVERY 6 HOURS AS NEEDED FOR NAUSEA OR VOMITING 30 tablet 0   No current facility-administered medications on file prior to visit.    Allergies  Allergen Reactions  . Ace Inhibitors Anaphylaxis  . Penicillins Hives and Rash  . Tape Hives    Paper tape is ok to use  . Adhesive [Tape] Rash    Family History  Problem Relation Age of Onset  . Esophageal cancer Brother   . Esophageal cancer Sister     ?  . Colon polyps Brother   . Pancreatic cancer Sister     ?  . Diabetes Sister   . Diabetes      Aunt and Uncle  . Heart disease Maternal Grandfather     BP 136/88 mmHg  Pulse 104  Ht 5' 4.5" (1.638 m)  Wt 301 lb (136.533 kg)  BMI 50.89 kg/m2  SpO2 91%  Review of Systems She denies hypoglycemia.  She has lost a few lbs.     Objective:   Physical Exam VITAL SIGNS:  See vs page GENERAL: no distress Pulses: dorsalis pedis intact bilat.   MSK: no deformity of the feet CV: no leg edema Skin:  no ulcer on the feet.  normal color and temp on the feet. Neuro: sensation is intact to touch on the feet   Lab Results  Component Value Date   HGBA1C 10.8* 11/16/2014       Assessment & Plan:  DM: glycemic control is worse Noncompliance with cbg recording, persistent: I'll work around this as best I can, which for now is to titrate insulin according to a1c.  Patient is advised the following: Patient Instructions  blood tests are requested for you today.  We'll let you know about the results. check your blood sugar twice a day.  vary the time of day when you check, between before  the 3 meals, and at bedtime.  also check if you have symptoms of your blood sugar being too high or too low.  please keep a record of the readings and bring it to your next appointment here.  You can write it on any piece of paper.  please call us sooner if your blood sugar goes below 70, or if you have a lot of readings over 200.  Please come back for a follow-up appointment in 3 months.    addendum: increase the insulin to 170 units each morning

## 2014-11-16 NOTE — Patient Instructions (Addendum)
blood tests are requested for you today.  We'll let you know about the results. check your blood sugar twice a day.  vary the time of day when you check, between before the 3 meals, and at bedtime.  also check if you have symptoms of your blood sugar being too high or too low.  please keep a record of the readings and bring it to your next appointment here.  You can write it on any piece of paper.  please call us sooner if your blood sugar goes below 70, or if you have a lot of readings over 200.  Please come back for a follow-up appointment in 3 months.

## 2014-11-16 NOTE — Progress Notes (Signed)
Subjective:    Patient ID: Christy May, female    DOB: 1951-02-28, 64 y.o.   MRN: [id number]  HPI Patient seen for 2 recent ER visits. She was seen initially back in the middle of month with right shoulder pain. She was given Solu-Medrol. Slight improvement. She was prescribed tramadol which helped. She complains of somewhat poorly localized pain radiating from the base of the right neck into the shoulder area and upper back. Denies any upper extremity weakness or numbness. Recent ER notes were reviewed for both visits.   Dysuria. Patient seen in ED with urine dipstick suggestive of UTI. Urine culture not done. She was given Rocephin and prescribed Keflex. Urine symptoms are some better. No fevers or chills. No nausea or vomiting.  Reviewed:  Past Medical History  Diagnosis Date  . Anemia, unspecified   . Dehydration   . Depressive disorder, not elsewhere classified   . Esophageal reflux   . Unspecified essential hypertension   . Unspecified menopausal and postmenopausal disorder   . Obesity, unspecified   . Inflammatory and toxic neuropathy, unspecified   . Unspecified vitamin D deficiency   . Dysphagia 2007    historyof dysphagia with severe dysmotility by barium swallow-[name]  . Obesity   . Hypertension   . Allergic bronchitis   . Complication of anesthesia     "I've had recall; I probably stopped breathing at least 2 times" (09/03/2014)  . Family history of adverse reaction to anesthesia     "my brother was told they lost him a couple times during OR"  . OSA (obstructive sleep apnea)     "stopped using my CPAP; just couldn't use it cause it kept me awake and I couldn't keep it on my face'"(09/03/2014)  . Heart murmur   . Chronic airway obstruction, not elsewhere classified     "I was told I don't have this/tests done @ [facility] 05/2014"  . Extrinsic asthma, unspecified     "I was told I don't have this/tests done @ [facility] 05/2014"  . Type II diabetes mellitus     . History of hiatal hernia   . Headache     "weekly to monthly" (09/03/2014)  . Migraine     "couple times/month right now" (09/03/2014)  . Spondylosis of unspecified site without mention of myelopathy   . Arthritis     "neck, back, hands" (09/03/2014)  . Diabetic peripheral neuropathy   . Chronic back pain    Past Surgical History  Procedure Laterality Date  . Refractive surgery Bilateral   . Anterior cervical decomp/discectomy fusion      multiple cervical spine levels;Dr. Arnoldo Morale  . Incision and drainage abscess      Chest  . Dilation and curettage of uterus    . Exploratory laparotomy    . Total knee arthroplasty Left 10/2003  . Total knee arthroplasty Right 01/2004    [name]  . Foot surgery Right X 2    "took blood out of my arms and put platelets in my feet"  . Appendectomy    . Tonsillectomy    . Esophagogastroduodenoscopy N/A 05/08/2014    Procedure: ESOPHAGOGASTRODUODENOSCOPY (EGD);  Surgeon: [name], MD;  Location: [name];  Service: Endoscopy;  Laterality: N/A;  . Colonoscopy N/A 05/08/2014    Procedure: COLONOSCOPY;  Surgeon: [name], MD;  Location: [facility];  Service: Endoscopy;  Laterality: N/A;  . Back surgery    . Cholecystectomy open    . Abdominal  hysterectomy      "partial"  . Eye surgery    . Joint replacement      reports that she quit smoking about 28 years ago. Her smoking use included Cigarettes. She has a 20 pack-year smoking history. She has never used smokeless tobacco. She reports that she drinks alcohol. She reports that she does not use illicit drugs. family history includes Colon polyps in her brother; Diabetes in her sister and another family member; Esophageal cancer in her brother and sister; Heart disease in her maternal grandfather; Pancreatic cancer in her sister. Allergies  Allergen Reactions  . Ace Inhibitors Anaphylaxis  . Penicillins Hives and Rash  . Tape Hives    Paper tape is ok to use  . Adhesive [Tape]  Rash      Review of Systems  Constitutional: Negative for fever and chills.  Respiratory: Negative for cough.   Cardiovascular: Negative for chest pain.  Gastrointestinal: Negative for abdominal pain.  Genitourinary: Negative for flank pain.  Musculoskeletal: Positive for back pain. Negative for joint swelling.  Skin: Negative for rash.       Objective:   Physical Exam  Constitutional: She appears well-developed and well-nourished.  Cardiovascular: Normal rate and regular rhythm.   Pulmonary/Chest: Effort normal and breath sounds normal. No respiratory distress. She has no wheezes. She has no rales.  Musculoskeletal:  Right shoulder pain with abduction. No biceps tenderness. No acromioclavicular tenderness. Mild right trapezius tenderness.  Neurological:  No focal weakness upper extremities          Assessment & Plan:  #1 recent probable UTI. Culture not done. Improving on Keflex. Finish out Keflex. Stay well-hydrated. Urine dipstick today shows improvement with only small leukocytes and trace blood #2 right upper back pain. She has poorly localized pain involving the right shoulder and right neck. Possible cervical nerve root impingement. Nonfocal exam neurologically. Refill tramadol for as needed use. Gentle range of motion for right shoulder. Follow-up with sports medicine if symptoms persist

## 2014-11-19 MED ORDER — NOVOLIN N 100 UNIT/ML ~~LOC~~ SUSP: 170.0000 [IU] | Freq: Every morning | SUBCUTANEOUS | Status: DC

## 2014-11-20 ENCOUNTER — Other Ambulatory Visit: Payer: Self-pay | Admitting: Internal Medicine

## 2014-11-21 ENCOUNTER — Telehealth: Payer: Self-pay | Admitting: *Deleted

## 2014-11-21 ENCOUNTER — Telehealth: Payer: Self-pay | Admitting: Endocrinology

## 2014-11-21 MED ORDER — INSULIN NPH (HUMAN) (ISOPHANE) 100 UNIT/ML ~~LOC~~ SUSP: SUBCUTANEOUS | Status: DC

## 2014-11-21 NOTE — Telephone Encounter (Signed)
Rx for Humulin n sent to pt's pharmacy. Patient notified rx has been sent and voiced understanding.

## 2014-11-21 NOTE — Telephone Encounter (Signed)
Patient called stating the Novolin N is not covered under her insurance. Covered Alternative is Humulin N. Ok to change? Thanks!

## 2014-11-21 NOTE — Telephone Encounter (Signed)
Pt called and needs insurance called ([phone]) because it is not covering her insulin , please call pt back [phone]

## 2014-11-21 NOTE — Telephone Encounter (Signed)
ok 

## 2014-11-21 NOTE — Telephone Encounter (Signed)
[place] Patient Name: Christy May Gender: Female DOB: Sep 28, 1950 Age: 64 Y 11 M 26 D Return Phone Number: [phone] (Primary) Address: City/State/Zip: [place] Client [facility] - Client Client Site [facility] Contact Type Call Call Type Triage / Clinical Caller Name Jolena Relationship To Patient Self Appointment Disposition EMR Appointment Scheduled Info pasted into Epic Yes Return Phone Number [phone] (Primary) Chief Complaint ABDOMINAL PAIN - Severe and only in abdomen Initial Comment [name] is having mild facial pain, and shoulder pain. She is being treated for a UTI. She is in so much pain, having trouble sitting. She is at her Diabetic Doctor, they wanted her to call her FP regarding whats going on. Severe abdominal pain. GOTO Facility Not Listed dr Elease Hashimoto at 3:45 PreDisposition Call Doctor Nurse Assessment Nurse: Buck Mam, RN, Trish Date/Time Eilene Ghazi Time): 11/16/2014 1:44:09 PM Confirm and document reason for call. If symptomatic, describe symptoms. ---Patient is [name] is having mild facial pain, and shoulder pain. She is being treated for a UTI. She is in so much pain, having trouble sitting. She is at her Diabetic Doctor, they wanted her to call her FP regarding what's going on. Severe abdominal pain. Is on Keflex and is not helping. Has the patient traveled out of the country within the last 30 days? ---No Does the patient require triage? ---Yes Related visit to physician within the last 2 weeks? ---Yes diabetic dr and was seen at the ER 05/22 Does the PT have any chronic conditions? (i.e. diabetes, asthma, etc.) ---Yes List chronic conditions. ---diabetic hypertension bronchitis. Guidelines Guideline Title Affirmed Question Affirmed Notes Nurse Date/Time Eilene Ghazi Time) Urinary Tract Infection  on Antibiotic Follow-up Call - Female [1] SEVERE pain (e.g., excruciating) AND [2] no improvement 2 hours after pain medications Buck Mam, RN, Wannetta Sender 11/16/2014 1:46:42 PM PLEASE NOTE: All timestamps contained within this report are represented as [place] Standard Time. CONFIDENTIALTY NOTICE: This fax transmission is intended only for the addressee. It contains information that is legally privileged, confidential or otherwise protected from use or disclosure. If you are not the intended recipient, you are strictly prohibited from reviewing, disclosing, copying using or disseminating any of this information or taking any action in reliance on or regarding this information. If you have received this fax in error, please notify us immediately by telephone so that we can arrange for its return to [place]. Phone: [phone], Toll-Free: [phone], Fax: [phone] Page: 2 of 2 Call Id: [id number] [place]. Time Eilene Ghazi Time) Disposition Final User 11/16/2014 1:39:23 PM Send to Urgent Queue Baruch Goldmann 11/16/2014 1:53:18 PM See Physician within 4 Hours (or PCP triage) Yes Buck Mam, RN, Particia Lather Understands: Yes Disagree/Comply: Comply Care Advice Given Per Guideline SEE PHYSICIAN WITHIN 4 HOURS (or PCP triage): CALL BACK IF: * You become worse. CARE ADVICE given per Urinary Tract Infection Follow-Up Call, Female (Adult) guideline. ACETAMINOPHEN (E.G., TYLENOL): * Take 650 mg (two 325 mg pills) by mouth every 4-6 hours as needed. Each Regular Strength Tylenol pill has 325 mg of acetaminophen. The most you should take each day is 3,250 mg (10 Regular Strength pills a day). After Care Instructions Given Call Event Type User Date / Time Description Comments User: Rubie Maid, RN Date/Time (Eastern Time): 11/16/2014 1:59:23 PM 5/12 seen in urgent care and was given medication for pain for shoulder also given abx shot. Then went to ER on the 22 was  put on keflex for uti was given 6-7 pills for pain. Has an  appointment June 10 th With PCP. She state I need to be evaluated today for the pain and the UTI is not going away. Referrals REFERRED TO PCP OFFICE

## 2014-11-30 ENCOUNTER — Ambulatory Visit (INDEPENDENT_AMBULATORY_CARE_PROVIDER_SITE_OTHER): Payer: Medicare Other | Admitting: Internal Medicine

## 2014-11-30 ENCOUNTER — Encounter: Payer: Self-pay | Admitting: Internal Medicine

## 2014-11-30 ENCOUNTER — Ambulatory Visit: Payer: Medicare Other | Admitting: Internal Medicine

## 2014-11-30 VITALS — BP 152/84 | HR 86 | Temp 98.3°F | Resp 18 | Wt 307.0 lb

## 2014-11-30 DIAGNOSIS — E1041 Type 1 diabetes mellitus with diabetic mononeuropathy: Secondary | ICD-10-CM | POA: Diagnosis not present

## 2014-11-30 DIAGNOSIS — M25511 Pain in right shoulder: Secondary | ICD-10-CM

## 2014-11-30 DIAGNOSIS — E1049 Type 1 diabetes mellitus with other diabetic neurological complication: Secondary | ICD-10-CM

## 2014-11-30 DIAGNOSIS — I10 Essential (primary) hypertension: Secondary | ICD-10-CM

## 2014-11-30 DIAGNOSIS — IMO0002 Reserved for concepts with insufficient information to code with codable children: Secondary | ICD-10-CM

## 2014-11-30 DIAGNOSIS — E1065 Type 1 diabetes mellitus with hyperglycemia: Secondary | ICD-10-CM

## 2014-11-30 MED ORDER — HYDROCODONE-ACETAMINOPHEN 5-325 MG PO TABS: 1.0000 | ORAL_TABLET | Freq: Four times a day (QID) | ORAL | Status: DC | PRN

## 2014-11-30 NOTE — Progress Notes (Signed)
Subjective:    Patient ID: Christy May, female    DOB: 1950-11-17, 64 y.o.   MRN: [id number]  HPI  Here to f/u after seen at [facility] with right shoulder pain/myofascial per pt, s/p cortisone, then sling use and pain med/tramadol, then later antibx for UTI at ER may 29. Overall pain persists, missed 3 visits with Dr Smith/sport med, not clear if will accept her in future appt.  Pt denies chest pain, increased sob or doe, wheezing, orthopnea, PND, increased LE swelling, palpitations, dizziness or syncope.  Pt denies new neurological symptoms such as new headache, or facial or extremity weakness or numbness   Pt denies polydipsia, polyuria, BP Readings from Last 3 Encounters:  11/30/14 152/84  11/16/14 120/70  11/16/14 136/88   Past Medical History  Diagnosis Date  . Anemia, unspecified   . Dehydration   . Depressive disorder, not elsewhere classified   . Esophageal reflux   . Unspecified essential hypertension   . Unspecified menopausal and postmenopausal disorder   . Obesity, unspecified   . Inflammatory and toxic neuropathy, unspecified   . Unspecified vitamin D deficiency   . Dysphagia 2007    historyof dysphagia with severe dysmotility by barium swallow-[name]  . Obesity   . Hypertension   . Allergic bronchitis   . Complication of anesthesia     "I've had recall; I probably stopped breathing at least 2 times" (09/03/2014)  . Family history of adverse reaction to anesthesia     "my brother was told they lost him a couple times during OR"  . OSA (obstructive sleep apnea)     "stopped using my CPAP; just couldn't use it cause it kept me awake and I couldn't keep it on my face'"(09/03/2014)  . Heart murmur   . Chronic airway obstruction, not elsewhere classified     "I was told I don't have this/tests done @ [facility] 05/2014"  . Extrinsic asthma, unspecified     "I was told I don't have this/tests done @ [facility] 05/2014"  . Type II diabetes mellitus   . History of hiatal  hernia   . Headache     "weekly to monthly" (09/03/2014)  . Migraine     "couple times/month right now" (09/03/2014)  . Spondylosis of unspecified site without mention of myelopathy   . Arthritis     "neck, back, hands" (09/03/2014)  . Diabetic peripheral neuropathy   . Chronic back pain    Past Surgical History  Procedure Laterality Date  . Refractive surgery Bilateral   . Anterior cervical decomp/discectomy fusion      multiple cervical spine levels;Dr. Arnoldo Morale  . Incision and drainage abscess      Chest  . Dilation and curettage of uterus    . Exploratory laparotomy    . Total knee arthroplasty Left 10/2003  . Total knee arthroplasty Right 01/2004    [name]  . Foot surgery Right X 2    "took blood out of my arms and put platelets in my feet"  . Appendectomy    . Tonsillectomy    . Esophagogastroduodenoscopy N/A 05/08/2014    Procedure: ESOPHAGOGASTRODUODENOSCOPY (EGD);  Surgeon: [name], MD;  Location: [name];  Service: Endoscopy;  Laterality: N/A;  . Colonoscopy N/A 05/08/2014    Procedure: COLONOSCOPY;  Surgeon: [name], MD;  Location: [facility];  Service: Endoscopy;  Laterality: N/A;  . Back surgery    . Cholecystectomy open    . Abdominal hysterectomy      "  partial"  . Eye surgery    . Joint replacement      reports that she quit smoking about 28 years ago. Her smoking use included Cigarettes. She has a 20 pack-year smoking history. She has never used smokeless tobacco. She reports that she drinks alcohol. She reports that she does not use illicit drugs. family history includes Colon polyps in her brother; Diabetes in her sister and another family member; Esophageal cancer in her brother and sister; Heart disease in her maternal grandfather; Pancreatic cancer in her sister. Allergies  Allergen Reactions  . Ace Inhibitors Anaphylaxis  . Penicillins Hives and Rash  . Tape Hives    Paper tape is ok to use  . Adhesive [Tape] Rash   Current  Outpatient Prescriptions on File Prior to Visit  Medication Sig Dispense Refill  . albuterol (PROVENTIL) (2.5 MG/3ML) 0.083% nebulizer solution Take 2.5 mg by nebulization every 6 (six) hours as needed for wheezing or shortness of breath.    Marland Kitchen aspirin 81 MG tablet Take 81 mg by mouth at bedtime.     . cholecalciferol (VITAMIN D) 1000 UNITS tablet Take 2,000 Units by mouth every morning.     . cyclobenzaprine (FLEXERIL) 5 MG tablet TAKE ONE TABLET BY MOUTH THREE TIMES DAILY AS NEEDED FOR MUSCLE SPASMS 90 tablet 0  . diclofenac sodium (VOLTAREN) 1 % GEL Apply 4 g topically 4 (four) times daily as needed (right hand, arm and neck pain.).    Marland Kitchen famotidine (PEPCID) 20 MG tablet TAKE 1 TABLET BY MOUTH EVERY NIGHT AT BEDTIME 30 tablet 5  . fluticasone (FLONASE) 50 MCG/ACT nasal spray Place 2 sprays into both nostrils every morning. 16 g 6  . gabapentin (NEURONTIN) 800 MG tablet TAKE 1 TABLET BY MOUTH THREE TIMES DAILY 270 tablet 1  . HYDROcodone-homatropine (HYCODAN) 5-1.5 MG/5ML syrup Take 5 mLs by mouth every 6 (six) hours as needed for cough. 180 mL 0  . insulin NPH Human (HUMULIN N) 100 UNIT/ML injection Inject 170 units every morning. 60 mL 3  . irbesartan (AVAPRO) 300 MG tablet TAKE 1 TABLET BY MOUTH DAILY 90 tablet 0  . Lancets (ONETOUCH ULTRASOFT) lancets Use to test blood sugars twice a day. 100 each 12  . loratadine (CLARITIN) 10 MG tablet TAKE 1 TABLET BY MOUTH EVERY DAY 30 tablet 2  . metoprolol (LOPRESSOR) 50 MG tablet Take 75 mg by mouth 2 (two) times daily.    . montelukast (SINGULAIR) 10 MG tablet Take 10 mg by mouth at bedtime.    Marland Kitchen nystatin cream (MYCOSTATIN) APPLY TO AFFECTED AREA TWICE DAILY 30 g 0  . omeprazole (PRILOSEC) 40 MG capsule Take 1 capsule (40 mg total) by mouth daily. 30 capsule 5  . ondansetron (ZOFRAN ODT) 4 MG disintegrating tablet Take 1 tablet (4 mg total) by mouth every 8 (eight) hours as needed for nausea or vomiting. 20 tablet 0  . ONE TOUCH ULTRA TEST test strip  USE TO TEST BLOOD SUGARS TWICE DAILY 100 each 2  . oxybutynin (DITROPAN-XL) 10 MG 24 hr tablet TAKE 1 TABLET BY MOUTH DAILY 90 tablet 1  . phenazopyridine (PYRIDIUM) 200 MG tablet Take 1 tablet (200 mg total) by mouth 3 (three) times daily as needed for pain. 6 tablet 0  . promethazine (PHENERGAN) 25 MG tablet TAKE 1 TABLET BY MOUTH EVERY 6 HOURS AS NEEDED FOR NAUSEA OR VOMITING 30 tablet 0   No current facility-administered medications on file prior to visit.     Review of  Systems  Constitutional: Negative for unusual diaphoresis or night sweats HENT: Negative for ringing in ear or discharge Eyes: Negative for double vision or worsening visual disturbance.  Respiratory: Negative for choking and stridor.   Gastrointestinal: Negative for vomiting or other signifcant bowel change Genitourinary: Negative for hematuria or change in urine volume.  Musculoskeletal: Negative for other MSK pain or swelling Skin: Negative for color change and worsening wound.  Neurological: Negative for tremors and numbness other than noted  Psychiatric/Behavioral: Negative for decreased concentration or agitation other than above       Objective:   Physical Exam BP 152/84 mmHg  Pulse 86  Temp(Src) 98.3 F (36.8 C) (Oral)  Resp 18  Wt 307 lb (139.254 kg)  SpO2 97% VS noted,  Constitutional: Pt appears in no significant distress HENT: Head: NCAT.  Right Ear: External ear normal.  Left Ear: External ear normal.  Eyes: . Pupils are equal, round, and reactive to light. Conjunctivae and EOM are normal Neck: Normal range of motion. Neck supple.  Cardiovascular: Normal rate and regular rhythm.   Pulmonary/Chest: Effort normal and breath sounds without rales or wheezing.  Abd:  Soft, NT, ND, + BS Neurological: Pt is alert. Not confused , motor grossly intact Skin: Skin is warm. No rash, no LE edema Psychiatric: Pt behavior is normal. No agitation.  Right shoulder with reduced ROM to abduction, diffuse  tender Wt Readings from Last 3 Encounters:  11/30/14 307 lb (139.254 kg)  11/16/14 301 lb (136.533 kg)  11/16/14 301 lb (136.533 kg)        Assessment & Plan:

## 2014-11-30 NOTE — Assessment & Plan Note (Signed)
Recently worse overall control, for better diet, f/u endo as planned

## 2014-11-30 NOTE — Patient Instructions (Signed)
Please take all new medication as prescribed - the pain medication  Please continue all other medications as before, and refills have been done if requested.  Please have the pharmacy call with any other refills you may need.  Please keep your appointments with your specialists as you may have planned  We will try to refer back to Dr Tamala Julian, but let me know if you would rather see orthopedic

## 2014-11-30 NOTE — Assessment & Plan Note (Signed)
Ok for limited hydrocodone prn, refer Dr Tamala Julian or orthopedic if not able,  to f/u any worsening symptoms or concerns

## 2014-11-30 NOTE — Assessment & Plan Note (Signed)
Mild elev today likely situational, o/w stable overall by history and exam, recent data reviewed with pt, and pt to continue medical treatment as before,  to f/u any worsening symptoms or concerns BP Readings from Last 3 Encounters:  11/30/14 152/84  11/16/14 120/70  11/16/14 136/88

## 2014-11-30 NOTE — Progress Notes (Signed)
Pre visit review using our clinic review tool, if applicable. No additional management support is needed unless otherwise documented below in the visit note. 

## 2014-12-01 ENCOUNTER — Other Ambulatory Visit: Payer: Self-pay | Admitting: Internal Medicine

## 2014-12-01 DIAGNOSIS — Z9181 History of falling: Secondary | ICD-10-CM | POA: Diagnosis not present

## 2014-12-01 DIAGNOSIS — Z794 Long term (current) use of insulin: Secondary | ICD-10-CM | POA: Diagnosis not present

## 2014-12-01 DIAGNOSIS — E1140 Type 2 diabetes mellitus with diabetic neuropathy, unspecified: Secondary | ICD-10-CM | POA: Diagnosis not present

## 2014-12-01 DIAGNOSIS — I10 Essential (primary) hypertension: Secondary | ICD-10-CM | POA: Diagnosis not present

## 2014-12-01 DIAGNOSIS — E669 Obesity, unspecified: Secondary | ICD-10-CM | POA: Diagnosis not present

## 2014-12-01 DIAGNOSIS — G4733 Obstructive sleep apnea (adult) (pediatric): Secondary | ICD-10-CM | POA: Diagnosis not present

## 2014-12-01 DIAGNOSIS — Z96653 Presence of artificial knee joint, bilateral: Secondary | ICD-10-CM | POA: Diagnosis not present

## 2014-12-01 DIAGNOSIS — M25511 Pain in right shoulder: Secondary | ICD-10-CM | POA: Diagnosis not present

## 2014-12-01 DIAGNOSIS — Z87891 Personal history of nicotine dependence: Secondary | ICD-10-CM | POA: Diagnosis not present

## 2014-12-01 DIAGNOSIS — M6281 Muscle weakness (generalized): Secondary | ICD-10-CM | POA: Diagnosis not present

## 2014-12-03 DIAGNOSIS — M6281 Muscle weakness (generalized): Secondary | ICD-10-CM | POA: Diagnosis not present

## 2014-12-03 DIAGNOSIS — M25511 Pain in right shoulder: Secondary | ICD-10-CM | POA: Diagnosis not present

## 2014-12-03 DIAGNOSIS — E1140 Type 2 diabetes mellitus with diabetic neuropathy, unspecified: Secondary | ICD-10-CM | POA: Diagnosis not present

## 2014-12-03 DIAGNOSIS — E669 Obesity, unspecified: Secondary | ICD-10-CM | POA: Diagnosis not present

## 2014-12-03 DIAGNOSIS — G4733 Obstructive sleep apnea (adult) (pediatric): Secondary | ICD-10-CM | POA: Diagnosis not present

## 2014-12-03 DIAGNOSIS — I10 Essential (primary) hypertension: Secondary | ICD-10-CM | POA: Diagnosis not present

## 2014-12-04 ENCOUNTER — Other Ambulatory Visit: Payer: Self-pay | Admitting: Family Medicine

## 2014-12-04 DIAGNOSIS — M25511 Pain in right shoulder: Secondary | ICD-10-CM | POA: Diagnosis not present

## 2014-12-04 DIAGNOSIS — M6281 Muscle weakness (generalized): Secondary | ICD-10-CM | POA: Diagnosis not present

## 2014-12-04 DIAGNOSIS — E669 Obesity, unspecified: Secondary | ICD-10-CM | POA: Diagnosis not present

## 2014-12-04 DIAGNOSIS — E1140 Type 2 diabetes mellitus with diabetic neuropathy, unspecified: Secondary | ICD-10-CM | POA: Diagnosis not present

## 2014-12-04 DIAGNOSIS — I10 Essential (primary) hypertension: Secondary | ICD-10-CM | POA: Diagnosis not present

## 2014-12-04 DIAGNOSIS — G4733 Obstructive sleep apnea (adult) (pediatric): Secondary | ICD-10-CM | POA: Diagnosis not present

## 2014-12-05 ENCOUNTER — Telehealth: Payer: Self-pay | Admitting: Emergency Medicine

## 2014-12-05 DIAGNOSIS — M25511 Pain in right shoulder: Secondary | ICD-10-CM | POA: Diagnosis not present

## 2014-12-05 DIAGNOSIS — G4733 Obstructive sleep apnea (adult) (pediatric): Secondary | ICD-10-CM | POA: Diagnosis not present

## 2014-12-05 DIAGNOSIS — I10 Essential (primary) hypertension: Secondary | ICD-10-CM | POA: Diagnosis not present

## 2014-12-05 DIAGNOSIS — E1140 Type 2 diabetes mellitus with diabetic neuropathy, unspecified: Secondary | ICD-10-CM | POA: Diagnosis not present

## 2014-12-05 DIAGNOSIS — M6281 Muscle weakness (generalized): Secondary | ICD-10-CM | POA: Diagnosis not present

## 2014-12-05 DIAGNOSIS — E669 Obesity, unspecified: Secondary | ICD-10-CM | POA: Diagnosis not present

## 2014-12-05 NOTE — Telephone Encounter (Signed)
Spoke with Jonni Sanger at [facility], states that pt is having new cpap set up today.  States that pt is also on 02 qhs, wants to make sure RB wants 02 bled into cpap or if she is to go without 02 qhs.  Jonni Sanger states that he has already instructed pt to use 02 with cpap unless RB states otherwise.   Also, Jonni Sanger with [facility] states that pt will need an ono about 1 month after starting cpap (today).  RB please advise.  Thanks!

## 2014-12-06 DIAGNOSIS — E669 Obesity, unspecified: Secondary | ICD-10-CM | POA: Diagnosis not present

## 2014-12-06 DIAGNOSIS — I10 Essential (primary) hypertension: Secondary | ICD-10-CM | POA: Diagnosis not present

## 2014-12-06 DIAGNOSIS — M6281 Muscle weakness (generalized): Secondary | ICD-10-CM | POA: Diagnosis not present

## 2014-12-06 DIAGNOSIS — E1140 Type 2 diabetes mellitus with diabetic neuropathy, unspecified: Secondary | ICD-10-CM | POA: Diagnosis not present

## 2014-12-06 DIAGNOSIS — G4733 Obstructive sleep apnea (adult) (pediatric): Secondary | ICD-10-CM | POA: Diagnosis not present

## 2014-12-06 DIAGNOSIS — M25511 Pain in right shoulder: Secondary | ICD-10-CM | POA: Diagnosis not present

## 2014-12-06 NOTE — Telephone Encounter (Signed)
Yes continue the oxygen bled into her CPAP

## 2014-12-06 NOTE — Telephone Encounter (Signed)
Lm for Jonni Sanger with [facility] to make aware.

## 2014-12-07 DIAGNOSIS — M6281 Muscle weakness (generalized): Secondary | ICD-10-CM | POA: Diagnosis not present

## 2014-12-07 DIAGNOSIS — G4733 Obstructive sleep apnea (adult) (pediatric): Secondary | ICD-10-CM | POA: Diagnosis not present

## 2014-12-07 DIAGNOSIS — M25511 Pain in right shoulder: Secondary | ICD-10-CM | POA: Diagnosis not present

## 2014-12-07 DIAGNOSIS — E669 Obesity, unspecified: Secondary | ICD-10-CM | POA: Diagnosis not present

## 2014-12-07 DIAGNOSIS — E1140 Type 2 diabetes mellitus with diabetic neuropathy, unspecified: Secondary | ICD-10-CM | POA: Diagnosis not present

## 2014-12-07 DIAGNOSIS — I10 Essential (primary) hypertension: Secondary | ICD-10-CM | POA: Diagnosis not present

## 2014-12-07 NOTE — Telephone Encounter (Signed)
Spoke with Earma Reading has provided patient with tubing required to bled oxygen into CPAP and explained to patient how to use it.  Jonni Sanger said he would call patient again today to see how she is doing on the oxygen/cpap.  Nothing further needed.

## 2014-12-11 ENCOUNTER — Telehealth: Payer: Self-pay | Admitting: Internal Medicine

## 2014-12-11 DIAGNOSIS — M25511 Pain in right shoulder: Secondary | ICD-10-CM | POA: Diagnosis not present

## 2014-12-11 DIAGNOSIS — M6281 Muscle weakness (generalized): Secondary | ICD-10-CM | POA: Diagnosis not present

## 2014-12-11 DIAGNOSIS — E669 Obesity, unspecified: Secondary | ICD-10-CM | POA: Diagnosis not present

## 2014-12-11 DIAGNOSIS — I10 Essential (primary) hypertension: Secondary | ICD-10-CM | POA: Diagnosis not present

## 2014-12-11 DIAGNOSIS — E1140 Type 2 diabetes mellitus with diabetic neuropathy, unspecified: Secondary | ICD-10-CM | POA: Diagnosis not present

## 2014-12-11 DIAGNOSIS — G4733 Obstructive sleep apnea (adult) (pediatric): Secondary | ICD-10-CM | POA: Diagnosis not present

## 2014-12-11 NOTE — Telephone Encounter (Signed)
Mark from [facility] called and stated that patient pain level is 10 to 9 and her medication is not helping, please advise [phone]

## 2014-12-12 DIAGNOSIS — I10 Essential (primary) hypertension: Secondary | ICD-10-CM | POA: Diagnosis not present

## 2014-12-12 DIAGNOSIS — G4733 Obstructive sleep apnea (adult) (pediatric): Secondary | ICD-10-CM | POA: Diagnosis not present

## 2014-12-12 DIAGNOSIS — M25511 Pain in right shoulder: Secondary | ICD-10-CM | POA: Diagnosis not present

## 2014-12-12 DIAGNOSIS — E669 Obesity, unspecified: Secondary | ICD-10-CM | POA: Diagnosis not present

## 2014-12-12 DIAGNOSIS — E1140 Type 2 diabetes mellitus with diabetic neuropathy, unspecified: Secondary | ICD-10-CM | POA: Diagnosis not present

## 2014-12-12 DIAGNOSIS — M6281 Muscle weakness (generalized): Secondary | ICD-10-CM | POA: Diagnosis not present

## 2014-12-12 MED ORDER — TRAMADOL HCL 50 MG PO TABS: 50.0000 mg | ORAL_TABLET | Freq: Four times a day (QID) | ORAL | Status: DC | PRN

## 2014-12-12 NOTE — Telephone Encounter (Signed)
Done hardcopy to steph 

## 2014-12-12 NOTE — Telephone Encounter (Signed)
Patient need a refill of Tramadol. [phone]

## 2014-12-14 ENCOUNTER — Telehealth: Payer: Self-pay | Admitting: Internal Medicine

## 2014-12-14 DIAGNOSIS — E1140 Type 2 diabetes mellitus with diabetic neuropathy, unspecified: Secondary | ICD-10-CM | POA: Diagnosis not present

## 2014-12-14 DIAGNOSIS — G4733 Obstructive sleep apnea (adult) (pediatric): Secondary | ICD-10-CM | POA: Diagnosis not present

## 2014-12-14 DIAGNOSIS — E669 Obesity, unspecified: Secondary | ICD-10-CM | POA: Diagnosis not present

## 2014-12-14 DIAGNOSIS — M25511 Pain in right shoulder: Secondary | ICD-10-CM | POA: Diagnosis not present

## 2014-12-14 DIAGNOSIS — M6281 Muscle weakness (generalized): Secondary | ICD-10-CM | POA: Diagnosis not present

## 2014-12-14 DIAGNOSIS — I10 Essential (primary) hypertension: Secondary | ICD-10-CM | POA: Diagnosis not present

## 2014-12-14 NOTE — Telephone Encounter (Signed)
Ok for verbal 

## 2014-12-14 NOTE — Telephone Encounter (Signed)
Requesting orders for occupational Therapy.  Will take verbal order.

## 2014-12-14 NOTE — Telephone Encounter (Signed)
Please advise 

## 2014-12-14 NOTE — Telephone Encounter (Signed)
Verbal order left on Beth's voicemail

## 2014-12-17 ENCOUNTER — Other Ambulatory Visit: Payer: Self-pay | Admitting: Internal Medicine

## 2014-12-17 DIAGNOSIS — M6281 Muscle weakness (generalized): Secondary | ICD-10-CM | POA: Diagnosis not present

## 2014-12-17 DIAGNOSIS — M25511 Pain in right shoulder: Secondary | ICD-10-CM | POA: Diagnosis not present

## 2014-12-17 DIAGNOSIS — I10 Essential (primary) hypertension: Secondary | ICD-10-CM | POA: Diagnosis not present

## 2014-12-17 DIAGNOSIS — E669 Obesity, unspecified: Secondary | ICD-10-CM | POA: Diagnosis not present

## 2014-12-17 DIAGNOSIS — E1140 Type 2 diabetes mellitus with diabetic neuropathy, unspecified: Secondary | ICD-10-CM | POA: Diagnosis not present

## 2014-12-17 DIAGNOSIS — G4733 Obstructive sleep apnea (adult) (pediatric): Secondary | ICD-10-CM | POA: Diagnosis not present

## 2014-12-18 DIAGNOSIS — E669 Obesity, unspecified: Secondary | ICD-10-CM | POA: Diagnosis not present

## 2014-12-18 DIAGNOSIS — M6281 Muscle weakness (generalized): Secondary | ICD-10-CM | POA: Diagnosis not present

## 2014-12-18 DIAGNOSIS — G4733 Obstructive sleep apnea (adult) (pediatric): Secondary | ICD-10-CM | POA: Diagnosis not present

## 2014-12-18 DIAGNOSIS — M25511 Pain in right shoulder: Secondary | ICD-10-CM | POA: Diagnosis not present

## 2014-12-18 DIAGNOSIS — I10 Essential (primary) hypertension: Secondary | ICD-10-CM | POA: Diagnosis not present

## 2014-12-18 DIAGNOSIS — E1140 Type 2 diabetes mellitus with diabetic neuropathy, unspecified: Secondary | ICD-10-CM | POA: Diagnosis not present

## 2014-12-19 DIAGNOSIS — I10 Essential (primary) hypertension: Secondary | ICD-10-CM | POA: Diagnosis not present

## 2014-12-19 DIAGNOSIS — E1140 Type 2 diabetes mellitus with diabetic neuropathy, unspecified: Secondary | ICD-10-CM | POA: Diagnosis not present

## 2014-12-19 DIAGNOSIS — M6281 Muscle weakness (generalized): Secondary | ICD-10-CM | POA: Diagnosis not present

## 2014-12-19 DIAGNOSIS — E669 Obesity, unspecified: Secondary | ICD-10-CM | POA: Diagnosis not present

## 2014-12-19 DIAGNOSIS — M25511 Pain in right shoulder: Secondary | ICD-10-CM | POA: Diagnosis not present

## 2014-12-19 DIAGNOSIS — G4733 Obstructive sleep apnea (adult) (pediatric): Secondary | ICD-10-CM | POA: Diagnosis not present

## 2014-12-20 DIAGNOSIS — E669 Obesity, unspecified: Secondary | ICD-10-CM | POA: Diagnosis not present

## 2014-12-20 DIAGNOSIS — G4733 Obstructive sleep apnea (adult) (pediatric): Secondary | ICD-10-CM | POA: Diagnosis not present

## 2014-12-20 DIAGNOSIS — E1140 Type 2 diabetes mellitus with diabetic neuropathy, unspecified: Secondary | ICD-10-CM | POA: Diagnosis not present

## 2014-12-20 DIAGNOSIS — I10 Essential (primary) hypertension: Secondary | ICD-10-CM | POA: Diagnosis not present

## 2014-12-20 DIAGNOSIS — M6281 Muscle weakness (generalized): Secondary | ICD-10-CM | POA: Diagnosis not present

## 2014-12-20 DIAGNOSIS — M25511 Pain in right shoulder: Secondary | ICD-10-CM | POA: Diagnosis not present

## 2014-12-21 DIAGNOSIS — M6281 Muscle weakness (generalized): Secondary | ICD-10-CM | POA: Diagnosis not present

## 2014-12-21 DIAGNOSIS — E669 Obesity, unspecified: Secondary | ICD-10-CM | POA: Diagnosis not present

## 2014-12-21 DIAGNOSIS — M25511 Pain in right shoulder: Secondary | ICD-10-CM | POA: Diagnosis not present

## 2014-12-21 DIAGNOSIS — I10 Essential (primary) hypertension: Secondary | ICD-10-CM | POA: Diagnosis not present

## 2014-12-21 DIAGNOSIS — G4733 Obstructive sleep apnea (adult) (pediatric): Secondary | ICD-10-CM | POA: Diagnosis not present

## 2014-12-21 DIAGNOSIS — E1140 Type 2 diabetes mellitus with diabetic neuropathy, unspecified: Secondary | ICD-10-CM | POA: Diagnosis not present

## 2014-12-26 DIAGNOSIS — M6281 Muscle weakness (generalized): Secondary | ICD-10-CM | POA: Diagnosis not present

## 2014-12-26 DIAGNOSIS — G4733 Obstructive sleep apnea (adult) (pediatric): Secondary | ICD-10-CM | POA: Diagnosis not present

## 2014-12-26 DIAGNOSIS — E669 Obesity, unspecified: Secondary | ICD-10-CM | POA: Diagnosis not present

## 2014-12-26 DIAGNOSIS — M25511 Pain in right shoulder: Secondary | ICD-10-CM | POA: Diagnosis not present

## 2014-12-26 DIAGNOSIS — I10 Essential (primary) hypertension: Secondary | ICD-10-CM | POA: Diagnosis not present

## 2014-12-26 DIAGNOSIS — E1140 Type 2 diabetes mellitus with diabetic neuropathy, unspecified: Secondary | ICD-10-CM | POA: Diagnosis not present

## 2014-12-27 ENCOUNTER — Encounter: Payer: Self-pay | Admitting: Family Medicine

## 2014-12-27 ENCOUNTER — Ambulatory Visit (INDEPENDENT_AMBULATORY_CARE_PROVIDER_SITE_OTHER): Payer: Medicare Other | Admitting: Family Medicine

## 2014-12-27 VITALS — BP 136/74 | HR 82 | Wt 300.0 lb

## 2014-12-27 DIAGNOSIS — M5412 Radiculopathy, cervical region: Secondary | ICD-10-CM | POA: Diagnosis not present

## 2014-12-27 NOTE — Progress Notes (Signed)
Pre visit review using our clinic review tool, if applicable. No additional management support is needed unless otherwise documented below in the visit note. 

## 2014-12-27 NOTE — Patient Instructions (Addendum)
Good to see you.  Ice 20 minutes 2 times daily. Usually after activity and before bed. Stay active.  Try exercises 3 times a week at least We will try a more natural approach.  Tylenol 500mg  3 times daily Vitamin D 4000 IU daily Fish oil 2 grams daily Turmeric 500mg  twice daily Glucosamine 1500mg  daily Turmeric 500mg  twice daily pennsaid pinkie amount topically 2 times daily as needed.  See me again in 4 weeks. We can consider other injections as well.

## 2014-12-27 NOTE — Progress Notes (Signed)
[facility] [place], [place] [address] Phone: [phone] Subjective:    I'm seeing this patient by the request  of:  Cathlean Cower, MD   CC: Neck and back pain  SAY:[id number] Christy May is a 64 y.o. female coming in with complaint of neck and back pain. Patient states that the neck pain seems to have a past medical history significant for a C3-C7 fusion. Patient unfortunately continued to have pain even after the surgery. Patient has been following up with her neurosurgeon and they have discussed the possibility of more surgery. Patient would like to avoid this. Patient states that the pain seems to go into her right shoulder as well. Patient states sometimes she feels like she has limited range of motion. Patient did have x-rays of her right shoulder previously in March of this year that showed only mild degenerative changes. Patient states though it is very difficult to tell if this is coming from her neck or from her shoulder. Patient states that the pain is starting to stop her from multiple activities. States that she cannot be active secondary to the amount of pain. Patient states that the only thing that has seemed to help in the passes hydrocodone and this is barely. Patient is wondering if there is anything else that she can possibly do. States that he can wake her up at night and rates the severity of pain is 8 out of 10. Does not notice any weakness of the hand would state that there is numbness. Patient does have back pain as well as muscle spasms of the lower spine. Patient states that the Voltaren gel as well as the muscle relaxer is moderately beneficial. Denies any radiation into legs or any numbness or tingling.  Past Medical History  Diagnosis Date  . Anemia, unspecified   . Dehydration   . Depressive disorder, not elsewhere classified   . Esophageal reflux   . Unspecified essential hypertension   . Unspecified menopausal and  postmenopausal disorder   . Obesity, unspecified   . Inflammatory and toxic neuropathy, unspecified   . Unspecified vitamin D deficiency   . Dysphagia 2007    historyof dysphagia with severe dysmotility by barium swallow-[name]  . Obesity   . Hypertension   . Allergic bronchitis   . Complication of anesthesia     "I've had recall; I probably stopped breathing at least 2 times" (09/03/2014)  . Family history of adverse reaction to anesthesia     "my brother was told they lost him a couple times during OR"  . OSA (obstructive sleep apnea)     "stopped using my CPAP; just couldn't use it cause it kept me awake and I couldn't keep it on my face'"(09/03/2014)  . Heart murmur   . Chronic airway obstruction, not elsewhere classified     "I was told I don't have this/tests done @ [facility] 05/2014"  . Extrinsic asthma, unspecified     "I was told I don't have this/tests done @ [facility] 05/2014"  . Type II diabetes mellitus   . History of hiatal hernia   . Headache     "weekly to monthly" (09/03/2014)  . Migraine     "couple times/month right now" (09/03/2014)  . Spondylosis of unspecified site without mention of myelopathy   . Arthritis     "neck, back, hands" (09/03/2014)  . Diabetic peripheral neuropathy   . Chronic back pain    Past Surgical History  Procedure Laterality Date  . Refractive surgery Bilateral   . Anterior cervical decomp/discectomy fusion      multiple cervical spine levels;Dr. Arnoldo Morale  . Incision and drainage abscess      Chest  . Dilation and curettage of uterus    . Exploratory laparotomy    . Total knee arthroplasty Left 10/2003  . Total knee arthroplasty Right 01/2004    [name]  . Foot surgery Right X 2    "took blood out of my arms and put platelets in my feet"  . Appendectomy    . Tonsillectomy    . Esophagogastroduodenoscopy N/A 05/08/2014    Procedure: ESOPHAGOGASTRODUODENOSCOPY (EGD);  Surgeon: [name], MD;  Location: [name];  Service:  Endoscopy;  Laterality: N/A;  . Colonoscopy N/A 05/08/2014    Procedure: COLONOSCOPY;  Surgeon: [name], MD;  Location: [facility];  Service: Endoscopy;  Laterality: N/A;  . Back surgery    . Cholecystectomy open    . Abdominal hysterectomy      "partial"  . Eye surgery    . Joint replacement     History  Substance Use Topics  . Smoking status: Former Smoker -- 1.00 packs/day for 20 years    Types: Cigarettes    Quit date: 06/22/1986  . Smokeless tobacco: Never Used  . Alcohol Use: Yes     Comment: "stopped drinking in ~ 1983"   Family History  Problem Relation Age of Onset  . Esophageal cancer Brother   . Esophageal cancer Sister     ?  . Colon polyps Brother   . Pancreatic cancer Sister     ?  . Diabetes Sister   . Diabetes      Aunt and Uncle  . Heart disease Maternal Grandfather    Allergies  Allergen Reactions  . Ace Inhibitors Anaphylaxis  . Penicillins Hives and Rash  . Tape Hives    Paper tape is ok to use  . Adhesive [Tape] Rash        Past medical history, social, surgical and family history all reviewed in electronic medical record.   Review of Systems: No headache, visual changes, nausea, vomiting, diarrhea, constipation, dizziness, abdominal pain, skin rash, fevers, chills, night sweats, weight loss, swollen lymph nodes, body aches, joint swelling, muscle aches, chest pain, shortness of breath, mood changes.   Objective Blood pressure 136/74, pulse 82, weight 300 lb (136.079 kg), SpO2 95 %.  General: No apparent distress alert and oriented x3 mood and affect normal, dressed appropriately. Morbidly obese HEENT: Pupils equal, extraocular movements intact  Respiratory: Patient's speak in full sentences and does not appear short of breath  Cardiovascular: No lower extremity edema, non tender, no erythema  Skin: Warm dry intact with no signs of infection or rash on extremities or on axial skeleton.  Abdomen: Soft nontender  Neuro: Cranial nerves  II through XII are intact, neurovascularly intact in all extremities with 2+ DTRs and 2+ pulses.  Lymph: No lymphadenopathy of posterior or anterior cervical chain or axillae bilaterally.  Gait normal with good balance and coordination.  MSK:  Non tender with full range of motion and good stability and symmetric strength and tone of shoulders, elbows, wrist, hip, knee and ankles bilaterally. Severe osteoarthritic changes of multiple joints Neck: Loss of lordosis secondary to previous surgeries No palpable stepoffs. Positive Spurling the right side and 5 C6 distribution Significant limitation in range of motion lacking any extension as well as side bending bilaterally. Lacking the  last 5 of flexion. Grip strength and sensation normal in bilateral hands Strength good C4 to T1 distribution No sensory change to C4 to T1 Negative Hoffman sign bilaterally Reflexes normal   Impression and Recommendations:     This case required medical decision making of moderate complexity.

## 2014-12-27 NOTE — Assessment & Plan Note (Addendum)
This does have cervical radiculopathy that is causing patient's pain. Patient has a fusion of C3-C7. Patient does continue to have significant symptomatology. Patient does have some mild weakness of the pincer grasp. Patient wants to avoid any type of surgery. Patient states the pain medication is helpful but I declined to fill this today. We were not try more of a natural approach. Please see patient instructions for greater detail. Patient even home exercises and icing protocol. Patient will try topical anti-inflammatory. Patient try these different changes and will come back and see me in 4 weeks. Patient continued to have trouble we may need to consider treating this chronic pain syndrome with things such as Cymbalta or Effexor. We will discuss in greater detail in 4 weeks. ?change gabapentin to lyrica, ? Recheck labs for auto immune and uric acid.

## 2014-12-28 ENCOUNTER — Encounter: Payer: Self-pay | Admitting: Family Medicine

## 2014-12-28 DIAGNOSIS — M25511 Pain in right shoulder: Secondary | ICD-10-CM | POA: Diagnosis not present

## 2014-12-28 DIAGNOSIS — I10 Essential (primary) hypertension: Secondary | ICD-10-CM | POA: Diagnosis not present

## 2014-12-28 DIAGNOSIS — M6281 Muscle weakness (generalized): Secondary | ICD-10-CM | POA: Diagnosis not present

## 2014-12-28 DIAGNOSIS — E669 Obesity, unspecified: Secondary | ICD-10-CM | POA: Diagnosis not present

## 2014-12-28 DIAGNOSIS — G4733 Obstructive sleep apnea (adult) (pediatric): Secondary | ICD-10-CM | POA: Diagnosis not present

## 2014-12-28 DIAGNOSIS — E1140 Type 2 diabetes mellitus with diabetic neuropathy, unspecified: Secondary | ICD-10-CM | POA: Diagnosis not present

## 2014-12-30 ENCOUNTER — Other Ambulatory Visit: Payer: Self-pay | Admitting: Internal Medicine

## 2014-12-31 ENCOUNTER — Telehealth: Payer: Self-pay | Admitting: Endocrinology

## 2014-12-31 DIAGNOSIS — G4733 Obstructive sleep apnea (adult) (pediatric): Secondary | ICD-10-CM | POA: Diagnosis not present

## 2014-12-31 DIAGNOSIS — I10 Essential (primary) hypertension: Secondary | ICD-10-CM | POA: Diagnosis not present

## 2014-12-31 DIAGNOSIS — M6281 Muscle weakness (generalized): Secondary | ICD-10-CM | POA: Diagnosis not present

## 2014-12-31 DIAGNOSIS — E1140 Type 2 diabetes mellitus with diabetic neuropathy, unspecified: Secondary | ICD-10-CM | POA: Diagnosis not present

## 2014-12-31 DIAGNOSIS — M25511 Pain in right shoulder: Secondary | ICD-10-CM | POA: Diagnosis not present

## 2014-12-31 DIAGNOSIS — E669 Obesity, unspecified: Secondary | ICD-10-CM | POA: Diagnosis not present

## 2014-12-31 NOTE — Telephone Encounter (Signed)
Pt understands that she is to now increase her insulin to 180 units daily

## 2014-12-31 NOTE — Telephone Encounter (Signed)
See below

## 2014-12-31 NOTE — Telephone Encounter (Signed)
Ok, please increase to 180 units qam Please call if it keeps going over 200

## 2014-12-31 NOTE — Telephone Encounter (Signed)
Pt called and said her sugar has been over 200 for about two weeks she has only been taking 150 insulin but now has bumped it up to 170 please advise

## 2015-01-02 ENCOUNTER — Other Ambulatory Visit: Payer: Self-pay | Admitting: Internal Medicine

## 2015-01-02 ENCOUNTER — Other Ambulatory Visit: Payer: Self-pay | Admitting: Emergency Medicine

## 2015-01-02 DIAGNOSIS — M25511 Pain in right shoulder: Secondary | ICD-10-CM | POA: Diagnosis not present

## 2015-01-02 DIAGNOSIS — I10 Essential (primary) hypertension: Secondary | ICD-10-CM | POA: Diagnosis not present

## 2015-01-02 DIAGNOSIS — E669 Obesity, unspecified: Secondary | ICD-10-CM | POA: Diagnosis not present

## 2015-01-02 DIAGNOSIS — M6281 Muscle weakness (generalized): Secondary | ICD-10-CM | POA: Diagnosis not present

## 2015-01-02 DIAGNOSIS — E1140 Type 2 diabetes mellitus with diabetic neuropathy, unspecified: Secondary | ICD-10-CM | POA: Diagnosis not present

## 2015-01-02 DIAGNOSIS — G4733 Obstructive sleep apnea (adult) (pediatric): Secondary | ICD-10-CM | POA: Diagnosis not present

## 2015-01-03 ENCOUNTER — Telehealth: Payer: Self-pay | Admitting: Internal Medicine

## 2015-01-03 MED ORDER — ONDANSETRON 4 MG PO TBDP: 4.0000 mg | ORAL_TABLET | Freq: Three times a day (TID) | ORAL | Status: DC | PRN

## 2015-01-03 NOTE — Telephone Encounter (Signed)
Pt called in and said that she is feel nauseas from the all the vitamins, she wanted to know if Dr Tamala Julian could call in   ondansetron (ZOFRAN ODT) 4 MG disintegrating tablet [[id number]]  To help her ?      Best number [phone]

## 2015-01-03 NOTE — Telephone Encounter (Signed)
Refill done.  

## 2015-01-08 ENCOUNTER — Telehealth: Payer: Self-pay

## 2015-01-08 DIAGNOSIS — M25511 Pain in right shoulder: Secondary | ICD-10-CM | POA: Diagnosis not present

## 2015-01-08 DIAGNOSIS — G4733 Obstructive sleep apnea (adult) (pediatric): Secondary | ICD-10-CM | POA: Diagnosis not present

## 2015-01-08 DIAGNOSIS — E669 Obesity, unspecified: Secondary | ICD-10-CM | POA: Diagnosis not present

## 2015-01-08 DIAGNOSIS — I10 Essential (primary) hypertension: Secondary | ICD-10-CM | POA: Diagnosis not present

## 2015-01-08 DIAGNOSIS — E1140 Type 2 diabetes mellitus with diabetic neuropathy, unspecified: Secondary | ICD-10-CM | POA: Diagnosis not present

## 2015-01-08 DIAGNOSIS — M6281 Muscle weakness (generalized): Secondary | ICD-10-CM | POA: Diagnosis not present

## 2015-01-08 NOTE — Telephone Encounter (Signed)
Pa initiated via covermymeds 7/19. KEY: [id number]  Script: Cyclobenzaprine

## 2015-01-09 DIAGNOSIS — M25511 Pain in right shoulder: Secondary | ICD-10-CM | POA: Diagnosis not present

## 2015-01-09 DIAGNOSIS — E1140 Type 2 diabetes mellitus with diabetic neuropathy, unspecified: Secondary | ICD-10-CM | POA: Diagnosis not present

## 2015-01-09 DIAGNOSIS — M6281 Muscle weakness (generalized): Secondary | ICD-10-CM | POA: Diagnosis not present

## 2015-01-09 DIAGNOSIS — E669 Obesity, unspecified: Secondary | ICD-10-CM | POA: Diagnosis not present

## 2015-01-09 DIAGNOSIS — G4733 Obstructive sleep apnea (adult) (pediatric): Secondary | ICD-10-CM | POA: Diagnosis not present

## 2015-01-09 DIAGNOSIS — I10 Essential (primary) hypertension: Secondary | ICD-10-CM | POA: Diagnosis not present

## 2015-01-10 ENCOUNTER — Ambulatory Visit: Payer: Medicare Other | Admitting: Sports Medicine

## 2015-01-10 DIAGNOSIS — E669 Obesity, unspecified: Secondary | ICD-10-CM | POA: Diagnosis not present

## 2015-01-10 DIAGNOSIS — G4733 Obstructive sleep apnea (adult) (pediatric): Secondary | ICD-10-CM | POA: Diagnosis not present

## 2015-01-10 DIAGNOSIS — I10 Essential (primary) hypertension: Secondary | ICD-10-CM | POA: Diagnosis not present

## 2015-01-10 DIAGNOSIS — E1140 Type 2 diabetes mellitus with diabetic neuropathy, unspecified: Secondary | ICD-10-CM | POA: Diagnosis not present

## 2015-01-10 DIAGNOSIS — M6281 Muscle weakness (generalized): Secondary | ICD-10-CM | POA: Diagnosis not present

## 2015-01-10 DIAGNOSIS — M25511 Pain in right shoulder: Secondary | ICD-10-CM | POA: Diagnosis not present

## 2015-01-15 DIAGNOSIS — I10 Essential (primary) hypertension: Secondary | ICD-10-CM | POA: Diagnosis not present

## 2015-01-15 DIAGNOSIS — M25511 Pain in right shoulder: Secondary | ICD-10-CM | POA: Diagnosis not present

## 2015-01-15 DIAGNOSIS — E669 Obesity, unspecified: Secondary | ICD-10-CM | POA: Diagnosis not present

## 2015-01-15 DIAGNOSIS — M6281 Muscle weakness (generalized): Secondary | ICD-10-CM | POA: Diagnosis not present

## 2015-01-15 DIAGNOSIS — E1140 Type 2 diabetes mellitus with diabetic neuropathy, unspecified: Secondary | ICD-10-CM | POA: Diagnosis not present

## 2015-01-15 DIAGNOSIS — G4733 Obstructive sleep apnea (adult) (pediatric): Secondary | ICD-10-CM | POA: Diagnosis not present

## 2015-01-16 DIAGNOSIS — E669 Obesity, unspecified: Secondary | ICD-10-CM | POA: Diagnosis not present

## 2015-01-16 DIAGNOSIS — E1140 Type 2 diabetes mellitus with diabetic neuropathy, unspecified: Secondary | ICD-10-CM | POA: Diagnosis not present

## 2015-01-16 DIAGNOSIS — G4733 Obstructive sleep apnea (adult) (pediatric): Secondary | ICD-10-CM | POA: Diagnosis not present

## 2015-01-16 DIAGNOSIS — M6281 Muscle weakness (generalized): Secondary | ICD-10-CM | POA: Diagnosis not present

## 2015-01-16 DIAGNOSIS — M25511 Pain in right shoulder: Secondary | ICD-10-CM | POA: Diagnosis not present

## 2015-01-16 DIAGNOSIS — I10 Essential (primary) hypertension: Secondary | ICD-10-CM | POA: Diagnosis not present

## 2015-01-18 ENCOUNTER — Telehealth: Payer: Self-pay

## 2015-01-18 ENCOUNTER — Telehealth: Payer: Self-pay | Admitting: Endocrinology

## 2015-01-18 DIAGNOSIS — M25511 Pain in right shoulder: Secondary | ICD-10-CM | POA: Diagnosis not present

## 2015-01-18 DIAGNOSIS — E669 Obesity, unspecified: Secondary | ICD-10-CM | POA: Diagnosis not present

## 2015-01-18 DIAGNOSIS — G4733 Obstructive sleep apnea (adult) (pediatric): Secondary | ICD-10-CM | POA: Diagnosis not present

## 2015-01-18 DIAGNOSIS — E1140 Type 2 diabetes mellitus with diabetic neuropathy, unspecified: Secondary | ICD-10-CM | POA: Diagnosis not present

## 2015-01-18 DIAGNOSIS — M6281 Muscle weakness (generalized): Secondary | ICD-10-CM | POA: Diagnosis not present

## 2015-01-18 DIAGNOSIS — I10 Essential (primary) hypertension: Secondary | ICD-10-CM | POA: Diagnosis not present

## 2015-01-18 NOTE — Telephone Encounter (Signed)
We do not have pt updated ins calling pt to find out

## 2015-01-18 NOTE — Telephone Encounter (Signed)
Called to check on status. Ins claims to never received PA info. D/l form and faxed over (expedited) awaiting response

## 2015-01-18 NOTE — Telephone Encounter (Signed)
Please verify current insulin is 180 units qam.  Then increase to 200 qam.

## 2015-01-18 NOTE — Telephone Encounter (Signed)
Please advise below.Called pt to get exact blood sugar readings and pt stated that she could not read them to give me the blood sugar readings.

## 2015-01-18 NOTE — Telephone Encounter (Signed)
Patient stated that her b/s is over 200 hundred and the neuropathy  in her hand has kept her up for two nights. Please advise

## 2015-01-21 DIAGNOSIS — I10 Essential (primary) hypertension: Secondary | ICD-10-CM | POA: Diagnosis not present

## 2015-01-21 DIAGNOSIS — M6281 Muscle weakness (generalized): Secondary | ICD-10-CM | POA: Diagnosis not present

## 2015-01-21 DIAGNOSIS — E1140 Type 2 diabetes mellitus with diabetic neuropathy, unspecified: Secondary | ICD-10-CM | POA: Diagnosis not present

## 2015-01-21 DIAGNOSIS — E669 Obesity, unspecified: Secondary | ICD-10-CM | POA: Diagnosis not present

## 2015-01-21 DIAGNOSIS — M25511 Pain in right shoulder: Secondary | ICD-10-CM | POA: Diagnosis not present

## 2015-01-21 DIAGNOSIS — G4733 Obstructive sleep apnea (adult) (pediatric): Secondary | ICD-10-CM | POA: Diagnosis not present

## 2015-01-21 NOTE — Telephone Encounter (Signed)
The pt was only taking 170units qam.She is now oing to start 180 units qam.Her blood sugars over the weekend were as follows: 01/19/2015 12:04pm 179 01/20/2015 9:20am 161 01/20/2015 6:02pm 79 01/21/2015 10:56am 263

## 2015-01-21 NOTE — Telephone Encounter (Signed)
Called pt to get confirm ins information. Pt stated she was having pain in her hand that was causing extreme discomfort. Wanted to know the status of her Cyclo, I told her that I was working on getting it approved.

## 2015-01-21 NOTE — Telephone Encounter (Signed)
Ok, plese call us later this week, to report cbg's.

## 2015-01-23 DIAGNOSIS — M6281 Muscle weakness (generalized): Secondary | ICD-10-CM | POA: Diagnosis not present

## 2015-01-23 DIAGNOSIS — G4733 Obstructive sleep apnea (adult) (pediatric): Secondary | ICD-10-CM | POA: Diagnosis not present

## 2015-01-23 DIAGNOSIS — E669 Obesity, unspecified: Secondary | ICD-10-CM | POA: Diagnosis not present

## 2015-01-23 DIAGNOSIS — M25511 Pain in right shoulder: Secondary | ICD-10-CM | POA: Diagnosis not present

## 2015-01-23 DIAGNOSIS — I10 Essential (primary) hypertension: Secondary | ICD-10-CM | POA: Diagnosis not present

## 2015-01-23 DIAGNOSIS — E1140 Type 2 diabetes mellitus with diabetic neuropathy, unspecified: Secondary | ICD-10-CM | POA: Diagnosis not present

## 2015-01-24 ENCOUNTER — Ambulatory Visit (INDEPENDENT_AMBULATORY_CARE_PROVIDER_SITE_OTHER): Payer: Medicare Other | Admitting: Family Medicine

## 2015-01-24 ENCOUNTER — Telehealth: Payer: Self-pay | Admitting: Internal Medicine

## 2015-01-24 ENCOUNTER — Encounter: Payer: Self-pay | Admitting: Family Medicine

## 2015-01-24 VITALS — BP 128/80 | HR 82 | Ht 64.5 in | Wt 305.0 lb

## 2015-01-24 DIAGNOSIS — M5412 Radiculopathy, cervical region: Secondary | ICD-10-CM | POA: Diagnosis not present

## 2015-01-24 NOTE — Telephone Encounter (Signed)
Spoke with ins regarding PA. They needed additional info which I provided. They should be sending a fax with decision shortly

## 2015-01-24 NOTE — Assessment & Plan Note (Signed)
Patient is doing somewhat better. I do believe though that patient's body habitus as well as patient's underlying osteophytic changes are going to cause flares from time to time. Encourage her to continue home exercises on a regular basis as well as the vitamin supplementations. Discussed some postural changes of the ergonomic changes it could be beneficial. Patient will though come back and see me again in 6-8 weeks for further evaluation and treatment.

## 2015-01-24 NOTE — Progress Notes (Signed)
[facility] [place], [place] [address] Phone: [phone] Subjective:     CC: Neck and back pain follow up  NAT:[id number] Christy May is a 64 y.o. female coming in with complaint of neck and back pain. Patient states that the neck pain seems to have a past medical history significant for a C3-C7 fusion. Patient unfortunately continued to have pain even after the surgery. Patient has been following up with her neurosurgeon and they have discussed the possibility of more surgery. Patient would like to avoid this.   We saw patient previously and discussed some more exercises focusing on the scapular and the upper back as well as some natural medications a could be beneficial. Patient being treated more for the underlying arthritis. Patient states she is doing significantly better. States that the radicular symptoms seems to be improving but the peripheral neuropathy from her diabetes is more difficult. Patient is also having difficulty controlling her blood sugars at this time. Patient is following up with her primary care provider. Patient states though that the neck pain in the discomfort in the right shoulder seems to be better. Still gives her some difficulty when she does repetitive motion.  Past Medical History  Diagnosis Date  . Anemia, unspecified   . Dehydration   . Depressive disorder, not elsewhere classified   . Esophageal reflux   . Unspecified essential hypertension   . Unspecified menopausal and postmenopausal disorder   . Obesity, unspecified   . Inflammatory and toxic neuropathy, unspecified   . Unspecified vitamin D deficiency   . Dysphagia 2007    historyof dysphagia with severe dysmotility by barium swallow-[name]  . Obesity   . Hypertension   . Allergic bronchitis   . Complication of anesthesia     "I've had recall; I probably stopped breathing at least 2 times" (09/03/2014)  . Family history of adverse reaction  to anesthesia     "my brother was told they lost him a couple times during OR"  . OSA (obstructive sleep apnea)     "stopped using my CPAP; just couldn't use it cause it kept me awake and I couldn't keep it on my face'"(09/03/2014)  . Heart murmur   . Chronic airway obstruction, not elsewhere classified     "I was told I don't have this/tests done @ [facility] 05/2014"  . Extrinsic asthma, unspecified     "I was told I don't have this/tests done @ [facility] 05/2014"  . Type II diabetes mellitus   . History of hiatal hernia   . Headache     "weekly to monthly" (09/03/2014)  . Migraine     "couple times/month right now" (09/03/2014)  . Spondylosis of unspecified site without mention of myelopathy   . Arthritis     "neck, back, hands" (09/03/2014)  . Diabetic peripheral neuropathy   . Chronic back pain    Past Surgical History  Procedure Laterality Date  . Refractive surgery Bilateral   . Anterior cervical decomp/discectomy fusion      multiple cervical spine levels;Dr. Arnoldo Morale  . Incision and drainage abscess      Chest  . Dilation and curettage of uterus    . Exploratory laparotomy    . Total knee arthroplasty Left 10/2003  . Total knee arthroplasty Right 01/2004    [name]  . Foot surgery Right X 2    "took blood out of my arms and put platelets in my feet"  .  Appendectomy    . Tonsillectomy    . Esophagogastroduodenoscopy N/A 05/08/2014    Procedure: ESOPHAGOGASTRODUODENOSCOPY (EGD);  Surgeon: [name], MD;  Location: [name];  Service: Endoscopy;  Laterality: N/A;  . Colonoscopy N/A 05/08/2014    Procedure: COLONOSCOPY;  Surgeon: [name], MD;  Location: [facility];  Service: Endoscopy;  Laterality: N/A;  . Back surgery    . Cholecystectomy open    . Abdominal hysterectomy      "partial"  . Eye surgery    . Joint replacement     History  Substance Use Topics  . Smoking status: Former Smoker -- 1.00 packs/day for 20 years    Types: Cigarettes    Quit date:  06/22/1986  . Smokeless tobacco: Never Used  . Alcohol Use: Yes     Comment: "stopped drinking in ~ 1983"   Family History  Problem Relation Age of Onset  . Esophageal cancer Brother   . Esophageal cancer Sister     ?  . Colon polyps Brother   . Pancreatic cancer Sister     ?  . Diabetes Sister   . Diabetes      Aunt and Uncle  . Heart disease Maternal Grandfather    Allergies  Allergen Reactions  . Ace Inhibitors Anaphylaxis  . Penicillins Hives and Rash  . Tape Hives    Paper tape is ok to use  . Adhesive [Tape] Rash        Past medical history, social, surgical and family history all reviewed in electronic medical record.   Review of Systems: No headache, visual changes, nausea, vomiting, diarrhea, constipation, dizziness, abdominal pain, skin rash, fevers, chills, night sweats, weight loss, swollen lymph nodes, body aches, joint swelling, muscle aches, chest pain, shortness of breath, mood changes.   Objective Blood pressure 128/80, pulse 82, height 5' 4.5" (1.638 m), weight 305 lb (138.347 kg), SpO2 99 %.  General: No apparent distress alert and oriented x3 mood and affect normal, dressed appropriately. Morbidly obese HEENT: Pupils equal, extraocular movements intact  Respiratory: Patient's speak in full sentences and does not appear short of breath  Cardiovascular: No lower extremity edema, non tender, no erythema  Skin: Warm dry intact with no signs of infection or rash on extremities or on axial skeleton.  Abdomen: Soft nontender  Neuro: Cranial nerves II through XII are intact, neurovascularly intact in all extremities with 2+ DTRs and 2+ pulses.  Lymph: No lymphadenopathy of posterior or anterior cervical chain or axillae bilaterally.  Gait normal with good balance and coordination.  MSK:  Non tender with full range of motion and good stability and symmetric strength and tone of shoulders, elbows, wrist, hip, knee and ankles bilaterally. Severe osteoarthritic  changes of multiple joints Neck: Loss of lordosis secondary to previous surgeries No palpable stepoffs. Positive Spurling the right side and 5 C6 distribution Significant limitation in range of motion lacking any extension as well as side bending bilaterally. Lacking the last 5 of flexion. Grip strength and sensation normal in bilateral hands Strength good C4 to T1 distribution No sensory change to C4 to T1 Negative Hoffman sign bilaterally Reflexes normal Mild impingement signs of the shoulder   Impression and Recommendations:     This case required medical decision making of moderate complexity.

## 2015-01-24 NOTE — Patient Instructions (Signed)
Good to see you Please give your self a pat on the back! Ice is your friend Continue the vitamins and the exercises Stay active and keep the sugars under control See me again in 6-8 weeks.

## 2015-01-24 NOTE — Telephone Encounter (Signed)
This has been started. Will you contact pt and let her know it was sent to her plan for approval today and that it can take 72 hours or more before there is a determination.

## 2015-01-24 NOTE — Telephone Encounter (Signed)
Patient called regarding pharmacy needs prior approval for cyclobenzaprine (FLEXERIL) 5 MG tablet [[id number]]  Pharmacy is [facility] on [address]. Patient stated pharmacy has been trying to get this for a while

## 2015-01-24 NOTE — Telephone Encounter (Signed)
Patient informed. 

## 2015-01-24 NOTE — Progress Notes (Signed)
Pre visit review using our clinic review tool, if applicable. No additional management support is needed unless otherwise documented below in the visit note. 

## 2015-01-25 ENCOUNTER — Emergency Department (HOSPITAL_COMMUNITY)
Admission: EM | Admit: 2015-01-25 | Discharge: 2015-01-25 | Disposition: A | Discharge status: Home / Self Care | Payer: Medicare Other

## 2015-01-25 ENCOUNTER — Ambulatory Visit (INDEPENDENT_AMBULATORY_CARE_PROVIDER_SITE_OTHER): Payer: Medicare Other | Admitting: Family Medicine

## 2015-01-25 ENCOUNTER — Telehealth: Payer: Self-pay | Admitting: Internal Medicine

## 2015-01-25 ENCOUNTER — Emergency Department (INDEPENDENT_AMBULATORY_CARE_PROVIDER_SITE_OTHER): Payer: Medicare Other

## 2015-01-25 VITALS — BP 124/72 | HR 79 | Temp 98.0°F | Resp 18 | Ht 64.0 in | Wt 305.0 lb

## 2015-01-25 DIAGNOSIS — M79642 Pain in left hand: Secondary | ICD-10-CM

## 2015-01-25 DIAGNOSIS — M79641 Pain in right hand: Secondary | ICD-10-CM

## 2015-01-25 MED ORDER — HYDROCODONE-ACETAMINOPHEN 5-325 MG PO TABS: 1.0000 | ORAL_TABLET | Freq: Three times a day (TID) | ORAL | Status: DC | PRN

## 2015-01-25 NOTE — Progress Notes (Signed)
[facility] [address] [place] [address] [phone]  Date:  01/25/2015   Name:  Christy May   DOB:  1951-02-26   MRN:  [id number]  PCP:  Cathlean Cower, MD    Chief Complaint: Hand Pain   History of Present Illness:  Christy May is a 64 y.o. very pleasant female patient who presents with the following:  Here today as a new patient- she has a complex PHMx.  It looks like her PCP Dr. Jenny Reichmann is treating her with tramadol and sometimes hydrocodone.  Last rx tramadol 7/10, last hydrocodone 6/10 She called her PCP office this afternoon with the following message Caller states that she thinks she is in a diabetes crisis. Her diabetic neuropathy has been worse for a few days. Her blood sugar was 79 this morning. Current reading is 136. She rates her pain in her hands as 15 on 0-10 scale. She took Gabapentin and Tramadol which does not help with her pain. She was directed to follow-up with UC so came to see [place] tonight  She states that she has had "DM neuropathy" in her hands. She has had this current flare-up for 5-6 days.  She states that she has had neuropathy for about 20 years, but it is usually in her legs, not in her hands.  However she has had some hand pain in the past.  She saw Charlann Boxer at his sports med clinic on 8/4- it appears that she is being seen for neck pain with some questions of neuropathy in her arms.  She has had a cervical spine fusion several years ago. She has not been in touch with her neurosurgeon in a little while.    She notes the pain in both hands, it affects all of her fingers.   She "has been tested for nerve damage" in her hands per Dr. Caralyn Guile- apparently this did show nerve damage but no surgery was indicated at the time  She reports that her glucose was 79 this am.  She does see Dr. Loanne Drilling for her IDDM  Lab Results  Component Value Date   HGBA1C 10.8* 11/16/2014   She is on gabapentin 800 TID Patient Active Problem  List   Diagnosis Date Noted  . Pain in joint, shoulder region 09/11/2014  . Weakness generalized 09/02/2014  . Acute esophagitis 05/08/2014  . Benign neoplasm of sigmoid colon 05/08/2014  . Change in bowel habits 04/17/2014  . Fecal incontinence 04/17/2014  . Urge incontinence 04/08/2014  . Cough 12/06/2013  . Chronic pain syndrome 08/24/2013  . Orthostasis 07/18/2013  . Acute kidney injury 07/18/2013  . Bilateral carpal tunnel syndrome 02/14/2013  . Gout 01/04/2013  . Failure to thrive 06/28/2012  . Gait disorder 06/28/2012  . Type 1 diabetes mellitus with neurological manifestations, uncontrolled 08/30/2011  . Preventative health care 08/23/2011  . Cervical radiculopathy 02/11/2011  . Low back pain 02/11/2011  . VITAMIN D DEFICIENCY 11/22/2009  . ANEMIA-NOS 11/22/2009  . DEGENERATIVE JOINT DISEASE, CERVICAL SPINE 11/22/2009  . Depression 09/16/2009  . POLYNEUROPATHY 09/16/2009  . Morbid obesity 09/09/2009  . Essential hypertension 09/09/2009  . GERD 09/09/2009  . Sleep apnea 09/09/2009    Past Medical History  Diagnosis Date  . Anemia, unspecified   . Dehydration   . Depressive disorder, not elsewhere classified   . Esophageal reflux   . Unspecified essential hypertension   . Unspecified menopausal and postmenopausal disorder   . Obesity, unspecified   .  Inflammatory and toxic neuropathy, unspecified   . Unspecified vitamin D deficiency   . Dysphagia 2007    historyof dysphagia with severe dysmotility by barium swallow-[name]  . Obesity   . Hypertension   . Allergic bronchitis   . Complication of anesthesia     "I've had recall; I probably stopped breathing at least 2 times" (09/03/2014)  . Family history of adverse reaction to anesthesia     "my brother was told they lost him a couple times during OR"  . OSA (obstructive sleep apnea)     "stopped using my CPAP; just couldn't use it cause it kept me awake and I couldn't keep it on my face'"(09/03/2014)  .  Heart murmur   . Chronic airway obstruction, not elsewhere classified     "I was told I don't have this/tests done @ [facility] 05/2014"  . Extrinsic asthma, unspecified     "I was told I don't have this/tests done @ [facility] 05/2014"  . Type II diabetes mellitus   . History of hiatal hernia   . Headache     "weekly to monthly" (09/03/2014)  . Migraine     "couple times/month right now" (09/03/2014)  . Spondylosis of unspecified site without mention of myelopathy   . Arthritis     "neck, back, hands" (09/03/2014)  . Diabetic peripheral neuropathy   . Chronic back pain   . Allergy   . Anxiety     Past Surgical History  Procedure Laterality Date  . Refractive surgery Bilateral   . Anterior cervical decomp/discectomy fusion      multiple cervical spine levels;Dr. Arnoldo Morale  . Incision and drainage abscess      Chest  . Dilation and curettage of uterus    . Exploratory laparotomy    . Total knee arthroplasty Left 10/2003  . Total knee arthroplasty Right 01/2004    [name]  . Foot surgery Right X 2    "took blood out of my arms and put platelets in my feet"  . Appendectomy    . Tonsillectomy    . Esophagogastroduodenoscopy N/A 05/08/2014    Procedure: ESOPHAGOGASTRODUODENOSCOPY (EGD);  Surgeon: [name], MD;  Location: [name];  Service: Endoscopy;  Laterality: N/A;  . Colonoscopy N/A 05/08/2014    Procedure: COLONOSCOPY;  Surgeon: [name], MD;  Location: [facility];  Service: Endoscopy;  Laterality: N/A;  . Back surgery    . Cholecystectomy open    . Abdominal hysterectomy      "partial"  . Eye surgery    . Joint replacement      History  Substance Use Topics  . Smoking status: Former Smoker -- 1.00 packs/day for 20 years    Types: Cigarettes    Quit date: 06/22/1986  . Smokeless tobacco: Never Used  . Alcohol Use: Yes     Comment: "stopped drinking in ~ 1983"    Family History  Problem Relation Age of Onset  . Esophageal cancer Brother   . Esophageal  cancer Sister     ?  . Colon polyps Brother   . Pancreatic cancer Sister     ?  . Diabetes Sister   . Diabetes      Aunt and Uncle  . Heart disease Maternal Grandfather     Allergies  Allergen Reactions  . Ace Inhibitors Anaphylaxis  . Penicillins Hives and Rash  . Tape Hives    Paper tape is ok to use  . Adhesive [Tape] Rash  Medication list has been reviewed and updated.  Current Outpatient Prescriptions on File Prior to Visit  Medication Sig Dispense Refill  . albuterol (PROVENTIL) (2.5 MG/3ML) 0.083% nebulizer solution Take 2.5 mg by nebulization every 6 (six) hours as needed for wheezing or shortness of breath.    Marland Kitchen aspirin 81 MG tablet Take 81 mg by mouth at bedtime.     . cholecalciferol (VITAMIN D) 1000 UNITS tablet Take 2,000 Units by mouth every morning.     . diclofenac sodium (VOLTAREN) 1 % GEL Apply 4 g topically 4 (four) times daily as needed (right hand, arm and neck pain.).    Marland Kitchen famotidine (PEPCID) 20 MG tablet TAKE 1 TABLET BY MOUTH EVERY NIGHT AT BEDTIME 30 tablet 5  . fluticasone (FLONASE) 50 MCG/ACT nasal spray Place 2 sprays into both nostrils every morning. 16 g 6  . gabapentin (NEURONTIN) 800 MG tablet TAKE 1 TABLET BY MOUTH THREE TIMES DAILY 270 tablet 1  . insulin NPH Human (HUMULIN N) 100 UNIT/ML injection Inject 170 units every morning. 60 mL 3  . irbesartan (AVAPRO) 300 MG tablet TAKE 1 TABLET BY MOUTH DAILY 90 tablet 0  . Lancets (ONETOUCH ULTRASOFT) lancets Use to test blood sugars twice a day. 100 each 12  . loratadine (CLARITIN) 10 MG tablet TAKE 1 TABLET BY MOUTH EVERY DAY 30 tablet 5  . metoprolol (LOPRESSOR) 50 MG tablet Take 75 mg by mouth 2 (two) times daily.    . metoprolol (LOPRESSOR) 50 MG tablet Take 1.5 tablets (75 mg total) by mouth 2 (two) times daily. 270 tablet 3  . montelukast (SINGULAIR) 10 MG tablet Take 10 mg by mouth at bedtime.    Marland Kitchen nystatin cream (MYCOSTATIN) APPLY TO AFFECTED AREA TWICE DAILY 30 g 0  . omeprazole  (PRILOSEC) 40 MG capsule Take 1 capsule (40 mg total) by mouth daily. 30 capsule 5  . ondansetron (ZOFRAN ODT) 4 MG disintegrating tablet Take 1 tablet (4 mg total) by mouth every 8 (eight) hours as needed for nausea or vomiting. 20 tablet 0  . ONE TOUCH ULTRA TEST test strip USE TO TEST BLOOD SUGARS TWICE DAILY 100 each 2  . oxybutynin (DITROPAN-XL) 10 MG 24 hr tablet TAKE 1 TABLET BY MOUTH DAILY 90 tablet 1  . promethazine (PHENERGAN) 25 MG tablet TAKE 1 TABLET(25 MG) BY MOUTH EVERY 6 HOURS AS NEEDED FOR NAUSEA OR VOMITING 30 tablet 0  . traMADol (ULTRAM) 50 MG tablet Take 1 tablet (50 mg total) by mouth every 6 (six) hours as needed. 60 tablet 2  . cyclobenzaprine (FLEXERIL) 5 MG tablet TAKE ONE TABLET BY MOUTH THREE TIMES DAILY AS NEEDED FOR MUSCLE SPASMS (Patient not taking: Reported on 01/25/2015) 90 tablet 0  . cyclobenzaprine (FLEXERIL) 5 MG tablet TAKE 1 TABLET(5 MG) BY MOUTH THREE TIMES DAILY AS NEEDED FOR MUSCLE SPASMS (Patient not taking: Reported on 01/25/2015) 90 tablet 0  . HYDROcodone-homatropine (HYCODAN) 5-1.5 MG/5ML syrup Take 5 mLs by mouth every 6 (six) hours as needed for cough. (Patient not taking: Reported on 01/25/2015) 180 mL 0  . phenazopyridine (PYRIDIUM) 200 MG tablet Take 1 tablet (200 mg total) by mouth 3 (three) times daily as needed for pain. (Patient not taking: Reported on 01/25/2015) 6 tablet 0  . promethazine-dextromethorphan (PROMETHAZINE-DM) 6.25-15 MG/5ML syrup Take 1.3 mLs by mouth 4 (four) times daily as needed for cough. (Patient not taking: Reported on 01/25/2015) 240 mL 0   No current facility-administered medications on file prior to visit.  Review of Systems:  As per HPI- otherwise negative.   Physical Examination: Filed Vitals:   01/25/15 1651  BP: 124/72  Pulse: 79  Temp: 98 F (36.7 C)  Resp: 18   Filed Vitals:   01/25/15 1651  Height: 5\' 4"  (1.626 m)  Weight: 305 lb (138.347 kg)   Body mass index is 52.33 kg/(m^2). Ideal Body Weight:  Weight in (lb) to have BMI = 25: 145.3  GEN: WDWN, NAD, Non-toxic, A & O x 3, very obese HEENT: Atraumatic, Normocephalic. Neck supple. No masses, No LAD. Ears and Nose: No external deformity. CV: RRR, No M/G/R. No JVD. No thrill. No extra heart sounds. PULM: CTA B, no wheezes, crackles, rhonchi. No retractions. No resp. distress. No accessory muscle use. EXTR: No c/c/e NEURO Normal gait.  PSYCH: Normally interactive. Conversant. Not depressed or anxious appearing.  Calm demeanor.  Both hands appear normal, no joint nodules or swelling, no redness, heat, or wound.  She endorses vague discomfort with manipulation of her hand joints, no specific joints are problematic.  She is wearing quite a few rings and bracelets bilaterally. Normal ROM of hands and wrists   [facility] reading (PRIMARY) by  Dr. Lorelei Pont. Left hand: negative one view Right hand: negative one view  No fracture or dislocation.  There is mild asymmetric joint space narrowing with small marginal osteophytes involving multiple interphalangeal joints of both hands. There is joint space narrowing and subchondral sclerosis with marginal osteophytes involving the first carpal metacarpal articulations, right greater than left. Small subchondral cysts are seen at the base of the second metacarpals bilaterally. There are no convincing erosions.  Soft tissues are unremarkable.  IMPRESSION: 1. No fracture or acute finding. 2. Arthropathic changes detailed above are most suggestive of osteoarthritis. This most prominently involves the first carpometacarpal articulation of the right hand.  Assessment and Plan: Bilateral hand pain - Plan: DG HANDS 1 VIEW BILAT BALLCATCHERS, HYDROcodone-acetaminophen (NORCO/VICODIN) 5-325 MG per tablet, CANCELED: DG Hand Complete Right, CANCELED: DG Hand Complete Left  Hand pain- this may be due to neuropathy or some other cause. Advised her that I cannot pinpoint a definite cause for her here today.   She states that her pain is uncontrolled with tramadol- did give her a limited supply of hydrocodone to use as needed until she sees her PCP. Also advised her that the best thing she can do for neuropathy is to control her glucose- she will see Dr. Loanne Drilling soon for follow-up  Signed Lamar Blinks, MD

## 2015-01-25 NOTE — ED Notes (Signed)
Called pt x 3 with no answer in lobby

## 2015-01-25 NOTE — ED Notes (Signed)
Pt did not answer x 1 

## 2015-01-25 NOTE — Patient Instructions (Signed)
Try the hydrocodone for your hand pain. Remember this can make you feel sleepy.  If this is indeed diabetic neuropathy, the best thing to do is to keep your sugar under control Please do be sure to follow-up with Dr. Loanne Drilling soon- give his office a call on Monday

## 2015-01-25 NOTE — Telephone Encounter (Signed)
Patient Name: Christy May  DOB: Feb 18, 1951    Initial Comment Caller says she has diabetic neuropathy on her hands and her pain is really bad. The pain wakes her up    Nurse Assessment  Nurse: Mallie Mussel, RN, Alveta Heimlich Date/Time Eilene Ghazi Time): 01/25/2015 1:56:35 PM  Confirm and document reason for call. If symptomatic, describe symptoms. ---Caller states that she thinks she is in a diabetes crisis. Her diabetic neuropathy has been worse for a few days. Her blood sugar was 79 this morning. Current reading is 136. She rates her pain in her hands as 15 on 0-10 scale. She took Gabapentin and Tramadol which does not help with her pain.  Has the patient traveled out of the country within the last 30 days? ---No  Does the patient require triage? ---Yes  Related visit to physician within the last 2 weeks? ---No  Does the PT have any chronic conditions? (i.e. diabetes, asthma, etc.) ---Yes  List chronic conditions. ---Diabetes, HTN,     Guidelines    Guideline Title Affirmed Question Affirmed Notes  Hand and Wrist Pain [1] SEVERE pain (e.g., excruciating, unable to use hand at all) AND [2] not improved after 2 hours of pain medicine    Final Disposition User   See Physician within 4 Hours (or PCP triage) Mallie Mussel, RN, Alveta Heimlich    Comments  No appointments available at [facility] or [place]. She will be either going to the UC by the hospital on [facility]. or the one on [place]. Not sure which at this time.   Referrals  GO TO FACILITY UNDECIDED   Disagree/Comply: Comply

## 2015-01-29 ENCOUNTER — Telehealth: Payer: Self-pay | Admitting: *Deleted

## 2015-01-29 NOTE — Telephone Encounter (Signed)
[facility] - Client [place] [facility] Patient Name: Christy May Gender: Female DOB: 10-10-1950 Age: 64 Y 2 M 4 D Return Phone Number: [phone] (Primary), [phone] (Secondary) Address: City/State/Zip: [place] Client [facility] - Client Client Site [place] - Day Physician Cathlean Cower Contact Type Call Call Type Triage / Clinical Relationship To Patient Self Appointment Disposition EMR Appointment Attempted - Not Scheduled Info pasted into Epic Yes Return Phone Number [phone] (Primary) Chief Complaint Hand Pain Initial Comment Caller says she has diabetic neuropathy on her hands and her pain is really bad. The pain wakes her up PreDisposition Home Care Nurse Assessment Nurse: Mallie Mussel, RN, Alveta Heimlich Date/Time Eilene Ghazi Time): 01/25/2015 1:56:35 PM Confirm and document reason for call. If symptomatic, describe symptoms. ---Caller states that she thinks she is in a diabetes crisis. Her diabetic neuropathy has been worse for a few days. Her blood sugar was 79 this morning. Current reading is 136. She rates her pain in her hands as 15 on 0-10 scale. She took Gabapentin and Tramadol which does not help with her pain. Has the patient traveled out of the country within the last 30 days? ---No Does the patient require triage? ---Yes Related visit to physician within the last 2 weeks? ---No Does the PT have any chronic conditions? (i.e. diabetes, asthma, etc.) ---Yes List chronic conditions. ---Diabetes, HTN, Guidelines Guideline Title Affirmed Question Affirmed Notes Nurse Date/Time (Eastern Time) Hand and Wrist Pain [1] SEVERE pain (e.g., excruciating, unable to use hand at all) AND [2] not improved after 2 hours of pain medicine Mallie Mussel, RN, Alveta Heimlich 01/25/2015 2:02:38 PM Disp. Time Eilene Ghazi Time) Disposition Final User 01/25/2015 2:04:01 PM See Physician within 4 Hours (or PCP triage) Yes  Mallie Mussel, RN, Alveta Heimlich PLEASE NOTE: All timestamps contained within this report are represented as [place] Standard Time. CONFIDENTIALTY NOTICE: This fax transmission is intended only for the addressee. It contains information that is legally privileged, confidential or otherwise protected from use or disclosure. If you are not the intended recipient, you are strictly prohibited from reviewing, disclosing, copying using or disseminating any of this information or taking any action in reliance on or regarding this information. If you have received this fax in error, please notify us immediately by telephone so that we can arrange for its return to [place]. Phone: [phone], Toll-Free: [phone], Fax: [phone] Page: 2 of 2 Call Id: [id number] Caller Understands: Yes Disagree/Comply: Comply Care Advice Given Per Guideline SEE PHYSICIAN WITHIN 4 HOURS (or PCP triage): * You become worse. After Care Instructions Given Call Event Type User Date / Time Description Comments User: Reeves Forth, RN Date/Time Eilene Ghazi Time): 01/25/2015 2:13:00 PM No appointments available at [facility] or [place]. She will be either going to the UC by the hospital on [facility] or the one on [address]. Not sure which at this time. Referrals GO TO FACILITY UNDECIDED

## 2015-01-30 ENCOUNTER — Other Ambulatory Visit: Payer: Self-pay | Admitting: Internal Medicine

## 2015-02-07 ENCOUNTER — Ambulatory Visit (INDEPENDENT_AMBULATORY_CARE_PROVIDER_SITE_OTHER): Payer: Medicare Other | Admitting: Podiatry

## 2015-02-07 DIAGNOSIS — B351 Tinea unguium: Secondary | ICD-10-CM | POA: Diagnosis not present

## 2015-02-07 DIAGNOSIS — Q828 Other specified congenital malformations of skin: Secondary | ICD-10-CM | POA: Diagnosis not present

## 2015-02-07 DIAGNOSIS — M2041 Other hammer toe(s) (acquired), right foot: Secondary | ICD-10-CM

## 2015-02-07 DIAGNOSIS — M79606 Pain in leg, unspecified: Secondary | ICD-10-CM | POA: Diagnosis not present

## 2015-02-07 DIAGNOSIS — E0841 Diabetes mellitus due to underlying condition with diabetic mononeuropathy: Secondary | ICD-10-CM

## 2015-02-08 ENCOUNTER — Encounter: Payer: Self-pay | Admitting: Internal Medicine

## 2015-02-08 ENCOUNTER — Ambulatory Visit (INDEPENDENT_AMBULATORY_CARE_PROVIDER_SITE_OTHER): Payer: Medicare Other | Admitting: Internal Medicine

## 2015-02-08 VITALS — BP 130/82 | HR 73 | Temp 97.8°F | Ht 64.0 in | Wt 303.0 lb

## 2015-02-08 DIAGNOSIS — G5602 Carpal tunnel syndrome, left upper limb: Secondary | ICD-10-CM

## 2015-02-08 DIAGNOSIS — G5603 Carpal tunnel syndrome, bilateral upper limbs: Secondary | ICD-10-CM

## 2015-02-08 DIAGNOSIS — G5601 Carpal tunnel syndrome, right upper limb: Secondary | ICD-10-CM

## 2015-02-08 DIAGNOSIS — M79641 Pain in right hand: Secondary | ICD-10-CM

## 2015-02-08 DIAGNOSIS — K219 Gastro-esophageal reflux disease without esophagitis: Secondary | ICD-10-CM | POA: Diagnosis not present

## 2015-02-08 DIAGNOSIS — G894 Chronic pain syndrome: Secondary | ICD-10-CM

## 2015-02-08 DIAGNOSIS — I10 Essential (primary) hypertension: Secondary | ICD-10-CM | POA: Diagnosis not present

## 2015-02-08 DIAGNOSIS — M79642 Pain in left hand: Secondary | ICD-10-CM

## 2015-02-08 MED ORDER — HYDROCODONE-ACETAMINOPHEN 5-325 MG PO TABS: 1.0000 | ORAL_TABLET | Freq: Three times a day (TID) | ORAL | Status: DC | PRN

## 2015-02-08 MED ORDER — ONDANSETRON HCL 4 MG PO TABS: 4.0000 mg | ORAL_TABLET | Freq: Three times a day (TID) | ORAL | Status: DC | PRN

## 2015-02-08 NOTE — Assessment & Plan Note (Signed)
stable overall by history and exam, recent data reviewed with pt, and pt to continue medical treatment as before,  to f/u any worsening symptoms or concerns BP Readings from Last 3 Encounters:  02/08/15 130/82  01/25/15 124/72  01/24/15 128/80

## 2015-02-08 NOTE — Patient Instructions (Signed)
Please continue all other medications as before, and refills have been done if requested - the zofran, and hydrocodone  You will be contacted regarding the referral for: Dr Danae Chen surgury  Please have the pharmacy call with any other refills you may need.  Please continue your efforts at being more active, low cholesterol diet, and weight control.  Please keep your appointments with your specialists as you may have planned

## 2015-02-08 NOTE — Assessment & Plan Note (Signed)
D/w pt, if pain overall become chronic requiring hydrocodone, i would suggest pain management referral

## 2015-02-08 NOTE — Progress Notes (Signed)
Subjective:     Patient ID: Christy May, female   DOB: 19-Jan-1951, 64 y.o.   MRN: [id number]  HPI patient presents with nailbeds that are painful and lesions under the fifth metatarsals of both feet with long-term diabetes and extreme obesity is complicating factor   Review of Systems     Objective:   Physical Exam Neurovascular status diminished both sharp [employer] vibratory in pulses PT and DP but unchanged from previous visit with no other change in health history and is noted to have thick yellow brittle nailbeds 1-5 both feet that are painful and keratotic lesion sub-5 bilateral that bother her and makes shoe gear difficult    Assessment:     At risk diabetic with pre-ulcerated of type callus formation diminished circulatory and neurological sensation and painful nailbeds 1-5 both feet    Plan:     Discussed diabetic shoes and we'll get approval and debrided nailbeds 1-5 both feet and lesions on both feet with no iatrogenic bleeding noted

## 2015-02-08 NOTE — Assessment & Plan Note (Signed)
For pain med refill, refer Dr Apolonio Schneiders as cortison helped last yr,  to f/u any worsening symptoms or concerns

## 2015-02-08 NOTE — Progress Notes (Signed)
Subjective:    Patient ID: Christy May, female    DOB: Oct 17, 1950, 64 y.o.   MRN: [id number]  HPI  Here to f/u; overall doing ok,  Pt denies chest pain, increasing sob or doe, wheezing, orthopnea, PND, increased LE swelling, palpitations, dizziness or syncope.  Pt denies new neurological symptoms such as new headache, or facial or extremity weakness or numbness.  Pt denies polydipsia, polyuria, or low sugar episode.   Pt denies new neurological symptoms such as new headache, or facial or extremity weakness or numbness. Except for the above.  Has been seen per Dr Danae Chen surgeon with bilat CTS by what sounds like EMG/NCS, s/po cortisone. Pt delcines CTS surgury.   Pt states overall good compliance with meds, mostly trying to follow appropriate diet, with wt overall stable,  but little exercise however.  Has ongoing pain with neuropathy and increased hydrocodone use recently. Also sees sport med with Right rafdiculopathy. Also Sees endo next aug 30.  Asks for zofran , and hydrocodone refill. Past Medical History  Diagnosis Date  . Anemia, unspecified   . Dehydration   . Depressive disorder, not elsewhere classified   . Esophageal reflux   . Unspecified essential hypertension   . Unspecified menopausal and postmenopausal disorder   . Obesity, unspecified   . Inflammatory and toxic neuropathy, unspecified   . Unspecified vitamin D deficiency   . Dysphagia 2007    historyof dysphagia with severe dysmotility by barium swallow-[name]  . Obesity   . Hypertension   . Allergic bronchitis   . Complication of anesthesia     "I've had recall; I probably stopped breathing at least 2 times" (09/03/2014)  . Family history of adverse reaction to anesthesia     "my brother was told they lost him a couple times during OR"  . OSA (obstructive sleep apnea)     "stopped using my CPAP; just couldn't use it cause it kept me awake and I couldn't keep it on my face'"(09/03/2014)  . Heart murmur   .  Chronic airway obstruction, not elsewhere classified     "I was told I don't have this/tests done @ [facility] 05/2014"  . Extrinsic asthma, unspecified     "I was told I don't have this/tests done @ [facility] 05/2014"  . Type II diabetes mellitus   . History of hiatal hernia   . Headache     "weekly to monthly" (09/03/2014)  . Migraine     "couple times/month right now" (09/03/2014)  . Spondylosis of unspecified site without mention of myelopathy   . Arthritis     "neck, back, hands" (09/03/2014)  . Diabetic peripheral neuropathy   . Chronic back pain   . Allergy   . Anxiety    Past Surgical History  Procedure Laterality Date  . Refractive surgery Bilateral   . Anterior cervical decomp/discectomy fusion      multiple cervical spine levels;Dr. Arnoldo Morale  . Incision and drainage abscess      Chest  . Dilation and curettage of uterus    . Exploratory laparotomy    . Total knee arthroplasty Left 10/2003  . Total knee arthroplasty Right 01/2004    [name]  . Foot surgery Right X 2    "took blood out of my arms and put platelets in my feet"  . Appendectomy    . Tonsillectomy    . Esophagogastroduodenoscopy N/A 05/08/2014    Procedure: ESOPHAGOGASTRODUODENOSCOPY (EGD);  Surgeon: [name], MD;  Location:  [facility];  Service: Endoscopy;  Laterality: N/A;  . Colonoscopy N/A 05/08/2014    Procedure: COLONOSCOPY;  Surgeon: [name], MD;  Location: [facility]OS[facility];  Service: Endoscopy;  Laterality: N/A;  . Back surgery    . Cholecystectomy open    . Abdominal hysterectomy      "partial"  . Eye surgery    . Joint replacement      reports that she quit smoking about 28 years ago. Her smoking use included Cigarettes. She has a 20 pack-year smoking history. She has never used smokeless tobacco. She reports that she drinks alcohol. She reports that she does not use illicit drugs. family history includes Colon polyps in her brother; Diabetes in her sister and another family member;  Esophageal cancer in her brother and sister; Heart disease in her maternal grandfather; Pancreatic cancer in her sister. Allergies  Allergen Reactions  . Ace Inhibitors Anaphylaxis  . Penicillins Hives and Rash  . Tape Hives    Paper tape is ok to use  . Adhesive [Tape] Rash   Current Outpatient Prescriptions on File Prior to Visit  Medication Sig Dispense Refill  . albuterol (PROVENTIL) (2.5 MG/3ML) 0.083% nebulizer solution Take 2.5 mg by nebulization every 6 (six) hours as needed for wheezing or shortness of breath.    Marland Kitchen aspirin 81 MG tablet Take 81 mg by mouth at bedtime.     . cholecalciferol (VITAMIN D) 1000 UNITS tablet Take 2,000 Units by mouth every morning.     . diclofenac sodium (VOLTAREN) 1 % GEL Apply 4 g topically 4 (four) times daily as needed (right hand, arm and neck pain.).    Marland Kitchen famotidine (PEPCID) 20 MG tablet TAKE 1 TABLET BY MOUTH EVERY NIGHT AT BEDTIME 30 tablet 5  . Fish Oil OIL by Does not apply route.    . fluticasone (FLONASE) 50 MCG/ACT nasal spray Place 2 sprays into both nostrils every morning. 16 g 6  . gabapentin (NEURONTIN) 800 MG tablet TAKE 1 TABLET BY MOUTH THREE TIMES DAILY 270 tablet 1  . glucosamine-chondroitin 500-400 MG tablet Take 1 tablet by mouth 3 (three) times daily.    Marland Kitchen HYDROcodone-acetaminophen (NORCO/VICODIN) 5-325 MG per tablet Take 1 tablet by mouth every 8 (eight) hours as needed. 20 tablet 0  . insulin NPH Human (HUMULIN N) 100 UNIT/ML injection Inject 170 units every morning. 60 mL 3  . irbesartan (AVAPRO) 300 MG tablet TAKE 1 TABLET BY MOUTH DAILY 90 tablet 0  . Lancets (ONETOUCH ULTRASOFT) lancets Use to test blood sugars twice a day. 100 each 12  . loratadine (CLARITIN) 10 MG tablet TAKE 1 TABLET BY MOUTH EVERY DAY 30 tablet 5  . metoprolol (LOPRESSOR) 50 MG tablet Take 1.5 tablets (75 mg total) by mouth 2 (two) times daily. 270 tablet 3  . montelukast (SINGULAIR) 10 MG tablet Take 10 mg by mouth at bedtime.    Marland Kitchen nystatin cream  (MYCOSTATIN) APPLY TO AFFECTED AREA TWICE DAILY 30 g 0  . omeprazole (PRILOSEC) 40 MG capsule Take 1 capsule (40 mg total) by mouth daily. 30 capsule 5  . ondansetron (ZOFRAN ODT) 4 MG disintegrating tablet Take 1 tablet (4 mg total) by mouth every 8 (eight) hours as needed for nausea or vomiting. 20 tablet 0  . ONE TOUCH ULTRA TEST test strip USE TO TEST BLOOD SUGARS TWICE DAILY 100 each 2  . oxybutynin (DITROPAN-XL) 10 MG 24 hr tablet TAKE 1 TABLET BY MOUTH DAILY 90 tablet 1  . promethazine (  PHENERGAN) 25 MG tablet TAKE 1 TABLET(25 MG) BY MOUTH EVERY 6 HOURS AS NEEDED FOR NAUSEA OR VOMITING 30 tablet 0  . traMADol (ULTRAM) 50 MG tablet Take 1 tablet (50 mg total) by mouth every 6 (six) hours as needed. 60 tablet 2   No current facility-administered medications on file prior to visit.    Review of Systems  Constitutional: Negative for unusual diaphoresis or night sweats HENT: Negative for ringing in ear or discharge Eyes: Negative for double vision or worsening visual disturbance.  Respiratory: Negative for choking and stridor.   Gastrointestinal: Negative for vomiting or other signifcant bowel change Genitourinary: Negative for hematuria or change in urine volume.  Musculoskeletal: Negative for other MSK pain or swelling Skin: Negative for color change and worsening wound.  Neurological: Negative for tremors and numbness other than noted  Psychiatric/Behavioral: Negative for decreased concentration or agitation other than above       Objective:   Physical Exam BP 130/82 mmHg  Pulse 73  Temp(Src) 97.8 F (36.6 C) (Oral)  Ht 5\' 4"  (1.626 m)  Wt 303 lb (137.44 kg)  BMI 51.98 kg/m2  SpO2 95% VS noted, not ill appearing, morbid oese Constitutional: Pt appears in no significant distress HENT: Head: NCAT.  Right Ear: External ear normal.  Left Ear: External ear normal.  Eyes: . Pupils are equal, round, and reactive to light. Conjunctivae and EOM are normal Neck: Normal range of  motion. Neck supple.  Cardiovascular: Normal rate and regular rhythm.   Pulmonary/Chest: Effort normal and breath sounds without rales or wheezing.  Abd:  Soft, NT, ND, + BS Neurological: Pt is alert. Not confused , motor grossly intact Skin: Skin is warm. No rash, no LE edema Psychiatric: Pt behavior is normal. No agitation. 1+ nervous    Assessment & Plan:

## 2015-02-08 NOTE — Assessment & Plan Note (Signed)
Stable, for zofran prn, also cont PPI,  to f/u any worsening symptoms or concerns

## 2015-02-11 ENCOUNTER — Encounter: Payer: Self-pay | Admitting: Emergency Medicine

## 2015-02-13 ENCOUNTER — Telehealth: Payer: Self-pay | Admitting: Emergency Medicine

## 2015-02-13 MED ORDER — PREDNISONE 10 MG PO TABS: ORAL_TABLET | ORAL | Status: DC

## 2015-02-13 NOTE — Telephone Encounter (Signed)
Prednisone taper, if no improvement then acute visit Take 40mg  po daily for 3 days, then take 30mg  po daily for 3 days, then take 20mg  po daily for two days, then take 10mg  po daily for 2 days

## 2015-02-13 NOTE — Telephone Encounter (Signed)
lmtcb for pt.  

## 2015-02-13 NOTE — Telephone Encounter (Signed)
Pt returned call. Pt c/o increase in SOB, cough with intermittent mucus production when eating - clear mucus, chest congestion, and chest tightness when SOB x 4 days. Pt denies f/c/s, hemoptysis, swelling. Will send message to DOD as RB is unavailable.   Dr. Lake Bells please advise.

## 2015-02-13 NOTE — Telephone Encounter (Signed)
Pt aware of recs. RX sent in. Nothing further needed 

## 2015-02-18 ENCOUNTER — Other Ambulatory Visit: Payer: Self-pay | Admitting: *Deleted

## 2015-02-18 ENCOUNTER — Other Ambulatory Visit: Payer: Self-pay

## 2015-02-18 ENCOUNTER — Telehealth: Payer: Self-pay | Admitting: *Deleted

## 2015-02-18 MED ORDER — OXYBUTYNIN CHLORIDE ER 10 MG PO TB24: 10.0000 mg | ORAL_TABLET | Freq: Every day | ORAL | Status: DC

## 2015-02-18 MED ORDER — "INSULIN SYRINGE 30G X 5/16"" 1 ML MISC": Status: DC

## 2015-02-18 MED ORDER — IRBESARTAN 300 MG PO TABS: 300.0000 mg | ORAL_TABLET | Freq: Every day | ORAL | Status: DC

## 2015-02-18 NOTE — Telephone Encounter (Signed)
Sent Irbesartan& inhaler to pharmacy.../lm,b

## 2015-02-19 ENCOUNTER — Ambulatory Visit (INDEPENDENT_AMBULATORY_CARE_PROVIDER_SITE_OTHER): Payer: Medicare Other | Admitting: Endocrinology

## 2015-02-19 ENCOUNTER — Encounter: Payer: Self-pay | Admitting: Endocrinology

## 2015-02-19 VITALS — BP 137/82 | HR 87 | Temp 97.9°F | Ht 64.0 in | Wt 303.0 lb

## 2015-02-19 DIAGNOSIS — E1041 Type 1 diabetes mellitus with diabetic mononeuropathy: Secondary | ICD-10-CM

## 2015-02-19 DIAGNOSIS — E1065 Type 1 diabetes mellitus with hyperglycemia: Principal | ICD-10-CM

## 2015-02-19 DIAGNOSIS — IMO0002 Reserved for concepts with insufficient information to code with codable children: Secondary | ICD-10-CM

## 2015-02-19 DIAGNOSIS — E1049 Type 1 diabetes mellitus with other diabetic neurological complication: Secondary | ICD-10-CM

## 2015-02-19 LAB — POCT GLYCOSYLATED HEMOGLOBIN (HGB A1C): HEMOGLOBIN A1C: 9.1

## 2015-02-19 MED ORDER — INSULIN NPH (HUMAN) (ISOPHANE) 100 UNIT/ML ~~LOC~~ SUSP: 190.0000 [IU] | SUBCUTANEOUS | Status: DC

## 2015-02-19 NOTE — Patient Instructions (Addendum)
Your a1c today was 9.1 (was 10.6 last time).  Our goal is the 7s.   Some specialists say that taking a high amount of folic acid (4-5 mg per day) helps the neuropathy. Please increase the insulin to 190 units each morning.  On this type of insulin schedule, you should eat meals on a regular schedule.  If a meal is missed or significantly delayed, your blood sugar could go low. check your blood sugar twice a day.  vary the time of day when you check, between before the 3 meals, and at bedtime.  also check if you have symptoms of your blood sugar being too high or too low.  please keep a record of the readings and bring it to your next appointment here.  You can write it on any piece of paper.  please call us sooner if your blood sugar goes below 70, or if you have a lot of readings over 200. Please come back for a follow-up appointment in 3 months.

## 2015-02-19 NOTE — Progress Notes (Signed)
Subjective:    Patient ID: Christy May, female    DOB: 1951/06/05, 64 y.o.   MRN: [id number]  HPI Pt returns for f/u of diabetes mellitus: DM type: Insulin-requiring type 2 Dx'ed: 0263 Complications: polyneuropathy Therapy: insulin since 2002.   GDM: never DKA: never Severe hypoglycemia: never. Pancreatitis: never Other: she was changed to a simpler qd insulin regimen, after poor results on multiple daily injections; she wants the cheapest possible insulin.  She declines weight loss surgery. Interval history: She takes 180 units qam.  she brings a record of her cbg's which i have reviewed today.  It varies from 99-263.  There is no trend throughout the day.  She took a course of steroids starting 6 days ago.  pt states she feels well in general, except chronic pain and numbness of the hands and feet is worse. Past Medical History  Diagnosis Date  . Anemia, unspecified   . Dehydration   . Depressive disorder, not elsewhere classified   . Esophageal reflux   . Unspecified essential hypertension   . Unspecified menopausal and postmenopausal disorder   . Obesity, unspecified   . Inflammatory and toxic neuropathy, unspecified   . Unspecified vitamin D deficiency   . Dysphagia 2007    historyof dysphagia with severe dysmotility by barium swallow-[name]  . Obesity   . Hypertension   . Allergic bronchitis   . Complication of anesthesia     "I've had recall; I probably stopped breathing at least 2 times" (09/03/2014)  . Family history of adverse reaction to anesthesia     "my brother was told they lost him a couple times during OR"  . OSA (obstructive sleep apnea)     "stopped using my CPAP; just couldn't use it cause it kept me awake and I couldn't keep it on my face'"(09/03/2014)  . Heart murmur   . Chronic airway obstruction, not elsewhere classified     "I was told I don't have this/tests done @ [facility] 05/2014"  . Extrinsic asthma, unspecified     "I was told I don't  have this/tests done @ [facility] 05/2014"  . Type II diabetes mellitus   . History of hiatal hernia   . Headache     "weekly to monthly" (09/03/2014)  . Migraine     "couple times/month right now" (09/03/2014)  . Spondylosis of unspecified site without mention of myelopathy   . Arthritis     "neck, back, hands" (09/03/2014)  . Diabetic peripheral neuropathy   . Chronic back pain   . Allergy   . Anxiety     Past Surgical History  Procedure Laterality Date  . Refractive surgery Bilateral   . Anterior cervical decomp/discectomy fusion      multiple cervical spine levels;Dr. Arnoldo Morale  . Incision and drainage abscess      Chest  . Dilation and curettage of uterus    . Exploratory laparotomy    . Total knee arthroplasty Left 10/2003  . Total knee arthroplasty Right 01/2004    [name]  . Foot surgery Right X 2    "took blood out of my arms and put platelets in my feet"  . Appendectomy    . Tonsillectomy    . Esophagogastroduodenoscopy N/A 05/08/2014    Procedure: ESOPHAGOGASTRODUODENOSCOPY (EGD);  Surgeon: [name], MD;  Location: [name];  Service: Endoscopy;  Laterality: N/A;  . Colonoscopy N/A 05/08/2014    Procedure: COLONOSCOPY;  Surgeon: [name], MD;  Location: [name];  Service: Endoscopy;  Laterality: N/A;  . Back surgery    . Cholecystectomy open    . Abdominal hysterectomy      "partial"  . Eye surgery    . Joint replacement      Social History   Social History  . Marital Status: Widowed    Spouse Name: N/A  . Number of Children: N/A  . Years of Education: N/A   Occupational History  . disabled    Social History Main Topics  . Smoking status: Former Smoker -- 1.00 packs/day for 20 years    Types: Cigarettes    Quit date: 06/22/1986  . Smokeless tobacco: Never Used  . Alcohol Use: Yes     Comment: "stopped drinking in ~ 1983"  . Drug Use: No  . Sexual Activity: No   Other Topics Concern  . Not on file   Social History Narrative    ** Merged History Encounter **       Patient does not get regular exercise.      Widowed 2004      Disabled    Current Outpatient Prescriptions on File Prior to Visit  Medication Sig Dispense Refill  . albuterol (PROVENTIL) (2.5 MG/3ML) 0.083% nebulizer solution Take 2.5 mg by nebulization every 6 (six) hours as needed for wheezing or shortness of breath.    Marland Kitchen aspirin 81 MG tablet Take 81 mg by mouth at bedtime.     . cholecalciferol (VITAMIN D) 1000 UNITS tablet Take 2,000 Units by mouth every morning.     . diclofenac sodium (VOLTAREN) 1 % GEL Apply 4 g topically 4 (four) times daily as needed (right hand, arm and neck pain.).    Marland Kitchen famotidine (PEPCID) 20 MG tablet TAKE 1 TABLET BY MOUTH EVERY NIGHT AT BEDTIME 30 tablet 5  . Fish Oil OIL by Does not apply route.    . fluticasone (FLONASE) 50 MCG/ACT nasal spray Place 2 sprays into both nostrils every morning. 16 g 6  . gabapentin (NEURONTIN) 800 MG tablet TAKE 1 TABLET BY MOUTH THREE TIMES DAILY 270 tablet 1  . glucosamine-chondroitin 500-400 MG tablet Take 1 tablet by mouth 3 (three) times daily.    Marland Kitchen HYDROcodone-acetaminophen (NORCO/VICODIN) 5-325 MG per tablet Take 1 tablet by mouth every 8 (eight) hours as needed. 60 tablet 0  . Insulin Syringe-Needle U-100 (INSULIN SYRINGE 1CC/30GX5/16") 30G X 5/16" 1 ML MISC Use to inject insulin 2 times per day. 100 each 2  . irbesartan (AVAPRO) 300 MG tablet Take 1 tablet (300 mg total) by mouth daily. 90 tablet 1  . Lancets (ONETOUCH ULTRASOFT) lancets Use to test blood sugars twice a day. 100 each 12  . loratadine (CLARITIN) 10 MG tablet TAKE 1 TABLET BY MOUTH EVERY DAY 30 tablet 5  . metoprolol (LOPRESSOR) 50 MG tablet Take 1.5 tablets (75 mg total) by mouth 2 (two) times daily. 270 tablet 3  . montelukast (SINGULAIR) 10 MG tablet Take 10 mg by mouth at bedtime.    Marland Kitchen nystatin cream (MYCOSTATIN) APPLY TO AFFECTED AREA TWICE DAILY 30 g 0  . omeprazole (PRILOSEC) 40 MG capsule Take 1 capsule (40  mg total) by mouth daily. 30 capsule 5  . ondansetron (ZOFRAN ODT) 4 MG disintegrating tablet Take 1 tablet (4 mg total) by mouth every 8 (eight) hours as needed for nausea or vomiting. 20 tablet 0  . ondansetron (ZOFRAN) 4 MG tablet Take 1 tablet (4 mg total) by mouth every 8 (eight) hours as  needed for nausea or vomiting. 40 tablet 1  . ONE TOUCH ULTRA TEST test strip USE TO TEST BLOOD SUGARS TWICE DAILY 100 each 2  . oxybutynin (DITROPAN-XL) 10 MG 24 hr tablet Take 1 tablet (10 mg total) by mouth daily. 90 tablet 1  . predniSONE (DELTASONE) 10 MG tablet Take 40mg  po daily for 3 days, then take 30mg  po daily for 3 days, then take 20mg  po daily for two days, then take 10mg  po daily for 2 days 27 tablet 0  . promethazine (PHENERGAN) 25 MG tablet TAKE 1 TABLET(25 MG) BY MOUTH EVERY 6 HOURS AS NEEDED FOR NAUSEA OR VOMITING 30 tablet 0  . traMADol (ULTRAM) 50 MG tablet Take 1 tablet (50 mg total) by mouth every 6 (six) hours as needed. 60 tablet 2   No current facility-administered medications on file prior to visit.    Allergies  Allergen Reactions  . Ace Inhibitors Anaphylaxis  . Penicillins Hives and Rash  . Tape Hives    Paper tape is ok to use  . Adhesive [Tape] Rash    Family History  Problem Relation Age of Onset  . Esophageal cancer Brother   . Esophageal cancer Sister     ?  . Colon polyps Brother   . Pancreatic cancer Sister     ?  . Diabetes Sister   . Diabetes      Aunt and Uncle  . Heart disease Maternal Grandfather     BP 137/82 mmHg  Pulse 87  Temp(Src) 97.9 F (36.6 C) (Oral)  Ht 5\' 4"  (1.626 m)  Wt 303 lb (137.44 kg)  BMI 51.98 kg/m2  SpO2 97%  Review of Systems She denies hypoglycemia.    Objective:   Physical Exam VITAL SIGNS:  See vs page GENERAL: no distress Pulses: dorsalis pedis intact bilat.   MSK: no deformity of the feet CV: no leg edema Skin:  no ulcer on the feet.  normal color and temp on the feet. Neuro: sensation is intact to touch on  the feet  A1c=9.1%    Assessment & Plan:  DM: she needs increased rx. Neuropathy: worse   Patient is advised the following: Patient Instructions  Your a1c today was 9.1 (was 10.6 last time).  Our goal is the 7s.   Some specialists say that taking a high amount of folic acid (4-5 mg per day) helps the neuropathy. Please increase the insulin to 190 units each morning.  On this type of insulin schedule, you should eat meals on a regular schedule.  If a meal is missed or significantly delayed, your blood sugar could go low. check your blood sugar twice a day.  vary the time of day when you check, between before the 3 meals, and at bedtime.  also check if you have symptoms of your blood sugar being too high or too low.  please keep a record of the readings and bring it to your next appointment here.  You can write it on any piece of paper.  please call us sooner if your blood sugar goes below 70, or if you have a lot of readings over 200. Please come back for a follow-up appointment in 3 months.

## 2015-02-20 ENCOUNTER — Telehealth: Payer: Self-pay | Admitting: Endocrinology

## 2015-02-20 MED ORDER — FOLIC ACID 1 MG PO TABS: 4.0000 mg | ORAL_TABLET | Freq: Every day | ORAL | Status: DC

## 2015-02-20 NOTE — Telephone Encounter (Signed)
Pt called in and her sugar is 66 with no insulin  , please call pt and advise  [phone]

## 2015-02-20 NOTE — Telephone Encounter (Signed)
See note below and please advise, Thanks! 

## 2015-02-20 NOTE — Telephone Encounter (Signed)
Ok, please skip 1 day of insulin, then reduce to 180 units qam

## 2015-02-20 NOTE — Telephone Encounter (Signed)
Pt advised of note below. Pt voiced understanding. Pt also stated yesterday she went to her pharmacy and was advised by the pharmacist she would be better off receiving a prescription for the the folic acid tablets then buying otc. Pt stated she was advised she would have to take about 10 tablets a day to get the necessary amount bt using the otc supplement. Please advise, Thanks!

## 2015-02-20 NOTE — Telephone Encounter (Signed)
i have sent a prescription to your pharmacy 

## 2015-02-20 NOTE — Telephone Encounter (Signed)
Pt advised of note below and voiced understanding.  

## 2015-02-22 ENCOUNTER — Telehealth: Payer: Self-pay

## 2015-02-22 DIAGNOSIS — G5602 Carpal tunnel syndrome, left upper limb: Secondary | ICD-10-CM | POA: Diagnosis not present

## 2015-02-22 DIAGNOSIS — G5601 Carpal tunnel syndrome, right upper limb: Secondary | ICD-10-CM | POA: Diagnosis not present

## 2015-02-22 NOTE — Telephone Encounter (Signed)
Pt advised of note below and voiced understanding.  

## 2015-02-22 NOTE — Telephone Encounter (Signed)
Please increase to 210 units qam x 3 days, then return to 180 units Please call next week if not better, or sooner if worse.

## 2015-02-22 NOTE — Telephone Encounter (Signed)
Pt called to report blood sugar readings.  02/21/2015: 10:45 am: 320 2:08 pm: 244  02/22/2015:  Fasting: 217  3:00 pm: 244  Pt stated she received a steroid injection today. Pt is currently taking 180 units of Humulin N in the morning.

## 2015-02-26 ENCOUNTER — Telehealth: Payer: Self-pay | Admitting: Endocrinology

## 2015-02-26 ENCOUNTER — Other Ambulatory Visit: Payer: Self-pay | Admitting: Internal Medicine

## 2015-02-26 NOTE — Telephone Encounter (Signed)
Done hardcopy to Dahlia  

## 2015-02-26 NOTE — Telephone Encounter (Signed)
[facility] note dated 02/25/15 9:40 AM: Chief Complaint Blood Sugar Low Initial Comment Caller states blood sugar is 65. Shaking. PreDisposition Call Doctor Nurse Assessment Nurse: Marcelline Deist, RN, Kermit Balo Date/Time Eilene Ghazi Time): 02/25/2015 9:40:39 AM Confirm and document reason for call. If symptomatic, describe symptoms. ---Caller states blood sugar is 65 this am at 9:30 when she got up. Feeling shaky & slightly weak. Drank some orange juice. Feeling a little better as she was talking to the nurse. Had a steroid shot on Friday, was told to increase her Humulin N insulin from 180 units to 210 units on Sat. (220 when she woke up), Sunday (didn't check it) & Monday. On Tuesday, supposed to go back down to 180. Her blood sugar went down on 8/31, was told not to take her insulin that day & wait until the next day. Her blood sugars tend to run high. Normally takes the insulin once a day. Checks her blood sugar a couple times a day. Has the patient traveled out of the country within the last 30 days? ---Not Applicable Does the patient require triage? ---Yes Related visit to physician within the last 2 weeks? ---Yes Does the PT have any chronic conditions? (i.e. diabetes, asthma, etc.) ---Yes List chronic conditions. 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 Time Eilene Ghazi Time) Disposition Final User 02/25/2015 9:59:45 AM Call PCP within 24 Hours Yes Marcelline Deist, RN, Kermit Balo PLEASE NOTE: All timestamps contained within this report are represented as [place] Standard Time. CONFIDENTIALTY NOTICE: This fax transmission is intended only for the addressee. It contains  information that is legally privileged, confidential or otherwise protected from use or disclosure. If you are not the intended recipient, you are strictly prohibited from reviewing, disclosing, copying using or disseminating any of this information or taking any action in reliance on or regarding this information. If you have received this fax in error, please notify us immediately by telephone so that we can arrange for its return to [place]. Phone: [phone], Toll-Free: [phone], Fax: [phone] Page: 2 of 2 Call Id: [id number] Caller Understands: Yes Disagree/Comply: Comply Care Advice Given Per Guideline CALL PCP WITHIN 24 HOURS: You need to discuss this with your doctor within the next 24 hours. ASYMPTOMATIC AND BLOOD GLUCOSE under 70 mg/dl (3.9 mmol/l): If the patient has no symptoms of low blood sugar and the blood glucose is under 70 mg/dl (3.9 mmol/l); the patient should RECHECK his/her blood glucose level now. LOW BLOOD SUGAR (HYPOGLYCEMIA) - DEFINITION: * Defined as a blood glucose under 70 mg/dl (3.9 mmol/l). * Symptoms of mild hypoglycemia - shakiness, weakness, not thinking clearly, headache, trembling, sweating, dizziness, palpitations, and hunger. * Contributing factors - too much insulin, delayed meal, insufficient food, strenuous exercise, alcohol. LOW BLOOD SUGAR - TREATMENT - Eat some (15 gms) sugar NOW. Each of the following has the right amount of sugar: * Juice or soda (1/2 cup; 120 ml) * IF OFFICE WILL BE OPEN: Call the office when it opens tomorrow morning. CARE ADVICE given per Diabetes - Low Blood Sugar (Adult) guideline. * You become worse. CALL BACK IF: * Glucose under 70 mg/dl (3.9 mmol/l) and persists over 30 minutes LOW BLOOD SUGAR - EXPECTED COURSE: * The symptoms should start getting better  in 5 minutes. The symptoms of hypoglycemia should resolve in about 15 minutes. After the symptoms resolve, eat a small snack to prevent this from recurring. Examples include:  cheese and crackers, a glass of milk, or half a sandwich. * If the symptoms of hypoglycemia are not better in 15 minutes, eat some more glucose (10 gm). After Care Instructions Given Call Event Type User Date / Time Description Comments User: Donald Siva, RN Date/Time Eilene Ghazi Time): 02/25/2015 10:02:47 AM As nurse was speaking with patient, she reported feeling better after drinking orange juice. she will re-check her blood glucose in a little while, but for now, nurse feels she can withhold her insulin dose for this am & be in touch with her Dr. tomorrow when the office opens. the patient seems very familiar with signs & symptoms of both high & low blood glucose levels & is prepared for treating either as she lives alone. Referrals REFERRED TO PCP OFFICE

## 2015-02-26 NOTE — Telephone Encounter (Signed)
Pt advised of note below and voiced understanding.  

## 2015-02-26 NOTE — Telephone Encounter (Signed)
Please verify current insulin is 180 units qam. Then decrease to 170 units qam

## 2015-02-26 NOTE — Telephone Encounter (Signed)
Rx faxed to pharmacy  

## 2015-02-26 NOTE — Telephone Encounter (Signed)
See note below and to be advised.  Thanks!

## 2015-02-27 ENCOUNTER — Other Ambulatory Visit: Payer: Self-pay

## 2015-02-27 ENCOUNTER — Encounter (HOSPITAL_COMMUNITY): Payer: Self-pay | Admitting: Emergency Medicine

## 2015-02-27 DIAGNOSIS — F419 Anxiety disorder, unspecified: Secondary | ICD-10-CM | POA: Diagnosis not present

## 2015-02-27 DIAGNOSIS — N289 Disorder of kidney and ureter, unspecified: Secondary | ICD-10-CM | POA: Insufficient documentation

## 2015-02-27 DIAGNOSIS — R011 Cardiac murmur, unspecified: Secondary | ICD-10-CM | POA: Diagnosis not present

## 2015-02-27 DIAGNOSIS — Z87891 Personal history of nicotine dependence: Secondary | ICD-10-CM | POA: Insufficient documentation

## 2015-02-27 DIAGNOSIS — E559 Vitamin D deficiency, unspecified: Secondary | ICD-10-CM | POA: Diagnosis not present

## 2015-02-27 DIAGNOSIS — M1990 Unspecified osteoarthritis, unspecified site: Secondary | ICD-10-CM | POA: Insufficient documentation

## 2015-02-27 DIAGNOSIS — J449 Chronic obstructive pulmonary disease, unspecified: Secondary | ICD-10-CM | POA: Diagnosis not present

## 2015-02-27 DIAGNOSIS — Z880 Allergy status to penicillin: Secondary | ICD-10-CM | POA: Insufficient documentation

## 2015-02-27 DIAGNOSIS — M25551 Pain in right hip: Secondary | ICD-10-CM | POA: Diagnosis present

## 2015-02-27 DIAGNOSIS — Z794 Long term (current) use of insulin: Secondary | ICD-10-CM | POA: Insufficient documentation

## 2015-02-27 DIAGNOSIS — G8929 Other chronic pain: Secondary | ICD-10-CM | POA: Insufficient documentation

## 2015-02-27 DIAGNOSIS — Z79899 Other long term (current) drug therapy: Secondary | ICD-10-CM | POA: Diagnosis not present

## 2015-02-27 DIAGNOSIS — E669 Obesity, unspecified: Secondary | ICD-10-CM | POA: Insufficient documentation

## 2015-02-27 DIAGNOSIS — D649 Anemia, unspecified: Secondary | ICD-10-CM | POA: Diagnosis not present

## 2015-02-27 DIAGNOSIS — Z8719 Personal history of other diseases of the digestive system: Secondary | ICD-10-CM | POA: Insufficient documentation

## 2015-02-27 DIAGNOSIS — Z7982 Long term (current) use of aspirin: Secondary | ICD-10-CM | POA: Diagnosis not present

## 2015-02-27 DIAGNOSIS — Z8742 Personal history of other diseases of the female genital tract: Secondary | ICD-10-CM | POA: Insufficient documentation

## 2015-02-27 DIAGNOSIS — R109 Unspecified abdominal pain: Secondary | ICD-10-CM | POA: Diagnosis not present

## 2015-02-27 DIAGNOSIS — E1151 Type 2 diabetes mellitus with diabetic peripheral angiopathy without gangrene: Secondary | ICD-10-CM | POA: Diagnosis not present

## 2015-02-27 LAB — URINALYSIS, ROUTINE W REFLEX MICROSCOPIC
Bilirubin Urine: NEGATIVE
Glucose, UA: NEGATIVE mg/dL
Hgb urine dipstick: NEGATIVE
KETONES UR: NEGATIVE mg/dL
LEUKOCYTES UA: NEGATIVE
NITRITE: NEGATIVE
PH: 5.5 (ref 5.0–8.0)
Protein, ur: NEGATIVE mg/dL
SPECIFIC GRAVITY, URINE: 1.013 (ref 1.005–1.030)
Urobilinogen, UA: 0.2 mg/dL (ref 0.0–1.0)

## 2015-02-27 LAB — CBG MONITORING, ED: GLUCOSE-CAPILLARY: 67 mg/dL (ref 65–99)

## 2015-02-27 MED ORDER — OXYCODONE-ACETAMINOPHEN 5-325 MG PO TABS
ORAL_TABLET | ORAL | Status: AC
  Administered 2015-02-27: 1 via ORAL
  Filled 2015-02-27: qty 1

## 2015-02-27 MED ORDER — GABAPENTIN 800 MG PO TABS: 800.0000 mg | ORAL_TABLET | Freq: Three times a day (TID) | ORAL | Status: DC

## 2015-02-27 MED ORDER — OXYCODONE-ACETAMINOPHEN 5-325 MG PO TABS
1.0000 | ORAL_TABLET | Freq: Once | ORAL | Status: AC
  Administered 2015-02-27: 1 via ORAL

## 2015-02-27 NOTE — ED Notes (Signed)
Pt. reports right hip and low back pain onset yesterday , denies injury or fall , no dysuria or hematuria .

## 2015-02-27 NOTE — ED Notes (Signed)
Patient was given sandwich and OJ

## 2015-02-28 ENCOUNTER — Encounter (HOSPITAL_COMMUNITY): Payer: Self-pay | Admitting: Radiology

## 2015-02-28 ENCOUNTER — Other Ambulatory Visit: Payer: Self-pay | Admitting: Internal Medicine

## 2015-02-28 ENCOUNTER — Encounter: Payer: Self-pay | Admitting: Internal Medicine

## 2015-02-28 ENCOUNTER — Emergency Department (HOSPITAL_COMMUNITY): Payer: Medicare Other

## 2015-02-28 ENCOUNTER — Emergency Department (HOSPITAL_COMMUNITY)
Admission: EM | Admit: 2015-02-28 | Discharge: 2015-02-28 | Disposition: A | Discharge status: Home / Self Care | Payer: Medicare Other | Attending: Emergency Medicine | Admitting: Emergency Medicine

## 2015-02-28 ENCOUNTER — Ambulatory Visit: Payer: Medicare Other | Admitting: Internal Medicine

## 2015-02-28 ENCOUNTER — Telehealth: Payer: Self-pay | Admitting: Endocrinology

## 2015-02-28 DIAGNOSIS — R109 Unspecified abdominal pain: Secondary | ICD-10-CM | POA: Diagnosis not present

## 2015-02-28 DIAGNOSIS — N289 Disorder of kidney and ureter, unspecified: Secondary | ICD-10-CM

## 2015-02-28 DIAGNOSIS — D649 Anemia, unspecified: Secondary | ICD-10-CM

## 2015-02-28 LAB — COMPREHENSIVE METABOLIC PANEL
ALK PHOS: 64 U/L (ref 38–126)
ALT: 27 U/L (ref 14–54)
AST: 21 U/L (ref 15–41)
Albumin: 3.3 g/dL — ABNORMAL LOW (ref 3.5–5.0)
Anion gap: 8 (ref 5–15)
BILIRUBIN TOTAL: 0.3 mg/dL (ref 0.3–1.2)
BUN: 28 mg/dL — ABNORMAL HIGH (ref 6–20)
CALCIUM: 9.3 mg/dL (ref 8.9–10.3)
CO2: 28 mmol/L (ref 22–32)
CREATININE: 1.3 mg/dL — AB (ref 0.44–1.00)
Chloride: 100 mmol/L — ABNORMAL LOW (ref 101–111)
GFR, EST AFRICAN AMERICAN: 49 mL/min — AB (ref >60)
GFR, EST NON AFRICAN AMERICAN: 42 mL/min — AB (ref >60)
Glucose, Bld: 85 mg/dL (ref 65–99)
Potassium: 4.7 mmol/L (ref 3.5–5.1)
SODIUM: 136 mmol/L (ref 135–145)
TOTAL PROTEIN: 6.7 g/dL (ref 6.5–8.1)

## 2015-02-28 LAB — CBC WITH DIFFERENTIAL/PLATELET
BASOS PCT: 0 % (ref 0–1)
Basophils Absolute: 0 10*3/uL (ref 0.0–0.1)
EOS ABS: 0.2 10*3/uL (ref 0.0–0.7)
Eosinophils Relative: 2 % (ref 0–5)
HCT: 37.3 % (ref 36.0–46.0)
HEMOGLOBIN: 11.5 g/dL — AB (ref 12.0–15.0)
Lymphocytes Relative: 39 % (ref 12–46)
Lymphs Abs: 3.2 10*3/uL (ref 0.7–4.0)
MCH: 26.5 pg (ref 26.0–34.0)
MCHC: 30.8 g/dL (ref 30.0–36.0)
MCV: 85.9 fL (ref 78.0–100.0)
MONOS PCT: 7 % (ref 3–12)
Monocytes Absolute: 0.6 10*3/uL (ref 0.1–1.0)
NEUTROS PCT: 52 % (ref 43–77)
Neutro Abs: 4.3 10*3/uL (ref 1.7–7.7)
Platelets: 191 10*3/uL (ref 150–400)
RBC: 4.34 MIL/uL (ref 3.87–5.11)
RDW: 16.2 % — AB (ref 11.5–15.5)
WBC: 8.2 10*3/uL (ref 4.0–10.5)

## 2015-02-28 LAB — CBG MONITORING, ED: GLUCOSE-CAPILLARY: 76 mg/dL (ref 65–99)

## 2015-02-28 LAB — LIPASE, BLOOD: LIPASE: 19 U/L — AB (ref 22–51)

## 2015-02-28 MED ORDER — MORPHINE SULFATE (PF) 4 MG/ML IV SOLN
4.0000 mg | Freq: Once | INTRAVENOUS | Status: AC
  Administered 2015-02-28: 4 mg via INTRAVENOUS
  Filled 2015-02-28: qty 1

## 2015-02-28 MED ORDER — IOHEXOL 300 MG/ML  SOLN
100.0000 mL | Freq: Once | INTRAMUSCULAR | Status: AC | PRN
  Administered 2015-02-28: 100 mL via INTRAVENOUS

## 2015-02-28 MED ORDER — OXYCODONE-ACETAMINOPHEN 5-325 MG PO TABS: 1.0000 | ORAL_TABLET | ORAL | Status: DC | PRN

## 2015-02-28 MED ORDER — IOHEXOL 300 MG/ML  SOLN
25.0000 mL | Freq: Once | INTRAMUSCULAR | Status: AC | PRN
  Administered 2015-02-28: 25 mL via ORAL

## 2015-02-28 NOTE — ED Notes (Signed)
Attempted IV x2, unsuccessful. Second RN to attempt 

## 2015-02-28 NOTE — Telephone Encounter (Signed)
Pt had low bs of 67 last night

## 2015-02-28 NOTE — Discharge Instructions (Signed)
Your tests did not show the reason for your pain. Sometimes, a viral infection called shingles can cause pain like this. If you break out in a rash in that area, see your doctor or go to the ED immediately.  Flank Pain Flank pain refers to pain that is located on the side of the body between the upper abdomen and the back. The pain may occur over a short period of time (acute) or may be long-term or reoccurring (chronic). It may be mild or severe. Flank pain can be caused by many things. CAUSES  Some of the more common causes of flank pain include:  Muscle strains.   Muscle spasms.   A disease of your spine (vertebral disk disease).   A lung infection (pneumonia).   Fluid around your lungs (pulmonary edema).   A kidney infection.   Kidney stones.   A very painful skin rash caused by the chickenpox virus (shingles).   Gallbladder disease.  [place] care will depend on the cause of your pain. In general,  Rest as directed by your caregiver.  Drink enough fluids to keep your urine clear or pale yellow.  Only take over-the-counter or prescription medicines as directed by your caregiver. Some medicines may help relieve the pain.  Tell your caregiver about any changes in your pain.  Follow up with your caregiver as directed. SEEK IMMEDIATE MEDICAL CARE IF:   Your pain is not controlled with medicine.   You have new or worsening symptoms.  Your pain increases.   You have abdominal pain.   You have shortness of breath.   You have persistent nausea or vomiting.   You have swelling in your abdomen.   You feel faint or pass out.   You have blood in your urine.  You have a fever or persistent symptoms for more than 2-3 days.  You have a fever and your symptoms suddenly get worse. MAKE SURE YOU:   Understand these instructions.  Will watch your condition.  Will get help right away if you are not doing well or get worse. Document  Released: 07/30/2005 Document Revised: 03/02/2012 Document Reviewed: 01/21/2012 [facility] Patient Information 2015 [place], [place]. This information is not intended to replace advice given to you by your health care provider. Make sure you discuss any questions you have with your health care provider.  Shingles Shingles (herpes zoster) is an infection that is caused by the same virus that causes chickenpox (varicella). The infection causes a painful skin rash and fluid-filled blisters, which eventually break open, crust over, and heal. It may occur in any area of the body, but it usually affects only one side of the body or face. The pain of shingles usually lasts about 1 month. However, some people with shingles may develop long-term (chronic) pain in the affected area of the body. Shingles often occurs many years after the person had chickenpox. It is more common:  In people older than 50 years.  In people with weakened immune systems, such as those with HIV, AIDS, or cancer.  In people taking medicines that weaken the immune system, such as transplant medicines.  In people under great stress. CAUSES  Shingles is caused by the varicella zoster virus (VZV), which also causes chickenpox. After a person is infected with the virus, it can remain in the person's body for years in an inactive state (dormant). To cause shingles, the virus reactivates and breaks out as an infection in a nerve root. The  virus can be spread from person to person (contagious) through contact with open blisters of the shingles rash. It will only spread to people who have not had chickenpox. When these people are exposed to the virus, they may develop chickenpox. They will not develop shingles. Once the blisters scab over, the person is no longer contagious and cannot spread the virus to others. SIGNS AND SYMPTOMS  Shingles shows up in stages. The initial symptoms may be pain, itching, and tingling in an area of the skin. This  pain is usually described as burning, stabbing, or throbbing.In a few days or weeks, a painful red rash will appear in the area where the pain, itching, and tingling were felt. The rash is usually on one side of the body in a band or belt-like pattern. Then, the rash usually turns into fluid-filled blisters. They will scab over and dry up in approximately 2-3 weeks. Flu-like symptoms may also occur with the initial symptoms, the rash, or the blisters. These may include:  Fever.  Chills.  Headache.  Upset stomach. DIAGNOSIS  Your health care provider will perform a skin exam to diagnose shingles. Skin scrapings or fluid samples may also be taken from the blisters. This sample will be examined under a microscope or sent to a lab for further testing. TREATMENT  There is no specific cure for shingles. Your health care provider will likely prescribe medicines to help you manage the pain, recover faster, and avoid long-term problems. This may include antiviral drugs, anti-inflammatory drugs, and pain medicines. HOME CARE INSTRUCTIONS   Take a cool bath or apply cool compresses to the area of the rash or blisters as directed. This may help with the pain and itching.   Take medicines only as directed by your health care provider.   Rest as directed by your health care provider.  Keep your rash and blisters clean with mild soap and cool water or as directed by your health care provider.  Do not pick your blisters or scratch your rash. Apply an anti-itch cream or numbing creams to the affected area as directed by your health care provider.  Keep your shingles rash covered with a loose bandage (dressing).  Avoid skin contact with:  Babies.   Pregnant women.   Children with eczema.   Elderly people with transplants.   People with chronic illnesses, such as leukemia or AIDS.   Wear loose-fitting clothing to help ease the pain of material rubbing against the rash.  Keep all  follow-up visits as directed by your health care provider.If the area involved is on your face, you may receive a referral for a specialist, such as an eye doctor (ophthalmologist) or an ear, nose, and throat (ENT) doctor. Keeping all follow-up visits will help you avoid eye problems, chronic pain, or disability.  SEEK IMMEDIATE MEDICAL CARE IF:   You have facial pain, pain around the eye area, or loss of feeling on one side of your face.  You have ear pain or ringing in your ear.  You have loss of taste.  Your pain is not relieved with prescribed medicines.   Your redness or swelling spreads.   You have more pain and swelling.  Your condition is worsening or has changed.   You have a fever. MAKE SURE YOU:  Understand these instructions.  Will watch your condition.  Will get help right away if you are not doing well or get worse. Document Released: 06/08/2005 Document Revised: 10/23/2013 Document Reviewed: 01/21/2012 [employer] Patient  Information 2015 [place], [place]. This information is not intended to replace advice given to you by your health care provider. Make sure you discuss any questions you have with your health care provider.  Acetaminophen; Oxycodone tablets What is this medicine? ACETAMINOPHEN; OXYCODONE (a set a MEE noe fen; ox i KOE done) is a pain reliever. It is used to treat mild to moderate pain. This medicine may be used for other purposes; ask your health care provider or pharmacist if you have questions. COMMON BRAND NAME(S): Endocet, Magnacet, Narvox, Percocet, Perloxx, Primalev, Primlev, Roxicet, Xolox What should I tell my health care provider before I take this medicine? They need to know if you have any of these conditions: -brain tumor -Crohn's disease, inflammatory bowel disease, or ulcerative colitis -drug abuse or addiction -head injury -heart or circulation problems -if you often drink alcohol -kidney disease or problems going to the  bathroom -liver disease -lung disease, asthma, or breathing problems -an unusual or allergic reaction to acetaminophen, oxycodone, other opioid analgesics, other medicines, foods, dyes, or preservatives -pregnant or trying to get pregnant -breast-feeding How should I use this medicine? Take this medicine by mouth with a full glass of water. Follow the directions on the prescription label. Take your medicine at regular intervals. Do not take your medicine more often than directed. Talk to your pediatrician regarding the use of this medicine in children. Special care may be needed. Patients over 10 years old may have a stronger reaction and need a smaller dose. Overdosage: If you think you have taken too much of this medicine contact a poison control center or emergency room at once. NOTE: This medicine is only for you. Do not share this medicine with others. What if I miss a dose? If you miss a dose, take it as soon as you can. If it is almost time for your next dose, take only that dose. Do not take double or extra doses. What may interact with this medicine? -alcohol -antihistamines -barbiturates like amobarbital, butalbital, butabarbital, methohexital, pentobarbital, phenobarbital, thiopental, and secobarbital -benztropine -drugs for bladder problems like solifenacin, trospium, oxybutynin, tolterodine, hyoscyamine, and methscopolamine -drugs for breathing problems like ipratropium and tiotropium -drugs for certain stomach or intestine problems like propantheline, homatropine methylbromide, glycopyrrolate, atropine, belladonna, and dicyclomine -general anesthetics like etomidate, ketamine, nitrous oxide, propofol, desflurane, enflurane, halothane, isoflurane, and sevoflurane -medicines for depression, anxiety, or psychotic disturbances -medicines for sleep -muscle relaxants -naltrexone -narcotic medicines (opiates) for pain -phenothiazines like perphenazine, thioridazine, chlorpromazine,  mesoridazine, fluphenazine, prochlorperazine, promazine, and trifluoperazine -scopolamine -tramadol -trihexyphenidyl This list may not describe all possible interactions. Give your health care provider a list of all the medicines, herbs, non-prescription drugs, or dietary supplements you use. Also tell them if you smoke, drink alcohol, or use illegal drugs. Some items may interact with your medicine. What should I watch for while using this medicine? Tell your doctor or health care professional if your pain does not go away, if it gets worse, or if you have new or a different type of pain. You may develop tolerance to the medicine. Tolerance means that you will need a higher dose of the medication for pain relief. Tolerance is normal and is expected if you take this medicine for a long time. Do not suddenly stop taking your medicine because you may develop a severe reaction. Your body becomes used to the medicine. This does NOT mean you are addicted. Addiction is a behavior related to getting and using a drug for a non-medical reason. If  you have pain, you have a medical reason to take pain medicine. Your doctor will tell you how much medicine to take. If your doctor wants you to stop the medicine, the dose will be slowly lowered over time to avoid any side effects. You may get drowsy or dizzy. Do not drive, use machinery, or do anything that needs mental alertness until you know how this medicine affects you. Do not stand or sit up quickly, especially if you are an older patient. This reduces the risk of dizzy or fainting spells. Alcohol may interfere with the effect of this medicine. Avoid alcoholic drinks. There are different types of narcotic medicines (opiates) for pain. If you take more than one type at the same time, you may have more side effects. Give your health care provider a list of all medicines you use. Your doctor will tell you how much medicine to take. Do not take more medicine than  directed. Call emergency for help if you have problems breathing. The medicine will cause constipation. Try to have a bowel movement at least every 2 to 3 days. If you do not have a bowel movement for 3 days, call your doctor or health care professional. Do not take Tylenol (acetaminophen) or medicines that have acetaminophen with this medicine. Too much acetaminophen can be very dangerous. Many nonprescription medicines contain acetaminophen. Always read the labels carefully to avoid taking more acetaminophen. What side effects may I notice from receiving this medicine? Side effects that you should report to your doctor or health care professional as soon as possible: -allergic reactions like skin rash, itching or hives, swelling of the face, lips, or tongue -breathing difficulties, wheezing -confusion -light headedness or fainting spells -severe stomach pain -unusually weak or tired -yellowing of the skin or the whites of the eyes Side effects that usually do not require medical attention (report to your doctor or health care professional if they continue or are bothersome): -dizziness -drowsiness -nausea -vomiting This list may not describe all possible side effects. Call your doctor for medical advice about side effects. You may report side effects to FDA at 1-800-FDA-1088. Where should I keep my medicine? Keep out of the reach of children. This medicine can be abused. Keep your medicine in a safe place to protect it from theft. Do not share this medicine with anyone. Selling or giving away this medicine is dangerous and against the law. Store at room temperature between 20 and 25 degrees C (68 and 77 degrees F). Keep container tightly closed. Protect from light. This medicine may cause accidental overdose and death if it is taken by other adults, children, or pets. Flush any unused medicine down the toilet to reduce the chance of harm. Do not use the medicine after the expiration  date. NOTE: This sheet is a summary. It may not cover all possible information. If you have questions about this medicine, talk to your doctor, pharmacist, or health care provider.  2015, [employer]. (2013-01-30 13:17:35)

## 2015-02-28 NOTE — Telephone Encounter (Signed)
Left voicemail advising of note below. Requested call back if the pt would like to discuss.  

## 2015-02-28 NOTE — ED Notes (Signed)
Pt requesting pain medication. MD made aware. 

## 2015-02-28 NOTE — Telephone Encounter (Signed)
See note below

## 2015-02-28 NOTE — Telephone Encounter (Signed)
Please verify current insulin is 170 units qam. Then decrease to 160 units qam

## 2015-02-28 NOTE — ED Provider Notes (Signed)
CSN: [id number]     Arrival date & time 02/27/15  2106 History  This chart was scribed for Christy Fuel, MD by Ludger Nutting, ED Scribe. This patient was seen in room A05C/A05C and the patient's care was started 1:46 AM.     Chief Complaint  Patient presents with  . Hip Pain  . Back Pain   The history is provided by the patient. No language interpreter was used.     HPI Comments: Christy May is a 64 y.o. female who presents to the Emergency Department complaining of 2 days of constant, unchanged right flank and right abdominal pain. She describes her pain as sharp and rates it as 10/10 currently. She states the pain is worse with certain movements and eased by rest. She has not taken any OTC medications for her symptoms. She denies known injuries or falls. She denies nausea, vomiting, constipation, diarrhea, dysuria, SOB, CP.    PCP Cathlean Cower  Past Medical History  Diagnosis Date  . Anemia, unspecified   . Dehydration   . Depressive disorder, not elsewhere classified   . Esophageal reflux   . Unspecified essential hypertension   . Unspecified menopausal and postmenopausal disorder   . Obesity, unspecified   . Inflammatory and toxic neuropathy, unspecified   . Unspecified vitamin D deficiency   . Dysphagia 2007    historyof dysphagia with severe dysmotility by barium swallow-[name]  . Obesity   . Hypertension   . Allergic bronchitis   . Complication of anesthesia     "I've had recall; I probably stopped breathing at least 2 times" (09/03/2014)  . Family history of adverse reaction to anesthesia     "my brother was told they lost him a couple times during OR"  . OSA (obstructive sleep apnea)     "stopped using my CPAP; just couldn't use it cause it kept me awake and I couldn't keep it on my face'"(09/03/2014)  . Heart murmur   . Chronic airway obstruction, not elsewhere classified     "I was told I don't have this/tests done @ [facility] 05/2014"  . Extrinsic asthma,  unspecified     "I was told I don't have this/tests done @ [facility] 05/2014"  . Type II diabetes mellitus   . History of hiatal hernia   . Headache     "weekly to monthly" (09/03/2014)  . Migraine     "couple times/month right now" (09/03/2014)  . Spondylosis of unspecified site without mention of myelopathy   . Arthritis     "neck, back, hands" (09/03/2014)  . Diabetic peripheral neuropathy   . Chronic back pain   . Allergy   . Anxiety    Past Surgical History  Procedure Laterality Date  . Refractive surgery Bilateral   . Anterior cervical decomp/discectomy fusion      multiple cervical spine levels;Dr. Arnoldo Morale  . Incision and drainage abscess      Chest  . Dilation and curettage of uterus    . Exploratory laparotomy    . Total knee arthroplasty Left 10/2003  . Total knee arthroplasty Right 01/2004    [name]  . Foot surgery Right X 2    "took blood out of my arms and put platelets in my feet"  . Appendectomy    . Tonsillectomy    . Esophagogastroduodenoscopy N/A 05/08/2014    Procedure: ESOPHAGOGASTRODUODENOSCOPY (EGD);  Surgeon: [name], MD;  Location: [name];  Service: Endoscopy;  Laterality: N/A;  . Colonoscopy  N/A 05/08/2014    Procedure: COLONOSCOPY;  Surgeon: [name], MD;  Location: [facility];  Service: Endoscopy;  Laterality: N/A;  . Back surgery    . Cholecystectomy open    . Abdominal hysterectomy      "partial"  . Eye surgery    . Joint replacement     Family History  Problem Relation Age of Onset  . Esophageal cancer Brother   . Esophageal cancer Sister     ?  . Colon polyps Brother   . Pancreatic cancer Sister     ?  . Diabetes Sister   . Diabetes      Aunt and Uncle  . Heart disease Maternal Grandfather    Social History  Substance Use Topics  . Smoking status: Former Smoker -- 0.00 packs/day for 0 years    Types: Cigarettes    Quit date: 06/22/1986  . Smokeless tobacco: Never Used  . Alcohol Use: Yes   OB History    No  data available     Review of Systems  Respiratory: Negative for shortness of breath.   Cardiovascular: Negative for chest pain.  Gastrointestinal: Positive for abdominal pain (right sided). Negative for nausea, vomiting, diarrhea and constipation.  Genitourinary: Positive for flank pain (right). Negative for dysuria.  All other systems reviewed and are negative.     Allergies  Ace inhibitors; Penicillins; Tape; and Adhesive  Home Medications   Prior to Admission medications   Medication Sig Start Date End Date Taking? Authorizing Provider  albuterol (PROVENTIL) (2.5 MG/3ML) 0.083% nebulizer solution Take 2.5 mg by nebulization every 6 (six) hours as needed for wheezing or shortness of breath.    Historical Provider, MD  aspirin 81 MG tablet Take 81 mg by mouth at bedtime.     Historical Provider, MD  cholecalciferol (VITAMIN D) 1000 UNITS tablet Take 2,000 Units by mouth every morning.     Historical Provider, MD  diclofenac sodium (VOLTAREN) 1 % GEL Apply 4 g topically 4 (four) times daily as needed (right hand, arm and neck pain.).    Historical Provider, MD  famotidine (PEPCID) 20 MG tablet TAKE 1 TABLET BY MOUTH EVERY NIGHT AT BEDTIME 09/21/14   Collene Gobble, MD  Fish Oil OIL by Does not apply route.    Historical Provider, MD  fluticasone (FLONASE) 50 MCG/ACT nasal spray Place 2 sprays into both nostrils every morning. 04/26/14   Collene Gobble, MD  folic acid (FOLVITE) 1 MG tablet Take 4 tablets (4 mg total) by mouth daily. 02/20/15   Renato Shin, MD  gabapentin (NEURONTIN) 800 MG tablet Take 1 tablet (800 mg total) by mouth 3 (three) times daily. 02/27/15   Biagio Borg, MD  glucosamine-chondroitin 500-400 MG tablet Take 1 tablet by mouth 3 (three) times daily.    Historical Provider, MD  HYDROcodone-acetaminophen (NORCO/VICODIN) 5-325 MG per tablet Take 1 tablet by mouth every 8 (eight) hours as needed. 02/08/15   Biagio Borg, MD  insulin NPH Human (HUMULIN N) 100 UNIT/ML  injection Inject 1.9 mLs (190 Units total) into the skin every morning. And syringes 2/day 02/19/15   Renato Shin, MD  Insulin Syringe-Needle U-100 (INSULIN SYRINGE 1CC/30GX5/16") 30G X 5/16" 1 ML MISC Use to inject insulin 2 times per day. 02/18/15   Renato Shin, MD  irbesartan (AVAPRO) 300 MG tablet Take 1 tablet (300 mg total) by mouth daily. 02/18/15   Biagio Borg, MD  Lancets [facility] ULTRASOFT) lancets Use to test blood  sugars twice a day. 07/20/13   Renato Shin, MD  loratadine (CLARITIN) 10 MG tablet TAKE 1 TABLET BY MOUTH EVERY DAY 01/02/15   Collene Gobble, MD  metoprolol (LOPRESSOR) 50 MG tablet Take 1.5 tablets (75 mg total) by mouth 2 (two) times daily. 12/17/14   Biagio Borg, MD  montelukast (SINGULAIR) 10 MG tablet Take 10 mg by mouth at bedtime.    Historical Provider, MD  nystatin cream (MYCOSTATIN) APPLY TO AFFECTED AREA TWICE DAILY 11/21/14   Biagio Borg, MD  omeprazole (PRILOSEC) 40 MG capsule Take 1 capsule (40 mg total) by mouth daily. 10/30/14   Collene Gobble, MD  ondansetron (ZOFRAN ODT) 4 MG disintegrating tablet Take 1 tablet (4 mg total) by mouth every 8 (eight) hours as needed for nausea or vomiting. 01/03/15   Lyndal Pulley, DO  ondansetron (ZOFRAN) 4 MG tablet Take 1 tablet (4 mg total) by mouth every 8 (eight) hours as needed for nausea or vomiting. 02/08/15   Biagio Borg, MD  ONE TOUCH ULTRA TEST test strip USE TO TEST BLOOD SUGARS TWICE DAILY 09/21/14   Renato Shin, MD  oxybutynin (DITROPAN-XL) 10 MG 24 hr tablet Take 1 tablet (10 mg total) by mouth daily. 02/18/15   Biagio Borg, MD  predniSONE (DELTASONE) 10 MG tablet Take 40mg  po daily for 3 days, then take 30mg  po daily for 3 days, then take 20mg  po daily for two days, then take 10mg  po daily for 2 days 02/13/15   Juanito Doom, MD  promethazine (PHENERGAN) 25 MG tablet TAKE 1 TABLET(25 MG) BY MOUTH EVERY 6 HOURS AS NEEDED FOR NAUSEA OR VOMITING 01/30/15   Biagio Borg, MD  traMADol (ULTRAM) 50 MG tablet TAKE 1  TABLET BY MOUTH EVERY 6 HOURS AS NEEDED 02/26/15   Biagio Borg, MD   BP 116/61 mmHg  Pulse 90  Temp(Src) 97.6 F (36.4 C) (Oral)  Resp 16  Ht 5\' 4"  (1.626 m)  Wt 301 lb 6.4 oz (136.714 kg)  BMI 51.71 kg/m2  SpO2 97% Physical Exam  Constitutional: She is oriented to person, place, and time. She appears well-developed and well-nourished.  HENT:  Head: Normocephalic and atraumatic.  Mouth/Throat: Oropharynx is clear and moist.  Eyes: Conjunctivae and EOM are normal. Pupils are equal, round, and reactive to light.  Neck: Normal range of motion. Neck supple. No JVD present.  Cardiovascular: Normal rate, regular rhythm and normal heart sounds.   No murmur heard. Pulmonary/Chest: Effort normal and breath sounds normal. She has no wheezes. She has no rales. She exhibits no tenderness.  Abdominal: Soft. She exhibits no distension and no mass. Bowel sounds are decreased. There is no tenderness.  Musculoskeletal: She exhibits tenderness. She exhibits no edema.  Moderate tenderness at right costovertebral angle.   Lymphadenopathy:    She has no cervical adenopathy.  Neurological: She is alert and oriented to person, place, and time. No cranial nerve deficit. She exhibits normal muscle tone. Coordination normal.  Skin: Skin is warm and dry. No rash noted.  Psychiatric: She has a normal mood and affect. Her behavior is normal. Judgment and thought content normal.  Nursing note and vitals reviewed.   ED Course  Procedures (including critical care time)  DIAGNOSTIC STUDIES: Oxygen Saturation is 97% on RA, adequate by my interpretation.    COORDINATION OF CARE: 1:51 AM Discussed treatment plan with pt at bedside and pt agreed to plan.   Labs Review Results for orders placed  or performed during the hospital encounter of 02/28/15  Urinalysis, Routine w reflex microscopic (not at [facility])  Result Value Ref Range   Color, Urine YELLOW YELLOW   APPearance CLEAR CLEAR   Specific Gravity, Urine  1.013 1.005 - 1.030   pH 5.5 5.0 - 8.0   Glucose, UA NEGATIVE NEGATIVE mg/dL   Hgb urine dipstick NEGATIVE NEGATIVE   Bilirubin Urine NEGATIVE NEGATIVE   Ketones, ur NEGATIVE NEGATIVE mg/dL   Protein, ur NEGATIVE NEGATIVE mg/dL   Urobilinogen, UA 0.2 0.0 - 1.0 mg/dL   Nitrite NEGATIVE NEGATIVE   Leukocytes, UA NEGATIVE NEGATIVE  Comprehensive metabolic panel  Result Value Ref Range   Sodium 136 135 - 145 mmol/L   Potassium 4.7 3.5 - 5.1 mmol/L   Chloride 100 (L) 101 - 111 mmol/L   CO2 28 22 - 32 mmol/L   Glucose, Bld 85 65 - 99 mg/dL   BUN 28 (H) 6 - 20 mg/dL   Creatinine, Ser 1.30 (H) 0.44 - 1.00 mg/dL   Calcium 9.3 8.9 - 10.3 mg/dL   Total Protein 6.7 6.5 - 8.1 g/dL   Albumin 3.3 (L) 3.5 - 5.0 g/dL   AST 21 15 - 41 U/L   ALT 27 14 - 54 U/L   Alkaline Phosphatase 64 38 - 126 U/L   Total Bilirubin 0.3 0.3 - 1.2 mg/dL   GFR calc non Af Amer 42 (L) >60 mL/min   GFR calc Af Amer 49 (L) >60 mL/min   Anion gap 8 5 - 15  CBC with Differential  Result Value Ref Range   WBC 8.2 4.0 - 10.5 K/uL   RBC 4.34 3.87 - 5.11 MIL/uL   Hemoglobin 11.5 (L) 12.0 - 15.0 g/dL   HCT 37.3 36.0 - 46.0 %   MCV 85.9 78.0 - 100.0 fL   MCH 26.5 26.0 - 34.0 pg   MCHC 30.8 30.0 - 36.0 g/dL   RDW 16.2 (H) 11.5 - 15.5 %   Platelets 191 150 - 400 K/uL   Neutrophils Relative % 52 43 - 77 %   Neutro Abs 4.3 1.7 - 7.7 K/uL   Lymphocytes Relative 39 12 - 46 %   Lymphs Abs 3.2 0.7 - 4.0 K/uL   Monocytes Relative 7 3 - 12 %   Monocytes Absolute 0.6 0.1 - 1.0 K/uL   Eosinophils Relative 2 0 - 5 %   Eosinophils Absolute 0.2 0.0 - 0.7 K/uL   Basophils Relative 0 0 - 1 %   Basophils Absolute 0.0 0.0 - 0.1 K/uL  Lipase, blood  Result Value Ref Range   Lipase 19 (L) 22 - 51 U/L  CBG monitoring, ED  Result Value Ref Range   Glucose-Capillary 67 65 - 99 mg/dL   Comment 1 Notify RN    Imaging Review Ct Abdomen Pelvis W Contrast  02/28/2015   CLINICAL DATA:  Constant right flank pain and right abdominal pain  over 2 days.  EXAM: CT ABDOMEN AND PELVIS WITH CONTRAST  TECHNIQUE: Multidetector CT imaging of the abdomen and pelvis was performed using the standard protocol following bolus administration of intravenous contrast.  CONTRAST:  129mL OMNIPAQUE IOHEXOL 300 MG/ML  SOLN  COMPARISON:  08/30/2014  FINDINGS: Lung bases are clear.  Gallbladder appears to be surgically absent. No bile duct dilatation allowing for postoperative physiology. Liver, spleen, pancreas, adrenal glands, abdominal aorta, inferior vena cava, and retroperitoneal lymph nodes are unremarkable. Multiple sub cm low-attenuation lesions throughout the kidneys possibly representing small cysts. No  hydronephrosis. Nephrograms are symmetrical. The stomach, small bowel, and colon are not abnormally distended. Contrast material flows through to the colon without evidence of bowel obstruction. Diffusely stool-filled colon. No free air or free fluid in the abdomen.  Pelvis: Appendix is surgically absent. Uterus is surgically absent. No pelvic mass or lymphadenopathy. Bladder wall is not thickened. No free or loculated pelvic fluid collections. No evidence of diverticulitis. Degenerative changes in the spine and hips. No destructive bone lesions.  IMPRESSION: No acute process demonstrated in the abdomen or pelvis. No evidence of bowel obstruction or inflammation. Diffusely stool-filled colon. No significant change since prior study.   Electronically Signed   By: Lucienne Capers M.D.   On: 02/28/2015 05:45   I have personally reviewed and evaluated these images and lab results as part of my medical decision-making.  MDM   Final diagnoses:  Right flank pain  Renal insufficiency  Normochromic normocytic anemia    Abdominal pain and flank pain of uncertain cause. She is sent for CT scan which has come back unremarkable. Lab work is also unremarkable except for chronic renal insufficiency which is unchanged from baseline. At this point, I suspect that she  may have herpes zoster and the rashes has not had a chance to come up. Patient is advised to watch for rash and she is discharged with prescription for oxycodone-acetaminophen for pain.  I personally performed the services described in this documentation, which was scribed in my presence. The recorded information has been reviewed and is accurate.     Christy Fuel, MD 42/10/31 2811

## 2015-03-01 ENCOUNTER — Other Ambulatory Visit: Payer: Self-pay | Admitting: Internal Medicine

## 2015-03-04 ENCOUNTER — Telehealth: Payer: Self-pay | Admitting: Internal Medicine

## 2015-03-04 NOTE — Telephone Encounter (Signed)
Patient dismissed from [facility] by Cathlean Cower MD , effective February 28, 2015. Dismissal letter sent out by certified / registered mail.  DAJ  Received signed domestic return receipt verifying delivery of certified letter on March 08, 2015. Article number [phone]

## 2015-03-05 ENCOUNTER — Encounter: Payer: Self-pay | Admitting: Emergency Medicine

## 2015-03-05 ENCOUNTER — Encounter: Payer: Self-pay | Admitting: Internal Medicine

## 2015-03-05 ENCOUNTER — Ambulatory Visit (INDEPENDENT_AMBULATORY_CARE_PROVIDER_SITE_OTHER): Payer: Medicare Other | Admitting: Emergency Medicine

## 2015-03-05 ENCOUNTER — Ambulatory Visit (INDEPENDENT_AMBULATORY_CARE_PROVIDER_SITE_OTHER): Payer: Medicare Other | Admitting: Internal Medicine

## 2015-03-05 VITALS — BP 126/84 | HR 76 | Temp 97.9°F | Ht 64.0 in | Wt 304.0 lb

## 2015-03-05 VITALS — BP 124/66 | HR 75 | Wt 308.0 lb

## 2015-03-05 DIAGNOSIS — H9190 Unspecified hearing loss, unspecified ear: Secondary | ICD-10-CM

## 2015-03-05 DIAGNOSIS — H9193 Unspecified hearing loss, bilateral: Secondary | ICD-10-CM | POA: Diagnosis not present

## 2015-03-05 DIAGNOSIS — J309 Allergic rhinitis, unspecified: Secondary | ICD-10-CM | POA: Diagnosis not present

## 2015-03-05 DIAGNOSIS — M545 Low back pain, unspecified: Secondary | ICD-10-CM

## 2015-03-05 DIAGNOSIS — K5900 Constipation, unspecified: Secondary | ICD-10-CM | POA: Diagnosis not present

## 2015-03-05 DIAGNOSIS — G4730 Sleep apnea, unspecified: Secondary | ICD-10-CM

## 2015-03-05 DIAGNOSIS — I10 Essential (primary) hypertension: Secondary | ICD-10-CM | POA: Diagnosis not present

## 2015-03-05 DIAGNOSIS — J383 Other diseases of vocal cords: Secondary | ICD-10-CM

## 2015-03-05 MED ORDER — POLYETHYLENE GLYCOL 3350 17 GM/SCOOP PO POWD: 17.0000 g | Freq: Every day | ORAL | Status: DC

## 2015-03-05 NOTE — Assessment & Plan Note (Signed)
Continue aggressive management of her allergic rhinitis, GERD, sleep apnea as all of these will impact her upper airway irritation syndrome

## 2015-03-05 NOTE — Assessment & Plan Note (Signed)
I will refer her to ENT for further evaluation for hearing loss

## 2015-03-05 NOTE — Patient Instructions (Addendum)
Please continue to use your CPAP mask every night. Try your best to wear it all night.  Stay on singulair, loratadine, pepcid, fluticasone as you have been taking them.  Take albuterol 2 puffs up to every 4 hours if needed for shortness of breath.  You should get your flu shot this Fall  We will refer you to see ENT for your hearing loss Follow with Dr Lamonte Sakai in 3 months or sooner if you have any problems.

## 2015-03-05 NOTE — Assessment & Plan Note (Signed)
stable overall by history and exam, recent data reviewed with pt, and pt to continue medical treatment as before,  to f/u any worsening symptoms or concerns BP Readings from Last 3 Encounters:  03/05/15 126/84  03/05/15 124/66  02/28/15 114/64

## 2015-03-05 NOTE — Assessment & Plan Note (Signed)
Stay on singulair, loratadine, pepcid, fluticasone as you have been taking them.  Take albuterol 2 puffs up to every 4 hours if needed for shortness of breath.  You should get your flu shot this Fall

## 2015-03-05 NOTE — Assessment & Plan Note (Signed)
Please continue to use your CPAP mask every night. Try your best to wear it all night.

## 2015-03-05 NOTE — Assessment & Plan Note (Signed)
Ok for [employer] prn,  to f/u any worsening symptoms or concerns

## 2015-03-05 NOTE — Progress Notes (Signed)
Subjective:    Patient ID: Christy May, female    DOB: Oct 27, 1950, 64 y.o.   MRN: [id number]  HPI 64 yo woman, former smoker with obesity, OSA, HTN on irbesartan, DM, childhood asthma. Carries a dx of adult asthma made several years ago. Referred for cough. She describes cough that has been prod of white phlegm, started after URI sx about 4 months ago. Describes a tickle in her throat. Can result in stress incontinence.   She has been on Advair in the past. Was recently changed to QVAR + albuterol, uses about 2-3 x a month. She is on omeprazole 20mg  twice a day. She still has occasional reflux sx > can reflux all the way up to throat. She has allergy and rhinitis sx frequently - singulair has been added recently and has helped her.   CXR 12/22/13 - normal.  We will perform full pulmonary function testing  We will change your omeprazole to nexium 40mg  twice a day until our next visit Continue your singulair Stop QVAR for now Keep albuterol available to use 2 puffs if needed for shortness of breath Start loratadine (Claritin) 10mg  daily Start fluticasone nasal spray, 2 sprays each nostril daily Follow with Dr Lamonte Sakai in 1 month  Acute OV 02/01/14 --  Returns for persistent cough. Seen 1 month ago. Seen last month for pulmonary consult for cough.  She is quite upset that her cough is no better and she can not go anywhere due to stress incontinence w/ coughing fits. Has been going on for ~5 months  On Lopressor 75mg  Twice daily   CXR w/ no acute process 12/22/13 .  Former smoker, PFT pending.  Last ov was changed from prilosec to nexium for better GERD control. She continues to have intermittent reflux.  QVAR was held due to no improvement in symptoms. Previous on Advair in past without help.  No fever, orthopnea, edema , n/v/d, discolored mucus. Does have some sinus drainage.  Has used tussionex with some help, but is out of this.   ROV 02/14/14 -- follows for cough, GERD, PND, suspected  asthma. I changed omeprazole to nexium, added loratdine, flonase. TP added pepcid, chlorpheniramine. Her cough has gotten better but has not gone away completely. She is still having. She has been dx w sleep apnea - she has not been able to tolerate CPAP.   PFT done today 02/14/14 > shows some spiro evidence for restriction but for the most part normal. Could not do volumes.    ROV 03/13/14 -- follow up for chronic cough, influenced by GERD and rhinitis. Her PFT are reassuring > little evidence for asthma. Treated recently by Dr Jenny Reichmann for a possible bronchitis and flare. Temporarily improved but then cough right back to where she started.  She is on high dose nexium, flonase, singulair, pepcid qhs.  She is on loratadine qd, not on chlorpheniramine. She is having some nasal congestion. She still has some mild GERD sx.   ROV 08/09/14 -- follow-up visit for chronic cough. She has a history of GERD and allergic rhinitis. We have not been able to establish a diagnosis of asthma based on her spirometry.She had been feeling well, fairly good cough control. Has been going to the speech therapists at [facility] with benefit. She is on singulair, pepcid, Nexium, neurontin, fluticasone, loratadine. She asks today about OSA and CPAP.   ROV 10/30/14 -- follow-up visit for chronic cough, upper airway disease, GERD allergic rhinitis. She has benefited from speech therapy, loratadine,  Singulair, Pepcid, omeprazole,  Neurontin. She has been dx with OSA and we started her on auto-set CPAP since her last visit.  She tells me today that she doesn't have the CPAP yet.  She tells me that she has been having more trouble with her cough since last several weeks. She is coughing up some beige mucous.    ROV 03/05/15 -- chronic cough, upper airway disease, GERD allergic rhinitis. She follows today after her device download > reviewed this today, shows that she has worn it some, 11 / 30 days (37% of the days). She is having trouble  tolerating the mask, but does feel that it helps her, makes her feel better the next day. She just had an exacerbation of her cough in the setting of a URI. Her voice is scratchy for the last several days. Her cough is stable since she completed a pred taper.    Review of Systems As per HPI     Objective:   Physical Exam Filed Vitals:   03/05/15 1428  BP: 124/66  Pulse: 75  Weight: 308 lb (139.708 kg)  SpO2: 93%    GEN: A/Ox3; pleasant , NAD, obese   HEENT:  OP clear, hoarse voice.   NECK:  No stridor  RESP  Clear bilaterally  CARD:  RRR, no m/r/g, trace edema.   Musco: Warm bil, no deformities or joint swelling noted.   Neuro: alert, no focal deficits noted.    Skin: Warm, no lesions or rashes      Assessment & Plan:  Sleep apnea Please continue to use your CPAP mask every night. Try your best to wear it all night.    Allergic rhinitis Stay on singulair, loratadine, pepcid, fluticasone as you have been taking them.  Take albuterol 2 puffs up to every 4 hours if needed for shortness of breath.  You should get your flu shot this Fall   Hearing loss I will refer her to ENT for further evaluation for hearing loss  Vocal cord dysfunction Continue aggressive management of her allergic rhinitis, GERD, sleep apnea as all of these will impact her upper airway irritation syndrome

## 2015-03-05 NOTE — Assessment & Plan Note (Signed)
Ok for cont'd tramadol - for ortho referral

## 2015-03-05 NOTE — Patient Instructions (Signed)
Please take all new medication as prescribed - the miralax daily  Please continue all other medications as before, and refills have been done if requested.  Please have the pharmacy call with any other refills you may need.  Please continue your efforts at being more active, low cholesterol diet, and weight control.  Please keep your appointments with your specialists as you may have planned  You will be contacted regarding the referral for: orthopedic

## 2015-03-05 NOTE — Progress Notes (Signed)
Pre visit review using our clinic review tool, if applicable. No additional management support is needed unless otherwise documented below in the visit note. 

## 2015-03-05 NOTE — Progress Notes (Signed)
Subjective:    Patient ID: Christy May, female    DOB: February 14, 1951, 64 y.o.   MRN: [id number]  HPI  Here to f/u post ED visit with abd/back pain, CT neg, labs felt unremarkable, still with significant pain. Denies worsening reflux, dysphagia, n/v, bowel change or blood. Denies urinary symptoms such as dysuria, frequency, urgency, flank pain, hematuria or n/v, fever, chills. Finished prednisone.  Never had shingles rash.  Pt continues to have recurring right flank and LBP with some radiation to right groin, but no bowel or bladder change, fever, wt loss,  worsening LE pain/numbness/weakness, gait change or falls, better now  Did have some constipation seen on CT.   Pt denies fever, wt loss, night sweats, loss of appetite, or other constitutional symptoms.  Not tried any OTC cathartic  Past Medical History  Diagnosis Date  . Anemia, unspecified   . Dehydration   . Depressive disorder, not elsewhere classified   . Esophageal reflux   . Unspecified essential hypertension   . Unspecified menopausal and postmenopausal disorder   . Obesity, unspecified   . Inflammatory and toxic neuropathy, unspecified   . Unspecified vitamin D deficiency   . Dysphagia 2007    historyof dysphagia with severe dysmotility by barium swallow-[name]  . Obesity   . Hypertension   . Allergic bronchitis   . Complication of anesthesia     "I've had recall; I probably stopped breathing at least 2 times" (09/03/2014)  . Family history of adverse reaction to anesthesia     "my brother was told they lost him a couple times during OR"  . OSA (obstructive sleep apnea)     "stopped using my CPAP; just couldn't use it cause it kept me awake and I couldn't keep it on my face'"(09/03/2014)  . Heart murmur   . Chronic airway obstruction, not elsewhere classified     "I was told I don't have this/tests done @ [facility] 05/2014"  . Extrinsic asthma, unspecified     "I was told I don't have this/tests done @ [facility]  05/2014"  . Type II diabetes mellitus   . History of hiatal hernia   . Headache     "weekly to monthly" (09/03/2014)  . Migraine     "couple times/month right now" (09/03/2014)  . Spondylosis of unspecified site without mention of myelopathy   . Arthritis     "neck, back, hands" (09/03/2014)  . Diabetic peripheral neuropathy   . Chronic back pain   . Allergy   . Anxiety    Past Surgical History  Procedure Laterality Date  . Refractive surgery Bilateral   . Anterior cervical decomp/discectomy fusion      multiple cervical spine levels;Dr. Arnoldo Morale  . Incision and drainage abscess      Chest  . Dilation and curettage of uterus    . Exploratory laparotomy    . Total knee arthroplasty Left 10/2003  . Total knee arthroplasty Right 01/2004    [name]  . Foot surgery Right X 2    "took blood out of my arms and put platelets in my feet"  . Appendectomy    . Tonsillectomy    . Esophagogastroduodenoscopy N/A 05/08/2014    Procedure: ESOPHAGOGASTRODUODENOSCOPY (EGD);  Surgeon: [name], MD;  Location: [name];  Service: Endoscopy;  Laterality: N/A;  . Colonoscopy N/A 05/08/2014    Procedure: COLONOSCOPY;  Surgeon: [name], MD;  Location: [facility];  Service: Endoscopy;  Laterality: N/A;  .  Back surgery    . Cholecystectomy open    . Abdominal hysterectomy      "partial"  . Eye surgery    . Joint replacement      reports that she quit smoking about 28 years ago. Her smoking use included Cigarettes. She smoked 0.00 packs per day for 0 years. She has never used smokeless tobacco. She reports that she drinks alcohol. She reports that she does not use illicit drugs. family history includes Colon polyps in her brother; Diabetes in her sister and another family member; Esophageal cancer in her brother and sister; Heart disease in her maternal grandfather; Pancreatic cancer in her sister. Allergies  Allergen Reactions  . Ace Inhibitors Anaphylaxis  . Penicillins Hives and  Rash  . Tape Hives    Paper tape is ok to use  . Adhesive [Tape] Rash   Review of Systems  Constitutional: Negative for unusual diaphoresis or night sweats HENT: Negative for ringing in ear or discharge Eyes: Negative for double vision or worsening visual disturbance.  Respiratory: Negative for choking and stridor.   Gastrointestinal: Negative for vomiting or other signifcant bowel change Genitourinary: Negative for hematuria or change in urine volume.  Musculoskeletal: Negative for other MSK pain or swelling Skin: Negative for color change and worsening wound.  Neurological: Negative for tremors and numbness other than noted  Psychiatric/Behavioral: Negative for decreased concentration or agitation other than above       Objective:   Physical Exam BP 126/84 mmHg  Pulse 76  Temp(Src) 97.9 F (36.6 C) (Oral)  Ht 5\' 4"  (1.626 m)  Wt 304 lb (137.893 kg)  BMI 52.16 kg/m2  SpO2 95% VS noted,  Constitutional: Pt appears in no significant distress HENT: Head: NCAT.  Right Ear: External ear normal.  Left Ear: External ear normal.  Eyes: . Pupils are equal, round, and reactive to light. Conjunctivae and EOM are normal Neck: Normal range of motion. Neck supple.  Cardiovascular: Normal rate and regular rhythm.   Pulmonary/Chest: Effort normal and breath sounds without rales or wheezing.  Abd:  Soft, NT, ND, + BS Neurological: Pt is alert. Not confused , motor grossly intact Skin: Skin is warm. No rash, no LE edema Psychiatric: Pt behavior is normal. No agitation.  Spine tender right lumbar paravertebral    Assessment & Plan:

## 2015-03-06 ENCOUNTER — Ambulatory Visit: Payer: Medicare Other | Admitting: Internal Medicine

## 2015-03-07 ENCOUNTER — Ambulatory Visit (INDEPENDENT_AMBULATORY_CARE_PROVIDER_SITE_OTHER): Payer: Medicare Other | Admitting: Family Medicine

## 2015-03-07 ENCOUNTER — Ambulatory Visit (HOSPITAL_COMMUNITY)
Admission: RE | Admit: 2015-03-07 | Discharge: 2015-03-07 | Disposition: A | Discharge status: Home / Self Care | Payer: Medicare Other | Source: Ambulatory Visit | Attending: Family Medicine | Admitting: Family Medicine

## 2015-03-07 ENCOUNTER — Encounter: Payer: Self-pay | Admitting: Family Medicine

## 2015-03-07 VITALS — BP 116/78 | HR 92 | Ht 64.0 in | Wt 306.0 lb

## 2015-03-07 DIAGNOSIS — M545 Low back pain, unspecified: Secondary | ICD-10-CM

## 2015-03-07 DIAGNOSIS — M5412 Radiculopathy, cervical region: Secondary | ICD-10-CM

## 2015-03-07 DIAGNOSIS — M5136 Other intervertebral disc degeneration, lumbar region: Secondary | ICD-10-CM | POA: Diagnosis not present

## 2015-03-07 MED ORDER — KETOROLAC TROMETHAMINE 60 MG/2ML IM SOLN
60.0000 mg | Freq: Once | INTRAMUSCULAR | Status: AC
  Administered 2015-03-07: 60 mg via INTRAMUSCULAR

## 2015-03-07 MED ORDER — METHYLPREDNISOLONE ACETATE 80 MG/ML IJ SUSP
80.0000 mg | Freq: Once | INTRAMUSCULAR | Status: AC
  Administered 2015-03-07: 80 mg via INTRAMUSCULAR

## 2015-03-07 MED ORDER — CYCLOBENZAPRINE HCL 5 MG PO TABS: 5.0000 mg | ORAL_TABLET | Freq: Three times a day (TID) | ORAL | Status: DC | PRN

## 2015-03-07 NOTE — Progress Notes (Signed)
Pre visit review using our clinic review tool, if applicable. No additional management support is needed unless otherwise documented below in the visit note. 

## 2015-03-07 NOTE — Assessment & Plan Note (Signed)
Patient was doing much better previously. Patient though unfortunately is having worsening symptoms likely secondary to her having more the back pain and not being able to concentrate on doing her exercises and working on her posture and ergonomics. Patient's will be treated more for the low back pain and then we will focus on the neck

## 2015-03-07 NOTE — Progress Notes (Signed)
[facility] [address] [address] [place], [place] [address] Phone: [phone] Subjective:     CC: Neck and back pain follow up  [id number] Christy May is a 64 y.o. female coming in with complaint of neck and back pain. Patient states that the neck pain seems to have a past medical history significant for a C3-C7 fusion. Patient unfortunately continued to have pain even after the surgery. Patient has been following up with her neurosurgeon and they have discussed the possibility of more surgery. Patient would like to avoid this.   We saw patient previously and discussed some more exercises focusing on the scapular and the upper back as well as some natural medications a could be beneficial. Patient being treated more for the underlying arthritis. Patient states that the neck pain seems to be worsening but is not the worst pain she is having right now. Patient is having more the back pain. Patient states that the radicular symptoms into her arms have been worse in the last several weeks. Patient has noticed though that she's been doing a lot more reaching and not doing the exercises regularly.   Patient is also coming in with a new problem. Patient recently saw her primary care physician and did have more of a right-sided low back pain.patient was actually seen in the emergency department and they a CT scan of the abdomen. Patient's abdomen show no significant findings except for constipation. This was reviewed by me. Patient statesshe has been taking MiraLAX and may be having better bowel movements but continues to have the right sided back pain. Some radicular symptoms seems to be going down the posterior aspect of the leg. Patient denies any weakness or giving out on her. Patient does not know what is potentially causing him. Seems to be worse with certain movements and his stopping her from daily activities. Has will correct at night. Rates the severity of pain a 7  out of 10.  Past Medical History  Diagnosis Date  . Anemia, unspecified   . Dehydration   . Depressive disorder, not elsewhere classified   . Esophageal reflux   . Unspecified essential hypertension   . Unspecified menopausal and postmenopausal disorder   . Obesity, unspecified   . Inflammatory and toxic neuropathy, unspecified   . Unspecified vitamin D deficiency   . Dysphagia 2007    historyof dysphagia with severe dysmotility by barium swallow-[name]  . Obesity   . Hypertension   . Allergic bronchitis   . Complication of anesthesia     "I've had recall; I probably stopped breathing at least 2 times" (09/03/2014)  . Family history of adverse reaction to anesthesia     "my brother was told they lost him a couple times during OR"  . OSA (obstructive sleep apnea)     "stopped using my CPAP; just couldn't use it cause it kept me awake and I couldn't keep it on my face'"(09/03/2014)  . Heart murmur   . Chronic airway obstruction, not elsewhere classified     "I was told I don't have this/tests done @ [facility] 05/2014"  . Extrinsic asthma, unspecified     "I was told I don't have this/tests done @ [facility] 05/2014"  . Type II diabetes mellitus   . History of hiatal hernia   . Headache     "weekly to monthly" (09/03/2014)  . Migraine     "couple times/month right now" (09/03/2014)  . Spondylosis of unspecified site without  mention of myelopathy   . Arthritis     "neck, back, hands" (09/03/2014)  . Diabetic peripheral neuropathy   . Chronic back pain   . Allergy   . Anxiety    Past Surgical History  Procedure Laterality Date  . Refractive surgery Bilateral   . Anterior cervical decomp/discectomy fusion      multiple cervical spine levels;Dr. Arnoldo Morale  . Incision and drainage abscess      Chest  . Dilation and curettage of uterus    . Exploratory laparotomy    . Total knee arthroplasty Left 10/2003  . Total knee arthroplasty Right 01/2004    [name]  . Foot surgery Right  X 2    "took blood out of my arms and put platelets in my feet"  . Appendectomy    . Tonsillectomy    . Esophagogastroduodenoscopy N/A 05/08/2014    Procedure: ESOPHAGOGASTRODUODENOSCOPY (EGD);  Surgeon: [name], MD;  Location: [name];  Service: Endoscopy;  Laterality: N/A;  . Colonoscopy N/A 05/08/2014    Procedure: COLONOSCOPY;  Surgeon: [name], MD;  Location: [facility];  Service: Endoscopy;  Laterality: N/A;  . Back surgery    . Cholecystectomy open    . Abdominal hysterectomy      "partial"  . Eye surgery    . Joint replacement     Social History  Substance Use Topics  . Smoking status: Former Smoker -- 0.00 packs/day for 0 years    Types: Cigarettes    Quit date: 06/22/1986  . Smokeless tobacco: Never Used  . Alcohol Use: Yes   Family History  Problem Relation Age of Onset  . Esophageal cancer Brother   . Esophageal cancer Sister     ?  . Colon polyps Brother   . Pancreatic cancer Sister     ?  . Diabetes Sister   . Diabetes      Aunt and Uncle  . Heart disease Maternal Grandfather    Allergies  Allergen Reactions  . Ace Inhibitors Anaphylaxis  . Penicillins Hives and Rash  . Tape Hives    Paper tape is ok to use  . Adhesive [Tape] Rash        Past medical history, social, surgical and family history all reviewed in electronic medical record.   Review of Systems: No headache, visual changes, nausea, vomiting, diarrhea, constipation, dizziness, abdominal pain, skin rash, fevers, chills, night sweats, weight loss, swollen lymph nodes, body aches, joint swelling, muscle aches, chest pain, shortness of breath, mood changes.   Objective Blood pressure 116/78, pulse 92, height 5\' 4"  (1.626 m), weight 306 lb (138.801 kg), SpO2 95 %.  General: No apparent distress alert and oriented x3 mood and affect normal, dressed appropriately. Morbidly obese HEENT: Pupils equal, extraocular movements intact  Respiratory: Patient's speak in full sentences  and does not appear short of breath  Cardiovascular: No lower extremity edema, non tender, no erythema  Skin: Warm dry intact with no signs of infection or rash on extremities or on axial skeleton.  Abdomen: Soft nontender  Neuro: Cranial nerves II through XII are intact, neurovascularly intact in all extremities with 2+ DTRs and 2+ pulses.  Lymph: No lymphadenopathy of posterior or anterior cervical chain or axillae bilaterally.  Gait normal with good balance and coordination.  MSK:  Non tender with full range of motion and good stability and symmetric strength and tone of shoulders, elbows, wrist, hip, knee and ankles bilaterally. Severe osteoarthritic changes of multiple joints  Neck: Loss of lordosis secondary to previous surgeries No palpable stepoffs. Positive Spurling the right side and 5 C6 distribution Significant limitation in range of motion lacking any extension as well as side bending bilaterally. Lacking the last 5 of flexion. Grip strength and sensation normal in bilateral hands Strength good C4 to T1 distribution No sensory change to C4 to T1 Negative Hoffman sign bilaterally Reflexes normal Mild impingement signs of the shoulder  Back Exam:  Inspection: Unremarkable  Motion: Flexion 25 deg, Extension 15 deg, Side Bending to 25 deg bilaterally,  Rotation to 25 deg bilaterally  SLR laying: Positive right side XSLR laying: Negative  Palpable tenderness: Severely tender in the [facility] bowel musculature as well as the piriformis area on the right side FABER: negative. Sensory change: Gross sensation intact to all lumbar and sacral dermatomes.  Reflexes: 2+ at both patellar tendons, 2+ at achilles tendons, Babinski's downgoing.  Strength at foot  Plantar-flexion: 5/5 Dorsi-flexion: 5/5 Eversion: 5/5 Inversion: 5/5  Leg strength  Quad: 5/5 Hamstring: 5/5 Hip flexor: 5/5 Hip abductors: 4/5  Gait unremarkable.    Impression and Recommendations:     This case required  medical decision making of moderate complexity.

## 2015-03-07 NOTE — Patient Instructions (Addendum)
Good to see you Lets get picture of your back today or some time soon Heat 20 minutes ice 20 minutes then off 20 minutes as much as you want.  Continue the gabapentin  2 injections today.  Starting monday new exercises for your lower back 3 times a week.  Continue to wear good shoes Flexeril up to 3 times a day.  See me again in 2-3 weeks.

## 2015-03-07 NOTE — Assessment & Plan Note (Signed)
Patient is having pain that can be consistent with more of a lumbar radiculopathy. She does have a positive straight leg test. Patient is having more pain and radicular symptoms that does correspond with an L5 and then the S1 nerve root impingement. Patient was given injections today, home exercises, icing protocol, and will try some over-the-counter medications. Patient had a refill of her muscle relaxer. Discussed with patient that she has a good chance of healing. We will get x-rays to further evaluate for bony abnormalities. I'm guessing that there will be some underlying osteoarthritic changes. Patient come back in 2 weeks. If continuing have pain we will need to consider further imaging potentially as well as possibly formal physical therapy.

## 2015-03-13 ENCOUNTER — Telehealth: Payer: Self-pay | Admitting: Internal Medicine

## 2015-03-13 NOTE — Telephone Encounter (Signed)
Christy May called to ask Dr. Jenny Reichmann to reconsider her dismissal.  I have talked with Dr. Jenny Reichmann and the dismissal remains.

## 2015-03-18 DIAGNOSIS — G5601 Carpal tunnel syndrome, right upper limb: Secondary | ICD-10-CM | POA: Diagnosis not present

## 2015-03-18 DIAGNOSIS — G5602 Carpal tunnel syndrome, left upper limb: Secondary | ICD-10-CM | POA: Diagnosis not present

## 2015-03-19 ENCOUNTER — Encounter: Payer: Self-pay | Admitting: Internal Medicine

## 2015-03-25 DIAGNOSIS — H903 Sensorineural hearing loss, bilateral: Secondary | ICD-10-CM | POA: Diagnosis not present

## 2015-03-26 ENCOUNTER — Ambulatory Visit: Payer: Medicare Other | Admitting: Family Medicine

## 2015-03-26 ENCOUNTER — Other Ambulatory Visit: Payer: Self-pay | Admitting: Emergency Medicine

## 2015-03-26 DIAGNOSIS — Z0289 Encounter for other administrative examinations: Secondary | ICD-10-CM

## 2015-03-29 ENCOUNTER — Ambulatory Visit: Payer: Medicare Other | Admitting: *Deleted

## 2015-03-29 ENCOUNTER — Telehealth: Payer: Self-pay | Admitting: Endocrinology

## 2015-03-29 DIAGNOSIS — E0841 Diabetes mellitus due to underlying condition with diabetic mononeuropathy: Secondary | ICD-10-CM

## 2015-03-29 NOTE — Telephone Encounter (Signed)
Patient confirmed she is taking 160 units of insulin and will increase to 170. Patient will call next week to report blood sugar readings.

## 2015-03-29 NOTE — Telephone Encounter (Signed)
See note below and please advise, Thanks! 

## 2015-03-29 NOTE — Telephone Encounter (Signed)
Patient stated that her b/s has been running high [id number], 245, 194, 321

## 2015-03-29 NOTE — Telephone Encounter (Signed)
Please verify current insulin is 160 units qam. Then increase to 170 units qam

## 2015-03-29 NOTE — Progress Notes (Signed)
Patient ID: Christy May, female   DOB: Dec 01, 1950, 64 y.o.   MRN: [id number] Patient presents for measurement and scanning for diabetic shoes

## 2015-04-03 ENCOUNTER — Ambulatory Visit: Payer: Medicare Other | Admitting: Family Medicine

## 2015-04-03 DIAGNOSIS — Z0289 Encounter for other administrative examinations: Secondary | ICD-10-CM

## 2015-04-04 ENCOUNTER — Ambulatory Visit (INDEPENDENT_AMBULATORY_CARE_PROVIDER_SITE_OTHER): Payer: Medicare Other | Admitting: Family Medicine

## 2015-04-04 ENCOUNTER — Encounter: Payer: Self-pay | Admitting: Family Medicine

## 2015-04-04 VITALS — HR 93 | Ht 64.0 in | Wt 298.0 lb

## 2015-04-04 DIAGNOSIS — G894 Chronic pain syndrome: Secondary | ICD-10-CM | POA: Diagnosis not present

## 2015-04-04 MED ORDER — VENLAFAXINE HCL ER 37.5 MG PO CP24: 37.5000 mg | ORAL_CAPSULE | Freq: Every day | ORAL | Status: DC

## 2015-04-04 MED ORDER — ALLOPURINOL 300 MG PO TABS: 300.0000 mg | ORAL_TABLET | Freq: Every day | ORAL | Status: DC

## 2015-04-04 NOTE — Progress Notes (Signed)
Pre visit review using our clinic review tool, if applicable. No additional management support is needed unless otherwise documented below in the visit note. 

## 2015-04-04 NOTE — Patient Instructions (Signed)
Good to see you Call me if you have side effects Allopurinol one pill daily effexor 37.5mg  daily See me again in 2-3 weeks.

## 2015-04-04 NOTE — Progress Notes (Signed)
[facility] [address] [place], [place] [address] Phone: [phone] Subjective:     CC: Neck and back pain follow up  [id number] Glendoris R Seto is a 64 y.o. female coming in with complaint of neck and back pain. Patient states that the neck pain seems to have a past medical history significant for a C3-C7 fusion. Patient unfortunately continued to have pain even after the surgery. Patient has been following up with her neurosurgeon and they have discussed the possibility of more surgery. Patient would like to avoid this.   Patient has not been seen for quite some time. Continues to have significant back and neck problems. Patient states that the pain and chronic pain syndrome that she has does not seem to be responding to any of the other medications including narcotics at this time. Patient feels that the pain is causing her to be depressed. Starting to affect all her daily activities. Denies any suicidal or homicidal ideation. Has not discussed this with her primary care physician at this point. Patient is wondering what all she can possibly do to help with the pain.     Past Medical History  Diagnosis Date  . Anemia, unspecified   . Dehydration   . Depressive disorder, not elsewhere classified   . Esophageal reflux   . Unspecified essential hypertension   . Unspecified menopausal and postmenopausal disorder   . Obesity, unspecified   . Inflammatory and toxic neuropathy, unspecified   . Unspecified vitamin D deficiency   . Dysphagia 2007    historyof dysphagia with severe dysmotility by barium swallow-[name]  . Obesity   . Hypertension   . Allergic bronchitis   . Complication of anesthesia     "I've had recall; I probably stopped breathing at least 2 times" (09/03/2014)  . Family history of adverse reaction to anesthesia     "my brother was told they lost him a couple times during OR"  . OSA (obstructive sleep apnea)     "stopped  using my CPAP; just couldn't use it cause it kept me awake and I couldn't keep it on my face'"(09/03/2014)  . Heart murmur   . Chronic airway obstruction, not elsewhere classified     "I was told I don't have this/tests done @ [facility] 05/2014"  . Extrinsic asthma, unspecified     "I was told I don't have this/tests done @ [facility] 05/2014"  . Type II diabetes mellitus ([place])   . History of hiatal hernia   . Headache     "weekly to monthly" (09/03/2014)  . Migraine     "couple times/month right now" (09/03/2014)  . Spondylosis of unspecified site without mention of myelopathy   . Arthritis     "neck, back, hands" (09/03/2014)  . Diabetic peripheral neuropathy ([place])   . Chronic back pain   . Allergy   . Anxiety    Past Surgical History  Procedure Laterality Date  . Refractive surgery Bilateral   . Anterior cervical decomp/discectomy fusion      multiple cervical spine levels;Dr. Arnoldo Morale  . Incision and drainage abscess      Chest  . Dilation and curettage of uterus    . Exploratory laparotomy    . Total knee arthroplasty Left 10/2003  . Total knee arthroplasty Right 01/2004    [name]  . Foot surgery Right X 2    "took blood out of my arms and put platelets in my feet"  . Appendectomy    .  Tonsillectomy    . Esophagogastroduodenoscopy N/A 05/08/2014    Procedure: ESOPHAGOGASTRODUODENOSCOPY (EGD);  Surgeon: [name], MD;  Location: [name];  Service: Endoscopy;  Laterality: N/A;  . Colonoscopy N/A 05/08/2014    Procedure: COLONOSCOPY;  Surgeon: [name], MD;  Location: [facility];  Service: Endoscopy;  Laterality: N/A;  . Back surgery    . Cholecystectomy open    . Abdominal hysterectomy      "partial"  . Eye surgery    . Joint replacement     Social History  Substance Use Topics  . Smoking status: Former Smoker -- 0.00 packs/day for 0 years    Types: Cigarettes    Quit date: 06/22/1986  . Smokeless tobacco: Never Used  . Alcohol Use: Yes   Family  History  Problem Relation Age of Onset  . Esophageal cancer Brother   . Esophageal cancer Sister     ?  . Colon polyps Brother   . Pancreatic cancer Sister     ?  . Diabetes Sister   . Diabetes      Aunt and Uncle  . Heart disease Maternal Grandfather    Allergies  Allergen Reactions  . Ace Inhibitors Anaphylaxis  . Penicillins Hives and Rash  . Tape Hives    Paper tape is ok to use  . Adhesive [Tape] Rash        Past medical history, social, surgical and family history all reviewed in electronic medical record.   Review of Systems: No headache, visual changes, nausea, vomiting, diarrhea, constipation, dizziness, abdominal pain, skin rash, fevers, chills, night sweats, weight loss, swollen lymph nodes, body aches, joint swelling, muscle aches, chest pain, shortness of breath, mood changes.   Objective Pulse 93, height 5\' 4"  (1.626 m), weight 298 lb (135.172 kg), SpO2 90 %.  General: No apparent distress alert and oriented x3 mood and affect normal, dressed appropriately. Morbidly obese HEENT: Pupils equal, extraocular movements intact  Respiratory: Patient's speak in full sentences and does not appear short of breath  Cardiovascular: No lower extremity edema, non tender, no erythema  Skin: Warm dry intact with no signs of infection or rash on extremities or on axial skeleton.  Abdomen: Soft nontender  Neuro: Cranial nerves II through XII are intact, neurovascularly intact in all extremities with 2+ DTRs and 2+ pulses.  Lymph: No lymphadenopathy of posterior or anterior cervical chain or axillae bilaterally.  Gait normal with good balance and coordination.  MSK:  Non tender with full range of motion and good stability and symmetric strength and tone of shoulders, elbows, wrist, hip, knee and ankles bilaterally. Severe osteoarthritic changes of multiple joints diffuse tenderness of multiple joints Neck: Loss of lordosis secondary to previous surgeries No palpable  stepoffs. Positive Spurling the right side and 5 C6 distribution Significant limitation in range of motion lacking any extension as well as side bending bilaterally. Lacking the last 5 of flexion. Grip strength and sensation normal in bilateral hands Strength good C4 to T1 distribution No sensory change to C4 to T1 Negative Hoffman sign bilaterally Reflexes normal Mild impingement signs of the shoulder  Back Exam:  Inspection: Unremarkable  Motion: Flexion 25 deg, Extension 15 deg, Side Bending to 25 deg bilaterally,  Rotation to 25 deg bilaterally  SLR laying: Positive right side XSLR laying: Negative  Palpable tenderness: Severely tender in the lumbar musculature as well as the piriformis area on the right side FABER: Unable to do secondary to pain. Sensory change: Gross sensation  intact to all lumbar and sacral dermatomes.  Reflexes: 2+ at both patellar tendons, 2+ at achilles tendons, Babinski's downgoing.  Strength at foot  Plantar-flexion: 5/5 Dorsi-flexion: 5/5 Eversion: 5/5 Inversion: 5/5  Leg strength  Quad: 5/5 Hamstring: 5/5 Hip flexor: 4/5 Hip abductors: 4/5  Gait unremarkable.    Impression and Recommendations:     This case required medical decision making of moderate complexity.

## 2015-04-04 NOTE — Assessment & Plan Note (Signed)
Patient has multiple different joint pains at this time. Patient's also seems to have some feelings of depression that are likely contribute in. Patient has been on antidepressants in the past but we did discuss that we could be using a lower dose for more of the chronic pain syndrome. Patient finally after much deliberation did decide on trying the Effexor. Warned of potential side effects. We discussed though with patient's other comorbidities then patient may need to be sent to a pain management center for possible injections. Patient and will follow-up with me again in 3 weeks for further evaluation and treatment.  Spent  25 minutes with patient face-to-face and had greater than 50% of counseling including as described above in assessment and plan.

## 2015-04-08 ENCOUNTER — Other Ambulatory Visit: Payer: Self-pay | Admitting: Emergency Medicine

## 2015-04-09 ENCOUNTER — Telehealth: Payer: Self-pay | Admitting: Emergency Medicine

## 2015-04-09 NOTE — Telephone Encounter (Signed)
Please make sure she is taking her usual allergy regimen. Have her start Mucinex D and chlorpheniramine 4 mg up to every 6 hours when necessary. She can also try Delsym twice a day if needed. If she would like a cough syrup called in then we can write her prescription for Hycodan 5 mL every 6 hours when necessary for cough, 120cc, 0 RF

## 2015-04-09 NOTE — Telephone Encounter (Signed)
Called and spoke to pt. Pt c/o increase in SOB, prod cough with thick clear mucus, chest congestion, wheezing, and frontal sinus headache x 3 days. Pt denies f/c/s and CP/tightness. Pt is requesting recs. Pt states she is wearing CPAP nightly.   Dr. Lamonte Sakai please advise. Thanks.

## 2015-04-09 NOTE — Telephone Encounter (Signed)
Left detailed message of RB's recs and that we will call back on 10.19.2016.

## 2015-04-10 MED ORDER — HYDROCODONE-HOMATROPINE 5-1.5 MG/5ML PO SYRP: 5.0000 mL | ORAL_SOLUTION | Freq: Four times a day (QID) | ORAL | Status: DC | PRN

## 2015-04-10 NOTE — Telephone Encounter (Signed)
Patient returned call and can be reached at [phone].

## 2015-04-10 NOTE — Telephone Encounter (Signed)
Rx has been signed by RB and placed up front up for pick up. Pt is aware. Nothing further was needed.

## 2015-04-10 NOTE — Telephone Encounter (Signed)
Spoke with pt, aware of recs.  Will pick up cough syrup rx this afternoon (this cannot be called in).  rx printed and placed in RB's inbasket.   Will forward to [place] to follow up on.

## 2015-04-10 NOTE — Telephone Encounter (Signed)
LMTCB

## 2015-04-11 ENCOUNTER — Telehealth: Payer: Self-pay | Admitting: Emergency Medicine

## 2015-04-11 NOTE — Telephone Encounter (Signed)
Collene Gobble, MD at 04/09/2015 5:38 PM     Status: Signed       Expand All Collapse All   Please make sure she is taking her usual allergy regimen. Have her start Mucinex D and chlorpheniramine 4 mg up to every 6 hours when necessary. She can also try Delsym twice a day if needed. If she would like a cough syrup called in then we can write her prescription for Hycodan 5 mL every 6 hours when necessary for cough, 120cc, 0 RF       Called and spoke to pt. Pt is requesting the OTC meds be reiterated. Informed her of the recs per RB. Pt verbalized understanding and denied any further questions or concerns at this time.

## 2015-04-15 ENCOUNTER — Other Ambulatory Visit: Payer: Self-pay | Admitting: Internal Medicine

## 2015-04-15 ENCOUNTER — Other Ambulatory Visit: Payer: Self-pay | Admitting: Endocrinology

## 2015-04-15 ENCOUNTER — Other Ambulatory Visit: Payer: Self-pay | Admitting: Emergency Medicine

## 2015-04-15 DIAGNOSIS — M654 Radial styloid tenosynovitis [de Quervain]: Secondary | ICD-10-CM | POA: Diagnosis not present

## 2015-04-15 DIAGNOSIS — G5602 Carpal tunnel syndrome, left upper limb: Secondary | ICD-10-CM | POA: Diagnosis not present

## 2015-04-15 DIAGNOSIS — G5601 Carpal tunnel syndrome, right upper limb: Secondary | ICD-10-CM | POA: Diagnosis not present

## 2015-04-18 ENCOUNTER — Ambulatory Visit: Payer: Medicare Other | Admitting: Family Medicine

## 2015-04-18 DIAGNOSIS — Z0289 Encounter for other administrative examinations: Secondary | ICD-10-CM

## 2015-04-19 NOTE — Therapy (Signed)
[facility] [address] [place], [place], [address] Phone: [phone]   Fax:  [phone]  Patient Details  Name: Christy May MRN: [id number] Date of Birth: 1951-03-16 Referring Provider:  No ref. provider found  Encounter Date: 04/19/2015  SPEECH THERAPY DISCHARGE SUMMARY  Visits from Start of Care: 7  Current functional level related to goals: SLP SHORT TERM GOAL #1      Title  Pt will converse for 5 minutes using abdominal breathing with occassional min A. (due 08/23/14)     Status  Achieved     SLP SHORT TERM GOAL #2     Title  Pt will reports successful achievement of relaxation techniques outside of therapy over 3 sessions (due 08/23/14)     Time  1     Period  Weeks     Status  On-going     SLP SHORT TERM GOAL #3     Title  Pt will report decreased frequency of VCD symptoms outside of therapy by 25% subjectively (due 08/23/14)     Time  1     Period  Weeks     Status  On-going                SLP Long Term Goals - 08/21/14 1635     SLP LONG TERM GOAL #1     Title  Pt will demonstrate compensatory strategies  for relaxation outside of therapy over 6 sessions (due 09/18/14)     Time  5     Period  Weeks     Status  On-going     SLP LONG TERM GOAL #2     Title  Pt will converse for 10 minutes with abdominal breathing with rare min A (due 09/18/14)     Time  5     Period  Weeks     Status  On-going     SLP LONG TERM GOAL #3     Title  Pt will report decreased frequency of VCD symptoms outside of therapy by 50% subjectively (due 09/18/14)     Time  5     Period  Weeks     Status  On-going    As seen above, pt did not meet LTGs in 6 ST sessions but had met one of three long term goals with one week remaining. She made gains in abdominal breathing and was going to decrease to x1/week due to progress.  She did not return after the visit on 08-21-14 and SLP assumes pt does not desire further ST.     Remaining deficits: Vocal cord dysfunction (VCD)   Education / Equipment: Abdominal breathing, relaxation strategies.  Plan: Patient agrees to discharge.  Patient goals were partially met. Patient is being discharged due to not returning since the last visit.  ?????      Foster , MS, CCC-SLP  04/19/2015, 9:24 AM  [facility] [address] [place], [place], [address] Phone: [phone]   Fax:  [phone]

## 2015-04-19 NOTE — Therapy (Signed)
[facility] [address] [place], [place], [address] Phone: [phone]   Fax:  [phone]  Patient Details  Name: Christy May MRN: [id number] Date of Birth: 10-15-1950 Referring Provider:  No ref. provider found  Encounter Date: 04/19/2015   Correction for note dated earlier on 04-19-15:  Pt did not return after visit on 08-28-14 (not 08-21-14).   [facility] [place] , [place], [place]  04/19/2015, 9:31 AM  [facility] [address] [place], [place], [address] Phone: [phone]   Fax:  [phone]

## 2015-04-23 ENCOUNTER — Ambulatory Visit (INDEPENDENT_AMBULATORY_CARE_PROVIDER_SITE_OTHER): Payer: Medicare Other | Admitting: Podiatry

## 2015-04-23 DIAGNOSIS — E1149 Type 2 diabetes mellitus with other diabetic neurological complication: Secondary | ICD-10-CM | POA: Diagnosis not present

## 2015-04-23 DIAGNOSIS — M2041 Other hammer toe(s) (acquired), right foot: Secondary | ICD-10-CM

## 2015-04-23 DIAGNOSIS — E1342 Other specified diabetes mellitus with diabetic polyneuropathy: Secondary | ICD-10-CM | POA: Diagnosis not present

## 2015-04-23 DIAGNOSIS — L84 Corns and callosities: Secondary | ICD-10-CM

## 2015-04-23 DIAGNOSIS — M2042 Other hammer toe(s) (acquired), left foot: Secondary | ICD-10-CM | POA: Diagnosis not present

## 2015-04-23 NOTE — Patient Instructions (Signed)

## 2015-04-23 NOTE — Progress Notes (Signed)
Patient ID: Christy May, female   DOB: 05-11-1951, 64 y.o.   MRN: [id number] Patient presents for diabetic shoe pick up, shoes are tried on for good fit.  Patient received 1 Pair Apex A302W Janice Bone in women's size 9 extra wide and 3 pairs custom molded diabetic inserts.  Verbal and written break in and wear instructions given.  Patient will follow up for scheduled routine care.

## 2015-04-25 ENCOUNTER — Other Ambulatory Visit: Payer: Self-pay | Admitting: Emergency Medicine

## 2015-05-09 ENCOUNTER — Ambulatory Visit (INDEPENDENT_AMBULATORY_CARE_PROVIDER_SITE_OTHER): Payer: Medicare Other | Admitting: Podiatry

## 2015-05-09 DIAGNOSIS — M79673 Pain in unspecified foot: Secondary | ICD-10-CM | POA: Diagnosis not present

## 2015-05-09 DIAGNOSIS — B351 Tinea unguium: Secondary | ICD-10-CM | POA: Diagnosis not present

## 2015-05-09 DIAGNOSIS — Q828 Other specified congenital malformations of skin: Secondary | ICD-10-CM

## 2015-05-09 DIAGNOSIS — E1149 Type 2 diabetes mellitus with other diabetic neurological complication: Secondary | ICD-10-CM | POA: Diagnosis not present

## 2015-05-09 NOTE — Progress Notes (Signed)
Subjective:     Patient ID: Christy May, female   DOB: 10/31/1950, 64 y.o.   MRN: [id number]  HPI patient presents with nailbeds that are painful and lesions under the fifth metatarsals of both feet with long-term diabetes and extreme obesity is complicating factor   Review of Systems     Objective:   Physical Exam Neurovascular status diminished both sharp Dole vibratory in pulses PT and DP but unchanged from previous visit with no other change in health history and is noted to have thick yellow brittle nailbeds 1-5 both feet that are painful and keratotic lesion sub-5 bilateral that bother her and makes shoe gear difficult    Assessment:     At risk diabetic with pre-ulcerated of type callus formation diminished circulatory and neurological sensation and painful nailbeds 1-5 both feet    Plan:     Discussed diabetic shoes and we'll get approval and debrided nailbeds 1-5 both feet and lesions on both feet with no iatrogenic bleeding noted      

## 2015-05-15 ENCOUNTER — Other Ambulatory Visit: Payer: Self-pay | Admitting: Endocrinology

## 2015-05-22 ENCOUNTER — Encounter: Payer: Self-pay | Admitting: Endocrinology

## 2015-05-22 ENCOUNTER — Ambulatory Visit (INDEPENDENT_AMBULATORY_CARE_PROVIDER_SITE_OTHER): Payer: Medicare Other | Admitting: Endocrinology

## 2015-05-22 VITALS — BP 137/90 | HR 75 | Temp 97.9°F | Ht 64.0 in | Wt 303.0 lb

## 2015-05-22 DIAGNOSIS — IMO0002 Reserved for concepts with insufficient information to code with codable children: Secondary | ICD-10-CM

## 2015-05-22 DIAGNOSIS — E1040 Type 1 diabetes mellitus with diabetic neuropathy, unspecified: Secondary | ICD-10-CM | POA: Diagnosis not present

## 2015-05-22 DIAGNOSIS — E1065 Type 1 diabetes mellitus with hyperglycemia: Secondary | ICD-10-CM

## 2015-05-22 LAB — POCT GLYCOSYLATED HEMOGLOBIN (HGB A1C): Hemoglobin A1C: 7.5

## 2015-05-22 MED ORDER — INSULIN NPH (HUMAN) (ISOPHANE) 100 UNIT/ML ~~LOC~~ SUSP: 170.0000 [IU] | Freq: Every day | SUBCUTANEOUS | Status: DC

## 2015-05-22 NOTE — Patient Instructions (Addendum)
Please continue the same insulin. please call [phone] ([facility] physician referral line), to get an appointment with a new primary provider On this type of insulin schedule, you should eat meals on a regular schedule.  If a meal is missed or significantly delayed, your blood sugar could go low. check your blood sugar twice a day.  vary the time of day when you check, between before the 3 meals, and at bedtime.  also check if you have symptoms of your blood sugar being too high or too low.  please keep a record of the readings and bring it to your next appointment here.  You can write it on any piece of paper.  please call us sooner if your blood sugar goes below 70, or if you have a lot of readings over 200. Please come back for a follow-up appointment in 3-4 months.

## 2015-05-22 NOTE — Progress Notes (Signed)
Subjective:    Patient ID: Christy May, female    DOB: [birth year], 64 y.o.   MRN: [id number]  HPI Pt returns for f/u of diabetes mellitus: DM type: Insulin-requiring type 2 Dx'ed: [id number] Complications: polyneuropathy Therapy: insulin since 2002.   GDM: never DKA: never Severe hypoglycemia: never. Pancreatitis: never Other: she was changed to a qd insulin regimen, after poor results on multiple daily injections; she wants the cheapest possible insulin.  She declines weight loss surgery.   Interval history: She takes 170 units qam.  no cbg record, but states cbg's vary from 113-279.  There is no trend throughout the day.  No recent steroids.  pt states she feels well in general.  She seldom has hypoglycemia, and these episodes are mild.  This happens when a meal is misses or delayed.   Past Medical History  Diagnosis Date  . Anemia, unspecified   . Dehydration   . Depressive disorder, not elsewhere classified   . Esophageal reflux   . Unspecified essential hypertension   . Unspecified menopausal and postmenopausal disorder   . Obesity, unspecified   . Inflammatory and toxic neuropathy, unspecified   . Unspecified vitamin D deficiency   . Dysphagia 2007    historyof dysphagia with severe dysmotility by barium swallow-[name]  . Obesity   . Hypertension   . Allergic bronchitis   . Complication of anesthesia     "I've had recall; I probably stopped breathing at least 2 times" (09/03/2014)  . Family history of adverse reaction to anesthesia     "my brother was told they lost him a couple times during OR"  . OSA (obstructive sleep apnea)     "stopped using my CPAP; just couldn't use it cause it kept me awake and I couldn't keep it on my face'"(09/03/2014)  . Heart murmur   . Chronic airway obstruction, not elsewhere classified     "I was told I don't have this/tests done @ [facility] 05/2014"  . Extrinsic asthma, unspecified     "I was told I don't have this/tests done @  [facility] 05/2014"  . Type II diabetes mellitus ([place])   . History of hiatal hernia   . Headache     "weekly to monthly" (09/03/2014)  . Migraine     "couple times/month right now" (09/03/2014)  . Spondylosis of unspecified site without mention of myelopathy   . Arthritis     "neck, back, hands" (09/03/2014)  . Diabetic peripheral neuropathy ([place])   . Chronic back pain   . Allergy   . Anxiety     Past Surgical History  Procedure Laterality Date  . Refractive surgery Bilateral   . Anterior cervical decomp/discectomy fusion      multiple cervical spine levels;Dr. Arnoldo Morale  . Incision and drainage abscess      Chest  . Dilation and curettage of uterus    . Exploratory laparotomy    . Total knee arthroplasty Left 10/2003  . Total knee arthroplasty Right 01/2004    [name]  . Foot surgery Right X 2    "took blood out of my arms and put platelets in my feet"  . Appendectomy    . Tonsillectomy    . Esophagogastroduodenoscopy N/A 05/08/2014    Procedure: ESOPHAGOGASTRODUODENOSCOPY (EGD);  Surgeon: [name], MD;  Location: [name];  Service: Endoscopy;  Laterality: N/A;  . Colonoscopy N/A 05/08/2014    Procedure: COLONOSCOPY;  Surgeon: [name], MD;  Location: [facility];  Service: Endoscopy;  Laterality: N/A;  . Back surgery    . Cholecystectomy open    . Abdominal hysterectomy      "partial"  . Eye surgery    . Joint replacement      Social History   Social History  . Marital Status: Widowed    Spouse Name: N/A  . Number of Children: N/A  . Years of Education: N/A   Occupational History  . disabled    Social History Main Topics  . Smoking status: Former Smoker -- 0.00 packs/day for 0 years    Types: Cigarettes    Quit date: 06/22/1986  . Smokeless tobacco: Never Used  . Alcohol Use: Yes  . Drug Use: No  . Sexual Activity: Not on file   Other Topics Concern  . Not on file   Social History Narrative   ** Merged History Encounter **       Patient  does not get regular exercise.      Widowed 2004      Disabled    Current Outpatient Prescriptions on File Prior to Visit  Medication Sig Dispense Refill  . albuterol (PROVENTIL) (2.5 MG/3ML) 0.083% nebulizer solution Take 2.5 mg by nebulization every 6 (six) hours as needed for wheezing or shortness of breath.    . allopurinol (ZYLOPRIM) 300 MG tablet Take 1 tablet (300 mg total) by mouth daily. 30 tablet 6  . aspirin 81 MG tablet Take 81 mg by mouth at bedtime.     . cholecalciferol (VITAMIN D) 1000 UNITS tablet Take 2,000 Units by mouth every morning.     . cyclobenzaprine (FLEXERIL) 5 MG tablet Take 1 tablet (5 mg total) by mouth 3 (three) times daily as needed for muscle spasms. 90 tablet 1  . famotidine (PEPCID) 20 MG tablet TAKE 1 TABLET BY MOUTH EVERY NIGHT AT BEDTIME 30 tablet 5  . Fish Oil OIL by Does not apply route.    . fluticasone (FLONASE) 50 MCG/ACT nasal spray Place 2 sprays into both nostrils every morning. 16 g 6  . folic acid (FOLVITE) 1 MG tablet Take 4 tablets (4 mg total) by mouth daily. 120 tablet 11  . gabapentin (NEURONTIN) 800 MG tablet Take 1 tablet (800 mg total) by mouth 3 (three) times daily. 270 tablet 1  . glucosamine-chondroitin 500-400 MG tablet Take 1 tablet by mouth 3 (three) times daily.    Marland Kitchen glucose blood (ONE TOUCH ULTRA TEST) test strip USE TO TEST BLOOD SUGAR TWICE DAILY. DX: E10.41 100 each 5  . irbesartan (AVAPRO) 300 MG tablet Take 1 tablet (300 mg total) by mouth daily. 90 tablet 1  . Lancets (ONETOUCH ULTRASOFT) lancets Use to test blood sugars twice a day. 100 each 12  . loratadine (CLARITIN) 10 MG tablet TAKE 1 TABLET BY MOUTH EVERY DAY 30 tablet 5  . metoprolol (LOPRESSOR) 50 MG tablet Take 1.5 tablets (75 mg total) by mouth 2 (two) times daily. 270 tablet 3  . montelukast (SINGULAIR) 10 MG tablet TAKE 1 TABLET BY MOUTH DAILY 90 tablet 0  . nystatin cream (MYCOSTATIN) APPLY TO AFFECTED AREA TWICE DAILY 30 g 0  . omeprazole (PRILOSEC) 40 MG  capsule TAKE 1 CAPSULE(40 MG) BY MOUTH DAILY 30 capsule 5  . ondansetron (ZOFRAN) 4 MG tablet Take 1 tablet (4 mg total) by mouth every 8 (eight) hours as needed for nausea or vomiting. 40 tablet 1  . ONE TOUCH ULTRA TEST test strip USE TO TEST BLOOD SUGARS TWICE DAILY  100 each 2  . oxybutynin (DITROPAN-XL) 10 MG 24 hr tablet Take 1 tablet (10 mg total) by mouth daily. 90 tablet 1  . polyethylene glycol powder (GLYCOLAX/MIRALAX) powder Take 17 g by mouth daily. 3350 g 1  . promethazine (PHENERGAN) 25 MG tablet TAKE 1 TABLET(25 MG) BY MOUTH EVERY 6 HOURS AS NEEDED FOR NAUSEA OR VOMITING 30 tablet 1  . traMADol (ULTRAM) 50 MG tablet TAKE 1 TABLET BY MOUTH EVERY 6 HOURS AS NEEDED 60 tablet 2   No current facility-administered medications on file prior to visit.    Allergies  Allergen Reactions  . Ace Inhibitors Anaphylaxis  . Penicillins Hives and Rash  . Tape Hives    Paper tape is ok to use  . Adhesive [Tape] Rash    Family History  Problem Relation Age of Onset  . Esophageal cancer Brother   . Esophageal cancer Sister     ?  . Colon polyps Brother   . Pancreatic cancer Sister     ?  . Diabetes Sister   . Diabetes      Aunt and Uncle  . Heart disease Maternal Grandfather     BP 137/90 mmHg  Pulse 75  Temp(Src) 97.9 F (36.6 C) (Oral)  Ht 5\' 4"  (1.626 m)  Wt 303 lb (137.44 kg)  BMI 51.98 kg/m2  SpO2 99%  Review of Systems Denies LOC    Objective:   Physical Exam VITAL SIGNS:  See vs page GENERAL: no distress Pulses: dorsalis pedis intact bilat.   MSK: no deformity of the feet CV: no leg edema Skin:  no ulcer on the feet.  normal color and temp on the feet. Neuro: sensation is intact to touch on the feet    A1c=7.5%    Assessment & Plan:  DM: this is the best control this pt should aim for, given this regimen, which does match insulin to her changing needs throughout the day.  Patient is advised the following: Patient Instructions  Please continue the  same insulin. please call [phone] ([facility] physician referral line), to get an appointment with a new primary provider On this type of insulin schedule, you should eat meals on a regular schedule.  If a meal is missed or significantly delayed, your blood sugar could go low. check your blood sugar twice a day.  vary the time of day when you check, between before the 3 meals, and at bedtime.  also check if you have symptoms of your blood sugar being too high or too low.  please keep a record of the readings and bring it to your next appointment here.  You can write it on any piece of paper.  please call us sooner if your blood sugar goes below 70, or if you have a lot of readings over 200. Please come back for a follow-up appointment in 3-4 months.

## 2015-05-28 ENCOUNTER — Telehealth: Payer: Self-pay | Admitting: *Deleted

## 2015-05-28 ENCOUNTER — Telehealth: Payer: Self-pay | Admitting: Endocrinology

## 2015-05-28 NOTE — Telephone Encounter (Signed)
Please see your pcp or podiatrist for this.

## 2015-05-28 NOTE — Telephone Encounter (Signed)
I contacted the pt. She stated for the past couple of days she has been experiencing extreme pain due to hammer toe on her right foot. Pt wanted to know if we could do anything for her to help with this.  Please advise, Thanks!

## 2015-05-28 NOTE — Telephone Encounter (Signed)
Patient would like for Megan to please call her   Please advise patient    Thank you

## 2015-05-28 NOTE — Telephone Encounter (Signed)
Pt states her hammer toes are killing her.  I called and told her with her being diabetic, I didn't like that her toes hurt and she had something on the top that needed clipping.

## 2015-05-29 ENCOUNTER — Ambulatory Visit (INDEPENDENT_AMBULATORY_CARE_PROVIDER_SITE_OTHER): Payer: Medicare Other | Admitting: Podiatry

## 2015-05-29 ENCOUNTER — Encounter: Payer: Self-pay | Admitting: Podiatry

## 2015-05-29 ENCOUNTER — Ambulatory Visit (INDEPENDENT_AMBULATORY_CARE_PROVIDER_SITE_OTHER): Payer: Medicare Other

## 2015-05-29 VITALS — BP 151/80 | HR 73 | Resp 16

## 2015-05-29 DIAGNOSIS — M779 Enthesopathy, unspecified: Secondary | ICD-10-CM

## 2015-05-29 DIAGNOSIS — M2041 Other hammer toe(s) (acquired), right foot: Secondary | ICD-10-CM

## 2015-05-29 DIAGNOSIS — M79671 Pain in right foot: Secondary | ICD-10-CM | POA: Diagnosis not present

## 2015-05-29 MED ORDER — TRIAMCINOLONE ACETONIDE 10 MG/ML IJ SUSP
10.0000 mg | Freq: Once | INTRAMUSCULAR | Status: AC
  Administered 2015-05-29: 10 mg

## 2015-05-29 NOTE — Telephone Encounter (Signed)
I contacted the pt and advised of the note below.

## 2015-05-30 ENCOUNTER — Ambulatory Visit (INDEPENDENT_AMBULATORY_CARE_PROVIDER_SITE_OTHER): Payer: Medicare Other | Admitting: Adult Health

## 2015-05-30 ENCOUNTER — Encounter: Payer: Self-pay | Admitting: Adult Health

## 2015-05-30 VITALS — BP 132/74 | HR 80 | Temp 98.6°F | Ht 64.0 in | Wt 305.0 lb

## 2015-05-30 DIAGNOSIS — J209 Acute bronchitis, unspecified: Secondary | ICD-10-CM | POA: Diagnosis not present

## 2015-05-30 MED ORDER — PREDNISONE 10 MG PO TABS: ORAL_TABLET | ORAL | Status: DC

## 2015-05-30 MED ORDER — AZITHROMYCIN 250 MG PO TABS: ORAL_TABLET | ORAL | Status: AC

## 2015-05-30 MED ORDER — HYDROCODONE-HOMATROPINE 5-1.5 MG/5ML PO SYRP: 5.0000 mL | ORAL_SOLUTION | Freq: Four times a day (QID) | ORAL | Status: DC | PRN

## 2015-05-30 NOTE — Progress Notes (Signed)
Subjective:    Patient ID: Christy May, female    DOB: Oct 27, 1950, 64 y.o.   MRN: [id number]  HPI 64 yo woman, former smoker with obesity, OSA, HTN on irbesartan, DM, childhood asthma. Carries a dx of adult asthma made several years ago. Referred for cough. She describes cough that has been prod of white phlegm, started after URI sx about 4 months ago. Describes a tickle in her throat. Can result in stress incontinence.   She has been on Advair in the past. Was recently changed to QVAR + albuterol, uses about 2-3 x a month. She is on omeprazole 20mg  twice a day. She still has occasional reflux sx > can reflux all the way up to throat. She has allergy and rhinitis sx frequently - singulair has been added recently and has helped her.   CXR 12/22/13 - normal.  We will perform full pulmonary function testing  We will change your omeprazole to nexium 40mg  twice a day until our next visit Continue your singulair Stop QVAR for now Keep albuterol available to use 2 puffs if needed for shortness of breath Start loratadine (Claritin) 10mg  daily Start fluticasone nasal spray, 2 sprays each nostril daily Follow with Dr Lamonte Sakai in 1 month  Acute OV 02/01/14 --  Returns for persistent cough. Seen 1 month ago. Seen last month for pulmonary consult for cough.  She is quite upset that her cough is no better and she can not go anywhere due to stress incontinence w/ coughing fits. Has been going on for ~5 months  On Lopressor 75mg  Twice daily   CXR w/ no acute process 12/22/13 .  Former smoker, PFT pending.  Last ov was changed from prilosec to nexium for better GERD control. She continues to have intermittent reflux.  QVAR was held due to no improvement in symptoms. Previous on Advair in past without help.  No fever, orthopnea, edema , n/v/d, discolored mucus. Does have some sinus drainage.  Has used tussionex with some help, but is out of this.   ROV 02/14/14 -- follows for cough, GERD, PND, suspected  asthma. I changed omeprazole to nexium, added loratdine, flonase. TP added pepcid, chlorpheniramine. Her cough has gotten better but has not gone away completely. She is still having. She has been dx w sleep apnea - she has not been able to tolerate CPAP.   PFT done today 02/14/14 > shows some spiro evidence for restriction but for the most part normal. Could not do volumes.    ROV 03/13/14 -- follow up for chronic cough, influenced by GERD and rhinitis. Her PFT are reassuring > little evidence for asthma. Treated recently by Dr Jenny Reichmann for a possible bronchitis and flare. Temporarily improved but then cough right back to where she started.  She is on high dose nexium, flonase, singulair, pepcid qhs.  She is on loratadine qd, not on chlorpheniramine. She is having some nasal congestion. She still has some mild GERD sx.   ROV 08/09/14 -- follow-up visit for chronic cough. She has a history of GERD and allergic rhinitis. We have not been able to establish a diagnosis of asthma based on her spirometry.She had been feeling well, fairly good cough control. Has been going to the speech therapists at [facility] with benefit. She is on singulair, pepcid, Nexium, neurontin, fluticasone, loratadine. She asks today about OSA and CPAP.   ROV 10/30/14 -- follow-up visit for chronic cough, upper airway disease, GERD allergic rhinitis. She has benefited from speech therapy, loratadine,  Singulair, Pepcid, omeprazole,  Neurontin. She has been dx with OSA and we started her on auto-set CPAP since her last visit.  She tells me today that she doesn't have the CPAP yet.  She tells me that she has been having more trouble with her cough since last several weeks. She is coughing up some beige mucous.    ROV 03/05/15 -- chronic cough, upper airway disease, GERD allergic rhinitis. She follows today after her device download > reviewed this today, shows that she has worn it some, 11 / 30 days (37% of the days). She is having trouble  tolerating the mask, but does feel that it helps her, makes her feel better the next day. She just had an exacerbation of her cough in the setting of a URI. Her voice is scratchy for the last several days. Her cough is stable since she completed a pred taper.   05/30/2015 Acute OV   Patient complains of 3 days of productive cough and thick clear mucus, tightness, congestion, sinus pain and pressure and wheezing.Marland Kitchen She's been using over-the-counter cold medicines without much relief. Denies any fever, nausea or vomiting., Hemoptysis, orthopnea, PND, or increased leg swelling. Appetite is good..cough is keeping her up at night  Coughs so hard she has urinary incontinence .    Review of Systems As per HPI     Objective:   Physical Exam Vital signs reviewed   GEN: A/Ox3; pleasant , NAD, obese   HEENT:  OP clear, hoarse voice.   NECK:  No stridor  RESP  Clear bilaterally, + cough   CARD:  RRR, no m/r/g, trace edema.   Musco: Warm bil, no deformities or joint swelling noted.   Neuro: alert, no focal deficits noted.    Skin: Warm, no lesions or rashes      Assessment & Plan:

## 2015-05-30 NOTE — Patient Instructions (Signed)
Zpack take as directed  Prednisone taper over next week.  Hydromet 1 tsp every 6hras needed , may make you sleepy  follow up Dr. Lamonte Sakai  In 2-3 months and As needed   Please contact office for sooner follow up if symptoms do not improve or worsen or seek emergency care

## 2015-05-30 NOTE — Assessment & Plan Note (Signed)
URI /Bronchitis with pt with UACS   Plan  Zpack take as directed  Prednisone taper over next week.  Hydromet 1 tsp every 6hras needed , may make you sleepy  follow up Dr. Lamonte Sakai  In 2-3 months and As needed   Please contact office for sooner follow up if symptoms do not improve or worsen or seek emergency care

## 2015-05-30 NOTE — Progress Notes (Signed)
Subjective:     Patient ID: Christy May, female   DOB: July 23, 1950, 64 y.o.   MRN: [id number]  HPI patient presents with pain in her right foot that is localized in nature with no indications of significant other pathology. States that the toes have been bothering her and she wants to see what can be done   Review of Systems     Objective:   Physical Exam Neurovascular status intact with patient having inflammation around the second and third metatarsophalangeal joint and also distal thickness of the nailbeds themselves    Assessment:     Inflammatory capsulitis right foot along with nail disease and hammertoe deformity creating stress against the digits themselves    Plan:     Reviewed all conditions and did careful injection right to try to reduce the inflammatory complex and advised on padding type therapy and I did debride nails to try to take pressure off the beds. Reappoint for regular visit or as needed

## 2015-06-05 ENCOUNTER — Ambulatory Visit: Payer: Medicare Other | Admitting: Podiatry

## 2015-06-06 ENCOUNTER — Other Ambulatory Visit: Payer: Self-pay

## 2015-06-06 NOTE — Patient Outreach (Signed)
[facility]) Care Management  06/06/2015  JOMARA VOLKOV [birth year] [id number]  Returned call to Ms. Carrete who reports she has good news. Ms. Cassaday states her A1C has decreased from 11 to 7 this month. Mrs. 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 Ms. Alkhafaji states she has decided to use the rest of her life trying to work and help people that are hurting in the church. "I allow for sugar, but I don't make sugar my whole meal and I try to stay away from bread as much as possible".   Ms. Malueg states she still does not have a primary care physician, but she is attending her other specialist appointments. RNCM recommended she call customer service on her insurance card to find providers that are in her network. Ms. Nesheim stated she would do that.   Ms. Grandville Silos thanked RNCM and [facility] social worker, [place] for all that was done while she was engaged with [facility] [employer] services.    Thea Silversmith, RN, MSN, [place] Coordinator Cell: [phone]

## 2015-06-09 ENCOUNTER — Other Ambulatory Visit: Payer: Self-pay | Admitting: Family Medicine

## 2015-06-10 NOTE — Telephone Encounter (Signed)
Refill done.  

## 2015-06-18 ENCOUNTER — Ambulatory Visit (INDEPENDENT_AMBULATORY_CARE_PROVIDER_SITE_OTHER)
Admission: RE | Admit: 2015-06-18 | Discharge: 2015-06-18 | Disposition: A | Discharge status: Home / Self Care | Payer: Medicare Other | Source: Ambulatory Visit | Attending: Internal Medicine | Admitting: Internal Medicine

## 2015-06-18 ENCOUNTER — Other Ambulatory Visit: Payer: Medicare Other

## 2015-06-18 ENCOUNTER — Other Ambulatory Visit (INDEPENDENT_AMBULATORY_CARE_PROVIDER_SITE_OTHER): Payer: Medicare Other

## 2015-06-18 ENCOUNTER — Telehealth: Payer: Self-pay | Admitting: Emergency Medicine

## 2015-06-18 ENCOUNTER — Telehealth: Payer: Self-pay | Admitting: Endocrinology

## 2015-06-18 DIAGNOSIS — R0600 Dyspnea, unspecified: Secondary | ICD-10-CM

## 2015-06-18 DIAGNOSIS — R042 Hemoptysis: Secondary | ICD-10-CM

## 2015-06-18 DIAGNOSIS — R05 Cough: Secondary | ICD-10-CM | POA: Diagnosis not present

## 2015-06-18 LAB — BASIC METABOLIC PANEL
BUN: 15 mg/dL (ref 6–23)
CO2: 26 mEq/L (ref 19–32)
CREATININE: 1.07 mg/dL (ref 0.40–1.20)
Calcium: 9.8 mg/dL (ref 8.4–10.5)
Chloride: 104 mEq/L (ref 96–112)
GFR: 66.07 mL/min (ref >60.00)
GLUCOSE: 158 mg/dL — AB (ref 70–99)
Potassium: 4.2 mEq/L (ref 3.5–5.1)
Sodium: 138 mEq/L (ref 135–145)

## 2015-06-18 LAB — CBC WITH DIFFERENTIAL/PLATELET
BASOS PCT: 1 % (ref 0.0–3.0)
Basophils Absolute: 0.1 10*3/uL (ref 0.0–0.1)
EOS PCT: 2.6 % (ref 0.0–5.0)
Eosinophils Absolute: 0.2 10*3/uL (ref 0.0–0.7)
HEMATOCRIT: 36.8 % (ref 36.0–46.0)
Hemoglobin: 11.8 g/dL — ABNORMAL LOW (ref 12.0–15.0)
LYMPHS ABS: 2 10*3/uL (ref 0.7–4.0)
Lymphocytes Relative: 27.5 % (ref 12.0–46.0)
MCHC: 31.9 g/dL (ref 30.0–36.0)
MCV: 87 fl (ref 78.0–100.0)
MONOS PCT: 5.3 % (ref 3.0–12.0)
Monocytes Absolute: 0.4 10*3/uL (ref 0.1–1.0)
NEUTROS ABS: 4.7 10*3/uL (ref 1.4–7.7)
NEUTROS PCT: 63.6 % (ref 43.0–77.0)
PLATELETS: 211 10*3/uL (ref 150.0–400.0)
RBC: 4.24 Mil/uL (ref 3.87–5.11)
RDW: 17.1 % — ABNORMAL HIGH (ref 11.5–15.5)
WBC: 7.4 10*3/uL (ref 4.0–10.5)

## 2015-06-18 LAB — BRAIN NATRIURETIC PEPTIDE: Pro B Natriuretic peptide (BNP): 177 pg/mL — ABNORMAL HIGH (ref 0.0–100.0)

## 2015-06-18 MED ORDER — HYDROCODONE-HOMATROPINE 5-1.5 MG/5ML PO SYRP: 5.0000 mL | ORAL_SOLUTION | Freq: Four times a day (QID) | ORAL | Status: DC | PRN

## 2015-06-18 NOTE — Telephone Encounter (Signed)
Instructions given to patient

## 2015-06-18 NOTE — Telephone Encounter (Signed)
Per review of the chart, she is on NPH 170 units daily. Try to decrease the dose to 140 units and let us know about the sugars in 2-3 days.

## 2015-06-18 NOTE — Telephone Encounter (Addendum)
Called and spoke with patient. Informed her of CY's recs. I explained to her that once her cxr and labs were completed that the hydromet rx will be placed at the front of the office for pick up. Patient voiced understanding and had no further questions. Lab and cxr orders have been placed. RX printed, signed, and placed at the front of the office. Nothing further needed.

## 2015-06-18 NOTE — Telephone Encounter (Signed)
Ask for outpatient  CXR    Dx acute bronchitis                               Lab- CBC with dff, B Nat Peptide, D-dimer, BMET, dx hemoptysis  If the hydromet cough syrup helped at all, then ok to refill that

## 2015-06-18 NOTE — Telephone Encounter (Signed)
Called spoke with pt. She saw TP on 05/30/15 for acute visit> given ZPAK, pred taper and hydromet. Pt reports she has finished all of this and is still coughing. She reports today she has coughed up blood twice (unsure the amount and reports it was a bright red color). She also c/o chest burning, HA, chest tx, increase SOB. Requesting recs.  Please advise Dr. Annamaria Boots as RB/TP both on vacation this week,  Allergies  Allergen Reactions  . Ace Inhibitors Anaphylaxis  . Penicillins Hives and Rash  . Tape Hives    Paper tape is ok to use  . Adhesive [Tape] Rash     Current Outpatient Prescriptions on File Prior to Visit  Medication Sig Dispense Refill  . albuterol (PROVENTIL) (2.5 MG/3ML) 0.083% nebulizer solution Take 2.5 mg by nebulization every 6 (six) hours as needed for wheezing or shortness of breath.    . allopurinol (ZYLOPRIM) 300 MG tablet Take 1 tablet (300 mg total) by mouth daily. 30 tablet 6  . aspirin 81 MG tablet Take 81 mg by mouth at bedtime.     . cholecalciferol (VITAMIN D) 1000 UNITS tablet Take 2,000 Units by mouth every morning.     . cyclobenzaprine (FLEXERIL) 5 MG tablet Take 1 tablet (5 mg total) by mouth 3 (three) times daily as needed for muscle spasms. (Patient not taking: Reported on 05/30/2015) 90 tablet 1  . famotidine (PEPCID) 20 MG tablet TAKE 1 TABLET BY MOUTH EVERY NIGHT AT BEDTIME 30 tablet 5  . Fish Oil OIL by Does not apply route.    . fluticasone (FLONASE) 50 MCG/ACT nasal spray Place 2 sprays into both nostrils every morning. 16 g 6  . folic acid (FOLVITE) 1 MG tablet Take 4 tablets (4 mg total) by mouth daily. 120 tablet 11  . gabapentin (NEURONTIN) 800 MG tablet Take 1 tablet (800 mg total) by mouth 3 (three) times daily. 270 tablet 1  . glucosamine-chondroitin 500-400 MG tablet Take 1 tablet by mouth 3 (three) times daily.    Marland Kitchen glucose blood (ONE TOUCH ULTRA TEST) test strip USE TO TEST BLOOD SUGAR TWICE DAILY. DX: E10.41 100 each 5  .  HYDROcodone-homatropine (HYDROMET) 5-1.5 MG/5ML syrup Take 5 mLs by mouth every 6 (six) hours as needed. 240 mL 0  . insulin NPH Human (HUMULIN N) 100 UNIT/ML injection Inject 1.7 mLs (170 Units total) into the skin daily before breakfast. And syringes 2/day 60 mL 3  . irbesartan (AVAPRO) 300 MG tablet Take 1 tablet (300 mg total) by mouth daily. 90 tablet 1  . Lancets (ONETOUCH ULTRASOFT) lancets Use to test blood sugars twice a day. 100 each 12  . loratadine (CLARITIN) 10 MG tablet TAKE 1 TABLET BY MOUTH EVERY DAY 30 tablet 5  . metoprolol (LOPRESSOR) 50 MG tablet Take 1.5 tablets (75 mg total) by mouth 2 (two) times daily. 270 tablet 3  . montelukast (SINGULAIR) 10 MG tablet TAKE 1 TABLET BY MOUTH DAILY 90 tablet 0  . nystatin cream (MYCOSTATIN) APPLY TO AFFECTED AREA TWICE DAILY 30 g 0  . omeprazole (PRILOSEC) 40 MG capsule TAKE 1 CAPSULE(40 MG) BY MOUTH DAILY 30 capsule 5  . ondansetron (ZOFRAN) 4 MG tablet Take 1 tablet (4 mg total) by mouth every 8 (eight) hours as needed for nausea or vomiting. 40 tablet 1  . ONE TOUCH ULTRA TEST test strip USE TO TEST BLOOD SUGARS TWICE DAILY (Patient not taking: Reported on 05/30/2015) 100 each 2  . oxybutynin (DITROPAN-XL) 10 MG  24 hr tablet Take 1 tablet (10 mg total) by mouth daily. 90 tablet 1  . polyethylene glycol powder (GLYCOLAX/MIRALAX) powder Take 17 g by mouth daily. (Patient not taking: Reported on 05/30/2015) 3350 g 1  . predniSONE (DELTASONE) 10 MG tablet 4 tabs for 2 days, then 3 tabs for 2 days, 2 tabs for 2 days, then 1 tab for 2 days, then stop 20 tablet 0  . promethazine (PHENERGAN) 25 MG tablet TAKE 1 TABLET(25 MG) BY MOUTH EVERY 6 HOURS AS NEEDED FOR NAUSEA OR VOMITING (Patient not taking: Reported on 05/30/2015) 30 tablet 1  . traMADol (ULTRAM) 50 MG tablet TAKE 1 TABLET BY MOUTH EVERY 6 HOURS AS NEEDED (Patient not taking: Reported on 05/30/2015) 60 tablet 2  . venlafaxine XR (EFFEXOR-XR) 37.5 MG 24 hr capsule TAKE 1 CAPSULE(37.5 MG) BY  MOUTH DAILY WITH BREAKFAST 30 capsule 0   No current facility-administered medications on file prior to visit.

## 2015-06-18 NOTE — Telephone Encounter (Signed)
Please read message below and advise.  

## 2015-06-18 NOTE — Telephone Encounter (Signed)
Patient called stating her blood sugars are low and would like to know what she can do? She has tried orange juice and intake of food   Blood sugars: 12.26.16     77 12.27.16     65  Please advise   Thank you

## 2015-06-19 LAB — D-DIMER, QUANTITATIVE: D-Dimer, Quant: 0.84 ug/mL-FEU — ABNORMAL HIGH (ref 0.00–0.48)

## 2015-06-20 ENCOUNTER — Other Ambulatory Visit: Payer: Self-pay | Admitting: Internal Medicine

## 2015-06-20 ENCOUNTER — Telehealth: Payer: Self-pay | Admitting: Emergency Medicine

## 2015-06-20 MED ORDER — AZITHROMYCIN 250 MG PO TABS: ORAL_TABLET | ORAL | Status: DC

## 2015-06-20 MED ORDER — FUROSEMIDE 20 MG PO TABS: 20.0000 mg | ORAL_TABLET | Freq: Every morning | ORAL | Status: DC

## 2015-06-20 NOTE — Telephone Encounter (Signed)
Called spoke with pt. Aware of recs. RX's sent in. Nothing further needed 

## 2015-06-20 NOTE — Telephone Encounter (Signed)
Offer - lasix 20 mg, 1 each morning x 3 days to draw extra fluid out    # 3, no refill              Z pak for acute bronchitis

## 2015-06-20 NOTE — Telephone Encounter (Signed)
Notes Recorded by Deneise Lever, MD on 06/19/2015 at 9:08 PM Labs- D-dimer test for blood clots was a little above normal, but not in range we usually see with blood clots. Slight anemia Random non-fasting blood sugar too high- 158. BNP test for fluid over-load, such as heart failure, is a little higher than normal  Notes Recorded by Deneise Lever, MD on 06/19/2015 at 9:12 PM CXR- enlarged heart. Possible mild pneumonia or heart failure developing in left lung.  Called and spoke with pt and reviewed results and recs from labs and cxr which was order on 06/18/15. Pt c/o a prod cough with a small amount of bloody mucus on 06/18/15 all day, 06/19/15  And 06/20/15 during the morning. She states she has had a burning in her chest and increased SOB starting on 06/19/15. She she would like a message sent to [facility] for further recommendations. I explained to her that I would make CY aware and would call her with his recommendations. Pt voiced understanding and had no further questions.  CY please advise  Allergies  Allergen Reactions  . Ace Inhibitors Anaphylaxis  . Penicillins Hives and Rash  . Tape Hives    Paper tape is ok to use  . Adhesive [Tape] Rash     Current outpatient prescriptions:  .  albuterol (PROVENTIL) (2.5 MG/3ML) 0.083% nebulizer solution, Take 2.5 mg by nebulization every 6 (six) hours as needed for wheezing or shortness of breath., Disp: , Rfl:  .  allopurinol (ZYLOPRIM) 300 MG tablet, Take 1 tablet (300 mg total) by mouth daily., Disp: 30 tablet, Rfl: 6 .  aspirin 81 MG tablet, Take 81 mg by mouth at bedtime. , Disp: , Rfl:  .  cholecalciferol (VITAMIN D) 1000 UNITS tablet, Take 2,000 Units by mouth every morning. , Disp: , Rfl:  .  cyclobenzaprine (FLEXERIL) 5 MG tablet, Take 1 tablet (5 mg total) by mouth 3 (three) times daily as needed for muscle spasms. (Patient not taking: Reported on 05/30/2015), Disp: 90 tablet, Rfl: 1 .  famotidine (PEPCID) 20 MG tablet, TAKE 1  TABLET BY MOUTH EVERY NIGHT AT BEDTIME, Disp: 30 tablet, Rfl: 5 .  Fish Oil OIL, by Does not apply route., Disp: , Rfl:  .  fluticasone (FLONASE) 50 MCG/ACT nasal spray, Place 2 sprays into both nostrils every morning., Disp: 16 g, Rfl: 6 .  folic acid (FOLVITE) 1 MG tablet, Take 4 tablets (4 mg total) by mouth daily., Disp: 120 tablet, Rfl: 11 .  gabapentin (NEURONTIN) 800 MG tablet, Take 1 tablet (800 mg total) by mouth 3 (three) times daily., Disp: 270 tablet, Rfl: 1 .  glucosamine-chondroitin 500-400 MG tablet, Take 1 tablet by mouth 3 (three) times daily., Disp: , Rfl:  .  glucose blood (ONE TOUCH ULTRA TEST) test strip, USE TO TEST BLOOD SUGAR TWICE DAILY. DX: E10.41, Disp: 100 each, Rfl: 5 .  HYDROcodone-homatropine (HYDROMET) 5-1.5 MG/5ML syrup, Take 5 mLs by mouth every 6 (six) hours as needed., Disp: 240 mL, Rfl: 0 .  insulin NPH Human (HUMULIN N) 100 UNIT/ML injection, Inject 1.7 mLs (170 Units total) into the skin daily before breakfast. And syringes 2/day, Disp: 60 mL, Rfl: 3 .  irbesartan (AVAPRO) 300 MG tablet, Take 1 tablet (300 mg total) by mouth daily., Disp: 90 tablet, Rfl: 1 .  Lancets (ONETOUCH ULTRASOFT) lancets, Use to test blood sugars twice a day., Disp: 100 each, Rfl: 12 .  loratadine (CLARITIN) 10 MG tablet, TAKE 1 TABLET BY MOUTH  EVERY DAY, Disp: 30 tablet, Rfl: 5 .  metoprolol (LOPRESSOR) 50 MG tablet, Take 1.5 tablets (75 mg total) by mouth 2 (two) times daily., Disp: 270 tablet, Rfl: 3 .  montelukast (SINGULAIR) 10 MG tablet, TAKE 1 TABLET BY MOUTH DAILY, Disp: 90 tablet, Rfl: 0 .  nystatin cream (MYCOSTATIN), APPLY TO AFFECTED AREA TWICE DAILY, Disp: 30 g, Rfl: 0 .  omeprazole (PRILOSEC) 40 MG capsule, TAKE 1 CAPSULE(40 MG) BY MOUTH DAILY, Disp: 30 capsule, Rfl: 5 .  ondansetron (ZOFRAN) 4 MG tablet, Take 1 tablet (4 mg total) by mouth every 8 (eight) hours as needed for nausea or vomiting., Disp: 40 tablet, Rfl: 1 .  ONE TOUCH ULTRA TEST test strip, USE TO TEST BLOOD  SUGARS TWICE DAILY (Patient not taking: Reported on 05/30/2015), Disp: 100 each, Rfl: 2 .  oxybutynin (DITROPAN-XL) 10 MG 24 hr tablet, Take 1 tablet (10 mg total) by mouth daily., Disp: 90 tablet, Rfl: 1 .  polyethylene glycol powder (GLYCOLAX/MIRALAX) powder, Take 17 g by mouth daily. (Patient not taking: Reported on 05/30/2015), Disp: 3350 g, Rfl: 1 .  predniSONE (DELTASONE) 10 MG tablet, 4 tabs for 2 days, then 3 tabs for 2 days, 2 tabs for 2 days, then 1 tab for 2 days, then stop, Disp: 20 tablet, Rfl: 0 .  promethazine (PHENERGAN) 25 MG tablet, TAKE 1 TABLET(25 MG) BY MOUTH EVERY 6 HOURS AS NEEDED FOR NAUSEA OR VOMITING (Patient not taking: Reported on 05/30/2015), Disp: 30 tablet, Rfl: 1 .  traMADol (ULTRAM) 50 MG tablet, TAKE 1 TABLET BY MOUTH EVERY 6 HOURS AS NEEDED (Patient not taking: Reported on 05/30/2015), Disp: 60 tablet, Rfl: 2 .  venlafaxine XR (EFFEXOR-XR) 37.5 MG 24 hr capsule, TAKE 1 CAPSULE(37.5 MG) BY MOUTH DAILY WITH BREAKFAST, Disp: 30 capsule, Rfl: 0

## 2015-07-02 ENCOUNTER — Ambulatory Visit: Payer: Medicare Other | Admitting: Emergency Medicine

## 2015-07-03 ENCOUNTER — Telehealth: Payer: Self-pay | Admitting: Emergency Medicine

## 2015-07-03 NOTE — Telephone Encounter (Signed)
Spoke with pt. She is not doing better since seeing TP on 05/30/15. Still reports coughing and chest tightness. Advised her since she is not improving we will need to see her. She has been scheduled with RB on 07/05/15 at 2pm. Nothing further was needed.

## 2015-07-05 ENCOUNTER — Ambulatory Visit (INDEPENDENT_AMBULATORY_CARE_PROVIDER_SITE_OTHER): Payer: PPO | Admitting: Emergency Medicine

## 2015-07-05 ENCOUNTER — Ambulatory Visit (INDEPENDENT_AMBULATORY_CARE_PROVIDER_SITE_OTHER)
Admission: RE | Admit: 2015-07-05 | Discharge: 2015-07-05 | Disposition: A | Discharge status: Home / Self Care | Payer: PPO | Source: Ambulatory Visit | Attending: Emergency Medicine | Admitting: Emergency Medicine

## 2015-07-05 ENCOUNTER — Encounter: Payer: Self-pay | Admitting: Emergency Medicine

## 2015-07-05 VITALS — BP 104/62 | HR 78 | Wt 306.0 lb

## 2015-07-05 DIAGNOSIS — R059 Cough, unspecified: Secondary | ICD-10-CM

## 2015-07-05 DIAGNOSIS — R05 Cough: Secondary | ICD-10-CM | POA: Diagnosis not present

## 2015-07-05 DIAGNOSIS — G4733 Obstructive sleep apnea (adult) (pediatric): Secondary | ICD-10-CM

## 2015-07-05 DIAGNOSIS — J383 Other diseases of vocal cords: Secondary | ICD-10-CM

## 2015-07-05 DIAGNOSIS — R0609 Other forms of dyspnea: Secondary | ICD-10-CM

## 2015-07-05 DIAGNOSIS — R0602 Shortness of breath: Secondary | ICD-10-CM | POA: Diagnosis not present

## 2015-07-05 MED ORDER — HYDROCODONE-HOMATROPINE 5-1.5 MG/5ML PO SYRP: 5.0000 mL | ORAL_SOLUTION | Freq: Four times a day (QID) | ORAL | Status: DC | PRN

## 2015-07-05 NOTE — Assessment & Plan Note (Signed)
Currently unttreated - explained the relationship between this and her HA, her UA sx, her overall dysponea and R heart fxn. She needs to restart, need [facility] to check her machine before she can use it.

## 2015-07-05 NOTE — Patient Instructions (Addendum)
We need to get you back on your CPAP mask every night Please continue your loratadine, fluticasone nasal spray, singulair as you are taking them  Start doing nasal saline washes daily Use your albuterol nebulizer as needed for shortness of breath.  We will repeat your CXR today We will refill your Hydromet cough syrup.  Follow with Dr Lamonte Sakai in 3 months or sooner if you have any problems.

## 2015-07-05 NOTE — Progress Notes (Signed)
Subjective:    Patient ID: [name], female    DOB: Oct 27, 1950, 65 y.o.   MRN: [id number]  HPI 65 y.o. woman, former smoker with obesity, OSA, HTN on irbesartan, DM, childhood asthma. Carries a dx of adult asthma made several years ago. Referred for cough. She describes cough that has been prod of white phlegm, started after URI sx about 4 months ago. Describes a tickle in her throat. Can result in stress incontinence.   She has been on Advair in the past. Was recently changed to QVAR + albuterol, uses about 2-3 x a month. She is on omeprazole 20mg  twice a day. She still has occasional reflux sx > can reflux all the way up to throat. She has allergy and rhinitis sx frequently - singulair has been added recently and has helped her.   CXR 12/22/13 - normal.  We will perform full pulmonary function testing  We will change your omeprazole to nexium 40mg  twice a day until our next visit Continue your singulair Stop QVAR for now Keep albuterol available to use 2 puffs if needed for shortness of breath Start loratadine (Claritin) 10mg  daily Start fluticasone nasal spray, 2 sprays each nostril daily Follow with Dr Lamonte Sakai in 1 month  Acute OV 02/01/14 --  Returns for persistent cough. Seen 1 month ago. Seen last month for pulmonary consult for cough.  She is quite upset that her cough is no better and she can not go anywhere due to stress incontinence w/ coughing fits. Has been going on for ~5 months  On Lopressor 75mg  Twice daily   CXR w/ no acute process 12/22/13 .  Former smoker, PFT pending.  Last ov was changed from prilosec to nexium for better GERD control. She continues to have intermittent reflux.  QVAR was held due to no improvement in symptoms. Previous on Advair in past without help.  No fever, orthopnea, edema , n/v/d, discolored mucus. Does have some sinus drainage.  Has used tussionex with some help, but is out of this.   ROV 02/14/14 -- follows for cough, GERD, PND, suspected  asthma. I changed omeprazole to nexium, added loratdine, flonase. TP added pepcid, chlorpheniramine. Her cough has gotten better but has not gone away completely. She is still having. She has been dx w sleep apnea - she has not been able to tolerate CPAP.   PFT done today 02/14/14 > shows some spiro evidence for restriction but for the most part normal. Could not do volumes.    ROV 03/13/14 -- follow up for chronic cough, influenced by GERD and rhinitis. Her PFT are reassuring > little evidence for asthma. Treated recently by Dr Jenny Reichmann for a possible bronchitis and flare. Temporarily improved but then cough right back to where she started.  She is on high dose nexium, flonase, singulair, pepcid qhs.  She is on loratadine qd, not on chlorpheniramine. She is having some nasal congestion. She still has some mild GERD sx.   ROV 08/09/14 -- follow-up visit for chronic cough. She has a history of GERD and allergic rhinitis. We have not been able to establish a diagnosis of asthma based on her spirometry.She had been feeling well, fairly good cough control. Has been going to the speech therapists at [facility] with benefit. She is on singulair, pepcid, Nexium, neurontin, fluticasone, loratadine. She asks today about OSA and CPAP.   ROV 10/30/14 -- follow-up visit for chronic cough, upper airway disease, GERD allergic rhinitis. She has benefited from speech therapy, loratadine,  Singulair, Pepcid, omeprazole,  Neurontin. She has been dx with OSA and we started her on auto-set CPAP since her last visit.  She tells me today that she doesn't have the CPAP yet.  She tells me that she has been having more trouble with her cough since last several weeks. She is coughing up some beige mucous.    ROV 03/05/15 -- chronic cough, upper airway disease, GERD allergic rhinitis. She follows today after her device download > reviewed this today, shows that she has worn it some, 11 / 30 days (37% of the days). She is having trouble  tolerating the mask, but does feel that it helps her, makes her feel better the next day. She just had an exacerbation of her cough in the setting of a URI. Her voice is scratchy for the last several days. Her cough is stable since she completed a pred taper.   Acute OV 07/05/15 -- acute sick visit for history of upper airway disease, chronic cough. She has GERD and allergic rhinitis. Her pulmonary function testing from 02/14/14 is most consistent with restriction. Also with a history of obstructive sleep apnea. She was seen in December by Rexene Edison for apparent acute bronchitis that was treated with azithromycin and prednisone, Hydromet. She was treated again in late Dec with lasix, azithro and hydromet again. She presents today staring that these meds helped her, allowed her to sleep through the night. She is still having nasal gtt, chest tightness, cough. She is being awakened by UE pain, hand pain and neuropathy. She remains on nasal steroid, loratadine, singulair. She is on prilosec 40mg  daily. She is on neurontin.  She has albuterol nebs, uses occasionally, does help some.   She has not been using her CPAP mask for a few weeks, needs to discuss with [facility] to get repairs.    Review of Systems As per HPI     Objective:   Physical Exam Filed Vitals:   07/05/15 1415 07/05/15 1416  BP:  104/62  Pulse:  78  Weight: 306 lb (138.801 kg)   SpO2:  96%    GEN: A/Ox3; pleasant , NAD, obese   HEENT:  OP clear, hoarse voice.   NECK:  No stridor  RESP  Clear bilaterally  CARD:  RRR, no m/r/g, trace edema.   Musco: Warm bil, no deformities or joint swelling noted.   Neuro: alert, no focal deficits noted.    Skin: Warm, no lesions or rashes      Assessment & Plan:  Cough Most of her sx seem to relate to cough, rhinitis, UA irritation. She has associated dyspnea in absence of wheeze. Will try to add nasal washes to her existing regimen for allergies and gerd.   Vocal cord  dysfunction See cough section  Dyspnea on exertion Multifactorial - deconditioning, obesity, UA irritation. She ius at risk for decompensated R heart dysfxn when she doesn't use her CPAP.  CXR today. Maintenance treatments as described above  Obstructive sleep apnea Currently unttreated - explained the relationship between this and her HA, her UA sx, her overall dysponea and R heart fxn. She needs to restart, need [facility] to check her machine before she can use it.

## 2015-07-05 NOTE — Assessment & Plan Note (Addendum)
Multifactorial - deconditioning, obesity, UA irritation. She ius at risk for decompensated R heart dysfxn when she doesn't use her CPAP.  CXR today. Maintenance treatments as described above

## 2015-07-05 NOTE — Assessment & Plan Note (Signed)
See cough section

## 2015-07-05 NOTE — Assessment & Plan Note (Signed)
Most of her sx seem to relate to cough, rhinitis, UA irritation. She has associated dyspnea in absence of wheeze. Will try to add nasal washes to her existing regimen for allergies and gerd.

## 2015-07-06 ENCOUNTER — Other Ambulatory Visit: Payer: Self-pay | Admitting: Emergency Medicine

## 2015-07-09 ENCOUNTER — Telehealth: Payer: Self-pay | Admitting: Emergency Medicine

## 2015-07-09 NOTE — Telephone Encounter (Signed)
Called and spoke with patient. Reviewed RB's results and recs. Pt voiced understanding and had no further questions. Nothing further needed at this time.

## 2015-07-09 NOTE — Telephone Encounter (Signed)
Please let her know that her CXR shows that her lungs are fully recovered and that there is no evidence of pneumonia. I suspect that the blood in her mucous was due to inflammation from allergies and her prior infection either in her nose or in her bronchial tubes. I do not it suspect it to return now that she has been treated.

## 2015-07-09 NOTE — Telephone Encounter (Signed)
Spoke with the pt Pt requesting her cxr results from 07/05/15  She states that also, she forgot to mention she had some hemoptysis the day of her last visit  She has not had any since  She wants to know what could have caused this  Please advise, thanks!

## 2015-07-11 ENCOUNTER — Telehealth: Payer: Self-pay | Admitting: Emergency Medicine

## 2015-07-11 ENCOUNTER — Other Ambulatory Visit: Payer: Self-pay | Admitting: Emergency Medicine

## 2015-07-11 NOTE — Telephone Encounter (Signed)
Noted  

## 2015-07-13 ENCOUNTER — Observation Stay (HOSPITAL_COMMUNITY)
Admission: EM | Admit: 2015-07-13 | Discharge: 2015-07-16 | Disposition: A | Discharge status: Home / Self Care | Payer: PPO | Attending: Internal Medicine | Admitting: Internal Medicine

## 2015-07-13 ENCOUNTER — Emergency Department (HOSPITAL_COMMUNITY): Payer: PPO

## 2015-07-13 ENCOUNTER — Encounter (HOSPITAL_COMMUNITY): Payer: Self-pay | Admitting: Emergency Medicine

## 2015-07-13 DIAGNOSIS — R079 Chest pain, unspecified: Secondary | ICD-10-CM | POA: Diagnosis not present

## 2015-07-13 DIAGNOSIS — F329 Major depressive disorder, single episode, unspecified: Secondary | ICD-10-CM | POA: Diagnosis not present

## 2015-07-13 DIAGNOSIS — D649 Anemia, unspecified: Secondary | ICD-10-CM | POA: Insufficient documentation

## 2015-07-13 DIAGNOSIS — F419 Anxiety disorder, unspecified: Secondary | ICD-10-CM | POA: Diagnosis not present

## 2015-07-13 DIAGNOSIS — E559 Vitamin D deficiency, unspecified: Secondary | ICD-10-CM | POA: Diagnosis not present

## 2015-07-13 DIAGNOSIS — K219 Gastro-esophageal reflux disease without esophagitis: Secondary | ICD-10-CM | POA: Insufficient documentation

## 2015-07-13 DIAGNOSIS — E669 Obesity, unspecified: Secondary | ICD-10-CM | POA: Insufficient documentation

## 2015-07-13 DIAGNOSIS — Z87891 Personal history of nicotine dependence: Secondary | ICD-10-CM | POA: Diagnosis not present

## 2015-07-13 DIAGNOSIS — G8929 Other chronic pain: Secondary | ICD-10-CM | POA: Diagnosis not present

## 2015-07-13 DIAGNOSIS — J449 Chronic obstructive pulmonary disease, unspecified: Secondary | ICD-10-CM | POA: Insufficient documentation

## 2015-07-13 DIAGNOSIS — N959 Unspecified menopausal and perimenopausal disorder: Secondary | ICD-10-CM | POA: Diagnosis not present

## 2015-07-13 DIAGNOSIS — E1142 Type 2 diabetes mellitus with diabetic polyneuropathy: Secondary | ICD-10-CM | POA: Diagnosis not present

## 2015-07-13 DIAGNOSIS — E860 Dehydration: Secondary | ICD-10-CM | POA: Diagnosis not present

## 2015-07-13 DIAGNOSIS — I10 Essential (primary) hypertension: Secondary | ICD-10-CM | POA: Diagnosis not present

## 2015-07-13 DIAGNOSIS — Z7982 Long term (current) use of aspirin: Secondary | ICD-10-CM | POA: Diagnosis not present

## 2015-07-13 DIAGNOSIS — Z794 Long term (current) use of insulin: Secondary | ICD-10-CM | POA: Insufficient documentation

## 2015-07-13 DIAGNOSIS — Z79899 Other long term (current) drug therapy: Secondary | ICD-10-CM | POA: Diagnosis not present

## 2015-07-13 DIAGNOSIS — Z880 Allergy status to penicillin: Secondary | ICD-10-CM | POA: Insufficient documentation

## 2015-07-13 DIAGNOSIS — R0789 Other chest pain: Secondary | ICD-10-CM | POA: Diagnosis not present

## 2015-07-13 DIAGNOSIS — R0602 Shortness of breath: Secondary | ICD-10-CM | POA: Diagnosis not present

## 2015-07-13 LAB — CBC
HEMATOCRIT: 36.3 % (ref 36.0–46.0)
Hemoglobin: 11.7 g/dL — ABNORMAL LOW (ref 12.0–15.0)
MCH: 28.3 pg (ref 26.0–34.0)
MCHC: 32.2 g/dL (ref 30.0–36.0)
MCV: 87.7 fL (ref 78.0–100.0)
Platelets: 176 10*3/uL (ref 150–400)
RBC: 4.14 MIL/uL (ref 3.87–5.11)
RDW: 15.2 % (ref 11.5–15.5)
WBC: 5.7 10*3/uL (ref 4.0–10.5)

## 2015-07-13 LAB — DIFFERENTIAL
BASOS ABS: 0 10*3/uL (ref 0.0–0.1)
Basophils Relative: 0 %
Eosinophils Absolute: 0.2 10*3/uL (ref 0.0–0.7)
Eosinophils Relative: 3 %
LYMPHS ABS: 2.2 10*3/uL (ref 0.7–4.0)
LYMPHS PCT: 38 %
MONOS PCT: 7 %
Monocytes Absolute: 0.4 10*3/uL (ref 0.1–1.0)
NEUTROS PCT: 52 %
Neutro Abs: 2.9 10*3/uL (ref 1.7–7.7)

## 2015-07-13 LAB — I-STAT TROPONIN, ED: Troponin i, poc: 0 ng/mL (ref 0.00–0.08)

## 2015-07-13 LAB — BASIC METABOLIC PANEL
Anion gap: 13 (ref 5–15)
BUN: 16 mg/dL (ref 6–20)
CHLORIDE: 101 mmol/L (ref 101–111)
CO2: 27 mmol/L (ref 22–32)
Calcium: 9.7 mg/dL (ref 8.9–10.3)
Creatinine, Ser: 1.15 mg/dL — ABNORMAL HIGH (ref 0.44–1.00)
GFR calc Af Amer: 57 mL/min — ABNORMAL LOW (ref >60)
GFR calc non Af Amer: 49 mL/min — ABNORMAL LOW (ref >60)
Glucose, Bld: 279 mg/dL — ABNORMAL HIGH (ref 65–99)
POTASSIUM: 4.6 mmol/L (ref 3.5–5.1)
SODIUM: 141 mmol/L (ref 135–145)

## 2015-07-13 LAB — BRAIN NATRIURETIC PEPTIDE: B Natriuretic Peptide: 82.4 pg/mL (ref 0.0–100.0)

## 2015-07-13 MED ORDER — MORPHINE SULFATE (PF) 4 MG/ML IV SOLN
4.0000 mg | Freq: Once | INTRAVENOUS | Status: AC
  Administered 2015-07-14: 4 mg via INTRAVENOUS
  Filled 2015-07-13: qty 1

## 2015-07-13 NOTE — ED Notes (Signed)
Pt here with cp x 3 days or more. Pt reports recent dx of bronchitis. Pt received 324 ASA and 2 nitro with no significant relief. 8/10 pain at this time.

## 2015-07-13 NOTE — ED Provider Notes (Signed)
CSN: [id number]     Arrival date & time 07/13/15  2125 History   First MD Initiated Contact with Patient 07/13/15 2131     Chief Complaint  Patient presents with  . Cough  . Chest Pain     (Consider location/radiation/quality/duration/timing/severity/associated sxs/prior Treatment) HPI  65 year old female who presents with chest pain. History of MDD, obesity, HTN, and type II DM. Has history of chronic cough, and states that 1 month ago was treated for pneumonia. Still having baseline consistent cough, with some sputum production. However, over the past 3-4 days has had stabbing and pressure-like chest pain in the center of her chest. Constant, minimally improved with tylenol. Not worse with exertion, but seems to be exacerbated by movement. Not pleuritic. Associated with shortness of breath and dyspnea on exertion. No lower extremity edema, orthopnea, and PND. No history of PE/DVT, calf swelling or tenderness, recent immbolization, or strong family history of thromboembolic disease.  Past Medical History  Diagnosis Date  . Anemia, unspecified   . Dehydration   . Depressive disorder, not elsewhere classified   . Esophageal reflux   . Unspecified essential hypertension   . Unspecified menopausal and postmenopausal disorder   . Obesity, unspecified   . Inflammatory and toxic neuropathy, unspecified   . Unspecified vitamin D deficiency   . Dysphagia 2007    historyof dysphagia with severe dysmotility by barium swallow-[name]  . Obesity   . Hypertension   . Allergic bronchitis   . Complication of anesthesia     "I've had recall; I probably stopped breathing at least 2 times" (09/03/2014)  . Family history of adverse reaction to anesthesia     "my brother was told they lost him a couple times during OR"  . OSA (obstructive sleep apnea)     "stopped using my CPAP; just couldn't use it cause it kept me awake and I couldn't keep it on my face'"(09/03/2014)  . Heart murmur   . Chronic  airway obstruction, not elsewhere classified     "I was told I don't have this/tests done @ [facility] 05/2014"  . Extrinsic asthma, unspecified     "I was told I don't have this/tests done @ [facility] 05/2014"  . Type II diabetes mellitus ([place])   . History of hiatal hernia   . Headache     "weekly to monthly" (09/03/2014)  . Migraine     "couple times/month right now" (09/03/2014)  . Spondylosis of unspecified site without mention of myelopathy   . Arthritis     "neck, back, hands" (09/03/2014)  . Diabetic peripheral neuropathy ([place])   . Chronic back pain   . Allergy   . Anxiety    Past Surgical History  Procedure Laterality Date  . Refractive surgery Bilateral   . Anterior cervical decomp/discectomy fusion      multiple cervical spine levels;Dr. Arnoldo Morale  . Incision and drainage abscess      Chest  . Dilation and curettage of uterus    . Exploratory laparotomy    . Total knee arthroplasty Left 10/2003  . Total knee arthroplasty Right 01/2004    [name]  . Foot surgery Right X 2    "took blood out of my arms and put platelets in my feet"  . Appendectomy    . Tonsillectomy    . Esophagogastroduodenoscopy N/A 05/08/2014    Procedure: ESOPHAGOGASTRODUODENOSCOPY (EGD);  Surgeon: [name], MD;  Location: [name];  Service: Endoscopy;  Laterality: N/A;  .  Colonoscopy N/A 05/08/2014    Procedure: COLONOSCOPY;  Surgeon: [name], MD;  Location: [facility];  Service: Endoscopy;  Laterality: N/A;  . Back surgery    . Cholecystectomy open    . Abdominal hysterectomy      "partial"  . Eye surgery    . Joint replacement     Family History  Problem Relation Age of Onset  . Esophageal cancer Brother   . Esophageal cancer Sister     ?  . Colon polyps Brother   . Pancreatic cancer Sister     ?  . Diabetes Sister   . Diabetes      Aunt and Uncle  . Heart disease Maternal Grandfather    Social History  Substance Use Topics  . Smoking status: Former Smoker -- 0.00  packs/day for 0 years    Types: Cigarettes    Quit date: 06/22/1986  . Smokeless tobacco: Never Used  . Alcohol Use: Yes   OB History    No data available     Review of Systems 10/14 systems reviewed and are negative other than those stated in the HPI   Allergies  Ace inhibitors; Penicillins; Adhesive; and Tape  Home Medications   Prior to Admission medications   Medication Sig Start Date End Date Taking? Authorizing Provider  albuterol (PROVENTIL) (2.5 MG/3ML) 0.083% nebulizer solution Take 2.5 mg by nebulization every 6 (six) hours as needed for wheezing or shortness of breath.   Yes Historical Provider, MD  allopurinol (ZYLOPRIM) 300 MG tablet Take 1 tablet (300 mg total) by mouth daily. 04/04/15  Yes Lyndal Pulley, DO  aspirin 81 MG tablet Take 81 mg by mouth at bedtime.    Yes Historical Provider, MD  cholecalciferol (VITAMIN D) 1000 UNITS tablet Take 2,000 Units by mouth every morning.    Yes Historical Provider, MD  famotidine (PEPCID) 20 MG tablet TAKE 1 TABLET BY MOUTH EVERY NIGHT AT BEDTIME 03/26/15  Yes Collene Gobble, MD  fluticasone (FLONASE) 50 MCG/ACT nasal spray Place 2 sprays into both nostrils every morning. 04/26/14  Yes Collene Gobble, MD  folic acid (FOLVITE) 1 MG tablet Take 4 tablets (4 mg total) by mouth daily. 02/20/15  Yes Renato Shin, MD  gabapentin (NEURONTIN) 800 MG tablet Take 1 tablet (800 mg total) by mouth 3 (three) times daily. 02/27/15  Yes Biagio Borg, MD  glucosamine-chondroitin 500-400 MG tablet Take 1 tablet by mouth 3 (three) times daily.   Yes Historical Provider, MD  HYDROcodone-homatropine (HYDROMET) 5-1.5 MG/5ML syrup Take 5 mLs by mouth every 6 (six) hours as needed. 07/05/15  Yes Collene Gobble, MD  insulin NPH Human (HUMULIN N) 100 UNIT/ML injection Inject 1.7 mLs (170 Units total) into the skin daily before breakfast. And syringes 2/day Patient taking differently: Inject 140 Units into the skin daily before breakfast. And syringes 2/day  05/22/15  Yes Renato Shin, MD  irbesartan (AVAPRO) 300 MG tablet Take 1 tablet (300 mg total) by mouth daily. 02/18/15  Yes Biagio Borg, MD  loratadine (CLARITIN) 10 MG tablet TAKE 1 TABLET BY MOUTH EVERY DAY 07/11/15  Yes Collene Gobble, MD  metoprolol (LOPRESSOR) 50 MG tablet Take 1.5 tablets (75 mg total) by mouth 2 (two) times daily. 12/17/14  Yes Biagio Borg, MD  montelukast (SINGULAIR) 10 MG tablet TAKE 1 TABLET BY MOUTH DAILY 03/04/15  Yes Biagio Borg, MD  nystatin cream (MYCOSTATIN) APPLY TO AFFECTED AREA TWICE DAILY Patient taking differently: APPLY  TO AFFECTED AREA TWICE DAILY AS NEEDED FOR IRRITATION 11/21/14  Yes Biagio Borg, MD  Omega-3 Fatty Acids (FISH OIL) 1200 MG CPDR Take 1,200 mg by mouth daily.   Yes Historical Provider, MD  omeprazole (PRILOSEC) 40 MG capsule TAKE 1 CAPSULE(40 MG) BY MOUTH DAILY 04/25/15  Yes Collene Gobble, MD  oxybutynin (DITROPAN-XL) 10 MG 24 hr tablet Take 1 tablet (10 mg total) by mouth daily. 02/18/15  Yes Biagio Borg, MD  cyclobenzaprine (FLEXERIL) 5 MG tablet Take 1 tablet (5 mg total) by mouth 3 (three) times daily as needed for muscle spasms. 03/07/15   Lyndal Pulley, DO  glucose blood (ONE TOUCH ULTRA TEST) test strip USE TO TEST BLOOD SUGAR TWICE DAILY. DX: E10.41 05/15/15   Renato Shin, MD  Lancets [facility] ULTRASOFT) lancets Use to test blood sugars twice a day. 07/20/13   Renato Shin, MD  ondansetron (ZOFRAN) 4 MG tablet Take 1 tablet (4 mg total) by mouth every 8 (eight) hours as needed for nausea or vomiting. 02/08/15   Biagio Borg, MD  venlafaxine XR (EFFEXOR-XR) 37.5 MG 24 hr capsule TAKE 1 CAPSULE(37.5 MG) BY MOUTH DAILY WITH BREAKFAST 06/10/15   Lyndal Pulley, DO   BP 145/78 mmHg  Pulse 78  Temp(Src) 98 F (36.7 C) (Oral)  Resp 17  Ht 5' 4.25" (1.632 m)  Wt 306 lb (138.801 kg)  BMI 52.11 kg/m2  SpO2 99% Physical Exam Physical Exam  Nursing note and vitals reviewed. Constitutional: Well developed, well nourished, non-toxic, and  in no acute distress Head: Normocephalic and atraumatic.  Mouth/Throat: Oropharynx is clear and moist.  Neck: Normal range of motion. Neck supple.  Cardiovascular: Normal rate and regular rhythm.   Pulmonary/Chest: Effort normal and breath sounds normal. No significant chest wall tenderness. Abdominal: Soft. Obese There is no tenderness. There is no rebound and no guarding.  Musculoskeletal: Normal range of motion.  Neurological: Alert, no facial droop, fluent speech, moves all extremities symmetrically Skin: Skin is warm and dry.  Psychiatric: Cooperative  ED Course  Procedures (including critical care time) Labs Review Labs Reviewed  BASIC METABOLIC PANEL - Abnormal; Notable for the following:    Glucose, Bld 279 (*)    Creatinine, Ser 1.15 (*)    GFR calc non Af Amer 49 (*)    GFR calc Af Amer 57 (*)    All other components within normal limits  CBC - Abnormal; Notable for the following:    Hemoglobin 11.7 (*)    All other components within normal limits  DIFFERENTIAL  BRAIN NATRIURETIC PEPTIDE  I-STAT TROPOININ, ED    Imaging Review Dg Chest 2 View  07/13/2015  CLINICAL DATA:  Chest pain shortness of breath for 3 days hypertension former smoker EXAM: CHEST  2 VIEW COMPARISON:  07/05/2015, 06/18/2015, 09/02/2014 FINDINGS: Mild cardiac enlargement stable. Bilateral perihilar bronchitic change stable. Right lung is clear. Minimal lingular infiltrate. IMPRESSION: Minimal residual lingular infiltrate consistent with resolving pneumonia. Suggest follow-up in 2-4 weeks to ensure complete resolution. Electronically Signed   By: Skipper Cliche M.D.   On: 07/13/2015 22:46   I have personally reviewed and evaluated these images and lab results as part of my medical decision-making.   EKG Interpretation   Date/Time:  Saturday July 13 2015 21:27:34 EST Ventricular Rate:  74 PR Interval:  172 QRS Duration: 69 QT Interval:  433 QTC Calculation: 480 R Axis:   18 Text  Interpretation:  Sinus rhythm Ventricular premature complex  Nonspecific  repol abnormality, diffuse leads No significant change since  last tracing Confirmed by Javid Kemler MD, Lillybeth Tal [phone]) on 07/13/2015 9:54:00 PM      MDM   Final diagnoses:  Chest pain, unspecified chest pain type   65 year old female who presents with chest pain. Vital signs are stable on arrival. Has chest pain that is worse with movement, but not fully reproducible on exam. Seems more MSK, and pateint states pain consistent with MSK chest pain that she has had several years ago, but this episode much more severe. EKG is nonischemic. Troponin is negative, and the setting of ongoing chest pain for 3-4 days, does not seem to be ischemic in nature. Chest x-ray showing resolving lingular pneumonia, and no other acute cardiopulmonary processes. He has a heart score of 4 due to her increased risk factors, age, and EKG showing nonspecific repolarization abnormalities pain seems atypical for that of ACS, but she is at higher risk. The patient also with no primary care, and would have potentially poor follow-up. I discussed this patient with [facility] hospitalist, who will admit patient for cardiac rule out under observation telemetry.     Forde Dandy, MD 07/14/15 [phone]

## 2015-07-14 ENCOUNTER — Observation Stay (HOSPITAL_COMMUNITY): Payer: PPO

## 2015-07-14 DIAGNOSIS — R079 Chest pain, unspecified: Secondary | ICD-10-CM | POA: Diagnosis present

## 2015-07-14 DIAGNOSIS — F419 Anxiety disorder, unspecified: Secondary | ICD-10-CM | POA: Diagnosis not present

## 2015-07-14 DIAGNOSIS — D649 Anemia, unspecified: Secondary | ICD-10-CM | POA: Diagnosis not present

## 2015-07-14 DIAGNOSIS — E860 Dehydration: Secondary | ICD-10-CM | POA: Diagnosis not present

## 2015-07-14 LAB — GLUCOSE, CAPILLARY
GLUCOSE-CAPILLARY: 218 mg/dL — AB (ref 65–99)
Glucose-Capillary: 88 mg/dL (ref 65–99)
Glucose-Capillary: 166 mg/dL — ABNORMAL HIGH (ref 65–99)
Glucose-Capillary: 171 mg/dL — ABNORMAL HIGH (ref 65–99)

## 2015-07-14 LAB — MRSA PCR SCREENING: MRSA by PCR: NEGATIVE

## 2015-07-14 LAB — TROPONIN I

## 2015-07-14 LAB — I-STAT TROPONIN, ED: Troponin i, poc: 0 ng/mL (ref 0.00–0.08)

## 2015-07-14 MED ORDER — CYCLOBENZAPRINE HCL 10 MG PO TABS
5.0000 mg | ORAL_TABLET | Freq: Three times a day (TID) | ORAL | Status: DC | PRN
Start: 2015-07-14 — End: 2015-07-16
  Administered 2015-07-14 – 2015-07-15 (×2): 5 mg via ORAL
  Filled 2015-07-14 (×2): qty 1

## 2015-07-14 MED ORDER — FAMOTIDINE 20 MG PO TABS
20.0000 mg | ORAL_TABLET | Freq: Every day | ORAL | Status: DC
  Administered 2015-07-14 – 2015-07-15 (×3): 20 mg via ORAL
  Filled 2015-07-14 (×3): qty 1

## 2015-07-14 MED ORDER — IRBESARTAN 300 MG PO TABS
300.0000 mg | ORAL_TABLET | Freq: Every day | ORAL | Status: DC
  Administered 2015-07-14 – 2015-07-16 (×3): 300 mg via ORAL
  Filled 2015-07-14 (×2): qty 2
  Filled 2015-07-14: qty 1
  Filled 2015-07-14: qty 2
  Filled 2015-07-14 (×2): qty 1

## 2015-07-14 MED ORDER — REGADENOSON 0.4 MG/5ML IV SOLN
0.4000 mg | Freq: Once | INTRAVENOUS | Status: AC
  Administered 2015-07-14: 0.4 mg via INTRAVENOUS
  Filled 2015-07-14: qty 5

## 2015-07-14 MED ORDER — ALLOPURINOL 300 MG PO TABS
300.0000 mg | ORAL_TABLET | Freq: Every day | ORAL | Status: DC
Start: 2015-07-14 — End: 2015-07-16
  Administered 2015-07-14 – 2015-07-16 (×3): 300 mg via ORAL
  Filled 2015-07-14 (×3): qty 1

## 2015-07-14 MED ORDER — LEVOFLOXACIN 750 MG PO TABS
750.0000 mg | ORAL_TABLET | Freq: Every day | ORAL | Status: DC
  Administered 2015-07-14 – 2015-07-16 (×3): 750 mg via ORAL
  Filled 2015-07-14 (×3): qty 1

## 2015-07-14 MED ORDER — ALBUTEROL SULFATE (2.5 MG/3ML) 0.083% IN NEBU: 2.5000 mg | INHALATION_SOLUTION | Freq: Four times a day (QID) | RESPIRATORY_TRACT | Status: DC | PRN

## 2015-07-14 MED ORDER — MONTELUKAST SODIUM 10 MG PO TABS
10.0000 mg | ORAL_TABLET | Freq: Every day | ORAL | Status: DC
  Administered 2015-07-14 – 2015-07-16 (×3): 10 mg via ORAL
  Filled 2015-07-14 (×3): qty 1

## 2015-07-14 MED ORDER — INSULIN NPH (HUMAN) (ISOPHANE) 100 UNIT/ML ~~LOC~~ SUSP
140.0000 [IU] | Freq: Every day | SUBCUTANEOUS | Status: DC
  Administered 2015-07-14 – 2015-07-16 (×2): 140 [IU] via SUBCUTANEOUS
  Filled 2015-07-14: qty 10

## 2015-07-14 MED ORDER — ASPIRIN EC 81 MG PO TBEC
81.0000 mg | DELAYED_RELEASE_TABLET | Freq: Every day | ORAL | Status: DC
  Administered 2015-07-14 – 2015-07-15 (×2): 81 mg via ORAL
  Filled 2015-07-14 (×2): qty 1

## 2015-07-14 MED ORDER — OXYBUTYNIN CHLORIDE ER 10 MG PO TB24
10.0000 mg | ORAL_TABLET | Freq: Every day | ORAL | Status: DC
  Administered 2015-07-14 – 2015-07-15 (×2): 10 mg via ORAL
  Filled 2015-07-14 (×3): qty 1

## 2015-07-14 MED ORDER — INSULIN ASPART 100 UNIT/ML ~~LOC~~ SOLN
0.0000 [IU] | Freq: Three times a day (TID) | SUBCUTANEOUS | Status: DC
  Administered 2015-07-14 (×2): 2 [IU] via SUBCUTANEOUS
  Administered 2015-07-15: 1 [IU] via SUBCUTANEOUS
  Administered 2015-07-15: 2 [IU] via SUBCUTANEOUS
  Administered 2015-07-16 (×2): 3 [IU] via SUBCUTANEOUS

## 2015-07-14 MED ORDER — KETOROLAC TROMETHAMINE 30 MG/ML IJ SOLN
15.0000 mg | Freq: Four times a day (QID) | INTRAMUSCULAR | Status: DC | PRN
  Administered 2015-07-14 – 2015-07-15 (×2): 15 mg via INTRAVENOUS
  Filled 2015-07-14 (×2): qty 1

## 2015-07-14 MED ORDER — GABAPENTIN 400 MG PO CAPS
800.0000 mg | ORAL_CAPSULE | Freq: Three times a day (TID) | ORAL | Status: DC
  Administered 2015-07-14 – 2015-07-16 (×9): 800 mg via ORAL
  Filled 2015-07-14 (×2): qty 2
  Filled 2015-07-14: qty 8
  Filled 2015-07-14 (×6): qty 2

## 2015-07-14 MED ORDER — MORPHINE SULFATE (PF) 4 MG/ML IV SOLN
4.0000 mg | INTRAVENOUS | Status: DC | PRN
  Administered 2015-07-14 (×2): 4 mg via INTRAVENOUS
  Filled 2015-07-14 (×2): qty 1

## 2015-07-14 MED ORDER — METOPROLOL TARTRATE 50 MG PO TABS
75.0000 mg | ORAL_TABLET | Freq: Two times a day (BID) | ORAL | Status: DC
Start: 2015-07-14 — End: 2015-07-16
  Administered 2015-07-14 – 2015-07-16 (×6): 75 mg via ORAL
  Filled 2015-07-14 (×6): qty 1

## 2015-07-14 MED ORDER — GLUCOSAMINE-CHONDROITIN 500-400 MG PO TABS: 1.0000 | ORAL_TABLET | Freq: Three times a day (TID) | ORAL | Status: DC

## 2015-07-14 MED ORDER — NITROGLYCERIN 0.4 MG SL SUBL
0.4000 mg | SUBLINGUAL_TABLET | SUBLINGUAL | Status: DC | PRN
  Administered 2015-07-14: 0.4 mg via SUBLINGUAL

## 2015-07-14 MED ORDER — ONDANSETRON HCL 4 MG PO TABS: 4.0000 mg | ORAL_TABLET | Freq: Three times a day (TID) | ORAL | Status: DC | PRN

## 2015-07-14 MED ORDER — NITROGLYCERIN 0.4 MG SL SUBL
0.4000 mg | SUBLINGUAL_TABLET | SUBLINGUAL | Status: DC | PRN
  Filled 2015-07-14 (×2): qty 1

## 2015-07-14 MED ORDER — PANTOPRAZOLE SODIUM 40 MG PO TBEC
40.0000 mg | DELAYED_RELEASE_TABLET | Freq: Every day | ORAL | Status: DC
  Administered 2015-07-14 – 2015-07-16 (×3): 40 mg via ORAL
  Filled 2015-07-14 (×3): qty 1

## 2015-07-14 MED ORDER — TECHNETIUM TC 99M SESTAMIBI - CARDIOLITE
30.0000 | Freq: Once | INTRAVENOUS | Status: AC | PRN
  Administered 2015-07-14: 30 via INTRAVENOUS

## 2015-07-14 MED ORDER — REGADENOSON 0.4 MG/5ML IV SOLN
INTRAVENOUS | Status: AC
  Filled 2015-07-14: qty 5

## 2015-07-14 MED ORDER — ENOXAPARIN SODIUM 40 MG/0.4ML ~~LOC~~ SOLN
40.0000 mg | Freq: Every day | SUBCUTANEOUS | Status: DC
  Administered 2015-07-14 – 2015-07-16 (×3): 40 mg via SUBCUTANEOUS
  Filled 2015-07-14 (×3): qty 0.4

## 2015-07-14 NOTE — Progress Notes (Addendum)
   Alexxus Mellody Memos presented for a lexiscan cardiolite today.  No immediate complications. Today is day 1 of 2.  Stress imaging pending - to be completed 1/23.  Murray Hodgkins, NP 07/14/2015, 10:27 AM

## 2015-07-14 NOTE — H&P (Signed)
History and Physical  Christy May [id number] DOB: Sep 14, 1950 DOA: 07/13/2015  PCP: [name], MD   Chief Complaint: chest pain   History of Present Illness:  - Patient is a 65 yo female with history of HTN, DMII, 20 PY smoking history who came with cc of chest pain that has been constant in substernal area for 3 days, relieved partially by tylenol, positional, non exertional, similar to a pain she had several years ago after surgery on her back. She has had dyspnea and cough productive of clear sputum but was diagnosed with pneumonia a month ago and had two Zpack courses and prednisone as she did not have complete resolution. She finished her course of abx 1 week ago. She had hemoptysis when she was diagnosed with pneumonia initially that has resolved. However, today she had nasal bleeding when she sneezed that she relates to her being on CPAP/ dry nose. She denied N/V/D/C/abd pain. No headache or focal neurological signs.   Review of Systems:  CONSTITUTIONAL:  No night sweats.  No fatigue, malaise, lethargy.  No fever or chills. Eyes:  No visual changes.  No eye pain.  No eye discharge.   ENT:    No epistaxis.  No sinus pain.  No sore throat.  No ear pain.  No congestion. RESPIRATORY:  +cough.  No wheeze.  No hemoptysis.  ++shortness of breath. CARDIOVASCULAR:  +chest pains.  No palpitations. GASTROINTESTINAL:  No abdominal pain.  No nausea or vomiting.  No diarrhea or constipation.  No hematemesis.  No hematochezia.  No melena. GENITOURINARY:  No urgency.  No frequency.  No dysuria.  No hematuria.  No obstructive symptoms.  No discharge.  No pain.  No significant abnormal bleeding. MUSCULOSKELETAL:  No musculoskeletal pain.  No joint swelling.  No arthritis. NEUROLOGICAL:  No confusion.  No weakness. No headache. No seizure. PSYCHIATRIC:  No depression. No anxiety. No suicidal ideation. SKIN:  No rashes.  No lesions.  No wounds. ENDOCRINE:  No unexplained weight loss.   No polydipsia.  No polyuria.  No polyphagia. HEMATOLOGIC:  No anemia.  No purpura.  No petechiae.  No bleeding.  ALLERGIC AND IMMUNOLOGIC:  No pruritus.  No swelling Other:  Past Medical and Surgical History:   Past Medical History  Diagnosis Date  . Anemia, unspecified   . Dehydration   . Depressive disorder, not elsewhere classified   . Esophageal reflux   . Unspecified essential hypertension   . Unspecified menopausal and postmenopausal disorder   . Obesity, unspecified   . Inflammatory and toxic neuropathy, unspecified   . Unspecified vitamin D deficiency   . Dysphagia 2007    historyof dysphagia with severe dysmotility by barium swallow-[name]  . Obesity   . Hypertension   . Allergic bronchitis   . Complication of anesthesia     "I've had recall; I probably stopped breathing at least 2 times" (09/03/2014)  . Family history of adverse reaction to anesthesia     "my brother was told they lost him a couple times during OR"  . OSA (obstructive sleep apnea)     "stopped using my CPAP; just couldn't use it cause it kept me awake and I couldn't keep it on my face'"(09/03/2014)  . Heart murmur   . Chronic airway obstruction, not elsewhere classified     "I was told I don't have this/tests done @ [facility] 05/2014"  . Extrinsic asthma, unspecified     "I was told I don't have this/tests done @  [facility] 05/2014"  . Type II diabetes mellitus ([place])   . History of hiatal hernia   . Headache     "weekly to monthly" (09/03/2014)  . Migraine     "couple times/month right now" (09/03/2014)  . Spondylosis of unspecified site without mention of myelopathy   . Arthritis     "neck, back, hands" (09/03/2014)  . Diabetic peripheral neuropathy ([place])   . Chronic back pain   . Allergy   . Anxiety    Past Surgical History  Procedure Laterality Date  . Refractive surgery Bilateral   . Anterior cervical decomp/discectomy fusion      multiple cervical spine levels;Dr. Arnoldo Morale  . Incision  and drainage abscess      Chest  . Dilation and curettage of uterus    . Exploratory laparotomy    . Total knee arthroplasty Left 10/2003  . Total knee arthroplasty Right 01/2004    [name]  . Foot surgery Right X 2    "took blood out of my arms and put platelets in my feet"  . Appendectomy    . Tonsillectomy    . Esophagogastroduodenoscopy N/A 05/08/2014    Procedure: ESOPHAGOGASTRODUODENOSCOPY (EGD);  Surgeon: [name], MD;  Location: [name];  Service: Endoscopy;  Laterality: N/A;  . Colonoscopy N/A 05/08/2014    Procedure: COLONOSCOPY;  Surgeon: [name], MD;  Location: [facility];  Service: Endoscopy;  Laterality: N/A;  . Back surgery    . Cholecystectomy open    . Abdominal hysterectomy      "partial"  . Eye surgery    . Joint replacement      Social History:   reports that she quit smoking about 29 years ago. Her smoking use included Cigarettes. She smoked 0.00 packs per day for 0 years. She has never used smokeless tobacco. She reports that she drinks alcohol. She reports that she does not use illicit drugs.   Allergies  Allergen Reactions  . Ace Inhibitors Anaphylaxis  . Penicillins Hives and Rash  . Adhesive [Tape] Rash  . Tape Hives    Paper tape is ok to use    Family History  Problem Relation Age of Onset  . Esophageal cancer Brother   . Esophageal cancer Sister     ?  . Colon polyps Brother   . Pancreatic cancer Sister     ?  . Diabetes Sister   . Diabetes      Aunt and Uncle  . Heart disease Maternal Grandfather       Prior to Admission medications   Medication Sig Start Date End Date Taking? Authorizing Provider  albuterol (PROVENTIL) (2.5 MG/3ML) 0.083% nebulizer solution Take 2.5 mg by nebulization every 6 (six) hours as needed for wheezing or shortness of breath.   Yes Historical Provider, MD  allopurinol (ZYLOPRIM) 300 MG tablet Take 1 tablet (300 mg total) by mouth daily. 04/04/15  Yes Lyndal Pulley, DO  aspirin 81 MG  tablet Take 81 mg by mouth at bedtime.    Yes Historical Provider, MD  cholecalciferol (VITAMIN D) 1000 UNITS tablet Take 2,000 Units by mouth every morning.    Yes Historical Provider, MD  famotidine (PEPCID) 20 MG tablet TAKE 1 TABLET BY MOUTH EVERY NIGHT AT BEDTIME 03/26/15  Yes Collene Gobble, MD  fluticasone (FLONASE) 50 MCG/ACT nasal spray Place 2 sprays into both nostrils every morning. 04/26/14  Yes Collene Gobble, MD  folic acid (FOLVITE) 1 MG tablet Take 4 tablets (  4 mg total) by mouth daily. 02/20/15  Yes Renato Shin, MD  gabapentin (NEURONTIN) 800 MG tablet Take 1 tablet (800 mg total) by mouth 3 (three) times daily. 02/27/15  Yes Biagio Borg, MD  glucosamine-chondroitin 500-400 MG tablet Take 1 tablet by mouth 3 (three) times daily.   Yes Historical Provider, MD  HYDROcodone-homatropine (HYDROMET) 5-1.5 MG/5ML syrup Take 5 mLs by mouth every 6 (six) hours as needed. 07/05/15  Yes Collene Gobble, MD  insulin NPH Human (HUMULIN N) 100 UNIT/ML injection Inject 1.7 mLs (170 Units total) into the skin daily before breakfast. And syringes 2/day Patient taking differently: Inject 140 Units into the skin daily before breakfast. And syringes 2/day 05/22/15  Yes Renato Shin, MD  irbesartan (AVAPRO) 300 MG tablet Take 1 tablet (300 mg total) by mouth daily. 02/18/15  Yes Biagio Borg, MD  loratadine (CLARITIN) 10 MG tablet TAKE 1 TABLET BY MOUTH EVERY DAY 07/11/15  Yes Collene Gobble, MD  metoprolol (LOPRESSOR) 50 MG tablet Take 1.5 tablets (75 mg total) by mouth 2 (two) times daily. 12/17/14  Yes Biagio Borg, MD  montelukast (SINGULAIR) 10 MG tablet TAKE 1 TABLET BY MOUTH DAILY 03/04/15  Yes Biagio Borg, MD  nystatin cream (MYCOSTATIN) APPLY TO AFFECTED AREA TWICE DAILY Patient taking differently: APPLY TO AFFECTED AREA TWICE DAILY AS NEEDED FOR IRRITATION 11/21/14  Yes Biagio Borg, MD  Omega-3 Fatty Acids (FISH OIL) 1200 MG CPDR Take 1,200 mg by mouth daily.   Yes Historical Provider, MD  omeprazole  (PRILOSEC) 40 MG capsule TAKE 1 CAPSULE(40 MG) BY MOUTH DAILY 04/25/15  Yes Collene Gobble, MD  oxybutynin (DITROPAN-XL) 10 MG 24 hr tablet Take 1 tablet (10 mg total) by mouth daily. 02/18/15  Yes Biagio Borg, MD  cyclobenzaprine (FLEXERIL) 5 MG tablet Take 1 tablet (5 mg total) by mouth 3 (three) times daily as needed for muscle spasms. 03/07/15   Lyndal Pulley, DO  glucose blood (ONE TOUCH ULTRA TEST) test strip USE TO TEST BLOOD SUGAR TWICE DAILY. DX: E10.41 05/15/15   Renato Shin, MD  Lancets [facility] ULTRASOFT) lancets Use to test blood sugars twice a day. 07/20/13   Renato Shin, MD  ondansetron (ZOFRAN) 4 MG tablet Take 1 tablet (4 mg total) by mouth every 8 (eight) hours as needed for nausea or vomiting. 02/08/15   Biagio Borg, MD  venlafaxine XR (EFFEXOR-XR) 37.5 MG 24 hr capsule TAKE 1 CAPSULE(37.5 MG) BY MOUTH DAILY WITH BREAKFAST 06/10/15   Lyndal Pulley, DO    Physical Exam: BP 145/78 mmHg  Pulse 78  Temp(Src) 98 F (36.7 C) (Oral)  Resp 17  Ht 5' 4.25" (1.632 m)  Wt 138.801 kg (306 lb)  BMI 52.11 kg/m2  SpO2 99%  GENERAL : Well developed, well nourished, alert and cooperative, and appears to be in no acute distress. HEAD: normocephalic. EYES: vision is grossly intact. EARS:  hearing grossly intact. NECK: Neck supple. CARDIAC: Normal S1 and S2. No S3, S4 or murmurs. Rhythm is regular. There is no peripheral edema,. LUNGS: Clear to auscultation  ABDOMEN: Positive bowel sounds. Soft, nondistended, nontender. No guarding or rebound. No masses. NEUROLOGICAL: The mental examination revealed the patient was oriented to person, place, and time.CN II-XII intact. SKIN: Skin normal color, PSYCHIATRIC:  The patient was able to demonstrate good judgement and reason, without hallucinations, abnormal affect or abnormal behaviors during the examination. Patient is not suicidal.  Labs on Admission:  Reviewed.   Radiological Exams on Admission: Dg Chest 2  View  07/13/2015  CLINICAL DATA:  Chest pain shortness of breath for 3 days hypertension former smoker EXAM: CHEST  2 VIEW COMPARISON:  07/05/2015, 06/18/2015, 09/02/2014 FINDINGS: Mild cardiac enlargement stable. Bilateral perihilar bronchitic change stable. Right lung is clear. Minimal lingular infiltrate. IMPRESSION: Minimal residual lingular infiltrate consistent with resolving pneumonia. Suggest follow-up in 2-4 weeks to ensure complete resolution. Electronically Signed   By: Skipper Cliche M.D.   On: 07/13/2015 22:46    EKG:  Independently reviewed. No T/ST ischemic changes  Assessment/Plan  Chest pain:  Atypical , possibly MSK pain ( positional , constant, reproducible by exam).  Due to multiple risk factors will r/o ACS with serial trops/EKG May  Need stress test in am ( NPO after MN). She said she had LHC 10 years ago that was neg.  Will check Echo Tylenol or Norco for mild or severe pain respectively.  Cont aspirin  Resolving pneumonia:  Likely she has h/o asthma and needs to continue neb treatment as needed.  May need outpt eval with inhaled steroids  She has h/o asthma as a kid and 65 PY smoking history May need CT scan for recurrent pneumonia: review with radiology in am  DM: cont home meds with low dose correction  HTN: cont home meds  CKD: cr at baseline   DVT prophylaxis: [place] enoxaparin  GI prophylaxis: PPI Consultants: cardio in am Code Status: Full     Gennaro Africa M.D [facility]

## 2015-07-14 NOTE — Progress Notes (Signed)
Since patient is not being followed by cardiology, primary team will need to f/u on results tomorrow. Due to weight this is a 2 day study with the results pending for tomorrow. Please call with questions. Dayna Dunn PA-C

## 2015-07-14 NOTE — Progress Notes (Signed)
PHARMACIST - PHYSICIAN ORDER COMMUNICATION  CONCERNING: P&T Medication Policy on Herbal Medications  DESCRIPTION:  This patient's order for:  Glucosamine-Chondroitin  has been noted.  This product(s) is classified as an "herbal" or natural product. Due to a lack of definitive safety studies or FDA approval, nonstandard manufacturing practices, plus the potential risk of unknown drug-drug interactions while on inpatient medications, the Pharmacy and Therapeutics Committee does not permit the use of "herbal" or natural products of this type within [facility].   ACTION TAKEN: The pharmacy department is unable to verify this order at this time and your patient has been informed of this safety policy. Please reevaluate patient's clinical condition at discharge and address if the herbal or natural product(s) should be resumed at that time.   

## 2015-07-14 NOTE — Progress Notes (Signed)
Patient complained of 7/10 chest pain this morning at 0830, She refused sublingual nitroglycerine, because per patient it increases her heart rate. Morphine 4 mg IV given at 0837 and was effective. Pain down 2/10. Patient is currently having a stress test done.

## 2015-07-14 NOTE — Progress Notes (Signed)
Patient complained of chest pain 6/10, and continues to refuse nitroglycerine. PRN Morphine 4mg  given @ 1734. Patient could not rate pain at this time. She states "its still there, but getting better". Will continue to monitor patient.

## 2015-07-14 NOTE — Progress Notes (Signed)
Patient admitted earlier this am . Please see Dr Heron Sabins note in detail .  65 year old with h/o hypertension, Diabetes mellitus, admitted for chest pain. Cardiology consulted and she underwent first part of stress test today. CXR shows residual lingular infiltrate consistent with resolving pneumonia. Restarted her on levaquin. She reports chest pain with certain movements, suspect musculoskeletal pain. Added on toradol.   Continue to monitor. Hosie Poisson, MD [phone]

## 2015-07-15 ENCOUNTER — Encounter (HOSPITAL_COMMUNITY): Payer: Self-pay

## 2015-07-15 ENCOUNTER — Telehealth: Payer: Self-pay | Admitting: Emergency Medicine

## 2015-07-15 ENCOUNTER — Encounter (HOSPITAL_COMMUNITY): Payer: PPO

## 2015-07-15 ENCOUNTER — Ambulatory Visit (HOSPITAL_BASED_OUTPATIENT_CLINIC_OR_DEPARTMENT_OTHER): Payer: PPO

## 2015-07-15 DIAGNOSIS — R079 Chest pain, unspecified: Secondary | ICD-10-CM | POA: Diagnosis not present

## 2015-07-15 DIAGNOSIS — R0782 Intercostal pain: Secondary | ICD-10-CM | POA: Diagnosis not present

## 2015-07-15 DIAGNOSIS — R0602 Shortness of breath: Secondary | ICD-10-CM | POA: Diagnosis not present

## 2015-07-15 LAB — GLUCOSE, CAPILLARY
GLUCOSE-CAPILLARY: 92 mg/dL (ref 65–99)
GLUCOSE-CAPILLARY: 136 mg/dL — AB (ref 65–99)
GLUCOSE-CAPILLARY: 272 mg/dL — AB (ref 65–99)
Glucose-Capillary: 179 mg/dL — ABNORMAL HIGH (ref 65–99)

## 2015-07-15 LAB — NM MYOCAR MULTI W/SPECT W/WALL MOTION / EF
CSEPEDS: 0 s
CSEPEW: 1 METS
CSEPPHR: 89 {beats}/min
Exercise duration (min): 0 min
MPHR: 155 {beats}/min
Percent HR: 57 %
Rest HR: 71 {beats}/min

## 2015-07-15 LAB — BASIC METABOLIC PANEL
Anion gap: 13 (ref 5–15)
BUN: 24 mg/dL — AB (ref 6–20)
CALCIUM: 9.1 mg/dL (ref 8.9–10.3)
CHLORIDE: 99 mmol/L — AB (ref 101–111)
CO2: 27 mmol/L (ref 22–32)
CREATININE: 1.5 mg/dL — AB (ref 0.44–1.00)
GFR, EST AFRICAN AMERICAN: 41 mL/min — AB (ref >60)
GFR, EST NON AFRICAN AMERICAN: 35 mL/min — AB (ref >60)
Glucose, Bld: 220 mg/dL — ABNORMAL HIGH (ref 65–99)
Potassium: 4.3 mmol/L (ref 3.5–5.1)
SODIUM: 139 mmol/L (ref 135–145)

## 2015-07-15 LAB — HEMOGLOBIN A1C
Hgb A1c MFr Bld: 8.1 % — ABNORMAL HIGH (ref 4.8–5.6)
Mean Plasma Glucose: 186 mg/dL

## 2015-07-15 MED ORDER — MORPHINE SULFATE (PF) 2 MG/ML IV SOLN
2.0000 mg | INTRAVENOUS | Status: DC | PRN
  Administered 2015-07-15 – 2015-07-16 (×2): 2 mg via INTRAVENOUS
  Filled 2015-07-15 (×2): qty 1

## 2015-07-15 MED ORDER — TECHNETIUM TC 99M SESTAMIBI GENERIC - CARDIOLITE
30.0000 | Freq: Once | INTRAVENOUS | Status: AC | PRN
  Administered 2015-07-15: 30 via INTRAVENOUS

## 2015-07-15 MED ORDER — KETOROLAC TROMETHAMINE 30 MG/ML IJ SOLN: 15.0000 mg | Freq: Four times a day (QID) | INTRAMUSCULAR | Status: DC | PRN

## 2015-07-15 MED ORDER — PERFLUTREN LIPID MICROSPHERE
1.0000 mL | Freq: Once | INTRAVENOUS | Status: AC
  Administered 2015-07-15: 3 mL via INTRAVENOUS
  Filled 2015-07-15: qty 10

## 2015-07-15 MED ORDER — SALINE SPRAY 0.65 % NA SOLN
1.0000 | NASAL | Status: DC | PRN
  Administered 2015-07-16: 1 via NASAL
  Filled 2015-07-15: qty 44

## 2015-07-15 MED ORDER — PREGABALIN 50 MG PO CAPS
75.0000 mg | ORAL_CAPSULE | Freq: Every day | ORAL | Status: DC
  Administered 2015-07-16: 75 mg via ORAL
  Filled 2015-07-15: qty 1

## 2015-07-15 NOTE — Progress Notes (Signed)
   07/15/15 2246  BiPAP/CPAP/SIPAP  BiPAP/CPAP/SIPAP Pt Type Adult  Mask Type Full face mask  Mask Size Medium  Set Rate 0 breaths/min  Respiratory Rate 16 breaths/min  IPAP 10 cmH20  EPAP 10 cmH2O  Oxygen Percent 21 %  Flow Rate 0 lpm  BiPAP/CPAP/SIPAP CPAP  Patient Home Equipment No  Auto Titrate No  BiPAP/CPAP /SiPAP Vitals  Pulse Rate 78  Resp 16  Placed patient on CPAP with above settings. She took off after a few minutes to get some snacks. RT educated her on how put it on and how to remove the mask. She was instructed also to call RT for any problem regarding the CPAP.

## 2015-07-15 NOTE — Progress Notes (Signed)
[facility] PROGRESS NOTE  Christy May [id number] DOB: July 01, 1950 DOA: 07/13/2015 PCP: [name], MD  Assessment/Plan: 1. atypical chest pain: very atypical, present only with change in the position of the chest.  Cardiac enzymes are negative. MYOVIEW shows no reversible ischemia or infarction and normal left ventricular motion and left VEF of 57%, ECHO done and results pending.  Pain control.   Diabetic neuropathy: Burning and pins and needles symmetrically in both arms and feet, apparently gabapentin is not working.  Added lyrica.    Diabetes mellitus: CBG (last 3)   Recent Labs  07/15/15 0731 07/15/15 1139 07/15/15 1649  GLUCAP 92 136* 179*    hgba1c is pending.  Resume home meds.    Hypertension: Controlled . Resume home meds.   Residual pneumonia: Started her on levaqin to complete the course.     Code Status: full code.  Family Communication: none at bedside.  Disposition Plan: pending.    Consultants:  none  Procedures:  MYOVIEW stress negative for inducible ischemia.   Echocardiogram pending.   Antibiotics:  levaquin 1/22  HPI/Subjective: Reports neuropathic pain from DM neuropathy. Gabapentin is not helping.  Chest pain is present with movement in the position .   Objective: Filed Vitals:   07/15/15 0500 07/15/15 1320  BP: 118/66 121/48  Pulse: 77 75  Temp: 98.7 F (37.1 C) 98.5 F (36.9 C)  Resp: 19 20    Intake/Output Summary (Last 24 hours) at 07/15/15 1816 Last data filed at 07/15/15 0500  Gross per 24 hour  Intake    480 ml  Output   1100 ml  Net   -620 ml   Filed Weights   07/13/15 2128 07/14/15 0240 07/15/15 0500  Weight: 138.801 kg (306 lb) 139.209 kg (306 lb 14.4 oz) 139.254 kg (307 lb)    Exam:   General:  Alert comfortable.   Cardiovascular: s1s2  Respiratory: diminished at bases,no wheezing or rhonchi  Abdomen: soft non tender non distended bowel sounds hearsd  Musculoskeletal: pedal  edema present.   Data Reviewed: Basic Metabolic Panel:  Recent Labs Lab 07/13/15 2146  NA 141  K 4.6  CL 101  CO2 27  GLUCOSE 279*  BUN 16  CREATININE 1.15*  CALCIUM 9.7   Liver Function Tests: No results for input(s): AST, ALT, ALKPHOS, BILITOT, PROT, ALBUMIN in the last 168 hours. No results for input(s): LIPASE, AMYLASE in the last 168 hours. No results for input(s): AMMONIA in the last 168 hours. CBC:  Recent Labs Lab 07/13/15 2146  WBC 5.7  NEUTROABS 2.9  HGB 11.7*  HCT 36.3  MCV 87.7  PLT 176   Cardiac Enzymes:  Recent Labs Lab 07/14/15 0539  TROPONINI <0.03   BNP (last 3 results)  Recent Labs  09/02/14 0550 07/13/15 2146  BNP 79.3 82.4    ProBNP (last 3 results)  Recent Labs  06/18/15 1523  PROBNP 177.0*    CBG:  Recent Labs Lab 07/14/15 1648 07/14/15 2136 07/15/15 0731 07/15/15 1139 07/15/15 1649  GLUCAP 171* 218* 92 136* 179*    Recent Results (from the past 240 hour(s))  MRSA PCR Screening     Status: None   Collection Time: 07/14/15  3:02 AM  Result Value Ref Range Status   MRSA by PCR NEGATIVE NEGATIVE Final    Comment:        The GeneXpert MRSA Assay (FDA approved for NASAL specimens only), is one component of a comprehensive MRSA colonization surveillance program. It is not  intended to diagnose MRSA infection nor to guide or monitor treatment for MRSA infections.      Studies: Dg Chest 2 View  07/13/2015  CLINICAL DATA:  Chest pain shortness of breath for 3 days hypertension former smoker EXAM: CHEST  2 VIEW COMPARISON:  07/05/2015, 06/18/2015, 09/02/2014 FINDINGS: Mild cardiac enlargement stable. Bilateral perihilar bronchitic change stable. Right lung is clear. Minimal lingular infiltrate. IMPRESSION: Minimal residual lingular infiltrate consistent with resolving pneumonia. Suggest follow-up in 2-4 weeks to ensure complete resolution. Electronically Signed   By: Skipper Cliche M.D.   On: 07/13/2015 22:46   Nm  Myocar Multi W/spect W/wall Motion / Ef  07/15/2015  CLINICAL DATA:  Chest pain, difficulty breathing, shortness of Breath EXAM: MYOCARDIAL IMAGING WITH SPECT (REST AND PHARMACOLOGIC-STRESS - 2 DAY PROTOCOL) GATED LEFT VENTRICULAR WALL MOTION STUDY LEFT VENTRICULAR EJECTION FRACTION TECHNIQUE: Standard myocardial SPECT imaging was performed after resting intravenous injection of 30 mCi Tc-27m sestamibi. Subsequently, on a second day, intravenous infusion of Lexiscan was performed under the supervision of the Cardiology staff. At peak effect of the drug, 30 mCi Tc-17m sestamibi was injected intravenously and standard myocardial SPECT imaging was performed. Quantitative gated imaging was also performed to evaluate left ventricular wall motion, and estimate left ventricular ejection fraction. COMPARISON:  None. FINDINGS: Perfusion: No decreased activity in the left ventricle on stress imaging to suggest reversible ischemia or infarction. Wall Motion: Normal left ventricular wall motion. No left ventricular dilation. Left Ventricular Ejection Fraction: 57 % End diastolic volume 123456 ml End systolic volume 45 ml IMPRESSION: 1. No reversible ischemia or infarction. 2. Normal left ventricular wall motion. 3. Left ventricular ejection fraction 57% 4. Low-risk stress test findings*. *2012 Appropriate Use Criteria for Coronary Revascularization Focused Update: J Am Coll Cardiol. [id number]. http://content.airportbarriers.com.aspx?articleid=[email or web address] Electronically Signed   By: Rolm Baptise M.D.   On: 07/15/2015 13:23    Scheduled Meds: . allopurinol  300 mg Oral Daily  . aspirin EC  81 mg Oral QHS  . enoxaparin (LOVENOX) injection  40 mg Subcutaneous Daily  . famotidine  20 mg Oral QHS  . gabapentin  800 mg Oral TID  . insulin aspart  0-9 Units Subcutaneous TID WC  . insulin NPH Human  140 Units Subcutaneous QAC breakfast  . irbesartan  300 mg Oral Daily  . levofloxacin  750 mg Oral Daily  . metoprolol   75 mg Oral BID  . montelukast  10 mg Oral Daily  . oxybutynin  10 mg Oral Daily  . pantoprazole  40 mg Oral Daily  . [START ON 07/16/2015] pregabalin  75 mg Oral Daily   Continuous Infusions:   Active Problems:   Chest pain    Time spent: 30 minutes.     [facility]  [facility] Pager [phone]  If 7PM-7AM, please contact night-coverage at [email or web address], password [facility] 07/15/2015, 6:16 PM

## 2015-07-15 NOTE — Evaluation (Signed)
Physical Therapy Evaluation Patient Details Name: Christy May MRN: [id number] DOB: 03-Jan-1951 Today's Date: 07/15/2015   History of Present Illness  Patient is a 65 yo female with history of HTN, DMII, 11 PY smoking history who came with cc of chest pain that has been constant in substernal area for 3 days, relieved partially by tylenol, positional, non exertional, similar to a pain she had several years ago after surgery on her back. She has had dyspnea and cough productive of clear sputum but was diagnosed with pneumonia a month ago and had two Zpack courses and prednisone as she did not have complete resolution. She finished her course of abx 1 week ago. She had hemoptysis when she was diagnosed with pneumonia initially that has resolved.  Clinical Impression  Pt admitted with above diagnosis. Pt currently with functional limitations due to the deficits listed below (see PT Problem List). On eval, pt required supervision bed mobility, min guard assist transfers, and min guard assist gait with RW. Pt will benefit from skilled PT to increase their independence and safety with mobility to allow discharge to the venue listed below.       Follow Up Recommendations Home health PT;Supervision - Intermittent    Equipment Recommendations  None recommended by PT    Recommendations for Other Services       Precautions / Restrictions Precautions Precautions: None Restrictions Weight Bearing Restrictions: No      Mobility  Bed Mobility Overal bed mobility: Needs Assistance Bed Mobility: Supine to Sit;Sit to Supine     Supine to sit: Supervision;HOB elevated Sit to supine: Supervision;HOB elevated   General bed mobility comments: Supervision for safety. No physical assist. Use of bed rails.  Transfers Overall transfer level: Needs assistance Equipment used: Rolling walker (2 wheeled) Transfers: Sit to/from [facility] Sit to Stand: Min guard Stand pivot  transfers: Min guard       General transfer comment: Min guard for safety. No physical assist. Verbal cues for hand placement.  Ambulation/Gait Ambulation/Gait assistance: Min guard Ambulation Distance (Feet): 30 Feet Assistive device: Rolling walker (2 wheeled) Gait Pattern/deviations: Step-through pattern;Decreased stride length;Wide base of support Gait velocity: decreased   General Gait Details: Pt awaiting breakfast tray. She was NPO for stress test this AM and hasn't eaten since returning to her room. Gait distance limited to in room due to weakness/hunger.  Stairs            Wheelchair Mobility    Modified Rankin (Stroke Patients Only)       Balance                                             Pertinent Vitals/Pain Pain Assessment: No/denies pain    Home Living Family/patient expects to be discharged to:: Private residence Living Arrangements: Alone Available Help at Discharge: Friend(s);Available PRN/intermittently Type of Home: House Home Access: Stairs to enter   Entrance Stairs-Number of Steps: 1 Home Layout: One level Home Equipment: Walker - 4 wheels;Shower seat;Bedside commode      Prior Function Level of Independence: Independent with assistive device(s)         Comments: uses rollator RW from time to time     Hand Dominance        Extremity/Trunk Assessment  Communication   Communication: No difficulties  Cognition Arousal/Alertness: Awake/alert Behavior During Therapy: WFL for tasks assessed/performed Overall Cognitive Status: Within Functional Limits for tasks assessed                      General Comments      Exercises        Assessment/Plan    PT Assessment Patient needs continued PT services  PT Diagnosis Difficulty walking;Generalized weakness   PT Problem List Decreased strength;Decreased activity tolerance;Decreased mobility;Decreased  balance;Cardiopulmonary status limiting activity  PT Treatment Interventions DME instruction;Gait training;Stair training;Functional mobility training;Therapeutic activities;Therapeutic exercise;Patient/family education;Balance training   PT Goals (Current goals can be found in the Care Plan section) Acute Rehab PT Goals Patient Stated Goal: get stronger PT Goal Formulation: With patient Time For Goal Achievement: 07/29/15 Potential to Achieve Goals: Good    Frequency Min 3X/week   Barriers to discharge        Co-evaluation               End of Session Equipment Utilized During Treatment: Gait belt Activity Tolerance: Other (comment) (hungry, awaiting breakfast tray, NPO in AM for stress test) Patient left: in bed;with call bell/phone within reach;with bed alarm set Nurse Communication: Mobility status    Functional Assessment Tool Used: clinical judgement Functional Limitation: Mobility: Walking and moving around Mobility: Walking and Moving Around Current Status [id number]): At least 1 percent but less than 20 percent impaired, limited or restricted Mobility: Walking and Moving Around Goal Status [phone]): At least 1 percent but less than 20 percent impaired, limited or restricted    Time: 1032-1052 PT Time Calculation (min) (ACUTE ONLY): 20 min   Charges:   PT Evaluation $PT Eval Moderate Complexity: 1 Procedure     PT G Codes:   PT G-Codes **NOT FOR INPATIENT CLASS** Functional Assessment Tool Used: clinical judgement Functional Limitation: Mobility: Walking and moving around Mobility: Walking and Moving Around Current Status [id number]): At least 1 percent but less than 20 percent impaired, limited or restricted Mobility: Walking and Moving Around Goal Status [phone]): At least 1 percent but less than 20 percent impaired, limited or restricted    Lorriane Shire 07/15/2015, 11:41 AM

## 2015-07-15 NOTE — Telephone Encounter (Signed)
Thank you :)

## 2015-07-15 NOTE — Telephone Encounter (Signed)
Called pt. Received VM. Pt currently admitted. Will forward to RB so he is aware.

## 2015-07-15 NOTE — Progress Notes (Signed)
Echocardiogram 2D Echocardiogram with Definity has been performed.  Tresa Res 07/15/2015, 2:01 PM

## 2015-07-15 NOTE — Care Management Obs Status (Signed)
[place] NOTIFICATION   Patient Details  Name: NARALY GOODLOE MRN: [id number] Date of Birth: [birth year]   Medicare Observation Status Notification Given:  Yes    Bethena Roys, RN 07/15/2015, 11:06 AM

## 2015-07-16 DIAGNOSIS — J40 Bronchitis, not specified as acute or chronic: Secondary | ICD-10-CM | POA: Diagnosis not present

## 2015-07-16 DIAGNOSIS — R079 Chest pain, unspecified: Secondary | ICD-10-CM | POA: Diagnosis not present

## 2015-07-16 DIAGNOSIS — Z720 Tobacco use: Secondary | ICD-10-CM | POA: Diagnosis not present

## 2015-07-16 DIAGNOSIS — J302 Other seasonal allergic rhinitis: Secondary | ICD-10-CM | POA: Diagnosis not present

## 2015-07-16 DIAGNOSIS — M6281 Muscle weakness (generalized): Secondary | ICD-10-CM | POA: Diagnosis not present

## 2015-07-16 DIAGNOSIS — R0782 Intercostal pain: Secondary | ICD-10-CM | POA: Diagnosis not present

## 2015-07-16 DIAGNOSIS — E1140 Type 2 diabetes mellitus with diabetic neuropathy, unspecified: Secondary | ICD-10-CM | POA: Diagnosis not present

## 2015-07-16 DIAGNOSIS — G894 Chronic pain syndrome: Secondary | ICD-10-CM | POA: Diagnosis not present

## 2015-07-16 DIAGNOSIS — I10 Essential (primary) hypertension: Secondary | ICD-10-CM | POA: Diagnosis not present

## 2015-07-16 DIAGNOSIS — M1A9XX0 Chronic gout, unspecified, without tophus (tophi): Secondary | ICD-10-CM | POA: Diagnosis not present

## 2015-07-16 DIAGNOSIS — M545 Low back pain: Secondary | ICD-10-CM | POA: Diagnosis not present

## 2015-07-16 DIAGNOSIS — J188 Other pneumonia, unspecified organism: Secondary | ICD-10-CM | POA: Diagnosis not present

## 2015-07-16 DIAGNOSIS — K219 Gastro-esophageal reflux disease without esophagitis: Secondary | ICD-10-CM | POA: Diagnosis not present

## 2015-07-16 DIAGNOSIS — R0602 Shortness of breath: Secondary | ICD-10-CM | POA: Diagnosis not present

## 2015-07-16 DIAGNOSIS — R270 Ataxia, unspecified: Secondary | ICD-10-CM | POA: Diagnosis not present

## 2015-07-16 DIAGNOSIS — G4733 Obstructive sleep apnea (adult) (pediatric): Secondary | ICD-10-CM | POA: Diagnosis not present

## 2015-07-16 DIAGNOSIS — S37009A Unspecified injury of unspecified kidney, initial encounter: Secondary | ICD-10-CM | POA: Diagnosis not present

## 2015-07-16 DIAGNOSIS — M62838 Other muscle spasm: Secondary | ICD-10-CM | POA: Diagnosis not present

## 2015-07-16 DIAGNOSIS — E1340 Other specified diabetes mellitus with diabetic neuropathy, unspecified: Secondary | ICD-10-CM | POA: Diagnosis not present

## 2015-07-16 LAB — CBC
HEMATOCRIT: 33.9 % — AB (ref 36.0–46.0)
Hemoglobin: 10.3 g/dL — ABNORMAL LOW (ref 12.0–15.0)
MCH: 27 pg (ref 26.0–34.0)
MCHC: 30.4 g/dL (ref 30.0–36.0)
MCV: 89 fL (ref 78.0–100.0)
PLATELETS: 146 10*3/uL — AB (ref 150–400)
RBC: 3.81 MIL/uL — AB (ref 3.87–5.11)
RDW: 15.1 % (ref 11.5–15.5)
WBC: 5.4 10*3/uL (ref 4.0–10.5)

## 2015-07-16 LAB — BASIC METABOLIC PANEL
ANION GAP: 9 (ref 5–15)
BUN: 20 mg/dL (ref 6–20)
CHLORIDE: 103 mmol/L (ref 101–111)
CO2: 28 mmol/L (ref 22–32)
Calcium: 9.1 mg/dL (ref 8.9–10.3)
Creatinine, Ser: 1.34 mg/dL — ABNORMAL HIGH (ref 0.44–1.00)
GFR calc non Af Amer: 41 mL/min — ABNORMAL LOW (ref >60)
GFR, EST AFRICAN AMERICAN: 47 mL/min — AB (ref >60)
GLUCOSE: 260 mg/dL — AB (ref 65–99)
Potassium: 4.7 mmol/L (ref 3.5–5.1)
Sodium: 140 mmol/L (ref 135–145)

## 2015-07-16 LAB — GLUCOSE, CAPILLARY
GLUCOSE-CAPILLARY: 218 mg/dL — AB (ref 65–99)
Glucose-Capillary: 218 mg/dL — ABNORMAL HIGH (ref 65–99)
Glucose-Capillary: 236 mg/dL — ABNORMAL HIGH (ref 65–99)

## 2015-07-16 MED ORDER — INSULIN ASPART 100 UNIT/ML ~~LOC~~ SOLN
0.0000 [IU] | Freq: Three times a day (TID) | SUBCUTANEOUS | Status: DC
Start: 2015-07-16 — End: 2015-07-16
  Administered 2015-07-16: 5 [IU] via SUBCUTANEOUS

## 2015-07-16 MED ORDER — OXYCODONE HCL 5 MG PO TABS: 5.0000 mg | ORAL_TABLET | ORAL | Status: DC | PRN

## 2015-07-16 MED ORDER — LEVOFLOXACIN 750 MG PO TABS
750.0000 mg | ORAL_TABLET | Freq: Every day | ORAL | Status: DC
Start: 2015-07-16 — End: 2015-07-31

## 2015-07-16 MED ORDER — OXYCODONE HCL 5 MG PO TABS
5.0000 mg | ORAL_TABLET | ORAL | Status: DC | PRN
Start: 2015-07-16 — End: 2015-07-16
  Administered 2015-07-16: 5 mg via ORAL
  Filled 2015-07-16: qty 1

## 2015-07-16 MED ORDER — INSULIN ASPART 100 UNIT/ML ~~LOC~~ SOLN: 0.0000 [IU] | Freq: Every day | SUBCUTANEOUS | Status: DC

## 2015-07-16 MED ORDER — PREGABALIN 75 MG PO CAPS: 75.0000 mg | ORAL_CAPSULE | Freq: Every day | ORAL | Status: DC

## 2015-07-16 NOTE — Care Management Note (Addendum)
Case Management Note  Patient Details  Name: Christy May MRN: [id number] Date of Birth: 1951/02/23  Subjective/Objective: Pt admitted for chest pain. Pt has hx of  Diabetes-with Diabetic Neuropathy- Pt having constant pain and is from home alone. Pt states she has a Rolator and the wheel is broken. CM did call [facility] and after so long they can not maintain maintenance on a product. CM will make pt aware.                     Action/Plan: CM did peak with pt in regards to [place]. Pt will benefit from [facility], PT, [place] and SW. CM did call [facility] and they are not able to provide any services at this time. CM then called [facility] to see if they will be able to provide services- out of network. [facility] to check co pays for services and CM will make pt aware. CM will continue to monitor.   Expected Discharge Date:  07/16/15               Expected Discharge Plan:  [place]  In-House Referral:  NA  Discharge planning Services  CM Consult  Post Acute Care Choice:  Home Health Choice offered to:  Patient  DME Arranged:  N/A DME Agency:  NA  HH Arranged:  RN, PT, Nurse's Aide, Social Work [employer] Agency:  [facility] Status of Service:  Completed, signed off  Medicare Important Message Given:    Date Medicare IM Given:    Medicare IM give by:    Date Additional Medicare IM Given:    Additional Medicare Important Message give by:     If discussed at [place] of Stay Meetings, dates discussed:    Additional Comments: 1620 07-16-15 Jacqlyn Krauss, RN,BSN [phone] CM did speak with pt in regards to disposition needs and she is agreeable to SNF. CSW did assist with SNF Placement and plan will be for d/c today. No further needs from CM at this time.   1334 07-16-15 Jacqlyn Krauss, RN,BSN [phone] CM did speak with pt in regards to disposition needs and [facility] spoke with pt in regards to co pay. Per pt she will not be able to afford co pays each visit.  CM did reach out to CSW to see if pt qualifies for SNF placement. Pt is agreeable to SNF at this time and CSW to speak to pt in regards. Dr Karleen Hampshire is aware of plan of care.   Bethena Roys, RN 07/16/2015, 10:49 AM

## 2015-07-16 NOTE — NC FL2 (Signed)
[place] MEDICAID FL2 LEVEL OF CARE SCREENING TOOL     IDENTIFICATION  Patient Name: Christy May Birthdate: Aug 05, 1950 Sex: female Admission Date (Current Location): 07/13/2015  [facility] and [place] Number:  Herbalist and Address:  [facility], [place] [address] [place], [place] [address]      Provider Number: [id number]  Attending Physician Name and Address:  Hosie Poisson, MD  Relative Name and Phone Number:       Current Level of Care: Hospital Recommended Level of Care: [place] Prior Approval Number:    Date Approved/Denied: 07/16/15 PASRR Number: [id number]  Discharge Plan: SNF    Current Diagnoses: Patient Active Problem List   Diagnosis Date Noted  . Chest pain 07/14/2015  . Dyspnea on exertion 07/05/2015  . Obstructive sleep apnea 07/05/2015  . Acute bronchitis 05/30/2015  . Constipation 03/05/2015  . Allergic rhinitis 03/05/2015  . Hearing loss 03/05/2015  . Vocal cord dysfunction 03/05/2015  . Pain in joint, shoulder region 09/11/2014  . Weakness generalized 09/02/2014  . Acute esophagitis 05/08/2014  . Benign neoplasm of sigmoid colon 05/08/2014  . Change in bowel habits 04/17/2014  . Fecal incontinence 04/17/2014  . Urge incontinence 04/08/2014  . Cough 12/06/2013  . Chronic pain syndrome 08/24/2013  . Orthostasis 07/18/2013  . Acute kidney injury ([place]) 07/18/2013  . Bilateral carpal tunnel syndrome 02/14/2013  . Gout 01/04/2013  . Failure to thrive 06/28/2012  . Gait disorder 06/28/2012  . Type 1 diabetes mellitus with neurological manifestations, uncontrolled ([place]) 08/30/2011  . Preventative health care 08/23/2011  . Cervical radiculopathy 02/11/2011  . Low back pain 02/11/2011  . VITAMIN D DEFICIENCY 11/22/2009  . ANEMIA-NOS 11/22/2009  . DEGENERATIVE JOINT DISEASE, CERVICAL SPINE 11/22/2009  . Depression 09/16/2009  . POLYNEUROPATHY 09/16/2009  . Morbid obesity ([place]) 09/09/2009  .  Essential hypertension 09/09/2009  . GERD 09/09/2009  . Sleep apnea 09/09/2009    Orientation RESPIRATION BLADDER Height & Weight    Self, Time, Situation, Place  Normal Continent 5\' 4"  (162.6 cm) 307 lbs.  BEHAVIORAL SYMPTOMS/MOOD NEUROLOGICAL BOWEL NUTRITION STATUS   (NONE)  (NONE) Continent Diet (Heart Healthy)  AMBULATORY STATUS COMMUNICATION OF NEEDS Skin   Limited Assist Verbally Normal                       Personal Care Assistance Level of Assistance  Bathing, Feeding, Dressing Bathing Assistance: Limited assistance Feeding assistance: Limited assistance Dressing Assistance: Limited assistance     Functional Limitations Info  Sight, Hearing, Speech Sight Info: Adequate Hearing Info: Adequate Speech Info: Adequate    SPECIAL CARE FACTORS FREQUENCY  PT (By licensed PT), OT (By licensed OT)     PT Frequency: 5/week OT Frequency: 5/week            Contractures Contractures Info: Not present    Additional Factors Info  Insulin Sliding Scale, Code Status, Allergies Code Status Info: Full Allergies Info: Ace Inhibitors, Penicillins, Adhesive, Tape   Insulin Sliding Scale Info: 4/day       Current Medications (07/16/2015):  This is the current hospital active medication list Current Facility-Administered Medications  Medication Dose Route Frequency Provider Last Rate Last Dose  . albuterol (PROVENTIL) (2.5 MG/3ML) 0.083% nebulizer solution 2.5 mg  2.5 mg Nebulization Q6H PRN Gennaro Africa, MD      . allopurinol (ZYLOPRIM) tablet 300 mg  300 mg Oral Daily Gennaro Africa, MD   300 mg at 07/16/15 0813  .  aspirin EC tablet 81 mg  81 mg Oral QHS Gennaro Africa, MD   81 mg at 07/15/15 2030  . cyclobenzaprine (FLEXERIL) tablet 5 mg  5 mg Oral TID PRN Gennaro Africa, MD   5 mg at 07/15/15 2036  . enoxaparin (LOVENOX) injection 40 mg  40 mg Subcutaneous Daily Gennaro Africa, MD   40 mg at 07/16/15 0814  . famotidine (PEPCID) tablet 20 mg  20 mg Oral QHS Gennaro Africa, MD   20 mg  at 07/15/15 2031  . gabapentin (NEURONTIN) capsule 800 mg  800 mg Oral TID Gennaro Africa, MD   800 mg at 07/16/15 0810  . insulin aspart (novoLOG) injection 0-15 Units  0-15 Units Subcutaneous TID WC Hosie Poisson, MD      . insulin aspart (novoLOG) injection 0-5 Units  0-5 Units Subcutaneous QHS Hosie Poisson, MD      . insulin NPH Human (HUMULIN N,NOVOLIN N) injection 140 Units  140 Units Subcutaneous QAC breakfast Gennaro Africa, MD   140 Units at 07/16/15 0807  . levofloxacin (LEVAQUIN) tablet 750 mg  750 mg Oral Daily Hosie Poisson, MD   750 mg at 07/16/15 0811  . metoprolol tartrate (LOPRESSOR) tablet 75 mg  75 mg Oral BID Gennaro Africa, MD   75 mg at 07/16/15 [id number]  . montelukast (SINGULAIR) tablet 10 mg  10 mg Oral Daily Gennaro Africa, MD   10 mg at 07/16/15 [id number]  . morphine 2 MG/ML injection 2 mg  2 mg Intravenous Q3H PRN Hosie Poisson, MD   2 mg at 07/16/15 0542  . nitroGLYCERIN (NITROSTAT) SL tablet 0.4 mg  0.4 mg Sublingual Q5 min PRN Fransico Meadow, PA-C      . nitroGLYCERIN (NITROSTAT) SL tablet 0.4 mg  0.4 mg Sublingual Q5 min PRN Gennaro Africa, MD   0.4 mg at 07/14/15 0310  . ondansetron (ZOFRAN) tablet 4 mg  4 mg Oral Q8H PRN Gennaro Africa, MD      . oxybutynin (DITROPAN-XL) 24 hr tablet 10 mg  10 mg Oral Daily Gennaro Africa, MD   10 mg at 07/15/15 1056  . oxyCODONE (Oxy IR/ROXICODONE) immediate release tablet 5 mg  5 mg Oral Q4H PRN Hosie Poisson, MD      . pantoprazole (PROTONIX) EC tablet 40 mg  40 mg Oral Daily Gennaro Africa, MD   40 mg at 07/16/15 0810  . pregabalin (LYRICA) capsule 75 mg  75 mg Oral Daily Hosie Poisson, MD   75 mg at 07/16/15 0811  . sodium chloride (OCEAN) 0.65 % nasal spray 1 spray  1 spray Each Nare PRN Hosie Poisson, MD   1 spray at 07/16/15 0542     Discharge Medications: Please see discharge summary for a list of discharge medications.  Relevant Imaging Results:  Relevant Lab Results:   Additional Information SSN: [id number]  Liz Beach MSW, [place], [place],  [id number]

## 2015-07-16 NOTE — Discharge Summary (Signed)
Physician Discharge Summary  Christy May [id number] DOB: Jan 11, 1951 DOA: 07/13/2015  PCP: [name], MD  Admit date: 07/13/2015 Discharge date: 07/16/2015  Time spent: 25 minutes  Recommendations for Outpatient Follow-up:  1. Follow up with PCP in one week.  2. Please follow up with cardiology in 4 weeks.    Discharge Diagnoses:  Active Problems:    Atypical  Chest pain   Discharge Condition: IMPROVED  Diet recommendation: carb modified diet.   Filed Weights   07/14/15 0240 07/15/15 0500 07/16/15 0802  Weight: 139.209 kg (306 lb 14.4 oz) 139.254 kg (307 lb) 139.39 kg (307 lb 4.8 oz)    History of present illness:  - Patient is a 65 yo female with history of HTN, DMII, 20 PY smoking history who came with cc of chest pain, positional and non exertional . She was admitted to telemetry for evaluation of ACS.   Hospital Course:  1. atypical chest pain: very atypical, present only with change in the position of the chest. Cardiac enzymes are negative. MYOVIEW shows no reversible ischemia or infarction and normal left ventricular motion and left VEF of 57%, ECHO done and results show good LVEF and grade 2 diastolic dysfunction. She does not appear to be volume overloaded.   Pain control.   Diabetic neuropathy: Burning and pins and needles symmetrically in both arms and feet, apparently gabapentin is not working.  Added lyrica and oxyIR.  Seems to be improving.    Diabetes mellitus: CBG (last 3)   Recent Labs (last 2 labs)      Recent Labs  07/15/15 0731 07/15/15 1139 07/15/15 1649  GLUCAP 92 136* 179*      hgba1c is  8.1 Resume home meds.    Hypertension: Controlled . Resume home meds.   Residual pneumonia: Started her on levaqin to complete the course.           Procedures:  Nuclear stress test  Echocardiogram Systolic function was normal. The estimated ejection fraction was in the range of 55% to 60%. Wall motion was  normal; there were no regional wall motion abnormalities. Features are consistent with a pseudonormal left ventricular filling pattern, with concomitant abnormal relaxation and increased filling pressure (grade 2 diastolic dysfunction).  Consultations:  none  Discharge Exam: Filed Vitals:   07/16/15 0536 07/16/15 1421  BP: 152/61 146/66  Pulse: 82 81  Temp: 97.7 F (36.5 C) 98.6 F (37 C)  Resp: 20 18    General: alert afebrile comfortable.  Cardiovascular: s1s2 Respiratory: ctab  Discharge Instructions   Discharge Instructions    Diet - low sodium heart healthy    Complete by:  As directed           Current Discharge Medication List    START taking these medications   Details  levofloxacin (LEVAQUIN) 750 MG tablet Take 1 tablet (750 mg total) by mouth daily. Qty: 5 tablet, Refills: 0    oxyCODONE (OXY IR/ROXICODONE) 5 MG immediate release tablet Take 1 tablet (5 mg total) by mouth every 4 (four) hours as needed for moderate pain. Qty: 15 tablet, Refills: 0    pregabalin (LYRICA) 75 MG capsule Take 1 capsule (75 mg total) by mouth daily. Qty: 20 capsule, Refills: 1      CONTINUE these medications which have NOT CHANGED   Details  albuterol (PROVENTIL) (2.5 MG/3ML) 0.083% nebulizer solution Take 2.5 mg by nebulization every 6 (six) hours as needed for wheezing or shortness of breath.    allopurinol (  ZYLOPRIM) 300 MG tablet Take 1 tablet (300 mg total) by mouth daily. Qty: 30 tablet, Refills: 6    aspirin 81 MG tablet Take 81 mg by mouth at bedtime.     cholecalciferol (VITAMIN D) 1000 UNITS tablet Take 2,000 Units by mouth every morning.     famotidine (PEPCID) 20 MG tablet TAKE 1 TABLET BY MOUTH EVERY NIGHT AT BEDTIME Qty: 30 tablet, Refills: 5    fluticasone (FLONASE) 50 MCG/ACT nasal spray Place 2 sprays into both nostrils every morning. Qty: 16 g, Refills: 6    folic acid (FOLVITE) 1 MG tablet Take 4 tablets (4 mg total) by mouth daily. Qty:  120 tablet, Refills: 11    gabapentin (NEURONTIN) 800 MG tablet Take 1 tablet (800 mg total) by mouth 3 (three) times daily. Qty: 270 tablet, Refills: 1    glucosamine-chondroitin 500-400 MG tablet Take 1 tablet by mouth 3 (three) times daily.    insulin NPH Human (HUMULIN N) 100 UNIT/ML injection Inject 1.7 mLs (170 Units total) into the skin daily before breakfast. And syringes 2/day Qty: 60 mL, Refills: 3    irbesartan (AVAPRO) 300 MG tablet Take 1 tablet (300 mg total) by mouth daily. Qty: 90 tablet, Refills: 1    loratadine (CLARITIN) 10 MG tablet TAKE 1 TABLET BY MOUTH EVERY DAY Qty: 30 tablet, Refills: 5    metoprolol (LOPRESSOR) 50 MG tablet Take 1.5 tablets (75 mg total) by mouth 2 (two) times daily. Qty: 270 tablet, Refills: 3    montelukast (SINGULAIR) 10 MG tablet TAKE 1 TABLET BY MOUTH DAILY Qty: 90 tablet, Refills: 0    nystatin cream (MYCOSTATIN) APPLY TO AFFECTED AREA TWICE DAILY Qty: 30 g, Refills: 0    Omega-3 Fatty Acids (FISH OIL) 1200 MG CPDR Take 1,200 mg by mouth daily.    omeprazole (PRILOSEC) 40 MG capsule TAKE 1 CAPSULE(40 MG) BY MOUTH DAILY Qty: 30 capsule, Refills: 5    oxybutynin (DITROPAN-XL) 10 MG 24 hr tablet Take 1 tablet (10 mg total) by mouth daily. Qty: 90 tablet, Refills: 1    cyclobenzaprine (FLEXERIL) 5 MG tablet Take 1 tablet (5 mg total) by mouth 3 (three) times daily as needed for muscle spasms. Qty: 90 tablet, Refills: 1    glucose blood (ONE TOUCH ULTRA TEST) test strip USE TO TEST BLOOD SUGAR TWICE DAILY. DX: E10.41 Qty: 100 each, Refills: 5    Lancets (ONETOUCH ULTRASOFT) lancets Use to test blood sugars twice a day. Qty: 100 each, Refills: 12    ondansetron (ZOFRAN) 4 MG tablet Take 1 tablet (4 mg total) by mouth every 8 (eight) hours as needed for nausea or vomiting. Qty: 40 tablet, Refills: 1    venlafaxine XR (EFFEXOR-XR) 37.5 MG 24 hr capsule TAKE 1 CAPSULE(37.5 MG) BY MOUTH DAILY WITH BREAKFAST Qty: 30 capsule, Refills:  0      STOP taking these medications     HYDROcodone-homatropine (HYDROMET) 5-1.5 MG/5ML syrup        Allergies  Allergen Reactions  . Ace Inhibitors Anaphylaxis  . Penicillins Hives and Rash  . Adhesive [Tape] Rash  . Tape Hives    Paper tape is ok to use   Follow-up Information    Follow up with [facility].   Why:  Registered Nurse, Physical Therapy and Aide   Contact information:   [address] [place] [address] [phone]       Follow up with [name], MD. Schedule an appointment as soon as  possible for a visit in 1 week.   Specialty:  Family Medicine   Contact information:   [address] [place] [address] [phone]        The results of significant diagnostics from this hospitalization (including imaging, microbiology, ancillary and laboratory) are listed below for reference.    Significant Diagnostic Studies: Dg Chest 2 View  07/13/2015  CLINICAL DATA:  Chest pain shortness of breath for 3 days hypertension former smoker EXAM: CHEST  2 VIEW COMPARISON:  07/05/2015, 06/18/2015, 09/02/2014 FINDINGS: Mild cardiac enlargement stable. Bilateral perihilar bronchitic change stable. Right lung is clear. Minimal lingular infiltrate. IMPRESSION: Minimal residual lingular infiltrate consistent with resolving pneumonia. Suggest follow-up in 2-4 weeks to ensure complete resolution. Electronically Signed   By: Skipper Cliche M.D.   On: 07/13/2015 22:46   Dg Chest 2 View  07/05/2015  CLINICAL DATA:  65 year old female with hemoptysis last month. Continued cough, shortness of breath, chest pain. Subsequent encounter. EXAM: CHEST  2 VIEW COMPARISON:  Chest radiographs 06/18/2015 and earlier. FINDINGS: Stable lung volumes. Largely resolve patchy left perihilar pulmonary opacity since December. Minimal residual. No confluent pulmonary opacity. No pleural effusion. Stable cardiac size and mediastinal contours. Visualized tracheal air  column is within normal limits. Widespread cervical spine fusion hardware re- demonstrated. Degenerative endplate changes in the lower thoracic spine. No acute osseous abnormality identified. IMPRESSION: Regressed and nearly completely resolved left lung bronchopneumonia. No pleural effusion or new cardiopulmonary abnormality. Electronically Signed   By: Genevie Ann M.D.   On: 07/05/2015 15:54   Dg Chest 2 View  06/18/2015  CLINICAL DATA:  Chest tightness, productive cough, shortness of breath for 2 weeks. Hemoptysis. EXAM: CHEST  2 VIEW COMPARISON:  09/02/2014 FINDINGS: Mild cardiomegaly. Patchy left perihilar airspace opacities concerning for pneumonia. No confluent opacity on the right. No effusions. No acute bony abnormality. IMPRESSION: Cardiomegaly. Patchy left perihilar airspace disease concerning for perihilar pneumonia. Electronically Signed   By: Rolm Baptise M.D.   On: 06/18/2015 16:06   Nm Myocar Multi W/spect W/wall Motion / Ef  07/15/2015  CLINICAL DATA:  Chest pain, difficulty breathing, shortness of Breath EXAM: MYOCARDIAL IMAGING WITH SPECT (REST AND PHARMACOLOGIC-STRESS - 2 DAY PROTOCOL) GATED LEFT VENTRICULAR WALL MOTION STUDY LEFT VENTRICULAR EJECTION FRACTION TECHNIQUE: Standard myocardial SPECT imaging was performed after resting intravenous injection of 30 mCi Tc-59m sestamibi. Subsequently, on a second day, intravenous infusion of Lexiscan was performed under the supervision of the Cardiology staff. At peak effect of the drug, 30 mCi Tc-42m sestamibi was injected intravenously and standard myocardial SPECT imaging was performed. Quantitative gated imaging was also performed to evaluate left ventricular wall motion, and estimate left ventricular ejection fraction. COMPARISON:  None. FINDINGS: Perfusion: No decreased activity in the left ventricle on stress imaging to suggest reversible ischemia or infarction. Wall Motion: Normal left ventricular wall motion. No left ventricular dilation.  Left Ventricular Ejection Fraction: 57 % End diastolic volume 123456 ml End systolic volume 45 ml IMPRESSION: 1. No reversible ischemia or infarction. 2. Normal left ventricular wall motion. 3. Left ventricular ejection fraction 57% 4. Low-risk stress test findings*. *2012 Appropriate Use Criteria for Coronary Revascularization Focused Update: J Am Coll Cardiol. [id number]. http://content.airportbarriers.com.aspx?articleid=[email or web address] Electronically Signed   By: Rolm Baptise M.D.   On: 07/15/2015 13:23    Microbiology: Recent Results (from the past 240 hour(s))  MRSA PCR Screening     Status: None   Collection Time: 07/14/15  3:02 AM  Result Value Ref Range Status  MRSA by PCR NEGATIVE NEGATIVE Final    Comment:        The GeneXpert MRSA Assay (FDA approved for NASAL specimens only), is one component of a comprehensive MRSA colonization surveillance program. It is not intended to diagnose MRSA infection nor to guide or monitor treatment for MRSA infections.      Labs: Basic Metabolic Panel:  Recent Labs Lab 07/13/15 2146 07/15/15 1835 07/16/15 1355  NA 141 139 140  K 4.6 4.3 4.7  CL 101 99* 103  CO2 27 27 28   GLUCOSE 279* 220* 260*  BUN 16 24* 20  CREATININE 1.15* 1.50* 1.34*  CALCIUM 9.7 9.1 9.1   Liver Function Tests: No results for input(s): AST, ALT, ALKPHOS, BILITOT, PROT, ALBUMIN in the last 168 hours. No results for input(s): LIPASE, AMYLASE in the last 168 hours. No results for input(s): AMMONIA in the last 168 hours. CBC:  Recent Labs Lab 07/13/15 2146 07/16/15 0245  WBC 5.7 5.4  NEUTROABS 2.9  --   HGB 11.7* 10.3*  HCT 36.3 33.9*  MCV 87.7 89.0  PLT 176 146*   Cardiac Enzymes:  Recent Labs Lab 07/14/15 0539  TROPONINI <0.03   BNP: BNP (last 3 results)  Recent Labs  09/02/14 0550 07/13/15 2146  BNP 79.3 82.4    ProBNP (last 3 results)  Recent Labs  06/18/15 1523  PROBNP 177.0*    CBG:  Recent Labs Lab 07/15/15 1139  07/15/15 1649 07/15/15 2044 07/16/15 0742 07/16/15 1204  GLUCAP 136* 179* 272* 218* 236*       Signed:  Jannice Beitzel MD.  [facility] 07/16/2015, 4:07 PM

## 2015-07-16 NOTE — Progress Notes (Signed)
Report called to [facility], thorough report given and all questions answered.

## 2015-07-16 NOTE — Clinical Social Work Placement (Signed)
   CLINICAL SOCIAL WORK PLACEMENT  NOTE  Date:  07/16/2015  Patient Details  Name: Christy May MRN: [id number] Date of Birth: [birth year]  Clinical Social Work is seeking post-discharge placement for this patient at the [facility] level of care (*CSW will initial, date and re-position this form in  chart as items are completed):  Yes   Patient/family provided with [facility] Work Department's list of facilities offering this level of care within the geographic area requested by the patient (or if unable, by the patient's family).  Yes   Patient/family informed of their freedom to choose among providers that offer the needed level of care, that participate in Medicare, Medicaid or managed care program needed by the patient, have an available bed and are willing to accept the patient.  Yes   Patient/family informed of [facility]'s ownership interest in [facility] and [facility], as well as of the fact that they are under no obligation to receive care at these facilities.  PASRR submitted to EDS on       PASRR number received on       Existing PASRR number confirmed on 07/16/15     FL2 transmitted to all facilities in geographic area requested by pt/family on 07/16/15     FL2 transmitted to all facilities within larger geographic area on       Patient informed that his/her managed care company has contracts with or will negotiate with certain facilities, including the following:        Yes   Patient/family informed of bed offers received.  Patient chooses bed at [facility]     Physician recommends and patient chooses bed at      Patient to be transferred to [facility] on 07/16/15.  Patient to be transferred to facility by Ambulance     Patient family notified on 07/16/15 of transfer.  Name of family member notified:  Patient has notified family.     PHYSICIAN Please prepare priority discharge summary, including  medications, Please prepare prescriptions, Please sign FL2     Additional Comment:  Per MD patient ready for DC to [facility]. RN, patient, patient's family, and facility notified of DC. RN given number for report. DC packet on chart. Ambulance transport requested for patient. HTA auth received: [id number], good for 7 days. CSW unable to complete full assessment with patient due to late referral. Patient agreeable with DC plan and placement with [facility]. CSW signing off.   _______________________________________________ Rigoberto Noel, LCSW 07/16/2015, 4:51 PM

## 2015-07-19 DIAGNOSIS — E119 Type 2 diabetes mellitus without complications: Secondary | ICD-10-CM | POA: Diagnosis not present

## 2015-07-19 DIAGNOSIS — M1049 Other secondary gout, multiple sites: Secondary | ICD-10-CM | POA: Diagnosis not present

## 2015-07-19 DIAGNOSIS — G4739 Other sleep apnea: Secondary | ICD-10-CM | POA: Diagnosis not present

## 2015-07-19 DIAGNOSIS — J17 Pneumonia in diseases classified elsewhere: Secondary | ICD-10-CM | POA: Diagnosis not present

## 2015-07-19 DIAGNOSIS — K219 Gastro-esophageal reflux disease without esophagitis: Secondary | ICD-10-CM | POA: Diagnosis not present

## 2015-07-24 DIAGNOSIS — M545 Low back pain: Secondary | ICD-10-CM | POA: Diagnosis not present

## 2015-07-24 DIAGNOSIS — R079 Chest pain, unspecified: Secondary | ICD-10-CM | POA: Diagnosis not present

## 2015-07-24 DIAGNOSIS — J40 Bronchitis, not specified as acute or chronic: Secondary | ICD-10-CM | POA: Diagnosis not present

## 2015-07-24 DIAGNOSIS — M62838 Other muscle spasm: Secondary | ICD-10-CM | POA: Diagnosis not present

## 2015-07-24 DIAGNOSIS — Z720 Tobacco use: Secondary | ICD-10-CM | POA: Diagnosis not present

## 2015-07-24 DIAGNOSIS — R0602 Shortness of breath: Secondary | ICD-10-CM | POA: Diagnosis not present

## 2015-07-24 DIAGNOSIS — J302 Other seasonal allergic rhinitis: Secondary | ICD-10-CM | POA: Diagnosis not present

## 2015-07-24 DIAGNOSIS — M6281 Muscle weakness (generalized): Secondary | ICD-10-CM | POA: Diagnosis not present

## 2015-07-24 DIAGNOSIS — G4733 Obstructive sleep apnea (adult) (pediatric): Secondary | ICD-10-CM | POA: Diagnosis not present

## 2015-07-24 DIAGNOSIS — G894 Chronic pain syndrome: Secondary | ICD-10-CM | POA: Diagnosis not present

## 2015-07-24 DIAGNOSIS — K219 Gastro-esophageal reflux disease without esophagitis: Secondary | ICD-10-CM | POA: Diagnosis not present

## 2015-07-24 DIAGNOSIS — E1140 Type 2 diabetes mellitus with diabetic neuropathy, unspecified: Secondary | ICD-10-CM | POA: Diagnosis not present

## 2015-07-24 DIAGNOSIS — I10 Essential (primary) hypertension: Secondary | ICD-10-CM | POA: Diagnosis not present

## 2015-07-24 DIAGNOSIS — R270 Ataxia, unspecified: Secondary | ICD-10-CM | POA: Diagnosis not present

## 2015-07-24 DIAGNOSIS — J188 Other pneumonia, unspecified organism: Secondary | ICD-10-CM | POA: Diagnosis not present

## 2015-07-24 DIAGNOSIS — M1A9XX0 Chronic gout, unspecified, without tophus (tophi): Secondary | ICD-10-CM | POA: Diagnosis not present

## 2015-07-24 DIAGNOSIS — E1340 Other specified diabetes mellitus with diabetic neuropathy, unspecified: Secondary | ICD-10-CM | POA: Diagnosis not present

## 2015-07-24 DIAGNOSIS — S37009A Unspecified injury of unspecified kidney, initial encounter: Secondary | ICD-10-CM | POA: Diagnosis not present

## 2015-07-31 ENCOUNTER — Ambulatory Visit (INDEPENDENT_AMBULATORY_CARE_PROVIDER_SITE_OTHER)
Admission: RE | Admit: 2015-07-31 | Discharge: 2015-07-31 | Disposition: A | Discharge status: Home / Self Care | Payer: PPO | Source: Ambulatory Visit | Attending: Acute Care | Admitting: Acute Care

## 2015-07-31 ENCOUNTER — Ambulatory Visit (INDEPENDENT_AMBULATORY_CARE_PROVIDER_SITE_OTHER): Payer: PPO | Admitting: Acute Care

## 2015-07-31 ENCOUNTER — Telehealth: Payer: Self-pay | Admitting: Emergency Medicine

## 2015-07-31 ENCOUNTER — Telehealth: Payer: Self-pay | Admitting: Acute Care

## 2015-07-31 ENCOUNTER — Encounter: Payer: Self-pay | Admitting: Acute Care

## 2015-07-31 VITALS — BP 104/66 | HR 81 | Temp 97.6°F | Ht 64.25 in | Wt 311.6 lb

## 2015-07-31 DIAGNOSIS — J209 Acute bronchitis, unspecified: Secondary | ICD-10-CM

## 2015-07-31 DIAGNOSIS — J189 Pneumonia, unspecified organism: Secondary | ICD-10-CM | POA: Diagnosis not present

## 2015-07-31 DIAGNOSIS — G4733 Obstructive sleep apnea (adult) (pediatric): Secondary | ICD-10-CM | POA: Diagnosis not present

## 2015-07-31 DIAGNOSIS — R05 Cough: Secondary | ICD-10-CM | POA: Diagnosis not present

## 2015-07-31 MED ORDER — PREDNISONE 10 MG PO TABS: ORAL_TABLET | ORAL | Status: DC

## 2015-07-31 MED ORDER — HYDROCODONE-HOMATROPINE 5-1.5 MG/5ML PO SYRP: 5.0000 mL | ORAL_SOLUTION | Freq: Four times a day (QID) | ORAL | Status: DC | PRN

## 2015-07-31 NOTE — Assessment & Plan Note (Addendum)
Worsening cough with increased mucus production and increased nasal drainage, but no discoloration of sputum. Feels weak Pt. States she had fever this last week. Was hospitalized 1/21-1/24 with CAP, but did not take discharge po  levaquin until 07/28/15.  She will complete the 5 day course 08/01/15. Wells Criteria Score : 1.5 points for hospitalization in last 4 months. Low Risk Group: 1.3 % chance of PE in ED population.  Plan:  CXR today personally reviewed by me shows resolution of pneumonia Prednisone taper Prednisone taper; 10 mg tablets: 4 tabs x 2 days, 3 tabs x 2 days, 2 tabs x 2 days 1 tab x 2 days then stop. Monitor your blood sugars , and call if >250 while on the prednisone. We will give you a prescription for Hydromet for your cough to take to the pharmacy. Take 5 cc's every 6 hours as needed for cough. Do not drive if drowsy. Continue wearing your CPAP every night, goal is 6-8 hours. Follow up in 2 weeks with Dr. Lamonte Sakai. Continue taking your singulair for your allergies. Continue taking your loratidine for your allergies. Continue taking your Prilosec for your reflux. It is important to control your allergies and reflux as they contribute to your upper airway irritation and cough. Please contact office for sooner follow up if symptoms do not improve or worsen or seek emergency care.

## 2015-07-31 NOTE — Patient Instructions (Addendum)
It is nice to meet you today. Continue taking your levaquin until gone. CXR today. I will call you with results. Prednisone taper Prednisone taper; 10 mg tablets: 4 tabs x 2 days, 3 tabs x 2 days, 2 tabs x 2 days 1 tab x 2 days then stop. Monitor your blood sugars , and call if >250 while on the prednisone. We will give you a prescription for Hydromet for your cough to take to the pharmacy. Take 5 cc's every 6 hours as needed for cough. Do not drive if drowsy. Continue wearing your CPAP every night, goal is 6-8 hours. Follow up in 2 weeks with Dr. Lamonte Sakai. Continue taking your singulair for your allergies. Continue taking your loratidine for your allergies. Continue taking your Prilosec for your reflux. It is important to control your allergies and reflux as they contribute to your upper airway irritation and cough. Please contact office for sooner follow up if symptoms do not improve or worsen or seek emergency care.

## 2015-07-31 NOTE — Telephone Encounter (Signed)
Called spoke with pt. She is calling Judson Roch back about CXR results. Pt can be reached at the number above. Please advise Judson Roch thanks

## 2015-07-31 NOTE — Assessment & Plan Note (Signed)
Previously untreated in this 65 year old obese female Pt. States she has received her CPAP from [facility] she is wearing it every night, but cannot tell me when she started.  Plan: Discussed the risks to health associated with not wearing CPAP. Continue on CPAP at bedtime. You appear to be benefiting from the treatment Goal is to wear for at least 4-6 hours each night for maximal clinical benefit. Continue to work on weight loss, as the link between excess weight  and sleep apnea is well established.  Do not drive if sleepy. Follow up with Dr. Lamonte Sakai In 2 weeks or before as needed.

## 2015-07-31 NOTE — Telephone Encounter (Signed)
Attempted to call results of CXR. There was no answer.Will attempt to call again.

## 2015-07-31 NOTE — Progress Notes (Signed)
Subjective:    Patient ID: Christy May, female    DOB: 06-29-1950, 65 y.o.   MRN: [id number]  HPI  65 yo woman, former smoker with obesity, OSA, HTN on irbesartan, DM, childhood asthma. Carries a dx of adult asthma made several years ago. Referred for cough.   Significant Procedures/ Events:  Hospital Admission 07/13/15-07/16/15 for CAP. 07/13/15:  EXAM: CHEST 2 VIEW  COMPARISON: 07/05/2015, 06/18/2015, 09/02/2014  FINDINGS: Mild cardiac enlargement stable. Bilateral perihilar bronchitic change stable. Right lung is clear. Minimal lingular infiltrate.  IMPRESSION: Minimal residual lingular infiltrate consistent with resolving pneumonia. Suggest follow-up in 2-4 weeks to ensure complete Resolution.  07/31/15   EXAM: CHEST - 2 VIEW  COMPARISON: Two-view chest x-ray 07/13/2015  FINDINGS: Previously seen airspace disease has resolved. The heart size is normal. There is no edema or effusion to suggest failure. The visualized soft tissues and bony thorax are unremarkable.  IMPRESSION: Negative two view chest x-ray  07/15/15: Echocardiogram Systolic function was normal. The estimated ejection fraction was in the range of 55% to 60%. Wall motion was normal; there were no regional wall motion abnormalities. Features are consistent with a pseudonormal left ventricular filling pattern, with concomitant abnormal relaxation and increased filling pressure (grade 2 diastolic dysfunction).  07/31/15:Acute Office Visit:  Pt. Returns to the office with worsening cough and increase mucus production( clear). She states she continues to feel weak, but states that she thinks she had a fever last week. She was hospitalized 07/13/15-07/16/15 for CAP, but did not take her out patient Levaquin until 07/28/15 ( almost 2 weeks late). The 5 day treatment will be completed 08/01/15.  Current outpatient prescriptions:  .  albuterol (PROVENTIL) (2.5 MG/3ML) 0.083% nebulizer  solution, Take 2.5 mg by nebulization every 6 (six) hours as needed for wheezing or shortness of breath., Disp: , Rfl:  .  allopurinol (ZYLOPRIM) 300 MG tablet, Take 1 tablet (300 mg total) by mouth daily., Disp: 30 tablet, Rfl: 6 .  aspirin 81 MG tablet, Take 81 mg by mouth at bedtime. , Disp: , Rfl:  .  cholecalciferol (VITAMIN D) 1000 UNITS tablet, Take 2,000 Units by mouth every morning. , Disp: , Rfl:  .  cyclobenzaprine (FLEXERIL) 5 MG tablet, Take 1 tablet (5 mg total) by mouth 3 (three) times daily as needed for muscle spasms., Disp: 90 tablet, Rfl: 1 .  famotidine (PEPCID) 20 MG tablet, TAKE 1 TABLET BY MOUTH EVERY NIGHT AT BEDTIME, Disp: 30 tablet, Rfl: 5 .  fluticasone (FLONASE) 50 MCG/ACT nasal spray, Place 2 sprays into both nostrils every morning., Disp: 16 g, Rfl: 6 .  folic acid (FOLVITE) 1 MG tablet, Take 4 tablets (4 mg total) by mouth daily., Disp: 120 tablet, Rfl: 11 .  gabapentin (NEURONTIN) 800 MG tablet, Take 1 tablet (800 mg total) by mouth 3 (three) times daily., Disp: 270 tablet, Rfl: 1 .  glucosamine-chondroitin 500-400 MG tablet, Take 1 tablet by mouth 3 (three) times daily., Disp: , Rfl:  .  glucose blood (ONE TOUCH ULTRA TEST) test strip, USE TO TEST BLOOD SUGAR TWICE DAILY. DX: E10.41, Disp: 100 each, Rfl: 5 .  insulin NPH Human (HUMULIN N) 100 UNIT/ML injection, Inject 1.7 mLs (170 Units total) into the skin daily before breakfast. And syringes 2/day (Patient taking differently: Inject 140 Units into the skin daily before breakfast. And syringes 2/day), Disp: 60 mL, Rfl: 3 .  irbesartan (AVAPRO) 300 MG tablet, Take 1 tablet (300 mg total) by mouth daily., Disp: 90 tablet,  Rfl: 1 .  Lancets (ONETOUCH ULTRASOFT) lancets, Use to test blood sugars twice a day., Disp: 100 each, Rfl: 12 .  levofloxacin (LEVAQUIN) 750 MG tablet, Take 1 tablet (750 mg total) by mouth daily., Disp: 5 tablet, Rfl: 0 .  loratadine (CLARITIN) 10 MG tablet, TAKE 1 TABLET BY MOUTH EVERY DAY, Disp: 30  tablet, Rfl: 5 .  metoprolol (LOPRESSOR) 50 MG tablet, Take 1.5 tablets (75 mg total) by mouth 2 (two) times daily., Disp: 270 tablet, Rfl: 3 .  montelukast (SINGULAIR) 10 MG tablet, TAKE 1 TABLET BY MOUTH DAILY, Disp: 90 tablet, Rfl: 0 .  nystatin cream (MYCOSTATIN), APPLY TO AFFECTED AREA TWICE DAILY (Patient taking differently: APPLY TO AFFECTED AREA TWICE DAILY AS NEEDED FOR IRRITATION), Disp: 30 g, Rfl: 0 .  Omega-3 Fatty Acids (FISH OIL) 1200 MG CPDR, Take 1,200 mg by mouth daily., Disp: , Rfl:  .  omeprazole (PRILOSEC) 40 MG capsule, TAKE 1 CAPSULE(40 MG) BY MOUTH DAILY, Disp: 30 capsule, Rfl: 5 .  ondansetron (ZOFRAN) 4 MG tablet, Take 1 tablet (4 mg total) by mouth every 8 (eight) hours as needed for nausea or vomiting., Disp: 40 tablet, Rfl: 1 .  oxybutynin (DITROPAN-XL) 10 MG 24 hr tablet, Take 1 tablet (10 mg total) by mouth daily., Disp: 90 tablet, Rfl: 1 .  oxyCODONE (OXY IR/ROXICODONE) 5 MG immediate release tablet, Take 1 tablet (5 mg total) by mouth every 4 (four) hours as needed for moderate pain., Disp: 15 tablet, Rfl: 0 .  pregabalin (LYRICA) 75 MG capsule, Take 1 capsule (75 mg total) by mouth daily., Disp: 20 capsule, Rfl: 1 .  venlafaxine XR (EFFEXOR-XR) 37.5 MG 24 hr capsule, TAKE 1 CAPSULE(37.5 MG) BY MOUTH DAILY WITH BREAKFAST, Disp: 30 capsule, Rfl: 0   Past Medical History  Diagnosis Date  . Anemia, unspecified   . Dehydration   . Depressive disorder, not elsewhere classified   . Esophageal reflux   . Unspecified essential hypertension   . Unspecified menopausal and postmenopausal disorder   . Obesity, unspecified   . Inflammatory and toxic neuropathy, unspecified   . Unspecified vitamin D deficiency   . Dysphagia 2007    historyof dysphagia with severe dysmotility by barium swallow-[name]  . Obesity   . Hypertension   . Allergic bronchitis   . Complication of anesthesia     "I've had recall; I probably stopped breathing at least 2 times" (09/03/2014)  .  Family history of adverse reaction to anesthesia     "my brother was told they lost him a couple times during OR"  . OSA (obstructive sleep apnea)     "stopped using my CPAP; just couldn't use it cause it kept me awake and I couldn't keep it on my face'"(09/03/2014)  . Heart murmur   . Chronic airway obstruction, not elsewhere classified     "I was told I don't have this/tests done @ [facility] 05/2014"  . Extrinsic asthma, unspecified     "I was told I don't have this/tests done @ [facility] 05/2014"  . Type II diabetes mellitus ([place])   . History of hiatal hernia   . Headache     "weekly to monthly" (09/03/2014)  . Migraine     "couple times/month right now" (09/03/2014)  . Spondylosis of unspecified site without mention of myelopathy   . Arthritis     "neck, back, hands" (09/03/2014)  . Diabetic peripheral neuropathy ([place])   . Chronic back pain   . Allergy   .  Anxiety     Allergies  Allergen Reactions  . Ace Inhibitors Anaphylaxis  . Penicillins Hives and Rash  . Adhesive [Tape] Rash  . Tape Hives    Paper tape is ok to use    Review of Systems Constitutional:   No  weight loss, night sweats, + Fevers, chills,+ fatigue, or  lassitude.  HEENT:   + headaches, No  Difficulty swallowing,  Tooth/dental problems, or  Sore throat,                + sneezing, no itching, ear ache, nasal congestion, post nasal drip,   CV:  No chest pain,  Orthopnea, PND, swelling in lower extremities, anasarca, dizziness, palpitations, syncope.   GI  No heartburn, indigestion, abdominal pain, nausea, vomiting, diarrhea, change in bowel habits, loss of appetite, bloody stools.   Resp: + shortness of breath with exertion not at rest.  + excess mucus, + productive cough,  + non-productive cough,  No coughing up of blood.  No change in color of mucus.  No wheezing.  No chest wall deformity Denies chest pain.  Skin: no rash or lesions.  GU: no dysuria, change in color of urine, no urgency or frequency.  No  flank pain, no hematuria   MS:  No joint pain or swelling.  No decreased range of motion.  No back pain.  Psych:  No change in mood or affect. No depression or anxiety.  No memory loss.        Objective:   Physical Exam   BP 104/66 mmHg  Pulse 81  Temp(Src) 97.6 F (36.4 C) (Oral)  Ht 5' 4.25" (1.632 m)  Wt 311 lb 9.6 oz (141.341 kg)  BMI 53.07 kg/m2  SpO2 96%  Physical Exam:  General- No distress,  A&Ox3, obese, lethargic  female ENT: No sinus tenderness, TM clear, pale nasal mucosa, no oral exudate,no post nasal drip, no LAN Cardiac: S1, S2, regular rate and rhythm, no murmur Chest: No wheeze/ rales/ dullness;diminished bilaterally in the bases, no accessory muscle use, no nasal flaring, no sternal retractions Abd.: Soft Non-tender Ext: No clubbing cyanosis, +trace edema, no pain or  tenderness  Neuro:  normal strength Skin: No rashes, warm and dry Psych: normal mood and behavior       Assessment & Plan:

## 2015-07-31 NOTE — Telephone Encounter (Signed)
Spoke with the pt  She states having increased cough and congestion  OV with [name] at 12 noon today Nothing further needed

## 2015-08-01 NOTE — Telephone Encounter (Signed)
Result Notes     Notes Recorded by Magdalen Spatz, NP on 07/31/2015 at 5:33 PM Please call the patient and let her know her CXR was normal. Thanks so much.   Pt is aware of results. Nothing further was needed.

## 2015-08-02 ENCOUNTER — Ambulatory Visit: Payer: Medicare Other | Admitting: Emergency Medicine

## 2015-08-05 ENCOUNTER — Telehealth: Payer: Self-pay | Admitting: Endocrinology

## 2015-08-05 MED ORDER — ONETOUCH ULTRA 2 W/DEVICE KIT: PACK | Status: DC

## 2015-08-05 NOTE — Telephone Encounter (Signed)
Rx submitted per pt's request.  

## 2015-08-05 NOTE — Telephone Encounter (Signed)
Patient stated that she lost her meter and need a new one, please advise

## 2015-08-06 DIAGNOSIS — G4730 Sleep apnea, unspecified: Secondary | ICD-10-CM | POA: Diagnosis not present

## 2015-08-06 DIAGNOSIS — E1342 Other specified diabetes mellitus with diabetic polyneuropathy: Secondary | ICD-10-CM | POA: Diagnosis not present

## 2015-08-06 DIAGNOSIS — I10 Essential (primary) hypertension: Secondary | ICD-10-CM | POA: Diagnosis not present

## 2015-08-06 DIAGNOSIS — G4750 Parasomnia, unspecified: Secondary | ICD-10-CM | POA: Diagnosis not present

## 2015-08-06 DIAGNOSIS — F338 Other recurrent depressive disorders: Secondary | ICD-10-CM | POA: Diagnosis not present

## 2015-08-06 DIAGNOSIS — I517 Cardiomegaly: Secondary | ICD-10-CM | POA: Diagnosis not present

## 2015-08-13 ENCOUNTER — Ambulatory Visit: Payer: Medicare Other | Admitting: Internal Medicine

## 2015-08-14 ENCOUNTER — Telehealth: Payer: Self-pay | Admitting: Endocrinology

## 2015-08-14 ENCOUNTER — Telehealth: Payer: Self-pay

## 2015-08-14 NOTE — Telephone Encounter (Signed)
Patient stated that her b/s has been running high, Mon it was 376  Low was 230, please advise

## 2015-08-14 NOTE — Telephone Encounter (Signed)
please call patient: Please have appt in 1-2 days

## 2015-08-14 NOTE — Telephone Encounter (Signed)
A user error has taken place.

## 2015-08-14 NOTE — Telephone Encounter (Signed)
See note below and please advise, Thanks! 

## 2015-08-15 ENCOUNTER — Encounter: Payer: Self-pay | Admitting: Adult Health

## 2015-08-15 ENCOUNTER — Encounter: Payer: Self-pay | Admitting: Podiatry

## 2015-08-15 ENCOUNTER — Other Ambulatory Visit: Payer: Self-pay | Admitting: Internal Medicine

## 2015-08-15 ENCOUNTER — Ambulatory Visit (INDEPENDENT_AMBULATORY_CARE_PROVIDER_SITE_OTHER): Payer: PPO | Admitting: Podiatry

## 2015-08-15 ENCOUNTER — Ambulatory Visit (INDEPENDENT_AMBULATORY_CARE_PROVIDER_SITE_OTHER): Payer: PPO | Admitting: Adult Health

## 2015-08-15 VITALS — BP 108/62 | HR 82 | Temp 97.7°F | Ht 64.0 in | Wt 305.0 lb

## 2015-08-15 DIAGNOSIS — M79671 Pain in right foot: Secondary | ICD-10-CM

## 2015-08-15 DIAGNOSIS — M79672 Pain in left foot: Secondary | ICD-10-CM

## 2015-08-15 DIAGNOSIS — E1342 Other specified diabetes mellitus with diabetic polyneuropathy: Secondary | ICD-10-CM

## 2015-08-15 DIAGNOSIS — R05 Cough: Secondary | ICD-10-CM | POA: Diagnosis not present

## 2015-08-15 DIAGNOSIS — J189 Pneumonia, unspecified organism: Secondary | ICD-10-CM

## 2015-08-15 DIAGNOSIS — R059 Cough, unspecified: Secondary | ICD-10-CM

## 2015-08-15 DIAGNOSIS — M79605 Pain in left leg: Principal | ICD-10-CM

## 2015-08-15 DIAGNOSIS — B351 Tinea unguium: Secondary | ICD-10-CM

## 2015-08-15 DIAGNOSIS — Q828 Other specified congenital malformations of skin: Secondary | ICD-10-CM

## 2015-08-15 DIAGNOSIS — M79604 Pain in right leg: Secondary | ICD-10-CM

## 2015-08-15 MED ORDER — HYDROCODONE-HOMATROPINE 5-1.5 MG/5ML PO SYRP: 5.0000 mL | ORAL_SOLUTION | Freq: Four times a day (QID) | ORAL | Status: DC | PRN

## 2015-08-15 NOTE — Assessment & Plan Note (Signed)
Resolving PNA , clinically improving .   Plan  Continue on current regimen  Mucinex DM Twice daily  As needed  Cough/congestion  Hydromet 1 tsp every 6hr as needed , may make you sleepy-use short term .  Follow up Dr. Lamonte Sakai  In 2 months and As needed   Please contact office for sooner follow up if symptoms do not improve or worsen or seek emergency care

## 2015-08-15 NOTE — Patient Instructions (Signed)
Continue on current regimen  Mucinex DM Twice daily  As needed  Cough/congestion  Hydromet 1 tsp every 6hr as needed , may make you sleepy-use short term .  Follow up Dr. Lamonte Sakai  In 2 months and As needed   Please contact office for sooner follow up if symptoms do not improve or worsen or seek emergency care

## 2015-08-15 NOTE — Assessment & Plan Note (Signed)
Cont w/ cough control measures Use hydromet with caution

## 2015-08-15 NOTE — Progress Notes (Signed)
Subjective:    Patient ID: Christy May, female    DOB: 1950/10/03, 65 y.o.   MRN: [id number]  HPI 65 yo female former smoker with OSA on CPAP , VCD  /Chronic cough    08/15/2015 Follow up : PNA and Cough  Pt returns for 2 week follow up  Recently admitted for CAP , tx w/ abx .  CXR on 2.8 showed resolution  She is feeling some better. Cough is decreasing. She was started on hydromet which has helped a lot .  She only had 4 oz and says she is out now.  We discussed sedating effect and addicting issues. She verbalizes understaing.  She denies chest pain, orthopnea, edema  , hemoptysis , or edema  Still tired and low energy.   Doing well on CPAP . Wears 6-8 hr night .  No mask issues.    Past Medical History  Diagnosis Date  . Anemia, unspecified   . Dehydration   . Depressive disorder, not elsewhere classified   . Esophageal reflux   . Unspecified essential hypertension   . Unspecified menopausal and postmenopausal disorder   . Obesity, unspecified   . Inflammatory and toxic neuropathy, unspecified   . Unspecified vitamin D deficiency   . Dysphagia 2007    historyof dysphagia with severe dysmotility by barium swallow-[name]  . Obesity   . Hypertension   . Allergic bronchitis   . Complication of anesthesia     "I've had recall; I probably stopped breathing at least 2 times" (09/03/2014)  . Family history of adverse reaction to anesthesia     "my brother was told they lost him a couple times during OR"  . OSA (obstructive sleep apnea)     "stopped using my CPAP; just couldn't use it cause it kept me awake and I couldn't keep it on my face'"(09/03/2014)  . Heart murmur   . Chronic airway obstruction, not elsewhere classified     "I was told I don't have this/tests done @ [facility] 05/2014"  . Extrinsic asthma, unspecified     "I was told I don't have this/tests done @ [facility] 05/2014"  . Type II diabetes mellitus ([place])   . History of hiatal hernia   . Headache       "weekly to monthly" (09/03/2014)  . Migraine     "couple times/month right now" (09/03/2014)  . Spondylosis of unspecified site without mention of myelopathy   . Arthritis     "neck, back, hands" (09/03/2014)  . Diabetic peripheral neuropathy ([place])   . Chronic back pain   . Allergy   . Anxiety    Current Outpatient Prescriptions on File Prior to Visit  Medication Sig Dispense Refill  . albuterol (PROVENTIL) (2.5 MG/3ML) 0.083% nebulizer solution Take 2.5 mg by nebulization every 6 (six) hours as needed for wheezing or shortness of breath.    . allopurinol (ZYLOPRIM) 300 MG tablet Take 1 tablet (300 mg total) by mouth daily. 30 tablet 6  . aspirin 81 MG tablet Take 81 mg by mouth at bedtime.     . Blood Glucose Monitoring Suppl (ONE TOUCH ULTRA 2) w/Device KIT Use to check blood sugar 2 times per day. Dx Code: E11.9 1 each 2  . cholecalciferol (VITAMIN D) 1000 UNITS tablet Take 2,000 Units by mouth every morning.     . famotidine (PEPCID) 20 MG tablet TAKE 1 TABLET BY MOUTH EVERY NIGHT AT BEDTIME 30 tablet 5  . fluticasone (FLONASE) 50 MCG/ACT  nasal spray Place 2 sprays into both nostrils every morning. 16 g 6  . folic acid (FOLVITE) 1 MG tablet Take 4 tablets (4 mg total) by mouth daily. 120 tablet 11  . gabapentin (NEURONTIN) 800 MG tablet Take 1 tablet (800 mg total) by mouth 3 (three) times daily. 270 tablet 1  . glucosamine-chondroitin 500-400 MG tablet Take 1 tablet by mouth 3 (three) times daily.    Marland Kitchen glucose blood (ONE TOUCH ULTRA TEST) test strip USE TO TEST BLOOD SUGAR TWICE DAILY. DX: E10.41 100 each 5  . HYDROcodone-homatropine (HYCODAN) 5-1.5 MG/5ML syrup Take 5 mLs by mouth every 6 (six) hours as needed for cough. 120 mL 0  . insulin NPH Human (HUMULIN N) 100 UNIT/ML injection Inject 1.7 mLs (170 Units total) into the skin daily before breakfast. And syringes 2/day (Patient taking differently: Inject 140 Units into the skin daily before breakfast. And syringes 2/day) 60 mL 3   . Lancets (ONETOUCH ULTRASOFT) lancets Use to test blood sugars twice a day. 100 each 12  . loratadine (CLARITIN) 10 MG tablet TAKE 1 TABLET BY MOUTH EVERY DAY 30 tablet 5  . metoprolol (LOPRESSOR) 50 MG tablet Take 1.5 tablets (75 mg total) by mouth 2 (two) times daily. 270 tablet 3  . montelukast (SINGULAIR) 10 MG tablet TAKE 1 TABLET BY MOUTH DAILY 90 tablet 0  . nystatin cream (MYCOSTATIN) APPLY TO AFFECTED AREA TWICE DAILY (Patient not taking: Reported on 07/31/2015) 30 g 0  . Omega-3 Fatty Acids (FISH OIL) 1200 MG CPDR Take 1,200 mg by mouth daily.    Marland Kitchen omeprazole (PRILOSEC) 40 MG capsule TAKE 1 CAPSULE(40 MG) BY MOUTH DAILY 30 capsule 5  . oxybutynin (DITROPAN-XL) 10 MG 24 hr tablet Take 1 tablet (10 mg total) by mouth daily. 90 tablet 1   No current facility-administered medications on file prior to visit.      Review of Systems Constitutional:   No  weight loss, night sweats,  Fevers, chills, + fatigue, or  lassitude.  HEENT:   No headaches,  Difficulty swallowing,  Tooth/dental problems, or  Sore throat,                No sneezing, itching, ear ache, nasal congestion, post nasal drip,   CV:  No chest pain,  Orthopnea, PND, swelling in lower extremities, anasarca, dizziness, palpitations, syncope.   GI  No heartburn, indigestion, abdominal pain, nausea, vomiting, diarrhea, change in bowel habits, loss of appetite, bloody stools.   Resp:  .  No chest wall deformity  Skin: no rash or lesions.  GU: no dysuria, change in color of urine, no urgency or frequency.  No flank pain, no hematuria   MS:  No joint pain or swelling.  No decreased range of motion.  No back pain.  Psych:  No change in mood or affect. No depression or anxiety.  No memory loss.         Objective:   Physical Exam Filed Vitals:   08/15/15 1405  BP: 108/62  Pulse: 82  Temp: 97.7 F (36.5 C)  TempSrc: Oral  Height: '5\' 4"'  (1.626 m)  Weight: 305 lb (138.347 kg)  SpO2: 96%    Body mass index is  52.33 kg/(m^2).   GEN: A/Ox3; pleasant , NAD, morbidly obese   HEENT:  [place]/AT,  EACs-clear, TMs-wnl, NOSE-clear, THROAT-clear, no lesions, no postnasal drip or exudate noted.   NECK:  Supple w/ fair ROM; no JVD; normal carotid impulses w/o bruits; no thyromegaly or  nodules palpated; no lymphadenopathy.  RESP  Decreased BS in bases , no accessory muscle use, no dullness to percussion  CARD:  RRR, no m/r/g  , no peripheral edema, pulses intact, no cyanosis or clubbing.  GI:   Soft & nt; nml bowel sounds; no organomegaly or masses detected.  Musco: Warm bil, no deformities or joint swelling noted.   Neuro: alert, no focal deficits noted.    Skin: Warm, no lesions or rashes        Assessment & Plan:

## 2015-08-16 ENCOUNTER — Ambulatory Visit: Payer: PPO | Admitting: Internal Medicine

## 2015-08-16 NOTE — Telephone Encounter (Signed)
Apt scheduled for Tues the 28th at 8 am.    KP

## 2015-08-18 NOTE — Progress Notes (Signed)
Subjective:     Patient ID: Christy May, female   DOB: 1950-12-05, 65 y.o.   MRN: [id number]  HPI patient presents with nail disease 1-5 both feet that are thick yellow brittle and painful   Review of Systems     Objective:   Physical Exam Yellow brittle nailbeds 1-5 both feet that when palpated are painful with obese female who cannot take care of them herself    Assessment:     Mycotic nail infection with pain 1-5 both feet    Plan:     Debride painful nailbeds 1-5 both feet with no iatrogenic bleeding noted

## 2015-08-20 ENCOUNTER — Encounter: Payer: Self-pay | Admitting: Endocrinology

## 2015-08-20 ENCOUNTER — Ambulatory Visit (INDEPENDENT_AMBULATORY_CARE_PROVIDER_SITE_OTHER): Payer: PPO | Admitting: Endocrinology

## 2015-08-20 VITALS — BP 136/94 | HR 83 | Temp 97.8°F | Ht 64.0 in | Wt 311.0 lb

## 2015-08-20 DIAGNOSIS — I517 Cardiomegaly: Secondary | ICD-10-CM | POA: Diagnosis not present

## 2015-08-20 DIAGNOSIS — E1342 Other specified diabetes mellitus with diabetic polyneuropathy: Secondary | ICD-10-CM | POA: Diagnosis not present

## 2015-08-20 DIAGNOSIS — E1122 Type 2 diabetes mellitus with diabetic chronic kidney disease: Secondary | ICD-10-CM | POA: Diagnosis not present

## 2015-08-20 DIAGNOSIS — Z794 Long term (current) use of insulin: Secondary | ICD-10-CM

## 2015-08-20 DIAGNOSIS — N183 Chronic kidney disease, stage 3 (moderate): Secondary | ICD-10-CM

## 2015-08-20 DIAGNOSIS — K5710 Diverticulosis of small intestine without perforation or abscess without bleeding: Secondary | ICD-10-CM | POA: Diagnosis not present

## 2015-08-20 DIAGNOSIS — G4730 Sleep apnea, unspecified: Secondary | ICD-10-CM | POA: Diagnosis not present

## 2015-08-20 DIAGNOSIS — I10 Essential (primary) hypertension: Secondary | ICD-10-CM | POA: Diagnosis not present

## 2015-08-20 DIAGNOSIS — F338 Other recurrent depressive disorders: Secondary | ICD-10-CM | POA: Diagnosis not present

## 2015-08-20 MED ORDER — INSULIN NPH (HUMAN) (ISOPHANE) 100 UNIT/ML ~~LOC~~ SUSP: 170.0000 [IU] | SUBCUTANEOUS | Status: DC

## 2015-08-20 NOTE — Progress Notes (Signed)
Subjective:    Patient ID: Christy May, female    DOB: 16-Jan-1951, 65 y.o.   MRN: [id number]  HPI Pt returns for f/u of diabetes mellitus: DM type: Insulin-requiring type 2 Dx'ed: 3646 Complications: polyneuropathy and renal insufficiency Therapy: insulin since 2002.   GDM: never DKA: never Severe hypoglycemia: never. Pancreatitis: never Other: she was changed to a qd insulin regimen, after poor results on multiple daily injections; she wants the cheapest possible insulin.  She declines weight loss surgery.   Interval history: she has been off prednisone x 1 week.  no cbg record, but states cbg's are in the 200's.  She takes 170 units qam Past Medical History  Diagnosis Date  . Anemia, unspecified   . Dehydration   . Depressive disorder, not elsewhere classified   . Esophageal reflux   . Unspecified essential hypertension   . Unspecified menopausal and postmenopausal disorder   . Obesity, unspecified   . Inflammatory and toxic neuropathy, unspecified   . Unspecified vitamin D deficiency   . Dysphagia 2007    historyof dysphagia with severe dysmotility by barium swallow-[name]  . Obesity   . Hypertension   . Allergic bronchitis   . Complication of anesthesia     "I've had recall; I probably stopped breathing at least 2 times" (09/03/2014)  . Family history of adverse reaction to anesthesia     "my brother was told they lost him a couple times during OR"  . OSA (obstructive sleep apnea)     "stopped using my CPAP; just couldn't use it cause it kept me awake and I couldn't keep it on my face'"(09/03/2014)  . Heart murmur   . Chronic airway obstruction, not elsewhere classified     "I was told I don't have this/tests done @ [facility] 05/2014"  . Extrinsic asthma, unspecified     "I was told I don't have this/tests done @ [facility] 05/2014"  . Type II diabetes mellitus ([place])   . History of hiatal hernia   . Headache     "weekly to monthly" (09/03/2014)  . Migraine    "couple times/month right now" (09/03/2014)  . Spondylosis of unspecified site without mention of myelopathy   . Arthritis     "neck, back, hands" (09/03/2014)  . Diabetic peripheral neuropathy ([place])   . Chronic back pain   . Allergy   . Anxiety     Past Surgical History  Procedure Laterality Date  . Refractive surgery Bilateral   . Anterior cervical decomp/discectomy fusion      multiple cervical spine levels;Dr. Arnoldo Morale  . Incision and drainage abscess      Chest  . Dilation and curettage of uterus    . Exploratory laparotomy    . Total knee arthroplasty Left 10/2003  . Total knee arthroplasty Right 01/2004    [name]  . Foot surgery Right X 2    "took blood out of my arms and put platelets in my feet"  . Appendectomy    . Tonsillectomy    . Esophagogastroduodenoscopy N/A 05/08/2014    Procedure: ESOPHAGOGASTRODUODENOSCOPY (EGD);  Surgeon: [name], MD;  Location: [name];  Service: Endoscopy;  Laterality: N/A;  . Colonoscopy N/A 05/08/2014    Procedure: COLONOSCOPY;  Surgeon: [name], MD;  Location: [facility];  Service: Endoscopy;  Laterality: N/A;  . Back surgery    . Cholecystectomy open    . Abdominal hysterectomy      "partial"  . Eye surgery    .  Joint replacement      Social History   Social History  . Marital Status: Widowed    Spouse Name: N/A  . Number of Children: N/A  . Years of Education: N/A   Occupational History  . disabled    Social History Main Topics  . Smoking status: Former Smoker -- 0.00 packs/day for 0 years    Types: Cigarettes    Quit date: 06/22/1986  . Smokeless tobacco: Never Used  . Alcohol Use: Yes  . Drug Use: No  . Sexual Activity: Not on file   Other Topics Concern  . Not on file   Social History Narrative   ** Merged History Encounter **       Patient does not get regular exercise.      Widowed 2004      Disabled    Current Outpatient Prescriptions on File Prior to Visit  Medication Sig  Dispense Refill  . albuterol (PROVENTIL) (2.5 MG/3ML) 0.083% nebulizer solution Take 2.5 mg by nebulization every 6 (six) hours as needed for wheezing or shortness of breath.    . allopurinol (ZYLOPRIM) 300 MG tablet Take 1 tablet (300 mg total) by mouth daily. 30 tablet 6  . aspirin 81 MG tablet Take 81 mg by mouth at bedtime.     . Blood Glucose Monitoring Suppl (ONE TOUCH ULTRA 2) w/Device KIT Use to check blood sugar 2 times per day. Dx Code: E11.9 1 each 2  . cholecalciferol (VITAMIN D) 1000 UNITS tablet Take 2,000 Units by mouth every morning.     . famotidine (PEPCID) 20 MG tablet TAKE 1 TABLET BY MOUTH EVERY NIGHT AT BEDTIME 30 tablet 5  . fluticasone (FLONASE) 50 MCG/ACT nasal spray Place 2 sprays into both nostrils every morning. 16 g 6  . folic acid (FOLVITE) 1 MG tablet Take 4 tablets (4 mg total) by mouth daily. 120 tablet 11  . gabapentin (NEURONTIN) 800 MG tablet Take 1 tablet (800 mg total) by mouth 3 (three) times daily. 270 tablet 1  . glucosamine-chondroitin 500-400 MG tablet Take 1 tablet by mouth 3 (three) times daily.    Marland Kitchen glucose blood (ONE TOUCH ULTRA TEST) test strip USE TO TEST BLOOD SUGAR TWICE DAILY. DX: E10.41 100 each 5  . HYDROcodone-homatropine (HYDROMET) 5-1.5 MG/5ML syrup Take 5 mLs by mouth every 6 (six) hours as needed. 240 mL 0  . irbesartan (AVAPRO) 300 MG tablet TAKE 1 TABLET(300 MG) BY MOUTH DAILY 90 tablet 0  . Lancets (ONETOUCH ULTRASOFT) lancets Use to test blood sugars twice a day. 100 each 12  . loratadine (CLARITIN) 10 MG tablet TAKE 1 TABLET BY MOUTH EVERY DAY 30 tablet 5  . metoprolol (LOPRESSOR) 50 MG tablet Take 1.5 tablets (75 mg total) by mouth 2 (two) times daily. 270 tablet 3  . montelukast (SINGULAIR) 10 MG tablet TAKE 1 TABLET BY MOUTH DAILY 90 tablet 0  . nystatin cream (MYCOSTATIN) APPLY TO AFFECTED AREA TWICE DAILY 30 g 0  . Omega-3 Fatty Acids (FISH OIL) 1200 MG CPDR Take 1,200 mg by mouth daily.    Marland Kitchen omeprazole (PRILOSEC) 40 MG capsule  TAKE 1 CAPSULE(40 MG) BY MOUTH DAILY 30 capsule 5  . oxybutynin (DITROPAN-XL) 10 MG 24 hr tablet Take 1 tablet (10 mg total) by mouth daily. 90 tablet 1   No current facility-administered medications on file prior to visit.    Allergies  Allergen Reactions  . Ace Inhibitors Anaphylaxis  . Penicillins Hives and Rash  . Adhesive [  Tape] Rash  . Tape Hives    Paper tape is ok to use    Family History  Problem Relation Age of Onset  . Esophageal cancer Brother   . Esophageal cancer Sister     ?  . Colon polyps Brother   . Pancreatic cancer Sister     ?  . Diabetes Sister   . Diabetes      Aunt and Uncle  . Heart disease Maternal Grandfather     BP 136/94 mmHg  Pulse 83  Temp(Src) 97.8 F (36.6 C) (Oral)  Ht '5\' 4"'  (1.626 m)  Wt 311 lb (141.069 kg)  BMI 53.36 kg/m2  SpO2 97%  Review of Systems She denies hypoglycemia    Objective:   Physical Exam VITAL SIGNS:  See vs page GENERAL: no distress Pulses: dorsalis pedis intact bilat.   MSK: no deformity of the feet CV: 1+ bilat leg edema.   Skin:  no ulcer on the feet.  normal color and temp on the feet. Neuro: sensation is intact to touch on the feet, but decreased from normal.   Lab Results  Component Value Date   HGBA1C 8.1* 07/14/2015      Assessment & Plan:  DM: this is the best control this pt should aim for, given this regimen, which does match insulin to her changing needs throughout the day  Patient is advised the following: Patient Instructions  Please continue the same insulin (170 units each morning).  On this type of insulin schedule, you should eat meals on a regular schedule.  If a meal is missed or significantly delayed, your blood sugar could go low. check your blood sugar twice a day.  vary the time of day when you check, between before the 3 meals, and at bedtime.  also check if you have symptoms of your blood sugar being too high or too low.  please keep a record of the readings and bring it to  your next appointment here.  You can write it on any piece of paper.  please call us sooner if your blood sugar goes below 70, or if you have a lot of readings over 200. Please come back for a follow-up appointment in 2 months.

## 2015-08-20 NOTE — Patient Instructions (Addendum)
Please continue the same insulin (170 units each morning).  On this type of insulin schedule, you should eat meals on a regular schedule.  If a meal is missed or significantly delayed, your blood sugar could go low. check your blood sugar twice a day.  vary the time of day when you check, between before the 3 meals, and at bedtime.  also check if you have symptoms of your blood sugar being too high or too low.  please keep a record of the readings and bring it to your next appointment here.  You can write it on any piece of paper.  please call us sooner if your blood sugar goes below 70, or if you have a lot of readings over 200. Please come back for a follow-up appointment in 2 months.

## 2015-08-21 DIAGNOSIS — E119 Type 2 diabetes mellitus without complications: Secondary | ICD-10-CM | POA: Insufficient documentation

## 2015-08-27 ENCOUNTER — Telehealth: Payer: Self-pay | Admitting: Emergency Medicine

## 2015-08-27 NOTE — Telephone Encounter (Signed)
Suspect she has a URI. Would recommend rest, fluids, OTC decongestant like Tylenol cold and Flu. Continue the mucinex and cough syrup. If continues she needs to be seen, assess for allergies etc.

## 2015-08-27 NOTE — Telephone Encounter (Signed)
Spoke with pt, c/o body weakness, fatigue, prod cough with clear mucus X3 days.   Pt denies fever, sinus congestion, body aches, chills. Pt has been taking mucinex and hydromet.  Pt requesting further recs.    Pt uses [facility] on [address].    RB please advise on recs.  Thanks!

## 2015-08-27 NOTE — Telephone Encounter (Signed)
Called and spoke with pt. Reviewed recs. Pt voiced understanding and had no further questions.  

## 2015-09-09 ENCOUNTER — Telehealth: Payer: Self-pay | Admitting: Emergency Medicine

## 2015-09-09 MED ORDER — PREDNISONE 20 MG PO TABS: 20.0000 mg | ORAL_TABLET | Freq: Every day | ORAL | Status: DC

## 2015-09-09 MED ORDER — TRAMADOL HCL 50 MG PO TABS: 50.0000 mg | ORAL_TABLET | Freq: Four times a day (QID) | ORAL | Status: DC | PRN

## 2015-09-09 NOTE — Telephone Encounter (Signed)
Spoke with pt, c/o headache, weakness, severe sometimes prod cough with clear mucus- coughing until she wets herself.  States her sinuses are congested, not chest.  S/s present X3-4 days.  Denies chills, fever, chest pain. Pt has been taking maintenance meds-nothing else to help with s/s.      Pt uses [facility] on [address].     Sending to DOD as RB is unavailable.  CY please advise on recs.  Thanks!  Allergies  Allergen Reactions  . Ace Inhibitors Anaphylaxis  . Penicillins Hives and Rash  . Adhesive [Tape] Rash  . Tape Hives    Paper tape is ok to use   Current Outpatient Prescriptions on File Prior to Visit  Medication Sig Dispense Refill  . albuterol (PROVENTIL) (2.5 MG/3ML) 0.083% nebulizer solution Take 2.5 mg by nebulization every 6 (six) hours as needed for wheezing or shortness of breath.    . allopurinol (ZYLOPRIM) 300 MG tablet Take 1 tablet (300 mg total) by mouth daily. 30 tablet 6  . aspirin 81 MG tablet Take 81 mg by mouth at bedtime.     . Blood Glucose Monitoring Suppl (ONE TOUCH ULTRA 2) w/Device KIT Use to check blood sugar 2 times per day. Dx Code: E11.9 1 each 2  . cholecalciferol (VITAMIN D) 1000 UNITS tablet Take 2,000 Units by mouth every morning.     . famotidine (PEPCID) 20 MG tablet TAKE 1 TABLET BY MOUTH EVERY NIGHT AT BEDTIME 30 tablet 5  . fluticasone (FLONASE) 50 MCG/ACT nasal spray Place 2 sprays into both nostrils every morning. 16 g 6  . folic acid (FOLVITE) 1 MG tablet Take 4 tablets (4 mg total) by mouth daily. 120 tablet 11  . gabapentin (NEURONTIN) 800 MG tablet Take 1 tablet (800 mg total) by mouth 3 (three) times daily. 270 tablet 1  . glucosamine-chondroitin 500-400 MG tablet Take 1 tablet by mouth 3 (three) times daily.    Marland Kitchen glucose blood (ONE TOUCH ULTRA TEST) test strip USE TO TEST BLOOD SUGAR TWICE DAILY. DX: E10.41 100 each 5  . HYDROcodone-homatropine (HYDROMET) 5-1.5 MG/5ML syrup Take 5 mLs by mouth every 6 (six) hours as needed. 240 mL 0   . insulin NPH Human (HUMULIN N) 100 UNIT/ML injection Inject 1.7 mLs (170 Units total) into the skin every morning. And syringes 2/day 60 mL 3  . irbesartan (AVAPRO) 300 MG tablet TAKE 1 TABLET(300 MG) BY MOUTH DAILY 90 tablet 0  . Lancets (ONETOUCH ULTRASOFT) lancets Use to test blood sugars twice a day. 100 each 12  . loratadine (CLARITIN) 10 MG tablet TAKE 1 TABLET BY MOUTH EVERY DAY 30 tablet 5  . metoprolol (LOPRESSOR) 50 MG tablet Take 1.5 tablets (75 mg total) by mouth 2 (two) times daily. 270 tablet 3  . montelukast (SINGULAIR) 10 MG tablet TAKE 1 TABLET BY MOUTH DAILY 90 tablet 0  . nystatin cream (MYCOSTATIN) APPLY TO AFFECTED AREA TWICE DAILY 30 g 0  . Omega-3 Fatty Acids (FISH OIL) 1200 MG CPDR Take 1,200 mg by mouth daily.    Marland Kitchen omeprazole (PRILOSEC) 40 MG capsule TAKE 1 CAPSULE(40 MG) BY MOUTH DAILY 30 capsule 5  . oxybutynin (DITROPAN-XL) 10 MG 24 hr tablet Take 1 tablet (10 mg total) by mouth daily. 90 tablet 1   No current facility-administered medications on file prior to visit.

## 2015-09-09 NOTE — Telephone Encounter (Signed)
Offer tramadol 50 mg, # 30, 1 every 6 hours prn cough  Can also use otc like Delsym cough syrup in between           Prednisone 20 mg, # 5, 1 daily

## 2015-09-09 NOTE — Telephone Encounter (Signed)
Spoke with the pt and notified of recs per CDY  She verbalized understanding and rxs were called to pharm

## 2015-09-12 DIAGNOSIS — G4730 Sleep apnea, unspecified: Secondary | ICD-10-CM | POA: Diagnosis not present

## 2015-09-12 DIAGNOSIS — I517 Cardiomegaly: Secondary | ICD-10-CM | POA: Diagnosis not present

## 2015-09-12 DIAGNOSIS — I110 Hypertensive heart disease with heart failure: Secondary | ICD-10-CM | POA: Diagnosis not present

## 2015-09-17 ENCOUNTER — Ambulatory Visit: Payer: Medicare Other | Admitting: Endocrinology

## 2015-09-23 ENCOUNTER — Other Ambulatory Visit: Payer: Self-pay | Admitting: Emergency Medicine

## 2015-09-24 ENCOUNTER — Other Ambulatory Visit: Payer: Self-pay | Admitting: Internal Medicine

## 2015-09-25 NOTE — Telephone Encounter (Signed)
CY please advise on refill. Thanks. 

## 2015-09-26 NOTE — Telephone Encounter (Signed)
This should go to Dr Lamonte Sakai or doc of day

## 2015-09-26 NOTE — Telephone Encounter (Signed)
RB please advise on refill. Thanks  

## 2015-09-27 DIAGNOSIS — R9431 Abnormal electrocardiogram [ECG] [EKG]: Secondary | ICD-10-CM | POA: Diagnosis not present

## 2015-09-27 DIAGNOSIS — J399 Disease of upper respiratory tract, unspecified: Secondary | ICD-10-CM | POA: Diagnosis not present

## 2015-09-27 DIAGNOSIS — E1342 Other specified diabetes mellitus with diabetic polyneuropathy: Secondary | ICD-10-CM | POA: Diagnosis not present

## 2015-09-27 DIAGNOSIS — I110 Hypertensive heart disease with heart failure: Secondary | ICD-10-CM | POA: Diagnosis not present

## 2015-09-27 DIAGNOSIS — I517 Cardiomegaly: Secondary | ICD-10-CM | POA: Diagnosis not present

## 2015-09-27 DIAGNOSIS — I313 Pericardial effusion (noninflammatory): Secondary | ICD-10-CM | POA: Diagnosis not present

## 2015-10-02 ENCOUNTER — Other Ambulatory Visit: Payer: Self-pay

## 2015-10-02 DIAGNOSIS — Z1231 Encounter for screening mammogram for malignant neoplasm of breast: Secondary | ICD-10-CM

## 2015-10-08 ENCOUNTER — Encounter: Payer: Self-pay | Admitting: Emergency Medicine

## 2015-10-08 ENCOUNTER — Ambulatory Visit (INDEPENDENT_AMBULATORY_CARE_PROVIDER_SITE_OTHER): Payer: PPO | Admitting: Emergency Medicine

## 2015-10-08 VITALS — BP 124/74 | HR 90 | Ht 64.75 in | Wt 305.0 lb

## 2015-10-08 DIAGNOSIS — M654 Radial styloid tenosynovitis [de Quervain]: Secondary | ICD-10-CM | POA: Diagnosis not present

## 2015-10-08 DIAGNOSIS — G4733 Obstructive sleep apnea (adult) (pediatric): Secondary | ICD-10-CM | POA: Diagnosis not present

## 2015-10-08 DIAGNOSIS — R05 Cough: Secondary | ICD-10-CM

## 2015-10-08 DIAGNOSIS — G5602 Carpal tunnel syndrome, left upper limb: Secondary | ICD-10-CM | POA: Diagnosis not present

## 2015-10-08 DIAGNOSIS — G5601 Carpal tunnel syndrome, right upper limb: Secondary | ICD-10-CM | POA: Diagnosis not present

## 2015-10-08 DIAGNOSIS — R059 Cough, unspecified: Secondary | ICD-10-CM

## 2015-10-08 MED ORDER — PREDNISONE 20 MG PO TABS: 40.0000 mg | ORAL_TABLET | Freq: Every day | ORAL | Status: DC

## 2015-10-08 MED ORDER — HYDROCODONE-HOMATROPINE 5-1.5 MG/5ML PO SYRP: 5.0000 mL | ORAL_SOLUTION | Freq: Four times a day (QID) | ORAL | Status: DC | PRN

## 2015-10-08 NOTE — Patient Instructions (Addendum)
Continue fluticasone nasal spray, 2 sprays is not twice a day Continue ranitidine 10 mg daily Continue Singulair 10 mg each evening Increase omeprazole to 40 mg twice a day for the next week. Go back to 40 mg daily after a week. Take prednisone 40 mg daily for 5 days then stop Start doing nasal saline washes every day Take Hydromet 5 mL up to every 6 hours if needed for cough Follow with Dr Lamonte Sakai next available to assess your improvement

## 2015-10-08 NOTE — Progress Notes (Signed)
Subjective:    Patient ID: Christy May, female    DOB: Oct 27, 1950, 65 y.o.   MRN: [id number]  HPI 65 yo woman, former smoker with obesity, OSA, HTN on irbesartan, DM, childhood asthma. Carries a dx of adult asthma made several years ago. Referred for cough. She describes cough that has been prod of white phlegm, started after URI sx about 4 months ago. Describes a tickle in her throat. Can result in stress incontinence.   She has been on Advair in the past. Was recently changed to QVAR + albuterol, uses about 2-3 x a month. She is on omeprazole 20mg  twice a day. She still has occasional reflux sx > can reflux all the way up to throat. She has allergy and rhinitis sx frequently - singulair has been added recently and has helped her.   CXR 12/22/13 - normal.  We will perform full pulmonary function testing  We will change your omeprazole to nexium 40mg  twice a day until our next visit Continue your singulair Stop QVAR for now Keep albuterol available to use 2 puffs if needed for shortness of breath Start loratadine (Claritin) 10mg  daily Start fluticasone nasal spray, 2 sprays each nostril daily Follow with Dr Lamonte Sakai in 1 month  Acute OV 02/01/14 --  Returns for persistent cough. Seen 1 month ago. Seen last month for pulmonary consult for cough.  She is quite upset that her cough is no better and she can not go anywhere due to stress incontinence w/ coughing fits. Has been going on for ~5 months  On Lopressor 75mg  Twice daily   CXR w/ no acute process 12/22/13 .  Former smoker, PFT pending.  Last ov was changed from prilosec to nexium for better GERD control. She continues to have intermittent reflux.  QVAR was held due to no improvement in symptoms. Previous on Advair in past without help.  No fever, orthopnea, edema , n/v/d, discolored mucus. Does have some sinus drainage.  Has used tussionex with some help, but is out of this.   ROV 02/14/14 -- follows for cough, GERD, PND, suspected  asthma. I changed omeprazole to nexium, added loratdine, flonase. TP added pepcid, chlorpheniramine. Her cough has gotten better but has not gone away completely. She is still having. She has been dx w sleep apnea - she has not been able to tolerate CPAP.   PFT done today 02/14/14 > shows some spiro evidence for restriction but for the most part normal. Could not do volumes.    ROV 03/13/14 -- follow up for chronic cough, influenced by GERD and rhinitis. Her PFT are reassuring > little evidence for asthma. Treated recently by Dr Jenny Reichmann for a possible bronchitis and flare. Temporarily improved but then cough right back to where she started.  She is on high dose nexium, flonase, singulair, pepcid qhs.  She is on loratadine qd, not on chlorpheniramine. She is having some nasal congestion. She still has some mild GERD sx.   ROV 08/09/14 -- follow-up visit for chronic cough. She has a history of GERD and allergic rhinitis. We have not been able to establish a diagnosis of asthma based on her spirometry.She had been feeling well, fairly good cough control. Has been going to the speech therapists at [facility] with benefit. She is on singulair, pepcid, Nexium, neurontin, fluticasone, loratadine. She asks today about OSA and CPAP.   ROV 10/30/14 -- follow-up visit for chronic cough, upper airway disease, GERD allergic rhinitis. She has benefited from speech therapy, loratadine,  Singulair, Pepcid, omeprazole,  Neurontin. She has been dx with OSA and we started her on auto-set CPAP since her last visit.  She tells me today that she doesn't have the CPAP yet.  She tells me that she has been having more trouble with her cough since last several weeks. She is coughing up some beige mucous.    ROV 03/05/15 -- chronic cough, upper airway disease, GERD allergic rhinitis. She follows today after her device download > reviewed this today, shows that she has worn it some, 11 / 30 days (37% of the days). She is having trouble  tolerating the mask, but does feel that it helps her, makes her feel better the next day. She just had an exacerbation of her cough in the setting of a URI. Her voice is scratchy for the last several days. Her cough is stable since she completed a pred taper.   Acute OV 07/05/15 -- acute sick visit for history of upper airway disease, chronic cough. She has GERD and allergic rhinitis. Her pulmonary function testing from 02/14/14 is most consistent with restriction. Also with a history of obstructive sleep apnea. She was seen in December by Rexene Edison for apparent acute bronchitis that was treated with azithromycin and prednisone, Hydromet. She was treated again in late Dec with lasix, azithro and hydromet again. She presents today staring that these meds helped her, allowed her to sleep through the night. She is still having nasal gtt, chest tightness, cough. She is being awakened by UE pain, hand pain and neuropathy. She remains on nasal steroid, loratadine, singulair. She is on prilosec 40mg  daily. She is on neurontin.  She has albuterol nebs, uses occasionally, does help some.   She has not been using her CPAP mask for a few weeks, needs to discuss with [facility] to get repairs.   ROV 10/08/15 -- follows up today for obstructive sleep apnea, chronic cough with upper airway disease/VCD in the setting of allergic rhinitis and GERD. She has been seen by SG and TP since our last visit, treated for bronchitis since our last visit.  She has been compliant with her CPAP, wears it reliably. She may have noticed some benefit during day - unsure.   Her cough has been constant, has not had relief with any of our maneuvers in feb or march.  She was treated recently by Dr Criss Rosales for her cough as well. She is taking flonase nasal spray, omeprazole, loratadine, singulair. She may be having some breakthrough GERD sx. She is definitely having persistent allergies. She does Guffey occasionally.    Review of Systems As per  HPI     Objective:   Physical Exam Filed Vitals:   10/08/15 1634  BP: 124/74  Pulse: 90  Height: 5' 4.75" (1.645 m)  Weight: 305 lb (138.347 kg)  SpO2: 94%    GEN: A/Ox3; depressed affect, obese woman in no distress  HEENT:  OP clear, hoarse voice. No postnasal drip evident  NECK:  No stridor  RESP  Clear bilaterally, no wheeze on forced expiration  CARD:  RRR, no m/r/g, trace edema.   Musco: Warm bil, no deformities or joint swelling noted.   Neuro: alert, no focal deficits noted.    Skin: Warm, no lesions or rashes      Assessment & Plan:  Obstructive sleep apnea Tolerating and benefiting from CPAP. She has good compliance and wears it most nights. We will continue the same  Cough Cough due to chronic upper airway irritation  syndrome. She has influences from chronic rhinitis, chronic GERD, chronic throat clearing and the recurrent injury the comes from her cough. We will try to break the cycle of her cough with short course of prednisone. We need to ramp up her allergy and GERD therapy, have her avoid throat clearing, try to push voice rest. She requested prescription cough syrup and this was prescribed.  Continue fluticasone nasal spray, 2 sprays is not twice a day Continue ranitidine 10 mg daily Continue Singulair 10 mg each evening Increase omeprazole to 40 mg twice a day for the next week. Go back to 40 mg daily after a week. Take prednisone 40 mg daily for 5 days then stop Start doing nasal saline washes every day Take Hydromet 5 mL up to every 6 hours if needed for cough Follow with Dr Lamonte Sakai next available to assess your improvement

## 2015-10-08 NOTE — Assessment & Plan Note (Signed)
Tolerating and benefiting from CPAP. She has good compliance and wears it most nights. We will continue the same

## 2015-10-08 NOTE — Assessment & Plan Note (Addendum)
Cough due to chronic upper airway irritation syndrome. She has influences from chronic rhinitis, chronic GERD, chronic throat clearing and the recurrent injury the comes from her cough. We will try to break the cycle of her cough with short course of prednisone. We need to ramp up her allergy and GERD therapy, have her avoid throat clearing, try to push voice rest. She requested prescription cough syrup and this was prescribed.  Continue fluticasone nasal spray, 2 sprays is not twice a day Continue ranitidine 10 mg daily Continue Singulair 10 mg each evening Increase omeprazole to 40 mg twice a day for the next week. Go back to 40 mg daily after a week. Take prednisone 40 mg daily for 5 days then stop Start doing nasal saline washes every day Take Hydromet 5 mL up to every 6 hours if needed for cough Follow with Dr Lamonte Sakai next available to assess your improvement

## 2015-10-09 DIAGNOSIS — E113313 Type 2 diabetes mellitus with moderate nonproliferative diabetic retinopathy with macular edema, bilateral: Secondary | ICD-10-CM | POA: Diagnosis not present

## 2015-10-09 DIAGNOSIS — H25813 Combined forms of age-related cataract, bilateral: Secondary | ICD-10-CM | POA: Diagnosis not present

## 2015-10-15 DIAGNOSIS — H2513 Age-related nuclear cataract, bilateral: Secondary | ICD-10-CM | POA: Diagnosis not present

## 2015-10-15 DIAGNOSIS — E113293 Type 2 diabetes mellitus with mild nonproliferative diabetic retinopathy without macular edema, bilateral: Secondary | ICD-10-CM | POA: Diagnosis not present

## 2015-10-15 DIAGNOSIS — E113213 Type 2 diabetes mellitus with mild nonproliferative diabetic retinopathy with macular edema, bilateral: Secondary | ICD-10-CM | POA: Diagnosis not present

## 2015-10-16 ENCOUNTER — Ambulatory Visit (INDEPENDENT_AMBULATORY_CARE_PROVIDER_SITE_OTHER): Payer: PPO | Admitting: Endocrinology

## 2015-10-16 ENCOUNTER — Encounter: Payer: Self-pay | Admitting: Endocrinology

## 2015-10-16 VITALS — BP 132/72 | HR 78 | Temp 97.6°F | Resp 12 | Ht 64.0 in | Wt 307.6 lb

## 2015-10-16 DIAGNOSIS — E1122 Type 2 diabetes mellitus with diabetic chronic kidney disease: Secondary | ICD-10-CM | POA: Diagnosis not present

## 2015-10-16 DIAGNOSIS — Z794 Long term (current) use of insulin: Secondary | ICD-10-CM

## 2015-10-16 DIAGNOSIS — N183 Chronic kidney disease, stage 3 (moderate): Secondary | ICD-10-CM

## 2015-10-16 MED ORDER — INSULIN NPH (HUMAN) (ISOPHANE) 100 UNIT/ML ~~LOC~~ SUSP: 170.0000 [IU] | SUBCUTANEOUS | Status: DC

## 2015-10-16 NOTE — Patient Instructions (Addendum)
We'll call you when we can do the blood sugar test from your fingertip.   Please continue the same insulin for now.  On this type of insulin schedule, you should eat meals on a regular schedule.  If a meal is missed or significantly delayed, your blood sugar could go low.   check your blood sugar twice a day.  vary the time of day when you check, between before the 3 meals, and at bedtime.  also check if you have symptoms of your blood sugar being too high or too low.  please keep a record of the readings and bring it to your next appointment here.  You can write it on any piece of paper.  please call us sooner if your blood sugar goes below 70, or if you have a lot of readings over 200. Please come back for a follow-up appointment in 3 months.

## 2015-10-16 NOTE — Progress Notes (Signed)
Subjective:    Patient ID: Christy May, female    DOB: 10/27/1950, 65 y.o.   MRN: [id number]  HPI Pt returns for f/u of diabetes mellitus: DM type: Insulin-requiring type 2 Dx'ed: 9826 Complications: polyneuropathy and renal insufficiency Therapy: insulin since 2002.   GDM: never DKA: never Severe hypoglycemia: never.  Pancreatitis: never.  Other: she was changed to a qd insulin regimen, after poor results on multiple daily injections; she wants the cheapest possible insulin.  She declines weight loss surgery.   Interval history: she brings a record of her cbg's which i have reviewed today.  It varies from 62 (afternoon, after she missed lunch), up to the 200's.  Other than a missed meal, There is no trend throughout the day.  pt states she feels well in general.  No recent steroids.  Past Medical History  Diagnosis Date  . Anemia, unspecified   . Dehydration   . Depressive disorder, not elsewhere classified   . Esophageal reflux   . Unspecified essential hypertension   . Unspecified menopausal and postmenopausal disorder   . Obesity, unspecified   . Inflammatory and toxic neuropathy, unspecified   . Unspecified vitamin D deficiency   . Dysphagia 2007    historyof dysphagia with severe dysmotility by barium swallow-[name]  . Obesity   . Hypertension   . Allergic bronchitis   . Complication of anesthesia     "I've had recall; I probably stopped breathing at least 2 times" (09/03/2014)  . Family history of adverse reaction to anesthesia     "my brother was told they lost him a couple times during OR"  . OSA (obstructive sleep apnea)     "stopped using my CPAP; just couldn't use it cause it kept me awake and I couldn't keep it on my face'"(09/03/2014)  . Heart murmur   . Chronic airway obstruction, not elsewhere classified     "I was told I don't have this/tests done @ [facility] 05/2014"  . Extrinsic asthma, unspecified     "I was told I don't have this/tests done @  [facility] 05/2014"  . Type II diabetes mellitus ([place])   . History of hiatal hernia   . Headache     "weekly to monthly" (09/03/2014)  . Migraine     "couple times/month right now" (09/03/2014)  . Spondylosis of unspecified site without mention of myelopathy   . Arthritis     "neck, back, hands" (09/03/2014)  . Diabetic peripheral neuropathy ([place])   . Chronic back pain   . Allergy   . Anxiety     Past Surgical History  Procedure Laterality Date  . Refractive surgery Bilateral   . Anterior cervical decomp/discectomy fusion      multiple cervical spine levels;Dr. Arnoldo Morale  . Incision and drainage abscess      Chest  . Dilation and curettage of uterus    . Exploratory laparotomy    . Total knee arthroplasty Left 10/2003  . Total knee arthroplasty Right 01/2004    [name]  . Foot surgery Right X 2    "took blood out of my arms and put platelets in my feet"  . Appendectomy    . Tonsillectomy    . Esophagogastroduodenoscopy N/A 05/08/2014    Procedure: ESOPHAGOGASTRODUODENOSCOPY (EGD);  Surgeon: [name], MD;  Location: [name];  Service: Endoscopy;  Laterality: N/A;  . Colonoscopy N/A 05/08/2014    Procedure: COLONOSCOPY;  Surgeon: [name], MD;  Location: [facility];  Service: Endoscopy;  Laterality: N/A;  . Back surgery    . Cholecystectomy open    . Abdominal hysterectomy      "partial"  . Eye surgery    . Joint replacement      Social History   Social History  . Marital Status: Widowed    Spouse Name: N/A  . Number of Children: N/A  . Years of Education: N/A   Occupational History  . disabled    Social History Main Topics  . Smoking status: Former Smoker -- 0.00 packs/day for 0 years    Types: Cigarettes    Quit date: 06/22/1986  . Smokeless tobacco: Never Used  . Alcohol Use: Yes  . Drug Use: No  . Sexual Activity: Not on file   Other Topics Concern  . Not on file   Social History Narrative   ** Merged History Encounter **       Patient  does not get regular exercise.      Widowed 2004      Disabled    Current Outpatient Prescriptions on File Prior to Visit  Medication Sig Dispense Refill  . albuterol (PROVENTIL) (2.5 MG/3ML) 0.083% nebulizer solution Take 2.5 mg by nebulization every 6 (six) hours as needed for wheezing or shortness of breath.    . allopurinol (ZYLOPRIM) 300 MG tablet Take 1 tablet (300 mg total) by mouth daily. 30 tablet 6  . aspirin 81 MG tablet Take 81 mg by mouth at bedtime.     . Blood Glucose Monitoring Suppl (ONE TOUCH ULTRA 2) w/Device KIT Use to check blood sugar 2 times per day. Dx Code: E11.9 1 each 2  . cholecalciferol (VITAMIN D) 1000 UNITS tablet Take 2,000 Units by mouth every morning.     . famotidine (PEPCID) 20 MG tablet TAKE 1 TABLET BY MOUTH EVERY NIGHT AT BEDTIME 30 tablet 5  . fluticasone (FLONASE) 50 MCG/ACT nasal spray Place 2 sprays into both nostrils every morning. 16 g 6  . folic acid (FOLVITE) 1 MG tablet Take 4 tablets (4 mg total) by mouth daily. 120 tablet 11  . gabapentin (NEURONTIN) 800 MG tablet Take 1 tablet (800 mg total) by mouth 3 (three) times daily. 270 tablet 1  . glucosamine-chondroitin 500-400 MG tablet Take 1 tablet by mouth 3 (three) times daily.    Marland Kitchen glucose blood (ONE TOUCH ULTRA TEST) test strip USE TO TEST BLOOD SUGAR TWICE DAILY. DX: E10.41 100 each 5  . HYDROcodone-homatropine (HYDROMET) 5-1.5 MG/5ML syrup Take 5 mLs by mouth every 6 (six) hours as needed. 120 mL 0  . irbesartan (AVAPRO) 300 MG tablet TAKE 1 TABLET(300 MG) BY MOUTH DAILY 90 tablet 0  . Lancets (ONETOUCH ULTRASOFT) lancets Use to test blood sugars twice a day. 100 each 12  . loratadine (CLARITIN) 10 MG tablet TAKE 1 TABLET BY MOUTH EVERY DAY 30 tablet 5  . metoprolol (LOPRESSOR) 50 MG tablet Take 1.5 tablets (75 mg total) by mouth 2 (two) times daily. 270 tablet 3  . montelukast (SINGULAIR) 10 MG tablet TAKE 1 TABLET BY MOUTH DAILY 90 tablet 0  . nystatin cream (MYCOSTATIN) APPLY TO  AFFECTED AREA TWICE DAILY 30 g 0  . Omega-3 Fatty Acids (FISH OIL) 1200 MG CPDR Take 1,200 mg by mouth daily.    Marland Kitchen omeprazole (PRILOSEC) 40 MG capsule TAKE 1 CAPSULE(40 MG) BY MOUTH DAILY 30 capsule 5  . oxybutynin (DITROPAN-XL) 10 MG 24 hr tablet Take 1 tablet (10 mg total) by mouth daily.  90 tablet 1   No current facility-administered medications on file prior to visit.    Allergies  Allergen Reactions  . Ace Inhibitors Anaphylaxis  . Penicillins Hives and Rash  . Adhesive [Tape] Rash  . Tape Hives    Paper tape is ok to use    Family History  Problem Relation Age of Onset  . Esophageal cancer Brother   . Esophageal cancer Sister     ?  . Colon polyps Brother   . Pancreatic cancer Sister     ?  . Diabetes Sister   . Diabetes      Aunt and Uncle  . Heart disease Maternal Grandfather     BP 132/72 mmHg  Pulse 78  Temp(Src) 97.6 F (36.4 C) (Oral)  Resp 12  Ht '5\' 4"'  (1.626 m)  Wt 307 lb 9.6 oz (139.526 kg)  BMI 52.77 kg/m2  SpO2 94%   Review of Systems Denies LOC.    Objective:   Physical Exam VITAL SIGNS:  See vs page GENERAL: no distress Pulses: dorsalis pedis intact bilat.   MSK: no deformity of the feet CV: 1+ bilat leg edema Skin:  no ulcer on the feet.  normal color and temp on the feet. Neuro: sensation is intact to touch on the feet.     Assessment & Plan:  DM: uncertain control.  She declines venous a1c today.  We'll call her when we have finger capillary a1c available.    Patient is advised the following: Patient Instructions  We'll call you when we can do the blood sugar test from your fingertip.   Please continue the same insulin for now.  On this type of insulin schedule, you should eat meals on a regular schedule.  If a meal is missed or significantly delayed, your blood sugar could go low.   check your blood sugar twice a day.  vary the time of day when you check, between before the 3 meals, and at bedtime.  also check if you have symptoms  of your blood sugar being too high or too low.  please keep a record of the readings and bring it to your next appointment here.  You can write it on any piece of paper.  please call us sooner if your blood sugar goes below 70, or if you have a lot of readings over 200. Please come back for a follow-up appointment in 3 months.

## 2015-10-17 ENCOUNTER — Ambulatory Visit
Admission: RE | Admit: 2015-10-17 | Discharge: 2015-10-17 | Disposition: A | Discharge status: Home / Self Care | Payer: PPO | Source: Ambulatory Visit

## 2015-10-17 ENCOUNTER — Telehealth: Payer: Self-pay | Admitting: Endocrinology

## 2015-10-17 DIAGNOSIS — Z1231 Encounter for screening mammogram for malignant neoplasm of breast: Secondary | ICD-10-CM | POA: Diagnosis not present

## 2015-10-17 NOTE — Telephone Encounter (Signed)
please call patient: Please come in for a1c

## 2015-10-18 ENCOUNTER — Telehealth: Payer: Self-pay | Admitting: Endocrinology

## 2015-10-18 ENCOUNTER — Other Ambulatory Visit (INDEPENDENT_AMBULATORY_CARE_PROVIDER_SITE_OTHER): Payer: PPO

## 2015-10-18 ENCOUNTER — Other Ambulatory Visit: Payer: Self-pay

## 2015-10-18 DIAGNOSIS — E1122 Type 2 diabetes mellitus with diabetic chronic kidney disease: Secondary | ICD-10-CM

## 2015-10-18 DIAGNOSIS — Z794 Long term (current) use of insulin: Secondary | ICD-10-CM | POA: Diagnosis not present

## 2015-10-18 DIAGNOSIS — N183 Chronic kidney disease, stage 3 (moderate): Secondary | ICD-10-CM | POA: Diagnosis not present

## 2015-10-18 LAB — POCT GLYCOSYLATED HEMOGLOBIN (HGB A1C): HEMOGLOBIN A1C: 8.2

## 2015-10-18 MED ORDER — INSULIN NPH (HUMAN) (ISOPHANE) 100 UNIT/ML ~~LOC~~ SUSP: 180.0000 [IU] | Freq: Every day | SUBCUTANEOUS | Status: DC

## 2015-10-18 NOTE — Telephone Encounter (Signed)
Noted. 3 pm for the POCT A1C is ok.

## 2015-10-18 NOTE — Telephone Encounter (Signed)
Pt scheduled to come in today at 345 pm.

## 2015-10-18 NOTE — Telephone Encounter (Signed)
Patient stated she would like to come in at 3:00 to get her finger pricked for the A1C because she has somewhere to go at 3:30.

## 2015-10-22 ENCOUNTER — Telehealth: Payer: Self-pay | Admitting: Endocrinology

## 2015-10-22 DIAGNOSIS — M654 Radial styloid tenosynovitis [de Quervain]: Secondary | ICD-10-CM | POA: Diagnosis not present

## 2015-10-22 DIAGNOSIS — G5601 Carpal tunnel syndrome, right upper limb: Secondary | ICD-10-CM | POA: Diagnosis not present

## 2015-10-22 DIAGNOSIS — G5602 Carpal tunnel syndrome, left upper limb: Secondary | ICD-10-CM | POA: Diagnosis not present

## 2015-10-22 NOTE — Telephone Encounter (Signed)
Please reduce insulin to 150 units qam i'll see you next time.

## 2015-10-22 NOTE — Telephone Encounter (Signed)
I contacted the pt and left a vm advising of the results below. Requested a call back to verify the pt got the message.

## 2015-10-22 NOTE — Telephone Encounter (Signed)
See note below and please advise, Thanks! 

## 2015-10-22 NOTE — Telephone Encounter (Signed)
Pt had cortisone shot the sugar has been down to 45. Please advise

## 2015-10-22 NOTE — Telephone Encounter (Signed)
Pt notified of note below and voiced understanding.

## 2015-10-24 ENCOUNTER — Other Ambulatory Visit: Payer: Self-pay | Admitting: Emergency Medicine

## 2015-10-28 ENCOUNTER — Telehealth: Payer: Self-pay | Admitting: Emergency Medicine

## 2015-10-28 ENCOUNTER — Telehealth: Payer: Self-pay | Admitting: Endocrinology

## 2015-10-28 NOTE — Telephone Encounter (Signed)
Spoke with pt. She has been scheduled to see TP tomorrow at 12pm. Nothing further was needed.

## 2015-10-28 NOTE — Telephone Encounter (Signed)
Spoke with pt. Reports deep cough for 2 days. Cough is producing clear mucus. Denies chest tightness, wheezing, SOB or fever. Has not tried any OTC medications to help. She is coughing so hard that she wetting herself and times loosing control of her bowels.  JN - please advise as RB is on night float. Thanks.

## 2015-10-28 NOTE — Telephone Encounter (Signed)
Looking at RB's last note cough was previously having difficulty with incontinence with her cough. Can we get her in to see whomever's available tomorrow? Thanks.

## 2015-10-28 NOTE — Telephone Encounter (Signed)
[facility] is calling with questions about the test strips for PT CB#  [phone]

## 2015-10-28 NOTE — Telephone Encounter (Signed)
Pt called back to inquire about her message. Made her aware that we are still waiting on JN's response. She would like a call back on her cell phone number.

## 2015-10-28 NOTE — Telephone Encounter (Signed)
Duplicate message. 

## 2015-10-29 ENCOUNTER — Encounter: Payer: Self-pay | Admitting: Adult Health

## 2015-10-29 ENCOUNTER — Ambulatory Visit (INDEPENDENT_AMBULATORY_CARE_PROVIDER_SITE_OTHER): Payer: PPO | Admitting: Adult Health

## 2015-10-29 ENCOUNTER — Other Ambulatory Visit: Payer: Self-pay | Admitting: Endocrinology

## 2015-10-29 VITALS — BP 118/62 | HR 71 | Temp 98.2°F | Ht 64.0 in | Wt 304.0 lb

## 2015-10-29 DIAGNOSIS — J209 Acute bronchitis, unspecified: Secondary | ICD-10-CM | POA: Insufficient documentation

## 2015-10-29 DIAGNOSIS — J399 Disease of upper respiratory tract, unspecified: Secondary | ICD-10-CM | POA: Diagnosis not present

## 2015-10-29 DIAGNOSIS — E1342 Other specified diabetes mellitus with diabetic polyneuropathy: Secondary | ICD-10-CM | POA: Diagnosis not present

## 2015-10-29 DIAGNOSIS — I10 Essential (primary) hypertension: Secondary | ICD-10-CM | POA: Diagnosis not present

## 2015-10-29 DIAGNOSIS — G4730 Sleep apnea, unspecified: Secondary | ICD-10-CM | POA: Diagnosis not present

## 2015-10-29 MED ORDER — AZITHROMYCIN 250 MG PO TABS: ORAL_TABLET | ORAL | Status: AC

## 2015-10-29 MED ORDER — HYDROCODONE-HOMATROPINE 5-1.5 MG/5ML PO SYRP: 5.0000 mL | ORAL_SOLUTION | Freq: Four times a day (QID) | ORAL | Status: DC | PRN

## 2015-10-29 NOTE — Progress Notes (Signed)
Subjective:    Patient ID: Christy May, female    DOB: 1951-04-17, 65 y.o.   MRN: [id number]  HPI 65 yo female former smoker with OSA on CPAP , VCD  /Chronic cough    10/29/2015 Acute OV:  Pt presents for an acute office visit.  Complains of /o prod cough with thick clear, PND, chest congestion/tightness, wheezing, SOB with exertion x 3 days. Denies any fever, nausea or vomiting. No calf pain , hemoptysis or chest pain.  She has been treated for Chronic cough with VCD .  Remains Singulair, Flonase Claritin , and Saline nasal .  Ran out of hydromet cough syrup.  On Prilosec and Zantac  .  Seen 3 weeks ago with cough flare . Tx w/ steroids and GERD/AR prevention regimen .  She got better. Until 3 days ago. Cough started back .  CXR 07/2015 with nad.   Has prayer conference going on phone during office visit.   Brother and cousin just passed. , was away at funerals . Worried she picked up a germ at funerals.     Past Medical History  Diagnosis Date  . Anemia, unspecified   . Dehydration   . Depressive disorder, not elsewhere classified   . Esophageal reflux   . Unspecified essential hypertension   . Unspecified menopausal and postmenopausal disorder   . Obesity, unspecified   . Inflammatory and toxic neuropathy, unspecified   . Unspecified vitamin D deficiency   . Dysphagia 2007    historyof dysphagia with severe dysmotility by barium swallow-[name]  . Obesity   . Hypertension   . Allergic bronchitis   . Complication of anesthesia     "I've had recall; I probably stopped breathing at least 2 times" (09/03/2014)  . Family history of adverse reaction to anesthesia     "my brother was told they lost him a couple times during OR"  . OSA (obstructive sleep apnea)     "stopped using my CPAP; just couldn't use it cause it kept me awake and I couldn't keep it on my face'"(09/03/2014)  . Heart murmur   . Chronic airway obstruction, not elsewhere classified     "I was told  I don't have this/tests done @ [facility] 05/2014"  . Extrinsic asthma, unspecified     "I was told I don't have this/tests done @ [facility] 05/2014"  . Type II diabetes mellitus ([place])   . History of hiatal hernia   . Headache     "weekly to monthly" (09/03/2014)  . Migraine     "couple times/month right now" (09/03/2014)  . Spondylosis of unspecified site without mention of myelopathy   . Arthritis     "neck, back, hands" (09/03/2014)  . Diabetic peripheral neuropathy ([place])   . Chronic back pain   . Allergy   . Anxiety    Current Outpatient Prescriptions on File Prior to Visit  Medication Sig Dispense Refill  . albuterol (PROVENTIL) (2.5 MG/3ML) 0.083% nebulizer solution Take 2.5 mg by nebulization every 6 (six) hours as needed for wheezing or shortness of breath.    . allopurinol (ZYLOPRIM) 300 MG tablet Take 1 tablet (300 mg total) by mouth daily. 30 tablet 6  . aspirin 81 MG tablet Take 81 mg by mouth at bedtime.     . Blood Glucose Monitoring Suppl (ONE TOUCH ULTRA 2) w/Device KIT Use to check blood sugar 2 times per day. Dx Code: E11.9 1 each 2  . cholecalciferol (VITAMIN D) 1000 UNITS  tablet Take 2,000 Units by mouth every morning.     . empagliflozin (JARDIANCE) 10 MG TABS tablet Take 10 mg by mouth daily.    . famotidine (PEPCID) 20 MG tablet TAKE 1 TABLET BY MOUTH EVERY NIGHT AT BEDTIME 30 tablet 5  . fluticasone (FLONASE) 50 MCG/ACT nasal spray Place 2 sprays into both nostrils every morning. 16 g 6  . folic acid (FOLVITE) 1 MG tablet Take 4 tablets (4 mg total) by mouth daily. 120 tablet 11  . gabapentin (NEURONTIN) 800 MG tablet Take 1 tablet (800 mg total) by mouth 3 (three) times daily. 270 tablet 1  . glucosamine-chondroitin 500-400 MG tablet Take 1 tablet by mouth 3 (three) times daily.    . insulin NPH Human (NOVOLIN N) 100 UNIT/ML injection Inject 1.8 mLs (180 Units total) into the skin daily before breakfast. (Patient taking differently: Inject 150 Units into the skin daily  before breakfast. ) 60 mL 2  . irbesartan (AVAPRO) 300 MG tablet TAKE 1 TABLET(300 MG) BY MOUTH DAILY 90 tablet 0  . Lancets (ONETOUCH ULTRASOFT) lancets Use to test blood sugars twice a day. 100 each 12  . loratadine (CLARITIN) 10 MG tablet TAKE 1 TABLET BY MOUTH EVERY DAY 30 tablet 5  . metoprolol (LOPRESSOR) 50 MG tablet Take 1.5 tablets (75 mg total) by mouth 2 (two) times daily. 270 tablet 3  . montelukast (SINGULAIR) 10 MG tablet TAKE 1 TABLET BY MOUTH DAILY 90 tablet 0  . nystatin cream (MYCOSTATIN) APPLY TO AFFECTED AREA TWICE DAILY 30 g 0  . Omega-3 Fatty Acids (FISH OIL) 1200 MG CPDR Take 1,200 mg by mouth daily.    Marland Kitchen omeprazole (PRILOSEC) 40 MG capsule TAKE 1 CAPSULE(40 MG) BY MOUTH DAILY 30 capsule 5  . oxybutynin (DITROPAN-XL) 10 MG 24 hr tablet Take 1 tablet (10 mg total) by mouth daily. 90 tablet 1  . glucose blood (ONE TOUCH ULTRA TEST) test strip USE TO TEST BLOOD SUGAR TWICE DAILY. DX: E10.41 (Patient not taking: Reported on 10/29/2015) 100 each 5  . HYDROcodone-homatropine (HYDROMET) 5-1.5 MG/5ML syrup Take 5 mLs by mouth every 6 (six) hours as needed. (Patient not taking: Reported on 10/29/2015) 120 mL 0   No current facility-administered medications on file prior to visit.      Review of Systems Constitutional:   No  weight loss, night sweats,  Fevers, chills, + fatigue, or  lassitude.  HEENT:   No headaches,  Difficulty swallowing,  Tooth/dental problems, or  Sore throat,                No sneezing, itching, ear ache,  +nasal congestion, post nasal drip,   CV:  No chest pain,  Orthopnea, PND, swelling in lower extremities, anasarca, dizziness, palpitations, syncope.   GI  No heartburn, indigestion, abdominal pain, nausea, vomiting, diarrhea, change in bowel habits, loss of appetite, bloody stools.   Resp:  .  No chest wall deformity  Skin: no rash or lesions.  GU: no dysuria, change in color of urine, no urgency or frequency.  No flank pain, no hematuria   MS:   No joint pain or swelling.  No decreased range of motion.  No back pain.  Psych:  No change in mood or affect. No depression or anxiety.  No memory loss.         Objective:   Physical Exam Filed Vitals:   10/29/15 1231  BP: 118/62  Pulse: 71  Temp: 98.2 F (36.8 C)  TempSrc: Oral  Height: _0  (1.626 m)  Weight: 304 lb (137.893 kg)  SpO2: 99%    Body mass index is 52.16 kg/(m^2).   GEN: A/Ox3; pleasant , NAD, morbidly obese   HEENT:  [place]/AT,  EACs-clear, TMs-wnl, NOSE-clear, THROAT-clear, no lesions, no postnasal drip or exudate noted.   NECK:  Supple w/ fair ROM; no JVD; normal carotid impulses w/o bruits; no thyromegaly or nodules palpated; no lymphadenopathy.  RESP  Decreased BS in bases , no accessory muscle use, no dullness to percussion  CARD:  RRR, no m/r/g  , no peripheral edema, pulses intact, no cyanosis or clubbing.  GI:   Soft & nt; nml bowel sounds; no organomegaly or masses detected.  Musco: Warm bil, no deformities or joint swelling noted.   Neuro: alert, no focal deficits noted.    Skin: Warm, no lesions or rashes  Darneisha Windhorst NP-C  [facility]  10/29/2015       Assessment & Plan:

## 2015-10-29 NOTE — Assessment & Plan Note (Signed)
Recurrent flare , cont with trigger control and cough control  Pt education on narcotic use.  If not improving or recurrent consider CT sinus   Plan  Zpack take as directed.  Continue fluticasone nasal spray, 2 sprays is not twice a day Continue ranitidine 10 mg daily Continue Singulair 10 mg each evening Continue on Omeprazole to 40 mg  daily  Continue on nasal saline washes  Take Hydromet 5 mL up to every 6 hours if needed for cough follow up . Please contact office for sooner follow up if symptoms do not improve or worsen or seek emergency care  Follow up Dr. Lamonte Sakai  In 3 months and As needed

## 2015-10-29 NOTE — Patient Instructions (Addendum)
Zpack take as directed.  Continue fluticasone nasal spray, 2 sprays is not twice a day Continue ranitidine 10 mg daily Continue Singulair 10 mg each evening Continue on Omeprazole to 40 mg  daily  Continue on nasal saline washes  Take Hydromet 5 mL up to every 6 hours if needed for cough follow up . Please contact office for sooner follow up if symptoms do not improve or worsen or seek emergency care  Follow up Dr. Lamonte Sakai  In 3 months and As needed

## 2015-10-29 NOTE — Telephone Encounter (Signed)
I contacted [facility] and they stated no information was needed for the test strips. Pt would pick the rx up today.

## 2015-11-05 ENCOUNTER — Other Ambulatory Visit: Payer: Self-pay | Admitting: Internal Medicine

## 2015-11-05 ENCOUNTER — Other Ambulatory Visit: Payer: Self-pay | Admitting: Family Medicine

## 2015-11-05 NOTE — Telephone Encounter (Signed)
A user error has taken place.

## 2015-11-05 NOTE — Telephone Encounter (Signed)
Pre visit review using our clinic review tool, if applicable. No additional management support is needed unless otherwise documented below in the visit note. 

## 2015-11-07 ENCOUNTER — Telehealth: Payer: Self-pay | Admitting: Adult Health

## 2015-11-07 ENCOUNTER — Ambulatory Visit (INDEPENDENT_AMBULATORY_CARE_PROVIDER_SITE_OTHER): Payer: PPO | Admitting: Adult Health

## 2015-11-07 ENCOUNTER — Ambulatory Visit (INDEPENDENT_AMBULATORY_CARE_PROVIDER_SITE_OTHER)
Admission: RE | Admit: 2015-11-07 | Discharge: 2015-11-07 | Disposition: A | Discharge status: Home / Self Care | Payer: PPO | Source: Ambulatory Visit | Attending: Adult Health | Admitting: Adult Health

## 2015-11-07 ENCOUNTER — Encounter: Payer: Self-pay | Admitting: Adult Health

## 2015-11-07 VITALS — BP 140/74 | HR 77 | Temp 98.2°F | Ht 64.0 in | Wt 311.0 lb

## 2015-11-07 DIAGNOSIS — R059 Cough, unspecified: Secondary | ICD-10-CM

## 2015-11-07 DIAGNOSIS — J208 Acute bronchitis due to other specified organisms: Secondary | ICD-10-CM

## 2015-11-07 DIAGNOSIS — R05 Cough: Secondary | ICD-10-CM | POA: Diagnosis not present

## 2015-11-07 MED ORDER — DOXYCYCLINE HYCLATE 100 MG PO TABS: 100.0000 mg | ORAL_TABLET | Freq: Two times a day (BID) | ORAL | Status: DC

## 2015-11-07 MED ORDER — PREDNISONE 10 MG PO TABS: ORAL_TABLET | ORAL | Status: DC

## 2015-11-07 NOTE — Telephone Encounter (Signed)
She needs an OV 

## 2015-11-07 NOTE — Telephone Encounter (Signed)
PT finished Zpak 4 days ago.  Still c/o prod cough (thick, pinkish), chest tightness, wheezing, headaches, some SOB,  Denies fever.  Still taking Hydromet prn along with Flonase and Singulair.  Please advise.

## 2015-11-07 NOTE — Progress Notes (Signed)
Subjective:    Patient ID: Christy May, female    DOB: 01/12/1951, 65 y.o.   MRN: [id number]  HPI 65 yo female former smoker with OSA on CPAP , VCD  /Chronic cough    11/07/2015 Acute OV:  Pt presents for an acute office visit.  She complains of c/o prod cough with pink mucus, sinus pressure/drainage, chest congestion/tightness, wheezing and SOB for last few days. . Denies any fever, nausea or vomiting. Pt completed zpak given at ov on 10/29/15. Got better but sx are back again. She denies orthopnea, chest pain, or calf pain./edema.  Remains Singulair, Flonase Claritin , and Saline nasal .  CXR today shows no acute process.     Past Medical History  Diagnosis Date  . Anemia, unspecified   . Dehydration   . Depressive disorder, not elsewhere classified   . Esophageal reflux   . Unspecified essential hypertension   . Unspecified menopausal and postmenopausal disorder   . Obesity, unspecified   . Inflammatory and toxic neuropathy, unspecified   . Unspecified vitamin D deficiency   . Dysphagia 2007    historyof dysphagia with severe dysmotility by barium swallow-[name]  . Obesity   . Hypertension   . Allergic bronchitis   . Complication of anesthesia     "I've had recall; I probably stopped breathing at least 2 times" (09/03/2014)  . Family history of adverse reaction to anesthesia     "my brother was told they lost him a couple times during OR"  . OSA (obstructive sleep apnea)     "stopped using my CPAP; just couldn't use it cause it kept me awake and I couldn't keep it on my face'"(09/03/2014)  . Heart murmur   . Chronic airway obstruction, not elsewhere classified     "I was told I don't have this/tests done @ [facility] 05/2014"  . Extrinsic asthma, unspecified     "I was told I don't have this/tests done @ [facility] 05/2014"  . Type II diabetes mellitus ([place])   . History of hiatal hernia   . Headache     "weekly to monthly" (09/03/2014)  . Migraine     "couple  times/month right now" (09/03/2014)  . Spondylosis of unspecified site without mention of myelopathy   . Arthritis     "neck, back, hands" (09/03/2014)  . Diabetic peripheral neuropathy ([place])   . Chronic back pain   . Allergy   . Anxiety    Current Outpatient Prescriptions on File Prior to Visit  Medication Sig Dispense Refill  . albuterol (PROVENTIL) (2.5 MG/3ML) 0.083% nebulizer solution Take 2.5 mg by nebulization every 6 (six) hours as needed for wheezing or shortness of breath.    . allopurinol (ZYLOPRIM) 300 MG tablet TAKE 1 TABLET(300 MG) BY MOUTH DAILY 30 tablet 6  . aspirin 81 MG tablet Take 81 mg by mouth at bedtime.     . Blood Glucose Monitoring Suppl (ONE TOUCH ULTRA 2) w/Device KIT Use to check blood sugar 2 times per day. Dx Code: E11.9 1 each 2  . cholecalciferol (VITAMIN D) 1000 UNITS tablet Take 2,000 Units by mouth every morning.     . empagliflozin (JARDIANCE) 10 MG TABS tablet Take 10 mg by mouth daily.    . famotidine (PEPCID) 20 MG tablet TAKE 1 TABLET BY MOUTH EVERY NIGHT AT BEDTIME 30 tablet 5  . fluticasone (FLONASE) 50 MCG/ACT nasal spray Place 2 sprays into both nostrils every morning. 16 g 6  . folic  acid (FOLVITE) 1 MG tablet Take 4 tablets (4 mg total) by mouth daily. 120 tablet 11  . gabapentin (NEURONTIN) 800 MG tablet Take 1 tablet (800 mg total) by mouth 3 (three) times daily. 270 tablet 1  . glucose blood (ONE TOUCH ULTRA TEST) test strip USE TO TEST BLOOD SUGAR TWICE DAILY. DX: E10.41 100 each 5  . HYDROcodone-homatropine (HYDROMET) 5-1.5 MG/5ML syrup Take 5 mLs by mouth every 6 (six) hours as needed. 120 mL 0  . insulin NPH Human (NOVOLIN N) 100 UNIT/ML injection Inject 1.8 mLs (180 Units total) into the skin daily before breakfast. (Patient taking differently: Inject 150 Units into the skin daily before breakfast. ) 60 mL 2  . irbesartan (AVAPRO) 300 MG tablet TAKE 1 TABLET(300 MG) BY MOUTH DAILY 90 tablet 0  . Lancets (ONETOUCH ULTRASOFT) lancets Use to  test blood sugars twice a day. 100 each 12  . loratadine (CLARITIN) 10 MG tablet TAKE 1 TABLET BY MOUTH EVERY DAY 30 tablet 5  . metoprolol (LOPRESSOR) 50 MG tablet Take 1.5 tablets (75 mg total) by mouth 2 (two) times daily. 270 tablet 3  . montelukast (SINGULAIR) 10 MG tablet TAKE 1 TABLET BY MOUTH DAILY 90 tablet 0  . nystatin cream (MYCOSTATIN) APPLY TO AFFECTED AREA TWICE DAILY 30 g 0  . Omega-3 Fatty Acids (FISH OIL) 1200 MG CPDR Take 1,200 mg by mouth daily.    Marland Kitchen omeprazole (PRILOSEC) 40 MG capsule TAKE 1 CAPSULE(40 MG) BY MOUTH DAILY 30 capsule 5  . oxybutynin (DITROPAN-XL) 10 MG 24 hr tablet Take 1 tablet (10 mg total) by mouth daily. 90 tablet 1  . glucosamine-chondroitin 500-400 MG tablet Take 1 tablet by mouth 3 (three) times daily.     No current facility-administered medications on file prior to visit.      Review of Systems Constitutional:   No  weight loss, night sweats,  Fevers, chills, + fatigue, or  lassitude.  HEENT:   No headaches,  Difficulty swallowing,  Tooth/dental problems, or  Sore throat,                No sneezing, itching, ear ache,  +nasal congestion, post nasal drip,   CV:  No chest pain,  Orthopnea, PND, swelling in lower extremities, anasarca, dizziness, palpitations, syncope.   GI  No heartburn, indigestion, abdominal pain, nausea, vomiting, diarrhea, change in bowel habits, loss of appetite, bloody stools.   Resp:  .  No chest wall deformity  Skin: no rash or lesions.  GU: no dysuria, change in color of urine, no urgency or frequency.  No flank pain, no hematuria   MS:  No joint pain or swelling.  No decreased range of motion.  No back pain.  Psych:  No change in mood or affect. No depression or anxiety.  No memory loss.         Objective:   Physical Exam Filed Vitals:   11/07/15 1550  BP: 140/74  Pulse: 77  Temp: 98.2 F (36.8 C)  TempSrc: Oral  Height: '5\' 4"'  (1.626 m)  Weight: 311 lb (141.069 kg)  SpO2: 96%    Body mass  index is 53.36 kg/(m^2).   GEN: A/Ox3; pleasant , NAD, morbidly obese   HEENT:  [place]/AT,  EACs-clear, TMs-wnl, NOSE-clear, THROAT-clear, no lesions, no postnasal drip or exudate noted.   NECK:  Supple w/ fair ROM; no JVD; normal carotid impulses w/o bruits; no thyromegaly or nodules palpated; no lymphadenopathy.  RESP  Decreased BS in bases ,  no accessory muscle use, no dullness to percussion  CARD:  RRR, no m/r/g  , no peripheral edema, pulses intact, no cyanosis or clubbing.  GI:   Soft & nt; nml bowel sounds; no organomegaly or masses detected.  Musco: Warm bil, no deformities or joint swelling noted.   Neuro: alert, no focal deficits noted.    Skin: Warm, no lesions or rashes  Tammy Parrett NP-C  [facility]  11/07/2015       Assessment & Plan:

## 2015-11-07 NOTE — Telephone Encounter (Signed)
Spoke with pt and given appt with Tammy Parrett today at 3:30

## 2015-11-07 NOTE — Patient Instructions (Addendum)
Doxycycline 100mg  Twice daily  For  7 days -use sunscreen.  Eat yogurt daily .  Prednisone taper over next week.  Continue fluticasone nasal spray, 2 sprays is not twice a day Continue Singulair 10 mg each evening Continue on Omeprazole to 40 mg  daily  Continue on nasal saline washes  Take Hydromet 5 mL up to every 6 hours if needed for cough follow up . Please contact office for sooner follow up if symptoms do not improve or worsen or seek emergency care  Follow up Dr. Lamonte Sakai  In 6 weeks  and As needed

## 2015-11-08 ENCOUNTER — Telehealth: Payer: Self-pay | Admitting: Adult Health

## 2015-11-08 NOTE — Telephone Encounter (Signed)
Attempted to contact pt. Line was busy. Will try back. 

## 2015-11-10 ENCOUNTER — Other Ambulatory Visit: Payer: Self-pay | Admitting: Internal Medicine

## 2015-11-11 ENCOUNTER — Encounter: Payer: Self-pay | Admitting: Acute Care

## 2015-11-11 ENCOUNTER — Ambulatory Visit (INDEPENDENT_AMBULATORY_CARE_PROVIDER_SITE_OTHER): Payer: PPO | Admitting: Acute Care

## 2015-11-11 VITALS — BP 118/68 | HR 78 | Ht 64.0 in | Wt 306.8 lb

## 2015-11-11 DIAGNOSIS — G4733 Obstructive sleep apnea (adult) (pediatric): Secondary | ICD-10-CM

## 2015-11-11 DIAGNOSIS — M25562 Pain in left knee: Secondary | ICD-10-CM | POA: Diagnosis not present

## 2015-11-11 DIAGNOSIS — M6281 Muscle weakness (generalized): Secondary | ICD-10-CM | POA: Diagnosis not present

## 2015-11-11 DIAGNOSIS — N390 Urinary tract infection, site not specified: Secondary | ICD-10-CM | POA: Diagnosis not present

## 2015-11-11 DIAGNOSIS — R269 Unspecified abnormalities of gait and mobility: Secondary | ICD-10-CM | POA: Diagnosis not present

## 2015-11-11 DIAGNOSIS — R059 Cough, unspecified: Secondary | ICD-10-CM

## 2015-11-11 DIAGNOSIS — R0609 Other forms of dyspnea: Secondary | ICD-10-CM

## 2015-11-11 DIAGNOSIS — R05 Cough: Secondary | ICD-10-CM

## 2015-11-11 DIAGNOSIS — G8929 Other chronic pain: Secondary | ICD-10-CM | POA: Diagnosis not present

## 2015-11-11 DIAGNOSIS — E1140 Type 2 diabetes mellitus with diabetic neuropathy, unspecified: Secondary | ICD-10-CM | POA: Diagnosis not present

## 2015-11-11 DIAGNOSIS — K219 Gastro-esophageal reflux disease without esophagitis: Secondary | ICD-10-CM | POA: Diagnosis not present

## 2015-11-11 DIAGNOSIS — R0602 Shortness of breath: Secondary | ICD-10-CM | POA: Diagnosis not present

## 2015-11-11 MED ORDER — HYDROCODONE-HOMATROPINE 5-1.5 MG/5ML PO SYRP: 5.0000 mL | ORAL_SOLUTION | Freq: Four times a day (QID) | ORAL | Status: DC | PRN

## 2015-11-11 MED ORDER — FLUTTER DEVI: Status: DC

## 2015-11-11 MED ORDER — MONTELUKAST SODIUM 10 MG PO TABS: 10.0000 mg | ORAL_TABLET | Freq: Every day | ORAL | Status: DC

## 2015-11-11 NOTE — Telephone Encounter (Signed)
Patient called - returning our call - she can be reached at [phone]

## 2015-11-11 NOTE — Assessment & Plan Note (Signed)
Slow to resolve flare w/ AR   Plan  Doxycycline 100mg  Twice daily  For  7 days -use sunscreen.  Eat yogurt daily .  Prednisone taper over next week.  Continue fluticasone nasal spray, 2 sprays is not twice a day Continue Singulair 10 mg each evening Continue on Omeprazole to 40 mg  daily  Continue on nasal saline washes  Take Hydromet 5 mL up to every 6 hours if needed for cough follow up . Please contact office for sooner follow up if symptoms do not improve or worsen or seek emergency care  Follow up Dr. Lamonte Sakai  In 6 weeks  and As needed

## 2015-11-11 NOTE — Patient Instructions (Addendum)
It is nice to meet you today. Continue your prednisone and doxycycline. We will do a lower extremity doppler study to evaluate the left leg pain you are having. We will send in a prescription for Singulair 10 mg each evening Continue Fluticsone 2 sprays each nostril once  Daily. Continue Omeprazole 40 mg daily  Continue nasal  saline washes Continue sips of water instead of throat clearing. We give you a flutter valve today to help with your cough and secretions. We will instruct you on how to use the flutter valve. Use sugar free Jolly Rancher hard candies to soothe your throat. Do Not Use Mint, Menthol,or chocolate. We will send in a prescription for more Hydromet every 6 hours as needed for cough. Do not drive if sleepy. Follow up in 2 weeks . Please contact office for sooner follow up if symptoms do not improve or worsen or seek emergency care

## 2015-11-11 NOTE — Assessment & Plan Note (Signed)
States increasing sleepiness but less compliance with CPAP: Plan: Continue on CPAP at bedtime. You appear to be benefiting from the treatment Goal is to wear for at least 4-6 hours each night for maximal clinical benefit. Please work on wearing each night. Continue to work on weight loss, as the link between excess weight  and sleep apnea is well established.  Do not drive if sleepy. Follow up in 2 weeks or before as needed.

## 2015-11-11 NOTE — Assessment & Plan Note (Signed)
Continued dyspnea on exertion, multifactorial in etiology: obesity/deconditioning/UA irritation.  She is non-compliant with her CPAP at night. CXR 11/07/2015 : No edema or consolidation. Left Calf pain behind knee x 1-2 days. Plan: Lower extremity doppler studies to R/O DVT Education regarding wearing CPAP every night for at least 4 hours. Encouraged weight loss. Follow up in 2 weeks  Please contact office for sooner follow up if symptoms do not improve or worsen or seek emergency care

## 2015-11-11 NOTE — Assessment & Plan Note (Addendum)
Continued Cough due to upper airway irritation syndrome, worsened by chronic rhiitis, chronic GERD, chronic throat clearing. The recurrent injury is due to cough. She is currently on a prednisone taper and completing a course of Doxy.. Allergy and GERD therapy has been increased. Encourage voice rest and avoidance of throat clearing.  Plan: It is nice to meet you today. Continue your prednisone and doxycycline. We will send in a prescription for Singulair 10 mg each evening Continue Fluticsone 2 sprays each nostril once  Daily. Continue Omeprazole 40 mg daily  Continue nasal  saline washes Continue sips of water instead of throat clearing. We give you a flutter valve today to help with your cough and secretions. We will instruct you on how to use the flutter valve. Use sugar free Jolly Rancher hard candies to soothe your throat. Do Not Use Mint, Menthol,or chocolate. We will send in a prescription for more Hydromet every 6 hours as needed for cough. Do not drive if sleepy. Follow up in 2 weeks . Please contact office for sooner follow up if symptoms do not improve or worsen or seek emergency care

## 2015-11-11 NOTE — Telephone Encounter (Signed)
Spoke with pt. She is aware of MW's recommendation. Pt has been scheduled with SG today at 12pm. Nothing further was needed.

## 2015-11-11 NOTE — Progress Notes (Addendum)
History of Present Illness Christy May is a 65 y.o. female former smoker with OSA on CPAP, VCD, and Chronic cough.   11/11/2015    Acute OV/[place] Pt. Was seen 4 days ago in the office for cough and worsening dyspnea. She was placed on Doxy x 7 days and a pred taper and  reminded to use GERD regimen.She was prescribed hydromet cough syrup, and was told  to follow up if not improving. She returns after 4 days with complaints of continued cough and wheeze.  Her nose and eyes  are  running, and she has headaches. She states that there is no pattern to the cough except more day than noct, more dry than wet, and it is helped by sips of water.Her Sputum is clear.She is fatigued.She states she is not resting due to the cough.She is not compliant with her CPAP at night. She states that she did have a large bump on her left calf 1-2 days ago, but that it has gotten better. CXR 4 days ago was read as  no acute process. She denies chest pain, fever, orthopnea or hemoptysis.  Tests 11/07/2015: CXR:  There is no edema or consolidation. Heart size and pulmonary vascularity are normal. No adenopathy. There is mild degenerative change in the thoracic spine. There is postoperative change in the lower cervical region.  IMPRESSION: No edema or consolidation.     Past medical hx Past Medical History  Diagnosis Date  . Anemia, unspecified   . Dehydration   . Depressive disorder, not elsewhere classified   . Esophageal reflux   . Unspecified essential hypertension   . Unspecified menopausal and postmenopausal disorder   . Obesity, unspecified   . Inflammatory and toxic neuropathy, unspecified   . Unspecified vitamin D deficiency   . Dysphagia 2007    historyof dysphagia with severe dysmotility by barium swallow-[name]  . Obesity   . Hypertension   . Allergic bronchitis   . Complication of anesthesia     "I've had recall; I probably stopped breathing at least 2 times" (09/03/2014)   . Family history of adverse reaction to anesthesia     "my brother was told they lost him a couple times during OR"  . OSA (obstructive sleep apnea)     "stopped using my CPAP; just couldn't use it cause it kept me awake and I couldn't keep it on my face'"(09/03/2014)  . Heart murmur   . Chronic airway obstruction, not elsewhere classified     "I was told I don't have this/tests done @ [facility] 05/2014"  . Extrinsic asthma, unspecified     "I was told I don't have this/tests done @ [facility] 05/2014"  . Type II diabetes mellitus ([place])   . History of hiatal hernia   . Headache     "weekly to monthly" (09/03/2014)  . Migraine     "couple times/month right now" (09/03/2014)  . Spondylosis of unspecified site without mention of myelopathy   . Arthritis     "neck, back, hands" (09/03/2014)  . Diabetic peripheral neuropathy ([place])   . Chronic back pain   . Allergy   . Anxiety      Past surgical hx, Family hx, Social hx all reviewed.  Current Outpatient Prescriptions on File Prior to Visit  Medication Sig  . albuterol (PROVENTIL) (2.5 MG/3ML) 0.083% nebulizer solution Take 2.5 mg by nebulization every 6 (six) hours as needed for wheezing or shortness of breath.  . allopurinol (ZYLOPRIM) 300 MG  tablet TAKE 1 TABLET(300 MG) BY MOUTH DAILY  . aspirin 81 MG tablet Take 81 mg by mouth at bedtime.   . Blood Glucose Monitoring Suppl (ONE TOUCH ULTRA 2) w/Device KIT Use to check blood sugar 2 times per day. Dx Code: E11.9  . cholecalciferol (VITAMIN D) 1000 UNITS tablet Take 2,000 Units by mouth every morning.   Marland Kitchen doxycycline (VIBRA-TABS) 100 MG tablet Take 1 tablet (100 mg total) by mouth 2 (two) times daily.  . empagliflozin (JARDIANCE) 10 MG TABS tablet Take 10 mg by mouth daily.  . famotidine (PEPCID) 20 MG tablet TAKE 1 TABLET BY MOUTH EVERY NIGHT AT BEDTIME  . fluticasone (FLONASE) 50 MCG/ACT nasal spray Place 2 sprays into both nostrils every morning.  . folic acid (FOLVITE) 1 MG tablet Take 4  tablets (4 mg total) by mouth daily.  Marland Kitchen gabapentin (NEURONTIN) 800 MG tablet Take 1 tablet (800 mg total) by mouth 3 (three) times daily.  Marland Kitchen glucosamine-chondroitin 500-400 MG tablet Take 1 tablet by mouth 3 (three) times daily.  Marland Kitchen glucose blood (ONE TOUCH ULTRA TEST) test strip USE TO TEST BLOOD SUGAR TWICE DAILY. DX: E10.41  . insulin NPH Human (NOVOLIN N) 100 UNIT/ML injection Inject 1.8 mLs (180 Units total) into the skin daily before breakfast. (Patient taking differently: Inject 150 Units into the skin daily before breakfast. )  . irbesartan (AVAPRO) 300 MG tablet TAKE 1 TABLET(300 MG) BY MOUTH DAILY  . Lancets (ONETOUCH ULTRASOFT) lancets Use to test blood sugars twice a day.  . loratadine (CLARITIN) 10 MG tablet TAKE 1 TABLET BY MOUTH EVERY DAY  . metoprolol (LOPRESSOR) 50 MG tablet Take 1.5 tablets (75 mg total) by mouth 2 (two) times daily.  Marland Kitchen nystatin cream (MYCOSTATIN) APPLY TO AFFECTED AREA TWICE DAILY  . Omega-3 Fatty Acids (FISH OIL) 1200 MG CPDR Take 1,200 mg by mouth daily.  Marland Kitchen omeprazole (PRILOSEC) 40 MG capsule TAKE 1 CAPSULE(40 MG) BY MOUTH DAILY  . oxybutynin (DITROPAN-XL) 10 MG 24 hr tablet Take 1 tablet (10 mg total) by mouth daily.  . predniSONE (DELTASONE) 10 MG tablet 4 tabs for 2 days, then 3 tabs for 2 days, 2 tabs for 2 days, then 1 tab for 2 days, then stop   No current facility-administered medications on file prior to visit.     Allergies  Allergen Reactions  . Ace Inhibitors Anaphylaxis  . Penicillins Hives and Rash  . Adhesive [Tape] Rash  . Tape Hives    Paper tape is ok to use    Review Of Systems:  Constitutional:   No  weight loss, night sweats,  Fevers, chills, fatigue, or  lassitude.  HEENT:   No headaches,  Difficulty swallowing,  Tooth/dental problems, or  Sore throat,                No sneezing, itching, ear ache, + nasal congestion, +post nasal drip,   CV:  No chest pain,  Orthopnea, PND, swelling in lower extremities, anasarca, dizziness,  palpitations, syncope.   GI  No heartburn, indigestion, abdominal pain, nausea, vomiting, diarrhea, change in bowel habits, loss of appetite, bloody stools.   Resp: + shortness of breath with exertion not at rest.  + excess mucus, + productive cough,  No non-productive cough,  No coughing up of blood.  No change in color of mucus.  + wheezing.  No chest wall deformity  Skin: no rash or lesions.  GU: no dysuria, change in color of urine, no urgency or frequency.  No flank pain, no hematuria   MS:  No joint pain or swelling.  No decreased range of motion.  No back pain.  Psych:  No change in mood or affect. No depression or anxiety.  No memory loss.   Vital Signs BP 118/68 mmHg  Pulse 78  Ht _0  (1.626 m)  Wt 306 lb 12.8 oz (139.164 kg)  BMI 52.64 kg/m2  SpO2 97%   Physical Exam:  amb massively obese bm nad  Body mass index is 52.64 kg/(m^2).   General- No distress,  A&Ox3. Obese, appears fatigued. ENT: No sinus tenderness, TM clear, pale nasal mucosa, no oral exudate,+ post nasal drip, no LAN Cardiac: S1, S2, regular rate and rhythm, no murmur Chest: No wheeze/ rales/ dullness; no accessory muscle use, no nasal flaring, no sternal retractions Abd.: Soft Non-tender Ext: No clubbing cyanosis, no def cords/ trace bilat sym pitting edema / tenderness L Pos knee  Neuro:  normal strength Skin: No rashes, warm and dry Psych: normal mood and behavior   Assessment/Plan  GERD Continued Cough due to upper airway irritation syndrome, worsened by chronic rhiitis, chronic GERD, chronic throat clearing. The recurrent injury is due to cough. She is currently on a prednisone taper and completing a course of Doxy.. Allergy and GERD therapy has been increased. Encourage voice rest and avoidance of throat clearing. Of the three most common causes of chronic cough, only one (GERD)  can actually cause the other two (asthma and post nasal drip syndrome)  and perpetuate the cylce of cough  inducing airway trauma, inflammation, heightened sensitivity to reflux which is prompted by the cough itself via a cyclical mechanism.    This may partially respond to steroids and look like asthma and post nasal drainage but never erradicated completely unless the cough and the secondary reflux are eliminated, preferably both at the same time.  While not intuitively obvious, many patients with chronic low grade reflux do not cough until there is a secondary insult that disturbs the protective epithelial barrier and exposes sensitive nerve endings.  This can be viral or direct physical injury such as with an endotracheal tube.   The point is that once this occurs, it is difficult to eliminate using anything but a maximally effective acid suppression regimen at least in the short run, accompanied by an appropriate diet to address non acid GERD.   Plan: Continue your prednisone and doxycycline. Resume  Singulair 10 mg each evening Continue Fluticsone 2 sprays each nostril once  Daily. Continue Omeprazole 40 mg daily Take 30-60 min before first meal of the day  Continue nasal  saline washes Continue sips of water instead of throat clearing. We give you a flutter valve today to help with your cough and secretions. We will instruct you on how to use the flutter valve. Use sugar free Jolly Rancher hard candies to soothe your throat. Do Not Use Mint, Menthol,or chocolate. We will send in a prescription for more Hydromet every 6 hours as needed for cough up to 2 tsp [id number] Do not drive if sleepy. Follow up in 2 weeks . Please contact office for sooner follow up if symptoms do not improve or worsen or seek emergency care    Obstructive sleep apnea States increasing sleepiness but less compliance with CPAP: Plan: Continue on CPAP at bedtime. You appear to be benefiting from the treatment Goal is to wear for at least 4-6 hours each night for maximal clinical benefit. Please work on [place]  night. Continue to work on weight loss, as the link between excess weight  and sleep apnea is well established.  Do not drive if sleepy. Follow up in 2 weeks or before as needed.   Cough Cough due to upper airway irritation syndrome, worsened by chronic rhiitis, chronic GERD, chronic throat clearing. The recurrent injury is due to cough. She is currently on a prednisone taper. Allergy and GERD therapy has been increased. Encourage voice rest and avoidance of throat clearing. Plan: Continue your prednisone and doxycycline. We will do a lower extremity doppler study to evaluate the left leg pain you are having. We will send in a prescription for Singulair 10 mg each evening Continue Fluticsone 2 sprays each nostril once  Daily. Continue Omeprazole 40 mg daily  Continue nasal  saline washes Continue sips of water instead of throat clearing. We give you a flutter valve today to help with your cough and secretions. We will instruct you on how to use the flutter valve. Use sugar free Jolly Rancher hard candies to soothe your throat. Do Not Use Mint, Menthol,or chocolate. Voice rest as able. We will send in a prescription for more Hydromet every 6 hours as needed for cough. Do not drive if sleepy. Follow up in 2 weeks . Please contact office for sooner follow up if symptoms do not improve or worsen or seek emergency care     Dyspnea on exertion Continued dyspnea on exertion, multifactorial in etiology: obesity/deconditioning/UA irritation.  She is non-compliant with her CPAP at night. CXR 11/07/2015 : No edema or consolidation. Left Calf pain behind knee x 1-2 days. Plan: Lower extremity doppler studies to R/O DVT Education regarding wearing CPAP every night for at least 4 hours. Encouraged weight loss. Follow up in 2 weeks  Please contact office for sooner follow up if symptoms do not improve or worsen or seek emergency care    Total time devoted to counseling  = 25/19mreview  case with pt/ discussion of options/alternatives/ personally creating written instructions  in presence of pt  then going over those specific  Instructions directly with the pt including how to use all of the meds but in particular covering each new medication in detail and the difference between the maintenance/automatic meds and the prns using an action plan format for the latter.    MChristinia Gully MD Pulmonary and CRiver [phone] 5:30 PM or weekends, call [phone]

## 2015-11-11 NOTE — Assessment & Plan Note (Signed)
Body mass index is 52.64   Lab Results  Component Value Date   TSH 3.20 07/25/2014     Contributing to gerd tendency/ doe/reviewed the need and the process to achieve and maintain neg calorie balance > defer f/u primary care including intermittently monitoring thyroid status

## 2015-11-11 NOTE — Telephone Encounter (Signed)
Pt requesting for [place] to fax last OV/recent results to Dr Sheilah Mins office.  Aware that records will be sent.  Pt c/o cough-clear mucus, wheezing, HA, pressure behind eyes, head congestion with mucus production-clear and SOB x 5 days ago. Pt taking Doxy BID as directed with Prednisone, Singulair and Hydromet PRN. Pt also using mucus tablets OTC.  Pt reports having only 1 day left of Prednisone.   Patient Instructions 11/07/15     Doxycycline 100mg  Twice daily For 7 days -use sunscreen.  Eat yogurt daily .  Prednisone taper over next week.  Continue fluticasone nasal spray, 2 sprays is not twice a day Continue Singulair 10 mg each evening Continue on Omeprazole to 40 mg daily  Continue on nasal saline washes  Take Hydromet 5 mL up to every 6 hours if needed for cough follow up . Please contact office for sooner follow up if symptoms do not improve or worsen or seek emergency care  Follow up Dr. Lamonte Sakai In 6 weeks and As needed    Please advise Dr Melvyn Novas as Dr Lamonte Sakai is not available. Thanks.

## 2015-11-11 NOTE — Telephone Encounter (Signed)
atc pt, line rang several times, no answer, no vm.  Wcb.  

## 2015-11-11 NOTE — Assessment & Plan Note (Signed)
Cough due to upper airway irritation syndrome, worsened by chronic rhiitis, chronic GERD, chronic throat clearing. The recurrent injury is due to cough. She is currently on a prednisone taper. Allergy and GERD therapy has been increased. Encourage voice rest and avoidance of throat clearing. Plan: Continue your prednisone and doxycycline. We will do a lower extremity doppler study to evaluate the left leg pain you are having. We will send in a prescription for Singulair 10 mg each evening Continue Fluticsone 2 sprays each nostril once  Daily. Continue Omeprazole 40 mg daily  Continue nasal  saline washes Continue sips of water instead of throat clearing. We give you a flutter valve today to help with your cough and secretions. We will instruct you on how to use the flutter valve. Use sugar free Jolly Rancher hard candies to soothe your throat. Do Not Use Mint, Menthol,or chocolate. Voice rest as able. We will send in a prescription for more Hydromet every 6 hours as needed for cough. Do not drive if sleepy. Follow up in 2 weeks . Please contact office for sooner follow up if symptoms do not improve or worsen or seek emergency care

## 2015-11-11 NOTE — Telephone Encounter (Signed)
Nothing else to suggest over the phone but I would be happy to see her as a work in if she'll bring all active meds with her (or see SarahGroce and I will step in to comanage at [facility]

## 2015-11-19 ENCOUNTER — Encounter: Payer: Self-pay | Admitting: Podiatry

## 2015-11-19 ENCOUNTER — Ambulatory Visit (INDEPENDENT_AMBULATORY_CARE_PROVIDER_SITE_OTHER): Payer: PPO | Admitting: Podiatry

## 2015-11-19 DIAGNOSIS — E1149 Type 2 diabetes mellitus with other diabetic neurological complication: Secondary | ICD-10-CM | POA: Diagnosis not present

## 2015-11-19 DIAGNOSIS — M79676 Pain in unspecified toe(s): Secondary | ICD-10-CM | POA: Diagnosis not present

## 2015-11-19 DIAGNOSIS — M2042 Other hammer toe(s) (acquired), left foot: Secondary | ICD-10-CM

## 2015-11-19 DIAGNOSIS — M2041 Other hammer toe(s) (acquired), right foot: Secondary | ICD-10-CM

## 2015-11-19 DIAGNOSIS — M2032 Hallux varus (acquired), left foot: Secondary | ICD-10-CM

## 2015-11-19 DIAGNOSIS — Q828 Other specified congenital malformations of skin: Secondary | ICD-10-CM | POA: Diagnosis not present

## 2015-11-19 DIAGNOSIS — B351 Tinea unguium: Secondary | ICD-10-CM | POA: Diagnosis not present

## 2015-11-19 NOTE — Progress Notes (Signed)
Patient ID: Christy May, female   DOB: 1950/10/08, 65 y.o.   MRN: [id number] Complaint:  Visit Type: Patient returns to my office for continued preventative foot care services. Complaint: Patient states" my nails have grown long and thick and become painful to walk and wear shoes" Patient has been diagnosed with DM with hammer toes and capsulitis right foot.. The patient presents for preventative foot care services. No changes to ROS.  She also has painful callus under the ball of her left foot.  She gives history of injuring her big toe left foot and has developed big toe deformity left foot.  Podiatric Exam: Vascular: dorsalis pedis and posterior tibial pulses are palpable right foot and diminished left foot.. Capillary return is immediate. Temperature gradient is WNL. Skin turgor WNL  Sensorium: Normal Semmes Weinstein monofilament test. Normal tactile sensation bilaterally. Nail Exam: Pt has thick disfigured discolored nails with subungual debris noted bilateral entire nail hallux through fifth toenails Ulcer Exam: There is no evidence of ulcer or pre-ulcerative changes or infection. Orthopedic Exam: Muscle tone and strength are WNL. No limitations in general ROM. No crepitus or effusions noted. Foot type and digits show no abnormalities. Hallux varus left foot.  Hammer toes 3,4,5 right foot. Skin:  Porokeratosis sub 2 left.. No infection or ulcers  Diagnosis:  Onychomycosis, , Pain in right toe, pain in left toes,  Diabetic neuropathy  Hammer toes 3,4,5 right foot.  Hallux varus left foot.  Treatment & Plan Procedures and Treatment: Consent by patient was obtained for treatment procedures. The patient understood the discussion of treatment and procedures well. All questions were answered thoroughly reviewed. Debridement of mycotic and hypertrophic toenails, 1 through 5 bilateral and clearing of subungual debris. No ulceration, no infection noted. Initiate diabetic footgear for neuropathy  with diminished circulation left foot.  Hammer toes 3,4,5 right foot.  Hallux varus 1st MPJ left foot. Return Visit-Office Procedure: Patient instructed to return to the office for a follow up visit 3 months for continued evaluation and treatment.    Gardiner Barefoot DPM

## 2015-11-25 ENCOUNTER — Ambulatory Visit: Payer: PPO | Admitting: Adult Health

## 2015-11-26 ENCOUNTER — Telehealth: Payer: Self-pay | Admitting: Emergency Medicine

## 2015-11-26 ENCOUNTER — Ambulatory Visit: Payer: PPO | Admitting: Adult Health

## 2015-11-26 NOTE — Telephone Encounter (Signed)
Spoke with pt. She is aware of RB's recommendations. Nothing further was needed at this time.

## 2015-11-26 NOTE — Telephone Encounter (Signed)
Difficult for me to know what to prescribe in this situation. I recommend that she try OTC decongestant / cough suppressant such as Tylenol Cold and Flu prn.  Also would try sugar-free candies or cough drops.

## 2015-11-26 NOTE — Telephone Encounter (Signed)
Spoke with pt. Reports cough, runny nose and chest congestion. Cough is producing white mucus. Denies SOB, wheezing or fever. Had an appointment with TP today but she canceled due to not wanting to come. Pt is currently in [place], [place]. Has not tried any OTC medications. She would like to have something called in.  RB - please advise. Thanks.

## 2015-11-28 DIAGNOSIS — R109 Unspecified abdominal pain: Secondary | ICD-10-CM | POA: Diagnosis not present

## 2015-11-28 DIAGNOSIS — R0602 Shortness of breath: Secondary | ICD-10-CM | POA: Diagnosis not present

## 2015-11-28 DIAGNOSIS — E119 Type 2 diabetes mellitus without complications: Secondary | ICD-10-CM | POA: Diagnosis not present

## 2015-11-28 DIAGNOSIS — Z880 Allergy status to penicillin: Secondary | ICD-10-CM | POA: Diagnosis not present

## 2015-11-28 DIAGNOSIS — I10 Essential (primary) hypertension: Secondary | ICD-10-CM | POA: Diagnosis not present

## 2015-12-10 ENCOUNTER — Ambulatory Visit (INDEPENDENT_AMBULATORY_CARE_PROVIDER_SITE_OTHER): Payer: PPO | Admitting: Emergency Medicine

## 2015-12-10 ENCOUNTER — Encounter: Payer: Self-pay | Admitting: Emergency Medicine

## 2015-12-10 VITALS — BP 134/78 | HR 80 | Ht 64.25 in | Wt 304.0 lb

## 2015-12-10 DIAGNOSIS — R059 Cough, unspecified: Secondary | ICD-10-CM

## 2015-12-10 DIAGNOSIS — R05 Cough: Secondary | ICD-10-CM | POA: Diagnosis not present

## 2015-12-10 NOTE — Assessment & Plan Note (Signed)
Refractory cough, very little success with treatment even though we've addressed multiple contributors including GERD, chronic rhinitis, known vocal cord dysfunction with an anxiety component for which she uses relaxation and speech therapy techniques. No evidence currently for infectious process. Nothing to support chronic or acute eosinophilic process. In fact she has not fully responded even to prednisone. I believe she needs a second opinion and I'll will refer her to a tertiary center that specializes in cough.   Please continue omeprazole, flonase, loratadine, singulair, nasal saline.  We will refer you to [facility] for a second opinion regarding your cough, upper airway irritation and options for management Keep doing your speech therapy techniques.  Follow with Dr Lamonte Sakai in 6 months or sooner if you have any problems

## 2015-12-10 NOTE — Progress Notes (Signed)
Subjective:    Patient ID: Christy May, female    DOB: Nov 14, 1950, 65 y.o.   MRN: [id number]  HPI 65 yo woman, former smoker with obesity, OSA, HTN on irbesartan, DM, childhood asthma. Carries a dx of adult asthma made several years ago. Referred for cough. She describes cough that has been prod of white phlegm, started after URI sx about 4 months ago. Describes a tickle in her throat. Can result in stress incontinence.   She has been on Advair in the past. Was recently changed to QVAR + albuterol, uses about 2-3 x a month. She is on omeprazole 20mg  twice a day. She still has occasional reflux sx > can reflux all the way up to throat. She has allergy and rhinitis sx frequently - singulair has been added recently and has helped her.   CXR 12/22/13 - normal.  We will perform full pulmonary function testing  We will change your omeprazole to nexium 40mg  twice a day until our next visit Continue your singulair Stop QVAR for now Keep albuterol available to use 2 puffs if needed for shortness of breath Start loratadine (Claritin) 10mg  daily Start fluticasone nasal spray, 2 sprays each nostril daily Follow with Dr Lamonte Sakai in 1 month  Acute OV 02/01/14 --  Returns for persistent cough. Seen 1 month ago. Seen last month for pulmonary consult for cough.  She is quite upset that her cough is no better and she can not go anywhere due to stress incontinence w/ coughing fits. Has been going on for ~5 months  On Lopressor 75mg  Twice daily   CXR w/ no acute process 12/22/13 .  Former smoker, PFT pending.  Last ov was changed from prilosec to nexium for better GERD control. She continues to have intermittent reflux.  QVAR was held due to no improvement in symptoms. Previous on Advair in past without help.  No fever, orthopnea, edema , n/v/d, discolored mucus. Does have some sinus drainage.  Has used tussionex with some help, but is out of this.   ROV 02/14/14 -- follows for cough, GERD, PND, suspected  asthma. I changed omeprazole to nexium, added loratdine, flonase. TP added pepcid, chlorpheniramine. Her cough has gotten better but has not gone away completely. She is still having. She has been dx w sleep apnea - she has not been able to tolerate CPAP.   PFT done today 02/14/14 > shows some spiro evidence for restriction but for the most part normal. Could not do volumes.    ROV 03/13/14 -- follow up for chronic cough, influenced by GERD and rhinitis. Her PFT are reassuring > little evidence for asthma. Treated recently by Dr Jenny Reichmann for a possible bronchitis and flare. Temporarily improved but then cough right back to where she started.  She is on high dose nexium, flonase, singulair, pepcid qhs.  She is on loratadine qd, not on chlorpheniramine. She is having some nasal congestion. She still has some mild GERD sx.   ROV 08/09/14 -- follow-up visit for chronic cough. She has a history of GERD and allergic rhinitis. We have not been able to establish a diagnosis of asthma based on her spirometry.She had been feeling well, fairly good cough control. Has been going to the speech therapists at [facility] with benefit. She is on singulair, pepcid, Nexium, neurontin, fluticasone, loratadine. She asks today about OSA and CPAP.   ROV 10/30/14 -- follow-up visit for chronic cough, upper airway disease, GERD allergic rhinitis. She has benefited from speech therapy, loratadine,  Singulair, Pepcid, omeprazole,  Neurontin. She has been dx with OSA and we started her on auto-set CPAP since her last visit.  She tells me today that she doesn't have the CPAP yet.  She tells me that she has been having more trouble with her cough since last several weeks. She is coughing up some beige mucous.    ROV 03/05/15 -- chronic cough, upper airway disease, GERD allergic rhinitis. She follows today after her device download > reviewed this today, shows that she has worn it some, 11 / 30 days (37% of the days). She is having trouble  tolerating the mask, but does feel that it helps her, makes her feel better the next day. She just had an exacerbation of her cough in the setting of a URI. Her voice is scratchy for the last several days. Her cough is stable since she completed a pred taper.   Acute OV 07/05/15 -- acute sick visit for history of upper airway disease, chronic cough. She has GERD and allergic rhinitis. Her pulmonary function testing from 02/14/14 is most consistent with restriction. Also with a history of obstructive sleep apnea. She was seen in December by Rexene Edison for apparent acute bronchitis that was treated with azithromycin and prednisone, Hydromet. She was treated again in late Dec with lasix, azithro and hydromet again. She presents today staring that these meds helped her, allowed her to sleep through the night. She is still having nasal gtt, chest tightness, cough. She is being awakened by UE pain, hand pain and neuropathy. She remains on nasal steroid, loratadine, singulair. She is on prilosec 40mg  daily. She is on neurontin.  She has albuterol nebs, uses occasionally, does help some.   She has not been using her CPAP mask for a few weeks, needs to discuss with [facility] to get repairs.   ROV 10/08/15 -- follows up today for obstructive sleep apnea, chronic cough with upper airway disease/VCD in the setting of allergic rhinitis and GERD. She has been seen by SG and TP since our last visit, treated for bronchitis since our last visit.  She has been compliant with her CPAP, wears it reliably. She may have noticed some benefit during day - unsure.   Her cough has been constant, has not had relief with any of our maneuvers in feb or march.  She was treated recently by Dr Criss Rosales for her cough as well. She is taking flonase nasal spray, omeprazole, loratadine, singulair. She may be having some breakthrough GERD sx. She is definitely having persistent allergies. She does [place] occasionally.   ROV 12/10/15 -- visit follow-up  visit for obstructive sleep apnea and chronic cough, VCD. She has been seen 3 times since my last visit with her, treated for acute flares with prednisone. She continues to have frequent cough, sometimes productive of clear mucous. Happens at any time during the day. She uses albuterol nebs periodically > does feel that it helps her.  She is on rx for  GERD and allergic rhinitis. Still has breakthrough GERD sx. She has done speech therapy for VCD and chronic cough before.    Review of Systems As per HPI     Objective:   Physical Exam Filed Vitals:   12/10/15 1558  BP: 134/78  Pulse: 80  Height: 5' 4.25" (1.632 m)  Weight: 304 lb (137.893 kg)  SpO2: 95%    GEN: A/Ox3; depressed affect, obese woman in no distress  HEENT:  OP clear, hoarse voice. No postnasal drip evident  NECK:  No stridor  RESP  Clear bilaterally, no wheeze on forced expiration  CARD:  RRR, no m/r/g, trace edema.   Musco: Warm bil, no deformities or joint swelling noted.   Neuro: alert, no focal deficits noted.    Skin: Warm, no lesions or rashes      Assessment & Plan:  Cough Refractory cough, very little success with treatment even though we've addressed multiple contributors including GERD, chronic rhinitis, known vocal cord dysfunction with an anxiety component for which she uses relaxation and speech therapy techniques. No evidence currently for infectious process. Nothing to support chronic or acute eosinophilic process. In fact she has not fully responded even to prednisone. I believe she needs a second opinion and I'll will refer her to a tertiary center that specializes in cough.   Please continue omeprazole, flonase, loratadine, singulair, nasal saline.  We will refer you to [facility] for a second opinion regarding your cough, upper airway irritation and options for management Keep doing your speech therapy techniques.  Follow with Dr Lamonte Sakai in 6 months or sooner if you have any problems   Baltazar Apo, MD, PhD 12/10/2015, 4:23 PM [facility] [phone] or if no answer [phone]

## 2015-12-10 NOTE — Patient Instructions (Signed)
Please continue omeprazole, flonase, loratadine, singulair, nasal saline.  We will refer you to [facility] for a second opinion regarding your cough, upper airway irritation and options for management Keep doing your speech therapy techniques.  Follow with Dr Lamonte Sakai in 6 months or sooner if you have any problems

## 2015-12-11 DIAGNOSIS — I110 Hypertensive heart disease with heart failure: Secondary | ICD-10-CM | POA: Diagnosis not present

## 2015-12-11 DIAGNOSIS — I517 Cardiomegaly: Secondary | ICD-10-CM | POA: Diagnosis not present

## 2015-12-11 DIAGNOSIS — G4730 Sleep apnea, unspecified: Secondary | ICD-10-CM | POA: Diagnosis not present

## 2015-12-11 DIAGNOSIS — E1342 Other specified diabetes mellitus with diabetic polyneuropathy: Secondary | ICD-10-CM | POA: Diagnosis not present

## 2015-12-11 DIAGNOSIS — K210 Gastro-esophageal reflux disease with esophagitis: Secondary | ICD-10-CM | POA: Diagnosis not present

## 2015-12-17 DIAGNOSIS — M654 Radial styloid tenosynovitis [de Quervain]: Secondary | ICD-10-CM | POA: Diagnosis not present

## 2015-12-17 DIAGNOSIS — M1812 Unilateral primary osteoarthritis of first carpometacarpal joint, left hand: Secondary | ICD-10-CM | POA: Diagnosis not present

## 2015-12-17 DIAGNOSIS — G5602 Carpal tunnel syndrome, left upper limb: Secondary | ICD-10-CM | POA: Diagnosis not present

## 2015-12-17 DIAGNOSIS — M1811 Unilateral primary osteoarthritis of first carpometacarpal joint, right hand: Secondary | ICD-10-CM | POA: Diagnosis not present

## 2015-12-17 DIAGNOSIS — G5601 Carpal tunnel syndrome, right upper limb: Secondary | ICD-10-CM | POA: Diagnosis not present

## 2015-12-23 ENCOUNTER — Other Ambulatory Visit: Payer: Self-pay | Admitting: Internal Medicine

## 2015-12-25 DIAGNOSIS — M797 Fibromyalgia: Secondary | ICD-10-CM | POA: Diagnosis not present

## 2016-01-05 ENCOUNTER — Emergency Department (HOSPITAL_COMMUNITY)
Admission: EM | Admit: 2016-01-05 | Discharge: 2016-01-06 | Disposition: A | Discharge status: Home / Self Care | Payer: PPO | Attending: Physician Assistant | Admitting: Physician Assistant

## 2016-01-05 ENCOUNTER — Encounter (HOSPITAL_COMMUNITY): Payer: Self-pay | Admitting: Emergency Medicine

## 2016-01-05 ENCOUNTER — Emergency Department (HOSPITAL_COMMUNITY): Payer: PPO

## 2016-01-05 DIAGNOSIS — R079 Chest pain, unspecified: Secondary | ICD-10-CM | POA: Insufficient documentation

## 2016-01-05 DIAGNOSIS — J449 Chronic obstructive pulmonary disease, unspecified: Secondary | ICD-10-CM | POA: Diagnosis not present

## 2016-01-05 DIAGNOSIS — J45909 Unspecified asthma, uncomplicated: Secondary | ICD-10-CM | POA: Diagnosis not present

## 2016-01-05 DIAGNOSIS — I10 Essential (primary) hypertension: Secondary | ICD-10-CM | POA: Diagnosis not present

## 2016-01-05 DIAGNOSIS — E119 Type 2 diabetes mellitus without complications: Secondary | ICD-10-CM | POA: Diagnosis not present

## 2016-01-05 DIAGNOSIS — Z7951 Long term (current) use of inhaled steroids: Secondary | ICD-10-CM | POA: Insufficient documentation

## 2016-01-05 DIAGNOSIS — R071 Chest pain on breathing: Secondary | ICD-10-CM | POA: Diagnosis not present

## 2016-01-05 DIAGNOSIS — Z7982 Long term (current) use of aspirin: Secondary | ICD-10-CM | POA: Insufficient documentation

## 2016-01-05 DIAGNOSIS — R0602 Shortness of breath: Secondary | ICD-10-CM | POA: Diagnosis not present

## 2016-01-05 DIAGNOSIS — Z794 Long term (current) use of insulin: Secondary | ICD-10-CM | POA: Insufficient documentation

## 2016-01-05 DIAGNOSIS — Z87891 Personal history of nicotine dependence: Secondary | ICD-10-CM | POA: Diagnosis not present

## 2016-01-05 DIAGNOSIS — Z79899 Other long term (current) drug therapy: Secondary | ICD-10-CM | POA: Diagnosis not present

## 2016-01-05 DIAGNOSIS — F329 Major depressive disorder, single episode, unspecified: Secondary | ICD-10-CM | POA: Diagnosis not present

## 2016-01-05 LAB — D-DIMER, QUANTITATIVE: D-Dimer, Quant: 0.5 ug/mL-FEU (ref 0.00–0.50)

## 2016-01-05 LAB — CBC WITH DIFFERENTIAL/PLATELET
Basophils Absolute: 0 10*3/uL (ref 0.0–0.1)
Basophils Relative: 0 %
EOS ABS: 0.2 10*3/uL (ref 0.0–0.7)
EOS PCT: 3 %
HCT: 41 % (ref 36.0–46.0)
HEMOGLOBIN: 13.4 g/dL (ref 12.0–15.0)
LYMPHS ABS: 2.3 10*3/uL (ref 0.7–4.0)
Lymphocytes Relative: 39 %
MCH: 28.9 pg (ref 26.0–34.0)
MCHC: 32.7 g/dL (ref 30.0–36.0)
MCV: 88.6 fL (ref 78.0–100.0)
MONO ABS: 0.5 10*3/uL (ref 0.1–1.0)
MONOS PCT: 8 %
NEUTROS PCT: 50 %
Neutro Abs: 3 10*3/uL (ref 1.7–7.7)
Platelets: 181 10*3/uL (ref 150–400)
RBC: 4.63 MIL/uL (ref 3.87–5.11)
RDW: 15.2 % (ref 11.5–15.5)
WBC: 5.8 10*3/uL (ref 4.0–10.5)

## 2016-01-05 LAB — BASIC METABOLIC PANEL
Anion gap: 7 (ref 5–15)
BUN: 24 mg/dL — AB (ref 6–20)
CALCIUM: 9.5 mg/dL (ref 8.9–10.3)
CHLORIDE: 99 mmol/L — AB (ref 101–111)
CO2: 30 mmol/L (ref 22–32)
CREATININE: 1.35 mg/dL — AB (ref 0.44–1.00)
GFR calc Af Amer: 46 mL/min — ABNORMAL LOW (ref >60)
GFR calc non Af Amer: 40 mL/min — ABNORMAL LOW (ref >60)
GLUCOSE: 268 mg/dL — AB (ref 65–99)
Potassium: 4.7 mmol/L (ref 3.5–5.1)
Sodium: 136 mmol/L (ref 135–145)

## 2016-01-05 LAB — I-STAT TROPONIN, ED: TROPONIN I, POC: 0 ng/mL (ref 0.00–0.08)

## 2016-01-05 MED ORDER — FENTANYL CITRATE (PF) 100 MCG/2ML IJ SOLN
50.0000 ug | Freq: Once | INTRAMUSCULAR | Status: AC
  Administered 2016-01-06: 50 ug via INTRAVENOUS
  Filled 2016-01-05: qty 2

## 2016-01-05 NOTE — ED Provider Notes (Signed)
CSN: [id number]     Arrival date & time 01/05/16  2131 History  By signing my name below, I, Christy May, attest that this documentation has been prepared under the direction and in the presence of Custer Pimenta, [place]. Electronically Signed: Georgette May, ED Scribe. 01/05/2016. 10:11 PM.   Chief Complaint  Patient presents with  . Shortness of Breath   The history is provided by the patient. No language interpreter was used.   HPI Comments: Christy May is a 66 y.o. female with h/o COPD, IDDM, OSA (noncompliant with CPAP) and HTN who presents to the Emergency Department by EMS complaining of constant chest pain and difficulty breathing onset a couple of days ago that worsened today. She states it feels like an "elephant is sitting on my chest." Pt states pain is exacerbated with standing, movement, and breathing. Pt states every few months she will have a similar episode of pain and has always been told it is "a muscle problem."  No alleviating factors noted or home remedies tried. Pt has no h/o heart disease. Pt denies vomiting, headaches, blurry vision, diaphoresis. Denies leg pain or swelling. She has chronic dyspnea on exertion. Had a stress test and echo in January this year that was unremarkable. Denies current tobacco, alcohol, or drug use.  Past Medical History  Diagnosis Date  . Anemia, unspecified   . Dehydration   . Depressive disorder, not elsewhere classified   . Esophageal reflux   . Unspecified essential hypertension   . Unspecified menopausal and postmenopausal disorder   . Obesity, unspecified   . Inflammatory and toxic neuropathy, unspecified   . Unspecified vitamin D deficiency   . Dysphagia 2007    historyof dysphagia with severe dysmotility by barium swallow-[name]  . Obesity   . Hypertension   . Allergic bronchitis   . Complication of anesthesia     "I've had recall; I probably stopped breathing at least 2 times" (09/03/2014)  . Family history of adverse reaction  to anesthesia     "my brother was told they lost him a couple times during OR"  . OSA (obstructive sleep apnea)     "stopped using my CPAP; just couldn't use it cause it kept me awake and I couldn't keep it on my face'"(09/03/2014)  . Heart murmur   . Chronic airway obstruction, not elsewhere classified     "I was told I don't have this/tests done @ [facility] 05/2014"  . Extrinsic asthma, unspecified     "I was told I don't have this/tests done @ [facility] 05/2014"  . Type II diabetes mellitus ([place])   . History of hiatal hernia   . Headache     "weekly to monthly" (09/03/2014)  . Migraine     "couple times/month right now" (09/03/2014)  . Spondylosis of unspecified site without mention of myelopathy   . Arthritis     "neck, back, hands" (09/03/2014)  . Diabetic peripheral neuropathy ([place])   . Chronic back pain   . Allergy   . Anxiety    Past Surgical History  Procedure Laterality Date  . Refractive surgery Bilateral   . Anterior cervical decomp/discectomy fusion      multiple cervical spine levels;Dr. Arnoldo Morale  . Incision and drainage abscess      Chest  . Dilation and curettage of uterus    . Exploratory laparotomy    . Total knee arthroplasty Left 10/2003  . Total knee arthroplasty Right 01/2004    [name]  .  Foot surgery Right X 2    "took blood out of my arms and put platelets in my feet"  . Appendectomy    . Tonsillectomy    . Esophagogastroduodenoscopy N/A 05/08/2014    Procedure: ESOPHAGOGASTRODUODENOSCOPY (EGD);  Surgeon: [name], MD;  Location: [name];  Service: Endoscopy;  Laterality: N/A;  . Colonoscopy N/A 05/08/2014    Procedure: COLONOSCOPY;  Surgeon: [name], MD;  Location: [facility];  Service: Endoscopy;  Laterality: N/A;  . Back surgery    . Cholecystectomy open    . Abdominal hysterectomy      "partial"  . Eye surgery    . Joint replacement     Family History  Problem Relation Age of Onset  . Esophageal cancer Brother   . Esophageal  cancer Sister     ?  . Colon polyps Brother   . Pancreatic cancer Sister     ?  . Diabetes Sister   . Diabetes      Aunt and Uncle  . Heart disease Maternal Grandfather    Social History  Substance Use Topics  . Smoking status: Former Smoker -- 0.00 packs/day for 0 years    Types: Cigarettes    Quit date: 06/22/1986  . Smokeless tobacco: Never Used  . Alcohol Use: Yes   OB History    No data available     Review of Systems A complete 10 system review of systems was obtained and all systems are negative except as noted in the HPI and PMH.   Allergies  Ace inhibitors; Penicillins; and Adhesive  Home Medications   Prior to Admission medications   Medication Sig Start Date End Date Taking? Authorizing Provider  albuterol (PROVENTIL) (2.5 MG/3ML) 0.083% nebulizer solution Take 2.5 mg by nebulization every 6 (six) hours as needed for wheezing or shortness of breath.   Yes Historical Provider, MD  allopurinol (ZYLOPRIM) 300 MG tablet Take 300 mg by mouth daily.   Yes Historical Provider, MD  aspirin EC 81 MG tablet Take 81 mg by mouth at bedtime.   Yes Historical Provider, MD  chlorpheniramine (CHLORPHEN) 4 MG tablet Take 4 mg by mouth daily.   Yes Historical Provider, MD  cholecalciferol (VITAMIN D) 1000 UNITS tablet Take 2,000 Units by mouth daily.    Yes Historical Provider, MD  famotidine (PEPCID) 20 MG tablet Take 20 mg by mouth at bedtime.   Yes Historical Provider, MD  fluticasone (FLONASE) 50 MCG/ACT nasal spray Place 2 sprays into both nostrils daily.   Yes Historical Provider, MD  folic acid (FOLVITE) 1 MG tablet Take 4 tablets (4 mg total) by mouth daily. 02/20/15  Yes Renato Shin, MD  gabapentin (NEURONTIN) 800 MG tablet Take 1 tablet (800 mg total) by mouth 3 (three) times daily. 02/27/15  Yes Biagio Borg, MD  insulin NPH Human (HUMULIN N,NOVOLIN N) 100 UNIT/ML injection Inject 150 Units into the skin daily before breakfast.   Yes Historical Provider, MD  irbesartan  (AVAPRO) 300 MG tablet Take 300 mg by mouth daily.   Yes Historical Provider, MD  loratadine (CLARITIN) 10 MG tablet Take 10 mg by mouth daily.   Yes Historical Provider, MD  metoprolol (LOPRESSOR) 50 MG tablet Take 75 mg by mouth 2 (two) times daily.   Yes Historical Provider, MD  montelukast (SINGULAIR) 10 MG tablet Take 10 mg by mouth at bedtime.   Yes Historical Provider, MD  nystatin cream (MYCOSTATIN) Apply 1 application topically 2 (two) times daily as  needed for dry skin.   Yes Historical Provider, MD  Omega-3 Fatty Acids (FISH OIL) 1200 MG CPDR Take 1,200 mg by mouth daily.   Yes Historical Provider, MD  omeprazole (PRILOSEC) 40 MG capsule Take 40 mg by mouth daily.   Yes Historical Provider, MD  oxybutynin (DITROPAN-XL) 10 MG 24 hr tablet Take 1 tablet (10 mg total) by mouth daily. 02/18/15  Yes Biagio Borg, MD  sodium chloride (OCEAN) 0.65 % SOLN nasal spray Place 2 sprays into both nostrils 2 (two) times daily.   Yes Historical Provider, MD  Specialty Vitamins Products (ADRENAL STRESS CALM) TABS Take 2 tablets by mouth at bedtime.   Yes Historical Provider, MD   BP 113/81 mmHg  Pulse 89  Temp(Src) 98 F (36.7 C) (Oral)  Resp 14  SpO2 92% Physical Exam  Constitutional: She is oriented to person, place, and time. She appears well-developed and well-nourished. No distress.  Morbidly obese, NAD  HENT:  Head: Normocephalic.  Eyes: Conjunctivae are normal. Pupils are equal, round, and reactive to light. No scleral icterus.  Neck: Normal range of motion. Neck supple. No thyromegaly present.  Cardiovascular: Normal rate and regular rhythm.  Exam reveals no gallop and no friction rub.   No murmur heard. Pulmonary/Chest: Effort normal and breath sounds normal. No respiratory distress. She has no wheezes. She has no rales. She exhibits tenderness.    Abdominal: Soft. Bowel sounds are normal. She exhibits no distension. There is no tenderness. There is no rebound.  Musculoskeletal:  Normal range of motion.  Neurological: She is alert and oriented to person, place, and time.  Skin: Skin is warm and dry. No rash noted.  Psychiatric: She has a normal mood and affect. Her behavior is normal.    ED Course  Procedures  DIAGNOSTIC STUDIES: Oxygen Saturation is 98% on 3L [place], normal by my interpretation.    COORDINATION OF CARE: 10:08 PM Discussed treatment plan with pt at bedside which includes blood work and CXR and pt agreed to plan.  Labs Review Labs Reviewed  BASIC METABOLIC PANEL - Abnormal; Notable for the following:    Chloride 99 (*)    Glucose, Bld 268 (*)    BUN 24 (*)    Creatinine, Ser 1.35 (*)    GFR calc non Af Amer 40 (*)    GFR calc Af Amer 46 (*)    All other components within normal limits  CBC WITH DIFFERENTIAL/PLATELET  D-DIMER, QUANTITATIVE (NOT AT [facility])  Randolm Idol, ED    Imaging Review Dg Chest 2 View  01/05/2016  CLINICAL DATA:  Chest pain EXAM: CHEST  2 VIEW COMPARISON:  11/07/2015 FINDINGS: Artifact from EKG leads. Normal heart size and mediastinal contours. Mild perihilar scar in the lateral projection, stable. No acute infiltrate or edema. No effusion or pneumothorax. Multilevel cervical discectomy. Thoracic spondylosis. No acute osseous findings. IMPRESSION: No active cardiopulmonary disease. Electronically Signed   By: Monte Fantasia M.D.   On: 01/05/2016 23:52   I have personally reviewed and evaluated these images and lab results as part of my medical decision-making.   EKG Interpretation   Date/Time:  Sunday January 05 2016 21:40:59 EDT Ventricular Rate:  92 PR Interval:    QRS Duration: 78 QT Interval:  413 QTC Calculation: 511 R Axis:   -8 Text Interpretation:  Sinus rhythm Left ventricular hypertrophy Prolonged  QT interval no acute ischemia.  Confirmed by Gerald Leitz ([id number]) on  01/05/2016 9:47:02 PM      MDM  Final diagnoses:  Chest pain, unspecified chest pain type    Placed on nasal cannula even  though initial SpO2 was 90-92% on RA. Will wean to room air to assess oxygen requirement. Pt states she does not use supplemental oxygen at home. Will check labs including cbc, bmp, troponin, and d-dimer. EKG nonacute. CXR pending.  Workup unremarkable. Pain resolved in the ED. Pt continues to breathe comfortably with no hypoxia on room air. Will d/c home with instructions for close pulmonology and PCP f/u. ER return precautions given.  I personally performed the services described in this documentation, which was scribed in my presence. The recorded information has been reviewed and is accurate.   Anne Ng, PA-C 01/06/16 [place], MD 01/11/16 [phone]

## 2016-01-05 NOTE — ED Notes (Signed)
Pt c/o shortness of breath and right sided chest/rib pain x3 weeks. Pt states she was seen in [place], [place] for same a few weeks ago, but was unable to elaborate on details of visit. Pt arrived alert and oriented x4. O2 sat 90% on RA. Pt placed on 3L  sating 95%.

## 2016-01-05 NOTE — ED Notes (Signed)
Bed: [id number] Expected date:  Expected time:  Means of arrival:  Comments: EMS 65 yo female SOB/hx COPD 126/60 NSR 94 CBG 272 92% RA/3l/[place]-now 96%

## 2016-01-06 DIAGNOSIS — R0602 Shortness of breath: Secondary | ICD-10-CM | POA: Diagnosis not present

## 2016-01-06 NOTE — Discharge Instructions (Signed)
You were seen in the emergency room today for evaluation of chest pain. Your workup was completely negative. Your pain improved with medicine. Please follow up with your primary care provider and pulmonology team as soon as possible. Take all home medications as prescribed. Return to the ER for new or worsening symptoms.

## 2016-01-08 ENCOUNTER — Encounter: Payer: Self-pay | Admitting: Gastroenterology

## 2016-01-09 DIAGNOSIS — G4730 Sleep apnea, unspecified: Secondary | ICD-10-CM | POA: Diagnosis not present

## 2016-01-09 DIAGNOSIS — I10 Essential (primary) hypertension: Secondary | ICD-10-CM | POA: Diagnosis not present

## 2016-01-09 DIAGNOSIS — M797 Fibromyalgia: Secondary | ICD-10-CM | POA: Diagnosis not present

## 2016-01-15 ENCOUNTER — Ambulatory Visit: Payer: PPO | Admitting: Endocrinology

## 2016-01-23 DIAGNOSIS — K210 Gastro-esophageal reflux disease with esophagitis: Secondary | ICD-10-CM | POA: Diagnosis not present

## 2016-01-23 DIAGNOSIS — I517 Cardiomegaly: Secondary | ICD-10-CM | POA: Diagnosis not present

## 2016-01-23 DIAGNOSIS — E1342 Other specified diabetes mellitus with diabetic polyneuropathy: Secondary | ICD-10-CM | POA: Diagnosis not present

## 2016-01-24 DIAGNOSIS — J383 Other diseases of vocal cords: Secondary | ICD-10-CM | POA: Diagnosis not present

## 2016-01-24 DIAGNOSIS — G4733 Obstructive sleep apnea (adult) (pediatric): Secondary | ICD-10-CM | POA: Diagnosis not present

## 2016-01-24 DIAGNOSIS — K219 Gastro-esophageal reflux disease without esophagitis: Secondary | ICD-10-CM | POA: Diagnosis not present

## 2016-01-24 DIAGNOSIS — Z8709 Personal history of other diseases of the respiratory system: Secondary | ICD-10-CM | POA: Diagnosis not present

## 2016-01-24 DIAGNOSIS — R05 Cough: Secondary | ICD-10-CM | POA: Diagnosis not present

## 2016-01-24 DIAGNOSIS — R0609 Other forms of dyspnea: Secondary | ICD-10-CM | POA: Diagnosis not present

## 2016-02-12 ENCOUNTER — Encounter: Payer: Self-pay | Admitting: Gastroenterology

## 2016-02-18 ENCOUNTER — Ambulatory Visit (INDEPENDENT_AMBULATORY_CARE_PROVIDER_SITE_OTHER): Payer: PPO | Admitting: Endocrinology

## 2016-02-18 ENCOUNTER — Encounter: Payer: Self-pay | Admitting: Endocrinology

## 2016-02-18 ENCOUNTER — Encounter: Payer: Self-pay | Admitting: Podiatry

## 2016-02-18 ENCOUNTER — Ambulatory Visit (INDEPENDENT_AMBULATORY_CARE_PROVIDER_SITE_OTHER): Payer: PPO | Admitting: Podiatry

## 2016-02-18 VITALS — BP 118/60 | HR 72 | Temp 97.8°F | Wt 312.0 lb

## 2016-02-18 DIAGNOSIS — Q828 Other specified congenital malformations of skin: Secondary | ICD-10-CM

## 2016-02-18 DIAGNOSIS — M79676 Pain in unspecified toe(s): Secondary | ICD-10-CM

## 2016-02-18 DIAGNOSIS — B351 Tinea unguium: Secondary | ICD-10-CM | POA: Diagnosis not present

## 2016-02-18 DIAGNOSIS — E1149 Type 2 diabetes mellitus with other diabetic neurological complication: Secondary | ICD-10-CM | POA: Diagnosis not present

## 2016-02-18 DIAGNOSIS — E119 Type 2 diabetes mellitus without complications: Secondary | ICD-10-CM

## 2016-02-18 LAB — POCT GLYCOSYLATED HEMOGLOBIN (HGB A1C): Hemoglobin A1C: 8.1

## 2016-02-18 LAB — GLUCOSE, POCT (MANUAL RESULT ENTRY): POC Glucose: 77 mg/dl (ref 70–99)

## 2016-02-18 MED ORDER — INSULIN NPH (HUMAN) (ISOPHANE) 100 UNIT/ML ~~LOC~~ SUSP: 160.0000 [IU] | Freq: Every day | SUBCUTANEOUS | 11 refills | Status: DC

## 2016-02-18 NOTE — Progress Notes (Signed)
Patient ID: Christy May, female   DOB: 03/29/1951, 65 y.o.   MRN: [id number] Complaint:  Visit Type: Patient returns to my office for continued preventative foot care services. Complaint: Patient states" my nails have grown long and thick and become painful to walk and wear shoes" Patient has been diagnosed with DM with hammer toes and capsulitis right foot.. The patient presents for preventative foot care services. No changes to ROS.  She also has painful callus under the ball of her left foot.    Podiatric Exam: Vascular: dorsalis pedis and posterior tibial pulses are palpable right foot and diminished left foot.. Capillary return is immediate. Temperature gradient is WNL. Skin turgor WNL  Sensorium: Normal Semmes Weinstein monofilament test. Normal tactile sensation bilaterally. Nail Exam: Pt has thick disfigured discolored nails with subungual debris noted bilateral entire nail hallux through fifth toenails Ulcer Exam: There is no evidence of ulcer or pre-ulcerative changes or infection. Orthopedic Exam: Muscle tone and strength are WNL. No limitations in general ROM. No crepitus or effusions noted. Foot type and digits show no abnormalities. Hallux varus left foot.  Hammer toes 3,4,5 right foot. Skin:  Porokeratosis sub 2 left.. No infection or ulcers  Diagnosis:  Onychomycosis, , Pain in right toe, pain in left toes,  Diabetic neuropathy  Hammer toes 3,4,5 right foot.  Hallux varus left foot.  Treatment & Plan Procedures and Treatment: Consent by patient was obtained for treatment procedures. The patient understood the discussion of treatment and procedures well. All questions were answered thoroughly reviewed. Debridement of mycotic and hypertrophic toenails, 1 through 5 bilateral and clearing of subungual debris. No ulceration, no infection noted.   Hammer toes 3,4,5 right foot.  Hallux varus 1st MPJ left foot. Return Visit-Office Procedure: Patient instructed to return to the office for  a follow up visit 3 months for continued evaluation and treatment.    Gardiner Barefoot DPMPatient ID: Christy May, female   DOB: [birth year], 65 y.o.   MRN: [id number] Complaint:  Visit Type: Patient returns to my office for continued preventative foot care services. Complaint: Patient states" my nails have grown long and thick and become painful to walk and wear shoes" Patient has been diagnosed with DM with hammer toes and capsulitis right foot.. The patient presents for preventative foot care services. No changes to ROS.  She also has painful callus under the ball of her left foot.  She gives history of injuring her big toe left foot and has developed big toe deformity left foot.  Podiatric Exam: Vascular: dorsalis pedis and posterior tibial pulses are palpable right foot and diminished left foot.. Capillary return is immediate. Temperature gradient is WNL. Skin turgor WNL  Sensorium: Normal Semmes Weinstein monofilament test. Normal tactile sensation bilaterally. Nail Exam: Pt has thick disfigured discolored nails with subungual debris noted bilateral entire nail hallux through fifth toenails Ulcer Exam: There is no evidence of ulcer or pre-ulcerative changes or infection. Orthopedic Exam: Muscle tone and strength are WNL. No limitations in general ROM. No crepitus or effusions noted. Foot type and digits show no abnormalities. Hallux varus left foot.  Hammer toes 3,4,5 right foot. Skin:  Porokeratosis sub 2 left.. No infection or ulcers  Diagnosis:  Onychomycosis, , Pain in right toe, pain in left toes,  Diabetic neuropathy  Hammer toes 3,4,5 right foot.  Hallux varus left foot.  Treatment & Plan Procedures and Treatment: Consent by patient was obtained for treatment procedures. The patient understood the discussion of treatment and procedures  well. All questions were answered thoroughly reviewed. Debridement of mycotic and hypertrophic toenails, 1 through 5 bilateral and clearing of  subungual..  Hammer toes 3,4,5 right foot.  Hallux varus 1st MPJ left foot.  Debride porokeratosis sub 2 left. Return Visit-Office Procedure: Patient instructed to return to the office for a follow up visit 3 months for continued evaluation and treatment.    Gardiner Barefoot DPM

## 2016-02-18 NOTE — Progress Notes (Signed)
Subjective:    Patient ID: Christy May, female    DOB: [birth year], 65 y.o.   MRN: [id number]  HPI Pt returns for f/u of diabetes mellitus: DM type: Insulin-requiring type 2 Dx'ed: [id number] Complications: polyneuropathy and renal insufficiency Therapy: insulin since 2002.   GDM: never DKA: never Severe hypoglycemia: never.  Pancreatitis: never.  Other: she was changed to a qd insulin regimen, after poor results on multiple daily injections; she wants the cheapest possible insulin.  She declines weight loss surgery.   Interval history: no cbg record, but states cbg's vary from 57 (afternoon, after she missed lunch), up to the 300's.  It is lowest in the afternoon, if she misses lunch.  pt states she feels well in general.  No recent steroids. She says she never misses the insulin.  Past Medical History:  Diagnosis Date  . Allergic bronchitis   . Allergy   . Anemia, unspecified   . Anxiety   . Arthritis    "neck, back, hands" (09/03/2014)  . Chronic airway obstruction, not elsewhere classified    "I was told I don't have this/tests done @ [facility] 05/2014"  . Chronic back pain   . Complication of anesthesia    "I've had recall; I probably stopped breathing at least 2 times" (09/03/2014)  . Dehydration   . Depressive disorder, not elsewhere classified   . Diabetic peripheral neuropathy ([place])   . Dysphagia 2007   historyof dysphagia with severe dysmotility by barium swallow-[name]  . Esophageal reflux   . Extrinsic asthma, unspecified    "I was told I don't have this/tests done @ [facility] 05/2014"  . Family history of adverse reaction to anesthesia    "my brother was told they lost him a couple times during OR"  . Headache    "weekly to monthly" (09/03/2014)  . Heart murmur   . History of hiatal hernia   . Hypertension   . Inflammatory and toxic neuropathy, unspecified   . Migraine    "couple times/month right now" (09/03/2014)  . Obesity   . Obesity, unspecified   .  OSA (obstructive sleep apnea)    "stopped using my CPAP; just couldn't use it cause it kept me awake and I couldn't keep it on my face'"(09/03/2014)  . Spondylosis of unspecified site without mention of myelopathy   . Type II diabetes mellitus ([place])   . Unspecified essential hypertension   . Unspecified menopausal and postmenopausal disorder   . Unspecified vitamin D deficiency     Past Surgical History:  Procedure Laterality Date  . ABDOMINAL HYSTERECTOMY     "partial"  . ANTERIOR CERVICAL DECOMP/DISCECTOMY FUSION     multiple cervical spine levels;Dr. Arnoldo Morale  . APPENDECTOMY    . BACK SURGERY    . CHOLECYSTECTOMY OPEN    . COLONOSCOPY N/A 05/08/2014   Procedure: COLONOSCOPY;  Surgeon: [name], MD;  Location: [facility];  Service: Endoscopy;  Laterality: N/A;  . DILATION AND CURETTAGE OF UTERUS    . ESOPHAGOGASTRODUODENOSCOPY N/A 05/08/2014   Procedure: ESOPHAGOGASTRODUODENOSCOPY (EGD);  Surgeon: [name], MD;  Location: [name];  Service: Endoscopy;  Laterality: N/A;  . EXPLORATORY LAPAROTOMY    . EYE SURGERY    . FOOT SURGERY Right X 2   "took blood out of my arms and put platelets in my feet"  . INCISION AND DRAINAGE ABSCESS     Chest  . JOINT REPLACEMENT    . REFRACTIVE SURGERY Bilateral   . TONSILLECTOMY    .  TOTAL KNEE ARTHROPLASTY Left 10/2003  . TOTAL KNEE ARTHROPLASTY Right 01/2004   [name]    Social History   Social History  . Marital status: Widowed    Spouse name: N/A  . Number of children: N/A  . Years of education: N/A   Occupational History  . disabled Disabled   Social History Main Topics  . Smoking status: Former Smoker    Packs/day: 0.00    Years: 0.00    Types: Cigarettes    Quit date: 06/22/1986  . Smokeless tobacco: Never Used  . Alcohol use Yes  . Drug use: No  . Sexual activity: Not on file   Other Topics Concern  . Not on file   Social History Narrative   ** Merged History Encounter **       Patient does not get  regular exercise.      Widowed 2004      Disabled    Current Outpatient Prescriptions on File Prior to Visit  Medication Sig Dispense Refill  . albuterol (PROVENTIL) (2.5 MG/3ML) 0.083% nebulizer solution Take 2.5 mg by nebulization every 6 (six) hours as needed for wheezing or shortness of breath.    . allopurinol (ZYLOPRIM) 300 MG tablet Take 300 mg by mouth daily.    Marland Kitchen aspirin EC 81 MG tablet Take 81 mg by mouth at bedtime.    . chlorpheniramine (CHLORPHEN) 4 MG tablet Take 4 mg by mouth daily.    . cholecalciferol (VITAMIN D) 1000 UNITS tablet Take 2,000 Units by mouth daily.     . famotidine (PEPCID) 20 MG tablet Take 20 mg by mouth at bedtime.    . fluticasone (FLONASE) 50 MCG/ACT nasal spray Place 2 sprays into both nostrils daily.    . folic acid (FOLVITE) 1 MG tablet Take 4 tablets (4 mg total) by mouth daily. 120 tablet 11  . gabapentin (NEURONTIN) 800 MG tablet Take 1 tablet (800 mg total) by mouth 3 (three) times daily. 270 tablet 1  . irbesartan (AVAPRO) 300 MG tablet Take 300 mg by mouth daily.    Marland Kitchen loratadine (CLARITIN) 10 MG tablet Take 10 mg by mouth daily.    . metoprolol (LOPRESSOR) 50 MG tablet Take 75 mg by mouth 2 (two) times daily.    . montelukast (SINGULAIR) 10 MG tablet Take 10 mg by mouth at bedtime.    Marland Kitchen nystatin cream (MYCOSTATIN) Apply 1 application topically 2 (two) times daily as needed for dry skin.    . Omega-3 Fatty Acids (FISH OIL) 1200 MG CPDR Take 1,200 mg by mouth daily.    Marland Kitchen omeprazole (PRILOSEC) 40 MG capsule Take 40 mg by mouth daily.    Marland Kitchen oxybutynin (DITROPAN-XL) 10 MG 24 hr tablet Take 1 tablet (10 mg total) by mouth daily. 90 tablet 1  . sodium chloride (OCEAN) 0.65 % SOLN nasal spray Place 2 sprays into both nostrils 2 (two) times daily.     No current facility-administered medications on file prior to visit.     Allergies  Allergen Reactions  . Ace Inhibitors Anaphylaxis and Cough  . Penicillins Hives and Other (See Comments)    Has  patient had a PCN reaction causing immediate rash, facial/tongue/throat swelling, SOB or lightheadedness with hypotension:  No Has patient had a PCN reaction causing severe rash involving mucus membranes or skin necrosis:  No Has patient had a PCN reaction that required hospitalization No Has patient had a PCN reaction occurring within the last 10 years: No If all  of the above answers are "NO", then may proceed with Cephalosporin use.  . Adhesive [Tape] Hives    Family History  Problem Relation Age of Onset  . Esophageal cancer Brother   . Esophageal cancer Sister     ?  . Colon polyps Brother   . Pancreatic cancer Sister     ?  . Diabetes Sister   . Diabetes      Aunt and Uncle  . Heart disease Maternal Grandfather     BP 118/60 (BP Location: Left Arm, Patient Position: Sitting, Cuff Size: Large)   Pulse 72   Temp 97.8 F (36.6 C) (Oral)   Wt (!) 312 lb (141.5 kg)   BMI 53.14 kg/m     Review of Systems Denies LOC    Objective:   Physical Exam VITAL SIGNS:  See vs page GENERAL: no distress Pulses: dorsalis pedis intact bilat.   MSK: no deformity of the feet CV: trace bilat leg edema Skin:  no ulcer on the feet.  normal color and temp on the feet. Neuro: sensation is intact to touch on the feet.      Lab Results  Component Value Date   HGBA1C 8.1 02/18/2016      Assessment & Plan:  Insulin-requiring type 2 DM: she needs increased rx.

## 2016-02-18 NOTE — Patient Instructions (Addendum)
Please increase the insulin to 160 units each morning On this type of insulin schedule, you should eat meals on a regular schedule.  If a meal is missed or significantly delayed, your blood sugar could go low.   check your blood sugar twice a day.  vary the time of day when you check, between before the 3 meals, and at bedtime.  also check if you have symptoms of your blood sugar being too high or too low.  please keep a record of the readings and bring it to your next appointment here.  You can write it on any piece of paper.  please call us sooner if your blood sugar goes below 70, or if you have a lot of readings over 200.   Please come back for a follow-up appointment in 3 months.

## 2016-02-18 NOTE — Progress Notes (Signed)
Pre visit review using our clinic review tool, if applicable. No additional management support is needed unless otherwise documented below in the visit note. 

## 2016-02-25 DIAGNOSIS — G4730 Sleep apnea, unspecified: Secondary | ICD-10-CM | POA: Diagnosis not present

## 2016-02-25 DIAGNOSIS — I517 Cardiomegaly: Secondary | ICD-10-CM | POA: Diagnosis not present

## 2016-02-25 DIAGNOSIS — M797 Fibromyalgia: Secondary | ICD-10-CM | POA: Diagnosis not present

## 2016-02-25 DIAGNOSIS — M545 Low back pain: Secondary | ICD-10-CM | POA: Diagnosis not present

## 2016-03-05 ENCOUNTER — Other Ambulatory Visit: Payer: Self-pay | Admitting: Endocrinology

## 2016-03-06 ENCOUNTER — Telehealth: Payer: Self-pay | Admitting: Endocrinology

## 2016-03-06 NOTE — Telephone Encounter (Signed)
Pt needs [place] to call into [facility] the insulin

## 2016-03-06 NOTE — Telephone Encounter (Signed)
Refill for Novolin N submitted.

## 2016-03-11 ENCOUNTER — Other Ambulatory Visit: Payer: Self-pay | Admitting: Endocrinology

## 2016-03-12 DIAGNOSIS — M797 Fibromyalgia: Secondary | ICD-10-CM | POA: Diagnosis not present

## 2016-03-12 DIAGNOSIS — M545 Low back pain: Secondary | ICD-10-CM | POA: Diagnosis not present

## 2016-03-12 DIAGNOSIS — I10 Essential (primary) hypertension: Secondary | ICD-10-CM | POA: Diagnosis not present

## 2016-03-17 DIAGNOSIS — M545 Low back pain: Secondary | ICD-10-CM | POA: Diagnosis not present

## 2016-03-19 DIAGNOSIS — M545 Low back pain: Secondary | ICD-10-CM | POA: Diagnosis not present

## 2016-03-21 ENCOUNTER — Other Ambulatory Visit: Payer: Self-pay | Admitting: Internal Medicine

## 2016-03-23 ENCOUNTER — Encounter: Payer: Self-pay | Admitting: Acute Care

## 2016-03-23 ENCOUNTER — Ambulatory Visit (INDEPENDENT_AMBULATORY_CARE_PROVIDER_SITE_OTHER): Payer: PPO | Admitting: Acute Care

## 2016-03-23 DIAGNOSIS — G4733 Obstructive sleep apnea (adult) (pediatric): Secondary | ICD-10-CM | POA: Diagnosis not present

## 2016-03-23 DIAGNOSIS — J208 Acute bronchitis due to other specified organisms: Secondary | ICD-10-CM

## 2016-03-23 MED ORDER — PREDNISONE 10 MG PO TABS: 10.0000 mg | ORAL_TABLET | Freq: Every day | ORAL | 0 refills | Status: DC

## 2016-03-23 MED ORDER — DOXYCYCLINE HYCLATE 100 MG PO TABS: 100.0000 mg | ORAL_TABLET | Freq: Two times a day (BID) | ORAL | 0 refills | Status: DC

## 2016-03-23 MED ORDER — HYDROCODONE-HOMATROPINE 5-1.5 MG/5ML PO SYRP: 5.0000 mL | ORAL_SOLUTION | Freq: Four times a day (QID) | ORAL | 0 refills | Status: DC | PRN

## 2016-03-23 NOTE — Assessment & Plan Note (Signed)
Acute Flare: Plan: Doxycycline 100mg  Twice daily  For  7 days -use sunscreen if in the sun.  Eat yogurt daily .( Activia with Probiotic)  Prednisone taper over next week.  Prednisone taper; 10 mg tablets: 4 tabs x 2 days, 3 tabs x 2 days, 2 tabs x 2 days 1 tab x 2 days then stop. Continue fluticasone nasal spray, 2 sprays is not twice a day Continue Singulair 10 mg each evening Continue on Omeprazole to 40 mg  daily  Continue on nasal saline washes  Take Hydromet 5 mL up to every 6 hours if needed for cough Do not drive if sleepy. Follow up with Dr. Lamonte Sakai in 6 weeks or before as needed. Please contact office for sooner follow up if symptoms do not improve or worsen or seek emergency care

## 2016-03-23 NOTE — Progress Notes (Signed)
History of Present Illness Christy May is a 65 y.o. female with Acute allergic bronchitis, OSA and childhood history of asthma followed in the office by Dr. Lamonte Sakai.  HPI 65 yo woman, former smoker with obesity, OSA, HTN on irbesartan, DM, childhood asthma. Carries a dx of adult asthma made several years ago. Referred for cough. She describes cough that has been prod of white phlegm, started after URI sx about 4 months ago. Describes a tickle in her throat. Can result in stress incontinence.   03/23/2016 Acute OV: Pt. Presents with cough and what she calls bronchitis starting approximately  about 4 days ago. She is coughing up white thick white secretions, and complains of chest tightness with occasional wheezing.She states that she does not know if she has a fever , but has had some hot flashes with the other symptoms. She denies nausea or vomiting. She is compliant with her Singulair, Flonase. Claritin and saline rinses.She is having a difficult time sleeping because of her cough.She denies chest pain, orthopnea, calf pain , edema  or hemoptysis.She states that she is compliant with her CPAP use.   Past medical hx Past Medical History:  Diagnosis Date  . Allergic bronchitis   . Allergy   . Anemia, unspecified   . Anxiety   . Arthritis    "neck, back, hands" (09/03/2014)  . Chronic airway obstruction, not elsewhere classified    "I was told I don't have this/tests done @ [facility] 05/2014"  . Chronic back pain   . Complication of anesthesia    "I've had recall; I probably stopped breathing at least 2 times" (09/03/2014)  . Dehydration   . Depressive disorder, not elsewhere classified   . Diabetic peripheral neuropathy ([place])   . Dysphagia 2007   historyof dysphagia with severe dysmotility by barium swallow-[name]  . Esophageal reflux   . Extrinsic asthma, unspecified    "I was told I don't have this/tests done @ [facility] 05/2014"  . Family history of adverse reaction to  anesthesia    "my brother was told they lost him a couple times during OR"  . Headache    "weekly to monthly" (09/03/2014)  . Heart murmur   . History of hiatal hernia   . Hypertension   . Inflammatory and toxic neuropathy, unspecified   . Migraine    "couple times/month right now" (09/03/2014)  . Obesity   . Obesity, unspecified   . OSA (obstructive sleep apnea)    "stopped using my CPAP; just couldn't use it cause it kept me awake and I couldn't keep it on my face'"(09/03/2014)  . Spondylosis of unspecified site without mention of myelopathy   . Type II diabetes mellitus ([place])   . Unspecified essential hypertension   . Unspecified menopausal and postmenopausal disorder   . Unspecified vitamin D deficiency      Past surgical hx, Family hx, Social hx all reviewed.  Current Outpatient Prescriptions on File Prior to Visit  Medication Sig  . albuterol (PROVENTIL) (2.5 MG/3ML) 0.083% nebulizer solution Take 2.5 mg by nebulization every 6 (six) hours as needed for wheezing or shortness of breath.  . allopurinol (ZYLOPRIM) 300 MG tablet Take 300 mg by mouth daily.  Marland Kitchen aspirin EC 81 MG tablet Take 81 mg by mouth at bedtime.  . chlorpheniramine (CHLORPHEN) 4 MG tablet Take 4 mg by mouth daily.  . cholecalciferol (VITAMIN D) 1000 UNITS tablet Take 2,000 Units by mouth daily.   . famotidine (PEPCID) 20 MG tablet  Take 20 mg by mouth at bedtime.  . folic acid (FOLVITE) 1 MG tablet TAKE 4 TABLETS(4 MG) BY MOUTH DAILY  . gabapentin (NEURONTIN) 800 MG tablet Take 1 tablet (800 mg total) by mouth 3 (three) times daily.  . irbesartan (AVAPRO) 300 MG tablet Take 300 mg by mouth daily.  Marland Kitchen loratadine (CLARITIN) 10 MG tablet Take 10 mg by mouth daily.  . metoprolol (LOPRESSOR) 50 MG tablet Take 75 mg by mouth 2 (two) times daily.  . montelukast (SINGULAIR) 10 MG tablet Take 10 mg by mouth at bedtime.  Marland Kitchen NOVOLIN N RELION 100 UNIT/ML injection INJECT 190 UNITS EVERY MORNING (Patient taking differently:  INJECT [place])  . nystatin cream (MYCOSTATIN) Apply 1 application topically 2 (two) times daily as needed for dry skin.  . Omega-3 Fatty Acids (FISH OIL) 1200 MG CPDR Take 1,200 mg by mouth daily.  Marland Kitchen omeprazole (PRILOSEC) 40 MG capsule Take 40 mg by mouth daily.  Marland Kitchen oxybutynin (DITROPAN-XL) 10 MG 24 hr tablet Take 1 tablet (10 mg total) by mouth daily.  . fluticasone (FLONASE) 50 MCG/ACT nasal spray Place 2 sprays into both nostrils daily.  Marland Kitchen HYDROcodone-acetaminophen (NORCO) 7.5-325 MG tablet Take 1 tablet by mouth 3 (three) times daily as needed.  . sodium chloride (OCEAN) 0.65 % SOLN nasal spray Place 2 sprays into both nostrils 2 (two) times daily.   No current facility-administered medications on file prior to visit.      Allergies  Allergen Reactions  . Ace Inhibitors Anaphylaxis and Cough  . Penicillins Hives and Other (See Comments)    Has patient had a PCN reaction causing immediate rash, facial/tongue/throat swelling, SOB or lightheadedness with hypotension:  No Has patient had a PCN reaction causing severe rash involving mucus membranes or skin necrosis:  No Has patient had a PCN reaction that required hospitalization No Has patient had a PCN reaction occurring within the last 10 years: No If all of the above answers are "NO", then may proceed with Cephalosporin use.  . Adhesive [Tape] Hives    Review Of Systems:  Constitutional:   No  weight loss, night sweats,  Fevers, chills, fatigue, or  lassitude.  HEENT:   + headaches,  Difficulty swallowing,  Tooth/dental problems, or  Sore throat,                No sneezing, itching, ear ache, +nasal congestion, post nasal drip,   CV:  No chest pain,  Orthopnea, PND, swelling in lower extremities, anasarca, dizziness, palpitations, syncope.   GI  No heartburn, indigestion, abdominal pain, nausea, vomiting, diarrhea, change in bowel habits, loss of appetite, bloody stools.   Resp: + shortness of breath with  exertion not  at rest.  + excess mucus, + productive cough,  No non-productive cough,  No coughing up of blood.  No change in color of mucus.  + wheezing.  No chest wall deformity  Skin: no rash or lesions.  GU: no dysuria, change in color of urine, no urgency or frequency.  No flank pain, no hematuria   MS:  No joint pain or swelling.  No decreased range of motion.  No back pain.  Psych:  No change in mood or affect. No depression or anxiety.  No memory loss.   Vital Signs BP 122/82 (BP Location: Left Arm, Cuff Size: Normal)   Pulse 73   Temp 97.7 F (36.5 C) (Oral)   Ht 5\' 4"  (1.626 m)   Wt (!) 314 lb  3.2 oz (142.5 kg)   SpO2 97%   BMI 53.93 kg/m    Physical Exam:  General- No distress,  A&Ox3, obese, pleasant ENT: No sinus tenderness, TM clear, pale nasal mucosa, no oral exudate,no post nasal drip, no LAN Cardiac: S1, S2, regular rate and rhythm, no murmur Chest: + Exp  wheeze/ no rales/ dullness; no accessory muscle use, no nasal flaring, no sternal retractions Abd.: Soft Non-tender Ext: No clubbing cyanosis, edema Neuro:  normal strength Skin: No rashes, warm and dry Psych: normal mood and behavior   Assessment/Plan  Acute bronchitis Acute Flare: Plan: Doxycycline 100mg  Twice daily  For  7 days -use sunscreen if in the sun.  Eat yogurt daily .( Activia with Probiotic)  Prednisone taper over next week.  Prednisone taper; 10 mg tablets: 4 tabs x 2 days, 3 tabs x 2 days, 2 tabs x 2 days 1 tab x 2 days then stop. Continue fluticasone nasal spray, 2 sprays is not twice a day Continue Singulair 10 mg each evening Continue on Omeprazole to 40 mg  daily  Continue on nasal saline washes  Take Hydromet 5 mL up to every 6 hours if needed for cough Do not drive if sleepy. Follow up with Dr. Lamonte Sakai in 6 weeks or before as needed. Please contact office for sooner follow up if symptoms do not improve or worsen or seek emergency care    Obstructive sleep apnea Pt. States  she is compliant with CPAP use. Plan: Continue on CPAP at bedtime. You appear to be benefiting from the treatment Goal is to wear for at least 4-6 hours each night for maximal clinical benefit. Continue to work on weight loss, as the link between excess weight  and sleep apnea is well established.  Do not drive if sleepy. Follow up with Dr. Lamonte Sakai  In 6 weeks or before as needed.     Magdalen Spatz, NP 03/23/2016  12:51 PM

## 2016-03-23 NOTE — Assessment & Plan Note (Signed)
Pt. States she is compliant with CPAP use. Plan: Continue on CPAP at bedtime. You appear to be benefiting from the treatment Goal is to wear for at least 4-6 hours each night for maximal clinical benefit. Continue to work on weight loss, as the link between excess weight  and sleep apnea is well established.  Do not drive if sleepy. Follow up with Dr. Lamonte Sakai  In 6 weeks or before as needed.

## 2016-03-23 NOTE — Patient Instructions (Addendum)
It is nice to see you today Doxycycline 100mg  Twice daily  For  7 days -use sunscreen.  Eat yogurt daily .( Activia with Probiotic)  Prednisone taper over next week.  Prednisone taper; 10 mg tablets: 4 tabs x 2 days, 3 tabs x 2 days, 2 tabs x 2 days 1 tab x 2 days then stop. Continue fluticasone nasal spray, 2 sprays is not twice a day Continue Singulair 10 mg each evening Continue on Omeprazole to 40 mg  daily  Continue on nasal saline washes  Take Hydromet 5 mL up to every 6 hours if needed for cough Do not drive if sleepy. Sugar Free Exline for throat soothing. Sips of water instead of throat clearing Aviod mint and menthol Please contact office for sooner follow up if symptoms do not improve or worsen or seek emergency care  Follow up with Dr. Lamonte Sakai  In 6 weeks  and As needed   Please contact office for sooner follow up if symptoms do not improve or worsen or seek emergency care

## 2016-03-24 DIAGNOSIS — M545 Low back pain: Secondary | ICD-10-CM | POA: Diagnosis not present

## 2016-03-26 DIAGNOSIS — M545 Low back pain: Secondary | ICD-10-CM | POA: Diagnosis not present

## 2016-03-31 DIAGNOSIS — M545 Low back pain: Secondary | ICD-10-CM | POA: Diagnosis not present

## 2016-04-01 DIAGNOSIS — L918 Other hypertrophic disorders of the skin: Secondary | ICD-10-CM | POA: Diagnosis not present

## 2016-04-03 ENCOUNTER — Other Ambulatory Visit: Payer: Self-pay | Admitting: Acute Care

## 2016-04-03 ENCOUNTER — Telehealth: Payer: Self-pay | Admitting: Emergency Medicine

## 2016-04-03 MED ORDER — HYDROCODONE-HOMATROPINE 5-1.5 MG/5ML PO SYRP: 5.0000 mL | ORAL_SOLUTION | Freq: Four times a day (QID) | ORAL | 0 refills | Status: DC | PRN

## 2016-04-03 NOTE — Telephone Encounter (Signed)
Spoke with patient-she would like to have Rx refill Hycodan. RB Please advise.

## 2016-04-03 NOTE — Telephone Encounter (Signed)
Yes this is OK to refill

## 2016-04-03 NOTE — Telephone Encounter (Signed)
Rx printed, signed by RB, at front for pickup. Pt is aware.

## 2016-04-07 DIAGNOSIS — H25813 Combined forms of age-related cataract, bilateral: Secondary | ICD-10-CM | POA: Diagnosis not present

## 2016-04-07 DIAGNOSIS — E113293 Type 2 diabetes mellitus with mild nonproliferative diabetic retinopathy without macular edema, bilateral: Secondary | ICD-10-CM | POA: Diagnosis not present

## 2016-04-08 ENCOUNTER — Other Ambulatory Visit: Payer: Self-pay | Admitting: Endocrinology

## 2016-04-14 ENCOUNTER — Telehealth: Payer: Self-pay | Admitting: Emergency Medicine

## 2016-04-14 NOTE — Telephone Encounter (Signed)
[employer]- [employer]

## 2016-04-15 ENCOUNTER — Ambulatory Visit (INDEPENDENT_AMBULATORY_CARE_PROVIDER_SITE_OTHER): Payer: PPO | Admitting: Adult Health

## 2016-04-15 ENCOUNTER — Encounter: Payer: Self-pay | Admitting: Adult Health

## 2016-04-15 DIAGNOSIS — M2550 Pain in unspecified joint: Secondary | ICD-10-CM | POA: Diagnosis not present

## 2016-04-15 DIAGNOSIS — M79641 Pain in right hand: Secondary | ICD-10-CM | POA: Diagnosis not present

## 2016-04-15 DIAGNOSIS — M25474 Effusion, right foot: Secondary | ICD-10-CM | POA: Diagnosis not present

## 2016-04-15 DIAGNOSIS — J209 Acute bronchitis, unspecified: Secondary | ICD-10-CM

## 2016-04-15 DIAGNOSIS — F5104 Psychophysiologic insomnia: Secondary | ICD-10-CM | POA: Diagnosis not present

## 2016-04-15 DIAGNOSIS — G5601 Carpal tunnel syndrome, right upper limb: Secondary | ICD-10-CM | POA: Diagnosis not present

## 2016-04-15 DIAGNOSIS — G5602 Carpal tunnel syndrome, left upper limb: Secondary | ICD-10-CM | POA: Diagnosis not present

## 2016-04-15 DIAGNOSIS — M79642 Pain in left hand: Secondary | ICD-10-CM | POA: Diagnosis not present

## 2016-04-15 MED ORDER — PREDNISONE 10 MG PO TABS
ORAL_TABLET | ORAL | 0 refills | Status: DC
Start: 2016-04-15 — End: 2016-05-01

## 2016-04-15 MED ORDER — HYDROCODONE-HOMATROPINE 5-1.5 MG/5ML PO SYRP: 5.0000 mL | ORAL_SOLUTION | Freq: Four times a day (QID) | ORAL | 0 refills | Status: DC | PRN

## 2016-04-15 MED ORDER — AZITHROMYCIN 250 MG PO TABS: ORAL_TABLET | ORAL | 0 refills | Status: AC

## 2016-04-15 NOTE — Telephone Encounter (Signed)
Pt returned called.Contact # [phone].Christy May

## 2016-04-15 NOTE — Progress Notes (Signed)
Subjective:    Patient ID: Christy May, female    DOB: January 11, 1951, 65 y.o.   MRN: [id number]  HPI 65 yo female former smoker with OSA on CPAP , VCD  /Chronic cough    04/15/2016 Acute OV:  Pt presents for an acute office visit.  She complains of  2 weeks of  prod cough with thick mucus, nasal drainage  chest congestion/tightness, wheezing and SOB . Marland Kitchen Denies any fever, nausea or vomiting, hemoptysis or chest pain.  Remains Singulair, Flonase Claritin , and Saline nasal .  Was seen 2 weeks ago , with similar sx . Given doxycycline but only helped her a little bit.  She wants refill of hydromet, we discussed potential side effects of this medication and advised to use with caution.     Past Medical History:  Diagnosis Date  . Allergic bronchitis   . Allergy   . Anemia, unspecified   . Anxiety   . Arthritis    "neck, back, hands" (09/03/2014)  . Chronic airway obstruction, not elsewhere classified    "I was told I don't have this/tests done @ [facility] 05/2014"  . Chronic back pain   . Complication of anesthesia    "I've had recall; I probably stopped breathing at least 2 times" (09/03/2014)  . Dehydration   . Depressive disorder, not elsewhere classified   . Diabetic peripheral neuropathy ([place])   . Dysphagia 2007   historyof dysphagia with severe dysmotility by barium swallow-[name]  . Esophageal reflux   . Extrinsic asthma, unspecified    "I was told I don't have this/tests done @ [facility] 05/2014"  . Family history of adverse reaction to anesthesia    "my brother was told they lost him a couple times during OR"  . Headache    "weekly to monthly" (09/03/2014)  . Heart murmur   . History of hiatal hernia   . Hypertension   . Inflammatory and toxic neuropathy, unspecified   . Migraine    "couple times/month right now" (09/03/2014)  . Obesity   . Obesity, unspecified   . OSA (obstructive sleep apnea)    "stopped using my CPAP; just couldn't use it cause it kept me  awake and I couldn't keep it on my face'"(09/03/2014)  . Spondylosis of unspecified site without mention of myelopathy   . Type II diabetes mellitus ([place])   . Unspecified essential hypertension   . Unspecified menopausal and postmenopausal disorder   . Unspecified vitamin D deficiency    Current Outpatient Prescriptions on File Prior to Visit  Medication Sig Dispense Refill  . albuterol (PROVENTIL) (2.5 MG/3ML) 0.083% nebulizer solution Take 2.5 mg by nebulization every 6 (six) hours as needed for wheezing or shortness of breath.    . allopurinol (ZYLOPRIM) 300 MG tablet Take 300 mg by mouth daily.    Marland Kitchen aspirin EC 81 MG tablet Take 81 mg by mouth at bedtime.    . chlorpheniramine (CHLORPHEN) 4 MG tablet Take 4 mg by mouth daily.    . cholecalciferol (VITAMIN D) 1000 UNITS tablet Take 2,000 Units by mouth daily.     . famotidine (PEPCID) 20 MG tablet Take 20 mg by mouth at bedtime.    . fluticasone (FLONASE) 50 MCG/ACT nasal spray Place 2 sprays into both nostrils daily.    . folic acid (FOLVITE) 1 MG tablet TAKE 4 TABLETS(4 MG) BY MOUTH DAILY 120 tablet 0  . gabapentin (NEURONTIN) 800 MG tablet Take 1 tablet (800 mg total) by  mouth 3 (three) times daily. 270 tablet 1  . HYDROcodone-acetaminophen (NORCO) 7.5-325 MG tablet Take 1 tablet by mouth 3 (three) times daily as needed.    . irbesartan (AVAPRO) 300 MG tablet Take 300 mg by mouth daily.    Marland Kitchen loratadine (CLARITIN) 10 MG tablet Take 10 mg by mouth daily.    . metoprolol (LOPRESSOR) 50 MG tablet Take 75 mg by mouth 2 (two) times daily.    . montelukast (SINGULAIR) 10 MG tablet TAKE 1 TABLET(10 MG) BY MOUTH DAILY 90 tablet 1  . NOVOLIN N RELION 100 UNIT/ML injection INJECT 190 UNITS EVERY MORNING (Patient taking differently: INJECT 160 UNITS EVERY MORNING) 70 mL 11  . nystatin cream (MYCOSTATIN) Apply 1 application topically 2 (two) times daily as needed for dry skin.    . Omega-3 Fatty Acids (FISH OIL) 1200 MG CPDR Take 1,200 mg by mouth  daily.    Marland Kitchen omeprazole (PRILOSEC) 40 MG capsule Take 40 mg by mouth daily.    Marland Kitchen oxybutynin (DITROPAN-XL) 10 MG 24 hr tablet Take 1 tablet (10 mg total) by mouth daily. 90 tablet 1  . sodium chloride (OCEAN) 0.65 % SOLN nasal spray Place 2 sprays into both nostrils 2 (two) times daily.    Marland Kitchen HYDROcodone-homatropine (HYCODAN) 5-1.5 MG/5ML syrup Take 5 mLs by mouth every 6 (six) hours as needed for cough. (Patient not taking: Reported on 04/15/2016) 180 mL 0   No current facility-administered medications on file prior to visit.       Review of Systems Constitutional:   No  weight loss, night sweats,  Fevers, chills, + fatigue, or  lassitude.  HEENT:   No headaches,  Difficulty swallowing,  Tooth/dental problems, or  Sore throat,                No sneezing, itching, ear ache,  +nasal congestion, post nasal drip,   CV:  No chest pain,  Orthopnea, PND, swelling in lower extremities, anasarca, dizziness, palpitations, syncope.   GI  No heartburn, indigestion, abdominal pain, nausea, vomiting, diarrhea, change in bowel habits, loss of appetite, bloody stools.   Resp:  .  No chest wall deformity  Skin: no rash or lesions.  GU: no dysuria, change in color of urine, no urgency or frequency.  No flank pain, no hematuria   MS:  No joint pain or swelling.  No decreased range of motion.  No back pain.  Psych:  No change in mood or affect. No depression or anxiety.  No memory loss.         Objective:   Physical Exam Vitals:   04/15/16 1121  BP: 122/72  Pulse: 73  SpO2: 96%  Weight: (!) 306 lb 12.8 oz (139.2 kg)  Height: 5' 4.25" (1.632 m)    Body mass index is 52.25 kg/m.   GEN: A/Ox3; pleasant , NAD, morbidly obese    HEENT:  [place]/AT,  EACs-clear, TMs-wnl, NOSE-clear, THROAT-clear, no lesions, no postnasal drip or exudate noted.   NECK:  Supple w/ fair ROM; no JVD; normal carotid impulses w/o bruits; no thyromegaly or nodules palpated; no lymphadenopathy.    RESP  Decreased BS  in bases , no accessory muscle use, no dullness to percussion  CARD:  RRR, no m/r/g  , no peripheral edema, pulses intact, no cyanosis or clubbing.  GI:   Soft & nt; nml bowel sounds; no organomegaly or masses detected.   Musco: Warm bil, no deformities or joint swelling noted.   Neuro: alert, no  focal deficits noted.    Skin: Warm, no lesions or rashes  Tammy Parrett NP-C  [facility]  04/15/2016       Assessment & Plan:

## 2016-04-15 NOTE — Telephone Encounter (Signed)
lmtcb x1 for pt. She has an appointment with TP today at 11:30am. This can be addressed at this appointment.

## 2016-04-15 NOTE — Telephone Encounter (Signed)
Called pt and attempted to speak to her. She answered the phone but I could not hear her over her television. I attempted to keep calling her name but she did not respond. Will try her back later this morning.

## 2016-04-15 NOTE — Patient Instructions (Addendum)
Zpack take as directed.  Prednisone taper over next week.  Mucinex DM Twice daily  As needed  Cough/congestion  Take Hydromet 5 mL up to every 6 hours if needed for cough-,may make you sleepy  Saline nasal rinses As needed   Please contact office for sooner follow up if symptoms do not improve or worsen or seek emergency care  Follow up Dr. Lamonte Sakai  In 3 months and As needed

## 2016-04-16 NOTE — Assessment & Plan Note (Signed)
Slow to resolve flare -if not improving will need cxr  1 x refill on hydromet . Advised narcotic and sedation effects.   Plan  Patient Instructions  Zpack take as directed.  Prednisone taper over next week.  Mucinex DM Twice daily  As needed  Cough/congestion  Take Hydromet 5 mL up to every 6 hours if needed for cough-,may make you sleepy  Saline nasal rinses As needed   Please contact office for sooner follow up if symptoms do not improve or worsen or seek emergency care  Follow up Dr. Lamonte Sakai  In 3 months and As needed

## 2016-04-17 NOTE — Telephone Encounter (Signed)
Will close this message since the pt was seen by TP on 10/25.

## 2016-04-24 DIAGNOSIS — L918 Other hypertrophic disorders of the skin: Secondary | ICD-10-CM | POA: Diagnosis not present

## 2016-04-24 DIAGNOSIS — L821 Other seborrheic keratosis: Secondary | ICD-10-CM | POA: Diagnosis not present

## 2016-04-27 ENCOUNTER — Telehealth: Payer: Self-pay | Admitting: Endocrinology

## 2016-04-27 IMAGING — DX DG CHEST 2V
2 series · 3 of 3 positions shown · non-contrast
Comparison: 05/23/2014

CLINICAL DATA: Productive cough for 6 days with shortness of
breath.

EXAM:
CHEST  2 VIEW

[Series 1: chest lat · 0.14mm/px · 2 of 2 slices shown]
[im 1/2]
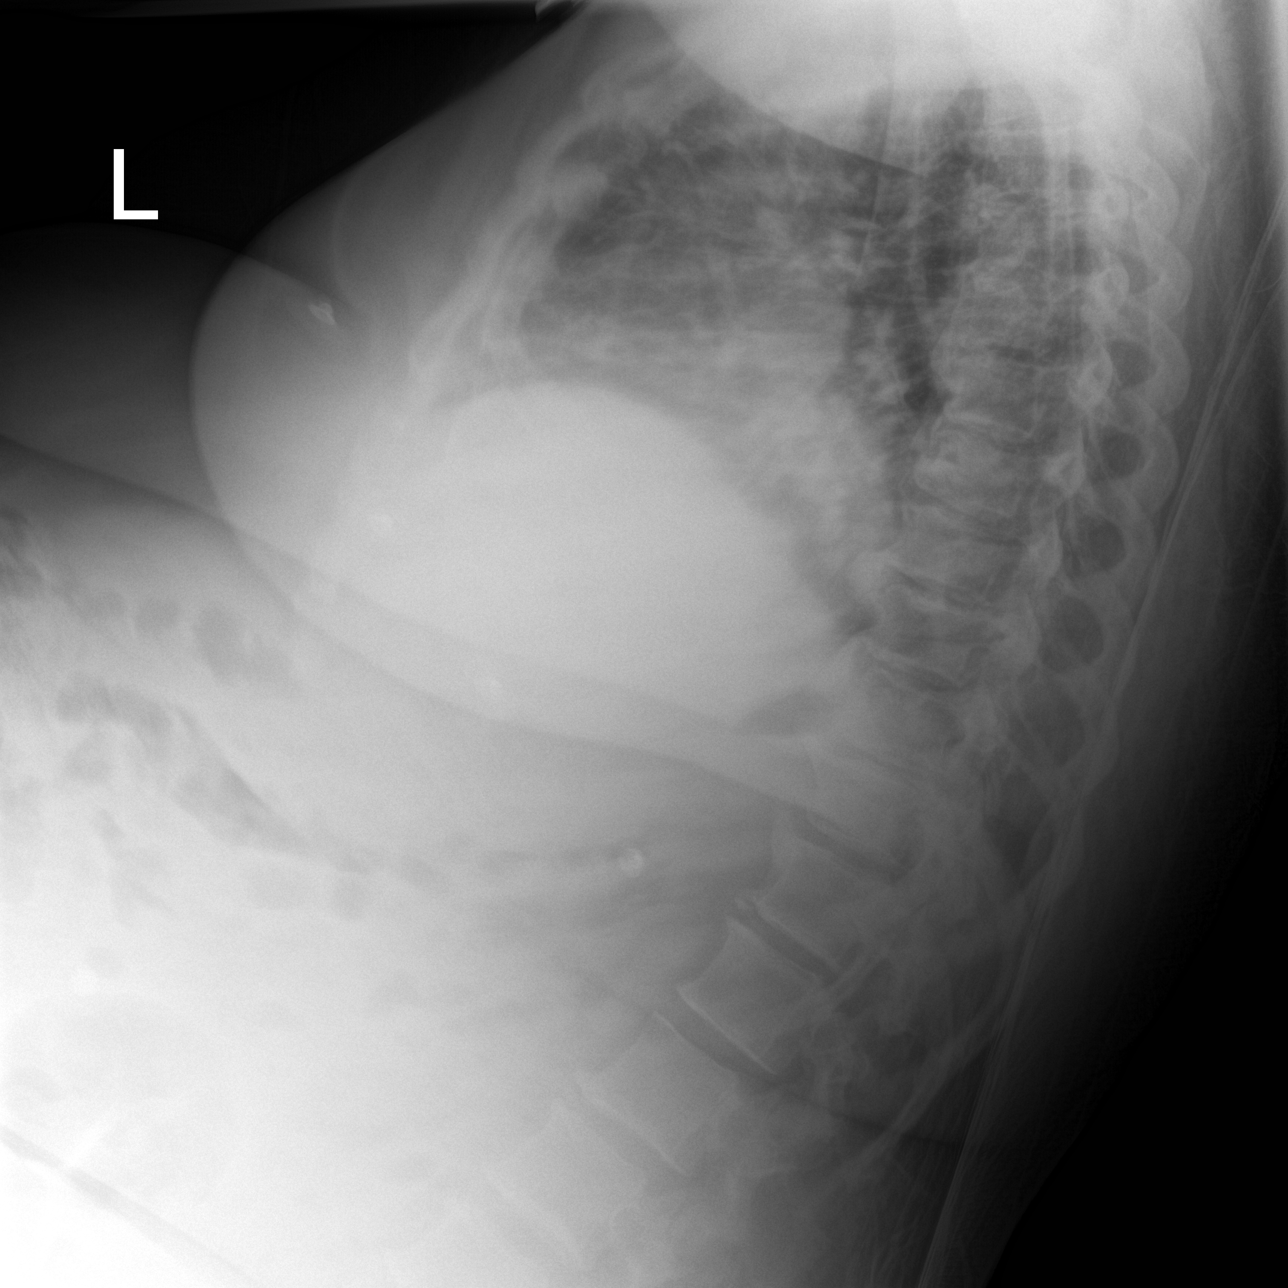
[im 2/2]
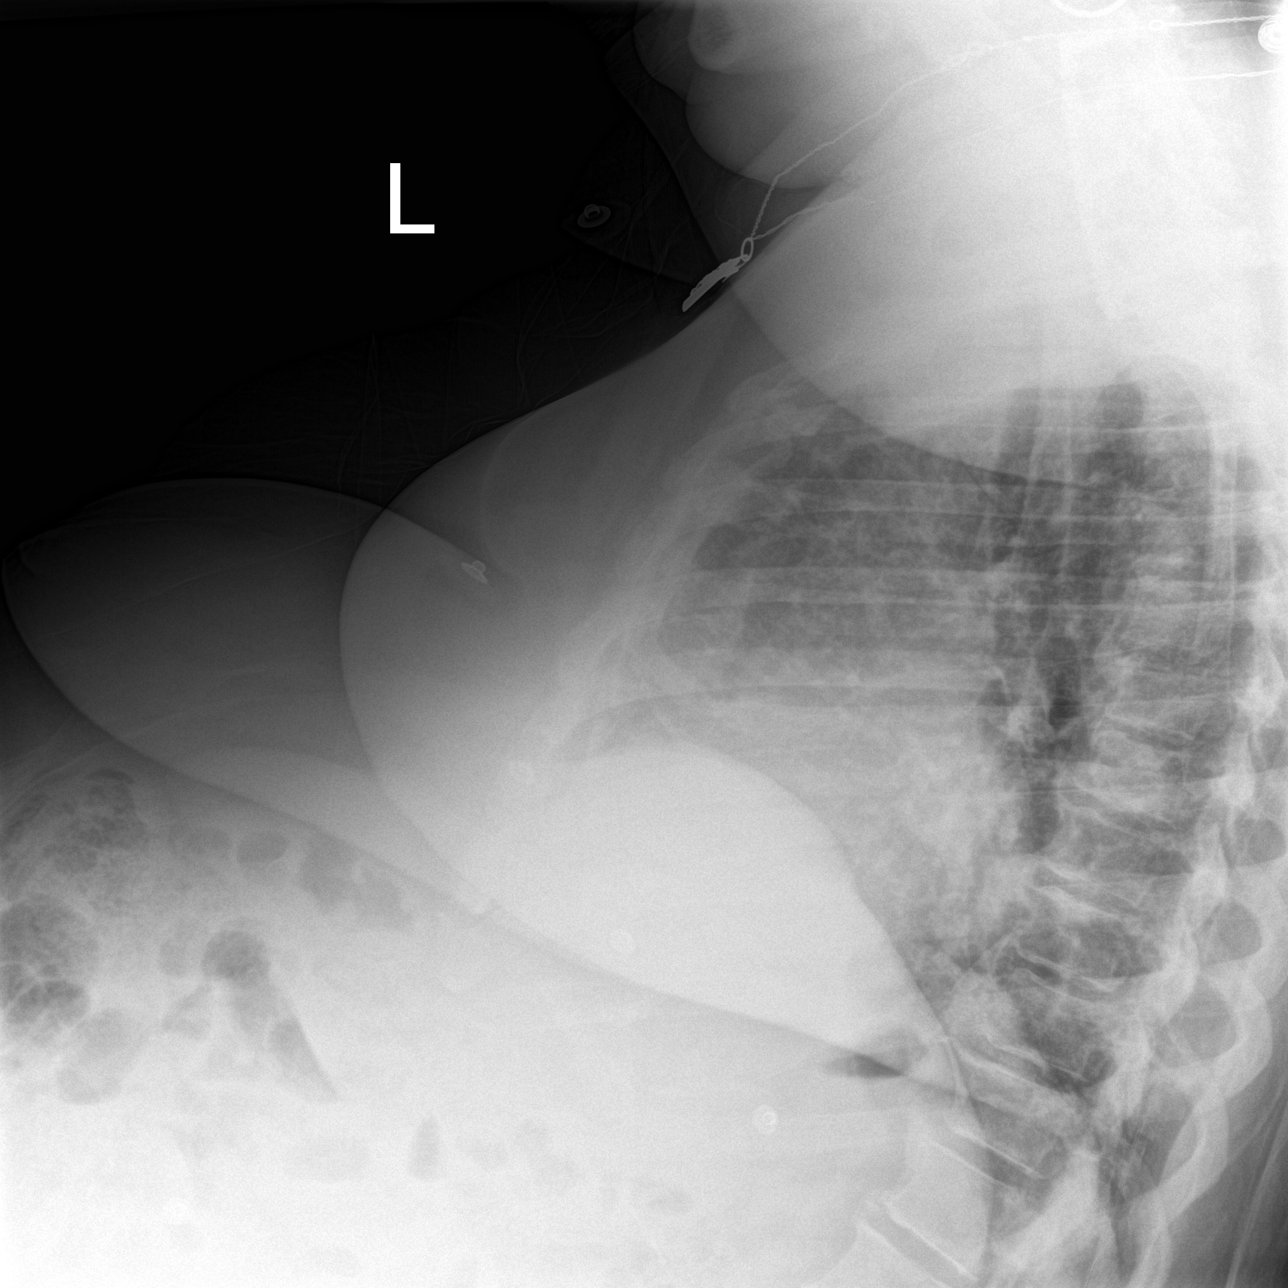

[chest ap]
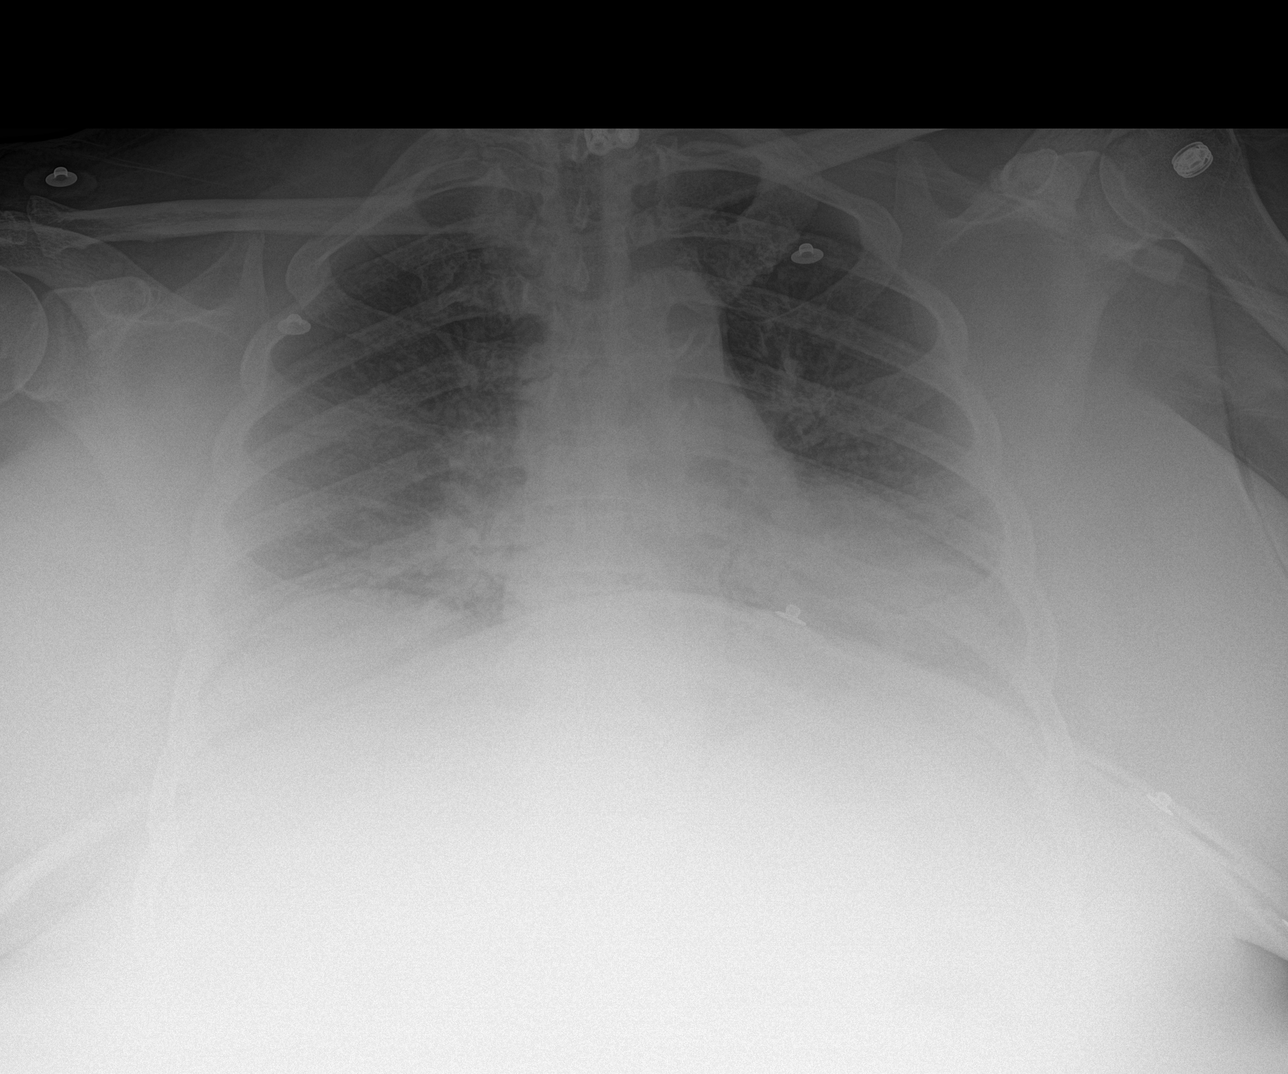

[3 of 3 positions shown; findings below may reference images not displayed]

FINDINGS: The study is limited due to patient's body habitus. Specifically,
the AP view is very difficult to interpret due to the overlying soft
tissues. Difficult to exclude enlargement of the cardiac silhouette.
There are low lung volumes. No evidence for pleural effusions on the
lateral view. Prominent central vascular structures and cannot
exclude mild edema. Surgical plate in the cervical spine.
IMPRESSION: Limited examination due to body habitus.

Prominent central vascular structures and cannot exclude mild edema.

## 2016-04-27 NOTE — Telephone Encounter (Signed)
Please verify insulin is 130 units qam Then reduce to 120 units qam Needs f/u ov < 2 weeks.

## 2016-04-27 NOTE — Telephone Encounter (Signed)
Ok, then please reduce to 150 units QAM

## 2016-04-27 NOTE — Telephone Encounter (Signed)
I contacted the patient and advised of message. Patient voiced understand and appointment has been scheduled 05/01/2016.

## 2016-04-27 NOTE — Telephone Encounter (Signed)
See message and please advise, Thanks!  

## 2016-04-27 NOTE — Telephone Encounter (Signed)
I contacted the patient and she advised me she is currently taking 160 units daily. Please advise. Thanks!

## 2016-04-27 NOTE — Telephone Encounter (Signed)
Patient stated that her b/s keeps dropping today it is 47  Please advise on what to do.

## 2016-04-30 ENCOUNTER — Other Ambulatory Visit: Payer: Self-pay | Admitting: Emergency Medicine

## 2016-05-01 ENCOUNTER — Encounter: Payer: Self-pay | Admitting: Endocrinology

## 2016-05-01 ENCOUNTER — Ambulatory Visit (INDEPENDENT_AMBULATORY_CARE_PROVIDER_SITE_OTHER): Payer: PPO | Admitting: Endocrinology

## 2016-05-01 VITALS — BP 130/78 | HR 73 | Wt 305.8 lb

## 2016-05-01 DIAGNOSIS — I110 Hypertensive heart disease with heart failure: Secondary | ICD-10-CM | POA: Diagnosis not present

## 2016-05-01 DIAGNOSIS — E138 Other specified diabetes mellitus with unspecified complications: Secondary | ICD-10-CM | POA: Diagnosis not present

## 2016-05-01 DIAGNOSIS — J209 Acute bronchitis, unspecified: Secondary | ICD-10-CM | POA: Diagnosis not present

## 2016-05-01 DIAGNOSIS — F338 Other recurrent depressive disorders: Secondary | ICD-10-CM | POA: Diagnosis not present

## 2016-05-01 DIAGNOSIS — M797 Fibromyalgia: Secondary | ICD-10-CM | POA: Diagnosis not present

## 2016-05-01 LAB — POCT GLYCOSYLATED HEMOGLOBIN (HGB A1C): Hemoglobin A1C: 7.2

## 2016-05-01 MED ORDER — INSULIN NPH (HUMAN) (ISOPHANE) 100 UNIT/ML ~~LOC~~ SUSP: 150.0000 [IU] | SUBCUTANEOUS | 11 refills | Status: DC

## 2016-05-01 NOTE — Progress Notes (Signed)
Subjective:    Patient ID: Christy May, female    DOB: 11-08-1950, 65 y.o.   MRN: [id number]  HPI Pt returns for f/u of diabetes mellitus: DM type: Insulin-requiring type 2 Dx'ed: [id number] Complications: polyneuropathy and renal insufficiency.  Therapy: insulin since 2002.   GDM: never DKA: never Severe hypoglycemia: never.  Pancreatitis: never.  Other: she was changed to a qd insulin regimen, after poor results on multiple daily injections; she wants the cheapest possible insulin.  She declines weight loss surgery.   Interval history: she had to reduce the insulin to 150 units qam, due to hypoglycemia.  Since then, cbg's vary from 56-200's.  There is no trend throughout the day. Last steroid injection was 2-3 weeks ago.  She says she never misses the insulin Past Medical History:  Diagnosis Date  . Allergic bronchitis   . Allergy   . Anemia, unspecified   . Anxiety   . Arthritis    "neck, back, hands" (09/03/2014)  . Chronic airway obstruction, not elsewhere classified    "I was told I don't have this/tests done @ [facility] 05/2014"  . Chronic back pain   . Complication of anesthesia    "I've had recall; I probably stopped breathing at least 2 times" (09/03/2014)  . Dehydration   . Depressive disorder, not elsewhere classified   . Diabetic peripheral neuropathy ([place])   . Dysphagia 2007   historyof dysphagia with severe dysmotility by barium swallow-[name]  . Esophageal reflux   . Extrinsic asthma, unspecified    "I was told I don't have this/tests done @ [facility] 05/2014"  . Family history of adverse reaction to anesthesia    "my brother was told they lost him a couple times during OR"  . Headache    "weekly to monthly" (09/03/2014)  . Heart murmur   . History of hiatal hernia   . Hypertension   . Inflammatory and toxic neuropathy, unspecified   . Migraine    "couple times/month right now" (09/03/2014)  . Obesity   . Obesity, unspecified   . OSA (obstructive sleep  apnea)    "stopped using my CPAP; just couldn't use it cause it kept me awake and I couldn't keep it on my face'"(09/03/2014)  . Spondylosis of unspecified site without mention of myelopathy   . Type II diabetes mellitus ([place])   . Unspecified essential hypertension   . Unspecified menopausal and postmenopausal disorder   . Unspecified vitamin D deficiency     Past Surgical History:  Procedure Laterality Date  . ABDOMINAL HYSTERECTOMY     "partial"  . ANTERIOR CERVICAL DECOMP/DISCECTOMY FUSION     multiple cervical spine levels;Dr. Arnoldo Morale  . APPENDECTOMY    . BACK SURGERY    . CHOLECYSTECTOMY OPEN    . COLONOSCOPY N/A 05/08/2014   Procedure: COLONOSCOPY;  Surgeon: [name], MD;  Location: [facility];  Service: Endoscopy;  Laterality: N/A;  . DILATION AND CURETTAGE OF UTERUS    . ESOPHAGOGASTRODUODENOSCOPY N/A 05/08/2014   Procedure: ESOPHAGOGASTRODUODENOSCOPY (EGD);  Surgeon: [name], MD;  Location: [name];  Service: Endoscopy;  Laterality: N/A;  . EXPLORATORY LAPAROTOMY    . EYE SURGERY    . FOOT SURGERY Right X 2   "took blood out of my arms and put platelets in my feet"  . INCISION AND DRAINAGE ABSCESS     Chest  . JOINT REPLACEMENT    . REFRACTIVE SURGERY Bilateral   . TONSILLECTOMY    . TOTAL  KNEE ARTHROPLASTY Left 10/2003  . TOTAL KNEE ARTHROPLASTY Right 01/2004   [name]    Social History   Social History  . Marital status: Widowed    Spouse name: N/A  . Number of children: N/A  . Years of education: N/A   Occupational History  . disabled Disabled   Social History Main Topics  . Smoking status: Former Smoker    Packs/day: 1.00    Years: 20.00    Types: Cigarettes    Quit date: 06/22/1986  . Smokeless tobacco: Never Used  . Alcohol use Yes  . Drug use: No  . Sexual activity: Not on file   Other Topics Concern  . Not on file   Social History Narrative   ** Merged History Encounter **       Patient does not get regular exercise.       Widowed 2004      Disabled    Current Outpatient Prescriptions on File Prior to Visit  Medication Sig Dispense Refill  . albuterol (PROVENTIL) (2.5 MG/3ML) 0.083% nebulizer solution Take 2.5 mg by nebulization every 6 (six) hours as needed for wheezing or shortness of breath.    . allopurinol (ZYLOPRIM) 300 MG tablet Take 300 mg by mouth daily.    Marland Kitchen aspirin EC 81 MG tablet Take 81 mg by mouth at bedtime.    . chlorpheniramine (CHLORPHEN) 4 MG tablet Take 4 mg by mouth daily.    . cholecalciferol (VITAMIN D) 1000 UNITS tablet Take 2,000 Units by mouth daily.     . famotidine (PEPCID) 20 MG tablet Take 20 mg by mouth at bedtime.    . fluticasone (FLONASE) 50 MCG/ACT nasal spray Place 2 sprays into both nostrils daily.    . folic acid (FOLVITE) 1 MG tablet TAKE 4 TABLETS(4 MG) BY MOUTH DAILY 120 tablet 0  . gabapentin (NEURONTIN) 800 MG tablet Take 1 tablet (800 mg total) by mouth 3 (three) times daily. 270 tablet 1  . HYDROcodone-acetaminophen (NORCO) 7.5-325 MG tablet Take 1 tablet by mouth 3 (three) times daily as needed.    . irbesartan (AVAPRO) 300 MG tablet Take 300 mg by mouth daily.    Marland Kitchen loratadine (CLARITIN) 10 MG tablet Take 10 mg by mouth daily.    . metoprolol (LOPRESSOR) 50 MG tablet Take 75 mg by mouth 2 (two) times daily.    . montelukast (SINGULAIR) 10 MG tablet TAKE 1 TABLET(10 MG) BY MOUTH DAILY 90 tablet 1  . nystatin cream (MYCOSTATIN) Apply 1 application topically 2 (two) times daily as needed for dry skin.    . Omega-3 Fatty Acids (FISH OIL) 1200 MG CPDR Take 1,200 mg by mouth daily.    Marland Kitchen omeprazole (PRILOSEC) 40 MG capsule Take 40 mg by mouth daily.    Marland Kitchen omeprazole (PRILOSEC) 40 MG capsule TAKE 1 CAPSULE(40 MG) BY MOUTH DAILY 30 capsule 2  . oxybutynin (DITROPAN-XL) 10 MG 24 hr tablet Take 1 tablet (10 mg total) by mouth daily. 90 tablet 1  . sodium chloride (OCEAN) 0.65 % SOLN nasal spray Place 2 sprays into both nostrils 2 (two) times daily.    . fluticasone  (VERAMYST) 27.5 MCG/SPRAY nasal spray Place 2 sprays into the nose daily. 10 g 2   No current facility-administered medications on file prior to visit.     Allergies  Allergen Reactions  . Ace Inhibitors Anaphylaxis and Cough  . Penicillins Hives and Other (See Comments)    Has patient had a PCN reaction causing  immediate rash, facial/tongue/throat swelling, SOB or lightheadedness with hypotension:  No Has patient had a PCN reaction causing severe rash involving mucus membranes or skin necrosis:  No Has patient had a PCN reaction that required hospitalization No Has patient had a PCN reaction occurring within the last 10 years: No If all of the above answers are "NO", then may proceed with Cephalosporin use.  . Adhesive [Tape] Hives    Family History  Problem Relation Age of Onset  . Esophageal cancer Brother   . Esophageal cancer Sister     ?  . Colon polyps Brother   . Pancreatic cancer Sister     ?  . Diabetes Sister   . Diabetes      Aunt and Uncle  . Heart disease Maternal Grandfather     BP 130/78   Pulse 73   Wt (!) 305 lb 12.8 oz (138.7 kg)   SpO2 93%   BMI 52.08 kg/m   Review of Systems Denies LOC.    Objective:   Physical Exam VITAL SIGNS:  See vs page GENERAL: no distress Pulses: dorsalis pedis intact bilat.   MSK: no deformity of the feet CV: 1+ bilat leg edema Skin:  no ulcer on the feet.  normal color and temp on the feet. Neuro: sensation is intact to touch on the feet   A1c=7.2%    Assessment & Plan:  Insulin-requiring type 2 DM, with renal insufficiency.  this is the best control this pt should aim for, given this regimen, which does match insulin to her changing needs throughout the day   Patient is advised the following: Patient Instructions  Please continue the same insulin (150 units each morning) On this type of insulin schedule, you should eat meals on a regular schedule.  If a meal is missed or significantly delayed, your blood  sugar could go low.   check your blood sugar twice a day.  vary the time of day when you check, between before the 3 meals, and at bedtime.  also check if you have symptoms of your blood sugar being too high or too low.  please keep a record of the readings and bring it to your next appointment here.  You can write it on any piece of paper.  please call us sooner if your blood sugar goes below 70, or if you have a lot of readings over 200.   Please come back for a follow-up appointment in 3 months.

## 2016-05-01 NOTE — Patient Instructions (Addendum)
Please continue the same insulin (150 units each morning) On this type of insulin schedule, you should eat meals on a regular schedule.  If a meal is missed or significantly delayed, your blood sugar could go low.   check your blood sugar twice a day.  vary the time of day when you check, between before the 3 meals, and at bedtime.  also check if you have symptoms of your blood sugar being too high or too low.  please keep a record of the readings and bring it to your next appointment here.  You can write it on any piece of paper.  please call us sooner if your blood sugar goes below 70, or if you have a lot of readings over 200.   Please come back for a follow-up appointment in 3 months.

## 2016-05-02 MED ORDER — FLUTICASONE FUROATE 27.5 MCG/SPRAY NA SUSP: 2.0000 | Freq: Every day | NASAL | 2 refills | Status: DC

## 2016-05-02 NOTE — Telephone Encounter (Signed)
Requests fluticasone nasal spray Rx, sent

## 2016-05-04 ENCOUNTER — Ambulatory Visit (INDEPENDENT_AMBULATORY_CARE_PROVIDER_SITE_OTHER): Payer: PPO | Admitting: Gastroenterology

## 2016-05-04 ENCOUNTER — Encounter: Payer: Self-pay | Admitting: Gastroenterology

## 2016-05-04 VITALS — BP 100/58 | HR 76 | Ht 63.75 in | Wt 303.4 lb

## 2016-05-04 DIAGNOSIS — R05 Cough: Secondary | ICD-10-CM | POA: Diagnosis not present

## 2016-05-04 DIAGNOSIS — Z1212 Encounter for screening for malignant neoplasm of rectum: Secondary | ICD-10-CM

## 2016-05-04 DIAGNOSIS — K219 Gastro-esophageal reflux disease without esophagitis: Secondary | ICD-10-CM | POA: Diagnosis not present

## 2016-05-04 DIAGNOSIS — Z1211 Encounter for screening for malignant neoplasm of colon: Secondary | ICD-10-CM | POA: Diagnosis not present

## 2016-05-04 DIAGNOSIS — R059 Cough, unspecified: Secondary | ICD-10-CM

## 2016-05-04 MED ORDER — RANITIDINE HCL 300 MG PO TABS: 300.0000 mg | ORAL_TABLET | Freq: Every day | ORAL | 3 refills | Status: DC

## 2016-05-04 MED ORDER — ESOMEPRAZOLE MAGNESIUM 40 MG PO CPDR: 40.0000 mg | DELAYED_RELEASE_CAPSULE | Freq: Two times a day (BID) | ORAL | 3 refills | Status: DC

## 2016-05-04 NOTE — Patient Instructions (Signed)
We have sent the following medications to your pharmacy for you to pick up at your convenience: Nexium, Zantac

## 2016-05-04 NOTE — Progress Notes (Signed)
Christy May    [id number]    Dec 08, 1950  Primary Care Physician:BLAND,VEITA J, MD  Referring Physician: Lucianne Lei, MD [address] [place], [place] [address]  Chief complaint:  GERD  HPI: 65 year old femalepreviously by [name] is here for follow up visit.   EGD in 11/2007 showed a hiatal hernia otherwise normal exam, also had colonoscopy in 01/2006 that was normal.   She is due for recall colonoscopy and has multiple comorbid conditions including morbid obesity with BMI greater than 50, obstructive sleep apnea, vocal cord dysfunction and chronic cough. she is currently on Nexium once daily with persistent breakthrough symptoms and cough, worse at bedtime  She denies dysphagia.   she feels Nexium has worked better for her. Denies any nausea, vomiting, abdominal pain, melena or bright red blood per rectum   Outpatient Encounter Prescriptions as of 05/04/2016  Medication Sig  . albuterol (PROVENTIL) (2.5 MG/3ML) 0.083% nebulizer solution Take 2.5 mg by nebulization every 6 (six) hours as needed for wheezing or shortness of breath.  . allopurinol (ZYLOPRIM) 300 MG tablet Take 300 mg by mouth daily.  Marland Kitchen aspirin EC 81 MG tablet Take 81 mg by mouth at bedtime.  . chlorpheniramine (CHLORPHEN) 4 MG tablet Take 4 mg by mouth daily.  . cholecalciferol (VITAMIN D) 1000 UNITS tablet Take 2,000 Units by mouth daily.   . famotidine (PEPCID) 20 MG tablet Take 20 mg by mouth at bedtime.  . fluticasone (FLONASE) 50 MCG/ACT nasal spray Place 2 sprays into both nostrils daily.  . fluticasone (VERAMYST) 27.5 MCG/SPRAY nasal spray Place 2 sprays into the nose daily.  . folic acid (FOLVITE) 1 MG tablet TAKE 4 TABLETS(4 MG) BY MOUTH DAILY  . gabapentin (NEURONTIN) 800 MG tablet Take 1 tablet (800 mg total) by mouth 3 (three) times daily.  . insulin NPH Human (NOVOLIN N RELION) 100 UNIT/ML injection Inject 1.5 mLs (150 Units total) into the skin every morning.  . irbesartan (AVAPRO)  300 MG tablet Take 300 mg by mouth daily.  Marland Kitchen loratadine (CLARITIN) 10 MG tablet Take 10 mg by mouth daily.  . metoprolol (LOPRESSOR) 50 MG tablet Take 75 mg by mouth 2 (two) times daily.  . montelukast (SINGULAIR) 10 MG tablet TAKE 1 TABLET(10 MG) BY MOUTH DAILY  . nystatin cream (MYCOSTATIN) Apply 1 application topically 2 (two) times daily as needed for dry skin.  . Omega-3 Fatty Acids (FISH OIL) 1200 MG CPDR Take 1,200 mg by mouth daily.  Marland Kitchen omeprazole (PRILOSEC) 40 MG capsule TAKE 1 CAPSULE(40 MG) BY MOUTH DAILY  . oxybutynin (DITROPAN-XL) 10 MG 24 hr tablet Take 1 tablet (10 mg total) by mouth daily.  . sodium chloride (OCEAN) 0.65 % SOLN nasal spray Place 2 sprays into both nostrils 2 (two) times daily.  . [DISCONTINUED] HYDROcodone-acetaminophen (NORCO) 7.5-325 MG tablet Take 1 tablet by mouth 3 (three) times daily as needed.  . [DISCONTINUED] omeprazole (PRILOSEC) 40 MG capsule Take 40 mg by mouth daily.   No facility-administered encounter medications on file as of 05/04/2016.     Allergies as of 05/04/2016 - Review Complete 05/04/2016  Allergen Reaction Noted  . Ace inhibitors Anaphylaxis and Cough 06/14/2012  . Penicillins Hives and Other (See Comments) 06/14/2012  . Adhesive [tape] Hives 06/14/2012    Past Medical History:  Diagnosis Date  . Allergic bronchitis   . Allergy   . Anemia, unspecified   . Anxiety   .  Arthritis    "neck, back, hands" (09/03/2014)  . Chronic airway obstruction, not elsewhere classified    "I was told I don't have this/tests done @ [facility] 05/2014"  . Chronic back pain   . Colon polyps   . Complication of anesthesia    "I've had recall; I probably stopped breathing at least 2 times" (09/03/2014)  . Dehydration   . Depressive disorder, not elsewhere classified   . Diabetic peripheral neuropathy ([place])   . Dysphagia 2007   historyof dysphagia with severe dysmotility by barium swallow-[name]  . Esophageal reflux   . Extrinsic asthma,  unspecified    "I was told I don't have this/tests done @ [name] 05/2014"  . Family history of adverse reaction to anesthesia    "my brother was told they lost him a couple times during OR"  . Headache    "weekly to monthly" (09/03/2014)  . Heart murmur   . History of hiatal hernia   . Hypertension   . Inflammatory and toxic neuropathy, unspecified   . Migraine    "couple times/month right now" (09/03/2014)  . Obesity, unspecified   . OSA (obstructive sleep apnea)     CPAP  . Spondylosis of unspecified site without mention of myelopathy   . Type II diabetes mellitus ([place])   . Unspecified essential hypertension   . Unspecified menopausal and postmenopausal disorder   . Unspecified vitamin D deficiency     Past Surgical History:  Procedure Laterality Date  . ABDOMINAL HYSTERECTOMY     "partial"  . ANTERIOR CERVICAL DECOMP/DISCECTOMY FUSION     multiple cervical spine levels;Dr. Arnoldo Morale  . APPENDECTOMY    . BACK SURGERY    . CHOLECYSTECTOMY OPEN    . COLONOSCOPY N/A 05/08/2014   Procedure: COLONOSCOPY;  Surgeon: [name], MD;  Location: [facility];  Service: Endoscopy;  Laterality: N/A;  . DILATION AND CURETTAGE OF UTERUS    . ESOPHAGOGASTRODUODENOSCOPY N/A 05/08/2014   Procedure: ESOPHAGOGASTRODUODENOSCOPY (EGD);  Surgeon: [name], MD;  Location: [name];  Service: Endoscopy;  Laterality: N/A;  . EXPLORATORY LAPAROTOMY    . EYE SURGERY    . FOOT SURGERY Right X 2   "took blood out of my arms and put platelets in my feet"  . INCISION AND DRAINAGE ABSCESS     Chest  . JOINT REPLACEMENT    . REFRACTIVE SURGERY Bilateral   . TONSILLECTOMY    . TOTAL KNEE ARTHROPLASTY Left 10/2003  . TOTAL KNEE ARTHROPLASTY Right 01/2004   [name]    Family History  Problem Relation Age of Onset  . Esophageal cancer Brother   . Esophageal cancer Sister     ?  . Colon polyps Brother   . Pancreatic cancer Sister     ?  . Diabetes Sister   . Diabetes      Aunt and  Uncle  . Heart disease Maternal Grandfather     Social History   Social History  . Marital status: Widowed    Spouse name: N/A  . Number of children: 1  . Years of education: N/A   Occupational History  . disabled Disabled   Social History Main Topics  . Smoking status: Former Smoker    Packs/day: 1.00    Years: 20.00    Types: Cigarettes    Quit date: 06/22/1986  . Smokeless tobacco: Never Used  . Alcohol use Yes  . Drug use: No  . Sexual activity: Not on file  Other Topics Concern  . Not on file   Social History Narrative   ** Merged History Encounter **       Patient does not get regular exercise.      Widowed 2004      Disabled      Review of systems: Review of Systems  Constitutional: Negative for fever and chills.  HENT: Negative.   Eyes: Negative for blurred vision.  Respiratory: Negative for cough, shortness of breath and wheezing.   Cardiovascular: Negative for chest pain and palpitations.  Gastrointestinal: as per HPI Genitourinary: Negative for dysuria, urgency, frequency and hematuria.  Musculoskeletal: Negative for myalgias, back pain and joint pain.  Skin: Negative for itching and rash.  Neurological: Negative for dizziness, tremors, focal weakness, seizures and loss of consciousness.  Endo/Heme/Allergies: Positive for seasonal allergies.  Psychiatric/Behavioral: Negative for depression, suicidal ideas and hallucinations.  All other systems reviewed and are negative.   Physical Exam: Vitals:   05/04/16 1448  BP: (!) 100/58  Pulse: 76   Body mass index is 52.48 kg/m. Gen:      No acute distress HEENT:  EOMI, sclera anicteric Neck:     No masses; no thyromegaly Lungs:    Clear to auscultation bilaterally; normal respiratory effort CV:         Regular rate and rhythm; no murmurs Abd:      + bowel sounds; soft, non-tender; no palpable masses, no distension Ext:    No edema; adequate peripheral perfusion Skin:      Warm and dry; no  rash Neuro: alert and oriented x 3 Psych: normal mood and affect  Data Reviewed:  Reviewed labs, radiology imaging, old records and pertinent past GI work up   Assessment and Plan/Recommendations: 65 year old female with history of morbid obesity, obstructive sleep apnea and vocal cord dysfunction here to discuss screening colonoscopy, due for recall colonoscopy, her last exam was in August 2007 that was normal. Patient is concerned and very reluctant to undergo colonoscopy as she is worried about being put to sleep during the procedure . Can consider alternate to for colorectal cancer screening (Cologaurd)  Chronic GERD  Nexium 40mg  BID Zantac at bedtime Antireflux measures Discussed weight loss Follow-up as needed  25 minutes was spent face-to-face with the patient. Greater than 50% of the time used for counseling as well as treatment plan and follow-up. She had multiple questions which were answered to her satisfaction  K. Denzil Magnuson , MD [phone] Mon-Fri 8a-5p [phone] after 5p, weekends, holidays  CC: Lucianne Lei, MD

## 2016-05-05 ENCOUNTER — Telehealth: Payer: Self-pay | Admitting: Emergency Medicine

## 2016-05-06 ENCOUNTER — Other Ambulatory Visit: Payer: Self-pay | Admitting: Family Medicine

## 2016-05-06 ENCOUNTER — Ambulatory Visit
Admission: RE | Admit: 2016-05-06 | Discharge: 2016-05-06 | Disposition: A | Discharge status: Home / Self Care | Payer: PPO | Source: Ambulatory Visit | Attending: Family Medicine | Admitting: Family Medicine

## 2016-05-06 DIAGNOSIS — M797 Fibromyalgia: Secondary | ICD-10-CM

## 2016-05-06 DIAGNOSIS — E1342 Other specified diabetes mellitus with diabetic polyneuropathy: Secondary | ICD-10-CM | POA: Diagnosis not present

## 2016-05-06 DIAGNOSIS — I10 Essential (primary) hypertension: Secondary | ICD-10-CM | POA: Diagnosis not present

## 2016-05-06 DIAGNOSIS — R042 Hemoptysis: Secondary | ICD-10-CM

## 2016-05-06 DIAGNOSIS — M790 Rheumatism, unspecified: Secondary | ICD-10-CM | POA: Diagnosis not present

## 2016-05-06 DIAGNOSIS — R05 Cough: Secondary | ICD-10-CM | POA: Diagnosis not present

## 2016-05-06 NOTE — Telephone Encounter (Signed)
Attempted to contact pt. No answer, no option to leave a message. Will try back.  

## 2016-05-06 NOTE — Telephone Encounter (Signed)
atc pt, no answer, no vm.  wcb.  

## 2016-05-07 NOTE — Telephone Encounter (Signed)
lmomtcb x 3  

## 2016-05-08 MED ORDER — HYDROCODONE-HOMATROPINE 5-1.5 MG/5ML PO SYRP: 5.0000 mL | ORAL_SOLUTION | Freq: Four times a day (QID) | ORAL | 0 refills | Status: DC | PRN

## 2016-05-08 NOTE — Telephone Encounter (Signed)
Ok with me 

## 2016-05-08 NOTE — Telephone Encounter (Signed)
Ok to give her hycodan 5cc q6h prn for cough

## 2016-05-08 NOTE — Telephone Encounter (Signed)
Rx has been placed in brown folder up front ready for pick up. Pt is aware. Nothing further needed.

## 2016-05-08 NOTE — Telephone Encounter (Signed)
Pt calling to check on status of refill request, please advise.Christy May ° °

## 2016-05-08 NOTE — Telephone Encounter (Signed)
MW will you be willing to sign this prescription in RB's absence? Thanks.

## 2016-05-08 NOTE — Telephone Encounter (Signed)
Pt scheduled with RB 05/11/16. Pt c/o prod cough with clear mucus at times a little bloody, wheezing, increased sob & chest discomfort. pt denies any fever, chills or sweats. she requesting cough syrup to get through the weekend. Pt states she seen her PCP last week for coughing up blood, pt was started on dexamethasone & but states she feels this is helping. Pt also giving hydromet which she is out of.  RB please advise. Thanks.

## 2016-05-08 NOTE — Telephone Encounter (Signed)
Spoke with pt who states she will come wait on this RX. I explained to her that RB ok'd the RX, however we have to wait from the on call doctor to agree to sign it. Pt voiced understanding and states she don't mind waiting.

## 2016-05-08 NOTE — Telephone Encounter (Signed)
Patient called to confirm her appointment on Monday with Dr. Lamonte Sakai but is asking for cough med to help her through the weekend. She was not aware we had been trying to call her. She is requesting RX for cough med that she usually has to pick up.  CB [phone] or the cell [phone]. I advised her to stay by the phone as someone would have to speak with her before she can pick this up.  She understood and said she will stay by the phone.

## 2016-05-08 NOTE — Telephone Encounter (Signed)
Lmtcb. Will await call back 

## 2016-05-11 ENCOUNTER — Encounter: Payer: Self-pay | Admitting: Emergency Medicine

## 2016-05-11 ENCOUNTER — Ambulatory Visit (INDEPENDENT_AMBULATORY_CARE_PROVIDER_SITE_OTHER): Payer: PPO | Admitting: Emergency Medicine

## 2016-05-11 DIAGNOSIS — J383 Other diseases of vocal cords: Secondary | ICD-10-CM | POA: Diagnosis not present

## 2016-05-11 MED ORDER — IPRATROPIUM BROMIDE 0.03 % NA SOLN: 2.0000 | Freq: Three times a day (TID) | NASAL | 5 refills | Status: DC | PRN

## 2016-05-11 MED ORDER — PANTOPRAZOLE SODIUM 40 MG PO TBEC: 40.0000 mg | DELAYED_RELEASE_TABLET | Freq: Two times a day (BID) | ORAL | 5 refills | Status: DC

## 2016-05-11 MED ORDER — HYDROCODONE-HOMATROPINE 5-1.5 MG/5ML PO SYRP: 5.0000 mL | ORAL_SOLUTION | Freq: Four times a day (QID) | ORAL | 0 refills | Status: DC | PRN

## 2016-05-11 NOTE — Assessment & Plan Note (Signed)
Difficult situation. VCD and cough are persistent, minimal relief unless on prednisone. Using hycodan around the clock, which I am not comfortable with continuing indefinitely. Will try to maximize GERD and rhinitis therapy, make alterations as below.   Please stop fish oil for now.  Stop nexium Start pantoprazole 40mg  twice a day Please continue:  - zantac daily - singulair once every evening - gabapentin as you have been taking it - flonase 2 sprays each nostril twice a day - chlorpheniramine 4mg  up to every 6 hours if needed for drainage - nasal saline washes once a day  - dexamethasone until completely gone Please start Atrovent nasal spray, 2 sprays each nostril 2-3 times a day to stop nasal drainage Use hycodan 5cc up to every 6 hours if needed to suppress cough. This medication is habit forming and loses its effectiveness over time. We will not be able to continue it indefinitely.  Follow with Dr Lamonte Sakai in 1 month

## 2016-05-11 NOTE — Patient Instructions (Addendum)
Please stop fish oil for now.  Stop nexium Start pantoprazole 40mg  twice a day Please continue:  - zantac daily - singulair once every evening - gabapentin as you have been taking it - flonase 2 sprays each nostril twice a day - chlorpheniramine 4mg  up to every 6 hours if needed for drainage - nasal saline washes once a day  - dexamethasone until completely gone Please start Atrovent nasal spray, 2 sprays each nostril 2-3 times a day to stop nasal drainage Use hycodan 5cc up to every 6 hours if needed to suppress cough. This medication is habit forming and loses its effectiveness over time. We will not be able to continue it indefinitely.  Follow with Dr Lamonte Sakai in 1 month

## 2016-05-11 NOTE — Progress Notes (Signed)
   Subjective:    Patient ID: Christy May, female    DOB: Jul 03, 1950, 65 y.o.   MRN: [id number]  HPI 65 yo woman, former smoker with obesity, OSA, HTN on irbesartan, DM, childhood asthma. Carries a dx of adult asthma although PFT 2015 most consistent w restriction and variable UA irritation on inspiratory loop. She has done speech rx and training for VCD before. Contributing factors are chronic rhinitis, GERD. She has felt that albuterol has helped her in the past.   She is on rx for  GERD and allergic rhinitis. Still has breakthrough GERD sx. She has done speech therapy for VCD and chronic cough before. Has been rx with aggressive allergy regimen, Nexium bid. She has been dealing with a flare of cough and dyspnea for last 7 weeks. Was treated with azithro + Pred + hycodan, then more recently again with dexamethasone, finishing taper now. She has not been able to get nexium bid, was temporarily off flonase due to concern that it was contributing to cough. She feels that when she urinates it sends her signal to cough (she used to have stress / cough incontinence). She states that she is having significant drainage from nose.   Current meds: Nexium 40 qd (increased to 40 bid recently, but could through insurance), singulair, flonase / veramyst, chlorpheniramine, gabapentin, loratadine, zantac, ocean spray. Fish oil.    Review of Systems As per HPI     Objective:   Physical Exam Vitals:   05/11/16 1650  BP: 124/66  BP Location: Left Arm  Cuff Size: Large  Pulse: 66  SpO2: 94%  Weight: (!) 307 lb 6.4 oz (139.4 kg)  Height: 5' 4.25" (1.632 m)    GEN: A/Ox3; depressed affect, obese woman in no distress   HEENT:  OP clear, hoarse voice. No postnasal drip evident  NECK:  No stridor  RESP  Clear bilaterally, no wheeze on forced expiration  CARD:  RRR, no m/r/g, trace edema.   Musco: Warm bil, no deformities or joint swelling noted.   Neuro: alert, no focal deficits noted.     Skin: Warm, no lesions or rashes       Assessment & Plan:  Vocal cord dysfunction Difficult situation. VCD and cough are persistent, minimal relief unless on prednisone. Using hycodan around the clock, which I am not comfortable with continuing indefinitely. Will try to maximize GERD and rhinitis therapy, make alterations as below.   Please stop fish oil for now.  Stop nexium Start pantoprazole 40mg  twice a day Please continue:  - zantac daily - singulair once every evening - gabapentin as you have been taking it - flonase 2 sprays each nostril twice a day - chlorpheniramine 4mg  up to every 6 hours if needed for drainage - nasal saline washes once a day  - dexamethasone until completely gone Please start Atrovent nasal spray, 2 sprays each nostril 2-3 times a day to stop nasal drainage Use hycodan 5cc up to every 6 hours if needed to suppress cough. This medication is habit forming and loses its effectiveness over time. We will not be able to continue it indefinitely.  Follow with Dr Lamonte Sakai in 1 month  Baltazar Apo, MD, PhD 05/11/2016, 5:28 PM [facility] [phone] or if no answer [phone]

## 2016-05-12 ENCOUNTER — Telehealth: Payer: Self-pay | Admitting: Emergency Medicine

## 2016-05-12 DIAGNOSIS — I10 Essential (primary) hypertension: Secondary | ICD-10-CM | POA: Diagnosis not present

## 2016-05-12 DIAGNOSIS — E1342 Other specified diabetes mellitus with diabetic polyneuropathy: Secondary | ICD-10-CM | POA: Diagnosis not present

## 2016-05-12 DIAGNOSIS — F338 Other recurrent depressive disorders: Secondary | ICD-10-CM | POA: Diagnosis not present

## 2016-05-12 DIAGNOSIS — M545 Low back pain: Secondary | ICD-10-CM | POA: Diagnosis not present

## 2016-05-12 NOTE — Telephone Encounter (Signed)
Called and spoke with Abby from [facility].  She stated that the pt has filled over 800 ML of the hydromet since Oct. 1st, 2017.  She has called the other offices that this pt has had prescriptions from.  Will forward to RB to make him aware. Please advise. Thanks  Allergies  Allergen Reactions  . Ace Inhibitors Anaphylaxis and Cough  . Penicillins Hives and Other (See Comments)    Has patient had a PCN reaction causing immediate rash, facial/tongue/throat swelling, SOB or lightheadedness with hypotension:  No Has patient had a PCN reaction causing severe rash involving mucus membranes or skin necrosis:  No Has patient had a PCN reaction that required hospitalization No Has patient had a PCN reaction occurring within the last 10 years: No If all of the above answers are "NO", then may proceed with Cephalosporin use.  . Adhesive [Tape] Hives

## 2016-05-13 ENCOUNTER — Telehealth: Payer: Self-pay | Admitting: Emergency Medicine

## 2016-05-13 ENCOUNTER — Encounter: Payer: Self-pay | Admitting: *Deleted

## 2016-05-13 ENCOUNTER — Other Ambulatory Visit: Payer: Self-pay | Admitting: Emergency Medicine

## 2016-05-13 DIAGNOSIS — M545 Low back pain: Secondary | ICD-10-CM | POA: Diagnosis not present

## 2016-05-13 NOTE — Telephone Encounter (Signed)
Thank you for letting me know. She has been overusing, misled me about her usage. I do not believe I will be able to provide this for her, see her in office anymore. Will notify her by letter.

## 2016-05-13 NOTE — Telephone Encounter (Signed)
Patient dismissed from [facility] by Baltazar Apo MD , effective May 13, 2016. Dismissal letter sent out by certified / registered mail.  DAJ

## 2016-05-15 ENCOUNTER — Other Ambulatory Visit: Payer: Self-pay | Admitting: Endocrinology

## 2016-05-19 ENCOUNTER — Encounter: Payer: Self-pay | Admitting: Podiatry

## 2016-05-19 ENCOUNTER — Telehealth: Payer: Self-pay | Admitting: Endocrinology

## 2016-05-19 ENCOUNTER — Ambulatory Visit (INDEPENDENT_AMBULATORY_CARE_PROVIDER_SITE_OTHER): Payer: PPO | Admitting: Podiatry

## 2016-05-19 VITALS — Ht 67.25 in | Wt 307.0 lb

## 2016-05-19 DIAGNOSIS — M79676 Pain in unspecified toe(s): Secondary | ICD-10-CM

## 2016-05-19 DIAGNOSIS — E1149 Type 2 diabetes mellitus with other diabetic neurological complication: Secondary | ICD-10-CM | POA: Diagnosis not present

## 2016-05-19 DIAGNOSIS — Q828 Other specified congenital malformations of skin: Secondary | ICD-10-CM | POA: Diagnosis not present

## 2016-05-19 DIAGNOSIS — B351 Tinea unguium: Secondary | ICD-10-CM | POA: Diagnosis not present

## 2016-05-19 NOTE — Telephone Encounter (Signed)
Requested a call back from the patient to verify what her insulin dosage is currently. Will touch base with patient in the morning as well.

## 2016-05-19 NOTE — Telephone Encounter (Signed)
Please verify insulin is 150 units qam Then decrease to 140 units qam Please let us know if you know of a reason why it is low (weight loss or recent illness.

## 2016-05-19 NOTE — Progress Notes (Signed)
Patient ID: Christy May, female   DOB: Mar 17, 1951, 65 y.o.   MRN: [id number] Complaint:  Visit Type: Patient returns to my office for continued preventative foot care services. Complaint: Patient states" my nails have grown long and thick and become painful to walk and wear shoes" Patient has been diagnosed with DM with hammer toes and capsulitis right foot.. The patient presents for preventative foot care services. No changes to ROS.  She also has painful callus under the ball of her left foot.    Podiatric Exam: Vascular: dorsalis pedis and posterior tibial pulses are palpable right foot and diminished left foot.. Capillary return is immediate. Temperature gradient is WNL. Skin turgor WNL  Sensorium: Normal Semmes Weinstein monofilament test. Normal tactile sensation bilaterally. Nail Exam: Pt has thick disfigured discolored nails with subungual debris noted bilateral entire nail hallux through fifth toenails Ulcer Exam: There is no evidence of ulcer or pre-ulcerative changes or infection. Orthopedic Exam: Muscle tone and strength are WNL. No limitations in general ROM. No crepitus or effusions noted. Foot type and digits show no abnormalities. Hallux varus left foot.  Hammer toes 3,4,5 right foot. Skin:  Porokeratosis sub 2 left.. No infection or ulcers  Diagnosis:  Onychomycosis, , Pain in right toe, pain in left toes,  Diabetic neuropathy  Hammer toes 3,4,5 right foot.  Hallux varus left foot.  Treatment & Plan Procedures and Treatment: Consent by patient was obtained for treatment procedures. The patient understood the discussion of treatment and procedures well. All questions were answered thoroughly reviewed. Debridement of mycotic and hypertrophic toenails, 1 through 5 bilateral and clearing of subungual debris. No ulceration, no infection noted. .  Hammer toes 3,4,5 right foot.  Hallux varus 1st MPJ left foot. Return Visit-Office Procedure: Patient instructed to return to the office  for a follow up visit 3 months for continued evaluation and treatment.    Gardiner Barefoot DPM

## 2016-05-19 NOTE — Telephone Encounter (Signed)
Her bs was at 53 yesterday 123 today feels bad

## 2016-05-19 NOTE — Telephone Encounter (Signed)
See message and please advise, Thanks!  

## 2016-05-20 DIAGNOSIS — M545 Low back pain: Secondary | ICD-10-CM | POA: Diagnosis not present

## 2016-05-20 NOTE — Telephone Encounter (Signed)
I contacted the patient and advised patient of the new medication dosage. Requested a call back from the patient to discuss as needed.

## 2016-05-20 NOTE — Telephone Encounter (Signed)
[facility] Call: Caller says she was returning a call to the office to Dr. Loanne Drilling something about her blood sugar readings.  She says her sugar dropped to 42 yesterday.  She has been shaky all day.

## 2016-05-21 DIAGNOSIS — M545 Low back pain: Secondary | ICD-10-CM | POA: Diagnosis not present

## 2016-05-25 NOTE — Telephone Encounter (Signed)
Received signed domestic return receipt verifying delivery of certified letter on May 20, 2016. Article number [id number] [place]

## 2016-05-26 DIAGNOSIS — M545 Low back pain: Secondary | ICD-10-CM | POA: Diagnosis not present

## 2016-05-28 DIAGNOSIS — M545 Low back pain: Secondary | ICD-10-CM | POA: Diagnosis not present

## 2016-05-29 ENCOUNTER — Other Ambulatory Visit: Payer: Self-pay | Admitting: Endocrinology

## 2016-06-02 ENCOUNTER — Telehealth: Payer: Self-pay | Admitting: Emergency Medicine

## 2016-06-02 ENCOUNTER — Other Ambulatory Visit: Payer: Self-pay | Admitting: Internal Medicine

## 2016-06-02 ENCOUNTER — Other Ambulatory Visit: Payer: Self-pay | Admitting: Endocrinology

## 2016-06-02 NOTE — Telephone Encounter (Signed)
Tried to call pt. But was unable to leave a vm x1

## 2016-06-02 NOTE — Telephone Encounter (Signed)
Patient returned phone call..contact # [phone].Christy May

## 2016-06-02 NOTE — Telephone Encounter (Signed)
TP  Please Advise- Sick Message  This is a pt. Of RB. She called in today to see if something could be called in. She is c/o increase sob,wheezing,coughing with thick clear mucus,congestion,body aches. Denies fever  Pharmacy: [facility]

## 2016-06-03 ENCOUNTER — Ambulatory Visit (INDEPENDENT_AMBULATORY_CARE_PROVIDER_SITE_OTHER): Payer: PPO | Admitting: Emergency Medicine

## 2016-06-03 ENCOUNTER — Other Ambulatory Visit: Payer: Self-pay | Admitting: *Deleted

## 2016-06-03 ENCOUNTER — Encounter: Payer: Self-pay | Admitting: Emergency Medicine

## 2016-06-03 DIAGNOSIS — J383 Other diseases of vocal cords: Secondary | ICD-10-CM | POA: Diagnosis not present

## 2016-06-03 MED ORDER — PREDNISONE 10 MG PO TABS: ORAL_TABLET | ORAL | 0 refills | Status: DC

## 2016-06-03 NOTE — Telephone Encounter (Signed)
Patient calling, states symptoms are same as yesterday.  Please call her at [phone].

## 2016-06-03 NOTE — Addendum Note (Signed)
Addended by: Valerie Salts on: 06/03/2016 04:10 PM   Modules accepted: Orders

## 2016-06-03 NOTE — Telephone Encounter (Signed)
Pt scheduled to see [place] 06-03-16 @ 3:00. Nothing further needed.

## 2016-06-03 NOTE — Assessment & Plan Note (Signed)
Her exam is at baseline with no wheezing or upper airway noise. She does complain of some increased upper after he symptoms and may have had a sick contact. She is having some hoarse voice and cough. I spoke with her in detail regarding the dismissal letter that she received. I explained to her that it was due to her overuse of cough medication containing narcotics. We have agreed that I will reinstate her here, we will follow her but will not prescribe cough medication or narcotics for her anymore. She will have to get this elsewhere. I will continue her current maintenance medications and treat her with a prednisone taper for some evolving upper airway noise. 6 months or when necessary.

## 2016-06-03 NOTE — Telephone Encounter (Signed)
Message was not addressed due to the power outage.  RB please advise today.  thanks

## 2016-06-03 NOTE — Patient Outreach (Addendum)
[facility]) Care Management  06/03/2016  Christy May Apr 06, 1951 [id number]   Care Management Assistant requesting that this social worker return patient's call.  Patient had contacted RNCM Thea Silversmith who was not available to return patient's call..  Patient requested a [facility] calendar, however this social worker explained that since patient has  successfully graduated from the [employer] management program she is not eligible for a [facility] calendar. Patient encouraged to contact this social worker with any further questions or concerns.   Sheralyn Boatman [facility] Care Management [phone]

## 2016-06-03 NOTE — Progress Notes (Signed)
Subjective:    Patient ID: Christy May, female    DOB: 12-27-1950, 65 y.o.   MRN: [id number]  HPI 65 yo woman, former smoker with obesity, OSA, HTN on irbesartan, DM, childhood asthma. Carries a dx of adult asthma although PFT 2015 most consistent w restriction and variable UA irritation on inspiratory loop. She has done speech rx and training for VCD before. Contributing factors are chronic rhinitis, GERD. She has felt that albuterol has helped her in the past.   She is on rx for  GERD and allergic rhinitis. Still has breakthrough GERD sx. She has done speech therapy for VCD and chronic cough before. Has been rx with aggressive allergy regimen, Nexium bid. She has been dealing with a flare of cough and dyspnea for last 7 weeks. Was treated with azithro + Pred + hycodan, then more recently again with dexamethasone, finishing taper now. She has not been able to get nexium bid, was temporarily off flonase due to concern that it was contributing to cough. She feels that when she urinates it sends her signal to cough (she used to have stress / cough incontinence). She states that she is having significant drainage from nose.   Current meds: Nexium 40 qd (increased to 40 bid recently, but could through insurance), singulair, flonase / veramyst, chlorpheniramine, gabapentin, loratadine, zantac, ocean spray. Fish oil.   Acute OV 06/03/16 -- This is an acute office visit for patient well known to me with a history of adult asthma and upper airway irritation syndrome/vocal cord dysfunction. She also has obstructive sleep apnea, hypertension, diabetes. Chronic cough that is being driven by chronic rhinitis and GERD. I last saw her on 05/11/16, I added pantoprazole to Zantac. She continued gabapentin, Singulair, Flonase, chlorpheniramine, nasal saline washes. She was also on dexamethasone and I asked her to continue this. I added Atrovent nasal spray. Since that visit I was notified by the pharmacy that she  had been overprescribed cough syrup with codeine: 800 mL in 6-7 week time period, prescriptions filled from 7 different providers. Based on this I informed her that she would be dismissed from th practice. She sent me a personal letter to explain the situation and informed me that this was a misunderstanding.   She presents today describing new onset nasal congestion, nausea, cough, HA, chest discomfort beginning about 3-4 days ago. Her dexamethasone ended few weeks ago. She may have had sick contacts at church.   Review of Systems As per HPI     Objective:   Physical Exam Vitals:   06/03/16 1523  BP: 118/74  BP Location: Left Arm  Patient Position: Sitting  Cuff Size: Normal  Pulse: 83  SpO2: 96%  Weight: (!) 313 lb 3.2 oz (142.1 kg)  Height: 5' 7.25" (1.708 m)    GEN: A/Ox3; depressed affect, obese woman in no distress   HEENT:  OP clear, hoarse voice. No postnasal drip evident  NECK:  No stridor  RESP  Clear bilaterally, no wheeze on forced expiration  CARD:  RRR, no m/r/g, trace edema.   Musco: Warm bil, no deformities or joint swelling noted.   Neuro: alert, no focal deficits noted.    Skin: Warm, no lesions or rashes       Assessment & Plan:  Vocal cord dysfunction Her exam is at baseline with no wheezing or upper airway noise. She does complain of some increased upper after he symptoms and may have had a sick contact. She is having some hoarse  voice and cough. I spoke with her in detail regarding the dismissal letter that she received. I explained to her that it was due to her overuse of cough medication containing narcotics. We have agreed that I will reinstate her here, we will follow her but will not prescribe cough medication or narcotics for her anymore. She will have to get this elsewhere. I will continue her current maintenance medications and treat her with a prednisone taper for some evolving upper airway noise. 6 months or when necessary.  Baltazar Apo,  MD, PhD 06/03/2016, 4:04 PM [facility] [phone] or if no answer [phone]

## 2016-06-03 NOTE — Patient Instructions (Signed)
Please continue your current medications as you have been taking them  We will reinstate you in our clinic to be followed by Dr Lamonte Sakai.  We will not be able to prescribe or to authorize refills of any cough medications containing narcotics.  Take prednisone taper as follows : 40mg  daily for 3 days, then 30mg  daily for 3 days, then 20mg  daily for 3 days, then 10mg  daily for 3 days, then stop  Follow with Dr Lamonte Sakai in 6 months or sooner if you have any problems

## 2016-06-11 ENCOUNTER — Other Ambulatory Visit: Payer: Self-pay | Admitting: Family Medicine

## 2016-06-13 ENCOUNTER — Telehealth: Payer: Self-pay | Admitting: Internal Medicine

## 2016-06-13 MED ORDER — FLUCONAZOLE 100 MG PO TABS: 100.0000 mg | ORAL_TABLET | Freq: Every day | ORAL | 0 refills | Status: DC

## 2016-06-13 NOTE — Telephone Encounter (Signed)
C/o yeast infection after abx  usually takes diflucan 100 mg x 2 days > called in

## 2016-06-16 NOTE — Telephone Encounter (Signed)
Pt has not been seen in over a year. Refill denied.  

## 2016-06-24 DIAGNOSIS — M797 Fibromyalgia: Secondary | ICD-10-CM | POA: Diagnosis not present

## 2016-06-24 DIAGNOSIS — F338 Other recurrent depressive disorders: Secondary | ICD-10-CM | POA: Diagnosis not present

## 2016-06-24 DIAGNOSIS — I10 Essential (primary) hypertension: Secondary | ICD-10-CM | POA: Diagnosis not present

## 2016-06-25 ENCOUNTER — Ambulatory Visit: Payer: PPO | Admitting: Emergency Medicine

## 2016-06-29 DIAGNOSIS — H25812 Combined forms of age-related cataract, left eye: Secondary | ICD-10-CM | POA: Diagnosis not present

## 2016-06-29 DIAGNOSIS — H25813 Combined forms of age-related cataract, bilateral: Secondary | ICD-10-CM | POA: Diagnosis not present

## 2016-06-29 DIAGNOSIS — H2512 Age-related nuclear cataract, left eye: Secondary | ICD-10-CM | POA: Diagnosis not present

## 2016-06-30 ENCOUNTER — Ambulatory Visit: Payer: PPO | Admitting: Emergency Medicine

## 2016-07-10 ENCOUNTER — Telehealth: Payer: Self-pay | Admitting: Emergency Medicine

## 2016-07-10 ENCOUNTER — Encounter: Payer: Self-pay | Admitting: *Deleted

## 2016-07-10 ENCOUNTER — Other Ambulatory Visit: Payer: Self-pay | Admitting: Emergency Medicine

## 2016-07-10 MED ORDER — PREDNISONE 10 MG PO TABS: ORAL_TABLET | ORAL | 0 refills | Status: DC

## 2016-07-10 NOTE — Telephone Encounter (Signed)
Spoke with pt. States that she is not feeling well. Reports cough, chest tightness, wheezing and SOB. Cough is producing white mucus. Denies fever. Has not taken any OTC medications. Symptoms started 1 week ago. Would like to have something called in.  MW - please advise as RB is not available today. Thanks.

## 2016-07-10 NOTE — Telephone Encounter (Signed)
Pt aware of MW's recommendation. Rx sent to preferred pharmacy. Nothing further needed.

## 2016-07-10 NOTE — Telephone Encounter (Signed)
Prednisone 10 mg take  4 each am x 2 days,   2 each am x 2 days,  1 each am x 2 days and stop  Use neb up to every 4 h prn

## 2016-07-22 ENCOUNTER — Encounter (HOSPITAL_COMMUNITY): Payer: Self-pay | Admitting: Emergency Medicine

## 2016-07-22 ENCOUNTER — Ambulatory Visit (HOSPITAL_COMMUNITY)
Admission: EM | Admit: 2016-07-22 | Discharge: 2016-07-22 | Disposition: A | Discharge status: Home / Self Care | Payer: PPO | Attending: Family Medicine | Admitting: Family Medicine

## 2016-07-22 DIAGNOSIS — M5440 Lumbago with sciatica, unspecified side: Secondary | ICD-10-CM | POA: Diagnosis not present

## 2016-07-22 DIAGNOSIS — G8929 Other chronic pain: Secondary | ICD-10-CM

## 2016-07-22 MED ORDER — KETOROLAC TROMETHAMINE 60 MG/2ML IM SOLN
INTRAMUSCULAR | Status: AC
  Filled 2016-07-22: qty 2

## 2016-07-22 MED ORDER — KETOROLAC TROMETHAMINE 60 MG/2ML IM SOLN
60.0000 mg | Freq: Once | INTRAMUSCULAR | Status: AC
  Administered 2016-07-22: 60 mg via INTRAMUSCULAR

## 2016-07-22 MED ORDER — CYCLOBENZAPRINE HCL 5 MG PO TABS: 5.0000 mg | ORAL_TABLET | Freq: Three times a day (TID) | ORAL | 1 refills | Status: DC

## 2016-07-22 NOTE — ED Provider Notes (Signed)
[place]    CSN: [id number] Arrival date & time: 07/22/16  1510     History   Chief Complaint Chief Complaint  Patient presents with  . Back Pain    HPI Christy May is a 66 y.o. female.   The history is provided by the patient.  Back Pain  Location:  Lumbar spine Quality:  Shooting Radiates to:  R posterior upper leg and L posterior upper leg Pain severity:  Moderate Onset quality:  Gradual Duration:  3 days Progression:  Unchanged Chronicity:  Chronic Context comment:  Chronic back issues, nl gi and gu fxn,  Worsened by:  Palpation and sitting Associated symptoms: pelvic pain   Associated symptoms: no abdominal pain and no fever     Past Medical History:  Diagnosis Date  . Allergic bronchitis   . Allergy   . Anemia, unspecified   . Anxiety   . Arthritis    "neck, back, hands" (09/03/2014)  . Chronic airway obstruction, not elsewhere classified    "I was told I don't have this/tests done @ [facility] 05/2014"  . Chronic back pain   . Colon polyps   . Complication of anesthesia    "I've had recall; I probably stopped breathing at least 2 times" (09/03/2014)  . Dehydration   . Depressive disorder, not elsewhere classified   . Diabetic peripheral neuropathy ([place])   . Dysphagia 2007   historyof dysphagia with severe dysmotility by barium swallow-[name]  . Esophageal reflux   . Extrinsic asthma, unspecified    "I was told I don't have this/tests done @ [facility] 05/2014"  . Family history of adverse reaction to anesthesia    "my brother was told they lost him a couple times during OR"  . Headache    "weekly to monthly" (09/03/2014)  . Heart murmur   . History of hiatal hernia   . Hypertension   . Inflammatory and toxic neuropathy, unspecified   . Migraine    "couple times/month right now" (09/03/2014)  . Obesity, unspecified   . OSA (obstructive sleep apnea)     CPAP  . Spondylosis of unspecified site without mention of myelopathy   .  Type II diabetes mellitus ([place])   . Unspecified essential hypertension   . Unspecified menopausal and postmenopausal disorder   . Unspecified vitamin D deficiency     Patient Active Problem List   Diagnosis Date Noted  . Morbid (severe) obesity due to excess calories ([place]) 11/11/2015  . Acute bronchitis 10/29/2015  . Diabetes ([place]) 08/21/2015  . Chest pain 07/14/2015  . Dyspnea on exertion 07/05/2015  . Obstructive sleep apnea 07/05/2015  . Constipation 03/05/2015  . Allergic rhinitis 03/05/2015  . Hearing loss 03/05/2015  . Vocal cord dysfunction 03/05/2015  . Pain in joint, shoulder region 09/11/2014  . Weakness generalized 09/02/2014  . Acute esophagitis 05/08/2014  . Benign neoplasm of sigmoid colon 05/08/2014  . Change in bowel habits 04/17/2014  . Fecal incontinence 04/17/2014  . Urge incontinence 04/08/2014  . Cough 12/06/2013  . Chronic pain syndrome 08/24/2013  . Orthostasis 07/18/2013  . Acute kidney injury ([place]) 07/18/2013  . Bilateral carpal tunnel syndrome 02/14/2013  . Gout 01/04/2013  . Failure to thrive 06/28/2012  . Gait disorder 06/28/2012  . Preventative health care 08/23/2011  . Cervical radiculopathy 02/11/2011  . Low back pain 02/11/2011  . VITAMIN D DEFICIENCY 11/22/2009  . ANEMIA-NOS 11/22/2009  . DEGENERATIVE JOINT DISEASE, CERVICAL SPINE 11/22/2009  . Depression 09/16/2009  .  POLYNEUROPATHY 09/16/2009  . Morbid obesity ([place]) 09/09/2009  . Essential hypertension 09/09/2009  . GERD 09/09/2009  . Sleep apnea 09/09/2009    Past Surgical History:  Procedure Laterality Date  . ABDOMINAL HYSTERECTOMY     "partial"  . ANTERIOR CERVICAL DECOMP/DISCECTOMY FUSION     multiple cervical spine levels;Dr. Arnoldo Morale  . APPENDECTOMY    . BACK SURGERY    . CHOLECYSTECTOMY OPEN    . COLONOSCOPY Christy May 05/08/2014   Procedure: COLONOSCOPY;  Surgeon: [name], MD;  Location: [facility];  Service: Endoscopy;  Laterality: Christy May;  . DILATION AND CURETTAGE OF  UTERUS    . ESOPHAGOGASTRODUODENOSCOPY Christy May 05/08/2014   Procedure: ESOPHAGOGASTRODUODENOSCOPY (EGD);  Surgeon: [name], MD;  Location: [name];  Service: Endoscopy;  Laterality: Christy May;  . EXPLORATORY LAPAROTOMY    . EYE SURGERY    . FOOT SURGERY Right X 2   "took blood out of my arms and put platelets in my feet"  . INCISION AND DRAINAGE ABSCESS     Chest  . JOINT REPLACEMENT    . REFRACTIVE SURGERY Bilateral   . TONSILLECTOMY    . TOTAL KNEE ARTHROPLASTY Left 10/2003  . TOTAL KNEE ARTHROPLASTY Right 01/2004   [name]    OB History    No data available       Home Medications    Prior to Admission medications   Medication Sig Start Date End Date Taking? Authorizing Provider  albuterol (PROVENTIL) (2.5 MG/3ML) 0.083% nebulizer solution Take 2.5 mg by nebulization every 6 (six) hours as needed for wheezing or shortness of breath.    Historical Provider, MD  allopurinol (ZYLOPRIM) 300 MG tablet Take 300 mg by mouth daily.    Historical Provider, MD  aspirin EC 81 MG tablet Take 81 mg by mouth at bedtime.    Historical Provider, MD  chlorpheniramine (CHLORPHEN) 4 MG tablet Take 4 mg by mouth daily.    Historical Provider, MD  cholecalciferol (VITAMIN D) 1000 UNITS tablet Take 2,000 Units by mouth daily.     Historical Provider, MD  cyclobenzaprine (FLEXERIL) 5 MG tablet Take 1 tablet (5 mg total) by mouth 3 (three) times daily. 07/22/16   Billy Fischer, MD  famotidine (PEPCID) 20 MG tablet TAKE 1 TABLET BY MOUTH EVERY NIGHT AT BEDTIME 05/15/16   Collene Gobble, MD  fluconazole (DIFLUCAN) 100 MG tablet Take 1 tablet (100 mg total) by mouth daily. 06/13/16   Tanda Rockers, MD  fluticasone (FLONASE) 50 MCG/ACT nasal spray Place 2 sprays into both nostrils daily.    Historical Provider, MD  fluticasone (VERAMYST) 27.5 MCG/SPRAY nasal spray Place 2 sprays into the nose daily. 05/02/16 05/02/17  Juanito Doom, MD  folic acid (FOLVITE) 1 MG tablet TAKE 4 TABLETS(4 MG) BY MOUTH  DAILY 05/17/16   Renato Shin, MD  gabapentin (NEURONTIN) 800 MG tablet Take 1 tablet (800 mg total) by mouth 3 (three) times daily. 02/27/15   Biagio Borg, MD  HYDROcodone-homatropine [facility]) 5-1.5 MG/5ML syrup Take 5 mLs by mouth every 6 (six) hours as needed for cough. 05/11/16   Collene Gobble, MD  insulin NPH Human (NOVOLIN N RELION) 100 UNIT/ML injection Inject 1.5 mLs (150 Units total) into the skin every morning. 05/01/16   Renato Shin, MD  Insulin Syringe-Needle U-100 (INSULIN SYRINGE 1CC/31GX5/16") 31G X 5/16" 1 ML MISC USE TO INJECT INSULIN TWICE DAILY 05/29/16   Renato Shin, MD  ipratropium (ATROVENT) 0.03 % nasal spray Place 2 sprays  into both nostrils 3 (three) times daily as needed for rhinitis. 05/11/16   Collene Gobble, MD  irbesartan (AVAPRO) 300 MG tablet Take 300 mg by mouth daily.    Historical Provider, MD  loratadine (CLARITIN) 10 MG tablet Take 10 mg by mouth daily.    Historical Provider, MD  metoprolol (LOPRESSOR) 50 MG tablet Take 75 mg by mouth 2 (two) times daily.    Historical Provider, MD  montelukast (SINGULAIR) 10 MG tablet TAKE 1 TABLET(10 MG) BY MOUTH DAILY 04/03/16   Magdalen Spatz, NP  nystatin cream (MYCOSTATIN) Apply 1 application topically 2 (two) times daily as needed for dry skin.    Historical Provider, MD  ONE TOUCH ULTRA TEST test strip TEST BLOOD SUGAR TWICE DAILY 06/02/16   Renato Shin, MD  oxybutynin (DITROPAN-XL) 10 MG 24 hr tablet Take 1 tablet (10 mg total) by mouth daily. 02/18/15   Biagio Borg, MD  pantoprazole (PROTONIX) 40 MG tablet Take 1 tablet (40 mg total) by mouth 2 (two) times daily. 05/11/16   Collene Gobble, MD  predniSONE (DELTASONE) 10 MG tablet 40mg  for 3days, 30mg  for 3days, 20mg  for 3days, and then 10mg  for 3days. Patient not taking: Reported on 07/22/2016 06/03/16   Collene Gobble, MD  predniSONE (DELTASONE) 10 MG tablet Take 4 tabs X 2 days, 2 tabs X 2 days, 1 tab X 2 days then stop Patient not taking: Reported on 07/22/2016  07/10/16   Tanda Rockers, MD  sodium chloride (OCEAN) 0.65 % SOLN nasal spray Place 2 sprays into both nostrils 2 (two) times daily.    Historical Provider, MD    Family History Family History  Problem Relation Age of Onset  . Esophageal cancer Brother   . Esophageal cancer Sister     ?  . Colon polyps Brother   . Pancreatic cancer Sister     ?  . Diabetes Sister   . Diabetes      Aunt and Uncle  . Heart disease Maternal Grandfather     Social History Social History  Substance Use Topics  . Smoking status: Former Smoker    Packs/day: 1.00    Years: 20.00    Types: Cigarettes    Quit date: 06/22/1986  . Smokeless tobacco: Never Used  . Alcohol use Yes     Allergies   Ace inhibitors; Penicillins; and Adhesive [tape]   Review of Systems Review of Systems  Constitutional: Negative.  Negative for fever.  Respiratory: Negative.   Cardiovascular: Negative.   Gastrointestinal: Negative.  Negative for abdominal pain.  Genitourinary: Positive for pelvic pain.  Musculoskeletal: Positive for back pain.  All other systems reviewed and are negative.    Physical Exam Triage Vital Signs ED Triage Vitals  Enc Vitals Group     BP 07/22/16 1545 141/68     Pulse Rate 07/22/16 1545 79     Resp 07/22/16 1545 24     Temp 07/22/16 1545 97.9 F (36.6 C)     Temp Source 07/22/16 1545 Oral     SpO2 07/22/16 1545 96 %     Weight --      Height --      Head Circumference --      Peak Flow --      Pain Score 07/22/16 1544 10     Pain Loc --      Pain Edu? --      Excl. in [place]? --    No data found.  Updated Vital Signs BP 141/68 (BP Location: Left Arm)   Pulse 79   Temp 97.9 F (36.6 C) (Oral)   Resp 24   SpO2 96%   Visual Acuity Right Eye Distance:   Left Eye Distance:   Bilateral Distance:    Right Eye Near:   Left Eye Near:    Bilateral Near:     Physical Exam  Constitutional: She is oriented to person, place, and time. She appears well-developed and  well-nourished.  Abdominal: Soft. Bowel sounds are normal.  Musculoskeletal: She exhibits tenderness.       Lumbar back: She exhibits decreased range of motion and tenderness. She exhibits no pain, no spasm and normal pulse.       Back:  Neurological: She is alert and oriented to person, place, and time. No cranial nerve deficit.  Skin: Skin is warm and dry.  Nursing note and vitals reviewed.    UC Treatments / Results  Labs (all labs ordered are listed, but only abnormal results are displayed) Labs Reviewed - No data to display  EKG  EKG Interpretation None       Radiology No results found.  Procedures Procedures (including critical care time)  Medications Ordered in UC Medications  ketorolac (TORADOL) injection 60 mg (60 mg Intramuscular Given 07/22/16 1632)     Initial Impression / Assessment and Plan / UC Course  I have reviewed the triage vital signs and the nursing notes.  Pertinent labs & imaging results that were available during my care of the patient were reviewed by me and considered in my medical decision making (see chart for details).       Final Clinical Impressions(s) / UC Diagnoses   Final diagnoses:  Chronic midline low back pain with sciatica, sciatica laterality unspecified    New Prescriptions New Prescriptions   CYCLOBENZAPRINE (FLEXERIL) 5 MG TABLET    Take 1 tablet (5 mg total) by mouth 3 (three) times daily.     Billy Fischer, MD 07/22/16 [phone]

## 2016-07-22 NOTE — ED Triage Notes (Signed)
Recurrent issues with back.  See s/s

## 2016-07-22 NOTE — Discharge Instructions (Signed)
You must see your orthopedist for further back issues. No more medicines will be given here.

## 2016-07-27 ENCOUNTER — Other Ambulatory Visit: Payer: Self-pay | Admitting: Emergency Medicine

## 2016-07-27 DIAGNOSIS — M48061 Spinal stenosis, lumbar region without neurogenic claudication: Secondary | ICD-10-CM | POA: Diagnosis not present

## 2016-07-30 ENCOUNTER — Ambulatory Visit (INDEPENDENT_AMBULATORY_CARE_PROVIDER_SITE_OTHER): Payer: PPO | Admitting: Endocrinology

## 2016-07-30 VITALS — BP 120/70 | HR 78 | Wt 308.8 lb

## 2016-07-30 DIAGNOSIS — F329 Major depressive disorder, single episode, unspecified: Secondary | ICD-10-CM

## 2016-07-30 DIAGNOSIS — F32A Depression, unspecified: Secondary | ICD-10-CM

## 2016-07-30 LAB — POCT GLYCOSYLATED HEMOGLOBIN (HGB A1C): Hemoglobin A1C: 8.7

## 2016-07-30 NOTE — Progress Notes (Signed)
Pre visit review using our clinic review tool, if applicable. No additional management support is needed unless otherwise documented below in the visit note. 

## 2016-07-30 NOTE — Patient Instructions (Addendum)
Please continue the same insulin (150 units each morning).   On this type of insulin schedule, you should eat meals on a regular schedule.  If a meal is missed or significantly delayed, your blood sugar could go low.   Call us if you go back on the prednisone and blood sugar is high, so we can temporarily increase the insulin.  check your blood sugar twice a day.  vary the time of day when you check, between before the 3 meals, and at bedtime.  also check if you have symptoms of your blood sugar being too high or too low.  please keep a record of the readings and bring it to your next appointment here.  You can write it on any piece of paper.  please call us sooner if your blood sugar goes below 70, or if you have a lot of readings over 200.   Please see a psychology specialist.  you will receive a phone call, about a day and time for an appointment.   Please come back for a follow-up appointment in 3 months.

## 2016-07-30 NOTE — Progress Notes (Signed)
Subjective:    Patient ID: Christy May, female    DOB: 1950-10-21, 66 y.o.   MRN: [id number]  HPI Pt returns for f/u of diabetes mellitus: DM type: Insulin-requiring type 2 Dx'ed: [id number] Complications: polyneuropathy and renal insufficiency.  Therapy: insulin since 2002.  GDM: never.  DKA: never.  Severe hypoglycemia: never.  Pancreatitis: never.  Other: she was changed to a qd insulin regimen, after poor results on multiple daily injections; she wants the cheapest possible insulin.  She declines weight loss surgery.   Interval history: no cbg record, but states cbg's vary from 76-200's.  There is no trend throughout the day.  she says she never misses the insulin.  Pt says cbg's are higher recently, due to prednisone for acute bronchitis and low-back pain.  She expects to be finished with prednisone in 2 days.   She has h/o recurrent severe depression.  It has recurred, but it is mild now.  Denies SI or HI.  Past Medical History:  Diagnosis Date  . Allergic bronchitis   . Allergy   . Anemia, unspecified   . Anxiety   . Arthritis    "neck, back, hands" (09/03/2014)  . Chronic airway obstruction, not elsewhere classified    "I was told I don't have this/tests done @ [facility] 05/2014"  . Chronic back pain   . Colon polyps   . Complication of anesthesia    "I've had recall; I probably stopped breathing at least 2 times" (09/03/2014)  . Dehydration   . Depressive disorder, not elsewhere classified   . Diabetic peripheral neuropathy ([place])   . Dysphagia 2007   historyof dysphagia with severe dysmotility by barium swallow-[name]  . Esophageal reflux   . Extrinsic asthma, unspecified    "I was told I don't have this/tests done @ [facility] 05/2014"  . Family history of adverse reaction to anesthesia    "my brother was told they lost him a couple times during OR"  . Headache    "weekly to monthly" (09/03/2014)  . Heart murmur   . History of hiatal hernia   . Hypertension   .  Inflammatory and toxic neuropathy, unspecified   . Migraine    "couple times/month right now" (09/03/2014)  . Obesity, unspecified   . OSA (obstructive sleep apnea)     CPAP  . Spondylosis of unspecified site without mention of myelopathy   . Type II diabetes mellitus ([place])   . Unspecified essential hypertension   . Unspecified menopausal and postmenopausal disorder   . Unspecified vitamin D deficiency     Past Surgical History:  Procedure Laterality Date  . ABDOMINAL HYSTERECTOMY     "partial"  . ANTERIOR CERVICAL DECOMP/DISCECTOMY FUSION     multiple cervical spine levels;Dr. Arnoldo Morale  . APPENDECTOMY    . BACK SURGERY    . CHOLECYSTECTOMY OPEN    . COLONOSCOPY N/A 05/08/2014   Procedure: COLONOSCOPY;  Surgeon: [name], MD;  Location: [facility];  Service: Endoscopy;  Laterality: N/A;  . DILATION AND CURETTAGE OF UTERUS    . ESOPHAGOGASTRODUODENOSCOPY N/A 05/08/2014   Procedure: ESOPHAGOGASTRODUODENOSCOPY (EGD);  Surgeon: [name], MD;  Location: [name];  Service: Endoscopy;  Laterality: N/A;  . EXPLORATORY LAPAROTOMY    . EYE SURGERY    . FOOT SURGERY Right X 2   "took blood out of my arms and put platelets in my feet"  . INCISION AND DRAINAGE ABSCESS     Chest  . JOINT REPLACEMENT    .  REFRACTIVE SURGERY Bilateral   . TONSILLECTOMY    . TOTAL KNEE ARTHROPLASTY Left 10/2003  . TOTAL KNEE ARTHROPLASTY Right 01/2004   [name]    Social History   Social History  . Marital status: Widowed    Spouse name: N/A  . Number of children: 1  . Years of education: N/A   Occupational History  . disabled Disabled   Social History Main Topics  . Smoking status: Former Smoker    Packs/day: 1.00    Years: 20.00    Types: Cigarettes    Quit date: 06/22/1986  . Smokeless tobacco: Never Used  . Alcohol use Yes  . Drug use: No  . Sexual activity: Not on file   Other Topics Concern  . Not on file   Social History Narrative   ** Merged History Encounter **         Patient does not get regular exercise.      Widowed 2004      Disabled    Current Outpatient Prescriptions on File Prior to Visit  Medication Sig Dispense Refill  . albuterol (PROVENTIL) (2.5 MG/3ML) 0.083% nebulizer solution Take 2.5 mg by nebulization every 6 (six) hours as needed for wheezing or shortness of breath.    . allopurinol (ZYLOPRIM) 300 MG tablet Take 300 mg by mouth daily.    Marland Kitchen aspirin EC 81 MG tablet Take 81 mg by mouth at bedtime.    . chlorpheniramine (CHLORPHEN) 4 MG tablet Take 4 mg by mouth daily.    . cholecalciferol (VITAMIN D) 1000 UNITS tablet Take 2,000 Units by mouth daily.     . cyclobenzaprine (FLEXERIL) 5 MG tablet Take 1 tablet (5 mg total) by mouth 3 (three) times daily. 30 tablet 1  . famotidine (PEPCID) 20 MG tablet TAKE 1 TABLET BY MOUTH EVERY NIGHT AT BEDTIME 30 tablet 0  . fluconazole (DIFLUCAN) 100 MG tablet Take 1 tablet (100 mg total) by mouth daily. 2 tablet 0  . fluticasone (FLONASE) 50 MCG/ACT nasal spray Place 2 sprays into both nostrils daily.    . fluticasone (FLONASE) 50 MCG/ACT nasal spray SHAKE LIQUID AND USE 2 SPRAYS IN EACH NOSTRIL DAILY 16 g 5  . fluticasone (VERAMYST) 27.5 MCG/SPRAY nasal spray Place 2 sprays into the nose daily. 10 g 2  . folic acid (FOLVITE) 1 MG tablet TAKE 4 TABLETS(4 MG) BY MOUTH DAILY 120 tablet 0  . gabapentin (NEURONTIN) 800 MG tablet Take 1 tablet (800 mg total) by mouth 3 (three) times daily. 270 tablet 1  . insulin NPH Human (NOVOLIN N RELION) 100 UNIT/ML injection Inject 1.5 mLs (150 Units total) into the skin every morning. 50 mL 11  . Insulin Syringe-Needle U-100 (INSULIN SYRINGE 1CC/31GX5/16") 31G X 5/16" 1 ML MISC USE TO INJECT INSULIN TWICE DAILY 100 each 0  . ipratropium (ATROVENT) 0.03 % nasal spray Place 2 sprays into both nostrils 3 (three) times daily as needed for rhinitis. 30 mL 5  . irbesartan (AVAPRO) 300 MG tablet Take 300 mg by mouth daily.    Marland Kitchen loratadine (CLARITIN) 10 MG tablet Take  10 mg by mouth daily.    . metoprolol (LOPRESSOR) 50 MG tablet Take 75 mg by mouth 2 (two) times daily.    . montelukast (SINGULAIR) 10 MG tablet TAKE 1 TABLET(10 MG) BY MOUTH DAILY 90 tablet 1  . nystatin cream (MYCOSTATIN) Apply 1 application topically 2 (two) times daily as needed for dry skin.    . ONE TOUCH ULTRA TEST  test strip TEST BLOOD SUGAR TWICE DAILY 100 each 11  . oxybutynin (DITROPAN-XL) 10 MG 24 hr tablet Take 1 tablet (10 mg total) by mouth daily. 90 tablet 1  . pantoprazole (PROTONIX) 40 MG tablet Take 1 tablet (40 mg total) by mouth 2 (two) times daily. 60 tablet 5  . sodium chloride (OCEAN) 0.65 % SOLN nasal spray Place 2 sprays into both nostrils 2 (two) times daily.    . predniSONE (DELTASONE) 10 MG tablet 40mg  for 3days, 30mg  for 3days, 20mg  for 3days, and then 10mg  for 3days. (Patient not taking: Reported on 07/30/2016) 30 tablet 0  . predniSONE (DELTASONE) 10 MG tablet Take 4 tabs X 2 days, 2 tabs X 2 days, 1 tab X 2 days then stop (Patient not taking: Reported on 07/22/2016) 14 tablet 0   No current facility-administered medications on file prior to visit.     Allergies  Allergen Reactions  . Ace Inhibitors Anaphylaxis and Cough  . Penicillins Hives and Other (See Comments)    Has patient had a PCN reaction causing immediate rash, facial/tongue/throat swelling, SOB or lightheadedness with hypotension:  No Has patient had a PCN reaction causing severe rash involving mucus membranes or skin necrosis:  No Has patient had a PCN reaction that required hospitalization No Has patient had a PCN reaction occurring within the last 10 years: No If all of the above answers are "NO", then may proceed with Cephalosporin use.  . Adhesive [Tape] Hives    Family History  Problem Relation Age of Onset  . Esophageal cancer Brother   . Esophageal cancer Sister     ?  . Colon polyps Brother   . Pancreatic cancer Sister     ?  . Diabetes Sister   . Diabetes      Aunt and Uncle  .  Heart disease Maternal Grandfather     BP 120/70 (BP Location: Left Arm, Patient Position: Sitting, Cuff Size: Normal)   Pulse 78   Wt (!) 308 lb 12.8 oz (140.1 kg)   SpO2 96%   BMI 48.01 kg/m    Review of Systems She is not sure if she has had hypoglycemia.  If so, it has been mild.      Objective:   Physical Exam VITAL SIGNS:  See vs page GENERAL: no distress Pulses: dorsalis pedis intact bilat.   MSK: no deformity of the feet CV: 1+ bilat leg edema Skin:  no ulcer on the feet, but the skin is dry.  normal color and temp on the feet. Neuro: sensation is intact to touch on the feet.    Lab Results  Component Value Date   HGBA1C 8.7 07/30/2016      Assessment & Plan:  Insulin-requiring type 2 DM, with renal insufficiency: worse, prob due to steroids.   Acute bronchitis and back pain: steroids. As she is almost done with the course of rx, we'll continue the same insulin for now.   Depression, persistent.   Patient is advised the following: Patient Instructions  Please continue the same insulin (150 units each morning).   On this type of insulin schedule, you should eat meals on a regular schedule.  If a meal is missed or significantly delayed, your blood sugar could go low.   Call us if you go back on the prednisone and blood sugar is high, so we can temporarily increase the insulin.  check your blood sugar twice a day.  vary the time of day when you check,  between before the 3 meals, and at bedtime.  also check if you have symptoms of your blood sugar being too high or too low.  please keep a record of the readings and bring it to your next appointment here.  You can write it on any piece of paper.  please call us sooner if your blood sugar goes below 70, or if you have a lot of readings over 200.   Please see a psychology specialist.  you will receive a phone call, about a day and time for an appointment.   Please come back for a follow-up appointment in 3 months.

## 2016-08-05 ENCOUNTER — Telehealth: Payer: Self-pay | Admitting: Endocrinology

## 2016-08-05 NOTE — Telephone Encounter (Signed)
Please increase NPH to 170 units qam Call in 5-7 days, to report cbg's

## 2016-08-05 NOTE — Telephone Encounter (Signed)
I contacted the patient and advised of message. Pateint voiced understanding and had no further questions at this time.  

## 2016-08-05 NOTE — Telephone Encounter (Signed)
Pt's last dose was on Saturday of the prednisone. Pt sugar today was 136, yesterday 199 in AM late afternoon 350; 2/12 119 AM

## 2016-08-05 NOTE — Telephone Encounter (Signed)
See message and please advise, Thanks!  

## 2016-08-12 ENCOUNTER — Telehealth: Payer: Self-pay | Admitting: Endocrinology

## 2016-08-12 NOTE — Telephone Encounter (Signed)
I need more details, to see what is needed. What is the progress of ref to psychologist?

## 2016-08-12 NOTE — Telephone Encounter (Signed)
Patient is having a rough time and will like to see if she can get aid to come out and help her. Please advise

## 2016-08-12 NOTE — Telephone Encounter (Signed)
See message and please advise, Thanks!  

## 2016-08-13 ENCOUNTER — Encounter: Payer: Self-pay | Admitting: Endocrinology

## 2016-08-13 NOTE — Telephone Encounter (Signed)
I contacted the patient. She stated she was set up with the psychologist but  her back went out. Patient stated she would call the office back to set her appointment up today. Patient stated she believes she needs an aid because she is struggling with daily activities such as bathing and cooking. Paitent stated when her back is in pain and she is not feeling well she gets depressed because she cannot do for her self and would really like for someone to come out and help her if possible.  Please advise, Thanks!

## 2016-08-13 NOTE — Telephone Encounter (Addendum)
I contacted the patient and advised of instructions. Patient voiced understanding and had no further questions at this time.  

## 2016-08-13 NOTE — Telephone Encounter (Signed)
PCP is Dr Criss Rosales.  Please ask pt to ask her.

## 2016-08-18 ENCOUNTER — Ambulatory Visit (INDEPENDENT_AMBULATORY_CARE_PROVIDER_SITE_OTHER): Payer: PPO | Admitting: Podiatry

## 2016-08-18 ENCOUNTER — Encounter: Payer: Self-pay | Admitting: Podiatry

## 2016-08-18 VITALS — Ht 67.25 in | Wt 308.0 lb

## 2016-08-18 DIAGNOSIS — M2042 Other hammer toe(s) (acquired), left foot: Secondary | ICD-10-CM

## 2016-08-18 DIAGNOSIS — B351 Tinea unguium: Secondary | ICD-10-CM

## 2016-08-18 DIAGNOSIS — M79676 Pain in unspecified toe(s): Secondary | ICD-10-CM | POA: Diagnosis not present

## 2016-08-18 DIAGNOSIS — M2032 Hallux varus (acquired), left foot: Secondary | ICD-10-CM

## 2016-08-18 DIAGNOSIS — M2041 Other hammer toe(s) (acquired), right foot: Secondary | ICD-10-CM

## 2016-08-18 DIAGNOSIS — E1342 Other specified diabetes mellitus with diabetic polyneuropathy: Secondary | ICD-10-CM

## 2016-08-18 NOTE — Progress Notes (Signed)
Patient ID: Christy May, female   DOB: December 08, 1950, 65 y.o.   MRN: [id number] Complaint:  Visit Type: Patient returns to my office for continued preventative foot care services. Complaint: Patient states" my nails have grown long and thick and become painful to walk and wear shoes" Patient has been diagnosed with DM with hammer toes and capsulitis right foot.. The patient presents for preventative foot care services. No changes to ROS.  She also has painful callus under the ball of her left foot.    Podiatric Exam: Vascular: dorsalis pedis and posterior tibial pulses are palpable right foot and diminished left foot.. Capillary return is immediate. Temperature gradient is WNL. Skin turgor WNL  Sensorium: Normal Semmes Weinstein monofilament test. Normal tactile sensation bilaterally. Nail Exam: Pt has thick disfigured discolored nails with subungual debris noted bilateral entire nail hallux through fifth toenails Ulcer Exam: There is no evidence of ulcer or pre-ulcerative changes or infection. Orthopedic Exam: Muscle tone and strength are WNL. No limitations in general ROM. No crepitus or effusions noted. Foot type and digits show no abnormalities. Hallux varus left foot.  Hammer toes 3,4,5 right foot. Skin:  Porokeratosis sub 2 left.. No infection or ulcers  Diagnosis:  Onychomycosis, , Pain in right toe, pain in left toes,  Diabetic neuropathy  Hammer toes 3,4,5 right foot.  Hallux varus left foot.  Treatment & Plan Procedures and Treatment: Consent by patient was obtained for treatment procedures. The patient understood the discussion of treatment and procedures well. All questions were answered thoroughly reviewed. Debridement of mycotic and hypertrophic toenails, 1 through 5 bilateral and clearing of subungual debris. No ulceration, no infection noted. .  Hammer toes 3,4,5 right foot.  Hallux varus 1st MPJ left foot. Initiate diabetic shoe paperwork for DPN and hallux varus and hammer  toes. Return Visit-Office Procedure: Patient instructed to return to the office for a follow up visit 3 months for continued evaluation and treatment.    Gardiner Barefoot DPM

## 2016-08-19 ENCOUNTER — Ambulatory Visit (INDEPENDENT_AMBULATORY_CARE_PROVIDER_SITE_OTHER): Payer: PPO | Admitting: Emergency Medicine

## 2016-08-19 ENCOUNTER — Encounter: Payer: Self-pay | Admitting: Emergency Medicine

## 2016-08-19 DIAGNOSIS — J301 Allergic rhinitis due to pollen: Secondary | ICD-10-CM | POA: Diagnosis not present

## 2016-08-19 DIAGNOSIS — G4733 Obstructive sleep apnea (adult) (pediatric): Secondary | ICD-10-CM

## 2016-08-19 DIAGNOSIS — J383 Other diseases of vocal cords: Secondary | ICD-10-CM

## 2016-08-19 MED ORDER — PREDNISONE 20 MG PO TABS: ORAL_TABLET | ORAL | 0 refills | Status: DC

## 2016-08-19 MED ORDER — PREDNISONE 20 MG PO TABS: 20.0000 mg | ORAL_TABLET | Freq: Every day | ORAL | 5 refills | Status: DC

## 2016-08-19 MED ORDER — HYDROCODONE-HOMATROPINE 5-1.5 MG/5ML PO SYRP: 5.0000 mL | ORAL_SOLUTION | Freq: Every evening | ORAL | 0 refills | Status: DC

## 2016-08-19 MED ORDER — HYDROCODONE-HOMATROPINE 5-1.5 MG/5ML PO SYRP: 5.0000 mL | ORAL_SOLUTION | Freq: Four times a day (QID) | ORAL | 0 refills | Status: DC | PRN

## 2016-08-19 NOTE — Assessment & Plan Note (Signed)
Treated maximally -- continue same regimen.

## 2016-08-19 NOTE — Assessment & Plan Note (Signed)
Very difficult situation and very little to offer here besides what she wants which is prednisone. Her VCD is compounded by rhinitis, poorly treated OSA, depression, probably a recent URI. Also she has not been able to get codeine for cough suppression for the last 2 months which has thrown he sx out of control. I have tried to treat all underlying contributors and to avoid codeine (she denies abuse but pharmacy documented this).   Only recourse here other than referral for 2nd opinion is to treat her sx, try to establish a stable baseline, then see if we can scale back on therapy.   Please start prednisone as directed:  Take 40mg  daily for 3 days, then 30mg  daily for 3 days, then 20mg  daily thereafter  Take hycodan 5cc every evening to suppress your cough. We will not refill this over the phone, only at your office visit.  Continue your other medications as you have been taking them.  Restart your CPAP every night.  Follow with Dr Lamonte Sakai in 3 months or sooner if you have any problems.

## 2016-08-19 NOTE — Telephone Encounter (Signed)
I contacted the patient and she stated she has been taking 170 units of the Novolin N. Please advise, Thanks!

## 2016-08-19 NOTE — Patient Instructions (Addendum)
Please start prednisone as directed:  Take 40mg  daily for 3 days, then 30mg  daily for 3 days, then 20mg  daily thereafter  Take hycodan 5cc every evening to suppress your cough. We will not refill this over the phone, only at your office visit.  Continue your other medications as you have been taking them.  Restart your CPAP every night.  Follow with Dr Lamonte Sakai in 3 months or sooner if you have any problems.

## 2016-08-19 NOTE — Telephone Encounter (Signed)
Please verify NPH insulin is 150 units each morning. Then please decrease to 120 units qam Call if lows persist

## 2016-08-19 NOTE — Telephone Encounter (Signed)
Ok, Then please decrease to 140 units qam

## 2016-08-19 NOTE — Telephone Encounter (Signed)
See message and please advise, Thanks!  

## 2016-08-19 NOTE — Telephone Encounter (Signed)
I contacted the patient and advised of message. Patient voiced understanding and had no further questions and will call and report blood sugar readings if she continues to have low numbers.

## 2016-08-19 NOTE — Progress Notes (Signed)
Subjective:    Patient ID: Christy May, female    DOB: May 15, 1951, 66 y.o.   MRN: [id number]  HPI 66 yo woman, former smoker with obesity, OSA, HTN on irbesartan, DM, childhood asthma. Carries a dx of adult asthma although PFT 2015 most consistent w restriction and variable UA irritation on inspiratory loop. She has done speech rx and training for VCD before. Contributing factors are chronic rhinitis, GERD. She has felt that albuterol has helped her in the past.   She is on rx for  GERD and allergic rhinitis. Still has breakthrough GERD sx. She has done speech therapy for VCD and chronic cough before. Has been rx with aggressive allergy regimen, Nexium bid. She has been dealing with a flare of cough and dyspnea for last 7 weeks. Was treated with azithro + Pred + hycodan, then more recently again with dexamethasone, finishing taper now. She has not been able to get nexium bid, was temporarily off flonase due to concern that it was contributing to cough. She feels that when she urinates it sends her signal to cough (she used to have stress / cough incontinence). She states that she is having significant drainage from nose.   Current meds: Nexium 40 qd (increased to 40 bid recently, but could through insurance), singulair, flonase / veramyst, chlorpheniramine, gabapentin, loratadine, zantac, ocean spray. Fish oil.   Acute OV 06/03/16 -- This is an acute office visit for patient well known to me with a history of adult asthma and upper airway irritation syndrome/vocal cord dysfunction. She also has obstructive sleep apnea, hypertension, diabetes. Chronic cough that is being driven by chronic rhinitis and GERD. I last saw her on 05/11/16, I added pantoprazole to Zantac. She continued gabapentin, Singulair, Flonase, chlorpheniramine, nasal saline washes. She was also on dexamethasone and I asked her to continue this. I added Atrovent nasal spray. Since that visit I was notified by the pharmacy that she  had been overprescribed cough syrup with codeine: 800 mL in 6-7 week time period, prescriptions filled from 7 different providers. Based on this I informed her that she would be dismissed from th practice. She sent me a personal letter to explain the situation and informed me that this was a misunderstanding.   She presents today describing new onset nasal congestion, nausea, cough, HA, chest discomfort beginning about 3-4 days ago. Her dexamethasone ended few weeks ago. She may have had sick contacts at church.   ROV 08/19/16 -- this follow-up visit for patient with a history of upper airway irritation and vocal cord dysfunction. She likely also has some superimposed asthma. She has obstructive sleep apnea, hypertension, diabetes, chronic rhinitis, GERD.  She tells me that she has been having problems for the last 2 months - she has been off of her narcotic cough medication for this time, has more cough. In the interim she believes that she caught a URI. She has seen Dr Criss Rosales regarding her sx in early January, she understandably has hesitation prescribing pred or narcotic cough syrup because the pt has misused these. She saw her ortho MD for back pain, was treated with a pred taper - made her better, less cough and wheeze, less nasal congestion. She has been off her CPAP since early January due to eye infection and eye surgery.     Review of Systems As per HPI     Objective:   Physical Exam Vitals:   08/19/16 1657  BP: 138/72  BP Location: Left Arm  Patient  Position: Sitting  Cuff Size: Large  Pulse: 72  SpO2: 98%  Weight: (!) 310 lb (140.6 kg)  Height: 5' 7.25" (1.708 m)    GEN: A/Ox3; depressed affect, obese woman in no distress   HEENT:  OP clear, hoarse voice. No postnasal drip evident  NECK:  No stridor  RESP  Clear bilaterally, no wheeze on forced expiration  CARD:  RRR, no m/r/g, trace edema.   Musco: Warm bil, no deformities or joint swelling noted.   Neuro: alert, no  focal deficits noted.    Skin: Warm, no lesions or rashes       Assessment & Plan:  Vocal cord dysfunction Very difficult situation and very little to offer here besides what she wants which is prednisone. Her VCD is compounded by rhinitis, poorly treated OSA, depression, probably a recent URI. Also she has not been able to get codeine for cough suppression for the last 2 months which has thrown he sx out of control. I have tried to treat all underlying contributors and to avoid codeine (she denies abuse but pharmacy documented this).   Only recourse here other than referral for 2nd opinion is to treat her sx, try to establish a stable baseline, then see if we can scale back on therapy.   Please start prednisone as directed:  Take 40mg  daily for 3 days, then 30mg  daily for 3 days, then 20mg  daily thereafter  Take hycodan 5cc every evening to suppress your cough. We will not refill this over the phone, only at your office visit.  Continue your other medications as you have been taking them.  Restart your CPAP every night.  Follow with Dr Lamonte Sakai in 3 months or sooner if you have any problems.  Allergic rhinitis Treated maximally -- continue same regimen.   Sleep apnea Discussed with her that we need to restart her CPAP/   Baltazar Apo, MD, PhD 08/19/2016, 5:23 PM [facility] [phone] or if no answer [phone]

## 2016-08-19 NOTE — Telephone Encounter (Signed)
Patient b/s have been running low This morning 53, mon 86, Tue 84,  She took a Glucerna tab and drank a naked shake, it went up to 76, she ate a little bit and it went up to 127. Please call patient stated

## 2016-08-19 NOTE — Assessment & Plan Note (Signed)
Discussed with her that we need to restart her CPAP/

## 2016-08-20 NOTE — Telephone Encounter (Signed)
See message and please advise, Thanks!  

## 2016-08-20 NOTE — Telephone Encounter (Signed)
I contacted the patient and advised of message. Patient voiced understanding and will call back tomorrow to report blood sugar readings

## 2016-08-20 NOTE — Telephone Encounter (Signed)
Please continue 140 units qam Call tomorrow to report cbg's

## 2016-08-20 NOTE — Telephone Encounter (Signed)
went to her Pulmonary Dr he put her on steroids, this morning her b/s was in the 60ties later it was 487. Please advise

## 2016-08-25 ENCOUNTER — Telehealth: Payer: Self-pay | Admitting: Emergency Medicine

## 2016-08-25 DIAGNOSIS — I517 Cardiomegaly: Secondary | ICD-10-CM | POA: Diagnosis not present

## 2016-08-25 DIAGNOSIS — E1342 Other specified diabetes mellitus with diabetic polyneuropathy: Secondary | ICD-10-CM | POA: Diagnosis not present

## 2016-08-25 DIAGNOSIS — I10 Essential (primary) hypertension: Secondary | ICD-10-CM | POA: Diagnosis not present

## 2016-08-25 DIAGNOSIS — I110 Hypertensive heart disease with heart failure: Secondary | ICD-10-CM | POA: Diagnosis not present

## 2016-08-25 NOTE — Telephone Encounter (Signed)
Spoke with pt. She is needing clarification on how to take prednisone. Clarification has been given to the pt per RB AVS instructions. I made sure that the pt was clear on how to take this prescription before I hung up with her. Nothing further was needed.

## 2016-09-10 ENCOUNTER — Telehealth: Payer: Self-pay | Admitting: Podiatry

## 2016-09-10 NOTE — Telephone Encounter (Signed)
Pt would like to find out the status on her diabetic shoes. Dr. Criss Rosales has not received paperwork.

## 2016-09-15 ENCOUNTER — Ambulatory Visit: Payer: PPO | Admitting: Psychiatry

## 2016-09-24 DIAGNOSIS — E119 Type 2 diabetes mellitus without complications: Secondary | ICD-10-CM | POA: Diagnosis not present

## 2016-09-24 DIAGNOSIS — M797 Fibromyalgia: Secondary | ICD-10-CM | POA: Diagnosis not present

## 2016-09-24 DIAGNOSIS — I10 Essential (primary) hypertension: Secondary | ICD-10-CM | POA: Diagnosis not present

## 2016-09-24 DIAGNOSIS — I110 Hypertensive heart disease with heart failure: Secondary | ICD-10-CM | POA: Diagnosis not present

## 2016-09-24 DIAGNOSIS — E1342 Other specified diabetes mellitus with diabetic polyneuropathy: Secondary | ICD-10-CM | POA: Diagnosis not present

## 2016-09-28 ENCOUNTER — Ambulatory Visit (INDEPENDENT_AMBULATORY_CARE_PROVIDER_SITE_OTHER)
Admission: RE | Admit: 2016-09-28 | Discharge: 2016-09-28 | Disposition: A | Discharge status: Home / Self Care | Payer: PPO | Source: Ambulatory Visit | Attending: Internal Medicine | Admitting: Internal Medicine

## 2016-09-28 ENCOUNTER — Encounter: Payer: Self-pay | Admitting: Internal Medicine

## 2016-09-28 ENCOUNTER — Ambulatory Visit (INDEPENDENT_AMBULATORY_CARE_PROVIDER_SITE_OTHER): Payer: PPO | Admitting: Internal Medicine

## 2016-09-28 VITALS — BP 120/60 | HR 88 | Temp 98.7°F | Ht 67.25 in | Wt 312.0 lb

## 2016-09-28 DIAGNOSIS — R05 Cough: Secondary | ICD-10-CM | POA: Insufficient documentation

## 2016-09-28 DIAGNOSIS — R058 Other specified cough: Secondary | ICD-10-CM

## 2016-09-28 DIAGNOSIS — R0602 Shortness of breath: Secondary | ICD-10-CM | POA: Diagnosis not present

## 2016-09-28 MED ORDER — TRAMADOL HCL 50 MG PO TABS: ORAL_TABLET | ORAL | 0 refills | Status: DC

## 2016-09-28 NOTE — Progress Notes (Signed)
Subjective:    Patient ID: Christy May, female    DOB: 01/21/1951, 66 y.o.   MRN: [id number]  HPI 66 yo woman, quit smoking 1988  with obesity, OSA, HTN on irbesartan, DM, childhood asthma. Carries a dx of adult asthma although PFT 2015 most consistent w restriction and variable UA irritation on inspiratory loop. She has done speech rx and training for VCD before. Contributing factors are chronic rhinitis, GERD. She has felt that albuterol has helped her in the past.    ROV Christy May  08/19/16 -- this follow-up visit for patient with a history of upper airway irritation and vocal cord dysfunction. She likely also has some superimposed asthma. She has obstructive sleep apnea, hypertension, diabetes, chronic rhinitis, GERD.  She tells me that she has been having problems for the last 2 months - she has been off of her narcotic cough medication for this time, has more cough. In the interim she believes that she caught a URI. She has seen Christy May regarding her sx in early January, she understandably has hesitation prescribing pred or narcotic cough syrup because the pt has misused these. She saw her ortho MD for back pain, was treated with a pred taper - made her better, less cough and wheeze, less nasal congestion. She has been off her CPAP since early January due to eye infection and eye surgery.  rec Please start prednisone as directed:  Take 40mg  daily for 3 days, then 30mg  daily for 3 days, then 20mg  daily thereafter  Take hycodan 5cc every evening to suppress your cough. We will not refill this over the phone, only at your office visit.  Continue your other medications as you have been taking them.     09/28/2016 acute extended ov/Christy May re: acutely worse cough Chief Complaint  Patient presents with  . Acute Visit    Pt c/o increased SOB, cough, HA, watery eyes x 3 days. She is coughing up some clear sputum.   after last ov with Christy May, got 100% better and able to stop the cough medication  completely for a few weeks while prednisone 20 mg daily  then "Caught cold" at visit 09/24/16 with PCP and started bad cough again > clear mucus, sob/ gen HA     Not taking protonix ac but maintain on singulair / not  needing any nebulizer saba  Chlorpheniramine seems to help sense of pnds but only taking 4 mg / 24 h    No obvious day to day or daytime variability or assoc  purulent sputum or mucus plugs or hemoptysis or cp or chest tightness, subjective wheeze or overt sinus or hb symptoms. No unusual exp hx or h/o childhood pna/ asthma or knowledge of premature birth.  Sleeping ok without nocturnal  or early am exacerbation  of respiratory  c/o's or need for noct saba. Also denies any obvious fluctuation of symptoms with weather or environmental changes or other aggravating or alleviating factors except as outlined above   Current Medications, Allergies, Complete Past Medical History, Past Surgical History, Family History, and Social History were reviewed in [employer] record.  ROS  The following are not active complaints unless bolded sore throat, dysphagia, dental problems, itching, sneezing,  nasal congestion or excess/ purulent secretions, ear ache,   fever, chills, sweats, unintended wt loss, classically pleuritic or exertional cp,  orthopnea pnd or leg swelling, presyncope, palpitations, abdominal pain, anorexia, nausea, vomiting, diarrhea  or change in bowel or bladder habits, change  in stools or urine, dysuria,hematuria,  rash, arthralgias, visual complaints, headache, numbness, weakness or ataxia or problems with walking or coordination,  change in mood/affect or memory.                   Objective:   Physical Exam   amb very anxious obese hoarse  bf nad  Wt Readings from Last 3 Encounters:  09/28/16 (!) 312 lb (141.5 kg)  08/19/16 (!) 310 lb (140.6 kg)  08/18/16 (!) 308 lb (139.7 kg)    Vital signs reviewed  - Note on arrival 02 sats  91% on RA       HEENT: nl dentition, turbinates bilaterally, and oropharynx. Nl external ear canals without cough reflex   NECK :  without JVD/Nodes/TM/ nl carotid upstrokes bilaterally   LUNGS: no acc muscle use,  Nl contour chest which is clear to A and P bilaterally without cough on insp or exp maneuvers/ pseudowheezing only   CV:  RRR  no s3 or murmur or increase in P2, and no edema   ABD:  soft and nontender with nl inspiratory excursion in the supine position. No bruits or organomegaly appreciated, bowel sounds nl  MS:  Nl gait/ ext warm without deformities, calf tenderness, cyanosis or clubbing No obvious joint restrictions   SKIN: warm and dry without lesions    NEURO:  alert, approp, nl sensorium with  no motor or cerebellar deficits apparent.      CXR PA and Lateral:   09/28/2016 :    I personally reviewed images and agree with radiology impression as follows:    no acute findings          Assessment & Plan:

## 2016-09-28 NOTE — Patient Instructions (Addendum)
For drainage / throat tickle try take CHLORPHENIRAMINE  4 mg - take one-two  every 4 hours as needed - available over the counter- may cause drowsiness so start with just a bedtime dose or two and see how you tolerate it before trying in daytime    Continue prednisone 20 mg daily with breakfast  Be sure to take your protonix 40 mg Take 30- 60 min before your first and last meals of the day    GERD (REFLUX)  is an extremely common cause of respiratory symptoms just like yours , many times with no obvious heartburn at all.    It can be treated with medication, but also with lifestyle changes including elevation of the head of your bed (ideally with 6 inch  bed blocks),  Smoking cessation, avoidance of late meals, excessive alcohol, and avoid fatty foods, chocolate, peppermint, colas, red wine, and acidic juices such as orange juice.  NO MINT OR MENTHOL PRODUCTS SO NO COUGH DROPS   USE SUGARLESS CANDY INSTEAD (Jolley ranchers or Stover's or Life Savers) or even ice chips will also do - the key is to swallow to prevent all throat clearing. NO OIL BASED VITAMINS - use powdered substitutes.    Take delsym two tsp every 12 hours and supplement if needed with  tramadol 50 mg up to 1-2  every 4 hours to suppress the urge to cough. Swallowing water or using ice chips/non mint and menthol containing candies (such as lifesavers or sugarless jolly ranchers) are also effective.  You should rest your voice and avoid activities that you know make you cough.  Please remember to go to the  x-ray department downstairs in the basement  for your tests - we will call you with the results when they are available.     Once you have eliminated the cough for 3 straight days try reducing the tramadol first,  then the delsym as tolerated.

## 2016-09-29 NOTE — Progress Notes (Signed)
Spoke with pt and notified of results per Dr. Wert. Pt verbalized understanding and denied any questions. 

## 2016-09-29 NOTE — Assessment & Plan Note (Signed)
Body mass index is 48.5 kg/m.  trending up Lab Results  Component Value Date   TSH 3.20 07/25/2014     Contributing to gerd risk/ doe/reviewed the need and the process to achieve and maintain neg calorie balance > defer f/u primary care including intermittently monitoring thyroid status

## 2016-09-29 NOTE — Assessment & Plan Note (Signed)
No evidence at all to support asthma flare here and clearly Symptoms are markedly disproportionate to objective findings and not clear this is even a lung problem but pt does appear to have difficult airway management issues.   DDX of  difficult airways management almost all start with A and  include Adherence, Ace Inhibitors, Acid Reflux, Active Sinus Disease, Alpha 1 Antitripsin deficiency, Anxiety masquerading as Airways dz,  ABPA,  Allergy(esp in young), Aspiration (esp in elderly), Adverse effects of meds,  Active smokers, A bunch of PE's (a small clot burden can't cause this syndrome unless there is already severe underlying pulm or vascular dz with poor reserve) plus two Bs  = Bronchiectasis and Beta blocker use..and one C= CHF   Adherence is always the initial "prime suspect" and is a multilayered concern that requires a "trust but verify" approach in every patient - starting with knowing how to use medications, especially inhalers, correctly, keeping up with refills and understanding the fundamental difference between maintenance and prns vs those medications only taken for a very short course and then stopped and not refilled.  - rec return with all meds in hand using a trust but verify approach to confirm accurate Medication  Reconciliation The principal here is that until we are certain that the  patients are doing what we've asked, it makes no sense to ask them to do more.   ? Acid (or non-acid) GERD > always difficult to exclude as up to 75% of pts in some series report no assoc GI/ Heartburn symptoms> rec continue max (24h)  acid suppression and diet restrictions/ reviewed     ? Anxiety > usually at the bottom of this list of usual suspects but should be much higher on this pt's based on H and P  > Follow up per Primary Care planned     ? Allergy / asthma > should be more than adequately covered on pred po and singulair > consider taper off pred per Dr Lamonte Sakai  ? Active sinus dz/ rhinitis  > rx 1st gen h1 up to 4mg  x 2 every 4 hours if needed   ? BB effects :  Since not really saba dependent probably ok to continue moderate doses of lopressor but should asthma become more problematic Strongly prefer in this setting: Bystolic, the most beta -1  selective Beta blocker available in sample form, with bisoprolol the most selective generic choice  on the market.   I had an extended discussion with the patient reviewing all relevant studies completed to date and  lasting 25 minutes of a 40  minute acute visit with pt not previously known to me with severe   non-specific but potentially very serious refractory respiratory symptoms of unknown etiology.  Each maintenance medication was reviewed in detail including most importantly the difference between maintenance and prns and under what circumstances the prns are to be triggered using an action plan format that is not reflected in the computer generated alphabetically organized AVS.    Please see AVS for specific instructions unique to this office visit that I personally wrote and verbalized to the the pt in detail and then reviewed with pt  by my nurse highlighting any changes in therapy/plan of care  recommended at today's visit.

## 2016-10-01 ENCOUNTER — Other Ambulatory Visit: Payer: Self-pay | Admitting: Family Medicine

## 2016-10-01 DIAGNOSIS — Z1231 Encounter for screening mammogram for malignant neoplasm of breast: Secondary | ICD-10-CM

## 2016-10-05 DIAGNOSIS — H2511 Age-related nuclear cataract, right eye: Secondary | ICD-10-CM | POA: Diagnosis not present

## 2016-10-05 DIAGNOSIS — H25811 Combined forms of age-related cataract, right eye: Secondary | ICD-10-CM | POA: Diagnosis not present

## 2016-10-08 ENCOUNTER — Telehealth: Payer: Self-pay | Admitting: Endocrinology

## 2016-10-08 NOTE — Telephone Encounter (Signed)
See message and please advise, Thanks!  

## 2016-10-08 NOTE — Telephone Encounter (Signed)
Patient stated that her b/s has been dropping, 65, 45 please advise

## 2016-10-08 NOTE — Telephone Encounter (Signed)
Ok, so please reduce to 120 units qam.

## 2016-10-08 NOTE — Telephone Encounter (Signed)
Patient verbalized understanding and had no further questions at this time.

## 2016-10-08 NOTE — Telephone Encounter (Signed)
Please verify the insulin is 150 units each morning.   Then please reduce to 130 units each morning. I'll see you next time.

## 2016-10-08 NOTE — Telephone Encounter (Signed)
I contacted the patient and advised of message. She stated at this time she is taking 140 units daily and had recently been put back on prednisone.   Please advise, Thanks!

## 2016-10-13 ENCOUNTER — Other Ambulatory Visit: Payer: Self-pay | Admitting: Internal Medicine

## 2016-10-13 DIAGNOSIS — R058 Other specified cough: Secondary | ICD-10-CM

## 2016-10-13 DIAGNOSIS — R05 Cough: Secondary | ICD-10-CM

## 2016-10-13 NOTE — Telephone Encounter (Signed)
MW- this is RB's pt You prescribed this last ov with you 09/28/16  She has f/u with RB on 5/23 Please advise if ok to refill this, thanks

## 2016-10-16 ENCOUNTER — Telehealth: Payer: Self-pay | Admitting: Emergency Medicine

## 2016-10-16 NOTE — Telephone Encounter (Signed)
RB  Please Advise-  Pt called in requesting a refill of her cough syrup. She states she forgot to ask Dr. Melvyn Novas to refill it at her last visit. MW prescribed tramadol on 10/13/16 but she states it does not help her. Pt denied any new breathing problems.    HYDROcodone-homatropine (HYCODAN) 5-1.5 MG/5ML syrup [[id number]]  Order Details  Dose: 5 mL Route: Oral Frequency: Every evening  Dispense Quantity:  240 mL Refills:  0 Fills remaining:  --        Sig: Take 5 mLs by mouth every evening.  Patient not taking: Reported on 09/28/2016       Written Date:  08/19/16 Expiration Date:  10/18/16    Start Date:  08/19/16

## 2016-10-19 MED ORDER — HYDROCODONE-HOMATROPINE 5-1.5 MG/5ML PO SYRP
5.0000 mL | ORAL_SOLUTION | Freq: Every evening | ORAL | 0 refills | Status: DC
Start: 2016-10-19 — End: 2016-11-19

## 2016-10-19 NOTE — Telephone Encounter (Signed)
Ok with me to refill

## 2016-10-19 NOTE — Telephone Encounter (Signed)
Patient returned phone, patient contact # [phone].Christy May

## 2016-10-19 NOTE — Telephone Encounter (Signed)
rx printed and left up front for pickup. Pt aware.  Nothing further needed.  

## 2016-10-19 NOTE — Telephone Encounter (Signed)
Pt calling to check status of hycodan cough syrup- I advised that we were awaiting RB's approval, and that we would call her as soon as we hear back from [place].  Pt is requesting this refill today if possible.  RB please advise on refill on hycodan cough syrup.  Thanks.

## 2016-10-20 ENCOUNTER — Ambulatory Visit
Admission: RE | Admit: 2016-10-20 | Discharge: 2016-10-20 | Disposition: A | Discharge status: Home / Self Care | Payer: PPO | Source: Ambulatory Visit | Attending: Family Medicine | Admitting: Family Medicine

## 2016-10-20 DIAGNOSIS — Z1231 Encounter for screening mammogram for malignant neoplasm of breast: Secondary | ICD-10-CM | POA: Diagnosis not present

## 2016-10-22 ENCOUNTER — Ambulatory Visit: Payer: Self-pay | Admitting: Psychiatry

## 2016-10-27 ENCOUNTER — Ambulatory Visit: Payer: PPO | Admitting: Endocrinology

## 2016-11-05 DIAGNOSIS — M797 Fibromyalgia: Secondary | ICD-10-CM | POA: Diagnosis not present

## 2016-11-05 DIAGNOSIS — E1342 Other specified diabetes mellitus with diabetic polyneuropathy: Secondary | ICD-10-CM | POA: Diagnosis not present

## 2016-11-05 DIAGNOSIS — I10 Essential (primary) hypertension: Secondary | ICD-10-CM | POA: Diagnosis not present

## 2016-11-06 ENCOUNTER — Ambulatory Visit (INDEPENDENT_AMBULATORY_CARE_PROVIDER_SITE_OTHER): Payer: PPO | Admitting: Endocrinology

## 2016-11-06 ENCOUNTER — Encounter: Payer: Self-pay | Admitting: Endocrinology

## 2016-11-06 VITALS — BP 126/76 | HR 86 | Wt 316.4 lb

## 2016-11-06 DIAGNOSIS — E1122 Type 2 diabetes mellitus with diabetic chronic kidney disease: Secondary | ICD-10-CM

## 2016-11-06 DIAGNOSIS — N183 Chronic kidney disease, stage 3 unspecified: Secondary | ICD-10-CM

## 2016-11-06 DIAGNOSIS — Z794 Long term (current) use of insulin: Secondary | ICD-10-CM

## 2016-11-06 LAB — POCT GLYCOSYLATED HEMOGLOBIN (HGB A1C): Hemoglobin A1C: 7.9

## 2016-11-06 MED ORDER — INSULIN NPH (HUMAN) (ISOPHANE) 100 UNIT/ML ~~LOC~~ SUSP: 110.0000 [IU] | SUBCUTANEOUS | 11 refills | Status: DC

## 2016-11-06 NOTE — Progress Notes (Signed)
Subjective:    Patient ID: Christy May, female    DOB: 10-Jul-1950, 66 y.o.   MRN: [id number]  HPI Pt returns for f/u of diabetes mellitus: DM type: Insulin-requiring type 2 Dx'ed: 1191 Complications: polyneuropathy and renal insufficiency.  Therapy: insulin since 2002.  GDM: never.  DKA: never.  Severe hypoglycemia: never.  Pancreatitis: never.  Other: she was changed to a qd insulin regimen, after poor results on multiple daily injections; she wants the cheapest possible insulin.  She declines weight loss surgery.   Interval history: no cbg record, but states cbg's vary from 76-200's.  There is no trend throughout the day.  she says she never misses the insulin.  Pt says cbg's are higher recently, due to prednisone for acute bronchitis and low-back pain.  She back on prednisone (now 20 mg qd).  She now takes 120 units qd.  no cbg record, but states cbg's are often mildly low.  This can happen any time of day, but is usually low fasting (62 this am), or if a meal is missed.   Past Medical History:  Diagnosis Date  . Allergic bronchitis   . Allergy   . Anemia, unspecified   . Anxiety   . Arthritis    "neck, back, hands" (09/03/2014)  . Chronic airway obstruction, not elsewhere classified    "I was told I don't have this/tests done @ [facility] 05/2014"  . Chronic back pain   . Colon polyps   . Complication of anesthesia    "I've had recall; I probably stopped breathing at least 2 times" (09/03/2014)  . Dehydration   . Depressive disorder, not elsewhere classified   . Diabetic peripheral neuropathy ([place])   . Dysphagia 2007   historyof dysphagia with severe dysmotility by barium swallow-[name]  . Esophageal reflux   . Extrinsic asthma, unspecified    "I was told I don't have this/tests done @ [facility] 05/2014"  . Family history of adverse reaction to anesthesia    "my brother was told they lost him a couple times during OR"  . Headache    "weekly to monthly" (09/03/2014)    . Heart murmur   . History of hiatal hernia   . Hypertension   . Inflammatory and toxic neuropathy, unspecified   . Migraine    "couple times/month right now" (09/03/2014)  . Obesity, unspecified   . OSA (obstructive sleep apnea)     CPAP  . Spondylosis of unspecified site without mention of myelopathy   . Type II diabetes mellitus ([place])   . Unspecified essential hypertension   . Unspecified menopausal and postmenopausal disorder   . Unspecified vitamin D deficiency     Past Surgical History:  Procedure Laterality Date  . ABDOMINAL HYSTERECTOMY     "partial"  . ANTERIOR CERVICAL DECOMP/DISCECTOMY FUSION     multiple cervical spine levels;Dr. Arnoldo Morale  . APPENDECTOMY    . BACK SURGERY    . CHOLECYSTECTOMY OPEN    . COLONOSCOPY N/A 05/08/2014   Procedure: COLONOSCOPY;  Surgeon: [name], MD;  Location: [facility];  Service: Endoscopy;  Laterality: N/A;  . DILATION AND CURETTAGE OF UTERUS    . ESOPHAGOGASTRODUODENOSCOPY N/A 05/08/2014   Procedure: ESOPHAGOGASTRODUODENOSCOPY (EGD);  Surgeon: [name], MD;  Location: [name];  Service: Endoscopy;  Laterality: N/A;  . EXPLORATORY LAPAROTOMY    . EYE SURGERY    . FOOT SURGERY Right X 2   "took blood out of my arms and put platelets in  my feet"  . INCISION AND DRAINAGE ABSCESS     Chest  . JOINT REPLACEMENT    . REFRACTIVE SURGERY Bilateral   . TONSILLECTOMY    . TOTAL KNEE ARTHROPLASTY Left 10/2003  . TOTAL KNEE ARTHROPLASTY Right 01/2004   [name]    Social History   Social History  . Marital status: Widowed    Spouse name: N/A  . Number of children: 1  . Years of education: N/A   Occupational History  . disabled Disabled   Social History Main Topics  . Smoking status: Former Smoker    Packs/day: 1.00    Years: 20.00    Types: Cigarettes    Quit date: 06/22/1986  . Smokeless tobacco: Never Used  . Alcohol use Yes  . Drug use: No  . Sexual activity: Not on file   Other Topics Concern  . Not  on file   Social History Narrative   ** Merged History Encounter **       Patient does not get regular exercise.      Widowed 2004      Disabled    Current Outpatient Prescriptions on File Prior to Visit  Medication Sig Dispense Refill  . albuterol (PROVENTIL) (2.5 MG/3ML) 0.083% nebulizer solution Take 2.5 mg by nebulization every 6 (six) hours as needed for wheezing or shortness of breath.    . allopurinol (ZYLOPRIM) 300 MG tablet Take 300 mg by mouth daily.    Marland Kitchen aspirin EC 81 MG tablet Take 81 mg by mouth at bedtime.    . chlorpheniramine (CHLORPHEN) 4 MG tablet Take 4 mg by mouth daily.    . cholecalciferol (VITAMIN D) 1000 UNITS tablet Take 2,000 Units by mouth daily.     . famotidine (PEPCID) 20 MG tablet TAKE 1 TABLET BY MOUTH EVERY NIGHT AT BEDTIME 30 tablet 0  . fluticasone (VERAMYST) 27.5 MCG/SPRAY nasal spray Place 2 sprays into the nose daily. 10 g 2  . folic acid (FOLVITE) 1 MG tablet TAKE 4 TABLETS(4 MG) BY MOUTH DAILY 120 tablet 0  . gabapentin (NEURONTIN) 800 MG tablet Take 1 tablet (800 mg total) by mouth 3 (three) times daily. 270 tablet 1  . HYDROcodone-homatropine (HYCODAN) 5-1.5 MG/5ML syrup Take 5 mLs by mouth every evening. 240 mL 0  . Insulin Syringe-Needle U-100 (INSULIN SYRINGE 1CC/31GX5/16") 31G X 5/16" 1 ML MISC USE TO INJECT INSULIN TWICE DAILY 100 each 0  . ipratropium (ATROVENT) 0.03 % nasal spray Place 2 sprays into both nostrils 3 (three) times daily as needed for rhinitis. 30 mL 5  . irbesartan (AVAPRO) 300 MG tablet Take 300 mg by mouth daily.    Marland Kitchen loratadine (CLARITIN) 10 MG tablet Take 10 mg by mouth daily.    . metoprolol (LOPRESSOR) 50 MG tablet Take 75 mg by mouth 2 (two) times daily.    . montelukast (SINGULAIR) 10 MG tablet TAKE 1 TABLET(10 MG) BY MOUTH DAILY 90 tablet 1  . nystatin cream (MYCOSTATIN) Apply 1 application topically 2 (two) times daily as needed for dry skin.    . ONE TOUCH ULTRA TEST test strip TEST BLOOD SUGAR TWICE DAILY 100  each 11  . oxybutynin (DITROPAN-XL) 10 MG 24 hr tablet Take 1 tablet (10 mg total) by mouth daily. 90 tablet 1  . pantoprazole (PROTONIX) 40 MG tablet Take 1 tablet (40 mg total) by mouth 2 (two) times daily. 60 tablet 5  . predniSONE (DELTASONE) 20 MG tablet Take 1 tablet (20 mg total) by  mouth daily with breakfast. 30 tablet 5  . sodium chloride (OCEAN) 0.65 % SOLN nasal spray Place 2 sprays into both nostrils 2 (two) times daily.     No current facility-administered medications on file prior to visit.     Allergies  Allergen Reactions  . Ace Inhibitors Anaphylaxis and Cough  . Penicillins Hives and Other (See Comments)    Has patient had a PCN reaction causing immediate rash, facial/tongue/throat swelling, SOB or lightheadedness with hypotension:  No Has patient had a PCN reaction causing severe rash involving mucus membranes or skin necrosis:  No Has patient had a PCN reaction that required hospitalization No Has patient had a PCN reaction occurring within the last 10 years: No If all of the above answers are "NO", then may proceed with Cephalosporin use.  . Adhesive [Tape] Hives    Family History  Problem Relation Age of Onset  . Esophageal cancer Brother   . Esophageal cancer Sister        ?  . Colon polyps Brother   . Pancreatic cancer Sister        ?  . Diabetes Sister   . Diabetes Unknown        Aunt and Uncle  . Heart disease Maternal Grandfather     BP 126/76 (BP Location: Right Arm, Patient Position: Sitting)   Pulse 86   Wt (!) 316 lb 6.4 oz (143.5 kg)   SpO2 97%   BMI 49.19 kg/m    Review of Systems Denies LOC.      Objective:   Physical Exam VITAL SIGNS:  See vs page GENERAL: no distress Pulses: dorsalis pedis intact bilat.   MSK: no deformity of the feet CV: 1+ bilat leg edema Skin:  no ulcer on the feet, but the skin is dry.  normal color and temp on the feet. Neuro: sensation is intact to touch on the feet   A1c=7.9%    Assessment & Plan:   Insulin-requiring type 2 DM: this is the best control this pt should aim for, given this regimen, which does match insulin to her changing needs throughout the day. Acute bronchitis.  Prednisone is affecting a1c.  Patient Instructions  Please reduce the insulin to 110 units each morning.    On this type of insulin schedule, you should eat meals on a regular schedule.  If a meal is missed or significantly delayed, your blood sugar could go low.   Call us if the prednisone is increased again and blood sugar is high, so we can temporarily increase the insulin.  check your blood sugar twice a day.  vary the time of day when you check, between before the 3 meals, and at bedtime.  also check if you have symptoms of your blood sugar being too high or too low.  please keep a record of the readings and bring it to your next appointment here.  You can write it on any piece of paper.  please call us sooner if your blood sugar goes below 70, or if you have a lot of readings over 200.   Please come back for a follow-up appointment in 4 months.

## 2016-11-06 NOTE — Patient Instructions (Signed)
Please reduce the insulin to 110 units each morning.    On this type of insulin schedule, you should eat meals on a regular schedule.  If a meal is missed or significantly delayed, your blood sugar could go low.   Call us if the prednisone is increased again and blood sugar is high, so we can temporarily increase the insulin.  check your blood sugar twice a day.  vary the time of day when you check, between before the 3 meals, and at bedtime.  also check if you have symptoms of your blood sugar being too high or too low.  please keep a record of the readings and bring it to your next appointment here.  You can write it on any piece of paper.  please call us sooner if your blood sugar goes below 70, or if you have a lot of readings over 200.   Please come back for a follow-up appointment in 4 months.

## 2016-11-09 ENCOUNTER — Other Ambulatory Visit: Payer: Self-pay | Admitting: Emergency Medicine

## 2016-11-09 ENCOUNTER — Other Ambulatory Visit: Payer: Self-pay | Admitting: Internal Medicine

## 2016-11-11 ENCOUNTER — Other Ambulatory Visit: Payer: Self-pay

## 2016-11-11 ENCOUNTER — Encounter: Payer: Self-pay | Admitting: Emergency Medicine

## 2016-11-11 ENCOUNTER — Ambulatory Visit (INDEPENDENT_AMBULATORY_CARE_PROVIDER_SITE_OTHER): Payer: PPO | Admitting: Emergency Medicine

## 2016-11-11 ENCOUNTER — Encounter: Payer: Self-pay | Admitting: *Deleted

## 2016-11-11 DIAGNOSIS — R05 Cough: Secondary | ICD-10-CM | POA: Diagnosis not present

## 2016-11-11 DIAGNOSIS — R058 Other specified cough: Secondary | ICD-10-CM

## 2016-11-11 DIAGNOSIS — G4733 Obstructive sleep apnea (adult) (pediatric): Secondary | ICD-10-CM | POA: Diagnosis not present

## 2016-11-11 MED ORDER — PREDNISONE 10 MG PO TABS: ORAL_TABLET | ORAL | 0 refills | Status: DC

## 2016-11-11 MED ORDER — HYDROCODONE-HOMATROPINE 5-1.5 MG/5ML PO SYRP: 5.0000 mL | ORAL_SOLUTION | Freq: Four times a day (QID) | ORAL | 0 refills | Status: DC | PRN

## 2016-11-11 NOTE — Progress Notes (Signed)
Subjective:    Patient ID: Christy May, female    DOB: [birth year], 66 y.o.   MRN: [id number]  HPI 66 yo woman, former smoker with obesity, OSA, HTN on irbesartan, DM, childhood asthma. Carries a dx of adult asthma although PFT 2015 most consistent w restriction and variable UA irritation on inspiratory loop. She has done speech rx and training for VCD before. Contributing factors are chronic rhinitis, GERD. She has felt that albuterol has helped her in the past.   She is on rx for  GERD and allergic rhinitis. Still has breakthrough GERD sx. She has done speech therapy for VCD and chronic cough before. Has been rx with aggressive allergy regimen, Nexium bid. She has been dealing with a flare of cough and dyspnea for last 7 weeks. Was treated with azithro + Pred + hycodan, then more recently again with dexamethasone, finishing taper now. She has not been able to get nexium bid, was temporarily off flonase due to concern that it was contributing to cough. She feels that when she urinates it sends her signal to cough (she used to have stress / cough incontinence). She states that she is having significant drainage from nose.   Current meds: Nexium 40 qd (increased to 40 bid recently, but could through insurance), singulair, flonase / veramyst, chlorpheniramine, gabapentin, loratadine, zantac, ocean spray. Fish oil.   Acute OV 06/03/16 -- This is an acute office visit for patient well known to me with a history of adult asthma and upper airway irritation syndrome/vocal cord dysfunction. She also has obstructive sleep apnea, hypertension, diabetes. Chronic cough that is being driven by chronic rhinitis and GERD. I last saw her on 05/11/16, I added pantoprazole to Zantac. She continued gabapentin, Singulair, Flonase, chlorpheniramine, nasal saline washes. She was also on dexamethasone and I asked her to continue this. I added Atrovent nasal spray. Since that visit I was notified by the pharmacy that she  had been overprescribed cough syrup with codeine: 800 mL in 6-7 week time period, prescriptions filled from 7 different providers. Based on this I informed her that she would be dismissed from th practice. She sent me a personal letter to explain the situation and informed me that this was a misunderstanding.   She presents today describing new onset nasal congestion, nausea, cough, HA, chest discomfort beginning about 3-4 days ago. Her dexamethasone ended few weeks ago. She may have had sick contacts at church.   ROV 08/19/16 -- this follow-up visit for patient with a history of upper airway irritation and vocal cord dysfunction. She likely also has some superimposed asthma. She has obstructive sleep apnea, hypertension, diabetes, chronic rhinitis, GERD.  She tells me that she has been having problems for the last 2 months - she has been off of her narcotic cough medication for this time, has more cough. In the interim she believes that she caught a URI. She has seen Dr Criss Rosales regarding her sx in early January, she understandably has hesitation prescribing pred or narcotic cough syrup because the pt has misused these. She saw her ortho MD for back pain, was treated with a pred taper - made her better, less cough and wheeze, less nasal congestion. She has been off her CPAP since early January due to eye infection and eye surgery.   ROV 11/11/16 - patient has a history of upper airway irritation vocal cord dysfunction, probably some superimposed asthma, obstructive sleep apnea, hypertension, chronic rhinitis and GERD. She was seen one month ago  for an exacerbation of her cough. She has used Hycodan to suppress the cough; there has been some question about whether she has over filled prescriptions for her chronic cough syrup in the past.. She is on Protonix twice a day, Pepcid, nasal saline spray, Singulair, Claritin, Atrovent nasal spray. She had been doing quite well for the last month, was exposed to URI and  developed cold sx about 2 days ago. She is using hycodan prn. She is wearing her CPAP reliably.     Review of Systems As per HPI     Objective:   Physical Exam Vitals:   11/11/16 1511  BP: 122/88  Pulse: 82  SpO2: 97%  Weight: (!) 316 lb 12.8 oz (143.7 kg)  Height: 5\' 4"  (1.626 m)    GEN: A/Ox3; depressed affect, obese woman in no distress   HEENT:  OP clear, hoarse voice. No postnasal drip evident  NECK:  No stridor  RESP  Clear bilaterally, no wheeze on forced expiration  CARD:  RRR, no m/r/g, trace edema.   Musco: Warm bil, no deformities or joint swelling noted.   Neuro: alert, no focal deficits noted.    Skin: Warm, no lesions or rashes       Assessment & Plan:  Obstructive sleep apnea Please continue to use your CPAP every night. We will re-order through [facility] to insure that you get your supplies.  Follow with Dr Lamonte Sakai in 4 months or sooner if you have any problems.  Upper airway cough syndrome With an acute exacerbation in the setting of an upper respiratory infection.  Please increase your prednisone as directed, tapering back down to 20mg  daily.  Start taking mucinex 600mg  twice a day for the next 2 weeks.  Continue your other medications as you have been taking them   Baltazar Apo, MD, PhD 11/11/2016, 3:29 PM [facility] [phone] or if no answer [phone]

## 2016-11-11 NOTE — Assessment & Plan Note (Signed)
With an acute exacerbation in the setting of an upper respiratory infection.  Please increase your prednisone as directed, tapering back down to 20mg  daily.  Start taking mucinex 600mg  twice a day for the next 2 weeks.  Continue your other medications as you have been taking them

## 2016-11-11 NOTE — Addendum Note (Signed)
Addended by: Jannette Spanner on: 11/11/2016 03:44 PM   Modules accepted: Orders

## 2016-11-11 NOTE — Patient Instructions (Signed)
Please increase your prednisone as directed, tapering back down to 20mg  daily.  Start taking mucinex 600mg  twice a day for the next 2 weeks.  Continue your other medications as you have been taking them  Please continue to use your CPAP every night. We will re-order through [facility] to insure that you get your supplies.  We will refill Hycodan prescription today for you to use as needed for cough.  Follow with Dr Lamonte Sakai in 4 months or sooner if you have any problems.

## 2016-11-11 NOTE — Patient Outreach (Signed)
[name]) Care Management  11/11/2016  Christy May Jan 23, 1951 [id number]   Telephone Screen  Referral Date: 11/10/16 Referral Source: EMMI Prevent Referral Reason: "COPD,DM, HTN,OSA, vocal cord dysfunction,GERD" Insurance: HTA   Outreach attempt # 1 to patient. Spoke with patient and screening completed.   Social: Patient resides in her home alone. She states that she has a dtr that lives out of town. She relies on her friends and church members to help her when needed. Patient voices that it is becoming increasingly difficult for her to perform her ADLS independently. She is requesting assistance with this. She relies on her friends/church members to transport her to appt. She states that she is in the process of reapplying for SCAT services. Patient reports that she has had about 2-3 falls within the past year in the home. Last fall was a few months ago. DME in the home include rollator, powered chair, BSC, cbg meter, cane, oxygen concentrator and CPAP machine. Patient reports she is "having financial trouble everywhere." She states that she tried applying for Medicaid some time ago but did not get approved. She states she is interested in getting MOWs services as she often does not have enough food in the home. She reports she only gets $15/month in food stamps. She voices that sometimes she goes to [employer] to receive food donations. She is interested in other food banks/pantries services available to her as well.   Conditions: Patient has PMH of bilateral knee replacements, COPD, DM(last A1C 7.9-May 2018), Diabetic retinopathy and neuropathy,HTN, Arthritis, anxiety, heart murmur, depression and OSA. Patient states she has been having a lot of hypoglycemic events which normally occur while she is sleeping and upon just waking up. She states she is checking cbgs about 1-2x/day. Patient reports that she does not have BP monitor to check BP in the home. She  reports that she has chronic pain issues related to spinal fusion surgery that resulted in injured muscles.    Medications: She states she is taking "over 20 meds." patient does voice some financial hardship with affording meds. She states she is able to manage her meds independently.   Appointments: She saw PCP(Dr. Criss Rosales) on 11/05/16, DM MD(Dr. [name])-11/06/16. She has appt with lung MD(Dr. Ginnie Smart) today. She also goes to [facility] appt on 11/17/16 and is followed by Ortho MD-[facility] -[facility].   Advance Directives: Patient reports she has designated her dtr-[name] as HCPOA. However, she is considering changing this as she does not feel like dtr is the "best choice" for this role.   Consent: [facility] services reviewed and discussed. Patient gave verbal consent for [facility] services.   Plan: RN CM will notify [facility] administrative assistant of case status. RN CM provided patient with [facility] 24 hr Nurse Line contact info. RN CM will send referral to [facility] RN for further in home eval/assessment of care needs and management of chronic conditions. RN CM will send [facility] referral for possible assistance with community resources related to food, transportation and in home support along with possible advance directive assistance. RN CM will send [place] referral for polypharmacy med review and possible med assistance.   Enzo Montgomery, RN,BSN,CCM [facility] Telephonic Care Management Coordinator Direct Phone: [phone] Toll Free: [phone] Fax: [phone]

## 2016-11-11 NOTE — Assessment & Plan Note (Signed)
Please continue to use your CPAP every night. We will re-order through [facility] to insure that you get your supplies.  Follow with Dr Lamonte Sakai in 4 months or sooner if you have any problems.

## 2016-11-12 ENCOUNTER — Telehealth: Payer: Self-pay | Admitting: Emergency Medicine

## 2016-11-12 ENCOUNTER — Other Ambulatory Visit: Payer: Self-pay | Admitting: *Deleted

## 2016-11-12 NOTE — Patient Outreach (Signed)
[name]) Care Management  11/12/2016  IZZIE May [birth year] [id number]   Referral received from telephonic care manager for assessment of needs/assistance with management of chronic medical conditions.  Per chart, she has history of hypertension, diabetes, and COPD.  Call placed to member, identity verified.  This care manager introduced self and purpose of call.  [facility] care management services explained, member familiar as she has been active with program in the past.  She expresses concern regarding difficulty managing conditions, stating "It's so much, I just need some help."  She denies any urgent needs for today, does agree to home visit/assessment next week.  Will develop individualized care plan at that time.  Contact information for this care manager provided, advised to contact with questions.  [name], [place], MSN [place] [phone]

## 2016-11-12 NOTE — Telephone Encounter (Signed)
Pt returning call.Christy May ° °

## 2016-11-12 NOTE — Telephone Encounter (Signed)
lmtcb x1 for pt. 

## 2016-11-12 NOTE — Telephone Encounter (Signed)
Nothing to add to Triage advice

## 2016-11-12 NOTE — Telephone Encounter (Signed)
Patient was seen yesterday and wants to know if she is contagious.  Advised that I do not see a reason why she would be contagious.  Aware to keep her hands washed and sanitize.  Be mindful of coughing in anyone's general direction.  Let our office know if you start running a fever.  Will send to DOD with any further information or rec's.   Upper airway cough syndrome With an acute exacerbation in the setting of an upper respiratory infection.  Please increase your prednisone as directed, tapering back down to 20mg  daily.  Start taking mucinex 600mg  twice a day for the next 2 weeks.  Continue your other medications as you have been taking them

## 2016-11-12 NOTE — Telephone Encounter (Signed)
Noted. Nothing further needed. 

## 2016-11-17 ENCOUNTER — Ambulatory Visit (INDEPENDENT_AMBULATORY_CARE_PROVIDER_SITE_OTHER): Payer: PPO | Admitting: Podiatry

## 2016-11-17 ENCOUNTER — Encounter: Payer: Self-pay | Admitting: *Deleted

## 2016-11-17 ENCOUNTER — Other Ambulatory Visit: Payer: Self-pay | Admitting: *Deleted

## 2016-11-17 ENCOUNTER — Encounter: Payer: Self-pay | Admitting: Podiatry

## 2016-11-17 DIAGNOSIS — M79676 Pain in unspecified toe(s): Secondary | ICD-10-CM | POA: Diagnosis not present

## 2016-11-17 DIAGNOSIS — E1342 Other specified diabetes mellitus with diabetic polyneuropathy: Secondary | ICD-10-CM

## 2016-11-17 DIAGNOSIS — M2042 Other hammer toe(s) (acquired), left foot: Secondary | ICD-10-CM | POA: Diagnosis not present

## 2016-11-17 DIAGNOSIS — M2041 Other hammer toe(s) (acquired), right foot: Secondary | ICD-10-CM | POA: Diagnosis not present

## 2016-11-17 DIAGNOSIS — B351 Tinea unguium: Secondary | ICD-10-CM | POA: Diagnosis not present

## 2016-11-17 NOTE — Patient Outreach (Signed)
[facility]) Care Management   11/17/2016  Christy May 07/13/1950 [id number]  Christy May is an 66 y.o. female  Subjective:   Member alert & oriented x3, rates pain at 8/10, related to fibromyalgia.  She report compliance with medications & daily blood sugar checks.  Does not check blood pressure daily, does have monitor.  Educates on correct use of machine, return demonstration noted.  Objective:   Review of Systems  Constitutional: Negative.   HENT: Negative.   Eyes: Negative.   Respiratory: Positive for cough.   Cardiovascular: Negative.   Gastrointestinal: Negative.   Genitourinary: Negative.   Musculoskeletal: Positive for joint pain and myalgias.  Skin: Negative.   Neurological: Negative.   Endo/Heme/Allergies: Negative.   Psychiatric/Behavioral: Negative.     Physical Exam  Constitutional: She is oriented to person, place, and time. She appears well-developed and well-nourished.  Neck: Normal range of motion.  Cardiovascular: Normal rate, regular rhythm and normal heart sounds.   Respiratory: Effort normal and breath sounds normal.  GI: Soft. Bowel sounds are normal.  Musculoskeletal: Normal range of motion.  Neurological: She is alert and oriented to person, place, and time.  Skin: Skin is warm and dry.   BP 118/62 (BP Location: Right Arm, Patient Position: Sitting, Cuff Size: Normal)   Pulse 77   Resp 20   Ht 1.626 m ('5\' 4"' )   Wt (!) 316 lb (143.3 kg)   SpO2 97%   BMI 54.24 kg/m   Encounter Medications:   Outpatient Encounter Prescriptions as of 11/17/2016  Medication Sig  . allopurinol (ZYLOPRIM) 300 MG tablet Take 300 mg by mouth daily.  Marland Kitchen aspirin EC 81 MG tablet Take 81 mg by mouth at bedtime.  . chlorpheniramine (CHLORPHEN) 4 MG tablet Take 4 mg by mouth daily.  . cholecalciferol (VITAMIN D) 1000 UNITS tablet Take 2,000 Units by mouth daily.   . Cinnamon 500 MG capsule Take 1,000 mg by mouth daily.  . Cyanocobalamin  (VITAMIN B 12 PO) Take 500 mcg by mouth daily.  . fluticasone (VERAMYST) 27.5 MCG/SPRAY nasal spray Place 2 sprays into the nose daily.  . folic acid (FOLVITE) 1 MG tablet TAKE 4 TABLETS(4 MG) BY MOUTH DAILY  . gabapentin (NEURONTIN) 800 MG tablet Take 1 tablet (800 mg total) by mouth 3 (three) times daily.  Marland Kitchen HYDROcodone-homatropine (HYCODAN) 5-1.5 MG/5ML syrup Take 5 mLs by mouth every evening.  Marland Kitchen HYDROcodone-homatropine (HYCODAN) 5-1.5 MG/5ML syrup Take 5 mLs by mouth every 6 (six) hours as needed for cough.  . insulin NPH Human (NOVOLIN N RELION) 100 UNIT/ML injection Inject 1.1 mLs (110 Units total) into the skin every morning.  . Insulin Syringe-Needle U-100 (INSULIN SYRINGE 1CC/31GX5/16") 31G X 5/16" 1 ML MISC USE TO INJECT INSULIN TWICE DAILY  . ipratropium (ATROVENT) 0.03 % nasal spray Place 2 sprays into both nostrils 3 (three) times daily as needed for rhinitis.  Marland Kitchen irbesartan (AVAPRO) 300 MG tablet Take 300 mg by mouth daily.  . metoprolol (LOPRESSOR) 50 MG tablet Take 75 mg by mouth 2 (two) times daily.  . montelukast (SINGULAIR) 10 MG tablet TAKE 1 TABLET(10 MG) BY MOUTH DAILY  . nystatin cream (MYCOSTATIN) Apply 1 application topically 2 (two) times daily as needed for dry skin.  . ONE TOUCH ULTRA TEST test strip TEST BLOOD SUGAR TWICE DAILY  . oxybutynin (DITROPAN-XL) 10 MG 24 hr tablet Take 1 tablet (10 mg total) by mouth daily.  . pantoprazole (PROTONIX) 40 MG tablet TAKE 1 TABLET(40  MG) BY MOUTH TWICE DAILY  . predniSONE (DELTASONE) 10 MG tablet Take 4 tabs by mouth for 3 days, then 3 for 3 days, 2 for 3 days, 1 for 3 days and stop  . predniSONE (DELTASONE) 20 MG tablet Take 1 tablet (20 mg total) by mouth daily with breakfast.  . ranitidine (ZANTAC) 300 MG tablet Take 300 mg by mouth at bedtime.  . sodium chloride (OCEAN) 0.65 % SOLN nasal spray Place 2 sprays into both nostrils 2 (two) times daily.  . vitamin C (ASCORBIC ACID) 500 MG tablet Take 500 mg by mouth daily.  .  vitamin E 400 UNIT capsule Take 400 Units by mouth daily.  Marland Kitchen albuterol (PROVENTIL) (2.5 MG/3ML) 0.083% nebulizer solution Take 2.5 mg by nebulization every 6 (six) hours as needed for wheezing or shortness of breath.  . famotidine (PEPCID) 20 MG tablet TAKE 1 TABLET BY MOUTH EVERY NIGHT AT BEDTIME (Patient not taking: Reported on 11/17/2016)  . loratadine (CLARITIN) 10 MG tablet Take 10 mg by mouth daily.   No facility-administered encounter medications on file as of 11/17/2016.     Functional Status:   In your present state of health, do you have any difficulty performing the following activities: 11/17/2016  Hearing? N  Vision? N  Difficulty concentrating or making decisions? N  Walking or climbing stairs? Y  Dressing or bathing? Y  Doing errands, shopping? N  Preparing Food and eating ? N  Using the Toilet? Y  In the past six months, have you accidently leaked urine? Y  Do you have problems with loss of bowel control? Y  Managing your Medications? N  Managing your Finances? N  Housekeeping or managing your Housekeeping? Y  Some recent data might be hidden    Fall/Depression Screening:    Fall Risk  11/11/2016 04/19/2014  Falls in the past year? Yes Yes  Number falls in past yr: 2 or more 2 or more  Injury with Fall? No -  Risk Factor Category  High Fall Risk (No Data)  Risk for fall due to : History of fall(s);Impaired balance/gait;Impaired mobility;Medication side effect;Impaired vision Impaired mobility  Follow up Falls evaluation completed;Education provided -   PHQ 2/9 Scores 11/11/2016 01/25/2015 04/19/2014 04/19/2014  PHQ - 2 Score 0 0 0 1    Assessment:    Met with member at scheduled time.  [facility] care management services re-explained as member was involved in the past.  Consent signed.  She express concerns regarding the need for in home aide to assist with ADLs and food assistance.  She is aware that social worker referral has been placed, contact will be made within the  next week.  She also expresses concern regarding medications.  Member taking several medications, multiple over the counter vitamin supplements.  She is concerned with duplications.  Medications in the home reviewed against list in chart, updated.  She report taking all as prescribed.  She is also made aware that referral to pharmacy has been placed for review.    This care manager inquired about the [facility] medical/nursing needs.  She report that she has been working with Dr. Loanne Drilling on diabetes management, report a decrease in A1C from 12 when she first started working with him down to 7.9 this month.  She state that she is now down from 190 units of insulin to 110, continue to struggle with diet at times but report overall good management.  She report she would like to lose weight, advised that continuing  proper management of diabetes (diet and exercise in particular) will help.  She voices concern with intermittent bronchitis, most recently seen by pulmonary on 5/23, given prednisone. Aware that this has caused a slight increase in her blood sugars.  She has PRN nebulizer and inhalers as well as oxygen and CPAP, report understanding of correct use and compliance.  Report cough/congestion much better.    She report history of hypertension, recently well controlled.  She is compliant with antihypertensives, has blood pressure monitor at home, educated on correct use, advised to monitor daily and record readings.  Provided with [facility] calendar tool book with logs.  She does not report any other nursing concerns, expresses again concern for medication review and in home assistance.  Contact information provided, advised to contact with concerns/questions while waiting to be contacted by LCSW and pharmacist.  Plan:   Will follow up with member in 2 weeks to confirm contact with pharmacist and social worker.  Will reassess nursing needs at that time.  [name], [place], MSN [place] [phone]

## 2016-11-17 NOTE — Progress Notes (Signed)
Patient ID: Christy May, female   DOB: 11-01-1950, 66 y.o.   MRN: [id number] Complaint:  Visit Type: Patient returns to my office for continued preventative foot care services. Complaint: Patient states" my nails have grown long and thick and become painful to walk and wear shoes" Patient has been diagnosed with DM with hammer toes and capsulitis right foot.. The patient presents for preventative foot care services. No changes to ROS.  She also has painful callus under the ball of her left foot.    Podiatric Exam: Vascular: dorsalis pedis and posterior tibial pulses are palpable right foot and diminished left foot.. Capillary return is immediate. Temperature gradient is WNL. Skin turgor WNL  Sensorium: Normal Semmes Weinstein monofilament test. Normal tactile sensation bilaterally. Nail Exam: Pt has thick disfigured discolored nails with subungual debris noted bilateral entire nail hallux through fifth toenails Ulcer Exam: There is no evidence of ulcer or pre-ulcerative changes or infection. Orthopedic Exam: Muscle tone and strength are WNL. No limitations in general ROM. No crepitus or effusions noted. Foot type and digits show no abnormalities. Hallux varus left foot.  Hammer toes 3,4,5 right foot. Skin:  Porokeratosis sub 2 left.. No infection or ulcers  Diagnosis:  Onychomycosis, , Pain in right toe, pain in left toes,  Diabetic neuropathy  Hammer toes 3,4,5 right foot.  Hallux varus left foot.  Treatment & Plan Procedures and Treatment: Consent by patient was obtained for treatment procedures. The patient understood the discussion of treatment and procedures well. All questions were answered thoroughly reviewed. Debridement of mycotic and hypertrophic toenails, 1 through 5 bilateral and clearing of subungual debris. No ulceration, no infection noted. .  Hammer toes 3,4,5 right foot.  Hallux varus 1st MPJ left foot. Dispense diabetic shoes.  Patient presents today and was dispensed 0ne pair (  two units) of medically necessary extra depth shoes with three pair( six units) of custom molded multiple density inserts. The shoes and the inserts are fitted to the patients ' feet and are noted to fit well and are free of defect.  Length and width of the shoes are also acceptable.  Patient was given written and verbal  instructions for wearing.  If any concerns arrive with the shoes or inserts, the patient is to call the office.Patient is to follow up with doctor in six weeks. Return Visit-Office Procedure: Patient instructed to return to the office for a follow up visit 3 months for continued evaluation and treatment.    Gardiner Barefoot DPM

## 2016-11-18 ENCOUNTER — Telehealth: Payer: Self-pay | Admitting: Emergency Medicine

## 2016-11-18 ENCOUNTER — Telehealth: Payer: Self-pay | Admitting: Pharmacist

## 2016-11-18 NOTE — Patient Outreach (Signed)
[facility]) Care Management  11/18/2016  KAYDYN CHISM 09-Jul-1950 [id number]   Called patient regarding medication management and adherence per referral from Malcom Florance due to patient being on >20 medications.  HIPAA identifiers were obtained.  Patient is a 66 year old female with multiple medical problems including but not limited to: hypertension, OSA, GERD, obesity, type 2 diabetes, polyneuropathy,vitamin d deficiency, and DJD of the cervical spine.  Patient reported managing her medications on her own and getting confused often due to the shear number of medications she takes. She does not know all the names of her medications. She also reported using a pill box but often forgetting to put certain pills in the box.    Due to the patient's confusion and the number of medications in he regimen, a home visit was scheduled for 11/19/2016 at 11:00am.  Elayne Guerin, PharmD, [place] Clinical Pharmacist [phone]

## 2016-11-18 NOTE — Telephone Encounter (Signed)
Spoke with Ecru from [facility]. He is aware that RB is not in clinic. He needs order signed about pt's cpap. Will route to RB

## 2016-11-18 NOTE — Telephone Encounter (Signed)
-----   Message from Gaynelle Adu, [facility] sent at 11/11/2016  5:26 PM EDT ----- Regarding: FW: Referral: Order for [name]   ----- Message ----- From: Jiles Harold Sent: 11/11/2016   1:20 PM To: Gaynelle Adu, Hughes, # Subject: Referral: Order for Nicholes Mango  Referral from Enzo Montgomery, RN  "Please see below request"  Forde Radon ----- Message ----- From: Hayden Pedro, RN Sent: 11/11/2016  12:41 PM To: Thn Cm Communication Orders Subject: Order for BUNA, CUPPETT                    Patient Name: ALDONA, BRYNER P([id number]) Sex: Female DOB: March 17, 1951    PCP: BLAND, [place]   Types of orders made on 11/11/2016: Nursing  Order Date:11/11/2016 Ordering User:FLORANCE, Shelda Jakes [[id number]] Encounter Provider:Florance, Tomasa Blase, RN [[id number]] Authorizing Provider: Lucianne Lei, MD [[id number]] Department:[facility][[id number]]  Order Specific Information Order: Comm to Pharmacy [Custom: [id number]]  Order #: [id number] Qty: 1   Priority: Routine  Class: Clinic Performed   Comment:[facility] Pharmacy Referral            Referral from: [facility] Prevent            Polypharmacy med review-patient taking greater than 20 meds            Medication assistance. Patient unable to afford meds                        Thanks,            Hetty Blend            [place] Management            Telephonic Care Management Coordinator            Direct Phone: [phone]            Toll Free: [phone]            Fax: [phone]   Disposition: Return :RN CM to call patient.   Priority: Routine  Class: Clinic Performed   Comment:[facility] Pharmacy Referral            Referral from: [facility] Prevent            Polypharmacy med review-patient taking greater than 20 meds            Medication assistance. Patient unable to afford meds                        Thanks,            Enzo Montgomery,  RN,BSN,CCM            [place] Management            Telephonic Care Management Coordinator            Direct Phone: [phone]            Toll Free: [phone]            Fax: [phone]

## 2016-11-19 ENCOUNTER — Other Ambulatory Visit: Payer: Self-pay | Admitting: Pharmacist

## 2016-11-19 NOTE — Telephone Encounter (Signed)
done

## 2016-11-20 ENCOUNTER — Other Ambulatory Visit: Payer: Self-pay | Admitting: *Deleted

## 2016-11-20 NOTE — Patient Outreach (Signed)
[facility]) Care Management  [place]   11/19/2016  Christy May Jun 26, 1950 [id number]  Subjective: Home visit completed at patient's home.  HIPAA identifiers were obtained.  Patient is a 66 year old female with multiple medical conditions including but not limited to:  Hypertension, OSA, constipation, GERD, diabetes, DJD, depression, morbid obesity and type 2 diabetes.  Patient reported managing her own medications and using a pill box.  She admitted she gets confused often and feels overwhelmed by her medications.      Objective:  Self-Monitoring blood sugars for 11/19/2016 AM-282 5:30am, 127 10:30am, 117 11:45am   Encounter Medications: Outpatient Encounter Prescriptions as of 11/19/2016  Medication Sig Note  . albuterol (PROVENTIL) (2.5 MG/3ML) 0.083% nebulizer solution Take 2.5 mg by nebulization every 6 (six) hours as needed for wheezing or shortness of breath.   . allopurinol (ZYLOPRIM) 300 MG tablet Take 300 mg by mouth daily.   Marland Kitchen aspirin EC 81 MG tablet Take 81 mg by mouth at bedtime.   . chlorpheniramine (CHLORPHEN) 4 MG tablet Take 4 mg by mouth daily.   . cholecalciferol (VITAMIN D) 1000 UNITS tablet Take 2,000 Units by mouth daily.    . Cinnamon 500 MG capsule Take 1,000 mg by mouth daily.   . ciprofloxacin (CILOXAN) 0.3 % ophthalmic solution Place 1 drop into both eyes daily.   . Cyanocobalamin (VITAMIN B 12 PO) Take 500 mcg by mouth daily.   Marland Kitchen dextromethorphan (DELSYM) 30 MG/5ML liquid Take 15 mg by mouth 2 (two) times daily.   . fluticasone (VERAMYST) 27.5 MCG/SPRAY nasal spray Place 2 sprays into the nose daily.   . folic acid (FOLVITE) 1 MG tablet TAKE 4 TABLETS(4 MG) BY MOUTH DAILY   . gabapentin (NEURONTIN) 800 MG tablet Take 1 tablet (800 mg total) by mouth 3 (three) times daily.   Marland Kitchen HYDROcodone-acetaminophen (NORCO/VICODIN) 5-325 MG tablet Take 1 tablet every 8-12 hours as needed for pain 11/19/2016: PRN extreme pain only  .  HYDROcodone-homatropine (HYCODAN) 5-1.5 MG/5ML syrup Take 5 mLs by mouth every 6 (six) hours as needed for cough.   . ILEVRO 0.3 % ophthalmic suspension Place 1 drop into the right eye daily.   . insulin NPH Human (NOVOLIN N RELION) 100 UNIT/ML injection Inject 1.1 mLs (110 Units total) into the skin every morning.   . Insulin Syringe-Needle U-100 (INSULIN SYRINGE 1CC/31GX5/16") 31G X 5/16" 1 ML MISC USE TO INJECT INSULIN TWICE DAILY   . ipratropium (ATROVENT) 0.03 % nasal spray Place 2 sprays into both nostrils 3 (three) times daily as needed for rhinitis.   Marland Kitchen irbesartan (AVAPRO) 300 MG tablet Take 300 mg by mouth daily.   Marland Kitchen loratadine (CLARITIN) 10 MG tablet Take 10 mg by mouth daily.   . metoprolol (LOPRESSOR) 50 MG tablet Take 75 mg by mouth 2 (two) times daily.   . montelukast (SINGULAIR) 10 MG tablet TAKE 1 TABLET(10 MG) BY MOUTH DAILY   . nystatin cream (MYCOSTATIN) Apply 1 application topically 2 (two) times daily as needed for dry skin.   . ONE TOUCH ULTRA TEST test strip TEST BLOOD SUGAR TWICE DAILY   . oxybutynin (DITROPAN-XL) 10 MG 24 hr tablet Take 1 tablet (10 mg total) by mouth daily.   . pantoprazole (PROTONIX) 40 MG tablet TAKE 1 TABLET(40 MG) BY MOUTH TWICE DAILY   . prednisoLONE acetate (PRED FORTE) 1 % ophthalmic suspension Place 1 drop into both eyes 4 (four) times daily.   . predniSONE (DELTASONE) 20 MG  tablet Take 1 tablet (20 mg total) by mouth daily with breakfast.   . ranitidine (ZANTAC) 300 MG tablet Take 300 mg by mouth at bedtime.   . sodium chloride (OCEAN) 0.65 % SOLN nasal spray Place 2 sprays into both nostrils 2 (two) times daily.   . traMADol (ULTRAM) 50 MG tablet TAKE 1-2 TABLETS EVERY 4 HOURS AS NEED FOR COUGH OR PAIN   . Turmeric Curcumin 500 MG CAPS Take 1 capsule by mouth daily.   . vitamin C (ASCORBIC ACID) 500 MG tablet Take 500 mg by mouth daily.   . vitamin E 400 UNIT capsule Take 400 Units by mouth daily.   . [DISCONTINUED] HYDROcodone-homatropine  (HYCODAN) 5-1.5 MG/5ML syrup Take 5 mLs by mouth every evening.   . [DISCONTINUED] famotidine (PEPCID) 20 MG tablet TAKE 1 TABLET BY MOUTH EVERY NIGHT AT BEDTIME (Patient not taking: Reported on 11/19/2016)   . [DISCONTINUED] predniSONE (DELTASONE) 10 MG tablet Take 4 tabs by mouth for 3 days, then 3 for 3 days, 2 for 3 days, 1 for 3 days and stop (Patient not taking: Reported on 11/19/2016)    No facility-administered encounter medications on file as of 11/19/2016.     Functional Status: In your present state of health, do you have any difficulty performing the following activities: 11/17/2016  Hearing? N  Vision? N  Difficulty concentrating or making decisions? N  Walking or climbing stairs? Y  Dressing or bathing? Y  Doing errands, shopping? N  Preparing Food and eating ? N  Using the Toilet? Y  In the past six months, have you accidently leaked urine? Y  Do you have problems with loss of bowel control? Y  Managing your Medications? N  Managing your Finances? N  Housekeeping or managing your Housekeeping? Y  Some recent data might be hidden    Fall/Depression Screening: Fall Risk  11/11/2016 04/19/2014  Falls in the past year? Yes Yes  Number falls in past yr: 2 or more 2 or more  Injury with Fall? No -  Risk Factor Category  High Fall Risk (No Data)  Risk for fall due to : History of fall(s);Impaired balance/gait;Impaired mobility;Medication side effect;Impaired vision Impaired mobility  Follow up Falls evaluation completed;Education provided -   PHQ 2/9 Scores 11/11/2016 01/25/2015 04/19/2014 04/19/2014  PHQ - 2 Score 0 0 0 1      Assessment:  Patient's medications were reconciled in the home with her medication bottles.   Drugs sorted by system:  Neurologic/Psychologic: Gabapentin  Cardiovascular: Aspirin Irbesartan Metoprolol tartrate  Pulmonary/Allergy: Albuterol nebulizer solution Chlorpheniramine Fluticasone Nasal Montelukast Hydrocodone/Homatropine  Syrup Dextromethorphan Polistirex (Delsym) Ipratropium Nasal Spray Loratadine Saline Solution (nasal) Prednisone taper (completing) Prednisone 20mg  (daily/maintenance)  Gastrointestinal: Pantoprazole Ranitidine  Endocrine: Humulin N  Renal: Allopurinol  Topical: Nystatin cream  Pain: Hydrocodone/APAP Tramadol Acetaminophen  Vitamins/Minerals: Ascorbic Acid Cinnamon Cyanocobalamin Vitamin E Cholecalciferol (vitamin D) Folic Acid Tumeric  Eye Ilevro eye drops Prednisolone eye drops Ciprofloxacin eye drops  Urinary Oxybutynin XL   Medication Review Findings:   Diabetes therapy: Patient is only using one injection per day of Relion Novolin N.  Her HgA1c was most recently 7.9%.  If deemed therapeutically appropriate, patient's therapy could be optimized with basal insulin (Lantus/Toujeo/Tresiba).    Omitted medications:  (medications the patient had in her possession and reported taking but were not on the medication list in her chart...added to the active medication list)  1.  Hydrocodone/APAP 5/325-prescribed by Dr. Berle Mull  2.  Tramadol 50mg -prescribed by  Dr. Christinia Gully  3.  Dextromethorphan Polistirex- OTC  4.  Tumeric- OTC  5.  Eye drops- Ilevro, Prednisolone, Ciprofloxacin  Therapeutic Duplications:  Tramadol/Hydrocodone/APAP-both medications were omitted from the medication list. Patient had both in her possession and said she does not take both at the same time. Hydrocodone/APAP was last filled 07/27/16 for #30, Tramadol 50mg  was last filled 10/13/16 from Dr. Christinia Gully #40  Pantoprazole/Ranitidine- if deemed therapeutically appropriate investigate the need for both high dose pantoprazole and high dose ranitidine.  Drug Interactions Tramadol/hydrocodone:APAP/hydrocodone:homatropine/Gabapentin- increased risk of CNS depression  Deprescribing Opportunities Vitamins: If deemed therapeutically appropriate some of the vitamins the patient is  taking could be discontinued:  1. Tumeric  2. Vitamin C  3.  Vitamin E  4.  Cinnamon   Medication Assistance Findings  Patient has [facility] and is already receiving extra help from [employer]. This means she will not have a "donut hole" and the most she should pay for a medication is $8.35.   Plan:  1. Route note to PCP  2. Look into getting patient set up on pill packing (Perhaps [facility] packing since she is on so many medications).

## 2016-11-20 NOTE — Patient Outreach (Signed)
[place] [facility]) Care Management  11/20/2016  LYNE KHURANA 1951/03/26 [id number]   CSW made an initial attempt to try and contact patient today to perform phone assessment, as well as assess and assist with social needs and services, without success.  A HIPAA compliant message was left for patient on voicemail.  CSW is currently awaiting a return call. CSW will make a second outreach attempt within the next week, if CSW does not receive a return call from patient in the meantime. Nat Christen, BSW, MSW, LCSW  Licensed Education officer, [facility]  [address] [place] [address], [place], [place] [address] Physical Address-[address], [place], [place] [address] Toll Free Main # [phone] Fax # [phone] Cell # [phone]  Office # [phone] [email or web address]

## 2016-11-24 ENCOUNTER — Other Ambulatory Visit: Payer: Self-pay | Admitting: *Deleted

## 2016-11-24 ENCOUNTER — Encounter: Payer: Self-pay | Admitting: *Deleted

## 2016-11-24 NOTE — Patient Outreach (Signed)
[facility]) Care Management  11/24/2016  Christy May 18-Feb-1951 [id number]   Return call received from patient, in response to HIPAA compliant message left by CSW when patient was unavailable.  CSW was able to make initial contact with patient today to perform phone assessment, as well as assess and assist with social work needs and services.  CSW introduced self, explained role and types of services provided through [place] [facility] ([place] [facility]).  CSW further explained to patient that CSW works with patient's RNCM, also with [place] [facility], [name]. CSW then explained the reason for the call, indicating that Mrs. Christy May thought that patient would benefit from social work services and resources to assist with referrals to various community agencies and resources.  CSW obtained two HIPAA compliant identifiers from patient, which included patient's name and date of birth. Patient admits that she is interested in learning about free meals and meal programs in [place].  CSW first spoke with patient about [employer] through [employer] of [place]; however, patient did not appear to be interested.  CSW then spoke with patient about applying for Food Stamps through the [place], but patient admitted to receiving in the past, no longer interested, due to low amount received.  Last, CSW spoke with patient about various food pantries, soup kitchens and food banks in [place].  Patient expressed an interest so CSW explained the process and services provided, agreeing to mail patient [facility] in [place], as well as [facility] in [place]. Patient then reported that she needs assistance with obtaining transportation to and from physician appointments.  Patient is agreeable to having CSW complete a [employer] Research scientist (medical)) application on her behalf, and submit to [employer]) for processing. CSW explained to patient she should be receiving a call from a representative with [employer] within the next two weeks to perform a phone interview.  Patient voiced understanding, taking down the number for Christy May, Representative with [employer], to periodically check the status of her application. Last, patient is interested in completing her Advanced Directives ([place] documents). CSW will make arrangements to mail patient an Advanced Directives packet, agreeing to schedule a home visit to assist with completion, if necessary. Patient denied wanting home care services at present, realizing that aide services would be an out-of-pocket expense for her.  Patient is not currently an Adult Medicaid recipient, unable to apply for [employer] Youth worker) through [employer] or CAPS Medical illustrator) through [employer] of [place]. CSW is unable to assist patient with completion of a [employer] application, as [employer] is phasing out their program and services being provided in the community. CSW agreed to follow-up with patient in one week, to ensure that patient has received the packet of resource information mailed to her home, as well as to schedule an initial home visit, if needed. Patient was agreeable to this plan. Christy May, BSW, MSW, LCSW  Licensed Education officer, environmental Health System  Mailing [place] [address], [place], [place] [address] Physical Address-[address], [place], [place] [address] Toll Free Main # [phone] Fax # [phone] Cell # [phone]  Office # [phone] [email or web address]

## 2016-11-24 NOTE — Patient Outreach (Signed)
[facility]) Care Management  11/24/2016  Christy May March 29, 1951 [id number]   Request received from [place].  To mail patient personal care resources.  Information mailed today.

## 2016-11-25 ENCOUNTER — Telehealth: Payer: Self-pay | Admitting: Emergency Medicine

## 2016-11-25 MED ORDER — AZITHROMYCIN 250 MG PO TABS: ORAL_TABLET | ORAL | 0 refills | Status: DC

## 2016-11-25 NOTE — Telephone Encounter (Signed)
Patient is aware of antibiotic. Still refuses OV. Medication has been called/

## 2016-11-25 NOTE — Telephone Encounter (Signed)
Spoke with the pt  She is c/o "burning in my chest" and prod cough with pink colored sputum for the past few days  She states that she also has HA and feels achy but no fever  She requests abx  I advised needs ov for eval and she refused b/c she is waiting for the phone company to come to her house  RB- please advise, thanks!

## 2016-11-25 NOTE — Telephone Encounter (Signed)
I agree that she needs an OV.  In meantime she can take azithro - z pack

## 2016-11-26 ENCOUNTER — Other Ambulatory Visit: Payer: Self-pay | Admitting: Pharmacist

## 2016-11-26 NOTE — Patient Outreach (Signed)
[facility]) Care Management  11/26/2016  Christy May Oct 21, 1950 [id number]   Called patient to follow up with her.  HIPAA identifiers were obtained.  Patient reported being stopped up and having a hard time breathing. She was prescribed azithromycin by Dr. Agustina Caroli office for a 7 day supply.  Patient was encouraged to schedule an appointment with Dr. Lamonte Sakai. She already has one for 03/19/17.  Patient said since starting the antibiotic yesterday, she feels better.  She said she would call if she did not continue to feel better.    The pharmacy home visit note from 11/19/16 was routed to the patient's PCP today.  Christy May was called to inquire about pill packing.  They do NOT provide Christy May packing. Their pharmacy provides weekly pill packs (Qube Weekly).  It is unclear if all of the patient's pills will fit in a "one week" card.   After hearing back from the patient's PCP, a determination will be made about Christy May vs Pill pack (patient's choice).  Plan:  I will follow up with the patient in 1 week.  Elayne Guerin, PharmD, [place] Clinical Pharmacist [phone]

## 2016-11-30 ENCOUNTER — Other Ambulatory Visit: Payer: Self-pay | Admitting: *Deleted

## 2016-11-30 NOTE — Patient Outreach (Signed)
[facility]) Care Management  11/30/2016  Christy May 10/03/1950 [id number]   Call placed to member to follow up on contact with social worker and pharmacist.  She report that she has been in contact with both, pharmacist has made a home visit and is in the process of working on medication management.  She state that she received a package in the mail from the social worker, but will need help reviewing due to feeling "overwhelmed" with the amount of information provided.  She is advised to contact LCSW and request review of documents as this care manager is unsure what is enclosed in the package.  This care manager inquires about other nursing needs, member is unable to pinpoint anything in particular, just state she need help managing care.  Her concerns continue to be related to medication management and in home assistance with ADLs and home care.  Her blood pressure and diabetes are both controlled. She does agree to home visit within the next 2 weeks to re-evaluate for nursing needs.  If no nursing needs at that time, will close case.  Christy May, [place], MSN [place] [phone]

## 2016-11-30 NOTE — Patient Outreach (Signed)
[facility]) Care Management  11/30/2016  Christy May 1950-09-30 [id number]   CSW made an attempt to try and contact patient today to follow-up regarding social work services and resources, as well as to confirm that patient received the packet of resource information mailed to her home.  However, patient was unavailable.  A HIPAA compliant message was left for patient on voicemail and CSW is currently awaiting a return call.  CSW will make a second outreach attempt in one week, if CSW does not receive a return call from patient in the meantime. Nat Christen, BSW, MSW, LCSW  Licensed Education officer, environmental Health System  Mailing [place] [address], [place], [place] [address] Physical Address-[address], [place], [place] [address] Toll Free Main # [phone] Fax # [phone] Cell # [phone]  Office # [phone] [email or web address]

## 2016-12-02 ENCOUNTER — Other Ambulatory Visit: Payer: Self-pay | Admitting: Pharmacist

## 2016-12-02 NOTE — Patient Outreach (Signed)
[facility]) Care Management  12/02/2016  DELPHA PERKO [birth year] [id number]  Patient was called to follow up on pill packing. No answer. HIPAA compliant message left on patient's voicemail.  Dr. Fransico Setters office was called to follow up on the note faxed to their office last week.  A message was left for the Nurse, Malachy Mood,  Plan:  Follow up with the patient in 3-5 business days.  Await a call back from the PCP.   Follow up with the PCP in 3-5 business days as well if I do not hear back from them.  Elayne Guerin, PharmD, [place] Clinical Pharmacist [phone]

## 2016-12-03 ENCOUNTER — Ambulatory Visit: Payer: Self-pay | Admitting: *Deleted

## 2016-12-07 ENCOUNTER — Ambulatory Visit: Payer: Self-pay | Admitting: Pharmacist

## 2016-12-08 ENCOUNTER — Other Ambulatory Visit: Payer: Self-pay | Admitting: *Deleted

## 2016-12-08 ENCOUNTER — Other Ambulatory Visit: Payer: Self-pay | Admitting: Emergency Medicine

## 2016-12-08 NOTE — Patient Outreach (Signed)
[facility]) Care Management  12/08/2016  Christy May 25-Nov-1950 [id number]   CSW was able to make contact with patient today to follow-up regarding social work services and resources, as well as to assess need for continued social work involvement.  Patient admitted to receiving the packet of resource information mailed to her home, already utilizing some of the services. CSW inquired as to whether or not patient needs assistance with completion of applications or if patient would like for CSW to make certain referrals on her behalf.  Patient denied, but admitted to "getting ready to walk out the door to attend [employer]".  CSW encouraged patient to contact CSW directly if additional social work needs arise in the near future.  Patient voiced understanding and was agreeable to this plan. CSW will perform a case closure on patient, as all goals of treatment have been met from social work standpoint and no additional social work needs have been identified at this time.  CSW will notify patient's RNCM with [facility] [facility], [name] of CSW's plans to close patient's case.  CSW will fax an update to patient's Primary Care Physician, Dr. Lucianne Lei to ensure that they are aware of CSW's involvement with patient's plan of care.  CSW will submit a case closure request to Verlon Setting, Care Management Assistant with [facility], in the form of an [employer].  CSW will ensure that [name] is aware of [name], RNCM with [place] [facility], continued involvement with patient's care. Nat Christen, BSW, MSW, LCSW  Licensed Education officer, environmental Health System  Mailing [place] [address] [place], [place] [address] Physical Address-[address], [place], [place] [address] Toll Free Main # [phone] Fax # [phone] Cell #  [phone]  Office # [phone] [email or web address]

## 2016-12-08 NOTE — Patient Outreach (Signed)
Request received from [facility], LCSW, to mail patient personal care resources.  Information mailed today 12/08/16.

## 2016-12-10 ENCOUNTER — Other Ambulatory Visit: Payer: Self-pay | Admitting: Pharmacist

## 2016-12-10 NOTE — Patient Outreach (Signed)
[facility]) Care Management  12/10/2016  Christy May 02-03-1951 [id number]   Patient was called to follow up on pill packing.  HIPAA identifiers were obtained.  Patient said she had not heard anything from her provider's office.  She also expressed some concerns about the papework she received from [place].  Patient's provider was called again to request new prescriptions be sent to [facility] so the patient can begin to get her medications delivered in pill packaging.  Another message was left with the front office staff person who confirmed they did receive my fax on 11/26/16.   Patient was encouraged to schedule an office visit with both her PCP and Pulmonologist but she did not want to make the appointments at this time.   Plan:  Follow up with the patient and provider if I have not heard back from them in 5-7 business days.  Elayne Guerin, PharmD, [place] Clinical Pharmacist [phone]

## 2016-12-15 ENCOUNTER — Other Ambulatory Visit: Payer: Self-pay | Admitting: *Deleted

## 2016-12-15 ENCOUNTER — Telehealth: Payer: Self-pay | Admitting: Emergency Medicine

## 2016-12-15 NOTE — Telephone Encounter (Signed)
Called and spoke with pt and she stated that she had the nurse visit today and this is in the pts chart.  She stated that she will come in tomorrow to see RB for this acute issue that she is having.

## 2016-12-15 NOTE — Patient Outreach (Signed)
Christy May) Care Management   12/15/2016  Christy May [birth year] [id number]  Christy May is an 66 y.o. female  Subjective:   Member alert and oriented x3, complains of pain as she has history of fibromyalgia.  She also complains of continued cough and clear sputum production.  She state she has completed her course of antibiotics but feel she will need more.  She placed call to pulmonology office to report concerns, awaiting call back to advise next steps.  Objective:   Review of Systems  HENT: Positive for congestion.   Eyes: Negative.   Respiratory: Positive for cough and sputum production.   Cardiovascular: Negative.   Gastrointestinal: Negative.   Genitourinary: Negative.   Musculoskeletal: Positive for myalgias.       Fibromyalgia  Skin: Negative.   Neurological: Positive for weakness.  Endo/Heme/Allergies: Negative.   Psychiatric/Behavioral: Negative.     Physical Exam  Constitutional: She is oriented to person, place, and time. She appears well-developed and well-nourished.  Neck: Normal range of motion.  Cardiovascular: Normal rate, regular rhythm and normal heart sounds.   Respiratory: Effort normal and breath sounds normal.  GI: Soft. Bowel sounds are normal.  Musculoskeletal: Normal range of motion.  Neurological: She is alert and oriented to person, place, and time.  Skin: Skin is warm and dry.   BP 138/76 (BP Location: Right Arm, Patient Position: Sitting, Cuff Size: Normal)   Pulse 78   Resp 20   SpO2 97%   Encounter Medications:   Outpatient Encounter Prescriptions as of 12/15/2016  Medication Sig Note  . albuterol (PROVENTIL) (2.5 MG/3ML) 0.083% nebulizer solution Take 2.5 mg by nebulization every 6 (six) hours as needed for wheezing or shortness of breath.   . allopurinol (ZYLOPRIM) 300 MG tablet Take 300 mg by mouth daily.   Marland Kitchen aspirin EC 81 MG tablet Take 81 mg by mouth at bedtime.   . chlorpheniramine (CHLORPHEN) 4 MG  tablet Take 4 mg by mouth daily.   . cholecalciferol (VITAMIN D) 1000 UNITS tablet Take 2,000 Units by mouth daily.    . Cinnamon 500 MG capsule Take 1,000 mg by mouth daily.   . Cyanocobalamin (VITAMIN B 12 PO) Take 500 mcg by mouth daily.   Marland Kitchen dextromethorphan (DELSYM) 30 MG/5ML liquid Take 15 mg by mouth 2 (two) times daily.   . fluticasone (VERAMYST) 27.5 MCG/SPRAY nasal spray Place 2 sprays into the nose daily.   . folic acid (FOLVITE) 1 MG tablet TAKE 4 TABLETS(4 MG) BY MOUTH DAILY   . gabapentin (NEURONTIN) 800 MG tablet Take 1 tablet (800 mg total) by mouth 3 (three) times daily.   Marland Kitchen HYDROcodone-acetaminophen (NORCO/VICODIN) 5-325 MG tablet Take 1 tablet every 8-12 hours as needed for pain 11/19/2016: PRN extreme pain only  . HYDROcodone-homatropine (HYCODAN) 5-1.5 MG/5ML syrup Take 5 mLs by mouth every 6 (six) hours as needed for cough.   . insulin NPH Human (NOVOLIN N RELION) 100 UNIT/ML injection Inject 1.1 mLs (110 Units total) into the skin every morning.   . Insulin Syringe-Needle U-100 (INSULIN SYRINGE 1CC/31GX5/16") 31G X 5/16" 1 ML MISC USE TO INJECT INSULIN TWICE DAILY   . ipratropium (ATROVENT) 0.03 % nasal spray Place 2 sprays into both nostrils 3 (three) times daily as needed for rhinitis.   Marland Kitchen irbesartan (AVAPRO) 300 MG tablet Take 300 mg by mouth daily.   Marland Kitchen loratadine (CLARITIN) 10 MG tablet Take 10 mg by mouth daily.   . metoprolol (LOPRESSOR) 50 MG tablet  Take 75 mg by mouth 2 (two) times daily.   . montelukast (SINGULAIR) 10 MG tablet TAKE 1 TABLET(10 MG) BY MOUTH DAILY   . nystatin cream (MYCOSTATIN) Apply 1 application topically 2 (two) times daily as needed for dry skin.   . ONE TOUCH ULTRA TEST test strip TEST BLOOD SUGAR TWICE DAILY   . oxybutynin (DITROPAN-XL) 10 MG 24 hr tablet Take 1 tablet (10 mg total) by mouth daily.   . pantoprazole (PROTONIX) 40 MG tablet TAKE 1 TABLET(40 MG) BY MOUTH TWICE DAILY   . predniSONE (DELTASONE) 20 MG tablet Take 1 tablet (20 mg  total) by mouth daily with breakfast.   . ranitidine (ZANTAC) 300 MG tablet Take 300 mg by mouth at bedtime.   . sodium chloride (OCEAN) 0.65 % SOLN nasal spray Place 2 sprays into both nostrils 2 (two) times daily.   . traMADol (ULTRAM) 50 MG tablet TAKE 1-2 TABLETS EVERY 4 HOURS AS NEED FOR COUGH OR PAIN   . Turmeric Curcumin 500 MG CAPS Take 1 capsule by mouth daily.   . vitamin C (ASCORBIC ACID) 500 MG tablet Take 500 mg by mouth daily.   . vitamin E 400 UNIT capsule Take 400 Units by mouth daily.   Marland Kitchen azithromycin (ZITHROMAX Z-PAK) 250 MG tablet Take one tablet daily for 7 days. (Patient not taking: Reported on 12/15/2016)   . ciprofloxacin (CILOXAN) 0.3 % ophthalmic solution Place 1 drop into both eyes daily.   . ILEVRO 0.3 % ophthalmic suspension Place 1 drop into the right eye daily.   . prednisoLONE acetate (PRED FORTE) 1 % ophthalmic suspension Place 1 drop into both eyes 4 (four) times daily.    No facility-administered encounter medications on file as of 12/15/2016.     Functional Status:   In your present state of health, do you have any difficulty performing the following activities: 11/24/2016 11/17/2016  Hearing? N N  Vision? N N  Difficulty concentrating or making decisions? N N  Walking or climbing stairs? Y Y  Dressing or bathing? Y Y  Doing errands, shopping? N N  Preparing Food and eating ? N N  Using the Toilet? Y Y  In the past six months, have you accidently leaked urine? Y Y  Do you have problems with loss of bowel control? Y Y  Managing your Medications? N N  Managing your Finances? N N  Housekeeping or managing your Housekeeping? Y Y  Some recent data might be hidden    Fall/Depression Screening:    Fall Risk  11/24/2016 11/11/2016 04/19/2014  Falls in the past year? Yes Yes Yes  Number falls in past yr: 2 or more 2 or more 2 or more  Injury with Fall? No No -  Risk Factor Category  High Fall Risk High Fall Risk (No Data)  Risk for fall due to : History of  fall(s);Impaired balance/gait;Impaired mobility;Medication side effect;Impaired vision History of fall(s);Impaired balance/gait;Impaired mobility;Medication side effect;Impaired vision Impaired mobility  Follow up Education provided;Falls prevention discussed Falls evaluation completed;Education provided -   [facility] 2/9 Scores 11/24/2016 11/11/2016 01/25/2015 04/19/2014 04/19/2014  PHQ - 2 Score 0 0 0 0 1    Assessment:    Met with member at scheduled time.  She immediately begins to talk about being overwhelmed, stating she is just getting out of bed and still has not taken her medication for the day.  She does not have her pill box filled, but fills it during this assessment.  She is currently  working with [facility] pharmacist, K. [name], for medication management in hopes of using pill packaging.  She report compliance with all medications.  Her diabetes remain controlled, today's reading 108, averages improved from last month.  Remains active with endocrinologist for titration of insulin as needed.  Proper diabetic diet discussed as she is not 100% complaint.  She expresses concern again regarding being overwhelmed with her healthcare and home life, requesting in home assistance.  Home with large amount of clutter and disorganization.  She is advised that information about this topic was provided by Education officer, museum, J. Saporito.  She state she has not read all information that was sent because it was "too much information."  She is advised to contact LCSW directly to discuss concern regarding amount of information and ongoing need for in home care.    This care manager inquired about nursing needs as all chronic conditions are controlled and her stated needs are related to medication management and personal care assistance.  She denies any other concerns but continue to state she is "overwhelmed."  Advised again to contact Mrs. Saporito, notified that Mrs. [name] will contact her regarding medication  management.  Plan:   Will present case to interdisciplinary team for additional input on plan of care.  Will follow up with member within 2 weeks.  If still remain without nursing needs, will close case.  [name], [place], MSN [place] [phone]

## 2016-12-15 NOTE — Telephone Encounter (Signed)
Pt returned phone call, pt contact # [phone].Christy May

## 2016-12-15 NOTE — Telephone Encounter (Signed)
She will need to be seen and evaluated, especially since already on Pred, at risk for opportunistic infection.

## 2016-12-15 NOTE — Telephone Encounter (Signed)
Left message for patient to call back  

## 2016-12-15 NOTE — Telephone Encounter (Signed)
Spoke with pt, who reports of prod cough with clear mucus, occ wheezing, mild chest congestion, nasal drainage clear in color. Denies fever, chills or sweats x1wk. Pt hasn't taken any OTC meds to help with symptoms. Pt currently on 20mg  of prednisone daily.  RB please advise. Thanks.

## 2016-12-16 ENCOUNTER — Ambulatory Visit (INDEPENDENT_AMBULATORY_CARE_PROVIDER_SITE_OTHER): Payer: PPO | Admitting: Emergency Medicine

## 2016-12-16 ENCOUNTER — Encounter: Payer: Self-pay | Admitting: Emergency Medicine

## 2016-12-16 DIAGNOSIS — R05 Cough: Secondary | ICD-10-CM | POA: Diagnosis not present

## 2016-12-16 DIAGNOSIS — G4733 Obstructive sleep apnea (adult) (pediatric): Secondary | ICD-10-CM | POA: Diagnosis not present

## 2016-12-16 DIAGNOSIS — R058 Other specified cough: Secondary | ICD-10-CM

## 2016-12-16 MED ORDER — HYDROCODONE-HOMATROPINE 5-1.5 MG/5ML PO SYRP: 5.0000 mL | ORAL_SOLUTION | Freq: Four times a day (QID) | ORAL | 0 refills | Status: DC | PRN

## 2016-12-16 NOTE — Assessment & Plan Note (Signed)
Continue CPAP qhs 

## 2016-12-16 NOTE — Patient Instructions (Signed)
Fluticasone nasal spray, 2 sprays each nostril daily Atrovent nasal spray 2 sprays each nostril 2-3 times daily Continue prednisone 20 mg daily Nasal saline spray 1-2 times daily Singulair 10 mg every evening Chlorpheniramine 4 mg up to every 6 hours as needed for nasal congestion Loratadine 10 mg daily Protonix 40 mg twice a day. Take this 30-60 minutes before or after eating. Ranitidine 300 mg at bedtime.  Continue Neurontin and Ultram as you have been taking them Hycodan cough syrup refilled. Use 5cc up to every 6 hours as needed for cough.  Continue to use CPAP reliably every night Follow with Dr Lamonte Sakai in 3 months or sooner if you have any problems.

## 2016-12-16 NOTE — Assessment & Plan Note (Signed)
Fluticasone nasal spray, 2 sprays each nostril daily Atrovent nasal spray 2 sprays each nostril 2-3 times daily Continue prednisone 20 mg daily Nasal saline spray 1-2 times daily Singulair 10 mg every evening Chlorpheniramine 4 mg up to every 6 hours as needed for nasal congestion Loratadine 10 mg daily Protonix 40 mg twice a day. Take this 30-60 minutes before or after eating. Ranitidine 300 mg at bedtime.  Continue Neurontin and Ultram as you have been taking them Hycodan cough syrup refilled. Use 5cc up to every 6 hours as needed for cough.  Continue to use CPAP reliably every night Follow with Dr Lamonte Sakai in 3 months or sooner if you have any problems.

## 2016-12-16 NOTE — Progress Notes (Signed)
   Subjective:    Patient ID: Christy May, female    DOB: 1950-08-19, 66 y.o.   MRN: [id number]  HPI 66 yo woman, former smoker with obesity, OSA, HTN on irbesartan, DM, childhood asthma. Carries a dx of adult asthma although PFT 2015 most consistent w restriction and variable UA irritation on inspiratory loop. She has done speech rx and training for VCD before. Contributing factors are chronic rhinitis, GERD. She has felt that albuterol has helped her in the past.   ROV 11/11/16 - patient has a history of upper airway irritation vocal cord dysfunction, probably some superimposed asthma, obstructive sleep apnea, hypertension, chronic rhinitis and GERD. She was seen one month ago for an exacerbation of her cough. She has used Hycodan to suppress the cough; there has been some question about whether she has over filled prescriptions for her chronic cough syrup in the past.. She is on Protonix twice a day, Pepcid, nasal saline spray, Singulair, Claritin, Atrovent nasal spray. She had been doing quite well for the last month, was exposed to URI and developed cold sx about 2 days ago. She is using hycodan prn. She is wearing her CPAP reliably.   Acute OV 12/15/16 -- Christy May presents today with increased coughing, HA, more head and eye drainage. Throat burning. Has been happening since beginning of June. She was treated with azithromycin 11/25/16.  Current regimen includes nasal saline, ranitidine 300 mg at bedtime, protonix 40 mg twice a day, Singulair 10 mg daily, loratadine 10 mg daily, Neurontin, flonase daily, atrovent NS, chlorpheniramine 4 mg up to every 6 hours, prednisone 20 g daily. She had been taking hycodan with some improvement, ran out 2 days ago.     Review of Systems As per HPI     Objective:   Physical Exam Vitals:   12/16/16 1114 12/16/16 1115  BP:  136/76  Pulse:  80  SpO2:  94%  Weight: (!) 315 lb (142.9 kg)   Height: 5' 4.25" (1.632 m)     GEN: A/Ox3; depressed  affect, obese woman in no distress   HEENT:  OP clear, hoarse voice. No postnasal drip evident  NECK:  No stridor  RESP  Clear bilaterally, no wheeze on forced expiration  CARD:  RRR, no m/r/g, trace edema.   Musco: Warm bil, no deformities or joint swelling noted.   Neuro: alert, no focal deficits noted.    Skin: Warm, no lesions or rashes       Assessment & Plan:  Obstructive sleep apnea Continue CPAP qhs  Upper airway cough syndrome Fluticasone nasal spray, 2 sprays each nostril daily Atrovent nasal spray 2 sprays each nostril 2-3 times daily Continue prednisone 20 mg daily Nasal saline spray 1-2 times daily Singulair 10 mg every evening Chlorpheniramine 4 mg up to every 6 hours as needed for nasal congestion Loratadine 10 mg daily Protonix 40 mg twice a day. Take this 30-60 minutes before or after eating. Ranitidine 300 mg at bedtime.  Continue Neurontin and Ultram as you have been taking them Hycodan cough syrup refilled. Use 5cc up to every 6 hours as needed for cough.  Continue to use CPAP reliably every night Follow with Dr Lamonte Sakai in 3 months or sooner if you have any problems.  Baltazar Apo, MD, PhD 12/16/2016, 11:39 AM [facility] [phone] or if no answer [phone]

## 2016-12-17 ENCOUNTER — Other Ambulatory Visit: Payer: Self-pay | Admitting: Pharmacist

## 2016-12-17 NOTE — Patient Outreach (Signed)
[facility] [facility]) Care Management  12/17/2016  Christy May Jul 26, 1950 [id number]   Patient's primary care provider's office (Dr. Criss Rosales) was called. I spoke with a nurse [name]) who said Dr. Criss Rosales read my note and will not send any prescriptions for pill packing without the patient coming into the office and bring all of her medication bottles for a full medication review by Dr. Criss Rosales.   Patient was called to ask her to schedule an appointment. Unfortunately, she did not answer. HIPAA compliant message left on the patient's voicemail.     Plan:  Await a call back from the patient.  Call patient back in 5-7 business days.  Elayne Guerin, PharmD, [place] Clinical Pharmacist [phone]

## 2016-12-25 ENCOUNTER — Other Ambulatory Visit: Payer: Self-pay | Admitting: Internal Medicine

## 2016-12-25 ENCOUNTER — Telehealth: Payer: Self-pay | Admitting: Emergency Medicine

## 2016-12-25 ENCOUNTER — Other Ambulatory Visit: Payer: Self-pay | Admitting: Pharmacist

## 2016-12-25 DIAGNOSIS — G4733 Obstructive sleep apnea (adult) (pediatric): Secondary | ICD-10-CM

## 2016-12-25 NOTE — Patient Outreach (Signed)
[place] [facility]) Care Management  12/25/2016  Christy May 02/03/1951 [id number]   Patient was called to follow up on getting her into pill packing.  HIPAA identifiers were obtained.  Patient shared that her mother-in-law passed. She was offered condolences. She was also informed Dr. Criss Rosales would not call in new prescriptions for her to get pill packing until she came in for an office visit and medication review.  Dr. Fransico Setters nurse confirmed they received my notes and phone calls but will not address any issues without the patient scheduling an appointment.  Patient said she would schedule an appointment after the funeral.  Plan:  I will follow up with the patient in 3 weeks.  Elayne Guerin, PharmD, [place] Clinical Pharmacist [phone]

## 2016-12-25 NOTE — Telephone Encounter (Signed)
Called and spoke with pt and she stated that [facility] didn't charge her insurance back in 2016 for her cpap and when they finally went to do this, the insurance would not cover it since it had been so long.  She stated that [facility] is now wanting her to pay off this debt and she cannot do it due to it being so much.  They will not service her since this debt has not been paid in full.  She is wanting to change DME company.  RB please advise. Thanks

## 2016-12-28 NOTE — Telephone Encounter (Signed)
Order has been placed for the pt to change DME for her cpap.  Will forward to RB to make him aware.

## 2016-12-30 ENCOUNTER — Other Ambulatory Visit: Payer: Self-pay | Admitting: Internal Medicine

## 2017-01-01 ENCOUNTER — Other Ambulatory Visit: Payer: Self-pay | Admitting: *Deleted

## 2017-01-01 NOTE — Patient Outreach (Signed)
[place] [facility]) Care Management  01/01/2017  Christy May 08-Jan-1951 [id number]   Member discussed with interdisciplinary team to collaborate plan of care.  She has been offered assistance from nursing, pharmacy, and social work.  Her concerns are in reference to assistance in the home and medication management, no nursing needs at this time.  She has not followed recommendations from Education officer, museum or pharmacist as of yet.  Per [employer] request, pharmacist and social worker will await call back form her when she is ready to proceed with care.  This care manager will close case as no nursing needs identified.    Call placed to member to discuss case closure, no answer.  HIPAA compliant voice message left.  Will await call back, will follow up next week if no call back.  [name], [place], MSN [place] [phone]

## 2017-01-07 ENCOUNTER — Other Ambulatory Visit: Payer: Self-pay | Admitting: *Deleted

## 2017-01-07 NOTE — Patient Outreach (Addendum)
[place] [facility]) Care Management  01/07/2017  Christy May 03-14-1951 [id number]   Call placed to notify member of nursing discipline closure, no answer.  HIPAA compliant voice message left.  Will send member letter stating nursing will close, pharmacy remains open pending contact from member and visit to MD office.  Will send MD discipline closure letter.      Update @ 1600:  Call received back from member.  She denies scheduling appointment with primary MD, state she has been busy with the death of her mother in law.  She is advised that K. [name] [facility] pharmacist, is anticipating working with her on medication management, but she is not able to move forward until visit with Dr. Criss Rosales is complete.  She state she will contact office and schedule appointment when this call has ended.  Member expresses concern again regarding needing assistance in the home with ADLs.  Advised to contact social worker to discuss options, but reminded that she will be responsible for the cost.  She state she is looking to switch back to regular Medicare so that services will be paid for.  Re-educated that personal care services are paid for only for Medicaid patients, and she is not eligible.  She expresses frustration regarding not being able to pay out of pocket and the cost of copays with specialists.  She is advised that social worker can assist her on budgeting, but assistance with paying copays is not allowed.  Advised again to review paperwork from social worker and contact her.  She is made aware that she has not expressed any nursing needs and [facility] pharmacist will remain involved after visit with Dr. Criss Rosales as well as assistance with social worker when she is ready.  No other concerns at this time.  Letters already sent.   [name], [place], MSN [place] [phone]

## 2017-01-08 ENCOUNTER — Ambulatory Visit: Payer: Self-pay | Admitting: *Deleted

## 2017-01-12 DIAGNOSIS — G4730 Sleep apnea, unspecified: Secondary | ICD-10-CM | POA: Diagnosis not present

## 2017-01-12 DIAGNOSIS — E1342 Other specified diabetes mellitus with diabetic polyneuropathy: Secondary | ICD-10-CM | POA: Diagnosis not present

## 2017-01-12 DIAGNOSIS — I110 Hypertensive heart disease with heart failure: Secondary | ICD-10-CM | POA: Diagnosis not present

## 2017-01-12 DIAGNOSIS — I517 Cardiomegaly: Secondary | ICD-10-CM | POA: Diagnosis not present

## 2017-01-13 ENCOUNTER — Telehealth: Payer: Self-pay | Admitting: Emergency Medicine

## 2017-01-13 MED ORDER — PREDNISONE 10 MG PO TABS: ORAL_TABLET | ORAL | 0 refills | Status: DC

## 2017-01-13 MED ORDER — AZITHROMYCIN 250 MG PO TABS: ORAL_TABLET | ORAL | 0 refills | Status: DC

## 2017-01-13 NOTE — Telephone Encounter (Signed)
VCD v asthma falre  Plan z pak Please take prednisone 40 mg x1 day, then 30 mg x1 day, then 20 mg x1 day, then 10 mg x1 day, and then 5 mg x1 day and stop    Allergies  Allergen Reactions  . Ace Inhibitors Anaphylaxis and Cough  . Penicillins Hives and Other (See Comments)    Has patient had a PCN reaction causing immediate rash, facial/tongue/throat swelling, SOB or lightheadedness with hypotension:  No Has patient had a PCN reaction causing severe rash involving mucus membranes or skin necrosis:  No Has patient had a PCN reaction that required hospitalization No Has patient had a PCN reaction occurring within the last 10 years: No If all of the above answers are "NO", then may proceed with Cephalosporin use.  . Adhesive [Tape] Hives

## 2017-01-13 NOTE — Telephone Encounter (Signed)
Called and spoke to pt. Informed her of the recs per MR. Rx sent to preferred pharmacy. Pt verbalized understanding and denied any further questions or concerns at this time.

## 2017-01-13 NOTE — Telephone Encounter (Signed)
Spoke with pt, who reports of increased sob, wheezing, prod cough with white mucus, chest burning & increased fatigued x3d Pt states she seen PCP on 7/25, who recommended she contact [employer].  Pt using ventolin 2-3 times daily with mild relief. Denies fever, chills or sweats.    MR please advise, as RB is unavailable. Thanks.

## 2017-01-14 ENCOUNTER — Other Ambulatory Visit: Payer: Self-pay | Admitting: *Deleted

## 2017-01-14 NOTE — Patient Outreach (Signed)
[place] [facility]) Care Management  01/14/2017  ETHELINE GEPPERT Sep 27, 1950 [id number]   Call received from member stating that her visit with her primary MD was complete.  She is advised to contact [facility] pharmacist, K. [name], to continue collaboration for medication management.  She state that Dr. Criss Rosales was concerned about her current health status (wheezing/coughing) and advised her to contact Dr. Lamonte Sakai for appointment and plan of care.  She report she complied and received a prednisone taper as well as antibiotic.  Follow up appointment with Dr. Criss Rosales in 2 weeks.  She continue to voice interest in personal care assistance, reminded that she will have to pay out of pocket, state she is unable to at this time.  Advised again that she is not eligible for Medicaid.  She will continue to work with Abbey Chatters for medication management.  Member expresses concern regarding nursing discipline closure.  She report needing nursing assistance, but unable to verbalize nursing needs.  Her blood sugars are currently elevated, but she is aware that this is a result of the prednisone.  A1C remains less than 8.  Most recent blood pressures are well controlled.  She is compliant with her CPAP for sleep apnea, other chronic condition is chronic pain.  Although conditions are controlled, she still request nursing assistance, stating she would like to lose weight.  Discussed having involvement through health coach as other conditions are stable, she agrees.  She request this care manager make home visit to re-educate on use of home blood pressure monitor.  Will make visit next week for education, will place referral to health coach after visit.  Will not reopen case to community.  [name], [place], MSN [place] [phone]

## 2017-01-15 ENCOUNTER — Other Ambulatory Visit: Payer: Self-pay | Admitting: Pharmacist

## 2017-01-15 NOTE — Patient Outreach (Signed)
[facility]) Care Management  01/15/2017  Christy May 1950/12/31 [id number]   Called patient to follow up on medication assistance.  HIPAA identifiers were obtained.  Patient reported that she went to her provider's office as requested and is interested in having her prescriptions provided to her in pill packing.    Patient expressed some concern about the cost of some of her medications and said she spoke with someone at [facility]. Patient was interested in going back to regular Medicare and having a separate Part D plan.  Patient was told I could not advise her on that and to go with what she was told with [facility].  One of the medications the patient expressed concern about the cost of was Restasis.  [employer] was called.  It was determined the patient has Level 4 Extra Help.  Which means:  People with Medicare (no Medicaid or MSP) whose income is between 135% of FPL and 150% of FPL and who have limited resources.   LIS benefit up to 75% of the monthly benchmark premium and:  15% of the prescription costs up to $4,950 out-of pocket  Co-payments limited to $3.30 for each generic drug and $8.25 for each name brand drug after the annual prescription costs exceed $4,950 out-of-pocket spending  I called [facility] to check on how they do the billing for Pill Packs for patient's who are not Dual or Full LIS.  Unfortunately, they will not set up a charge account for patients not on Full LIS or are Dual.  The patient would have to pay for the copays of each medication in the pack at the time of filling. Currently, the patient does not get all of her medications filled at one time.  After discussing the payment with the patient, she decided to speak with [facility] and switch to [facility] so she can get some of the generic medications at no cost.  Plan: Home visit scheduled with patient after her next follow up with Dr. Criss Rosales 01/27/17  We will NOT  proceed with pill packs at this time.  Elayne Guerin, PharmD, [place] Clinical Pharmacist [phone]

## 2017-01-18 DIAGNOSIS — G4733 Obstructive sleep apnea (adult) (pediatric): Secondary | ICD-10-CM | POA: Diagnosis not present

## 2017-01-19 ENCOUNTER — Other Ambulatory Visit: Payer: Self-pay | Admitting: *Deleted

## 2017-01-19 NOTE — Patient Outreach (Signed)
[place] [facility]) Care Management  01/19/2017  LILLYMAE DUET 1950-09-14 [id number]   Arrived at [facility] home for scheduled home visit, no answer to door after ringing doorbell several times.  Call then placed to member, no answer.  Unable to leave a message.  Will proceed with referral to health coach for continued management of diabetes and weight management.  Will follow up with phone call next week.  [name], MSN, RN [facility] Care Management  [facility] Manager [phone]

## 2017-01-28 ENCOUNTER — Other Ambulatory Visit: Payer: Self-pay | Admitting: Internal Medicine

## 2017-02-01 ENCOUNTER — Other Ambulatory Visit: Payer: Self-pay | Admitting: *Deleted

## 2017-02-01 ENCOUNTER — Ambulatory Visit: Payer: Self-pay | Admitting: *Deleted

## 2017-02-01 NOTE — Patient Outreach (Signed)
[place] [facility]) Care Management  02/01/2017  Christy May 1951/03/19 [id number]   Call placed to member to follow up on missed home visit to provide education regarding use of blood pressure monitor, no answer.  Referral placed to health coach for diabetes management, focusing on diet and weight loss.  Case remain closed to nursing at this time.  [name], [place], MSN [place] [phone]

## 2017-02-03 ENCOUNTER — Other Ambulatory Visit: Payer: Self-pay | Admitting: Pharmacist

## 2017-02-03 ENCOUNTER — Encounter: Payer: Self-pay | Admitting: *Deleted

## 2017-02-03 ENCOUNTER — Other Ambulatory Visit: Payer: Self-pay | Admitting: *Deleted

## 2017-02-03 NOTE — Patient Outreach (Signed)
[place] [facility]) Care Management  02/03/2017  [name] 1950/11/11 [id number]   Call received from member stating she has been out of town helping to care for her niece who recently had surgery.  She apologizes for missed home visit and unsuccessful phone attempts.  She is made aware that referral to health coach has already been placed, advised to await contact for continued involvement with [facility].  She is also advised to contact her primary MD office to schedule a time with the nurse to provide instructions for proper use of her blood pressure monitor. This care manager attempted to do so during home visit where member was a no show, however education already provided for monitor during initial home visit.  She verbalizes understanding.  She state she think she had a missed call from [facility] pharmacist, K. [name] as well.  Advised to contact her regarding medication management.  Case will remain closed to community nursing.  [name], [place], MSN [place] [phone]

## 2017-02-03 NOTE — Patient Outreach (Signed)
[place] [facility]) Care Management  02/03/2017  Christy May 11-Jul-1950 [id number]   Patient left a message on my voicemail.  Patient was called back. HIPAA identifiers were obtained.  During       a telephone conversation on 01/15/17, patient stated she was in the process of switching insurances and a decision was made not to proceed with pill packing at that time.    Patient said she was going to call [facility] right after our phone converation on 01/15/17.  The patient reported she did not call [facility] and has not changed her insurance yet but is still planning on it.  It was explained to the patient that she needed to decide because [place] does not do pill packing but the patient would be able to receive her generic medications at no cost.   Home visit will be completed on 02/10/17 to assess patient's medications.  The last office note was requested from Dr. Criss Rosales   Elayne Guerin, PharmD, [place] Clinical Pharmacist [phone]

## 2017-02-10 ENCOUNTER — Other Ambulatory Visit: Payer: Self-pay | Admitting: Pharmacist

## 2017-02-10 NOTE — Patient Outreach (Signed)
[facility]) Care Management  [place]   02/10/2017  FATOUMATA ALBAUGH Apr 25, 1951 [id number]   Subjective: Home visit completed at patient's home.  HIPAA identifiers were obtained.  Patient is a 66 year old female with multiple medical conditions including but not limited to: Hypertension, OSA, constipation, GERD, diabetes, DJD, depression, morbid obesity and type 2 diabetes.  Patient reported managing her own medications and using a pill box. Upon reviewing her pillbox, we found that she had placed metoprolol tartrate 50mg , rather than 75mg , for her Thursday morning and evening doses.   Last PCP visit with Dr. Criss Rosales on 01/26/2017.  Objective: Self-monitoring fasting blood sugars: 8/15 156 8/16 166 8/17 118 8/18 162 8/19 142 8/20 126 8/21 196 8/22 98, 124 (noon)  Medications Reviewed Today    Reviewed by Shelle Iron, Student-PharmD (Student-PharmD) on 02/10/17 at 1701  Med List Status: <None>  Medication Order Taking? Sig Documenting Provider Last Dose Status Informant  acetaminophen (TYLENOL) 325 MG tablet [id number] Yes Take 325 mg by mouth every 6 (six) hours as needed. [provider] Taking Active            Med Note (Lamyah Creed   Wed Feb 10, 2017  1:41 PM) OTC  albuterol (PROVENTIL) (2.5 MG/3ML) 0.083% nebulizer solution [id number] Yes Take 2.5 mg by nebulization every 6 (six) hours as needed for wheezing or shortness of breath. [provider] Taking Active Self  allopurinol (ZYLOPRIM) 300 MG tablet [id number] Yes Take 300 mg by mouth daily. [provider] Taking Active Self  aspirin EC 81 MG tablet [id number] Yes Take 81 mg by mouth at bedtime. [provider] Taking Active Self  azithromycin (ZITHROMAX) 250 MG tablet [id number] Yes Take as directed Brand Males, MD Taking Active            Med Note (Dontavia Brand   Wed Feb 10, 2017  1:28 PM) Taking PRN  chlorpheniramine (CHLORPHEN) 4 MG tablet [id number]  Yes Take 4 mg by mouth daily. [provider] Taking Active Self  cholecalciferol (VITAMIN D) 1000 UNITS tablet [id number] Yes Take 2,000 Units by mouth daily.  [provider] Taking Active Self  Cinnamon 500 MG capsule [id number] Yes Take 1,000 mg by mouth daily. [provider] Taking Active Self  Cyanocobalamin (VITAMIN B 12 PO) [id number] Yes Take 500 mcg by mouth daily. [provider] Taking Active Self  cycloSPORINE (RESTASIS) 0.05 % ophthalmic emulsion [id number] Yes 1 drop 2 (two) times daily. [provider] Taking Active Self  dextromethorphan (DELSYM) 30 MG/5ML liquid [id number] Yes Take 15 mg by mouth 2 (two) times daily. [provider] Taking Active   fluconazole (DIFLUCAN) 150 MG tablet [id number] Yes Take 150 mg by mouth daily. [provider] Taking Active            Med Note (Lithzy Bernard   Wed Feb 10, 2017  1:41 PM) Taking PRN  fluticasone (VERAMYST) 27.5 MCG/SPRAY nasal spray [id number] Yes Place 2 sprays into the nose daily. Juanito Doom, MD Taking Active   folic acid (FOLVITE) 1 MG tablet [id number] Yes TAKE 4 TABLETS(4 MG) BY MOUTH DAILY Renato Shin, MD Taking Active   gabapentin (NEURONTIN) 800 MG tablet [id number] Yes Take 1 tablet (800 mg total) by mouth 3 (three) times daily. Biagio Borg, MD Taking Active Self  HYDROcodone-acetaminophen (NORCO/VICODIN) 5-325 MG tablet [id number] Yes Take 1 tablet every 8-12 hours as needed for pain Berle Mull, MD Taking Active  Med Note Regino Bellow Nov 19, 2016 12:24 PM) PRN extreme pain only  HYDROcodone-homatropine (HYCODAN) 5-1.5 MG/5ML syrup [id number] Yes Take 5 mLs by mouth every 6 (six) hours as needed for cough. Collene Gobble, MD Taking Active   insulin NPH Human (NOVOLIN N RELION) 100 UNIT/ML injection [id number] Yes Inject 1.1 mLs (110 Units total) into the skin every morning. Renato Shin, MD Taking Active   Insulin Syringe-Needle U-100  (INSULIN SYRINGE 1CC/31GX5/16") 31G X 5/16" 1 ML MISC [id number] Yes USE TO INJECT INSULIN TWICE DAILY Renato Shin, MD Taking Active   ipratropium (ATROVENT) 0.03 % nasal spray [id number] Yes Place 2 sprays into both nostrils 3 (three) times daily as needed for rhinitis. Collene Gobble, MD Taking Active   irbesartan (AVAPRO) 300 MG tablet [id number] Yes Take 300 mg by mouth daily. [provider] Taking Active Self  loratadine (CLARITIN) 10 MG tablet [id number] Yes Take 10 mg by mouth daily. [provider] Taking Active Self  metoprolol (LOPRESSOR) 50 MG tablet [id number] Yes Take 75 mg by mouth 2 (two) times daily. [provider] Taking Active Self  montelukast (SINGULAIR) 10 MG tablet [id number] No TAKE 1 TABLET(10 MG) BY MOUTH DAILY  Patient not taking:  Reported on 02/10/2017   Magdalen Spatz, NP Not Taking Active   nystatin cream (MYCOSTATIN) [id number] Yes Apply 1 application topically 2 (two) times daily as needed for dry skin. [provider] Taking Active Self  ONE TOUCH ULTRA TEST test strip [id number] Yes TEST BLOOD SUGAR TWICE DAILY Renato Shin, MD Taking Active   oxybutynin (DITROPAN-XL) 10 MG 24 hr tablet [id number] Yes Take 1 tablet (10 mg total) by mouth daily. Biagio Borg, MD Taking Active Self  pantoprazole (PROTONIX) 40 MG tablet [id number] Yes TAKE 1 TABLET(40 MG) BY MOUTH TWICE DAILY Byrum, Rose Fillers, MD Taking Active   predniSONE (DELTASONE) 20 MG tablet [id number] Yes Take 1 tablet (20 mg total) by mouth daily with breakfast. Collene Gobble, MD Taking Active   ranitidine (ZANTAC) 300 MG tablet [id number] Yes Take 300 mg by mouth at bedtime. [provider] Taking Active Self  sodium chloride (OCEAN) 0.65 % SOLN nasal spray [id number] Yes Place 2 sprays into both nostrils 2 (two) times daily. [provider] Taking Active Self  traMADol (ULTRAM) 50 MG tablet [id number] Yes TAKE 1-2 TABLETS EVERY 4 HOURS AS NEED FOR COUGH OR PAIN  Tanda Rockers, MD Taking Active   Turmeric Curcumin 500 MG CAPS [id number] Yes Take 1 capsule by mouth daily. [provider] Taking Active   vitamin C (ASCORBIC ACID) 500 MG tablet [id number] Yes Take 500 mg by mouth daily. [provider] Taking Active Self  vitamin E 400 UNIT capsule [id number] Yes Take 400 Units by mouth daily. [provider] Taking Active          Assessment:  Patient's medications were reconciled in the home with her medication bottles as well as a list of medications obtained from Dr. Fransico Setters office.   Drugs sorted by system:  Neurologic/Psychologic: Gabapentin  Cardiovascular: Aspirin Irbesartan Metoprolol tartrate  Pulmonary/Allergy: Albuterol nebulizer solution Chlorpheniramine Fluticasone Nasal Montelukast (reported not taking) Hydrocodone/Homatropine Syrup (Hyromet, Hycodan) Dextromethorphan Polistirex (Delsym) Ipratropium Nasal Spray Loratadine Saline Solution (nasal) Prednisone taper (completed) Prednisone 20mg  (daily/maintenance) Mometasone Nasal (reported not taking, but found on Dr. Fransico Setters medication list) Dexamethasone (reported not taking, but found on Dr. Fransico Setters medication list)  Gastrointestinal: Pantoprazole Ranitidine  Endocrine: Humulin N Jardiance (reported not  taking, but found on Dr. Fransico Setters medication list)  Renal: Allopurinol  Topical: Nystatin cream  Pain: Hydrocodone/APAP Tramadol Acetaminophen  Vitamins/Minerals: Ascorbic Acid Cinnamon Cyanocobalamin Vitamin E Cholecalciferol (vitamin D) Folic Acid Tumeric  Eye Ilevro eye drops (reported stopped taking) Prednisolone eye drops (reported stopped taking) Ciprofloxacin eye drops (reported stopped taking) Cyclosporine eye drops  Urinary Oxybutynin XL  Plan: - Follow up on fill history for Jardiance, and contact Dr. Criss Rosales regarding its discontinuation - Follow up in October once patient finalizes insurance

## 2017-02-16 ENCOUNTER — Ambulatory Visit: Payer: PPO | Admitting: Podiatry

## 2017-02-23 ENCOUNTER — Telehealth: Payer: Self-pay | Admitting: Emergency Medicine

## 2017-02-23 ENCOUNTER — Other Ambulatory Visit: Payer: Self-pay | Admitting: Emergency Medicine

## 2017-02-23 MED ORDER — PREDNISONE 20 MG PO TABS: 20.0000 mg | ORAL_TABLET | Freq: Every day | ORAL | 5 refills | Status: DC

## 2017-02-23 NOTE — Telephone Encounter (Signed)
Pt needs a refill on maintenance prednisone.  This has been sent to preferred pharmacy.  Pt also had a question about her Singulair- this has been addressed.  Will close encounter.

## 2017-02-24 ENCOUNTER — Telehealth: Payer: Self-pay | Admitting: Emergency Medicine

## 2017-02-24 NOTE — Telephone Encounter (Signed)
Left detailed msg for Christy May letting her know that pt's DM supply orders need to come from her PCP

## 2017-02-24 NOTE — Telephone Encounter (Signed)
Form has not been received. Will hold message to follow up on.

## 2017-02-25 NOTE — Telephone Encounter (Signed)
Form has been received. It has been corrected and faxed back. Nothing further was needed.

## 2017-03-02 ENCOUNTER — Other Ambulatory Visit: Payer: Self-pay | Admitting: Emergency Medicine

## 2017-03-02 ENCOUNTER — Other Ambulatory Visit: Payer: Self-pay | Admitting: *Deleted

## 2017-03-02 ENCOUNTER — Telehealth: Payer: Self-pay | Admitting: Emergency Medicine

## 2017-03-02 ENCOUNTER — Encounter: Payer: Self-pay | Admitting: *Deleted

## 2017-03-02 DIAGNOSIS — Z794 Long term (current) use of insulin: Secondary | ICD-10-CM | POA: Diagnosis not present

## 2017-03-02 DIAGNOSIS — E109 Type 1 diabetes mellitus without complications: Secondary | ICD-10-CM | POA: Diagnosis not present

## 2017-03-02 MED ORDER — HYDROCODONE-HOMATROPINE 5-1.5 MG/5ML PO SYRP: 5.0000 mL | ORAL_SOLUTION | Freq: Four times a day (QID) | ORAL | 0 refills | Status: DC | PRN

## 2017-03-02 MED ORDER — TRAMADOL HCL 50 MG PO TABS: 100.0000 mg | ORAL_TABLET | Freq: Four times a day (QID) | ORAL | 0 refills | Status: DC | PRN

## 2017-03-02 NOTE — Patient Outreach (Signed)
[facility]) Care Management  03/02/2017  Christy May June 25, 1950 [id number]   RN Health Coach telephone call to patient.  Hipaa compliance verified. Per patient she is on the prayer line and will call me back within possibly an hour.  [place] Care Management [phone]

## 2017-03-02 NOTE — Patient Outreach (Signed)
[facility]) Care Management  03/02/2017   Christy May [birth year] [id number]   RN Health Coach telephone call to patient.  Hipaa compliance verified. Per patient her fasting blood sugar was 109. Patient stated that when her blood sugar is high she has headaches . Per patient she drinks a lot of water and took a glucose tablet and takes 2 teaspoons of vinegar.  RN informed patient do not take a glucose tablet when she feels her blood sugar is high. RN explained he needs to check it. Per patient when her blood sugar is low she has headaches also. RN discussed symptoms of high and low blood sugar.  Per patient she is having some acid reflux. Patient has agreed to follow up outreach calls.    Current Medications:  Current Outpatient Prescriptions  Medication Sig Dispense Refill  . acetaminophen (TYLENOL) 325 MG tablet Take 325 mg by mouth every 6 (six) hours as needed.    Marland Kitchen albuterol (PROVENTIL) (2.5 MG/3ML) 0.083% nebulizer solution Take 2.5 mg by nebulization every 6 (six) hours as needed for wheezing or shortness of breath.    . allopurinol (ZYLOPRIM) 300 MG tablet Take 300 mg by mouth daily.    Marland Kitchen aspirin EC 81 MG tablet Take 81 mg by mouth at bedtime.    Marland Kitchen azithromycin (ZITHROMAX) 250 MG tablet Take as directed 6 tablet 0  . chlorpheniramine (CHLORPHEN) 4 MG tablet Take 4 mg by mouth daily.    . cholecalciferol (VITAMIN D) 1000 UNITS tablet Take 2,000 Units by mouth daily.     . Cinnamon 500 MG capsule Take 1,000 mg by mouth daily.    . Cyanocobalamin (VITAMIN B 12 PO) Take 500 mcg by mouth daily.    . cycloSPORINE (RESTASIS) 0.05 % ophthalmic emulsion 1 drop 2 (two) times daily.    Marland Kitchen dextromethorphan (DELSYM) 30 MG/5ML liquid Take 15 mg by mouth 2 (two) times daily.    . fluconazole (DIFLUCAN) 150 MG tablet Take 150 mg by mouth daily.    . fluticasone (VERAMYST) 27.5 MCG/SPRAY nasal spray Place 2 sprays into the nose daily. 10 g 2  . folic acid (FOLVITE) 1 MG  tablet TAKE 4 TABLETS(4 MG) BY MOUTH DAILY 120 tablet 0  . gabapentin (NEURONTIN) 800 MG tablet Take 1 tablet (800 mg total) by mouth 3 (three) times daily. 270 tablet 1  . HYDROcodone-acetaminophen (NORCO/VICODIN) 5-325 MG tablet Take 1 tablet every 8-12 hours as needed for pain    . HYDROcodone-homatropine (HYCODAN) 5-1.5 MG/5ML syrup Take 5 mLs by mouth every 6 (six) hours as needed for cough. 240 mL 0  . insulin NPH Human (NOVOLIN N RELION) 100 UNIT/ML injection Inject 1.1 mLs (110 Units total) into the skin every morning. 40 mL 11  . Insulin Syringe-Needle U-100 (INSULIN SYRINGE 1CC/31GX5/16") 31G X 5/16" 1 ML MISC USE TO INJECT INSULIN TWICE DAILY 100 each 0  . ipratropium (ATROVENT) 0.03 % nasal spray Place 2 sprays into both nostrils 3 (three) times daily as needed for rhinitis. 30 mL 5  . irbesartan (AVAPRO) 300 MG tablet Take 300 mg by mouth daily.    Marland Kitchen loratadine (CLARITIN) 10 MG tablet Take 10 mg by mouth daily.    . metoprolol (LOPRESSOR) 50 MG tablet Take 75 mg by mouth 2 (two) times daily.    . montelukast (SINGULAIR) 10 MG tablet TAKE 1 TABLET(10 MG) BY MOUTH DAILY (Patient not taking: Reported on 02/10/2017) 90 tablet 1  . nystatin cream (MYCOSTATIN) Apply 1 application  topically 2 (two) times daily as needed for dry skin.    . ONE TOUCH ULTRA TEST test strip TEST BLOOD SUGAR TWICE DAILY 100 each 11  . oxybutynin (DITROPAN-XL) 10 MG 24 hr tablet Take 1 tablet (10 mg total) by mouth daily. 90 tablet 1  . pantoprazole (PROTONIX) 40 MG tablet TAKE 1 TABLET(40 MG) BY MOUTH TWICE DAILY 60 tablet 2  . predniSONE (DELTASONE) 20 MG tablet Take 1 tablet (20 mg total) by mouth daily with breakfast. 30 tablet 5  . ranitidine (ZANTAC) 300 MG tablet Take 300 mg by mouth at bedtime.    . sodium chloride (OCEAN) 0.65 % SOLN nasal spray Place 2 sprays into both nostrils 2 (two) times daily.    . traMADol (ULTRAM) 50 MG tablet TAKE 1-2 TABLETS EVERY 4 HOURS AS NEED FOR COUGH OR PAIN  0  . Turmeric  Curcumin 500 MG CAPS Take 1 capsule by mouth daily.    . vitamin C (ASCORBIC ACID) 500 MG tablet Take 500 mg by mouth daily.    . vitamin E 400 UNIT capsule Take 400 Units by mouth daily.     No current facility-administered medications for this visit.     Functional Status:  In your present state of health, do you have any difficulty performing the following activities: 03/02/2017 11/24/2016  Hearing? N N  Vision? N N  Difficulty concentrating or making decisions? N N  Walking or climbing stairs? Y Y  Dressing or bathing? Y Y  Doing errands, shopping? N N  Preparing Food and eating ? N N  Using the Toilet? Y Y  In the past six months, have you accidently leaked urine? - Y  Do you have problems with loss of bowel control? Y Y  Managing your Medications? N N  Managing your Finances? N N  Housekeeping or managing your Housekeeping? Y Y  Some recent data might be hidden    Fall/Depression Screening: Fall Risk  03/02/2017 11/24/2016 11/11/2016  Falls in the past year? Yes Yes Yes  Number falls in past yr: 2 or more 2 or more 2 or more  Injury with Fall? No No No  Risk Factor Category  High Fall Risk High Fall Risk High Fall Risk  Comment - - -  Risk for fall due to : History of fall(s);Impaired balance/gait;Impaired mobility;Medication side effect;Impaired vision History of fall(s);Impaired balance/gait;Impaired mobility;Medication side effect;Impaired vision History of fall(s);Impaired balance/gait;Impaired mobility;Medication side effect;Impaired vision  Follow up Falls evaluation completed;Education provided;Falls prevention discussed Education provided;Falls prevention discussed Falls evaluation completed;Education provided   [facility] 2/9 Scores 03/02/2017 11/24/2016 11/11/2016 01/25/2015 04/19/2014 04/19/2014  PHQ - 2 Score 0 0 0 0 0 1   THN CM Care Plan Problem One     Most Recent Value  Care Plan Problem One  KNowledge Deficit in Self Management of Diabetes  Role Documenting the Problem One   [place] for Problem One  Active  THN Long Term Goal   Patient will  see a decrease in A1C with the next 90 days  THN Long Term Goal Start Date  03/02/17  Interventions for Problem One Long Term Goal  RN discussed what the  fasting blood glucose should be to help the A1C stay below 7. RN sent patient educational booklet living well with diabetes. RN will follow up with discussion and teach back  THN CM Short Term Goal #1   Patient will be able to verbalize then signs and symptoms of hypo  and hyperglycemia within the next 30 days  THN CM Short Term Goal #1 Start Date  03/02/17  Interventions for Short Term Goal #1  RN sent picture chart of faces for fhypo and hyperglycemia. RN will follow up with discussion and teach back  THN CM Short Term Goal #2   Patient will be able to verbalize action plan for hypo and hyperglycemia within the next 30 days  THN CM Short Term Goal #2 Start Date  03/02/17  Interventions for Short Term Goal #2  RN discussed action plan for hypo and hyperglycemia. RN sent written action plan for hypo and hyperglycemia. RN will follow up with further discussion and teach back  THN CM Short Term Goal #3  Patient will be able to verbalize healthy snack choices within the next 30 days  THN CM Short Term Goal #3 Start Date  03/02/17  Interventions for Short Tern Goal #3  RN discussed making healthy snack choices. RN sent a list of healthy snacks to choose from. RN will follow up with discussion and teach back  THN CM Short Term Goal #4  Patient will be able to verbalize making healthy food choices from food chains  THN CM Short Term Goal #4 Start Date  03/02/17  Interventions for Short Term Goal #4  RN sent a list of food choices from food chains. RN will follow up with discussion and teach back      Assessment:  Patient fasting blood sugar was 109 Patient will benefit from [place] telephonic outreach for education and support for diabetes self  management.  Plan:  RN sent education on portion control RN sent educational material on hypo and hyperglycemia RN sent educational material on food chain choices RN sent educational material acid reflux RN sent a  Booklet on Living well with diabetes RN will follow up within the month of October  Christy May [place] [facility] [phone]

## 2017-03-02 NOTE — Telephone Encounter (Signed)
Pt is aware and will pick up in AM. Placed at front for pick up and will bring ID.

## 2017-03-02 NOTE — Telephone Encounter (Signed)
Pt c/o prod cough with clear/white mucus, body aches, fatigue X2-3 days.   Denies fever, chest pain, sinus congestion. Pt has not taken anything to help with s/s.  Requesting cough syrup and tramadol.    Pt uses [facility] on [address].  RB please advise on recs.  Thanks.

## 2017-03-02 NOTE — Telephone Encounter (Signed)
Ok to call in tramadol 100mg  q6h prn fopr cough, # 30 no RF OK for hycodan 5cc q6h prn cough, 240c, no RF

## 2017-03-07 ENCOUNTER — Other Ambulatory Visit: Payer: Self-pay | Admitting: Emergency Medicine

## 2017-03-09 ENCOUNTER — Encounter: Payer: Self-pay | Admitting: Endocrinology

## 2017-03-09 ENCOUNTER — Ambulatory Visit (INDEPENDENT_AMBULATORY_CARE_PROVIDER_SITE_OTHER): Payer: PPO | Admitting: Endocrinology

## 2017-03-09 VITALS — BP 120/88 | HR 78 | Ht 64.25 in | Wt 322.0 lb

## 2017-03-09 DIAGNOSIS — N183 Chronic kidney disease, stage 3 unspecified: Secondary | ICD-10-CM

## 2017-03-09 DIAGNOSIS — Z794 Long term (current) use of insulin: Secondary | ICD-10-CM | POA: Diagnosis not present

## 2017-03-09 DIAGNOSIS — E1122 Type 2 diabetes mellitus with diabetic chronic kidney disease: Secondary | ICD-10-CM | POA: Diagnosis not present

## 2017-03-09 LAB — POCT GLYCOSYLATED HEMOGLOBIN (HGB A1C): Hemoglobin A1C: 8.8

## 2017-03-09 MED ORDER — INSULIN NPH (HUMAN) (ISOPHANE) 100 UNIT/ML ~~LOC~~ SUSP: 120.0000 [IU] | SUBCUTANEOUS | 11 refills | Status: DC

## 2017-03-09 NOTE — Progress Notes (Signed)
Subjective:    Patient ID: Christy May, female    DOB: January 31, 1951, 66 y.o.   MRN: [id number]  HPI Pt returns for f/u of diabetes mellitus:  DM type: Insulin-requiring type 2 Dx'ed: 4098 Complications: polyneuropathy and renal insufficiency.  Therapy: insulin since 2002.  GDM: never.  DKA: never.  Severe hypoglycemia: never.  Pancreatitis: never.  Other: she was changed to a qd insulin regimen, after poor results on multiple daily injections; she wants the cheapest possible insulin.  She declines weight loss surgery.   Interval history: no cbg record, but states cbg's vary from 53-400.  There is no trend throughout the day.  she says she never misses the insulin.  She still on prednisone now 20 mg qd.  She now takes 110 units qam.  no cbg record, but states cbg's are mildly low approx once a week.  This can happen any time of day.   Past Medical History:  Diagnosis Date  . Allergic bronchitis   . Allergy   . Anemia, unspecified   . Anxiety   . Arthritis    "neck, back, hands" (09/03/2014)  . Chronic airway obstruction, not elsewhere classified    "I was told I don't have this/tests done @ [facility] 05/2014"  . Chronic back pain   . Colon polyps   . Complication of anesthesia    "I've had recall; I probably stopped breathing at least 2 times" (09/03/2014)  . Dehydration   . Depressive disorder, not elsewhere classified   . Diabetic peripheral neuropathy ([place])   . Dysphagia 2007   historyof dysphagia with severe dysmotility by barium swallow-[name]  . Esophageal reflux   . Extrinsic asthma, unspecified    "I was told I don't have this/tests done @ [facility] 05/2014"  . Family history of adverse reaction to anesthesia    "my brother was told they lost him a couple times during OR"  . Headache    "weekly to monthly" (09/03/2014)  . Heart murmur   . History of hiatal hernia   . Hypertension   . Inflammatory and toxic neuropathy, unspecified   . Migraine    "couple  times/month right now" (09/03/2014)  . Obesity, unspecified   . OSA (obstructive sleep apnea)     CPAP  . Spondylosis of unspecified site without mention of myelopathy   . Type II diabetes mellitus ([place])   . Unspecified essential hypertension   . Unspecified menopausal and postmenopausal disorder   . Unspecified vitamin D deficiency     Past Surgical History:  Procedure Laterality Date  . ABDOMINAL HYSTERECTOMY     "partial"  . ANTERIOR CERVICAL DECOMP/DISCECTOMY FUSION     multiple cervical spine levels;Dr. Arnoldo Morale  . APPENDECTOMY    . BACK SURGERY    . CHOLECYSTECTOMY OPEN    . COLONOSCOPY N/A 05/08/2014   Procedure: COLONOSCOPY;  Surgeon: [name], MD;  Location: [facility];  Service: Endoscopy;  Laterality: N/A;  . DILATION AND CURETTAGE OF UTERUS    . ESOPHAGOGASTRODUODENOSCOPY N/A 05/08/2014   Procedure: ESOPHAGOGASTRODUODENOSCOPY (EGD);  Surgeon: [name], MD;  Location: [name];  Service: Endoscopy;  Laterality: N/A;  . EXPLORATORY LAPAROTOMY    . EYE SURGERY    . FOOT SURGERY Right X 2   "took blood out of my arms and put platelets in my feet"  . INCISION AND DRAINAGE ABSCESS     Chest  . JOINT REPLACEMENT    . REFRACTIVE SURGERY Bilateral   .  TONSILLECTOMY    . TOTAL KNEE ARTHROPLASTY Left 10/2003  . TOTAL KNEE ARTHROPLASTY Right 01/2004   [name]    Social History   Social History  . Marital status: Widowed    Spouse name: N/A  . Number of children: 1  . Years of education: N/A   Occupational History  . disabled Disabled   Social History Main Topics  . Smoking status: Former Smoker    Packs/day: 1.00    Years: 20.00    Types: Cigarettes    Quit date: 06/22/1986  . Smokeless tobacco: Never Used  . Alcohol use Yes  . Drug use: No  . Sexual activity: Not on file   Other Topics Concern  . Not on file   Social History Narrative   ** Merged History Encounter **       Patient does not get regular exercise.      Widowed 2004       Disabled    Current Outpatient Prescriptions on File Prior to Visit  Medication Sig Dispense Refill  . albuterol (PROVENTIL) (2.5 MG/3ML) 0.083% nebulizer solution Take 2.5 mg by nebulization every 6 (six) hours as needed for wheezing or shortness of breath.    . allopurinol (ZYLOPRIM) 300 MG tablet Take 300 mg by mouth daily.    Marland Kitchen aspirin EC 81 MG tablet Take 81 mg by mouth at bedtime.    . chlorpheniramine (CHLORPHEN) 4 MG tablet Take 4 mg by mouth daily.    . cholecalciferol (VITAMIN D) 1000 UNITS tablet Take 2,000 Units by mouth daily.     . Cyanocobalamin (VITAMIN B 12 PO) Take 500 mcg by mouth daily.    . cycloSPORINE (RESTASIS) 0.05 % ophthalmic emulsion 1 drop 2 (two) times daily.    . fluconazole (DIFLUCAN) 150 MG tablet Take 150 mg by mouth daily.    . fluticasone (FLONASE) 50 MCG/ACT nasal spray SHAKE LIQUID AND USE 2 SPRAYS IN EACH NOSTRIL DAILY 16 g 5  . fluticasone (VERAMYST) 27.5 MCG/SPRAY nasal spray Place 2 sprays into the nose daily. 10 g 2  . gabapentin (NEURONTIN) 800 MG tablet Take 1 tablet (800 mg total) by mouth 3 (three) times daily. 270 tablet 1  . HYDROcodone-acetaminophen (NORCO/VICODIN) 5-325 MG tablet Take 1 tablet every 8-12 hours as needed for pain    . HYDROcodone-homatropine (HYCODAN) 5-1.5 MG/5ML syrup Take 5 mLs by mouth every 6 (six) hours as needed for cough. 240 mL 0  . Insulin Syringe-Needle U-100 (INSULIN SYRINGE 1CC/31GX5/16") 31G X 5/16" 1 ML MISC USE TO INJECT INSULIN TWICE DAILY 100 each 0  . ipratropium (ATROVENT) 0.03 % nasal spray Place 2 sprays into both nostrils 3 (three) times daily as needed for rhinitis. 30 mL 5  . irbesartan (AVAPRO) 300 MG tablet Take 300 mg by mouth daily.    Marland Kitchen loratadine (CLARITIN) 10 MG tablet Take 10 mg by mouth daily.    . metoprolol (LOPRESSOR) 50 MG tablet Take 75 mg by mouth 2 (two) times daily.    . montelukast (SINGULAIR) 10 MG tablet TAKE 1 TABLET(10 MG) BY MOUTH DAILY 90 tablet 1  . nystatin cream (MYCOSTATIN)  Apply 1 application topically 2 (two) times daily as needed for dry skin.    . ONE TOUCH ULTRA TEST test strip TEST BLOOD SUGAR TWICE DAILY 100 each 11  . oxybutynin (DITROPAN-XL) 10 MG 24 hr tablet Take 1 tablet (10 mg total) by mouth daily. 90 tablet 1  . pantoprazole (PROTONIX) 40 MG tablet TAKE  1 TABLET(40 MG) BY MOUTH TWICE DAILY 60 tablet 2  . predniSONE (DELTASONE) 20 MG tablet Take 1 tablet (20 mg total) by mouth daily with breakfast. 30 tablet 5  . ranitidine (ZANTAC) 300 MG tablet Take 300 mg by mouth at bedtime.    . sodium chloride (OCEAN) 0.65 % SOLN nasal spray Place 2 sprays into both nostrils 2 (two) times daily.    . traMADol (ULTRAM) 50 MG tablet Take 2 tablets (100 mg total) by mouth every 6 (six) hours as needed. TAKE 1-2 TABLETS EVERY 4 HOURS AS NEED FOR COUGH OR PAIN 30 tablet 0  . vitamin C (ASCORBIC ACID) 500 MG tablet Take 500 mg by mouth daily.    . vitamin E 400 UNIT capsule Take 400 Units by mouth daily.    Marland Kitchen acetaminophen (TYLENOL) 325 MG tablet Take 325 mg by mouth every 6 (six) hours as needed.    . Cinnamon 500 MG capsule Take 1,000 mg by mouth daily.    Marland Kitchen dextromethorphan (DELSYM) 30 MG/5ML liquid Take 15 mg by mouth 2 (two) times daily.    . folic acid (FOLVITE) 1 MG tablet TAKE 4 TABLETS(4 MG) BY MOUTH DAILY (Patient not taking: Reported on 03/09/2017) 120 tablet 0  . Turmeric Curcumin 500 MG CAPS Take 1 capsule by mouth daily.     No current facility-administered medications on file prior to visit.     Allergies  Allergen Reactions  . Ace Inhibitors Anaphylaxis and Cough  . Penicillins Hives and Other (See Comments)    Has patient had a PCN reaction causing immediate rash, facial/tongue/throat swelling, SOB or lightheadedness with hypotension:  No Has patient had a PCN reaction causing severe rash involving mucus membranes or skin necrosis:  No Has patient had a PCN reaction that required hospitalization No Has patient had a PCN reaction occurring within  the last 10 years: No If all of the above answers are "NO", then may proceed with Cephalosporin use.  . Adhesive [Tape] Hives    Family History  Problem Relation Age of Onset  . Esophageal cancer Brother   . Esophageal cancer Sister        ?  . Colon polyps Brother   . Pancreatic cancer Sister        ?  . Diabetes Sister   . Diabetes Unknown        Aunt and Uncle  . Heart disease Maternal Grandfather     BP 120/88   Pulse 78   Ht 5' 4.25" (1.632 m)   Wt (!) 322 lb (146.1 kg)   SpO2 96%   BMI 54.84 kg/m    Review of Systems Denies LOC.  She has gained weight.      Objective:   Physical Exam VITAL SIGNS:  See vs page GENERAL: no distress Pulses: foot pulses are intact bilaterally.   MSK: no deformity of the feet or ankles.  CV: 1+ bilat edema of the legs Skin:  no ulcer on the feet or ankles.  normal color and temp on the feet and ankles Neuro: sensation is intact to touch on the feet and ankles.     Lab Results  Component Value Date   CREATININE 1.35 (H) 01/05/2016   BUN 24 (H) 01/05/2016   NA 136 01/05/2016   K 4.7 01/05/2016   CL 99 (L) 01/05/2016   CO2 30 01/05/2016   A1c=8.8%    Assessment & Plan:  Insulin-requiring type 2 DM, with: worse.  Renal insuff: this  is the likely reason why he does not need PM insulin.   Chronic cough: prednisone is affecting glycemic control, but we'll work around that.  Weight gain: this is also affecting glycemic control.   Patient Instructions  Please increase the insulin back up to 120 units each morning.    On this type of insulin schedule, you should eat meals on a regular schedule.  If a meal is missed or significantly delayed, your blood sugar could go low.   Call us if the prednisone is increased again and blood sugar is high, so we can temporarily increase the insulin.  check your blood sugar twice a day.  vary the time of day when you check, between before the 3 meals, and at bedtime.  also check if you have  symptoms of your blood sugar being too high or too low.  please keep a record of the readings and bring it to your next appointment here.  You can write it on any piece of paper.  please call us sooner if your blood sugar goes below 70, or if you have a lot of readings over 200.  Please come back for a follow-up appointment in 2 months.

## 2017-03-09 NOTE — Patient Instructions (Addendum)
Please increase the insulin back up to 120 units each morning.    On this type of insulin schedule, you should eat meals on a regular schedule.  If a meal is missed or significantly delayed, your blood sugar could go low.   Call us if the prednisone is increased again and blood sugar is high, so we can temporarily increase the insulin.  check your blood sugar twice a day.  vary the time of day when you check, between before the 3 meals, and at bedtime.  also check if you have symptoms of your blood sugar being too high or too low.  please keep a record of the readings and bring it to your next appointment here.  You can write it on any piece of paper.  please call us sooner if your blood sugar goes below 70, or if you have a lot of readings over 200.  Please come back for a follow-up appointment in 2 months.

## 2017-03-15 ENCOUNTER — Ambulatory Visit (INDEPENDENT_AMBULATORY_CARE_PROVIDER_SITE_OTHER): Payer: PPO | Admitting: Emergency Medicine

## 2017-03-15 ENCOUNTER — Encounter: Payer: Self-pay | Admitting: Emergency Medicine

## 2017-03-15 DIAGNOSIS — R05 Cough: Secondary | ICD-10-CM | POA: Diagnosis not present

## 2017-03-15 DIAGNOSIS — G4733 Obstructive sleep apnea (adult) (pediatric): Secondary | ICD-10-CM | POA: Diagnosis not present

## 2017-03-15 DIAGNOSIS — R058 Other specified cough: Secondary | ICD-10-CM

## 2017-03-15 MED ORDER — TRAMADOL HCL 50 MG PO TABS: 100.0000 mg | ORAL_TABLET | Freq: Four times a day (QID) | ORAL | 5 refills | Status: DC | PRN

## 2017-03-15 MED ORDER — HYDROCODONE-HOMATROPINE 5-1.5 MG/5ML PO SYRP: 5.0000 mL | ORAL_SOLUTION | Freq: Four times a day (QID) | ORAL | 0 refills | Status: DC | PRN

## 2017-03-15 NOTE — Progress Notes (Signed)
Subjective:    Patient ID: Christy May, female    DOB: 03/18/1951, 66 y.o.   MRN: [id number]  HPI 66 yo woman, former smoker with obesity, OSA, HTN on irbesartan, DM, childhood asthma. Carries a dx of adult asthma although PFT 2015 most consistent w restriction and variable UA irritation on inspiratory loop. She has done speech rx and training for VCD before. Contributing factors are chronic rhinitis, GERD. She has felt that albuterol has helped her in the past.   ROV 11/11/16 - patient has a history of upper airway irritation vocal cord dysfunction, probably some superimposed asthma, obstructive sleep apnea, hypertension, chronic rhinitis and GERD. She was seen one month ago for an exacerbation of her cough. She has used Hycodan to suppress the cough; there has been some question about whether she has over filled prescriptions for her chronic cough syrup in the past.. She is on Protonix twice a day, Pepcid, nasal saline spray, Singulair, Claritin, Atrovent nasal spray. She had been doing quite well for the last month, was exposed to URI and developed cold sx about 2 days ago. She is using hycodan prn. She is wearing her CPAP reliably.   Acute OV 12/15/16 -- Christy May presents today with increased coughing, HA, more head and eye drainage. Throat burning. Has been happening since beginning of June. She was treated with azithromycin 11/25/16.  Current regimen includes nasal saline, ranitidine 300 mg at bedtime, protonix 40 mg twice a day, Singulair 10 mg daily, loratadine 10 mg daily, Neurontin, flonase daily, atrovent NS, chlorpheniramine 4 mg up to every 6 hours, prednisone 20 g daily. She had been taking hycodan with some improvement, ran out 2 days ago.   ROV 03/15/17 -- Follow-up visit for patient with a history of chronic cough, GERD, chronic rhinitis. Also w hx HTN, DM, OSA. She is having difficulty tolerating CPAP > poor mask fit.  Her cough is better, still requires prn cough suppression.  She started tramadol and hycodan about 2 weeks ago, called it in for her. She remains on pred 20mg .   She tells me that she is trying to wear her CPAP, but having a hard time tolerating. She has tried multiple masks, currently trying full face mask. Seem to be impacting her eyes. compliance >> 37% for greater than 4 hours.    Review of Systems As per HPI     Objective:   Physical Exam Vitals:   03/15/17 1536  BP: 124/86  Pulse: 87  SpO2: 95%  Weight: (!) 326 lb (147.9 kg)  Height: 5\' 4"  (1.626 m)    GEN: A/Ox3; depressed affect, obese woman in no distress   HEENT:  OP clear, hoarse voice. No postnasal drip evident  NECK:  No stridor  RESP  Clear bilaterally, no wheeze on forced expiration  CARD:  RRR, no m/r/g, trace edema.   Musco: Warm bil, no deformities or joint swelling noted.   Neuro: alert, no focal deficits noted.    Skin: Warm, no lesions or rashes       Assessment & Plan:  Obstructive sleep apnea Poor compliance, 37% of the time greater than 4 hours. We discussed ways to try and help with this. She has tried multiple masks. Discussed sleep hygiene as well  Upper airway cough syndrome Prednisone dependent. I discussed possibly restarting albuterol with her. She is unclear whether she would benefit. It may be worthwhile retrying at some point in the future. Her pulmonary function testing has never been consistent  with true obstruction.  Please continue your allergy medications and reflux medications as you are taking them  Use hycodan 5cc up to every 12 hours if needed for cough.  We will refill your tramadol today, 100mg  up to every 6 hours if needed for cough, disp #60, refill 6 times Follow with Dr Lamonte Sakai in 3 months or sooner if you have any problems.  Baltazar Apo, MD, PhD 03/15/2017, 4:10 PM [facility] [phone] or if no answer [phone]

## 2017-03-15 NOTE — Addendum Note (Signed)
Addended by: Maryanna Shape A on: 03/15/2017 04:16 PM   Modules accepted: Orders

## 2017-03-15 NOTE — Patient Instructions (Addendum)
Please continue your allergy medications and reflux medications as you are taking them  Use hycodan 5cc up to every 12 hours if needed for cough.  We will refill your tramadol today, 100mg  up to every 6 hours if needed for cough, disp #60, refill 6 times You need to work on wearing your CPAP mask more reliably.  Follow with Dr Lamonte Sakai in 3 months or sooner if you have any problems.

## 2017-03-15 NOTE — Assessment & Plan Note (Signed)
Poor compliance, 37% of the time greater than 4 hours. We discussed ways to try and help with this. She has tried multiple masks. Discussed sleep hygiene as well

## 2017-03-15 NOTE — Assessment & Plan Note (Signed)
Prednisone dependent. I discussed possibly restarting albuterol with her. She is unclear whether she would benefit. It may be worthwhile retrying at some point in the future. Her pulmonary function testing has never been consistent with true obstruction.  Please continue your allergy medications and reflux medications as you are taking them  Use hycodan 5cc up to every 12 hours if needed for cough.  We will refill your tramadol today, 100mg  up to every 6 hours if needed for cough, disp #60, refill 6 times Follow with Dr Lamonte Sakai in 3 months or sooner if you have any problems.

## 2017-03-16 ENCOUNTER — Other Ambulatory Visit: Payer: Self-pay | Admitting: Emergency Medicine

## 2017-03-16 ENCOUNTER — Ambulatory Visit (INDEPENDENT_AMBULATORY_CARE_PROVIDER_SITE_OTHER): Payer: PPO | Admitting: Podiatry

## 2017-03-16 ENCOUNTER — Telehealth: Payer: Self-pay | Admitting: Emergency Medicine

## 2017-03-16 ENCOUNTER — Encounter: Payer: Self-pay | Admitting: Podiatry

## 2017-03-16 DIAGNOSIS — M79676 Pain in unspecified toe(s): Secondary | ICD-10-CM

## 2017-03-16 DIAGNOSIS — Q828 Other specified congenital malformations of skin: Secondary | ICD-10-CM

## 2017-03-16 DIAGNOSIS — R05 Cough: Secondary | ICD-10-CM

## 2017-03-16 DIAGNOSIS — R059 Cough, unspecified: Secondary | ICD-10-CM

## 2017-03-16 DIAGNOSIS — B351 Tinea unguium: Secondary | ICD-10-CM

## 2017-03-16 DIAGNOSIS — R058 Other specified cough: Secondary | ICD-10-CM

## 2017-03-16 NOTE — Telephone Encounter (Signed)
Spoke with [name] with [facility]. [name] states an Rx was sent to [employer] office and faxed backon 02/25/17 for neb machine and medication. Shan states frequency on Rx is missing and Dx code is not acceptable. I have requested that [facility] refax to our office. Will await fax.

## 2017-03-16 NOTE — Progress Notes (Signed)
Patient ID: Christy May, female   DOB: 1950/09/02, 66 y.o.   MRN: [id number] Complaint:  Visit Type: Patient returns to my office for continued preventative foot care services. Complaint: Patient states" my nails have grown long and thick and become painful to walk and wear shoes". The patient presents for preventative foot care services. No changes to ROS.  She also has painful callus under the ball of her left foot.    Podiatric Exam: Vascular: dorsalis pedis and posterior tibial pulses are palpable right foot and diminished left foot.. Capillary return is immediate. Temperature gradient is WNL. Skin turgor WNL  Sensorium: Normal Semmes Weinstein monofilament test. Normal tactile sensation bilaterally. Nail Exam: Pt has thick disfigured discolored nails with subungual debris noted bilateral entire nail hallux through fifth toenails Ulcer Exam: There is no evidence of ulcer or pre-ulcerative changes or infection. Orthopedic Exam: Muscle tone and strength are WNL. No limitations in general ROM. No crepitus or effusions noted. Foot type and digits show no abnormalities. Hallux varus left foot.  Hammer toes 3,4,5 right foot. Skin:  Porokeratosis sub 2 left.. No infection or ulcers  Diagnosis:  Onychomycosis, , Pain in right toe, pain in left toes,  Diabetic neuropathy  Porokeratosis left foot  ROV  Debride nails.   Debride porokeratosis.  RTC 3 weeks.    Gardiner Barefoot DPM

## 2017-03-17 NOTE — Telephone Encounter (Signed)
Form has been received. The diagnosis that was written on the form is unacceptable. A diagnosis has to be chosen from the list of diagnosis codes on the form. The codes listed do not apply to the pt. Form is also missing the frequency in which the pt will use the nebulizer machine. lmtcb x1 for [facility].  **Form has been placed in RB's sign folder**

## 2017-03-17 NOTE — Telephone Encounter (Signed)
[place] with [facility] is returning phone call [phone]

## 2017-03-17 NOTE — Telephone Encounter (Signed)
I have spoken with [name] with [facility], and made her aware that pt does not have one of the diagnosis to be chosen form. [name] states order will need to be canceled, as pt is required to have one of the chosen diagnosis.  Lm for pt to see if she had preference for DME.  Will route to RB to make aware.

## 2017-03-17 NOTE — Telephone Encounter (Signed)
Pt returning call from nurse and can be reached @ [phone].Christy May

## 2017-03-17 NOTE — Telephone Encounter (Signed)
Spoke with pt, asked pt does she have a preference in DME and she stated she doesn't and any one is fine. She asked a the insurance not paying for the mask but I advised her to call [employer] to see what is going on with the billing. Awaiting RB response.

## 2017-03-18 NOTE — Telephone Encounter (Signed)
Not sure what the question for me is. OK with me to proceed with an alternative DME company to get her device.

## 2017-03-18 NOTE — Telephone Encounter (Signed)
I'm unsure of what is needed in this phone message from our office.Marland Kitchen lmtcb X1 for pt to help obtain clarification.

## 2017-03-19 ENCOUNTER — Telehealth: Payer: Self-pay | Admitting: Emergency Medicine

## 2017-03-19 NOTE — Telephone Encounter (Signed)
Order has been placed to [facility] to get the pt set up with a nebulizer machine.

## 2017-03-19 NOTE — Telephone Encounter (Signed)
Called and spoke to [place] at [facility] and they have the order I called [phone]

## 2017-03-19 NOTE — Telephone Encounter (Signed)
Spoke with CSR and she stated she needed an order faxed. I advised the fax was sent but we can fax it again to make sure the order is sent in and pt receives nebulizer. PCC's can we fax the order again. Thank you.  Phone: [phone]

## 2017-03-22 DIAGNOSIS — R05 Cough: Secondary | ICD-10-CM | POA: Diagnosis not present

## 2017-03-23 ENCOUNTER — Ambulatory Visit: Payer: Self-pay | Admitting: Pharmacist

## 2017-03-26 ENCOUNTER — Other Ambulatory Visit: Payer: Self-pay | Admitting: Pharmacist

## 2017-03-26 NOTE — Patient Outreach (Signed)
[facility]) Care Management  03/26/2017  SHEWANDA SHARPE 1950-12-18 [id number]  Called patient to follow up with her on being referred to [facility]. Unfortunately, she did not answer the phone. HIPAA compliant message left on the patient's voicemail.  Plan:  Call patient back in 5-7 business days.   Elayne Guerin, PharmD, [place] Clinical Pharmacist [phone]

## 2017-03-29 ENCOUNTER — Other Ambulatory Visit: Payer: Self-pay | Admitting: Pharmacist

## 2017-03-30 NOTE — Patient Outreach (Signed)
[facility]) Care Management  03/30/2017  Christy May 09-Sep-1950 [id number]  Patient called me back. HIPAA identifiers were obtained. Home visit scheduled for Thursday April 01, 2017.   Elayne Guerin, PharmD, [place] Clinical Pharmacist [phone]

## 2017-04-01 ENCOUNTER — Ambulatory Visit: Payer: Self-pay | Admitting: Pharmacist

## 2017-04-05 ENCOUNTER — Ambulatory Visit: Payer: Self-pay | Admitting: Pharmacist

## 2017-04-06 ENCOUNTER — Other Ambulatory Visit: Payer: Self-pay | Admitting: Pharmacist

## 2017-04-06 NOTE — Patient Outreach (Signed)
[facility]) Care Management  [place]   04/06/2017  Christy May Nov 11, 1950 [id number]  Subjective: Home visit completed at patient's home. HIPAA identifiers were obtained. Patient is a 66 year old female with multiple medical conditions including but not limited to: Hypertension, OSA, constipation, GERD, diabetes, DJD, depression, morbid obesity and type 2 diabetes.  Purpose of today's visit was to review Medicare options for next year.    Objective:   Encounter Medications: Outpatient Encounter Prescriptions as of 04/06/2017  Medication Sig Note  . acetaminophen (TYLENOL) 325 MG tablet Take 325 mg by mouth every 6 (six) hours as needed. 02/10/2017: OTC  . albuterol (PROVENTIL) (2.5 MG/3ML) 0.083% nebulizer solution Take 2.5 mg by nebulization every 6 (six) hours as needed for wheezing or shortness of breath.   . allopurinol (ZYLOPRIM) 300 MG tablet Take 300 mg by mouth daily.   Marland Kitchen aspirin EC 81 MG tablet Take 81 mg by mouth at bedtime.   . chlorpheniramine (CHLORPHEN) 4 MG tablet Take 4 mg by mouth daily.   . cholecalciferol (VITAMIN D) 1000 UNITS tablet Take 2,000 Units by mouth daily.    . Cinnamon 500 MG capsule Take 1,000 mg by mouth daily.   . Cyanocobalamin (VITAMIN B 12 PO) Take 500 mcg by mouth daily.   . cycloSPORINE (RESTASIS) 0.05 % ophthalmic emulsion 1 drop 2 (two) times daily.   . fluconazole (DIFLUCAN) 150 MG tablet Take 150 mg by mouth daily. 02/10/2017: Taking PRN  . fluticasone (FLONASE) 50 MCG/ACT nasal spray SHAKE LIQUID AND USE 2 SPRAYS IN EACH NOSTRIL DAILY   . fluticasone (VERAMYST) 27.5 MCG/SPRAY nasal spray Place 2 sprays into the nose daily.   Marland Kitchen gabapentin (NEURONTIN) 800 MG tablet Take 1 tablet (800 mg total) by mouth 3 (three) times daily.   Marland Kitchen HYDROcodone-homatropine (HYCODAN) 5-1.5 MG/5ML syrup Take 5 mLs by mouth every 6 (six) hours as needed for cough.   . insulin NPH Human (NOVOLIN N RELION) 100 UNIT/ML injection Inject  1.2 mLs (120 Units total) into the skin every morning.   . Insulin Syringe-Needle U-100 (INSULIN SYRINGE 1CC/31GX5/16") 31G X 5/16" 1 ML MISC USE TO INJECT INSULIN TWICE DAILY   . ipratropium (ATROVENT) 0.03 % nasal spray USE 2 SPRAYS IN EACH NOSTRIL THREE TIMES DAILY AS NEEDED FOR RHINITIS   . irbesartan (AVAPRO) 300 MG tablet Take 300 mg by mouth daily.   Marland Kitchen loratadine (CLARITIN) 10 MG tablet Take 10 mg by mouth daily.   . metoprolol (LOPRESSOR) 50 MG tablet Take 75 mg by mouth 2 (two) times daily.   . montelukast (SINGULAIR) 10 MG tablet TAKE 1 TABLET(10 MG) BY MOUTH DAILY   . nystatin cream (MYCOSTATIN) Apply 1 application topically 2 (two) times daily as needed for dry skin.   . ONE TOUCH ULTRA TEST test strip TEST BLOOD SUGAR TWICE DAILY   . oxybutynin (DITROPAN-XL) 10 MG 24 hr tablet Take 1 tablet (10 mg total) by mouth daily.   . pantoprazole (PROTONIX) 40 MG tablet TAKE 1 TABLET(40 MG) BY MOUTH TWICE DAILY   . predniSONE (DELTASONE) 20 MG tablet Take 1 tablet (20 mg total) by mouth daily with breakfast.   . ranitidine (ZANTAC) 300 MG tablet Take 300 mg by mouth at bedtime.   . sodium chloride (OCEAN) 0.65 % SOLN nasal spray Place 2 sprays into both nostrils 2 (two) times daily.   . traMADol (ULTRAM) 50 MG tablet Take 2 tablets (100 mg total) by mouth every 6 (six) hours  as needed. TAKE 1-2 TABLETS EVERY 4 HOURS AS NEED FOR COUGH OR PAIN   . Turmeric Curcumin 500 MG CAPS Take 1 capsule by mouth daily.   . vitamin C (ASCORBIC ACID) 500 MG tablet Take 500 mg by mouth daily.   . vitamin E 400 UNIT capsule Take 400 Units by mouth daily.   . folic acid (FOLVITE) 1 MG tablet TAKE 4 TABLETS(4 MG) BY MOUTH DAILY (Patient not taking: Reported on 04/06/2017) 04/06/2017: Out of   No facility-administered encounter medications on file as of 04/06/2017.     Functional Status: In your present state of health, do you have any difficulty performing the following activities: 03/02/2017 11/24/2016   Hearing? N N  Vision? N N  Difficulty concentrating or making decisions? N N  Walking or climbing stairs? Y Y  Dressing or bathing? Y Y  Doing errands, shopping? N N  Preparing Food and eating ? N N  Using the Toilet? Y Y  In the past six months, have you accidently leaked urine? - Y  Do you have problems with loss of bowel control? Y Y  Managing your Medications? N N  Managing your Finances? N N  Housekeeping or managing your Housekeeping? Y Y  Some recent data might be hidden    Fall/Depression Screening: Fall Risk  03/02/2017 11/24/2016 11/11/2016  Falls in the past year? Yes Yes Yes  Number falls in past yr: 2 or more 2 or more 2 or more  Injury with Fall? No No No  Risk Factor Category  High Fall Risk High Fall Risk High Fall Risk  Comment - - -  Risk for fall due to : History of fall(s);Impaired balance/gait;Impaired mobility;Medication side effect;Impaired vision History of fall(s);Impaired balance/gait;Impaired mobility;Medication side effect;Impaired vision History of fall(s);Impaired balance/gait;Impaired mobility;Medication side effect;Impaired vision  Follow up Falls evaluation completed;Education provided;Falls prevention discussed Education provided;Falls prevention discussed Falls evaluation completed;Education provided   [facility] 2/9 Scores 03/02/2017 11/24/2016 11/11/2016 01/25/2015 04/19/2014 04/19/2014  PHQ - 2 Score 0 0 0 0 0 1    Assessment: Patient's medications were reviewed during the home visit. Patient uses a four-slot weekly pill box.    Neurologic/Psychologic: Gabapentin  Cardiovascular: Aspirin Irbesartan Metoprolol tartrate  Pulmonary/Allergy: Albuterol nebulizer solution Chlorpheniramine Fluticasone Nasal Montelukast  Hydrocodone/Homatropine Syrup (Hyromet, Hycodan) Ipratropium Nasal Spray Loratadine Saline Solution (nasal) Prednisone 20mg  (daily/maintenance)  Gastrointestinal: Pantoprazole Ranitidine  Endocrine: Humulin  N   Renal: Allopurinol--did not have said she was going to pick this up from the pharmacy  Topical: Nystatin cream-PRN  Pain: Tramadol Acetaminophen  Vitamins/Minerals: Ascorbic Acid Cinnamon Cyanocobalamin Vitamin E Cholecalciferol (vitamin D) Folic Acid-patient did not have--said she was going to pick up Turmeric  Eye Cyclosporine eye drops  Urinary Oxybutynin XL  Misc: Fluconazole    Medicare Plan Option Findings:  Patient's enrollment options were reviewed by entering her information into the medicare.gov website.  [facility] was also called on the patient's behalf. A message was left for the representative, [name].  Plan: Conference call with [facility] representative and patient later this week.  Patient will also have a Nurse Practitioner Visit by [employer] this Friday.  Route note to Dr. Criss Rosales.   Christy May, PharmD, [place] Clinical Pharmacist [phone]

## 2017-04-07 ENCOUNTER — Other Ambulatory Visit: Payer: Self-pay | Admitting: Pharmacist

## 2017-04-07 NOTE — Patient Outreach (Signed)
[place] [facility]) Care Management  04/07/2017  Christy May 01/20/1951 [id number]   Called patient to follow up from our home visit yesterday.  HIPAA identifiers were obtained.  [facility] was called again on the patient's behalf as we were supposed to all be on a conference call today. Unfortunately, the [facility] representative did not call at 11:00am as was promised yesterday and when I called him at 3pm, he was already gone for the day. Another message was left for the representative.  Patient was made aware of the status of things.  Plan: Await a call back from the [facility] representative [name]) Call patient and Mr. [name] back on Monday April 14, 2017.    Elayne Guerin, PharmD, [place] Clinical Pharmacist [phone]

## 2017-04-12 ENCOUNTER — Other Ambulatory Visit: Payer: Self-pay | Admitting: Pharmacist

## 2017-04-12 NOTE — Patient Outreach (Addendum)
[place] [facility]) Care Management  04/12/2017  Christy May March 25, 1951 [id number]   Patient was called to follow up on Medicare choices for 2019.  Unfortunately, patient did not answer the phone. HIPAA compliant message was left on her voicemail.  [facility] was called again on the patient's behalf. A message was left for Christy May.  Plan:  Follow up in 1-2 business days.   Elayne Guerin, PharmD, BCACP [facility] Clinical Pharmacist [phone]  [name] from [facility] called me back and gathered some demographic information about the patient.  He said he will have [name] call me ASAP to schedule a phone assessment with the patient.  Elayne Guerin, PharmD, [place] Clinical Pharmacist [phone]

## 2017-04-13 ENCOUNTER — Other Ambulatory Visit: Payer: Self-pay | Admitting: Endocrinology

## 2017-04-14 ENCOUNTER — Other Ambulatory Visit: Payer: Self-pay | Admitting: Pharmacist

## 2017-04-14 NOTE — Patient Outreach (Signed)
[place] [facility]) Care Management  04/14/2017  Christy May 08-12-1950 [id number]   Called patient and [facility] representative, [name]. HIPAA identifiers were obtained from the patient. Message was left for [name] with [facility]. Desperately trying to link patient with [facility] as she has questions about in home care coverage.  Patient's contact information was left with Mr. [name].  Plan:  Call patient back in 5-7 business days.   Elayne Guerin, PharmD, [place] Clinical Pharmacist [phone]

## 2017-04-23 ENCOUNTER — Other Ambulatory Visit: Payer: Self-pay | Admitting: Pharmacist

## 2017-04-23 NOTE — Patient Outreach (Signed)
[facility]) Care Management  04/23/2017  OTHEL DICOSTANZO March 14, 1951 [id number]  Called patient to follow up on her talking to [facility]. HIPAA identifiers were obtained. Patient said she spoke with the [facility] representative, [name].  She decided to go back to straight Medicare A &B and [employer] for her Part D plan.  Patient was not completely sure what Part D plan she signed up for.   Plan:  I will follow up with the patient in 1 month to check on her enrollment.  Elayne Guerin, PharmD, [place] Clinical Pharmacist [phone]

## 2017-04-26 ENCOUNTER — Telehealth: Payer: Self-pay | Admitting: *Deleted

## 2017-04-26 NOTE — Telephone Encounter (Signed)
I called patient & she stated that she doesn't feel well when she wakes up with blood sugars being so low. Here are her CBG's for past week.  10/29 84 10/30 93 10/31 84 11/1 97  11/2 154 11/3 85 after breakfast of pancakes she took 20 units bc blood sugar was 310 after 20 units was 205 evening was 72 11/4 88 11/5 249 after eating soup, cottage cheese with peaches  Also was wanting to know if it was ok that she has been brewing Chick[place] which is natural insulin that is brewed like coffee? She stated that blood sugars have been better since drinking it.

## 2017-04-26 NOTE — Telephone Encounter (Signed)
Please decrease the insulin back up to 110 units each morning.

## 2017-04-26 NOTE — Telephone Encounter (Signed)
I called & left detailed VM to patient to decrease insulin to 110 units. I will call back in the morning to try to reach her to insure she got message.

## 2017-04-26 NOTE — Telephone Encounter (Signed)
Patient called and wants to talk to the provider or the nurse about her blood sugar going up and down. Patient wants to talk about that. She also want to talk about a "natural insulin " that her daughter told her about. She is requesting a call back. Her call back number is [phone]. Please advise. Thank you

## 2017-04-27 NOTE — Telephone Encounter (Signed)
I tried to call patient again & left another message stating new dosage. I also asked for a cal back so I could insure that she understood.

## 2017-04-28 ENCOUNTER — Telehealth: Payer: Self-pay | Admitting: Endocrinology

## 2017-04-28 NOTE — Telephone Encounter (Signed)
Patient returned your call. Please call patient at ph#  [phone]

## 2017-04-29 NOTE — Telephone Encounter (Signed)
Called patient & she stated that she had gotten my message about insulin dosage change. She stated that she would call back if her blood sugars were going to high or low.

## 2017-05-04 ENCOUNTER — Other Ambulatory Visit: Payer: Self-pay | Admitting: Gastroenterology

## 2017-05-04 DIAGNOSIS — M25569 Pain in unspecified knee: Secondary | ICD-10-CM | POA: Diagnosis not present

## 2017-05-04 DIAGNOSIS — M940 Chondrocostal junction syndrome [Tietze]: Secondary | ICD-10-CM | POA: Diagnosis not present

## 2017-05-05 DIAGNOSIS — M25561 Pain in right knee: Secondary | ICD-10-CM | POA: Diagnosis not present

## 2017-05-06 ENCOUNTER — Telehealth: Payer: Self-pay | Admitting: Endocrinology

## 2017-05-06 ENCOUNTER — Other Ambulatory Visit: Payer: Self-pay

## 2017-05-06 MED ORDER — GLUCOSE BLOOD VI STRP: ORAL_STRIP | 12 refills | Status: DC

## 2017-05-06 NOTE — Telephone Encounter (Signed)
Notified patient strips were sent in.

## 2017-05-06 NOTE — Telephone Encounter (Signed)
New Message  Pt verbalized she is needing new test strips for prodigy device sent in to pharmacy.  Please f/u

## 2017-05-10 ENCOUNTER — Other Ambulatory Visit: Payer: Self-pay | Admitting: Endocrinology

## 2017-05-11 ENCOUNTER — Encounter: Payer: Self-pay | Admitting: Endocrinology

## 2017-05-11 ENCOUNTER — Ambulatory Visit: Payer: PPO | Admitting: Endocrinology

## 2017-05-11 VITALS — BP 126/84 | HR 83 | Wt 331.4 lb

## 2017-05-11 DIAGNOSIS — Z794 Long term (current) use of insulin: Secondary | ICD-10-CM

## 2017-05-11 DIAGNOSIS — N183 Chronic kidney disease, stage 3 unspecified: Secondary | ICD-10-CM

## 2017-05-11 DIAGNOSIS — E1122 Type 2 diabetes mellitus with diabetic chronic kidney disease: Secondary | ICD-10-CM | POA: Diagnosis not present

## 2017-05-11 LAB — POCT GLYCOSYLATED HEMOGLOBIN (HGB A1C): HEMOGLOBIN A1C: 8.1

## 2017-05-11 NOTE — Progress Notes (Signed)
Subjective:    Patient ID: Christy May, female    DOB: 1950/08/23, 66 y.o.   MRN: [id number]  HPI Pt returns for f/u of diabetes mellitus:  DM type: Insulin-requiring type 2 Dx'ed: 4332 Complications: polyneuropathy and renal insufficiency.  Therapy: insulin since 2002.  GDM: never.  DKA: never.  Severe hypoglycemia: never.  Pancreatitis: never.  Other: she was changed to a qd insulin regimen, after poor results on multiple daily injections; she wants the cheapest possible insulin.  She declines weight loss surgery.   Interval history: no cbg record, but states cbg's vary from 62-200's.  There is no trend throughout the day.  she says she never misses the insulin.  She still on prednisone now 20 mg qd.  She now takes 120 units qam.  She got last steroid injection last week, into the right knee.  Past Medical History:  Diagnosis Date  . Allergic bronchitis   . Allergy   . Anemia, unspecified   . Anxiety   . Arthritis    "neck, back, hands" (09/03/2014)  . Chronic airway obstruction, not elsewhere classified    "I was told I don't have this/tests done @ [facility] 05/2014"  . Chronic back pain   . Colon polyps   . Complication of anesthesia    "I've had recall; I probably stopped breathing at least 2 times" (09/03/2014)  . Dehydration   . Depressive disorder, not elsewhere classified   . Diabetic peripheral neuropathy ([place])   . Dysphagia 2007   historyof dysphagia with severe dysmotility by barium swallow-[name]  . Esophageal reflux   . Extrinsic asthma, unspecified    "I was told I don't have this/tests done @ [facility] 05/2014"  . Family history of adverse reaction to anesthesia    "my brother was told they lost him a couple times during OR"  . Headache    "weekly to monthly" (09/03/2014)  . Heart murmur   . History of hiatal hernia   . Hypertension   . Inflammatory and toxic neuropathy, unspecified   . Migraine    "couple times/month right now" (09/03/2014)  .  Obesity, unspecified   . OSA (obstructive sleep apnea)     CPAP  . Spondylosis of unspecified site without mention of myelopathy   . Type II diabetes mellitus ([place])   . Unspecified essential hypertension   . Unspecified menopausal and postmenopausal disorder   . Unspecified vitamin D deficiency     Past Surgical History:  Procedure Laterality Date  . ABDOMINAL HYSTERECTOMY     "partial"  . ANTERIOR CERVICAL DECOMP/DISCECTOMY FUSION     multiple cervical spine levels;Dr. Arnoldo Morale  . APPENDECTOMY    . BACK SURGERY    . CHOLECYSTECTOMY OPEN    . COLONOSCOPY N/A 05/08/2014   Procedure: COLONOSCOPY;  Surgeon: [name], MD;  Location: [facility] ENDOSCOPY;  Service: Endoscopy;  Laterality: N/A;  . DILATION AND CURETTAGE OF UTERUS    . ESOPHAGOGASTRODUODENOSCOPY N/A 05/08/2014   Procedure: ESOPHAGOGASTRODUODENOSCOPY (EGD);  Surgeon: [name], MD;  Location: [name];  Service: Endoscopy;  Laterality: N/A;  . EXPLORATORY LAPAROTOMY    . EYE SURGERY    . FOOT SURGERY Right X 2   "took blood out of my arms and put platelets in my feet"  . INCISION AND DRAINAGE ABSCESS     Chest  . JOINT REPLACEMENT    . REFRACTIVE SURGERY Bilateral   . TONSILLECTOMY    . TOTAL KNEE ARTHROPLASTY Left 10/2003  .  TOTAL KNEE ARTHROPLASTY Right 01/2004   [name]    Social History   Socioeconomic History  . Marital status: Widowed    Spouse name: Not on file  . Number of children: 1  . Years of education: Not on file  . Highest education level: Not on file  Social Needs  . Financial resource strain: Not on file  . Food insecurity - worry: Not on file  . Food insecurity - inability: Not on file  . Transportation needs - medical: Not on file  . Transportation needs - non-medical: Not on file  Occupational History  . Occupation: disabled    Employer: DISABLED  Tobacco Use  . Smoking status: Former Smoker    Packs/day: 1.00    Years: 20.00    Pack years: 20.00    Types: Cigarettes     Last attempt to quit: 06/22/1986    Years since quitting: 30.9  . Smokeless tobacco: Never Used  Substance and Sexual Activity  . Alcohol use: Yes  . Drug use: No  . Sexual activity: Not on file  Other Topics Concern  . Not on file  Social History Narrative   ** Merged History Encounter **       Patient does not get regular exercise.      Widowed 2004      Disabled    Current Outpatient Medications on File Prior to Visit  Medication Sig Dispense Refill  . albuterol (PROVENTIL) (2.5 MG/3ML) 0.083% nebulizer solution Take 2.5 mg by nebulization every 6 (six) hours as needed for wheezing or shortness of breath.    . allopurinol (ZYLOPRIM) 300 MG tablet Take 300 mg by mouth daily.    Marland Kitchen aspirin EC 81 MG tablet Take 81 mg by mouth at bedtime.    . chlorpheniramine (CHLORPHEN) 4 MG tablet Take 4 mg by mouth daily.    . cholecalciferol (VITAMIN D) 1000 UNITS tablet Take 2,000 Units by mouth daily.     . Cinnamon 500 MG capsule Take 1,000 mg by mouth daily.    . Cyanocobalamin (VITAMIN B 12 PO) Take 500 mcg by mouth daily.    . cycloSPORINE (RESTASIS) 0.05 % ophthalmic emulsion 1 drop 2 (two) times daily.    . fluconazole (DIFLUCAN) 150 MG tablet Take 150 mg by mouth daily.    . fluticasone (FLONASE) 50 MCG/ACT nasal spray SHAKE LIQUID AND USE 2 SPRAYS IN EACH NOSTRIL DAILY 16 g 5  . folic acid (FOLVITE) 1 MG tablet TAKE 4 TABLETS(4 MG) BY MOUTH DAILY 120 tablet 0  . gabapentin (NEURONTIN) 800 MG tablet Take 1 tablet (800 mg total) by mouth 3 (three) times daily. 270 tablet 1  . glucose blood test strip Used to check blood sugar 2x daily. 100 each 12  . insulin NPH Human (NOVOLIN N RELION) 100 UNIT/ML injection Inject 1.2 mLs (120 Units total) into the skin every morning. 40 mL 11  . ipratropium (ATROVENT) 0.03 % nasal spray USE 2 SPRAYS IN EACH NOSTRIL THREE TIMES DAILY AS NEEDED FOR RHINITIS 30 mL 5  . irbesartan (AVAPRO) 300 MG tablet Take 300 mg by mouth daily.    Marland Kitchen loratadine  (CLARITIN) 10 MG tablet Take 10 mg by mouth daily.    . metoprolol (LOPRESSOR) 50 MG tablet Take 75 mg by mouth 2 (two) times daily.    . montelukast (SINGULAIR) 10 MG tablet TAKE 1 TABLET(10 MG) BY MOUTH DAILY 90 tablet 1  . NOVOLIN N RELION 100 UNIT/ML injection INJECT 190  UNITS SUBCUTANEOUSLY EVERY MORNING 70 mL 11  . nystatin cream (MYCOSTATIN) Apply 1 application topically 2 (two) times daily as needed for dry skin.    Marland Kitchen oxybutynin (DITROPAN-XL) 10 MG 24 hr tablet Take 1 tablet (10 mg total) by mouth daily. 90 tablet 1  . pantoprazole (PROTONIX) 40 MG tablet TAKE 1 TABLET(40 MG) BY MOUTH TWICE DAILY 60 tablet 2  . predniSONE (DELTASONE) 20 MG tablet Take 1 tablet (20 mg total) by mouth daily with breakfast. 30 tablet 5  . ranitidine (ZANTAC) 300 MG tablet Take 300 mg by mouth at bedtime.    . sodium chloride (OCEAN) 0.65 % SOLN nasal spray Place 2 sprays into both nostrils 2 (two) times daily.    . traMADol (ULTRAM) 50 MG tablet Take 2 tablets (100 mg total) by mouth every 6 (six) hours as needed. TAKE 1-2 TABLETS EVERY 4 HOURS AS NEED FOR COUGH OR PAIN 60 tablet 5  . Turmeric Curcumin 500 MG CAPS Take 1 capsule by mouth daily.    . Insulin Syringe-Needle U-100 (INSULIN SYRINGE 1CC/31GX5/16") 31G X 5/16" 1 ML MISC USE TO INJECT INSULIN TWICE DAILY 100 each 0   No current facility-administered medications on file prior to visit.     Allergies  Allergen Reactions  . Ace Inhibitors Anaphylaxis and Cough  . Penicillins Hives and Other (See Comments)    Has patient had a PCN reaction causing immediate rash, facial/tongue/throat swelling, SOB or lightheadedness with hypotension:  No Has patient had a PCN reaction causing severe rash involving mucus membranes or skin necrosis:  No Has patient had a PCN reaction that required hospitalization No Has patient had a PCN reaction occurring within the last 10 years: No If all of the above answers are "NO", then may proceed with Cephalosporin use.  .  Adhesive [Tape] Hives    Family History  Problem Relation Age of Onset  . Esophageal cancer Brother   . Esophageal cancer Sister        ?  . Colon polyps Brother   . Pancreatic cancer Sister        ?  . Diabetes Sister   . Diabetes Unknown        Aunt and Uncle  . Heart disease Maternal Grandfather     BP 126/84 (BP Location: Right Arm, Patient Position: Sitting, Cuff Size: Large)   Pulse 83   Wt (!) 331 lb 6.4 oz (150.3 kg)   SpO2 96%   BMI 56.88 kg/m    Review of Systems Denies LOC    Objective:   Physical Exam VITAL SIGNS:  See vs page GENERAL: no distress Pulses: foot pulses are intact bilaterally.   MSK: no deformity of the feet or ankles.  CV: 1+ bilat edema of the legs Skin:  no ulcer on the feet or ankles.  normal color and temp on the feet and ankles Neuro: sensation is intact to touch on the feet and ankles.    Lab Results  Component Value Date   HGBA1C 8.1 05/11/2017      Assessment & Plan:  Insulin-requiring type 2 DM: she needs increased rx Hypoglycemia: we can't increase insulin, at least for now. Knee pain: steroid injections are affecting a1c.  Patient Instructions  Please continue the same insulin.  On this type of insulin schedule, you should eat meals on a regular schedule.  If a meal is missed or significantly delayed, your blood sugar could go low.   Call us if the prednisone is  increased again and blood sugar is high, so we can temporarily increase the insulin.  check your blood sugar twice a day.  vary the time of day when you check, between before the 3 meals, and at bedtime.  also check if you have symptoms of your blood sugar being too high or too low.  please keep a record of the readings and bring it to your next appointment here.  You can write it on any piece of paper.  please call us sooner if your blood sugar goes below 70, or if you have a lot of readings over 200.   Please come back for a follow-up appointment in 3 months.

## 2017-05-11 NOTE — Patient Instructions (Addendum)
Please continue the same insulin.  On this type of insulin schedule, you should eat meals on a regular schedule.  If a meal is missed or significantly delayed, your blood sugar could go low.   Call us if the prednisone is increased again and blood sugar is high, so we can temporarily increase the insulin.  check your blood sugar twice a day.  vary the time of day when you check, between before the 3 meals, and at bedtime.  also check if you have symptoms of your blood sugar being too high or too low.  please keep a record of the readings and bring it to your next appointment here.  You can write it on any piece of paper.  please call us sooner if your blood sugar goes below 70, or if you have a lot of readings over 200.   Please come back for a follow-up appointment in 3 months.

## 2017-05-17 DIAGNOSIS — Z794 Long term (current) use of insulin: Secondary | ICD-10-CM | POA: Diagnosis not present

## 2017-05-17 DIAGNOSIS — Z880 Allergy status to penicillin: Secondary | ICD-10-CM | POA: Diagnosis not present

## 2017-05-17 DIAGNOSIS — I10 Essential (primary) hypertension: Secondary | ICD-10-CM | POA: Diagnosis not present

## 2017-05-17 DIAGNOSIS — E119 Type 2 diabetes mellitus without complications: Secondary | ICD-10-CM | POA: Diagnosis not present

## 2017-05-17 DIAGNOSIS — B029 Zoster without complications: Secondary | ICD-10-CM | POA: Diagnosis not present

## 2017-05-17 DIAGNOSIS — K219 Gastro-esophageal reflux disease without esophagitis: Secondary | ICD-10-CM | POA: Diagnosis not present

## 2017-05-17 DIAGNOSIS — M25561 Pain in right knee: Secondary | ICD-10-CM | POA: Diagnosis not present

## 2017-05-17 DIAGNOSIS — R079 Chest pain, unspecified: Secondary | ICD-10-CM | POA: Diagnosis not present

## 2017-05-17 DIAGNOSIS — R600 Localized edema: Secondary | ICD-10-CM | POA: Diagnosis not present

## 2017-05-17 DIAGNOSIS — R0600 Dyspnea, unspecified: Secondary | ICD-10-CM | POA: Diagnosis not present

## 2017-05-17 DIAGNOSIS — R0789 Other chest pain: Secondary | ICD-10-CM | POA: Diagnosis not present

## 2017-05-17 DIAGNOSIS — Z6841 Body Mass Index (BMI) 40.0 and over, adult: Secondary | ICD-10-CM | POA: Diagnosis not present

## 2017-05-17 DIAGNOSIS — Z96653 Presence of artificial knee joint, bilateral: Secondary | ICD-10-CM | POA: Diagnosis not present

## 2017-05-17 DIAGNOSIS — I161 Hypertensive emergency: Secondary | ICD-10-CM | POA: Diagnosis not present

## 2017-05-17 DIAGNOSIS — M109 Gout, unspecified: Secondary | ICD-10-CM | POA: Diagnosis not present

## 2017-05-17 DIAGNOSIS — R0602 Shortness of breath: Secondary | ICD-10-CM | POA: Diagnosis not present

## 2017-05-18 DIAGNOSIS — R609 Edema, unspecified: Secondary | ICD-10-CM | POA: Diagnosis not present

## 2017-05-18 DIAGNOSIS — I517 Cardiomegaly: Secondary | ICD-10-CM | POA: Diagnosis not present

## 2017-05-18 DIAGNOSIS — R079 Chest pain, unspecified: Secondary | ICD-10-CM | POA: Diagnosis not present

## 2017-05-18 DIAGNOSIS — I340 Nonrheumatic mitral (valve) insufficiency: Secondary | ICD-10-CM | POA: Diagnosis not present

## 2017-05-18 DIAGNOSIS — M25561 Pain in right knee: Secondary | ICD-10-CM | POA: Diagnosis not present

## 2017-05-19 DIAGNOSIS — I10 Essential (primary) hypertension: Secondary | ICD-10-CM | POA: Diagnosis not present

## 2017-05-19 DIAGNOSIS — M25561 Pain in right knee: Secondary | ICD-10-CM | POA: Diagnosis not present

## 2017-05-19 DIAGNOSIS — Z6841 Body Mass Index (BMI) 40.0 and over, adult: Secondary | ICD-10-CM | POA: Diagnosis not present

## 2017-05-19 DIAGNOSIS — Z96653 Presence of artificial knee joint, bilateral: Secondary | ICD-10-CM | POA: Diagnosis not present

## 2017-05-19 DIAGNOSIS — R072 Precordial pain: Secondary | ICD-10-CM | POA: Diagnosis not present

## 2017-05-19 DIAGNOSIS — E119 Type 2 diabetes mellitus without complications: Secondary | ICD-10-CM | POA: Diagnosis not present

## 2017-05-20 DIAGNOSIS — M25561 Pain in right knee: Secondary | ICD-10-CM | POA: Diagnosis not present

## 2017-05-20 DIAGNOSIS — R0789 Other chest pain: Secondary | ICD-10-CM | POA: Diagnosis not present

## 2017-05-20 DIAGNOSIS — I10 Essential (primary) hypertension: Secondary | ICD-10-CM | POA: Diagnosis not present

## 2017-05-20 DIAGNOSIS — E119 Type 2 diabetes mellitus without complications: Secondary | ICD-10-CM | POA: Diagnosis not present

## 2017-05-20 DIAGNOSIS — Z6841 Body Mass Index (BMI) 40.0 and over, adult: Secondary | ICD-10-CM | POA: Diagnosis not present

## 2017-05-20 DIAGNOSIS — Z96653 Presence of artificial knee joint, bilateral: Secondary | ICD-10-CM | POA: Diagnosis not present

## 2017-05-21 DIAGNOSIS — I10 Essential (primary) hypertension: Secondary | ICD-10-CM | POA: Diagnosis not present

## 2017-05-21 DIAGNOSIS — M25561 Pain in right knee: Secondary | ICD-10-CM | POA: Diagnosis not present

## 2017-05-21 DIAGNOSIS — Z96653 Presence of artificial knee joint, bilateral: Secondary | ICD-10-CM | POA: Diagnosis not present

## 2017-05-21 DIAGNOSIS — Z6841 Body Mass Index (BMI) 40.0 and over, adult: Secondary | ICD-10-CM | POA: Diagnosis not present

## 2017-05-21 DIAGNOSIS — R0789 Other chest pain: Secondary | ICD-10-CM | POA: Diagnosis not present

## 2017-05-21 DIAGNOSIS — E119 Type 2 diabetes mellitus without complications: Secondary | ICD-10-CM | POA: Diagnosis not present

## 2017-05-21 DIAGNOSIS — R2243 Localized swelling, mass and lump, lower limb, bilateral: Secondary | ICD-10-CM | POA: Diagnosis not present

## 2017-05-22 DIAGNOSIS — I10 Essential (primary) hypertension: Secondary | ICD-10-CM | POA: Diagnosis not present

## 2017-05-22 DIAGNOSIS — E119 Type 2 diabetes mellitus without complications: Secondary | ICD-10-CM | POA: Diagnosis not present

## 2017-05-22 DIAGNOSIS — R5381 Other malaise: Secondary | ICD-10-CM | POA: Diagnosis not present

## 2017-05-22 DIAGNOSIS — Z96653 Presence of artificial knee joint, bilateral: Secondary | ICD-10-CM | POA: Diagnosis not present

## 2017-05-22 DIAGNOSIS — R0789 Other chest pain: Secondary | ICD-10-CM | POA: Diagnosis not present

## 2017-05-22 DIAGNOSIS — R079 Chest pain, unspecified: Secondary | ICD-10-CM | POA: Diagnosis not present

## 2017-05-24 DIAGNOSIS — R05 Cough: Secondary | ICD-10-CM | POA: Diagnosis not present

## 2017-05-24 DIAGNOSIS — M6281 Muscle weakness (generalized): Secondary | ICD-10-CM | POA: Diagnosis not present

## 2017-05-24 DIAGNOSIS — R0602 Shortness of breath: Secondary | ICD-10-CM | POA: Diagnosis not present

## 2017-05-24 DIAGNOSIS — N390 Urinary tract infection, site not specified: Secondary | ICD-10-CM | POA: Diagnosis not present

## 2017-05-24 DIAGNOSIS — G4733 Obstructive sleep apnea (adult) (pediatric): Secondary | ICD-10-CM | POA: Diagnosis not present

## 2017-05-24 DIAGNOSIS — R269 Unspecified abnormalities of gait and mobility: Secondary | ICD-10-CM | POA: Diagnosis not present

## 2017-05-24 DIAGNOSIS — E1140 Type 2 diabetes mellitus with diabetic neuropathy, unspecified: Secondary | ICD-10-CM | POA: Diagnosis not present

## 2017-05-24 DIAGNOSIS — G8929 Other chronic pain: Secondary | ICD-10-CM | POA: Diagnosis not present

## 2017-05-24 DIAGNOSIS — R262 Difficulty in walking, not elsewhere classified: Secondary | ICD-10-CM | POA: Diagnosis not present

## 2017-05-26 ENCOUNTER — Other Ambulatory Visit: Payer: Self-pay | Admitting: Internal Medicine

## 2017-05-29 DIAGNOSIS — Z794 Long term (current) use of insulin: Secondary | ICD-10-CM | POA: Diagnosis not present

## 2017-05-29 DIAGNOSIS — Z6841 Body Mass Index (BMI) 40.0 and over, adult: Secondary | ICD-10-CM | POA: Diagnosis not present

## 2017-05-29 DIAGNOSIS — Z7982 Long term (current) use of aspirin: Secondary | ICD-10-CM | POA: Diagnosis not present

## 2017-05-29 DIAGNOSIS — I2510 Atherosclerotic heart disease of native coronary artery without angina pectoris: Secondary | ICD-10-CM | POA: Diagnosis not present

## 2017-05-29 DIAGNOSIS — E119 Type 2 diabetes mellitus without complications: Secondary | ICD-10-CM | POA: Diagnosis not present

## 2017-05-29 DIAGNOSIS — K219 Gastro-esophageal reflux disease without esophagitis: Secondary | ICD-10-CM | POA: Diagnosis not present

## 2017-05-29 DIAGNOSIS — Z96653 Presence of artificial knee joint, bilateral: Secondary | ICD-10-CM | POA: Diagnosis not present

## 2017-05-29 DIAGNOSIS — E785 Hyperlipidemia, unspecified: Secondary | ICD-10-CM | POA: Diagnosis not present

## 2017-05-29 DIAGNOSIS — I10 Essential (primary) hypertension: Secondary | ICD-10-CM | POA: Diagnosis not present

## 2017-05-29 DIAGNOSIS — M109 Gout, unspecified: Secondary | ICD-10-CM | POA: Diagnosis not present

## 2017-05-29 DIAGNOSIS — M25561 Pain in right knee: Secondary | ICD-10-CM | POA: Diagnosis not present

## 2017-05-29 DIAGNOSIS — Z8709 Personal history of other diseases of the respiratory system: Secondary | ICD-10-CM | POA: Diagnosis not present

## 2017-05-29 DIAGNOSIS — J42 Unspecified chronic bronchitis: Secondary | ICD-10-CM | POA: Diagnosis not present

## 2017-05-29 DIAGNOSIS — Z7952 Long term (current) use of systemic steroids: Secondary | ICD-10-CM | POA: Diagnosis not present

## 2017-06-01 DIAGNOSIS — E109 Type 1 diabetes mellitus without complications: Secondary | ICD-10-CM | POA: Diagnosis not present

## 2017-06-01 DIAGNOSIS — Z794 Long term (current) use of insulin: Secondary | ICD-10-CM | POA: Diagnosis not present

## 2017-06-03 DIAGNOSIS — Z7952 Long term (current) use of systemic steroids: Secondary | ICD-10-CM | POA: Diagnosis not present

## 2017-06-03 DIAGNOSIS — M109 Gout, unspecified: Secondary | ICD-10-CM | POA: Diagnosis not present

## 2017-06-03 DIAGNOSIS — I10 Essential (primary) hypertension: Secondary | ICD-10-CM | POA: Diagnosis not present

## 2017-06-03 DIAGNOSIS — E119 Type 2 diabetes mellitus without complications: Secondary | ICD-10-CM | POA: Diagnosis not present

## 2017-06-03 DIAGNOSIS — I2510 Atherosclerotic heart disease of native coronary artery without angina pectoris: Secondary | ICD-10-CM | POA: Diagnosis not present

## 2017-06-03 DIAGNOSIS — J42 Unspecified chronic bronchitis: Secondary | ICD-10-CM | POA: Diagnosis not present

## 2017-06-03 DIAGNOSIS — Z7982 Long term (current) use of aspirin: Secondary | ICD-10-CM | POA: Diagnosis not present

## 2017-06-03 DIAGNOSIS — Z8709 Personal history of other diseases of the respiratory system: Secondary | ICD-10-CM | POA: Diagnosis not present

## 2017-06-03 DIAGNOSIS — E785 Hyperlipidemia, unspecified: Secondary | ICD-10-CM | POA: Diagnosis not present

## 2017-06-03 DIAGNOSIS — K219 Gastro-esophageal reflux disease without esophagitis: Secondary | ICD-10-CM | POA: Diagnosis not present

## 2017-06-03 DIAGNOSIS — M25561 Pain in right knee: Secondary | ICD-10-CM | POA: Diagnosis not present

## 2017-06-03 DIAGNOSIS — Z96653 Presence of artificial knee joint, bilateral: Secondary | ICD-10-CM | POA: Diagnosis not present

## 2017-06-03 DIAGNOSIS — Z794 Long term (current) use of insulin: Secondary | ICD-10-CM | POA: Diagnosis not present

## 2017-06-03 DIAGNOSIS — Z6841 Body Mass Index (BMI) 40.0 and over, adult: Secondary | ICD-10-CM | POA: Diagnosis not present

## 2017-06-04 ENCOUNTER — Ambulatory Visit: Payer: Self-pay | Admitting: Pharmacist

## 2017-06-07 ENCOUNTER — Other Ambulatory Visit: Payer: Self-pay | Admitting: Pharmacist

## 2017-06-07 ENCOUNTER — Ambulatory Visit: Payer: Self-pay | Admitting: Pharmacist

## 2017-06-07 DIAGNOSIS — I2510 Atherosclerotic heart disease of native coronary artery without angina pectoris: Secondary | ICD-10-CM | POA: Diagnosis not present

## 2017-06-07 DIAGNOSIS — Z96653 Presence of artificial knee joint, bilateral: Secondary | ICD-10-CM | POA: Diagnosis not present

## 2017-06-07 DIAGNOSIS — I10 Essential (primary) hypertension: Secondary | ICD-10-CM | POA: Diagnosis not present

## 2017-06-07 DIAGNOSIS — Z794 Long term (current) use of insulin: Secondary | ICD-10-CM | POA: Diagnosis not present

## 2017-06-07 DIAGNOSIS — Z8709 Personal history of other diseases of the respiratory system: Secondary | ICD-10-CM | POA: Diagnosis not present

## 2017-06-07 DIAGNOSIS — M109 Gout, unspecified: Secondary | ICD-10-CM | POA: Diagnosis not present

## 2017-06-07 DIAGNOSIS — J42 Unspecified chronic bronchitis: Secondary | ICD-10-CM | POA: Diagnosis not present

## 2017-06-07 DIAGNOSIS — K219 Gastro-esophageal reflux disease without esophagitis: Secondary | ICD-10-CM | POA: Diagnosis not present

## 2017-06-07 DIAGNOSIS — E785 Hyperlipidemia, unspecified: Secondary | ICD-10-CM | POA: Diagnosis not present

## 2017-06-07 DIAGNOSIS — Z6841 Body Mass Index (BMI) 40.0 and over, adult: Secondary | ICD-10-CM | POA: Diagnosis not present

## 2017-06-07 DIAGNOSIS — M25561 Pain in right knee: Secondary | ICD-10-CM | POA: Diagnosis not present

## 2017-06-07 DIAGNOSIS — Z7952 Long term (current) use of systemic steroids: Secondary | ICD-10-CM | POA: Diagnosis not present

## 2017-06-07 DIAGNOSIS — Z7982 Long term (current) use of aspirin: Secondary | ICD-10-CM | POA: Diagnosis not present

## 2017-06-07 DIAGNOSIS — E119 Type 2 diabetes mellitus without complications: Secondary | ICD-10-CM | POA: Diagnosis not present

## 2017-06-07 NOTE — Patient Outreach (Signed)
[facility]) Care Management  06/07/2017  Christy May Nov 02, 1950 [id number]  Patient was called to follow up on insurance transition for 2019.  HIPAA identifiers were obtained. Patient said she did not feel well and that she was currently having some chest discomfort and that she could no longer take care of herself. Patient reported she was hospitalized in [place], [place] and was told she needed to go into rehab in [place] but she did not want to.  Upon coming back home to [facility], patient said she has been struggling to take care of herself but has had some sporatic care from [facility].    Patient said she was unable to get herself to her appointment with Dr. Criss Rosales.  Patient was instructed to call 911.  I called the patient back to check on her and she was still at home and said her aid was with her and she was now ok.  Plan: Call patient back in 3-5 business days. Close pharmacy case.   Elayne Guerin, PharmD, [place] Clinical Pharmacist [phone]

## 2017-06-08 DIAGNOSIS — I10 Essential (primary) hypertension: Secondary | ICD-10-CM | POA: Diagnosis not present

## 2017-06-08 DIAGNOSIS — R262 Difficulty in walking, not elsewhere classified: Secondary | ICD-10-CM | POA: Diagnosis not present

## 2017-06-08 DIAGNOSIS — K219 Gastro-esophageal reflux disease without esophagitis: Secondary | ICD-10-CM | POA: Diagnosis not present

## 2017-06-08 DIAGNOSIS — M79661 Pain in right lower leg: Secondary | ICD-10-CM | POA: Diagnosis not present

## 2017-06-08 DIAGNOSIS — R0989 Other specified symptoms and signs involving the circulatory and respiratory systems: Secondary | ICD-10-CM | POA: Diagnosis not present

## 2017-06-08 DIAGNOSIS — R3981 Functional urinary incontinence: Secondary | ICD-10-CM | POA: Diagnosis not present

## 2017-06-08 DIAGNOSIS — R1084 Generalized abdominal pain: Secondary | ICD-10-CM | POA: Diagnosis not present

## 2017-06-08 DIAGNOSIS — M25571 Pain in right ankle and joints of right foot: Secondary | ICD-10-CM | POA: Diagnosis not present

## 2017-06-08 DIAGNOSIS — G4733 Obstructive sleep apnea (adult) (pediatric): Secondary | ICD-10-CM | POA: Diagnosis not present

## 2017-06-08 DIAGNOSIS — M6281 Muscle weakness (generalized): Secondary | ICD-10-CM | POA: Diagnosis not present

## 2017-06-08 DIAGNOSIS — R609 Edema, unspecified: Secondary | ICD-10-CM | POA: Diagnosis not present

## 2017-06-08 DIAGNOSIS — J80 Acute respiratory distress syndrome: Secondary | ICD-10-CM | POA: Diagnosis not present

## 2017-06-08 DIAGNOSIS — R5381 Other malaise: Secondary | ICD-10-CM | POA: Diagnosis not present

## 2017-06-08 DIAGNOSIS — R52 Pain, unspecified: Secondary | ICD-10-CM | POA: Diagnosis not present

## 2017-06-08 DIAGNOSIS — M545 Low back pain: Secondary | ICD-10-CM | POA: Diagnosis not present

## 2017-06-08 DIAGNOSIS — M25519 Pain in unspecified shoulder: Secondary | ICD-10-CM | POA: Diagnosis not present

## 2017-06-08 DIAGNOSIS — R0981 Nasal congestion: Secondary | ICD-10-CM | POA: Diagnosis not present

## 2017-06-08 DIAGNOSIS — M47816 Spondylosis without myelopathy or radiculopathy, lumbar region: Secondary | ICD-10-CM | POA: Diagnosis not present

## 2017-06-08 DIAGNOSIS — F419 Anxiety disorder, unspecified: Secondary | ICD-10-CM | POA: Diagnosis not present

## 2017-06-08 DIAGNOSIS — R600 Localized edema: Secondary | ICD-10-CM | POA: Diagnosis not present

## 2017-06-08 DIAGNOSIS — E669 Obesity, unspecified: Secondary | ICD-10-CM | POA: Diagnosis not present

## 2017-06-08 DIAGNOSIS — M109 Gout, unspecified: Secondary | ICD-10-CM | POA: Diagnosis not present

## 2017-06-08 DIAGNOSIS — M479 Spondylosis, unspecified: Secondary | ICD-10-CM | POA: Diagnosis not present

## 2017-06-08 DIAGNOSIS — W19XXXA Unspecified fall, initial encounter: Secondary | ICD-10-CM | POA: Diagnosis not present

## 2017-06-08 DIAGNOSIS — R05 Cough: Secondary | ICD-10-CM | POA: Diagnosis not present

## 2017-06-08 DIAGNOSIS — M62831 Muscle spasm of calf: Secondary | ICD-10-CM | POA: Diagnosis not present

## 2017-06-08 DIAGNOSIS — M25511 Pain in right shoulder: Secondary | ICD-10-CM | POA: Diagnosis not present

## 2017-06-08 DIAGNOSIS — M48061 Spinal stenosis, lumbar region without neurogenic claudication: Secondary | ICD-10-CM | POA: Diagnosis not present

## 2017-06-08 DIAGNOSIS — R0602 Shortness of breath: Secondary | ICD-10-CM | POA: Diagnosis not present

## 2017-06-08 DIAGNOSIS — R296 Repeated falls: Secondary | ICD-10-CM | POA: Diagnosis not present

## 2017-06-08 DIAGNOSIS — M79621 Pain in right upper arm: Secondary | ICD-10-CM | POA: Diagnosis not present

## 2017-06-08 DIAGNOSIS — N3281 Overactive bladder: Secondary | ICD-10-CM | POA: Diagnosis not present

## 2017-06-08 DIAGNOSIS — R350 Frequency of micturition: Secondary | ICD-10-CM | POA: Diagnosis not present

## 2017-06-08 DIAGNOSIS — D649 Anemia, unspecified: Secondary | ICD-10-CM | POA: Diagnosis not present

## 2017-06-08 DIAGNOSIS — J40 Bronchitis, not specified as acute or chronic: Secondary | ICD-10-CM | POA: Diagnosis not present

## 2017-06-08 DIAGNOSIS — M25561 Pain in right knee: Secondary | ICD-10-CM | POA: Diagnosis not present

## 2017-06-08 DIAGNOSIS — E119 Type 2 diabetes mellitus without complications: Secondary | ICD-10-CM | POA: Diagnosis not present

## 2017-06-08 DIAGNOSIS — R51 Headache: Secondary | ICD-10-CM | POA: Diagnosis not present

## 2017-06-08 DIAGNOSIS — M25551 Pain in right hip: Secondary | ICD-10-CM | POA: Diagnosis not present

## 2017-06-09 ENCOUNTER — Ambulatory Visit: Payer: PPO | Admitting: Podiatry

## 2017-06-10 ENCOUNTER — Telehealth: Payer: Self-pay | Admitting: Pharmacist

## 2017-06-10 NOTE — Patient Outreach (Signed)
[facility]) Care Management  06/10/2017  Christy May 1950-10-22 [id number]   Called patient. HIPAA identifiers were obtained. Patient confirmed she was in [facility] (SNF). Patient said she was told she would be in the facility for at least two weeks but if she begins to improve, she will be able to stay longer.  Plan: Close the pharmacy case since patient is in a skilled nursing facility.  Pharmacy case can be reopened when patient is discharged from the SNF.  Elayne Guerin, PharmD, [place] Clinical Pharmacist [phone]

## 2017-06-11 ENCOUNTER — Other Ambulatory Visit: Payer: Self-pay | Admitting: *Deleted

## 2017-06-11 NOTE — Patient Outreach (Signed)
[facility]) Care Management  06/11/2017  Christy May [birth year] [id number]   Case closure. Consumer  in [facility]  skilled nursing facility. Plan: Refer to social worker Refer to Care management coordinator [place] [facility] [phone]

## 2017-06-14 ENCOUNTER — Ambulatory Visit: Payer: Self-pay | Admitting: Pharmacist

## 2017-06-14 DIAGNOSIS — M6281 Muscle weakness (generalized): Secondary | ICD-10-CM | POA: Diagnosis not present

## 2017-06-14 DIAGNOSIS — E119 Type 2 diabetes mellitus without complications: Secondary | ICD-10-CM | POA: Diagnosis not present

## 2017-06-14 DIAGNOSIS — M109 Gout, unspecified: Secondary | ICD-10-CM | POA: Diagnosis not present

## 2017-06-14 DIAGNOSIS — R600 Localized edema: Secondary | ICD-10-CM | POA: Diagnosis not present

## 2017-06-16 ENCOUNTER — Encounter: Payer: Self-pay | Admitting: *Deleted

## 2017-06-23 DIAGNOSIS — R600 Localized edema: Secondary | ICD-10-CM | POA: Diagnosis not present

## 2017-06-23 DIAGNOSIS — E669 Obesity, unspecified: Secondary | ICD-10-CM | POA: Diagnosis not present

## 2017-06-23 DIAGNOSIS — M6281 Muscle weakness (generalized): Secondary | ICD-10-CM | POA: Diagnosis not present

## 2017-06-23 DIAGNOSIS — M25519 Pain in unspecified shoulder: Secondary | ICD-10-CM | POA: Diagnosis not present

## 2017-06-23 DIAGNOSIS — W19XXXA Unspecified fall, initial encounter: Secondary | ICD-10-CM | POA: Diagnosis not present

## 2017-06-23 DIAGNOSIS — M545 Low back pain: Secondary | ICD-10-CM | POA: Diagnosis not present

## 2017-06-24 ENCOUNTER — Encounter: Payer: Self-pay | Admitting: *Deleted

## 2017-06-24 ENCOUNTER — Other Ambulatory Visit: Payer: Self-pay | Admitting: *Deleted

## 2017-06-24 DIAGNOSIS — M25511 Pain in right shoulder: Secondary | ICD-10-CM | POA: Diagnosis not present

## 2017-06-24 NOTE — Patient Outreach (Signed)
[facility]) Care Management  06/24/2017  Christy May 1951/06/02 [id number]   CSW was able to make contact with patient today to perform the initial assessment, as well as assess and assist with social work needs and services, when [place] met with patient at [facility] [place] where patient currently resides to receive short-term rehabilitative services.  CSW introduced self, explained role and types of services provided through [place] [facility] ([place] [facility]).  CSW further explained to patient that CSW works with patient's [place], also with [place] [facility], Johny Shock. CSW then explained the reason for the visit, indicating that Mrs. Pleasant thought that patient would benefit from social work services and resources to assist with discharge planning needs and services from the skilled nursing facility.  CSW obtained two HIPAA compliant identifiers from patient, which included patient's name and date of birth. Patient reported that she is finally able to bare weight on both legs and take about 10 steps with the assistance of a rolling walker.  Patient admitted that she is still very unsteady on her feet; therefore, a therapist walks beside her and a therapist walks behind her with a wheelchair, in the event that she becomes to weak and fatigued to make it to there physical therapy room and back.  Patient reported that she has already fallen at the facility; therefore, when she is alone, she has been instructed to only get around with the aide of her wheelchair.  Patient does not have a tentative discharge date set yet, as she is hoping to get a lot more stronger before returning home to live alone.  CSW agreed to follow-up with patient again in two weeks to assess and assist with discharge planning needs and services.  Patient voiced understanding and was agreeable to this plan. THN CM Care Plan Problem  One     Most Recent Value  Care Plan Problem One  Level of care issues.  Role Documenting the Problem One  Clinical Social Worker  Care Plan for Problem One  Active  [facility] Long Term Goal   Patient will have home health services and durable medical equipment in place, within the next 40 days.  THN Long Term Goal Start Date  06/24/17  Interventions for Problem One Long Term Goal  CSW will assist patient and discharge planning coordinator at the skilled facility with arranging discharge plans for patient.  THN CM Short Term Goal #1   Patient will decide on a home health agency of choice, within the next three weeks.  THN CM Short Term Goal #1 Start Date  06/24/17  Interventions for Short Term Goal #1  Patient has been given a list of home health agencies that offer home care services.  THN CM Short Term Goal #2   Patient will decide on an agency of choice, within the next three weeks.  THN CM Short Term Goal #2 Start Date  06/24/17  Interventions for Short Term Goal #2  Patient has been given a list of agencies that offer durable medical equipment.    Nat Christen, BSW, MSW, LCSW  Licensed Education officer, environmental Health System  Mailing [place] [address] [place], [place] [address] Physical Address-[address], [place],  [address] Toll Free Main # [phone] Fax # [phone] Cell # [phone]  Office # [phone] [email or web address]

## 2017-06-29 ENCOUNTER — Other Ambulatory Visit: Payer: Self-pay | Admitting: Internal Medicine

## 2017-06-29 DIAGNOSIS — R0989 Other specified symptoms and signs involving the circulatory and respiratory systems: Secondary | ICD-10-CM | POA: Diagnosis not present

## 2017-06-29 DIAGNOSIS — R519 Headache, unspecified: Secondary | ICD-10-CM

## 2017-06-29 DIAGNOSIS — Z9181 History of falling: Secondary | ICD-10-CM

## 2017-06-29 DIAGNOSIS — J40 Bronchitis, not specified as acute or chronic: Secondary | ICD-10-CM | POA: Diagnosis not present

## 2017-06-29 DIAGNOSIS — M6281 Muscle weakness (generalized): Secondary | ICD-10-CM | POA: Diagnosis not present

## 2017-06-29 DIAGNOSIS — M62831 Muscle spasm of calf: Secondary | ICD-10-CM | POA: Diagnosis not present

## 2017-06-29 DIAGNOSIS — R51 Headache: Principal | ICD-10-CM

## 2017-06-29 DIAGNOSIS — R05 Cough: Secondary | ICD-10-CM | POA: Diagnosis not present

## 2017-06-29 DIAGNOSIS — M545 Low back pain: Secondary | ICD-10-CM | POA: Diagnosis not present

## 2017-07-02 ENCOUNTER — Ambulatory Visit
Admission: RE | Admit: 2017-07-02 | Discharge: 2017-07-02 | Disposition: A | Discharge status: Home / Self Care | Payer: PPO | Source: Ambulatory Visit | Attending: Internal Medicine | Admitting: Internal Medicine

## 2017-07-02 DIAGNOSIS — R519 Headache, unspecified: Secondary | ICD-10-CM

## 2017-07-02 DIAGNOSIS — R51 Headache: Principal | ICD-10-CM

## 2017-07-02 DIAGNOSIS — Z9181 History of falling: Secondary | ICD-10-CM

## 2017-07-05 DIAGNOSIS — N3281 Overactive bladder: Secondary | ICD-10-CM | POA: Diagnosis not present

## 2017-07-05 DIAGNOSIS — R3981 Functional urinary incontinence: Secondary | ICD-10-CM | POA: Diagnosis not present

## 2017-07-05 DIAGNOSIS — R350 Frequency of micturition: Secondary | ICD-10-CM | POA: Diagnosis not present

## 2017-07-06 DIAGNOSIS — M545 Low back pain: Secondary | ICD-10-CM | POA: Diagnosis not present

## 2017-07-06 DIAGNOSIS — R5381 Other malaise: Secondary | ICD-10-CM | POA: Diagnosis not present

## 2017-07-06 DIAGNOSIS — R262 Difficulty in walking, not elsewhere classified: Secondary | ICD-10-CM | POA: Diagnosis not present

## 2017-07-06 DIAGNOSIS — M6281 Muscle weakness (generalized): Secondary | ICD-10-CM | POA: Diagnosis not present

## 2017-07-07 ENCOUNTER — Other Ambulatory Visit (HOSPITAL_COMMUNITY): Payer: Self-pay | Admitting: Orthopedic Surgery

## 2017-07-07 DIAGNOSIS — R05 Cough: Secondary | ICD-10-CM | POA: Diagnosis not present

## 2017-07-07 DIAGNOSIS — R1084 Generalized abdominal pain: Secondary | ICD-10-CM | POA: Diagnosis not present

## 2017-07-07 DIAGNOSIS — R0981 Nasal congestion: Secondary | ICD-10-CM | POA: Diagnosis not present

## 2017-07-07 DIAGNOSIS — M6281 Muscle weakness (generalized): Secondary | ICD-10-CM | POA: Diagnosis not present

## 2017-07-07 DIAGNOSIS — M545 Low back pain: Secondary | ICD-10-CM

## 2017-07-08 ENCOUNTER — Other Ambulatory Visit: Payer: Self-pay | Admitting: *Deleted

## 2017-07-08 NOTE — Patient Outreach (Signed)
Christy May) Care Management  07/08/2017  Christy May [id number]  CSW was able to meet with patient today at [facility] [place] where patient currently resides to receive short-term rehabilitative services, to perform a routine visit.  Patient admits to having a great deal of pain on the right side of her head, as well as in her right arm, where she fell at the facility on Monday, June 21, 2017.  Patient has undergone an MRI and x-rays and has been medically cleared.  Patient also admits to experiencing pain in her lower back, due to a pinched nerve.  Patient reports that her progress with therapies (both physical and occupational) has been slow, as a result of all the pain she is experiencing.  Patient denies having a tentative discharge date, as of yet, but reports that she will be returning home to live independently at time of discharge from [facility].  CSW agreed to follow-up with patient again in two weeks to assess and assist with discharge planning needs and services. THN CM Care Plan Problem One     Most Recent Value  Care Plan Problem One  Level of care issues.  Role Documenting the Problem One  Clinical Social Worker  Care Plan for Problem One  Active  [facility] Long Term Goal   Patient will have home health services and durable medical equipment in place, within the next 40 days.  THN Long Term Goal Start Date  06/24/17  Interventions for Problem One Long Term Goal  CSW will assist patient and discharge planning coordinator at the skilled facility with arranging discharge plans for patient.  THN CM Short Term Goal #1   Patient will decide on a home health agency of choice, within the next three weeks.  THN CM Short Term Goal #1 Start Date  06/24/17  Interventions for Short Term Goal #1  Patient has been given a list of home health agencies that offer home care services.  THN CM Short Term Goal #2    Patient will decide on an agency of choice, within the next three weeks.  THN CM Short Term Goal #2 Start Date  06/24/17  [facility] CM Short Term Goal #2 Met Date  07/08/17  Interventions for Short Term Goal #2  Patient has been given a list of agencies that offer durable medical equipment.    Nat Christen, BSW, MSW, LCSW  Licensed Education officer, environmental Health System  Mailing [place] [address], [place], [place] [address] Physical Address-[address], [place], [place] [address] Toll Free Main # [phone] Fax # [phone] Cell # [phone]  Office # [phone] [name]

## 2017-07-14 ENCOUNTER — Other Ambulatory Visit: Payer: Self-pay | Admitting: *Deleted

## 2017-07-14 ENCOUNTER — Ambulatory Visit (HOSPITAL_COMMUNITY)
Admission: RE | Admit: 2017-07-14 | Discharge: 2017-07-14 | Disposition: A | Discharge status: Home / Self Care | Payer: Medicare Other | Source: Ambulatory Visit | Attending: Orthopedic Surgery | Admitting: Orthopedic Surgery

## 2017-07-14 DIAGNOSIS — M545 Low back pain: Secondary | ICD-10-CM | POA: Insufficient documentation

## 2017-07-14 DIAGNOSIS — M48061 Spinal stenosis, lumbar region without neurogenic claudication: Secondary | ICD-10-CM | POA: Diagnosis not present

## 2017-07-14 DIAGNOSIS — M47816 Spondylosis without myelopathy or radiculopathy, lumbar region: Secondary | ICD-10-CM | POA: Diagnosis not present

## 2017-07-14 DIAGNOSIS — M6281 Muscle weakness (generalized): Secondary | ICD-10-CM | POA: Diagnosis not present

## 2017-07-14 DIAGNOSIS — R296 Repeated falls: Secondary | ICD-10-CM | POA: Diagnosis not present

## 2017-07-14 NOTE — Patient Outreach (Signed)
[facility]) Care Management  07/14/2017  Christy May Jan 10, 1951 [id number]   During [facility] facility visit, this RNCM met with Social Worker/discharge planner.  There are a lot of concerns about patient being able to go home alone, she has poor support.  Patient affirms to facility that she plans to go home, she has worked hard for her home.  She does not qualify for Medicaid.  SW states patient will need as much support as she can get to make it at home.  She does plan to refer to [employer].   Attempted to meet with patient she was leaving the facility to go out for an appointment.  RNCM will update [facility] care team involved in patient case.  RNCM will request to assess for home health aide resource if patient qualifies   Royetta Crochet. Laymond Purser, RN, BSN, [place] [phone]) Business Cell  [phone]) Toll Free Office

## 2017-07-15 DIAGNOSIS — M25561 Pain in right knee: Secondary | ICD-10-CM | POA: Diagnosis not present

## 2017-07-15 DIAGNOSIS — M25551 Pain in right hip: Secondary | ICD-10-CM | POA: Diagnosis not present

## 2017-07-15 DIAGNOSIS — R5381 Other malaise: Secondary | ICD-10-CM | POA: Diagnosis not present

## 2017-07-15 DIAGNOSIS — M79621 Pain in right upper arm: Secondary | ICD-10-CM | POA: Diagnosis not present

## 2017-07-15 DIAGNOSIS — W19XXXA Unspecified fall, initial encounter: Secondary | ICD-10-CM | POA: Diagnosis not present

## 2017-07-15 DIAGNOSIS — M79661 Pain in right lower leg: Secondary | ICD-10-CM | POA: Diagnosis not present

## 2017-07-15 DIAGNOSIS — M25571 Pain in right ankle and joints of right foot: Secondary | ICD-10-CM | POA: Diagnosis not present

## 2017-07-16 DIAGNOSIS — M25571 Pain in right ankle and joints of right foot: Secondary | ICD-10-CM | POA: Diagnosis not present

## 2017-07-16 DIAGNOSIS — M6281 Muscle weakness (generalized): Secondary | ICD-10-CM | POA: Diagnosis not present

## 2017-07-16 DIAGNOSIS — F419 Anxiety disorder, unspecified: Secondary | ICD-10-CM | POA: Diagnosis not present

## 2017-07-26 ENCOUNTER — Telehealth: Payer: Self-pay | Admitting: Endocrinology

## 2017-07-26 ENCOUNTER — Telehealth: Payer: Self-pay | Admitting: Emergency Medicine

## 2017-07-26 NOTE — Telephone Encounter (Signed)
Pt is calling to make sure dr is aware that she is in rehab center at [facility] healthcare center. [address]

## 2017-07-26 NOTE — Telephone Encounter (Signed)
Attempted to contact pt. No answer, no option to leave a message. Will try back.  

## 2017-07-27 ENCOUNTER — Other Ambulatory Visit: Payer: Self-pay | Admitting: *Deleted

## 2017-07-27 DIAGNOSIS — M545 Low back pain: Secondary | ICD-10-CM | POA: Diagnosis not present

## 2017-07-27 DIAGNOSIS — M6281 Muscle weakness (generalized): Secondary | ICD-10-CM | POA: Diagnosis not present

## 2017-07-27 DIAGNOSIS — R52 Pain, unspecified: Secondary | ICD-10-CM | POA: Diagnosis not present

## 2017-07-27 DIAGNOSIS — M479 Spondylosis, unspecified: Secondary | ICD-10-CM | POA: Diagnosis not present

## 2017-07-27 NOTE — Telephone Encounter (Signed)
Ok thank you for this info

## 2017-07-27 NOTE — Telephone Encounter (Signed)
Called and spoke with pt. Pt states since her last OV on 03/15/17, she has had an admission around Thanksgiving at [facility] for legs collapsing. Pt states she did not have any resp symptoms at the time of admission.  Pt states she was also seen at [facility] for bronchitis 06/08/18. Pt states she is overcoming bronchitis but does still have some chest/nasal congestion and GERD.  Pt states she is currently at rehab facility but should be going home this week.  Pt does not have a pending apt, but stated she will call to schedule once she is home.   Will route to RB to make aware.

## 2017-07-27 NOTE — Patient Outreach (Signed)
[facility]) Care Management  07/28/2017  Christy May [birth year] [id number]  CSW covering for Nat Christen, LCSW, received call back from SNF rep who reports patient is still at rehab and is tentatively set to be released to home later this week.  Per SNF rep, patient does not have Medicaid and will be set up with [facility] care ([place]) at dc.  CSW inquired about her rehab progress and have left a message for the Rehab team to call with update.   This covering CSW will brief CSW, Nat Christen, LCSW, upon her return tomorrow.   Christy May, MSW, [place] Worker  [place] [phone]

## 2017-07-28 ENCOUNTER — Other Ambulatory Visit: Payer: Self-pay | Admitting: *Deleted

## 2017-07-28 DIAGNOSIS — E119 Type 2 diabetes mellitus without complications: Secondary | ICD-10-CM | POA: Diagnosis not present

## 2017-07-28 DIAGNOSIS — K219 Gastro-esophageal reflux disease without esophagitis: Secondary | ICD-10-CM | POA: Diagnosis not present

## 2017-07-28 DIAGNOSIS — I10 Essential (primary) hypertension: Secondary | ICD-10-CM | POA: Diagnosis not present

## 2017-07-28 DIAGNOSIS — M6281 Muscle weakness (generalized): Secondary | ICD-10-CM | POA: Diagnosis not present

## 2017-07-28 DIAGNOSIS — J40 Bronchitis, not specified as acute or chronic: Secondary | ICD-10-CM | POA: Diagnosis not present

## 2017-07-28 DIAGNOSIS — E669 Obesity, unspecified: Secondary | ICD-10-CM | POA: Diagnosis not present

## 2017-07-28 DIAGNOSIS — M109 Gout, unspecified: Secondary | ICD-10-CM | POA: Diagnosis not present

## 2017-07-28 DIAGNOSIS — G4733 Obstructive sleep apnea (adult) (pediatric): Secondary | ICD-10-CM | POA: Diagnosis not present

## 2017-07-28 DIAGNOSIS — M545 Low back pain: Secondary | ICD-10-CM | POA: Diagnosis not present

## 2017-07-28 NOTE — Patient Outreach (Addendum)
[facility]) Care Management  07/28/2017  Christy May [birth year] [id number]   During facility visit, Marita [place], SW confirmed patient to discharge home 2/7. They have ordered a hospital bed and w/c. They have made a [employer] referral.  [facility].  Plan to let [facility] care team know of this information. This RNCM will sign off. Royetta Crochet. Laymond Purser, RN, BSN, [place] [phone]) Business Cell  [phone]) Toll Free Office

## 2017-07-30 ENCOUNTER — Other Ambulatory Visit: Payer: Self-pay | Admitting: *Deleted

## 2017-07-30 ENCOUNTER — Encounter: Payer: Self-pay | Admitting: *Deleted

## 2017-07-30 ENCOUNTER — Telehealth: Payer: Self-pay | Admitting: Endocrinology

## 2017-07-30 MED ORDER — ONETOUCH LANCETS MISC: 3 refills | Status: DC

## 2017-07-30 MED ORDER — GLUCOSE BLOOD VI STRP: ORAL_STRIP | 3 refills | Status: DC

## 2017-07-30 MED ORDER — ONETOUCH VERIO W/DEVICE KIT: 1.0000 | PACK | Freq: Once | 0 refills | Status: DC

## 2017-07-30 NOTE — Telephone Encounter (Signed)
Patient need a new meter, she do not know what kind it is. (she said its talks)  Please advise

## 2017-07-30 NOTE — Patient Outreach (Signed)
[facility]) Care Management  07/30/2017  KEAJAH KILLOUGH [birth year] [id number]   New referral received from LCSW as member was recently discharged from rehab facility.  She is known to [facility] as she has been active multiple time over the past few years.  She has documented history of hypertension, sleep apnea, GERD, diabetes, degenerative joint disease, obesity, depression, and reported history of fibromyalgia.   Call placed to member to initiate transition of care program, identity verified.  She immediately report she is in need of help.  State she was discharged yesterday and fell as soon as she got home.  Report EMS had to come help her up.  State she has contacted [facility] to inquire about when they will be out to the home for assessment.  She is advised that home health may not provide the amount of in home hours she is in need of.  Also advised that cost for in home assistance will have to be out of pocket as she does not qualify for Medicaid.  Discussed long term placement as an option, she declines, stating she would like to stay in her home.    Attempted to review medications, state she does not have them, denies being given any upon discharge.  She also state that she left her glucose meter at the facility and is unable to check her blood sugars.  She report she will have to get someone to go to facility to obtain it.  Once this is done, she will restart glucose checks.   She has appointments next week with orthopedic specialist and primary MD.  Report she will use [facility] for transportation.  Denies any urgent concerns at this time.  Agrees to home visit within the next 2 weeks, will follow up telephonically next week.   THN CM Care Plan Problem One     Most Recent Value  Care Plan Problem One  Risk for hospitalization related to inability to manage chronic health conditions as evidenced by recent stay at rehab facility  Role Documenting the Problem One  Care  Management [place] for Problem One  Active  [place] Term Goal   Member will not have acute admission within the next 31 days  THN Long Term Goal Start Date  07/30/17  Interventions for Problem One Long Term Goal  Discussed with member the importance of following discharge instructions, including follow up appointments, medications, diet, and home health involvement, to decrease the risk of readmission  THN CM Short Term Goal #1   Member will have medications on discharge list within the next week  THN CM Short Term Goal #1 Start Date  07/30/17  Interventions for Short Term Goal #1  Attempted to review medications, referral placed to [facility] pharmacist  [facility] CM Short Term Goal #2   Member will keep and attend appointment with primary MD office within the next week.  THN CM Short Term Goal #2 Start Date  07/30/17  Interventions for Short Term Goal #2  Confirmed member has follow up appointment scheduled for next week.  Confirmed she has transportation, will use [facility].     [name], [place], MSN [place] [phone]

## 2017-07-30 NOTE — Patient Outreach (Signed)
[facility]) Care Management  07/30/2017  Christy May 04/25/1951 [id number]   CSW was able to make contact with patient today to follow-up regarding social work services and resources, as well as to assess need for any additional social work needs and services.  Patient reported that she was discharged from [facility] [place] where patient was residing to receive short-term rehabilitative services, on Thursday, July 29, 2017, returning home to live alone.  Patient admitted that she had a little trouble upon entering her home after discharge, having fallen over the threshold leading into her home.  Patient utilized her Forensic scientist system which automatically alerted the paramedics.  Patient reported that they came over, performed a thorough assessment on her to ensure that she was not hurt, then helped her get back into her wheelchair.  Patient believes that she was discharged from the skilled facility too soon, concerned that she is still unable to walk more than a few feet at a time.  Arrangements have been made for patient to receive home health services through [name].  These services include all of the following:  Home health nurse, physical therapist, occupational therapist and aide.  Patient was planning to contact a representative with [facility] to find out when they will be able to begin providing services.  Patient assured CSW that she is able to perform all activities of daily living independently.  Patient further reported that she has all her medications and has transportation lined up to all her follow-up appointments. CSW will perform a case closure on patient, as all goals of treatment have been met from social work standpoint and no additional social work needs have been identified at this time.  CSW will arrange for patient to receive a home health nurse through [facility] for  transition of care.  CSW will fax an update to patient's Primary Care Physician, Dr. Lucianne Lei to ensure that they are aware of CSW's involvement with patient's plan of care.  CSW will submit a case closure request to Alycia Rossetti, Care Management Assistant with [place] [facility], in the form of an [employer].  THN CM Care Plan Problem One     Most Recent Value  Care Plan Problem One  Level of care issues.  Role Documenting the Problem One  Clinical Social Worker  Care Plan for Problem One  Active  [facility] Long Term Goal   Patient will have home health services and durable medical equipment in place, within the next 40 days.  THN Long Term Goal Start Date  06/24/17  THN Long Term Goal Met Date  07/30/17  Interventions for Problem One Long Term Goal  CSW will assist patient and discharge planning coordinator at the skilled facility with arranging discharge plans for patient.  THN CM Short Term Goal #1   Patient will decide on a home health agency of choice, within the next three weeks.  THN CM Short Term Goal #1 Start Date  06/24/17  [facility] CM Short Term Goal #1 Met Date  07/30/17  Interventions for Short Term Goal #1  Patient has been given a list of home health agencies that offer home care services.  THN CM Short Term Goal #2   Patient will decide on an agency of choice, within the next three weeks.  THN CM Short Term Goal #2 Start Date  06/24/17  [facility] CM Short Term Goal #2  Met Date  07/08/17  Interventions for Short Term Goal #2  Patient has been given a list of agencies that offer durable medical equipment.    Nat Christen, BSW, MSW, LCSW  Licensed Education officer, environmental Health System  Mailing [place] [address] [place], [place] [address] Physical Address-[address], [place], [place] [address] Toll Free Main # [phone] Fax # [phone] Cell # [phone]  Office #  [phone] [email or web address]

## 2017-08-02 ENCOUNTER — Ambulatory Visit: Payer: Self-pay | Admitting: *Deleted

## 2017-08-04 ENCOUNTER — Telehealth: Payer: Self-pay | Admitting: Pharmacist

## 2017-08-04 DIAGNOSIS — M5416 Radiculopathy, lumbar region: Secondary | ICD-10-CM | POA: Diagnosis not present

## 2017-08-04 NOTE — Patient Outreach (Signed)
[facility]) Care Management  08/04/2017  Christy May 1950-08-09 [id number]   Called patient regarding medication reconciliation post discharge. Unfortunately, patient did not answer the phone. HIPAA compliant message left on the patient's voicemail.  Plan: Call patient back tomorrow per protocol.  Elayne Guerin, PharmD, [place] Clinical Pharmacist [phone]

## 2017-08-06 ENCOUNTER — Other Ambulatory Visit: Payer: Self-pay | Admitting: *Deleted

## 2017-08-06 NOTE — Patient Outreach (Signed)
[facility]) Care Management  08/06/2017  YEVETTE KNUST [birth year] [id number]   Weekly transition of care call placed to member.  She continue to express the difficulty being able to care for herself independently in the home, but does not consider placement as an option.  She report she has changed her insurance and would like to pursue in home assistance.  She is advised again that despite changing insurance payors, in home aide assistance would have to be paid out of pocket.  Advised that having Medicaid would allow services to be paid for, however she is not eligible for Medicaid.    Report she was initially set to have home health with [place], but they had wrong address and services were delayed.  She then switched to [facility] and had initial visit by RN, but declined their services when she was made aware that they would only be able to provide aide services twice a week for 2 weeks.  She has not switched back to [facility] (will have PT, OT, RN, & aide), state she was told she would be able to have more aide assistance.  Advised that aide services may only be available as long as she is involved with home health.  Notified her that she will still need to have a long term plan in place.  This care manager inquired about member's support system.  State her daughter lives 45 minutes away, came to visit over the weekend, but didn't stay.  She report "she has her own issues."  State she had 2 nieces and a brother that came to rearrange her home in anticipation of having a hospital bed and wheelchair delivered tomorrow.  Has had help from her friends/neighbors to bring some food, but also state she has been ordering food (had [place] last night).  She is requesting mobile meals.  Advised that this care manager will contact LCSW to inquire about application however at last check, they were no longer accepting applications due to a 2 year waiting list.    Report she had to cancel  yesterday's appointment with primary MD due to inability to use [facility].  State they wouldn't take her using the rollator due to safety concerns, advised her to reschedule after having a wheelchair.  She has rescheduled for 2/21.  Also state that she was seen by her orthopedic specialist, had an MRI done and will need injections next week due to a pinched nerve in her back.  Appointment scheduled for Monday.  Aside from additional support, she denies any urgent concerns.  Agrees to home visit next week.  THN CM Care Plan Problem One     Most Recent Value  Care Plan Problem One  Risk for hospitalization related to inability to manage chronic health conditions as evidenced by recent stay at rehab facility  Role Documenting the Problem One  Care Management [place] for Problem One  Active  [place] Term Goal   Member will not have acute admission within the next 31 days  THN Long Term Goal Start Date  07/30/17  Interventions for Problem One Long Term Goal  Discussed support system with member that could contribute to decreasing risk of admission.  Advised that in home aide assistance is available but will have to be paid out of pocket. Will contact LCSW to inquire about potential mobile meals.  THN CM Short Term Goal #1   Member will have medications on discharge list within the next week  THN CM Short Term Goal #1 Start Date  08/06/17 [Not med, date reset]  Interventions for Short Term Goal #1  Advised to contact [facility] pharmacist again in effort to perform medication reconciliation as member still report she may not have all medications  THN CM Short Term Goal #2   Member will keep and attend appointment with primary MD office within the next week.  THN CM Short Term Goal #2 Start Date  08/06/17 [Not met, date reset]  Interventions for Short Term Goal #2  Confirmed that member has appointment rescheduled.  Advised of importance of keeping appointment in effort to decrease risk of readmission.      [name], [place], MSN [place] [phone]

## 2017-08-08 ENCOUNTER — Other Ambulatory Visit: Payer: Self-pay | Admitting: Gastroenterology

## 2017-08-09 ENCOUNTER — Encounter: Payer: Self-pay | Admitting: *Deleted

## 2017-08-11 ENCOUNTER — Ambulatory Visit: Payer: Self-pay | Admitting: *Deleted

## 2017-08-11 ENCOUNTER — Ambulatory Visit: Payer: Self-pay | Admitting: Endocrinology

## 2017-08-11 ENCOUNTER — Other Ambulatory Visit: Payer: Self-pay | Admitting: *Deleted

## 2017-08-11 NOTE — Patient Outreach (Signed)
[facility]) Care Management  08/11/2017  MADALEE ALTMANN [birth year] [id number]   Call placed to member regarding scheduled home visit for this afternoon.  Noted per chart that she has appointment with endocrinologist this afternoon and this care manager has to reschedule due to scheduling conflict.  She agrees to home visit for next week.    Continue to report that she does not support in the home.  State she has received her wheelchair and hospital bed, but has not had [place] start for home health services.  Discussed options for increased support, continue to report that her daughter is not available as she is out of the area.  This care manager inquires about placement in assisted living.  She remains adamant about not wanting to be placed, want to remain in her home.    She was not aware of appointment with endocrinologist this afternoon, but state she will attempt to secure transportation by calling a friend.  Attempted to review medications, but state she will have to end conversation to reach out to friend for transportation.  She does report she has appointment tomorrow with primary MD, state she will call [facility] to secure transportation.    Case discussed with Assistant Clinical director regarding minimal support in the home.  Permission granted to have member receive mobile meals through [facility] over the next month. Also advised to have LCSW review Medicaid potential again in effort to provide support in the home.  Will also contact [place] regarding possible involvement.  [facility] CM Care Plan Problem One     Most Recent Value  Care Plan Problem One  Risk for hospitalization related to inability to manage chronic health conditions as evidenced by recent stay at rehab facility  Role Documenting the Problem One  Care Management [place] for Problem One  Active  [place] Term Goal   Member will not have acute admission within the next 31 days   [facility] Long Term Goal Start Date  07/30/17  Interventions for Problem One Long Term Goal  Collaboration with Clinical assistant director regarding resources to provide support for member.  Will place new referral for LCSW to research potential Medicaid options.  [facility] CM Short Term Goal #1   Member will have medications on discharge list within the next week  [facility] CM Short Term Goal #1 Start Date  08/06/17 [Not med, date reset]  Interventions for Short Term Goal #1  Discussed importance of contact with [facility] pharmacist.  Collaboration with pharmacist regarding optimal medication management  THN CM Short Term Goal #2   Member will keep and attend appointment with primary MD office within the next week.  THN CM Short Term Goal #2 Start Date  08/06/17  Interventions for Short Term Goal #2  Reminded of importance of attending appointment on 2/21, advised to contact [facility] to secure transportation.     [name], [place], MSN [place] [phone]

## 2017-08-12 ENCOUNTER — Inpatient Hospital Stay (HOSPITAL_COMMUNITY)
Admission: EM | Admit: 2017-08-12 | Discharge: 2017-08-17 | DRG: 872 | Disposition: A | Discharge status: Skilled Nursing Facility | Payer: Medicare Other | Attending: Family Medicine | Admitting: Family Medicine

## 2017-08-12 ENCOUNTER — Other Ambulatory Visit: Payer: Self-pay | Admitting: Pharmacist

## 2017-08-12 ENCOUNTER — Encounter (HOSPITAL_COMMUNITY): Payer: Self-pay | Admitting: Emergency Medicine

## 2017-08-12 ENCOUNTER — Emergency Department (HOSPITAL_COMMUNITY): Payer: Medicare Other

## 2017-08-12 DIAGNOSIS — A419 Sepsis, unspecified organism: Secondary | ICD-10-CM

## 2017-08-12 DIAGNOSIS — Z90711 Acquired absence of uterus with remaining cervical stump: Secondary | ICD-10-CM | POA: Diagnosis not present

## 2017-08-12 DIAGNOSIS — R29898 Other symptoms and signs involving the musculoskeletal system: Secondary | ICD-10-CM

## 2017-08-12 DIAGNOSIS — N183 Chronic kidney disease, stage 3 (moderate): Secondary | ICD-10-CM | POA: Diagnosis not present

## 2017-08-12 DIAGNOSIS — Z96653 Presence of artificial knee joint, bilateral: Secondary | ICD-10-CM | POA: Diagnosis present

## 2017-08-12 DIAGNOSIS — E11649 Type 2 diabetes mellitus with hypoglycemia without coma: Secondary | ICD-10-CM | POA: Diagnosis not present

## 2017-08-12 DIAGNOSIS — N179 Acute kidney failure, unspecified: Secondary | ICD-10-CM | POA: Diagnosis present

## 2017-08-12 DIAGNOSIS — Z87891 Personal history of nicotine dependence: Secondary | ICD-10-CM

## 2017-08-12 DIAGNOSIS — Z888 Allergy status to other drugs, medicaments and biological substances status: Secondary | ICD-10-CM

## 2017-08-12 DIAGNOSIS — Z9049 Acquired absence of other specified parts of digestive tract: Secondary | ICD-10-CM | POA: Diagnosis not present

## 2017-08-12 DIAGNOSIS — W1830XA Fall on same level, unspecified, initial encounter: Secondary | ICD-10-CM | POA: Diagnosis present

## 2017-08-12 DIAGNOSIS — Z7952 Long term (current) use of systemic steroids: Secondary | ICD-10-CM

## 2017-08-12 DIAGNOSIS — L89312 Pressure ulcer of right buttock, stage 2: Secondary | ICD-10-CM | POA: Diagnosis present

## 2017-08-12 DIAGNOSIS — Z7982 Long term (current) use of aspirin: Secondary | ICD-10-CM

## 2017-08-12 DIAGNOSIS — A4151 Sepsis due to Escherichia coli [E. coli]: Principal | ICD-10-CM | POA: Diagnosis present

## 2017-08-12 DIAGNOSIS — Z8371 Family history of colonic polyps: Secondary | ICD-10-CM | POA: Diagnosis not present

## 2017-08-12 DIAGNOSIS — M48061 Spinal stenosis, lumbar region without neurogenic claudication: Secondary | ICD-10-CM | POA: Diagnosis present

## 2017-08-12 DIAGNOSIS — T380X5A Adverse effect of glucocorticoids and synthetic analogues, initial encounter: Secondary | ICD-10-CM | POA: Diagnosis not present

## 2017-08-12 DIAGNOSIS — G4733 Obstructive sleep apnea (adult) (pediatric): Secondary | ICD-10-CM | POA: Diagnosis present

## 2017-08-12 DIAGNOSIS — I10 Essential (primary) hypertension: Secondary | ICD-10-CM | POA: Diagnosis present

## 2017-08-12 DIAGNOSIS — Y92230 Patient room in hospital as the place of occurrence of the external cause: Secondary | ICD-10-CM | POA: Diagnosis not present

## 2017-08-12 DIAGNOSIS — Z993 Dependence on wheelchair: Secondary | ICD-10-CM | POA: Diagnosis not present

## 2017-08-12 DIAGNOSIS — Z8249 Family history of ischemic heart disease and other diseases of the circulatory system: Secondary | ICD-10-CM

## 2017-08-12 DIAGNOSIS — R531 Weakness: Secondary | ICD-10-CM | POA: Diagnosis not present

## 2017-08-12 DIAGNOSIS — G8929 Other chronic pain: Secondary | ICD-10-CM | POA: Diagnosis present

## 2017-08-12 DIAGNOSIS — E1122 Type 2 diabetes mellitus with diabetic chronic kidney disease: Secondary | ICD-10-CM | POA: Diagnosis not present

## 2017-08-12 DIAGNOSIS — E2740 Unspecified adrenocortical insufficiency: Secondary | ICD-10-CM | POA: Diagnosis present

## 2017-08-12 DIAGNOSIS — Z7401 Bed confinement status: Secondary | ICD-10-CM | POA: Diagnosis not present

## 2017-08-12 DIAGNOSIS — N390 Urinary tract infection, site not specified: Secondary | ICD-10-CM | POA: Diagnosis not present

## 2017-08-12 DIAGNOSIS — M545 Low back pain: Secondary | ICD-10-CM | POA: Diagnosis present

## 2017-08-12 DIAGNOSIS — Z800 Family history of malignant neoplasm of digestive organs: Secondary | ICD-10-CM

## 2017-08-12 DIAGNOSIS — A4150 Gram-negative sepsis, unspecified: Secondary | ICD-10-CM | POA: Diagnosis not present

## 2017-08-12 DIAGNOSIS — Z880 Allergy status to penicillin: Secondary | ICD-10-CM

## 2017-08-12 DIAGNOSIS — M6281 Muscle weakness (generalized): Secondary | ICD-10-CM | POA: Diagnosis not present

## 2017-08-12 DIAGNOSIS — Z91048 Other nonmedicinal substance allergy status: Secondary | ICD-10-CM

## 2017-08-12 DIAGNOSIS — J9811 Atelectasis: Secondary | ICD-10-CM | POA: Diagnosis not present

## 2017-08-12 DIAGNOSIS — I340 Nonrheumatic mitral (valve) insufficiency: Secondary | ICD-10-CM | POA: Diagnosis not present

## 2017-08-12 DIAGNOSIS — E108 Type 1 diabetes mellitus with unspecified complications: Secondary | ICD-10-CM | POA: Diagnosis not present

## 2017-08-12 DIAGNOSIS — E1165 Type 2 diabetes mellitus with hyperglycemia: Secondary | ICD-10-CM | POA: Diagnosis not present

## 2017-08-12 DIAGNOSIS — Z6841 Body Mass Index (BMI) 40.0 and over, adult: Secondary | ICD-10-CM | POA: Diagnosis not present

## 2017-08-12 DIAGNOSIS — E119 Type 2 diabetes mellitus without complications: Secondary | ICD-10-CM

## 2017-08-12 DIAGNOSIS — K219 Gastro-esophageal reflux disease without esophagitis: Secondary | ICD-10-CM | POA: Diagnosis present

## 2017-08-12 DIAGNOSIS — Z794 Long term (current) use of insulin: Secondary | ICD-10-CM

## 2017-08-12 DIAGNOSIS — L8990 Pressure ulcer of unspecified site, unspecified stage: Secondary | ICD-10-CM

## 2017-08-12 DIAGNOSIS — Z79899 Other long term (current) drug therapy: Secondary | ICD-10-CM

## 2017-08-12 DIAGNOSIS — R296 Repeated falls: Secondary | ICD-10-CM | POA: Diagnosis present

## 2017-08-12 DIAGNOSIS — R2689 Other abnormalities of gait and mobility: Secondary | ICD-10-CM | POA: Diagnosis not present

## 2017-08-12 DIAGNOSIS — R6520 Severe sepsis without septic shock: Secondary | ICD-10-CM | POA: Diagnosis not present

## 2017-08-12 DIAGNOSIS — E785 Hyperlipidemia, unspecified: Secondary | ICD-10-CM | POA: Diagnosis present

## 2017-08-12 DIAGNOSIS — R739 Hyperglycemia, unspecified: Secondary | ICD-10-CM | POA: Diagnosis not present

## 2017-08-12 DIAGNOSIS — Z833 Family history of diabetes mellitus: Secondary | ICD-10-CM

## 2017-08-12 DIAGNOSIS — R488 Other symbolic dysfunctions: Secondary | ICD-10-CM | POA: Diagnosis not present

## 2017-08-12 DIAGNOSIS — B9620 Unspecified Escherichia coli [E. coli] as the cause of diseases classified elsewhere: Secondary | ICD-10-CM | POA: Diagnosis present

## 2017-08-12 LAB — COMPREHENSIVE METABOLIC PANEL
ALBUMIN: 2.8 g/dL — AB (ref 3.5–5.0)
ALK PHOS: 74 U/L (ref 38–126)
ALT: 21 U/L (ref 14–54)
AST: 20 U/L (ref 15–41)
Anion gap: 13 (ref 5–15)
BUN: 27 mg/dL — AB (ref 6–20)
CALCIUM: 9.1 mg/dL (ref 8.9–10.3)
CO2: 27 mmol/L (ref 22–32)
Chloride: 99 mmol/L — ABNORMAL LOW (ref 101–111)
Creatinine, Ser: 2.18 mg/dL — ABNORMAL HIGH (ref 0.44–1.00)
GFR calc Af Amer: 26 mL/min — ABNORMAL LOW (ref >60)
GFR calc non Af Amer: 22 mL/min — ABNORMAL LOW (ref >60)
GLUCOSE: 319 mg/dL — AB (ref 65–99)
Potassium: 4.2 mmol/L (ref 3.5–5.1)
Sodium: 139 mmol/L (ref 135–145)
TOTAL PROTEIN: 6.3 g/dL — AB (ref 6.5–8.1)
Total Bilirubin: 0.5 mg/dL (ref 0.3–1.2)

## 2017-08-12 LAB — I-STAT CG4 LACTIC ACID, ED
LACTIC ACID, VENOUS: 1.86 mmol/L (ref 0.5–1.9)
Lactic Acid, Venous: 2.38 mmol/L (ref 0.5–1.9)

## 2017-08-12 LAB — CBC WITH DIFFERENTIAL/PLATELET
BASOS PCT: 0 %
Basophils Absolute: 0 10*3/uL (ref 0.0–0.1)
Eosinophils Absolute: 0 10*3/uL (ref 0.0–0.7)
Eosinophils Relative: 0 %
HEMATOCRIT: 38.4 % (ref 36.0–46.0)
HEMOGLOBIN: 12.1 g/dL (ref 12.0–15.0)
LYMPHS PCT: 8 %
Lymphs Abs: 1 10*3/uL (ref 0.7–4.0)
MCH: 28.9 pg (ref 26.0–34.0)
MCHC: 31.5 g/dL (ref 30.0–36.0)
MCV: 91.6 fL (ref 78.0–100.0)
MONOS PCT: 6 %
Monocytes Absolute: 0.8 10*3/uL (ref 0.1–1.0)
NEUTROS ABS: 10.9 10*3/uL — AB (ref 1.7–7.7)
NEUTROS PCT: 86 %
Platelets: 173 10*3/uL (ref 150–400)
RBC: 4.19 MIL/uL (ref 3.87–5.11)
RDW: 14.6 % (ref 11.5–15.5)
WBC: 12.7 10*3/uL — ABNORMAL HIGH (ref 4.0–10.5)

## 2017-08-12 LAB — URINALYSIS, ROUTINE W REFLEX MICROSCOPIC
Bilirubin Urine: NEGATIVE
Glucose, UA: 50 mg/dL — AB
Ketones, ur: NEGATIVE mg/dL
Nitrite: POSITIVE — AB
PH: 5 (ref 5.0–8.0)
Protein, ur: 30 mg/dL — AB
SPECIFIC GRAVITY, URINE: 1.015 (ref 1.005–1.030)

## 2017-08-12 MED ORDER — SODIUM CHLORIDE 0.9 % IV SOLN
1000.0000 mL | INTRAVENOUS | Status: DC
  Administered 2017-08-12 – 2017-08-13 (×2): 1000 mL via INTRAVENOUS

## 2017-08-12 MED ORDER — SODIUM CHLORIDE 0.9 % IV BOLUS (SEPSIS)
1000.0000 mL | Freq: Once | INTRAVENOUS | Status: AC
  Administered 2017-08-12: 1000 mL via INTRAVENOUS

## 2017-08-12 MED ORDER — ACETAMINOPHEN 500 MG PO TABS
1000.0000 mg | ORAL_TABLET | Freq: Once | ORAL | Status: AC
  Administered 2017-08-13: 1000 mg via ORAL
  Filled 2017-08-12: qty 2

## 2017-08-12 MED ORDER — CEFTRIAXONE SODIUM 1 G IJ SOLR
1.0000 g | Freq: Once | INTRAMUSCULAR | Status: AC
  Administered 2017-08-12: 1 g via INTRAVENOUS
  Filled 2017-08-12: qty 10

## 2017-08-12 NOTE — ED Notes (Signed)
Pt is refusing to have a I/O catheter done.

## 2017-08-12 NOTE — ED Notes (Signed)
Patient given water

## 2017-08-12 NOTE — ED Notes (Signed)
Patient is refusing IV attempts in bilateral arms without [place].

## 2017-08-12 NOTE — ED Notes (Signed)
Bed: [facility] Expected date:  Expected time:  Means of arrival:  Comments: No bed. 

## 2017-08-12 NOTE — Progress Notes (Signed)
Pt gives permission for her brother to remain in the room while questions are asked on the nursing admission hx. Lucius Conn BSN, RN-BC Admissions RN 08/12/2017 8:18 PM

## 2017-08-12 NOTE — H&P (Signed)
History and Physical    [name] FXT:[id number] DOB: [birth year] DOA: 08/12/2017  PCP: Lucianne Lei, MD Consultants:  Mina Marble - orthopedics Patient coming from: Home - lives alone, recently discharged from rehab; NOK: brother  Chief Complaint:  fall  HPI: Christy May is a 67 y.o. female with medical history significant of HTN; DM; morbid obesity (BMI 57.50); and depression presenting because she is unable to care for herself at home.  Patient went on Thanksgiving to [facility] with her brother and sister who live in [place].  When she went to come home, her legs gave out and were hurting.  They took her to the hospital in [place], [place].  Her daughter brought her home about a week later.  She went into rehab at [facility].  She caught a cold and bronchitis from a roommate.  She started falling and kept falling.  She fell on New Year's Day and several other times.  She was released from there about 2 weeks ago and she was supposed to have [place] [facility] coming to her house for 2 months.  She did not know she had to call them and so she was home for over a week by herself.  Eventually, she found out that the paperwork was incorrect.  She was unable to move in her house and so stayed in a lift chair.  [facility] sent [facility] to [facility] and they were willing to provide care for 2 weeks; she declined their service.  She tried to go back to [facility] and she was told that she would have to resubmit the paperwork to get approved.  She has been trying to get to a doctor's appointment and today was the 3rd missed appointment and so the paperwork wasn't done.  Neighbors have been checking in on her.  She has a hospital bed and wheelchair but still she's home by herself.  She is unable to get into her bed if she gets out.  Her brother called this AM and she didn't have any energy.  She reported inability to get her food last night.  He drove in from [place] to try to get her to a doctor's  appointment.  When he tried to get her into the vehicle, she collapsed to the floor.  They called 911 - she has been calling them about once a day to help her up out of the floor.  ED Course: Spinal stenosis, rehab but discharged.  Home health didn't show up.  Has been in lift chair and then bedridden, little help at home.  EMS - covered in urine, afebrile, tachycardia.  AKI.  +UTI.  Code sepsis, Rocephin.  Needs placement.  Review of Systems: As per HPI; otherwise review of systems reviewed and negative.   Ambulatory Status:  Non-ambulatory  Past Medical History:  Diagnosis Date  . Allergic bronchitis   . Allergy   . Anemia, unspecified   . Anxiety   . Arthritis    "neck, back, hands" (09/03/2014)  . Chronic airway obstruction, not elsewhere classified    "I was told I don't have this/tests done @ [facility] 05/2014"  . Chronic back pain   . Colon polyps   . Complication of anesthesia    "I've had recall; I probably stopped breathing at least 2 times" (09/03/2014)  . Dehydration   . Depressive disorder, not elsewhere classified   . Diabetic peripheral neuropathy ([place])   . Dysphagia 2007   historyof dysphagia with severe dysmotility by  barium swallow-[name]  . Esophageal reflux   . Extrinsic asthma, unspecified    "I was told I don't have this/tests done @ [facility] 05/2014"  . Family history of adverse reaction to anesthesia    "my brother was told they lost him a couple times during OR"  . Headache    "weekly to monthly" (09/03/2014)  . Heart murmur   . History of hiatal hernia   . Inflammatory and toxic neuropathy, unspecified   . Migraine    "couple times/month right now" (09/03/2014)  . Obesity, unspecified   . OSA (obstructive sleep apnea)     CPAP  . Spondylosis of unspecified site without mention of myelopathy   . Type II diabetes mellitus ([place])   . Unspecified essential hypertension   . Unspecified menopausal and postmenopausal disorder   . Unspecified vitamin D  deficiency     Past Surgical History:  Procedure Laterality Date  . ABDOMINAL HYSTERECTOMY     "partial"  . ANTERIOR CERVICAL DECOMP/DISCECTOMY FUSION     multiple cervical spine levels;Dr. Arnoldo Morale  . APPENDECTOMY    . BACK SURGERY    . CHOLECYSTECTOMY OPEN    . COLONOSCOPY N/A 05/08/2014   Procedure: COLONOSCOPY;  Surgeon: [name], MD;  Location: [facility] ENDOSCOPY;  Service: Endoscopy;  Laterality: N/A;  . DILATION AND CURETTAGE OF UTERUS    . ESOPHAGOGASTRODUODENOSCOPY N/A 05/08/2014   Procedure: ESOPHAGOGASTRODUODENOSCOPY (EGD);  Surgeon: [name], MD;  Location: [name];  Service: Endoscopy;  Laterality: N/A;  . EXPLORATORY LAPAROTOMY    . EYE SURGERY    . FOOT SURGERY Right X 2   "took blood out of my arms and put platelets in my feet"  . INCISION AND DRAINAGE ABSCESS     Chest  . JOINT REPLACEMENT    . REFRACTIVE SURGERY Bilateral   . TONSILLECTOMY    . TOTAL KNEE ARTHROPLASTY Left 10/2003  . TOTAL KNEE ARTHROPLASTY Right 01/2004   [name]    Social History   Socioeconomic History  . Marital status: Widowed    Spouse name: Not on file  . Number of children: 1  . Years of education: Not on file  . Highest education level: Not on file  Social Needs  . Financial resource strain: Not on file  . Food insecurity - worry: Not on file  . Food insecurity - inability: Not on file  . Transportation needs - medical: Not on file  . Transportation needs - non-medical: Not on file  Occupational History  . Occupation: disabled    Employer: DISABLED  Tobacco Use  . Smoking status: Former Smoker    Packs/day: 1.00    Years: 20.00    Pack years: 20.00    Types: Cigarettes    Last attempt to quit: 06/22/1986    Years since quitting: 31.1  . Smokeless tobacco: Never Used  Substance and Sexual Activity  . Alcohol use: Yes  . Drug use: No  . Sexual activity: Not on file  Other Topics Concern  . Not on file  Social History Narrative   ** Merged History Encounter  **       Patient does not get regular exercise.      Widowed 2004      Disabled    Allergies  Allergen Reactions  . Ace Inhibitors Anaphylaxis and Cough  . Penicillins Hives and Other (See Comments)    Has patient had a PCN reaction causing immediate rash, facial/tongue/throat swelling, SOB or lightheadedness  with hypotension:  No Has patient had a PCN reaction causing severe rash involving mucus membranes or skin necrosis:  No Has patient had a PCN reaction that required hospitalization No Has patient had a PCN reaction occurring within the last 10 years: No If all of the above answers are "NO", then may proceed with Cephalosporin use.  . Adhesive [Tape] Hives    Family History  Problem Relation Age of Onset  . Esophageal cancer Brother   . Esophageal cancer Sister        ?  . Colon polyps Brother   . Pancreatic cancer Sister        ?  . Diabetes Sister   . Diabetes Unknown        Aunt and Uncle  . Heart disease Maternal Grandfather     Prior to Admission medications   Medication Sig Start Date End Date Taking? Authorizing Provider  albuterol (PROVENTIL) (2.5 MG/3ML) 0.083% nebulizer solution Take 2.5 mg by nebulization every 6 (six) hours as needed for wheezing or shortness of breath.   Yes [provider]  allopurinol (ZYLOPRIM) 300 MG tablet Take 300 mg by mouth daily.   Yes [provider]  aspirin EC 81 MG tablet Take 81 mg by mouth at bedtime.   Yes [provider]  cholecalciferol (VITAMIN D) 1000 UNITS tablet Take 2,000 Units by mouth daily.    Yes [provider]  Cinnamon 500 MG capsule Take 1,000 mg by mouth daily.   Yes [provider]  Cyanocobalamin (VITAMIN B 12 PO) Take 500 mcg by mouth daily.   Yes [provider]  cyclobenzaprine (FLEXERIL) 10 MG tablet Take 10 mg by mouth every 8 (eight) hours as needed for muscle spasms. 08/02/17  Yes [provider]  cycloSPORINE (RESTASIS) 0.05 %  ophthalmic emulsion 1 drop 2 (two) times daily.   Yes [provider]  fluticasone (FLONASE) 50 MCG/ACT nasal spray SHAKE LIQUID AND USE 2 SPRAYS IN EACH NOSTRIL DAILY 03/08/17  Yes Byrum, Rose Fillers, MD  folic acid (FOLVITE) 1 MG tablet TAKE 4 TABLETS(4 MG) BY MOUTH DAILY 05/17/16  Yes Renato Shin, MD  gabapentin (NEURONTIN) 800 MG tablet Take 1 tablet (800 mg total) by mouth 3 (three) times daily. 02/27/15  Yes Biagio Borg, MD  HYDROcodone-acetaminophen (NORCO) 7.5-325 MG tablet Take 1 tablet by mouth 3 (three) times daily as needed. 08/08/17  Yes [provider]  insulin NPH Human (NOVOLIN N RELION) 100 UNIT/ML injection Inject 1.2 mLs (120 Units total) into the skin every morning. 03/09/17  Yes Renato Shin, MD  ipratropium (ATROVENT) 0.03 % nasal spray USE 2 SPRAYS IN EACH NOSTRIL THREE TIMES DAILY AS NEEDED FOR RHINITIS 03/17/17  Yes Collene Gobble, MD  irbesartan (AVAPRO) 300 MG tablet Take 300 mg by mouth daily.   Yes [provider]  loratadine (CLARITIN) 10 MG tablet Take 10 mg by mouth daily.   Yes [provider]  metoprolol (LOPRESSOR) 50 MG tablet Take 75 mg by mouth 2 (two) times daily.   Yes [provider]  montelukast (SINGULAIR) 10 MG tablet TAKE 1 TABLET(10 MG) BY MOUTH DAILY 04/03/16  Yes Magdalen Spatz, NP  NOVOLIN N RELION 100 UNIT/ML injection INJECT 190 UNITS SUBCUTANEOUSLY EVERY MORNING 04/13/17  Yes Renato Shin, MD  nystatin cream (MYCOSTATIN) Apply 1 application topically 2 (two) times daily as needed for dry skin.   Yes [provider]  oxybutynin (DITROPAN-XL) 10 MG 24 hr tablet Take 1  tablet (10 mg total) by mouth daily. 02/18/15  Yes Biagio Borg, MD  pantoprazole (PROTONIX) 40 MG tablet TAKE 1 TABLET(40 MG) BY MOUTH TWICE DAILY 03/02/17  Yes Collene Gobble, MD  predniSONE (DELTASONE) 20 MG tablet Take 1 tablet (20 mg total) by mouth daily with breakfast. 02/23/17  Yes Byrum, Rose Fillers, MD  ranitidine (ZANTAC) 300 MG  tablet TAKE 1 TABLET(300 MG) BY MOUTH AT BEDTIME 08/09/17  Yes Nandigam, Venia Minks, MD  sodium chloride (OCEAN) 0.65 % SOLN nasal spray Place 2 sprays into both nostrils 2 (two) times daily.   Yes [provider]  traMADol (ULTRAM) 50 MG tablet Take 2 tablets (100 mg total) by mouth every 6 (six) hours as needed. TAKE 1-2 TABLETS EVERY 4 HOURS AS NEED FOR COUGH OR PAIN 03/15/17  Yes Byrum, Rose Fillers, MD  Turmeric Curcumin 500 MG CAPS Take 1 capsule by mouth daily.   Yes [provider]  glucose blood (ONETOUCH VERIO) test strip Use as instructed to test twice daily 07/30/17   Renato Shin, MD  Insulin Syringe-Needle U-100 (INSULIN SYRINGE 1CC/31GX5/16") 31G X 5/16" 1 ML MISC USE TO INJECT INSULIN TWICE DAILY 05/11/17   Renato Shin, MD  ONE [facility] LANCETS MISC Use to check sugars twice daily 07/30/17   Renato Shin, MD    Physical Exam: Vitals:   08/12/17 2230 08/12/17 2300 08/12/17 2320 08/12/17 2330  BP: (!) 137/52 (!) 94/52  113/65  Pulse: (!) 140 (!) 141  (!) 138  Resp: (!) 25 (!) 22  20  Temp:   (!) 103 F (39.4 C)   TempSrc:   Oral   SpO2: 91% 92%  92%  Weight:         General: Patient appeared acutely ill and was extremely hot to touch; nurse was brought in and the patient's temp was 103 orally Eyes:  PERRL, EOMI, normal lids, iris; +injected conjunctiave ENT:  grossly normal hearing, lips & tongue, mmm Neck:  no LAD, masses or thyromegaly; no carotid bruits Cardiovascular:  Tachycardia, no m/r/g.  Respiratory:  CTA bilaterally with no wheezes/rales/rhonchi.  Normal respiratory effort with tachypnea. Abdomen: morbidly obese, soft, NT, ND, NABS Skin:  no rash or induration seen on limited exam; she had several grade 1 pressure ulcers on her upper thighs  Musculoskeletal:  grossly normal tone BUE/BLE, good ROM, no bony abnormality, 1+ edema Psychiatric:  Blunted mood and affect, speech slow but fluent and appropriate but somewhat repetitive, AOx3 Neurologic: CN 2-12  grossly intact, moves all extremities in coordinated fashion, sensation intact    Radiological Exams on Admission: Dg Chest Port 1 View  Result Date: 08/12/2017 CLINICAL DATA:  Chronic back pain. EXAM: PORTABLE CHEST 1 VIEW COMPARISON:  09/28/2016 FINDINGS: 1829 hours. Low volumes. Cardiopericardial silhouette is at upper limits of normal for size. Basilar atelectasis without pulmonary edema or substantial pleural effusion. The visualized bony structures of the thorax are intact. Telemetry leads overlie the chest. IMPRESSION: No active disease. Electronically Signed   By: Misty Stanley M.D.   On: 08/12/2017 19:16    EKG: Independently reviewed.  Sinus tachycardia with rate 127; nonspecific ST changes with no evidence of acute ischemia, likely rate-related changes   Labs on Admission: I have personally reviewed the available labs and imaging studies at the time of the admission.  Pertinent labs:   UA: turbid, 50 glucose, large Hgb, large LE, positive nitrite, 30 protein, many bacteria, TNTC RBC and WBC, 0-5 squamous cells Lactate 2.38, 1.86 Blood  cultures x 2 pending Glucose 319 BUN 27/Creatiine 2.18/GFR 26; prior 23/0.99/68 in 11/18 Albumin 2.8 WBC 12.7  Assessment/Plan Principal Problem:   Sepsis due to gram-negative UTI [facility] Active Problems:   Morbid obesity ([place])   Essential hypertension   Weakness generalized   Diabetes ([place])   Acute renal failure (ARF) (H[facility])   Sepsis from a urinary source -Elevated WBC count, fever, tachycardia with elevated lactate and borderline hypotension -While awaiting blood cultures, this appears to be a preseptic condition. -Sepsis protocol initiated -Suspect urinary source - she has vague complaints but has been lying in her urine at home and has a very abnormal UA -Blood and urine cultures pending -Will admit to SDU due to persistent hemodynamic instabliity and continue to monitor -Treat with IV Rocephin -Will trend lactate to ensure  improvement  HTN -Hold Avapro due to borderline hypotension -Continue Lopressor since she also has persistent tachycardia - but start tomorrow AM  DM -She takes extremely large doses of basal (NPH) insulin yet her glucose is still >300 -Will check A1c -Cover with resistant-scale SSI  Morbid obesity/weakness -Patient was sent home 2 weeks ago from rehab and has been failing -She is unable to move around her home and lives alone -She is unsafe for ongoing independent living -SW consult for placement  DVT prophylaxis: Lovenox  Code Status:  Full - confirmed with patient/family Family Communication: Brother present for much of the encounter  Disposition Plan:  Likely to need SNF once clinically improved Consults called: SW; PCCM through the sepsis order set  Admission status: Admit - It is my clinical opinion that admission to INPATIENT is reasonable and necessary because of the expectation that this patient will require hospital care that crosses at least 2 midnights to treat this condition based on the medical complexity of the problems presented.  Given the aforementioned information, the predictability of an adverse outcome is felt to be significant.     Karmen Bongo MD [facility]  If note is complete, please contact covering daytime or nighttime physician. [email or web address] Password [facility]  08/13/2017, 1:44 AM

## 2017-08-12 NOTE — ED Provider Notes (Addendum)
[facility] DEPT Provider Note  CSN: [id number] Arrival date & time: 08/12/17 1541  Chief Complaint(s) Weakness  HPI Christy May is a 67 y.o. female with an extensive past medical history listed below including hypertension, hyperlipidemia, diabetes, lumbar stenosis resulting in lower extremity weakness that required rehabilitation.  Patient was discharged from rehab 2 weeks ago and has been at home by herself but getting assistance from friends.  She has been chair or bedridden since her discharge from the skilled nursing facility.  She reports that she was supposed to have home health but there was an issue with the paperwork and then never showed up.  Patient reports that she had her hospital bed delivered 4 days ago.  Since then she was been bedridden.  Has not been able to get out to use the restroom.  Friends have been coming by to bring the food at least twice a day.  She states that she has been taking her medication.  Denies any fevers, chills, nausea, vomiting, diarrhea.  No chest pain or shortness of breath.  No abdominal pain.  No urinary symptoms.  She is endorsing chronic back pain associated with her lumbar stenosis. She denies urinary or bowel incontinence.  HPI  Past Medical History Past Medical History:  Diagnosis Date  . Allergic bronchitis   . Allergy   . Anemia, unspecified   . Anxiety   . Arthritis    "neck, back, hands" (09/03/2014)  . Chronic airway obstruction, not elsewhere classified    "I was told I don't have this/tests done @ [facility] 05/2014"  . Chronic back pain   . Colon polyps   . Complication of anesthesia    "I've had recall; I probably stopped breathing at least 2 times" (09/03/2014)  . Dehydration   . Depressive disorder, not elsewhere classified   . Diabetic peripheral neuropathy ([place])   . Dysphagia 2007   historyof dysphagia with severe dysmotility by barium swallow-[name]  . Esophageal reflux   .  Extrinsic asthma, unspecified    "I was told I don't have this/tests done @ [facility] 05/2014"  . Family history of adverse reaction to anesthesia    "my brother was told they lost him a couple times during OR"  . Headache    "weekly to monthly" (09/03/2014)  . Heart murmur   . History of hiatal hernia   . Hypertension   . Inflammatory and toxic neuropathy, unspecified   . Migraine    "couple times/month right now" (09/03/2014)  . Obesity, unspecified   . OSA (obstructive sleep apnea)     CPAP  . Spondylosis of unspecified site without mention of myelopathy   . Type II diabetes mellitus ([place])   . Unspecified essential hypertension   . Unspecified menopausal and postmenopausal disorder   . Unspecified vitamin D deficiency    Patient Active Problem List   Diagnosis Date Noted  . Upper airway cough syndrome 09/28/2016  . Morbid (severe) obesity due to excess calories ([place]) 11/11/2015  . Acute bronchitis 10/29/2015  . Diabetes ([place]) 08/21/2015  . Chest pain 07/14/2015  . Dyspnea on exertion 07/05/2015  . Obstructive sleep apnea 07/05/2015  . Constipation 03/05/2015  . Allergic rhinitis 03/05/2015  . Hearing loss 03/05/2015  . Vocal cord dysfunction 03/05/2015  . Pain in joint, shoulder region 09/11/2014  . Weakness generalized 09/02/2014  . Acute esophagitis 05/08/2014  . Benign neoplasm of sigmoid colon 05/08/2014  . Change in bowel habits 04/17/2014  . Fecal  incontinence 04/17/2014  . Urge incontinence 04/08/2014  . Cough 12/06/2013  . Chronic pain syndrome 08/24/2013  . Orthostasis 07/18/2013  . Acute kidney injury ([place]) 07/18/2013  . Bilateral carpal tunnel syndrome 02/14/2013  . Gout 01/04/2013  . Failure to thrive 06/28/2012  . Gait disorder 06/28/2012  . Preventative health care 08/23/2011  . Cervical radiculopathy 02/11/2011  . Low back pain 02/11/2011  . VITAMIN D DEFICIENCY 11/22/2009  . ANEMIA-NOS 11/22/2009  . DEGENERATIVE JOINT DISEASE, CERVICAL SPINE  11/22/2009  . Depression 09/16/2009  . POLYNEUROPATHY 09/16/2009  . Morbid obesity ([place]) 09/09/2009  . Essential hypertension 09/09/2009  . GERD 09/09/2009  . Sleep apnea 09/09/2009   Home Medication(s) Prior to Admission medications   Medication Sig Start Date End Date Taking? Authorizing Provider  albuterol (PROVENTIL) (2.5 MG/3ML) 0.083% nebulizer solution Take 2.5 mg by nebulization every 6 (six) hours as needed for wheezing or shortness of breath.   Yes [provider]  allopurinol (ZYLOPRIM) 300 MG tablet Take 300 mg by mouth daily.   Yes [provider]  aspirin EC 81 MG tablet Take 81 mg by mouth at bedtime.   Yes [provider]  cholecalciferol (VITAMIN D) 1000 UNITS tablet Take 2,000 Units by mouth daily.    Yes [provider]  Cinnamon 500 MG capsule Take 1,000 mg by mouth daily.   Yes [provider]  Cyanocobalamin (VITAMIN B 12 PO) Take 500 mcg by mouth daily.   Yes [provider]  cyclobenzaprine (FLEXERIL) 10 MG tablet Take 10 mg by mouth every 8 (eight) hours as needed for muscle spasms. 08/02/17  Yes [provider]  cycloSPORINE (RESTASIS) 0.05 % ophthalmic emulsion 1 drop 2 (two) times daily.   Yes [provider]  fluticasone (FLONASE) 50 MCG/ACT nasal spray SHAKE LIQUID AND USE 2 SPRAYS IN EACH NOSTRIL DAILY 03/08/17  Yes Byrum, Rose Fillers, MD  folic acid (FOLVITE) 1 MG tablet TAKE 4 TABLETS(4 MG) BY MOUTH DAILY 05/17/16  Yes Renato Shin, MD  gabapentin (NEURONTIN) 800 MG tablet Take 1 tablet (800 mg total) by mouth 3 (three) times daily. 02/27/15  Yes Biagio Borg, MD  HYDROcodone-acetaminophen (NORCO) 7.5-325 MG tablet Take 1 tablet by mouth 3 (three) times daily as needed. 08/08/17  Yes [provider]  insulin NPH Human (NOVOLIN N RELION) 100 UNIT/ML injection Inject 1.2 mLs (120 Units total) into the skin every morning. 03/09/17  Yes Renato Shin, MD  ipratropium (ATROVENT) 0.03 % nasal  spray USE 2 SPRAYS IN EACH NOSTRIL THREE TIMES DAILY AS NEEDED FOR RHINITIS 03/17/17  Yes Collene Gobble, MD  irbesartan (AVAPRO) 300 MG tablet Take 300 mg by mouth daily.   Yes [provider]  loratadine (CLARITIN) 10 MG tablet Take 10 mg by mouth daily.   Yes [provider]  metoprolol (LOPRESSOR) 50 MG tablet Take 75 mg by mouth 2 (two) times daily.   Yes [provider]  montelukast (SINGULAIR) 10 MG tablet TAKE 1 TABLET(10 MG) BY MOUTH DAILY 04/03/16  Yes Magdalen Spatz, NP  NOVOLIN N RELION 100 UNIT/ML injection INJECT 190 UNITS SUBCUTANEOUSLY EVERY MORNING 04/13/17  Yes Renato Shin, MD  nystatin cream (MYCOSTATIN) Apply 1 application topically 2 (two) times daily as needed for dry skin.   Yes [provider]  oxybutynin (DITROPAN-XL) 10 MG 24 hr tablet Take 1 tablet (10 mg total) by mouth daily. 02/18/15  Yes Biagio Borg, MD  pantoprazole (PROTONIX) 40 MG tablet TAKE 1 TABLET(40  MG) BY MOUTH TWICE DAILY 03/02/17  Yes Collene Gobble, MD  predniSONE (DELTASONE) 20 MG tablet Take 1 tablet (20 mg total) by mouth daily with breakfast. 02/23/17  Yes Byrum, Rose Fillers, MD  ranitidine (ZANTAC) 300 MG tablet TAKE 1 TABLET(300 MG) BY MOUTH AT BEDTIME 08/09/17  Yes Nandigam, Venia Minks, MD  sodium chloride (OCEAN) 0.65 % SOLN nasal spray Place 2 sprays into both nostrils 2 (two) times daily.   Yes [provider]  traMADol (ULTRAM) 50 MG tablet Take 2 tablets (100 mg total) by mouth every 6 (six) hours as needed. TAKE 1-2 TABLETS EVERY 4 HOURS AS NEED FOR COUGH OR PAIN 03/15/17  Yes Byrum, Rose Fillers, MD  Turmeric Curcumin 500 MG CAPS Take 1 capsule by mouth daily.   Yes [provider]  glucose blood (ONETOUCH VERIO) test strip Use as instructed to test twice daily 07/30/17   Renato Shin, MD  Insulin Syringe-Needle U-100 (INSULIN SYRINGE 1CC/31GX5/16") 31G X 5/16" 1 ML MISC USE TO INJECT INSULIN TWICE DAILY 05/11/17   Renato Shin, MD  ONE TOUCH LANCETS  MISC Use to check sugars twice daily 07/30/17   Renato Shin, MD                                                                                                                                    Past Surgical History Past Surgical History:  Procedure Laterality Date  . ABDOMINAL HYSTERECTOMY     "partial"  . ANTERIOR CERVICAL DECOMP/DISCECTOMY FUSION     multiple cervical spine levels;Dr. Arnoldo Morale  . APPENDECTOMY    . BACK SURGERY    . CHOLECYSTECTOMY OPEN    . COLONOSCOPY N/A 05/08/2014   Procedure: COLONOSCOPY;  Surgeon: [name], MD;  Location: [facility] ENDOSCOPY;  Service: Endoscopy;  Laterality: N/A;  . DILATION AND CURETTAGE OF UTERUS    . ESOPHAGOGASTRODUODENOSCOPY N/A 05/08/2014   Procedure: ESOPHAGOGASTRODUODENOSCOPY (EGD);  Surgeon: [name], MD;  Location: [name];  Service: Endoscopy;  Laterality: N/A;  . EXPLORATORY LAPAROTOMY    . EYE SURGERY    . FOOT SURGERY Right X 2   "took blood out of my arms and put platelets in my feet"  . INCISION AND DRAINAGE ABSCESS     Chest  . JOINT REPLACEMENT    . REFRACTIVE SURGERY Bilateral   . TONSILLECTOMY    . TOTAL KNEE ARTHROPLASTY Left 10/2003  . TOTAL KNEE ARTHROPLASTY Right 01/2004   [name]   Family History Family History  Problem Relation Age of Onset  . Esophageal cancer Brother   . Esophageal cancer Sister        ?  . Colon polyps Brother   . Pancreatic cancer Sister        ?  . Diabetes Sister   . Diabetes Unknown        Aunt and Uncle  . Heart disease Maternal Grandfather  Social History Social History   Tobacco Use  . Smoking status: Former Smoker    Packs/day: 1.00    Years: 20.00    Pack years: 20.00    Types: Cigarettes    Last attempt to quit: 06/22/1986    Years since quitting: 31.1  . Smokeless tobacco: Never Used  Substance Use Topics  . Alcohol use: Yes  . Drug use: No   Allergies Ace inhibitors; Penicillins; and Adhesive [tape]  Review of Systems Review of Systems All  other systems are reviewed and are negative for acute change except as noted in the HPI  Physical Exam Vital Signs  I have reviewed the triage vital signs BP (!) 91/55   Pulse (!) 129   Temp 98.1 F (36.7 C) (Oral)   Resp 19   Wt (!) 152 kg (335 lb)   SpO2 91%   BMI 57.50 kg/m   Physical Exam  Constitutional: She is oriented to person, place, and time. She appears well-developed and well-nourished. No distress.  Obese  HENT:  Head: Normocephalic and atraumatic.  Nose: Nose normal.  Eyes: Conjunctivae and EOM are normal. Pupils are equal, round, and reactive to light. Right eye exhibits no discharge. Left eye exhibits no discharge. No scleral icterus.  Neck: Normal range of motion. Neck supple.  Cardiovascular: Regular rhythm. Tachycardia present. Exam reveals no gallop and no friction rub.  No murmur heard. Pulmonary/Chest: Effort normal and breath sounds normal. No stridor. No respiratory distress. She has no rales.  Abdominal: Soft. She exhibits no distension. There is no tenderness.  Musculoskeletal: She exhibits no edema or tenderness.  1+ bilateral lower extremity edema  Neurological: She is alert and oriented to person, place, and time.  3/5 LLE. 1/5 RLE strength. Decreased RLE sensation. Intact LLE sensation. (has been like this for several months per patient)  Skin: Skin is warm and dry. No rash noted. She is not diaphoretic. No erythema.  Psychiatric: She has a normal mood and affect.  Vitals reviewed.   ED Results and Treatments Labs (all labs ordered are listed, but only abnormal results are displayed) Labs Reviewed  CBC WITH DIFFERENTIAL/PLATELET - Abnormal; Notable for the following components:      Result Value   WBC 12.7 (*)    Neutro Abs 10.9 (*)    All other components within normal limits  COMPREHENSIVE METABOLIC PANEL - Abnormal; Notable for the following components:   Chloride 99 (*)    Glucose, Bld 319 (*)    BUN 27 (*)    Creatinine, Ser 2.18 (*)     Total Protein 6.3 (*)    Albumin 2.8 (*)    GFR calc non Af Amer 22 (*)    GFR calc Af Amer 26 (*)    All other components within normal limits  URINALYSIS, ROUTINE W REFLEX MICROSCOPIC - Abnormal; Notable for the following components:   APPearance TURBID (*)    Glucose, UA 50 (*)    Hgb urine dipstick LARGE (*)    Protein, ur 30 (*)    Nitrite POSITIVE (*)    Leukocytes, UA LARGE (*)    Bacteria, UA MANY (*)    Squamous Epithelial / LPF 0-5 (*)    Non Squamous Epithelial 0-5 (*)    All other components within normal limits  I-STAT CG4 LACTIC ACID, ED - Abnormal; Notable for the following components:   Lactic Acid, Venous 2.38 (*)    All other components within normal limits  CULTURE, BLOOD (ROUTINE  X 2)  CULTURE, BLOOD (ROUTINE X 2)  I-STAT CG4 LACTIC ACID, ED                                                                                                                         EKG  EKG Interpretation  Date/Time:  Thursday August 12 2017 16:49:40 EST Ventricular Rate:  127 PR Interval:    QRS Duration: 68 QT Interval:  324 QTC Calculation: 471 R Axis:   6 Text Interpretation:  Sinus tachycardia Nonspecific T abnormalities, diffuse leads No significant change since last tracing Confirmed by Addison Lank [phone]) on 08/12/2017 7:38:58 PM      Radiology Dg Chest Port 1 View  Result Date: 08/12/2017 CLINICAL DATA:  Chronic back pain. EXAM: PORTABLE CHEST 1 VIEW COMPARISON:  09/28/2016 FINDINGS: 1829 hours. Low volumes. Cardiopericardial silhouette is at upper limits of normal for size. Basilar atelectasis without pulmonary edema or substantial pleural effusion. The visualized bony structures of the thorax are intact. Telemetry leads overlie the chest. IMPRESSION: No active disease. Electronically Signed   By: Misty Stanley M.D.   On: 08/12/2017 19:16   Pertinent labs & imaging results that were available during my care of the patient were reviewed by me and considered in  my medical decision making (see chart for details).  Medications Ordered in ED Medications  0.9 %  sodium chloride infusion (1,000 mLs Intravenous New Bag/Given 08/12/17 1838)  sodium chloride 0.9 % bolus 1,000 mL (not administered)  sodium chloride 0.9 % bolus 1,000 mL (0 mLs Intravenous Stopped 08/12/17 2100)  cefTRIAXone (ROCEPHIN) 1 g in sodium chloride 0.9 % 100 mL IVPB (0 g Intravenous Stopped 08/12/17 1919)                                                                                                                                    Procedures Procedures CRITICAL CARE Performed by: Grayce Sessions Chanti Golubski Total critical care time: 50 minutes Critical care time was exclusive of separately billable procedures and treating other patients. Critical care was necessary to treat or prevent imminent or life-threatening deterioration. Critical care was time spent personally by me on the following activities: development of treatment plan with patient and/or surrogate as well as nursing, discussions with consultants, evaluation of patient's response to treatment, examination of patient, obtaining history from patient or surrogate, ordering and performing treatments and interventions, ordering and review of laboratory  studies, ordering and review of radiographic studies, pulse oximetry and re-evaluation of patient's condition.   (including critical care time)  Medical Decision Making / ED Course I have reviewed the nursing notes for this encounter and the patient's prior records (if available in EHR or on provided paperwork).    Patient has been chair and bedridden for 2 weeks since being discharged from skilled nursing facility due to lower extremity weakness secondary to lumbar stenosis.  No bowel or bladder incontinence, or acute changes in her weakness to suggest cauda equina.  Patient is afebrile but tachycardic and has soft blood pressure.  Possible dehydration versus infection.  Septic labs  were ordered.  Patient noted to have elevated lactic acid.  Code sepsis was initiated and patient was given IV fluids and started on empiric antibiotics.  Labs notable for leukocytosis and acute renal injury.  Chest x-ray without evidence of pneumonia.  UA consistent with urinary tract infection.  Case was discussed with Dr. Lorin Mercy who will admit the patient for further management.  Final Clinical Impression(s) / ED Diagnoses Final diagnoses:  Sepsis secondary to UTI ([place])  AKI (acute kidney injury) ([place])  Weakness of both lower extremities      This chart was dictated using voice recognition software.  Despite best efforts to proofread,  errors can occur which can change the documentation meaning.     Fatima Blank, MD 08/12/17 2126

## 2017-08-12 NOTE — ED Notes (Signed)
Family at bedside. 

## 2017-08-12 NOTE — Clinical Social Work Note (Signed)
Clinical Social Work Assessment  Patient Details  Name: Christy May MRN: [id number] Date of Birth: 1950/10/17  Date of referral:  08/12/17               Reason for consult:  Facility Placement                Permission sought to share information with:  Chartered certified accountant granted to share information::  No  Name::        Agency::     Relationship::     Contact Information:     Housing/Transportation Living arrangements for the past 2 months:  Single Family Home Source of Information:  Patient, Friend/Neighbor Patient Interpreter Needed:  None Criminal Activity/Legal Involvement Pertinent to Current Situation/Hospitalization:    Significant Relationships:  Friend Lives with:  Self Do you feel safe going back to the place where you live?  No Need for family participation in patient care:  Yes (Comment)  Care giving concerns:  Per notes:Per EMS, patient from home, where patient reports she has been laying in her urine for days at a time. Reports she has no home health assistance. Patient is non ambulatory. Hx chronic back pain, sciatica, and bilateral knee replacements. Patient c/o generalized weakness. EMS spoke with patient's PCP, Dr. Criss Rosales, requesting assessment for placement. Patient recent d/c from rehab after back surgery.   CSW cautioned pt strongly against having false hope she will have authorized insurance days this month and in addition CSW attempted to explain custodial and further explained that since pt was D/C'd from [facility] after having been there for two months because pt could or would not participate in PT/OT, per pt's brother, pt may not be given authorized days due to this.  Ptargued stronlgy she had had recent fall while at [facility] but pt's brother stated pt refused due to pt's "belief the PT person was making her sick to her stomach and that is why she was refusing, it's all in her head".  CSW again cautioned pt in brother's  presence against having false hope she would be seen as appropriate by Medicare for further SNF days this month.  Pt presented a s confused when asked by the CSW if the CSW DEPT on medical floor could refer pt out with her permision now to facilities.  Although pt's brother encouraged pt to give permission pt presented as confused as stated, "I feel bombarded and presented as agitated".  CSW state pt can ask for her CSW on the floor when she felt like discussing D/C.   Social Worker assessment / plan:  CSW met with pt and confirmed pt's hope to be discharged to SNF for rehab.  CSW provided active listening and validated pt's concerns.   CSW DEPT WAS NOT GIVEN PERMISSION TO complete FL-2 and send referrals out to SNF facilities via the hub per pt's request.  Pt has been living independently prior to being admitted to [facility], BUT PCP recommended pt come to the ED to be assessed for placement, per pt.  Employment status:  Retired Forensic scientist:  Medicare PT Recommendations:  Not assessed at this time Information / Referral to community resources:     Patient/Family's Response to care:  Patient partly seemed alert and oriented.  Patient and pt's brother agreeable to speak to pt's assigned CSW..  Pt's brother supportive and strongly involved in pt.'s care.  Pt.'s brother was present but presented as weary of CSW's assistance, but recommended pt listen to the  CSW. Pt's brother was not unpleasant.    Patient/Family's Understanding of and Emotional Response to Diagnosis, Current Treatment, and Prognosis:  Pt and family understand current prognosis and treatment  Emotional Assessment Appearance:    Attitude/Demeanor/Rapport:    Affect (typically observed):  Apprehensive Orientation:  Fluctuating Orientation (Suspected and/or reported [place]) Alcohol / Substance use:    Psych involvement (Current and /or in the community):     Discharge Needs  Concerns to be addressed:  No discharge needs  identified Readmission within the last 30 days:  No Current discharge risk:  None Barriers to Discharge:  No Barriers Identified   Claudine Mouton, LCSWA 08/12/2017, 10:55 PM

## 2017-08-12 NOTE — Patient Outreach (Signed)
[facility]) Care Management  08/12/2017  MAYME PROFETA [birth year] [id number]   Patient was called to follow up post discharge. HIPAA identifiers were obtained.  Patient reported she was in the emergency room due to feeling weak. She said she was just arriving as I was calling her.  Plan:  Call patient tomorrow per protocol.  Elayne Guerin, PharmD, [place] Clinical Pharmacist [phone]

## 2017-08-12 NOTE — ED Notes (Signed)
Lactic given to RN and MD.   

## 2017-08-12 NOTE — ED Notes (Signed)
Patient refusing in and out cath at this time. MD made aware.

## 2017-08-12 NOTE — ED Notes (Signed)
XR at bedside

## 2017-08-12 NOTE — ED Triage Notes (Addendum)
Per EMS, patient from home, where patient reports she has been laying in her urine for days at a time. Reports she has no home health assistance. Patient is non ambulatory. Hx chronic back pain, sciatica, and bilateral knee replacements. Patient c/o generalized weakness. EMS spoke with patient's PCP, Dr. Criss Rosales, requesting assessment for placement. Patient recent d/c from rehab after back surgery.

## 2017-08-13 ENCOUNTER — Other Ambulatory Visit: Payer: Self-pay | Admitting: Pharmacist

## 2017-08-13 ENCOUNTER — Ambulatory Visit: Payer: Self-pay | Admitting: Endocrinology

## 2017-08-13 ENCOUNTER — Encounter (HOSPITAL_COMMUNITY): Payer: Self-pay | Admitting: Internal Medicine

## 2017-08-13 ENCOUNTER — Ambulatory Visit: Payer: Self-pay | Admitting: Pharmacist

## 2017-08-13 DIAGNOSIS — E1122 Type 2 diabetes mellitus with diabetic chronic kidney disease: Secondary | ICD-10-CM

## 2017-08-13 DIAGNOSIS — N179 Acute kidney failure, unspecified: Secondary | ICD-10-CM | POA: Diagnosis present

## 2017-08-13 DIAGNOSIS — N183 Chronic kidney disease, stage 3 (moderate): Secondary | ICD-10-CM

## 2017-08-13 DIAGNOSIS — Z794 Long term (current) use of insulin: Secondary | ICD-10-CM

## 2017-08-13 DIAGNOSIS — R531 Weakness: Secondary | ICD-10-CM

## 2017-08-13 DIAGNOSIS — L8990 Pressure ulcer of unspecified site, unspecified stage: Secondary | ICD-10-CM

## 2017-08-13 LAB — CBC
HEMATOCRIT: 38.4 % (ref 36.0–46.0)
Hemoglobin: 12.3 g/dL (ref 12.0–15.0)
MCH: 29.6 pg (ref 26.0–34.0)
MCHC: 32 g/dL (ref 30.0–36.0)
MCV: 92.3 fL (ref 78.0–100.0)
Platelets: 169 10*3/uL (ref 150–400)
RBC: 4.16 MIL/uL (ref 3.87–5.11)
RDW: 14.7 % (ref 11.5–15.5)
WBC: 11.5 10*3/uL — AB (ref 4.0–10.5)

## 2017-08-13 LAB — GLUCOSE, CAPILLARY
GLUCOSE-CAPILLARY: 275 mg/dL — AB (ref 65–99)
GLUCOSE-CAPILLARY: 366 mg/dL — AB (ref 65–99)
Glucose-Capillary: 293 mg/dL — ABNORMAL HIGH (ref 65–99)
Glucose-Capillary: 392 mg/dL — ABNORMAL HIGH (ref 65–99)
Glucose-Capillary: 308 mg/dL — ABNORMAL HIGH (ref 65–99)

## 2017-08-13 LAB — BLOOD CULTURE ID PANEL (REFLEXED)
Acinetobacter baumannii: NOT DETECTED
CANDIDA ALBICANS: NOT DETECTED
CANDIDA KRUSEI: NOT DETECTED
CANDIDA PARAPSILOSIS: NOT DETECTED
CANDIDA TROPICALIS: NOT DETECTED
CARBAPENEM RESISTANCE: NOT DETECTED
Candida glabrata: NOT DETECTED
ENTEROCOCCUS SPECIES: NOT DETECTED
Enterobacter cloacae complex: NOT DETECTED
Enterobacteriaceae species: NOT DETECTED
Escherichia coli: NOT DETECTED
Haemophilus influenzae: NOT DETECTED
KLEBSIELLA OXYTOCA: NOT DETECTED
KLEBSIELLA PNEUMONIAE: NOT DETECTED
Listeria monocytogenes: NOT DETECTED
Methicillin resistance: NOT DETECTED
Neisseria meningitidis: NOT DETECTED
PROTEUS SPECIES: NOT DETECTED
PSEUDOMONAS AERUGINOSA: NOT DETECTED
STAPHYLOCOCCUS AUREUS BCID: NOT DETECTED
STAPHYLOCOCCUS SPECIES: DETECTED — AB
STREPTOCOCCUS AGALACTIAE: NOT DETECTED
STREPTOCOCCUS PNEUMONIAE: NOT DETECTED
Serratia marcescens: NOT DETECTED
Streptococcus pyogenes: NOT DETECTED
Streptococcus species: NOT DETECTED
VANCOMYCIN RESISTANCE: NOT DETECTED

## 2017-08-13 LAB — LACTIC ACID, PLASMA
LACTIC ACID, VENOUS: 2.5 mmol/L — AB (ref 0.5–1.9)
Lactic Acid, Venous: 1.4 mmol/L (ref 0.5–1.9)

## 2017-08-13 LAB — MRSA PCR SCREENING: MRSA BY PCR: POSITIVE — AB

## 2017-08-13 LAB — BASIC METABOLIC PANEL
Anion gap: 13 (ref 5–15)
BUN: 24 mg/dL — AB (ref 6–20)
CHLORIDE: 102 mmol/L (ref 101–111)
CO2: 24 mmol/L (ref 22–32)
CREATININE: 1.59 mg/dL — AB (ref 0.44–1.00)
Calcium: 9 mg/dL (ref 8.9–10.3)
GFR calc Af Amer: 38 mL/min — ABNORMAL LOW (ref >60)
GFR, EST NON AFRICAN AMERICAN: 33 mL/min — AB (ref >60)
GLUCOSE: 279 mg/dL — AB (ref 65–99)
Potassium: 3.6 mmol/L (ref 3.5–5.1)
SODIUM: 139 mmol/L (ref 135–145)

## 2017-08-13 LAB — HEMOGLOBIN A1C
Hgb A1c MFr Bld: 9.9 % — ABNORMAL HIGH (ref 4.8–5.6)
Mean Plasma Glucose: 237.43 mg/dL

## 2017-08-13 LAB — PROCALCITONIN: PROCALCITONIN: 1.13 ng/mL

## 2017-08-13 MED ORDER — FLUTICASONE PROPIONATE 50 MCG/ACT NA SUSP
2.0000 | Freq: Every day | NASAL | Status: DC
  Administered 2017-08-13 – 2017-08-17 (×5): 2 via NASAL
  Filled 2017-08-13 (×2): qty 16

## 2017-08-13 MED ORDER — SODIUM CHLORIDE 0.9 % IV BOLUS (SEPSIS)
500.0000 mL | Freq: Once | INTRAVENOUS | Status: AC
  Administered 2017-08-13: 500 mL via INTRAVENOUS

## 2017-08-13 MED ORDER — LORATADINE 10 MG PO TABS
10.0000 mg | ORAL_TABLET | Freq: Every day | ORAL | Status: DC
  Administered 2017-08-13 – 2017-08-17 (×5): 10 mg via ORAL
  Filled 2017-08-13 (×5): qty 1

## 2017-08-13 MED ORDER — CYCLOBENZAPRINE HCL 10 MG PO TABS
10.0000 mg | ORAL_TABLET | Freq: Three times a day (TID) | ORAL | Status: DC | PRN
  Administered 2017-08-15: 10 mg via ORAL
  Filled 2017-08-13 (×2): qty 1

## 2017-08-13 MED ORDER — HYDROCORTISONE NA SUCCINATE PF 100 MG IJ SOLR
100.0000 mg | Freq: Three times a day (TID) | INTRAMUSCULAR | Status: DC
  Administered 2017-08-13 – 2017-08-15 (×6): 100 mg via INTRAVENOUS
  Filled 2017-08-13 (×6): qty 2

## 2017-08-13 MED ORDER — ONDANSETRON HCL 4 MG/2ML IJ SOLN
4.0000 mg | Freq: Four times a day (QID) | INTRAMUSCULAR | Status: DC | PRN
  Administered 2017-08-13: 4 mg via INTRAVENOUS
  Filled 2017-08-13: qty 2

## 2017-08-13 MED ORDER — ACETAMINOPHEN 650 MG RE SUPP
650.0000 mg | Freq: Four times a day (QID) | RECTAL | Status: DC | PRN
Start: 2017-08-13 — End: 2017-08-17

## 2017-08-13 MED ORDER — SODIUM CHLORIDE 0.9% FLUSH
10.0000 mL | Freq: Two times a day (BID) | INTRAVENOUS | Status: DC
  Administered 2017-08-13: 20 mL
  Administered 2017-08-13 – 2017-08-15 (×4): 10 mL

## 2017-08-13 MED ORDER — GABAPENTIN 400 MG PO CAPS
800.0000 mg | ORAL_CAPSULE | Freq: Three times a day (TID) | ORAL | Status: DC
  Administered 2017-08-13 – 2017-08-17 (×12): 800 mg via ORAL
  Filled 2017-08-13 (×13): qty 2

## 2017-08-13 MED ORDER — FOLIC ACID 1 MG PO TABS
4.0000 mg | ORAL_TABLET | Freq: Every day | ORAL | Status: DC
  Administered 2017-08-13 – 2017-08-17 (×4): 4 mg via ORAL
  Filled 2017-08-13 (×4): qty 4

## 2017-08-13 MED ORDER — METOPROLOL TARTRATE 25 MG PO TABS
25.0000 mg | ORAL_TABLET | Freq: Two times a day (BID) | ORAL | Status: DC
  Administered 2017-08-13 – 2017-08-17 (×8): 25 mg via ORAL
  Filled 2017-08-13 (×8): qty 1

## 2017-08-13 MED ORDER — ORAL CARE MOUTH RINSE
15.0000 mL | Freq: Two times a day (BID) | OROMUCOSAL | Status: DC
  Administered 2017-08-13 – 2017-08-17 (×6): 15 mL via OROMUCOSAL

## 2017-08-13 MED ORDER — SODIUM CHLORIDE 0.9 % IV BOLUS (SEPSIS)
250.0000 mL | Freq: Once | INTRAVENOUS | Status: AC
  Administered 2017-08-13: 250 mL via INTRAVENOUS

## 2017-08-13 MED ORDER — ACETAMINOPHEN 325 MG PO TABS
650.0000 mg | ORAL_TABLET | Freq: Four times a day (QID) | ORAL | Status: DC | PRN
Start: 2017-08-13 — End: 2017-08-17

## 2017-08-13 MED ORDER — TRAMADOL HCL 50 MG PO TABS
100.0000 mg | ORAL_TABLET | Freq: Four times a day (QID) | ORAL | Status: DC | PRN
  Administered 2017-08-14: 100 mg via ORAL
  Filled 2017-08-13: qty 2

## 2017-08-13 MED ORDER — ASPIRIN EC 81 MG PO TBEC
81.0000 mg | DELAYED_RELEASE_TABLET | Freq: Every day | ORAL | Status: DC
  Administered 2017-08-13 – 2017-08-16 (×5): 81 mg via ORAL
  Filled 2017-08-13 (×5): qty 1

## 2017-08-13 MED ORDER — PREDNISONE 20 MG PO TABS
20.0000 mg | ORAL_TABLET | Freq: Every day | ORAL | Status: DC
  Administered 2017-08-13: 20 mg via ORAL
  Filled 2017-08-13: qty 1

## 2017-08-13 MED ORDER — INSULIN NPH (HUMAN) (ISOPHANE) 100 UNIT/ML ~~LOC~~ SUSP
20.0000 [IU] | Freq: Two times a day (BID) | SUBCUTANEOUS | Status: DC
  Administered 2017-08-13: 20 [IU] via SUBCUTANEOUS

## 2017-08-13 MED ORDER — PANTOPRAZOLE SODIUM 40 MG PO TBEC
40.0000 mg | DELAYED_RELEASE_TABLET | Freq: Two times a day (BID) | ORAL | Status: DC
  Administered 2017-08-13 – 2017-08-17 (×8): 40 mg via ORAL
  Filled 2017-08-13 (×9): qty 1

## 2017-08-13 MED ORDER — INSULIN NPH (HUMAN) (ISOPHANE) 100 UNIT/ML ~~LOC~~ SUSP
20.0000 [IU] | Freq: Every day | SUBCUTANEOUS | Status: DC
  Filled 2017-08-13: qty 10

## 2017-08-13 MED ORDER — CHLORHEXIDINE GLUCONATE CLOTH 2 % EX PADS
6.0000 | MEDICATED_PAD | Freq: Every day | CUTANEOUS | Status: AC
  Administered 2017-08-13 – 2017-08-17 (×5): 6 via TOPICAL

## 2017-08-13 MED ORDER — DOCUSATE SODIUM 100 MG PO CAPS
100.0000 mg | ORAL_CAPSULE | Freq: Two times a day (BID) | ORAL | Status: DC
  Administered 2017-08-13 – 2017-08-17 (×5): 100 mg via ORAL
  Filled 2017-08-13 (×8): qty 1

## 2017-08-13 MED ORDER — SODIUM CHLORIDE 0.9 % IV SOLN
2.0000 g | INTRAVENOUS | Status: DC
  Administered 2017-08-13 – 2017-08-17 (×5): 2 g via INTRAVENOUS
  Filled 2017-08-13: qty 2
  Filled 2017-08-13 (×2): qty 20
  Filled 2017-08-13 (×2): qty 2
  Filled 2017-08-13: qty 20

## 2017-08-13 MED ORDER — MUPIROCIN 2 % EX OINT
1.0000 "application " | TOPICAL_OINTMENT | Freq: Two times a day (BID) | CUTANEOUS | Status: DC
  Administered 2017-08-13 – 2017-08-17 (×9): 1 via NASAL
  Filled 2017-08-13 (×3): qty 22

## 2017-08-13 MED ORDER — HYDROCODONE-ACETAMINOPHEN 7.5-325 MG PO TABS
1.0000 | ORAL_TABLET | Freq: Three times a day (TID) | ORAL | Status: DC | PRN
  Administered 2017-08-13 – 2017-08-17 (×8): 1 via ORAL
  Filled 2017-08-13 (×8): qty 1

## 2017-08-13 MED ORDER — FAMOTIDINE 20 MG PO TABS
40.0000 mg | ORAL_TABLET | Freq: Every day | ORAL | Status: DC
  Administered 2017-08-13 – 2017-08-15 (×3): 40 mg via ORAL
  Filled 2017-08-13 (×4): qty 2

## 2017-08-13 MED ORDER — ONDANSETRON HCL 4 MG PO TABS: 4.0000 mg | ORAL_TABLET | Freq: Four times a day (QID) | ORAL | Status: DC | PRN

## 2017-08-13 MED ORDER — INSULIN ASPART 100 UNIT/ML ~~LOC~~ SOLN
0.0000 [IU] | Freq: Every day | SUBCUTANEOUS | Status: DC
  Administered 2017-08-13: 4 [IU] via SUBCUTANEOUS
  Administered 2017-08-13: 3 [IU] via SUBCUTANEOUS
  Administered 2017-08-14: 2 [IU] via SUBCUTANEOUS
  Administered 2017-08-15: 3 [IU] via SUBCUTANEOUS

## 2017-08-13 MED ORDER — INSULIN ASPART 100 UNIT/ML ~~LOC~~ SOLN
0.0000 [IU] | Freq: Three times a day (TID) | SUBCUTANEOUS | Status: DC
  Administered 2017-08-13: 11 [IU] via SUBCUTANEOUS
  Administered 2017-08-13 (×2): 20 [IU] via SUBCUTANEOUS
  Administered 2017-08-14: 15 [IU] via SUBCUTANEOUS
  Administered 2017-08-14: 11 [IU] via SUBCUTANEOUS
  Administered 2017-08-14: 15 [IU] via SUBCUTANEOUS
  Administered 2017-08-15: 20 [IU] via SUBCUTANEOUS
  Administered 2017-08-15: 11 [IU] via SUBCUTANEOUS
  Administered 2017-08-15: 15 [IU] via SUBCUTANEOUS
  Administered 2017-08-16 (×2): 7 [IU] via SUBCUTANEOUS
  Administered 2017-08-16: 12:00:00 11 [IU] via SUBCUTANEOUS
  Administered 2017-08-17: 3 [IU] via SUBCUTANEOUS

## 2017-08-13 MED ORDER — CYCLOSPORINE 0.05 % OP EMUL
1.0000 [drp] | Freq: Two times a day (BID) | OPHTHALMIC | Status: DC
  Administered 2017-08-13 – 2017-08-17 (×9): 1 [drp] via OPHTHALMIC
  Filled 2017-08-13 (×11): qty 1

## 2017-08-13 MED ORDER — ENOXAPARIN SODIUM 80 MG/0.8ML ~~LOC~~ SOLN
75.0000 mg | Freq: Every day | SUBCUTANEOUS | Status: DC
  Administered 2017-08-13 – 2017-08-17 (×5): 75 mg via SUBCUTANEOUS
  Filled 2017-08-13 (×2): qty 0.75
  Filled 2017-08-13: qty 0.8
  Filled 2017-08-13 (×2): qty 0.75

## 2017-08-13 MED ORDER — MONTELUKAST SODIUM 10 MG PO TABS
10.0000 mg | ORAL_TABLET | Freq: Every day | ORAL | Status: DC
  Administered 2017-08-13 – 2017-08-15 (×3): 10 mg via ORAL
  Filled 2017-08-13 (×4): qty 1

## 2017-08-13 MED ORDER — ALBUTEROL SULFATE (2.5 MG/3ML) 0.083% IN NEBU: 2.5000 mg | INHALATION_SOLUTION | RESPIRATORY_TRACT | Status: DC | PRN

## 2017-08-13 MED ORDER — ENOXAPARIN SODIUM 40 MG/0.4ML ~~LOC~~ SOLN: 40.0000 mg | SUBCUTANEOUS | Status: DC

## 2017-08-13 MED ORDER — METOPROLOL TARTRATE 25 MG PO TABS
75.0000 mg | ORAL_TABLET | Freq: Two times a day (BID) | ORAL | Status: DC
  Filled 2017-08-13: qty 3

## 2017-08-13 MED ORDER — SODIUM CHLORIDE 0.9% FLUSH
3.0000 mL | Freq: Two times a day (BID) | INTRAVENOUS | Status: DC
  Administered 2017-08-13 – 2017-08-15 (×3): 3 mL via INTRAVENOUS

## 2017-08-13 MED ORDER — INSULIN NPH (HUMAN) (ISOPHANE) 100 UNIT/ML ~~LOC~~ SUSP
110.0000 [IU] | Freq: Every day | SUBCUTANEOUS | Status: DC
  Administered 2017-08-13: 110 [IU] via SUBCUTANEOUS
  Filled 2017-08-13 (×2): qty 10

## 2017-08-13 MED ORDER — SODIUM CHLORIDE 0.9% FLUSH: 10.0000 mL | INTRAVENOUS | Status: DC | PRN

## 2017-08-13 MED ORDER — ALLOPURINOL 300 MG PO TABS
300.0000 mg | ORAL_TABLET | Freq: Every day | ORAL | Status: DC
  Administered 2017-08-13 – 2017-08-17 (×5): 300 mg via ORAL
  Filled 2017-08-13: qty 1
  Filled 2017-08-13 (×2): qty 3
  Filled 2017-08-13: qty 1
  Filled 2017-08-13: qty 3

## 2017-08-13 MED ORDER — SODIUM CHLORIDE 0.9 % IV SOLN
1000.0000 mL | INTRAVENOUS | Status: DC
  Administered 2017-08-13: 1000 mL via INTRAVENOUS

## 2017-08-13 NOTE — Progress Notes (Signed)
PHARMACY NOTE:  ANTIMICROBIAL  DOSAGE ADJUSTMENT  Current antimicrobial regimen includes a mismatch between antimicrobial dosage and BMI.  As per policy approved by the Pharmacy & Therapeutics and Medical Executive Committees, the antimicrobial dosage will be adjusted accordingly.  Current antimicrobial dosage:  Rocephin 1 gm IV q24h  Indication: urine  Renal Function:     Antimicrobial dosage has been changed to:  Rocephin 2 Gm IV q24h  (BMI=57)     Thank you for allowing pharmacy to be a part of this patient's care.  Dorrene German, [facility] 08/13/2017 2:20 AM

## 2017-08-13 NOTE — ED Notes (Signed)
ED TO INPATIENT HANDOFF REPORT  Name/Age/Gender [name] 67 y.o. female  Code Status Code Status History    Date Active Date Inactive Code Status Order ID Comments User Context   07/14/2015 00:29 07/16/2015 22:16 Full Code [id number]  Gennaro Africa, MD ED   09/02/2014 09:19 09/05/2014 17:38 Full Code [id number]  Louellen Molder, MD Inpatient    Advance Directive Documentation     Most Recent Value  Type of Advance Directive  Healthcare Power of [place], Living will  Pre-existing out of facility DNR order (yellow form or pink MOST form)  No data  "MOST" Form in Place?  No data      Home/SNF/Other Home  Chief Complaint Failure to Thrive  Level of Care/Admitting Diagnosis ED Disposition    ED Disposition Condition Comment   Admit  Hospital Area: [place] [[id number]]  Level of Care: Stepdown [14]  Admit to SDU based on following criteria: Severe physiological/psychological symptoms:  Any diagnosis requiring assessment & intervention at least every 4 hours on an ongoing basis to obtain desired patient outcomes including stability and rehabilitation  Diagnosis: Sepsis due to gram-negative UTI [facility]) [[id number]]  Admitting Physician: Karmen Bongo [[id number]]  Attending Physician: Karmen Bongo [[id number]]  Estimated length of stay: 3 - 4 days  Certification:: I certify this patient will need inpatient services for at least 2 midnights  PT Class (Do Not Modify): Inpatient [101]  PT Acc Code (Do Not Modify): Private [1]       Medical History Past Medical History:  Diagnosis Date  . Allergic bronchitis   . Allergy   . Anemia, unspecified   . Anxiety   . Arthritis    "neck, back, hands" (09/03/2014)  . Chronic airway obstruction, not elsewhere classified    "I was told I don't have this/tests done @ [facility] 05/2014"  . Chronic back pain   . Colon polyps   . Complication of anesthesia    "I've had recall; I probably stopped breathing at least 2 times"  (09/03/2014)  . Dehydration   . Depressive disorder, not elsewhere classified   . Diabetic peripheral neuropathy ([place])   . Dysphagia 2007   historyof dysphagia with severe dysmotility by barium swallow-[name]  . Esophageal reflux   . Extrinsic asthma, unspecified    "I was told I don't have this/tests done @ [facility] 05/2014"  . Family history of adverse reaction to anesthesia    "my brother was told they lost him a couple times during OR"  . Headache    "weekly to monthly" (09/03/2014)  . Heart murmur   . History of hiatal hernia   . Hypertension   . Inflammatory and toxic neuropathy, unspecified   . Migraine    "couple times/month right now" (09/03/2014)  . Obesity, unspecified   . OSA (obstructive sleep apnea)     CPAP  . Spondylosis of unspecified site without mention of myelopathy   . Type II diabetes mellitus ([place])   . Unspecified essential hypertension   . Unspecified menopausal and postmenopausal disorder   . Unspecified vitamin D deficiency     Allergies Allergies  Allergen Reactions  . Ace Inhibitors Anaphylaxis and Cough  . Penicillins Hives and Other (See Comments)    Has patient had a PCN reaction causing immediate rash, facial/tongue/throat swelling, SOB or lightheadedness with hypotension:  No Has patient had a PCN reaction causing severe rash involving mucus membranes or skin necrosis:  No Has patient had a PCN reaction that required  hospitalization No Has patient had a PCN reaction occurring within the last 10 years: No If all of the above answers are "NO", then may proceed with Cephalosporin use.  . Adhesive [Tape] Hives    IV Location/Drains/Wounds Patient Lines/Drains/Airways Status   Active Line/Drains/Airways    Name:   Placement date:   Placement time:   Site:   Days:   Peripheral IV 07/13/15 Left Hand   07/13/15    2125    Hand   762          Labs/Imaging Results for orders placed or performed during the hospital encounter of 08/12/17  (from the past 48 hour(s))  CBC with Differential/Platelet     Status: Abnormal   Collection Time: 08/12/17  5:40 PM  Result Value Ref Range   WBC 12.7 (H) 4.0 - 10.5 K/uL   RBC 4.19 3.87 - 5.11 MIL/uL   Hemoglobin 12.1 12.0 - 15.0 g/dL   HCT 38.4 36.0 - 46.0 %   MCV 91.6 78.0 - 100.0 fL   MCH 28.9 26.0 - 34.0 pg   MCHC 31.5 30.0 - 36.0 g/dL   RDW 14.6 11.5 - 15.5 %   Platelets 173 150 - 400 K/uL   Neutrophils Relative % 86 %   Neutro Abs 10.9 (H) 1.7 - 7.7 K/uL   Lymphocytes Relative 8 %   Lymphs Abs 1.0 0.7 - 4.0 K/uL   Monocytes Relative 6 %   Monocytes Absolute 0.8 0.1 - 1.0 K/uL   Eosinophils Relative 0 %   Eosinophils Absolute 0.0 0.0 - 0.7 K/uL   Basophils Relative 0 %   Basophils Absolute 0.0 0.0 - 0.1 K/uL    Comment: Performed at [facility], [place] [address] [place], [place] [address]  Comprehensive metabolic panel     Status: Abnormal   Collection Time: 08/12/17  5:40 PM  Result Value Ref Range   Sodium 139 135 - 145 mmol/L   Potassium 4.2 3.5 - 5.1 mmol/L   Chloride 99 (L) 101 - 111 mmol/L   CO2 27 22 - 32 mmol/L   Glucose, Bld 319 (H) 65 - 99 mg/dL   BUN 27 (H) 6 - 20 mg/dL   Creatinine, Ser 2.18 (H) 0.44 - 1.00 mg/dL   Calcium 9.1 8.9 - 10.3 mg/dL   Total Protein 6.3 (L) 6.5 - 8.1 g/dL   Albumin 2.8 (L) 3.5 - 5.0 g/dL   AST 20 15 - 41 U/L   ALT 21 14 - 54 U/L   Alkaline Phosphatase 74 38 - 126 U/L   Total Bilirubin 0.5 0.3 - 1.2 mg/dL   GFR calc non Af Amer 22 (L) >60 mL/min   GFR calc Af Amer 26 (L) >60 mL/min    Comment: (NOTE) The eGFR has been calculated using the CKD EPI equation. This calculation has not been validated in all clinical situations. eGFR's persistently <60 mL/min signify possible Chronic Kidney Disease.    Anion gap 13 5 - 15    Comment: Performed at [facility], [place] [address] [place], [place] [address]  I-Stat CG4 Lactic Acid, ED     Status: Abnormal   Collection Time: 08/12/17  5:52 PM   Result Value Ref Range   Lactic Acid, Venous 2.38 (HH) 0.5 - 1.9 mmol/L   Comment NOTIFIED PHYSICIAN   I-Stat CG4 Lactic Acid, ED     Status: None   Collection Time: 08/12/17  6:47 PM  Result Value Ref Range  Lactic Acid, Venous 1.86 0.5 - 1.9 mmol/L  Urinalysis, Routine w reflex microscopic     Status: Abnormal   Collection Time: 08/12/17  7:30 PM  Result Value Ref Range   Color, Urine YELLOW YELLOW   APPearance TURBID (A) CLEAR   Specific Gravity, Urine 1.015 1.005 - 1.030   pH 5.0 5.0 - 8.0   Glucose, UA 50 (A) NEGATIVE mg/dL   Hgb urine dipstick LARGE (A) NEGATIVE   Bilirubin Urine NEGATIVE NEGATIVE   Ketones, ur NEGATIVE NEGATIVE mg/dL   Protein, ur 30 (A) NEGATIVE mg/dL   Nitrite POSITIVE (A) NEGATIVE   Leukocytes, UA LARGE (A) NEGATIVE   RBC / HPF TOO NUMEROUS TO COUNT 0 - 5 RBC/hpf   WBC, UA TOO NUMEROUS TO COUNT 0 - 5 WBC/hpf   Bacteria, UA MANY (A) NONE SEEN   Squamous Epithelial / LPF 0-5 (A) NONE SEEN   Non Squamous Epithelial 0-5 (A) NONE SEEN    Comment: Performed at [place], [place] [address] [place], [place] [address]   Dg Chest Port 1 View  Result Date: 08/12/2017 CLINICAL DATA:  Chronic back pain. EXAM: PORTABLE CHEST 1 VIEW COMPARISON:  09/28/2016 FINDINGS: 1829 hours. Low volumes. Cardiopericardial silhouette is at upper limits of normal for size. Basilar atelectasis without pulmonary edema or substantial pleural effusion. The visualized bony structures of the thorax are intact. Telemetry leads overlie the chest. IMPRESSION: No active disease. Electronically Signed   By: Misty Stanley M.D.   On: 08/12/2017 19:16    Pending Labs Unresulted Labs (From admission, onward)   Start     Ordered   08/12/17 1823  Blood Culture (routine x 2)  BLOOD CULTURE X 2,   STAT     08/12/17 1823   Signed and Held  Lactic acid, plasma  STAT Now then every 3 hours,   STAT     Signed and Held   Signed and Held  Procalcitonin  STAT,   R     Signed and  Held   Signed and Held  Creatinine, serum  (enoxaparin (LOVENOX)    CrCl >/= 30 ml/min)  Weekly,   R    Comments:  while on enoxaparin therapy    Signed and Held   Signed and Held  Basic metabolic panel  Tomorrow morning,   R     Signed and Held   Signed and Held  CBC  Tomorrow morning,   R     Signed and Held   Signed and Held  Hemoglobin A1c  Once,   R    Comments:  To assess prior glycemic control    Signed and Held      Vitals/Pain Today's Vitals   08/12/17 2153 08/12/17 2230 08/12/17 2300 08/12/17 2320  BP: (!) 124/36 (!) 137/52 (!) 94/52   Pulse: (!) 139 (!) 140 (!) 141   Resp: (!) 23 (!) 25 (!) 22   Temp:    (!) 103 F (39.4 C)  TempSrc:    Oral  SpO2: 91% 91% 92%   Weight:      PainSc:        Isolation Precautions No active isolations  Medications Medications  0.9 %  sodium chloride infusion (1,000 mLs Intravenous New Bag/Given 08/12/17 1838)  acetaminophen (TYLENOL) tablet 1,000 mg (not administered)  sodium chloride 0.9 % bolus 1,000 mL (0 mLs Intravenous Stopped 08/12/17 2100)  cefTRIAXone (ROCEPHIN) 1 g in sodium chloride 0.9 % 100 mL IVPB (0 g Intravenous  Stopped 08/12/17 1919)  sodium chloride 0.9 % bolus 1,000 mL (1,000 mLs Intravenous New Bag/Given 08/12/17 2144)    Mobility non-ambulatory

## 2017-08-13 NOTE — Consult Note (Signed)
   [facility] CM Inpatient Consult   08/13/2017  TAKIYA BELMARES May 02, 1951 [id number]    Made aware by [facility] of patient's hospitalization. Mrs. Celia is active with [place] Management program. Please see chart review tab then encounters for further detailed patient outreach details.  Mrs. Crisp is followed by multiple disciplines with [facility] RNCM, [facility] Pharmacist.  Mrs. Pepitone was recently discharged from [facility]. It was thought patient was active with [facility] prior to hospitalization. However, confirmed with Santiago Glad, [facility] hospital liaison, that Mrs. Younts was not opened to [facility] due to having another home health agency involved. Mrs. Peoples apparently declined [facility] home health. Confirmed with Tommi Rumps with [facility] as well that patient is not active with [facility] because she refused their services.  Therefore, it does not appear Mrs. Hegel is active with any home health currently.   Spoke with covering inpatient RNCM for [facility] stepdown to make aware of all of the above notes.   Will continue to follow.    Marthenia Rolling, MSN-Ed, RN,BSN [facility] Liaison [phone]

## 2017-08-13 NOTE — Care Management Note (Signed)
Case Management Note  Patient Details  Name: [name] MRN: [id number] Date of Birth: [birth year]  Subjective/Objective:  67 y/o f admitted w/Sepsis. From home. Active w/[facility]. Prior to [facility] received referral but did not admit, [facility] also received referral-they also did not admit-patient declined services. PT cons-await recc.                  Action/Plan:d/c plan home.   Expected Discharge Date:  (unknown)               Expected Discharge Plan:  [place]  In-House Referral:     Discharge planning Services  CM Consult  Post Acute Care Choice:    Choice offered to:     DME Arranged:    DME Agency:     HH Arranged:    HH Agency:     Status of Service:  In process, will continue to follow  If discussed at Long Length of Stay Meetings, dates discussed:    Additional Comments:  Dessa Phi, RN 08/13/2017, 10:55 AM

## 2017-08-13 NOTE — Progress Notes (Signed)
PHARMACY - PHYSICIAN COMMUNICATION CRITICAL VALUE ALERT - BLOOD CULTURE IDENTIFICATION (BCID)  Christy May is an 67 y.o. female who presented to [facility] on 08/12/2017 with a chief complaint of a fall.  Assessment:  Sepsis likely urinary source (include suspected source if known)  Name of physician (or Provider) Contacted: Bodenheimer  Current antibiotics: Rocephin 2g q24h  Changes to prescribed antibiotics recommended: likely contaminant, no treatment at this time Patient is on recommended antibiotics - No changes needed  Results for orders placed or performed during the hospital encounter of 08/12/17  Blood Culture ID Panel (Reflexed) (Collected: 08/12/2017  6:23 PM)  Result Value Ref Range   Enterococcus species NOT DETECTED NOT DETECTED   Vancomycin resistance NOT DETECTED NOT DETECTED   Listeria monocytogenes NOT DETECTED NOT DETECTED   Staphylococcus species DETECTED (A) NOT DETECTED   Staphylococcus aureus NOT DETECTED NOT DETECTED   Methicillin resistance NOT DETECTED NOT DETECTED   Streptococcus species NOT DETECTED NOT DETECTED   Streptococcus agalactiae NOT DETECTED NOT DETECTED   Streptococcus pneumoniae NOT DETECTED NOT DETECTED   Streptococcus pyogenes NOT DETECTED NOT DETECTED   Acinetobacter baumannii NOT DETECTED NOT DETECTED   Enterobacteriaceae species NOT DETECTED NOT DETECTED   Enterobacter cloacae complex NOT DETECTED NOT DETECTED   Escherichia coli NOT DETECTED NOT DETECTED   Klebsiella oxytoca NOT DETECTED NOT DETECTED   Klebsiella pneumoniae NOT DETECTED NOT DETECTED   Proteus species NOT DETECTED NOT DETECTED   Serratia marcescens NOT DETECTED NOT DETECTED   Carbapenem resistance NOT DETECTED NOT DETECTED   Haemophilus influenzae NOT DETECTED NOT DETECTED   Neisseria meningitidis NOT DETECTED NOT DETECTED   Pseudomonas aeruginosa NOT DETECTED NOT DETECTED   Candida albicans NOT DETECTED NOT DETECTED   Candida glabrata NOT DETECTED NOT DETECTED    Candida krusei NOT DETECTED NOT DETECTED   Candida parapsilosis NOT DETECTED NOT DETECTED   Candida tropicalis NOT DETECTED NOT DETECTED    Peggyann Juba, PharmD, BCPS Pager: [phone] 08/13/2017  7:11 PM

## 2017-08-13 NOTE — Progress Notes (Addendum)
PROGRESS NOTE    Christy May  QJF:[id number] DOB: 12/07/1950 DOA: 08/12/2017 PCP: Lucianne Lei, MD    Brief Narrative:  67 year old female presented after a fall.  She does have the significant past medical history of hypertension, type 2 diabetes mellitus, morbid obesity and depression. Patient had a significant decline in her functional physical capacity over the last 3 months. Multiple falls at home, generalized weakness, was not able to follow-up with her primary care physician. The day of admission she collapsed on the floor while trying to get into a vehicle, she was brought to the hospital for further evaluation. On initial physical examination blood pressure 137/52, 94/52, heart rate 140, respiratory 25, temperature 100.3, oxygen saturation 91%. Heart S1-S2 present tachycardic, lungs were clear to auscultation bilaterally, no wheezing, rales or rhonchi, abdomen was protuberant but nontender, no lower extremity edema, neurologically nonfocal. Sodium 139, potassium 4.2, chloride 99, bicarbonate 27, glucose 319, BUN 27, creatinine 2.18, calcium 9.1,(venous) lactic acid 2.3, white count 12.7, hemoglobin 12.1, hematocrit 173, urinalysis 30 protein, specific gravity 1.015, too numerous to count RBCs, too numerous to count white blood cells. Chest x-ray was hypoinflated, no infiltrates. EKG sinus rhythm, 127 bpm, normal axis and normal intervals.  Patient was admitted to the hospital due to sepsis related to urinary tract infection, complicated by acute kidney injury.    Assessment & Plan:   Principal Problem:   Sepsis due to gram-negative UTI [facility]) Active Problems:   Morbid obesity ([place])   Essential hypertension   Weakness generalized   Diabetes ([place])   Acute renal failure (ARF) (HCC)   Pressure injury of skin   1. Sepsis due to urinary tract infection. Will continue antibiotic therapy with IV ceftriaxone will keep MAP at 65, maintenance IV fluids at 100 ml per hour and will give  bolus as needed. Check echocardiogram. If persistent hypotension may need vasopressors. WBC at 11.5. Will resume metoprolol as tolerated, will decrease dose to 25 mg, patient will benefit from decreased heart rate.   2. AKI. Will continue IV fluids with normal saline at 100 ml per hour, serum cr down to 1,59, from 2,18, will continue to follow on renal panel and electrolytes, avoid hypotension or nephrotoxic medications.   3. Type 2 diabetes mellitus. Patient on 100 units at home, currently critically ill with decreases po intake, will decreased base insulin to 20 units bid and will continue insulin sliding scale, avoid hypotension.   4. Morbid obesity with ambulatory dysfunction. Will continue supportive medical therapy, oxymetry monitoring and supplemental 02 per [place]. Avoid volume overload.    DVT prophylaxis: enoxaparin   Code Status:  full Family Communication: no family at the bedside  Disposition Plan: home   Consultants:     Procedures:     Antimicrobials:   Vancomycin   Aztreonam    Subjective: Patient very weak and deconditioned, complains of back pain 8-9/10, lower back with no improvement or worsening factors, no radiation. She recently had wheelchair prescribed.   Objective: Vitals:   08/12/17 2320 08/12/17 2330 08/13/17 0000 08/13/17 0400  BP:  113/65 112/66 (!) 113/48  Pulse:  (!) 138 (!) 138 (!) 145  Resp:  20 18 (!) 31  Temp: (!) 103 F (39.4 C)   (!) 101.2 F (38.4 C)  TempSrc: Oral   Oral  SpO2:  92% 90% 95%  Weight:        Intake/Output Summary (Last 24 hours) at 08/13/2017 0756 Last data filed at 08/13/2017 0600 Gross per 24  hour  Intake 875 ml  Output 300 ml  Net 575 ml   Filed Weights   08/12/17 1829  Weight: (!) 152 kg (335 lb)    Examination:   General: deconditioned and ill looking appearing.  Neurology: Awake and alert, non focal  E ENT: mild pallor, no icterus, oral mucosa moist Cardiovascular: No JVD. Distant S1-S2 present,  rhythmic, no gallops, rubs, or murmurs. Non pitting ++ lower extremity edema. Pulmonary: decreased breath sounds bilaterally at bases, adequate air movement, no wheezing, rhonchi or rales. Gastrointestinal. Abdomen protuberant, no organomegaly, non tender, no rebound or guarding Skin. No rashes Musculoskeletal: no joint deformities     Data Reviewed: I have personally reviewed following labs and imaging studies  CBC: Recent Labs  Lab 08/12/17 1740 08/13/17 0429  WBC 12.7* 11.5*  NEUTROABS 10.9*  --   HGB 12.1 12.3  HCT 38.4 38.4  MCV 91.6 92.3  PLT 173 390   Basic Metabolic Panel: Recent Labs  Lab 08/12/17 1740 08/13/17 0429  NA 139 139  K 4.2 3.6  CL 99* 102  CO2 27 24  GLUCOSE 319* 279*  BUN 27* 24*  CREATININE 2.18* 1.59*  CALCIUM 9.1 9.0   GFR: Estimated Creatinine Clearance: 50.7 mL/min (A) (by C-G formula based on SCr of 1.59 mg/dL (H)). Liver Function Tests: Recent Labs  Lab 08/12/17 1740  AST 20  ALT 21  ALKPHOS 74  BILITOT 0.5  PROT 6.3*  ALBUMIN 2.8*   No results for input(s): LIPASE, AMYLASE in the last 168 hours. No results for input(s): AMMONIA in the last 168 hours. Coagulation Profile: No results for input(s): INR, PROTIME in the last 168 hours. Cardiac Enzymes: No results for input(s): CKTOTAL, CKMB, CKMBINDEX, TROPONINI in the last 168 hours. BNP (last 3 results) No results for input(s): PROBNP in the last 8760 hours. HbA1C: No results for input(s): HGBA1C in the last 72 hours. CBG: Recent Labs  Lab 08/13/17 0256 08/13/17 0740  GLUCAP 293* 275*   Lipid Profile: No results for input(s): CHOL, HDL, LDLCALC, TRIG, CHOLHDL, LDLDIRECT in the last 72 hours. Thyroid Function Tests: No results for input(s): TSH, T4TOTAL, FREET4, T3FREE, THYROIDAB in the last 72 hours. Anemia Panel: No results for input(s): VITAMINB12, FOLATE, FERRITIN, TIBC, IRON, RETICCTPCT in the last 72 hours.    Radiology Studies: I have reviewed all of the  imaging during this hospital visit personally     Scheduled Meds: . allopurinol  300 mg Oral Daily  . aspirin EC  81 mg Oral QHS  . Chlorhexidine Gluconate Cloth  6 each Topical Q0600  . cycloSPORINE  1 drop Both Eyes BID  . docusate sodium  100 mg Oral BID  . enoxaparin (LOVENOX) injection  75 mg Subcutaneous Daily  . famotidine  40 mg Oral QHS  . fluticasone  2 spray Each Nare Daily  . folic acid  4 mg Oral Daily  . gabapentin  800 mg Oral TID  . insulin aspart  0-20 Units Subcutaneous TID WC  . insulin aspart  0-5 Units Subcutaneous QHS  . insulin NPH Human  110 Units Subcutaneous QAC breakfast  . loratadine  10 mg Oral Daily  . metoprolol tartrate  75 mg Oral BID  . montelukast  10 mg Oral QHS  . mupirocin ointment  1 application Nasal BID  . pantoprazole  40 mg Oral BID  . predniSONE  20 mg Oral Q breakfast  . sodium chloride flush  3 mL Intravenous Q12H   Continuous Infusions: .  sodium chloride 1,000 mL (08/13/17 0420)  . cefTRIAXone (ROCEPHIN)  IV       LOS: 1 day        Tawni Millers, MD [facility] Pager [phone]

## 2017-08-13 NOTE — Progress Notes (Signed)
Patient systolic BP high 82M, low 90s. MAP less than 65. Will hold metoprolol.  MD paged/notified. New orders received for 250 mL NS bolus.  Will continue to monitor.

## 2017-08-13 NOTE — Progress Notes (Signed)
CRITICAL VALUE ALERT  Critical Value: Lactic Acid 2.5  Date & Time Notied:  08/13/17 @0240   Provider Notified: MD Bodenheimer text-paged  Orders Received/Actions taken: Awaiting response. Will continue to monitor

## 2017-08-13 NOTE — Addendum Note (Signed)
Addended byValente David on: 08/13/2017 09:14 AM   Modules accepted: Orders

## 2017-08-13 NOTE — Patient Outreach (Signed)
[facility]) Care Management  08/13/2017  Christy May 1951/06/19 [id number]   Reviewed patient's chart prior to calling her. Unfortunately, she is currently hospitalized for sepsis with an unknown discharge date.  Plan: Attempt to get in touch with the patient upon discharge. The most recent hospital notes state she should be discharged home with home health.  Elayne Guerin, PharmD, [place] Clinical Pharmacist [phone]

## 2017-08-13 NOTE — Progress Notes (Signed)
Rx Brief note: Lovenox  Wt=152 kg, BMI=57, CrCl~37 ml/min  Rx adjusted Lovenox to 75 mg daily (~0.5 mg/kg) in pt with BMI>30  Thanks Dorrene German 08/13/2017 1:47 AM

## 2017-08-14 ENCOUNTER — Inpatient Hospital Stay (HOSPITAL_COMMUNITY): Payer: Medicare Other

## 2017-08-14 DIAGNOSIS — A419 Sepsis, unspecified organism: Secondary | ICD-10-CM

## 2017-08-14 DIAGNOSIS — I340 Nonrheumatic mitral (valve) insufficiency: Secondary | ICD-10-CM

## 2017-08-14 LAB — ECHOCARDIOGRAM COMPLETE
AO mean calculated velocity dopler: 179 cm/s
AV Area VTI: 1.3 cm2
AV area mean vel ind: 0.55 cm2/m2
AV peak Index: 0.53
AV vel: 1.51
AVA: 1.51 cm2
AVAREAMEANV: 1.34 cm2
AVAREAVTIIND: 0.62 cm2/m2
AVCELMEANRAT: 0.43
AVG: 15 mmHg
AVPG: 24 mmHg
AVPKVEL: 244 cm/s
Ao pk vel: 0.41 m/s
CHL CUP AV VALUE AREA INDEX: 0.62
CHL CUP STROKE VOLUME: 50 mL
CHL CUP TV REG PEAK VELOCITY: 309 cm/s
EERAT: 10.09
EWDT: 243 ms
FS: 32 % (ref 28–44)
IVS/LV PW RATIO, ED: 1.14
LA ID, A-P, ES: 30 mm
LA diam end sys: 30 mm
LA diam index: 1.23 cm/m2
LA vol A4C: 24.8 ml
LA vol index: 13.6 mL/m2
LA vol: 33.3 mL
LV E/e' medial: 10.09
LV E/e'average: 10.09
LV PW d: 13.3 mm — AB (ref 0.6–1.1)
LV SIMPSON'S DISK: 65
LV TDI E'MEDIAL: 9.36
LV e' LATERAL: 9.14 cm/s
LV sys vol index: 11 mL/m2
LV sys vol: 27 mL (ref 14–42)
LVDIAVOL: 78 mL (ref 46–106)
LVDIAVOLIN: 32 mL/m2
LVOT MV VTI INDEX: 1.08 cm2/m2
LVOT MV VTI: 2.63
LVOT VTI: 27.6 cm
LVOT area: 3.14 cm2
LVOT peak VTI: 0.48 cm
LVOT peak vel: 101 cm/s
LVOTD: 20 mm
LVOTSV: 87 mL
MV Annulus VTI: 32.9 cm
MV Dec: 243
MV M vel: 83.7
MV Peak grad: 3 mmHg
MV pk A vel: 101 m/s
MV pk E vel: 92.2 m/s
Mean grad: 4 mmHg
P 1/2 time: 278 ms
RV LATERAL S' VELOCITY: 15.7 cm/s
RV TAPSE: 24.4 mm
TDI e' lateral: 9.14
TR max vel: 309 cm/s
VTI: 57.3 cm
Weight: 5360 oz

## 2017-08-14 LAB — BASIC METABOLIC PANEL
ANION GAP: 12 (ref 5–15)
BUN: 22 mg/dL — ABNORMAL HIGH (ref 6–20)
CHLORIDE: 105 mmol/L (ref 101–111)
CO2: 23 mmol/L (ref 22–32)
Calcium: 8.6 mg/dL — ABNORMAL LOW (ref 8.9–10.3)
Creatinine, Ser: 1.07 mg/dL — ABNORMAL HIGH (ref 0.44–1.00)
GFR calc Af Amer: >60 mL/min (ref >60)
GFR calc non Af Amer: 52 mL/min — ABNORMAL LOW (ref >60)
GLUCOSE: 305 mg/dL — AB (ref 65–99)
POTASSIUM: 4.1 mmol/L (ref 3.5–5.1)
Sodium: 140 mmol/L (ref 135–145)

## 2017-08-14 LAB — CBC WITH DIFFERENTIAL/PLATELET
BASOS ABS: 0 10*3/uL (ref 0.0–0.1)
Basophils Relative: 0 %
Eosinophils Absolute: 0 10*3/uL (ref 0.0–0.7)
Eosinophils Relative: 0 %
HEMATOCRIT: 36 % (ref 36.0–46.0)
HEMOGLOBIN: 11.4 g/dL — AB (ref 12.0–15.0)
LYMPHS PCT: 8 %
Lymphs Abs: 0.9 10*3/uL (ref 0.7–4.0)
MCH: 29.5 pg (ref 26.0–34.0)
MCHC: 31.7 g/dL (ref 30.0–36.0)
MCV: 93 fL (ref 78.0–100.0)
Monocytes Absolute: 0.7 10*3/uL (ref 0.1–1.0)
Monocytes Relative: 7 %
NEUTROS ABS: 9.1 10*3/uL — AB (ref 1.7–7.7)
NEUTROS PCT: 85 %
Platelets: 143 10*3/uL — ABNORMAL LOW (ref 150–400)
RBC: 3.87 MIL/uL (ref 3.87–5.11)
RDW: 14.4 % (ref 11.5–15.5)
WBC: 10.7 10*3/uL — ABNORMAL HIGH (ref 4.0–10.5)

## 2017-08-14 LAB — GLUCOSE, CAPILLARY
GLUCOSE-CAPILLARY: 304 mg/dL — AB (ref 65–99)
Glucose-Capillary: 292 mg/dL — ABNORMAL HIGH (ref 65–99)

## 2017-08-14 LAB — CULTURE, BLOOD (ROUTINE X 2): Special Requests: ADEQUATE

## 2017-08-14 MED ORDER — INSULIN NPH (HUMAN) (ISOPHANE) 100 UNIT/ML ~~LOC~~ SUSP
30.0000 [IU] | Freq: Two times a day (BID) | SUBCUTANEOUS | Status: DC
  Administered 2017-08-14 – 2017-08-15 (×3): 30 [IU] via SUBCUTANEOUS

## 2017-08-14 MED ORDER — SODIUM CHLORIDE 0.9 % IV SOLN
1000.0000 mL | INTRAVENOUS | Status: DC
  Administered 2017-08-14 – 2017-08-15 (×2): 1000 mL via INTRAVENOUS

## 2017-08-14 NOTE — Progress Notes (Signed)
  Echocardiogram 2D Echocardiogram has been performed.  Bobbye Charleston 08/14/2017, 9:49 AM

## 2017-08-14 NOTE — Progress Notes (Signed)
PT Cancellation Note  Patient Details Name: Christy May MRN: [id number] DOB: [birth year]   Cancelled Treatment:    Reason Eval/Treat Not Completed: Active bedrest order   Christy May,KATHrine E 08/14/2017, 8:21 AM Carmelia Bake, PT, DPT 08/14/2017 Pager: [phone]

## 2017-08-14 NOTE — Progress Notes (Signed)
PROGRESS NOTE    Christy May  XTK:[id number] DOB: 08-04-1950 DOA: 08/12/2017 PCP: Lucianne Lei, MD    Brief Narrative:  68 year old female presented after a fall.  She does have the significant past medical history of hypertension, type 2 diabetes mellitus, morbid obesity and depression. Patient had a significant decline in her functional physical capacity over the last 3 months. Multiple falls at home, generalized weakness, was not able to follow-up with her primary care physician. The day of admission she collapsed on the floor while trying to get into a vehicle, she was brought to the hospital for further evaluation. On initial physical examination blood pressure 137/52, 94/52, heart rate 140, respiratory 25, temperature 100.3, oxygen saturation 91%. Heart S1-S2 present tachycardic, lungs were clear to auscultation bilaterally, no wheezing, rales or rhonchi, abdomen was protuberant but nontender, no lower extremity edema, neurologically nonfocal. Sodium 139, potassium 4.2, chloride 99, bicarbonate 27, glucose 319, BUN 27, creatinine 2.18, calcium 9.1,(venous) lactic acid 2.3, white count 12.7, hemoglobin 12.1, hematocrit 173, urinalysis 30 protein, specific gravity 1.015, too numerous to count RBCs, too numerous to count white blood cells. Chest x-ray was hypoinflated, no infiltrates. EKG sinus rhythm, 127 bpm, normal axis and normal intervals.  Patient was admitted to the hospital due to sepsis related to urinary tract infection, complicated by acute kidney injury.    Assessment & Plan:   Principal Problem:   Sepsis due to gram-negative UTI [facility]) Active Problems:   Morbid obesity ([place])   Essential hypertension   Weakness generalized   Diabetes ([place])   Acute renal failure (ARF) (HCC)   Pressure injury of skin  1. Sepsis due to urinary tract infection with suspected adrenal insuficiency. Responding well to antibiotic therapy with IV ceftriaxone. Blood pressure has improved, continue  to target MAP greater than 65, mentation has improved. Wbc at 10, patient has been afebrile. Will continue to follow on cultures, cell count and temperature curve. Will continue beside telemetry monitoring for the next 24 hours. Will continue stress dose hydrocortisone to taper over the next 7 days.   2. AKI. Renal function with serum cr down to 1,07 from 1,59, K at 4,1 with Na at 140 and serum bicarbonate at 23, urine output 1,400 over last 24 hours, will decrease rate of isotonic saline to 50 ml per hour to prevent volume overload, follow a restrictive IV fluids strategy, Avoid hypotension and nephrotoxic medications.   3. Type 2 diabetes mellitus. Capillary glucose 275, 392, 366, 308, 292, will increase basal insulin regimen to NPH 30 units bid and continue to titrate, at home patient on 100 units.  4. HTN. Continue blood pressure monitoring, target MAP 65 or above, will continue to hold on irbesartan for now. Will continue low dose metoprolol to prevent rebound tachycardia.   5. Morbid obesity with ambulatory dysfunction. Supportive medical therapy, oxymetry monitoring and supplemental 02 per [place]. Patient has been wheelchair bound, before admission, will need physical therapy when more stable.    DVT prophylaxis: enoxaparin   Code Status:  full Family Communication: no family at the bedside  Disposition Plan: home   Consultants:     Procedures:     Antimicrobials:   Vancomycin   Aztreonam    Subjective: Patient feeling better, back pain has improved, no nausea or vomiting, no dyspnea or chest pain. No abdominal pain.   Objective: Vitals:   08/14/17 0310 08/14/17 0400 08/14/17 0500 08/14/17 0600  BP:  137/70 (!) 123/55 (!) 122/56  Pulse:  65 66  83  Resp:  12 11 14   Temp: 97.6 F (36.4 C)     TempSrc: Axillary     SpO2:  99% 98% 99%  Weight:        Intake/Output Summary (Last 24 hours) at 08/14/2017 7510 Last data filed at 08/14/2017 0600 Gross per 24 hour   Intake 1111.67 ml  Output 1400 ml  Net -288.33 ml   Filed Weights   08/12/17 1829  Weight: (!) 152 kg (335 lb)    Examination:   General: Not in pain or dyspnea, deconditioned Neurology: Awake and alert, non focal  E ENT: mild pallor, no icterus, oral mucosa moist Cardiovascular: No JVD. Distant S1-S2 present, rhythmic, no gallops, rubs, or murmurs. No lower extremity edema. Pulmonary: decreased breath sounds bilaterally at bases, adequate air movement, no wheezing, rhonchi or rales. Gastrointestinal. Abdomen protuberant, no organomegaly, non tender, no rebound or guarding Skin. No rashes Musculoskeletal: no joint deformities     Data Reviewed: I have personally reviewed following labs and imaging studies  CBC: Recent Labs  Lab 08/12/17 1740 08/13/17 0429 08/14/17 0351  WBC 12.7* 11.5* 10.7*  NEUTROABS 10.9*  --  9.1*  HGB 12.1 12.3 11.4*  HCT 38.4 38.4 36.0  MCV 91.6 92.3 93.0  PLT 173 169 258*   Basic Metabolic Panel: Recent Labs  Lab 08/12/17 1740 08/13/17 0429 08/14/17 0351  NA 139 139 140  K 4.2 3.6 4.1  CL 99* 102 105  CO2 27 24 23   GLUCOSE 319* 279* 305*  BUN 27* 24* 22*  CREATININE 2.18* 1.59* 1.07*  CALCIUM 9.1 9.0 8.6*   GFR: Estimated Creatinine Clearance: 75.4 mL/min (A) (by C-G formula based on SCr of 1.07 mg/dL (H)). Liver Function Tests: Recent Labs  Lab 08/12/17 1740  AST 20  ALT 21  ALKPHOS 74  BILITOT 0.5  PROT 6.3*  ALBUMIN 2.8*   No results for input(s): LIPASE, AMYLASE in the last 168 hours. No results for input(s): AMMONIA in the last 168 hours. Coagulation Profile: No results for input(s): INR, PROTIME in the last 168 hours. Cardiac Enzymes: No results for input(s): CKTOTAL, CKMB, CKMBINDEX, TROPONINI in the last 168 hours. BNP (last 3 results) No results for input(s): PROBNP in the last 8760 hours. HbA1C: Recent Labs    08/13/17 0200  HGBA1C 9.9*   CBG: Recent Labs  Lab 08/13/17 0740 08/13/17 1201  08/13/17 1648 08/13/17 2127 08/14/17 0732  GLUCAP 275* 392* 366* 308* 292*   Lipid Profile: No results for input(s): CHOL, HDL, LDLCALC, TRIG, CHOLHDL, LDLDIRECT in the last 72 hours. Thyroid Function Tests: No results for input(s): TSH, T4TOTAL, FREET4, T3FREE, THYROIDAB in the last 72 hours. Anemia Panel: No results for input(s): VITAMINB12, FOLATE, FERRITIN, TIBC, IRON, RETICCTPCT in the last 72 hours.    Radiology Studies: I have reviewed all of the imaging during this hospital visit personally     Scheduled Meds: . allopurinol  300 mg Oral Daily  . aspirin EC  81 mg Oral QHS  . Chlorhexidine Gluconate Cloth  6 each Topical Q0600  . cycloSPORINE  1 drop Both Eyes BID  . docusate sodium  100 mg Oral BID  . enoxaparin (LOVENOX) injection  75 mg Subcutaneous Daily  . famotidine  40 mg Oral QHS  . fluticasone  2 spray Each Nare Daily  . folic acid  4 mg Oral Daily  . gabapentin  800 mg Oral TID  . hydrocortisone sod succinate (SOLU-CORTEF) inj  100 mg Intravenous Q8H  .  insulin aspart  0-20 Units Subcutaneous TID WC  . insulin aspart  0-5 Units Subcutaneous QHS  . insulin NPH Human  20 Units Subcutaneous BID AC & HS  . loratadine  10 mg Oral Daily  . mouth rinse  15 mL Mouth Rinse BID  . metoprolol tartrate  25 mg Oral BID  . montelukast  10 mg Oral QHS  . mupirocin ointment  1 application Nasal BID  . pantoprazole  40 mg Oral BID  . sodium chloride flush  10-40 mL Intracatheter Q12H  . sodium chloride flush  3 mL Intravenous Q12H   Continuous Infusions: . sodium chloride 1,000 mL (08/13/17 2244)  . cefTRIAXone (ROCEPHIN)  IV Stopped (08/13/17 0915)     LOS: 2 days        Mauricio Gerome Apley, MD [facility] Pager [phone]

## 2017-08-15 LAB — GLUCOSE, CAPILLARY
GLUCOSE-CAPILLARY: 275 mg/dL — AB (ref 65–99)
GLUCOSE-CAPILLARY: 369 mg/dL — AB (ref 65–99)
GLUCOSE-CAPILLARY: 274 mg/dL — AB (ref 65–99)
Glucose-Capillary: 309 mg/dL — ABNORMAL HIGH (ref 65–99)
Glucose-Capillary: 346 mg/dL — ABNORMAL HIGH (ref 65–99)
Glucose-Capillary: 272 mg/dL — ABNORMAL HIGH (ref 65–99)

## 2017-08-15 LAB — URINE CULTURE: Culture: >=100000 — AB

## 2017-08-15 LAB — BASIC METABOLIC PANEL
Anion gap: 11 (ref 5–15)
BUN: 23 mg/dL — AB (ref 6–20)
CALCIUM: 8.7 mg/dL — AB (ref 8.9–10.3)
CO2: 24 mmol/L (ref 22–32)
Chloride: 105 mmol/L (ref 101–111)
Creatinine, Ser: 1.08 mg/dL — ABNORMAL HIGH (ref 0.44–1.00)
GFR calc Af Amer: >60 mL/min (ref >60)
GFR, EST NON AFRICAN AMERICAN: 52 mL/min — AB (ref >60)
GLUCOSE: 336 mg/dL — AB (ref 65–99)
POTASSIUM: 4.2 mmol/L (ref 3.5–5.1)
Sodium: 140 mmol/L (ref 135–145)

## 2017-08-15 LAB — CBC WITH DIFFERENTIAL/PLATELET
Basophils Absolute: 0 10*3/uL (ref 0.0–0.1)
Basophils Relative: 0 %
EOS PCT: 0 %
Eosinophils Absolute: 0 10*3/uL (ref 0.0–0.7)
HCT: 34.3 % — ABNORMAL LOW (ref 36.0–46.0)
Hemoglobin: 10.9 g/dL — ABNORMAL LOW (ref 12.0–15.0)
LYMPHS ABS: 0.9 10*3/uL (ref 0.7–4.0)
LYMPHS PCT: 11 %
MCH: 29.4 pg (ref 26.0–34.0)
MCHC: 31.8 g/dL (ref 30.0–36.0)
MCV: 92.5 fL (ref 78.0–100.0)
MONO ABS: 0.5 10*3/uL (ref 0.1–1.0)
MONOS PCT: 5 %
Neutro Abs: 7.4 10*3/uL (ref 1.7–7.7)
Neutrophils Relative %: 84 %
PLATELETS: 157 10*3/uL (ref 150–400)
RBC: 3.71 MIL/uL — ABNORMAL LOW (ref 3.87–5.11)
RDW: 14.3 % (ref 11.5–15.5)
WBC: 8.8 10*3/uL (ref 4.0–10.5)

## 2017-08-15 MED ORDER — SALINE SPRAY 0.65 % NA SOLN
1.0000 | NASAL | Status: DC | PRN
  Administered 2017-08-15: 1 via NASAL
  Filled 2017-08-15: qty 44

## 2017-08-15 MED ORDER — HYDROCORTISONE NA SUCCINATE PF 100 MG IJ SOLR
100.0000 mg | Freq: Two times a day (BID) | INTRAMUSCULAR | Status: DC
  Administered 2017-08-15 – 2017-08-17 (×3): 100 mg via INTRAVENOUS
  Filled 2017-08-15 (×5): qty 2

## 2017-08-15 MED ORDER — INSULIN NPH (HUMAN) (ISOPHANE) 100 UNIT/ML ~~LOC~~ SUSP
40.0000 [IU] | Freq: Two times a day (BID) | SUBCUTANEOUS | Status: DC
  Administered 2017-08-15 – 2017-08-16 (×2): 40 [IU] via SUBCUTANEOUS
  Filled 2017-08-15: qty 10

## 2017-08-15 NOTE — Evaluation (Signed)
Physical Therapy Evaluation Patient Details Name: Christy May MRN: [id number] DOB: 02-Sep-1950 Today's Date: 08/15/2017   History of Present Illness  67 y.o. female with medical history significant of HTN; DM; morbid obesity (BMI 57.50); and depression presenting because she is unable to care for herself at home.  Patient went on Thanksgiving to [facility] with her brother and sister who live in [place].  When she went to come home, her legs gave out and were hurting.  They took her to the hospital in [place], [place].  Her daughter brought her home about a week later.  She went into rehab at [facility].  She caught a cold and bronchitis from a roommate.  She started falling and kept falling.  She fell on New Year's Day and several other times.  She was released from there about 2 weeks ago and she was supposed to have [facility] coming to her house for 2 months.  She did not know she had to call them and so she was home for over a week by herself.   She was unable to move in her house and so stayed in a lift chair. Pt admitted with UTI, sepsis.   Clinical Impression  Pt admitted with above diagnosis. Pt currently with functional limitations due to the deficits listed below (see PT Problem List). +2 mod assist for rolling and sidelying to sit. Pt sat on edge of bed x 15 minutes. Transfer not attempted due to high falls risk (multiple falls recently, BLE weakness). ST-SNF recommended.  Pt will benefit from skilled PT to increase their independence and safety with mobility to allow discharge to the venue listed below.       Follow Up Recommendations SNF;Supervision/Assistance - 24 hour    Equipment Recommendations  None recommended by PT    Recommendations for Other Services OT consult     Precautions / Restrictions Precautions Precautions: Fall Precaution Comments: multiple falls in past 2 months Restrictions Weight Bearing Restrictions: No      Mobility  Bed  Mobility Overal bed mobility: Needs Assistance Bed Mobility: Rolling;Sidelying to Sit;Sit to Supine Rolling: Mod assist Sidelying to sit: Mod assist;+2 for physical assistance   Sit to supine: Mod assist;+2 for physical assistance   General bed mobility comments: increased time and effort, put puts forth good effort, +2 assist for rolling and sidelying to sit, assist to initiate roll and to raise trunk/advance BLEs; assist for LEs into bed. Pt sat on edge of bed x 15 minutes.   Transfers                 General transfer comment: not attempted 2* BLE weakness and recent h/o multiple falls  Ambulation/Gait                Stairs            Wheelchair Mobility    Modified Rankin (Stroke Patients Only)       Balance Overall balance assessment: Needs assistance Sitting-balance support: Feet supported;Single extremity supported Sitting balance-Leahy Scale: Fair Sitting balance - Comments: pt sat on EOB x 15 minutes                                     Pertinent Vitals/Pain Pain Assessment: No/denies pain    Home Living Family/patient expects to be discharged to:: Private residence Living Arrangements: Alone Available Help at Discharge: Family;Friend(s);Available PRN/intermittently  Type of Home: House Home Access: Stairs to enter   [employer] of Steps: 1 Home Layout: One level [facility] bed;Walker - 4 wheels;Bedside commode;Wheelchair - manual      Prior Function           Comments: was only pivoting to bedside commode, for several days prior to admission she remained in her lift chair and was unable to get up; minimal walking since NOvember 2018 hospitalization     Hand Dominance        Extremity/Trunk Assessment   Upper Extremity Assessment Upper Extremity Assessment: Generalized weakness    Lower Extremity Assessment Lower Extremity Assessment: RLE deficits/detail;LLE deficits/detail RLE  Deficits / Details: knee ext 3/5, sensation intact to light touch LLE Deficits / Details: knee ext +3/5, sensation intact to light touch       Communication   Communication: No difficulties  Cognition Arousal/Alertness: Awake/alert Behavior During Therapy: WFL for tasks assessed/performed Overall Cognitive Status: Within Functional Limits for tasks assessed                                        General Comments      Exercises     Assessment/Plan    PT Assessment Patient needs continued PT services  PT Problem List Decreased mobility;Decreased strength;Decreased balance;Decreased activity tolerance;Obesity       PT Treatment Interventions DME instruction;Functional mobility training;Therapeutic activities;Therapeutic exercise;Balance training;Patient/family education    PT Goals (Current goals can be found in the Care Plan section)  Acute Rehab PT Goals Patient Stated Goal: to walk again PT Goal Formulation: With patient Time For Goal Achievement: 08/29/17 Potential to Achieve Goals: Fair    Frequency Min 2X/week   Barriers to discharge        Co-evaluation               AM-PAC PT "6 Clicks" Daily Activity  Outcome Measure Difficulty turning over in bed (including adjusting bedclothes, sheets and blankets)?: Unable Difficulty moving from lying on back to sitting on the side of the bed? : Unable Difficulty sitting down on and standing up from a chair with arms (e.g., wheelchair, bedside commode, etc,.)?: Unable Help needed moving to and from a bed to chair (including a wheelchair)?: Total Help needed walking in hospital room?: Total Help needed climbing 3-5 steps with a railing? : Total 6 Click Score: 6    End of Session   Activity Tolerance: Patient tolerated treatment well Patient left: in bed;with call bell/phone within reach Nurse Communication: Mobility status;Need for lift equipment PT Visit Diagnosis: Repeated falls (R29.6);History  of falling (Z91.81);Difficulty in walking, not elsewhere classified (R26.2);Muscle weakness (generalized) (M62.81);Adult, failure to thrive (R62.7)    Time: 1321-1401 PT Time Calculation (min) (ACUTE ONLY): 40 min   Charges:   PT Evaluation $PT Eval Moderate Complexity: 1 Mod PT Treatments $Therapeutic Exercise: 23-37 mins   PT G Codes:          Philomena Doheny 08/15/2017, 2:14 PM [phone]

## 2017-08-15 NOTE — Progress Notes (Addendum)
PROGRESS NOTE    Christy May  JJH:[id number] DOB: 03-02-1951 DOA: 08/12/2017 PCP: Lucianne Lei, MD    Brief Narrative:  67 year old female presented after a fall.She does have the significantpast medical history of hypertension, type 2 diabetes mellitus, morbid obesity and depression.Patient had a significant decline in her functional physical capacity over the last 3 months.Multiple falls at home,generalized weakness,was not able to follow-up with her primary care physician.The day of admission she collapsed on the floor while trying to get into a vehicle,she was brought to the hospital for further evaluation. On initial physical examination blood pressure 137/52, 94/52, heart rate 140, respiratory 25, temperature 100.3, oxygen saturation 91%.Heart S1-S2 present tachycardic, lungs were clear to auscultation bilaterally, no wheezing, rales or rhonchi, abdomen was protuberant but nontender, no lower extremity edema, neurologically nonfocal. Sodium 139, potassium 4.2, chloride 99, bicarbonate 27, glucose 319, BUN 27, creatinine 2.18, calcium 9.1,(venous)lactic acid 2.3, white count 12.7, hemoglobin 12.1, hematocrit 173,urinalysis 30 protein, specific gravity 1.015,toonumerous to count RBCs, too numerous to count white blood cells.Chest x-ray was hypoinflated, no infiltrates. EKG sinus rhythm, 127 bpm,normal axis and normal intervals.  Patient was admitted to the hospital due to sepsis related to urinary tract infection,complicated by acute kidney injury.    Assessment & Plan:   Principal Problem:   Sepsis due to gram-negative UTI [facility]) Active Problems:   Morbid obesity ([place])   Essential hypertension   Weakness generalized   Diabetes ([place])   Acute renal failure (ARF) (HCC)   Pressure injury of skin   1.Sepsis due to urinary tract infection due to E. Coli, complicated with suspected adrenal  Insuficiency. Continue antibiotic therapy with IV ceftriaxone #2. Patient has  remained afebrile, wbc down to 8.8, urine culture positive for e. Coli. Blood culture with coag negative staph, suspected to be contamination, will repeat blood cultures today. Start to taper stress dose steroids to bid hydrocortisone, plan to return to home dose of prednisone 20 mg in 7 days. Discontinue cardiac monitoring, transfer to medicine floor. Echocardiography with normal LV systolic function.   2. AKI. Continue to improve renal function with serum cr at 1,08, serum K at 4,2 and serum bicarbonate at 24, will hold on further IV fluids. Follow renal panel in am, blood pressure has been stable.   3.Type 2 diabetes mellitus. Uncontrolled hyperglycemia, will continue to increase basal insulin to 40 units bis, at home on more than 100 units, Taper steroids. Patient tolerating po well, no nausea or vomiting. Capillary glucose 292, 304, 309, 275, 346.   4. HTN. Tolerating well low dose metoprolol, holding irbesartan. Will hold on further IV fluids for now.    5.Morbid obesity with ambulatory dysfunction. Patient has been wheelchair bound, before admission, out of bed tid with meals, and  physical therapy evaluation.  Right buttocks, 3 areas of pressure injuries, stage 2-POA, present on admission, will consult wound care.   DVT prophylaxis:enoxaparin Code Status:full Family Communication:no family at the bedside Disposition Plan:home   Consultants:    Procedures:    Antimicrobials:  Ceftraiaxone    Subjective: Patient feeling better, no nausea or vomiting, no chest pain or dyspnea. Very weak and deconditioned.   Objective: Vitals:   08/15/17 0334 08/15/17 0400 08/15/17 0500 08/15/17 0600  BP:  (!) 151/78 124/60 (!) 123/55  Pulse:  67 (!) 55 (!) 55  Resp:  14 12 11   Temp: 98.7 F (37.1 C)     TempSrc: Oral     SpO2:  96% 97% 96%  Weight: (!) 188.7 kg (416 lb)       Intake/Output Summary (Last 24 hours) at 08/15/2017 0848 Last data filed at  08/15/2017 0544 Gross per 24 hour  Intake 644.17 ml  Output 1675 ml  Net -1030.83 ml   Filed Weights   08/12/17 1829 08/15/17 0334  Weight: (!) 152 kg (335 lb) (!) 188.7 kg (416 lb)    Examination:   General: Not in pain or dyspnea, deconditioned Neurology: Awake and alert, non focal  E ENT: no pallor, no icterus, oral mucosa moist Cardiovascular: No JVD. distant S1-S2 present, rhythmic, no gallops, rubs, or murmurs. No lower extremity edema. Pulmonary: vesicular breath sounds bilaterally, adequate air movement, no wheezing, rhonchi or rales. Mild decreased breath sounds at bases.  Gastrointestinal. Abdomen protuberant, no organomegaly, non tender, no rebound or guarding Skin. No rashes Musculoskeletal: no joint deformities     Data Reviewed: I have personally reviewed following labs and imaging studies  CBC: Recent Labs  Lab 08/12/17 1740 08/13/17 0429 08/14/17 0351 08/15/17 0359  WBC 12.7* 11.5* 10.7* 8.8  NEUTROABS 10.9*  --  9.1* 7.4  HGB 12.1 12.3 11.4* 10.9*  HCT 38.4 38.4 36.0 34.3*  MCV 91.6 92.3 93.0 92.5  PLT 173 169 143* 253   Basic Metabolic Panel: Recent Labs  Lab 08/12/17 1740 08/13/17 0429 08/14/17 0351 08/15/17 0359  NA 139 139 140 140  K 4.2 3.6 4.1 4.2  CL 99* 102 105 105  CO2 27 24 23 24   GLUCOSE 319* 279* 305* 336*  BUN 27* 24* 22* 23*  CREATININE 2.18* 1.59* 1.07* 1.08*  CALCIUM 9.1 9.0 8.6* 8.7*   GFR: Estimated Creatinine Clearance: 86.4 mL/min (A) (by C-G formula based on SCr of 1.08 mg/dL (H)). Liver Function Tests: Recent Labs  Lab 08/12/17 1740  AST 20  ALT 21  ALKPHOS 74  BILITOT 0.5  PROT 6.3*  ALBUMIN 2.8*   No results for input(s): LIPASE, AMYLASE in the last 168 hours. No results for input(s): AMMONIA in the last 168 hours. Coagulation Profile: No results for input(s): INR, PROTIME in the last 168 hours. Cardiac Enzymes: No results for input(s): CKTOTAL, CKMB, CKMBINDEX, TROPONINI in the last 168 hours. BNP  (last 3 results) No results for input(s): PROBNP in the last 8760 hours. HbA1C: Recent Labs    08/13/17 0200  HGBA1C 9.9*   CBG: Recent Labs  Lab 08/14/17 0732 08/14/17 1134 08/14/17 1807 08/14/17 2216 08/15/17 0836  GLUCAP 292* 304* 309* 275* 346*   Lipid Profile: No results for input(s): CHOL, HDL, LDLCALC, TRIG, CHOLHDL, LDLDIRECT in the last 72 hours. Thyroid Function Tests: No results for input(s): TSH, T4TOTAL, FREET4, T3FREE, THYROIDAB in the last 72 hours. Anemia Panel: No results for input(s): VITAMINB12, FOLATE, FERRITIN, TIBC, IRON, RETICCTPCT in the last 72 hours.    Radiology Studies: I have reviewed all of the imaging during this hospital visit personally     Scheduled Meds: . allopurinol  300 mg Oral Daily  . aspirin EC  81 mg Oral QHS  . Chlorhexidine Gluconate Cloth  6 each Topical Q0600  . cycloSPORINE  1 drop Both Eyes BID  . docusate sodium  100 mg Oral BID  . enoxaparin (LOVENOX) injection  75 mg Subcutaneous Daily  . famotidine  40 mg Oral QHS  . fluticasone  2 spray Each Nare Daily  . folic acid  4 mg Oral Daily  . gabapentin  800 mg Oral TID  . hydrocortisone sod succinate (SOLU-CORTEF) inj  100  mg Intravenous Q8H  . insulin aspart  0-20 Units Subcutaneous TID WC  . insulin aspart  0-5 Units Subcutaneous QHS  . insulin NPH Human  30 Units Subcutaneous BID AC & HS  . loratadine  10 mg Oral Daily  . mouth rinse  15 mL Mouth Rinse BID  . metoprolol tartrate  25 mg Oral BID  . montelukast  10 mg Oral QHS  . mupirocin ointment  1 application Nasal BID  . pantoprazole  40 mg Oral BID  . sodium chloride flush  10-40 mL Intracatheter Q12H  . sodium chloride flush  3 mL Intravenous Q12H   Continuous Infusions: . sodium chloride 1,000 mL (08/15/17 0544)  . cefTRIAXone (ROCEPHIN)  IV Stopped (08/14/17 1130)     LOS: 3 days        Keyshon Stein Gerome Apley, MD [facility] Pager [phone]

## 2017-08-15 NOTE — Care Management Important Message (Signed)
Important Message  Patient Details  Name: Christy May MRN: [id number] Date of Birth: February 08, 1951   Medicare Important Message Given:  Yes    Apolonio Schneiders, RN 08/15/2017, 10:32 AM

## 2017-08-16 ENCOUNTER — Ambulatory Visit: Payer: Self-pay | Admitting: *Deleted

## 2017-08-16 DIAGNOSIS — N390 Urinary tract infection, site not specified: Secondary | ICD-10-CM

## 2017-08-16 DIAGNOSIS — N179 Acute kidney failure, unspecified: Secondary | ICD-10-CM

## 2017-08-16 DIAGNOSIS — A4150 Gram-negative sepsis, unspecified: Secondary | ICD-10-CM

## 2017-08-16 DIAGNOSIS — I10 Essential (primary) hypertension: Secondary | ICD-10-CM

## 2017-08-16 LAB — BASIC METABOLIC PANEL
ANION GAP: 9 (ref 5–15)
BUN: 27 mg/dL — ABNORMAL HIGH (ref 6–20)
CALCIUM: 8.9 mg/dL (ref 8.9–10.3)
CHLORIDE: 106 mmol/L (ref 101–111)
CO2: 25 mmol/L (ref 22–32)
Creatinine, Ser: 1.06 mg/dL — ABNORMAL HIGH (ref 0.44–1.00)
GFR calc non Af Amer: 53 mL/min — ABNORMAL LOW (ref >60)
Glucose, Bld: 269 mg/dL — ABNORMAL HIGH (ref 65–99)
Potassium: 3.8 mmol/L (ref 3.5–5.1)
Sodium: 140 mmol/L (ref 135–145)

## 2017-08-16 LAB — GLUCOSE, CAPILLARY
GLUCOSE-CAPILLARY: 218 mg/dL — AB (ref 65–99)
GLUCOSE-CAPILLARY: 222 mg/dL — AB (ref 65–99)
Glucose-Capillary: 274 mg/dL — ABNORMAL HIGH (ref 65–99)
Glucose-Capillary: 166 mg/dL — ABNORMAL HIGH (ref 65–99)

## 2017-08-16 MED ORDER — INSULIN NPH (HUMAN) (ISOPHANE) 100 UNIT/ML ~~LOC~~ SUSP
45.0000 [IU] | Freq: Two times a day (BID) | SUBCUTANEOUS | Status: DC
  Administered 2017-08-16: 45 [IU] via SUBCUTANEOUS
  Filled 2017-08-16: qty 10

## 2017-08-16 NOTE — Progress Notes (Addendum)
Inpatient Diabetes Program Recommendations  AACE/ADA: New Consensus Statement on Inpatient Glycemic Control (2015)  Target Ranges:  Prepandial:   less than 140 mg/dL      Peak postprandial:   less than 180 mg/dL (1-2 hours)      Critically ill patients:  140 - 180 mg/dL   Results for Christy May, Christy May (MRN [id number]) as of 08/16/2017 10:10  Ref. Range 08/15/2017 08:36 08/15/2017 13:09 08/15/2017 15:48 08/15/2017 21:37  Glucose-Capillary Latest Ref Range: 65 - 99 mg/dL 346 (H) 369 (H) 274 (H) 272 (H)   Results for Christy May, Christy May (MRN [id number]) as of 08/16/2017 10:10  Ref. Range 08/16/2017 07:28  Glucose-Capillary Latest Ref Range: 65 - 99 mg/dL 218 (H)    Home DM Meds: NPH 120 units QAM  Endocrinologist: Dr. Einar Crow  Current Orders: NPH 40 units BID      Novolog Resistant Correction Scale/ SSI (0-20 units) TID AC + HS      Note patient getting Solucortef 100 BID.  CBGs elevated likely due to steroids.  Note NPH insulin increased to 40 units BID yesterday afternoon.  Pt got total of 70 units NPH yesterday.  Will get total of 80 units NPH today.  MD- Please consider the following in-hospital insulin adjustments:  1. Increase NPH further to 45 units BID  2. Start Novolog Meal Coverage: Novolog 6 units TID with meals (hold if pt eats <50% of meal)      --Will follow patient during hospitalization--  [name] RN, MSN, CDE Diabetes Coordinator Inpatient Glycemic Control Team Team Pager: [phone] (8a-5p)

## 2017-08-16 NOTE — Progress Notes (Signed)
PROGRESS NOTE [facility]   Christy May   EVO:[id number] DOB: Dec 12, 1950  DOA: 08/12/2017 PCP: Lucianne Lei, MD   Brief Narrative:  Christy May is a 67 year old female with past medical history of hypertension, type 2 diabetes mellitus, morbid obesity and depression presented to the emergency department after mechanical fall.  Patient has significant decline in functional ability for the past 3 months prior to admission.  Patient was discharged from SNF and was unable to walk, subsequently fell multiple times at home.  Upon ED evaluation patient was found to have hypotension, elevated white count and UA grossly abnormal consistent with UTI.  Patient was admitted with working diagnosis of sepsis related to UTI uncomplicated by acute renal injury.  There was a suspicion of adrenal insufficiency for which patient was started on stress dose steroids. Patient was started on empiric antibiotics.   Subjective: Patient seen and examined, she reported feeling significantly better.  She was up with physical therapy.  she has back pain on and off which is chronic.  Denies abdominal pain, nausea, vomiting and difficulty with urination.  Assessment & Plan: Sepsis due to urinary tract infection from E. coli, complicated with possible adrenal insufficiency Patient on ceftriaxone, remains afebrile, WBC trending down. Blood culture coag negative staph 1/4, suspect contamination.  Repeated blood cultures no growth so far.  Patient was on high dose hydrocortisone, will continue taper down to return to prednisone 20 mg which is her home dose.  If continues to be afebrile and clinically improving will change antibiotic to [facility] and continue treatment at home.  Acute renal failure Felt to be prerenal due to infectious process Creatinine continues to improve, at baseline Monitor BMP in a.m.  Type 2 diabetes mellitus CBGs slight elevated Likely due to high-dose steroids Will increase NPH to  45 units twice daily Continue SSI  Hypertension BP acceptable Continue current regimen, ARB on hold due to AKI, will consider to resume in a.m. if creatinine remains stable. Continue to monitor  Morbid obesity, with ambulatory dysfunction Patient was wheelchair-bound prior to admission, limited mobility and unable to perform most ADLs. PT has been consulted and recommending SNF.  Case discussed with CWS if patient continues to be clinically stable can be DC to SNF in a.m.  Right buttock pressure ulcer stage II  POA Wound care per nursing protocol   DVT prophylaxis: Lovenox Code Status: Full code Family Communication: None at bedside Disposition Plan: SNF   Consultants:   None  Procedures:   None  Antimicrobials: Anti-infectives (From admission, onward)   Start     Dose/Rate Route Frequency Ordered Stop   08/13/17 0800  cefTRIAXone (ROCEPHIN) 2 g in sodium chloride 0.9 % 100 mL IVPB     2 g 200 mL/hr over 30 Minutes Intravenous Every 24 hours 08/13/17 0220     08/12/17 1830  cefTRIAXone (ROCEPHIN) 1 g in sodium chloride 0.9 % 100 mL IVPB     1 g 200 mL/hr over 30 Minutes Intravenous  Once 08/12/17 1823 08/12/17 1919           Objective: Vitals:   08/15/17 2332 08/16/17 0000 08/16/17 0625 08/16/17 1340  BP:  (!) 171/66 (!) 152/68 (!) 154/63  Pulse:  67 63 67  Resp:  13 12 14   Temp: 98.1 F (36.7 C)  98.1 F (36.7 C) (!) 97.4 F (36.3 C)  TempSrc: Oral  Oral Oral  SpO2:  96% 97% 98%  Weight:  Intake/Output Summary (Last 24 hours) at 08/16/2017 1750 Last data filed at 08/16/2017 0930 Gross per 24 hour  Intake 360 ml  Output 850 ml  Net -490 ml   Filed Weights   08/12/17 1829 08/15/17 0334  Weight: (!) 152 kg (335 lb) (!) 188.7 kg (416 lb)    Examination:  General exam: NAD, deconditioned HEENT: AC/AT, PERRLA, OP moist and clear Respiratory system: Clear to auscultation. No wheezes,crackle or rhonchi Cardiovascular system: S1 & S2 heard,  RRR. No JVD, murmurs, rubs or gallops Gastrointestinal system: Obese, nondistended nontender, soft. Central nervous system: Alert and oriented. No focal neurological deficits. Extremities: No pedal edema.  Bilateral lower extremity weakness, 4/5 Skin: No rashes Psychiatry: Mood & affect appropriate.    Data Reviewed: I have personally reviewed following labs and imaging studies  CBC: Recent Labs  Lab 08/12/17 1740 08/13/17 0429 08/14/17 0351 08/15/17 0359  WBC 12.7* 11.5* 10.7* 8.8  NEUTROABS 10.9*  --  9.1* 7.4  HGB 12.1 12.3 11.4* 10.9*  HCT 38.4 38.4 36.0 34.3*  MCV 91.6 92.3 93.0 92.5  PLT 173 169 143* 938   Basic Metabolic Panel: Recent Labs  Lab 08/12/17 1740 08/13/17 0429 08/14/17 0351 08/15/17 0359 08/16/17 0323  NA 139 139 140 140 140  K 4.2 3.6 4.1 4.2 3.8  CL 99* 102 105 105 106  CO2 27 24 23 24 25   GLUCOSE 319* 279* 305* 336* 269*  BUN 27* 24* 22* 23* 27*  CREATININE 2.18* 1.59* 1.07* 1.08* 1.06*  CALCIUM 9.1 9.0 8.6* 8.7* 8.9   GFR: Estimated Creatinine Clearance: 88.1 mL/min (A) (by C-G formula based on SCr of 1.06 mg/dL (H)). Liver Function Tests: Recent Labs  Lab 08/12/17 1740  AST 20  ALT 21  ALKPHOS 74  BILITOT 0.5  PROT 6.3*  ALBUMIN 2.8*   No results for input(s): LIPASE, AMYLASE in the last 168 hours. No results for input(s): AMMONIA in the last 168 hours. Coagulation Profile: No results for input(s): INR, PROTIME in the last 168 hours. Cardiac Enzymes: No results for input(s): CKTOTAL, CKMB, CKMBINDEX, TROPONINI in the last 168 hours. BNP (last 3 results) No results for input(s): PROBNP in the last 8760 hours. HbA1C: No results for input(s): HGBA1C in the last 72 hours. CBG: Recent Labs  Lab 08/15/17 1548 08/15/17 2137 08/16/17 0728 08/16/17 1134 08/16/17 1658  GLUCAP 274* 272* 218* 274* 222*   Lipid Profile: No results for input(s): CHOL, HDL, LDLCALC, TRIG, CHOLHDL, LDLDIRECT in the last 72 hours. Thyroid Function  Tests: No results for input(s): TSH, T4TOTAL, FREET4, T3FREE, THYROIDAB in the last 72 hours. Anemia Panel: No results for input(s): VITAMINB12, FOLATE, FERRITIN, TIBC, IRON, RETICCTPCT in the last 72 hours. Sepsis Labs: Recent Labs  Lab 08/12/17 1752 08/12/17 1847 08/13/17 0200 08/13/17 0429  PROCALCITON  --   --  1.13  --   LATICACIDVEN 2.38* 1.86 2.5* 1.4    Recent Results (from the past 240 hour(s))  Blood Culture (routine x 2)     Status: Abnormal   Collection Time: 08/12/17  6:23 PM  Result Value Ref Range Status   Specimen Description   Final    BLOOD RIGHT ANTECUBITAL Performed at [facility], [place] [address] [place], [place] [address]    Special Requests   Final    BOTTLES DRAWN AEROBIC AND ANAEROBIC Blood Culture adequate volume Performed at [facility] [place] [address], [place], [place] [address]    Culture  Setup Time  Final    GRAM POSITIVE COCCI AEROBIC BOTTLE ONLY CRITICAL RESULT CALLED TO, READ BACK BY AND VERIFIED WITH: PHARMD E WILLIAMSON [id number] MLM    Culture (A)  Final    STAPHYLOCOCCUS SPECIES (COAGULASE NEGATIVE) THE SIGNIFICANCE OF ISOLATING THIS ORGANISM FROM A SINGLE SET OF BLOOD CULTURES WHEN MULTIPLE SETS ARE DRAWN IS UNCERTAIN. PLEASE NOTIFY THE MICROBIOLOGY DEPARTMENT WITHIN ONE WEEK IF SPECIATION AND SENSITIVITIES ARE REQUIRED. Performed at [facility], [place] [address], [place], [place] [address]    Report Status 08/14/2017 FINAL  Final  Blood Culture ID Panel (Reflexed)     Status: Abnormal   Collection Time: 08/12/17  6:23 PM  Result Value Ref Range Status   Enterococcus species NOT DETECTED NOT DETECTED Final   Vancomycin resistance NOT DETECTED NOT DETECTED Final   Listeria monocytogenes NOT DETECTED NOT DETECTED Final   Staphylococcus species DETECTED (A) NOT DETECTED Final    Comment: CRITICAL RESULT CALLED TO, READ BACK BY AND VERIFIED WITH: PHARMD E WILLIAMSON [id number]  MLM Methicillin (oxacillin) susceptible coagulase negative staphylococcus. Possible blood culture contaminant (unless isolated from more than one blood culture draw or clinical case suggests pathogenicity). No antibiotic treatment is indicated for blood  culture contaminants.    Staphylococcus aureus NOT DETECTED NOT DETECTED Final   Methicillin resistance NOT DETECTED NOT DETECTED Final   Streptococcus species NOT DETECTED NOT DETECTED Final   Streptococcus agalactiae NOT DETECTED NOT DETECTED Final   Streptococcus pneumoniae NOT DETECTED NOT DETECTED Final   Streptococcus pyogenes NOT DETECTED NOT DETECTED Final   Acinetobacter baumannii NOT DETECTED NOT DETECTED Final   Enterobacteriaceae species NOT DETECTED NOT DETECTED Final   Enterobacter cloacae complex NOT DETECTED NOT DETECTED Final   Escherichia coli NOT DETECTED NOT DETECTED Final   Klebsiella oxytoca NOT DETECTED NOT DETECTED Final   Klebsiella pneumoniae NOT DETECTED NOT DETECTED Final   Proteus species NOT DETECTED NOT DETECTED Final   Serratia marcescens NOT DETECTED NOT DETECTED Final   Carbapenem resistance NOT DETECTED NOT DETECTED Final   Haemophilus influenzae NOT DETECTED NOT DETECTED Final   Neisseria meningitidis NOT DETECTED NOT DETECTED Final   Pseudomonas aeruginosa NOT DETECTED NOT DETECTED Final   Candida albicans NOT DETECTED NOT DETECTED Final   Candida glabrata NOT DETECTED NOT DETECTED Final   Candida krusei NOT DETECTED NOT DETECTED Final   Candida parapsilosis NOT DETECTED NOT DETECTED Final   Candida tropicalis NOT DETECTED NOT DETECTED Final    Comment: Performed at [facility], [address], [place], [place] [address]  Culture, Urine     Status: Abnormal   Collection Time: 08/12/17  7:30 PM  Result Value Ref Range Status   Specimen Description   Final    URINE, CLEAN CATCH Performed at [facility], [place] [address], [place], [place] [address]    Special Requests    Final    NONE Performed at [facility], [place] [address] [place], [place] [address]    Culture >=100,000 COLONIES/mL ESCHERICHIA COLI (A)  Final   Report Status 08/15/2017 FINAL  Final   Organism ID, Bacteria ESCHERICHIA COLI (A)  Final      Susceptibility   Escherichia coli - MIC*    AMPICILLIN <=2 SENSITIVE Sensitive     CEFAZOLIN <=4 SENSITIVE Sensitive     CEFTRIAXONE <=1 SENSITIVE Sensitive     CIPROFLOXACIN <=0.25 SENSITIVE Sensitive     GENTAMICIN <=1 SENSITIVE Sensitive     IMIPENEM <=0.25  SENSITIVE Sensitive     NITROFURANTOIN <=16 SENSITIVE Sensitive     TRIMETH/SULFA <=20 SENSITIVE Sensitive     AMPICILLIN/SULBACTAM <=2 SENSITIVE Sensitive     PIP/TAZO <=4 SENSITIVE Sensitive     Extended ESBL NEGATIVE Sensitive     * >=100,000 COLONIES/mL ESCHERICHIA COLI  Blood Culture (routine x 2)     Status: None (Preliminary result)   Collection Time: 08/13/17  1:23 AM  Result Value Ref Range Status   Specimen Description   Final    BLOOD LEFT HAND Performed at [facility] [address] [place], [place] [address]    Special Requests   Final    IN PEDIATRIC BOTTLE Blood Culture adequate volume Performed at [place] [address], [place], [place] [address]    Culture   Final    NO GROWTH 3 DAYS Performed at [facility], [place] [address] [place], [place] [address]    Report Status PENDING  Incomplete  MRSA PCR Screening     Status: Abnormal   Collection Time: 08/13/17  2:00 AM  Result Value Ref Range Status   MRSA by PCR POSITIVE (A) NEGATIVE Final    Comment:        The GeneXpert MRSA Assay (FDA approved for NASAL specimens only), is one component of a comprehensive MRSA colonization surveillance program. It is not intended to diagnose MRSA infection nor to guide or monitor treatment for MRSA infections. RESULT CALLED TO, READ BACK BY AND VERIFIED WITH: R HENLEY,RN @0402  08/13/17  MKELLY Performed at [facility], [place] [address], [place], [place] [address]   Culture, blood (routine x 2)     Status: None (Preliminary result)   Collection Time: 08/15/17 10:51 AM  Result Value Ref Range Status   Specimen Description   Final    BLOOD LEFT HAND Performed at [facility] [address], [place], [place] [address]    Special Requests   Final    IN PEDIATRIC BOTTLE Blood Culture adequate volume Performed at [facility] [address] [place], [place] [address]    Culture   Final    NO GROWTH < 24 HOURS Performed at [facility] [address] [place], [place] [address]    Report Status PENDING  Incomplete  Culture, blood (routine x 2)     Status: None (Preliminary result)   Collection Time: 08/15/17 10:51 AM  Result Value Ref Range Status   Specimen Description   Final    BLOOD LEFT HAND Performed at [facility] [address], [place], [place] [address]    Special Requests   Final    IN PEDIATRIC BOTTLE Blood Culture adequate volume Performed at [facility] [address], [place], [place] [address]    Culture   Final    NO GROWTH < 24 HOURS Performed at [facility] [address] [place], [place] [address]    Report Status PENDING  Incomplete      Radiology Studies: No results found.    Scheduled Meds: . allopurinol  300 mg Oral Daily  . aspirin EC  81 mg Oral QHS  . Chlorhexidine Gluconate Cloth  6 each Topical Q0600  . cycloSPORINE  1 drop Both Eyes BID  . docusate sodium  100 mg Oral BID  . enoxaparin (LOVENOX) injection  75 mg Subcutaneous Daily  . famotidine  40 mg Oral QHS  .  fluticasone  2 spray Each Nare Daily  . folic acid  4 mg Oral Daily  . gabapentin  800 mg Oral TID  . hydrocortisone sod succinate (SOLU-CORTEF) inj  100 mg Intravenous Q12H  . insulin aspart  0-20 Units Subcutaneous TID WC  . insulin aspart  0-5  Units Subcutaneous QHS  . insulin NPH Human  40 Units Subcutaneous BID AC & HS  . loratadine  10 mg Oral Daily  . mouth rinse  15 mL Mouth Rinse BID  . metoprolol tartrate  25 mg Oral BID  . montelukast  10 mg Oral QHS  . mupirocin ointment  1 application Nasal BID  . pantoprazole  40 mg Oral BID  . sodium chloride flush  10-40 mL Intracatheter Q12H  . sodium chloride flush  3 mL Intravenous Q12H   Continuous Infusions: . cefTRIAXone (ROCEPHIN)  IV Stopped (08/16/17 0940)     LOS: 4 days    Time spent: Total of 25 minutes spent with pt, greater than 50% of which was spent in discussion of  treatment, counseling and coordination of care    Chipper Oman, MD Pager: Text Page via [email or web address]   If 7PM-7AM, please contact night-coverage [email or web address] 08/16/2017, 5:50 PM   Note - This record has been created using [employer]. Chart creation errors have been sought, but may not always have been located. Such creation errors do not reflect on the standard of medical care.

## 2017-08-16 NOTE — Progress Notes (Signed)
Pt transferred to floor via bed with low air loss mattress. Pt denies pain at this time. Pts assessment was reviewed and unchanged from this morning. No s/s of distress noted. Pt given hydration and call bell. Will continue to monitor

## 2017-08-16 NOTE — Consult Note (Signed)
[facility] Nurse wound consult note Reason for Consult:skin breakdown at right posterior thigh, partial thickness.  Moisture associated skin damage. Wound type:Moisture plus friction.  These are not pressure lesions. Pressure Injury POA: NA Measurement: three areas of partial thickness tissue loss, the largest of which measures 1.2cm x 2cm x 0.1cm Wound IRW:ERXV, moist Drainage (amount, consistency, odor) scant serous Periwound: macerated Dressing procedure/placement/frequency: A mattress replacement with low air loss feature for persons of bariatric proportions is in place, also an external powered female urinary incontinence device (PurWick) which has failed twice today, once early this morning and once at the time of this writing.  Nursing staff is adjusting the position of the device.  We are not sure if the patient's voiding pattern (gushing vs constant dribble) is impacting the success of the device. The lesions for which I was consulted are not pressure in origin, but rather moisture associated with friction as an additional skin stressor.  The house moisture barrier ointment along with constant repositioning off of the supine position should expedite resolution of these lesions. The HOB at or below a 30 degree angle will also assist with friction. I have also provided our antimicrobial textile ([employer]+) for use in the patient's subpannicular skin folds where moisture is collection and maceration is noted. Bilateral pressure redistribution heel boots are provided for use while in bed to prevent pressure injury to the heels.  [facility] nursing team will not follow, but will remain available to this patient, the nursing and medical teams.  Please re-consult if needed. Thanks, Maudie Flakes, MSN, RN, [place], [facility]  Pager# [phone]

## 2017-08-17 DIAGNOSIS — D649 Anemia, unspecified: Secondary | ICD-10-CM | POA: Diagnosis present

## 2017-08-17 DIAGNOSIS — K219 Gastro-esophageal reflux disease without esophagitis: Secondary | ICD-10-CM | POA: Diagnosis present

## 2017-08-17 DIAGNOSIS — J449 Chronic obstructive pulmonary disease, unspecified: Secondary | ICD-10-CM | POA: Diagnosis present

## 2017-08-17 DIAGNOSIS — R4182 Altered mental status, unspecified: Secondary | ICD-10-CM | POA: Diagnosis not present

## 2017-08-17 DIAGNOSIS — R6520 Severe sepsis without septic shock: Secondary | ICD-10-CM | POA: Diagnosis not present

## 2017-08-17 DIAGNOSIS — N179 Acute kidney failure, unspecified: Secondary | ICD-10-CM | POA: Diagnosis not present

## 2017-08-17 DIAGNOSIS — R1084 Generalized abdominal pain: Secondary | ICD-10-CM | POA: Diagnosis not present

## 2017-08-17 DIAGNOSIS — R6521 Severe sepsis with septic shock: Secondary | ICD-10-CM | POA: Diagnosis present

## 2017-08-17 DIAGNOSIS — N390 Urinary tract infection, site not specified: Secondary | ICD-10-CM | POA: Diagnosis not present

## 2017-08-17 DIAGNOSIS — Z87891 Personal history of nicotine dependence: Secondary | ICD-10-CM | POA: Diagnosis not present

## 2017-08-17 DIAGNOSIS — M25511 Pain in right shoulder: Secondary | ICD-10-CM | POA: Diagnosis not present

## 2017-08-17 DIAGNOSIS — R2689 Other abnormalities of gait and mobility: Secondary | ICD-10-CM | POA: Diagnosis not present

## 2017-08-17 DIAGNOSIS — Z9181 History of falling: Secondary | ICD-10-CM | POA: Diagnosis not present

## 2017-08-17 DIAGNOSIS — I10 Essential (primary) hypertension: Secondary | ICD-10-CM | POA: Diagnosis present

## 2017-08-17 DIAGNOSIS — M79604 Pain in right leg: Secondary | ICD-10-CM | POA: Diagnosis not present

## 2017-08-17 DIAGNOSIS — N183 Chronic kidney disease, stage 3 (moderate): Secondary | ICD-10-CM | POA: Diagnosis not present

## 2017-08-17 DIAGNOSIS — E872 Acidosis: Secondary | ICD-10-CM | POA: Diagnosis present

## 2017-08-17 DIAGNOSIS — I959 Hypotension, unspecified: Secondary | ICD-10-CM | POA: Diagnosis present

## 2017-08-17 DIAGNOSIS — A4150 Gram-negative sepsis, unspecified: Secondary | ICD-10-CM | POA: Diagnosis not present

## 2017-08-17 DIAGNOSIS — Z794 Long term (current) use of insulin: Secondary | ICD-10-CM | POA: Diagnosis not present

## 2017-08-17 DIAGNOSIS — Z452 Encounter for adjustment and management of vascular access device: Secondary | ICD-10-CM | POA: Diagnosis not present

## 2017-08-17 DIAGNOSIS — E1165 Type 2 diabetes mellitus with hyperglycemia: Secondary | ICD-10-CM | POA: Diagnosis present

## 2017-08-17 DIAGNOSIS — G8929 Other chronic pain: Secondary | ICD-10-CM | POA: Diagnosis present

## 2017-08-17 DIAGNOSIS — G4733 Obstructive sleep apnea (adult) (pediatric): Secondary | ICD-10-CM | POA: Diagnosis present

## 2017-08-17 DIAGNOSIS — F329 Major depressive disorder, single episode, unspecified: Secondary | ICD-10-CM | POA: Diagnosis present

## 2017-08-17 DIAGNOSIS — Z96653 Presence of artificial knee joint, bilateral: Secondary | ICD-10-CM | POA: Diagnosis present

## 2017-08-17 DIAGNOSIS — E1122 Type 2 diabetes mellitus with diabetic chronic kidney disease: Secondary | ICD-10-CM | POA: Diagnosis not present

## 2017-08-17 DIAGNOSIS — E869 Volume depletion, unspecified: Secondary | ICD-10-CM | POA: Diagnosis present

## 2017-08-17 DIAGNOSIS — M1990 Unspecified osteoarthritis, unspecified site: Secondary | ICD-10-CM | POA: Diagnosis present

## 2017-08-17 DIAGNOSIS — Z7982 Long term (current) use of aspirin: Secondary | ICD-10-CM | POA: Diagnosis not present

## 2017-08-17 DIAGNOSIS — R488 Other symbolic dysfunctions: Secondary | ICD-10-CM | POA: Diagnosis not present

## 2017-08-17 DIAGNOSIS — M6281 Muscle weakness (generalized): Secondary | ICD-10-CM | POA: Diagnosis not present

## 2017-08-17 DIAGNOSIS — R51 Headache: Secondary | ICD-10-CM | POA: Diagnosis not present

## 2017-08-17 DIAGNOSIS — G9340 Encephalopathy, unspecified: Secondary | ICD-10-CM | POA: Diagnosis present

## 2017-08-17 DIAGNOSIS — M549 Dorsalgia, unspecified: Secondary | ICD-10-CM | POA: Diagnosis present

## 2017-08-17 DIAGNOSIS — K5900 Constipation, unspecified: Secondary | ICD-10-CM | POA: Diagnosis present

## 2017-08-17 DIAGNOSIS — A419 Sepsis, unspecified organism: Secondary | ICD-10-CM | POA: Diagnosis present

## 2017-08-17 DIAGNOSIS — R1030 Lower abdominal pain, unspecified: Secondary | ICD-10-CM | POA: Diagnosis not present

## 2017-08-17 DIAGNOSIS — E1142 Type 2 diabetes mellitus with diabetic polyneuropathy: Secondary | ICD-10-CM | POA: Diagnosis present

## 2017-08-17 DIAGNOSIS — Z6841 Body Mass Index (BMI) 40.0 and over, adult: Secondary | ICD-10-CM | POA: Diagnosis not present

## 2017-08-17 DIAGNOSIS — I517 Cardiomegaly: Secondary | ICD-10-CM | POA: Diagnosis not present

## 2017-08-17 LAB — BASIC METABOLIC PANEL
Anion gap: 12 (ref 5–15)
BUN: 17 mg/dL (ref 6–20)
CALCIUM: 9.2 mg/dL (ref 8.9–10.3)
CO2: 27 mmol/L (ref 22–32)
CREATININE: 0.94 mg/dL (ref 0.44–1.00)
Chloride: 105 mmol/L (ref 101–111)
GFR calc Af Amer: >60 mL/min (ref >60)
GFR calc non Af Amer: >60 mL/min (ref >60)
GLUCOSE: 61 mg/dL — AB (ref 65–99)
Potassium: 3.1 mmol/L — ABNORMAL LOW (ref 3.5–5.1)
Sodium: 144 mmol/L (ref 135–145)

## 2017-08-17 LAB — GLUCOSE, CAPILLARY
GLUCOSE-CAPILLARY: 91 mg/dL (ref 65–99)
Glucose-Capillary: 63 mg/dL — ABNORMAL LOW (ref 65–99)
Glucose-Capillary: 148 mg/dL — ABNORMAL HIGH (ref 65–99)

## 2017-08-17 MED ORDER — POTASSIUM CHLORIDE CRYS ER 20 MEQ PO TBCR
40.0000 meq | EXTENDED_RELEASE_TABLET | ORAL | Status: DC
  Administered 2017-08-17: 40 meq via ORAL
  Filled 2017-08-17: qty 2

## 2017-08-17 MED ORDER — PREDNISONE 10 MG PO TABS: ORAL_TABLET | ORAL | 0 refills | Status: DC

## 2017-08-17 MED ORDER — CEFPODOXIME PROXETIL 200 MG PO TABS: 200.0000 mg | ORAL_TABLET | Freq: Two times a day (BID) | ORAL | 0 refills | Status: AC

## 2017-08-17 MED ORDER — HYDROCODONE-ACETAMINOPHEN 7.5-325 MG PO TABS: 1.0000 | ORAL_TABLET | Freq: Three times a day (TID) | ORAL | Status: DC | PRN

## 2017-08-17 NOTE — Discharge Summary (Signed)
Physician Discharge Summary  Christy May  XVQ:[id number]  DOB: [birth year]  DOA: 08/12/2017 PCP: Lucianne Lei, MD  Admit date: 08/12/2017 Discharge date: 08/17/2017  Admitted From: Home  Disposition: SNF   Recommendations for Outpatient Follow-up:  1. Follow up with SNF provider at the earliest convenient 2. Please obtain BMP/CBC in one week monitor creatinine, potassium and hemoglobin 3. Taper prednisone down to 20 mg, and continue daily. 4. Please follow up on the following pending results: Pending blood cultures, so far negative  Discharge Condition: Stable CODE STATUS: Full code Diet recommendation: Heart Healthy / Carb Modified   Brief/Interim Summary: For full details see H&P/Progress note, but in brief, Christy May is a 67 year old female with past medical history of hypertension, type 2 diabetes mellitus, morbid obesity and depression presented to the emergency department after mechanical fall.  Patient has significant decline in functional ability for the past 3 months prior to admission.  Patient was discharged from SNF and was unable to walk, subsequently fell multiple times at home.  Upon ED evaluation patient was found to have hypotension, elevated white count and UA grossly abnormal consistent with UTI.  Patient was admitted with working diagnosis of sepsis related to UTI uncomplicated by acute renal injury.  There was a suspicion of adrenal insufficiency for which patient was started on stress dose steroids. Patient was started on empiric antibiotics.   She has clinically improved, remains afebrile.  Blood cultures grew 1/4 coag negative staph suspect contaminant.  Repeated blood cultures are negative so far.  Patient now on taper dose steroids until reach home dose, then continue daily.  E. coli sensitive to ceftriaxone, will change to Vantin to complete 7 days of total treatment.  Renal function was treated with IV fluids and is back to baseline.  In hospital stay  capillary glucose was slightly elevated felt to be secondary to steroids. Patient was evaluated by physical therapist who recommended SNF.  Since patient has clinically improved she was deemed stable for discharge to continue therapy as an outpatient.  Subjective: Patient seen and examined, she has no complaints.  Tolerating diet well.  Overnight was slight hypoglycemic, she reports that did not have good appetite and no p.o. intake last night.  Remains afebrile.  Denies chest pain, shortness of breath and palpitation.  Discharge Diagnoses/Hospital Course:  Sepsis due to urinary tract infection from E. coli, complicated with possible adrenal insufficiency Patient on ceftriaxone, will switch to Vantin to complete 7 days of total therapy Remains afebrile, WBC normalized Blood culture coag negative staph 1/4, suspect contamination.  Repeated blood cultures no growth so far. Patient was on high dose hydrocortisone, will continue taper down to return to prednisone 20 mg which is her home dose.   Acute renal failure - resolved  Felt to be prerenal due to infectious process Was treated with IV fluid, creatinine back to baseline Monitor BMP in 1 week  Type 2 diabetes mellitus CBGs slight elevated due to high-dose steroids Resume home dose insulin, suspect CBGs to normalized with tapering down steroids Continue to monitor glucose.  Hypertension BP stable Initially ARB was held due to AKI, can resume upon discharge Resume metoprolol Continue to monitor BP  Morbid obesity, with ambulatory dysfunction Patient was wheelchair-bound prior to admission, limited mobility and unable to perform most ADLs. PT has been consulted and recommending SNF.    Right buttock pressure ulcer stage II  POA Wound care  All other chronic medical condition were stable during the hospitalization.  Patient was seen by physical therapy, recommending SNF  On the day of the discharge the patient's vitals were  stable, and no other acute medical condition were reported by patient. the patient was felt safe to be discharge to SNF   Discharge Instructions  You were cared for by a hospitalist during your hospital stay. If you have any questions about your discharge medications or the care you received while you were in the hospital after you are discharged, you can call the unit and asked to speak with the hospitalist on call if the hospitalist that took care of you is not available. Once you are discharged, your primary care physician will handle any further medical issues. Please note that NO REFILLS for any discharge medications will be authorized once you are discharged, as it is imperative that you return to your primary care physician (or establish a relationship with a primary care physician if you do not have one) for your aftercare needs so that they can reassess your need for medications and monitor your lab values.  Discharge Instructions    Call MD for:  difficulty breathing, headache or visual disturbances   Complete by:  As directed    Call MD for:  extreme fatigue   Complete by:  As directed    Call MD for:  hives   Complete by:  As directed    Call MD for:  persistant dizziness or light-headedness   Complete by:  As directed    Call MD for:  persistant nausea and vomiting   Complete by:  As directed    Call MD for:  redness, tenderness, or signs of infection (pain, swelling, redness, odor or green/yellow discharge around incision site)   Complete by:  As directed    Call MD for:  severe uncontrolled pain   Complete by:  As directed    Call MD for:  temperature >100.4   Complete by:  As directed    Diet - low sodium heart healthy   Complete by:  As directed    Increase activity slowly   Complete by:  As directed      Allergies as of 08/17/2017      Reactions   Ace Inhibitors Anaphylaxis, Cough   Penicillins Hives, Other (See Comments)   Has patient had a PCN reaction causing  immediate rash, facial/tongue/throat swelling, SOB or lightheadedness with hypotension:  No Has patient had a PCN reaction causing severe rash involving mucus membranes or skin necrosis:  No Has patient had a PCN reaction that required hospitalization No Has patient had a PCN reaction occurring within the last 10 years: No If all of the above answers are "NO", then may proceed with Cephalosporin use.   Adhesive [tape] Hives      Medication List    TAKE these medications   albuterol (2.5 MG/3ML) 0.083% nebulizer solution Commonly known as:  PROVENTIL Take 2.5 mg by nebulization every 6 (six) hours as needed for wheezing or shortness of breath.   allopurinol 300 MG tablet Commonly known as:  ZYLOPRIM Take 300 mg by mouth daily.   aspirin EC 81 MG tablet Take 81 mg by mouth at bedtime.   cefpodoxime 200 MG tablet Commonly known as:  VANTIN Take 1 tablet (200 mg total) by mouth 2 (two) times daily for 3 days.   cholecalciferol 1000 units tablet Commonly known as:  VITAMIN D Take 2,000 Units by mouth daily.   Cinnamon 500 MG capsule Take 1,000 mg by mouth  daily.   cyclobenzaprine 10 MG tablet Commonly known as:  FLEXERIL Take 10 mg by mouth every 8 (eight) hours as needed for muscle spasms.   cycloSPORINE 0.05 % ophthalmic emulsion Commonly known as:  RESTASIS 1 drop 2 (two) times daily.   fluticasone 50 MCG/ACT nasal spray Commonly known as:  FLONASE SHAKE LIQUID AND USE 2 SPRAYS IN EACH NOSTRIL DAILY   folic acid 1 MG tablet Commonly known as:  FOLVITE TAKE 4 TABLETS(4 MG) BY MOUTH DAILY   gabapentin 800 MG tablet Commonly known as:  NEURONTIN Take 1 tablet (800 mg total) by mouth 3 (three) times daily.   glucose blood test strip Commonly known as:  ONETOUCH VERIO Use as instructed to test twice daily   HYDROcodone-acetaminophen 7.5-325 MG tablet Commonly known as:  NORCO Take 1 tablet by mouth 3 (three) times daily as needed.   insulin NPH Human 100 UNIT/ML  injection Commonly known as:  NOVOLIN N RELION Inject 1.2 mLs (120 Units total) into the skin every morning.   INSULIN SYRINGE 1CC/31GX5/16" 31G X 5/16" 1 ML Misc USE TO INJECT INSULIN TWICE DAILY   ipratropium 0.03 % nasal spray Commonly known as:  ATROVENT USE 2 SPRAYS IN EACH NOSTRIL THREE TIMES DAILY AS NEEDED FOR RHINITIS   irbesartan 300 MG tablet Commonly known as:  AVAPRO Take 300 mg by mouth daily.   loratadine 10 MG tablet Commonly known as:  CLARITIN Take 10 mg by mouth daily.   metoprolol tartrate 50 MG tablet Commonly known as:  LOPRESSOR Take 75 mg by mouth 2 (two) times daily.   montelukast 10 MG tablet Commonly known as:  SINGULAIR TAKE 1 TABLET(10 MG) BY MOUTH DAILY   nystatin cream Commonly known as:  MYCOSTATIN Apply 1 application topically 2 (two) times daily as needed for dry skin.   ONE TOUCH LANCETS Misc Use to check sugars twice daily   oxybutynin 10 MG 24 hr tablet Commonly known as:  DITROPAN-XL Take 1 tablet (10 mg total) by mouth daily.   pantoprazole 40 MG tablet Commonly known as:  PROTONIX TAKE 1 TABLET(40 MG) BY MOUTH TWICE DAILY   predniSONE 10 MG tablet Commonly known as:  DELTASONE Take 5 tablets for 3 days; Take 4 tablets for 4 days; Take 3 tablets for 3 days; Take 2 tablet daily. What changed:    medication strength  how much to take  how to take this  when to take this  additional instructions   ranitidine 300 MG tablet Commonly known as:  ZANTAC TAKE 1 TABLET(300 MG) BY MOUTH AT BEDTIME   sodium chloride 0.65 % Soln nasal spray Commonly known as:  OCEAN Place 2 sprays into both nostrils 2 (two) times daily.   traMADol 50 MG tablet Commonly known as:  ULTRAM Take 2 tablets (100 mg total) by mouth every 6 (six) hours as needed. TAKE 1-2 TABLETS EVERY 4 HOURS AS NEED FOR COUGH OR PAIN   Turmeric Curcumin 500 MG Caps Take 1 capsule by mouth daily.   VITAMIN B 12 PO Take 500 mcg by mouth daily.      Contact  information for after-discharge care    Destination    [facility] Follow up.   Service:  Skilled Nursing Contact information: [address] [place] [phone]             Allergies  Allergen Reactions  . Ace Inhibitors Anaphylaxis and Cough  . Penicillins Hives and Other (See Comments)  Has patient had a PCN reaction causing immediate rash, facial/tongue/throat swelling, SOB or lightheadedness with hypotension:  No Has patient had a PCN reaction causing severe rash involving mucus membranes or skin necrosis:  No Has patient had a PCN reaction that required hospitalization No Has patient had a PCN reaction occurring within the last 10 years: No If all of the above answers are "NO", then may proceed with Cephalosporin use.  Dorma Russell [Tape] Hives    Consultations:  None    Procedures/Studies: Dg Chest Port 1 View  Result Date: 08/12/2017 CLINICAL DATA:  Chronic back pain. EXAM: PORTABLE CHEST 1 VIEW COMPARISON:  09/28/2016 FINDINGS: 1829 hours. Low volumes. Cardiopericardial silhouette is at upper limits of normal for size. Basilar atelectasis without pulmonary edema or substantial pleural effusion. The visualized bony structures of the thorax are intact. Telemetry leads overlie the chest. IMPRESSION: No active disease. Electronically Signed   By: Misty Stanley M.D.   On: 08/12/2017 19:16    Discharge Exam: Vitals:   08/16/17 2106 08/17/17 0614  BP: (!) 166/72 (!) 168/82  Pulse: 79 (!) 103  Resp: 19 20  Temp: 98.1 F (36.7 C) 98.2 F (36.8 C)  SpO2: 94% 93%   Vitals:   08/16/17 0625 08/16/17 1340 08/16/17 2106 08/17/17 0614  BP: (!) 152/68 (!) 154/63 (!) 166/72 (!) 168/82  Pulse: 63 67 79 (!) 103  Resp: 12 14 19 20   Temp: 98.1 F (36.7 C) (!) 97.4 F (36.3 C) 98.1 F (36.7 C) 98.2 F (36.8 C)  TempSrc: Oral Oral Oral Oral  SpO2: 97% 98% 94% 93%  Weight:        General: NAD, deconditioned  Cardiovascular:  RRR, S1/S2 +, no rubs, no gallops Respiratory: CTA bilaterally, no wheezing, no rhonchi Abdominal: Obese, Soft, NT, ND, Extremities: no edema. Lower extremity weakness 4/5   The results of significant diagnostics from this hospitalization (including imaging, microbiology, ancillary and laboratory) are listed below for reference.     Microbiology: Recent Results (from the past 240 hour(s))  Blood Culture (routine x 2)     Status: Abnormal   Collection Time: 08/12/17  6:23 PM  Result Value Ref Range Status   Specimen Description   Final    BLOOD RIGHT ANTECUBITAL Performed at [facility] [address], [place], [place] [address]    Special Requests   Final    BOTTLES DRAWN AEROBIC AND ANAEROBIC Blood Culture adequate volume Performed at [facility] [address], [place], [place] [address]    Culture  Setup Time   Final    GRAM POSITIVE COCCI AEROBIC BOTTLE ONLY CRITICAL RESULT CALLED TO, READ BACK BY AND VERIFIED WITH: PHARMD E WILLIAMSON [id number] 1903 MLM    Culture (A)  Final    STAPHYLOCOCCUS SPECIES (COAGULASE NEGATIVE) THE SIGNIFICANCE OF ISOLATING THIS ORGANISM FROM A SINGLE SET OF BLOOD CULTURES WHEN MULTIPLE SETS ARE DRAWN IS UNCERTAIN. PLEASE NOTIFY THE MICROBIOLOGY DEPARTMENT WITHIN ONE WEEK IF SPECIATION AND SENSITIVITIES ARE REQUIRED. Performed at [facility], [place] [address], [place], [place] [address]    Report Status 08/14/2017 FINAL  Final  Blood Culture ID Panel (Reflexed)     Status: Abnormal   Collection Time: 08/12/17  6:23 PM  Result Value Ref Range Status   Enterococcus species NOT DETECTED NOT DETECTED Final   Vancomycin resistance NOT DETECTED NOT DETECTED Final   Listeria monocytogenes NOT DETECTED NOT DETECTED Final   Staphylococcus species DETECTED (A) NOT DETECTED Final  Comment: CRITICAL RESULT CALLED TO, READ BACK BY AND VERIFIED WITH: PHARMD E WILLIAMSON [phone] MLM Methicillin (oxacillin)  susceptible coagulase negative staphylococcus. Possible blood culture contaminant (unless isolated from more than one blood culture draw or clinical case suggests pathogenicity). No antibiotic treatment is indicated for blood  culture contaminants.    Staphylococcus aureus NOT DETECTED NOT DETECTED Final   Methicillin resistance NOT DETECTED NOT DETECTED Final   Streptococcus species NOT DETECTED NOT DETECTED Final   Streptococcus agalactiae NOT DETECTED NOT DETECTED Final   Streptococcus pneumoniae NOT DETECTED NOT DETECTED Final   Streptococcus pyogenes NOT DETECTED NOT DETECTED Final   Acinetobacter baumannii NOT DETECTED NOT DETECTED Final   Enterobacteriaceae species NOT DETECTED NOT DETECTED Final   Enterobacter cloacae complex NOT DETECTED NOT DETECTED Final   Escherichia coli NOT DETECTED NOT DETECTED Final   Klebsiella oxytoca NOT DETECTED NOT DETECTED Final   Klebsiella pneumoniae NOT DETECTED NOT DETECTED Final   Proteus species NOT DETECTED NOT DETECTED Final   Serratia marcescens NOT DETECTED NOT DETECTED Final   Carbapenem resistance NOT DETECTED NOT DETECTED Final   Haemophilus influenzae NOT DETECTED NOT DETECTED Final   Neisseria meningitidis NOT DETECTED NOT DETECTED Final   Pseudomonas aeruginosa NOT DETECTED NOT DETECTED Final   Candida albicans NOT DETECTED NOT DETECTED Final   Candida glabrata NOT DETECTED NOT DETECTED Final   Candida krusei NOT DETECTED NOT DETECTED Final   Candida parapsilosis NOT DETECTED NOT DETECTED Final   Candida tropicalis NOT DETECTED NOT DETECTED Final    Comment: Performed at [facility], [address] [place], [place] [address]  Culture, Urine     Status: Abnormal   Collection Time: 08/12/17  7:30 PM  Result Value Ref Range Status   Specimen Description   Final    URINE, CLEAN CATCH Performed at [facility], [place] [address], [place], [place] [address]    Special Requests   Final    NONE Performed at  [facility], [place] [address] [place], [place] [address]    Culture >=100,000 COLONIES/mL ESCHERICHIA COLI (A)  Final   Report Status 08/15/2017 FINAL  Final   Organism ID, Bacteria ESCHERICHIA COLI (A)  Final      Susceptibility   Escherichia coli - MIC*    AMPICILLIN <=2 SENSITIVE Sensitive     CEFAZOLIN <=4 SENSITIVE Sensitive     CEFTRIAXONE <=1 SENSITIVE Sensitive     CIPROFLOXACIN <=0.25 SENSITIVE Sensitive     GENTAMICIN <=1 SENSITIVE Sensitive     IMIPENEM <=0.25 SENSITIVE Sensitive     NITROFURANTOIN <=16 SENSITIVE Sensitive     TRIMETH/SULFA <=20 SENSITIVE Sensitive     AMPICILLIN/SULBACTAM <=2 SENSITIVE Sensitive     PIP/TAZO <=4 SENSITIVE Sensitive     Extended ESBL NEGATIVE Sensitive     * >=100,000 COLONIES/mL ESCHERICHIA COLI  Blood Culture (routine x 2)     Status: None (Preliminary result)   Collection Time: 08/13/17  1:23 AM  Result Value Ref Range Status   Specimen Description   Final    BLOOD LEFT HAND Performed at [facility] [address], [place], [place] [address]    Special Requests   Final    IN PEDIATRIC BOTTLE Blood Culture adequate volume Performed at [facility] [address], [place], [place] [address]    Culture   Final    NO GROWTH 3 DAYS Performed at [facility], [place],  [place] [address]    Report Status PENDING  Incomplete  MRSA PCR Screening     Status: Abnormal   Collection Time: 08/13/17  2:00 AM  Result Value Ref Range Status   MRSA by PCR POSITIVE (A) NEGATIVE Final    Comment:        The GeneXpert MRSA Assay (FDA approved for NASAL specimens only), is one component of a comprehensive MRSA colonization surveillance program. It is not intended to diagnose MRSA infection nor to guide or monitor treatment for MRSA infections. RESULT CALLED TO, READ BACK BY AND VERIFIED WITH: R HENLEY,RN @0402  08/13/17 MKELLY Performed at [facility] [place] [address], [place], [place] [address]   Culture, blood (routine x 2)     Status: None (Preliminary result)   Collection Time: 08/15/17 10:51 AM  Result Value Ref Range Status   Specimen Description   Final    BLOOD LEFT HAND Performed at [facility] [address], [place], [place] [address]    Special Requests   Final    IN PEDIATRIC BOTTLE Blood Culture adequate volume Performed at [facility] [address], [place], [place] [address]    Culture   Final    NO GROWTH < 24 HOURS Performed at [facility] [address] [place], [place] [address]    Report Status PENDING  Incomplete  Culture, blood (routine x 2)     Status: None (Preliminary result)   Collection Time: 08/15/17 10:51 AM  Result Value Ref Range Status   Specimen Description   Final    BLOOD LEFT HAND Performed at [facility] [address] [place], [place] [address]    Special Requests   Final    IN PEDIATRIC BOTTLE Blood Culture adequate volume Performed at [facility] [address] [address], [place],  [address]    Culture   Final    NO GROWTH < 24 HOURS Performed at [facility] [address], [place],  [address]    Report Status PENDING  Incomplete     Labs: BNP (last 3 results) No results for input(s): BNP in the last 8760 hours. Basic Metabolic Panel: Recent Labs  Lab 08/13/17 0429 08/14/17 0351 08/15/17 0359 08/16/17 0323 08/17/17 0615  NA 139 140 140 140 144  K 3.6 4.1 4.2 3.8 3.1*  CL 102 105 105 106 105  CO2 24 23 24 25 27   GLUCOSE 279* 305* 336* 269* 61*  BUN 24* 22* 23* 27* 17  CREATININE 1.59* 1.07* 1.08* 1.06* 0.94  CALCIUM 9.0 8.6* 8.7* 8.9 9.2   Liver Function Tests: Recent Labs  Lab 08/12/17 1740  AST 20  ALT 21  ALKPHOS 74  BILITOT 0.5  PROT 6.3*  ALBUMIN 2.8*   No results for input(s): LIPASE, AMYLASE in the last 168 hours. No results for  input(s): AMMONIA in the last 168 hours. CBC: Recent Labs  Lab 08/12/17 1740 08/13/17 0429 08/14/17 0351 08/15/17 0359  WBC 12.7* 11.5* 10.7* 8.8  NEUTROABS 10.9*  --  9.1* 7.4  HGB 12.1 12.3 11.4* 10.9*  HCT 38.4 38.4 36.0 34.3*  MCV 91.6 92.3 93.0 92.5  PLT 173 169 143* 157   Cardiac Enzymes: No results for input(s): CKTOTAL, CKMB, CKMBINDEX, TROPONINI in the last 168 hours. BNP: Invalid input(s): POCBNP CBG: Recent Labs  Lab 08/16/17 1658 08/16/17 2104 08/17/17 0748 08/17/17 0826 08/17/17 1126  GLUCAP 222* 166* 63* 91 148*  D-Dimer No results for input(s): DDIMER in the last 72 hours. Hgb A1c No results for input(s): HGBA1C in the last 72 hours. Lipid Profile No results for input(s): CHOL, HDL, LDLCALC, TRIG, CHOLHDL, LDLDIRECT in the last 72 hours. Thyroid function studies No results for input(s): TSH, T4TOTAL, T3FREE, THYROIDAB in the last 72 hours.  Invalid input(s): FREET3 Anemia work up No results for input(s): VITAMINB12, FOLATE, FERRITIN, TIBC, IRON, RETICCTPCT in the last 72 hours. Urinalysis    Component Value Date/Time   COLORURINE YELLOW 08/12/2017 1930   APPEARANCEUR TURBID (A) 08/12/2017 1930   LABSPEC 1.015 08/12/2017 1930   PHURINE 5.0 08/12/2017 1930   GLUCOSEU 50 (A) 08/12/2017 1930   GLUCOSEU NEGATIVE 12/14/2011 1707   HGBUR LARGE (A) 08/12/2017 1930   BILIRUBINUR NEGATIVE 08/12/2017 1930   BILIRUBINUR n 11/16/2014 1515   KETONESUR NEGATIVE 08/12/2017 1930   PROTEINUR 30 (A) 08/12/2017 1930   UROBILINOGEN 0.2 02/27/2015 2226   NITRITE POSITIVE (A) 08/12/2017 1930   LEUKOCYTESUR LARGE (A) 08/12/2017 1930   Sepsis Labs Invalid input(s): PROCALCITONIN,  WBC,  LACTICIDVEN Microbiology Recent Results (from the past 240 hour(s))  Blood Culture (routine x 2)     Status: Abnormal   Collection Time: 08/12/17  6:23 PM  Result Value Ref Range Status   Specimen Description   Final    BLOOD RIGHT ANTECUBITAL Performed at [facility], [place] [address] [place], [place] [address]    Special Requests   Final    BOTTLES DRAWN AEROBIC AND ANAEROBIC Blood Culture adequate volume Performed at [facility] [address], [place], [place] [address]    Culture  Setup Time   Final    GRAM POSITIVE COCCI AEROBIC BOTTLE ONLY CRITICAL RESULT CALLED TO, READ BACK BY AND VERIFIED WITH: PHARMD E WILLIAMSON [id number] 1903 MLM    Culture (A)  Final    STAPHYLOCOCCUS SPECIES (COAGULASE NEGATIVE) THE SIGNIFICANCE OF ISOLATING THIS ORGANISM FROM A SINGLE SET OF BLOOD CULTURES WHEN MULTIPLE SETS ARE DRAWN IS UNCERTAIN. PLEASE NOTIFY THE MICROBIOLOGY DEPARTMENT WITHIN ONE WEEK IF SPECIATION AND SENSITIVITIES ARE REQUIRED. Performed at [facility], [place] [address] [place],  [address]    Report Status 08/14/2017 FINAL  Final  Blood Culture ID Panel (Reflexed)     Status: Abnormal   Collection Time: 08/12/17  6:23 PM  Result Value Ref Range Status   Enterococcus species NOT DETECTED NOT DETECTED Final   Vancomycin resistance NOT DETECTED NOT DETECTED Final   Listeria monocytogenes NOT DETECTED NOT DETECTED Final   Staphylococcus species DETECTED (A) NOT DETECTED Final    Comment: CRITICAL RESULT CALLED TO, READ BACK BY AND VERIFIED WITH: PHARMD E WILLIAMSON [id number] 1903 MLM Methicillin (oxacillin) susceptible coagulase negative staphylococcus. Possible blood culture contaminant (unless isolated from more than one blood culture draw or clinical case suggests pathogenicity). No antibiotic treatment is indicated for blood  culture contaminants.    Staphylococcus aureus NOT DETECTED NOT DETECTED Final   Methicillin resistance NOT DETECTED NOT DETECTED Final   Streptococcus species NOT DETECTED NOT DETECTED Final   Streptococcus agalactiae NOT DETECTED NOT DETECTED Final   Streptococcus pneumoniae NOT DETECTED NOT DETECTED Final   Streptococcus pyogenes NOT DETECTED NOT DETECTED Final    Acinetobacter baumannii NOT DETECTED NOT DETECTED Final   Enterobacteriaceae species NOT DETECTED NOT DETECTED Final   Enterobacter cloacae complex NOT DETECTED NOT DETECTED Final   Escherichia coli NOT DETECTED NOT DETECTED Final   Klebsiella oxytoca NOT DETECTED  NOT DETECTED Final   Klebsiella pneumoniae NOT DETECTED NOT DETECTED Final   Proteus species NOT DETECTED NOT DETECTED Final   Serratia marcescens NOT DETECTED NOT DETECTED Final   Carbapenem resistance NOT DETECTED NOT DETECTED Final   Haemophilus influenzae NOT DETECTED NOT DETECTED Final   Neisseria meningitidis NOT DETECTED NOT DETECTED Final   Pseudomonas aeruginosa NOT DETECTED NOT DETECTED Final   Candida albicans NOT DETECTED NOT DETECTED Final   Candida glabrata NOT DETECTED NOT DETECTED Final   Candida krusei NOT DETECTED NOT DETECTED Final   Candida parapsilosis NOT DETECTED NOT DETECTED Final   Candida tropicalis NOT DETECTED NOT DETECTED Final    Comment: Performed at [facility], [place] [address] [place], [place] [address]  Culture, Urine     Status: Abnormal   Collection Time: 08/12/17  7:30 PM  Result Value Ref Range Status   Specimen Description   Final    URINE, CLEAN CATCH Performed at [facility], [place] [address], [place], [place] [address]    Special Requests   Final    NONE Performed at [facility], [place] [address] [place], [place] [address]    Culture >=100,000 COLONIES/mL ESCHERICHIA COLI (A)  Final   Report Status 08/15/2017 FINAL  Final   Organism ID, Bacteria ESCHERICHIA COLI (A)  Final      Susceptibility   Escherichia coli - MIC*    AMPICILLIN <=2 SENSITIVE Sensitive     CEFAZOLIN <=4 SENSITIVE Sensitive     CEFTRIAXONE <=1 SENSITIVE Sensitive     CIPROFLOXACIN <=0.25 SENSITIVE Sensitive     GENTAMICIN <=1 SENSITIVE Sensitive     IMIPENEM <=0.25 SENSITIVE Sensitive     NITROFURANTOIN <=16 SENSITIVE Sensitive     TRIMETH/SULFA <=20 SENSITIVE  Sensitive     AMPICILLIN/SULBACTAM <=2 SENSITIVE Sensitive     PIP/TAZO <=4 SENSITIVE Sensitive     Extended ESBL NEGATIVE Sensitive     * >=100,000 COLONIES/mL ESCHERICHIA COLI  Blood Culture (routine x 2)     Status: None (Preliminary result)   Collection Time: 08/13/17  1:23 AM  Result Value Ref Range Status   Specimen Description   Final    BLOOD LEFT HAND Performed at [place] [address], [place], [place] [address]    Special Requests   Final    IN PEDIATRIC BOTTLE Blood Culture adequate volume Performed at [place] [address], [place], [place] [address]    Culture   Final    NO GROWTH 3 DAYS Performed at [facility], [place] [address] [place], [place] [address]    Report Status PENDING  Incomplete  MRSA PCR Screening     Status: Abnormal   Collection Time: 08/13/17  2:00 AM  Result Value Ref Range Status   MRSA by PCR POSITIVE (A) NEGATIVE Final    Comment:        The GeneXpert MRSA Assay (FDA approved for NASAL specimens only), is one component of a comprehensive MRSA colonization surveillance program. It is not intended to diagnose MRSA infection nor to guide or monitor treatment for MRSA infections. RESULT CALLED TO, READ BACK BY AND VERIFIED WITH: R HENLEY,RN @0402  08/13/17 MKELLY Performed at [facility], [place] [address] [place], [place] [address]   Culture, blood (routine x 2)     Status: None (Preliminary result)   Collection Time: 08/15/17 10:51 AM  Result Value Ref Range Status   Specimen Description  Final    BLOOD LEFT HAND Performed at [facility], [place] [address], [place], [place] [address]    Special Requests   Final    IN PEDIATRIC BOTTLE Blood Culture adequate volume Performed at [place] [address] [place], [place] [address]    Culture   Final    NO GROWTH < 24 HOURS Performed at [place]  [address], [place], [place] [address]    Report Status PENDING  Incomplete  Culture, blood (routine x 2)     Status: None (Preliminary result)   Collection Time: 08/15/17 10:51 AM  Result Value Ref Range Status   Specimen Description   Final    BLOOD LEFT HAND Performed at [place] [address] [place], [place] [address]    Special Requests   Final    IN PEDIATRIC BOTTLE Blood Culture adequate volume Performed at [place] [address] [place], [place] [address]    Culture   Final    NO GROWTH < 24 HOURS Performed at [place] [address], [place],  [address]    Report Status PENDING  Incomplete     Time coordinating discharge: 32 minutes  SIGNED:  Chipper Oman, MD  [facility] 08/17/2017, 12:46 PM  Pager please text page via  [email or web address]  Note - This record has been created using [employer]. Chart creation errors have been sought, but may not always have been located. Such creation errors do not reflect on the standard of medical care.

## 2017-08-17 NOTE — NC FL2 (Signed)
[place] MEDICAID FL2 LEVEL OF CARE SCREENING TOOL     IDENTIFICATION  Patient Name: Christy May Birthdate: [birth year] Sex: female Admission Date (Current Location): 08/12/2017  [facility] and [place] Number:  Herbalist and Address:  [facility],  [place], [place]      Provider Number: [id number]  Attending Physician Name and Address:  Patrecia Pour, Christean Grief, MD  Relative Name and Phone Number:       Current Level of Care: Hospital Recommended Level of Care: [place] Prior Approval Number:    Date Approved/Denied: 08/17/17 PASRR Number: [id number]  Discharge Plan: SNF    Current Diagnoses: Patient Active Problem List   Diagnosis Date Noted  . Acute renal failure (ARF) ([place]) 08/13/2017  . Pressure injury of skin 08/13/2017  . Sepsis due to gram-negative UTI ([place]) 08/12/2017  . Upper airway cough syndrome 09/28/2016  . Acute bronchitis 10/29/2015  . Diabetes ([place]) 08/21/2015  . Chest pain 07/14/2015  . Dyspnea on exertion 07/05/2015  . Obstructive sleep apnea 07/05/2015  . Constipation 03/05/2015  . Allergic rhinitis 03/05/2015  . Hearing loss 03/05/2015  . Vocal cord dysfunction 03/05/2015  . Pain in joint, shoulder region 09/11/2014  . Weakness generalized 09/02/2014  . Acute esophagitis 05/08/2014  . Benign neoplasm of sigmoid colon 05/08/2014  . Change in bowel habits 04/17/2014  . Fecal incontinence 04/17/2014  . Urge incontinence 04/08/2014  . Cough 12/06/2013  . Chronic pain syndrome 08/24/2013  . Orthostasis 07/18/2013  . Acute kidney injury ([place]) 07/18/2013  . Bilateral carpal tunnel syndrome 02/14/2013  . Gout 01/04/2013  . Failure to thrive 06/28/2012  . Gait disorder 06/28/2012  . Preventative health care 08/23/2011  . Cervical radiculopathy 02/11/2011  . Low back pain 02/11/2011  . VITAMIN D DEFICIENCY 11/22/2009  . ANEMIA-NOS 11/22/2009  . DEGENERATIVE JOINT DISEASE, CERVICAL  SPINE 11/22/2009  . Depression 09/16/2009  . POLYNEUROPATHY 09/16/2009  . Morbid obesity ([place]) 09/09/2009  . Essential hypertension 09/09/2009  . GERD 09/09/2009  . Sleep apnea 09/09/2009    Orientation RESPIRATION BLADDER Height & Weight     Self, Time, Situation, Place  Normal External catheter, Continent Weight: (!) 416 lb (188.7 kg)(per patients bed) Height:     BEHAVIORAL SYMPTOMS/MOOD NEUROLOGICAL BOWEL NUTRITION STATUS      Continent Diet(See dc Summary)  AMBULATORY STATUS COMMUNICATION OF NEEDS Skin   Extensive Assist   PU Stage and Appropriate Care(Right, Buttocks posterior)   PU Stage 2 Dressing: (PRN)                   Personal Care Assistance Level of Assistance  Bathing, Feeding, Dressing Bathing Assistance: Maximum assistance Feeding assistance: Limited assistance Dressing Assistance: Maximum assistance     Functional Limitations Info  Sight, Hearing, Speech Sight Info: Adequate Hearing Info: Adequate Speech Info: Adequate    SPECIAL CARE FACTORS FREQUENCY  PT (By licensed PT), OT (By licensed OT)     PT Frequency: 5x/week OT Frequency: 5x/week            Contractures Contractures Info: Not present    Additional Factors Info  Code Status, Allergies Code Status Info: Full Allergies Info: Ace Inhibitors, Penicillins, Adhesive Tape           Current Medications (08/17/2017):  This is the current hospital active medication list Current Facility-Administered Medications  Medication Dose Route Frequency Provider Last Rate Last Dose  . acetaminophen (TYLENOL) tablet 650 mg  650 mg Oral Q6H  PRN Karmen Bongo, MD       Or  . acetaminophen (TYLENOL) suppository 650 mg  650 mg Rectal Q6H PRN Karmen Bongo, MD      . albuterol (PROVENTIL) (2.5 MG/3ML) 0.083% nebulizer solution 2.5 mg  2.5 mg Nebulization Q2H PRN Karmen Bongo, MD      . allopurinol (ZYLOPRIM) tablet 300 mg  300 mg Oral Daily Karmen Bongo, MD   300 mg at 08/17/17 2505  .  aspirin EC tablet 81 mg  81 mg Oral Ivery Quale, MD   81 mg at 08/16/17 2300  . cefTRIAXone (ROCEPHIN) 2 g in sodium chloride 0.9 % 100 mL IVPB  2 g Intravenous Q24H Karmen Bongo, MD   Stopped at 08/17/17 [phone]  . cyclobenzaprine (FLEXERIL) tablet 10 mg  10 mg Oral Q8H PRN Karmen Bongo, MD   10 mg at 08/15/17 1551  . cycloSPORINE (RESTASIS) 0.05 % ophthalmic emulsion 1 drop  1 drop Both Eyes BID Karmen Bongo, MD   1 drop at 08/17/17 0915  . docusate sodium (COLACE) capsule 100 mg  100 mg Oral BID Karmen Bongo, MD   100 mg at 08/17/17 0916  . enoxaparin (LOVENOX) injection 75 mg  75 mg Subcutaneous Daily Karmen Bongo, MD   75 mg at 08/17/17 0916  . famotidine (PEPCID) tablet 40 mg  40 mg Oral Ivery Quale, MD   40 mg at 08/15/17 2117  . fluticasone (FLONASE) 50 MCG/ACT nasal spray 2 spray  2 spray Each Nare Daily Karmen Bongo, MD   2 spray at 08/17/17 [phone]  . folic acid (FOLVITE) tablet 4 mg  4 mg Oral Daily Karmen Bongo, MD   4 mg at 08/17/17 0916  . gabapentin (NEURONTIN) capsule 800 mg  800 mg Oral TID Karmen Bongo, MD   800 mg at 08/17/17 0917  . HYDROcodone-acetaminophen (NORCO) 7.5-325 MG per tablet 1 tablet  1 tablet Oral TID PRN Karmen Bongo, MD   1 tablet at 08/17/17 0618  . hydrocortisone sodium succinate (SOLU-CORTEF) 100 MG injection 100 mg  100 mg Intravenous Q12H Arrien, Jimmy Picket, MD   100 mg at 08/17/17 9379  . insulin aspart (novoLOG) injection 0-20 Units  0-20 Units Subcutaneous TID WC Karmen Bongo, MD   7 Units at 08/16/17 1719  . insulin aspart (novoLOG) injection 0-5 Units  0-5 Units Subcutaneous QHS Karmen Bongo, MD   3 Units at 08/15/17 2250  . insulin NPH Human (HUMULIN N,NOVOLIN N) injection 45 Units  45 Units Subcutaneous BID AC & HS Patrecia Pour, Christean Grief, MD   45 Units at 08/16/17 2200  . loratadine (CLARITIN) tablet 10 mg  10 mg Oral Daily Karmen Bongo, MD   10 mg at 08/17/17 0917  . MEDLINE mouth rinse  15 mL Mouth  Rinse BID Arrien, Jimmy Picket, MD   15 mL at 08/17/17 0917  . metoprolol tartrate (LOPRESSOR) tablet 25 mg  25 mg Oral BID Tawni Millers, MD   25 mg at 08/17/17 0916  . montelukast (SINGULAIR) tablet 10 mg  10 mg Oral Ivery Quale, MD   10 mg at 08/15/17 2118  . mupirocin ointment (BACTROBAN) 2 % 1 application  1 application Nasal BID Arrien, Jimmy Picket, MD   1 application at 02/40/97 [phone]  . ondansetron (ZOFRAN) tablet 4 mg  4 mg Oral Q6H PRN Karmen Bongo, MD       Or  . ondansetron [facility]) injection 4 mg  4 mg Intravenous Q6H PRN Karmen Bongo, MD  4 mg at 08/13/17 2128  . pantoprazole (PROTONIX) EC tablet 40 mg  40 mg Oral BID Karmen Bongo, MD   40 mg at 08/17/17 8889  . potassium chloride SA (K-DUR,KLOR-CON) CR tablet 40 mEq  40 mEq Oral Q4H Patrecia Pour, Christean Grief, MD   40 mEq at 08/17/17 [phone]  . sodium chloride (OCEAN) 0.65 % nasal spray 1 spray  1 spray Each Nare PRN Arrien, Jimmy Picket, MD   1 spray at 08/15/17 1317  . sodium chloride flush (NS) 0.9 % injection 10-40 mL  10-40 mL Intracatheter Q12H Arrien, Jimmy Picket, MD   10 mL at 08/15/17 2308  . sodium chloride flush (NS) 0.9 % injection 10-40 mL  10-40 mL Intracatheter PRN Arrien, Jimmy Picket, MD      . sodium chloride flush (NS) 0.9 % injection 3 mL  3 mL Intravenous Q12H Karmen Bongo, MD   3 mL at 08/15/17 2308  . traMADol (ULTRAM) tablet 100 mg  100 mg Oral Q6H PRN Karmen Bongo, MD   100 mg at 08/14/17 5038     Discharge Medications: Please see discharge summary for a list of discharge medications.  Relevant Imaging Results:  Relevant Lab Results:   Additional Information SSN: [id number]  Servando Snare, LCSW

## 2017-08-17 NOTE — Progress Notes (Signed)
Physical Therapy Treatment Patient Details Name: Christy May MRN: [id number] DOB: [birth year] Today's Date: 08/17/2017    History of Present Illness 67 y.o. female with medical history significant of HTN; DM; morbid obesity (BMI 57.50); and depression presenting because she is unable to care for herself at home.  Patient went on Thanksgiving to [facility] with her brother and sister who live in [place].  When she went to come home, her legs gave out and were hurting.  They took her to the hospital in [place], [place].  Her daughter brought her home about a week later.  She went into rehab at [facility].  She caught a cold and bronchitis from a roommate.  She started falling and kept falling.  She fell on New Year's Day and several other times.  She was released from there about 2 weeks ago and she was supposed to have [facility] coming to her house for 2 months.  She did not know she had to call them and so she was home for over a week by herself.   She was unable to move in her house and so stayed in a lift chair. Pt admitted with UTI, sepsis.     PT Comments    Patient limited to in bed tx due to toileting issues and soiled with urine and feces in bed.  Assisted with hygiene and educated pt to call for assistance if needed for cleaning or toileting.  Patient verbalized understanding.  Will need SNF level rehab at d/c and pt reports transport scheduled for this pm.     Follow Up Recommendations  SNF;Supervision/Assistance - 24 hour     Equipment Recommendations  None recommended by PT    Recommendations for Other Services       Precautions / Restrictions Precautions Precautions: Fall Precaution Comments: multiple falls in past 2 months    Mobility  Bed Mobility Overal bed mobility: Needs Assistance Bed Mobility: Rolling Rolling: Mod assist         General bed mobility comments: assist to roll for hygiene due to bed wet and no one assisted for cleaning  today; EOB deferred due to time for hygiene and plans to d/c to SNF today  Transfers                    Ambulation/Gait                 Stairs            Wheelchair Mobility    Modified Rankin (Stroke Patients Only)       Balance                                            Cognition Arousal/Alertness: Awake/alert Behavior During Therapy: WFL for tasks assessed/performed Overall Cognitive Status: Within Functional Limits for tasks assessed                                        Exercises Other Exercises Other Exercises: multiple LE movements during hygiene and rolling including knee and hip flexion and extension and pushing through leg to assist with turning and scooting up in bed    General Comments General comments (skin integrity, edema, etc.): soiled with urine with pure wick malplaced  and suction not on , also soiled with feces in bed,  as cleansed noted areas of excoriation in perineal area.  RN made aware, placed clean pure wick and applied suction      Pertinent Vitals/Pain Pain Assessment: Faces Faces Pain Scale: Hurts even more Pain Location: buttocks and R thigh Pain Descriptors / Indicators: Aching;Sharp Pain Intervention(s): Monitored during session;Repositioned    Home Living                      Prior Function            PT Goals (current goals can now be found in the care plan section) Progress towards PT goals: Not progressing toward goals - comment(limited to bed level tx due to soiled)    Frequency           PT Plan Current plan remains appropriate    Co-evaluation              AM-PAC PT "6 Clicks" Daily Activity  Outcome Measure  Difficulty turning over in bed (including adjusting bedclothes, sheets and blankets)?: Unable Difficulty moving from lying on back to sitting on the side of the bed? : Unable Difficulty sitting down on and standing up from a chair with  arms (e.g., wheelchair, bedside commode, etc,.)?: Unable Help needed moving to and from a bed to chair (including a wheelchair)?: Total Help needed walking in hospital room?: Total Help needed climbing 3-5 steps with a railing? : Total 6 Click Score: 6    End of Session   Activity Tolerance: Patient tolerated treatment well Patient left: in bed;with call bell/phone within reach;with family/visitor present Nurse Communication: Other (comment)(pt was soiled in bed and had not been cleaned ) PT Visit Diagnosis: Repeated falls (R29.6);History of falling (Z91.81);Difficulty in walking, not elsewhere classified (R26.2);Muscle weakness (generalized) (M62.81);Adult, failure to thrive (R62.7)     Time: 2595-6387 PT Time Calculation (min) (ACUTE ONLY): 39 min  Charges:  $Therapeutic Activity: 38-52 mins                    G CodesMagda Kiel, [place] [phone] 08/17/2017    Reginia Naas 08/17/2017, 4:24 PM

## 2017-08-17 NOTE — Progress Notes (Signed)
Inpatient Diabetes Program Recommendations  AACE/ADA: New Consensus Statement on Inpatient Glycemic Control (2015)  Target Ranges:  Prepandial:   less than 140 mg/dL      Peak postprandial:   less than 180 mg/dL (1-2 hours)      Critically ill patients:  140 - 180 mg/dL   Results for Christy May, Christy May (MRN [id number]) as of 08/17/2017 10:27  Ref. Range 08/16/2017 07:28 08/16/2017 11:34 08/16/2017 16:58 08/16/2017 21:04  Glucose-Capillary Latest Ref Range: 65 - 99 mg/dL 218 (H)  7 units Novolog +  40 units NPH 274 (H)  11 units Novolog 222 (H)  7 units Novolog 166 (H)     45 units NPH   Results for Christy May, Christy May (MRN [id number]) as of 08/17/2017 10:27  Ref. Range 08/17/2017 07:48  Glucose-Capillary Latest Ref Range: 65 - 99 mg/dL 63 (L)    Home DM Meds: NPH 120 units QAM  Endocrinologist: Dr. Einar Crow  Current Orders: NPH 45 units BID                            Novolog Resistant Correction Scale/ SSI (0-20 units) TID AC + HS      Note patient getting Solucortef 100 BID.  CBGs elevated likely due to steroids.  Mild Hypoglycemic event this AM (CBG 63 mg/dl)    MD- Please consider the following in-hospital insulin adjustments:  1. Reduce NPH slightly to 42 units BID  2. Reduce Novolog SSI to Moderate scale (0-15 units) TID AC + HS  3. Start Novolog Meal Coverage: Novolog 6 units TID with meals (hold if pt eats <50% of meal)     --Will follow patient during hospitalization--  [name] RN, MSN, CDE Diabetes Coordinator Inpatient Glycemic Control Team Team Pager: [phone] (8a-5p)

## 2017-08-17 NOTE — Clinical Social Work Placement (Signed)
    12:31 PM Patient chose bed at [facility].   LCSW confirmed bed with facility.   Patient will transport by PTAR.   LCSW left message for brother, BJ. LCSW attempted to reach patient's daughter, [name], number incorrect and friend Lovey Newcomer, no answer.    RN report #: [phone] Please add any hard scripts to patient packet. Packet on patient chart.   BKJ   CLINICAL SOCIAL WORK PLACEMENT  NOTE  Date:  08/17/2017  Patient Details  Name: Christy May MRN: [id number] Date of Birth: 28-Jan-1951  Clinical Social Work is seeking post-discharge placement for this patient at the [place] level of care (*CSW will initial, date and re-position this form in  chart as items are completed):  Yes   Patient/family provided with [place] Work Department's list of facilities offering this level of care within the geographic area requested by the patient (or if unable, by the patient's family).  Yes   Patient/family informed of their freedom to choose among providers that offer the needed level of care, that participate in Medicare, Medicaid or managed care program needed by the patient, have an available bed and are willing to accept the patient.  Yes   Patient/family informed of [facility]'s ownership interest in [facility] and [facility], as well as of the fact that they are under no obligation to receive care at these facilities.  PASRR submitted to EDS on       PASRR number received on 08/17/17     Existing PASRR number confirmed on 08/17/17     FL2 transmitted to all facilities in geographic area requested by pt/family on 08/17/17     FL2 transmitted to all facilities within larger geographic area on       Patient informed that his/her managed care company has contracts with or will negotiate with certain facilities, including the following:        Yes   Patient/family informed of bed offers received.  Patient chooses bed at  [facility]     Physician recommends and patient chooses bed at      Patient to be transferred to [facility] on 08/17/17.  Patient to be transferred to facility by EMS     Patient family notified on 08/17/17 of transfer.  Name of family member notified:  Left message for Brother, BJ     PHYSICIAN Please sign FL2, Please prepare priority discharge summary, including medications     Additional Comment:    _______________________________________________ Servando Snare, LCSW 08/17/2017, 12:31 PM

## 2017-08-17 NOTE — Clinical Social Work Placement (Signed)
Pt discharged with plan to admit to [facility] for rehab- room #706-P, report [phone].  Informed pt's brother- agreeable to plan. Discussed with both pt and her brother than she is in copay days (medicare covering 80% of cost of SNF as she has been at SNF 20+ days during past 60 days). Both agreeable to copay. Arranged PTAR transport DC information provided via the [facility]. See below for placement information   CLINICAL SOCIAL WORK PLACEMENT  NOTE  Date:  08/17/2017  Patient Details  Name: Christy May MRN: [id number] Date of Birth: Oct 10, 1950  Clinical Social Work is seeking post-discharge placement for this patient at the [place] level of care (*CSW will initial, date and re-position this form in  chart as items are completed):  Yes   Patient/family provided with [place] Work Department's list of facilities offering this level of care within the geographic area requested by the patient (or if unable, by the patient's family).  Yes   Patient/family informed of their freedom to choose among providers that offer the needed level of care, that participate in Medicare, Medicaid or managed care program needed by the patient, have an available bed and are willing to accept the patient.  Yes   Patient/family informed of [facility]'s ownership interest in [facility] and [facility], as well as of the fact that they are under no obligation to receive care at these facilities.  PASRR submitted to EDS on       PASRR number received on 08/17/17     Existing PASRR number confirmed on 08/17/17     FL2 transmitted to all facilities in geographic area requested by pt/family on 08/17/17     FL2 transmitted to all facilities within larger geographic area on       Patient informed that his/her managed care company has contracts with or will negotiate with certain facilities, including the following:        Yes   Patient/family informed of bed  offers received.  Patient chooses bed at [facility]     Physician recommends and patient chooses bed at      Patient to be transferred to [facility] on 08/17/17.  Patient to be transferred to facility by EMS     Patient family notified on 08/17/17 of transfer.  Name of family member notified:  Left message for Brother, BJ     PHYSICIAN Please sign FL2, Please prepare priority discharge summary, including medications     Additional Comment:    _______________________________________________ Nila Nephew, LCSW 08/17/2017, 2:17 PM

## 2017-08-17 NOTE — Consult Note (Signed)
   [facility] CM Inpatient Consult   08/17/2017  Christy May Jun 05, 1951 [id number]    [facility] Care Management follow up.   [name] discharged to [facility] today 08/17/17.   Will update [facility] team.    Marthenia Rolling, [place], RN,BSN [facility] Liaison [phone]

## 2017-08-17 NOTE — Progress Notes (Signed)
LCSW following for SNF placement.  Patient was assessed by ED CSW on 2/21.   Patient is agreeable to SNF.   Patient was previously at [facility] and prefers a different facility.  Patient stated that she feels she was not ready to dc from SNF when they let her go. Patient reported that she was using a Rolator at home and  fell going in to her home the day of discharge. Patient reported she also fell getting off the toilet when her Rolator did not lock.   After dc from SNF patient reports that she was in a lifting recliner. She was able to pivot and use the bedside commode and get herself back to the recliner.   Patient stated that she recently received a hospital bed. Once she was in the bed patient reports not being able to get herself out of the bed or roll over in the bed. Patient reports that friends were coming by to help her with meals and cleaning her up.   PLAN: patient will go to SNF at DC.   Carolin Coy Garner [place]

## 2017-08-18 ENCOUNTER — Telehealth: Payer: Self-pay | Admitting: Pharmacist

## 2017-08-18 DIAGNOSIS — M79604 Pain in right leg: Secondary | ICD-10-CM | POA: Diagnosis not present

## 2017-08-18 DIAGNOSIS — M25511 Pain in right shoulder: Secondary | ICD-10-CM | POA: Diagnosis not present

## 2017-08-18 DIAGNOSIS — M6281 Muscle weakness (generalized): Secondary | ICD-10-CM | POA: Diagnosis not present

## 2017-08-18 DIAGNOSIS — Z9181 History of falling: Secondary | ICD-10-CM | POA: Diagnosis not present

## 2017-08-18 LAB — CULTURE, BLOOD (ROUTINE X 2)
Culture: NO GROWTH
Special Requests: ADEQUATE

## 2017-08-18 NOTE — Patient Outreach (Signed)
[facility]) Care Management  08/18/2017  Christy May [birth year] [id number]   Patient was called for follow up. HIPAA identifiers were obtained. Patient is in [facility]  Patient reported she was feeling "ok" and is looking forward to being discharged back home.  Plan: Close patient's case and follow up with her when she gets out of the SNF.  Elayne Guerin, PharmD, [place] Clinical Pharmacist [phone]

## 2017-08-20 ENCOUNTER — Ambulatory Visit: Payer: Self-pay | Admitting: Pharmacist

## 2017-08-20 ENCOUNTER — Ambulatory Visit: Payer: Self-pay | Admitting: *Deleted

## 2017-08-20 ENCOUNTER — Telehealth: Payer: Self-pay | Admitting: Endocrinology

## 2017-08-20 DIAGNOSIS — M6281 Muscle weakness (generalized): Secondary | ICD-10-CM | POA: Diagnosis not present

## 2017-08-20 DIAGNOSIS — M79604 Pain in right leg: Secondary | ICD-10-CM | POA: Diagnosis not present

## 2017-08-20 DIAGNOSIS — M25511 Pain in right shoulder: Secondary | ICD-10-CM | POA: Diagnosis not present

## 2017-08-20 DIAGNOSIS — Z9181 History of falling: Secondary | ICD-10-CM | POA: Diagnosis not present

## 2017-08-20 LAB — CULTURE, BLOOD (ROUTINE X 2)
CULTURE: NO GROWTH
Culture: NO GROWTH
SPECIAL REQUESTS: ADEQUATE
Special Requests: ADEQUATE

## 2017-08-20 NOTE — Telephone Encounter (Signed)
[facility] [employer] office needs ICD #10 code for [employer]. Please call ph# [phone] to give code

## 2017-08-23 ENCOUNTER — Encounter: Payer: Self-pay | Admitting: Licensed Clinical Social Worker

## 2017-08-23 ENCOUNTER — Other Ambulatory Visit: Payer: Self-pay | Admitting: Licensed Clinical Social Worker

## 2017-08-23 MED ORDER — GLUCOSE BLOOD VI STRP: ORAL_STRIP | 3 refills | Status: DC

## 2017-08-23 MED ORDER — ONETOUCH LANCETS MISC: 3 refills | Status: DC

## 2017-08-23 NOTE — Telephone Encounter (Signed)
Resent RX's with DX code

## 2017-08-23 NOTE — Patient Outreach (Signed)
[facility]) Care Management  [place] Social Work  08/23/2017  Christy May 1950/11/14 [id number]  Subjective:    Objective:   Encounter Medications:  Outpatient Encounter Medications as of 08/23/2017  Medication Sig Note  . albuterol (PROVENTIL) (2.5 MG/3ML) 0.083% nebulizer solution Take 2.5 mg by nebulization every 6 (six) hours as needed for wheezing or shortness of breath.   . allopurinol (ZYLOPRIM) 300 MG tablet Take 300 mg by mouth daily.   Marland Kitchen aspirin EC 81 MG tablet Take 81 mg by mouth at bedtime.   . cholecalciferol (VITAMIN D) 1000 UNITS tablet Take 2,000 Units by mouth daily.    . Cinnamon 500 MG capsule Take 1,000 mg by mouth daily.   . Cyanocobalamin (VITAMIN B 12 PO) Take 500 mcg by mouth daily.   . cyclobenzaprine (FLEXERIL) 10 MG tablet Take 10 mg by mouth every 8 (eight) hours as needed for muscle spasms.   . cycloSPORINE (RESTASIS) 0.05 % ophthalmic emulsion 1 drop 2 (two) times daily.   . fluticasone (FLONASE) 50 MCG/ACT nasal spray SHAKE LIQUID AND USE 2 SPRAYS IN EACH NOSTRIL DAILY   . folic acid (FOLVITE) 1 MG tablet TAKE 4 TABLETS(4 MG) BY MOUTH DAILY 04/06/2017: Out of  . gabapentin (NEURONTIN) 800 MG tablet Take 1 tablet (800 mg total) by mouth 3 (three) times daily.   Marland Kitchen glucose blood (ONETOUCH VERIO) test strip Use as instructed to test twice daily   . HYDROcodone-acetaminophen (NORCO) 7.5-325 MG tablet Take 1 tablet by mouth 3 (three) times daily as needed.   . insulin NPH Human (NOVOLIN N RELION) 100 UNIT/ML injection Inject 1.2 mLs (120 Units total) into the skin every morning. 08/12/2017: 110 UNITS  . Insulin Syringe-Needle U-100 (INSULIN SYRINGE 1CC/31GX5/16") 31G X 5/16" 1 ML MISC USE TO INJECT INSULIN TWICE DAILY   . ipratropium (ATROVENT) 0.03 % nasal spray USE 2 SPRAYS IN EACH NOSTRIL THREE TIMES DAILY AS NEEDED FOR RHINITIS   . irbesartan (AVAPRO) 300 MG tablet Take 300 mg by mouth daily.   Marland Kitchen loratadine (CLARITIN) 10 MG tablet Take 10  mg by mouth daily.   . metoprolol (LOPRESSOR) 50 MG tablet Take 75 mg by mouth 2 (two) times daily.   . montelukast (SINGULAIR) 10 MG tablet TAKE 1 TABLET(10 MG) BY MOUTH DAILY   . nystatin cream (MYCOSTATIN) Apply 1 application topically 2 (two) times daily as needed for dry skin.   . ONE TOUCH LANCETS MISC Use to check sugars twice daily   . oxybutynin (DITROPAN-XL) 10 MG 24 hr tablet Take 1 tablet (10 mg total) by mouth daily.   . pantoprazole (PROTONIX) 40 MG tablet TAKE 1 TABLET(40 MG) BY MOUTH TWICE DAILY   . predniSONE (DELTASONE) 10 MG tablet Take 5 tablets for 3 days; Take 4 tablets for 4 days; Take 3 tablets for 3 days; Take 2 tablet daily.   . ranitidine (ZANTAC) 300 MG tablet TAKE 1 TABLET(300 MG) BY MOUTH AT BEDTIME   . sodium chloride (OCEAN) 0.65 % SOLN nasal spray Place 2 sprays into both nostrils 2 (two) times daily.   . traMADol (ULTRAM) 50 MG tablet Take 2 tablets (100 mg total) by mouth every 6 (six) hours as needed. TAKE 1-2 TABLETS EVERY 4 HOURS AS NEED FOR COUGH OR PAIN   . Turmeric Curcumin 500 MG CAPS Take 1 capsule by mouth daily.    No facility-administered encounter medications on file as of 08/23/2017.     Functional Status:  In your present state  of health, do you have any difficulty performing the following activities: 08/23/2017 08/12/2017  Hearing? Christy May  Vision? N N  Difficulty concentrating or making decisions? N N  Walking or climbing stairs? Y Y  Dressing or bathing? N N  Doing errands, shopping? Christy May  Preparing Food and eating ? Y -  Using the Toilet? N -  In the past six months, have you accidently leaked urine? N -  Do you have problems with loss of bowel control? N -  Managing your Medications? Y -  Managing your Finances? Y -  Housekeeping or managing your Housekeeping? Y -  Some recent data might be hidden    Fall/Depression Screening:  PHQ 2/9 Scores 06/24/2017 03/02/2017 11/24/2016 11/11/2016 01/25/2015 04/19/2014 04/19/2014  PHQ - 2 Score 0 0 0 0 0 0  1    Assessment:    CSW received referral on Christene R. [facility] Silos. CSW completed chart review on client  on 08/23/17.  CSW traveled to [facility] facility on 08/23/17 to meet with client. CSW met with client on 08/23/17 at client's room at [facility] facility. CSW introduced self to client and CSW gave client [facility] CSW card.  CSW verified client identity. CSW received verbal permission from client on 3//4/19 for CSW to speak with client about client needs and status. Client is receiving nursing  care at [facility] facility. Client is also receiving physical therapy support as scheduled at [facility] facility.  Client uses wheelchair to assist client with ambulation.  CSW spoke with client  about [facility] progam support in nursing, social work and pharmacy.  Client fatigues easily with exertion.  Client is not mentioning any pain issues at present.  Client is at risk for falls and she is glad she is receiving. physical therapy sessions at present.  CSW and client completed needed [facility] client assessments. CSW spoke with client about client care plan. CSW encouraged client to participate in scheduled client physical therapy sessions for client in next 30 days at nursing facility.  Client had been living alone at her home.  She sees Dr. Criss Rosales as her primary care doctor. She said she had communicated previously with Dr. Denyse Amass, [facility] pharmacist.  Client was not sure how long she would stay at nursing facility; she did say that it is important for her to participate actively in her weekly physical therapy sessions. CSW encouraged client to call CSW at [phone] as needed to discuss social work needs of client. CSW thanked Pansy for allowing CSW to visit with her at her room at [facility] facility on 08/23/17. Client was appreciative of visit by CSW on 08/23/17.   Plan:   Client to participate in scheduled client physical therapy sessions for client in next 30 days at nursing facility.  CSW to  call client in 3 weeks to assess client needs and status at that time.  [name].Tonia Avino MSW, LCSW Licensed Clinical Social Worker [facility] Care Management [phone]

## 2017-08-25 ENCOUNTER — Telehealth: Payer: Self-pay | Admitting: Emergency Medicine

## 2017-08-25 DIAGNOSIS — M25511 Pain in right shoulder: Secondary | ICD-10-CM | POA: Diagnosis not present

## 2017-08-25 DIAGNOSIS — M79604 Pain in right leg: Secondary | ICD-10-CM | POA: Diagnosis not present

## 2017-08-25 DIAGNOSIS — Z9181 History of falling: Secondary | ICD-10-CM | POA: Diagnosis not present

## 2017-08-25 DIAGNOSIS — M6281 Muscle weakness (generalized): Secondary | ICD-10-CM | POA: Diagnosis not present

## 2017-08-25 NOTE — Telephone Encounter (Signed)
Thank you :)

## 2017-08-25 NOTE — Telephone Encounter (Signed)
Pt states that she was recently hospitalized at [facility], has been transferred to [facility].  Pt states that she will call to schedule a HFU after she is released from the nursing home.  FYI RB.

## 2017-08-26 ENCOUNTER — Other Ambulatory Visit: Payer: Self-pay | Admitting: *Deleted

## 2017-08-26 NOTE — Patient Outreach (Signed)
[facility]) Care Management  08/26/2017  Christy May 06/16/1951 [id number]   Member has been admitted to acute care facility (hospital and now SNF) for greater than 10 days.  Will close to nursing and have LCSW place new referral at [place].  [name], [place], MSN [place] [phone]

## 2017-08-27 ENCOUNTER — Ambulatory Visit: Payer: Self-pay | Admitting: Endocrinology

## 2017-08-27 DIAGNOSIS — M79604 Pain in right leg: Secondary | ICD-10-CM | POA: Diagnosis not present

## 2017-08-27 DIAGNOSIS — M25511 Pain in right shoulder: Secondary | ICD-10-CM | POA: Diagnosis not present

## 2017-08-27 DIAGNOSIS — Z9181 History of falling: Secondary | ICD-10-CM | POA: Diagnosis not present

## 2017-08-27 DIAGNOSIS — M6281 Muscle weakness (generalized): Secondary | ICD-10-CM | POA: Diagnosis not present

## 2017-08-30 ENCOUNTER — Other Ambulatory Visit: Payer: Self-pay

## 2017-08-30 ENCOUNTER — Emergency Department (HOSPITAL_COMMUNITY): Payer: Medicare Other

## 2017-08-30 ENCOUNTER — Encounter (HOSPITAL_COMMUNITY): Payer: Self-pay | Admitting: Emergency Medicine

## 2017-08-30 ENCOUNTER — Inpatient Hospital Stay (HOSPITAL_COMMUNITY)
Admission: EM | Admit: 2017-08-30 | Discharge: 2017-09-03 | DRG: 871 | Disposition: A | Discharge status: Skilled Nursing Facility | Payer: Medicare Other | Attending: Internal Medicine | Admitting: Internal Medicine

## 2017-08-30 DIAGNOSIS — G9340 Encephalopathy, unspecified: Secondary | ICD-10-CM | POA: Diagnosis present

## 2017-08-30 DIAGNOSIS — R488 Other symbolic dysfunctions: Secondary | ICD-10-CM | POA: Diagnosis not present

## 2017-08-30 DIAGNOSIS — N390 Urinary tract infection, site not specified: Secondary | ICD-10-CM | POA: Diagnosis not present

## 2017-08-30 DIAGNOSIS — R6521 Severe sepsis with septic shock: Secondary | ICD-10-CM | POA: Diagnosis present

## 2017-08-30 DIAGNOSIS — R2681 Unsteadiness on feet: Secondary | ICD-10-CM | POA: Diagnosis not present

## 2017-08-30 DIAGNOSIS — G8929 Other chronic pain: Secondary | ICD-10-CM | POA: Diagnosis present

## 2017-08-30 DIAGNOSIS — Z96653 Presence of artificial knee joint, bilateral: Secondary | ICD-10-CM | POA: Diagnosis present

## 2017-08-30 DIAGNOSIS — Z87891 Personal history of nicotine dependence: Secondary | ICD-10-CM | POA: Diagnosis not present

## 2017-08-30 DIAGNOSIS — R1030 Lower abdominal pain, unspecified: Secondary | ICD-10-CM

## 2017-08-30 DIAGNOSIS — J449 Chronic obstructive pulmonary disease, unspecified: Secondary | ICD-10-CM | POA: Diagnosis present

## 2017-08-30 DIAGNOSIS — Z888 Allergy status to other drugs, medicaments and biological substances status: Secondary | ICD-10-CM

## 2017-08-30 DIAGNOSIS — Z6841 Body Mass Index (BMI) 40.0 and over, adult: Secondary | ICD-10-CM | POA: Diagnosis not present

## 2017-08-30 DIAGNOSIS — I10 Essential (primary) hypertension: Secondary | ICD-10-CM | POA: Diagnosis present

## 2017-08-30 DIAGNOSIS — Z79899 Other long term (current) drug therapy: Secondary | ICD-10-CM

## 2017-08-30 DIAGNOSIS — R2689 Other abnormalities of gait and mobility: Secondary | ICD-10-CM | POA: Diagnosis not present

## 2017-08-30 DIAGNOSIS — Z794 Long term (current) use of insulin: Secondary | ICD-10-CM

## 2017-08-30 DIAGNOSIS — N183 Chronic kidney disease, stage 3 unspecified: Secondary | ICD-10-CM

## 2017-08-30 DIAGNOSIS — E872 Acidosis: Secondary | ICD-10-CM | POA: Diagnosis present

## 2017-08-30 DIAGNOSIS — R278 Other lack of coordination: Secondary | ICD-10-CM | POA: Diagnosis not present

## 2017-08-30 DIAGNOSIS — E1142 Type 2 diabetes mellitus with diabetic polyneuropathy: Secondary | ICD-10-CM | POA: Diagnosis present

## 2017-08-30 DIAGNOSIS — E869 Volume depletion, unspecified: Secondary | ICD-10-CM | POA: Diagnosis present

## 2017-08-30 DIAGNOSIS — I959 Hypotension, unspecified: Secondary | ICD-10-CM | POA: Diagnosis present

## 2017-08-30 DIAGNOSIS — K219 Gastro-esophageal reflux disease without esophagitis: Secondary | ICD-10-CM | POA: Diagnosis present

## 2017-08-30 DIAGNOSIS — N179 Acute kidney failure, unspecified: Secondary | ICD-10-CM | POA: Diagnosis not present

## 2017-08-30 DIAGNOSIS — F329 Major depressive disorder, single episode, unspecified: Secondary | ICD-10-CM | POA: Diagnosis present

## 2017-08-30 DIAGNOSIS — G4733 Obstructive sleep apnea (adult) (pediatric): Secondary | ICD-10-CM | POA: Diagnosis present

## 2017-08-30 DIAGNOSIS — M6281 Muscle weakness (generalized): Secondary | ICD-10-CM | POA: Diagnosis not present

## 2017-08-30 DIAGNOSIS — M549 Dorsalgia, unspecified: Secondary | ICD-10-CM | POA: Diagnosis present

## 2017-08-30 DIAGNOSIS — I517 Cardiomegaly: Secondary | ICD-10-CM | POA: Diagnosis not present

## 2017-08-30 DIAGNOSIS — Z7982 Long term (current) use of aspirin: Secondary | ICD-10-CM | POA: Diagnosis not present

## 2017-08-30 DIAGNOSIS — M1990 Unspecified osteoarthritis, unspecified site: Secondary | ICD-10-CM | POA: Diagnosis present

## 2017-08-30 DIAGNOSIS — Z981 Arthrodesis status: Secondary | ICD-10-CM

## 2017-08-30 DIAGNOSIS — E1122 Type 2 diabetes mellitus with diabetic chronic kidney disease: Secondary | ICD-10-CM | POA: Diagnosis not present

## 2017-08-30 DIAGNOSIS — K5900 Constipation, unspecified: Secondary | ICD-10-CM | POA: Diagnosis present

## 2017-08-30 DIAGNOSIS — E1165 Type 2 diabetes mellitus with hyperglycemia: Secondary | ICD-10-CM | POA: Diagnosis present

## 2017-08-30 DIAGNOSIS — R51 Headache: Secondary | ICD-10-CM | POA: Diagnosis not present

## 2017-08-30 DIAGNOSIS — Z880 Allergy status to penicillin: Secondary | ICD-10-CM

## 2017-08-30 DIAGNOSIS — R4182 Altered mental status, unspecified: Secondary | ICD-10-CM | POA: Diagnosis not present

## 2017-08-30 DIAGNOSIS — A419 Sepsis, unspecified organism: Principal | ICD-10-CM | POA: Diagnosis present

## 2017-08-30 DIAGNOSIS — Z452 Encounter for adjustment and management of vascular access device: Secondary | ICD-10-CM | POA: Diagnosis not present

## 2017-08-30 DIAGNOSIS — D649 Anemia, unspecified: Secondary | ICD-10-CM | POA: Diagnosis present

## 2017-08-30 DIAGNOSIS — Z91048 Other nonmedicinal substance allergy status: Secondary | ICD-10-CM

## 2017-08-30 DIAGNOSIS — E118 Type 2 diabetes mellitus with unspecified complications: Secondary | ICD-10-CM | POA: Diagnosis not present

## 2017-08-30 DIAGNOSIS — R1084 Generalized abdominal pain: Secondary | ICD-10-CM | POA: Diagnosis not present

## 2017-08-30 HISTORY — DX: Severe sepsis with septic shock: R65.21

## 2017-08-30 HISTORY — DX: Sepsis, unspecified organism: A41.9

## 2017-08-30 LAB — URINALYSIS, ROUTINE W REFLEX MICROSCOPIC
BILIRUBIN URINE: NEGATIVE
Glucose, UA: 50 mg/dL — AB
Hgb urine dipstick: NEGATIVE
Ketones, ur: NEGATIVE mg/dL
Leukocytes, UA: NEGATIVE
NITRITE: NEGATIVE
PROTEIN: NEGATIVE mg/dL
Specific Gravity, Urine: 1.013 (ref 1.005–1.030)
pH: 5 (ref 5.0–8.0)

## 2017-08-30 LAB — I-STAT ARTERIAL BLOOD GAS, ED
ACID-BASE DEFICIT: 7 mmol/L — AB (ref 0.0–2.0)
Bicarbonate: 20.6 mmol/L (ref 20.0–28.0)
O2 Saturation: 97 %
PH ART: 7.243 — AB (ref 7.350–7.450)
PO2 ART: 105 mmHg (ref 83.0–108.0)
Patient temperature: 98.6
TCO2: 22 mmol/L (ref 22–32)
pCO2 arterial: 47.8 mmHg (ref 32.0–48.0)

## 2017-08-30 LAB — COMPREHENSIVE METABOLIC PANEL
ALK PHOS: 83 U/L (ref 38–126)
ALT: 24 U/L (ref 14–54)
ANION GAP: 16 — AB (ref 5–15)
AST: 30 U/L (ref 15–41)
Albumin: 3.1 g/dL — ABNORMAL LOW (ref 3.5–5.0)
BUN: 39 mg/dL — ABNORMAL HIGH (ref 6–20)
CALCIUM: 9.2 mg/dL (ref 8.9–10.3)
CHLORIDE: 98 mmol/L — AB (ref 101–111)
CO2: 22 mmol/L (ref 22–32)
CREATININE: 2.11 mg/dL — AB (ref 0.44–1.00)
GFR, EST AFRICAN AMERICAN: 27 mL/min — AB (ref >60)
GFR, EST NON AFRICAN AMERICAN: 23 mL/min — AB (ref >60)
Glucose, Bld: 238 mg/dL — ABNORMAL HIGH (ref 65–99)
Potassium: 5.3 mmol/L — ABNORMAL HIGH (ref 3.5–5.1)
Sodium: 136 mmol/L (ref 135–145)
Total Bilirubin: 0.8 mg/dL (ref 0.3–1.2)
Total Protein: 6.8 g/dL (ref 6.5–8.1)

## 2017-08-30 LAB — CBC WITH DIFFERENTIAL/PLATELET
BASOS ABS: 0 10*3/uL (ref 0.0–0.1)
Basophils Relative: 0 %
EOS ABS: 0.1 10*3/uL (ref 0.0–0.7)
Eosinophils Relative: 1 %
HCT: 46.4 % — ABNORMAL HIGH (ref 36.0–46.0)
Hemoglobin: 14.8 g/dL (ref 12.0–15.0)
LYMPHS ABS: 0.7 10*3/uL (ref 0.7–4.0)
Lymphocytes Relative: 5 %
MCH: 29.1 pg (ref 26.0–34.0)
MCHC: 31.9 g/dL (ref 30.0–36.0)
MCV: 91.3 fL (ref 78.0–100.0)
Monocytes Absolute: 0.3 10*3/uL (ref 0.1–1.0)
Monocytes Relative: 2 %
NEUTROS ABS: 12.6 10*3/uL — AB (ref 1.7–7.7)
Neutrophils Relative %: 92 %
PLATELETS: ADEQUATE 10*3/uL (ref 150–400)
RBC: 5.08 MIL/uL (ref 3.87–5.11)
RDW: 14.9 % (ref 11.5–15.5)
Smear Review: ADEQUATE
WBC: 13.7 10*3/uL — AB (ref 4.0–10.5)

## 2017-08-30 LAB — PROTIME-INR
INR: 0.98
PROTHROMBIN TIME: 12.9 s (ref 11.4–15.2)

## 2017-08-30 LAB — TROPONIN I

## 2017-08-30 LAB — I-STAT CG4 LACTIC ACID, ED
LACTIC ACID, VENOUS: 4.68 mmol/L — AB (ref 0.5–1.9)
Lactic Acid, Venous: 2.44 mmol/L (ref 0.5–1.9)

## 2017-08-30 LAB — I-STAT VENOUS BLOOD GAS, ED
Acid-base deficit: 5 mmol/L — ABNORMAL HIGH (ref 0.0–2.0)
Bicarbonate: 22.1 mmol/L (ref 20.0–28.0)
O2 SAT: 78 %
PH VEN: 7.262 (ref 7.250–7.430)
TCO2: 24 mmol/L (ref 22–32)
pCO2, Ven: 49.1 mmHg (ref 44.0–60.0)
pO2, Ven: 49 mmHg — ABNORMAL HIGH (ref 32.0–45.0)

## 2017-08-30 LAB — LACTIC ACID, PLASMA
LACTIC ACID, VENOUS: 1.8 mmol/L (ref 0.5–1.9)
LACTIC ACID, VENOUS: 1.2 mmol/L (ref 0.5–1.9)

## 2017-08-30 LAB — CBC
HEMATOCRIT: 39.5 % (ref 36.0–46.0)
HEMOGLOBIN: 12.4 g/dL (ref 12.0–15.0)
MCH: 29.5 pg (ref 26.0–34.0)
MCHC: 31.4 g/dL (ref 30.0–36.0)
MCV: 93.8 fL (ref 78.0–100.0)
PLATELETS: 210 10*3/uL (ref 150–400)
RBC: 4.21 MIL/uL (ref 3.87–5.11)
RDW: 15.2 % (ref 11.5–15.5)
WBC: 16.1 10*3/uL — AB (ref 4.0–10.5)

## 2017-08-30 LAB — CREATININE, SERUM
Creatinine, Ser: 1.83 mg/dL — ABNORMAL HIGH (ref 0.44–1.00)
GFR calc non Af Amer: 27 mL/min — ABNORMAL LOW (ref >60)
GFR, EST AFRICAN AMERICAN: 32 mL/min — AB (ref >60)

## 2017-08-30 LAB — GLUCOSE, CAPILLARY: GLUCOSE-CAPILLARY: 196 mg/dL — AB (ref 65–99)

## 2017-08-30 LAB — ABO/RH: ABO/RH(D): B POS

## 2017-08-30 LAB — CORTISOL: Cortisol, Plasma: 12.6 ug/dL

## 2017-08-30 LAB — APTT: APTT: 29 s (ref 24–36)

## 2017-08-30 LAB — PROCALCITONIN: Procalcitonin: 1.91 ng/mL

## 2017-08-30 MED ORDER — VANCOMYCIN HCL 10 G IV SOLR
2500.0000 mg | Freq: Once | INTRAVENOUS | Status: AC
  Administered 2017-08-30: 2500 mg via INTRAVENOUS
  Filled 2017-08-30: qty 2500

## 2017-08-30 MED ORDER — SODIUM CHLORIDE 0.9 % IV BOLUS (SEPSIS)
1000.0000 mL | Freq: Once | INTRAVENOUS | Status: AC
  Administered 2017-08-30: 1000 mL via INTRAVENOUS

## 2017-08-30 MED ORDER — ENOXAPARIN SODIUM 30 MG/0.3ML ~~LOC~~ SOLN
30.0000 mg | SUBCUTANEOUS | Status: DC
  Administered 2017-08-30: 30 mg via SUBCUTANEOUS
  Filled 2017-08-30: qty 0.3

## 2017-08-30 MED ORDER — FAMOTIDINE IN NACL 20-0.9 MG/50ML-% IV SOLN
20.0000 mg | Freq: Two times a day (BID) | INTRAVENOUS | Status: DC
  Administered 2017-08-30 – 2017-09-02 (×7): 20 mg via INTRAVENOUS
  Filled 2017-08-30 (×7): qty 50

## 2017-08-30 MED ORDER — SODIUM CHLORIDE 0.9 % IV SOLN
2.0000 g | INTRAVENOUS | Status: DC
  Administered 2017-08-31 – 2017-09-01 (×2): 2 g via INTRAVENOUS
  Filled 2017-08-30 (×3): qty 2

## 2017-08-30 MED ORDER — VANCOMYCIN HCL 10 G IV SOLR
2000.0000 mg | INTRAVENOUS | Status: DC
  Filled 2017-08-30: qty 2000

## 2017-08-30 MED ORDER — LACTATED RINGERS IV SOLN
INTRAVENOUS | Status: DC
  Administered 2017-08-30 – 2017-09-02 (×5): via INTRAVENOUS

## 2017-08-30 MED ORDER — SODIUM CHLORIDE 0.9% FLUSH: 10.0000 mL | INTRAVENOUS | Status: DC | PRN

## 2017-08-30 MED ORDER — ORAL CARE MOUTH RINSE
15.0000 mL | Freq: Two times a day (BID) | OROMUCOSAL | Status: DC
  Administered 2017-08-31 – 2017-09-03 (×7): 15 mL via OROMUCOSAL

## 2017-08-30 MED ORDER — SODIUM CHLORIDE 0.9 % IV SOLN
2.0000 g | Freq: Once | INTRAVENOUS | Status: AC
  Administered 2017-08-30: 2 g via INTRAVENOUS
  Filled 2017-08-30: qty 2

## 2017-08-30 MED ORDER — NOREPINEPHRINE BITARTRATE 1 MG/ML IV SOLN
5.0000 ug/min | INTRAVENOUS | Status: DC
  Filled 2017-08-30: qty 4

## 2017-08-30 NOTE — ED Notes (Signed)
Multiple attempts to get IV line placement by this RN, IV team and MD using ultrasound. All attempts at this point unsuccessful.

## 2017-08-30 NOTE — Progress Notes (Signed)
Pharmacy Antibiotic Note  IRIDIANA FONNER is a 67 y.o. female admitted on 08/30/2017 with sepsis.  Pharmacy has been consulted for vancomycin and cefepime dosing.  SCr 2.11, normalized CrCl ~60ml/min.  Plan: Start cefepime 2g IV Q24h Give vancomycin 2.5g IV x 1, then start vancomycin 2g IV Q48h Monitor clinical picture, renal function, VT prn F/U C&S, abx deescalation / LOT  Height: 5\' 4"  (162.6 cm) Weight: (!) 416 lb (188.7 kg) IBW/kg (Calculated) : 54.7  Temp (24hrs), Avg:98.5 F (36.9 C), Min:98.5 F (36.9 C), Max:98.5 F (36.9 C)  Recent Labs  Lab 08/30/17 1354 08/30/17 1404  CREATININE 2.11*  --   LATICACIDVEN  --  4.68*    Estimated Creatinine Clearance: 44.2 mL/min (A) (by C-G formula based on SCr of 2.11 mg/dL (H)).    Allergies  Allergen Reactions  . Ace Inhibitors Anaphylaxis and Cough  . Penicillins Hives and Other (See Comments)    Tolerated ceftriaxone in 2019  Has patient had a PCN reaction causing immediate rash, facial/tongue/throat swelling, SOB or lightheadedness with hypotension:  No Has patient had a PCN reaction causing severe rash involving mucus membranes or skin necrosis:  No Has patient had a PCN reaction that required hospitalization No Has patient had a PCN reaction occurring within the last 10 years: No If all of the above answers are "NO", then may proceed with Cephalosporin use.  . Adhesive [Tape] Hives    Thank you for allowing pharmacy to be a part of this patient's care.  Reginia Naas 08/30/2017 3:09 PM

## 2017-08-30 NOTE — ED Notes (Addendum)
Family at bedside.  Pt is more alert.  She reports feeling a little better.

## 2017-08-30 NOTE — ED Provider Notes (Signed)
[facility] EMERGENCY DEPARTMENT Provider Note   CSN: [id number] Arrival date & time: 08/30/17  1238     History   Chief Complaint Chief Complaint  Patient presents with  . Abdominal Pain    HPI Christy May is a 67 y.o. female.  HPI   Level 5 caveat due to altered mental status.  Christy May is a 67 y.o. female, with a history of urosepsis, presenting to the ED with abdominal pain beginning 3 days ago.  Patient arrived via EMS from [place] care due to altered mental status.  Patient is unable to rate or describe the pain.  Also endorses dysuria. Patient was admitted on August 12, 2017 for sepsis secondary to UTI.  She also presented with confusion at that time. Patient's daughter is at the bedside and provides some of the information.  Denies diarrhea, constipation, vomiting, shortness of breath, chest pain, or any other complaints.  Past Medical History:  Diagnosis Date  . Allergic bronchitis   . Allergy   . Anemia, unspecified   . Anxiety   . Arthritis    "neck, back, hands" (09/03/2014)  . Chronic airway obstruction, not elsewhere classified    "I was told I don't have this/tests done @ [facility] 05/2014"  . Chronic back pain   . Colon polyps   . Complication of anesthesia    "I've had recall; I probably stopped breathing at least 2 times" (09/03/2014)  . Dehydration   . Depressive disorder, not elsewhere classified   . Diabetic peripheral neuropathy ([place])   . Dysphagia 2007   historyof dysphagia with severe dysmotility by barium swallow-[name]  . Esophageal reflux   . Extrinsic asthma, unspecified    "I was told I don't have this/tests done @ [facility] 05/2014"  . Family history of adverse reaction to anesthesia    "my brother was told they lost him a couple times during OR"  . Headache    "weekly to monthly" (09/03/2014)  . Heart murmur   . History of hiatal hernia   . Inflammatory and toxic neuropathy, unspecified    . Migraine    "couple times/month right now" (09/03/2014)  . Obesity, unspecified   . OSA (obstructive sleep apnea)     CPAP  . Spondylosis of unspecified site without mention of myelopathy   . Type II diabetes mellitus ([place])   . Unspecified essential hypertension   . Unspecified menopausal and postmenopausal disorder   . Unspecified vitamin D deficiency     Patient Active Problem List   Diagnosis Date Noted  . Acute renal failure (ARF) ([place]) 08/13/2017  . Pressure injury of skin 08/13/2017  . Sepsis due to gram-negative UTI ([place]) 08/12/2017  . Upper airway cough syndrome 09/28/2016  . Acute bronchitis 10/29/2015  . Diabetes ([place]) 08/21/2015  . Chest pain 07/14/2015  . Dyspnea on exertion 07/05/2015  . Obstructive sleep apnea 07/05/2015  . Constipation 03/05/2015  . Allergic rhinitis 03/05/2015  . Hearing loss 03/05/2015  . Vocal cord dysfunction 03/05/2015  . Pain in joint, shoulder region 09/11/2014  . Weakness generalized 09/02/2014  . Acute esophagitis 05/08/2014  . Benign neoplasm of sigmoid colon 05/08/2014  . Change in bowel habits 04/17/2014  . Fecal incontinence 04/17/2014  . Urge incontinence 04/08/2014  . Cough 12/06/2013  . Chronic pain syndrome 08/24/2013  . Orthostasis 07/18/2013  . Acute kidney injury ([place]) 07/18/2013  . Bilateral carpal tunnel syndrome 02/14/2013  . Gout 01/04/2013  . Failure to thrive  06/28/2012  . Gait disorder 06/28/2012  . Preventative health care 08/23/2011  . Cervical radiculopathy 02/11/2011  . Low back pain 02/11/2011  . VITAMIN D DEFICIENCY 11/22/2009  . ANEMIA-NOS 11/22/2009  . DEGENERATIVE JOINT DISEASE, CERVICAL SPINE 11/22/2009  . Depression 09/16/2009  . POLYNEUROPATHY 09/16/2009  . Morbid obesity ([place]) 09/09/2009  . Essential hypertension 09/09/2009  . GERD 09/09/2009  . Sleep apnea 09/09/2009    Past Surgical History:  Procedure Laterality Date  . ABDOMINAL HYSTERECTOMY     "partial"  . ANTERIOR CERVICAL  DECOMP/DISCECTOMY FUSION     multiple cervical spine levels;Dr. Arnoldo Morale  . APPENDECTOMY    . BACK SURGERY    . CHOLECYSTECTOMY OPEN    . COLONOSCOPY N/A 05/08/2014   Procedure: COLONOSCOPY;  Surgeon: [name], MD;  Location: [facility] END[facility];  Service: Endoscopy;  Laterality: N/A;  . DILATION AND CURETTAGE OF UTERUS    . ESOPHAGOGASTRODUODENOSCOPY N/A 05/08/2014   Procedure: ESOPHAGOGASTRODUODENOSCOPY (EGD);  Surgeon: [name], MD;  Location: [name];  Service: Endoscopy;  Laterality: N/A;  . EXPLORATORY LAPAROTOMY    . EYE SURGERY    . FOOT SURGERY Right X 2   "took blood out of my arms and put platelets in my feet"  . INCISION AND DRAINAGE ABSCESS     Chest  . JOINT REPLACEMENT    . REFRACTIVE SURGERY Bilateral   . TONSILLECTOMY    . TOTAL KNEE ARTHROPLASTY Left 10/2003  . TOTAL KNEE ARTHROPLASTY Right 01/2004   [name]    OB History    No data available       Home Medications    Prior to Admission medications   Medication Sig Start Date End Date Taking? Authorizing Provider  albuterol (PROVENTIL) (2.5 MG/3ML) 0.083% nebulizer solution Take 2.5 mg by nebulization 4 (four) times daily as needed for wheezing or shortness of breath.    Yes [provider]  allopurinol (ZYLOPRIM) 300 MG tablet Take 300 mg by mouth daily.   Yes [provider]  aspirin EC 81 MG tablet Take 81 mg by mouth at bedtime.   Yes [provider]  Cholecalciferol (VITAMIN D-3) 1000 units CAPS Take 2,000 Units by mouth daily.   Yes [provider]  Cobalamine Combinations (VITAMIN B12-FOLIC ACID PO) Take 1 tablet by mouth daily.   Yes [provider]  cyclobenzaprine (FLEXERIL) 10 MG tablet Take 10 mg by mouth every 8 (eight) hours as needed for muscle spasms. 08/02/17  Yes [provider]  cycloSPORINE (RESTASIS) 0.05 % ophthalmic emulsion Place 1 drop into both eyes 2 (two) times daily.    Yes [provider]  fluticasone (FLONASE)  50 MCG/ACT nasal spray SHAKE LIQUID AND USE 2 SPRAYS IN EACH NOSTRIL DAILY 03/08/17  Yes Byrum, Rose Fillers, MD  folic acid (FOLVITE) 1 MG tablet TAKE 4 TABLETS(4 MG) BY MOUTH DAILY Patient taking differently: Take 1 mg by mouth once a day 05/17/16  Yes Renato Shin, MD  gabapentin (NEURONTIN) 800 MG tablet Take 1 tablet (800 mg total) by mouth 3 (three) times daily. 02/27/15  Yes Biagio Borg, MD  HYDROcodone-acetaminophen (NORCO) 7.5-325 MG tablet Take 1 tablet by mouth 3 (three) times daily as needed (for pain).  08/08/17  Yes [provider]  insulin aspart (NOVOLOG) 100 UNIT/ML injection Inject 0-12 Units into the skin See admin instructions. Inject 0-12 units into the skin before meals & at bedtime, per sliding scale: BGL <70 = call MD; 70-200 = 0 units;  201-250 = 2 units; 251-300 = 4 units; 301-350 = 6 units; 351-400 = 8 units; 401-450 = 10 units; >450 = 12 units   Yes [provider]  insulin NPH Human (NOVOLIN N RELION) 100 UNIT/ML injection Inject 1.2 mLs (120 Units total) into the skin every morning. 03/09/17  Yes Renato Shin, MD  ipratropium (ATROVENT) 0.03 % nasal spray USE 2 SPRAYS IN EACH NOSTRIL THREE TIMES DAILY AS NEEDED FOR RHINITIS 03/17/17  Yes Collene Gobble, MD  irbesartan (AVAPRO) 300 MG tablet Take 300 mg by mouth daily.   Yes [provider]  Lidocaine-Menthol (LIMENCIN) 4-4 % PTCH Apply 1 patch topically See admin instructions. Apply 1 patch daily to the right shoulder/remove each evening   Yes [provider]  loratadine (CLARITIN) 10 MG tablet Take 10 mg by mouth daily.   Yes [provider]  metoprolol (LOPRESSOR) 50 MG tablet Take 75 mg by mouth 2 (two) times daily.   Yes [provider]  montelukast (SINGULAIR) 10 MG tablet TAKE 1 TABLET(10 MG) BY MOUTH DAILY 04/03/16  Yes Magdalen Spatz, NP  Multiple Vitamins-Minerals (MULTIVITAMIN WITH MINERALS) tablet Take 1 tablet by mouth daily.   Yes [provider]    oxybutynin (DITROPAN-XL) 10 MG 24 hr tablet Take 1 tablet (10 mg total) by mouth daily. 02/18/15  Yes Biagio Borg, MD  pantoprazole (PROTONIX) 40 MG tablet TAKE 1 TABLET(40 MG) BY MOUTH TWICE DAILY 03/02/17  Yes Byrum, Rose Fillers, MD  predniSONE (DELTASONE) 10 MG tablet Take 5 tablets for 3 days; Take 4 tablets for 4 days; Take 3 tablets for 3 days; Take 2 tablet daily. Patient taking differently: Take 20 mg by mouth daily with breakfast.  08/17/17  Yes Patrecia Pour, Christean Grief, MD  ranitidine (ZANTAC) 300 MG tablet TAKE 1 TABLET(300 MG) BY MOUTH AT BEDTIME Patient taking differently: Take 300 mg by mouth at bedtime 08/09/17  Yes Nandigam, Venia Minks, MD  sodium chloride (OCEAN) 0.65 % SOLN nasal spray Place 2 sprays into both nostrils 2 (two) times daily.   Yes [provider]  traMADol (ULTRAM) 50 MG tablet Take 2 tablets (100 mg total) by mouth every 6 (six) hours as needed. TAKE 1-2 TABLETS EVERY 4 HOURS AS NEED FOR COUGH OR PAIN Patient taking differently: Take 100 mg by mouth 4 (four) times daily as needed (for pain).  03/15/17  Yes Collene Gobble, MD  glucose blood [facility] VERIO) test strip Use as instructed to test twice daily DX E11.22 08/23/17   Renato Shin, MD  Insulin Syringe-Needle U-100 (INSULIN SYRINGE 1CC/31GX5/16") 31G X 5/16" 1 ML MISC USE TO INJECT INSULIN TWICE DAILY 05/11/17   Renato Shin, MD  ONE TOUCH LANCETS MISC Use to check sugars twice daily DX E11.22 08/23/17   Renato Shin, MD    Family History Family History  Problem Relation Age of Onset  . Esophageal cancer Brother   . Esophageal cancer Sister        ?  . Colon polyps Brother   . Pancreatic cancer Sister        ?  . Diabetes Sister   . Diabetes Unknown        Aunt and Uncle  . Heart disease Maternal Grandfather     Social History Social History   Tobacco Use  . Smoking status: Former Smoker    Packs/day: 1.00    Years: 20.00    Pack years: 20.00    Types: Cigarettes    Last  attempt to quit:  06/22/1986    Years since quitting: 31.2  . Smokeless tobacco: Never Used  Substance Use Topics  . Alcohol use: Yes  . Drug use: No     Allergies   Ace inhibitors; Penicillins; and Adhesive [tape]   Review of Systems Review of Systems  Unable to perform ROS: Mental status change     Physical Exam Updated Vital Signs BP 113/84 (BP Location: Right Arm)   Pulse (!) 135   Temp 98.5 F (36.9 C) (Oral)   Resp 15   Ht 5\' 4"  (1.626 m)   Wt (!) 188.7 kg (416 lb)   SpO2 (!) 85% Comment: Was put on 3L O2 and SpO2 increased to 93.  BMI 71.41 kg/m   Physical Exam  Constitutional: She appears well-developed and well-nourished. No distress.  HENT:  Head: Normocephalic and atraumatic.  Eyes: Conjunctivae are normal.  Neck: Neck supple.  Cardiovascular: Regular rhythm, normal heart sounds and intact distal pulses. Tachycardia present.  Pulmonary/Chest: Effort normal and breath sounds normal. No respiratory distress.  Abdominal: Soft. There is tenderness in the suprapubic area. There is no guarding.  Musculoskeletal: She exhibits no edema.  Lymphadenopathy:    She has no cervical adenopathy.  Neurological: She is alert.  Patient is oriented to self only.  Skin: Skin is warm and dry. She is not diaphoretic.  Patient does have patches of erythema and minor skin breakdown to the bilateral proximal inner thighs, beneath the pannus, as well as on her back and buttocks.  Psychiatric: She has a normal mood and affect. Her behavior is normal.  Nursing note and vitals reviewed.    ED Treatments / Results  Labs (all labs ordered are listed, but only abnormal results are displayed) Labs Reviewed  COMPREHENSIVE METABOLIC PANEL - Abnormal; Notable for the following components:      Result Value   Potassium 5.3 (*)    Chloride 98 (*)    Glucose, Bld 238 (*)    BUN 39 (*)    Creatinine, Ser 2.11 (*)    Albumin 3.1 (*)    GFR calc non Af Amer 23 (*)    GFR calc Af Amer 27 (*)    Anion  gap 16 (*)    All other components within normal limits  CBC WITH DIFFERENTIAL/PLATELET - Abnormal; Notable for the following components:   WBC 13.7 (*)    HCT 46.4 (*)    Neutro Abs 12.6 (*)    All other components within normal limits  URINALYSIS, ROUTINE W REFLEX MICROSCOPIC - Abnormal; Notable for the following components:   Glucose, UA 50 (*)    All other components within normal limits  I-STAT CG4 LACTIC ACID, ED - Abnormal; Notable for the following components:   Lactic Acid, Venous 4.68 (*)    All other components within normal limits  I-STAT CG4 LACTIC ACID, ED - Abnormal; Notable for the following components:   Lactic Acid, Venous 2.44 (*)    All other components within normal limits  I-STAT VENOUS BLOOD GAS, ED - Abnormal; Notable for the following components:   pO2, Ven 49.0 (*)    Acid-base deficit 5.0 (*)    All other components within normal limits  CULTURE, BLOOD (ROUTINE X 2)  CULTURE, BLOOD (ROUTINE X 2)  PROTIME-INR  BLOOD GAS, VENOUS    EKG  EKG Interpretation  Date/Time:  Monday August 30 2017 12:45:11 EDT Ventricular Rate:  126 PR Interval:  142 QRS Duration: 68 QT Interval:  318 QTC Calculation: 460 R Axis:   -12 Text Interpretation:  Sinus tachycardia Right atrial enlargement Left ventricular hypertrophy Nonspecific T wave abnormality Abnormal ECG no STEMI. no sig change from previous Confirmed by Charlesetta Shanks [phone]) on 08/30/2017 1:06:55 PM       Radiology Ct Head Wo Contrast  Result Date: 08/30/2017 CLINICAL DATA:  Headaches EXAM: CT HEAD WITHOUT CONTRAST TECHNIQUE: Contiguous axial images were obtained from the base of the skull through the vertex without intravenous contrast. COMPARISON:  07/02/2017 FINDINGS: Brain: Mild atrophic changes are noted. No findings to suggest acute hemorrhage, acute infarction or space-occupying mass lesion are noted. Vascular: No hyperdense vessel or unexpected calcification. Skull: Normal. Negative for fracture  or focal lesion. Sinuses/Orbits: No acute finding. Other: None. IMPRESSION: Mild atrophic changes without acute abnormality. Electronically Signed   By: Inez Catalina M.D.   On: 08/30/2017 17:04   Dg Chest Portable 1 View  Result Date: 08/30/2017 CLINICAL DATA:  Abdominal pain, central line placement. EXAM: PORTABLE CHEST 1 VIEW COMPARISON:  Chest radiograph August 12, 2017 FINDINGS: Stable cardiomegaly. Calcified aortic knob. Mild bronchitic changes. Blunting of the costophrenic angles. New RIGHT internal jugular central venous catheter with distal tip projecting RIGHT atrium. No pneumothorax. ACDF. IMPRESSION: RIGHT internal jugular central venous catheter distal tip projecting in RIGHT atrium, recommend 5 cm retraction. Stable cardiomegaly and bronchitic changes. Small suspected pleural effusions. Aortic Atherosclerosis (ICD10-I70.0). Electronically Signed   By: Elon Alas M.D.   On: 08/30/2017 15:59    Procedures .Critical Care Performed by: Lorayne Bender, PA-C Authorized by: Lorayne Bender, PA-C   Critical care provider statement:    Critical care time (minutes):  45   Critical care time was exclusive of:  Separately billable procedures and treating other patients   Critical care was necessary to treat or prevent imminent or life-threatening deterioration of the following conditions:  Shock   Critical care was time spent personally by me on the following activities:  Evaluation of patient's response to treatment, discussions with consultants, development of treatment plan with patient or surrogate, ordering and performing treatments and interventions, ordering and review of laboratory studies, ordering and review of radiographic studies, pulse oximetry, re-evaluation of patient's condition, obtaining history from patient or surrogate and examination of patient   I assumed direction of critical care for this patient from another provider in my specialty: no   Angiocath  insertion Date/Time: 08/30/2017 2:00 PM Performed by: Lorayne Bender, PA-C Authorized by: Lorayne Bender, PA-C  Consent: Verbal consent obtained. Risks and benefits: risks, benefits and alternatives were discussed Consent given by: patient Patient identity confirmed: verbally with patient and provided demographic data Local anesthesia used: no  Anesthesia: Local anesthesia used: no  Sedation: Patient sedated: no  Patient tolerance: Patient tolerated the procedure well with no immediate complications Comments: Unsuccessful ultrasound guided peripheral IV attempt in proximal left arm.    (including critical care time)  Medications Ordered in ED Medications  vancomycin (VANCOCIN) 2,500 mg in sodium chloride 0.9 % 500 mL IVPB (2,500 mg Intravenous New Bag/Given 08/30/17 1552)  ceFEPIme (MAXIPIME) 2 g in sodium chloride 0.9 % 100 mL IVPB (not administered)  vancomycin (VANCOCIN) 2,000 mg in sodium chloride 0.9 % 500 mL IVPB (not administered)  sodium chloride flush (NS) 0.9 % injection 10-40 mL (not administered)  sodium chloride 0.9 % bolus 1,000 mL (0 mLs Intravenous Stopped 08/30/17 1607)    Followed by  sodium chloride 0.9 % bolus 1,000 mL (0 mLs Intravenous  Stopped 08/30/17 1607)    Followed by  sodium chloride 0.9 % bolus 1,000 mL (1,000 mLs Intravenous New Bag/Given 08/30/17 1607)  ceFEPIme (MAXIPIME) 2 g in sodium chloride 0.9 % 100 mL IVPB (0 g Intravenous Stopped 08/30/17 1659)     Initial Impression / Assessment and Plan / ED Course  I have reviewed the triage vital signs and the nursing notes.  Pertinent labs & imaging results that were available during my care of the patient were reviewed by me and considered in my medical decision making (see chart for details).  Clinical Course as of Aug 31 1719  Mon Aug 30, 2017  1515 Patient's blood pressure appears to be responding to fluids.  SBP 102.  [SJ]  62 Spoke with Dr. Debbora Dus, [place]. States he will come see the  patient.   [SJ]    Clinical Course User Index [SJ] Gordy Goar C, PA-C   Patient presents with altered mental status and abdominal pain.  Patient ill-appearing, tachycardic, and altered.  During ED course, patient developed hypotension.  Great difficulty was encountered with obtaining peripheral IV access.  Central line was required.  The source of infection was initially thought to be urinary, however, there was no evidence of infection on UA. C. difficile colitis was also considered due to recent hospitalization and antibiotic use, however, but was thought less likely due to lack of diarrhea.  Patient's hypotension improved with fluid boluses. Admitted to critical care service.    Findings and plan of care discussed with Charlesetta Shanks, MD. Dr. Johnney Killian personally evaluated and examined this patient.  Vitals:   08/30/17 1430 08/30/17 1500 08/30/17 1530 08/30/17 1541  BP: (!) 63/39 (!) 90/55 116/67 116/67  Pulse:   (!) 116 (!) 117  Resp: 18 17 15 16   Temp:    (!) 101.7 F (38.7 C)  TempSrc:    Rectal  SpO2:   99% 100%  Weight:      Height:       Vitals:   08/30/17 1600 08/30/17 1615 08/30/17 1630 08/30/17 1645  BP: 133/65 (!) 119/54 132/68 95/76  Pulse: (!) 117 (!) 120 (!) 120 (!) 118  Resp: 14 20 13 20   Temp:      TempSrc:      SpO2: 98% 100% 98% 97%  Weight:      Height:         Final Clinical Impressions(s) / ED Diagnoses   Final diagnoses:  Lower abdominal pain  Acute renal failure, unspecified acute renal failure type [facility])    ED Discharge Orders    None       Layla Maw 08/30/17 1745    Charlesetta Shanks, MD 09/03/17 1110

## 2017-08-30 NOTE — ED Notes (Signed)
ABG collected by RT

## 2017-08-30 NOTE — H&P (Signed)
PULMONARY / CRITICAL CARE MEDICINE   Name: Christy May MRN: [id number] DOB: 1951-05-09    ADMISSION DATE:  08/30/2017 Admit Date :  08/30/17    CHIEF COMPLAINT:  Altered mental status, fever  HISTORY OF PRESENT ILLNESS:   This is 67 yo B/F who was brought here from a a rehab facility. She was acting strangely when her daughter came in for a visit. She had chills much of the morning. She did have some pressure in her bladder. The patient had been hospitalized over 2 weeks ago with a serious UTI. Her UA is unremarkable today. The patient does complain of pain in the RLQ. He WBC is 13K. Her initial lactate was about 4.6. Her procal was just over 1. The patient denies diarrhea. In fact it appears she has been constipated. CXR did not suggest an acute infiltrate. The patient was sent for CT scan of the abdomen /pelvis and the results are pending. While in the ED the  patient dropped her bp precipitously requiring that a CVPO be placed and the addtion of pressors.      PAST MEDICAL HISTORY :  She  has a past medical history of Allergic bronchitis, Allergy, Anemia, unspecified, Anxiety, Arthritis, Chronic airway obstruction, not elsewhere classified, Chronic back pain, Colon polyps, Complication of anesthesia, Dehydration, Depressive disorder, not elsewhere classified, Diabetic peripheral neuropathy ([place]), Dysphagia (2007), Esophageal reflux, Extrinsic asthma, unspecified, Family history of adverse reaction to anesthesia, Headache, Heart murmur, History of hiatal hernia, Inflammatory and toxic neuropathy, unspecified, Migraine, Obesity, unspecified, OSA (obstructive sleep apnea), Spondylosis of unspecified site without mention of myelopathy, Type II diabetes mellitus ([place]), Unspecified essential hypertension, Unspecified menopausal and postmenopausal disorder, and Unspecified vitamin D deficiency.  PAST SURGICAL HISTORY: She  has a past surgical history that includes Refractive surgery  (Bilateral); Anterior cervical decomp/discectomy fusion; Incision and drainage abscess; Dilation and curettage of uterus; Exploratory laparotomy; Total knee arthroplasty (Left, 10/2003); Total knee arthroplasty (Right, 01/2004); Foot surgery (Right, X 2); Appendectomy; Tonsillectomy; Esophagogastroduodenoscopy (N/A, 05/08/2014); Colonoscopy (N/A, 05/08/2014); Back surgery; Cholecystectomy open; Abdominal hysterectomy; Eye surgery; and Joint replacement.  Allergies  Allergen Reactions  . Ace Inhibitors Anaphylaxis and Cough  . Penicillins Hives and Other (See Comments)    Tolerated ceftriaxone in 2019  Has patient had a PCN reaction causing immediate rash, facial/tongue/throat swelling, SOB or lightheadedness with hypotension: Yes Has patient had a PCN reaction causing severe rash involving mucus membranes or skin necrosis:  No Has patient had a PCN reaction that required hospitalization: No Has patient had a PCN reaction occurring within the last 10 years: No If all of the above answers are "NO", then may proceed with Cephalosporin use.  . Adhesive [Tape] Hives and Rash    No current facility-administered medications on file prior to encounter.    Current Outpatient Medications on File Prior to Encounter  Medication Sig  . albuterol (PROVENTIL) (2.5 MG/3ML) 0.083% nebulizer solution Take 2.5 mg by nebulization 4 (four) times daily as needed for wheezing or shortness of breath.   . allopurinol (ZYLOPRIM) 300 MG tablet Take 300 mg by mouth daily.  Marland Kitchen aspirin EC 81 MG tablet Take 81 mg by mouth at bedtime.  . Cholecalciferol (VITAMIN D-3) 1000 units CAPS Take 2,000 Units by mouth daily.  . Cobalamine Combinations (VITAMIN B12-FOLIC ACID PO) Take 1 tablet by mouth daily.  . cyclobenzaprine (FLEXERIL) 10 MG tablet Take 10 mg by mouth every 8 (eight) hours as needed for muscle spasms.  . cycloSPORINE (RESTASIS) 0.05 %  ophthalmic emulsion Place 1 drop into both eyes 2 (two) times daily.   .  fluticasone (FLONASE) 50 MCG/ACT nasal spray SHAKE LIQUID AND USE 2 SPRAYS IN EACH NOSTRIL DAILY  . folic acid (FOLVITE) 1 MG tablet TAKE 4 TABLETS(4 MG) BY MOUTH DAILY (Patient taking differently: Take 1 mg by mouth once a day)  . gabapentin (NEURONTIN) 800 MG tablet Take 1 tablet (800 mg total) by mouth 3 (three) times daily.  Marland Kitchen HYDROcodone-acetaminophen (NORCO) 7.5-325 MG tablet Take 1 tablet by mouth 3 (three) times daily as needed (for pain).   . insulin aspart (NOVOLOG) 100 UNIT/ML injection Inject 0-12 Units into the skin See admin instructions. Inject 0-12 units into the skin before meals & at bedtime, per sliding scale: BGL <70 = call MD; 70-200 = 0 units; 201-250 = 2 units; 251-300 = 4 units; 301-350 = 6 units; 351-400 = 8 units; 401-450 = 10 units; >450 = 12 units  . insulin NPH Human (NOVOLIN N RELION) 100 UNIT/ML injection Inject 1.2 mLs (120 Units total) into the skin every morning.  Marland Kitchen ipratropium (ATROVENT) 0.03 % nasal spray USE 2 SPRAYS IN EACH NOSTRIL THREE TIMES DAILY AS NEEDED FOR RHINITIS  . irbesartan (AVAPRO) 300 MG tablet Take 300 mg by mouth daily.  . Lidocaine-Menthol (LIMENCIN) 4-4 % PTCH Apply 1 patch topically See admin instructions. Apply 1 patch daily to the right shoulder/remove each evening  . loratadine (CLARITIN) 10 MG tablet Take 10 mg by mouth daily.  . metoprolol (LOPRESSOR) 50 MG tablet Take 75 mg by mouth 2 (two) times daily.  . montelukast (SINGULAIR) 10 MG tablet TAKE 1 TABLET(10 MG) BY MOUTH DAILY  . Multiple Vitamins-Minerals (MULTIVITAMIN WITH MINERALS) tablet Take 1 tablet by mouth daily.  Marland Kitchen oxybutynin (DITROPAN-XL) 10 MG 24 hr tablet Take 1 tablet (10 mg total) by mouth daily.  . pantoprazole (PROTONIX) 40 MG tablet TAKE 1 TABLET(40 MG) BY MOUTH TWICE DAILY  . predniSONE (DELTASONE) 10 MG tablet Take 5 tablets for 3 days; Take 4 tablets for 4 days; Take 3 tablets for 3 days; Take 2 tablet daily. (Patient taking differently: Take 20 mg by mouth daily with  breakfast. )  . ranitidine (ZANTAC) 300 MG tablet TAKE 1 TABLET(300 MG) BY MOUTH AT BEDTIME (Patient taking differently: Take 300 mg by mouth at bedtime)  . sodium chloride (OCEAN) 0.65 % SOLN nasal spray Place 2 sprays into both nostrils 2 (two) times daily.  . traMADol (ULTRAM) 50 MG tablet Take 2 tablets (100 mg total) by mouth every 6 (six) hours as needed. TAKE 1-2 TABLETS EVERY 4 HOURS AS NEED FOR COUGH OR PAIN (Patient taking differently: Take 100 mg by mouth 4 (four) times daily as needed (for pain). )  . glucose blood (ONETOUCH VERIO) test strip Use as instructed to test twice daily DX E11.22  . Insulin Syringe-Needle U-100 (INSULIN SYRINGE 1CC/31GX5/16") 31G X 5/16" 1 ML MISC USE TO INJECT INSULIN TWICE DAILY  . ONE TOUCH LANCETS MISC Use to check sugars twice daily DX E11.22    FAMILY HISTORY:  Her indicated that all of her three sisters are alive. She indicated that both of her brothers are alive. She indicated that the status of her maternal grandfather is unknown. She indicated that the status of her unknown relative is unknown.   SOCIAL HISTORY: She  reports that she quit smoking about 31 years ago. Her smoking use included cigarettes. She has a 20.00 pack-year smoking history. she has never used smokeless tobacco.  She reports that she drinks alcohol. She reports that she does not use drugs.  REVIEW OF SYSTEMS:    VITAL SIGNS: BP 95/76   Pulse (!) 118   Temp (!) 101.7 F (38.7 C) (Rectal)   Resp 20   Ht 5\' 4"  (1.626 m)   Wt (!) 416 lb (188.7 kg)   SpO2 97%   BMI 71.41 kg/m   HEMODYNAMICS:    VENTILATOR SETTINGS:    INTAKE / OUTPUT: No intake/output data recorded.  PHYSICAL EXAMINATION: General:  Partient appears acutely ill Neuro:  Patient is responsive, normal comprehension, moving all extr. HEENT:  [place]/AT Cardiovascular:  RRR,s1s2 Lungs:  Bialteral BS Abdomen:obese, soft, BS, minimal rlq tenderenss  \Skin: no breakdown, warm to touch.     LABS:  BMET Recent Labs  Lab 08/30/17 1354  NA 136  K 5.3*  CL 98*  CO2 22  BUN 39*  CREATININE 2.11*  GLUCOSE 238*    Electrolytes Recent Labs  Lab 08/30/17 1354  CALCIUM 9.2    CBC Recent Labs  Lab 08/30/17 1354  WBC 13.7*  HGB 14.8  HCT 46.4*  PLT PLATELETS APPEAR ADEQUATE    Coag's Recent Labs  Lab 08/30/17 1425  INR 0.98    Sepsis Markers Recent Labs  Lab 08/30/17 1404 08/30/17 1544  LATICACIDVEN 4.68* 2.44*    ABG No results for input(s): PHART, PCO2ART, PO2ART in the last 168 hours.  Liver Enzymes Recent Labs  Lab 08/30/17 1354  AST 30  ALT 24  ALKPHOS 83  BILITOT 0.8  ALBUMIN 3.1*    Cardiac Enzymes No results for input(s): TROPONINI, PROBNP in the last 168 hours.  Glucose No results for input(s): GLUCAP in the last 168 hours.  Imaging Ct Head Wo Contrast  Result Date: 08/30/2017 CLINICAL DATA:  Headaches EXAM: CT HEAD WITHOUT CONTRAST TECHNIQUE: Contiguous axial images were obtained from the base of the skull through the vertex without intravenous contrast. COMPARISON:  07/02/2017 FINDINGS: Brain: Mild atrophic changes are noted. No findings to suggest acute hemorrhage, acute infarction or space-occupying mass lesion are noted. Vascular: No hyperdense vessel or unexpected calcification. Skull: Normal. Negative for fracture or focal lesion. Sinuses/Orbits: No acute finding. Other: None. IMPRESSION: Mild atrophic changes without acute abnormality. Electronically Signed   By: Inez Catalina M.D.   On: 08/30/2017 17:04   Dg Chest Portable 1 View  Result Date: 08/30/2017 CLINICAL DATA:  Abdominal pain, central line placement. EXAM: PORTABLE CHEST 1 VIEW COMPARISON:  Chest radiograph August 12, 2017 FINDINGS: Stable cardiomegaly. Calcified aortic knob. Mild bronchitic changes. Blunting of the costophrenic angles. New RIGHT internal jugular central venous catheter with distal tip projecting RIGHT atrium. No pneumothorax. ACDF.  IMPRESSION: RIGHT internal jugular central venous catheter distal tip projecting in RIGHT atrium, recommend 5 cm retraction. Stable cardiomegaly and bronchitic changes. Small suspected pleural effusions. Aortic Atherosclerosis (ICD10-I70.0). Electronically Signed   By: Elon Alas M.D.   On: 08/30/2017 15:59     STUDIES:  CT abdo/pelvis results pending  C  LINES/TUBES: CVP Right IJ     ASSESSMENT / PLAN: Sepsis The etiology or source is  Not clear presently. Her UA appears unremarkable.  The patient is not short of breath , coughing and her CXR is otherwise unremarkable. She has been on abx but has no diarrhea making C.Diff unlikely/ A CT scan was done to try top firm up the diagnosis and the results are pending. In the interim the patient has been cultred and placed on empiric abx  therapy with Vancocin and Cefipime. The patient will be admitted to the ICU given her hypotension and altered mental status. Her lactate appears to be trending down presently.    FAMILY  - I discussed the situation and our thoughts re the diagnosis with the patient's daughter at the bedside. MS. [facility]    Pulmonary and [place] Pager: [phone]  08/30/2017, 5:29 PM

## 2017-08-30 NOTE — ED Provider Notes (Signed)
Physical Exam  BP 116/67 (BP Location: Right Wrist)   Pulse (!) 117   Temp (!) 101.7 F (38.7 C) (Rectal)   Resp 16   Ht 5\' 4"  (1.626 m)   Wt (!) 188.7 kg (416 lb)   SpO2 100%   BMI 71.41 kg/m   Physical Exam  Constitutional:  Patient is awake and responds appropriately to questions but is fatigued and very mildly confused.  No respiratory distress at rest.  Morbid obesity.  Ill appearance.  HENT:  Head: Normocephalic.  Eyes: EOM are normal.  Cardiovascular:  Tachycardia.  No gross rub or gallop.  Patient has very thick chest wall.  Breath sounds grossly symmetric.  Pulmonary/Chest:  No respiratory distress at rest.  Patient has very thick chest wall.  Grossly symmetric breath sounds.  No gross wheeze or rhonchi or rale  Abdominal:  Abdomen is obese and soft.  She endorses tenderness diffusely in the lower abdomen.  Musculoskeletal:  Calves are symmetric.  No significant reproducible tenderness.  Neurological:  Patient is awake and interacting.  She is answering questions appropriately.  She does however seem mildly confused and somnolent.  No focal neurologic deficits.  She does move each independent extremity at command.  Skin: Skin is warm and dry. There is pallor.    ED Course/Procedures   Clinical Course as of Aug 30 1628  Mon Aug 30, 2017  1515 Patient's blood pressure appears to be responding to fluids.  SBP 102.  [SJ]  28 Spoke with Dr. Debbora Dus, [place]. States he will come see the patient.   [SJ]    Clinical Course User Index [SJ] Joy, Shawn C, PA-C    .Central Line Date/Time: 08/30/2017 4:23 PM Performed by: Charlesetta Shanks, MD Authorized by: Charlesetta Shanks, MD   Consent:    Consent obtained:  Verbal and emergent situation   Consent given by:  Patient Pre-procedure details:    Hand hygiene: Hand hygiene performed prior to insertion     Sterile barrier technique: All elements of maximal sterile technique followed     Skin preparation:  2%  chlorhexidine   Skin preparation agent: Skin preparation agent completely dried prior to procedure   Anesthesia (see MAR for exact dosages):    Anesthesia method:  Local infiltration   Local anesthetic:  Lidocaine 1% w/o epi Procedure details:    Location:  R internal jugular   Patient position:  Trendelenburg   Procedural supplies:  Triple lumen   Catheter size:  8.5 Fr   Landmarks identified: yes     Ultrasound guidance: yes     Sterile ultrasound techniques: Sterile gel and sterile probe covers were used     Number of attempts:  1   Successful placement: yes   Post-procedure details:    Post-procedure:  Dressing applied and line sutured   Assessment:  Blood return through all ports, free fluid flow, no pneumothorax on x-ray and placement verified by x-ray   Patient tolerance of procedure:  Tolerated well, no immediate complications    MDM   Patient presents with clinically ill appearance.  She is very tachycardic.  Once rectal temperature obtained she is febrile as well.  On initial assessment by PA-C Joy, there is high suspicion for sepsis.  Patient was very difficult IV start.  Couple attempts were made at peripheral IV by nursing and IV ultrasound nurse.  I also attempted peripheral IV by ultrasound and was able to get blood return out of the left AC fossa but the  catheter would not thread beyond the halfway mark.  Fluid was instilled but with catheter advanced it was obstructed.  At that point, central line was placed due to critical condition.  Patient has improved with fluid administration.  Blood pressure is stabilizing and heart rate improving.  Lactic acidosis improving.  I agree with plan and management.     Charlesetta Shanks, MD 08/30/17 1630

## 2017-08-30 NOTE — ED Triage Notes (Signed)
Pt arrives via EMS from [facility] with sudden onset of abdominal pain that began yesterday. Pt lethargic, oriented x2, denies recent fever. Denies pain at present. Tachycardic and hypotensive. Recently discharged for sepsis.

## 2017-08-30 NOTE — ED Notes (Signed)
IV TEAM at bedside 

## 2017-08-30 NOTE — ED Notes (Signed)
Admitting MD Debbora Dus at bedside

## 2017-08-31 LAB — BLOOD CULTURE ID PANEL (REFLEXED)
ACINETOBACTER BAUMANNII: NOT DETECTED
CANDIDA ALBICANS: NOT DETECTED
CANDIDA GLABRATA: NOT DETECTED
CANDIDA KRUSEI: NOT DETECTED
CANDIDA PARAPSILOSIS: NOT DETECTED
Candida tropicalis: NOT DETECTED
ENTEROBACTER CLOACAE COMPLEX: NOT DETECTED
ENTEROBACTERIACEAE SPECIES: NOT DETECTED
ESCHERICHIA COLI: NOT DETECTED
Enterococcus species: NOT DETECTED
Haemophilus influenzae: NOT DETECTED
KLEBSIELLA OXYTOCA: NOT DETECTED
Klebsiella pneumoniae: NOT DETECTED
Listeria monocytogenes: NOT DETECTED
Methicillin resistance: DETECTED — AB
Neisseria meningitidis: NOT DETECTED
PSEUDOMONAS AERUGINOSA: NOT DETECTED
Proteus species: NOT DETECTED
STREPTOCOCCUS PNEUMONIAE: NOT DETECTED
STREPTOCOCCUS PYOGENES: NOT DETECTED
Serratia marcescens: NOT DETECTED
Staphylococcus aureus (BCID): NOT DETECTED
Staphylococcus species: DETECTED — AB
Streptococcus agalactiae: NOT DETECTED
Streptococcus species: NOT DETECTED

## 2017-08-31 LAB — GLUCOSE, CAPILLARY
GLUCOSE-CAPILLARY: 271 mg/dL — AB (ref 65–99)
Glucose-Capillary: 201 mg/dL — ABNORMAL HIGH (ref 65–99)
Glucose-Capillary: 250 mg/dL — ABNORMAL HIGH (ref 65–99)
Glucose-Capillary: 256 mg/dL — ABNORMAL HIGH (ref 65–99)

## 2017-08-31 LAB — CBC WITH DIFFERENTIAL/PLATELET
BASOS PCT: 0 %
Basophils Absolute: 0 10*3/uL (ref 0.0–0.1)
EOS ABS: 0 10*3/uL (ref 0.0–0.7)
EOS PCT: 0 %
HCT: 38.4 % (ref 36.0–46.0)
Hemoglobin: 11.7 g/dL — ABNORMAL LOW (ref 12.0–15.0)
Lymphocytes Relative: 8 %
Lymphs Abs: 1.1 10*3/uL (ref 0.7–4.0)
MCH: 28.5 pg (ref 26.0–34.0)
MCHC: 30.5 g/dL (ref 30.0–36.0)
MCV: 93.7 fL (ref 78.0–100.0)
MONO ABS: 0.9 10*3/uL (ref 0.1–1.0)
Monocytes Relative: 6 %
Neutro Abs: 12.3 10*3/uL — ABNORMAL HIGH (ref 1.7–7.7)
Neutrophils Relative %: 86 %
PLATELETS: 198 10*3/uL (ref 150–400)
RBC: 4.1 MIL/uL (ref 3.87–5.11)
RDW: 14.9 % (ref 11.5–15.5)
WBC: 14.2 10*3/uL — ABNORMAL HIGH (ref 4.0–10.5)

## 2017-08-31 LAB — BASIC METABOLIC PANEL
Anion gap: 7 (ref 5–15)
BUN: 32 mg/dL — AB (ref 6–20)
CHLORIDE: 107 mmol/L (ref 101–111)
CO2: 25 mmol/L (ref 22–32)
CREATININE: 1.28 mg/dL — AB (ref 0.44–1.00)
Calcium: 8.3 mg/dL — ABNORMAL LOW (ref 8.9–10.3)
GFR calc Af Amer: 49 mL/min — ABNORMAL LOW (ref >60)
GFR calc non Af Amer: 42 mL/min — ABNORMAL LOW (ref >60)
Glucose, Bld: 226 mg/dL — ABNORMAL HIGH (ref 65–99)
Potassium: 4.6 mmol/L (ref 3.5–5.1)
SODIUM: 139 mmol/L (ref 135–145)

## 2017-08-31 LAB — CLOSTRIDIUM DIFFICILE BY PCR, REFLEXED: Toxigenic C. Difficile by PCR: NEGATIVE

## 2017-08-31 LAB — C DIFFICILE QUICK SCREEN W PCR REFLEX
C DIFFICILE (CDIFF) TOXIN: NEGATIVE
C DIFFICLE (CDIFF) ANTIGEN: POSITIVE — AB

## 2017-08-31 LAB — TYPE AND SCREEN
ABO/RH(D): B POS
Antibody Screen: NEGATIVE

## 2017-08-31 LAB — MAGNESIUM: MAGNESIUM: 1.6 mg/dL — AB (ref 1.7–2.4)

## 2017-08-31 LAB — PHOSPHORUS: PHOSPHORUS: 3.4 mg/dL (ref 2.5–4.6)

## 2017-08-31 LAB — MRSA PCR SCREENING: MRSA BY PCR: POSITIVE — AB

## 2017-08-31 MED ORDER — FOLIC ACID 1 MG PO TABS
1.0000 mg | ORAL_TABLET | Freq: Every day | ORAL | Status: DC
  Administered 2017-08-31 – 2017-09-03 (×4): 1 mg via ORAL
  Filled 2017-08-31 (×4): qty 1

## 2017-08-31 MED ORDER — ASPIRIN EC 81 MG PO TBEC
81.0000 mg | DELAYED_RELEASE_TABLET | Freq: Every day | ORAL | Status: DC
  Administered 2017-08-31 – 2017-09-02 (×3): 81 mg via ORAL
  Filled 2017-08-31 (×3): qty 1

## 2017-08-31 MED ORDER — IPRATROPIUM BROMIDE 0.03 % NA SOLN
1.0000 | Freq: Three times a day (TID) | NASAL | Status: DC
  Filled 2017-08-31: qty 30

## 2017-08-31 MED ORDER — MAGNESIUM SULFATE 2 GM/50ML IV SOLN
2.0000 g | Freq: Once | INTRAVENOUS | Status: AC
  Administered 2017-08-31: 2 g via INTRAVENOUS
  Filled 2017-08-31: qty 50

## 2017-08-31 MED ORDER — ALBUTEROL SULFATE (2.5 MG/3ML) 0.083% IN NEBU
2.5000 mg | INHALATION_SOLUTION | Freq: Four times a day (QID) | RESPIRATORY_TRACT | Status: DC | PRN
Start: 2017-08-31 — End: 2017-09-03

## 2017-08-31 MED ORDER — GABAPENTIN 400 MG PO CAPS
800.0000 mg | ORAL_CAPSULE | Freq: Three times a day (TID) | ORAL | Status: DC
  Administered 2017-08-31 – 2017-09-03 (×10): 800 mg via ORAL
  Filled 2017-08-31 (×11): qty 2

## 2017-08-31 MED ORDER — MONTELUKAST SODIUM 10 MG PO TABS
10.0000 mg | ORAL_TABLET | Freq: Every day | ORAL | Status: DC
Start: 2017-08-31 — End: 2017-09-03
  Administered 2017-08-31 – 2017-09-02 (×3): 10 mg via ORAL
  Filled 2017-08-31 (×4): qty 1

## 2017-08-31 MED ORDER — INSULIN ASPART 100 UNIT/ML ~~LOC~~ SOLN
0.0000 [IU] | Freq: Three times a day (TID) | SUBCUTANEOUS | Status: DC
  Administered 2017-08-31: 4 [IU] via SUBCUTANEOUS
  Administered 2017-08-31 – 2017-09-01 (×2): 2 [IU] via SUBCUTANEOUS

## 2017-08-31 MED ORDER — INSULIN ASPART 100 UNIT/ML ~~LOC~~ SOLN: 1.0000 [IU] | Freq: Three times a day (TID) | SUBCUTANEOUS | Status: DC

## 2017-08-31 MED ORDER — FLUTICASONE PROPIONATE 50 MCG/ACT NA SUSP
1.0000 | Freq: Every day | NASAL | Status: DC
  Administered 2017-08-31 – 2017-09-03 (×4): 1 via NASAL
  Filled 2017-08-31 (×2): qty 16

## 2017-08-31 MED ORDER — METOPROLOL TARTRATE 50 MG PO TABS
75.0000 mg | ORAL_TABLET | Freq: Two times a day (BID) | ORAL | Status: DC
  Administered 2017-08-31 – 2017-09-03 (×7): 75 mg via ORAL
  Filled 2017-08-31 (×7): qty 1

## 2017-08-31 MED ORDER — ALLOPURINOL 100 MG PO TABS
300.0000 mg | ORAL_TABLET | Freq: Every day | ORAL | Status: DC
  Administered 2017-08-31 – 2017-09-03 (×4): 300 mg via ORAL
  Filled 2017-08-31: qty 3
  Filled 2017-08-31: qty 1
  Filled 2017-08-31: qty 3
  Filled 2017-08-31: qty 1

## 2017-08-31 MED ORDER — MUPIROCIN 2 % EX OINT
1.0000 "application " | TOPICAL_OINTMENT | Freq: Two times a day (BID) | CUTANEOUS | Status: DC
  Administered 2017-08-31 – 2017-09-03 (×8): 1 via NASAL
  Filled 2017-08-31 (×3): qty 22

## 2017-08-31 MED ORDER — HYDROCODONE-ACETAMINOPHEN 7.5-325 MG PO TABS
1.0000 | ORAL_TABLET | Freq: Three times a day (TID) | ORAL | Status: DC | PRN
  Administered 2017-08-31 – 2017-09-03 (×7): 1 via ORAL
  Filled 2017-08-31 (×7): qty 1

## 2017-08-31 MED ORDER — CYCLOSPORINE 0.05 % OP EMUL
1.0000 [drp] | Freq: Two times a day (BID) | OPHTHALMIC | Status: DC
  Administered 2017-08-31 – 2017-09-03 (×7): 1 [drp] via OPHTHALMIC
  Filled 2017-08-31 (×9): qty 1

## 2017-08-31 MED ORDER — CHLORHEXIDINE GLUCONATE CLOTH 2 % EX PADS
6.0000 | MEDICATED_PAD | Freq: Every day | CUTANEOUS | Status: DC
  Administered 2017-08-31 – 2017-09-03 (×3): 6 via TOPICAL

## 2017-08-31 MED ORDER — IPRATROPIUM BROMIDE 0.06 % NA SOLN
1.0000 | Freq: Three times a day (TID) | NASAL | Status: DC
  Administered 2017-08-31 – 2017-09-03 (×9): 1 via NASAL
  Filled 2017-08-31: qty 15

## 2017-08-31 MED ORDER — ENOXAPARIN SODIUM 40 MG/0.4ML ~~LOC~~ SOLN
40.0000 mg | SUBCUTANEOUS | Status: DC
  Administered 2017-08-31 – 2017-09-02 (×3): 40 mg via SUBCUTANEOUS
  Filled 2017-08-31 (×3): qty 0.4

## 2017-08-31 MED ORDER — VITAMIN D 1000 UNITS PO TABS
2000.0000 [IU] | ORAL_TABLET | Freq: Every day | ORAL | Status: DC
  Administered 2017-08-31 – 2017-09-03 (×4): 2000 [IU] via ORAL
  Filled 2017-08-31 (×4): qty 2

## 2017-08-31 NOTE — Progress Notes (Addendum)
Inpatient Diabetes Program Recommendations  AACE/ADA: New Consensus Statement on Inpatient Glycemic Control (2015)  Target Ranges:  Prepandial:   less than 140 mg/dL      Peak postprandial:   less than 180 mg/dL (1-2 hours)      Critically ill patients:  140 - 180 mg/dL   Lab Results  Component Value Date   GLUCAP 201 (H) 08/31/2017   HGBA1C 9.9 (H) 08/13/2017    Review of Glycemic Control Results for Christy May, Christy May (MRN [id number]) as of 08/31/2017 10:37  Ref. Range 08/30/2017 20:26 08/31/2017 08:35  Glucose-Capillary Latest Ref Range: 65 - 99 mg/dL 196 (H) 201 (H)   Home DM Meds: NPH 120 units QAM + Novolog 0-12 units tid  Endocrinologist: Dr. Einar Crow  Current Orders: None  MD- Please consider the following in-hospital insulin adjustments: -Novolog sensitive tid + hs 0-5 units -When eating, please add basal + meal coverage  Thank you, Nani Gasser. Danne Vasek, RN, MSN, CDE  Diabetes Coordinator Inpatient Glycemic Control Team Team Pager [phone] (8am-5pm) 08/31/2017 10:39 AM

## 2017-08-31 NOTE — Progress Notes (Signed)
eLink Physician-Brief Progress Note Patient Name: LENEA BYWATER DOB: [birth year] MRN: [id number]   Date of Service  08/31/2017  HPI/Events of Note  Watery BM X 3 - Request for Flexiseal and C. Difficle PCR screen.   eICU Interventions  Will order: 1. Place Flexiseal.  2. C. Difficile PCR screen.      Intervention Category Major Interventions: Other:  Lysle Dingwall 08/31/2017, 12:41 AM

## 2017-08-31 NOTE — Progress Notes (Signed)
PULMONARY / CRITICAL CARE MEDICINE   Name: Christy May MRN: [id number] DOB: 23-Dec-1950    ADMISSION DATE:  08/30/2017 Admit Date :  08/30/17    CHIEF COMPLAINT:  Altered mental status, fever  HISTORY OF PRESENT ILLNESS:   This is 67 yo B/F who was brought here from a a rehab facility. She was acting strangely when her daughter came in for a visit. She had chills much of the morning. She did have some pressure in her bladder. The patient had been hospitalized over 2 weeks ago with a serious UTI. Her UA is unremarkable today. The patient does complain of pain in the RLQ. He WBC is 13K. Her initial lactate was about 4.6. Her procal was just over 1. The patient denies diarrhea. In fact it appears she has been constipated. CXR did not suggest an acute infiltrate. The patient was sent for CT scan of the abdomen /pelvis and the results are pending. While in the ED the  patient dropped her bp precipitously requiring that a CVPO be placed and the addtion of pressors.  3/12 BP is much improved. Her mental status is better as well. Patient is afebbrile     PAST MEDICAL HISTORY :  She  has a past medical history of Allergic bronchitis, Allergy, Anemia, unspecified, Anxiety, Arthritis, Chronic airway obstruction, not elsewhere classified, Chronic back pain, Colon polyps, Complication of anesthesia, Dehydration, Depressive disorder, not elsewhere classified, Diabetic peripheral neuropathy ([place]), Dysphagia (2007), Esophageal reflux, Extrinsic asthma, unspecified, Family history of adverse reaction to anesthesia, Headache, Heart murmur, History of hiatal hernia, Inflammatory and toxic neuropathy, unspecified, Migraine, Obesity, unspecified, OSA (obstructive sleep apnea), Spondylosis of unspecified site without mention of myelopathy, Type II diabetes mellitus ([place]), Unspecified essential hypertension, Unspecified menopausal and postmenopausal disorder, and Unspecified vitamin D deficiency.  PAST  SURGICAL HISTORY: She  has a past surgical history that includes Refractive surgery (Bilateral); Anterior cervical decomp/discectomy fusion; Incision and drainage abscess; Dilation and curettage of uterus; Exploratory laparotomy; Total knee arthroplasty (Left, 10/2003); Total knee arthroplasty (Right, 01/2004); Foot surgery (Right, X 2); Appendectomy; Tonsillectomy; Esophagogastroduodenoscopy (N/A, 05/08/2014); Colonoscopy (N/A, 05/08/2014); Back surgery; Cholecystectomy open; Abdominal hysterectomy; Eye surgery; and Joint replacement.  Allergies  Allergen Reactions  . Ace Inhibitors Anaphylaxis and Cough  . Penicillins Hives and Other (See Comments)    Tolerated ceftriaxone in 2019  Has patient had a PCN reaction causing immediate rash, facial/tongue/throat swelling, SOB or lightheadedness with hypotension: Yes Has patient had a PCN reaction causing severe rash involving mucus membranes or skin necrosis:  No Has patient had a PCN reaction that required hospitalization: No Has patient had a PCN reaction occurring within the last 10 years: No If all of the above answers are "NO", then may proceed with Cephalosporin use.  . Adhesive [Tape] Hives and Rash    No current facility-administered medications on file prior to encounter.    Current Outpatient Medications on File Prior to Encounter  Medication Sig  . albuterol (PROVENTIL) (2.5 MG/3ML) 0.083% nebulizer solution Take 2.5 mg by nebulization 4 (four) times daily as needed for wheezing or shortness of breath.   . allopurinol (ZYLOPRIM) 300 MG tablet Take 300 mg by mouth daily.  Marland Kitchen aspirin EC 81 MG tablet Take 81 mg by mouth at bedtime.  . Cholecalciferol (VITAMIN D-3) 1000 units CAPS Take 2,000 Units by mouth daily.  . Cobalamine Combinations (VITAMIN B12-FOLIC ACID PO) Take 1 tablet by mouth daily.  . cyclobenzaprine (FLEXERIL) 10 MG tablet Take 10 mg by mouth  every 8 (eight) hours as needed for muscle spasms.  . cycloSPORINE (RESTASIS) 0.05  % ophthalmic emulsion Place 1 drop into both eyes 2 (two) times daily.   . fluticasone (FLONASE) 50 MCG/ACT nasal spray SHAKE LIQUID AND USE 2 SPRAYS IN EACH NOSTRIL DAILY  . folic acid (FOLVITE) 1 MG tablet TAKE 4 TABLETS(4 MG) BY MOUTH DAILY (Patient taking differently: Take 1 mg by mouth once a day)  . gabapentin (NEURONTIN) 800 MG tablet Take 1 tablet (800 mg total) by mouth 3 (three) times daily.  Marland Kitchen HYDROcodone-acetaminophen (NORCO) 7.5-325 MG tablet Take 1 tablet by mouth 3 (three) times daily as needed (for pain).   . insulin aspart (NOVOLOG) 100 UNIT/ML injection Inject 0-12 Units into the skin See admin instructions. Inject 0-12 units into the skin before meals & at bedtime, per sliding scale: BGL <70 = call MD; 70-200 = 0 units; 201-250 = 2 units; 251-300 = 4 units; 301-350 = 6 units; 351-400 = 8 units; 401-450 = 10 units; >450 = 12 units  . insulin NPH Human (NOVOLIN N RELION) 100 UNIT/ML injection Inject 1.2 mLs (120 Units total) into the skin every morning.  Marland Kitchen ipratropium (ATROVENT) 0.03 % nasal spray USE 2 SPRAYS IN EACH NOSTRIL THREE TIMES DAILY AS NEEDED FOR RHINITIS  . irbesartan (AVAPRO) 300 MG tablet Take 300 mg by mouth daily.  . Lidocaine-Menthol (LIMENCIN) 4-4 % PTCH Apply 1 patch topically See admin instructions. Apply 1 patch daily to the right shoulder/remove each evening  . loratadine (CLARITIN) 10 MG tablet Take 10 mg by mouth daily.  . metoprolol (LOPRESSOR) 50 MG tablet Take 75 mg by mouth 2 (two) times daily.  . montelukast (SINGULAIR) 10 MG tablet TAKE 1 TABLET(10 MG) BY MOUTH DAILY  . Multiple Vitamins-Minerals (MULTIVITAMIN WITH MINERALS) tablet Take 1 tablet by mouth daily.  Marland Kitchen oxybutynin (DITROPAN-XL) 10 MG 24 hr tablet Take 1 tablet (10 mg total) by mouth daily.  . pantoprazole (PROTONIX) 40 MG tablet TAKE 1 TABLET(40 MG) BY MOUTH TWICE DAILY  . predniSONE (DELTASONE) 10 MG tablet Take 5 tablets for 3 days; Take 4 tablets for 4 days; Take 3 tablets for 3 days; Take  2 tablet daily. (Patient taking differently: Take 20 mg by mouth daily with breakfast. )  . ranitidine (ZANTAC) 300 MG tablet TAKE 1 TABLET(300 MG) BY MOUTH AT BEDTIME (Patient taking differently: Take 300 mg by mouth at bedtime)  . sodium chloride (OCEAN) 0.65 % SOLN nasal spray Place 2 sprays into both nostrils 2 (two) times daily.  . traMADol (ULTRAM) 50 MG tablet Take 2 tablets (100 mg total) by mouth every 6 (six) hours as needed. TAKE 1-2 TABLETS EVERY 4 HOURS AS NEED FOR COUGH OR PAIN (Patient taking differently: Take 100 mg by mouth 4 (four) times daily as needed (for pain). )  . glucose blood (ONETOUCH VERIO) test strip Use as instructed to test twice daily DX E11.22  . Insulin Syringe-Needle U-100 (INSULIN SYRINGE 1CC/31GX5/16") 31G X 5/16" 1 ML MISC USE TO INJECT INSULIN TWICE DAILY  . ONE TOUCH LANCETS MISC Use to check sugars twice daily DX E11.22    FAMILY HISTORY:  Her indicated that all of her three sisters are alive. She indicated that both of her brothers are alive. She indicated that the status of her maternal grandfather is unknown. She indicated that the status of her unknown relative is unknown.   SOCIAL HISTORY: She  reports that she quit smoking about 31 years ago. Her smoking use  included cigarettes. She has a 20.00 pack-year smoking history. she has never used smokeless tobacco. She reports that she drinks alcohol. She reports that she does not use drugs.  REVIEW OF SYSTEMS:    VITAL SIGNS: BP 135/84 (BP Location: Right Wrist)   Pulse (!) 114   Temp 98.4 F (36.9 C) (Oral)   Resp 11   Ht 5\' 4"  (1.626 m)   Wt (!) 317 lb 14.5 oz (144.2 kg)   SpO2 99%   BMI 54.57 kg/m   HEMODYNAMICS: CVP:  [5 mmHg-21 mmHg] 10 mmHg  VENTILATOR SETTINGS:    INTAKE / OUTPUT: I/O last 3 completed shifts: In: 5237.5 [I.V.:1587.5; IV Piggyback:3650] Out: 500 [Urine:500]  PHYSICAL EXAMINATION: General:  Partient appears acutely ill Neuro:  Patient is responsive, normal  comprehension, moving all extr. HEENT:  [place]/AT Cardiovascular:  RRR,s1s2 Lungs:  Bialteral BS Abdomen:obese, soft, BS, minimal rlq tenderenss  \Skin: no breakdown, warm to touch.    LABS:  BMET Recent Labs  Lab 08/30/17 1354 08/30/17 1804  NA 136  --   K 5.3*  --   CL 98*  --   CO2 22  --   BUN 39*  --   CREATININE 2.11* 1.83*  GLUCOSE 238*  --     Electrolytes Recent Labs  Lab 08/30/17 1354 08/31/17 0425  CALCIUM 9.2  --   MG  --  1.6*  PHOS  --  3.4    CBC Recent Labs  Lab 08/30/17 1354 08/30/17 1804 08/31/17 0425  WBC 13.7* 16.1* 14.2*  HGB 14.8 12.4 11.7*  HCT 46.4* 39.5 38.4  PLT PLATELETS APPEAR ADEQUATE 210 198    Coag's Recent Labs  Lab 08/30/17 1425 08/30/17 1804  APTT  --  29  INR 0.98  --     Sepsis Markers Recent Labs  Lab 08/30/17 1544 08/30/17 1804 08/30/17 2140  LATICACIDVEN 2.44* 1.8 1.2  PROCALCITON  --  1.91  --     ABG Recent Labs  Lab 08/30/17 1811  PHART 7.243*  PCO2ART 47.8  PO2ART 105.0    Liver Enzymes Recent Labs  Lab 08/30/17 1354  AST 30  ALT 24  ALKPHOS 83  BILITOT 0.8  ALBUMIN 3.1*    Cardiac Enzymes Recent Labs  Lab 08/30/17 1804  TROPONINI <0.03    Glucose Recent Labs  Lab 08/30/17 2026 08/31/17 0835  GLUCAP 196* 201*    Imaging Ct Abdomen Pelvis Wo Contrast  Result Date: 08/30/2017 CLINICAL DATA:  Sudden onset generalized abdominal pain yesterday. Lethargy. EXAM: CT ABDOMEN AND PELVIS WITHOUT CONTRAST TECHNIQUE: Multidetector CT imaging of the abdomen and pelvis was performed following the standard protocol without IV contrast. COMPARISON:  02/28/2015 CT abdomen/pelvis. FINDINGS: Image quality is significantly degraded by patient body habitus and by streak artifact from the patient's upper extremities. Lower chest: Hypoventilatory changes at both lung bases. Tip of a superior approach central venous catheter is seen in the right atrium. Hepatobiliary: Normal liver size. No liver mass.  Cholecystectomy. Bile ducts are stable and within normal post cholecystectomy limits. CBD diameter 7 mm. Pancreas: Normal, with no mass or duct dilation. Spleen: Normal size. No mass. Adrenals/Urinary Tract: Normal adrenals. No renal stones. No hydronephrosis. No contour deforming renal masses. Normal bladder. Stomach/Bowel: Normal non-distended stomach. No disproportionately dilated small bowel loops or focal small bowel caliber transition. Fluid levels throughout the nondilated small bowel loops. Appendectomy. Fluid levels throughout the large, with no large bowel wall thickening or acute pericolonic fat stranding. Vascular/Lymphatic: Atherosclerotic nonaneurysmal  abdominal aorta. No pathologically enlarged lymph nodes in the abdomen or pelvis. Reproductive: Status post hysterectomy, with no abnormal findings at the vaginal cuff. No adnexal mass. Other: No pneumoperitoneum, ascites or focal fluid collection. Musculoskeletal: No aggressive appearing focal osseous lesions. Marked thoracolumbar spondylosis. IMPRESSION: 1. Fluid levels throughout the nondilated small and large bowel, indicative of a nonspecific malabsorptive state, such as can be due to an infectious enterocolitis. No evidence of bowel obstruction. 2.  Aortic Atherosclerosis (ICD10-I70.0). Electronically Signed   By: Ilona Sorrel M.D.   On: 08/30/2017 17:31   Ct Head Wo Contrast  Result Date: 08/30/2017 CLINICAL DATA:  Headaches EXAM: CT HEAD WITHOUT CONTRAST TECHNIQUE: Contiguous axial images were obtained from the base of the skull through the vertex without intravenous contrast. COMPARISON:  07/02/2017 FINDINGS: Brain: Mild atrophic changes are noted. No findings to suggest acute hemorrhage, acute infarction or space-occupying mass lesion are noted. Vascular: No hyperdense vessel or unexpected calcification. Skull: Normal. Negative for fracture or focal lesion. Sinuses/Orbits: No acute finding. Other: None. IMPRESSION: Mild atrophic changes  without acute abnormality. Electronically Signed   By: Inez Catalina M.D.   On: 08/30/2017 17:04   Dg Chest Portable 1 View  Result Date: 08/30/2017 CLINICAL DATA:  Abdominal pain, central line placement. EXAM: PORTABLE CHEST 1 VIEW COMPARISON:  Chest radiograph August 12, 2017 FINDINGS: Stable cardiomegaly. Calcified aortic knob. Mild bronchitic changes. Blunting of the costophrenic angles. New RIGHT internal jugular central venous catheter with distal tip projecting RIGHT atrium. No pneumothorax. ACDF. IMPRESSION: RIGHT internal jugular central venous catheter distal tip projecting in RIGHT atrium, recommend 5 cm retraction. Stable cardiomegaly and bronchitic changes. Small suspected pleural effusions. Aortic Atherosclerosis (ICD10-I70.0). Electronically Signed   By: Elon Alas M.D.   On: 08/30/2017 15:59        LINES/TUBES: CVP Right IJ     ASSESSMENT / PLAN: Sepsis The etiology or source is  Not clear presently. Her UA appears unremarkable.  The patient is not short of breath , coughing and her CXR is otherwise unremarkable. She has been on abx but has no diarrhea making C.Diff unlikely/ A CT scan was done to try top firm up the diagnosis and the results are pending. In the interim the patient has been cultred and placed on empiric abx therapy with Vancocin and Cefipime. The patient will be admitted to the ICU given her hypotension and altered mental status. Her lactate appears to be trending down presently. 3/12 The patient presently has a bp of 134/70 off of pressors. Awaiting new BMP thgis AM The C diff Ag was poositive but toxin was negative making the reults indeterminate. No diarrhea No major abdomnnal pain.Overlall thepatient appears to be imporoving    FAMILY  - I discussed the situation and our thoughts re the diagnosis with the patient's daughter at the bedside. MS. [facility]    Pulmonary and [place] Pager: [phone]  08/31/2017, 9:42 AM

## 2017-08-31 NOTE — Progress Notes (Signed)
PHARMACY - PHYSICIAN COMMUNICATION CRITICAL VALUE ALERT - BLOOD CULTURE IDENTIFICATION (BCID)  Christy May is an 67 y.o. female who presented to [facility] on 08/30/2017 s/p fall with AMS and fever.  Assessment:   67 YOF presented with sepsis with an unknown source of infection.  Name of physician (or Provider) Contacted: Dr. Debbora Dus  Current antibiotics: vancomycin and cefepime  Changes to prescribed antibiotics recommended:  Patient is on recommended antibiotics - No changes needed F/U clinical status to narrow  Results for orders placed or performed during the hospital encounter of 08/30/17  Blood Culture ID Panel (Reflexed) (Collected: 08/30/2017  2:53 PM)  Result Value Ref Range   Enterococcus species NOT DETECTED NOT DETECTED   Listeria monocytogenes NOT DETECTED NOT DETECTED   Staphylococcus species DETECTED (A) NOT DETECTED   Staphylococcus aureus NOT DETECTED NOT DETECTED   Methicillin resistance DETECTED (A) NOT DETECTED   Streptococcus species NOT DETECTED NOT DETECTED   Streptococcus agalactiae NOT DETECTED NOT DETECTED   Streptococcus pneumoniae NOT DETECTED NOT DETECTED   Streptococcus pyogenes NOT DETECTED NOT DETECTED   Acinetobacter baumannii NOT DETECTED NOT DETECTED   Enterobacteriaceae species NOT DETECTED NOT DETECTED   Enterobacter cloacae complex NOT DETECTED NOT DETECTED   Escherichia coli NOT DETECTED NOT DETECTED   Klebsiella oxytoca NOT DETECTED NOT DETECTED   Klebsiella pneumoniae NOT DETECTED NOT DETECTED   Proteus species NOT DETECTED NOT DETECTED   Serratia marcescens NOT DETECTED NOT DETECTED   Haemophilus influenzae NOT DETECTED NOT DETECTED   Neisseria meningitidis NOT DETECTED NOT DETECTED   Pseudomonas aeruginosa NOT DETECTED NOT DETECTED   Candida albicans NOT DETECTED NOT DETECTED   Candida glabrata NOT DETECTED NOT DETECTED   Candida krusei NOT DETECTED NOT DETECTED   Candida parapsilosis NOT DETECTED NOT DETECTED   Candida  tropicalis NOT DETECTED NOT DETECTED    Myrl Lazarus D. Mina Marble, PharmD, BCPS Pager:  [phone] 08/31/2017, 3:44 PM

## 2017-09-01 ENCOUNTER — Encounter (HOSPITAL_COMMUNITY): Payer: Self-pay | Admitting: Pulmonary Disease

## 2017-09-01 DIAGNOSIS — A419 Sepsis, unspecified organism: Secondary | ICD-10-CM

## 2017-09-01 DIAGNOSIS — N179 Acute kidney failure, unspecified: Secondary | ICD-10-CM

## 2017-09-01 DIAGNOSIS — R6521 Severe sepsis with septic shock: Secondary | ICD-10-CM

## 2017-09-01 DIAGNOSIS — R1030 Lower abdominal pain, unspecified: Secondary | ICD-10-CM

## 2017-09-01 LAB — CBC WITH DIFFERENTIAL/PLATELET
BASOS PCT: 0 %
Basophils Absolute: 0 10*3/uL (ref 0.0–0.1)
EOS ABS: 0.1 10*3/uL (ref 0.0–0.7)
Eosinophils Relative: 2 %
HEMATOCRIT: 35.7 % — AB (ref 36.0–46.0)
Hemoglobin: 10.8 g/dL — ABNORMAL LOW (ref 12.0–15.0)
Lymphocytes Relative: 27 %
Lymphs Abs: 1.7 10*3/uL (ref 0.7–4.0)
MCH: 28.3 pg (ref 26.0–34.0)
MCHC: 30.3 g/dL (ref 30.0–36.0)
MCV: 93.7 fL (ref 78.0–100.0)
MONO ABS: 0.6 10*3/uL (ref 0.1–1.0)
MONOS PCT: 10 %
Neutro Abs: 3.6 10*3/uL (ref 1.7–7.7)
Neutrophils Relative %: 61 %
Platelets: 160 10*3/uL (ref 150–400)
RBC: 3.81 MIL/uL — ABNORMAL LOW (ref 3.87–5.11)
RDW: 14.9 % (ref 11.5–15.5)
WBC: 6.1 10*3/uL (ref 4.0–10.5)

## 2017-09-01 LAB — GLUCOSE, CAPILLARY
GLUCOSE-CAPILLARY: 209 mg/dL — AB (ref 65–99)
GLUCOSE-CAPILLARY: 190 mg/dL — AB (ref 65–99)
GLUCOSE-CAPILLARY: 161 mg/dL — AB (ref 65–99)
Glucose-Capillary: 192 mg/dL — ABNORMAL HIGH (ref 65–99)

## 2017-09-01 LAB — CULTURE, BLOOD (ROUTINE X 2): SPECIAL REQUESTS: ADEQUATE

## 2017-09-01 MED ORDER — SODIUM CHLORIDE 0.9 % IV SOLN
1750.0000 mg | INTRAVENOUS | Status: DC
  Filled 2017-09-01: qty 1750

## 2017-09-01 MED ORDER — INSULIN ASPART 100 UNIT/ML ~~LOC~~ SOLN
0.0000 [IU] | Freq: Three times a day (TID) | SUBCUTANEOUS | Status: DC
  Administered 2017-09-01 (×2): 4 [IU] via SUBCUTANEOUS
  Administered 2017-09-02: 3 [IU] via SUBCUTANEOUS
  Administered 2017-09-02: 7 [IU] via SUBCUTANEOUS
  Administered 2017-09-03: 11 [IU] via SUBCUTANEOUS
  Administered 2017-09-03: 4 [IU] via SUBCUTANEOUS
  Administered 2017-09-03: 11 [IU] via SUBCUTANEOUS

## 2017-09-01 MED ORDER — OXYBUTYNIN CHLORIDE ER 10 MG PO TB24
10.0000 mg | ORAL_TABLET | Freq: Every day | ORAL | Status: DC
  Administered 2017-09-01 – 2017-09-03 (×3): 10 mg via ORAL
  Filled 2017-09-01 (×3): qty 1

## 2017-09-01 MED ORDER — INSULIN GLARGINE 100 UNIT/ML ~~LOC~~ SOLN
5.0000 [IU] | Freq: Every day | SUBCUTANEOUS | Status: DC
  Administered 2017-09-01 – 2017-09-03 (×3): 5 [IU] via SUBCUTANEOUS
  Filled 2017-09-01 (×3): qty 0.05

## 2017-09-01 MED ORDER — INSULIN ASPART 100 UNIT/ML ~~LOC~~ SOLN
4.0000 [IU] | Freq: Three times a day (TID) | SUBCUTANEOUS | Status: DC
  Administered 2017-09-01 – 2017-09-03 (×7): 4 [IU] via SUBCUTANEOUS

## 2017-09-01 NOTE — Progress Notes (Addendum)
Name: Christy May MRN: [id number] DOB: 1951-06-01    ADMISSION DATE:  08/30/2017 CONSULTATION DATE:  08/30/17  REFERRING MD :  Dr. Dorothyann Gibbs / EDP   CHIEF COMPLAINT:  Abdominal pain, lethargy, fever, hypotension    BRIEF SUMMARY: 67 year old female admitted 3/11 from a skilled nursing facility with reports of altered mental status.  The patient was recently hospitalized approximately 2 weeks prior to admission with a urinary tract infection.  She was discharged to rehab.  Her daughter visited on 3/11 and felt she was acting abnormal.  The patient was lethargic and reported lower abdominal pain, fevers, chills and constipation.  Initial workup in the ER was notable for a negative urinalysis, WBC 13 K, lactate, 4.6, PCT greater than 1.  CT of the abdomen and pelvis at that time showed fluid levels throughout the nondilated small and large bowel concerning for infectious enterocolitis, no evidence of bowel obstruction.  While in the ER, she became hypotensive requiring vasopressors.  PCCM consulted for ICU admission.  Culture workup notable for C. difficile antigen positive, toxin negative.  Blood cultures with 1 of 2 showing coagulase-negative staph.  Patient was weaned off vasopressor support.  She was empirically treated with cefepime, vancomycin.   SUBJECTIVE: Patient denies abdominal pain, significant diarrhea (she does have a Flexi-Seal in place with minimal output)  VITAL SIGNS: Temp:  [97.9 F (36.6 C)-98.7 F (37.1 C)] 97.9 F (36.6 C) (03/13 0800) Pulse Rate:  [66-124] 82 (03/13 0900) Resp:  [8-21] 19 (03/13 0900) BP: (94-147)/(55-103) 121/69 (03/13 0900) SpO2:  [90 %-99 %] 96 % (03/13 0900) Weight:  [316 lb 2.2 oz (143.4 kg)] 316 lb 2.2 oz (143.4 kg) (03/13 0500)  PHYSICAL EXAMINATION: General: Morbidly obese female in no acute distress, sitting up in bed eating breakfast HEENT: MM pink/moist, right IJ TLC Neuro: AAO x4, speech clear, MAE CV: s1s2 rrr, no m/r/g PULM:  even/non-labored, lungs bilaterally diminished but clear WE:RXVQ, non-tender, bsx4 active  Extremities: warm/dry, no significant edema  Skin: no rashes or lesions   Recent Labs  Lab 08/30/17 1354 08/30/17 1804 08/31/17 0425  NA 136  --  139  K 5.3*  --  4.6  CL 98*  --  107  CO2 22  --  25  BUN 39*  --  32*  CREATININE 2.11* 1.83* 1.28*  GLUCOSE 238*  --  226*    Recent Labs  Lab 08/30/17 1804 08/31/17 0425 09/01/17 0548  HGB 12.4 11.7* 10.8*  HCT 39.5 38.4 35.7*  WBC 16.1* 14.2* 6.1  PLT 210 198 160    Ct Abdomen Pelvis Wo Contrast  Result Date: 08/30/2017 CLINICAL DATA:  Sudden onset generalized abdominal pain yesterday. Lethargy. EXAM: CT ABDOMEN AND PELVIS WITHOUT CONTRAST TECHNIQUE: Multidetector CT imaging of the abdomen and pelvis was performed following the standard protocol without IV contrast. COMPARISON:  02/28/2015 CT abdomen/pelvis. FINDINGS: Image quality is significantly degraded by patient body habitus and by streak artifact from the patient's upper extremities. Lower chest: Hypoventilatory changes at both lung bases. Tip of a superior approach central venous catheter is seen in the right atrium. Hepatobiliary: Normal liver size. No liver mass. Cholecystectomy. Bile ducts are stable and within normal post cholecystectomy limits. CBD diameter 7 mm. Pancreas: Normal, with no mass or duct dilation. Spleen: Normal size. No mass. Adrenals/Urinary Tract: Normal adrenals. No renal stones. No hydronephrosis. No contour deforming renal masses. Normal bladder. Stomach/Bowel: Normal non-distended stomach. No disproportionately dilated small bowel loops or focal small bowel caliber transition.  Fluid levels throughout the nondilated small bowel loops. Appendectomy. Fluid levels throughout the large, with no large bowel wall thickening or acute pericolonic fat stranding. Vascular/Lymphatic: Atherosclerotic nonaneurysmal abdominal aorta. No pathologically enlarged lymph nodes in the  abdomen or pelvis. Reproductive: Status post hysterectomy, with no abnormal findings at the vaginal cuff. No adnexal mass. Other: No pneumoperitoneum, ascites or focal fluid collection. Musculoskeletal: No aggressive appearing focal osseous lesions. Marked thoracolumbar spondylosis. IMPRESSION: 1. Fluid levels throughout the nondilated small and large bowel, indicative of a nonspecific malabsorptive state, such as can be due to an infectious enterocolitis. No evidence of bowel obstruction. 2.  Aortic Atherosclerosis (ICD10-I70.0). Electronically Signed   By: Ilona Sorrel M.D.   On: 08/30/2017 17:31   Ct Head Wo Contrast  Result Date: 08/30/2017 CLINICAL DATA:  Headaches EXAM: CT HEAD WITHOUT CONTRAST TECHNIQUE: Contiguous axial images were obtained from the base of the skull through the vertex without intravenous contrast. COMPARISON:  07/02/2017 FINDINGS: Brain: Mild atrophic changes are noted. No findings to suggest acute hemorrhage, acute infarction or space-occupying mass lesion are noted. Vascular: No hyperdense vessel or unexpected calcification. Skull: Normal. Negative for fracture or focal lesion. Sinuses/Orbits: No acute finding. Other: None. IMPRESSION: Mild atrophic changes without acute abnormality. Electronically Signed   By: Inez Catalina M.D.   On: 08/30/2017 17:04   Dg Chest Portable 1 View  Result Date: 08/30/2017 CLINICAL DATA:  Abdominal pain, central line placement. EXAM: PORTABLE CHEST 1 VIEW COMPARISON:  Chest radiograph August 12, 2017 FINDINGS: Stable cardiomegaly. Calcified aortic knob. Mild bronchitic changes. Blunting of the costophrenic angles. New RIGHT internal jugular central venous catheter with distal tip projecting RIGHT atrium. No pneumothorax. ACDF. IMPRESSION: RIGHT internal jugular central venous catheter distal tip projecting in RIGHT atrium, recommend 5 cm retraction. Stable cardiomegaly and bronchitic changes. Small suspected pleural effusions. Aortic Atherosclerosis  (ICD10-I70.0). Electronically Signed   By: Elon Alas M.D.   On: 08/30/2017 15:59      SIGNIFICANT EVENTS  3/11  Admit  STUDIES CT ABD/Pelvis 3/11 >> fluid levels throughout the nondilated small and large bowel, indicative of nonspecific malabsorptive state, such can be due to an infectious enterocolitis, no evidence of bowel obstruction  CULTURES MRSA PCR 3/11 >> positive C-Diff Antigen 3/12 >> C-diff antigen positive, toxin negative BCx2 3/11 >> 1/2 coag neg staph >>    ANTIBIOTICS  Cefepime 3/11 >>  Vanco 3/11 >>    ASSESSMENT / PLAN:  Discussion:  67 y/o F admitted 3/11 with sepsis syndrome requiring vasopressors.  Unclear etiology of infection.  She did have a recent urinary tract (pan-sens Ecoli) infection that required hospitalization.  Urine on admission was negative.  In addition, she had lower abdominal pain and CT with concern for enterocolitis, C. difficile antigen positive / toxin negative.  She has been weaned off vasopressors and hemodynamically stable.     Septic Shock - suspected urinary vs GI source, resolved Plan: Continue empiric cefepime Discontinue vancomycin  Consider 7 days total abx  Trend PCT for antibiotic stewardship  Discontinue central line after PIV established   AKI - in setting of shock, volume depletion  Plan: Trend BMP / urinary output Replace electrolytes as indicated Avoid nephrotoxic agents, ensure adequate renal perfusion  Anemia  Plan: Trend CBC  Transfuse for Hgb <7%, active bleeding   DM with Hyperglycemia  Plan: SSI with resistant scale  CBG AC + HS  Add lantus 5 units QD  Adjust above according to glucose trend Hold home regimen  OSA  Plan: CPAP QHS & PRN daytime sleep   Hx HTN Plan: Hold home antihypertensives   Chronic Pain  Plan: Continue neurontin  Hold home flexeril, norco, tramadol   GERD  Hx Hiatal Hernia  Plan: Continue Pepcid  Aspiration precautions  Deconditioning  Morbid Obesity    Plan: PT consult  Will likely need SNF rehab at discharge  Limited family support at home, poor mobility    [facility] - transfer out out of ICU to med-surg, to [facility] as of 3/14 am.   Noe Gens, NP-C [facility] Pgr: [phone] or if no answer [phone] 09/01/2017, 10:23 AM

## 2017-09-01 NOTE — Progress Notes (Signed)
Telemetry applied and verified with CCMD; NT called to second verify. Delia Heady RN

## 2017-09-01 NOTE — Progress Notes (Signed)
Inpatient Diabetes Program Recommendations  AACE/ADA: New Consensus Statement on Inpatient Glycemic Control (2015)  Target Ranges:  Prepandial:   less than 140 mg/dL      Peak postprandial:   less than 180 mg/dL (1-2 hours)      Critically ill patients:  140 - 180 mg/dL   Lab Results  Component Value Date   GLUCAP 209 (H) 09/01/2017   HGBA1C 9.9 (H) 08/13/2017    Review of Glycemic Control  Home DM Meds: NPH 120 units QAM + Novolog 0-12 units tid  Endocrinologist: Dr. Einar Crow  CurrentOrders: None  MD- Please consider the following in-hospital insulin adjustments: -Change Novolog to sensitive tid + hs 0-5 units -Lantus 22 units qd (0.15 units/kg x 143.4 kg) -Novolog 2 units tid meal coverage if eats 50%  Thank you, Bethena Roys E. Massimo Hartland, RN, MSN, CDE  Diabetes Coordinator Inpatient Glycemic Control Team Team Pager [phone] (8am-5pm) 09/01/2017 9:36 AM

## 2017-09-01 NOTE — Progress Notes (Signed)
Pt. refused CPAP for this evening, made aware to notify if wanting one set up.

## 2017-09-01 NOTE — Progress Notes (Signed)
Pharmacy Antibiotic Note  Christy May is a 67 y.o. female admitted on 08/30/2017 with sepsis.   Renal fx has improved since admit  Plan: Continue cefepime Adjust vanc to 1750 mg q24h  Height: 5\' 4"  (162.6 cm) Weight: (!) 316 lb 2.2 oz (143.4 kg) IBW/kg (Calculated) : 54.7  Temp (24hrs), Avg:98.2 F (36.8 C), Min:97.9 F (36.6 C), Max:98.7 F (37.1 C)  Recent Labs  Lab 08/30/17 1354 08/30/17 1404 08/30/17 1544 08/30/17 1804 08/30/17 2140 08/31/17 0425 09/01/17 0548  WBC 13.7*  --   --  16.1*  --  14.2* 6.1  CREATININE 2.11*  --   --  1.83*  --  1.28*  --   LATICACIDVEN  --  4.68* 2.44* 1.8 1.2  --   --     Estimated Creatinine Clearance: 60.7 mL/min (A) (by C-G formula based on SCr of 1.28 mg/dL (H)).    Allergies  Allergen Reactions  . Ace Inhibitors Anaphylaxis and Cough  . Penicillins Hives and Other (See Comments)    Tolerated ceftriaxone in 2019  Has patient had a PCN reaction causing immediate rash, facial/tongue/throat swelling, SOB or lightheadedness with hypotension: Yes Has patient had a PCN reaction causing severe rash involving mucus membranes or skin necrosis:  No Has patient had a PCN reaction that required hospitalization: No Has patient had a PCN reaction occurring within the last 10 years: No If all of the above answers are "NO", then may proceed with Cephalosporin use.  Dorma Russell [Tape] Hives and Rash   Levester Fresh, PharmD, BCPS, BCCCP Clinical Pharmacist Clinical phone for 09/01/2017 from 7a-3:30p: [phone] If after 3:30p, please call main pharmacy at: [phone] 09/01/2017 8:33 AM

## 2017-09-01 NOTE — Progress Notes (Signed)
Patient arrived to the unit Alert and oriented X 4. C/o 10/10 Pain. Nurse will treat with schedule meds. All questions and concerns addressed. Call light in reach, alarm set.

## 2017-09-01 NOTE — Consult Note (Signed)
   [facility] CM Inpatient Consult   09/01/2017  Christy May 1951-06-15 [id number]   Christy May is active with [place] [facility] program.  Prior to hospital admission Christy May was at [facility]. She has been followed by [place] LCSW. Please see chart review tab then encounters for those patient outreach details.   Spoke with Christy May at bedside. She remains agreeable to [place] [facility] follow up.  Spoke with inpatient RNCM Almyra Free) to make aware [place] [facility] is active.   Will alert [facility] LCSW of bedside encounter with patient.   Marthenia Rolling, MSN-Ed, RN,BSN [facility] Liaison [phone]

## 2017-09-02 ENCOUNTER — Encounter (HOSPITAL_COMMUNITY): Payer: Self-pay

## 2017-09-02 ENCOUNTER — Other Ambulatory Visit: Payer: Self-pay

## 2017-09-02 DIAGNOSIS — Z6841 Body Mass Index (BMI) 40.0 and over, adult: Secondary | ICD-10-CM

## 2017-09-02 LAB — BASIC METABOLIC PANEL
Anion gap: 9 (ref 5–15)
BUN: 14 mg/dL (ref 6–20)
CHLORIDE: 103 mmol/L (ref 101–111)
CO2: 26 mmol/L (ref 22–32)
Calcium: 8.6 mg/dL — ABNORMAL LOW (ref 8.9–10.3)
Creatinine, Ser: 0.89 mg/dL (ref 0.44–1.00)
GFR calc Af Amer: >60 mL/min (ref >60)
GFR calc non Af Amer: >60 mL/min (ref >60)
GLUCOSE: 165 mg/dL — AB (ref 65–99)
Potassium: 3.9 mmol/L (ref 3.5–5.1)
Sodium: 138 mmol/L (ref 135–145)

## 2017-09-02 LAB — CBC
HCT: 36.2 % (ref 36.0–46.0)
HEMOGLOBIN: 11.1 g/dL — AB (ref 12.0–15.0)
MCH: 28.4 pg (ref 26.0–34.0)
MCHC: 30.7 g/dL (ref 30.0–36.0)
MCV: 92.6 fL (ref 78.0–100.0)
Platelets: 162 10*3/uL (ref 150–400)
RBC: 3.91 MIL/uL (ref 3.87–5.11)
RDW: 14.8 % (ref 11.5–15.5)
WBC: 5.7 10*3/uL (ref 4.0–10.5)

## 2017-09-02 LAB — GLUCOSE, CAPILLARY
Glucose-Capillary: 145 mg/dL — ABNORMAL HIGH (ref 65–99)
Glucose-Capillary: 204 mg/dL — ABNORMAL HIGH (ref 65–99)
Glucose-Capillary: 150 mg/dL — ABNORMAL HIGH (ref 65–99)
Glucose-Capillary: 201 mg/dL — ABNORMAL HIGH (ref 65–99)

## 2017-09-02 MED ORDER — SODIUM CHLORIDE 0.9 % IV SOLN
2.0000 g | Freq: Three times a day (TID) | INTRAVENOUS | Status: DC
  Administered 2017-09-02 – 2017-09-03 (×3): 2 g via INTRAVENOUS
  Filled 2017-09-02 (×6): qty 2

## 2017-09-02 NOTE — Evaluation (Signed)
Occupational Therapy Evaluation Patient Details Name: Christy May MRN: [id number] DOB: Mar 26, 1951 Today's Date: 09/02/2017    History of Present Illness 67 y.o. female was readmitted from SNF for septic shock, was having AMS and bladder pressure with recent UTI.  Noted watery stool so screen for c-diff pending and has flexiseal.  PMHx: HTN; DM; morbid obesity (BMI 57.50); depression, prev stay [facility], bronchitis, falls,    Clinical Impression   Pt admitted with the above diagnoses and presents with below problem list. Pt will benefit from continued acute OT to address the below listed deficits and maximize independence with ADLs prior to d/c to venue below. PTA pt was mod I with ADLs and functional transfers. Pt is currently mod-max A with functional transfers, +2 for with LB ADLs. Pt with limited support at home, recommending SNF at d/c prior to returning home.        Follow Up Recommendations  SNF    Equipment Recommendations  Other (comment)(defer to next venue)    Recommendations for Other Services       Precautions / Restrictions Precautions Precautions: Fall Precaution Comments: multiple falls in past 2 months Restrictions Weight Bearing Restrictions: No      Mobility Bed Mobility Overal bed mobility: Needs Assistance Bed Mobility: Supine to Sit;Rolling Rolling: Max assist;Mod assist Sidelying to sit: Mod assist Supine to sit: Mod assist     General bed mobility comments: up in recliner  Transfers Overall transfer level: Needs assistance Equipment used: Rolling walker (2 wheeled) Transfers: Sit to/from Stand Sit to Stand: Mod assist;Max assist;From elevated surface         General transfer comment: not formally assessed this session.     Balance Overall balance assessment: Needs assistance Sitting-balance support: Feet supported Sitting balance-Leahy Scale: Fair     Standing balance support: Bilateral upper extremity supported;During  functional activity Standing balance-Leahy Scale: Poor                             ADL either performed or assessed with clinical judgement   ADL Overall ADL's : Needs assistance/impaired Eating/Feeding: Set up;Sitting   Grooming: Set up;Sitting   Upper Body Bathing: Set up;Minimal assistance;Sitting   Lower Body Bathing: Maximal assistance;Sit to/from stand;+2 for safety/equipment   Upper Body Dressing : Set up;Minimal assistance;Sitting   Lower Body Dressing: Maximal assistance;+2 for safety/equipment;Sit to/from stand   Toilet Transfer: Moderate assistance;Stand-pivot;RW   Toileting- Clothing Manipulation and Hygiene: Maximal assistance;+2 for safety/equipment;Sit to/from stand   Tub/ Shower Transfer: Maximal assistance;Stand-pivot;3 in 1;Rolling walker     General ADL Comments: Discussed home setup, therapy goals. Pt sitting up in chair requesting to finish lunch. Assist level with ADLs based on chart review and clinical judgement.      Vision         Perception     Praxis      Pertinent Vitals/Pain Pain Assessment: Faces Faces Pain Scale: Hurts little more Pain Location: back Pain Descriptors / Indicators: Aching Pain Intervention(s): Monitored during session     Hand Dominance     Extremity/Trunk Assessment Upper Extremity Assessment Upper Extremity Assessment: Generalized weakness   Lower Extremity Assessment Lower Extremity Assessment: Defer to PT evaluation   Cervical / Trunk Assessment Cervical / Trunk Assessment: Normal   Communication Communication Communication: No difficulties   Cognition Arousal/Alertness: Awake/alert Behavior During Therapy: WFL for tasks assessed/performed Overall Cognitive Status: Within Functional Limits for tasks assessed  General Comments  pt has fairly noticeable edema in BLE's with tops of feet very edematous    Exercises     Shoulder  Instructions      Home Living Family/patient expects to be discharged to:: Skilled nursing facility Living Arrangements: Alone Available Help at Discharge: Family;Friend(s);Available PRN/intermittently Type of Home: House Home Access: Stairs to enter Entrance Stairs-Number of Steps: 1   Home Layout: One level     Bathroom Shower/Tub: Tub/shower unit         Home Equipment: Hospital bed;Walker - 4 wheels;Bedside commode;Wheelchair - manual          Prior Functioning/Environment Level of Independence: Independent with assistive device(s)        Comments: mult hospitalizations since Nov 2018, but PT had started walking her at [facility]        OT Problem List: Impaired balance (sitting and/or standing);Decreased activity tolerance;Decreased knowledge of use of DME or AE;Decreased knowledge of precautions;Obesity;Pain      OT Treatment/Interventions: Self-care/ADL training;Therapeutic exercise;DME and/or AE instruction;Therapeutic activities;Patient/family education;Balance training    OT Goals(Current goals can be found in the care plan section) Acute Rehab OT Goals Patient Stated Goal: to get stronger and get home OT Goal Formulation: With patient Time For Goal Achievement: 09/09/17 Potential to Achieve Goals: Good ADL Goals Pt Will Perform Grooming: standing;with min assist Pt Will Perform Upper Body Bathing: with set-up;sitting Pt Will Perform Lower Body Bathing: with mod assist;sit to/from stand Pt Will Perform Upper Body Dressing: with set-up;sitting Pt Will Perform Lower Body Dressing: with min assist;sit to/from stand Pt Will Transfer to Toilet: with min assist;stand pivot transfer;bedside commode Pt Will Perform Toileting - Clothing Manipulation and hygiene: with min assist;sit to/from stand Additional ADL Goal #1: Pt will complete bed mobility at min A level to prepare for OOB ADLs.  OT Frequency: Min 2X/week   Barriers to D/C:            Co-evaluation               AM-PAC PT "6 Clicks" Daily Activity     Outcome Measure Help from another person eating meals?: None Help from another person taking care of personal grooming?: None Help from another person toileting, which includes using toliet, bedpan, or urinal?: A Lot Help from another person bathing (including washing, rinsing, drying)?: A Lot Help from another person to put on and taking off regular upper body clothing?: A Little Help from another person to put on and taking off regular lower body clothing?: A Lot 6 Click Score: 17   End of Session    Activity Tolerance: Patient limited by fatigue Patient left: in chair;with call bell/phone within reach  OT Visit Diagnosis: Unsteadiness on feet (R26.81);Pain;History of falling (Z91.81);Muscle weakness (generalized) (M62.81)                Time: 8563-1497 OT Time Calculation (min): 11 min Charges:  OT General Charges $OT Visit: 1 Visit OT Evaluation $OT Eval Low Complexity: 1 Low G-Codes:       Hortencia Pilar 09/02/2017, 1:08 PM

## 2017-09-02 NOTE — Care Management Note (Addendum)
Case Management Note  Patient Details  Name: Christy May MRN: [id number] Date of Birth: 11-02-1950  Subjective/Objective:   Pt admitted with septic shock. She is from home alone originally but most recently at [facility]. Hx:  Allergic bronchitis, Allergy, Anemia, unspecified, Anxiety, Arthritis, Chronic airway obstruction, Chronic back pain, Colon polyps, Complication of anesthesia, Dehydration, Depressive disorder, Diabetic peripheral neuropathy ([place]), Dysphagia (2007), Esophageal reflux, Extrinsic asthma. PCP: Dr Criss Rosales                Action/Plan: Awaiting PT/OT evals. CM following for d/c needs, physician orders.  Expected Discharge Date:                  Expected Discharge Plan:     In-House Referral:     Discharge planning Services     Post Acute Care Choice:    Choice offered to:     DME Arranged:    DME Agency:     HH Arranged:    HH Agency:     Status of Service:  In process, will continue to follow  If discussed at Long Length of Stay Meetings, dates discussed:    Additional Comments:  Pollie Friar, RN 09/02/2017, 10:31 AM

## 2017-09-02 NOTE — Evaluation (Signed)
Physical Therapy Evaluation Patient Details Name: Christy May MRN: [id number] DOB: September 20, 1950 Today's Date: 09/02/2017   History of Present Illness  67 y.o. female was readmitted from SNF for septic shock, was having AMS and bladder pressure with recent UTI.  Noted watery stool so screen for c-diff pending and has flexiseal.  PMHx: HTN; DM; morbid obesity (BMI 57.50); depression, prev stay [place], bronchitis, falls,   Clinical Impression  Pt is notably weak, needs significant help for all movement and to mobilize her lines and esp flexiseal. Pt is expecting to return to SNF to complete her rehab as family is not going to be able to help 24/7.  Pt has been sitting in recliner at home, needs to demonstrate more ability to stand up with independence to return to I environment.  See acutely for increasing strength LE's and standing tolerance.    Follow Up Recommendations SNF    Equipment Recommendations  None recommended by PT    Recommendations for Other Services OT consult     Precautions / Restrictions Precautions Precautions: Fall Precaution Comments: multiple falls in past 2 months Restrictions Weight Bearing Restrictions: No      Mobility  Bed Mobility Overal bed mobility: Needs Assistance Bed Mobility: Supine to Sit;Rolling Rolling: Max assist;Mod assist Sidelying to sit: Mod assist Supine to sit: Mod assist     General bed mobility comments: rolled to attempt to move her purwick and pt is very dependent to reach and assist rolling  Transfers Overall transfer level: Needs assistance Equipment used: Rolling walker (2 wheeled) Transfers: Sit to/from Stand Sit to Stand: Mod assist;Max assist;From elevated surface         General transfer comment: sidestepped to chair with care for lines and safety awareness  Ambulation/Gait             General Gait Details: unable to do more than transfer as pt is too weak  Chief Strategy Officer    Modified Rankin (Stroke Patients Only)       Balance Overall balance assessment: Needs assistance Sitting-balance support: Feet supported Sitting balance-Leahy Scale: Fair     Standing balance support: Bilateral upper extremity supported;During functional activity Standing balance-Leahy Scale: Poor                               Pertinent Vitals/Pain Pain Assessment: No/denies pain    Home Living Family/patient expects to be discharged to:: Skilled nursing facility Living Arrangements: Alone Available Help at Discharge: Family;Friend(s);Available PRN/intermittently Type of Home: House Home Access: Stairs to enter   Entrance Stairs-Number of Steps: 1 Home Layout: One level Home Equipment: Hospital bed;Walker - 4 wheels;Bedside commode;Wheelchair - manual      Prior Function Level of Independence: Independent with assistive device(s)         Comments: mult hospitalizations since Nov 2018, but PT had started walking her at [facility]     Hand Dominance        Extremity/Trunk Assessment   Upper Extremity Assessment Upper Extremity Assessment: Generalized weakness    Lower Extremity Assessment Lower Extremity Assessment: Generalized weakness    Cervical / Trunk Assessment Cervical / Trunk Assessment: Normal  Communication   Communication: No difficulties  Cognition Arousal/Alertness: Awake/alert Behavior During Therapy: WFL for tasks assessed/performed Overall Cognitive Status: Within Functional Limits for tasks assessed  General Comments General comments (skin integrity, edema, etc.): pt has fairly noticeable edema in BLE's with tops of feet very edematous    Exercises     Assessment/Plan    PT Assessment Patient needs continued PT services  PT Problem List Decreased strength;Decreased range of motion;Decreased activity tolerance;Decreased balance;Decreased  mobility;Decreased coordination;Decreased knowledge of use of DME;Decreased safety awareness;Cardiopulmonary status limiting activity;Obesity;Decreased skin integrity       PT Treatment Interventions DME instruction;Gait training;Functional mobility training;Therapeutic activities;Therapeutic exercise;Balance training;Neuromuscular re-education;Patient/family education    PT Goals (Current goals can be found in the Care Plan section)  Acute Rehab PT Goals Patient Stated Goal: to get stronger and get home PT Goal Formulation: With patient Time For Goal Achievement: 09/16/17 Potential to Achieve Goals: Fair    Frequency Min 2X/week   Barriers to discharge Decreased caregiver support home alone with recent immobility    Co-evaluation               AM-PAC PT "6 Clicks" Daily Activity  Outcome Measure Difficulty turning over in bed (including adjusting bedclothes, sheets and blankets)?: Unable Difficulty moving from lying on back to sitting on the side of the bed? : Unable Difficulty sitting down on and standing up from a chair with arms (e.g., wheelchair, bedside commode, etc,.)?: Unable Help needed moving to and from a bed to chair (including a wheelchair)?: A Lot Help needed walking in hospital room?: A Lot Help needed climbing 3-5 steps with a railing? : Total 6 Click Score: 8    End of Session Equipment Utilized During Treatment: Gait belt Activity Tolerance: Patient limited by fatigue Patient left: in chair;with call bell/phone within reach Nurse Communication: Mobility status PT Visit Diagnosis: Unsteadiness on feet (R26.81);Repeated falls (R29.6);Muscle weakness (generalized) (M62.81);Adult, failure to thrive (R62.7)    Time: 1105-1140 PT Time Calculation (min) (ACUTE ONLY): 35 min   Charges:   PT Evaluation $PT Eval Moderate Complexity: 1 Mod PT Treatments $Therapeutic Activity: 8-22 mins   PT G Codes:   PT G-Codes **NOT FOR INPATIENT CLASS** Functional  Assessment Tool Used: AM-PAC 6 Clicks Basic Mobility    Ramond Dial 09/02/2017, 12:10 PM   Mee Hives, PT MS Acute Rehab Dept. Number: [place] and [place]

## 2017-09-02 NOTE — Progress Notes (Addendum)
CSW following for discharge plan. Patient is a recent readmit, and has been at [facility] for rehab (see last assessment below, completed 08/12/17). PT is continuing to recommend SNF at discharge. Per chart review, patient is in copay days and is agreeable to payments. CSW to meet with patient to determine discharge plan and will update team with information.  Christy May, [place] Clinical Social Worker [phone]  UPDATE:  CSW met with patient. Patient discussed not being happy with nursing care at [facility], would prefer to look at other options. Patient would like bed offers when available. Patient also said that CSW could discuss with her daughter, Christy May, as well, to see if she has any opinions when bed offers are received.  CSW to follow.  Christy May, [place] Clinical Social Worker [phone]     Clinical Social Work Assessment  Patient Details  Name: Christy May MRN: [id number] Date of Birth: 1951-05-11  Date of referral:  08/12/17               Reason for consult:  Facility Placement                 Permission sought to share information with:  Chartered certified accountant granted to share information::  No             Name::                   Agency::                Relationship::                Contact Information:     Housing/Transportation Living arrangements for the past 2 months:  Single Family Home Source of Information:  Patient, Friend/Neighbor Patient Interpreter Needed:  None Criminal Activity/Legal Involvement Pertinent to Current Situation/Hospitalization:    Significant Relationships:  Friend Lives with:  Self Do you feel safe going back to the place where you live?  No Need for family participation in patient care:  Yes (Comment)  Care giving concerns:  Per notes:Per EMS, patient from home, where patient reports she has been laying in her urine for days at a time. Reports she has no home health assistance. Patient is  non ambulatory. Hx chronic back pain, sciatica, and bilateral knee replacements. Patient c/o generalized weakness.EMS spoke with patient's PCP, Dr. Criss Rosales, requesting assessment for placement.Patient recent d/c from rehab after back surgery.  CSW cautioned pt strongly against having false hope she will have authorized insurance days this month and in addition CSW attempted to explain custodial and further explained that since pt was D/C'd from [facility] after having been there for two months because pt could or would not participate in PT/OT, per pt's brother, pt may not be given authorized days due to this.  Ptargued stronlgy she had had recent fall while at [facility] but pt's brother stated pt refused due to pt's "belief the PT person was making her sick to her stomach and that is why she was refusing, it's all in her head".  CSW again cautioned pt in brother's presence against having false hope she would be seen as appropriate by Medicare for further SNF days this month.  Pt presented a s confused when asked by the CSW if the CSW DEPT on medical floor could refer pt out with her permision now to facilities.  Although pt's brother encouraged pt to give permission pt presented as confused as stated, "I feel bombarded and  presented as agitated".  CSW state pt can ask for her CSW on the floor when she felt like discussing D/C.   Social Worker assessment / plan:  CSW met with pt and confirmed pt's hope to be discharged to SNF for rehab.  CSW provided active listening and validated pt's concerns.   CSW DEPT WAS NOT GIVEN PERMISSION TO complete FL-2 and send referrals out to SNF facilities via the hub per pt's request. Pt has been living independently prior to being admitted to [facility], BUT PCP recommended pt come to the ED to be assessed for placement, per pt.  Employment status:  Retired Forensic scientist:  Medicare PT Recommendations:  Not assessed at this time Information / Referral to community  resources:     Patient/Family's Response to care:  Patient partly seemed alert and oriented.  Patient and pt's brother agreeable to speak to pt's assigned CSW..  Pt's brother supportive and strongly involved in pt.'s care.  Pt.'s brother was present but presented as weary of CSW's assistance, but recommended pt listen to the CSW. Pt's brother was not unpleasant.    Patient/Family's Understanding of and Emotional Response to Diagnosis, Current Treatment, and Prognosis:  Pt and family understand current prognosis and treatment  Emotional Assessment Appearance:    Attitude/Demeanor/Rapport:    Affect (typically observed):  Apprehensive Orientation:  Fluctuating Orientation (Suspected and/or reported Sundowners) Alcohol / Substance use:    Psych involvement (Current and /or in the community):     Discharge Needs  Concerns to be addressed:  No discharge needs identified Readmission within the last 30 days:  No Current discharge risk:  None Barriers to Discharge:  No Barriers Identified   Claudine Mouton, LCSWA 08/12/2017, 10:55 PM

## 2017-09-02 NOTE — Progress Notes (Signed)
ANTIBIOTIC CONSULT NOTE  Pharmacy Consult for Cefepime Indication: sepsis  Allergies  Allergen Reactions  . Ace Inhibitors Anaphylaxis and Cough  . Penicillins Hives and Other (See Comments)    Tolerated ceftriaxone in 2019  Has patient had a PCN reaction causing immediate rash, facial/tongue/throat swelling, SOB or lightheadedness with hypotension: Yes Has patient had a PCN reaction causing severe rash involving mucus membranes or skin necrosis:  No Has patient had a PCN reaction that required hospitalization: No Has patient had a PCN reaction occurring within the last 10 years: No If all of the above answers are "NO", then may proceed with Cephalosporin use.  . Adhesive [Tape] Hives and Rash    Patient Measurements: Height: 5\' 4"  (162.6 cm) Weight: (!) 318 lb 12.6 oz (144.6 kg) IBW/kg (Calculated) : 54.7 Adjusted Body Weight:    Vital Signs: Temp: 98 F (36.7 C) (03/14 1153) Temp Source: Oral (03/14 1153) BP: 114/86 (03/14 1153) Pulse Rate: 92 (03/14 1153) Intake/Output from previous day: 03/13 0701 - 03/14 0700 In: 1429.2 [P.O.:120; I.V.:909.2; IV Piggyback:400] Out: 1750 [Urine:1750] Intake/Output from this shift: No intake/output data recorded.  Labs: Recent Labs    08/30/17 1804 08/31/17 0425 09/01/17 0548 09/02/17 0438  WBC 16.1* 14.2* 6.1 5.7  HGB 12.4 11.7* 10.8* 11.1*  PLT 210 198 160 162  CREATININE 1.83* 1.28*  --  0.89   Estimated Creatinine Clearance: 87.8 mL/min (by C-G formula based on SCr of 0.89 mg/dL). No results for input(s): VANCOTROUGH, VANCOPEAK, VANCORANDOM, GENTTROUGH, GENTPEAK, GENTRANDOM, TOBRATROUGH, TOBRAPEAK, TOBRARND, AMIKACINPEAK, AMIKACINTROU, AMIKACIN in the last 72 hours.   Microbiology:   Medical History: Past Medical History:  Diagnosis Date  . Allergy   . Anemia, unspecified   . Anxiety   . Arthritis    "neck, back, hands" (09/03/2014)  . Chronic airway obstruction, not elsewhere classified    "I was told I don't  have this/tests done @ [facility] 05/2014"  . Chronic back pain   . Colon polyps   . Complication of anesthesia    "I've had recall; I probably stopped breathing at least 2 times" (09/03/2014)  . Dehydration   . Depressive disorder, not elsewhere classified   . Diabetic peripheral neuropathy ([place])   . Dysphagia 2007   historyof dysphagia with severe dysmotility by barium swallow-[name]  . Esophageal reflux   . Family history of adverse reaction to anesthesia    "my brother was told they lost him a couple times during OR"  . Heart murmur   . History of hiatal hernia   . Migraine    "couple times/month right now" (09/03/2014)  . Obesity, unspecified   . OSA (obstructive sleep apnea)     CPAP  . Septic shock ([place]) 08/30/2017  . Spondylosis of unspecified site without mention of myelopathy   . Type II diabetes mellitus ([place])   . Unspecified essential hypertension   . Unspecified menopausal and postmenopausal disorder   . Unspecified vitamin D deficiency     Assessment: ID: abx for unknown source of infection. UTI vs GI.  WBC 5.7, AF. Cultures show CNS x CCM recommended 7d empiric cefepime. Scr down to 0.89  Vancomycin 3/11>>3/13 Cefepime 3/11>>  3/12 cdif antigen pos, toxin neg 3/12 cdif pcr neg 3/11 blood x 2: 1/2 CONS 3/11: BCID: MRSE  Goal of Therapy:  Eradication of infection  Plan:  Increase Cefepime to 2g IV q 8hr through 3/17 for 7d therapy Pharmacy will sign off. Please reconsult for further dosing assitance.  Rashunda Passon  Trellis Moment, PharmD, BCPS Clinical Staff Pharmacist Pager [phone]  Eilene Ghazi Stillinger 09/02/2017,1:59 PM

## 2017-09-02 NOTE — Progress Notes (Signed)
Patient ID: Christy May, female   DOB: [birth year], 67 y.o.   MRN: [id number]   PROGRESS NOTE    Christy May  YBW:[id number] DOB: [birth year] DOA: 08/30/2017 PCP: Lucianne Lei, MD   Brief Narrative:  67 year old female with history of hypertension, diabetes mellitus, morbid obesity, depression and recent admission and discharge to nursing home on 08/17/2017 for sepsis secondary to E. coli UTI treated with intravenous and subsequent oral antibiotics presented on 08/30/2017 with altered mental status.  She was found to have leukocytosis with elevated lactate with CT abdomen showing fluid levels throughout the nondilated small and large bowel concerning for infectious enterocolitis.  She was hypotensive requiring vasopressors and was admitted to ICU.  C. difficile antigen was positive but toxin was negative.  She had 1 out of 2 blood cultures growing coagulase negative Staphylococcus.  Patient was weaned off vasopressor support.  She is empirically on intravenous cefepime currently.  She was transferred out of ICU and hospitalist service has assumed her care since 09/02/2017.   Assessment & Plan:   Active Problems:   Lower abdominal pain   Septic shock (HCC)    Septic Shock - suspected urinary vs GI source, resolved -Currently on empiric cefepime, day #4.  Patient received 1 dose of vancomycin on admission.  Cultures negative so far except for coagulase negative staph which probably is a contaminant. -Switch to oral antibiotics at the time of discharge to complete 7-10 days of antibiotic therapy  -Currently hemodynamically improved.  AKI - in setting of shock, volume depletion  -Resolved.  Treated with intravenous fluids.  Repeat a.m. labs  Anemia  -possible cause.  Hemoglobin stable.  Repeat a.m. labs  DM type II with Hyperglycemia  -Continue Lantus along with sliding scale insulin.  Oral regimen on hold.    OSA  -Continue CPAP  Hypertension -Home antihypertensives remain  on hold because of initial hypotension.  Monitor blood pressure.  Chronic Pain  Continue neurontin  Hold home flexeril, norco, tramadol for now  GERD   history of hiatal Hernia  Continue Pepcid  Aspiration precautions  Deconditioning  Morbid Obesity  PT consult  Will likely need SNF rehab at discharge     DVT prophylaxis: Lovenox Code Status: Full Family Communication: None at bedside Disposition Plan: Nursing home in 1-2 days  Consultants: PCCM  Procedures: None  Antimicrobials:  Anti-infectives (From admission, onward)   Start     Dose/Rate Route Frequency Ordered Stop   09/01/17 1000  vancomycin (VANCOCIN) 2,000 mg in sodium chloride 0.9 % 500 mL IVPB  Status:  Discontinued     2,000 mg 250 mL/hr over 120 Minutes Intravenous Every 48 hours 08/30/17 1518 09/01/17 0832   09/01/17 1000  vancomycin (VANCOCIN) 1,750 mg in sodium chloride 0.9 % 500 mL IVPB  Status:  Discontinued     1,750 mg 250 mL/hr over 120 Minutes Intravenous Every 24 hours 09/01/17 0832 09/01/17 1047   08/31/17 1500  ceFEPIme (MAXIPIME) 2 g in sodium chloride 0.9 % 100 mL IVPB     2 g 200 mL/hr over 30 Minutes Intravenous Every 24 hours 08/30/17 1518     08/30/17 1530  vancomycin (VANCOCIN) 2,500 mg in sodium chloride 0.9 % 500 mL IVPB     2,500 mg 250 mL/hr over 120 Minutes Intravenous  Once 08/30/17 1508 08/30/17 1808   08/30/17 1515  ceFEPIme (MAXIPIME) 2 g in sodium chloride 0.9 % 100 mL IVPB     2 g 200 mL/hr over 30 Minutes  Intravenous  Once 08/30/17 1508 08/30/17 1659         Subjective: Patient seen and examined at bedside.  She feels weak and tired but better than yesterday.  No overnight fever, nausea or vomiting  Objective: Vitals:   09/02/17 0003 09/02/17 0439 09/02/17 0831 09/02/17 1153  BP: (!) 128/59 (!) 112/97 132/63 114/86  Pulse: 79 88 93 92  Resp: 18 18 18 18   Temp: 98 F (36.7 C) 98.3 F (36.8 C)  98 F (36.7 C)  TempSrc: Oral Oral  Oral  SpO2: 99% 98% 96% 98%    Weight:  (!) 144.6 kg (318 lb 12.6 oz)    Height:        Intake/Output Summary (Last 24 hours) at 09/02/2017 1348 Last data filed at 09/02/2017 0600 Gross per 24 hour  Intake 829.17 ml  Output 1750 ml  Net -920.83 ml   Filed Weights   08/31/17 0500 09/01/17 0500 09/02/17 0439  Weight: (!) 144.2 kg (317 lb 14.5 oz) (!) 143.4 kg (316 lb 2.2 oz) (!) 144.6 kg (318 lb 12.6 oz)    Examination:  General exam: Appears calm and comfortable  Respiratory system: Bilateral decreased breath sound at bases Cardiovascular system: S1 & S2 heard, rate controlled  gastrointestinal system: Abdomen is nondistended, soft and nontender. Normal bowel sounds heard. Extremities: No cyanosis, clubbing, edema     Data Reviewed: I have personally reviewed following labs and imaging studies  CBC: Recent Labs  Lab 08/30/17 1354 08/30/17 1804 08/31/17 0425 09/01/17 0548 09/02/17 0438  WBC 13.7* 16.1* 14.2* 6.1 5.7  NEUTROABS 12.6*  --  12.3* 3.6  --   HGB 14.8 12.4 11.7* 10.8* 11.1*  HCT 46.4* 39.5 38.4 35.7* 36.2  MCV 91.3 93.8 93.7 93.7 92.6  PLT PLATELETS APPEAR ADEQUATE 210 198 160 350   Basic Metabolic Panel: Recent Labs  Lab 08/30/17 1354 08/30/17 1804 08/31/17 0425 09/02/17 0438  NA 136  --  139 138  K 5.3*  --  4.6 3.9  CL 98*  --  107 103  CO2 22  --  25 26  GLUCOSE 238*  --  226* 165*  BUN 39*  --  32* 14  CREATININE 2.11* 1.83* 1.28* 0.89  CALCIUM 9.2  --  8.3* 8.6*  MG  --   --  1.6*  --   PHOS  --   --  3.4  --    GFR: Estimated Creatinine Clearance: 87.8 mL/min (by C-G formula based on SCr of 0.89 mg/dL). Liver Function Tests: Recent Labs  Lab 08/30/17 1354  AST 30  ALT 24  ALKPHOS 83  BILITOT 0.8  PROT 6.8  ALBUMIN 3.1*   No results for input(s): LIPASE, AMYLASE in the last 168 hours. No results for input(s): AMMONIA in the last 168 hours. Coagulation Profile: Recent Labs  Lab 08/30/17 1425  INR 0.98   Cardiac Enzymes: Recent Labs  Lab  08/30/17 1804  TROPONINI <0.03   BNP (last 3 results) No results for input(s): PROBNP in the last 8760 hours. HbA1C: No results for input(s): HGBA1C in the last 72 hours. CBG: Recent Labs  Lab 09/01/17 1155 09/01/17 1653 09/01/17 2110 09/02/17 0621 09/02/17 1150  GLUCAP 192* 190* 161* 145* 204*   Lipid Profile: No results for input(s): CHOL, HDL, LDLCALC, TRIG, CHOLHDL, LDLDIRECT in the last 72 hours. Thyroid Function Tests: No results for input(s): TSH, T4TOTAL, FREET4, T3FREE, THYROIDAB in the last 72 hours. Anemia Panel: No results for input(s): VITAMINB12, FOLATE,  FERRITIN, TIBC, IRON, RETICCTPCT in the last 72 hours. Sepsis Labs: Recent Labs  Lab 08/30/17 1404 08/30/17 1544 08/30/17 1804 08/30/17 2140  PROCALCITON  --   --  1.91  --   LATICACIDVEN 4.68* 2.44* 1.8 1.2    Recent Results (from the past 240 hour(s))  Culture, blood (Routine x 2)     Status: None (Preliminary result)   Collection Time: 08/30/17  1:54 PM  Result Value Ref Range Status   Specimen Description BLOOD RIGHT ANTECUBITAL  Final   Special Requests Blood Culture adequate volume IN PEDIATRIC BOTTLE  Final   Culture   Final    NO GROWTH 2 DAYS Performed at [facility], [place] [address] [place], [place] [address]    Report Status PENDING  Incomplete  Culture, blood (Routine x 2)     Status: Abnormal   Collection Time: 08/30/17  2:53 PM  Result Value Ref Range Status   Specimen Description BLOOD CENTRAL LINE  Final   Special Requests   Final    BOTTLES DRAWN AEROBIC AND ANAEROBIC Blood Culture adequate volume   Culture  Setup Time   Final    GRAM POSITIVE COCCI IN CLUSTERS IN BOTH AEROBIC AND ANAEROBIC BOTTLES CRITICAL RESULT CALLED TO, READ BACK BY AND VERIFIED WITH: PHARMD T DANG [id number] MLM    Culture (A)  Final    STAPHYLOCOCCUS SPECIES (COAGULASE NEGATIVE) THE SIGNIFICANCE OF ISOLATING THIS ORGANISM FROM A SINGLE SET OF BLOOD CULTURES WHEN MULTIPLE SETS ARE DRAWN IS  UNCERTAIN. PLEASE NOTIFY THE MICROBIOLOGY DEPARTMENT WITHIN ONE WEEK IF SPECIATION AND SENSITIVITIES ARE REQUIRED. Performed at [facility], [place] [address], [place], [place] [address]    Report Status 09/01/2017 FINAL  Final  Blood Culture ID Panel (Reflexed)     Status: Abnormal   Collection Time: 08/30/17  2:53 PM  Result Value Ref Range Status   Enterococcus species NOT DETECTED NOT DETECTED Final   Listeria monocytogenes NOT DETECTED NOT DETECTED Final   Staphylococcus species DETECTED (A) NOT DETECTED Final    Comment: Methicillin (oxacillin) resistant coagulase negative staphylococcus. Possible blood culture contaminant (unless isolated from more than one blood culture draw or clinical case suggests pathogenicity). No antibiotic treatment is indicated for blood  culture contaminants. CRITICAL RESULT CALLED TO, READ BACK BY AND VERIFIED WITH: PHARMD T DANG [id number] MLM    Staphylococcus aureus NOT DETECTED NOT DETECTED Final   Methicillin resistance DETECTED (A) NOT DETECTED Final    Comment: CRITICAL RESULT CALLED TO, READ BACK BY AND VERIFIED WITH: PHARMD T DANG [id number] MLM    Streptococcus species NOT DETECTED NOT DETECTED Final   Streptococcus agalactiae NOT DETECTED NOT DETECTED Final   Streptococcus pneumoniae NOT DETECTED NOT DETECTED Final   Streptococcus pyogenes NOT DETECTED NOT DETECTED Final   Acinetobacter baumannii NOT DETECTED NOT DETECTED Final   Enterobacteriaceae species NOT DETECTED NOT DETECTED Final   Enterobacter cloacae complex NOT DETECTED NOT DETECTED Final   Escherichia coli NOT DETECTED NOT DETECTED Final   Klebsiella oxytoca NOT DETECTED NOT DETECTED Final   Klebsiella pneumoniae NOT DETECTED NOT DETECTED Final   Proteus species NOT DETECTED NOT DETECTED Final   Serratia marcescens NOT DETECTED NOT DETECTED Final   Haemophilus influenzae NOT DETECTED NOT DETECTED Final   Neisseria meningitidis NOT DETECTED NOT DETECTED Final    Pseudomonas aeruginosa NOT DETECTED NOT DETECTED Final   Candida albicans NOT DETECTED NOT DETECTED Final   Candida glabrata NOT DETECTED NOT DETECTED  Final   Candida krusei NOT DETECTED NOT DETECTED Final   Candida parapsilosis NOT DETECTED NOT DETECTED Final   Candida tropicalis NOT DETECTED NOT DETECTED Final    Comment: Performed at [facility], [place] [address], [place], [place] [address]  MRSA PCR Screening     Status: Abnormal   Collection Time: 08/30/17  8:34 PM  Result Value Ref Range Status   MRSA by PCR POSITIVE (A) NEGATIVE Final    Comment:        The GeneXpert MRSA Assay (FDA approved for NASAL specimens only), is one component of a comprehensive MRSA colonization surveillance program. It is not intended to diagnose MRSA infection nor to guide or monitor treatment for MRSA infections. RESULT CALLED TO, READ BACK BY AND VERIFIED WITH: D EVERETTE RN 08/31/17 0229 JDW Performed at [facility], [place] [address], [place], [place] [address]   C difficile quick scan w PCR reflex     Status: Abnormal   Collection Time: 08/31/17  1:01 AM  Result Value Ref Range Status   C Diff antigen POSITIVE (A) NEGATIVE Final   C Diff toxin NEGATIVE NEGATIVE Final   C Diff interpretation Results are indeterminate. See PCR results.  Final    Comment: Performed at [facility], [place] [address] [place], [place] [address]  C. Diff by PCR, Reflexed     Status: None   Collection Time: 08/31/17  1:01 AM  Result Value Ref Range Status   Toxigenic C. Difficile by PCR NEGATIVE NEGATIVE Final    Comment: Patient is colonized with non toxigenic C. difficile. May not need treatment unless significant symptoms are present. Performed at [facility], [place] [address], [place], [place] [address]          Radiology Studies: No results found.      Scheduled Meds: . allopurinol  300 mg Oral Daily  . aspirin EC  81 mg Oral QHS  . Chlorhexidine Gluconate Cloth  6 each  Topical Q0600  . cholecalciferol  2,000 Units Oral Daily  . cycloSPORINE  1 drop Both Eyes BID  . enoxaparin (LOVENOX) injection  40 mg Subcutaneous Q24H  . fluticasone  1 spray Each Nare Daily  . folic acid  1 mg Oral Daily  . gabapentin  800 mg Oral TID  . insulin aspart  0-20 Units Subcutaneous TID WC  . insulin aspart  4 Units Subcutaneous TID WC  . insulin glargine  5 Units Subcutaneous Daily  . ipratropium  1 spray Each Nare TID  . mouth rinse  15 mL Mouth Rinse BID  . metoprolol tartrate  75 mg Oral BID  . montelukast  10 mg Oral QHS  . mupirocin ointment  1 application Nasal BID  . oxybutynin  10 mg Oral Daily   Continuous Infusions: . ceFEPime (MAXIPIME) IV Stopped (09/01/17 1552)  . famotidine (PEPCID) IV Stopped (09/02/17 0949)  . lactated ringers 50 mL/hr at 09/02/17 0927     LOS: 3 days        Aline August, MD [facility] Pager [phone]  If 7PM-7AM, please contact night-coverage [email or web address] Password [email or web address] 09/02/2017, 1:48 PM

## 2017-09-02 NOTE — NC FL2 (Signed)
[place] MEDICAID FL2 LEVEL OF CARE SCREENING TOOL     IDENTIFICATION  Patient Name: Christy May Birthdate: 1951-05-14 Sex: female Admission Date (Current Location): 08/30/2017  [facility] and [place] Number:  Herbalist and Address:  [facility], [place] [address] [place], [place] [address]      Provider Number: [id number]  Attending Physician Name and Address:  Aline August, MD  Relative Name and Phone Number:       Current Level of Care: Hospital Recommended Level of Care: [place] Prior Approval Number:    Date Approved/Denied:   PASRR Number: [id number]  Discharge Plan: SNF    Current Diagnoses: Patient Active Problem List   Diagnosis Date Noted  . Septic shock ([place]) 08/30/2017  . Acute renal failure (ARF) ([place]) 08/13/2017  . Pressure injury of skin 08/13/2017  . Sepsis due to gram-negative UTI ([facility]) 08/12/2017  . Upper airway cough syndrome 09/28/2016  . Acute bronchitis 10/29/2015  . Diabetes ([place]) 08/21/2015  . Chest pain 07/14/2015  . Dyspnea on exertion 07/05/2015  . Obstructive sleep apnea 07/05/2015  . Constipation 03/05/2015  . Allergic rhinitis 03/05/2015  . Hearing loss 03/05/2015  . Vocal cord dysfunction 03/05/2015  . Pain in joint, shoulder region 09/11/2014  . Weakness generalized 09/02/2014  . Acute esophagitis 05/08/2014  . Benign neoplasm of sigmoid colon 05/08/2014  . Change in bowel habits 04/17/2014  . Fecal incontinence 04/17/2014  . Urge incontinence 04/08/2014  . Cough 12/06/2013  . Chronic pain syndrome 08/24/2013  . Orthostasis 07/18/2013  . Acute kidney injury ([place]) 07/18/2013  . Bilateral carpal tunnel syndrome 02/14/2013  . Gout 01/04/2013  . Failure to thrive 06/28/2012  . Gait disorder 06/28/2012  . Lower abdominal pain 12/14/2011  . Preventative health care 08/23/2011  . Cervical radiculopathy 02/11/2011  . Low back pain 02/11/2011  . VITAMIN D DEFICIENCY  11/22/2009  . ANEMIA-NOS 11/22/2009  . DEGENERATIVE JOINT DISEASE, CERVICAL SPINE 11/22/2009  . Depression 09/16/2009  . POLYNEUROPATHY 09/16/2009  . Morbid obesity ([place]) 09/09/2009  . Essential hypertension 09/09/2009  . GERD 09/09/2009  . Sleep apnea 09/09/2009    Orientation RESPIRATION BLADDER Height & Weight     Self, Time, Situation, Place  Normal Incontinent, External catheter Weight: (!) 318 lb 12.6 oz (144.6 kg) Height:  5\' 4"  (162.6 cm)  BEHAVIORAL SYMPTOMS/MOOD NEUROLOGICAL BOWEL NUTRITION STATUS      Incontinent Diet(carb modified)  AMBULATORY STATUS COMMUNICATION OF NEEDS Skin   Extensive Assist Verbally PU Stage and Appropriate Care   PU Stage 2 Dressing: (located on buttocks- barrier film dressing)                   Personal Care Assistance Level of Assistance  Bathing, Dressing Bathing Assistance: Maximum assistance   Dressing Assistance: Maximum assistance     Functional Limitations Info             SPECIAL CARE FACTORS FREQUENCY  PT (By licensed PT), OT (By licensed OT)     PT Frequency: 5/wk OT Frequency: 5/wk            Contractures      Additional Factors Info  Code Status, Allergies, Insulin Sliding Scale Code Status Info: FULL Allergies Info: Ace Inhibitors, Penicillins, Adhesive Tape   Insulin Sliding Scale Info: 7/day       Current Medications (09/02/2017):  This is the current hospital active medication list Current Facility-Administered Medications  Medication Dose Route Frequency Provider Last  Rate Last Dose  . albuterol (PROVENTIL) (2.5 MG/3ML) 0.083% nebulizer solution 2.5 mg  2.5 mg Nebulization QID PRN Tarry Kos, MD      . allopurinol (ZYLOPRIM) tablet 300 mg  300 mg Oral Daily Tarry Kos, MD   300 mg at 09/02/17 4742  . aspirin EC tablet 81 mg  81 mg Oral QHS Tarry Kos, MD   81 mg at 09/01/17 2105  . ceFEPIme (MAXIPIME) 2 g in sodium chloride 0.9 % 100 mL IVPB  2 g Intravenous Q8H  Robertson, [place], RPH      . Chlorhexidine Gluconate Cloth 2 % PADS 6 each  6 each Topical Q0600 Tarry Kos, MD   6 each at 09/02/17 [phone]  . cholecalciferol (VITAMIN D) tablet 2,000 Units  2,000 Units Oral Daily Tarry Kos, MD   2,000 Units at 09/02/17 [phone]  . cycloSPORINE (RESTASIS) 0.05 % ophthalmic emulsion 1 drop  1 drop Both Eyes BID Tarry Kos, MD   1 drop at 09/02/17 0920  . enoxaparin (LOVENOX) injection 40 mg  40 mg Subcutaneous Q24H Tarry Kos, MD   40 mg at 09/01/17 2106  . famotidine (PEPCID) IVPB 20 mg premix  20 mg Intravenous Q12H Tarry Kos, MD   Stopped at 09/02/17 [phone]  . fluticasone (FLONASE) 50 MCG/ACT nasal spray 1 spray  1 spray Each Nare Daily Tarry Kos, MD   1 spray at 29/51/88 4166  . folic acid (FOLVITE) tablet 1 mg  1 mg Oral Daily Tarry Kos, MD   1 mg at 09/02/17 0630  . gabapentin (NEURONTIN) capsule 800 mg  800 mg Oral TID Tarry Kos, MD   800 mg at 09/02/17 1601  . HYDROcodone-acetaminophen (NORCO) 7.5-325 MG per tablet 1 tablet  1 tablet Oral Q8H PRN Tarry Kos, MD   1 tablet at 09/02/17 0919  . insulin aspart (novoLOG) injection 0-20 Units  0-20 Units Subcutaneous TID WC Ollis, Brandi L, NP   7 Units at 09/02/17 1203  . insulin aspart (novoLOG) injection 4 Units  4 Units Subcutaneous TID WC Ollis, Brandi L, NP   4 Units at 09/02/17 1203  . insulin glargine (LANTUS) injection 5 Units  5 Units Subcutaneous Daily Ollis, Brandi L, NP   5 Units at 09/02/17 0920  . ipratropium (ATROVENT) 0.06 % nasal spray 1 spray  1 spray Each Nare TID Tarry Kos, MD   1 spray at 09/02/17 0920  . MEDLINE mouth rinse  15 mL Mouth Rinse BID Tarry Kos, MD   15 mL at 09/01/17 2107  . metoprolol tartrate (LOPRESSOR) tablet 75 mg  75 mg Oral BID Tarry Kos, MD   75 mg at 09/02/17 0932  . montelukast (SINGULAIR) tablet 10 mg  10 mg Oral QHS Tarry Kos, MD   10  mg at 09/01/17 2105  . mupirocin ointment (BACTROBAN) 2 % 1 application  1 application Nasal BID Tarry Kos, MD   1 application at 35/57/32 1203  . oxybutynin (DITROPAN-XL) 24 hr tablet 10 mg  10 mg Oral Daily Ollis, Brandi L, NP   10 mg at 09/02/17 2025  . sodium chloride flush (NS) 0.9 % injection 10-40 mL  10-40 mL Intracatheter PRN Charlesetta Shanks, MD         Discharge Medications: Please see discharge summary for a list of discharge medications.  Relevant Imaging Results:  Relevant Lab Results:   Additional Information SSN: [id number]  Jorge Ny, LCSW

## 2017-09-03 DIAGNOSIS — M6281 Muscle weakness (generalized): Secondary | ICD-10-CM | POA: Diagnosis not present

## 2017-09-03 DIAGNOSIS — R6521 Severe sepsis with septic shock: Secondary | ICD-10-CM | POA: Diagnosis not present

## 2017-09-03 DIAGNOSIS — R2681 Unsteadiness on feet: Secondary | ICD-10-CM | POA: Diagnosis not present

## 2017-09-03 DIAGNOSIS — M545 Low back pain: Secondary | ICD-10-CM | POA: Diagnosis not present

## 2017-09-03 DIAGNOSIS — E1165 Type 2 diabetes mellitus with hyperglycemia: Secondary | ICD-10-CM | POA: Diagnosis not present

## 2017-09-03 DIAGNOSIS — N390 Urinary tract infection, site not specified: Secondary | ICD-10-CM | POA: Diagnosis not present

## 2017-09-03 DIAGNOSIS — Z6841 Body Mass Index (BMI) 40.0 and over, adult: Secondary | ICD-10-CM | POA: Diagnosis not present

## 2017-09-03 DIAGNOSIS — B351 Tinea unguium: Secondary | ICD-10-CM | POA: Diagnosis not present

## 2017-09-03 DIAGNOSIS — M25511 Pain in right shoulder: Secondary | ICD-10-CM | POA: Diagnosis not present

## 2017-09-03 DIAGNOSIS — Z794 Long term (current) use of insulin: Secondary | ICD-10-CM | POA: Diagnosis not present

## 2017-09-03 DIAGNOSIS — N183 Chronic kidney disease, stage 3 (moderate): Secondary | ICD-10-CM

## 2017-09-03 DIAGNOSIS — R52 Pain, unspecified: Secondary | ICD-10-CM | POA: Diagnosis not present

## 2017-09-03 DIAGNOSIS — E669 Obesity, unspecified: Secondary | ICD-10-CM | POA: Diagnosis not present

## 2017-09-03 DIAGNOSIS — Z7984 Long term (current) use of oral hypoglycemic drugs: Secondary | ICD-10-CM | POA: Diagnosis not present

## 2017-09-03 DIAGNOSIS — R488 Other symbolic dysfunctions: Secondary | ICD-10-CM | POA: Diagnosis not present

## 2017-09-03 DIAGNOSIS — M79651 Pain in right thigh: Secondary | ICD-10-CM | POA: Diagnosis not present

## 2017-09-03 DIAGNOSIS — E118 Type 2 diabetes mellitus with unspecified complications: Secondary | ICD-10-CM | POA: Diagnosis not present

## 2017-09-03 DIAGNOSIS — E1140 Type 2 diabetes mellitus with diabetic neuropathy, unspecified: Secondary | ICD-10-CM | POA: Diagnosis not present

## 2017-09-03 DIAGNOSIS — R239 Unspecified skin changes: Secondary | ICD-10-CM | POA: Diagnosis not present

## 2017-09-03 DIAGNOSIS — E1151 Type 2 diabetes mellitus with diabetic peripheral angiopathy without gangrene: Secondary | ICD-10-CM | POA: Diagnosis not present

## 2017-09-03 DIAGNOSIS — E1122 Type 2 diabetes mellitus with diabetic chronic kidney disease: Secondary | ICD-10-CM | POA: Diagnosis not present

## 2017-09-03 DIAGNOSIS — R278 Other lack of coordination: Secondary | ICD-10-CM | POA: Diagnosis not present

## 2017-09-03 DIAGNOSIS — Z8659 Personal history of other mental and behavioral disorders: Secondary | ICD-10-CM | POA: Diagnosis not present

## 2017-09-03 DIAGNOSIS — R2689 Other abnormalities of gait and mobility: Secondary | ICD-10-CM | POA: Diagnosis not present

## 2017-09-03 DIAGNOSIS — N179 Acute kidney failure, unspecified: Secondary | ICD-10-CM | POA: Diagnosis not present

## 2017-09-03 DIAGNOSIS — A419 Sepsis, unspecified organism: Secondary | ICD-10-CM | POA: Diagnosis not present

## 2017-09-03 DIAGNOSIS — Z9181 History of falling: Secondary | ICD-10-CM | POA: Diagnosis not present

## 2017-09-03 LAB — CBC WITH DIFFERENTIAL/PLATELET
BASOS ABS: 0 10*3/uL (ref 0.0–0.1)
BASOS PCT: 1 %
EOS PCT: 3 %
Eosinophils Absolute: 0.2 10*3/uL (ref 0.0–0.7)
HEMATOCRIT: 36.6 % (ref 36.0–46.0)
Hemoglobin: 11.3 g/dL — ABNORMAL LOW (ref 12.0–15.0)
Lymphocytes Relative: 40 %
Lymphs Abs: 2.1 10*3/uL (ref 0.7–4.0)
MCH: 28.4 pg (ref 26.0–34.0)
MCHC: 30.9 g/dL (ref 30.0–36.0)
MCV: 92 fL (ref 78.0–100.0)
MONO ABS: 0.5 10*3/uL (ref 0.1–1.0)
MONOS PCT: 9 %
NEUTROS ABS: 2.6 10*3/uL (ref 1.7–7.7)
Neutrophils Relative %: 47 %
PLATELETS: 174 10*3/uL (ref 150–400)
RBC: 3.98 MIL/uL (ref 3.87–5.11)
RDW: 14.5 % (ref 11.5–15.5)
WBC: 5.4 10*3/uL (ref 4.0–10.5)

## 2017-09-03 LAB — BASIC METABOLIC PANEL
Anion gap: 10 (ref 5–15)
BUN: 13 mg/dL (ref 6–20)
CALCIUM: 8.8 mg/dL — AB (ref 8.9–10.3)
CO2: 25 mmol/L (ref 22–32)
CREATININE: 0.95 mg/dL (ref 0.44–1.00)
Chloride: 102 mmol/L (ref 101–111)
GLUCOSE: 199 mg/dL — AB (ref 65–99)
Potassium: 4.4 mmol/L (ref 3.5–5.1)
Sodium: 137 mmol/L (ref 135–145)

## 2017-09-03 LAB — GLUCOSE, CAPILLARY
GLUCOSE-CAPILLARY: 187 mg/dL — AB (ref 65–99)
GLUCOSE-CAPILLARY: 251 mg/dL — AB (ref 65–99)
Glucose-Capillary: 264 mg/dL — ABNORMAL HIGH (ref 65–99)

## 2017-09-03 LAB — MAGNESIUM: Magnesium: 1.4 mg/dL — ABNORMAL LOW (ref 1.7–2.4)

## 2017-09-03 MED ORDER — PREDNISONE 10 MG PO TABS: 20.0000 mg | ORAL_TABLET | Freq: Every day | ORAL | Status: DC

## 2017-09-03 MED ORDER — MAGNESIUM SULFATE 2 GM/50ML IV SOLN
2.0000 g | Freq: Once | INTRAVENOUS | Status: AC
  Administered 2017-09-03: 2 g via INTRAVENOUS
  Filled 2017-09-03: qty 50

## 2017-09-03 MED ORDER — FOLIC ACID 1 MG PO TABS: ORAL_TABLET | ORAL | Status: DC

## 2017-09-03 MED ORDER — FAMOTIDINE 20 MG PO TABS
20.0000 mg | ORAL_TABLET | Freq: Two times a day (BID) | ORAL | Status: DC
  Administered 2017-09-03: 20 mg via ORAL
  Filled 2017-09-03: qty 1

## 2017-09-03 MED ORDER — CEPHALEXIN 500 MG PO CAPS: 500.0000 mg | ORAL_CAPSULE | Freq: Three times a day (TID) | ORAL | 0 refills | Status: AC

## 2017-09-03 NOTE — Progress Notes (Signed)
Pt picked up by PTAR to be transported off to disposition. Pt transported off unit via stretcher with belongings to the side. P. Amo Nahun Kronberg RN 

## 2017-09-03 NOTE — Progress Notes (Signed)
Discharge Note:    Patient to transfer to [facility].  She is alert and oriented X 4 and in no distress.  Report called to Hali Marry, Editor, commissioning of [facility]. Patient care assumed by [place],RN until PTAR arrives to transport patient.

## 2017-09-03 NOTE — Care Management Note (Signed)
Case Management Note  Patient Details  Name: Christy May MRN: [id number] Date of Birth: May 18, 1951  Subjective/Objective:                    Action/Plan: Pt discharging back to [facility] today. CM signing off.  Expected Discharge Date:  09/03/17               Expected Discharge Plan:  Skilled Nursing Facility  In-House Referral:  Clinical Social Work  Discharge planning Services     Post Acute Care Choice:    Choice offered to:     DME Arranged:    DME Agency:     HH Arranged:    [place] Agency:     Status of Service:  Completed, signed off  If discussed at [employer], dates discussed:    Additional Comments:  Pollie Friar, RN 09/03/2017, 2:11 PM

## 2017-09-03 NOTE — Progress Notes (Signed)
Pt flexiseal removed per protocol. Will continue to monitor pt till pt pick up by PTAR. Delia Heady RN

## 2017-09-03 NOTE — Progress Notes (Signed)
Patient will discharge to [facility] Anticipated discharge date: 3/15 Family notified: pt to notify Transportation by PTAR- called at 1:10pm Report #: [phone]  [place] signing off.  Jorge Ny, LCSW Clinical Social Worker [phone]

## 2017-09-03 NOTE — Consult Note (Signed)
   [facility] CM Inpatient Consult   09/03/2017  Christy May 1951/06/01 [id number]   [facility] Care Management follow up.  Chart reviewed. Noted Christy May will discharge back to [facility] today 09/03/17.  Will make [facility] LCSW aware.  Marthenia Rolling, MSN-Ed, RN,BSN [facility] Liaison [phone]

## 2017-09-03 NOTE — Discharge Summary (Signed)
Physician Discharge Summary  TYNE BANTA HEN:[id number] DOB: [birth year] DOA: 08/30/2017  PCP: Lucianne Lei, MD  Admit date: 08/30/2017 Discharge date: 09/03/2017  Admitted From: SNF Disposition:  SNF  Recommendations for Outpatient Follow-up:  1. Follow up with provider at nursing home at earliest convenience with repeat CBC/BMP in a week   Home Health: No Equipment/Devices: None  Discharge Condition: Stable CODE STATUS: Full Diet recommendation: Heart Healthy / Carb Modified    Brief/Interim Summary: 67 year old female with history of hypertension, diabetes mellitus, morbid obesity, depression and recent admission and discharge to nursing home on 08/17/2017 for sepsis secondary to E. coli UTI treated with intravenous and subsequent oral antibiotics presented on 08/30/2017 with altered mental status.  She was found to have leukocytosis with elevated lactate with CT abdomen showing fluid levels throughout the nondilated small and large bowel concerning for infectious enterocolitis.  She was hypotensive requiring vasopressors and was admitted to ICU.  C. difficile antigen was positive but toxin was negative.  She had 1 out of 2 blood cultures growing coagulase negative Staphylococcus.  Patient was weaned off vasopressor support.  She is empirically on intravenous cefepime currently.  She was transferred out of ICU and hospitalist service has assumed her care since 09/02/2017.  She has remained stable and will be discharged to nursing home.    Discharge Diagnoses:  Active Problems:   Lower abdominal pain   Septic shock (HCC)   Septic Shock- suspected urinary vs GI source; resolved -Currently on empiric cefepime, day #5.  Patient received 1 dose of vancomycin on admission.  Cultures negative so far except for coagulase negative staph which probably is a contaminant.  C. difficile antigen was positive but toxin was negative, hence was not treated for that. -Discharged on oral Keflex for  3 more days -Currently hemodynamically improved.  AKI- in setting of shock, volume depletion  -Resolved.  Treated with intravenous fluids.  -Outpatient follow-up  Anemia -questionable cause.  Hemoglobin stable.    Outpatient follow-up  DM type II with Hyperglycemia  -Continue outpatient regimen.   OSA  -Continue CPAP  Hypertension -Home antihypertensives remained on hold because of initial hypotension.   -Blood pressure currently on the higher side.  Resume outpatient regimen  Chronic Pain  Continue neurontin  Hold home flexeril, norco, tramadol for now because of initial altered mental status.  Outpatient follow-up and these medications can be probably gradually reintroduced  GERD   history of hiatal Hernia  Continue Protonix and ranitidine  Deconditioning  Morbid Obesity  Continue physical therapy in the nursing home Out patient follow-up      Discharge Instructions  Discharge Instructions    Call MD for:  difficulty breathing, headache or visual disturbances   Complete by:  As directed    Call MD for:  extreme fatigue   Complete by:  As directed    Call MD for:  hives   Complete by:  As directed    Call MD for:  persistant dizziness or light-headedness   Complete by:  As directed    Call MD for:  persistant nausea and vomiting   Complete by:  As directed    Call MD for:  severe uncontrolled pain   Complete by:  As directed    Call MD for:  temperature >100.4   Complete by:  As directed    Diet - low sodium heart healthy   Complete by:  As directed    Diet Carb Modified   Complete by:  As  directed    Increase activity slowly   Complete by:  As directed      Allergies as of 09/03/2017      Reactions   Ace Inhibitors Anaphylaxis, Cough   Penicillins Hives, Other (See Comments)   Tolerated ceftriaxone in 2019 Has patient had a PCN reaction causing immediate rash, facial/tongue/throat swelling, SOB or lightheadedness with hypotension:  Yes Has patient had a PCN reaction causing severe rash involving mucus membranes or skin necrosis:  No Has patient had a PCN reaction that required hospitalization: No Has patient had a PCN reaction occurring within the last 10 years: No If all of the above answers are "NO", then may proceed with Cephalosporin use.   Adhesive [tape] Hives, Rash      Medication List    STOP taking these medications   cyclobenzaprine 10 MG tablet Commonly known as:  FLEXERIL   HYDROcodone-acetaminophen 7.5-325 MG tablet Commonly known as:  NORCO   traMADol 50 MG tablet Commonly known as:  ULTRAM     TAKE these medications   albuterol (2.5 MG/3ML) 0.083% nebulizer solution Commonly known as:  PROVENTIL Take 2.5 mg by nebulization 4 (four) times daily as needed for wheezing or shortness of breath.   allopurinol 300 MG tablet Commonly known as:  ZYLOPRIM Take 300 mg by mouth daily.   aspirin EC 81 MG tablet Take 81 mg by mouth at bedtime.   cephALEXin 500 MG capsule Commonly known as:  KEFLEX Take 1 capsule (500 mg total) by mouth 3 (three) times daily for 3 days.   cycloSPORINE 0.05 % ophthalmic emulsion Commonly known as:  RESTASIS Place 1 drop into both eyes 2 (two) times daily.   fluticasone 50 MCG/ACT nasal spray Commonly known as:  FLONASE SHAKE LIQUID AND USE 2 SPRAYS IN EACH NOSTRIL DAILY   folic acid 1 MG tablet Commonly known as:  FOLVITE Take 1 mg by mouth once a day   gabapentin 800 MG tablet Commonly known as:  NEURONTIN Take 1 tablet (800 mg total) by mouth 3 (three) times daily.   glucose blood test strip Commonly known as:  ONETOUCH VERIO Use as instructed to test twice daily DX E11.22   insulin NPH Human 100 UNIT/ML injection Commonly known as:  NOVOLIN N RELION Inject 1.2 mLs (120 Units total) into the skin every morning.   INSULIN SYRINGE 1CC/31GX5/16" 31G X 5/16" 1 ML Misc USE TO INJECT INSULIN TWICE DAILY   ipratropium 0.03 % nasal spray Commonly known  as:  ATROVENT USE 2 SPRAYS IN EACH NOSTRIL THREE TIMES DAILY AS NEEDED FOR RHINITIS   irbesartan 300 MG tablet Commonly known as:  AVAPRO Take 300 mg by mouth daily.   LIMENCIN 4-4 % Ptch Generic drug:  Lidocaine-Menthol Apply 1 patch topically See admin instructions. Apply 1 patch daily to the right shoulder/remove each evening   loratadine 10 MG tablet Commonly known as:  CLARITIN Take 10 mg by mouth daily.   metoprolol tartrate 50 MG tablet Commonly known as:  LOPRESSOR Take 75 mg by mouth 2 (two) times daily.   montelukast 10 MG tablet Commonly known as:  SINGULAIR TAKE 1 TABLET(10 MG) BY MOUTH DAILY   multivitamin with minerals tablet Take 1 tablet by mouth daily.   NOVOLOG 100 UNIT/ML injection Generic drug:  insulin aspart Inject 0-12 Units into the skin See admin instructions. Inject 0-12 units into the skin before meals & at bedtime, per sliding scale: BGL <70 = call MD; 70-200 = 0 units; 201-250 =  2 units; 251-300 = 4 units; 301-350 = 6 units; 351-400 = 8 units; 401-450 = 10 units; >450 = 12 units   ONE TOUCH LANCETS Misc Use to check sugars twice daily DX E11.22   oxybutynin 10 MG 24 hr tablet Commonly known as:  DITROPAN-XL Take 1 tablet (10 mg total) by mouth daily.   pantoprazole 40 MG tablet Commonly known as:  PROTONIX TAKE 1 TABLET(40 MG) BY MOUTH TWICE DAILY   predniSONE 10 MG tablet Commonly known as:  DELTASONE Take 2 tablets (20 mg total) by mouth daily with breakfast.   ranitidine 300 MG tablet Commonly known as:  ZANTAC TAKE 1 TABLET(300 MG) BY MOUTH AT BEDTIME What changed:  See the new instructions.   sodium chloride 0.65 % Soln nasal spray Commonly known as:  OCEAN Place 2 sprays into both nostrils 2 (two) times daily.   VITAMIN B12-FOLIC ACID PO Take 1 tablet by mouth daily.   Vitamin D-3 1000 units Caps Take 2,000 Units by mouth daily.      Follow-up Information    Lucianne Lei, MD. Schedule an appointment as soon as possible  for a visit in 1 week(s).   Specialty:  Family Medicine Why:  with repeat CBC/BMP Contact information: [address] [place] [address] [phone]          Allergies  Allergen Reactions  . Ace Inhibitors Anaphylaxis and Cough  . Penicillins Hives and Other (See Comments)    Tolerated ceftriaxone in 2019  Has patient had a PCN reaction causing immediate rash, facial/tongue/throat swelling, SOB or lightheadedness with hypotension: Yes Has patient had a PCN reaction causing severe rash involving mucus membranes or skin necrosis:  No Has patient had a PCN reaction that required hospitalization: No Has patient had a PCN reaction occurring within the last 10 years: No If all of the above answers are "NO", then may proceed with Cephalosporin use.  . Adhesive [Tape] Hives and Rash    Consultations:  PCCM   Procedures/Studies: Ct Abdomen Pelvis Wo Contrast  Result Date: 08/30/2017 CLINICAL DATA:  Sudden onset generalized abdominal pain yesterday. Lethargy. EXAM: CT ABDOMEN AND PELVIS WITHOUT CONTRAST TECHNIQUE: Multidetector CT imaging of the abdomen and pelvis was performed following the standard protocol without IV contrast. COMPARISON:  02/28/2015 CT abdomen/pelvis. FINDINGS: Image quality is significantly degraded by patient body habitus and by streak artifact from the patient's upper extremities. Lower chest: Hypoventilatory changes at both lung bases. Tip of a superior approach central venous catheter is seen in the right atrium. Hepatobiliary: Normal liver size. No liver mass. Cholecystectomy. Bile ducts are stable and within normal post cholecystectomy limits. CBD diameter 7 mm. Pancreas: Normal, with no mass or duct dilation. Spleen: Normal size. No mass. Adrenals/Urinary Tract: Normal adrenals. No renal stones. No hydronephrosis. No contour deforming renal masses. Normal bladder. Stomach/Bowel: Normal non-distended stomach. No disproportionately dilated small bowel loops  or focal small bowel caliber transition. Fluid levels throughout the nondilated small bowel loops. Appendectomy. Fluid levels throughout the large, with no large bowel wall thickening or acute pericolonic fat stranding. Vascular/Lymphatic: Atherosclerotic nonaneurysmal abdominal aorta. No pathologically enlarged lymph nodes in the abdomen or pelvis. Reproductive: Status post hysterectomy, with no abnormal findings at the vaginal cuff. No adnexal mass. Other: No pneumoperitoneum, ascites or focal fluid collection. Musculoskeletal: No aggressive appearing focal osseous lesions. Marked thoracolumbar spondylosis. IMPRESSION: 1. Fluid levels throughout the nondilated small and large bowel, indicative of a nonspecific malabsorptive state, such as can be due  to an infectious enterocolitis. No evidence of bowel obstruction. 2.  Aortic Atherosclerosis (ICD10-I70.0). Electronically Signed   By: Ilona Sorrel M.D.   On: 08/30/2017 17:31   Ct Head Wo Contrast  Result Date: 08/30/2017 CLINICAL DATA:  Headaches EXAM: CT HEAD WITHOUT CONTRAST TECHNIQUE: Contiguous axial images were obtained from the base of the skull through the vertex without intravenous contrast. COMPARISON:  07/02/2017 FINDINGS: Brain: Mild atrophic changes are noted. No findings to suggest acute hemorrhage, acute infarction or space-occupying mass lesion are noted. Vascular: No hyperdense vessel or unexpected calcification. Skull: Normal. Negative for fracture or focal lesion. Sinuses/Orbits: No acute finding. Other: None. IMPRESSION: Mild atrophic changes without acute abnormality. Electronically Signed   By: Inez Catalina M.D.   On: 08/30/2017 17:04   Dg Chest Portable 1 View  Result Date: 08/30/2017 CLINICAL DATA:  Abdominal pain, central line placement. EXAM: PORTABLE CHEST 1 VIEW COMPARISON:  Chest radiograph August 12, 2017 FINDINGS: Stable cardiomegaly. Calcified aortic knob. Mild bronchitic changes. Blunting of the costophrenic angles. New  RIGHT internal jugular central venous catheter with distal tip projecting RIGHT atrium. No pneumothorax. ACDF. IMPRESSION: RIGHT internal jugular central venous catheter distal tip projecting in RIGHT atrium, recommend 5 cm retraction. Stable cardiomegaly and bronchitic changes. Small suspected pleural effusions. Aortic Atherosclerosis (ICD10-I70.0). Electronically Signed   By: Elon Alas M.D.   On: 08/30/2017 15:59   Dg Chest Port 1 View  Result Date: 08/12/2017 CLINICAL DATA:  Chronic back pain. EXAM: PORTABLE CHEST 1 VIEW COMPARISON:  09/28/2016 FINDINGS: 1829 hours. Low volumes. Cardiopericardial silhouette is at upper limits of normal for size. Basilar atelectasis without pulmonary edema or substantial pleural effusion. The visualized bony structures of the thorax are intact. Telemetry leads overlie the chest. IMPRESSION: No active disease. Electronically Signed   By: Misty Stanley M.D.   On: 08/12/2017 19:16      Subjective: Patient seen and examined at bedside.  She denies overnight fever, nausea or vomiting.  Discharge Exam: Vitals:   09/03/17 0441 09/03/17 0814  BP: 131/80 (!) 156/70  Pulse: 97 94  Resp: 18 18  Temp: 98.1 F (36.7 C) 98.3 F (36.8 C)  SpO2: 97% 98%   Vitals:   09/02/17 2027 09/03/17 0007 09/03/17 0441 09/03/17 0814  BP: 126/68 (!) 144/72 131/80 (!) 156/70  Pulse: 94 99 97 94  Resp: 18 18 18 18   Temp: 98.4 F (36.9 C) 98.4 F (36.9 C) 98.1 F (36.7 C) 98.3 F (36.8 C)  TempSrc: Oral Axillary Axillary Axillary  SpO2: 98% 98% 97% 98%  Weight:   (!) 142.3 kg (313 lb 11.4 oz) (!) 144.2 kg (317 lb 14.5 oz)  Height:        General: Pt is alert, awake, not in acute distress Cardiovascular: Rate controlled, S1/S2 + Respiratory: Lateral decreased breath sounds at bases Abdominal: Soft, obese, NT, ND, bowel sounds + Extremities: Trace edema, no cyanosis    The results of significant diagnostics from this hospitalization (including imaging,  microbiology, ancillary and laboratory) are listed below for reference.     Microbiology: Recent Results (from the past 240 hour(s))  Culture, blood (Routine x 2)     Status: None (Preliminary result)   Collection Time: 08/30/17  1:54 PM  Result Value Ref Range Status   Specimen Description BLOOD RIGHT ANTECUBITAL  Final   Special Requests Blood Culture adequate volume IN PEDIATRIC BOTTLE  Final   Culture   Final    NO GROWTH 3 DAYS Performed at [facility], [place] [address] [place], [place] [address]    Report Status PENDING  Incomplete  Culture, blood (Routine x 2)     Status: Abnormal   Collection Time: 08/30/17  2:53 PM  Result Value Ref Range Status   Specimen Description BLOOD CENTRAL LINE  Final   Special Requests   Final    BOTTLES DRAWN AEROBIC AND ANAEROBIC Blood Culture adequate volume   Culture  Setup Time   Final    GRAM POSITIVE COCCI IN CLUSTERS IN BOTH AEROBIC AND ANAEROBIC BOTTLES CRITICAL RESULT CALLED TO, READ BACK BY AND VERIFIED WITH: PHARMD T DANG [id number] MLM    Culture (A)  Final    STAPHYLOCOCCUS SPECIES (COAGULASE NEGATIVE) THE SIGNIFICANCE OF ISOLATING THIS ORGANISM FROM A SINGLE SET OF BLOOD CULTURES WHEN MULTIPLE SETS ARE DRAWN IS UNCERTAIN. PLEASE NOTIFY THE MICROBIOLOGY DEPARTMENT WITHIN ONE WEEK IF SPECIATION AND SENSITIVITIES ARE REQUIRED. Performed at [facility], [place] [address] [place], [place] [address]    Report Status 09/01/2017 FINAL  Final  Blood Culture ID Panel (Reflexed)     Status: Abnormal   Collection Time: 08/30/17  2:53 PM  Result Value Ref Range Status   Enterococcus species NOT DETECTED NOT DETECTED Final   Listeria monocytogenes NOT DETECTED NOT DETECTED Final   Staphylococcus species DETECTED (A) NOT DETECTED Final    Comment: Methicillin (oxacillin) resistant coagulase negative staphylococcus. Possible blood culture contaminant (unless isolated from more than one blood culture draw or clinical case suggests  pathogenicity). No antibiotic treatment is indicated for blood  culture contaminants. CRITICAL RESULT CALLED TO, READ BACK BY AND VERIFIED WITH: PHARMD T DANG [id number] MLM    Staphylococcus aureus NOT DETECTED NOT DETECTED Final   Methicillin resistance DETECTED (A) NOT DETECTED Final    Comment: CRITICAL RESULT CALLED TO, READ BACK BY AND VERIFIED WITH: PHARMD T DANG [id number] MLM    Streptococcus species NOT DETECTED NOT DETECTED Final   Streptococcus agalactiae NOT DETECTED NOT DETECTED Final   Streptococcus pneumoniae NOT DETECTED NOT DETECTED Final   Streptococcus pyogenes NOT DETECTED NOT DETECTED Final   Acinetobacter baumannii NOT DETECTED NOT DETECTED Final   Enterobacteriaceae species NOT DETECTED NOT DETECTED Final   Enterobacter cloacae complex NOT DETECTED NOT DETECTED Final   Escherichia coli NOT DETECTED NOT DETECTED Final   Klebsiella oxytoca NOT DETECTED NOT DETECTED Final   Klebsiella pneumoniae NOT DETECTED NOT DETECTED Final   Proteus species NOT DETECTED NOT DETECTED Final   Serratia marcescens NOT DETECTED NOT DETECTED Final   Haemophilus influenzae NOT DETECTED NOT DETECTED Final   Neisseria meningitidis NOT DETECTED NOT DETECTED Final   Pseudomonas aeruginosa NOT DETECTED NOT DETECTED Final   Candida albicans NOT DETECTED NOT DETECTED Final   Candida glabrata NOT DETECTED NOT DETECTED Final   Candida krusei NOT DETECTED NOT DETECTED Final   Candida parapsilosis NOT DETECTED NOT DETECTED Final   Candida tropicalis NOT DETECTED NOT DETECTED Final    Comment: Performed at [facility], [place]. [address], [place], [place] [address]  MRSA PCR Screening     Status: Abnormal   Collection Time: 08/30/17  8:34 PM  Result Value Ref Range Status   MRSA by PCR POSITIVE (A) NEGATIVE Final    Comment:        The GeneXpert MRSA Assay (FDA approved for NASAL specimens only), is one component of a comprehensive MRSA colonization surveillance program. It is  not intended to diagnose MRSA infection nor  to guide or monitor treatment for MRSA infections. RESULT CALLED TO, READ BACK BY AND VERIFIED WITH: D EVERETTE RN 08/31/17 0229 JDW Performed at [facility], [place] [address], [place], [place] [address]   C difficile quick scan w PCR reflex     Status: Abnormal   Collection Time: 08/31/17  1:01 AM  Result Value Ref Range Status   C Diff antigen POSITIVE (A) NEGATIVE Final   C Diff toxin NEGATIVE NEGATIVE Final   C Diff interpretation Results are indeterminate. See PCR results.  Final    Comment: Performed at [facility], [place] [address], [place], [place] [address]  C. Diff by PCR, Reflexed     Status: None   Collection Time: 08/31/17  1:01 AM  Result Value Ref Range Status   Toxigenic C. Difficile by PCR NEGATIVE NEGATIVE Final    Comment: Patient is colonized with non toxigenic C. difficile. May not need treatment unless significant symptoms are present. Performed at [facility], [place] [address], [place], [place] [address]      Labs: BNP (last 3 results) No results for input(s): BNP in the last 8760 hours. Basic Metabolic Panel: Recent Labs  Lab 08/30/17 1354 08/30/17 1804 08/31/17 0425 09/02/17 0438 09/03/17 0425  NA 136  --  139 138 137  K 5.3*  --  4.6 3.9 4.4  CL 98*  --  107 103 102  CO2 22  --  25 26 25   GLUCOSE 238*  --  226* 165* 199*  BUN 39*  --  32* 14 13  CREATININE 2.11* 1.83* 1.28* 0.89 0.95  CALCIUM 9.2  --  8.3* 8.6* 8.8*  MG  --   --  1.6*  --  1.4*  PHOS  --   --  3.4  --   --    Liver Function Tests: Recent Labs  Lab 08/30/17 1354  AST 30  ALT 24  ALKPHOS 83  BILITOT 0.8  PROT 6.8  ALBUMIN 3.1*   No results for input(s): LIPASE, AMYLASE in the last 168 hours. No results for input(s): AMMONIA in the last 168 hours. CBC: Recent Labs  Lab 08/30/17 1354 08/30/17 1804 08/31/17 0425 09/01/17 0548 09/02/17 0438 09/03/17 0425  WBC 13.7* 16.1* 14.2* 6.1 5.7 5.4  NEUTROABS  12.6*  --  12.3* 3.6  --  2.6  HGB 14.8 12.4 11.7* 10.8* 11.1* 11.3*  HCT 46.4* 39.5 38.4 35.7* 36.2 36.6  MCV 91.3 93.8 93.7 93.7 92.6 92.0  PLT PLATELETS APPEAR ADEQUATE 210 198 160 162 174   Cardiac Enzymes: Recent Labs  Lab 08/30/17 1804  TROPONINI <0.03   BNP: Invalid input(s): POCBNP CBG: Recent Labs  Lab 09/02/17 1150 09/02/17 1712 09/02/17 2121 09/03/17 0625 09/03/17 1119  GLUCAP 204* 150* 201* 187* 264*   D-Dimer No results for input(s): DDIMER in the last 72 hours. Hgb A1c No results for input(s): HGBA1C in the last 72 hours. Lipid Profile No results for input(s): CHOL, HDL, LDLCALC, TRIG, CHOLHDL, LDLDIRECT in the last 72 hours. Thyroid function studies No results for input(s): TSH, T4TOTAL, T3FREE, THYROIDAB in the last 72 hours.  Invalid input(s): FREET3 Anemia work up No results for input(s): VITAMINB12, FOLATE, FERRITIN, TIBC, IRON, RETICCTPCT in the last 72 hours. Urinalysis    Component Value Date/Time   COLORURINE YELLOW 08/30/2017 1257   APPEARANCEUR CLEAR 08/30/2017 1257   LABSPEC 1.013 08/30/2017 1257   PHURINE 5.0 08/30/2017 1257   GLUCOSEU 50 (A) 08/30/2017 1257  GLUCOSEU NEGATIVE 12/14/2011 1707   HGBUR NEGATIVE 08/30/2017 1257   [place] 08/30/2017 1257   BILIRUBINUR n 11/16/2014 1515   [place] 08/30/2017 1257   PROTEINUR NEGATIVE 08/30/2017 1257   UROBILINOGEN 0.2 02/27/2015 2226   NITRITE NEGATIVE 08/30/2017 1257   LEUKOCYTESUR NEGATIVE 08/30/2017 1257   Sepsis Labs Invalid input(s): PROCALCITONIN,  WBC,  LACTICIDVEN Microbiology Recent Results (from the past 240 hour(s))  Culture, blood (Routine x 2)     Status: None (Preliminary result)   Collection Time: 08/30/17  1:54 PM  Result Value Ref Range Status   Specimen Description BLOOD RIGHT ANTECUBITAL  Final   Special Requests Blood Culture adequate volume IN PEDIATRIC BOTTLE  Final   Culture   Final    NO GROWTH 3 DAYS Performed at [facility], [place] [address] [place], [place] [address]    Report Status PENDING  Incomplete  Culture, blood (Routine x 2)     Status: Abnormal   Collection Time: 08/30/17  2:53 PM  Result Value Ref Range Status   Specimen Description BLOOD CENTRAL LINE  Final   Special Requests   Final    BOTTLES DRAWN AEROBIC AND ANAEROBIC Blood Culture adequate volume   Culture  Setup Time   Final    GRAM POSITIVE COCCI IN CLUSTERS IN BOTH AEROBIC AND ANAEROBIC BOTTLES CRITICAL RESULT CALLED TO, READ BACK BY AND VERIFIED WITH: PHARMD T DANG [id number] MLM    Culture (A)  Final    STAPHYLOCOCCUS SPECIES (COAGULASE NEGATIVE) THE SIGNIFICANCE OF ISOLATING THIS ORGANISM FROM A SINGLE SET OF BLOOD CULTURES WHEN MULTIPLE SETS ARE DRAWN IS UNCERTAIN. PLEASE NOTIFY THE MICROBIOLOGY DEPARTMENT WITHIN ONE WEEK IF SPECIATION AND SENSITIVITIES ARE REQUIRED. Performed at [facility], [place] [address], [place], [place] [address]    Report Status 09/01/2017 FINAL  Final  Blood Culture ID Panel (Reflexed)     Status: Abnormal   Collection Time: 08/30/17  2:53 PM  Result Value Ref Range Status   Enterococcus species NOT DETECTED NOT DETECTED Final   Listeria monocytogenes NOT DETECTED NOT DETECTED Final   Staphylococcus species DETECTED (A) NOT DETECTED Final    Comment: Methicillin (oxacillin) resistant coagulase negative staphylococcus. Possible blood culture contaminant (unless isolated from more than one blood culture draw or clinical case suggests pathogenicity). No antibiotic treatment is indicated for blood  culture contaminants. CRITICAL RESULT CALLED TO, READ BACK BY AND VERIFIED WITH: PHARMD T DANG [id number] MLM    Staphylococcus aureus NOT DETECTED NOT DETECTED Final   Methicillin resistance DETECTED (A) NOT DETECTED Final    Comment: CRITICAL RESULT CALLED TO, READ BACK BY AND VERIFIED WITH: PHARMD T DANG [id number] MLM    Streptococcus species NOT DETECTED NOT DETECTED Final   Streptococcus agalactiae  NOT DETECTED NOT DETECTED Final   Streptococcus pneumoniae NOT DETECTED NOT DETECTED Final   Streptococcus pyogenes NOT DETECTED NOT DETECTED Final   Acinetobacter baumannii NOT DETECTED NOT DETECTED Final   Enterobacteriaceae species NOT DETECTED NOT DETECTED Final   Enterobacter cloacae complex NOT DETECTED NOT DETECTED Final   Escherichia coli NOT DETECTED NOT DETECTED Final   Klebsiella oxytoca NOT DETECTED NOT DETECTED Final   Klebsiella pneumoniae NOT DETECTED NOT DETECTED Final   Proteus species NOT DETECTED NOT DETECTED Final   Serratia marcescens NOT DETECTED NOT DETECTED Final   Haemophilus influenzae NOT DETECTED NOT DETECTED Final   Neisseria meningitidis NOT DETECTED NOT DETECTED Final   Pseudomonas aeruginosa NOT DETECTED  NOT DETECTED Final   Candida albicans NOT DETECTED NOT DETECTED Final   Candida glabrata NOT DETECTED NOT DETECTED Final   Candida krusei NOT DETECTED NOT DETECTED Final   Candida parapsilosis NOT DETECTED NOT DETECTED Final   Candida tropicalis NOT DETECTED NOT DETECTED Final    Comment: Performed at [facility], [place] [address] [place], [place] [address]  MRSA PCR Screening     Status: Abnormal   Collection Time: 08/30/17  8:34 PM  Result Value Ref Range Status   MRSA by PCR POSITIVE (A) NEGATIVE Final    Comment:        The GeneXpert MRSA Assay (FDA approved for NASAL specimens only), is one component of a comprehensive MRSA colonization surveillance program. It is not intended to diagnose MRSA infection nor to guide or monitor treatment for MRSA infections. RESULT CALLED TO, READ BACK BY AND VERIFIED WITH: D EVERETTE RN 08/31/17 0229 JDW Performed at [facility], [place] [address] [place], [place] [address]   C difficile quick scan w PCR reflex     Status: Abnormal   Collection Time: 08/31/17  1:01 AM  Result Value Ref Range Status   C Diff antigen POSITIVE (A) NEGATIVE Final   C Diff toxin NEGATIVE NEGATIVE Final   C Diff  interpretation Results are indeterminate. See PCR results.  Final    Comment: Performed at [facility], [place] [address], [place], [place] [address]  C. Diff by PCR, Reflexed     Status: None   Collection Time: 08/31/17  1:01 AM  Result Value Ref Range Status   Toxigenic C. Difficile by PCR NEGATIVE NEGATIVE Final    Comment: Patient is colonized with non toxigenic C. difficile. May not need treatment unless significant symptoms are present. Performed at [facility], [place] [address] [place], [place] [address]      Time coordinating discharge: 35 minutes  SIGNED:   Aline August, MD  [facility] 09/03/2017, 11:52 AM Pager: [phone]  If 7PM-7AM, please contact night-coverage [email or web address] Password [email or web address]

## 2017-09-04 LAB — CULTURE, BLOOD (ROUTINE X 2)
CULTURE: NO GROWTH
Special Requests: ADEQUATE

## 2017-09-06 DIAGNOSIS — Z8659 Personal history of other mental and behavioral disorders: Secondary | ICD-10-CM | POA: Diagnosis not present

## 2017-09-06 DIAGNOSIS — N390 Urinary tract infection, site not specified: Secondary | ICD-10-CM | POA: Diagnosis not present

## 2017-09-06 DIAGNOSIS — E669 Obesity, unspecified: Secondary | ICD-10-CM | POA: Diagnosis not present

## 2017-09-06 DIAGNOSIS — E1165 Type 2 diabetes mellitus with hyperglycemia: Secondary | ICD-10-CM | POA: Diagnosis not present

## 2017-09-07 DIAGNOSIS — Z9181 History of falling: Secondary | ICD-10-CM | POA: Diagnosis not present

## 2017-09-07 DIAGNOSIS — M6281 Muscle weakness (generalized): Secondary | ICD-10-CM | POA: Diagnosis not present

## 2017-09-07 DIAGNOSIS — M545 Low back pain: Secondary | ICD-10-CM | POA: Diagnosis not present

## 2017-09-07 DIAGNOSIS — M25511 Pain in right shoulder: Secondary | ICD-10-CM | POA: Diagnosis not present

## 2017-09-13 ENCOUNTER — Other Ambulatory Visit: Payer: Self-pay | Admitting: Licensed Clinical Social Worker

## 2017-09-13 DIAGNOSIS — M25511 Pain in right shoulder: Secondary | ICD-10-CM | POA: Diagnosis not present

## 2017-09-13 DIAGNOSIS — M545 Low back pain: Secondary | ICD-10-CM | POA: Diagnosis not present

## 2017-09-13 DIAGNOSIS — Z9181 History of falling: Secondary | ICD-10-CM | POA: Diagnosis not present

## 2017-09-13 DIAGNOSIS — M6281 Muscle weakness (generalized): Secondary | ICD-10-CM | POA: Diagnosis not present

## 2017-09-13 NOTE — Patient Outreach (Signed)
Assessment:  CSW spoke via phone with client. CSW verified client identity. CSW received verbal permission from client on 09/13/17 for CSW to speak with client about client needs. Client was recently hospitalized from 08/30/17 to 09/03/17.She discharged from the hospital on 09/03/17 and returned to [facility] to receive needed care. She has some support from her brother. CSW spoke with client about client care plan. CSW encouraged client to continue to participate in scheduled client  physical therapy sessions for client in next 30 days at nursing facility.  CSW and client spoke of client physical therapy participation.  Client has been using wheelchair to assist her with ambulation.  Client has been able to walk to and from the bathroom in her room. She is hoping to continue to receive needed physical therapy at facility. Client had been residing alone at her home in the community prior to receiving nursing home care. CSW has given client CSW card and encouraged Mirna to call CSW as needed to discuss social work needs of client.     Plan:  Client to continue to participate in scheduled client  physical therapy sessions for client in next 30 days at nursing facility.  CSW to call client in 4 weeks to assess client needs at that time.  [name].Rutha Melgoza MSW, LCSW Licensed Clinical Social Worker [facility] Care Management [phone]

## 2017-09-21 DIAGNOSIS — Z7984 Long term (current) use of oral hypoglycemic drugs: Secondary | ICD-10-CM | POA: Diagnosis not present

## 2017-09-21 DIAGNOSIS — E1140 Type 2 diabetes mellitus with diabetic neuropathy, unspecified: Secondary | ICD-10-CM | POA: Diagnosis not present

## 2017-09-21 DIAGNOSIS — B351 Tinea unguium: Secondary | ICD-10-CM | POA: Diagnosis not present

## 2017-09-21 DIAGNOSIS — E1151 Type 2 diabetes mellitus with diabetic peripheral angiopathy without gangrene: Secondary | ICD-10-CM | POA: Diagnosis not present

## 2017-09-27 DIAGNOSIS — M6281 Muscle weakness (generalized): Secondary | ICD-10-CM | POA: Diagnosis not present

## 2017-09-27 DIAGNOSIS — M25511 Pain in right shoulder: Secondary | ICD-10-CM | POA: Diagnosis not present

## 2017-09-27 DIAGNOSIS — M545 Low back pain: Secondary | ICD-10-CM | POA: Diagnosis not present

## 2017-09-27 DIAGNOSIS — M79651 Pain in right thigh: Secondary | ICD-10-CM | POA: Diagnosis not present

## 2017-09-27 DIAGNOSIS — Z9181 History of falling: Secondary | ICD-10-CM | POA: Diagnosis not present

## 2017-10-01 DIAGNOSIS — M6281 Muscle weakness (generalized): Secondary | ICD-10-CM | POA: Diagnosis not present

## 2017-10-01 DIAGNOSIS — M545 Low back pain: Secondary | ICD-10-CM | POA: Diagnosis not present

## 2017-10-01 DIAGNOSIS — M25511 Pain in right shoulder: Secondary | ICD-10-CM | POA: Diagnosis not present

## 2017-10-01 DIAGNOSIS — Z9181 History of falling: Secondary | ICD-10-CM | POA: Diagnosis not present

## 2017-10-01 DIAGNOSIS — M79651 Pain in right thigh: Secondary | ICD-10-CM | POA: Diagnosis not present

## 2017-10-06 DIAGNOSIS — M6281 Muscle weakness (generalized): Secondary | ICD-10-CM | POA: Diagnosis not present

## 2017-10-06 DIAGNOSIS — M545 Low back pain: Secondary | ICD-10-CM | POA: Diagnosis not present

## 2017-10-06 DIAGNOSIS — M25511 Pain in right shoulder: Secondary | ICD-10-CM | POA: Diagnosis not present

## 2017-10-06 DIAGNOSIS — R52 Pain, unspecified: Secondary | ICD-10-CM | POA: Diagnosis not present

## 2017-10-06 DIAGNOSIS — Z9181 History of falling: Secondary | ICD-10-CM | POA: Diagnosis not present

## 2017-10-06 DIAGNOSIS — M79651 Pain in right thigh: Secondary | ICD-10-CM | POA: Diagnosis not present

## 2017-10-08 DIAGNOSIS — R239 Unspecified skin changes: Secondary | ICD-10-CM | POA: Diagnosis not present

## 2017-10-13 ENCOUNTER — Telehealth: Payer: Self-pay | Admitting: Endocrinology

## 2017-10-13 NOTE — Telephone Encounter (Signed)
Patient was just recently released from rehab and would like to know how the doctor would like her to continue her medication since she in rehab she stated they had her taking her medications differently  Please advise

## 2017-10-14 ENCOUNTER — Other Ambulatory Visit: Payer: Self-pay

## 2017-10-14 DIAGNOSIS — E1165 Type 2 diabetes mellitus with hyperglycemia: Secondary | ICD-10-CM | POA: Diagnosis not present

## 2017-10-14 DIAGNOSIS — N390 Urinary tract infection, site not specified: Secondary | ICD-10-CM | POA: Diagnosis not present

## 2017-10-14 DIAGNOSIS — F17211 Nicotine dependence, cigarettes, in remission: Secondary | ICD-10-CM | POA: Diagnosis not present

## 2017-10-14 DIAGNOSIS — I10 Essential (primary) hypertension: Secondary | ICD-10-CM | POA: Diagnosis not present

## 2017-10-14 DIAGNOSIS — Z96653 Presence of artificial knee joint, bilateral: Secondary | ICD-10-CM | POA: Diagnosis not present

## 2017-10-14 DIAGNOSIS — M109 Gout, unspecified: Secondary | ICD-10-CM | POA: Diagnosis not present

## 2017-10-14 DIAGNOSIS — Z794 Long term (current) use of insulin: Secondary | ICD-10-CM | POA: Diagnosis not present

## 2017-10-14 DIAGNOSIS — Z792 Long term (current) use of antibiotics: Secondary | ICD-10-CM | POA: Diagnosis not present

## 2017-10-14 DIAGNOSIS — M797 Fibromyalgia: Secondary | ICD-10-CM | POA: Diagnosis not present

## 2017-10-14 DIAGNOSIS — Z7952 Long term (current) use of systemic steroids: Secondary | ICD-10-CM | POA: Diagnosis not present

## 2017-10-14 DIAGNOSIS — E669 Obesity, unspecified: Secondary | ICD-10-CM | POA: Diagnosis not present

## 2017-10-14 DIAGNOSIS — E1140 Type 2 diabetes mellitus with diabetic neuropathy, unspecified: Secondary | ICD-10-CM | POA: Diagnosis not present

## 2017-10-14 DIAGNOSIS — J42 Unspecified chronic bronchitis: Secondary | ICD-10-CM | POA: Diagnosis not present

## 2017-10-14 NOTE — Telephone Encounter (Signed)
I left message for patient to call & let us know how blood sugars have been running.

## 2017-10-14 NOTE — Telephone Encounter (Signed)
Ok,i just need to know how cbg's are in the morning and later in the day.

## 2017-10-14 NOTE — Telephone Encounter (Signed)
I called patient & they still have her on novolog sliding scale. However they changed her NPH to 70 units 2x daily. Patient wanted to know should she continue this? She stated that the 120 once daily is easier, but will do which you feel is better for her. Since patient is back home she is the one solely responsible for giving herself the daily shots.  Please advise?

## 2017-10-15 ENCOUNTER — Other Ambulatory Visit: Payer: Self-pay | Admitting: Licensed Clinical Social Worker

## 2017-10-15 ENCOUNTER — Other Ambulatory Visit: Payer: Self-pay

## 2017-10-15 NOTE — Patient Outreach (Signed)
Assessment:  CSW spoke via phone with client. CSW verified client identity. CSW received verbal permission from client for CSW to speak with client about client needs. Client said she recently discharged from [facility] facility. She had received physical therapy care and nursing support at that facility. Upon discharge from nursing facility, client returned to her home in [place], [place]. Client said she is receiving in home care from [facility]. She said a nurse visited her recently from home health agency. She said that home health agency was planning to provide home health physical therapy, home health occupational therapy and a home health aide to assist client as scheduled. CSW informed Lynora that CSW had sent order for [facility] RNCM to contact Jasilyn with Transition of Care calls to assess nursing needs of client. Kanda was agreeable to this plan. CSW informed Girtha that CSW had also sent order for CSW to be assigned to [place] since she is at home  in [place], [place] and no longer at [facility]. Raiven agreed to this plan.  CSW thanked Freight forwarder for phone call with CSW on 10/15/17. [place] was appreciative of phone call from Kyle on 10/15/17 .    Plan:  Channell to cooperate with care providers helping her in the home from [facility].  Case Management Assistant to assign CSW to client to address client's social work needs.  Assigned CSW to contact client in 2 weeks to discuss social work needs of client.   [name].Mabell Esguerra MSW, LCSW Licensed Clinical Social Worker [facility] Care Management [phone]

## 2017-10-15 NOTE — Patient Outreach (Signed)
[place] [facility]) Care Management  10/15/2017  Christy May 28-Apr-1951 [id number]   Referral received 10/15/17 for transition of carre-Recent discharge from [facility].  Assessment: 67 year old with recent history of falls, UTI. Client discharged from [facility] on history of falls, UTI, diabetes, HTN,  Client reports she is doing better, she reports she is active with [facility] for nursing, physical therapy, occupational therapy and bath aid. She reports she is ambulating with a walker now, but acknowledges how important it is to use the walker when she is ambulating.  She states she has a follow up appointment with her primary care, Dr. Criss Rosales on Monday and has called Dr. [name](endocrinologist) and will follow up with him when he gets back into town. She reports she will use [facility] to get to her appointments.   Client is without questions or concerns and is in good spirits stating she is happy to be home and happy to have [facility] team involved in her care.  RNCM provided contact number and encouraged to call as needed. RNCM also encouraged to call 24 hour nurse advice line as needed.  Plan: update assigned RNCM  Covering RNCM: Thea Silversmith, RN, MSN, [place] Coordinator Cell: [phone]

## 2017-10-15 NOTE — Patient Outreach (Signed)
Encounter created in error

## 2017-10-18 ENCOUNTER — Other Ambulatory Visit: Payer: Self-pay | Admitting: Licensed Clinical Social Worker

## 2017-10-18 DIAGNOSIS — M797 Fibromyalgia: Secondary | ICD-10-CM | POA: Diagnosis not present

## 2017-10-18 DIAGNOSIS — N390 Urinary tract infection, site not specified: Secondary | ICD-10-CM | POA: Diagnosis not present

## 2017-10-18 DIAGNOSIS — E1140 Type 2 diabetes mellitus with diabetic neuropathy, unspecified: Secondary | ICD-10-CM | POA: Diagnosis not present

## 2017-10-18 DIAGNOSIS — J42 Unspecified chronic bronchitis: Secondary | ICD-10-CM | POA: Diagnosis not present

## 2017-10-18 DIAGNOSIS — E1165 Type 2 diabetes mellitus with hyperglycemia: Secondary | ICD-10-CM | POA: Diagnosis not present

## 2017-10-18 DIAGNOSIS — M109 Gout, unspecified: Secondary | ICD-10-CM | POA: Diagnosis not present

## 2017-10-18 NOTE — Patient Outreach (Signed)
[facility]) Care Management  10/18/2017  Christy May 1950/07/31 [id number]  Assessment- [facility] CSW completed initial outreach call to patient after receiving new referral to follow up with after SNF discharge back home. Patient answered and provided HIPPA verifications successfully. THN CSW introduced self, reason for call and of THN social work services. Patient reports that she is doing well but is wondering when [facility] services will start. Patient reports having stable housing and transportation. Patient shares that she lives alone and just discharged back home from [facility] last week. Patient shares that she is using her walker in the home and uses her wheelchair when she uses [facility]. Patient shares that she has a PCP appointment today and has already scheduled [facility] to come pick her up for this appointment but is not able to get up and get dressed on her own. Patient states that she thought her aide would be there today to assist with this. THN CSW completed call to [facility] services with patient on the other line and was informed that patient's PT is currently on his way to patient's residence and that her Nurse Aide Idelia Salm will not start services until tomorrow and is scheduled for Tuesdays and Fridays. [facility]N CSW provided this update to patient. Patient shares that she may have to cancel her PCP appointment today but she does not wish to do this as she has had to cancel and reschedule her last 3 PCP's appointment in the past. Patient reported that her [facility] PT just arrived at her residence and she would need to get off the phone at this time. [facility] CSW informed patient that this CSW will be out of the office from 10/22/17-10/25/17.   Plan-THN CSW will follow up within two weeks and will reassess social work needs at that time.  Eula Fried, BSW, MSW, [email or web address] Phone: [phone] Fax: [phone]

## 2017-10-19 DIAGNOSIS — E1140 Type 2 diabetes mellitus with diabetic neuropathy, unspecified: Secondary | ICD-10-CM | POA: Diagnosis not present

## 2017-10-19 DIAGNOSIS — E1342 Other specified diabetes mellitus with diabetic polyneuropathy: Secondary | ICD-10-CM | POA: Diagnosis not present

## 2017-10-19 DIAGNOSIS — I10 Essential (primary) hypertension: Secondary | ICD-10-CM | POA: Diagnosis not present

## 2017-10-19 DIAGNOSIS — N390 Urinary tract infection, site not specified: Secondary | ICD-10-CM | POA: Diagnosis not present

## 2017-10-19 DIAGNOSIS — J42 Unspecified chronic bronchitis: Secondary | ICD-10-CM | POA: Diagnosis not present

## 2017-10-19 DIAGNOSIS — E1165 Type 2 diabetes mellitus with hyperglycemia: Secondary | ICD-10-CM | POA: Diagnosis not present

## 2017-10-19 DIAGNOSIS — M109 Gout, unspecified: Secondary | ICD-10-CM | POA: Diagnosis not present

## 2017-10-19 DIAGNOSIS — M797 Fibromyalgia: Secondary | ICD-10-CM | POA: Diagnosis not present

## 2017-10-19 DIAGNOSIS — M545 Low back pain: Secondary | ICD-10-CM | POA: Diagnosis not present

## 2017-10-21 ENCOUNTER — Telehealth: Payer: Self-pay | Admitting: Endocrinology

## 2017-10-21 DIAGNOSIS — M109 Gout, unspecified: Secondary | ICD-10-CM | POA: Diagnosis not present

## 2017-10-21 DIAGNOSIS — E1140 Type 2 diabetes mellitus with diabetic neuropathy, unspecified: Secondary | ICD-10-CM | POA: Diagnosis not present

## 2017-10-21 DIAGNOSIS — M797 Fibromyalgia: Secondary | ICD-10-CM | POA: Diagnosis not present

## 2017-10-21 DIAGNOSIS — E1165 Type 2 diabetes mellitus with hyperglycemia: Secondary | ICD-10-CM | POA: Diagnosis not present

## 2017-10-21 DIAGNOSIS — N390 Urinary tract infection, site not specified: Secondary | ICD-10-CM | POA: Diagnosis not present

## 2017-10-21 DIAGNOSIS — J42 Unspecified chronic bronchitis: Secondary | ICD-10-CM | POA: Diagnosis not present

## 2017-10-21 NOTE — Telephone Encounter (Signed)
LVM with AJ1 that we had not received paperwork. I asked that they fax again with attention [name] on the page.

## 2017-10-21 NOTE — Telephone Encounter (Signed)
Christy May is calling in regards to paper work they have faxed over to our office on 4/25 and if we have received these.   They are Requesting notes from and before the visit 08/09/17 on how many times she test daily.   Please advise    Phone- [phone]  Fax- [phone]

## 2017-10-22 ENCOUNTER — Other Ambulatory Visit: Payer: Self-pay | Admitting: *Deleted

## 2017-10-22 ENCOUNTER — Telehealth: Payer: Self-pay | Admitting: Endocrinology

## 2017-10-22 DIAGNOSIS — E1140 Type 2 diabetes mellitus with diabetic neuropathy, unspecified: Secondary | ICD-10-CM | POA: Diagnosis not present

## 2017-10-22 DIAGNOSIS — M109 Gout, unspecified: Secondary | ICD-10-CM | POA: Diagnosis not present

## 2017-10-22 DIAGNOSIS — J42 Unspecified chronic bronchitis: Secondary | ICD-10-CM | POA: Diagnosis not present

## 2017-10-22 DIAGNOSIS — M797 Fibromyalgia: Secondary | ICD-10-CM | POA: Diagnosis not present

## 2017-10-22 DIAGNOSIS — E1165 Type 2 diabetes mellitus with hyperglycemia: Secondary | ICD-10-CM | POA: Diagnosis not present

## 2017-10-22 DIAGNOSIS — N390 Urinary tract infection, site not specified: Secondary | ICD-10-CM | POA: Diagnosis not present

## 2017-10-22 NOTE — Telephone Encounter (Signed)
Patient stated she is out of the hospital and rehab she is home now, just giving you a fyi The hospital had her on two different insulins. Now that she is home her hospital doctor told her to take the medication Loanne Drilling prescribed  For her.

## 2017-10-22 NOTE — Patient Outreach (Signed)
[facility]) Care Management  10/22/2017  DEVANEE POMPLUN 08/04/1950 [id number]   Weekly transition of care call placed to member, no answer.  HIPAA compliant voice message left.  Outreach letter sent, will follow up within 4 business days.  [name], [place], MSN [place] [phone]

## 2017-10-24 ENCOUNTER — Ambulatory Visit (HOSPITAL_COMMUNITY)
Admission: RE | Admit: 2017-10-24 | Discharge: 2017-10-24 | Disposition: A | Discharge status: Home / Self Care | Payer: Medicare Other | Source: Ambulatory Visit | Attending: Orthopedic Surgery | Admitting: Orthopedic Surgery

## 2017-10-24 ENCOUNTER — Other Ambulatory Visit (HOSPITAL_COMMUNITY): Payer: Self-pay | Admitting: Orthopedic Surgery

## 2017-10-24 ENCOUNTER — Other Ambulatory Visit: Payer: Self-pay

## 2017-10-24 DIAGNOSIS — M25551 Pain in right hip: Secondary | ICD-10-CM | POA: Diagnosis not present

## 2017-10-25 DIAGNOSIS — E1140 Type 2 diabetes mellitus with diabetic neuropathy, unspecified: Secondary | ICD-10-CM | POA: Diagnosis not present

## 2017-10-25 DIAGNOSIS — M109 Gout, unspecified: Secondary | ICD-10-CM | POA: Diagnosis not present

## 2017-10-25 DIAGNOSIS — M48061 Spinal stenosis, lumbar region without neurogenic claudication: Secondary | ICD-10-CM | POA: Diagnosis not present

## 2017-10-25 DIAGNOSIS — M797 Fibromyalgia: Secondary | ICD-10-CM | POA: Diagnosis not present

## 2017-10-25 DIAGNOSIS — N390 Urinary tract infection, site not specified: Secondary | ICD-10-CM | POA: Diagnosis not present

## 2017-10-25 DIAGNOSIS — E1165 Type 2 diabetes mellitus with hyperglycemia: Secondary | ICD-10-CM | POA: Diagnosis not present

## 2017-10-25 DIAGNOSIS — J42 Unspecified chronic bronchitis: Secondary | ICD-10-CM | POA: Diagnosis not present

## 2017-10-26 ENCOUNTER — Other Ambulatory Visit: Payer: Self-pay | Admitting: Licensed Clinical Social Worker

## 2017-10-26 DIAGNOSIS — E1140 Type 2 diabetes mellitus with diabetic neuropathy, unspecified: Secondary | ICD-10-CM | POA: Diagnosis not present

## 2017-10-26 DIAGNOSIS — N390 Urinary tract infection, site not specified: Secondary | ICD-10-CM | POA: Diagnosis not present

## 2017-10-26 DIAGNOSIS — M797 Fibromyalgia: Secondary | ICD-10-CM | POA: Diagnosis not present

## 2017-10-26 DIAGNOSIS — E1165 Type 2 diabetes mellitus with hyperglycemia: Secondary | ICD-10-CM | POA: Diagnosis not present

## 2017-10-26 DIAGNOSIS — M109 Gout, unspecified: Secondary | ICD-10-CM | POA: Diagnosis not present

## 2017-10-26 DIAGNOSIS — J42 Unspecified chronic bronchitis: Secondary | ICD-10-CM | POA: Diagnosis not present

## 2017-10-26 NOTE — Patient Outreach (Signed)
[facility]) Care Management  10/26/2017  Christy May November 24, 1950 [id number]  Assessment-CSW completed outreach attempt today. CSW unable to reach patient successfully. CSW left a HIPPA compliant voice message encouraging patient to return call once available.  Plan-CSW will await return call or complete an additional outreach if needed within one week.  Eula Fried, BSW, MSW, [email or web address] Phone: [phone] Fax: [phone]

## 2017-10-27 ENCOUNTER — Other Ambulatory Visit: Payer: Self-pay | Admitting: *Deleted

## 2017-10-27 DIAGNOSIS — E1140 Type 2 diabetes mellitus with diabetic neuropathy, unspecified: Secondary | ICD-10-CM | POA: Diagnosis not present

## 2017-10-27 DIAGNOSIS — M797 Fibromyalgia: Secondary | ICD-10-CM | POA: Diagnosis not present

## 2017-10-27 DIAGNOSIS — E1165 Type 2 diabetes mellitus with hyperglycemia: Secondary | ICD-10-CM | POA: Diagnosis not present

## 2017-10-27 DIAGNOSIS — J42 Unspecified chronic bronchitis: Secondary | ICD-10-CM | POA: Diagnosis not present

## 2017-10-27 DIAGNOSIS — N390 Urinary tract infection, site not specified: Secondary | ICD-10-CM | POA: Diagnosis not present

## 2017-10-27 DIAGNOSIS — M109 Gout, unspecified: Secondary | ICD-10-CM | POA: Diagnosis not present

## 2017-10-27 NOTE — Patient Outreach (Signed)
[facility]) Care Management  10/27/2017  Christy May 22-Oct-1950 [id number]   Weekly transition of care call placed to member.  She report she is doing well, state her home health aide is in the home at the time of the call.  State that she is having some bleeding when urinating, think she may still have a bladder infection. Report that nurse from Christy May has already contacted MD office to report concerns and she is waiting for a call back.  State she has been compliant with medications, report blood sugars have been "good."  Has to call to schedule follow up with endocrinologist, had follow up with primary MD last week.  Denies any urgent concerns, agree to home visit next week.  THN CM Care Plan Problem One     Most Recent Value  Care Plan Problem One  at risk for readmission as evidenced by recent discharged from skilled faciliyt  Role Documenting the Problem One  Care Management Christy May for Problem One  Active  [facility] Long Term Goal   client will not be readmitted within the next 31 days.  THN Long Term Goal Start Date  10/15/17  Interventions for Problem One Long Term Goal  Educated on importance of developing long term plan for additional support once home health services are complete  THN CM Short Term Goal #1   client will attend follow up appointments as scheduled within the next 31 days.  THN CM Short Term Goal #1 Start Date  10/15/17  [facility] CM Short Term Goal #1 Met Date  10/27/17  THN CM Short Term Goal #2   client will verbalize participation in home health activiites/sessions as scheduled within the next 30 days.  THN CM Short Term Goal #2 Start Date  10/15/17  [facility] CM Short Term Goal #2 Met Date  10/27/17     Christy David, RN, MSN [place] Manager [phone]

## 2017-10-28 ENCOUNTER — Ambulatory Visit: Payer: Self-pay | Admitting: Licensed Clinical Social Worker

## 2017-10-28 DIAGNOSIS — N390 Urinary tract infection, site not specified: Secondary | ICD-10-CM | POA: Diagnosis not present

## 2017-10-28 DIAGNOSIS — M797 Fibromyalgia: Secondary | ICD-10-CM | POA: Diagnosis not present

## 2017-10-28 DIAGNOSIS — M109 Gout, unspecified: Secondary | ICD-10-CM | POA: Diagnosis not present

## 2017-10-28 DIAGNOSIS — E1140 Type 2 diabetes mellitus with diabetic neuropathy, unspecified: Secondary | ICD-10-CM | POA: Diagnosis not present

## 2017-10-28 DIAGNOSIS — E1165 Type 2 diabetes mellitus with hyperglycemia: Secondary | ICD-10-CM | POA: Diagnosis not present

## 2017-10-28 DIAGNOSIS — J42 Unspecified chronic bronchitis: Secondary | ICD-10-CM | POA: Diagnosis not present

## 2017-10-29 ENCOUNTER — Other Ambulatory Visit: Payer: Self-pay | Admitting: Licensed Clinical Social Worker

## 2017-10-29 ENCOUNTER — Other Ambulatory Visit: Payer: Self-pay | Admitting: Physical Medicine and Rehabilitation

## 2017-10-29 DIAGNOSIS — M5416 Radiculopathy, lumbar region: Secondary | ICD-10-CM

## 2017-10-29 NOTE — Patient Outreach (Addendum)
[facility]) Care Management  10/29/2017  DIMITRI DSOUZA Jan 07, 1951 [id number]  Assessment- [facility] CSW completed outreach call to patient and was able to successfully reach her. Patient provided HIPPA verifications. Patient states that things are going very well now that [place] services successfully started and that she is receiving a home health aide to assist her with bathing. Patient reports that she successfully went to her PCP appointment two weeks ago. Patient still needs make a follow up appointment with her endocrinologist. Patient has stable transportation to all medical appointments through [place]. Patient reminded that [place] will end at some point and that she may need additional support in the home but that this would have to be a private pay service. Patient reports that she does not want any service that she has to pay for as she does not have the finances to do so but states she desires to gain an aide to assist her with preparing meals and cleaning. Patient denies needing resources for any service that cost. [facility] CSW provided education on available personal care resources that are available at no cost: In [place] with DSS, Product manager, [employer] and [employer] and explained that these services have a wait list. Patient reports only being interested in [employer]. Patient shares that she is not eligible for Medicaid as her monthly income is over. She shares that she is able to get $15.00 in food stamps per month and shares that one of her church members when to [place] this past week and got her groceries. Patient reports that she was put on the wait list for [employer] back in February of this year but had not heard back. THN CSW completed call to [employer] and spoke with the [employer]. Corporate treasurer stated that patient contacted their program but never completed a referral. THN CSW successfully completed referral  and was informed that patient will have a wait of 5-7 months. Patient denies any further social work needs at this time and was Equities trader. THN CSW is happy to get back involved if any social work needs were to arise in the future.  Plan-THN CSW will update THN RNCM and PCP of social work discharge.  Eula Fried, BSW, MSW, [email or web address] Phone: [phone] Fax: [phone]

## 2017-10-30 DIAGNOSIS — E1165 Type 2 diabetes mellitus with hyperglycemia: Secondary | ICD-10-CM | POA: Diagnosis not present

## 2017-10-30 DIAGNOSIS — N390 Urinary tract infection, site not specified: Secondary | ICD-10-CM | POA: Diagnosis not present

## 2017-10-30 DIAGNOSIS — M109 Gout, unspecified: Secondary | ICD-10-CM | POA: Diagnosis not present

## 2017-10-30 DIAGNOSIS — M797 Fibromyalgia: Secondary | ICD-10-CM | POA: Diagnosis not present

## 2017-10-30 DIAGNOSIS — E1140 Type 2 diabetes mellitus with diabetic neuropathy, unspecified: Secondary | ICD-10-CM | POA: Diagnosis not present

## 2017-10-30 DIAGNOSIS — J42 Unspecified chronic bronchitis: Secondary | ICD-10-CM | POA: Diagnosis not present

## 2017-11-01 ENCOUNTER — Other Ambulatory Visit: Payer: Self-pay | Admitting: *Deleted

## 2017-11-01 DIAGNOSIS — J42 Unspecified chronic bronchitis: Secondary | ICD-10-CM | POA: Diagnosis not present

## 2017-11-01 DIAGNOSIS — E1140 Type 2 diabetes mellitus with diabetic neuropathy, unspecified: Secondary | ICD-10-CM | POA: Diagnosis not present

## 2017-11-01 DIAGNOSIS — E1165 Type 2 diabetes mellitus with hyperglycemia: Secondary | ICD-10-CM | POA: Diagnosis not present

## 2017-11-01 DIAGNOSIS — N390 Urinary tract infection, site not specified: Secondary | ICD-10-CM | POA: Diagnosis not present

## 2017-11-01 DIAGNOSIS — M797 Fibromyalgia: Secondary | ICD-10-CM | POA: Diagnosis not present

## 2017-11-01 DIAGNOSIS — M109 Gout, unspecified: Secondary | ICD-10-CM | POA: Diagnosis not present

## 2017-11-01 NOTE — Patient Outreach (Signed)
[name]) Care Management   11/01/2017  Christy May 02/16/1951 [id number]  Christy May is an 67 y.o. female  Subjective:   Member alert and oriented x3.  Complains of intermittent generalized pain, report compliance with medications.  She denies any ongoing hematuria, denies signs/symptoms of UTI or bladder infection.  Objective:   Review of Systems  Constitutional: Negative.   HENT: Negative.   Eyes: Negative.   Respiratory: Negative.   Cardiovascular: Negative.   Gastrointestinal: Negative.   Genitourinary: Negative.   Musculoskeletal: Positive for myalgias.  Skin: Negative.   Neurological: Negative.   Endo/Heme/Allergies: Negative.   Psychiatric/Behavioral: Negative.     Physical Exam  Constitutional: She is oriented to person, place, and time. She appears well-developed and well-nourished.  Neck: Normal range of motion.  Cardiovascular: Normal rate, regular rhythm and normal heart sounds.  Respiratory: Effort normal and breath sounds normal.  GI: Soft. Bowel sounds are normal.  Musculoskeletal: Normal range of motion.  Neurological: She is alert and oriented to person, place, and time.  Skin: Skin is warm and dry.   BP 140/78 (BP Location: Right Arm, Patient Position: Sitting, Cuff Size: Normal)   Pulse 84   Resp 20   SpO2 95%   Encounter Medications:   Outpatient Encounter Medications as of 11/01/2017  Medication Sig  . albuterol (PROVENTIL) (2.5 MG/3ML) 0.083% nebulizer solution Take 2.5 mg by nebulization 4 (four) times daily as needed for wheezing or shortness of breath.   . allopurinol (ZYLOPRIM) 300 MG tablet Take 300 mg by mouth daily.  Marland Kitchen aspirin EC 81 MG tablet Take 81 mg by mouth at bedtime.  . Cholecalciferol (VITAMIN D-3) 1000 units CAPS Take 2,000 Units by mouth daily.  . Cobalamine Combinations (VITAMIN B12-FOLIC ACID PO) Take 1 tablet by mouth daily.  . cycloSPORINE (RESTASIS) 0.05 % ophthalmic emulsion Place 1 drop  into both eyes 2 (two) times daily.   . fluticasone (FLONASE) 50 MCG/ACT nasal spray SHAKE LIQUID AND USE 2 SPRAYS IN EACH NOSTRIL DAILY  . folic acid (FOLVITE) 1 MG tablet Take 1 mg by mouth once a day  . gabapentin (NEURONTIN) 800 MG tablet Take 1 tablet (800 mg total) by mouth 3 (three) times daily.  Marland Kitchen glucose blood (ONETOUCH VERIO) test strip Use as instructed to test twice daily DX E11.22  . insulin NPH Human (NOVOLIN N RELION) 100 UNIT/ML injection Inject 1.2 mLs (120 Units total) into the skin every morning.  . Insulin Syringe-Needle U-100 (INSULIN SYRINGE 1CC/31GX5/16") 31G X 5/16" 1 ML MISC USE TO INJECT INSULIN TWICE DAILY  . ipratropium (ATROVENT) 0.03 % nasal spray USE 2 SPRAYS IN EACH NOSTRIL THREE TIMES DAILY AS NEEDED FOR RHINITIS  . irbesartan (AVAPRO) 300 MG tablet Take 300 mg by mouth daily.  Marland Kitchen loratadine (CLARITIN) 10 MG tablet Take 10 mg by mouth daily.  . metoprolol (LOPRESSOR) 50 MG tablet Take 75 mg by mouth 2 (two) times daily.  . montelukast (SINGULAIR) 10 MG tablet TAKE 1 TABLET(10 MG) BY MOUTH DAILY  . Multiple Vitamins-Minerals (MULTIVITAMIN WITH MINERALS) tablet Take 1 tablet by mouth daily.  . ONE TOUCH LANCETS MISC Use to check sugars twice daily DX E11.22  . oxybutynin (DITROPAN-XL) 10 MG 24 hr tablet Take 1 tablet (10 mg total) by mouth daily.  . pantoprazole (PROTONIX) 40 MG tablet TAKE 1 TABLET(40 MG) BY MOUTH TWICE DAILY  . predniSONE (DELTASONE) 10 MG tablet Take 2 tablets (20 mg total) by mouth daily with breakfast.  .  ranitidine (ZANTAC) 300 MG tablet TAKE 1 TABLET(300 MG) BY MOUTH AT BEDTIME (Patient taking differently: Take 300 mg by mouth at bedtime)  . sodium chloride (OCEAN) 0.65 % SOLN nasal spray Place 2 sprays into both nostrils 2 (two) times daily.  Marland Kitchen doxycycline (VIBRAMYCIN) 100 MG capsule Take 100 mg by mouth 2 (two) times daily.  . insulin aspart (NOVOLOG) 100 UNIT/ML injection Inject 0-12 Units into the skin See admin instructions. Inject 0-12  units into the skin before meals & at bedtime, per sliding scale: BGL <70 = call MD; 70-200 = 0 units; 201-250 = 2 units; 251-300 = 4 units; 301-350 = 6 units; 351-400 = 8 units; 401-450 = 10 units; >450 = 12 units  . Lidocaine-Menthol (LIMENCIN) 4-4 % PTCH Apply 1 patch topically See admin instructions. Apply 1 patch daily to the right shoulder/remove each evening   No facility-administered encounter medications on file as of 11/01/2017.     Functional Status:   In your present state of health, do you have any difficulty performing the following activities: 09/02/2017 08/23/2017  Hearing? N Y  Vision? N N  Difficulty concentrating or making decisions? N N  Walking or climbing stairs? Y Y  Dressing or bathing? N N  Doing errands, shopping? [name]  Preparing Food and eating ? - Y  Using the Toilet? - N  In the past six months, have you accidently leaked urine? - N  Do you have problems with loss of bowel control? - N  Managing your Medications? - Y  Managing your Finances? - Y  Housekeeping or managing your Housekeeping? - Y  Some recent data might be hidden    Fall/Depression Screening:    Fall Risk  08/23/2017 06/24/2017 03/02/2017  Falls in the past year? Yes Yes Yes  Number falls in past yr: 2 or more 1 2 or more  Injury with Fall? No Yes No  Risk Factor Category  - High Fall Risk High Fall Risk  Comment - - -  Risk for fall due to : Impaired balance/gait;History of fall(s);Impaired mobility History of fall(s);Impaired balance/gait;Impaired mobility History of fall(s);Impaired balance/gait;Impaired mobility;Medication side effect;Impaired vision  Follow up Falls prevention discussed Education provided;Falls prevention discussed Falls evaluation completed;Education provided;Falls prevention discussed   PHQ 2/9 Scores 08/23/2017 06/24/2017 03/02/2017 11/24/2016 11/11/2016 01/25/2015 04/19/2014  PHQ - 2 Score 2 0 0 0 0 0 0  PHQ- 9 Score 7 - - - - - -    Assessment:    Met with member at scheduled  time.  She is lying in hospital bed in living room, bedside commode and wheelchair within reach.  She has reacher that she is using to get to items around the bed.  Home remains unclean and full of clutter as it was when this care manager was previously involved.  Report visit complete today with PT, aide visits twice a week.  She state that she has been able to increase her activity, able to cook small meals sitting in wheelchair.  She still does not have a plan in place for when home health services end, state she will consider what will need to be done.  This care manager inquired about follow up MD appointments.  State she has already seen primary MD, does not want to schedule any more appointments until she is a little stronger.  She denies any urgent concerns at this time, advised to contact with questions.  Plan:   Will follow up next week with weekly transition  of care call.  THN CM Care Plan Problem One     Most Recent Value  Care Plan Problem One  at risk for readmission as evidenced by recent discharged from skilled facility  Role Documenting the Problem One  Care Management [place] for Problem One  Active  [facility] Long Term Goal   client will not be readmitted within the next 31 days.  THN Long Term Goal Start Date  10/15/17  Interventions for Problem One Long Term Goal  Re-educated on importance of developing a support plan once home health has completed.  Advised to consider church friends and other family members if possible     [name], Therapist, sports, MSN [place] Manager [phone]

## 2017-11-02 DIAGNOSIS — N390 Urinary tract infection, site not specified: Secondary | ICD-10-CM | POA: Diagnosis not present

## 2017-11-02 DIAGNOSIS — M797 Fibromyalgia: Secondary | ICD-10-CM | POA: Diagnosis not present

## 2017-11-02 DIAGNOSIS — E1165 Type 2 diabetes mellitus with hyperglycemia: Secondary | ICD-10-CM | POA: Diagnosis not present

## 2017-11-02 DIAGNOSIS — J42 Unspecified chronic bronchitis: Secondary | ICD-10-CM | POA: Diagnosis not present

## 2017-11-02 DIAGNOSIS — M109 Gout, unspecified: Secondary | ICD-10-CM | POA: Diagnosis not present

## 2017-11-02 DIAGNOSIS — E1140 Type 2 diabetes mellitus with diabetic neuropathy, unspecified: Secondary | ICD-10-CM | POA: Diagnosis not present

## 2017-11-03 DIAGNOSIS — J42 Unspecified chronic bronchitis: Secondary | ICD-10-CM | POA: Diagnosis not present

## 2017-11-03 DIAGNOSIS — E1165 Type 2 diabetes mellitus with hyperglycemia: Secondary | ICD-10-CM | POA: Diagnosis not present

## 2017-11-03 DIAGNOSIS — E1140 Type 2 diabetes mellitus with diabetic neuropathy, unspecified: Secondary | ICD-10-CM | POA: Diagnosis not present

## 2017-11-03 DIAGNOSIS — M797 Fibromyalgia: Secondary | ICD-10-CM | POA: Diagnosis not present

## 2017-11-03 DIAGNOSIS — N390 Urinary tract infection, site not specified: Secondary | ICD-10-CM | POA: Diagnosis not present

## 2017-11-03 DIAGNOSIS — M109 Gout, unspecified: Secondary | ICD-10-CM | POA: Diagnosis not present

## 2017-11-04 ENCOUNTER — Ambulatory Visit
Admission: RE | Admit: 2017-11-04 | Discharge: 2017-11-04 | Disposition: A | Discharge status: Home / Self Care | Payer: Medicare Other | Source: Ambulatory Visit | Attending: Physical Medicine and Rehabilitation | Admitting: Physical Medicine and Rehabilitation

## 2017-11-04 DIAGNOSIS — M5416 Radiculopathy, lumbar region: Secondary | ICD-10-CM

## 2017-11-04 DIAGNOSIS — M48061 Spinal stenosis, lumbar region without neurogenic claudication: Secondary | ICD-10-CM | POA: Diagnosis not present

## 2017-11-04 MED ORDER — IOPAMIDOL (ISOVUE-M 200) INJECTION 41%
1.0000 mL | Freq: Once | INTRAMUSCULAR | Status: AC
  Administered 2017-11-04: 1 mL via EPIDURAL

## 2017-11-04 MED ORDER — METHYLPREDNISOLONE ACETATE 40 MG/ML INJ SUSP (RADIOLOG
120.0000 mg | Freq: Once | INTRAMUSCULAR | Status: AC
  Administered 2017-11-04: 120 mg via EPIDURAL

## 2017-11-04 NOTE — Discharge Instructions (Signed)

## 2017-11-05 ENCOUNTER — Telehealth: Payer: Self-pay | Admitting: Endocrinology

## 2017-11-05 DIAGNOSIS — M797 Fibromyalgia: Secondary | ICD-10-CM | POA: Diagnosis not present

## 2017-11-05 DIAGNOSIS — J42 Unspecified chronic bronchitis: Secondary | ICD-10-CM | POA: Diagnosis not present

## 2017-11-05 DIAGNOSIS — E1140 Type 2 diabetes mellitus with diabetic neuropathy, unspecified: Secondary | ICD-10-CM | POA: Diagnosis not present

## 2017-11-05 DIAGNOSIS — M109 Gout, unspecified: Secondary | ICD-10-CM | POA: Diagnosis not present

## 2017-11-05 DIAGNOSIS — E1165 Type 2 diabetes mellitus with hyperglycemia: Secondary | ICD-10-CM | POA: Diagnosis not present

## 2017-11-05 DIAGNOSIS — N390 Urinary tract infection, site not specified: Secondary | ICD-10-CM | POA: Diagnosis not present

## 2017-11-05 NOTE — Telephone Encounter (Signed)
Patient stated early this morning her b/s was over 600 instead of taking 120 units, she took 130 units of Novolin N, it went down to 388. Just a fyI

## 2017-11-05 NOTE — Telephone Encounter (Signed)
Continue 120 units qam. Call if it doesn't cone down to at least the 200's. Needs f/u ov next week.

## 2017-11-05 NOTE — Telephone Encounter (Signed)
Pt informed of below. She states she recently got out of rehab and she is unable to come into the office at this time. She has a [place] currently coming.

## 2017-11-05 NOTE — Telephone Encounter (Signed)
Please advise 

## 2017-11-05 NOTE — Telephone Encounter (Signed)
Denise-Visiting Nurse ph# [phone] called because Patient's glucometer read high (600 and over)-she took 130 MPH insulin (normally takes 120) and now her blood sugar is 388. Please call Langley Gauss at above ph# and advise.

## 2017-11-08 ENCOUNTER — Other Ambulatory Visit: Payer: Self-pay | Admitting: *Deleted

## 2017-11-08 NOTE — Patient Outreach (Signed)
[place] [facility]) Care Management  11/08/2017  EMMAKATE HYPES 04/18/1951 [id number]   Weekly transition of care call placed to member, no answer to listed home phone, rings busy.  No answer to listed cell phone, HIPAA compliant voice message left.  Outreach letter sent, will follow up within the next 4 business days.  [name], [place], MSN [place] [phone]

## 2017-11-09 ENCOUNTER — Telehealth: Payer: Self-pay | Admitting: Endocrinology

## 2017-11-09 DIAGNOSIS — E1165 Type 2 diabetes mellitus with hyperglycemia: Secondary | ICD-10-CM | POA: Diagnosis not present

## 2017-11-09 DIAGNOSIS — M109 Gout, unspecified: Secondary | ICD-10-CM | POA: Diagnosis not present

## 2017-11-09 DIAGNOSIS — N390 Urinary tract infection, site not specified: Secondary | ICD-10-CM | POA: Diagnosis not present

## 2017-11-09 DIAGNOSIS — E1140 Type 2 diabetes mellitus with diabetic neuropathy, unspecified: Secondary | ICD-10-CM | POA: Diagnosis not present

## 2017-11-09 DIAGNOSIS — J42 Unspecified chronic bronchitis: Secondary | ICD-10-CM | POA: Diagnosis not present

## 2017-11-09 DIAGNOSIS — M797 Fibromyalgia: Secondary | ICD-10-CM | POA: Diagnosis not present

## 2017-11-09 NOTE — Telephone Encounter (Signed)
Please increase to 120 units qam. Needs ov < 2 weeks.

## 2017-11-09 NOTE — Telephone Encounter (Signed)
Please advise 

## 2017-11-09 NOTE — Telephone Encounter (Signed)
Denise from [facility] home health calling on the status of last message about patient b/s fluctuating  From [phone] to [phone].  Langley Gauss also want to change Patient insulin insulin NPH Human 100 units to 130 units. Please advise  [phone]

## 2017-11-10 DIAGNOSIS — N390 Urinary tract infection, site not specified: Secondary | ICD-10-CM | POA: Diagnosis not present

## 2017-11-10 DIAGNOSIS — E1165 Type 2 diabetes mellitus with hyperglycemia: Secondary | ICD-10-CM | POA: Diagnosis not present

## 2017-11-10 DIAGNOSIS — M109 Gout, unspecified: Secondary | ICD-10-CM | POA: Diagnosis not present

## 2017-11-10 DIAGNOSIS — E1140 Type 2 diabetes mellitus with diabetic neuropathy, unspecified: Secondary | ICD-10-CM | POA: Diagnosis not present

## 2017-11-10 DIAGNOSIS — M797 Fibromyalgia: Secondary | ICD-10-CM | POA: Diagnosis not present

## 2017-11-10 DIAGNOSIS — J42 Unspecified chronic bronchitis: Secondary | ICD-10-CM | POA: Diagnosis not present

## 2017-11-10 NOTE — Telephone Encounter (Signed)
Spoke with the patient and gave her advise from MD- patient stated she will not be able to come in for a bit she is having transportation issues and she will call us as soon as she can get this resolved so that she can come in

## 2017-11-11 DIAGNOSIS — J42 Unspecified chronic bronchitis: Secondary | ICD-10-CM | POA: Diagnosis not present

## 2017-11-11 DIAGNOSIS — M797 Fibromyalgia: Secondary | ICD-10-CM | POA: Diagnosis not present

## 2017-11-11 DIAGNOSIS — M109 Gout, unspecified: Secondary | ICD-10-CM | POA: Diagnosis not present

## 2017-11-11 DIAGNOSIS — E1165 Type 2 diabetes mellitus with hyperglycemia: Secondary | ICD-10-CM | POA: Diagnosis not present

## 2017-11-11 DIAGNOSIS — N390 Urinary tract infection, site not specified: Secondary | ICD-10-CM | POA: Diagnosis not present

## 2017-11-11 DIAGNOSIS — E1140 Type 2 diabetes mellitus with diabetic neuropathy, unspecified: Secondary | ICD-10-CM | POA: Diagnosis not present

## 2017-11-12 ENCOUNTER — Other Ambulatory Visit: Payer: Self-pay | Admitting: *Deleted

## 2017-11-12 DIAGNOSIS — J42 Unspecified chronic bronchitis: Secondary | ICD-10-CM | POA: Diagnosis not present

## 2017-11-12 DIAGNOSIS — E1140 Type 2 diabetes mellitus with diabetic neuropathy, unspecified: Secondary | ICD-10-CM | POA: Diagnosis not present

## 2017-11-12 DIAGNOSIS — M797 Fibromyalgia: Secondary | ICD-10-CM | POA: Diagnosis not present

## 2017-11-12 DIAGNOSIS — E1165 Type 2 diabetes mellitus with hyperglycemia: Secondary | ICD-10-CM | POA: Diagnosis not present

## 2017-11-12 DIAGNOSIS — N390 Urinary tract infection, site not specified: Secondary | ICD-10-CM | POA: Diagnosis not present

## 2017-11-12 DIAGNOSIS — M109 Gout, unspecified: Secondary | ICD-10-CM | POA: Diagnosis not present

## 2017-11-12 NOTE — Patient Outreach (Signed)
[place] [facility]) Care Management  11/12/2017  Christy May [birth year] [id number]   Call placed to member for weekly transition of care.  She report she fell today as she was attempting to get from bedside commode to her wheelchair.  State her wheelchair no longer locks on one side and it rolled away from her.  Had to call EMS to help get her off the floor.  She denies injury, advised to call [place] regarding equipment.  Provided with contact information.  State she received a call from [place] with [employer]  Report she has received grant allowing personal care services, unsure of when services will start or the number of days/week or hours/day.  Avised to keep in contact with him to obtain more information.  Denies any urgent concerns, will follow up next week.  THN CM Care Plan Problem One     Most Recent Value  Care Plan Problem One  at risk for readmission as evidenced by recent discharged from skilled facility  Role Documenting the Problem One  Care Management [place] for Problem One  Active  [facility] Long Term Goal   client will not be readmitted within the next 31 days.  THN Long Term Goal Start Date  10/15/17  Interventions for Problem One Long Term Goal  Advised of importance in contacting home health agency to examine the need for new equipment to decrease risk of fall and decrease risk for readmission  THN CM Short Term Goal #1   Member will report increased support starting in the home within the next 4 weeks  THN CM Short Term Goal #1 Start Date  11/12/17  Interventions for Short Term Goal #1  Advised of importance of increased support in the home in effort to decrease risk for fall.  Discussed importance of close follow up with social services      [name], [place], MSN [place] [phone]

## 2017-11-16 DIAGNOSIS — M797 Fibromyalgia: Secondary | ICD-10-CM | POA: Diagnosis not present

## 2017-11-16 DIAGNOSIS — E1140 Type 2 diabetes mellitus with diabetic neuropathy, unspecified: Secondary | ICD-10-CM | POA: Diagnosis not present

## 2017-11-16 DIAGNOSIS — N390 Urinary tract infection, site not specified: Secondary | ICD-10-CM | POA: Diagnosis not present

## 2017-11-16 DIAGNOSIS — J42 Unspecified chronic bronchitis: Secondary | ICD-10-CM | POA: Diagnosis not present

## 2017-11-16 DIAGNOSIS — M109 Gout, unspecified: Secondary | ICD-10-CM | POA: Diagnosis not present

## 2017-11-16 DIAGNOSIS — E1165 Type 2 diabetes mellitus with hyperglycemia: Secondary | ICD-10-CM | POA: Diagnosis not present

## 2017-11-17 ENCOUNTER — Emergency Department (HOSPITAL_COMMUNITY): Payer: Medicare Other

## 2017-11-17 ENCOUNTER — Observation Stay (HOSPITAL_COMMUNITY): Payer: Medicare Other

## 2017-11-17 ENCOUNTER — Encounter (HOSPITAL_COMMUNITY): Payer: Self-pay

## 2017-11-17 ENCOUNTER — Inpatient Hospital Stay (HOSPITAL_COMMUNITY)
Admission: EM | Admit: 2017-11-17 | Discharge: 2017-11-22 | DRG: 683 | Disposition: A | Discharge status: Home Health | Payer: Medicare Other | Attending: Internal Medicine | Admitting: Internal Medicine

## 2017-11-17 ENCOUNTER — Other Ambulatory Visit: Payer: Self-pay

## 2017-11-17 DIAGNOSIS — R319 Hematuria, unspecified: Secondary | ICD-10-CM

## 2017-11-17 DIAGNOSIS — E1140 Type 2 diabetes mellitus with diabetic neuropathy, unspecified: Secondary | ICD-10-CM

## 2017-11-17 DIAGNOSIS — I129 Hypertensive chronic kidney disease with stage 1 through stage 4 chronic kidney disease, or unspecified chronic kidney disease: Secondary | ICD-10-CM | POA: Diagnosis present

## 2017-11-17 DIAGNOSIS — N179 Acute kidney failure, unspecified: Secondary | ICD-10-CM | POA: Diagnosis not present

## 2017-11-17 DIAGNOSIS — S3993XA Unspecified injury of pelvis, initial encounter: Secondary | ICD-10-CM | POA: Diagnosis not present

## 2017-11-17 DIAGNOSIS — K219 Gastro-esophageal reflux disease without esophagitis: Secondary | ICD-10-CM | POA: Diagnosis present

## 2017-11-17 DIAGNOSIS — B9620 Unspecified Escherichia coli [E. coli] as the cause of diseases classified elsewhere: Secondary | ICD-10-CM | POA: Diagnosis present

## 2017-11-17 DIAGNOSIS — N183 Chronic kidney disease, stage 3 (moderate): Secondary | ICD-10-CM | POA: Diagnosis not present

## 2017-11-17 DIAGNOSIS — Z794 Long term (current) use of insulin: Secondary | ICD-10-CM

## 2017-11-17 DIAGNOSIS — Z7951 Long term (current) use of inhaled steroids: Secondary | ICD-10-CM

## 2017-11-17 DIAGNOSIS — M109 Gout, unspecified: Secondary | ICD-10-CM | POA: Diagnosis not present

## 2017-11-17 DIAGNOSIS — N390 Urinary tract infection, site not specified: Secondary | ICD-10-CM | POA: Diagnosis not present

## 2017-11-17 DIAGNOSIS — I959 Hypotension, unspecified: Secondary | ICD-10-CM | POA: Diagnosis not present

## 2017-11-17 DIAGNOSIS — E1165 Type 2 diabetes mellitus with hyperglycemia: Secondary | ICD-10-CM | POA: Diagnosis not present

## 2017-11-17 DIAGNOSIS — Z6841 Body Mass Index (BMI) 40.0 and over, adult: Secondary | ICD-10-CM | POA: Diagnosis not present

## 2017-11-17 DIAGNOSIS — R531 Weakness: Secondary | ICD-10-CM

## 2017-11-17 DIAGNOSIS — Z981 Arthrodesis status: Secondary | ICD-10-CM

## 2017-11-17 DIAGNOSIS — M25511 Pain in right shoulder: Secondary | ICD-10-CM | POA: Diagnosis not present

## 2017-11-17 DIAGNOSIS — J449 Chronic obstructive pulmonary disease, unspecified: Secondary | ICD-10-CM | POA: Diagnosis present

## 2017-11-17 DIAGNOSIS — G4730 Sleep apnea, unspecified: Secondary | ICD-10-CM | POA: Diagnosis present

## 2017-11-17 DIAGNOSIS — Z7982 Long term (current) use of aspirin: Secondary | ICD-10-CM

## 2017-11-17 DIAGNOSIS — M797 Fibromyalgia: Secondary | ICD-10-CM | POA: Diagnosis not present

## 2017-11-17 DIAGNOSIS — E860 Dehydration: Secondary | ICD-10-CM | POA: Diagnosis present

## 2017-11-17 DIAGNOSIS — G4733 Obstructive sleep apnea (adult) (pediatric): Secondary | ICD-10-CM | POA: Diagnosis present

## 2017-11-17 DIAGNOSIS — Z87891 Personal history of nicotine dependence: Secondary | ICD-10-CM

## 2017-11-17 DIAGNOSIS — G894 Chronic pain syndrome: Secondary | ICD-10-CM | POA: Diagnosis present

## 2017-11-17 DIAGNOSIS — R102 Pelvic and perineal pain: Secondary | ICD-10-CM | POA: Diagnosis not present

## 2017-11-17 DIAGNOSIS — I10 Essential (primary) hypertension: Secondary | ICD-10-CM | POA: Diagnosis present

## 2017-11-17 DIAGNOSIS — E1169 Type 2 diabetes mellitus with other specified complication: Secondary | ICD-10-CM | POA: Diagnosis present

## 2017-11-17 DIAGNOSIS — D631 Anemia in chronic kidney disease: Secondary | ICD-10-CM | POA: Diagnosis present

## 2017-11-17 DIAGNOSIS — R0902 Hypoxemia: Secondary | ICD-10-CM | POA: Diagnosis not present

## 2017-11-17 DIAGNOSIS — R6510 Systemic inflammatory response syndrome (SIRS) of non-infectious origin without acute organ dysfunction: Secondary | ICD-10-CM | POA: Diagnosis present

## 2017-11-17 DIAGNOSIS — R52 Pain, unspecified: Secondary | ICD-10-CM

## 2017-11-17 DIAGNOSIS — Z96653 Presence of artificial knee joint, bilateral: Secondary | ICD-10-CM | POA: Diagnosis present

## 2017-11-17 DIAGNOSIS — Z7952 Long term (current) use of systemic steroids: Secondary | ICD-10-CM

## 2017-11-17 DIAGNOSIS — E669 Obesity, unspecified: Secondary | ICD-10-CM | POA: Diagnosis present

## 2017-11-17 DIAGNOSIS — R310 Gross hematuria: Secondary | ICD-10-CM | POA: Diagnosis present

## 2017-11-17 DIAGNOSIS — J42 Unspecified chronic bronchitis: Secondary | ICD-10-CM | POA: Diagnosis not present

## 2017-11-17 DIAGNOSIS — N3941 Urge incontinence: Secondary | ICD-10-CM | POA: Diagnosis present

## 2017-11-17 DIAGNOSIS — J9811 Atelectasis: Secondary | ICD-10-CM | POA: Diagnosis not present

## 2017-11-17 DIAGNOSIS — E1122 Type 2 diabetes mellitus with diabetic chronic kidney disease: Secondary | ICD-10-CM | POA: Diagnosis present

## 2017-11-17 DIAGNOSIS — E11649 Type 2 diabetes mellitus with hypoglycemia without coma: Secondary | ICD-10-CM | POA: Diagnosis not present

## 2017-11-17 DIAGNOSIS — Z79899 Other long term (current) drug therapy: Secondary | ICD-10-CM

## 2017-11-17 DIAGNOSIS — W19XXXA Unspecified fall, initial encounter: Secondary | ICD-10-CM

## 2017-11-17 HISTORY — DX: Other specified cough: R05.8

## 2017-11-17 HISTORY — DX: Cough: R05

## 2017-11-17 LAB — URINALYSIS, COMPLETE (UACMP) WITH MICROSCOPIC
Bilirubin Urine: NEGATIVE
Glucose, UA: NEGATIVE mg/dL
Ketones, ur: NEGATIVE mg/dL
Nitrite: NEGATIVE
Protein, ur: 100 mg/dL — AB
RBC / HPF: >50 RBC/hpf — ABNORMAL HIGH (ref 0–5)
Specific Gravity, Urine: 1.014 (ref 1.005–1.030)
WBC, UA: >50 WBC/hpf — ABNORMAL HIGH (ref 0–5)
pH: 9 — ABNORMAL HIGH (ref 5.0–8.0)

## 2017-11-17 LAB — COMPREHENSIVE METABOLIC PANEL WITH GFR
ALT: 35 U/L (ref 14–54)
AST: 21 U/L (ref 15–41)
Albumin: 3.4 g/dL — ABNORMAL LOW (ref 3.5–5.0)
Alkaline Phosphatase: 83 U/L (ref 38–126)
Anion gap: 14 (ref 5–15)
BUN: 52 mg/dL — ABNORMAL HIGH (ref 6–20)
CO2: 26 mmol/L (ref 22–32)
Calcium: 10 mg/dL (ref 8.9–10.3)
Chloride: 100 mmol/L — ABNORMAL LOW (ref 101–111)
Creatinine, Ser: 1.82 mg/dL — ABNORMAL HIGH (ref 0.44–1.00)
GFR calc Af Amer: 32 mL/min — ABNORMAL LOW
GFR calc non Af Amer: 28 mL/min — ABNORMAL LOW
Glucose, Bld: 74 mg/dL (ref 65–99)
Potassium: 4.7 mmol/L (ref 3.5–5.1)
Sodium: 140 mmol/L (ref 135–145)
Total Bilirubin: 0.4 mg/dL (ref 0.3–1.2)
Total Protein: 7.7 g/dL (ref 6.5–8.1)

## 2017-11-17 LAB — CBC WITH DIFFERENTIAL/PLATELET
Basophils Absolute: 0 10*3/uL (ref 0.0–0.1)
Basophils Relative: 0 %
Eosinophils Absolute: 0.1 10*3/uL (ref 0.0–0.7)
Eosinophils Relative: 1 %
HCT: 41.2 % (ref 36.0–46.0)
Hemoglobin: 13.2 g/dL (ref 12.0–15.0)
Lymphocytes Relative: 12 %
Lymphs Abs: 1.3 10*3/uL (ref 0.7–4.0)
MCH: 29.2 pg (ref 26.0–34.0)
MCHC: 32 g/dL (ref 30.0–36.0)
MCV: 91.2 fL (ref 78.0–100.0)
Monocytes Absolute: 0.7 10*3/uL (ref 0.1–1.0)
Monocytes Relative: 6 %
Neutro Abs: 9.1 10*3/uL — ABNORMAL HIGH (ref 1.7–7.7)
Neutrophils Relative %: 81 %
Platelets: 274 10*3/uL (ref 150–400)
RBC: 4.52 MIL/uL (ref 3.87–5.11)
RDW: 15.6 % — ABNORMAL HIGH (ref 11.5–15.5)
WBC: 11.1 10*3/uL — ABNORMAL HIGH (ref 4.0–10.5)

## 2017-11-17 LAB — CBC
HEMATOCRIT: 37 % (ref 36.0–46.0)
Hemoglobin: 11.7 g/dL — ABNORMAL LOW (ref 12.0–15.0)
MCH: 29 pg (ref 26.0–34.0)
MCHC: 31.6 g/dL (ref 30.0–36.0)
MCV: 91.8 fL (ref 78.0–100.0)
PLATELETS: 227 10*3/uL (ref 150–400)
RBC: 4.03 MIL/uL (ref 3.87–5.11)
RDW: 15.7 % — AB (ref 11.5–15.5)
WBC: 8.8 10*3/uL (ref 4.0–10.5)

## 2017-11-17 LAB — CBG MONITORING, ED: GLUCOSE-CAPILLARY: 65 mg/dL (ref 65–99)

## 2017-11-17 MED ORDER — ONDANSETRON HCL 4 MG/2ML IJ SOLN: 4.0000 mg | Freq: Four times a day (QID) | INTRAMUSCULAR | Status: DC | PRN

## 2017-11-17 MED ORDER — HYDROCODONE-ACETAMINOPHEN 7.5-325 MG PO TABS
1.0000 | ORAL_TABLET | Freq: Four times a day (QID) | ORAL | Status: DC | PRN
  Administered 2017-11-17 – 2017-11-22 (×14): 1 via ORAL
  Filled 2017-11-17 (×15): qty 1

## 2017-11-17 MED ORDER — LEVOFLOXACIN IN D5W 500 MG/100ML IV SOLN
500.0000 mg | Freq: Once | INTRAVENOUS | Status: AC
  Administered 2017-11-17: 500 mg via INTRAVENOUS
  Filled 2017-11-17: qty 100

## 2017-11-17 MED ORDER — ENOXAPARIN SODIUM 80 MG/0.8ML ~~LOC~~ SOLN
70.0000 mg | Freq: Every day | SUBCUTANEOUS | Status: DC
  Administered 2017-11-18 – 2017-11-21 (×5): 70 mg via SUBCUTANEOUS
  Filled 2017-11-17 (×5): qty 0.8

## 2017-11-17 MED ORDER — INSULIN NPH (HUMAN) (ISOPHANE) 100 UNIT/ML ~~LOC~~ SUSP
120.0000 [IU] | Freq: Every day | SUBCUTANEOUS | Status: DC
  Administered 2017-11-18 – 2017-11-22 (×5): 120 [IU] via SUBCUTANEOUS
  Filled 2017-11-17: qty 10

## 2017-11-17 MED ORDER — METOPROLOL TARTRATE 50 MG PO TABS
75.0000 mg | ORAL_TABLET | Freq: Two times a day (BID) | ORAL | Status: DC
  Administered 2017-11-18 – 2017-11-22 (×9): 75 mg via ORAL
  Filled 2017-11-17 (×9): qty 1

## 2017-11-17 MED ORDER — ONDANSETRON HCL 4 MG PO TABS: 4.0000 mg | ORAL_TABLET | Freq: Four times a day (QID) | ORAL | Status: DC | PRN

## 2017-11-17 MED ORDER — SODIUM CHLORIDE 0.9 % IV BOLUS
1000.0000 mL | Freq: Once | INTRAVENOUS | Status: AC
  Administered 2017-11-17: 1000 mL via INTRAVENOUS

## 2017-11-17 MED ORDER — MONTELUKAST SODIUM 10 MG PO TABS
10.0000 mg | ORAL_TABLET | Freq: Every day | ORAL | Status: DC
  Administered 2017-11-18 – 2017-11-21 (×5): 10 mg via ORAL
  Filled 2017-11-17 (×5): qty 1

## 2017-11-17 MED ORDER — ASPIRIN EC 81 MG PO TBEC
81.0000 mg | DELAYED_RELEASE_TABLET | Freq: Every day | ORAL | Status: DC
  Administered 2017-11-18 – 2017-11-21 (×5): 81 mg via ORAL
  Filled 2017-11-17 (×5): qty 1

## 2017-11-17 MED ORDER — INSULIN ASPART 100 UNIT/ML ~~LOC~~ SOLN
0.0000 [IU] | Freq: Three times a day (TID) | SUBCUTANEOUS | Status: DC
  Administered 2017-11-18 (×3): 5 [IU] via SUBCUTANEOUS
  Administered 2017-11-19: 7 [IU] via SUBCUTANEOUS
  Administered 2017-11-19: 3 [IU] via SUBCUTANEOUS
  Administered 2017-11-19: 5 [IU] via SUBCUTANEOUS
  Administered 2017-11-20: 3 [IU] via SUBCUTANEOUS
  Administered 2017-11-20: 5 [IU] via SUBCUTANEOUS
  Administered 2017-11-21: 1 [IU] via SUBCUTANEOUS
  Administered 2017-11-21: 5 [IU] via SUBCUTANEOUS
  Administered 2017-11-22: 1 [IU] via SUBCUTANEOUS

## 2017-11-17 MED ORDER — ACETAMINOPHEN 325 MG PO TABS: 650.0000 mg | ORAL_TABLET | Freq: Four times a day (QID) | ORAL | Status: DC | PRN

## 2017-11-17 MED ORDER — ACETAMINOPHEN 650 MG RE SUPP: 650.0000 mg | Freq: Four times a day (QID) | RECTAL | Status: DC | PRN

## 2017-11-17 MED ORDER — HYDROCORTISONE NA SUCCINATE PF 100 MG IJ SOLR
50.0000 mg | Freq: Three times a day (TID) | INTRAMUSCULAR | Status: DC
  Administered 2017-11-17 – 2017-11-19 (×5): 50 mg via INTRAVENOUS
  Filled 2017-11-17 (×5): qty 2

## 2017-11-17 MED ORDER — ADULT MULTIVITAMIN W/MINERALS CH
1.0000 | ORAL_TABLET | Freq: Every day | ORAL | Status: DC
  Administered 2017-11-18 – 2017-11-22 (×5): 1 via ORAL
  Filled 2017-11-17 (×5): qty 1

## 2017-11-17 MED ORDER — CIPROFLOXACIN IN D5W 400 MG/200ML IV SOLN: 400.0000 mg | Freq: Two times a day (BID) | INTRAVENOUS | Status: DC

## 2017-11-17 MED ORDER — GABAPENTIN 400 MG PO CAPS
400.0000 mg | ORAL_CAPSULE | Freq: Three times a day (TID) | ORAL | Status: DC
  Administered 2017-11-18 – 2017-11-22 (×14): 400 mg via ORAL
  Filled 2017-11-17 (×14): qty 1

## 2017-11-17 MED ORDER — ALBUTEROL SULFATE (2.5 MG/3ML) 0.083% IN NEBU: 2.5000 mg | INHALATION_SOLUTION | Freq: Four times a day (QID) | RESPIRATORY_TRACT | Status: DC | PRN

## 2017-11-17 MED ORDER — CYCLOSPORINE 0.05 % OP EMUL
1.0000 [drp] | Freq: Two times a day (BID) | OPHTHALMIC | Status: DC
  Administered 2017-11-18 – 2017-11-22 (×9): 1 [drp] via OPHTHALMIC
  Filled 2017-11-17 (×11): qty 1

## 2017-11-17 MED ORDER — FOLIC ACID 1 MG PO TABS
1.0000 mg | ORAL_TABLET | Freq: Every day | ORAL | Status: DC
  Administered 2017-11-18 – 2017-11-22 (×5): 1 mg via ORAL
  Filled 2017-11-17 (×5): qty 1

## 2017-11-17 MED ORDER — PANTOPRAZOLE SODIUM 40 MG PO TBEC
40.0000 mg | DELAYED_RELEASE_TABLET | Freq: Every day | ORAL | Status: DC
  Administered 2017-11-18: 40 mg via ORAL
  Filled 2017-11-17: qty 1

## 2017-11-17 MED ORDER — SODIUM CHLORIDE 0.9 % IV SOLN
INTRAVENOUS | Status: AC
  Administered 2017-11-18 (×2): via INTRAVENOUS

## 2017-11-17 MED ORDER — SODIUM CHLORIDE 0.9 % IV SOLN
INTRAVENOUS | Status: DC
  Administered 2017-11-17 (×2): via INTRAVENOUS

## 2017-11-17 MED ORDER — ALLOPURINOL 300 MG PO TABS
300.0000 mg | ORAL_TABLET | Freq: Every day | ORAL | Status: DC
  Administered 2017-11-18 – 2017-11-22 (×5): 300 mg via ORAL
  Filled 2017-11-17 (×5): qty 1

## 2017-11-17 MED ORDER — OXYBUTYNIN CHLORIDE ER 5 MG PO TB24
10.0000 mg | ORAL_TABLET | Freq: Every day | ORAL | Status: DC
  Administered 2017-11-18 – 2017-11-22 (×5): 10 mg via ORAL
  Filled 2017-11-17 (×5): qty 2

## 2017-11-17 NOTE — H&P (Addendum)
History and Physical    JADEE GOLEBIEWSKI GDJ:[id number] DOB: 1950-08-04 DOA: 11/17/2017  PCP: Lucianne Lei, MD   Patient coming from: Home.  Chief Complaint: Weakness.  HPI: Christy May is a 67 y.o. female with history of diabetes mellitus type 2, hypertension, sleep apnea, gout, COPD on chronic steroids presents to the ER because of increasing weakness over the last 3 days.  Patient states he had at least 2 falls over the last 3 days because she felt weak on standing up and felt dizzy.  Unable to ambulate because of the weakness.  3 days ago she states that she was walking to the kitchen when she suddenly lost consciousness.  At the time she is not sure if she hit her head or not.  Denies any chest pain shortness of breath productive cough fever chills.  Patient has been having increasing pain on the right shoulder and the right hip after the fall.  Patient was admitted in March for septic shock from UTI versus GI source.  ED Course: In the ER patient is found to be initially mildly hypotensive which improved soon.  EKG shows normal sinus rhythm.  Labs show acute renal failure with creatinine of 1.8 which had increased from 0.9 in March 2 months ago.  Patient is admitted for generalized weakness with hypotension and UTI.  Review of Systems: As per HPI, rest all negative.   Past Medical History:  Diagnosis Date  . Allergy   . Anemia, unspecified   . Anxiety   . Arthritis    "neck, back, hands" (09/03/2014)  . Chronic airway obstruction, not elsewhere classified    "I was told I don't have this/tests done @ [facility] 05/2014"  . Chronic back pain   . Colon polyps   . Complication of anesthesia    "I've had recall; I probably stopped breathing at least 2 times" (09/03/2014)  . Dehydration   . Depressive disorder, not elsewhere classified   . Diabetic peripheral neuropathy ([place])   . Dysphagia 2007   historyof dysphagia with severe dysmotility by barium swallow-[name]  .  Esophageal reflux   . Family history of adverse reaction to anesthesia    "my brother was told they lost him a couple times during OR"  . Heart murmur   . History of hiatal hernia   . Migraine    "couple times/month right now" (09/03/2014)  . Obesity, unspecified   . OSA (obstructive sleep apnea)     CPAP  . Septic shock ([place]) 08/30/2017  . Spondylosis of unspecified site without mention of myelopathy   . Type II diabetes mellitus ([place])   . Unspecified essential hypertension   . Unspecified menopausal and postmenopausal disorder   . Unspecified vitamin D deficiency     Past Surgical History:  Procedure Laterality Date  . ABDOMINAL HYSTERECTOMY     "partial"  . ANTERIOR CERVICAL DECOMP/DISCECTOMY FUSION     multiple cervical spine levels;Dr. Arnoldo Morale  . APPENDECTOMY    . BACK SURGERY    . CHOLECYSTECTOMY OPEN    . COLONOSCOPY N/A 05/08/2014   Procedure: COLONOSCOPY;  Surgeon: [name], MD;  Location: [facility] ENDOSCOPY;  Service: Endoscopy;  Laterality: N/A;  . DILATION AND CURETTAGE OF UTERUS    . ESOPHAGOGASTRODUODENOSCOPY N/A 05/08/2014   Procedure: ESOPHAGOGASTRODUODENOSCOPY (EGD);  Surgeon: [name], MD;  Location: [name];  Service: Endoscopy;  Laterality: N/A;  . EXPLORATORY LAPAROTOMY    . EYE SURGERY    . FOOT  SURGERY Right X 2   "took blood out of my arms and put platelets in my feet"  . INCISION AND DRAINAGE ABSCESS     Chest  . JOINT REPLACEMENT    . REFRACTIVE SURGERY Bilateral   . TONSILLECTOMY    . TOTAL KNEE ARTHROPLASTY Left 10/2003  . TOTAL KNEE ARTHROPLASTY Right 01/2004   [name]     reports that she quit smoking about 31 years ago. Her smoking use included cigarettes. She has a 20.00 pack-year smoking history. She has never used smokeless tobacco. She reports that she drinks alcohol. She reports that she does not use drugs.  Allergies  Allergen Reactions  . Ace Inhibitors Anaphylaxis and Cough  . Penicillins Hives and Other (See Comments)      Tolerated ceftriaxone in 2019  Has patient had a PCN reaction causing immediate rash, facial/tongue/throat swelling, SOB or lightheadedness with hypotension: Yes Has patient had a PCN reaction causing severe rash involving mucus membranes or skin necrosis:  No Has patient had a PCN reaction that required hospitalization: No Has patient had a PCN reaction occurring within the last 10 years: No If all of the above answers are "NO", then may proceed with Cephalosporin use.  . Adhesive [Tape] Hives and Rash    Family History  Problem Relation Age of Onset  . Esophageal cancer Brother   . Esophageal cancer Sister        ?  . Colon polyps Brother   . Pancreatic cancer Sister        ?  . Diabetes Sister   . Diabetes Unknown        Aunt and Uncle  . Heart disease Maternal Grandfather     Prior to Admission medications   Medication Sig Start Date End Date Taking? Authorizing Provider  albuterol (PROVENTIL) (2.5 MG/3ML) 0.083% nebulizer solution Take 2.5 mg by nebulization 4 (four) times daily as needed for wheezing or shortness of breath.    Yes [provider]  allopurinol (ZYLOPRIM) 300 MG tablet Take 300 mg by mouth daily.   Yes [provider]  aspirin EC 81 MG tablet Take 81 mg by mouth at bedtime.   Yes [provider]  Cholecalciferol (VITAMIN D-3) 1000 units CAPS Take 2,000 Units by mouth daily.   Yes [provider]  Cobalamine Combinations (VITAMIN B12-FOLIC ACID PO) Take 1 tablet by mouth daily.   Yes [provider]  cycloSPORINE (RESTASIS) 0.05 % ophthalmic emulsion Place 1 drop into both eyes 2 (two) times daily.    Yes [provider]  doxycycline (VIBRAMYCIN) 100 MG capsule Take 100 mg by mouth 2 (two) times daily.   Yes [provider]  fluticasone (FLONASE) 50 MCG/ACT nasal spray SHAKE LIQUID AND USE 2 SPRAYS IN EACH NOSTRIL DAILY 03/08/17  Yes Byrum, Rose Fillers, MD  folic acid (FOLVITE) 1 MG tablet Take 1 mg  by mouth once a day 09/03/17  Yes Aline August, MD  gabapentin (NEURONTIN) 800 MG tablet Take 1 tablet (800 mg total) by mouth 3 (three) times daily. 02/27/15  Yes Biagio Borg, MD  glucose blood [facility] VERIO) test strip Use as instructed to test twice daily DX E11.22 08/23/17  Yes Renato Shin, MD  HYDROcodone-acetaminophen (NORCO) 7.5-325 MG tablet Take 1 tablet by mouth every 8 (eight) hours as needed. 10/21/17  Yes [provider]  insulin aspart (NOVOLOG) 100 UNIT/ML injection Inject 0-12 Units into the skin See admin instructions. Inject 0-12 units into the skin  before meals & at bedtime, per sliding scale: BGL <70 = call MD; 70-200 = 0 units; 201-250 = 2 units; 251-300 = 4 units; 301-350 = 6 units; 351-400 = 8 units; 401-450 = 10 units; >450 = 12 units   Yes [provider]  insulin NPH Human (NOVOLIN N RELION) 100 UNIT/ML injection Inject 1.2 mLs (120 Units total) into the skin every morning. 03/09/17  Yes Renato Shin, MD  Insulin Syringe-Needle U-100 (INSULIN SYRINGE 1CC/31GX5/16") 31G X 5/16" 1 ML MISC USE TO INJECT INSULIN TWICE DAILY 05/11/17  Yes Renato Shin, MD  ipratropium (ATROVENT) 0.03 % nasal spray USE 2 SPRAYS IN EACH NOSTRIL THREE TIMES DAILY AS NEEDED FOR RHINITIS 03/17/17  Yes Collene Gobble, MD  irbesartan (AVAPRO) 300 MG tablet Take 300 mg by mouth daily.   Yes [provider]  Lidocaine-Menthol (LIMENCIN) 4-4 % PTCH Apply 1 patch topically See admin instructions. Apply 1 patch daily to the right shoulder/remove each evening   Yes [provider]  loratadine (CLARITIN) 10 MG tablet Take 10 mg by mouth daily.   Yes [provider]  metoprolol (LOPRESSOR) 50 MG tablet Take 75 mg by mouth 2 (two) times daily.   Yes [provider]  montelukast (SINGULAIR) 10 MG tablet TAKE 1 TABLET(10 MG) BY MOUTH DAILY 04/03/16  Yes Magdalen Spatz, NP  Multiple Vitamins-Minerals (MULTIVITAMIN WITH MINERALS) tablet Take 1 tablet by mouth  daily.   Yes [provider]  ONE TOUCH LANCETS MISC Use to check sugars twice daily DX E11.22 08/23/17  Yes Renato Shin, MD  oxybutynin (DITROPAN-XL) 10 MG 24 hr tablet Take 1 tablet (10 mg total) by mouth daily. 02/18/15  Yes Biagio Borg, MD  pantoprazole (PROTONIX) 40 MG tablet TAKE 1 TABLET(40 MG) BY MOUTH TWICE DAILY 03/02/17  Yes Byrum, Rose Fillers, MD  predniSONE (DELTASONE) 10 MG tablet Take 2 tablets (20 mg total) by mouth daily with breakfast. 09/03/17  Yes Aline August, MD  ranitidine (ZANTAC) 300 MG tablet TAKE 1 TABLET(300 MG) BY MOUTH AT BEDTIME Patient taking differently: Take 300 mg by mouth at bedtime 08/09/17  Yes Nandigam, Venia Minks, MD  sodium chloride (OCEAN) 0.65 % SOLN nasal spray Place 2 sprays into both nostrils 2 (two) times daily.   Yes [provider]    Physical Exam: Vitals:   11/17/17 1930 11/17/17 1933 11/17/17 2000 11/17/17 2130  BP: 126/75  126/61 124/60  Pulse: 80  79 78  Resp: 19  14 18   Temp:    99.1 F (37.3 C)  TempSrc:    Oral  SpO2: 97%  95% 100%  Weight:  (!) 143.8 kg (317 lb)    Height:  5\' 4"  (1.626 m)        Constitutional: Moderately built and nourished. Vitals:   11/17/17 1930 11/17/17 1933 11/17/17 2000 11/17/17 2130  BP: 126/75  126/61 124/60  Pulse: 80  79 78  Resp: 19  14 18   Temp:    99.1 F (37.3 C)  TempSrc:    Oral  SpO2: 97%  95% 100%  Weight:  (!) 143.8 kg (317 lb)    Height:  5\' 4"  (1.626 m)     Eyes: Anicteric no pallor. ENMT: No discharge from the ears eyes nose or mouth. Neck: No mass felt.  No neck rigidity.  No JVD appreciated. Respiratory: No rhonchi or crepitations. Cardiovascular: S1-S2 heard no murmurs appreciated. Abdomen: Soft nontender bowel sounds present. Musculoskeletal: Pain on moving right shoulder and  right hip. Skin: No rash. Neurologic: Alert awake oriented to time place and person.  Moves all extremities 5 x 5. Psychiatric: Appears normal.  Normal affect.   Labs on  Admission: I have personally reviewed following labs and imaging studies  CBC: Recent Labs  Lab 11/17/17 1613  WBC 11.1*  NEUTROABS 9.1*  HGB 13.2  HCT 41.2  MCV 91.2  PLT 789   Basic Metabolic Panel: Recent Labs  Lab 11/17/17 1613  NA 140  K 4.7  CL 100*  CO2 26  GLUCOSE 74  BUN 52*  CREATININE 1.82*  CALCIUM 10.0   GFR: Estimated Creatinine Clearance: 42.8 mL/min (A) (by C-G formula based on SCr of 1.82 mg/dL (H)). Liver Function Tests: Recent Labs  Lab 11/17/17 1613  AST 21  ALT 35  ALKPHOS 83  BILITOT 0.4  PROT 7.7  ALBUMIN 3.4*   No results for input(s): LIPASE, AMYLASE in the last 168 hours. No results for input(s): AMMONIA in the last 168 hours. Coagulation Profile: No results for input(s): INR, PROTIME in the last 168 hours. Cardiac Enzymes: No results for input(s): CKTOTAL, CKMB, CKMBINDEX, TROPONINI in the last 168 hours. BNP (last 3 results) No results for input(s): PROBNP in the last 8760 hours. HbA1C: No results for input(s): HGBA1C in the last 72 hours. CBG: Recent Labs  Lab 11/17/17 1609  GLUCAP 65   Lipid Profile: No results for input(s): CHOL, HDL, LDLCALC, TRIG, CHOLHDL, LDLDIRECT in the last 72 hours. Thyroid Function Tests: No results for input(s): TSH, T4TOTAL, FREET4, T3FREE, THYROIDAB in the last 72 hours. Anemia Panel: No results for input(s): VITAMINB12, FOLATE, FERRITIN, TIBC, IRON, RETICCTPCT in the last 72 hours. Urine analysis:    Component Value Date/Time   COLORURINE AMBER (A) 11/17/2017 1825   APPEARANCEUR CLOUDY (A) 11/17/2017 1825   LABSPEC 1.014 11/17/2017 1825   PHURINE 9.0 (H) 11/17/2017 1825   GLUCOSEU NEGATIVE 11/17/2017 1825   GLUCOSEU NEGATIVE 12/14/2011 1707   HGBUR LARGE (A) 11/17/2017 1825   BILIRUBINUR NEGATIVE 11/17/2017 1825   BILIRUBINUR n 11/16/2014 1515   KETONESUR NEGATIVE 11/17/2017 1825   PROTEINUR 100 (A) 11/17/2017 1825   UROBILINOGEN 0.2 02/27/2015 2226   NITRITE NEGATIVE 11/17/2017  1825   LEUKOCYTESUR LARGE (A) 11/17/2017 1825   Sepsis Labs: @LABRCNTIP (procalcitonin:4,lacticidven:4) )No results found for this or any previous visit (from the past 240 hour(s)).   Radiological Exams on Admission: Dg Chest 2 View  Result Date: 11/17/2017 CLINICAL DATA:  Weakness for 2 days. EXAM: CHEST - 2 VIEW COMPARISON:  May 02, 2018 FINDINGS: Stable cardiomegaly. The hila and mediastinum are stable. No pulmonary nodules or masses. Mild atelectasis in the bases. No focal infiltrate. IMPRESSION: No active cardiopulmonary disease. Electronically Signed   By: Dorise Bullion III M.D   On: 11/17/2017 16:38   Dg Pelvis 1-2 Views  Result Date: 11/17/2017 CLINICAL DATA:  Status post fall, pain EXAM: PELVIS - 1-2 VIEW COMPARISON:  None. FINDINGS: There is no evidence of pelvic fracture or diastasis. No pelvic bone lesions are seen. IMPRESSION: Negative. Electronically Signed   By: Kathreen Devoid   On: 11/17/2017 21:10   Dg Shoulder Right  Result Date: 11/17/2017 CLINICAL DATA:  Patient has had weakness x2 days. A lot of falls recently. Patient just got out of a rehab facility for weakness and just got a wheelchair for home. PT states she has generalized right shoulder pain and bottom pain. EXAM: RIGHT SHOULDER - 2+ VIEW COMPARISON:  None. FINDINGS: There is no fracture  or dislocation. There are moderate degenerative changes of the acromioclavicular joint. IMPRESSION: No acute injury of the shoulder. Electronically Signed   By: Kathreen Devoid   On: 11/17/2017 21:45   Ct Head Wo Contrast  Result Date: 11/17/2017 CLINICAL DATA:  Weakness x2 days, UTI EXAM: CT HEAD WITHOUT CONTRAST TECHNIQUE: Contiguous axial images were obtained from the base of the skull through the vertex without intravenous contrast. COMPARISON:  08/30/2017 FINDINGS: Brain: No evidence of acute infarction, hemorrhage, hydrocephalus, extra-axial collection or mass lesion/mass effect. Subcortical white matter and periventricular  small vessel ischemic changes. Vascular: Mild intracranial atherosclerosis. Skull: Normal. Negative for fracture or focal lesion. Sinuses/Orbits: The visualized paranasal sinuses are essentially clear. The mastoid air cells are unopacified. Other: None. IMPRESSION: No evidence of acute intracranial abnormality. Mild small vessel ischemic changes. Electronically Signed   By: Julian Hy M.D.   On: 11/17/2017 20:46    EKG: Independently reviewed.  Normal sinus rhythm.  Assessment/Plan Active Problems:   Essential hypertension   Sleep apnea   Chronic pain syndrome   Weakness generalized   Acute renal failure (ARF) (HCC)   SIRS (systemic inflammatory response syndrome) (HCC)   Diabetes mellitus type 2 in obese (HCC)   Acute lower UTI   Weakness    1. Generalized weakness with hypotension -likely from dehydration.  No definite signs of sepsis.  Continue with gentle hydration hold ARB get physical therapy consult.  I will also place patient on stress dose steroids.  Check cardiac markers.  Patient had a 2D echo in March which showed EF of 50 to 60%. 2. UTI -patient placed on Cipro.  Follow urine cultures. 3. Acute renal failure likely from poor oral intake.  Gently hydrate hold ARB.  Follow intake output. 4. Diabetes mellitus type 2 on NPH 120 units in the morning which will be continued.  Closely follow CBGs. 5. Chronic pain syndrome on Neurontin which dose to be decreased from 800-400 3 times daily because of renal failure. 6. Sleep apnea on CPAP. 7. Right shoulder and hip pain -x-rays are negative for anything acute. 8. Possible recent fall with syncope -closely monitor in telemetry.  We will get physical therapy consult.  Recent 2D echo showed EF of 50 to 60%.  Cycle cardiac markers. 9. History of COPD on chronic steroids we will keep patient on stress dose steroids. 10. Hypertension due to low normal blood pressure on presentation holding antihypertensive and keeping patient on PRN IV  hydralazine.  We will continue metoprolol tomorrow if blood pressure allows. 11. History of gout on allopurinol. 12. She states she did notice some dark stools last 2 to 3 days.  Will follow serial CBCs.   DVT prophylaxis: Lovenox. Code Status: Full code. Family Communication: Discussed with patient. Disposition Plan: Home. Consults called: Physical therapy.  Education officer, museum. Admission status: Observation.   Rise Patience MD [facility] Pager [phone].  If 7PM-7AM, please contact night-coverage [email or web address] Password [facility]  11/17/2017, 10:31 PM

## 2017-11-17 NOTE — ED Notes (Signed)
Patient aware we need urine sample. Patient placed on pure wick.  

## 2017-11-17 NOTE — ED Provider Notes (Signed)
[facility] DEPT Provider Note   CSN: [id number] Arrival date & time: 11/17/17  1425     History   Chief Complaint Chief Complaint  Patient presents with  . Weakness    HPI Christy May is a 67 y.o. female.  HPI Presents with concern of weakness, difficulty with ambulating, without focal deficit. Patient notes recent rehab stay, and now she has been home for about 1 month. Over the past week she has been progressively more weak, with difficulty ambulating, even with assistive devices. She had one minor partial fall, but no head trauma, no obvious loss of consciousness episodes. She denies new pain, does have some chronic pain issues, unchanged. She denies nausea, vomiting, diarrhea. She does complain of polyuria, with malodorous urine. She denies changes in medication, activity, diet.  Past Medical History:  Diagnosis Date  . Allergy   . Anemia, unspecified   . Anxiety   . Arthritis    "neck, back, hands" (09/03/2014)  . Chronic airway obstruction, not elsewhere classified    "I was told I don't have this/tests done @ [facility] 05/2014"  . Chronic back pain   . Colon polyps   . Complication of anesthesia    "I've had recall; I probably stopped breathing at least 2 times" (09/03/2014)  . Dehydration   . Depressive disorder, not elsewhere classified   . Diabetic peripheral neuropathy ([place])   . Dysphagia 2007   historyof dysphagia with severe dysmotility by barium swallow-[name]  . Esophageal reflux   . Family history of adverse reaction to anesthesia    "my brother was told they lost him a couple times during OR"  . Heart murmur   . History of hiatal hernia   . Migraine    "couple times/month right now" (09/03/2014)  . Obesity, unspecified   . OSA (obstructive sleep apnea)     CPAP  . Septic shock ([place]) 08/30/2017  . Spondylosis of unspecified site without mention of myelopathy   . Type II diabetes mellitus ([place])   .  Unspecified essential hypertension   . Unspecified menopausal and postmenopausal disorder   . Unspecified vitamin D deficiency     Patient Active Problem List   Diagnosis Date Noted  . Septic shock ([place]) 08/30/2017  . Acute renal failure (ARF) ([place]) 08/13/2017  . Pressure injury of skin 08/13/2017  . Sepsis due to gram-negative UTI ([place]) 08/12/2017  . Upper airway cough syndrome 09/28/2016  . Acute bronchitis 10/29/2015  . Diabetes ([place]) 08/21/2015  . Chest pain 07/14/2015  . Dyspnea on exertion 07/05/2015  . Obstructive sleep apnea 07/05/2015  . Constipation 03/05/2015  . Allergic rhinitis 03/05/2015  . Hearing loss 03/05/2015  . Vocal cord dysfunction 03/05/2015  . Pain in joint, shoulder region 09/11/2014  . Weakness generalized 09/02/2014  . Acute esophagitis 05/08/2014  . Benign neoplasm of sigmoid colon 05/08/2014  . Change in bowel habits 04/17/2014  . Fecal incontinence 04/17/2014  . Urge incontinence 04/08/2014  . Cough 12/06/2013  . Chronic pain syndrome 08/24/2013  . Orthostasis 07/18/2013  . Acute kidney injury ([place]) 07/18/2013  . Bilateral carpal tunnel syndrome 02/14/2013  . Gout 01/04/2013  . Failure to thrive 06/28/2012  . Gait disorder 06/28/2012  . Lower abdominal pain 12/14/2011  . Preventative health care 08/23/2011  . Cervical radiculopathy 02/11/2011  . Low back pain 02/11/2011  . VITAMIN D DEFICIENCY 11/22/2009  . ANEMIA-NOS 11/22/2009  . DEGENERATIVE JOINT DISEASE, CERVICAL SPINE 11/22/2009  . Depression 09/16/2009  .  POLYNEUROPATHY 09/16/2009  . Morbid obesity ([place]) 09/09/2009  . Essential hypertension 09/09/2009  . GERD 09/09/2009  . Sleep apnea 09/09/2009    Past Surgical History:  Procedure Laterality Date  . ABDOMINAL HYSTERECTOMY     "partial"  . ANTERIOR CERVICAL DECOMP/DISCECTOMY FUSION     multiple cervical spine levels;Dr. Arnoldo Morale  . APPENDECTOMY    . BACK SURGERY    . CHOLECYSTECTOMY OPEN    . COLONOSCOPY N/A 05/08/2014    Procedure: COLONOSCOPY;  Surgeon: [name], MD;  Location: [facility] ENDOSCOPY;  Service: Endoscopy;  Laterality: N/A;  . DILATION AND CURETTAGE OF UTERUS    . ESOPHAGOGASTRODUODENOSCOPY N/A 05/08/2014   Procedure: ESOPHAGOGASTRODUODENOSCOPY (EGD);  Surgeon: [name], MD;  Location: [name];  Service: Endoscopy;  Laterality: N/A;  . EXPLORATORY LAPAROTOMY    . EYE SURGERY    . FOOT SURGERY Right X 2   "took blood out of my arms and put platelets in my feet"  . INCISION AND DRAINAGE ABSCESS     Chest  . JOINT REPLACEMENT    . REFRACTIVE SURGERY Bilateral   . TONSILLECTOMY    . TOTAL KNEE ARTHROPLASTY Left 10/2003  . TOTAL KNEE ARTHROPLASTY Right 01/2004   [name]     OB History   None      Home Medications    Prior to Admission medications   Medication Sig Start Date End Date Taking? Authorizing Provider  albuterol (PROVENTIL) (2.5 MG/3ML) 0.083% nebulizer solution Take 2.5 mg by nebulization 4 (four) times daily as needed for wheezing or shortness of breath.    Yes [provider]  allopurinol (ZYLOPRIM) 300 MG tablet Take 300 mg by mouth daily.   Yes [provider]  aspirin EC 81 MG tablet Take 81 mg by mouth at bedtime.   Yes [provider]  Cholecalciferol (VITAMIN D-3) 1000 units CAPS Take 2,000 Units by mouth daily.   Yes [provider]  Cobalamine Combinations (VITAMIN B12-FOLIC ACID PO) Take 1 tablet by mouth daily.   Yes [provider]  cycloSPORINE (RESTASIS) 0.05 % ophthalmic emulsion Place 1 drop into both eyes 2 (two) times daily.    Yes [provider]  doxycycline (VIBRAMYCIN) 100 MG capsule Take 100 mg by mouth 2 (two) times daily.   Yes [provider]  fluticasone (FLONASE) 50 MCG/ACT nasal spray SHAKE LIQUID AND USE 2 SPRAYS IN EACH NOSTRIL DAILY 03/08/17  Yes Byrum, Rose Fillers, MD  folic acid (FOLVITE) 1 MG tablet Take 1 mg by mouth once a day 09/03/17  Yes Aline August, MD  gabapentin  (NEURONTIN) 800 MG tablet Take 1 tablet (800 mg total) by mouth 3 (three) times daily. 02/27/15  Yes Biagio Borg, MD  glucose blood [facility] VERIO) test strip Use as instructed to test twice daily DX E11.22 08/23/17  Yes Renato Shin, MD  HYDROcodone-acetaminophen (NORCO) 7.5-325 MG tablet Take 1 tablet by mouth every 8 (eight) hours as needed. 10/21/17  Yes [provider]  insulin aspart (NOVOLOG) 100 UNIT/ML injection Inject 0-12 Units into the skin See admin instructions. Inject 0-12 units into the skin before meals & at bedtime, per sliding scale: BGL <70 = call MD; 70-200 = 0 units; 201-250 = 2 units; 251-300 = 4 units; 301-350 = 6 units; 351-400 = 8 units; 401-450 = 10 units; >450 = 12 units   Yes [provider]  insulin NPH Human (NOVOLIN N RELION) 100 UNIT/ML injection Inject 1.2 mLs (120 Units total)  into the skin every morning. 03/09/17  Yes Renato Shin, MD  Insulin Syringe-Needle U-100 (INSULIN SYRINGE 1CC/31GX5/16") 31G X 5/16" 1 ML MISC USE TO INJECT INSULIN TWICE DAILY 05/11/17  Yes Renato Shin, MD  ipratropium (ATROVENT) 0.03 % nasal spray USE 2 SPRAYS IN EACH NOSTRIL THREE TIMES DAILY AS NEEDED FOR RHINITIS 03/17/17  Yes Collene Gobble, MD  irbesartan (AVAPRO) 300 MG tablet Take 300 mg by mouth daily.   Yes [provider]  Lidocaine-Menthol (LIMENCIN) 4-4 % PTCH Apply 1 patch topically See admin instructions. Apply 1 patch daily to the right shoulder/remove each evening   Yes [provider]  loratadine (CLARITIN) 10 MG tablet Take 10 mg by mouth daily.   Yes [provider]  metoprolol (LOPRESSOR) 50 MG tablet Take 75 mg by mouth 2 (two) times daily.   Yes [provider]  montelukast (SINGULAIR) 10 MG tablet TAKE 1 TABLET(10 MG) BY MOUTH DAILY 04/03/16  Yes Magdalen Spatz, NP  Multiple Vitamins-Minerals (MULTIVITAMIN WITH MINERALS) tablet Take 1 tablet by mouth daily.   Yes [provider]  ONE TOUCH LANCETS MISC Use to  check sugars twice daily DX E11.22 08/23/17  Yes Renato Shin, MD  oxybutynin (DITROPAN-XL) 10 MG 24 hr tablet Take 1 tablet (10 mg total) by mouth daily. 02/18/15  Yes Biagio Borg, MD  pantoprazole (PROTONIX) 40 MG tablet TAKE 1 TABLET(40 MG) BY MOUTH TWICE DAILY 03/02/17  Yes Byrum, Rose Fillers, MD  predniSONE (DELTASONE) 10 MG tablet Take 2 tablets (20 mg total) by mouth daily with breakfast. 09/03/17  Yes Aline August, MD  ranitidine (ZANTAC) 300 MG tablet TAKE 1 TABLET(300 MG) BY MOUTH AT BEDTIME Patient taking differently: Take 300 mg by mouth at bedtime 08/09/17  Yes Nandigam, Venia Minks, MD  sodium chloride (OCEAN) 0.65 % SOLN nasal spray Place 2 sprays into both nostrils 2 (two) times daily.   Yes [provider]    Family History Family History  Problem Relation Age of Onset  . Esophageal cancer Brother   . Esophageal cancer Sister        ?  . Colon polyps Brother   . Pancreatic cancer Sister        ?  . Diabetes Sister   . Diabetes Unknown        Aunt and Uncle  . Heart disease Maternal Grandfather     Social History Social History   Tobacco Use  . Smoking status: Former Smoker    Packs/day: 1.00    Years: 20.00    Pack years: 20.00    Types: Cigarettes    Last attempt to quit: 06/22/1986    Years since quitting: 31.4  . Smokeless tobacco: Never Used  Substance Use Topics  . Alcohol use: Yes  . Drug use: No     Allergies   Ace inhibitors; Penicillins; and Adhesive [tape]   Review of Systems Review of Systems  Constitutional:       Per HPI, otherwise negative  HENT:       Per HPI, otherwise negative  Respiratory:       Per HPI, otherwise negative  Cardiovascular:       Per HPI, otherwise negative  Gastrointestinal: Negative for vomiting.  Endocrine:       Negative aside from HPI  Genitourinary:       Neg aside from HPI   Musculoskeletal:       Per HPI, otherwise negative  Skin: Negative.   Neurological: Positive  for weakness. Negative for  syncope.     Physical Exam Updated Vital Signs BP 126/75   Pulse 80   Temp 98.4 F (36.9 C) (Oral)   Resp 19   Ht 5\' 4"  (1.626 m)   Wt (!) 143.8 kg (317 lb)   SpO2 97%   BMI 54.41 kg/m   Physical Exam  Constitutional: She is oriented to person, place, and time. She appears well-developed and well-nourished. No distress.  Morbidly obese elderly female sitting upright speaking clearly  HENT:  Head: Normocephalic and atraumatic.  Eyes: Conjunctivae and EOM are normal.  Cardiovascular: Normal rate and regular rhythm.  Pulmonary/Chest: Effort normal and breath sounds normal. No stridor. No respiratory distress.  Abdominal: She exhibits no distension.  Musculoskeletal: She exhibits no edema.  Neurological: She is alert and oriented to person, place, and time. She displays no tremor. No cranial nerve deficit. She displays no seizure activity. Coordination normal.  Skin: Skin is warm and dry.  Psychiatric: She has a normal mood and affect.  Nursing note and vitals reviewed.    ED Treatments / Results  Labs (all labs ordered are listed, but only abnormal results are displayed) Labs Reviewed  COMPREHENSIVE METABOLIC PANEL - Abnormal; Notable for the following components:      Result Value   Chloride 100 (*)    BUN 52 (*)    Creatinine, Ser 1.82 (*)    Albumin 3.4 (*)    GFR calc non Af Amer 28 (*)    GFR calc Af Amer 32 (*)    All other components within normal limits  CBC WITH DIFFERENTIAL/PLATELET - Abnormal; Notable for the following components:   WBC 11.1 (*)    RDW 15.6 (*)    Neutro Abs 9.1 (*)    All other components within normal limits  URINALYSIS, COMPLETE (UACMP) WITH MICROSCOPIC - Abnormal; Notable for the following components:   Color, Urine AMBER (*)    APPearance CLOUDY (*)    pH 9.0 (*)    Hgb urine dipstick LARGE (*)    Protein, ur 100 (*)    Leukocytes, UA LARGE (*)    RBC / HPF >50 (*)    WBC, UA >50 (*)    Bacteria, UA RARE (*)    All other  components within normal limits  CBG MONITORING, ED    EKG EKG Interpretation  Date/Time:  Wednesday Nov 17 2017 16:06:54 EDT Ventricular Rate:  82 PR Interval:    QRS Duration: 77 QT Interval:  359 QTC Calculation: 420 R Axis:   5 Text Interpretation:  Sinus rhythm Artifact Abnormal ekg Confirmed by Carmin Muskrat [phone]) on 11/17/2017 4:38:26 PM   Radiology Dg Chest 2 View  Result Date: 11/17/2017 CLINICAL DATA:  Weakness for 2 days. EXAM: CHEST - 2 VIEW COMPARISON:  May 02, 2018 FINDINGS: Stable cardiomegaly. The hila and mediastinum are stable. No pulmonary nodules or masses. Mild atelectasis in the bases. No focal infiltrate. IMPRESSION: No active cardiopulmonary disease. Electronically Signed   By: Dorise Bullion III M.D   On: 11/17/2017 16:38    Procedures Procedures (including critical care time)  Medications Ordered in ED Medications  sodium chloride 0.9 % bolus 1,000 mL (0 mLs Intravenous Stopped 11/17/17 1819)    And  0.9 %  sodium chloride infusion ( Intravenous New Bag/Given 11/17/17 1627)  levofloxacin (LEVAQUIN) IVPB 500 mg (has no administration in time range)     Initial Impression / Assessment and Plan / ED Course  I  have reviewed the triage vital signs and the nursing notes.  Pertinent labs & imaging results that were available during my care of the patient were reviewed by me and considered in my medical decision making (see chart for details).    Initial labs reassuring. On initial repeat exam the patient is awake and alert, eating a sandwich. She did have hypotension initially, but this has improved, and she has received greater than 1 L fluid resuscitation so far. 7:49 PM On repeat exam the patient is in similar condition, fatigued, though her blood pressure has improved substantially. Urinalysis consistent with infection, and given his for acute kidney injury, and her episode of hypotension, though she is afebrile, and has improved here, she  will require admission for further evaluation and management. Initial antibiotics have been provided, Levaquin due to penicillin allergy.   Final Clinical Impressions(s) / ED Diagnoses  UTI Hypotension Acute kidney injury   Carmin Muskrat, MD 11/17/17 1950

## 2017-11-17 NOTE — ED Triage Notes (Signed)
Patient arrived via [facility] from home. Patient has had weakness x2 days. No chest pain, denies n/v. Home healthcare said she had dark tarry stools today, burning urination, frequent urination with odor. Normally patient is able to to help transfer herself but had not been able to help for several days now due to weakness. A lot of falls recently. Patient just got out of a rehab facility for weakness and just got a wheelchair for home.

## 2017-11-17 NOTE — ED Notes (Signed)
ED TO INPATIENT HANDOFF REPORT  Name/Age/Gender Christy May 67 y.o. female  Code Status Code Status History    Date Active Date Inactive Code Status Order ID Comments User Context   08/30/2017 1756 09/03/2017 2130 Full Code [id number]  Tarry Kos, MD ED   08/13/2017 0135 08/17/2017 1820 Full Code [id number]  Karmen Bongo, MD Inpatient   07/14/2015 0029 07/16/2015 2216 Full Code [id number]  Gennaro Africa, MD ED   09/02/2014 0919 09/05/2014 1738 Full Code [id number]  Dhungel, Flonnie Overman, MD Inpatient    Advance Directive Documentation     Most Recent Value  Type of Advance Directive  Living will  Pre-existing out of facility DNR order (yellow form or pink MOST form)  -  "MOST" Form in Place?  -      Home/SNF/Other Home  Chief Complaint Weakness  Level of Care/Admitting Diagnosis ED Disposition    ED Disposition Condition Esmeralda: [facility] [100102]  Level of Care: Telemetry [5]  Admit to tele based on following criteria: Monitor for Ischemic changes  Diagnosis: SIRS (systemic inflammatory response syndrome) [facility]) [[id number]]  Admitting Physician: Rise Patience [phone]  Attending Physician: Rise Patience [email or web address]  PT Class (Do Not Modify): Observation [104]  PT Acc Code (Do Not Modify): Observation [10022]       Medical History Past Medical History:  Diagnosis Date  . Allergy   . Anemia, unspecified   . Anxiety   . Arthritis    "neck, back, hands" (09/03/2014)  . Chronic airway obstruction, not elsewhere classified    "I was told I don't have this/tests done @ [facility] 05/2014"  . Chronic back pain   . Colon polyps   . Complication of anesthesia    "I've had recall; I probably stopped breathing at least 2 times" (09/03/2014)  . Dehydration   . Depressive disorder, not elsewhere classified   . Diabetic peripheral neuropathy ([place])   . Dysphagia 2007   historyof dysphagia with severe dysmotility by barium  swallow-[name]  . Esophageal reflux   . Family history of adverse reaction to anesthesia    "my brother was told they lost him a couple times during OR"  . Heart murmur   . History of hiatal hernia   . Migraine    "couple times/month right now" (09/03/2014)  . Obesity, unspecified   . OSA (obstructive sleep apnea)     CPAP  . Septic shock ([place]) 08/30/2017  . Spondylosis of unspecified site without mention of myelopathy   . Type II diabetes mellitus ([place])   . Unspecified essential hypertension   . Unspecified menopausal and postmenopausal disorder   . Unspecified vitamin D deficiency     Allergies Allergies  Allergen Reactions  . Ace Inhibitors Anaphylaxis and Cough  . Penicillins Hives and Other (See Comments)    Tolerated ceftriaxone in 2019  Has patient had a PCN reaction causing immediate rash, facial/tongue/throat swelling, SOB or lightheadedness with hypotension: Yes Has patient had a PCN reaction causing severe rash involving mucus membranes or skin necrosis:  No Has patient had a PCN reaction that required hospitalization: No Has patient had a PCN reaction occurring within the last 10 years: No If all of the above answers are "NO", then may proceed with Cephalosporin use.  . Adhesive [Tape] Hives and Rash    IV Location/Drains/Wounds Patient Lines/Drains/Airways Status   Active Line/Drains/Airways    Name:   Placement date:   Placement time:  Site:   Days:   Peripheral IV 11/17/17 Left Antecubital   11/17/17    1438    Antecubital   less than 1   External Urinary Catheter   08/30/17    2000    -   79   Pressure Injury 08/13/17 Stage II -  Partial thickness loss of dermis presenting as a shallow open ulcer with a red, pink wound bed without slough.   08/13/17    0030     96   Pressure Injury 08/12/17 Stage II -  Partial thickness loss of dermis presenting as a shallow open ulcer with a red, pink wound bed without slough.   08/12/17    0030     97           Labs/Imaging Results for orders placed or performed during the hospital encounter of 11/17/17 (from the past 48 hour(s))  CBG monitoring, ED     Status: None   Collection Time: 11/17/17  4:09 PM  Result Value Ref Range   Glucose-Capillary 65 65 - 99 mg/dL  Comprehensive metabolic panel     Status: Abnormal   Collection Time: 11/17/17  4:13 PM  Result Value Ref Range   Sodium 140 135 - 145 mmol/L   Potassium 4.7 3.5 - 5.1 mmol/L   Chloride 100 (L) 101 - 111 mmol/L   CO2 26 22 - 32 mmol/L   Glucose, Bld 74 65 - 99 mg/dL   BUN 52 (H) 6 - 20 mg/dL   Creatinine, Ser 1.82 (H) 0.44 - 1.00 mg/dL   Calcium 10.0 8.9 - 10.3 mg/dL   Total Protein 7.7 6.5 - 8.1 g/dL   Albumin 3.4 (L) 3.5 - 5.0 g/dL   AST 21 15 - 41 U/L   ALT 35 14 - 54 U/L   Alkaline Phosphatase 83 38 - 126 U/L   Total Bilirubin 0.4 0.3 - 1.2 mg/dL   GFR calc non Af Amer 28 (L) >60 mL/min   GFR calc Af Amer 32 (L) >60 mL/min    Comment: (NOTE) The eGFR has been calculated using the CKD EPI equation. This calculation has not been validated in all clinical situations. eGFR's persistently <60 mL/min signify possible Chronic Kidney Disease.    Anion gap 14 5 - 15    Comment: Performed at [facility], [place] [address], [place], [place] [address]  CBC WITH DIFFERENTIAL     Status: Abnormal   Collection Time: 11/17/17  4:13 PM  Result Value Ref Range   WBC 11.1 (H) 4.0 - 10.5 K/uL   RBC 4.52 3.87 - 5.11 MIL/uL   Hemoglobin 13.2 12.0 - 15.0 g/dL   HCT 41.2 36.0 - 46.0 %   MCV 91.2 78.0 - 100.0 fL   MCH 29.2 26.0 - 34.0 pg   MCHC 32.0 30.0 - 36.0 g/dL   RDW 15.6 (H) 11.5 - 15.5 %   Platelets 274 150 - 400 K/uL   Neutrophils Relative % 81 %   Neutro Abs 9.1 (H) 1.7 - 7.7 K/uL   Lymphocytes Relative 12 %   Lymphs Abs 1.3 0.7 - 4.0 K/uL   Monocytes Relative 6 %   Monocytes Absolute 0.7 0.1 - 1.0 K/uL   Eosinophils Relative 1 %   Eosinophils Absolute 0.1 0.0 - 0.7 K/uL   Basophils Relative 0 %    Basophils Absolute 0.0 0.0 - 0.1 K/uL    Comment: Performed at [facility], [place] [address] [place],  [place] [address]  Urinalysis, Complete w Microscopic     Status: Abnormal   Collection Time: 11/17/17  6:25 PM  Result Value Ref Range   Color, Urine AMBER (A) YELLOW    Comment: BIOCHEMICALS MAY BE AFFECTED BY COLOR   APPearance CLOUDY (A) CLEAR   Specific Gravity, Urine 1.014 1.005 - 1.030   pH 9.0 (H) 5.0 - 8.0   Glucose, UA NEGATIVE NEGATIVE mg/dL   Hgb urine dipstick LARGE (A) NEGATIVE   Bilirubin Urine NEGATIVE NEGATIVE   Ketones, ur NEGATIVE NEGATIVE mg/dL   Protein, ur 100 (A) NEGATIVE mg/dL   Nitrite NEGATIVE NEGATIVE   Leukocytes, UA LARGE (A) NEGATIVE   RBC / HPF >50 (H) 0 - 5 RBC/hpf   WBC, UA >50 (H) 0 - 5 WBC/hpf   Bacteria, UA RARE (A) NONE SEEN   Squamous Epithelial / LPF 0-5 0 - 5    Comment: Performed at [facility], [place] [address] [place],  [address]   *Note: Due to a large number of results and/or encounters for the requested time period, some results have not been displayed. A complete set of results can be found in Results Review.   Dg Chest 2 View  Result Date: 11/17/2017 CLINICAL DATA:  Weakness for 2 days. EXAM: CHEST - 2 VIEW COMPARISON:  May 02, 2018 FINDINGS: Stable cardiomegaly. The hila and mediastinum are stable. No pulmonary nodules or masses. Mild atelectasis in the bases. No focal infiltrate. IMPRESSION: No active cardiopulmonary disease. Electronically Signed   By: Dorise Bullion III M.D   On: 11/17/2017 16:38    Pending Labs Unresulted Labs (From admission, onward)   None      Vitals/Pain Today's Vitals   11/17/17 1900 11/17/17 1930 11/17/17 1930 11/17/17 1933  BP:  126/75    Pulse: 82 80    Resp: 16 19    Temp:      TempSrc:      SpO2: 95% 97%    Weight:    (!) 317 lb (143.8 kg)  Height:    '5\' 4"'  (1.626 m)  PainSc:   8      Isolation Precautions No active  isolations  Medications Medications  sodium chloride 0.9 % bolus 1,000 mL (0 mLs Intravenous Stopped 11/17/17 1819)    And  0.9 %  sodium chloride infusion ( Intravenous New Bag/Given 11/17/17 1627)  levofloxacin (LEVAQUIN) IVPB 500 mg (has no administration in time range)  hydrocortisone sodium succinate (SOLU-CORTEF) 100 MG injection 50 mg (has no administration in time range)    Mobility non-ambulatory

## 2017-11-17 NOTE — Progress Notes (Signed)
Rx Brief note: Lovenox  Wt=143 kg, CrCl=42 ml/min, BMI=54  Rx adjusted Lovenox to 70 mg daily (0.5 mg/kg) for BMI>30  Thanks Dorrene German 11/17/2017 10:39 PM

## 2017-11-17 NOTE — Progress Notes (Signed)
Pharmacy Antibiotic Note  Christy May is a 67 y.o. female admitted on 11/17/2017 with UTI.  Pharmacy has been consulted for cipro dosing.  Plan: Cipro 400 mg IV q12h F/u scr/cultures  Height: 5\' 4"  (162.6 cm) Weight: (!) 317 lb (143.8 kg) IBW/kg (Calculated) : 54.7  Temp (24hrs), Avg:98.8 F (37.1 C), Min:98.4 F (36.9 C), Max:99.1 F (37.3 C)  Recent Labs  Lab 11/17/17 1613  WBC 11.1*  CREATININE 1.82*    Estimated Creatinine Clearance: 42.8 mL/min (A) (by C-G formula based on SCr of 1.82 mg/dL (H)).    Allergies  Allergen Reactions  . Ace Inhibitors Anaphylaxis and Cough  . Penicillins Hives and Other (See Comments)    Tolerated ceftriaxone in 2019  Has patient had a PCN reaction causing immediate rash, facial/tongue/throat swelling, SOB or lightheadedness with hypotension: Yes Has patient had a PCN reaction causing severe rash involving mucus membranes or skin necrosis:  No Has patient had a PCN reaction that required hospitalization: No Has patient had a PCN reaction occurring within the last 10 years: No If all of the above answers are "NO", then may proceed with Cephalosporin use.  . Adhesive [Tape] Hives and Rash    Antimicrobials this admission: 5/29 levaquin >> x1 ED 5/30 cipro >>   Dose adjustments this admission:   Microbiology results:  BCx:   UCx:    Sputum:    MRSA PCR:  Thank you for allowing pharmacy to be a part of this patient's care.  Dorrene German 11/17/2017 10:47 PM

## 2017-11-17 NOTE — ED Notes (Signed)
Patient transported to CT 

## 2017-11-17 NOTE — ED Notes (Signed)
Patient has a small sore on sacrum that is vertical with sacrum that looks like the skin is irritated.

## 2017-11-18 ENCOUNTER — Observation Stay (HOSPITAL_COMMUNITY): Payer: Medicare Other

## 2017-11-18 ENCOUNTER — Encounter (HOSPITAL_COMMUNITY): Payer: Self-pay | Admitting: Internal Medicine

## 2017-11-18 ENCOUNTER — Other Ambulatory Visit: Payer: Self-pay | Admitting: *Deleted

## 2017-11-18 DIAGNOSIS — Z7951 Long term (current) use of inhaled steroids: Secondary | ICD-10-CM | POA: Diagnosis not present

## 2017-11-18 DIAGNOSIS — R310 Gross hematuria: Secondary | ICD-10-CM

## 2017-11-18 DIAGNOSIS — I129 Hypertensive chronic kidney disease with stage 1 through stage 4 chronic kidney disease, or unspecified chronic kidney disease: Secondary | ICD-10-CM | POA: Diagnosis present

## 2017-11-18 DIAGNOSIS — N390 Urinary tract infection, site not specified: Secondary | ICD-10-CM | POA: Diagnosis not present

## 2017-11-18 DIAGNOSIS — E1169 Type 2 diabetes mellitus with other specified complication: Secondary | ICD-10-CM | POA: Diagnosis not present

## 2017-11-18 DIAGNOSIS — R3129 Other microscopic hematuria: Secondary | ICD-10-CM | POA: Diagnosis not present

## 2017-11-18 DIAGNOSIS — D631 Anemia in chronic kidney disease: Secondary | ICD-10-CM | POA: Diagnosis present

## 2017-11-18 DIAGNOSIS — J449 Chronic obstructive pulmonary disease, unspecified: Secondary | ICD-10-CM | POA: Diagnosis present

## 2017-11-18 DIAGNOSIS — N179 Acute kidney failure, unspecified: Principal | ICD-10-CM

## 2017-11-18 DIAGNOSIS — G4733 Obstructive sleep apnea (adult) (pediatric): Secondary | ICD-10-CM | POA: Diagnosis not present

## 2017-11-18 DIAGNOSIS — Z79899 Other long term (current) drug therapy: Secondary | ICD-10-CM | POA: Diagnosis not present

## 2017-11-18 DIAGNOSIS — E1140 Type 2 diabetes mellitus with diabetic neuropathy, unspecified: Secondary | ICD-10-CM | POA: Diagnosis not present

## 2017-11-18 DIAGNOSIS — W19XXXD Unspecified fall, subsequent encounter: Secondary | ICD-10-CM | POA: Diagnosis not present

## 2017-11-18 DIAGNOSIS — Z7401 Bed confinement status: Secondary | ICD-10-CM | POA: Diagnosis not present

## 2017-11-18 DIAGNOSIS — R531 Weakness: Secondary | ICD-10-CM | POA: Diagnosis not present

## 2017-11-18 DIAGNOSIS — N183 Chronic kidney disease, stage 3 (moderate): Secondary | ICD-10-CM | POA: Diagnosis present

## 2017-11-18 DIAGNOSIS — E11649 Type 2 diabetes mellitus with hypoglycemia without coma: Secondary | ICD-10-CM | POA: Diagnosis not present

## 2017-11-18 DIAGNOSIS — R319 Hematuria, unspecified: Secondary | ICD-10-CM | POA: Diagnosis not present

## 2017-11-18 DIAGNOSIS — E669 Obesity, unspecified: Secondary | ICD-10-CM

## 2017-11-18 DIAGNOSIS — Z7952 Long term (current) use of systemic steroids: Secondary | ICD-10-CM | POA: Diagnosis not present

## 2017-11-18 DIAGNOSIS — E860 Dehydration: Secondary | ICD-10-CM | POA: Diagnosis present

## 2017-11-18 DIAGNOSIS — Z87891 Personal history of nicotine dependence: Secondary | ICD-10-CM | POA: Diagnosis not present

## 2017-11-18 DIAGNOSIS — M2550 Pain in unspecified joint: Secondary | ICD-10-CM | POA: Diagnosis not present

## 2017-11-18 DIAGNOSIS — Z96653 Presence of artificial knee joint, bilateral: Secondary | ICD-10-CM | POA: Diagnosis present

## 2017-11-18 DIAGNOSIS — M109 Gout, unspecified: Secondary | ICD-10-CM | POA: Diagnosis present

## 2017-11-18 DIAGNOSIS — I10 Essential (primary) hypertension: Secondary | ICD-10-CM | POA: Diagnosis not present

## 2017-11-18 DIAGNOSIS — Z981 Arthrodesis status: Secondary | ICD-10-CM | POA: Diagnosis not present

## 2017-11-18 DIAGNOSIS — Z6841 Body Mass Index (BMI) 40.0 and over, adult: Secondary | ICD-10-CM | POA: Diagnosis not present

## 2017-11-18 DIAGNOSIS — K219 Gastro-esophageal reflux disease without esophagitis: Secondary | ICD-10-CM | POA: Diagnosis present

## 2017-11-18 DIAGNOSIS — I959 Hypotension, unspecified: Secondary | ICD-10-CM | POA: Diagnosis present

## 2017-11-18 DIAGNOSIS — Z794 Long term (current) use of insulin: Secondary | ICD-10-CM

## 2017-11-18 DIAGNOSIS — N3941 Urge incontinence: Secondary | ICD-10-CM | POA: Diagnosis present

## 2017-11-18 DIAGNOSIS — G894 Chronic pain syndrome: Secondary | ICD-10-CM | POA: Diagnosis not present

## 2017-11-18 DIAGNOSIS — B9620 Unspecified Escherichia coli [E. coli] as the cause of diseases classified elsewhere: Secondary | ICD-10-CM | POA: Diagnosis present

## 2017-11-18 LAB — CBC
HCT: 35.3 % — ABNORMAL LOW (ref 36.0–46.0)
HEMATOCRIT: 37.1 % (ref 36.0–46.0)
HEMATOCRIT: 35.3 % — AB (ref 36.0–46.0)
HEMOGLOBIN: 11.9 g/dL — AB (ref 12.0–15.0)
HEMOGLOBIN: 11.1 g/dL — AB (ref 12.0–15.0)
Hemoglobin: 11.2 g/dL — ABNORMAL LOW (ref 12.0–15.0)
MCH: 29 pg (ref 26.0–34.0)
MCH: 28.8 pg (ref 26.0–34.0)
MCH: 29 pg (ref 26.0–34.0)
MCHC: 32.1 g/dL (ref 30.0–36.0)
MCHC: 31.4 g/dL (ref 30.0–36.0)
MCHC: 31.7 g/dL (ref 30.0–36.0)
MCV: 90.3 fL (ref 78.0–100.0)
MCV: 91.7 fL (ref 78.0–100.0)
MCV: 91.5 fL (ref 78.0–100.0)
PLATELETS: 241 10*3/uL (ref 150–400)
Platelets: 259 10*3/uL (ref 150–400)
Platelets: 237 10*3/uL (ref 150–400)
RBC: 4.11 MIL/uL (ref 3.87–5.11)
RBC: 3.85 MIL/uL — AB (ref 3.87–5.11)
RBC: 3.86 MIL/uL — AB (ref 3.87–5.11)
RDW: 15.7 % — ABNORMAL HIGH (ref 11.5–15.5)
RDW: 15.7 % — ABNORMAL HIGH (ref 11.5–15.5)
RDW: 15.7 % — ABNORMAL HIGH (ref 11.5–15.5)
WBC: 9.8 10*3/uL (ref 4.0–10.5)
WBC: 8.8 10*3/uL (ref 4.0–10.5)
WBC: 9.6 10*3/uL (ref 4.0–10.5)

## 2017-11-18 LAB — BASIC METABOLIC PANEL
ANION GAP: 12 (ref 5–15)
BUN: 44 mg/dL — ABNORMAL HIGH (ref 6–20)
CHLORIDE: 101 mmol/L (ref 101–111)
CO2: 24 mmol/L (ref 22–32)
CREATININE: 1.46 mg/dL — AB (ref 0.44–1.00)
Calcium: 9 mg/dL (ref 8.9–10.3)
GFR calc non Af Amer: 36 mL/min — ABNORMAL LOW (ref >60)
GFR, EST AFRICAN AMERICAN: 42 mL/min — AB (ref >60)
Glucose, Bld: 314 mg/dL — ABNORMAL HIGH (ref 65–99)
POTASSIUM: 4.8 mmol/L (ref 3.5–5.1)
SODIUM: 137 mmol/L (ref 135–145)

## 2017-11-18 LAB — HEPATIC FUNCTION PANEL
ALBUMIN: 2.7 g/dL — AB (ref 3.5–5.0)
ALT: 29 U/L (ref 14–54)
AST: 14 U/L — ABNORMAL LOW (ref 15–41)
Alkaline Phosphatase: 74 U/L (ref 38–126)
BILIRUBIN INDIRECT: 0.2 mg/dL — AB (ref 0.3–0.9)
Bilirubin, Direct: 0.1 mg/dL (ref 0.1–0.5)
TOTAL PROTEIN: 6.2 g/dL — AB (ref 6.5–8.1)
Total Bilirubin: 0.3 mg/dL (ref 0.3–1.2)

## 2017-11-18 LAB — HEMOGLOBIN A1C
HEMOGLOBIN A1C: 9.6 % — AB (ref 4.8–5.6)
MEAN PLASMA GLUCOSE: 228.82 mg/dL

## 2017-11-18 LAB — TSH: TSH: 1.322 u[IU]/mL (ref 0.350–4.500)

## 2017-11-18 LAB — GLUCOSE, CAPILLARY
GLUCOSE-CAPILLARY: 280 mg/dL — AB (ref 65–99)
Glucose-Capillary: 215 mg/dL — ABNORMAL HIGH (ref 65–99)
Glucose-Capillary: 273 mg/dL — ABNORMAL HIGH (ref 65–99)
Glucose-Capillary: 279 mg/dL — ABNORMAL HIGH (ref 65–99)

## 2017-11-18 LAB — CREATININE, SERUM
Creatinine, Ser: 1.63 mg/dL — ABNORMAL HIGH (ref 0.44–1.00)
GFR, EST AFRICAN AMERICAN: 37 mL/min — AB (ref >60)
GFR, EST NON AFRICAN AMERICAN: 32 mL/min — AB (ref >60)

## 2017-11-18 LAB — ABO/RH: ABO/RH(D): B POS

## 2017-11-18 LAB — TYPE AND SCREEN
ABO/RH(D): B POS
ANTIBODY SCREEN: NEGATIVE

## 2017-11-18 LAB — TROPONIN I
Troponin I: <0.03 ng/mL (ref <0.03)
Troponin I: <0.03 ng/mL (ref <0.03)

## 2017-11-18 LAB — CK: Total CK: 71 U/L (ref 38–234)

## 2017-11-18 LAB — HIV ANTIBODY (ROUTINE TESTING W REFLEX): HIV Screen 4th Generation wRfx: NONREACTIVE

## 2017-11-18 MED ORDER — FLUTICASONE PROPIONATE 50 MCG/ACT NA SUSP
2.0000 | Freq: Every day | NASAL | Status: DC
  Administered 2017-11-18 – 2017-11-22 (×5): 2 via NASAL
  Filled 2017-11-18: qty 16

## 2017-11-18 MED ORDER — PANTOPRAZOLE SODIUM 40 MG PO TBEC
40.0000 mg | DELAYED_RELEASE_TABLET | Freq: Two times a day (BID) | ORAL | Status: DC
  Administered 2017-11-18 – 2017-11-22 (×8): 40 mg via ORAL
  Filled 2017-11-18 (×7): qty 1

## 2017-11-18 MED ORDER — INSULIN NPH (HUMAN) (ISOPHANE) 100 UNIT/ML ~~LOC~~ SUSP
80.0000 [IU] | Freq: Every day | SUBCUTANEOUS | Status: DC
  Administered 2017-11-18 – 2017-11-19 (×2): 80 [IU] via SUBCUTANEOUS
  Filled 2017-11-18: qty 10

## 2017-11-18 MED ORDER — CIPROFLOXACIN HCL 500 MG PO TABS
500.0000 mg | ORAL_TABLET | Freq: Two times a day (BID) | ORAL | Status: DC
  Administered 2017-11-18 – 2017-11-22 (×9): 500 mg via ORAL
  Filled 2017-11-18 (×9): qty 1

## 2017-11-18 MED ORDER — SALINE SPRAY 0.65 % NA SOLN
1.0000 | NASAL | Status: DC | PRN
  Administered 2017-11-18 – 2017-11-21 (×3): 1 via NASAL
  Filled 2017-11-18 (×3): qty 44

## 2017-11-18 MED ORDER — HYDRALAZINE HCL 20 MG/ML IJ SOLN: 10.0000 mg | INTRAMUSCULAR | Status: DC | PRN

## 2017-11-18 NOTE — Evaluation (Signed)
Physical Therapy Evaluation Patient Details Name: Christy May MRN: [id number] DOB: 07/24/1950 Today's Date: 11/18/2017   History of Present Illness  67 y.o. female with history of diabetes mellitus type 2, hypertension, sleep apnea, gout, COPD on chronic steroids presents with generalized weakness with recent hx of recurrent UTIs  Clinical Impression  Pt admitted with above diagnosis. Pt currently with functional limitations due to the deficits listed below (see PT Problem List). Pt will benefit from skilled PT to increase their independence and safety with mobility to allow discharge to the venue listed below.  Pt reports she has mostly been using a w/c when home alone due to increased falls.  Pt has been working on ambulating with HHPT however reports her therapist recently needed surgery and no one else has been back since?  Pt reports she plans to d/c to SNF to regain more independence prior to d/c home alone.  She reports she is very determined to improve.     Follow Up Recommendations SNF    Equipment Recommendations  None recommended by PT    Recommendations for Other Services       Precautions / Restrictions Precautions Precautions: Fall      Mobility  Bed Mobility Overal bed mobility: Needs Assistance Bed Mobility: Supine to Sit     Supine to sit: Min guard;HOB elevated     General bed mobility comments: utilized HOB elevated and bed rails to self assist  Transfers Overall transfer level: Needs assistance Equipment used: Rolling walker (2 wheeled) Transfers: Sit to/from Stand Sit to Stand: Min assist         General transfer comment: assist to rise and steady, able to take a couple small effortful steps up [facility]  Ambulation/Gait                Stairs            Wheelchair Mobility    Modified Rankin (Stroke Patients Only)       Balance Overall balance assessment: Needs assistance;History of Falls         Standing balance  support: Bilateral upper extremity supported Standing balance-Leahy Scale: Poor                               Pertinent Vitals/Pain Pain Assessment: No/denies pain    Home Living   Living Arrangements: Alone Available Help at Discharge: Family;Friend(s);Available PRN/intermittently Type of Home: House Home Access: Stairs to enter   Entrance Stairs-Number of Steps: 1 Home Layout: One level Home Equipment: Hospital bed;Walker - 4 wheels;Bedside commode;Wheelchair - [employer] - 2 wheels Additional Comments: pt reports her RW and rollator slide away from her, more plastic pieces then rubber    Prior Function Level of Independence: Independent with assistive device(s)         Comments: mult hospitalizations since Nov 2018, but PT had started walking her at SNF (reports she hasn't really walked on her own since Nov), typically uses w/c at home for safety     Hand Dominance        Extremity/Trunk Assessment        Lower Extremity Assessment Lower Extremity Assessment: Generalized weakness       Communication   Communication: No difficulties  Cognition Arousal/Alertness: Awake/alert Behavior During Therapy: WFL for tasks assessed/performed Overall Cognitive Status: Within Functional Limits for tasks assessed  General Comments General comments (skin integrity, edema, etc.): Pt reports multiple falls at home so she uses a w/c for safety now    Exercises     Assessment/Plan    PT Assessment Patient needs continued PT services  PT Problem List Decreased strength;Decreased mobility;Decreased activity tolerance;Decreased balance;Obesity       PT Treatment Interventions Gait training;Therapeutic exercise;Patient/family education;Therapeutic activities;DME instruction;Stair training;Balance training;Functional mobility training    PT Goals (Current goals can be found in the Care Plan section)   Acute Rehab PT Goals PT Goal Formulation: With patient Time For Goal Achievement: 11/25/17 Potential to Achieve Goals: Good    Frequency Min 2X/week   Barriers to discharge        Co-evaluation               AM-PAC PT "6 Clicks" Daily Activity  Outcome Measure Difficulty turning over in bed (including adjusting bedclothes, sheets and blankets)?: A Lot Difficulty moving from lying on back to sitting on the side of the bed? : A Lot Difficulty sitting down on and standing up from a chair with arms (e.g., wheelchair, bedside commode, etc,.)?: Unable Help needed moving to and from a bed to chair (including a wheelchair)?: A Little Help needed walking in hospital room?: A Lot Help needed climbing 3-5 steps with a railing? : Total 6 Click Score: 11    End of Session   Activity Tolerance: Patient tolerated treatment well Patient left: in bed;with bed alarm set;with call bell/phone within reach;with nursing/sitter in room   PT Visit Diagnosis: Difficulty in walking, not elsewhere classified (R26.2);History of falling (Z91.81)    Time: 4098-1191 PT Time Calculation (min) (ACUTE ONLY): 19 min   Charges:   PT Evaluation $PT Eval Low Complexity: 1 Low     PT G Codes:        Carmelia Bake, PT, DPT 11/18/2017 Pager: [phone]  York Ram E 11/18/2017, 12:57 PM

## 2017-11-18 NOTE — Progress Notes (Addendum)
PROGRESS NOTE    Christy May   QQV:[id number]  DOB: 1950-07-02  DOA: 11/17/2017 PCP: Lucianne Lei, MD   Brief Narrative:  Christy May 67 y.o. female with history of diabetes mellitus type 2, hypertension, sleep apnea, gout, COPD on chronic steroids presents to the ER because of increasing weakness over the last 3 days.  Patient states he had at least 2 falls over the last 3 days because she felt weak on standing up and felt dizzy.  Unable to ambulate because of the weakness.  3 days ago she states that she was walking to the kitchen when she suddenly lost consciousness.  She was admitted from 2/21 through 2/26 for e coli UTI and sepsis, possibly adrenal insufficiency, AKI and discharged to SNF.  She was admitted from 3/11 (from SNF) for lethargy, hypotension and septic shock from "UTI vs GI source". UA was negative and u culture was not sent. C diff antigen was + and toxin negative. She was sent to SNF with Keflex. Flexeril, Norco and Tramadol were discontinued.   In ED: UA shows large EBC, rare bacteria, neg nitrites- Levaquin started. 1 L NS bolus given and started on NS infusion. Started on [employer].   Subjective: She states that for 1 wk now she has felt that she has a UTI, she is voiding a lot (incontinent), urine is dark and seems bloody. A UA and culture was done by [facility] and is was negative for a UTI.  She also has been having black BMs for a few days. No vomiting or epigastric pain. She is on chronic prednisone for chronic bronchitis per Dr Lamonte Sakai.     Assessment & Plan:   Principal Problem:   Acute renal failure- dehydration/ hypotension/ possibly syncope and falls - cont IVF- suspect some of this dehydration is due to poorly controlled DM  Active Problems:   Acute lower UTI? Hematuria - has dysuria, and increased frequency - renal ultrasound negative - cont antibiotics- unfortunately culture not sent- repeat UA in 1-2 days  Diabetes mellitus type 2 in  obese - uncontrolled - takes NPH 120 U in AM but not in PM and no short acting insulin (per Dr Loanne Drilling, endo) - start NPH 80 U at bedtime - cont SSI in hospital    Morbid obesity -Body mass index is 53.73 kg/m.  Upper airway cough syndrome - cont Solu cortef- patient states she is on chronic 20 mg of Prednisone for lung issues per Dr Lamonte Sakai- I have found a note from 03/15/17 which shows she has upper airway cough syndrome which is prednisone dependant  - cont Singulair as well  Black stools? - check stool occult    Sleep apnea - CPAP  Essential HTN - Metoprolol     DM neuropathy, type II diabetes mellitus - Gabapentin, Hydrocodone  Urge incontinence - on Ditropan- continue   DVT prophylaxis: Lovenox Code Status: Full code Family Communication:  Disposition Plan: to be determined- cont to hydrate and follow renal function Consultants:    Procedures:    Antimicrobials:  Anti-infectives (From admission, onward)   Start     Dose/Rate Route Frequency Ordered Stop   11/18/17 1000  ciprofloxacin (CIPRO) IVPB 400 mg  Status:  Discontinued     400 mg 200 mL/hr over 60 Minutes Intravenous Every 12 hours 11/17/17 2247 11/18/17 0803   11/18/17 1000  ciprofloxacin (CIPRO) tablet 500 mg     500 mg Oral 2 times daily 11/18/17 0803     11/17/17  2000  levofloxacin (LEVAQUIN) IVPB 500 mg     500 mg 100 mL/hr over 60 Minutes Intravenous  Once 11/17/17 1949 11/17/17 2236       Objective: Vitals:   11/18/17 0258 11/18/17 0502 11/18/17 0952 11/18/17 1403  BP:  (!) 130/56 (!) 148/72 136/67  Pulse:  82 90 96  Resp:  18  18  Temp:  97.7 F (36.5 C)  98 F (36.7 C)  TempSrc:  Oral  Oral  SpO2:  98%  98%  Weight: (!) 142 kg (313 lb)     Height: 5\' 4"  (1.626 m)       Intake/Output Summary (Last 24 hours) at 11/18/2017 1431 Last data filed at 11/18/2017 1402 Gross per 24 hour  Intake 2043.33 ml  Output 2000 ml  Net 43.33 ml   Filed Weights   11/17/17 1933 11/18/17 0258   Weight: (!) 143.8 kg (317 lb) (!) 142 kg (313 lb)    Examination: General exam: Appears comfortable  HEENT: PERRLA, oral mucosa moist, no sclera icterus or thrush Respiratory system: Clear to auscultation. Respiratory effort normal. Cardiovascular system: S1 & S2 heard, RRR.   Gastrointestinal system: Abdomen soft, non-tender, nondistended. Normal bowel sound. No organomegaly Central nervous system: Alert and oriented. No focal neurological deficits. Extremities: No cyanosis, clubbing or edema Skin: No rashes or ulcers Psychiatry:  Mood & affect appropriate.     Data Reviewed: I have personally reviewed following labs and imaging studies  CBC: Recent Labs  Lab 11/17/17 1613 11/17/17 2243 11/18/17 0210 11/18/17 0419 11/18/17 0757  WBC 11.1* 8.8 9.8 8.8 9.6  NEUTROABS 9.1*  --   --   --   --   HGB 13.2 11.7* 11.9* 11.1* 11.2*  HCT 41.2 37.0 37.1 35.3* 35.3*  MCV 91.2 91.8 90.3 91.7 91.5  PLT 274 227 259 241 409   Basic Metabolic Panel: Recent Labs  Lab 11/17/17 1613 11/17/17 2243 11/18/17 0419  NA 140  --  137  K 4.7  --  4.8  CL 100*  --  101  CO2 26  --  24  GLUCOSE 74  --  314*  BUN 52*  --  44*  CREATININE 1.82* 1.63* 1.46*  CALCIUM 10.0  --  9.0   GFR: Estimated Creatinine Clearance: 52.9 mL/min (A) (by C-G formula based on SCr of 1.46 mg/dL (H)). Liver Function Tests: Recent Labs  Lab 11/17/17 1613 11/18/17 0419  AST 21 14*  ALT 35 29  ALKPHOS 83 74  BILITOT 0.4 0.3  PROT 7.7 6.2*  ALBUMIN 3.4* 2.7*   No results for input(s): LIPASE, AMYLASE in the last 168 hours. No results for input(s): AMMONIA in the last 168 hours. Coagulation Profile: No results for input(s): INR, PROTIME in the last 168 hours. Cardiac Enzymes: Recent Labs  Lab 11/17/17 2243 11/18/17 0419 11/18/17 1217  CKTOTAL 71  --   --   TROPONINI <0.03 <0.03 <0.03   BNP (last 3 results) No results for input(s): PROBNP in the last 8760 hours. HbA1C: Recent Labs     11/18/17 1217  HGBA1C 9.6*   CBG: Recent Labs  Lab 11/17/17 1609 11/18/17 0739 11/18/17 1221  GLUCAP 65 279* 280*   Lipid Profile: No results for input(s): CHOL, HDL, LDLCALC, TRIG, CHOLHDL, LDLDIRECT in the last 72 hours. Thyroid Function Tests: Recent Labs    11/17/17 2243  TSH 1.322   Anemia Panel: No results for input(s): VITAMINB12, FOLATE, FERRITIN, TIBC, IRON, RETICCTPCT in the last 72 hours. Urine  analysis:    Component Value Date/Time   COLORURINE AMBER (A) 11/17/2017 1825   APPEARANCEUR CLOUDY (A) 11/17/2017 1825   LABSPEC 1.014 11/17/2017 1825   PHURINE 9.0 (H) 11/17/2017 1825   GLUCOSEU NEGATIVE 11/17/2017 1825   GLUCOSEU NEGATIVE 12/14/2011 1707   HGBUR LARGE (A) 11/17/2017 1825   BILIRUBINUR NEGATIVE 11/17/2017 1825   BILIRUBINUR n 11/16/2014 1515   KETONESUR NEGATIVE 11/17/2017 1825   PROTEINUR 100 (A) 11/17/2017 1825   UROBILINOGEN 0.2 02/27/2015 2226   NITRITE NEGATIVE 11/17/2017 1825   LEUKOCYTESUR LARGE (A) 11/17/2017 1825   Sepsis Labs: @LABRCNTIP (procalcitonin:4,lacticidven:4) )No results found for this or any previous visit (from the past 240 hour(s)).       Radiology Studies: Dg Chest 2 View  Result Date: 11/17/2017 CLINICAL DATA:  Weakness for 2 days. EXAM: CHEST - 2 VIEW COMPARISON:  May 02, 2018 FINDINGS: Stable cardiomegaly. The hila and mediastinum are stable. No pulmonary nodules or masses. Mild atelectasis in the bases. No focal infiltrate. IMPRESSION: No active cardiopulmonary disease. Electronically Signed   By: Dorise Bullion III M.D   On: 11/17/2017 16:38   Dg Pelvis 1-2 Views  Result Date: 11/17/2017 CLINICAL DATA:  Status post fall, pain EXAM: PELVIS - 1-2 VIEW COMPARISON:  None. FINDINGS: There is no evidence of pelvic fracture or diastasis. No pelvic bone lesions are seen. IMPRESSION: Negative. Electronically Signed   By: Kathreen Devoid   On: 11/17/2017 21:10   Dg Shoulder Right  Result Date: 11/17/2017 CLINICAL  DATA:  Patient has had weakness x2 days. A lot of falls recently. Patient just got out of a rehab facility for weakness and just got a wheelchair for home. PT states she has generalized right shoulder pain and bottom pain. EXAM: RIGHT SHOULDER - 2+ VIEW COMPARISON:  None. FINDINGS: There is no fracture or dislocation. There are moderate degenerative changes of the acromioclavicular joint. IMPRESSION: No acute injury of the shoulder. Electronically Signed   By: Kathreen Devoid   On: 11/17/2017 21:45   Ct Head Wo Contrast  Result Date: 11/17/2017 CLINICAL DATA:  Weakness x2 days, UTI EXAM: CT HEAD WITHOUT CONTRAST TECHNIQUE: Contiguous axial images were obtained from the base of the skull through the vertex without intravenous contrast. COMPARISON:  08/30/2017 FINDINGS: Brain: No evidence of acute infarction, hemorrhage, hydrocephalus, extra-axial collection or mass lesion/mass effect. Subcortical white matter and periventricular small vessel ischemic changes. Vascular: Mild intracranial atherosclerosis. Skull: Normal. Negative for fracture or focal lesion. Sinuses/Orbits: The visualized paranasal sinuses are essentially clear. The mastoid air cells are unopacified. Other: None. IMPRESSION: No evidence of acute intracranial abnormality. Mild small vessel ischemic changes. Electronically Signed   By: Julian Hy M.D.   On: 11/17/2017 20:46   US Renal  Result Date: 11/18/2017 CLINICAL DATA:  Hematuria. EXAM: RENAL / URINARY TRACT ULTRASOUND COMPLETE COMPARISON:  CT, 08/30/2017 FINDINGS: Right Kidney: Length: 11.8 cm. Echogenicity within normal limits. No mass or hydronephrosis visualized. Left Kidney: Length: 11.1 cm. Echogenicity within normal limits. No mass or hydronephrosis visualized. Bladder: Appears normal for degree of bladder distention. IMPRESSION: Normal renal ultrasound. Electronically Signed   By: Lajean Manes M.D.   On: 11/18/2017 14:28      Scheduled Meds: . allopurinol  300 mg Oral  Daily  . aspirin EC  81 mg Oral QHS  . ciprofloxacin  500 mg Oral BID  . cycloSPORINE  1 drop Both Eyes BID  . enoxaparin (LOVENOX) injection  70 mg Subcutaneous QHS  . fluticasone  2 spray  Each Nare Daily  . folic acid  1 mg Oral Daily  . gabapentin  400 mg Oral TID  . hydrocortisone sod succinate (SOLU-CORTEF) inj  50 mg Intravenous Q8H  . insulin aspart  0-9 Units Subcutaneous TID WC  . insulin NPH Human  120 Units Subcutaneous QAC breakfast  . insulin NPH Human  80 Units Subcutaneous QHS  . metoprolol tartrate  75 mg Oral BID  . montelukast  10 mg Oral QHS  . multivitamin with minerals  1 tablet Oral Daily  . oxybutynin  10 mg Oral Daily  . pantoprazole  40 mg Oral BID AC   Continuous Infusions: . sodium chloride 100 mL/hr at 11/18/17 0957     LOS: 0 days    Time spent in minutes: 45 min taken to review notes and speak with patient and RN    Debbe Odea, MD [facility] Pager: [email or web address] Password [facility] 11/18/2017, 2:31 PM

## 2017-11-18 NOTE — Patient Outreach (Signed)
[facility]) Care Management  11/18/2017  SHEROL SABAS [birth year] [id number]   Noted that member was readmitted to hospital with UTI, weakness, and multiple falls.  Hospital liaisons notified of admission.  Advised that member may need increased support at discharge.  Will follow up after discharge.  [name], [place], MSN [place] [phone]

## 2017-11-18 NOTE — Progress Notes (Signed)
Pt. set up with CPAP for h/s, humidifier filled with S/W, L/FFM used, pt. Currently on room air, tolerating well, made aware to notify if needed.

## 2017-11-18 NOTE — Clinical Social Work Note (Signed)
Clinical Social Work Assessment  Patient Details  Name: Christy May MRN: [id number] Date of Birth: [birth year]  Date of referral:  11/18/17               Reason for consult:  Facility Placement                Permission sought to share information with:  Case Manager Permission granted to share information::     Name::        Agency::     Relationship::     Contact Information:     Housing/Transportation Living arrangements for the past 2 months:  Single Family Home Source of Information:  Patient, Medical Team Patient Interpreter Needed:  None Criminal Activity/Legal Involvement Pertinent to Current Situation/Hospitalization:  No - Comment as needed Significant Relationships:  Other Family Members, Adult Children, Community Support Lives with:  Self Do you feel safe going back to the place where you live?  Yes Need for family participation in patient care:  No (Coment)  Care giving concerns:  Pt admitted from home where she resides alone. Has had several recent hospital and SNF admissions she reports, and currently has Home health PT/RN/aide. Fmaily nearby but not available to assist often. Currently admitted under observation for acute renal failure, question of UTI.   Social Worker assessment / plan:  CSW consulted to assist with disposition as SNF recommended. Met with pt at bedside- she was alert,oriented. Circumstantial at times however appropriate and redirectable.  Pt's last SNF admission was to [facility] less than 30 days ago. CSW referred again however was informed pt completely out of Medicare SNF days (including co-pay days).  Pt states that she is planning to return home- states that she is supposed to be getting a care aide through a [facility] "once she is through with home health."  Pt is followed by [facility]. Considering referral to [facility] as pt's needs qualify for SNF but she has no Medicare coverage and unable to private pay. CM following as well.    Plan: TBD- pt planning to return home with home health. Potential referral to [facility]  Employment status:  Retired Forensic scientist:  Medicare PT Recommendations:  [place] / Referral to community resources:     Patient/Family's Response to care:  Pt appreciative  Patient/Family's Understanding of and Emotional Response to Diagnosis, Current Treatment, and Prognosis:  Pt understanding of treatment but seems anxious and states that "she prefers to come to the ED when she is not feeling well." States she does have PCP but "let's it go on too far because I don't know what is wrong." Processed this with pt and encouraged her to utilize the resources available to her.   Emotional Assessment Appearance:  Appears stated age Attitude/Demeanor/Rapport:  Engaged Affect (typically observed):  Anxious Orientation:  Oriented to Self, Oriented to Place, Oriented to  Time, Oriented to Situation Alcohol / Substance use:  Not Applicable Psych involvement (Current and /or in the community):  No (Comment)  Discharge Needs  Concerns to be addressed:  Discharge Planning Concerns Readmission within the last 30 days:  No Current discharge risk:  Dependent with Mobility, Lives alone Barriers to Discharge:  Continued Medical Work up   [employer], LCSW 11/18/2017, 4:23 PM  [phone]

## 2017-11-18 NOTE — Consult Note (Signed)
   [facility] CM Inpatient Consult   11/18/2017  Christy May [birth year] [id number]   Patient active with [facility] program. Made aware of hospitalization by [place].  SNF is recommended by patient does not have any SNF days available and she is observation. Confirmed with inpatient LCSW.   Spoke with patient at bedside to discuss above notes. Patient endorses she has [facility] for PT/OT/RN/aide services. States PCS services has not started yet. States she does not have Medicaid.   Discussed [facility] program. She is interested.   Discussed above with inpatient RNCM.   Will continue to follow and update [place] accordingly.   Marthenia Rolling, MSN-Ed, RN,BSN [facility] Liaison [phone]

## 2017-11-18 NOTE — Progress Notes (Signed)
Inpatient Diabetes Program Recommendations  AACE/ADA: New Consensus Statement on Inpatient Glycemic Control (2015)  Target Ranges:  Prepandial:   less than 140 mg/dL      Peak postprandial:   less than 180 mg/dL (1-2 hours)      Critically ill patients:  140 - 180 mg/dL   Lab Results  Component Value Date   GLUCAP 273 (H) 11/18/2017   HGBA1C 9.6 (H) 11/18/2017    Review of Glycemic Control  Diabetes history: DM2 Outpatient Diabetes medications: NPH 120 units QAM Current orders for Inpatient glycemic control: NPH 120 units QAM, 80 units QHS, Novolog 0-9 units tidwc  HgbA1C - 9.6% Followed by [facility]. Endo - [name]  Pt states she has hard time controlling blood sugars at home. Has hypoglycemia several times/week. Also has blood sugars > 300 mg/dL. States she needs affordable insulin and this is reason for being on NPH.  Inpatient Diabetes Program Recommendations:     Decrease NPH to 40 units QHS Add Novolog 4 units tidwc for meal coverage insulin.  Recommend pt go home on NPH and Novolin R insulin. Needs basal/bolus.  Discussed with RN. Concern for hypoglycemia with 80 more units of NPH tonight. Paged MD regarding above.   Thank you. Lorenda Peck, RD, LDN, CDE Inpatient Diabetes Coordinator [phone]

## 2017-11-18 NOTE — Progress Notes (Signed)
PHARMACIST - PHYSICIAN COMMUNICATION  CONCERNING: Antibiotic IV to Oral Route Change Policy  RECOMMENDATION: This patient is receiving cipro by the intravenous route.  Based on criteria approved by the Pharmacy and Therapeutics Committee, the antibiotic(s) is/are being converted to the equivalent oral dose form(s).   DESCRIPTION: These criteria include:  Patient being treated for a respiratory tract infection, urinary tract infection, cellulitis or clostridium difficile associated diarrhea if on metronidazole  The patient is not neutropenic and does not exhibit a GI malabsorption state  The patient is eating (either orally or via tube) and/or has been taking other orally administered medications for a least 24 hours  The patient is improving clinically and has a Tmax < 100.5  If you have questions about this conversion, please contact the Pharmacy Department  []   [phone] )  [facility] []   [phone] )  [facility] []   [phone] )  [facility] []   [phone] )  [facility] [x]   [phone] )  [place], [place].D. [phone] 11/18/2017 8:02 AM

## 2017-11-19 DIAGNOSIS — R3129 Other microscopic hematuria: Secondary | ICD-10-CM

## 2017-11-19 DIAGNOSIS — R531 Weakness: Secondary | ICD-10-CM

## 2017-11-19 DIAGNOSIS — W19XXXD Unspecified fall, subsequent encounter: Secondary | ICD-10-CM

## 2017-11-19 DIAGNOSIS — I10 Essential (primary) hypertension: Secondary | ICD-10-CM

## 2017-11-19 LAB — BASIC METABOLIC PANEL
Anion gap: 8 (ref 5–15)
BUN: 27 mg/dL — ABNORMAL HIGH (ref 6–20)
CALCIUM: 9 mg/dL (ref 8.9–10.3)
CO2: 27 mmol/L (ref 22–32)
CREATININE: 1.19 mg/dL — AB (ref 0.44–1.00)
Chloride: 106 mmol/L (ref 101–111)
GFR calc non Af Amer: 46 mL/min — ABNORMAL LOW (ref >60)
GFR, EST AFRICAN AMERICAN: 54 mL/min — AB (ref >60)
Glucose, Bld: 242 mg/dL — ABNORMAL HIGH (ref 65–99)
Potassium: 4.5 mmol/L (ref 3.5–5.1)
Sodium: 141 mmol/L (ref 135–145)

## 2017-11-19 LAB — CBC
HEMATOCRIT: 34.6 % — AB (ref 36.0–46.0)
Hemoglobin: 10.8 g/dL — ABNORMAL LOW (ref 12.0–15.0)
MCH: 28.4 pg (ref 26.0–34.0)
MCHC: 31.2 g/dL (ref 30.0–36.0)
MCV: 91.1 fL (ref 78.0–100.0)
PLATELETS: 210 10*3/uL (ref 150–400)
RBC: 3.8 MIL/uL — ABNORMAL LOW (ref 3.87–5.11)
RDW: 15.7 % — AB (ref 11.5–15.5)
WBC: 9.3 10*3/uL (ref 4.0–10.5)

## 2017-11-19 LAB — GLUCOSE, CAPILLARY
GLUCOSE-CAPILLARY: 234 mg/dL — AB (ref 65–99)
GLUCOSE-CAPILLARY: 295 mg/dL — AB (ref 65–99)
Glucose-Capillary: 294 mg/dL — ABNORMAL HIGH (ref 65–99)
Glucose-Capillary: 305 mg/dL — ABNORMAL HIGH (ref 65–99)

## 2017-11-19 MED ORDER — POLYETHYLENE GLYCOL 3350 17 G PO PACK
17.0000 g | PACK | Freq: Once | ORAL | Status: AC
  Administered 2017-11-19: 17 g via ORAL
  Filled 2017-11-19: qty 1

## 2017-11-19 MED ORDER — SODIUM CHLORIDE 0.9 % IV BOLUS
500.0000 mL | Freq: Once | INTRAVENOUS | Status: AC
  Administered 2017-11-19: 500 mL via INTRAVENOUS

## 2017-11-19 MED ORDER — SODIUM CHLORIDE 0.9 % IV SOLN
INTRAVENOUS | Status: DC
  Administered 2017-11-19 (×2): via INTRAVENOUS

## 2017-11-19 MED ORDER — PREDNISONE 20 MG PO TABS
20.0000 mg | ORAL_TABLET | Freq: Every day | ORAL | Status: DC
  Administered 2017-11-19 – 2017-11-22 (×4): 20 mg via ORAL
  Filled 2017-11-19 (×5): qty 1

## 2017-11-19 MED ORDER — METFORMIN HCL 500 MG PO TABS
500.0000 mg | ORAL_TABLET | Freq: Two times a day (BID) | ORAL | Status: DC
  Administered 2017-11-19 – 2017-11-22 (×7): 500 mg via ORAL
  Filled 2017-11-19 (×7): qty 1

## 2017-11-19 NOTE — Care Management Note (Signed)
Case Management Note  Patient Details  Name: Christy May MRN: [id number] Date of Birth: Sep 20, 1950  Subjective/Objective: Pt admitted with UTI, Acute renal failure,                    Action/Plan: Plan to discharge home with [facility].    Expected Discharge Date:  (unknown)               Expected Discharge Plan:  [place]  In-House Referral:     Discharge planning Services  CM Consult  Post Acute Care Choice:    Choice offered to:  Patient  DME Arranged:    DME Agency:     HH Arranged:  RN, PT, Nurse's Aide [place] Agency:  [facility]  Status of Service:  Completed, signed off  If discussed at [place] of Stay Meetings, dates discussed:    Additional CommentsPurcell Mouton, RN 11/19/2017, 12:34 PM

## 2017-11-19 NOTE — Progress Notes (Signed)
Call to [facility] was made. Update given to in house rep.  Will need HH orders please.

## 2017-11-19 NOTE — Progress Notes (Signed)
Results for Christy May, Christy May (MRN [id number]) as of 11/19/2017 12:39  Ref. Range 11/18/2017 12:21 11/18/2017 17:12 11/18/2017 23:33 11/19/2017 07:53 11/19/2017 11:38  Glucose-Capillary Latest Ref Range: 65 - 99 mg/dL 280 (H) 273 (H) 215 (H) 234 (H) 294 (H)  Noted that blood sugars continue to be greater than 200 mg/dl.  Recommend increasing Novolog correction scale to MODERATE TID & HS if blood sugars continue to be elevated.   Harvel Ricks RN BSN CDE Diabetes Coordinator Pager: [phone]  8am-5pm

## 2017-11-19 NOTE — Progress Notes (Signed)
PROGRESS NOTE    TESSY PAWELSKI   XIP:[id number]  DOB: 1951-01-16  DOA: 11/17/2017 PCP: Lucianne Lei, MD   Brief Narrative:  [name] 67 y.o. female with history of diabetes mellitus type 2, hypertension, sleep apnea, gout, COPD on chronic steroids presents to the ER because of increasing weakness over the last 3 days.  Patient states he had at least 2 falls over the last 3 days because she felt weak on standing up and felt dizzy.  Unable to ambulate because of the weakness.  3 days ago she states that she was walking to the kitchen when she suddenly lost consciousness.  She was admitted from 2/21 through 2/26 for e coli UTI and sepsis, possibly adrenal insufficiency, AKI and discharged to SNF.  She was admitted from 3/11 (from SNF) for lethargy, hypotension and septic shock from "UTI vs GI source". UA was negative and u culture was not sent. C diff antigen was + and toxin negative. She was sent to SNF with Keflex. Flexeril, Norco and Tramadol were discontinued.   In ED: UA shows large EBC, rare bacteria, neg nitrites- Levaquin started. 1 L NS bolus given and started on NS infusion. Started on [employer].   Subjective: No BM in the hospital. She felt her sugar was low this morning around 8 and so she ate an orange. Sugars were checked earlier around 5 and 7:30 and were actually in the 200s. Advised her to talk to RN and have glucose checked if she feels hypoglycemic.    Assessment & Plan:   Principal Problem:   Acute renal failure- dehydration/ hypotension/ possibly syncope and falls  - suspect some of this dehydration is due to poorly controlled DM - Cr is improving- continuous IVF expired last night- I have given her a 500 cc bolus and will resume NS at 75 cc/hr  Active Problems:   Acute lower UTI? Hematuria - has dysuria, and increased frequency - renal ultrasound negative for cause of hematuria and therefore I may be due to a UTI -- cont antibiotics for UTI-  unfortunately culture not sent- repeat UA tomorrow  Diabetes mellitus type 2 in obese - uncontrolled - takes NPH 120 U in AM but not in PM and no short acting insulin (per Dr Loanne Drilling, endo) - started NPH 80 U at bedtime on 5/30 - cont SSI in hospital  - add Metformin today and cont to follow sugars    Morbid obesity -Body mass index is 53.62 kg/m.  Upper airway cough syndrome - cont Solu cortef- patient states she is on chronic 20 mg of Prednisone for lung issues per Dr Lamonte Sakai- I have found a note from 03/15/17 which shows she has upper airway cough syndrome which is prednisone dependant  - cont Singulair as well  Black stools? - check stool occult- she has not had a BM yet  Anemia - Hb 11.1>> 10.8 with IV Hydration    Sleep apnea - CPAP  Essential HTN - Metoprolol     DM neuropathy, type II diabetes mellitus - Gabapentin, Hydrocodone  Urge incontinence - on Ditropan- continue  Mostly Wheelchair bound - was working with PT at home and was able to walk with help from the PT and a walker but since her PT has been away for the past 1-2 wks, she is only pivoting from bed to chair   DVT prophylaxis: Lovenox Code Status: Full code Family Communication:  Disposition Plan: to be determined- cont to hydrate and follow renal function  and sugars- PT eval Consultants:    Procedures:    Antimicrobials:  Anti-infectives (From admission, onward)   Start     Dose/Rate Route Frequency Ordered Stop   11/18/17 1000  ciprofloxacin (CIPRO) IVPB 400 mg  Status:  Discontinued     400 mg 200 mL/hr over 60 Minutes Intravenous Every 12 hours 11/17/17 2247 11/18/17 0803   11/18/17 1000  ciprofloxacin (CIPRO) tablet 500 mg     500 mg Oral 2 times daily 11/18/17 0803     11/17/17 2000  levofloxacin (LEVAQUIN) IVPB 500 mg     500 mg 100 mL/hr over 60 Minutes Intravenous  Once 11/17/17 1949 11/17/17 2236       Objective: Vitals:   11/18/17 2341 11/19/17 0542 11/19/17 0542 11/19/17  1351  BP: 134/75  (!) 153/78 (!) 152/73  Pulse: 79  86 64  Resp: 14  16 18   Temp: 98.4 F (36.9 C)  97.6 F (36.4 C) 97.9 F (36.6 C)  TempSrc: Oral  Oral Oral  SpO2: 100%  96% 99%  Weight:  (!) 141.7 kg (312 lb 6.3 oz)    Height:        Intake/Output Summary (Last 24 hours) at 11/19/2017 1416 Last data filed at 11/19/2017 1353 Gross per 24 hour  Intake 2153.33 ml  Output 2500 ml  Net -346.67 ml   Filed Weights   11/17/17 1933 11/18/17 0258 11/19/17 0542  Weight: (!) 143.8 kg (317 lb) (!) 142 kg (313 lb) (!) 141.7 kg (312 lb 6.3 oz)    Examination: General exam: Appears comfortable  HEENT: PERRLA, oral mucosa moist, no sclera icterus or thrush Respiratory system: Clear to auscultation. Respiratory effort normal. Cardiovascular system: S1 & S2 heard, RRR.   Gastrointestinal system: morbidly obese- Abdomen soft, non-tender, nondistended. Normal bowel sound. No organomegaly Central nervous system: Alert and oriented. No focal neurological deficits. Extremities: No cyanosis, clubbing or edema Skin: No rashes or ulcers Psychiatry:  Mood & affect appropriate.     Data Reviewed: I have personally reviewed following labs and imaging studies  CBC: Recent Labs  Lab 11/17/17 1613 11/17/17 2243 11/18/17 0210 11/18/17 0419 11/18/17 0757 11/19/17 0429  WBC 11.1* 8.8 9.8 8.8 9.6 9.3  NEUTROABS 9.1*  --   --   --   --   --   HGB 13.2 11.7* 11.9* 11.1* 11.2* 10.8*  HCT 41.2 37.0 37.1 35.3* 35.3* 34.6*  MCV 91.2 91.8 90.3 91.7 91.5 91.1  PLT 274 227 259 241 237 937   Basic Metabolic Panel: Recent Labs  Lab 11/17/17 1613 11/17/17 2243 11/18/17 0419 11/19/17 0429  NA 140  --  137 141  K 4.7  --  4.8 4.5  CL 100*  --  101 106  CO2 26  --  24 27  GLUCOSE 74  --  314* 242*  BUN 52*  --  44* 27*  CREATININE 1.82* 1.63* 1.46* 1.19*  CALCIUM 10.0  --  9.0 9.0   GFR: Estimated Creatinine Clearance: 64.8 mL/min (A) (by C-G formula based on SCr of 1.19 mg/dL (H)). Liver  Function Tests: Recent Labs  Lab 11/17/17 1613 11/18/17 0419  AST 21 14*  ALT 35 29  ALKPHOS 83 74  BILITOT 0.4 0.3  PROT 7.7 6.2*  ALBUMIN 3.4* 2.7*   No results for input(s): LIPASE, AMYLASE in the last 168 hours. No results for input(s): AMMONIA in the last 168 hours. Coagulation Profile: No results for input(s): INR, PROTIME in the last 168 hours.  Cardiac Enzymes: Recent Labs  Lab 11/17/17 2243 11/18/17 0419 11/18/17 1217  CKTOTAL 71  --   --   TROPONINI <0.03 <0.03 <0.03   BNP (last 3 results) No results for input(s): PROBNP in the last 8760 hours. HbA1C: Recent Labs    11/18/17 1217  HGBA1C 9.6*   CBG: Recent Labs  Lab 11/18/17 1221 11/18/17 1712 11/18/17 2333 11/19/17 0753 11/19/17 1138  GLUCAP 280* 273* 215* 234* 294*   Lipid Profile: No results for input(s): CHOL, HDL, LDLCALC, TRIG, CHOLHDL, LDLDIRECT in the last 72 hours. Thyroid Function Tests: Recent Labs    11/17/17 2243  TSH 1.322   Anemia Panel: No results for input(s): VITAMINB12, FOLATE, FERRITIN, TIBC, IRON, RETICCTPCT in the last 72 hours. Urine analysis:    Component Value Date/Time   COLORURINE AMBER (A) 11/17/2017 1825   APPEARANCEUR CLOUDY (A) 11/17/2017 1825   LABSPEC 1.014 11/17/2017 1825   PHURINE 9.0 (H) 11/17/2017 1825   GLUCOSEU NEGATIVE 11/17/2017 1825   GLUCOSEU NEGATIVE 12/14/2011 1707   HGBUR LARGE (A) 11/17/2017 1825   BILIRUBINUR NEGATIVE 11/17/2017 1825   BILIRUBINUR n 11/16/2014 1515   KETONESUR NEGATIVE 11/17/2017 1825   PROTEINUR 100 (A) 11/17/2017 1825   UROBILINOGEN 0.2 02/27/2015 2226   NITRITE NEGATIVE 11/17/2017 1825   LEUKOCYTESUR LARGE (A) 11/17/2017 1825   Sepsis Labs: @LABRCNTIP (procalcitonin:4,lacticidven:4) )No results found for this or any previous visit (from the past 240 hour(s)).       Radiology Studies: Dg Chest 2 View  Result Date: 11/17/2017 CLINICAL DATA:  Weakness for 2 days. EXAM: CHEST - 2 VIEW COMPARISON:  May 02, 2018  FINDINGS: Stable cardiomegaly. The hila and mediastinum are stable. No pulmonary nodules or masses. Mild atelectasis in the bases. No focal infiltrate. IMPRESSION: No active cardiopulmonary disease. Electronically Signed   By: Dorise Bullion III M.D   On: 11/17/2017 16:38   Dg Pelvis 1-2 Views  Result Date: 11/17/2017 CLINICAL DATA:  Status post fall, pain EXAM: PELVIS - 1-2 VIEW COMPARISON:  None. FINDINGS: There is no evidence of pelvic fracture or diastasis. No pelvic bone lesions are seen. IMPRESSION: Negative. Electronically Signed   By: Kathreen Devoid   On: 11/17/2017 21:10   Dg Shoulder Right  Result Date: 11/17/2017 CLINICAL DATA:  Patient has had weakness x2 days. A lot of falls recently. Patient just got out of a rehab facility for weakness and just got a wheelchair for home. PT states she has generalized right shoulder pain and bottom pain. EXAM: RIGHT SHOULDER - 2+ VIEW COMPARISON:  None. FINDINGS: There is no fracture or dislocation. There are moderate degenerative changes of the acromioclavicular joint. IMPRESSION: No acute injury of the shoulder. Electronically Signed   By: Kathreen Devoid   On: 11/17/2017 21:45   Ct Head Wo Contrast  Result Date: 11/17/2017 CLINICAL DATA:  Weakness x2 days, UTI EXAM: CT HEAD WITHOUT CONTRAST TECHNIQUE: Contiguous axial images were obtained from the base of the skull through the vertex without intravenous contrast. COMPARISON:  08/30/2017 FINDINGS: Brain: No evidence of acute infarction, hemorrhage, hydrocephalus, extra-axial collection or mass lesion/mass effect. Subcortical white matter and periventricular small vessel ischemic changes. Vascular: Mild intracranial atherosclerosis. Skull: Normal. Negative for fracture or focal lesion. Sinuses/Orbits: The visualized paranasal sinuses are essentially clear. The mastoid air cells are unopacified. Other: None. IMPRESSION: No evidence of acute intracranial abnormality. Mild small vessel ischemic changes.  Electronically Signed   By: Julian Hy M.D.   On: 11/17/2017 20:46   US Renal  Result Date: 11/18/2017 CLINICAL DATA:  Hematuria. EXAM: RENAL / URINARY TRACT ULTRASOUND COMPLETE COMPARISON:  CT, 08/30/2017 FINDINGS: Right Kidney: Length: 11.8 cm. Echogenicity within normal limits. No mass or hydronephrosis visualized. Left Kidney: Length: 11.1 cm. Echogenicity within normal limits. No mass or hydronephrosis visualized. Bladder: Appears normal for degree of bladder distention. IMPRESSION: Normal renal ultrasound. Electronically Signed   By: Lajean Manes M.D.   On: 11/18/2017 14:28      Scheduled Meds: . allopurinol  300 mg Oral Daily  . aspirin EC  81 mg Oral QHS  . ciprofloxacin  500 mg Oral BID  . cycloSPORINE  1 drop Both Eyes BID  . enoxaparin (LOVENOX) injection  70 mg Subcutaneous QHS  . fluticasone  2 spray Each Nare Daily  . folic acid  1 mg Oral Daily  . gabapentin  400 mg Oral TID  . insulin aspart  0-9 Units Subcutaneous TID WC  . insulin NPH Human  120 Units Subcutaneous QAC breakfast  . insulin NPH Human  80 Units Subcutaneous QHS  . metFORMIN  500 mg Oral BID WC  . metoprolol tartrate  75 mg Oral BID  . montelukast  10 mg Oral QHS  . multivitamin with minerals  1 tablet Oral Daily  . oxybutynin  10 mg Oral Daily  . pantoprazole  40 mg Oral BID AC  . predniSONE  20 mg Oral Q breakfast   Continuous Infusions:    LOS: 1 day    Time spent in minutes: 45 min taken to review notes and speak with patient and RN    Debbe Odea, MD [facility] Pager: [email or web address] Password [email or web address] 11/19/2017, 2:16 PM

## 2017-11-19 NOTE — Progress Notes (Signed)
Pt states that she will call when ready for CPAP.  RT to monitor and assess as needed.  

## 2017-11-20 DIAGNOSIS — G894 Chronic pain syndrome: Secondary | ICD-10-CM

## 2017-11-20 LAB — BASIC METABOLIC PANEL
ANION GAP: 12 (ref 5–15)
BUN: 25 mg/dL — AB (ref 6–20)
CHLORIDE: 105 mmol/L (ref 101–111)
CO2: 24 mmol/L (ref 22–32)
Calcium: 9.1 mg/dL (ref 8.9–10.3)
Creatinine, Ser: 0.98 mg/dL (ref 0.44–1.00)
GFR calc Af Amer: >60 mL/min (ref >60)
GFR, EST NON AFRICAN AMERICAN: 58 mL/min — AB (ref >60)
GLUCOSE: 124 mg/dL — AB (ref 65–99)
POTASSIUM: 4 mmol/L (ref 3.5–5.1)
Sodium: 141 mmol/L (ref 135–145)

## 2017-11-20 LAB — URINALYSIS, ROUTINE W REFLEX MICROSCOPIC
BILIRUBIN URINE: NEGATIVE
Glucose, UA: 150 mg/dL — AB
KETONES UR: NEGATIVE mg/dL
Nitrite: NEGATIVE
PH: 6 (ref 5.0–8.0)
PROTEIN: NEGATIVE mg/dL
Specific Gravity, Urine: 1.008 (ref 1.005–1.030)

## 2017-11-20 LAB — GLUCOSE, CAPILLARY
GLUCOSE-CAPILLARY: 42 mg/dL — AB (ref 65–99)
GLUCOSE-CAPILLARY: 257 mg/dL — AB (ref 65–99)
Glucose-Capillary: 95 mg/dL (ref 65–99)
Glucose-Capillary: 206 mg/dL — ABNORMAL HIGH (ref 65–99)
Glucose-Capillary: 186 mg/dL — ABNORMAL HIGH (ref 65–99)

## 2017-11-20 LAB — OCCULT BLOOD X 1 CARD TO LAB, STOOL: FECAL OCCULT BLD: NEGATIVE

## 2017-11-20 MED ORDER — BISACODYL 10 MG RE SUPP
10.0000 mg | Freq: Every day | RECTAL | Status: DC
  Administered 2017-11-20 – 2017-11-22 (×2): 10 mg via RECTAL
  Filled 2017-11-20 (×2): qty 1

## 2017-11-20 MED ORDER — INSULIN NPH (HUMAN) (ISOPHANE) 100 UNIT/ML ~~LOC~~ SUSP
40.0000 [IU] | Freq: Every day | SUBCUTANEOUS | Status: DC
  Administered 2017-11-20: 40 [IU] via SUBCUTANEOUS

## 2017-11-20 NOTE — Progress Notes (Signed)
PROGRESS NOTE    Christy May   CZY:[id number]  DOB: 09-Feb-1951  DOA: 11/17/2017 PCP: Lucianne Lei, MD   Brief Narrative:  Christy May 67 y.o. female with history of diabetes mellitus type 2, hypertension, sleep apnea, gout, COPD on chronic steroids presents to the ER because of increasing weakness over the last 3 days.  Patient states he had at least 2 falls over the last 3 days because she felt weak on standing up and felt dizzy.  Unable to ambulate because of the weakness.  3 days ago she states that she was walking to the kitchen when she suddenly lost consciousness.  She was admitted from 2/21 through 2/26 for e coli UTI and sepsis, possibly adrenal insufficiency, AKI and discharged to SNF.  She was admitted from 3/11 (from SNF) for lethargy, hypotension and septic shock from "UTI vs GI source". UA was negative and u culture was not sent. C diff antigen was + and toxin negative. She was sent to SNF with Keflex. Flexeril, Norco and Tramadol were discontinued.   In ED: UA shows large EBC, rare bacteria, neg nitrites- Levaquin started. 1 L NS bolus given and started on NS infusion. Started on [employer].   Subjective: Became hypoglycemic this AM. No other complaints.    Assessment & Plan:   Principal Problem:   Acute renal failure- dehydration/ hypotension/ possibly syncope and falls  - suspect some of this dehydration is due to poorly controlled DM - Cr is improving- will stop IVF today  Active Problems:  complicated UTI- Hematuria - has dysuria, and increased frequency - renal ultrasound negative for cause of hematuria and therefore it may be due to a UTI -- cont antibiotics for UTI- unfortunately culture not sent-  - hematuria has resolved today  Diabetes mellitus type 2 in obese - uncontrolled - takes NPH 120 U in AM but not in PM and no short acting insulin (per Dr Loanne Drilling, endo) - started NPH 80 U at bedtime on 5/30 - cont SSI in hospital  - added  Metformin   - hypoglycemic this AM- change bedtime NPH from 80 to 40 U    Morbid obesity -Body mass index is 54.61 kg/m.  Upper airway cough syndrome - cont Solu cortef- patient states she is on chronic 20 mg of Prednisone for lung issues per Dr Lamonte Sakai- I have found a note from 03/15/17 which shows she has upper airway cough syndrome which is prednisone dependant  - cont Singulair as well  Black stools? - check stool occult- she has not had a BM yet  Anemia - Hb 11.1>> 10.8 with IV Hydration    Sleep apnea - CPAP  Essential HTN - Metoprolol     DM neuropathy, type II diabetes mellitus - Gabapentin, Hydrocodone  Urge incontinence - on Ditropan- continue  Mostly Wheelchair bound - will go home with HHPT   DVT prophylaxis: Lovenox Code Status: Full code Family Communication:  Disposition Plan: to be determined- cont to hydrate and follow renal function and sugars- PT eval Consultants:    Procedures:    Antimicrobials:  Anti-infectives (From admission, onward)   Start     Dose/Rate Route Frequency Ordered Stop   11/18/17 1000  ciprofloxacin (CIPRO) IVPB 400 mg  Status:  Discontinued     400 mg 200 mL/hr over 60 Minutes Intravenous Every 12 hours 11/17/17 2247 11/18/17 0803   11/18/17 1000  ciprofloxacin (CIPRO) tablet 500 mg     500 mg Oral 2 times daily  11/18/17 0803     11/17/17 2000  levofloxacin (LEVAQUIN) IVPB 500 mg     500 mg 100 mL/hr over 60 Minutes Intravenous  Once 11/17/17 1949 11/17/17 2236       Objective: Vitals:   11/20/17 0225 11/20/17 0520 11/20/17 0525 11/20/17 1526  BP:   (!) 147/74 (!) 157/75  Pulse:   66 82  Resp: 18  12 18   Temp:   (!) 97.4 F (36.3 C) 98.1 F (36.7 C)  TempSrc:    Oral  SpO2:   96% 94%  Weight:  (!) 144.3 kg (318 lb 2 oz)    Height:        Intake/Output Summary (Last 24 hours) at 11/20/2017 1534 Last data filed at 11/20/2017 1500 Gross per 24 hour  Intake 2461.25 ml  Output 1900 ml  Net 561.25 ml   Filed  Weights   11/18/17 0258 11/19/17 0542 11/20/17 0520  Weight: (!) 142 kg (313 lb) (!) 141.7 kg (312 lb 6.3 oz) (!) 144.3 kg (318 lb 2 oz)    Examination: General exam: Appears comfortable  HEENT: PERRLA, oral mucosa moist, no sclera icterus or thrush Respiratory system: Clear to auscultation. Respiratory effort normal. Cardiovascular system: S1 & S2 heard, RRR.   Gastrointestinal system: morbidly obese- Abdomen soft, non-tender, nondistended. Normal bowel sound. No organomegaly Central nervous system: Alert and oriented. No focal neurological deficits. Extremities: No cyanosis, clubbing or edema Skin: No rashes or ulcers Psychiatry:  Mood & affect appropriate.     Data Reviewed: I have personally reviewed following labs and imaging studies  CBC: Recent Labs  Lab 11/17/17 1613 11/17/17 2243 11/18/17 0210 11/18/17 0419 11/18/17 0757 11/19/17 0429  WBC 11.1* 8.8 9.8 8.8 9.6 9.3  NEUTROABS 9.1*  --   --   --   --   --   HGB 13.2 11.7* 11.9* 11.1* 11.2* 10.8*  HCT 41.2 37.0 37.1 35.3* 35.3* 34.6*  MCV 91.2 91.8 90.3 91.7 91.5 91.1  PLT 274 227 259 241 237 478   Basic Metabolic Panel: Recent Labs  Lab 11/17/17 1613 11/17/17 2243 11/18/17 0419 11/19/17 0429 11/20/17 0409  NA 140  --  137 141 141  K 4.7  --  4.8 4.5 4.0  CL 100*  --  101 106 105  CO2 26  --  24 27 24   GLUCOSE 74  --  314* 242* 124*  BUN 52*  --  44* 27* 25*  CREATININE 1.82* 1.63* 1.46* 1.19* 0.98  CALCIUM 10.0  --  9.0 9.0 9.1   GFR: Estimated Creatinine Clearance: 78.5 mL/min (by C-G formula based on SCr of 0.98 mg/dL). Liver Function Tests: Recent Labs  Lab 11/17/17 1613 11/18/17 0419  AST 21 14*  ALT 35 29  ALKPHOS 83 74  BILITOT 0.4 0.3  PROT 7.7 6.2*  ALBUMIN 3.4* 2.7*   No results for input(s): LIPASE, AMYLASE in the last 168 hours. No results for input(s): AMMONIA in the last 168 hours. Coagulation Profile: No results for input(s): INR, PROTIME in the last 168 hours. Cardiac  Enzymes: Recent Labs  Lab 11/17/17 2243 11/18/17 0419 11/18/17 1217  CKTOTAL 71  --   --   TROPONINI <0.03 <0.03 <0.03   BNP (last 3 results) No results for input(s): PROBNP in the last 8760 hours. HbA1C: Recent Labs    11/18/17 1217  HGBA1C 9.6*   CBG: Recent Labs  Lab 11/19/17 2007 11/20/17 0815 11/20/17 0816 11/20/17 0858 11/20/17 1227  GLUCAP 295* 37* 42*  95 206*   Lipid Profile: No results for input(s): CHOL, HDL, LDLCALC, TRIG, CHOLHDL, LDLDIRECT in the last 72 hours. Thyroid Function Tests: Recent Labs    11/17/17 2243  TSH 1.322   Anemia Panel: No results for input(s): VITAMINB12, FOLATE, FERRITIN, TIBC, IRON, RETICCTPCT in the last 72 hours. Urine analysis:    Component Value Date/Time   COLORURINE STRAW (A) 11/20/2017 0500   APPEARANCEUR CLEAR 11/20/2017 0500   LABSPEC 1.008 11/20/2017 0500   PHURINE 6.0 11/20/2017 0500   GLUCOSEU 150 (A) 11/20/2017 0500   GLUCOSEU NEGATIVE 12/14/2011 1707   HGBUR LARGE (A) 11/20/2017 0500   BILIRUBINUR NEGATIVE 11/20/2017 0500   BILIRUBINUR n 11/16/2014 1515   KETONESUR NEGATIVE 11/20/2017 0500   PROTEINUR NEGATIVE 11/20/2017 0500   UROBILINOGEN 0.2 02/27/2015 2226   NITRITE NEGATIVE 11/20/2017 0500   LEUKOCYTESUR LARGE (A) 11/20/2017 0500   Sepsis Labs: @LABRCNTIP (procalcitonin:4,lacticidven:4) )No results found for this or any previous visit (from the past 240 hour(s)).       Radiology Studies: No results found.    Scheduled Meds: . allopurinol  300 mg Oral Daily  . aspirin EC  81 mg Oral QHS  . bisacodyl  10 mg Rectal Daily  . ciprofloxacin  500 mg Oral BID  . cycloSPORINE  1 drop Both Eyes BID  . enoxaparin (LOVENOX) injection  70 mg Subcutaneous QHS  . fluticasone  2 spray Each Nare Daily  . folic acid  1 mg Oral Daily  . gabapentin  400 mg Oral TID  . insulin aspart  0-9 Units Subcutaneous TID WC  . insulin NPH Human  120 Units Subcutaneous QAC breakfast  . insulin NPH Human  40 Units  Subcutaneous QHS  . metFORMIN  500 mg Oral BID WC  . metoprolol tartrate  75 mg Oral BID  . montelukast  10 mg Oral QHS  . multivitamin with minerals  1 tablet Oral Daily  . oxybutynin  10 mg Oral Daily  . pantoprazole  40 mg Oral BID AC  . predniSONE  20 mg Oral Q breakfast   Continuous Infusions: . sodium chloride Stopped (11/20/17 0833)     LOS: 2 days    Time spent in minutes: 45 min taken to review notes and speak with patient and RN    Debbe Odea, MD [facility] Pager: [email or web address] Password [facility] 11/20/2017, 3:34 PM

## 2017-11-20 NOTE — Plan of Care (Signed)
  Problem: Pain Managment: Goal: General experience of comfort will improve Outcome: Progressing   

## 2017-11-21 ENCOUNTER — Encounter: Payer: Self-pay | Admitting: *Deleted

## 2017-11-21 LAB — BASIC METABOLIC PANEL WITH GFR
Anion gap: 11 (ref 5–15)
BUN: 22 mg/dL — ABNORMAL HIGH (ref 6–20)
CO2: 27 mmol/L (ref 22–32)
Calcium: 9.4 mg/dL (ref 8.9–10.3)
Chloride: 103 mmol/L (ref 101–111)
Creatinine, Ser: 1.21 mg/dL — ABNORMAL HIGH (ref 0.44–1.00)
GFR calc Af Amer: 52 mL/min — ABNORMAL LOW
GFR calc non Af Amer: 45 mL/min — ABNORMAL LOW
Glucose, Bld: 104 mg/dL — ABNORMAL HIGH (ref 65–99)
Potassium: 4.1 mmol/L (ref 3.5–5.1)
Sodium: 141 mmol/L (ref 135–145)

## 2017-11-21 LAB — GLUCOSE, CAPILLARY
GLUCOSE-CAPILLARY: 146 mg/dL — AB (ref 65–99)
GLUCOSE-CAPILLARY: 125 mg/dL — AB (ref 65–99)
Glucose-Capillary: 96 mg/dL (ref 65–99)
Glucose-Capillary: 269 mg/dL — ABNORMAL HIGH (ref 65–99)

## 2017-11-21 NOTE — Progress Notes (Signed)
PROGRESS NOTE    Christy May   GLO:[id number]  DOB: [birth year]  DOA: 11/17/2017 PCP: Lucianne Lei, MD   Brief Narrative:  Christy May 67 y.o. female with history of diabetes mellitus type 2, hypertension, sleep apnea, gout, COPD on chronic steroids presents to the ER because of increasing weakness over the last 3 days.  Patient states he had at least 2 falls over the last 3 days because she felt weak on standing up and felt dizzy.  Unable to ambulate because of the weakness.  3 days ago she states that she was walking to the kitchen when she suddenly lost consciousness.  She was admitted from 2/21 through 2/26 for e coli UTI and sepsis, possibly adrenal insufficiency, AKI and discharged to SNF.  She was admitted from 3/11 (from SNF) for lethargy, hypotension and septic shock from "UTI vs GI source". UA was negative and u culture was not sent. C diff antigen was + and toxin negative. She was sent to SNF with Keflex. Flexeril, Norco and Tramadol were discontinued.   In ED: UA shows large EBC, rare bacteria, neg nitrites- Levaquin started. 1 L NS bolus given and started on NS infusion. Started on [employer].   Subjective: She woke up early this AM to eat and orange so her sugar would not drop- feels weak- only getting to the bedside commode  Assessment & Plan:   Principal Problem:   Acute renal failure- dehydration/ hypotension/ possibly syncope and falls - BUN 52/ Cr 1.82 on admission  - suspect some of this dehydration is due to poorly controlled DM - Cr was improving (0.98 yesterday) but back up to 1.21 today- ? Chronic baseline - BUN has improved from 52 to 22 showing she is better hydrated  Active Problems:  complicated UTI- gross hematuria - has dysuria and increased frequency - renal ultrasound negative for cause of hematuria and therefore it may be due to a UTI -- cont antibiotics (Cipro) for UTI- stop date 6/7- unfortunately culture not sent-  - hematuria has  resolved as of 6/1  Diabetes mellitus type 2 in obese - uncontrolled - takes NPH 120 U in AM but not in PM and no short acting insulin (per Dr Loanne Drilling, endo) - started NPH 80 U at bedtime on 5/30 - cont SSI in hospital  - added Metformin   - 6/1- hypoglycemic this AM- change bedtime NPH from 80 to 40 U - 6/2 had an orange at 4 AM so her sugars would not drop - sugar 104 at 4 AM and then 96 @ 7 am - I feel we need to stop her bedtime NPH    Morbid obesity -Body mass index is 53.97 kg/m.  Upper airway cough syndrome - cont Solu cortef- patient states she is on chronic 20 mg of Prednisone for lung issues per Dr Lamonte Sakai- I have found a note from 03/15/17 which shows she has upper airway cough syndrome which is prednisone dependant  - cont Singulair as well  Black stools? - stool occult is negative  Anemia - Hb 11.1>> 10.8 with IV Hydration    Sleep apnea - CPAP  Essential HTN - Metoprolol     DM neuropathy, type II diabetes mellitus - Gabapentin, Hydrocodone  Urge incontinence - on Ditropan- continue  Mostly Wheelchair bound - will go home with HHPT   DVT prophylaxis: Lovenox Code Status: Full code Family Communication:  Disposition Plan: f/u Cr- likely home tomorrow with PT   Consultants:  Procedures:    Antimicrobials:  Anti-infectives (From admission, onward)   Start     Dose/Rate Route Frequency Ordered Stop   11/18/17 1000  ciprofloxacin (CIPRO) IVPB 400 mg  Status:  Discontinued     400 mg 200 mL/hr over 60 Minutes Intravenous Every 12 hours 11/17/17 2247 11/18/17 0803   11/18/17 1000  ciprofloxacin (CIPRO) tablet 500 mg     500 mg Oral 2 times daily 11/18/17 0803     11/17/17 2000  levofloxacin (LEVAQUIN) IVPB 500 mg     500 mg 100 mL/hr over 60 Minutes Intravenous  Once 11/17/17 1949 11/17/17 2236       Objective: Vitals:   11/20/17 1526 11/20/17 2130 11/21/17 0425 11/21/17 0428  BP: (!) 157/75 (!) 151/80 (!) 146/62   Pulse: 82 86 85   Resp:  18 18 18    Temp: 98.1 F (36.7 C) 97.6 F (36.4 C) 97.9 F (36.6 C)   TempSrc: Oral Oral Oral   SpO2: 94% 100% 96%   Weight:    (!) 142.6 kg (314 lb 6.4 oz)  Height:        Intake/Output Summary (Last 24 hours) at 11/21/2017 1309 Last data filed at 11/21/2017 1000 Gross per 24 hour  Intake 480 ml  Output 2025 ml  Net -1545 ml   Filed Weights   11/19/17 0542 11/20/17 0520 11/21/17 0428  Weight: (!) 141.7 kg (312 lb 6.3 oz) (!) 144.3 kg (318 lb 2 oz) (!) 142.6 kg (314 lb 6.4 oz)    Examination: General exam: Appears comfortable  HEENT: PERRLA, oral mucosa moist, no sclera icterus or thrush Respiratory system: Clear to auscultation. Respiratory effort normal. Cardiovascular system: S1 & S2 heard, RRR.   Gastrointestinal system: morbidly obese- Abdomen soft, non-tender, nondistended. Normal bowel sound. No organomegaly Central nervous system: Alert and oriented. No focal neurological deficits. Extremities: No cyanosis, clubbing or edema Skin: No rashes or ulcers Psychiatry:  Mood & affect appropriate.     Data Reviewed: I have personally reviewed following labs and imaging studies  CBC: Recent Labs  Lab 11/17/17 1613 11/17/17 2243 11/18/17 0210 11/18/17 0419 11/18/17 0757 11/19/17 0429  WBC 11.1* 8.8 9.8 8.8 9.6 9.3  NEUTROABS 9.1*  --   --   --   --   --   HGB 13.2 11.7* 11.9* 11.1* 11.2* 10.8*  HCT 41.2 37.0 37.1 35.3* 35.3* 34.6*  MCV 91.2 91.8 90.3 91.7 91.5 91.1  PLT 274 227 259 241 237 902   Basic Metabolic Panel: Recent Labs  Lab 11/17/17 1613 11/17/17 2243 11/18/17 0419 11/19/17 0429 11/20/17 0409 11/21/17 0417  NA 140  --  137 141 141 141  K 4.7  --  4.8 4.5 4.0 4.1  CL 100*  --  101 106 105 103  CO2 26  --  24 27 24 27   GLUCOSE 74  --  314* 242* 124* 104*  BUN 52*  --  44* 27* 25* 22*  CREATININE 1.82* 1.63* 1.46* 1.19* 0.98 1.21*  CALCIUM 10.0  --  9.0 9.0 9.1 9.4   GFR: Estimated Creatinine Clearance: 63.2 mL/min (A) (by C-G formula based  on SCr of 1.21 mg/dL (H)). Liver Function Tests: Recent Labs  Lab 11/17/17 1613 11/18/17 0419  AST 21 14*  ALT 35 29  ALKPHOS 83 74  BILITOT 0.4 0.3  PROT 7.7 6.2*  ALBUMIN 3.4* 2.7*   No results for input(s): LIPASE, AMYLASE in the last 168 hours. No results for input(s): AMMONIA in  the last 168 hours. Coagulation Profile: No results for input(s): INR, PROTIME in the last 168 hours. Cardiac Enzymes: Recent Labs  Lab 11/17/17 2243 11/18/17 0419 11/18/17 1217  CKTOTAL 71  --   --   TROPONINI <0.03 <0.03 <0.03   BNP (last 3 results) No results for input(s): PROBNP in the last 8760 hours. HbA1C: No results for input(s): HGBA1C in the last 72 hours. CBG: Recent Labs  Lab 11/20/17 1227 11/20/17 1645 11/20/17 2116 11/21/17 0753 11/21/17 1237  GLUCAP 206* 257* 186* 96 146*   Lipid Profile: No results for input(s): CHOL, HDL, LDLCALC, TRIG, CHOLHDL, LDLDIRECT in the last 72 hours. Thyroid Function Tests: No results for input(s): TSH, T4TOTAL, FREET4, T3FREE, THYROIDAB in the last 72 hours. Anemia Panel: No results for input(s): VITAMINB12, FOLATE, FERRITIN, TIBC, IRON, RETICCTPCT in the last 72 hours. Urine analysis:    Component Value Date/Time   COLORURINE STRAW (A) 11/20/2017 0500   APPEARANCEUR CLEAR 11/20/2017 0500   LABSPEC 1.008 11/20/2017 0500   PHURINE 6.0 11/20/2017 0500   GLUCOSEU 150 (A) 11/20/2017 0500   GLUCOSEU NEGATIVE 12/14/2011 1707   HGBUR LARGE (A) 11/20/2017 0500   BILIRUBINUR NEGATIVE 11/20/2017 0500   BILIRUBINUR n 11/16/2014 1515   KETONESUR NEGATIVE 11/20/2017 0500   PROTEINUR NEGATIVE 11/20/2017 0500   UROBILINOGEN 0.2 02/27/2015 2226   NITRITE NEGATIVE 11/20/2017 0500   LEUKOCYTESUR LARGE (A) 11/20/2017 0500   Sepsis Labs: @LABRCNTIP (procalcitonin:4,lacticidven:4) )No results found for this or any previous visit (from the past 240 hour(s)).       Radiology Studies: No results found.    Scheduled Meds: . allopurinol  300  mg Oral Daily  . aspirin EC  81 mg Oral QHS  . bisacodyl  10 mg Rectal Daily  . ciprofloxacin  500 mg Oral BID  . cycloSPORINE  1 drop Both Eyes BID  . enoxaparin (LOVENOX) injection  70 mg Subcutaneous QHS  . fluticasone  2 spray Each Nare Daily  . folic acid  1 mg Oral Daily  . gabapentin  400 mg Oral TID  . insulin aspart  0-9 Units Subcutaneous TID WC  . insulin NPH Human  120 Units Subcutaneous QAC breakfast  . metFORMIN  500 mg Oral BID WC  . metoprolol tartrate  75 mg Oral BID  . montelukast  10 mg Oral QHS  . multivitamin with minerals  1 tablet Oral Daily  . oxybutynin  10 mg Oral Daily  . pantoprazole  40 mg Oral BID AC  . predniSONE  20 mg Oral Q breakfast   Continuous Infusions:    LOS: 3 days    Time spent in minutes: 45 min taken to review notes and speak with patient and RN    Debbe Odea, MD [facility] Pager: [email or web address] Password [email or web address] 11/21/2017, 1:09 PM

## 2017-11-22 LAB — GLUCOSE, CAPILLARY
Glucose-Capillary: 37 mg/dL — CL (ref 65–99)
Glucose-Capillary: 122 mg/dL — ABNORMAL HIGH (ref 65–99)

## 2017-11-22 LAB — MRSA PCR SCREENING: MRSA by PCR: POSITIVE — AB

## 2017-11-22 MED ORDER — CIPROFLOXACIN HCL 500 MG PO TABS: 500.0000 mg | ORAL_TABLET | Freq: Two times a day (BID) | ORAL | 0 refills | Status: DC

## 2017-11-22 MED ORDER — MUPIROCIN 2 % EX OINT: 1.0000 "application " | TOPICAL_OINTMENT | Freq: Two times a day (BID) | CUTANEOUS | 0 refills | Status: DC

## 2017-11-22 MED ORDER — RANITIDINE HCL 300 MG PO TABS: 300.0000 mg | ORAL_TABLET | Freq: Two times a day (BID) | ORAL | 0 refills | Status: DC

## 2017-11-22 MED ORDER — GABAPENTIN 400 MG PO CAPS: 400.0000 mg | ORAL_CAPSULE | Freq: Three times a day (TID) | ORAL | 0 refills | Status: DC

## 2017-11-22 MED ORDER — BISACODYL 10 MG RE SUPP: 10.0000 mg | Freq: Every day | RECTAL | 0 refills | Status: DC

## 2017-11-22 MED ORDER — METFORMIN HCL 500 MG PO TABS: 500.0000 mg | ORAL_TABLET | Freq: Two times a day (BID) | ORAL | 0 refills | Status: DC

## 2017-11-22 MED ORDER — MUPIROCIN 2 % EX OINT
1.0000 "application " | TOPICAL_OINTMENT | Freq: Two times a day (BID) | CUTANEOUS | Status: DC
  Administered 2017-11-22 (×2): 1 via NASAL
  Filled 2017-11-22: qty 22

## 2017-11-22 MED ORDER — CHLORHEXIDINE GLUCONATE CLOTH 2 % EX PADS
6.0000 | MEDICATED_PAD | Freq: Every day | CUTANEOUS | Status: DC
  Administered 2017-11-22: 6 via TOPICAL

## 2017-11-22 NOTE — Progress Notes (Signed)
[facility] called for transportation  

## 2017-11-22 NOTE — Progress Notes (Signed)
Physical Therapy Treatment Patient Details Name: Christy May MRN: [id number] DOB: 1950/08/22 Today's Date: 11/22/2017    History of Present Illness 67 y.o. female with history of diabetes mellitus type 2, hypertension, sleep apnea, gout, COPD on chronic steroids presents with generalized weakness with recent hx of recurrent UTIs    PT Comments    Assisted OOB to [facility] and back to bed.  Pt required assist for set up and assist to rise from lower toilet/BSC level.  Performed transfers as per chart review pt is going home.  Pt has all equipment and getting home via [facility].   Follow Up Recommendations  SNF     Equipment Recommendations  None recommended by PT    Recommendations for Other Services       Precautions / Restrictions Precautions Precautions: Fall Restrictions Weight Bearing Restrictions: No    Mobility  Bed Mobility Overal bed mobility: Needs Assistance Bed Mobility: Supine to Sit;Sit to Supine     Supine to sit: Min guard Sit to supine: Min guard   General bed mobility comments: utilized HOB elevated and bed rails to self assist  Transfers Overall transfer level: Needs assistance Equipment used: None Transfers: Sit to/from [facility] Sit to Stand: Min assist Stand pivot transfers: Min assist       General transfer comment: assist to rise and steady, able to take a couple small effortful steps up [facility]   assisted to and from [facility]  Ambulation/Gait             General Gait Details: transfers only   Stairs             Wheelchair Mobility    Modified Rankin (Stroke Patients Only)       Balance                                            Cognition Arousal/Alertness: Awake/alert Behavior During Therapy: WFL for tasks assessed/performed Overall Cognitive Status: Within Functional Limits for tasks assessed                                        Exercises      General Comments        Pertinent Vitals/Pain Pain Assessment: Faces Faces Pain Scale: Hurts a little bit Pain Location: R LE Pain Descriptors / Indicators: Burning Pain Intervention(s): Monitored during session;Repositioned    Home Living                      Prior Function            PT Goals (current goals can now be found in the care plan section) Progress towards PT goals: Progressing toward goals    Frequency    Min 2X/week      PT Plan Current plan remains appropriate    Co-evaluation              AM-PAC PT "6 Clicks" Daily Activity  Outcome Measure  Difficulty turning over in bed (including adjusting bedclothes, sheets and blankets)?: A Lot Difficulty moving from lying on back to sitting on the side of the bed? : A Lot Difficulty sitting down on and standing up from a chair with arms (e.g., wheelchair, bedside commode, etc,.)?: Unable Help needed moving  to and from a bed to chair (including a wheelchair)?: A Little Help needed walking in hospital room?: A Lot Help needed climbing 3-5 steps with a railing? : Total 6 Click Score: 11    End of Session Equipment Utilized During Treatment: Gait belt Activity Tolerance: Patient tolerated treatment well Patient left: in bed;with bed alarm set;with call bell/phone within reach;with nursing/sitter in room   PT Visit Diagnosis: Difficulty in walking, not elsewhere classified (R26.2);History of falling (Z91.81)     Time: 5830-9407 PT Time Calculation (min) (ACUTE ONLY): 16 min  Charges:  $Therapeutic Activity: 8-22 mins                    G Codes:       Rica Koyanagi  PTA [facility]  Acute  Rehab Pager      [phone]

## 2017-11-22 NOTE — Discharge Instructions (Signed)
Check your sugars 3 x day before meals and at bedtime. Take that record to your PCP at each visit.  Try hard to lose weight by changing what you eat- decreasing simple carbohydrates, fats and portions and increasing Protein and vegetables.   You were cared for by a hospitalist during your hospital stay. If you have any questions about your discharge medications or the care you received while you were in the hospital after you are discharged, you can call the unit and asked to speak with the hospitalist on call if the hospitalist that took care of you is not available. Once you are discharged, your primary care physician will handle any further medical issues. Please note that NO REFILLS for any discharge medications will be authorized once you are discharged, as it is imperative that you return to your primary care physician (or establish a relationship with a primary care physician if you do not have one) for your aftercare needs so that they can reassess your need for medications and monitor your lab values.  Please take all your medications with you for your next visit with your Primary MD. Please ask your Primary MD to get all Hospital records sent to his/her office. Please request your Primary MD to go over all hospital test results at the follow up.    If you experience worsening of your admission symptoms, develop shortness of breath, chest pain, suicidal or homicidal thoughts or a life threatening emergency, you must seek medical attention immediately by calling 911 or calling your MD.   Dennis Bast must read the complete instructions/literature along with all the possible adverse reactions/side effects for all the medicines you take including new medications that have been prescribed to you. Take new medicines after you have completely understood and accpet all the possible adverse reactions/side effects.    Do not drive when taking pain medications or sedatives.     Do not take more than prescribed  Pain, Sleep and Anxiety Medications   If you have smoked or chewed Tobacco in the last 2 yrs please stop. Stop any regular alcohol  and or recreational drug use.   Wear Seat belts while driving.

## 2017-11-22 NOTE — Discharge Summary (Signed)
Physician Discharge Summary  Christy May MGQ:[id number] DOB: 04-20-1951 DOA: 11/17/2017  PCP: Lucianne Lei, MD  Admit date: 11/17/2017 Discharge date: 11/22/2017  Admitted From: home  Disposition:  home   Recommendations for Outpatient Follow-up:  1. Needs aggressive weight loss  Home Health:  Being resumed    Discharge Condition:  stable   CODE STATUS:  Full code   Consultations:  None     Discharge Diagnoses:  Principal Problem:   Acute renal failure (ARF) ([place]) Active Problems:   Acute lower UTI   Morbid obesity ([place])   Essential hypertension   Sleep apnea   Weakness generalized  Diabetes mellitus type 2 in obese Uncontrolled   DM neuropathy, type II diabetes mellitus ([place])     Brief Summary: Christy May 67 y.o.femalewithhistory of diabetes mellitus type 2, hypertension, sleep apnea, gout, COPD on chronic steroids presents to the ER because of increasing weakness over the last 3 days. Patient states he had at least 2 falls over the last 3 days because she felt weak on standing up and felt dizzy. Unable to ambulate because of the weakness. 3 days ago she states that she was walking to the kitchen when she suddenly lost consciousness.  She was admitted from 2/21 through 2/26 for e coli UTI and sepsis, possibly adrenal insufficiency, AKI and discharged to SNF.  She was admitted from 3/11 (from SNF) for lethargy, hypotension and septic shock from "UTI vs GI source". UA was negative and u culture was not sent. C diff antigen was + and toxin negative. She was sent to SNF with Keflex. Flexeril, Norco and Tramadol were discontinued.   In ED: UA shows large WBC, rare bacteria, neg nitrites- Levaquin started. 1 L NS bolus given and started on NS infusion. Started on [employer].      Hospital Course:  Principal Problem:   Acute renal failure- CKD 3 - dehydration/ hypotension/ possibly syncope and frequent falls - BUN 52/ Cr 1.82 on admission  - suspect  some of this dehydration is due to poorly controlled DM - Cr was improving (0.98 yesterday) but back up to 1.21  - ? Chronic baseline - BUN has improved from 52 to 22 showing she is better hydrated  Active Problems:  complicated UTI- gross hematuria- WBC 11 - has dysuria and increased frequency - renal ultrasound negative for cause of hematuria and therefore hematuria may be related to the UTI -- cont antibiotics (Cipro) for UTI-  - unfortunately culture not sent-  - hematuria has resolved as of 6/1 - last dose of Cipro will be tomorrow evening  Diabetes mellitus type 2 in obese - uncontrolled - takes NPH 120 U in AM but not in PM and no short acting insulin (per Dr Loanne Drilling, endo) - attempted to add bedtime Novolog but she was becoming hypoglycemic in AM even with a low dose and a snack  - on SSI in hospital  - added Metformin       Morbid obesity -Body mass index is 53.97 kg/m - have strictly advised to lose weight - HHRN to do teaching-   Dietician should be ordered if available   Upper airway cough syndrome - cont Solu cortef- patient states she is on chronic 20 mg of Prednisone for lung issues per Dr Lamonte Sakai- I have found a note from 03/15/17 which shows she has upper airway cough syndrome which is prednisone dependant  - cont Singulair as well  Black stools at home? - stool occult is negative  H/o GERD - Prednisone likely adds to her symptoms - cont BID PPI- change H2 blocker to BID as well  Anemia - Hb 11.1>> 10.8 with IV Hydration    Sleep apnea - CPAP  Essential HTN - Metoprolol     DM neuropathy, type II diabetes mellitus - Gabapentin, Hydrocodone  Urge incontinence - on Ditropan- continue  Mostly Wheelchair bound - will go home with HHPT  Discharge Exam: Vitals:   11/21/17 2215 11/22/17 0439  BP: 136/81 (!) 155/71  Pulse: 82 81  Resp: 20 16  Temp: 98.5 F (36.9 C) 97.6 F (36.4 C)  SpO2: 98% 95%   Vitals:   11/21/17 0428 11/21/17  1339 11/21/17 2215 11/22/17 0439  BP:  132/64 136/81 (!) 155/71  Pulse:  83 82 81  Resp:  19 20 16   Temp:  98.8 F (37.1 C) 98.5 F (36.9 C) 97.6 F (36.4 C)  TempSrc:  Oral Oral Oral  SpO2:  92% 98% 95%  Weight: (!) 142.6 kg (314 lb 6.4 oz)   (!) 141 kg (310 lb 13.6 oz)  Height:        General: Pt is alert, awake, not in acute distress Cardiovascular: RRR, S1/S2 +, no rubs, no gallops Respiratory: CTA bilaterally, no wheezing, no rhonchi Abdominal: Soft, NT, ND, bowel sounds + Extremities: no edema, no cyanosis   Discharge Instructions  Discharge Instructions    Diet - low sodium heart healthy   Complete by:  As directed    Increase activity slowly   Complete by:  As directed      Allergies as of 11/22/2017      Reactions   Ace Inhibitors Anaphylaxis, Cough   Penicillins Hives, Other (See Comments)   Tolerated ceftriaxone in 2019 Has patient had a PCN reaction causing immediate rash, facial/tongue/throat swelling, SOB or lightheadedness with hypotension: Yes Has patient had a PCN reaction causing severe rash involving mucus membranes or skin necrosis:  No Has patient had a PCN reaction that required hospitalization: No Has patient had a PCN reaction occurring within the last 10 years: No If all of the above answers are "NO", then may proceed with Cephalosporin use.   Adhesive [tape] Hives, Rash      Medication List    STOP taking these medications   doxycycline 100 MG capsule Commonly known as:  VIBRAMYCIN   gabapentin 800 MG tablet Commonly known as:  NEURONTIN Replaced by:  gabapentin 400 MG capsule   glucose blood test strip Commonly known as:  ONETOUCH VERIO     TAKE these medications   albuterol (2.5 MG/3ML) 0.083% nebulizer solution Commonly known as:  PROVENTIL Take 2.5 mg by nebulization 4 (four) times daily as needed for wheezing or shortness of breath.   allopurinol 300 MG tablet Commonly known as:  ZYLOPRIM Take 300 mg by mouth daily.    aspirin EC 81 MG tablet Take 81 mg by mouth at bedtime.   bisacodyl 10 MG suppository Commonly known as:  DULCOLAX Place 1 suppository (10 mg total) rectally daily. Start taking on:  11/23/2017   ciprofloxacin 500 MG tablet Commonly known as:  CIPRO Take 1 tablet (500 mg total) by mouth 2 (two) times daily.   cycloSPORINE 0.05 % ophthalmic emulsion Commonly known as:  RESTASIS Place 1 drop into both eyes 2 (two) times daily.   fluticasone 50 MCG/ACT nasal spray Commonly known as:  FLONASE SHAKE LIQUID AND USE 2 SPRAYS IN EACH NOSTRIL DAILY   folic acid 1 MG tablet  Commonly known as:  FOLVITE Take 1 mg by mouth once a day   gabapentin 400 MG capsule Commonly known as:  NEURONTIN Take 1 capsule (400 mg total) by mouth 3 (three) times daily. Replaces:  gabapentin 800 MG tablet   HYDROcodone-acetaminophen 7.5-325 MG tablet Commonly known as:  NORCO Take 1 tablet by mouth every 8 (eight) hours as needed.   insulin NPH Human 100 UNIT/ML injection Commonly known as:  NOVOLIN N RELION Inject 1.2 mLs (120 Units total) into the skin every morning.   INSULIN SYRINGE 1CC/31GX5/16" 31G X 5/16" 1 ML Misc USE TO INJECT INSULIN TWICE DAILY   ipratropium 0.03 % nasal spray Commonly known as:  ATROVENT USE 2 SPRAYS IN EACH NOSTRIL THREE TIMES DAILY AS NEEDED FOR RHINITIS   irbesartan 300 MG tablet Commonly known as:  AVAPRO Take 300 mg by mouth daily.   LIMENCIN 4-4 % Ptch Generic drug:  Lidocaine-Menthol Apply 1 patch topically See admin instructions. Apply 1 patch daily to the right shoulder/remove each evening   loratadine 10 MG tablet Commonly known as:  CLARITIN Take 10 mg by mouth daily.   metFORMIN 500 MG tablet Commonly known as:  GLUCOPHAGE Take 1 tablet (500 mg total) by mouth 2 (two) times daily with a meal.   metoprolol tartrate 50 MG tablet Commonly known as:  LOPRESSOR Take 75 mg by mouth 2 (two) times daily.   montelukast 10 MG tablet Commonly known as:   SINGULAIR TAKE 1 TABLET(10 MG) BY MOUTH DAILY   multivitamin with minerals tablet Take 1 tablet by mouth daily.   mupirocin ointment 2 % Commonly known as:  BACTROBAN Place 1 application into the nose 2 (two) times daily.   ONE TOUCH LANCETS Misc Use to check sugars twice daily DX E11.22   oxybutynin 10 MG 24 hr tablet Commonly known as:  DITROPAN-XL Take 1 tablet (10 mg total) by mouth daily.   pantoprazole 40 MG tablet Commonly known as:  PROTONIX TAKE 1 TABLET(40 MG) BY MOUTH TWICE DAILY   predniSONE 10 MG tablet Commonly known as:  DELTASONE Take 2 tablets (20 mg total) by mouth daily with breakfast.   ranitidine 300 MG tablet Commonly known as:  ZANTAC Take 1 tablet (300 mg total) by mouth 2 (two) times daily. What changed:  See the new instructions.   sodium chloride 0.65 % Soln nasal spray Commonly known as:  OCEAN Place 2 sprays into both nostrils 2 (two) times daily.   VITAMIN B12-FOLIC ACID PO Take 1 tablet by mouth daily.   Vitamin D-3 1000 units Caps Take 2,000 Units by mouth daily.      Follow-up Information    Lucianne Lei, MD Follow up in 1 week(s).   Specialty:  Family Medicine Contact information: [address] [place]  [phone]          Allergies  Allergen Reactions  . Ace Inhibitors Anaphylaxis and Cough  . Penicillins Hives and Other (See Comments)    Tolerated ceftriaxone in 2019  Has patient had a PCN reaction causing immediate rash, facial/tongue/throat swelling, SOB or lightheadedness with hypotension: Yes Has patient had a PCN reaction causing severe rash involving mucus membranes or skin necrosis:  No Has patient had a PCN reaction that required hospitalization: No Has patient had a PCN reaction occurring within the last 10 years: No If all of the above answers are "NO", then may proceed with Cephalosporin use.  . Adhesive [Tape] Hives and Rash  Procedures/Studies:   Dg Chest 2 View  Result  Date: 11/17/2017 CLINICAL DATA:  Weakness for 2 days. EXAM: CHEST - 2 VIEW COMPARISON:  May 02, 2018 FINDINGS: Stable cardiomegaly. The hila and mediastinum are stable. No pulmonary nodules or masses. Mild atelectasis in the bases. No focal infiltrate. IMPRESSION: No active cardiopulmonary disease. Electronically Signed   By: Dorise Bullion III M.D   On: 11/17/2017 16:38   Dg Pelvis 1-2 Views  Result Date: 11/17/2017 CLINICAL DATA:  Status post fall, pain EXAM: PELVIS - 1-2 VIEW COMPARISON:  None. FINDINGS: There is no evidence of pelvic fracture or diastasis. No pelvic bone lesions are seen. IMPRESSION: Negative. Electronically Signed   By: Kathreen Devoid   On: 11/17/2017 21:10   Dg Shoulder Right  Result Date: 11/17/2017 CLINICAL DATA:  Patient has had weakness x2 days. A lot of falls recently. Patient just got out of a rehab facility for weakness and just got a wheelchair for home. PT states she has generalized right shoulder pain and bottom pain. EXAM: RIGHT SHOULDER - 2+ VIEW COMPARISON:  None. FINDINGS: There is no fracture or dislocation. There are moderate degenerative changes of the acromioclavicular joint. IMPRESSION: No acute injury of the shoulder. Electronically Signed   By: Kathreen Devoid   On: 11/17/2017 21:45   Ct Head Wo Contrast  Result Date: 11/17/2017 CLINICAL DATA:  Weakness x2 days, UTI EXAM: CT HEAD WITHOUT CONTRAST TECHNIQUE: Contiguous axial images were obtained from the base of the skull through the vertex without intravenous contrast. COMPARISON:  08/30/2017 FINDINGS: Brain: No evidence of acute infarction, hemorrhage, hydrocephalus, extra-axial collection or mass lesion/mass effect. Subcortical white matter and periventricular small vessel ischemic changes. Vascular: Mild intracranial atherosclerosis. Skull: Normal. Negative for fracture or focal lesion. Sinuses/Orbits: The visualized paranasal sinuses are essentially clear. The mastoid air cells are unopacified. Other:  None. IMPRESSION: No evidence of acute intracranial abnormality. Mild small vessel ischemic changes. Electronically Signed   By: Julian Hy M.D.   On: 11/17/2017 20:46   US Renal  Result Date: 11/18/2017 CLINICAL DATA:  Hematuria. EXAM: RENAL / URINARY TRACT ULTRASOUND COMPLETE COMPARISON:  CT, 08/30/2017 FINDINGS: Right Kidney: Length: 11.8 cm. Echogenicity within normal limits. No mass or hydronephrosis visualized. Left Kidney: Length: 11.1 cm. Echogenicity within normal limits. No mass or hydronephrosis visualized. Bladder: Appears normal for degree of bladder distention. IMPRESSION: Normal renal ultrasound. Electronically Signed   By: Lajean Manes M.D.   On: 11/18/2017 14:28   Dg Inject Diag/thera/inc Needle/cath/plc Epi/lumb/sac W/img  Result Date: 11/04/2017 CLINICAL DATA:  Spondylosis without myelopathy. Spinal stenosis L4-5. Low back and bilateral leg pain in weakness. FLUOROSCOPY TIME:  0 minutes 39 seconds. 92.15 micro gray meter squared PROCEDURE: The procedure, risks, benefits, and alternatives were explained to the patient. Questions regarding the procedure were encouraged and answered. The patient understands and consents to the procedure. LUMBAR EPIDURAL INJECTION: An interlaminar approach was performed on right at L5-S1. The overlying skin was cleansed and anesthetized. A 6 inch 20 gauge epidural needle was advanced using loss-of-resistance technique. DIAGNOSTIC EPIDURAL INJECTION: Injection of Isovue-M 200 shows a good epidural pattern with spread above and below the level of needle placement, extending to both sides. THERAPEUTIC EPIDURAL INJECTION: One hundred twenty mg of Depo-Medrol mixed with 3 cc 1% lidocaine were instilled. The procedure was well-tolerated, and the patient was discharged thirty minutes following the injection in good condition. COMPLICATIONS: None IMPRESSION: Technically successful epidural injection on the right at L5-S1 # 1. Electronically Signed  By: Nelson Chimes M.D.   On: 11/04/2017 12:10   Dg Hip Unilat With Pelvis 2-3 Views Right  Result Date: 10/25/2017 CLINICAL DATA:  Bilateral lower extremity weakness and pain for the past 5 months. Symptoms are greatest on the right. EXAM: DG HIP (WITH OR WITHOUT PELVIS) 2-3V RIGHT COMPARISON:  Coronal and sagittal reconstructed images through the pelvis and hips from a CT scan of August 30, 2017 FINDINGS: The bony pelvis is subjectively adequately mineralized. There is no acute fracture nor dislocation. AP and lateral views of the right hip reveal preservation of the joint space. A small spur arises from the articular margin of the acetabulum. The femoral neck, intertrochanteric, and subtrochanteric regions are normal. IMPRESSION: There is no acute bony abnormality of the pelvis or right hip. The hip joint spaces are reasonably well-maintained as well. There may be minimal osteoarthritic changes of the right hip. Electronically Signed   By: David  Martinique M.D.   On: 10/25/2017 08:43     The results of significant diagnostics from this hospitalization (including imaging, microbiology, ancillary and laboratory) are listed below for reference.     Microbiology: Recent Results (from the past 240 hour(s))  MRSA PCR Screening     Status: Abnormal   Collection Time: 11/21/17 10:20 PM  Result Value Ref Range Status   MRSA by PCR POSITIVE (A) NEGATIVE Final    Comment:        The GeneXpert MRSA Assay (FDA approved for NASAL specimens only), is one component of a comprehensive MRSA colonization surveillance program. It is not intended to diagnose MRSA infection nor to guide or monitor treatment for MRSA infections. RESULT CALLED TO, READ BACK BY AND VERIFIED WITH: C ADDISON,RN 11/22/17 0015 RHOLMES Performed at [facility], [place] [address], [place], [place] [address]      Labs: BNP (last 3 results) No results for input(s): BNP in the last 8760 hours. Basic Metabolic Panel: Recent Labs   Lab 11/17/17 1613 11/17/17 2243 11/18/17 0419 11/19/17 0429 11/20/17 0409 11/21/17 0417  NA 140  --  137 141 141 141  K 4.7  --  4.8 4.5 4.0 4.1  CL 100*  --  101 106 105 103  CO2 26  --  24 27 24 27   GLUCOSE 74  --  314* 242* 124* 104*  BUN 52*  --  44* 27* 25* 22*  CREATININE 1.82* 1.63* 1.46* 1.19* 0.98 1.21*  CALCIUM 10.0  --  9.0 9.0 9.1 9.4   Liver Function Tests: Recent Labs  Lab 11/17/17 1613 11/18/17 0419  AST 21 14*  ALT 35 29  ALKPHOS 83 74  BILITOT 0.4 0.3  PROT 7.7 6.2*  ALBUMIN 3.4* 2.7*   No results for input(s): LIPASE, AMYLASE in the last 168 hours. No results for input(s): AMMONIA in the last 168 hours. CBC: Recent Labs  Lab 11/17/17 1613 11/17/17 2243 11/18/17 0210 11/18/17 0419 11/18/17 0757 11/19/17 0429  WBC 11.1* 8.8 9.8 8.8 9.6 9.3  NEUTROABS 9.1*  --   --   --   --   --   HGB 13.2 11.7* 11.9* 11.1* 11.2* 10.8*  HCT 41.2 37.0 37.1 35.3* 35.3* 34.6*  MCV 91.2 91.8 90.3 91.7 91.5 91.1  PLT 274 227 259 241 237 210   Cardiac Enzymes: Recent Labs  Lab 11/17/17 2243 11/18/17 0419 11/18/17 1217  CKTOTAL 71  --   --   TROPONINI <0.03 <0.03 <0.03   BNP: Invalid input(s): POCBNP CBG: Recent  Labs  Lab 11/21/17 0753 11/21/17 1237 11/21/17 1636 11/21/17 2212 11/22/17 0803  GLUCAP 96 146* 269* 125* 122*   D-Dimer No results for input(s): DDIMER in the last 72 hours. Hgb A1c No results for input(s): HGBA1C in the last 72 hours. Lipid Profile No results for input(s): CHOL, HDL, LDLCALC, TRIG, CHOLHDL, LDLDIRECT in the last 72 hours. Thyroid function studies No results for input(s): TSH, T4TOTAL, T3FREE, THYROIDAB in the last 72 hours.  Invalid input(s): FREET3 Anemia work up No results for input(s): VITAMINB12, FOLATE, FERRITIN, TIBC, IRON, RETICCTPCT in the last 72 hours. Urinalysis    Component Value Date/Time   COLORURINE STRAW (A) 11/20/2017 0500   APPEARANCEUR CLEAR 11/20/2017 0500   LABSPEC 1.008 11/20/2017 0500    PHURINE 6.0 11/20/2017 0500   GLUCOSEU 150 (A) 11/20/2017 0500   GLUCOSEU NEGATIVE 12/14/2011 1707   HGBUR LARGE (A) 11/20/2017 0500   BILIRUBINUR NEGATIVE 11/20/2017 0500   BILIRUBINUR n 11/16/2014 1515   KETONESUR NEGATIVE 11/20/2017 0500   PROTEINUR NEGATIVE 11/20/2017 0500   UROBILINOGEN 0.2 02/27/2015 2226   NITRITE NEGATIVE 11/20/2017 0500   LEUKOCYTESUR LARGE (A) 11/20/2017 0500   Sepsis Labs Invalid input(s): PROCALCITONIN,  WBC,  LACTICIDVEN Microbiology Recent Results (from the past 240 hour(s))  MRSA PCR Screening     Status: Abnormal   Collection Time: 11/21/17 10:20 PM  Result Value Ref Range Status   MRSA by PCR POSITIVE (A) NEGATIVE Final    Comment:        The GeneXpert MRSA Assay (FDA approved for NASAL specimens only), is one component of a comprehensive MRSA colonization surveillance program. It is not intended to diagnose MRSA infection nor to guide or monitor treatment for MRSA infections. RESULT CALLED TO, READ BACK BY AND VERIFIED WITH: C ADDISON,RN 11/22/17 0015 RHOLMES Performed at [facility], [place] [address], [place], [place] [address]      Time coordinating discharge in minutes: 73  SIGNED:   Debbe Odea, MD  [facility] 11/22/2017, 10:45 AM Pager   If 7PM-7AM, please contact night-coverage [email or web address] Password [email or web address]

## 2017-11-23 ENCOUNTER — Other Ambulatory Visit: Payer: Self-pay | Admitting: *Deleted

## 2017-11-23 NOTE — Patient Outreach (Signed)
[facility] Care Management follow up.  Noted Ms. Marsan discharged home on 11/22/17 with home health resumed with [place].   Will make [place] aware.   Marthenia Rolling, MSN-Ed, RN,BSN [facility] Liaison [phone]

## 2017-11-23 NOTE — Patient Outreach (Signed)
[facility]) Care Management  11/23/2017  Christy May 02/17/1951 [id number]   Notified that member was discharged from hospital yesterday, primary MD office will complete transition of care assessment.  Call placed to member to follow up on current health condition.  She state she is "good."  She has a friend in the home during call helping with some household needs, also had friend to help yesterday.  Report she has contacted [facility] nurse yesterday, will call her again today to inquire about restarting services.  Will also contact their social worker to inquire about when the personal care services will start.  She is anxious about having her follow up appointment with MD on Thursday as she does not have a ramp yet at her door and there is 2 steps to get to the ground level.  [facility] is unable to come into the home to push her across the threshold. She will ask neighbors to be available for help.  She will get a new wheelchair today from [place].    Denies any urgent concerns at this time.  Will follow up within the next 2 weeks.  [facility] CM Care Plan Problem One     Most Recent Value  Care Plan Problem One  Risk for readmission related to UTI/falls as evidenced by recent hospitalization   Role Documenting the Problem One  Care Management [place] for Problem One  Active  [facility] Long Term Goal   Member will not be readmitted to hospital within the next 31 days  [facility] Long Term Goal Start Date  11/23/17 [Original goal met but readmitted 3 days later]  Interventions for Problem One Long Term Goal  Discharge instructions reviewd.  Confirmed member has follow up appointment scheduled with primary MD and transportation secured.  Educated on importance of close follow up with MD in effort to manage health conditions  [facility] CM Short Term Goal #1   Member will report increased support starting in the home within the next 4 weeks  [facility] CM Short Term Goal  #1 Start Date  11/12/17  Interventions for Short Term Goal #1  Advised to contact Social worker with [facility] to inquire about start of personal care services  [facility] CM Short Term Goal #2   Member will have home health restarted within the next week  [facility] CM Short Term Goal #2 Start Date  11/23/17  Interventions for Short Term Goal #2  Advised of importance of restarting with home health, particulary with PT and aide.  Advised to contact office/nurse again to confirm new orders have been placed.     [name], [place], MSN [place] [phone]

## 2017-11-24 DIAGNOSIS — M797 Fibromyalgia: Secondary | ICD-10-CM | POA: Diagnosis not present

## 2017-11-24 DIAGNOSIS — N390 Urinary tract infection, site not specified: Secondary | ICD-10-CM | POA: Diagnosis not present

## 2017-11-24 DIAGNOSIS — J42 Unspecified chronic bronchitis: Secondary | ICD-10-CM | POA: Diagnosis not present

## 2017-11-24 DIAGNOSIS — E1165 Type 2 diabetes mellitus with hyperglycemia: Secondary | ICD-10-CM | POA: Diagnosis not present

## 2017-11-24 DIAGNOSIS — M109 Gout, unspecified: Secondary | ICD-10-CM | POA: Diagnosis not present

## 2017-11-24 DIAGNOSIS — E1140 Type 2 diabetes mellitus with diabetic neuropathy, unspecified: Secondary | ICD-10-CM | POA: Diagnosis not present

## 2017-11-25 DIAGNOSIS — M109 Gout, unspecified: Secondary | ICD-10-CM | POA: Diagnosis not present

## 2017-11-25 DIAGNOSIS — J42 Unspecified chronic bronchitis: Secondary | ICD-10-CM | POA: Diagnosis not present

## 2017-11-25 DIAGNOSIS — I10 Essential (primary) hypertension: Secondary | ICD-10-CM | POA: Diagnosis not present

## 2017-11-25 DIAGNOSIS — M4800 Spinal stenosis, site unspecified: Secondary | ICD-10-CM | POA: Diagnosis not present

## 2017-11-25 DIAGNOSIS — N390 Urinary tract infection, site not specified: Secondary | ICD-10-CM | POA: Diagnosis not present

## 2017-11-25 DIAGNOSIS — E1165 Type 2 diabetes mellitus with hyperglycemia: Secondary | ICD-10-CM | POA: Diagnosis not present

## 2017-11-25 DIAGNOSIS — E1140 Type 2 diabetes mellitus with diabetic neuropathy, unspecified: Secondary | ICD-10-CM | POA: Diagnosis not present

## 2017-11-25 DIAGNOSIS — M797 Fibromyalgia: Secondary | ICD-10-CM | POA: Diagnosis not present

## 2017-11-26 ENCOUNTER — Other Ambulatory Visit: Payer: Self-pay | Admitting: *Deleted

## 2017-11-26 DIAGNOSIS — M797 Fibromyalgia: Secondary | ICD-10-CM | POA: Diagnosis not present

## 2017-11-26 DIAGNOSIS — M109 Gout, unspecified: Secondary | ICD-10-CM | POA: Diagnosis not present

## 2017-11-26 DIAGNOSIS — N390 Urinary tract infection, site not specified: Secondary | ICD-10-CM | POA: Diagnosis not present

## 2017-11-26 DIAGNOSIS — J42 Unspecified chronic bronchitis: Secondary | ICD-10-CM | POA: Diagnosis not present

## 2017-11-26 DIAGNOSIS — E1165 Type 2 diabetes mellitus with hyperglycemia: Secondary | ICD-10-CM | POA: Diagnosis not present

## 2017-11-26 DIAGNOSIS — E1140 Type 2 diabetes mellitus with diabetic neuropathy, unspecified: Secondary | ICD-10-CM | POA: Diagnosis not present

## 2017-11-26 NOTE — Patient Outreach (Signed)
[facility]) Care Management  11/26/2017  DANNE SCARDINA [birth year] [id number]   Notified that member triggered red on [place] discharge dashboard for not taking medications.  Call placed to member, she state she was able to see her primary MD yesterday, however fell in the process.  Pushed her wheelchair out the door over the threshold and down her step and became weak.  EMS was called to help her get up.  State they were also called to help her get back into the home.  Report she will not attend any more appointments until her ramp is placed.  Home health has restarted for nursing, PT, OT, and aide.  Report she discussed frustration regarding medication changes from hospital with primary MD yesterday, she will collaborate with endocrinologist and call member back with update.  State she will take medications as ordered prior to admission until she hear otherwise.    Denies any urgent concerns, home visit remains within the next 2 weeks.  [name], [place], MSN [place] [phone]

## 2017-11-29 DIAGNOSIS — J42 Unspecified chronic bronchitis: Secondary | ICD-10-CM | POA: Diagnosis not present

## 2017-11-29 DIAGNOSIS — N390 Urinary tract infection, site not specified: Secondary | ICD-10-CM | POA: Diagnosis not present

## 2017-11-29 DIAGNOSIS — E1140 Type 2 diabetes mellitus with diabetic neuropathy, unspecified: Secondary | ICD-10-CM | POA: Diagnosis not present

## 2017-11-29 DIAGNOSIS — M797 Fibromyalgia: Secondary | ICD-10-CM | POA: Diagnosis not present

## 2017-11-29 DIAGNOSIS — M109 Gout, unspecified: Secondary | ICD-10-CM | POA: Diagnosis not present

## 2017-11-29 DIAGNOSIS — E1165 Type 2 diabetes mellitus with hyperglycemia: Secondary | ICD-10-CM | POA: Diagnosis not present

## 2017-11-30 DIAGNOSIS — J42 Unspecified chronic bronchitis: Secondary | ICD-10-CM | POA: Diagnosis not present

## 2017-11-30 DIAGNOSIS — E1165 Type 2 diabetes mellitus with hyperglycemia: Secondary | ICD-10-CM | POA: Diagnosis not present

## 2017-11-30 DIAGNOSIS — N390 Urinary tract infection, site not specified: Secondary | ICD-10-CM | POA: Diagnosis not present

## 2017-11-30 DIAGNOSIS — E1140 Type 2 diabetes mellitus with diabetic neuropathy, unspecified: Secondary | ICD-10-CM | POA: Diagnosis not present

## 2017-11-30 DIAGNOSIS — M109 Gout, unspecified: Secondary | ICD-10-CM | POA: Diagnosis not present

## 2017-11-30 DIAGNOSIS — M797 Fibromyalgia: Secondary | ICD-10-CM | POA: Diagnosis not present

## 2017-12-01 DIAGNOSIS — M109 Gout, unspecified: Secondary | ICD-10-CM | POA: Diagnosis not present

## 2017-12-01 DIAGNOSIS — J42 Unspecified chronic bronchitis: Secondary | ICD-10-CM | POA: Diagnosis not present

## 2017-12-01 DIAGNOSIS — M797 Fibromyalgia: Secondary | ICD-10-CM | POA: Diagnosis not present

## 2017-12-01 DIAGNOSIS — E1165 Type 2 diabetes mellitus with hyperglycemia: Secondary | ICD-10-CM | POA: Diagnosis not present

## 2017-12-01 DIAGNOSIS — N390 Urinary tract infection, site not specified: Secondary | ICD-10-CM | POA: Diagnosis not present

## 2017-12-01 DIAGNOSIS — E1140 Type 2 diabetes mellitus with diabetic neuropathy, unspecified: Secondary | ICD-10-CM | POA: Diagnosis not present

## 2017-12-02 DIAGNOSIS — M109 Gout, unspecified: Secondary | ICD-10-CM | POA: Diagnosis not present

## 2017-12-02 DIAGNOSIS — J42 Unspecified chronic bronchitis: Secondary | ICD-10-CM | POA: Diagnosis not present

## 2017-12-02 DIAGNOSIS — N390 Urinary tract infection, site not specified: Secondary | ICD-10-CM | POA: Diagnosis not present

## 2017-12-02 DIAGNOSIS — E1165 Type 2 diabetes mellitus with hyperglycemia: Secondary | ICD-10-CM | POA: Diagnosis not present

## 2017-12-02 DIAGNOSIS — E1140 Type 2 diabetes mellitus with diabetic neuropathy, unspecified: Secondary | ICD-10-CM | POA: Diagnosis not present

## 2017-12-02 DIAGNOSIS — M797 Fibromyalgia: Secondary | ICD-10-CM | POA: Diagnosis not present

## 2017-12-03 DIAGNOSIS — E1140 Type 2 diabetes mellitus with diabetic neuropathy, unspecified: Secondary | ICD-10-CM | POA: Diagnosis not present

## 2017-12-03 DIAGNOSIS — N390 Urinary tract infection, site not specified: Secondary | ICD-10-CM | POA: Diagnosis not present

## 2017-12-03 DIAGNOSIS — E1165 Type 2 diabetes mellitus with hyperglycemia: Secondary | ICD-10-CM | POA: Diagnosis not present

## 2017-12-03 DIAGNOSIS — J42 Unspecified chronic bronchitis: Secondary | ICD-10-CM | POA: Diagnosis not present

## 2017-12-03 DIAGNOSIS — M797 Fibromyalgia: Secondary | ICD-10-CM | POA: Diagnosis not present

## 2017-12-03 DIAGNOSIS — M109 Gout, unspecified: Secondary | ICD-10-CM | POA: Diagnosis not present

## 2017-12-06 ENCOUNTER — Other Ambulatory Visit: Payer: Self-pay | Admitting: *Deleted

## 2017-12-06 DIAGNOSIS — M797 Fibromyalgia: Secondary | ICD-10-CM | POA: Diagnosis not present

## 2017-12-06 DIAGNOSIS — M109 Gout, unspecified: Secondary | ICD-10-CM | POA: Diagnosis not present

## 2017-12-06 DIAGNOSIS — N390 Urinary tract infection, site not specified: Secondary | ICD-10-CM | POA: Diagnosis not present

## 2017-12-06 DIAGNOSIS — E1140 Type 2 diabetes mellitus with diabetic neuropathy, unspecified: Secondary | ICD-10-CM | POA: Diagnosis not present

## 2017-12-06 DIAGNOSIS — J42 Unspecified chronic bronchitis: Secondary | ICD-10-CM | POA: Diagnosis not present

## 2017-12-06 DIAGNOSIS — E1165 Type 2 diabetes mellitus with hyperglycemia: Secondary | ICD-10-CM | POA: Diagnosis not present

## 2017-12-06 NOTE — Patient Outreach (Signed)
Christy May [facility]) Care Management   12/06/2017  Christy May [birth year] [id number]  Christy May is an 67 y.o. female  Subjective:   Member alert and oriented x3, complains of pain, particularly in her shoulder.  State she has talked to her primary MD office to received pain medication, however she will need someone to pick up the prescription and fill it.  She report she has been trying to contact her niece to request more support overall, but has not been successful.   Objective:   Review of Systems  Constitutional: Negative.   HENT: Negative.   Eyes: Negative.   Respiratory: Negative.   Cardiovascular: Negative.   Gastrointestinal: Negative.   Genitourinary: Negative.   Musculoskeletal: Positive for myalgias.  Skin: Negative.   Neurological: Negative.   Endo/Heme/Allergies: Negative.   Psychiatric/Behavioral: Negative.     Physical Exam  Constitutional: She is oriented to person, place, and time. She appears well-developed and well-nourished.  Neck: Normal range of motion.  Cardiovascular: Normal rate.  Respiratory: Effort normal and breath sounds normal.  GI: Soft. Bowel sounds are normal.  Musculoskeletal: Normal range of motion.  Neurological: She is alert and oriented to person, place, and time.  Skin: Skin is warm and dry.   BP 118/78 (BP Location: Left Arm, Patient Position: Sitting, Cuff Size: Normal)   Pulse (!) 105   Resp 20   SpO2 98%   Encounter Medications:   Outpatient Encounter Medications as of 12/06/2017  Medication Sig  . albuterol (PROVENTIL) (2.5 MG/3ML) 0.083% nebulizer solution Take 2.5 mg by nebulization 4 (four) times daily as needed for wheezing or shortness of breath.   . allopurinol (ZYLOPRIM) 300 MG tablet Take 300 mg by mouth daily.  Marland Kitchen aspirin EC 81 MG tablet Take 81 mg by mouth at bedtime.  . bisacodyl (DULCOLAX) 10 MG suppository Place 1 suppository (10 mg total) rectally daily.  . Cholecalciferol (VITAMIN D-3)  1000 units CAPS Take 2,000 Units by mouth daily.  . Cobalamine Combinations (VITAMIN B12-FOLIC ACID PO) Take 1 tablet by mouth daily.  . cycloSPORINE (RESTASIS) 0.05 % ophthalmic emulsion Place 1 drop into both eyes 2 (two) times daily.   . fluticasone (FLONASE) 50 MCG/ACT nasal spray SHAKE LIQUID AND USE 2 SPRAYS IN EACH NOSTRIL DAILY  . folic acid (FOLVITE) 1 MG tablet Take 1 mg by mouth once a day  . gabapentin (NEURONTIN) 400 MG capsule Take 1 capsule (400 mg total) by mouth 3 (three) times daily.  Marland Kitchen HYDROcodone-acetaminophen (NORCO) 7.5-325 MG tablet Take 1 tablet by mouth every 8 (eight) hours as needed.  . insulin NPH Human (NOVOLIN N RELION) 100 UNIT/ML injection Inject 1.2 mLs (120 Units total) into the skin every morning.  . Insulin Syringe-Needle U-100 (INSULIN SYRINGE 1CC/31GX5/16") 31G X 5/16" 1 ML MISC USE TO INJECT INSULIN TWICE DAILY  . ipratropium (ATROVENT) 0.03 % nasal spray USE 2 SPRAYS IN EACH NOSTRIL THREE TIMES DAILY AS NEEDED FOR RHINITIS  . irbesartan (AVAPRO) 300 MG tablet Take 300 mg by mouth daily.  . Lidocaine-Menthol (LIMENCIN) 4-4 % PTCH Apply 1 patch topically See admin instructions. Apply 1 patch daily to the right shoulder/remove each evening  . loratadine (CLARITIN) 10 MG tablet Take 10 mg by mouth daily.  . metFORMIN (GLUCOPHAGE) 500 MG tablet Take 1 tablet (500 mg total) by mouth 2 (two) times daily with a meal.  . metoprolol (LOPRESSOR) 50 MG tablet Take 75 mg by mouth 2 (two) times daily.  . montelukast (SINGULAIR)  10 MG tablet TAKE 1 TABLET(10 MG) BY MOUTH DAILY  . Multiple Vitamins-Minerals (MULTIVITAMIN WITH MINERALS) tablet Take 1 tablet by mouth daily.  . mupirocin ointment (BACTROBAN) 2 % Place 1 application into the nose 2 (two) times daily.  . ONE TOUCH LANCETS MISC Use to check sugars twice daily DX E11.22  . oxybutynin (DITROPAN-XL) 10 MG 24 hr tablet Take 1 tablet (10 mg total) by mouth daily.  . pantoprazole (PROTONIX) 40 MG tablet TAKE 1  TABLET(40 MG) BY MOUTH TWICE DAILY  . predniSONE (DELTASONE) 10 MG tablet Take 2 tablets (20 mg total) by mouth daily with breakfast.  . ranitidine (ZANTAC) 300 MG tablet Take 1 tablet (300 mg total) by mouth 2 (two) times daily.  . sodium chloride (OCEAN) 0.65 % SOLN nasal spray Place 2 sprays into both nostrils 2 (two) times daily.  . ciprofloxacin (CIPRO) 500 MG tablet Take 1 tablet (500 mg total) by mouth 2 (two) times daily. (Patient not taking: Reported on 12/06/2017)   No facility-administered encounter medications on file as of 12/06/2017.     Functional Status:   In your present state of health, do you have any difficulty performing the following activities: 11/17/2017 09/02/2017  Hearing? Y N  Vision? N N  Difficulty concentrating or making decisions? N N  Walking or climbing stairs? Y Y  Dressing or bathing? Y N  Doing errands, shopping? Y Y  Preparing Food and eating ? - -  Using the Toilet? - -  In the past six months, have you accidently leaked urine? - -  Do you have problems with loss of bowel control? - -  Managing your Medications? - -  Managing your Finances? - -  Housekeeping or managing your Housekeeping? - -  Some recent data might be hidden    Fall/Depression Screening:    Fall Risk  08/23/2017 06/24/2017 03/02/2017  Falls in the past year? Yes Yes Yes  Number falls in past yr: 2 or more 1 2 or more  Injury with Fall? No Yes No  Risk Factor Category  - High Fall Risk High Fall Risk  Comment - - -  Risk for fall due to : Impaired balance/gait;History of fall(s);Impaired mobility History of fall(s);Impaired balance/gait;Impaired mobility History of fall(s);Impaired balance/gait;Impaired mobility;Medication side effect;Impaired vision  Follow up Falls prevention discussed Education provided;Falls prevention discussed Falls evaluation completed;Education provided;Falls prevention discussed   PHQ 2/9 Scores 08/23/2017 06/24/2017 03/02/2017 11/24/2016 11/11/2016 01/25/2015  04/19/2014  PHQ - 2 Score 2 0 0 0 0 0 0  PHQ- 9 Score 7 - - - - - -    Assessment:    Met with member at scheduled time.  During visit, [place] home health arrives (aide - [place] - Langley Gauss DiFilippi [phone]).  Between nurse, aide, OT, and PT, member has visit nearly daily.  However, she still does not have adequate assistance in the home.  She report needing assistance emptying bedside commode (only emptied on Mondays & Fridays when aid is in the home) as well as getting in the bed daily (need help getting her legs in bed).  This care manager inquired about friends/family/neighbors that may be available, she state she does not at this time but will think about who would be able to come by.  State there may not be one individual to come by daily but instead a rotation.  This care manager inquired about contact with Ron.  Nurse Langley Gauss state she will contact Ron but there may be  concern that member is not eligible for program due to income.  Further review is needed, including review of medical bills.  Member denies any urgent concerns at this time, aware that this care manager will go to MD office to obtain prescription and fill at pharmacy, then will deliver medications back to the home.  Advised to contact this care manager with questions.  Plan:   Will follow up with member within the next 2 weeks.  [facility] CM Care Plan Problem One     Most Recent Value  Care Plan Problem One  Risk for readmission related to UTI/falls as evidenced by recent hospitalization   Role Documenting the Problem One  Care Management [place] for Problem One  Active  [facility] Long Term Goal   Member will not be readmitted to hospital within the next 31 days  [facility] Long Term Goal Start Date  11/23/17 [Original goal met but readmitted 3 days later]  Interventions for Problem One Long Term Goal  Medications reviewed with member, traveled to MD office and pharmacy to pick up new prescriptions.  Member  educated on importance of following recommended treatment plan in effort to decrease readmission  [facility] CM Short Term Goal #1   Member will report increased support starting in the home within the next 4 weeks  [facility] CM Short Term Goal #1 Start Date  12/06/17 [Not met, date reset]  Interventions for Short Term Goal #1  Collaborated with [place] home health nurse and member to establish long term plan.  Nanine Means SW will contact member as well  [facility] CM Short Term Goal #2   Member will have home health restarted within the next week  [facility] CM Short Term Goal #2 Start Date  11/23/17  [facility] CM Short Term Goal #2 Met Date  12/06/17     [name], RN, MSN [place] Manager [phone]

## 2017-12-07 ENCOUNTER — Telehealth: Payer: Self-pay | Admitting: Endocrinology

## 2017-12-07 DIAGNOSIS — J42 Unspecified chronic bronchitis: Secondary | ICD-10-CM | POA: Diagnosis not present

## 2017-12-07 DIAGNOSIS — E1165 Type 2 diabetes mellitus with hyperglycemia: Secondary | ICD-10-CM | POA: Diagnosis not present

## 2017-12-07 DIAGNOSIS — M109 Gout, unspecified: Secondary | ICD-10-CM | POA: Diagnosis not present

## 2017-12-07 DIAGNOSIS — E1140 Type 2 diabetes mellitus with diabetic neuropathy, unspecified: Secondary | ICD-10-CM | POA: Diagnosis not present

## 2017-12-07 DIAGNOSIS — N390 Urinary tract infection, site not specified: Secondary | ICD-10-CM | POA: Diagnosis not present

## 2017-12-07 DIAGNOSIS — M797 Fibromyalgia: Secondary | ICD-10-CM | POA: Diagnosis not present

## 2017-12-07 NOTE — Telephone Encounter (Signed)
Would like patient to continue this dose, please advise?

## 2017-12-07 NOTE — Telephone Encounter (Signed)
I have called & advised patient. She stated understanding & had no further questions.

## 2017-12-07 NOTE — Telephone Encounter (Signed)
Patient stated that she was in the hospital and doctor put her on metformin 2 times a day. What does ellison want her to do concerning her medication. please advise

## 2017-12-07 NOTE — Telephone Encounter (Signed)
Due to kidneys being off, please reduce metformin to 1 per day.  Please continue the same insulin.  Needs f/u appt next available.

## 2017-12-08 ENCOUNTER — Emergency Department (HOSPITAL_COMMUNITY): Payer: Medicare Other

## 2017-12-08 ENCOUNTER — Emergency Department (HOSPITAL_COMMUNITY)
Admission: EM | Admit: 2017-12-08 | Discharge: 2017-12-09 | Disposition: A | Discharge status: Home / Self Care | Payer: Medicare Other | Attending: Emergency Medicine | Admitting: Emergency Medicine

## 2017-12-08 ENCOUNTER — Encounter (HOSPITAL_COMMUNITY): Payer: Self-pay | Admitting: Internal Medicine

## 2017-12-08 DIAGNOSIS — Z96652 Presence of left artificial knee joint: Secondary | ICD-10-CM | POA: Diagnosis not present

## 2017-12-08 DIAGNOSIS — E1140 Type 2 diabetes mellitus with diabetic neuropathy, unspecified: Secondary | ICD-10-CM | POA: Insufficient documentation

## 2017-12-08 DIAGNOSIS — Z96653 Presence of artificial knee joint, bilateral: Secondary | ICD-10-CM | POA: Insufficient documentation

## 2017-12-08 DIAGNOSIS — Z79899 Other long term (current) drug therapy: Secondary | ICD-10-CM | POA: Insufficient documentation

## 2017-12-08 DIAGNOSIS — E162 Hypoglycemia, unspecified: Secondary | ICD-10-CM | POA: Diagnosis not present

## 2017-12-08 DIAGNOSIS — Z7982 Long term (current) use of aspirin: Secondary | ICD-10-CM | POA: Diagnosis not present

## 2017-12-08 DIAGNOSIS — E11649 Type 2 diabetes mellitus with hypoglycemia without coma: Secondary | ICD-10-CM | POA: Diagnosis not present

## 2017-12-08 DIAGNOSIS — Z8639 Personal history of other endocrine, nutritional and metabolic disease: Secondary | ICD-10-CM | POA: Diagnosis not present

## 2017-12-08 DIAGNOSIS — M25562 Pain in left knee: Secondary | ICD-10-CM

## 2017-12-08 DIAGNOSIS — S79911A Unspecified injury of right hip, initial encounter: Secondary | ICD-10-CM | POA: Diagnosis not present

## 2017-12-08 DIAGNOSIS — M25561 Pain in right knee: Secondary | ICD-10-CM | POA: Insufficient documentation

## 2017-12-08 DIAGNOSIS — W19XXXA Unspecified fall, initial encounter: Secondary | ICD-10-CM

## 2017-12-08 DIAGNOSIS — M25551 Pain in right hip: Secondary | ICD-10-CM | POA: Diagnosis not present

## 2017-12-08 DIAGNOSIS — Z471 Aftercare following joint replacement surgery: Secondary | ICD-10-CM | POA: Diagnosis not present

## 2017-12-08 DIAGNOSIS — Z87891 Personal history of nicotine dependence: Secondary | ICD-10-CM | POA: Insufficient documentation

## 2017-12-08 DIAGNOSIS — Z794 Long term (current) use of insulin: Secondary | ICD-10-CM | POA: Diagnosis not present

## 2017-12-08 DIAGNOSIS — T1490XA Injury, unspecified, initial encounter: Secondary | ICD-10-CM | POA: Diagnosis not present

## 2017-12-08 DIAGNOSIS — I10 Essential (primary) hypertension: Secondary | ICD-10-CM | POA: Insufficient documentation

## 2017-12-08 LAB — I-STAT CHEM 8, ED
BUN: 31 mg/dL — ABNORMAL HIGH (ref 6–20)
CREATININE: 1.1 mg/dL — AB (ref 0.44–1.00)
Calcium, Ion: 1.19 mmol/L (ref 1.15–1.40)
Chloride: 101 mmol/L (ref 101–111)
GLUCOSE: 194 mg/dL — AB (ref 65–99)
HEMATOCRIT: 33 % — AB (ref 36.0–46.0)
Hemoglobin: 11.2 g/dL — ABNORMAL LOW (ref 12.0–15.0)
POTASSIUM: 5.6 mmol/L — AB (ref 3.5–5.1)
Sodium: 136 mmol/L (ref 135–145)
TCO2: 30 mmol/L (ref 22–32)

## 2017-12-08 LAB — BASIC METABOLIC PANEL
ANION GAP: 10 (ref 5–15)
ANION GAP: 10 (ref 5–15)
BUN: 20 mg/dL (ref 6–20)
BUN: 21 mg/dL — AB (ref 6–20)
CALCIUM: 9.8 mg/dL (ref 8.9–10.3)
CALCIUM: 9.9 mg/dL (ref 8.9–10.3)
CO2: 28 mmol/L (ref 22–32)
CO2: 30 mmol/L (ref 22–32)
CREATININE: 1.18 mg/dL — AB (ref 0.44–1.00)
Chloride: 99 mmol/L — ABNORMAL LOW (ref 101–111)
Chloride: 100 mmol/L — ABNORMAL LOW (ref 101–111)
Creatinine, Ser: 1.17 mg/dL — ABNORMAL HIGH (ref 0.44–1.00)
GFR calc Af Amer: 54 mL/min — ABNORMAL LOW (ref >60)
GFR calc non Af Amer: 47 mL/min — ABNORMAL LOW (ref >60)
GFR, EST AFRICAN AMERICAN: 54 mL/min — AB (ref >60)
GFR, EST NON AFRICAN AMERICAN: 46 mL/min — AB (ref >60)
GLUCOSE: 129 mg/dL — AB (ref 65–99)
Glucose, Bld: 193 mg/dL — ABNORMAL HIGH (ref 65–99)
Potassium: 5.5 mmol/L — ABNORMAL HIGH (ref 3.5–5.1)
Potassium: 4.3 mmol/L (ref 3.5–5.1)
SODIUM: 137 mmol/L (ref 135–145)
Sodium: 140 mmol/L (ref 135–145)

## 2017-12-08 LAB — CBC
HCT: 36.7 % (ref 36.0–46.0)
Hemoglobin: 11.1 g/dL — ABNORMAL LOW (ref 12.0–15.0)
MCH: 28.5 pg (ref 26.0–34.0)
MCHC: 30.2 g/dL (ref 30.0–36.0)
MCV: 94.1 fL (ref 78.0–100.0)
Platelets: 251 10*3/uL (ref 150–400)
RBC: 3.9 MIL/uL (ref 3.87–5.11)
RDW: 15.7 % — AB (ref 11.5–15.5)
WBC: 10.8 10*3/uL — AB (ref 4.0–10.5)

## 2017-12-08 LAB — URINALYSIS, ROUTINE W REFLEX MICROSCOPIC
BILIRUBIN URINE: NEGATIVE
Glucose, UA: 50 mg/dL — AB
HGB URINE DIPSTICK: NEGATIVE
KETONES UR: NEGATIVE mg/dL
Leukocytes, UA: NEGATIVE
NITRITE: NEGATIVE
PROTEIN: NEGATIVE mg/dL
Specific Gravity, Urine: 1.01 (ref 1.005–1.030)
pH: 5 (ref 5.0–8.0)

## 2017-12-08 LAB — CBG MONITORING, ED
GLUCOSE-CAPILLARY: 36 mg/dL — AB (ref 65–99)
GLUCOSE-CAPILLARY: 165 mg/dL — AB (ref 65–99)
GLUCOSE-CAPILLARY: 46 mg/dL — AB (ref 65–99)
GLUCOSE-CAPILLARY: 131 mg/dL — AB (ref 65–99)
Glucose-Capillary: 46 mg/dL — ABNORMAL LOW (ref 65–99)

## 2017-12-08 MED ORDER — DEXTROSE 50 % IV SOLN
INTRAVENOUS | Status: AC
  Filled 2017-12-08: qty 50

## 2017-12-08 MED ORDER — MORPHINE SULFATE (PF) 4 MG/ML IV SOLN
4.0000 mg | Freq: Once | INTRAVENOUS | Status: AC
  Administered 2017-12-08: 4 mg via INTRAVENOUS
  Filled 2017-12-08: qty 1

## 2017-12-08 MED ORDER — SODIUM CHLORIDE 0.9% FLUSH
3.0000 mL | Freq: Two times a day (BID) | INTRAVENOUS | Status: DC
  Administered 2017-12-08: 3 mL via INTRAVENOUS

## 2017-12-08 MED ORDER — SODIUM CHLORIDE 0.9 % IV SOLN: 250.0000 mL | INTRAVENOUS | Status: DC | PRN

## 2017-12-08 MED ORDER — DEXTROSE 50 % IV SOLN
1.0000 | Freq: Once | INTRAVENOUS | Status: AC
  Administered 2017-12-08: 50 mL via INTRAVENOUS

## 2017-12-08 MED ORDER — GLUCOSE 4 G PO CHEW
CHEWABLE_TABLET | ORAL | Status: AC
  Administered 2017-12-08: 1
  Filled 2017-12-08: qty 1

## 2017-12-08 MED ORDER — SODIUM CHLORIDE 0.9% FLUSH
3.0000 mL | INTRAVENOUS | Status: DC | PRN
Start: 2017-12-08 — End: 2017-12-09

## 2017-12-08 NOTE — ED Notes (Signed)
[facility] contacted for tx to [address]

## 2017-12-08 NOTE — ED Notes (Signed)
Discharge instructions reviewed, signature pad unavailable, pt verbalized understanding, PTAR took possession of paperwork.

## 2017-12-08 NOTE — ED Notes (Signed)
ED Provider at bedside. 

## 2017-12-08 NOTE — Discharge Instructions (Addendum)
Your xrays were reassuring.  You need to follow up with your PCP regarding your hypoglycemic episode today.  Please eat 3 meals per day and monitor your sugars at home Please follow up with your orthopedist regarding your fall. Your xrays were reassuring.  If you develop worsening or new concerning symptoms you can return to the emergency department for re-evaluation.

## 2017-12-08 NOTE — ED Notes (Signed)
[id number] 

## 2017-12-08 NOTE — ED Notes (Signed)
Patient transported to X-ray 

## 2017-12-08 NOTE — ED Provider Notes (Signed)
[facility] EMERGENCY DEPARTMENT Provider Note   CSN: [id number] Arrival date & time: 12/08/17  1807     History   Chief Complaint Chief Complaint  Patient presents with  . Fall    HPI Christy May is a 67 y.o. female with a history of bilateral knee replacements, anxiety, arthritis, chronic back pain, insulin-dependent diabetes, morbid obesity who presents emergency department today for fall and bilateral knee pain.  Patient reports this morning she was attempting to get out of her wheelchair and transition to use her walker when her dress was caught in it causing her to fall upon standing.  She notes she landed on her bilateral knees.  She denies head trauma or loss of consciousness.  She reports she has had pain with standing and attempting to ambulate has not been able to bear weight since the event.  She was seen by her orthopedist who performed her prior knee replacements, [name], who evaluated her in the office today and ordered outpatient right knee and right hip x-rays.  She reports she did not want to wait and came to the emergency department for evaluation. She denies any open wounds, numbness, tingling or weakness.  Patient also reports that she has increased urinary frequency. Denies dysuria, flank pain, suprapubic pain, abdominal pain, N/V/D,  urgency, or hematuria.  She reports she think her sugars may be low as she has not eaten or drinken anything today and took her insulin this morning. Denies other symptoms.   HPI  Past Medical History:  Diagnosis Date  . Allergy   . Anemia, unspecified   . Anxiety   . Arthritis    "neck, back, hands" (09/03/2014)  . Chronic airway obstruction, not elsewhere classified    "I was told I don't have this/tests done @ [facility] 05/2014"  . Chronic back pain   . Colon polyps   . Complication of anesthesia    "I've had recall; I probably stopped breathing at least 2 times" (09/03/2014)  . Dehydration   .  Depressive disorder, not elsewhere classified   . Diabetic peripheral neuropathy ([place])   . Dysphagia 2007   historyof dysphagia with severe dysmotility by barium swallow-[name]  . Esophageal reflux   . Family history of adverse reaction to anesthesia    "my brother was told they lost him a couple times during OR"  . Heart murmur   . History of hiatal hernia   . Migraine    "couple times/month right now" (09/03/2014)  . Obesity, unspecified   . OSA (obstructive sleep apnea)     CPAP  . Septic shock ([place]) 08/30/2017  . Spondylosis of unspecified site without mention of myelopathy   . Type II diabetes mellitus ([place])   . Unspecified essential hypertension   . Unspecified menopausal and postmenopausal disorder   . Unspecified vitamin D deficiency   . Upper airway cough syndrome     Patient Active Problem List   Diagnosis Date Noted  . DM neuropathy, type II diabetes mellitus ([place]) 11/18/2017  . Weakness 11/18/2017  . SIRS (systemic inflammatory response syndrome) ([place]) 11/17/2017  . Diabetes mellitus type 2 in obese ([place]) 11/17/2017  . Acute lower UTI 11/17/2017  . Septic shock ([place]) 08/30/2017  . Acute renal failure (ARF) ([place]) 08/13/2017  . Pressure injury of skin 08/13/2017  . Sepsis due to gram-negative UTI ([place]) 08/12/2017  . Upper airway cough syndrome 09/28/2016  . Acute bronchitis 10/29/2015  . Diabetes ([place]) 08/21/2015  .  Chest pain 07/14/2015  . Dyspnea on exertion 07/05/2015  . Obstructive sleep apnea 07/05/2015  . Constipation 03/05/2015  . Allergic rhinitis 03/05/2015  . Hearing loss 03/05/2015  . Vocal cord dysfunction 03/05/2015  . Pain in joint, shoulder region 09/11/2014  . Weakness generalized 09/02/2014  . Acute esophagitis 05/08/2014  . Benign neoplasm of sigmoid colon 05/08/2014  . Change in bowel habits 04/17/2014  . Fecal incontinence 04/17/2014  . Urge incontinence 04/08/2014  . Cough 12/06/2013  . Orthostasis 07/18/2013  . Acute kidney  injury ([place]) 07/18/2013  . Bilateral carpal tunnel syndrome 02/14/2013  . Gout 01/04/2013  . Failure to thrive 06/28/2012  . Gait disorder 06/28/2012  . Lower abdominal pain 12/14/2011  . Preventative health care 08/23/2011  . Cervical radiculopathy 02/11/2011  . Low back pain 02/11/2011  . VITAMIN D DEFICIENCY 11/22/2009  . ANEMIA-NOS 11/22/2009  . DEGENERATIVE JOINT DISEASE, CERVICAL SPINE 11/22/2009  . Depression 09/16/2009  . POLYNEUROPATHY 09/16/2009  . Morbid obesity ([place]) 09/09/2009  . Essential hypertension 09/09/2009  . GERD 09/09/2009  . Sleep apnea 09/09/2009    Past Surgical History:  Procedure Laterality Date  . ABDOMINAL HYSTERECTOMY     "partial"  . ANTERIOR CERVICAL DECOMP/DISCECTOMY FUSION     multiple cervical spine levels;Dr. Arnoldo Morale  . APPENDECTOMY    . BACK SURGERY    . CHOLECYSTECTOMY OPEN    . COLONOSCOPY N/A 05/08/2014   Procedure: COLONOSCOPY;  Surgeon: [name], MD;  Location: [facility];  Service: Endoscopy;  Laterality: N/A;  . DILATION AND CURETTAGE OF UTERUS    . ESOPHAGOGASTRODUODENOSCOPY N/A 05/08/2014   Procedure: ESOPHAGOGASTRODUODENOSCOPY (EGD);  Surgeon: [name], MD;  Location: [name];  Service: Endoscopy;  Laterality: N/A;  . EXPLORATORY LAPAROTOMY    . EYE SURGERY    . FOOT SURGERY Right X 2   "took blood out of my arms and put platelets in my feet"  . INCISION AND DRAINAGE ABSCESS     Chest  . JOINT REPLACEMENT    . REFRACTIVE SURGERY Bilateral   . TONSILLECTOMY    . TOTAL KNEE ARTHROPLASTY Left 10/2003  . TOTAL KNEE ARTHROPLASTY Right 01/2004   [name]     OB History   None      Home Medications    Prior to Admission medications   Medication Sig Start Date End Date Taking? Authorizing Provider  albuterol (PROVENTIL) (2.5 MG/3ML) 0.083% nebulizer solution Take 2.5 mg by nebulization 4 (four) times daily as needed for wheezing or shortness of breath.     [provider]  allopurinol (ZYLOPRIM)  300 MG tablet Take 300 mg by mouth daily.    [provider]  aspirin EC 81 MG tablet Take 81 mg by mouth at bedtime.    [provider]  bisacodyl (DULCOLAX) 10 MG suppository Place 1 suppository (10 mg total) rectally daily. 11/23/17   Debbe Odea, MD  Cholecalciferol (VITAMIN D-3) 1000 units CAPS Take 2,000 Units by mouth daily.    [provider]  ciprofloxacin (CIPRO) 500 MG tablet Take 1 tablet (500 mg total) by mouth 2 (two) times daily. Patient not taking: Reported on 12/06/2017 11/22/17   Debbe Odea, MD  Cobalamine Combinations (VITAMIN B12-FOLIC ACID PO) Take 1 tablet by mouth daily.    [provider]  cycloSPORINE (RESTASIS) 0.05 % ophthalmic emulsion Place 1 drop into both eyes 2 (two) times daily.     [provider]  fluticasone (FLONASE) 50 MCG/ACT nasal spray SHAKE LIQUID  AND USE 2 SPRAYS IN EACH NOSTRIL DAILY 03/08/17   Collene Gobble, MD  folic acid (FOLVITE) 1 MG tablet Take 1 mg by mouth once a day 09/03/17   Aline August, MD  gabapentin (NEURONTIN) 400 MG capsule Take 1 capsule (400 mg total) by mouth 3 (three) times daily. 11/22/17   Debbe Odea, MD  HYDROcodone-acetaminophen (NORCO) 7.5-325 MG tablet Take 1 tablet by mouth every 8 (eight) hours as needed. 10/21/17   [provider]  insulin NPH Human (NOVOLIN N RELION) 100 UNIT/ML injection Inject 1.2 mLs (120 Units total) into the skin every morning. 03/09/17   Renato Shin, MD  Insulin Syringe-Needle U-100 (INSULIN SYRINGE 1CC/31GX5/16") 31G X 5/16" 1 ML MISC USE TO INJECT INSULIN TWICE DAILY 05/11/17   Renato Shin, MD  ipratropium (ATROVENT) 0.03 % nasal spray USE 2 SPRAYS IN EACH NOSTRIL THREE TIMES DAILY AS NEEDED FOR RHINITIS 03/17/17   Collene Gobble, MD  irbesartan (AVAPRO) 300 MG tablet Take 300 mg by mouth daily.    [provider]  Lidocaine-Menthol (LIMENCIN) 4-4 % PTCH Apply 1 patch topically See admin instructions. Apply 1 patch daily to the right  shoulder/remove each evening    [provider]  loratadine (CLARITIN) 10 MG tablet Take 10 mg by mouth daily.    [provider]  metFORMIN (GLUCOPHAGE) 500 MG tablet Take 1 tablet (500 mg total) by mouth 2 (two) times daily with a meal. 11/22/17   Debbe Odea, MD  metoprolol (LOPRESSOR) 50 MG tablet Take 75 mg by mouth 2 (two) times daily.    [provider]  montelukast (SINGULAIR) 10 MG tablet TAKE 1 TABLET(10 MG) BY MOUTH DAILY 04/03/16   Magdalen Spatz, NP  Multiple Vitamins-Minerals (MULTIVITAMIN WITH MINERALS) tablet Take 1 tablet by mouth daily.    [provider]  mupirocin ointment (BACTROBAN) 2 % Place 1 application into the nose 2 (two) times daily. 11/22/17   Debbe Odea, MD  ONE TOUCH LANCETS MISC Use to check sugars twice daily DX E11.22 08/23/17   Renato Shin, MD  oxybutynin (DITROPAN-XL) 10 MG 24 hr tablet Take 1 tablet (10 mg total) by mouth daily. 02/18/15   Biagio Borg, MD  pantoprazole (PROTONIX) 40 MG tablet TAKE 1 TABLET(40 MG) BY MOUTH TWICE DAILY 03/02/17   Collene Gobble, MD  predniSONE (DELTASONE) 10 MG tablet Take 2 tablets (20 mg total) by mouth daily with breakfast. 09/03/17   Aline August, MD  ranitidine (ZANTAC) 300 MG tablet Take 1 tablet (300 mg total) by mouth 2 (two) times daily. 11/22/17   Debbe Odea, MD  sodium chloride (OCEAN) 0.65 % SOLN nasal spray Place 2 sprays into both nostrils 2 (two) times daily.    [provider]    Family History Family History  Problem Relation Age of Onset  . Esophageal cancer Brother   . Esophageal cancer Sister        ?  . Colon polyps Brother   . Pancreatic cancer Sister        ?  . Diabetes Sister   . Diabetes Unknown        Aunt and Uncle  . Heart disease Maternal Grandfather     Social History Social History   Tobacco Use  . Smoking status: Former Smoker    Packs/day: 1.00    Years: 20.00    Pack years: 20.00    Types: Cigarettes    Last attempt to quit:  06/22/1986  Years since quitting: 31.4  . Smokeless tobacco: Never Used  Substance Use Topics  . Alcohol use: Yes  . Drug use: No     Allergies   Ace inhibitors; Penicillins; and Adhesive [tape]   Review of Systems Review of Systems  All other systems reviewed and are negative.    Physical Exam Updated Vital Signs There were no vitals taken for this visit.  Physical Exam  Constitutional: She appears well-developed and well-nourished.  Morbidly obese female  HENT:  Head: Normocephalic and atraumatic.  Right Ear: External ear normal.  Left Ear: External ear normal.  Nose: Nose normal.  Mouth/Throat: Uvula is midline, oropharynx is clear and moist and mucous membranes are normal. No tonsillar exudate.  Eyes: Pupils are equal, round, and reactive to light. Right eye exhibits no discharge. Left eye exhibits no discharge. No scleral icterus.  Neck: Trachea normal. Neck supple. No spinous process tenderness present. No neck rigidity. Normal range of motion present.  Cardiovascular: Normal rate, regular rhythm and intact distal pulses.  No murmur heard. Pulses:      Radial pulses are 2+ on the right side, and 2+ on the left side.       Dorsalis pedis pulses are 2+ on the right side, and 2+ on the left side.       Posterior tibial pulses are 2+ on the right side, and 2+ on the left side.  Pulmonary/Chest: Effort normal and breath sounds normal. She exhibits no tenderness.  Abdominal: Soft. Bowel sounds are normal. She exhibits no distension. There is no tenderness. There is no rigidity, no rebound, no guarding and no CVA tenderness.  Musculoskeletal: She exhibits no edema.  Tenderness over the right hip.  No sacral crepitus.  Negative logroll test.  No external rotation or shortening of the right leg. Prior knee replacement scars bilaterally.  No evidence of dehiscence or superimposed infection.  Patient with generalized tenderness of both knees.  No point tenderness.  Able passive  range of motion without difficulty. Patient able to show extension of quadriceps against gravity of b/l legs. No evidence of quadriceps rupture.  No open wounds. Compartments are soft bilaterally.  Patient is neurovascular intact distally.  Lymphadenopathy:    She has no cervical adenopathy.  Neurological: She is alert.  Speech clear. Follows commands. No facial droop. PERRLA. EOM grossly intact. CN III-XII grossly intact. Grossly moves all extremities 4 without ataxia. Able and appropriate strength for age to upper and lower extremities bilaterally.  Skin: Skin is warm, dry and intact. Capillary refill takes less than 2 seconds. No rash noted. She is not diaphoretic.  Psychiatric: She has a normal mood and affect.  Nursing note and vitals reviewed.    ED Treatments / Results  Labs (all labs ordered are listed, but only abnormal results are displayed) Labs Reviewed  CBC - Abnormal; Notable for the following components:      Result Value   WBC 10.8 (*)    Hemoglobin 11.1 (*)    RDW 15.7 (*)    All other components within normal limits  BASIC METABOLIC PANEL - Abnormal; Notable for the following components:   Potassium 5.5 (*)    Chloride 99 (*)    Glucose, Bld 193 (*)    Creatinine, Ser 1.18 (*)    GFR calc non Af Amer 46 (*)    GFR calc Af Amer 54 (*)    All other components within normal limits  URINALYSIS, ROUTINE W REFLEX MICROSCOPIC - Abnormal; Notable  for the following components:   Glucose, UA 50 (*)    All other components within normal limits  BASIC METABOLIC PANEL - Abnormal; Notable for the following components:   Chloride 100 (*)    Glucose, Bld 129 (*)    BUN 21 (*)    Creatinine, Ser 1.17 (*)    GFR calc non Af Amer 47 (*)    GFR calc Af Amer 54 (*)    All other components within normal limits  CBG MONITORING, ED - Abnormal; Notable for the following components:   Glucose-Capillary 36 (*)    All other components within normal limits  CBG MONITORING, ED -  Abnormal; Notable for the following components:   Glucose-Capillary 41 (*)    All other components within normal limits  CBG MONITORING, ED - Abnormal; Notable for the following components:   Glucose-Capillary 46 (*)    All other components within normal limits  CBG MONITORING, ED - Abnormal; Notable for the following components:   Glucose-Capillary 46 (*)    All other components within normal limits  I-STAT CHEM 8, ED - Abnormal; Notable for the following components:   Potassium 5.6 (*)    BUN 31 (*)    Creatinine, Ser 1.10 (*)    Glucose, Bld 194 (*)    Hemoglobin 11.2 (*)    HCT 33.0 (*)    All other components within normal limits  CBG MONITORING, ED - Abnormal; Notable for the following components:   Glucose-Capillary 165 (*)    All other components within normal limits  CBG MONITORING, ED - Abnormal; Notable for the following components:   Glucose-Capillary 131 (*)    All other components within normal limits    EKG EKG Interpretation  Date/Time:  Wednesday December 08 2017 18:18:31 EDT Ventricular Rate:  81 PR Interval:  158 QRS Duration: 76 QT Interval:  388 QTC Calculation: 450 R Axis:   -10 Text Interpretation:  Normal sinus rhythm Minimal voltage criteria for LVH, may be normal variant Borderline ECG Confirmed by Davonna Belling [phone]) on 12/08/2017 8:58:52 PM   Radiology Dg Knee Complete 4 Views Left  Result Date: 12/08/2017 CLINICAL DATA:  Left knee pain after fall EXAM: LEFT KNEE - COMPLETE 4+ VIEW COMPARISON:  None. FINDINGS: No evidence of fracture, dislocation, or joint effusion. No periprosthetic fracture or evidence of loosening. Ossifications along the quadriceps tendon likely from old remote trauma. No evidence of arthropathy or other focal bone abnormality. IMPRESSION: Intact left knee arthroplasty without complicating features. No periprosthetic fracture or loosening noted. Electronically Signed   By: Ashley Royalty M.D.   On: 12/08/2017 22:20   Dg Knee  Complete 4 Views Right  Result Date: 12/08/2017 CLINICAL DATA:  Right knee pain after fall today. EXAM: RIGHT KNEE - COMPLETE 4+ VIEW COMPARISON:  None. FINDINGS: Intact right total knee arthroplasty. No periprosthetic fracture or loosening identified. No joint effusion. IMPRESSION: Intact right total knee arthroplasty without complicating features. No fracture identified. No joint dislocation. Electronically Signed   By: Ashley Royalty M.D.   On: 12/08/2017 22:19   Dg Hip Unilat With Pelvis 2-3 Views Right  Result Date: 12/08/2017 CLINICAL DATA:  Right hip pain after fall. EXAM: DG HIP (WITH OR WITHOUT PELVIS) 2-3V RIGHT COMPARISON:  10/24/2017 FINDINGS: There is no evidence of hip fracture or dislocation. Right gluteal and iliopsoas calcific tendinopathy. No significant soft tissue swelling. Soft tissue calcifications consistent with granulomas or phleboliths project over the pannus. IMPRESSION: No fracture of the right hip.  Right gluteal and iliopsoas calcific tendinopathy. Electronically Signed   By: Ashley Royalty M.D.   On: 12/08/2017 22:23    Procedures Procedures (including critical care time) CRITICAL CARE Performed by: Jillyn Ledger   Total critical care time: 35 minutes - hypoglycemia  Critical care time was exclusive of separately billable procedures and treating other patients.  Critical care was necessary to treat or prevent imminent or life-threatening deterioration.  Critical care was time spent personally by me on the following activities: development of treatment plan with patient and/or surrogate as well as nursing, discussions with consultants, evaluation of patient's response to treatment, examination of patient, obtaining history from patient or surrogate, ordering and performing treatments and interventions, ordering and review of laboratory studies, ordering and review of radiographic studies, pulse oximetry and re-evaluation of patient's condition.  Medications Ordered in  ED Medications  glucose 4 GM chewable tablet (1 tablet  Given 12/08/17 1900)  dextrose 50 % solution 50 mL (50 mLs Intravenous Given 12/08/17 1916)  morphine 4 MG/ML injection 4 mg (4 mg Intravenous Given 12/08/17 2226)     Initial Impression / Assessment and Plan / ED Course  I have reviewed the triage vital signs and the nursing notes.  Pertinent labs & imaging results that were available during my care of the patient were reviewed by me and considered in my medical decision making (see chart for details).     67 y.o. female with a history of bilateral knee replacements, anxiety, arthritis, chronic back pain, insulin-dependent diabetes, morbid obesity who presents emergency department today for mechanical fall and bilateral knee pain. Patient was seen by orthopedist and given outpatient rx for right knee and right hip xrays but did not want to wait. Patient exam limited by patient's size. She is NVI. She has diffuse ttp. No open wounds. Compartments are soft. No evidence of quadriceps rupture. Xrays are reassuring as above. No complications of prior arthroplasty's, fractures or dislocations noted. Will supply short course of pain medication, recommended RICE therapy, and follow up with orthopedist.   During stay patient was noted to be hypoglycemic. Likely related to not eating today and taking insulin in the morning. This resolved after glucose tablets and ampule of D50. Labs reviewed and reassruing. UA without UTI.   The evaluation does not show pathology that would require ongoing emergent intervention or inpatient treatment. I advised the patient to follow-up with PCP this week. She is to call orthopedist for follow up. I advised the patient to return to the emergency department with new or worsening symptoms or new concerns. Specific return precautions discussed. The patient verbalized understanding and agreement with plan. All questions answered. No further questions at this time. The patient  is hemodynamically stable, mentating appropriately and appears safe for discharge.  Patient case seen and discussed with Dr. Alvino Chapel who is in agreement with plan.   Final Clinical Impressions(s) / ED Diagnoses   Final diagnoses:  Fall, initial encounter  Acute pain of right knee  Acute pain of left knee  Right hip pain  Hypoglycemia  History of diabetes mellitus  History of bilateral knee replacement    ED Discharge Orders    None       Lorelle Gibbs 12/09/17 0330    Davonna Belling, MD 12/10/17 1517

## 2017-12-08 NOTE — ED Notes (Signed)
Checked CBG 131, RN Megan informed

## 2017-12-08 NOTE — ED Triage Notes (Signed)
Pt here from home. [facility] dropped the patient off this morning from her doctor's appointment and the patient fell after being home for a short amount of time. Pt fell out of her wheelchair and was found on the ground by [facility]. Pt has order for x-ray of her knees. She is unable to bear weight on her legs. Vital signs stable. Pt has chronic pain in knees. Pt placed in a position of comfort and call bell within reach.

## 2017-12-09 DIAGNOSIS — J42 Unspecified chronic bronchitis: Secondary | ICD-10-CM | POA: Diagnosis not present

## 2017-12-09 DIAGNOSIS — E1165 Type 2 diabetes mellitus with hyperglycemia: Secondary | ICD-10-CM | POA: Diagnosis not present

## 2017-12-09 DIAGNOSIS — R279 Unspecified lack of coordination: Secondary | ICD-10-CM | POA: Diagnosis not present

## 2017-12-09 DIAGNOSIS — M797 Fibromyalgia: Secondary | ICD-10-CM | POA: Diagnosis not present

## 2017-12-09 DIAGNOSIS — N390 Urinary tract infection, site not specified: Secondary | ICD-10-CM | POA: Diagnosis not present

## 2017-12-09 DIAGNOSIS — M109 Gout, unspecified: Secondary | ICD-10-CM | POA: Diagnosis not present

## 2017-12-09 DIAGNOSIS — E1140 Type 2 diabetes mellitus with diabetic neuropathy, unspecified: Secondary | ICD-10-CM | POA: Diagnosis not present

## 2017-12-09 DIAGNOSIS — Z743 Need for continuous supervision: Secondary | ICD-10-CM | POA: Diagnosis not present

## 2017-12-09 MED ORDER — HYDROCODONE-ACETAMINOPHEN 5-325 MG PO TABS
1.0000 | ORAL_TABLET | Freq: Four times a day (QID) | ORAL | 0 refills | Status: DC | PRN
Start: 2017-12-09 — End: 2018-02-23

## 2017-12-09 NOTE — ED Notes (Signed)
Prescription reviewed with patient

## 2017-12-10 DIAGNOSIS — J42 Unspecified chronic bronchitis: Secondary | ICD-10-CM | POA: Diagnosis not present

## 2017-12-10 DIAGNOSIS — M797 Fibromyalgia: Secondary | ICD-10-CM | POA: Diagnosis not present

## 2017-12-10 DIAGNOSIS — N390 Urinary tract infection, site not specified: Secondary | ICD-10-CM | POA: Diagnosis not present

## 2017-12-10 DIAGNOSIS — E1140 Type 2 diabetes mellitus with diabetic neuropathy, unspecified: Secondary | ICD-10-CM | POA: Diagnosis not present

## 2017-12-10 DIAGNOSIS — M109 Gout, unspecified: Secondary | ICD-10-CM | POA: Diagnosis not present

## 2017-12-10 DIAGNOSIS — E1165 Type 2 diabetes mellitus with hyperglycemia: Secondary | ICD-10-CM | POA: Diagnosis not present

## 2017-12-10 LAB — CBG MONITORING, ED: Glucose-Capillary: 41 mg/dL — CL (ref 65–99)

## 2017-12-13 DIAGNOSIS — E1122 Type 2 diabetes mellitus with diabetic chronic kidney disease: Secondary | ICD-10-CM | POA: Diagnosis not present

## 2017-12-13 DIAGNOSIS — I129 Hypertensive chronic kidney disease with stage 1 through stage 4 chronic kidney disease, or unspecified chronic kidney disease: Secondary | ICD-10-CM | POA: Diagnosis not present

## 2017-12-13 DIAGNOSIS — Z794 Long term (current) use of insulin: Secondary | ICD-10-CM | POA: Diagnosis not present

## 2017-12-13 DIAGNOSIS — Z7952 Long term (current) use of systemic steroids: Secondary | ICD-10-CM | POA: Diagnosis not present

## 2017-12-13 DIAGNOSIS — E1165 Type 2 diabetes mellitus with hyperglycemia: Secondary | ICD-10-CM | POA: Diagnosis not present

## 2017-12-13 DIAGNOSIS — M797 Fibromyalgia: Secondary | ICD-10-CM | POA: Diagnosis not present

## 2017-12-13 DIAGNOSIS — Z9181 History of falling: Secondary | ICD-10-CM | POA: Diagnosis not present

## 2017-12-13 DIAGNOSIS — E1140 Type 2 diabetes mellitus with diabetic neuropathy, unspecified: Secondary | ICD-10-CM | POA: Diagnosis not present

## 2017-12-13 DIAGNOSIS — Z87440 Personal history of urinary (tract) infections: Secondary | ICD-10-CM | POA: Diagnosis not present

## 2017-12-13 DIAGNOSIS — J42 Unspecified chronic bronchitis: Secondary | ICD-10-CM | POA: Diagnosis not present

## 2017-12-13 DIAGNOSIS — Z792 Long term (current) use of antibiotics: Secondary | ICD-10-CM | POA: Diagnosis not present

## 2017-12-13 DIAGNOSIS — N183 Chronic kidney disease, stage 3 (moderate): Secondary | ICD-10-CM | POA: Diagnosis not present

## 2017-12-13 DIAGNOSIS — Z96653 Presence of artificial knee joint, bilateral: Secondary | ICD-10-CM | POA: Diagnosis not present

## 2017-12-13 DIAGNOSIS — M109 Gout, unspecified: Secondary | ICD-10-CM | POA: Diagnosis not present

## 2017-12-13 DIAGNOSIS — F17211 Nicotine dependence, cigarettes, in remission: Secondary | ICD-10-CM | POA: Diagnosis not present

## 2017-12-13 DIAGNOSIS — N3941 Urge incontinence: Secondary | ICD-10-CM | POA: Diagnosis not present

## 2017-12-14 ENCOUNTER — Emergency Department (HOSPITAL_COMMUNITY): Payer: Medicare Other

## 2017-12-14 ENCOUNTER — Observation Stay (HOSPITAL_COMMUNITY)
Admission: EM | Admit: 2017-12-14 | Discharge: 2017-12-17 | Disposition: A | Discharge status: Home / Self Care | Payer: Medicare Other | Attending: Internal Medicine | Admitting: Internal Medicine

## 2017-12-14 ENCOUNTER — Other Ambulatory Visit: Payer: Self-pay

## 2017-12-14 DIAGNOSIS — W19XXXA Unspecified fall, initial encounter: Secondary | ICD-10-CM

## 2017-12-14 DIAGNOSIS — Z6841 Body Mass Index (BMI) 40.0 and over, adult: Secondary | ICD-10-CM | POA: Insufficient documentation

## 2017-12-14 DIAGNOSIS — M5126 Other intervertebral disc displacement, lumbar region: Secondary | ICD-10-CM | POA: Insufficient documentation

## 2017-12-14 DIAGNOSIS — R0789 Other chest pain: Secondary | ICD-10-CM | POA: Insufficient documentation

## 2017-12-14 DIAGNOSIS — R531 Weakness: Secondary | ICD-10-CM

## 2017-12-14 DIAGNOSIS — G4733 Obstructive sleep apnea (adult) (pediatric): Secondary | ICD-10-CM | POA: Insufficient documentation

## 2017-12-14 DIAGNOSIS — M545 Low back pain, unspecified: Secondary | ICD-10-CM | POA: Diagnosis present

## 2017-12-14 DIAGNOSIS — E1142 Type 2 diabetes mellitus with diabetic polyneuropathy: Secondary | ICD-10-CM | POA: Diagnosis not present

## 2017-12-14 DIAGNOSIS — M47812 Spondylosis without myelopathy or radiculopathy, cervical region: Secondary | ICD-10-CM | POA: Insufficient documentation

## 2017-12-14 DIAGNOSIS — F419 Anxiety disorder, unspecified: Secondary | ICD-10-CM | POA: Insufficient documentation

## 2017-12-14 DIAGNOSIS — R296 Repeated falls: Secondary | ICD-10-CM | POA: Diagnosis not present

## 2017-12-14 DIAGNOSIS — R011 Cardiac murmur, unspecified: Secondary | ICD-10-CM | POA: Insufficient documentation

## 2017-12-14 DIAGNOSIS — G4730 Sleep apnea, unspecified: Secondary | ICD-10-CM | POA: Diagnosis present

## 2017-12-14 DIAGNOSIS — Z7952 Long term (current) use of systemic steroids: Secondary | ICD-10-CM | POA: Insufficient documentation

## 2017-12-14 DIAGNOSIS — E559 Vitamin D deficiency, unspecified: Secondary | ICD-10-CM | POA: Diagnosis not present

## 2017-12-14 DIAGNOSIS — Z794 Long term (current) use of insulin: Secondary | ICD-10-CM | POA: Insufficient documentation

## 2017-12-14 DIAGNOSIS — I10 Essential (primary) hypertension: Secondary | ICD-10-CM | POA: Diagnosis present

## 2017-12-14 DIAGNOSIS — M19042 Primary osteoarthritis, left hand: Secondary | ICD-10-CM | POA: Insufficient documentation

## 2017-12-14 DIAGNOSIS — I129 Hypertensive chronic kidney disease with stage 1 through stage 4 chronic kidney disease, or unspecified chronic kidney disease: Secondary | ICD-10-CM | POA: Insufficient documentation

## 2017-12-14 DIAGNOSIS — E1122 Type 2 diabetes mellitus with diabetic chronic kidney disease: Secondary | ICD-10-CM | POA: Insufficient documentation

## 2017-12-14 DIAGNOSIS — Z96653 Presence of artificial knee joint, bilateral: Secondary | ICD-10-CM | POA: Insufficient documentation

## 2017-12-14 DIAGNOSIS — N183 Chronic kidney disease, stage 3 (moderate): Secondary | ICD-10-CM | POA: Diagnosis not present

## 2017-12-14 DIAGNOSIS — Z880 Allergy status to penicillin: Secondary | ICD-10-CM | POA: Insufficient documentation

## 2017-12-14 DIAGNOSIS — M5136 Other intervertebral disc degeneration, lumbar region: Secondary | ICD-10-CM | POA: Insufficient documentation

## 2017-12-14 DIAGNOSIS — M7138 Other bursal cyst, other site: Secondary | ICD-10-CM | POA: Diagnosis not present

## 2017-12-14 DIAGNOSIS — D631 Anemia in chronic kidney disease: Secondary | ICD-10-CM | POA: Diagnosis not present

## 2017-12-14 DIAGNOSIS — E669 Obesity, unspecified: Secondary | ICD-10-CM | POA: Diagnosis present

## 2017-12-14 DIAGNOSIS — Z881 Allergy status to other antibiotic agents status: Secondary | ICD-10-CM | POA: Insufficient documentation

## 2017-12-14 DIAGNOSIS — N182 Chronic kidney disease, stage 2 (mild): Secondary | ICD-10-CM | POA: Diagnosis not present

## 2017-12-14 DIAGNOSIS — M109 Gout, unspecified: Secondary | ICD-10-CM | POA: Diagnosis not present

## 2017-12-14 DIAGNOSIS — M48061 Spinal stenosis, lumbar region without neurogenic claudication: Secondary | ICD-10-CM | POA: Diagnosis not present

## 2017-12-14 DIAGNOSIS — Z9989 Dependence on other enabling machines and devices: Secondary | ICD-10-CM | POA: Insufficient documentation

## 2017-12-14 DIAGNOSIS — M4807 Spinal stenosis, lumbosacral region: Secondary | ICD-10-CM | POA: Diagnosis not present

## 2017-12-14 DIAGNOSIS — E1165 Type 2 diabetes mellitus with hyperglycemia: Secondary | ICD-10-CM | POA: Diagnosis not present

## 2017-12-14 DIAGNOSIS — M19041 Primary osteoarthritis, right hand: Secondary | ICD-10-CM | POA: Diagnosis not present

## 2017-12-14 DIAGNOSIS — F329 Major depressive disorder, single episode, unspecified: Secondary | ICD-10-CM | POA: Insufficient documentation

## 2017-12-14 DIAGNOSIS — M5127 Other intervertebral disc displacement, lumbosacral region: Secondary | ICD-10-CM | POA: Diagnosis not present

## 2017-12-14 DIAGNOSIS — E1140 Type 2 diabetes mellitus with diabetic neuropathy, unspecified: Secondary | ICD-10-CM | POA: Diagnosis not present

## 2017-12-14 DIAGNOSIS — R404 Transient alteration of awareness: Secondary | ICD-10-CM | POA: Diagnosis not present

## 2017-12-14 DIAGNOSIS — E1169 Type 2 diabetes mellitus with other specified complication: Secondary | ICD-10-CM | POA: Diagnosis present

## 2017-12-14 DIAGNOSIS — Z7982 Long term (current) use of aspirin: Secondary | ICD-10-CM | POA: Insufficient documentation

## 2017-12-14 DIAGNOSIS — Z79899 Other long term (current) drug therapy: Secondary | ICD-10-CM | POA: Insufficient documentation

## 2017-12-14 DIAGNOSIS — Z888 Allergy status to other drugs, medicaments and biological substances status: Secondary | ICD-10-CM | POA: Insufficient documentation

## 2017-12-14 DIAGNOSIS — Z86010 Personal history of colonic polyps: Secondary | ICD-10-CM | POA: Insufficient documentation

## 2017-12-14 DIAGNOSIS — I959 Hypotension, unspecified: Secondary | ICD-10-CM | POA: Diagnosis not present

## 2017-12-14 DIAGNOSIS — Z87440 Personal history of urinary (tract) infections: Secondary | ICD-10-CM | POA: Diagnosis not present

## 2017-12-14 DIAGNOSIS — J449 Chronic obstructive pulmonary disease, unspecified: Secondary | ICD-10-CM | POA: Insufficient documentation

## 2017-12-14 DIAGNOSIS — Z981 Arthrodesis status: Secondary | ICD-10-CM | POA: Insufficient documentation

## 2017-12-14 LAB — CBC WITH DIFFERENTIAL/PLATELET
ABS IMMATURE GRANULOCYTES: 0.1 10*3/uL (ref 0.0–0.1)
BASOS ABS: 0 10*3/uL (ref 0.0–0.1)
Basophils Relative: 0 %
Eosinophils Absolute: 0 10*3/uL (ref 0.0–0.7)
Eosinophils Relative: 0 %
HCT: 38.3 % (ref 36.0–46.0)
HEMOGLOBIN: 11.3 g/dL — AB (ref 12.0–15.0)
Immature Granulocytes: 1 %
LYMPHS PCT: 13 %
Lymphs Abs: 1.3 10*3/uL (ref 0.7–4.0)
MCH: 28.2 pg (ref 26.0–34.0)
MCHC: 29.5 g/dL — AB (ref 30.0–36.0)
MCV: 95.5 fL (ref 78.0–100.0)
Monocytes Absolute: 0.5 10*3/uL (ref 0.1–1.0)
Monocytes Relative: 5 %
Neutro Abs: 7.5 10*3/uL (ref 1.7–7.7)
Neutrophils Relative %: 81 %
Platelets: 239 10*3/uL (ref 150–400)
RBC: 4.01 MIL/uL (ref 3.87–5.11)
RDW: 15.4 % (ref 11.5–15.5)
WBC: 9.4 10*3/uL (ref 4.0–10.5)

## 2017-12-14 LAB — URINALYSIS, ROUTINE W REFLEX MICROSCOPIC
Bilirubin Urine: NEGATIVE
Glucose, UA: 150 mg/dL — AB
Hgb urine dipstick: NEGATIVE
KETONES UR: NEGATIVE mg/dL
LEUKOCYTES UA: NEGATIVE
Nitrite: NEGATIVE
PH: 5 (ref 5.0–8.0)
Protein, ur: NEGATIVE mg/dL
SPECIFIC GRAVITY, URINE: 1.017 (ref 1.005–1.030)

## 2017-12-14 LAB — COMPREHENSIVE METABOLIC PANEL
ALBUMIN: 3.2 g/dL — AB (ref 3.5–5.0)
ALK PHOS: 72 U/L (ref 38–126)
ALT: 30 U/L (ref 0–44)
ANION GAP: 10 (ref 5–15)
AST: 17 U/L (ref 15–41)
BUN: 25 mg/dL — ABNORMAL HIGH (ref 8–23)
CALCIUM: 9.7 mg/dL (ref 8.9–10.3)
CO2: 29 mmol/L (ref 22–32)
Chloride: 102 mmol/L (ref 98–111)
Creatinine, Ser: 1.2 mg/dL — ABNORMAL HIGH (ref 0.44–1.00)
GFR calc Af Amer: 53 mL/min — ABNORMAL LOW (ref >60)
GFR calc non Af Amer: 45 mL/min — ABNORMAL LOW (ref >60)
GLUCOSE: 179 mg/dL — AB (ref 70–99)
Potassium: 4.7 mmol/L (ref 3.5–5.1)
SODIUM: 141 mmol/L (ref 135–145)
Total Bilirubin: 0.6 mg/dL (ref 0.3–1.2)
Total Protein: 7.6 g/dL (ref 6.5–8.1)

## 2017-12-14 LAB — TROPONIN I: Troponin I: <0.03 ng/mL (ref <0.03)

## 2017-12-14 MED ORDER — MORPHINE SULFATE (PF) 4 MG/ML IV SOLN
4.0000 mg | Freq: Once | INTRAVENOUS | Status: AC
  Administered 2017-12-14: 4 mg via INTRAVENOUS
  Filled 2017-12-14: qty 1

## 2017-12-14 MED ORDER — NYSTATIN 100000 UNIT/GM EX POWD
Freq: Once | CUTANEOUS | Status: AC
  Administered 2017-12-14: 20:00:00 via TOPICAL
  Filled 2017-12-14: qty 15

## 2017-12-14 NOTE — ED Triage Notes (Signed)
Arrives via [facility], increasing chronic weakness. Pt has hx of bilateral knee surgery, has home health and can typically transfer with assistance but not today; fell on Sunday onto knees, increasing weakness since then AOx4 on arrival; [facility] vitals: CBG 208. 120/56; HR 96

## 2017-12-14 NOTE — Progress Notes (Addendum)
CSW spoke with pt at pt's bedside. CSW reviewed plan with pt if she is medically cleared and transitioned home tonight. Pt agreeable to going home with continued home health through [facility] if medically cleared tonight. CSW reached out to [facility] via in basket. CSW will follow up with [facility] on 6/26, pending pt's disposition.  Wendelyn Breslow, [facility]  [phone]

## 2017-12-14 NOTE — ED Provider Notes (Signed)
[facility] EMERGENCY DEPARTMENT Provider Note   CSN: [id number] Arrival date & time: 12/14/17  1525   History   Chief Complaint Chief Complaint  Patient presents with  . Weakness    HPI Christy May is a 66 y.o. female.  HPI Pt has a history of frequent falls.  She estimates that she has fallen almost 60 times since November.  She has been told that it is related to spinal stenosis.  Patient has home health assistance.  Pt normally uses a wheelchair and occasionally stands to transfer with assistance.  She did fall over the weekend onto her knees .  He is having pain associated with that.  Today she was trying to transfer to get to the restroom and she was unable to do it so.  She felt too weak.  She denies any trouble with chest pain or shortness of breath.  No abdominal pain.  No fevers or chills.  No vomiting or diarrhea.  She does have issues with urinary incontinence this is not a new issue. Past Medical History:  Diagnosis Date  . Allergy   . Anemia, unspecified   . Anxiety   . Arthritis    "neck, back, hands" (09/03/2014)  . Chronic airway obstruction, not elsewhere classified    "I was told I don't have this/tests done @ [facility] 05/2014"  . Chronic back pain   . Colon polyps   . Complication of anesthesia    "I've had recall; I probably stopped breathing at least 2 times" (09/03/2014)  . Dehydration   . Depressive disorder, not elsewhere classified   . Diabetic peripheral neuropathy ([place])   . Dysphagia 2007   historyof dysphagia with severe dysmotility by barium swallow-[name]  . Esophageal reflux   . Family history of adverse reaction to anesthesia    "my brother was told they lost him a couple times during OR"  . Heart murmur   . History of hiatal hernia   . Migraine    "couple times/month right now" (09/03/2014)  . Obesity, unspecified   . OSA (obstructive sleep apnea)     CPAP  . Septic shock ([place]) 08/30/2017  . Spondylosis of  unspecified site without mention of myelopathy   . Type II diabetes mellitus ([place])   . Unspecified essential hypertension   . Unspecified menopausal and postmenopausal disorder   . Unspecified vitamin D deficiency   . Upper airway cough syndrome     Patient Active Problem List   Diagnosis Date Noted  . DM neuropathy, type II diabetes mellitus ([place]) 11/18/2017  . Weakness 11/18/2017  . SIRS (systemic inflammatory response syndrome) ([place]) 11/17/2017  . Diabetes mellitus type 2 in obese ([place]) 11/17/2017  . Acute lower UTI 11/17/2017  . Septic shock ([place]) 08/30/2017  . Acute renal failure (ARF) ([place]) 08/13/2017  . Pressure injury of skin 08/13/2017  . Sepsis due to gram-negative UTI ([place]) 08/12/2017  . Upper airway cough syndrome 09/28/2016  . Acute bronchitis 10/29/2015  . Diabetes ([place]) 08/21/2015  . Chest pain 07/14/2015  . Dyspnea on exertion 07/05/2015  . Obstructive sleep apnea 07/05/2015  . Constipation 03/05/2015  . Allergic rhinitis 03/05/2015  . Hearing loss 03/05/2015  . Vocal cord dysfunction 03/05/2015  . Pain in joint, shoulder region 09/11/2014  . Weakness generalized 09/02/2014  . Acute esophagitis 05/08/2014  . Benign neoplasm of sigmoid colon 05/08/2014  . Change in bowel habits 04/17/2014  . Fecal incontinence 04/17/2014  . Urge incontinence 04/08/2014  .  Cough 12/06/2013  . Orthostasis 07/18/2013  . Acute kidney injury ([place]) 07/18/2013  . Bilateral carpal tunnel syndrome 02/14/2013  . Gout 01/04/2013  . Failure to thrive 06/28/2012  . Gait disorder 06/28/2012  . Lower abdominal pain 12/14/2011  . Preventative health care 08/23/2011  . Cervical radiculopathy 02/11/2011  . Low back pain 02/11/2011  . VITAMIN D DEFICIENCY 11/22/2009  . ANEMIA-NOS 11/22/2009  . DEGENERATIVE JOINT DISEASE, CERVICAL SPINE 11/22/2009  . Depression 09/16/2009  . POLYNEUROPATHY 09/16/2009  . Morbid obesity ([place]) 09/09/2009  . Essential hypertension 09/09/2009  . GERD  09/09/2009  . Sleep apnea 09/09/2009    Past Surgical History:  Procedure Laterality Date  . ABDOMINAL HYSTERECTOMY     "partial"  . ANTERIOR CERVICAL DECOMP/DISCECTOMY FUSION     multiple cervical spine levels;Dr. Arnoldo Morale  . APPENDECTOMY    . BACK SURGERY    . CHOLECYSTECTOMY OPEN    . COLONOSCOPY N/A 05/08/2014   Procedure: COLONOSCOPY;  Surgeon: [name], MD;  Location: [facility];  Service: Endoscopy;  Laterality: N/A;  . DILATION AND CURETTAGE OF UTERUS    . ESOPHAGOGASTRODUODENOSCOPY N/A 05/08/2014   Procedure: ESOPHAGOGASTRODUODENOSCOPY (EGD);  Surgeon: [name], MD;  Location: [name];  Service: Endoscopy;  Laterality: N/A;  . EXPLORATORY LAPAROTOMY    . EYE SURGERY    . FOOT SURGERY Right X 2   "took blood out of my arms and put platelets in my feet"  . INCISION AND DRAINAGE ABSCESS     Chest  . JOINT REPLACEMENT    . REFRACTIVE SURGERY Bilateral   . TONSILLECTOMY    . TOTAL KNEE ARTHROPLASTY Left 10/2003  . TOTAL KNEE ARTHROPLASTY Right 01/2004   [name]     OB History   None      Home Medications    Prior to Admission medications   Medication Sig Start Date End Date Taking? Authorizing Provider  albuterol (PROVENTIL) (2.5 MG/3ML) 0.083% nebulizer solution Take 2.5 mg by nebulization 4 (four) times daily as needed for wheezing or shortness of breath.    Yes [provider]  allopurinol (ZYLOPRIM) 300 MG tablet Take 300 mg by mouth daily.   Yes [provider]  aspirin EC 81 MG tablet Take 81 mg by mouth at bedtime.   Yes [provider]  Cholecalciferol (VITAMIN D-3) 1000 units CAPS Take 2,000 Units by mouth daily.   Yes [provider]  cyclobenzaprine (FLEXERIL) 10 MG tablet Take 10 mg by mouth daily. 12/06/17  Yes [provider]  cycloSPORINE (RESTASIS) 0.05 % ophthalmic emulsion Place 1 drop into both eyes 2 (two) times daily.    Yes [provider]  fluticasone (FLONASE) 50 MCG/ACT  nasal spray SHAKE LIQUID AND USE 2 SPRAYS IN EACH NOSTRIL DAILY 03/08/17  Yes Byrum, Rose Fillers, MD  folic acid (FOLVITE) 1 MG tablet Take 1 mg by mouth once a day 09/03/17  Yes Aline August, MD  gabapentin (NEURONTIN) 800 MG tablet Take 800 mg by mouth 3 (three) times daily.   Yes [provider]  Glucosamine-Chondroitin (GLUCOSAMINE CHONDR COMPLEX PO) Take 1 tablet by mouth daily.   Yes [provider]  HYDROcodone-acetaminophen (NORCO/VICODIN) 5-325 MG tablet Take 1 tablet by mouth every 6 (six) hours as needed. 12/09/17  Yes Maczis, Barth Kirks, PA-C  insulin NPH Human (NOVOLIN N RELION) 100 UNIT/ML injection Inject 1.2 mLs (120 Units total) into the skin every morning. 03/09/17  Yes Renato Shin, MD  ipratropium (ATROVENT) 0.03 %  nasal spray USE 2 SPRAYS IN EACH NOSTRIL THREE TIMES DAILY AS NEEDED FOR RHINITIS 03/17/17  Yes Collene Gobble, MD  irbesartan (AVAPRO) 300 MG tablet Take 300 mg by mouth daily.   Yes [provider]  loratadine (CLARITIN) 10 MG tablet Take 10 mg by mouth daily.   Yes [provider]  metFORMIN (GLUCOPHAGE) 500 MG tablet Take 1 tablet (500 mg total) by mouth 2 (two) times daily with a meal. Patient taking differently: Take 500 mg by mouth daily with breakfast.  11/22/17  Yes Rizwan, Eunice Blase, MD  metoprolol (LOPRESSOR) 50 MG tablet Take 75 mg by mouth 2 (two) times daily.   Yes [provider]  montelukast (SINGULAIR) 10 MG tablet TAKE 1 TABLET(10 MG) BY MOUTH DAILY 04/03/16  Yes Magdalen Spatz, NP  Multiple Vitamins-Minerals (MULTIVITAMIN WITH MINERALS) tablet Take 1 tablet by mouth daily.   Yes [provider]  nystatin cream (MYCOSTATIN) Apply 1 application topically 2 (two) times daily as needed for dry skin (skin irritation).   Yes [provider]  oxybutynin (DITROPAN-XL) 10 MG 24 hr tablet Take 1 tablet (10 mg total) by mouth daily. 02/18/15  Yes Biagio Borg, MD  pantoprazole (PROTONIX) 40 MG tablet TAKE 1  TABLET(40 MG) BY MOUTH TWICE DAILY 03/02/17  Yes Byrum, Rose Fillers, MD  predniSONE (DELTASONE) 10 MG tablet Take 2 tablets (20 mg total) by mouth daily with breakfast. 09/03/17  Yes Aline August, MD  ranitidine (ZANTAC) 300 MG tablet Take 1 tablet (300 mg total) by mouth 2 (two) times daily. 11/22/17  Yes Debbe Odea, MD  sodium chloride (OCEAN) 0.65 % SOLN nasal spray Place 2 sprays into both nostrils 2 (two) times daily.   Yes [provider]  traMADol (ULTRAM) 50 MG tablet Take 50 mg by mouth every 12 (twelve) hours as needed for moderate pain.   Yes [provider]  TURMERIC PO Take 1 tablet by mouth daily.   Yes [provider]  bisacodyl (DULCOLAX) 10 MG suppository Place 1 suppository (10 mg total) rectally daily. Patient not taking: Reported on 12/14/2017 11/23/17   Debbe Odea, MD  ciprofloxacin (CIPRO) 500 MG tablet Take 1 tablet (500 mg total) by mouth 2 (two) times daily. Patient not taking: Reported on 12/06/2017 11/22/17   Debbe Odea, MD  gabapentin (NEURONTIN) 400 MG capsule Take 1 capsule (400 mg total) by mouth 3 (three) times daily. Patient not taking: Reported on 12/14/2017 11/22/17   Debbe Odea, MD  Insulin Syringe-Needle U-100 (INSULIN SYRINGE 1CC/31GX5/16") 31G X 5/16" 1 ML MISC USE TO INJECT INSULIN TWICE DAILY 05/11/17   Renato Shin, MD  Lidocaine-Menthol (LIMENCIN) 4-4 % [facility] Apply 1 patch topically See admin instructions. Apply 1 patch daily to the right shoulder/remove each evening    [provider]  mupirocin ointment (BACTROBAN) 2 % Place 1 application into the nose 2 (two) times daily. Patient not taking: Reported on 12/14/2017 11/22/17   Debbe Odea, MD  ONE TOUCH LANCETS MISC Use to check sugars twice daily DX E11.22 08/23/17   Renato Shin, MD    Family History Family History  Problem Relation Age of Onset  . Esophageal cancer Brother   . Esophageal cancer Sister        ?  . Colon polyps Brother   . Pancreatic cancer Sister         ?  . Diabetes Sister   . Diabetes Unknown        Aunt and Uncle  .  Heart disease Maternal Grandfather     Social History Social History   Tobacco Use  . Smoking status: Former Smoker    Packs/day: 1.00    Years: 20.00    Pack years: 20.00    Types: Cigarettes    Last attempt to quit: 06/22/1986    Years since quitting: 31.5  . Smokeless tobacco: Never Used  Substance Use Topics  . Alcohol use: Yes  . Drug use: No     Allergies   Ace inhibitors; Penicillins; and Adhesive [tape]   Review of Systems Review of Systems  All other systems reviewed and are negative.    Physical Exam Updated Vital Signs BP (!) 135/58 (BP Location: Left Arm)   Pulse 78   Temp 98.6 F (37 C) (Oral)   Resp 19   Ht 1.626 m (5\' 4" )   Wt (!) 142.4 kg (314 lb)   SpO2 97%   BMI 53.90 kg/m   Physical Exam  Constitutional: No distress.  Obese  HENT:  Head: Normocephalic and atraumatic.  Right Ear: External ear normal.  Left Ear: External ear normal.  Eyes: Conjunctivae are normal. Right eye exhibits no discharge. Left eye exhibits no discharge. No scleral icterus.  Neck: Neck supple. No tracheal deviation present.  Cardiovascular: Normal rate, regular rhythm and intact distal pulses.  Pulmonary/Chest: Effort normal and breath sounds normal. No stridor. No respiratory distress. She has no wheezes. She has no rales.  Abdominal: Soft. Bowel sounds are normal. She exhibits no distension. There is no tenderness. There is no rebound and no guarding.  Musculoskeletal: She exhibits edema and tenderness.  Minor abrasion left knees, some bruising noted around the left knee; status post surgical scars bilateral knees  Neurological: She is alert. No cranial nerve deficit (no facial droop, extraocular movements intact, no slurred speech) or sensory deficit. She exhibits normal muscle tone. She displays no seizure activity. Coordination normal.  Generalized weakness, the patient is able to  plantarflex and grip equally bilaterally  Skin: Skin is warm and dry. No rash noted.  Psychiatric: She has a normal mood and affect.  Nursing note and vitals reviewed.    ED Treatments / Results  Labs (all labs ordered are listed, but only abnormal results are displayed) Labs Reviewed  CBC WITH DIFFERENTIAL/PLATELET - Abnormal; Notable for the following components:      Result Value   Hemoglobin 11.3 (*)    MCHC 29.5 (*)    All other components within normal limits  COMPREHENSIVE METABOLIC PANEL - Abnormal; Notable for the following components:   Glucose, Bld 179 (*)    BUN 25 (*)    Creatinine, Ser 1.20 (*)    Albumin 3.2 (*)    GFR calc non Af Amer 45 (*)    GFR calc Af Amer 53 (*)    All other components within normal limits  URINALYSIS, ROUTINE W REFLEX MICROSCOPIC - Abnormal; Notable for the following components:   Glucose, UA 150 (*)    All other components within normal limits  TROPONIN I    EKG EKG Interpretation  Date/Time:  Tuesday December 14 2017 15:35:27 EDT Ventricular Rate:  82 PR Interval:    QRS Duration: 89 QT Interval:  378 QTC Calculation: 442 R Axis:   1 Text Interpretation:  Sinus rhythm Abnormal R-wave progression, early transition , new since last tracing Confirmed by Dorie Rank [phone]) on 12/14/2017 3:38:19 PM   Radiology Mr Lumbar Spine Wo Contrast  Result Date: 12/14/2017 CLINICAL DATA:  Back pain with rapidly progressive neuro deficit. Increasing weakness. EXAM: MRI LUMBAR SPINE WITHOUT CONTRAST TECHNIQUE: Multiplanar, multisequence MR imaging of the lumbar spine was performed. No intravenous contrast was administered. COMPARISON:  MRI lumbar spine 07/14/2017 FINDINGS: Segmentation:  Normal Alignment:  Normal Vertebrae:  Negative for fracture or mass Conus medullaris and cauda equina: Conus extends to the L2 level. Conus and cauda equina appear normal. Paraspinal and other soft tissues: Negative for paraspinous mass or soft tissue thickening. Disc  levels: T12-L1: Mild disc and mild facet degeneration L1-2: Mild facet degeneration bilaterally without stenosis L2-3: Diffuse disc bulging. Moderate facet hypertrophy. Moderate spinal stenosis. Mild progression of stenosis since the prior study. L3-4: Moderate to severe spinal stenosis with progression from the prior study. Severe facet degeneration. 3 x 6 mm synovial cyst associated with the ligamentum flavum on the right is new since the prior study and contributes to stenosis. Moderate subarticular stenosis bilaterally L4-5: Severe spinal stenosis similar to the prior study. Moderately large broad-based disc protrusion central and left-sided also unchanged from the prior study. Marked subarticular stenosis on the left unchanged. L5-S1: Central disc protrusion and mild facet degeneration. Mild subarticular stenosis bilaterally. IMPRESSION: Moderate spinal stenosis L2-3 progression since prior MRI earlier this year Moderate to severe spinal stenosis L3-4 with progression. Severe facet degeneration with new synovial cyst on the right Severe spinal stenosis L4-5 unchanged. Moderately large broad-based central and left-sided disc protrusion unchanged. Marked subarticular stenosis on the left unchanged Small central disc protrusion L5-S1 unchanged. Electronically Signed   By: Franchot Gallo M.D.   On: 12/14/2017 21:08   Dg Chest Portable 1 View  Result Date: 12/14/2017 CLINICAL DATA:  Increasing chronic weakness resulting in multiple falls. EXAM: PORTABLE CHEST 1 VIEW COMPARISON:  11/17/2017. FINDINGS: The cardiac silhouette remains mildly enlarged. Clear lungs with normal vascularity. Mildly tortuous and calcified thoracic aorta. Cervical spine fixation hardware. Lower thoracic spine degenerative changes. IMPRESSION: No acute abnormality. Electronically Signed   By: Claudie Revering M.D.   On: 12/14/2017 17:32    Procedures Procedures (including critical care time)  Medications Ordered in ED Medications    morphine 4 MG/ML injection 4 mg (4 mg Intravenous Given 12/14/17 1736)  nystatin (MYCOSTATIN/NYSTOP) topical powder ( Topical Given 12/14/17 1932)     Initial Impression / Assessment and Plan / ED Course  I have reviewed the triage vital signs and the nursing notes.  Pertinent labs & imaging results that were available during my care of the patient were reviewed by me and considered in my medical decision making (see chart for details).  Clinical Course as of Dec 16 1429  Tue Dec 14, 2017  1552 Imaging test reviewed from visit on June 19   [JK]  1931 : 1. Degenerative change of the lumbar spine superimposed on borderline congenital canal narrowing. No acute osseous process. 2. Severe canal stenosis L4-5, LEFT lateral recess effacement likely affects the traversing LEFT L5 nerve. Mild canal stenosis L2-3 and L3-4. 3. Neural foraminal narrowing L2-3 through L5-S1: Moderate LEFT L4-5 neural foraminal narrowing.   Electronically Signed   By: Elon Alas M.D.   On: 07/14/2017 15:19   [JK]  1949 Patient is requesting nursing home placement.  I will consult social worker but explained to her am not sure that we will be able to arrange that from the emergency room.  Considering her increasing weakness I will order an MRI to make sure there are no acute changes in her known spinal stenosis.   [PP]  2316 Discussed with Dr Annette Stable.  He reviewed the films.  No indication for any acute intervention.  MRI findings could be contributing to her symptoms   [JK]    Clinical Course User Index [JK] Dorie Rank, MD    She presented to the emergency room for evaluation of increasing weakness and frequent falls.  Patient's laboratory tests are unremarkable.  She does have history of spinal stenosis so MRI was performed.  MRI showed some progression of her disease.  Case discussed with Dr Annette Stable.  No need for urgent intervention.  Pt states she is unable to stand.  Appreciate hospitalist input.  Admitted for observation. Final Clinical Impressions(s) / ED Diagnoses   Final diagnoses:  Fall  Spinal stenosis    Dorie Rank, MD 12/16/17 1433

## 2017-12-14 NOTE — ED Notes (Signed)
Pt back from MRI 

## 2017-12-14 NOTE — ED Notes (Signed)
Patient transported to MRI 

## 2017-12-14 NOTE — Clinical Social Work Note (Signed)
Clinical Social Work Assessment  Patient Details  Name: Christy May MRN: [id number] Date of Birth: 1951-04-21  Date of referral:  12/14/17               Reason for consult:  Facility Placement                Permission sought to share information with:  Case Manager Permission granted to share information::  Yes, Verbal Permission Granted  Name::        Agency::     Relationship::     Contact Information:     Housing/Transportation Living arrangements for the past 2 months:  Single Family Home Source of Information:  Patient Patient Interpreter Needed:  None Criminal Activity/Legal Involvement Pertinent to Current Situation/Hospitalization:  No - Comment as needed Significant Relationships:  Friend, [employer] Lives with:  Self Do you feel safe going back to the place where you live?  Yes Need for family participation in patient care:  Yes (Comment)  Care giving concerns:  CSW consulted for SNF placement.   Social Worker assessment / plan:  CSW met with pt at pt's bedside. Pt has Medicare as her primary insurance. Pt has not had a three night qualifying stay in the past 30 days. Pt was a patient at [facility] as an observation pt within the past 30 days, but that does not qualify for Medicare to pay for SNF. Pt currently has services through [facility] as a home health provider. Pt is followed by [facility] in the community. Pt gave CSW verbal permission to reach out to [facility] to see what additional services might be available for pt. Pt agreeable to returning home with continued home health and support from [facility].   Employment status:    Forensic scientist:  Medicare, Medicaid In [place] PT Recommendations:  Not assessed at this time Information / Referral to community resources:  Other (Comment Required)([facility])  Patient/Family's Response to care:  [facility] and [facility] medical workup. Pt is agreeable to transitioning home with home health and [facility]  support when medically cleared.   Patient/Family's Understanding of and Emotional Response to Diagnosis, Current Treatment, and Prognosis:  Pt did not have any questions for this CSW at this time.   Emotional Assessment Appearance:  Appears stated age Attitude/Demeanor/Rapport:    Affect (typically observed):  Accepting, Calm, Appropriate Orientation:  Oriented to Self, Oriented to Place, Oriented to  Time, Oriented to Situation Alcohol / Substance use:    Psych involvement (Current and /or in the community):  No (Comment)  Discharge Needs  Concerns to be addressed:  Discharge Planning Concerns Readmission within the last 30 days:  No Current discharge risk:  Lives alone Barriers to Discharge:  Continued Medical Work up   [employer], LCSW 12/14/2017, 8:58 PM

## 2017-12-15 ENCOUNTER — Encounter (HOSPITAL_COMMUNITY): Payer: Self-pay | Admitting: Internal Medicine

## 2017-12-15 ENCOUNTER — Observation Stay (HOSPITAL_COMMUNITY): Payer: Medicare Other

## 2017-12-15 DIAGNOSIS — M5440 Lumbago with sciatica, unspecified side: Secondary | ICD-10-CM

## 2017-12-15 DIAGNOSIS — E1169 Type 2 diabetes mellitus with other specified complication: Secondary | ICD-10-CM

## 2017-12-15 DIAGNOSIS — R531 Weakness: Secondary | ICD-10-CM

## 2017-12-15 DIAGNOSIS — M25561 Pain in right knee: Secondary | ICD-10-CM | POA: Diagnosis not present

## 2017-12-15 DIAGNOSIS — E669 Obesity, unspecified: Secondary | ICD-10-CM | POA: Diagnosis not present

## 2017-12-15 DIAGNOSIS — S8991XA Unspecified injury of right lower leg, initial encounter: Secondary | ICD-10-CM | POA: Diagnosis not present

## 2017-12-15 DIAGNOSIS — R102 Pelvic and perineal pain: Secondary | ICD-10-CM | POA: Diagnosis not present

## 2017-12-15 DIAGNOSIS — M48061 Spinal stenosis, lumbar region without neurogenic claudication: Secondary | ICD-10-CM | POA: Diagnosis not present

## 2017-12-15 DIAGNOSIS — S3993XA Unspecified injury of pelvis, initial encounter: Secondary | ICD-10-CM | POA: Diagnosis not present

## 2017-12-15 LAB — TROPONIN I
Troponin I: <0.03 ng/mL (ref <0.03)
Troponin I: <0.03 ng/mL (ref <0.03)

## 2017-12-15 LAB — CBC
HEMATOCRIT: 33.2 % — AB (ref 36.0–46.0)
Hemoglobin: 10.1 g/dL — ABNORMAL LOW (ref 12.0–15.0)
MCH: 28.5 pg (ref 26.0–34.0)
MCHC: 30.4 g/dL (ref 30.0–36.0)
MCV: 93.8 fL (ref 78.0–100.0)
Platelets: 207 10*3/uL (ref 150–400)
RBC: 3.54 MIL/uL — ABNORMAL LOW (ref 3.87–5.11)
RDW: 15.5 % (ref 11.5–15.5)
WBC: 9.7 10*3/uL (ref 4.0–10.5)

## 2017-12-15 LAB — MRSA PCR SCREENING: MRSA by PCR: POSITIVE — AB

## 2017-12-15 LAB — BASIC METABOLIC PANEL
Anion gap: 11 (ref 5–15)
BUN: 20 mg/dL (ref 8–23)
CHLORIDE: 102 mmol/L (ref 98–111)
CO2: 28 mmol/L (ref 22–32)
Calcium: 9.1 mg/dL (ref 8.9–10.3)
Creatinine, Ser: 1.08 mg/dL — ABNORMAL HIGH (ref 0.44–1.00)
GFR calc Af Amer: 60 mL/min — ABNORMAL LOW (ref >60)
GFR calc non Af Amer: 52 mL/min — ABNORMAL LOW (ref >60)
GLUCOSE: 197 mg/dL — AB (ref 70–99)
POTASSIUM: 3.9 mmol/L (ref 3.5–5.1)
Sodium: 141 mmol/L (ref 135–145)

## 2017-12-15 LAB — GLUCOSE, CAPILLARY
GLUCOSE-CAPILLARY: 205 mg/dL — AB (ref 70–99)
GLUCOSE-CAPILLARY: 138 mg/dL — AB (ref 70–99)
Glucose-Capillary: 134 mg/dL — ABNORMAL HIGH (ref 70–99)
Glucose-Capillary: 226 mg/dL — ABNORMAL HIGH (ref 70–99)

## 2017-12-15 LAB — D-DIMER, QUANTITATIVE (NOT AT ARMC): D DIMER QUANT: 2.18 ug{FEU}/mL — AB (ref 0.00–0.50)

## 2017-12-15 MED ORDER — PANTOPRAZOLE SODIUM 40 MG PO TBEC
40.0000 mg | DELAYED_RELEASE_TABLET | Freq: Every day | ORAL | Status: DC
  Administered 2017-12-15 – 2017-12-17 (×3): 40 mg via ORAL
  Filled 2017-12-15 (×3): qty 1

## 2017-12-15 MED ORDER — GABAPENTIN 400 MG PO CAPS
800.0000 mg | ORAL_CAPSULE | Freq: Three times a day (TID) | ORAL | Status: DC
Start: 2017-12-15 — End: 2017-12-17
  Administered 2017-12-15 – 2017-12-17 (×7): 800 mg via ORAL
  Filled 2017-12-15 (×7): qty 2

## 2017-12-15 MED ORDER — ALLOPURINOL 300 MG PO TABS
300.0000 mg | ORAL_TABLET | Freq: Every day | ORAL | Status: DC
  Administered 2017-12-15 – 2017-12-17 (×3): 300 mg via ORAL
  Filled 2017-12-15 (×3): qty 1

## 2017-12-15 MED ORDER — ONDANSETRON HCL 4 MG PO TABS: 4.0000 mg | ORAL_TABLET | Freq: Four times a day (QID) | ORAL | Status: DC | PRN

## 2017-12-15 MED ORDER — ACETAMINOPHEN 325 MG PO TABS: 650.0000 mg | ORAL_TABLET | Freq: Four times a day (QID) | ORAL | Status: DC | PRN

## 2017-12-15 MED ORDER — LIDOCAINE 5 % EX PTCH
1.0000 | MEDICATED_PATCH | Freq: Every day | CUTANEOUS | Status: DC
  Administered 2017-12-15 – 2017-12-17 (×3): 1 via TRANSDERMAL
  Filled 2017-12-15 (×3): qty 1

## 2017-12-15 MED ORDER — ALBUTEROL SULFATE (2.5 MG/3ML) 0.083% IN NEBU: 2.5000 mg | INHALATION_SOLUTION | Freq: Four times a day (QID) | RESPIRATORY_TRACT | Status: DC | PRN

## 2017-12-15 MED ORDER — FAMOTIDINE 20 MG PO TABS
20.0000 mg | ORAL_TABLET | Freq: Two times a day (BID) | ORAL | Status: DC
  Administered 2017-12-15 – 2017-12-17 (×6): 20 mg via ORAL
  Filled 2017-12-15 (×6): qty 1

## 2017-12-15 MED ORDER — HYDRALAZINE HCL 20 MG/ML IJ SOLN
10.0000 mg | INTRAMUSCULAR | Status: DC | PRN
Start: 2017-12-15 — End: 2017-12-17

## 2017-12-15 MED ORDER — ADULT MULTIVITAMIN W/MINERALS CH
1.0000 | ORAL_TABLET | Freq: Every day | ORAL | Status: DC
  Administered 2017-12-15 – 2017-12-17 (×3): 1 via ORAL
  Filled 2017-12-15 (×3): qty 1

## 2017-12-15 MED ORDER — HYDROCODONE-ACETAMINOPHEN 5-325 MG PO TABS
1.0000 | ORAL_TABLET | Freq: Four times a day (QID) | ORAL | Status: DC | PRN
  Administered 2017-12-15 – 2017-12-17 (×6): 1 via ORAL
  Filled 2017-12-15 (×6): qty 1

## 2017-12-15 MED ORDER — OXYBUTYNIN CHLORIDE ER 10 MG PO TB24
10.0000 mg | ORAL_TABLET | Freq: Every day | ORAL | Status: DC
  Administered 2017-12-15 – 2017-12-17 (×3): 10 mg via ORAL
  Filled 2017-12-15 (×3): qty 1

## 2017-12-15 MED ORDER — INSULIN NPH (HUMAN) (ISOPHANE) 100 UNIT/ML ~~LOC~~ SUSP
120.0000 [IU] | SUBCUTANEOUS | Status: DC
  Administered 2017-12-15 – 2017-12-17 (×3): 120 [IU] via SUBCUTANEOUS
  Filled 2017-12-15: qty 10

## 2017-12-15 MED ORDER — FOLIC ACID 1 MG PO TABS
1.0000 mg | ORAL_TABLET | Freq: Every day | ORAL | Status: DC
  Administered 2017-12-15 – 2017-12-17 (×3): 1 mg via ORAL
  Filled 2017-12-15 (×3): qty 1

## 2017-12-15 MED ORDER — CYCLOBENZAPRINE HCL 10 MG PO TABS
10.0000 mg | ORAL_TABLET | Freq: Three times a day (TID) | ORAL | Status: DC | PRN
  Administered 2017-12-15 – 2017-12-16 (×3): 10 mg via ORAL
  Filled 2017-12-15 (×3): qty 1

## 2017-12-15 MED ORDER — ACETAMINOPHEN 650 MG RE SUPP: 650.0000 mg | Freq: Four times a day (QID) | RECTAL | Status: DC | PRN

## 2017-12-15 MED ORDER — ASPIRIN EC 81 MG PO TBEC
81.0000 mg | DELAYED_RELEASE_TABLET | Freq: Every day | ORAL | Status: DC
  Administered 2017-12-15 – 2017-12-16 (×2): 81 mg via ORAL
  Filled 2017-12-15 (×2): qty 1

## 2017-12-15 MED ORDER — TRAMADOL HCL 50 MG PO TABS
50.0000 mg | ORAL_TABLET | Freq: Two times a day (BID) | ORAL | Status: DC | PRN
  Administered 2017-12-16: 50 mg via ORAL
  Filled 2017-12-15 (×2): qty 1

## 2017-12-15 MED ORDER — ENOXAPARIN SODIUM 80 MG/0.8ML ~~LOC~~ SOLN
70.0000 mg | SUBCUTANEOUS | Status: DC
  Administered 2017-12-15 – 2017-12-17 (×3): 70 mg via SUBCUTANEOUS
  Filled 2017-12-15 (×3): qty 0.8

## 2017-12-15 MED ORDER — METOPROLOL TARTRATE 25 MG PO TABS
75.0000 mg | ORAL_TABLET | Freq: Two times a day (BID) | ORAL | Status: DC
  Administered 2017-12-15 – 2017-12-17 (×6): 75 mg via ORAL
  Filled 2017-12-15 (×6): qty 1

## 2017-12-15 MED ORDER — GABAPENTIN 800 MG PO TABS
800.0000 mg | ORAL_TABLET | Freq: Three times a day (TID) | ORAL | Status: DC
  Filled 2017-12-15 (×2): qty 1

## 2017-12-15 MED ORDER — CHLORHEXIDINE GLUCONATE CLOTH 2 % EX PADS
6.0000 | MEDICATED_PAD | Freq: Every day | CUTANEOUS | Status: DC
  Administered 2017-12-15 – 2017-12-17 (×3): 6 via TOPICAL

## 2017-12-15 MED ORDER — PREDNISONE 20 MG PO TABS
20.0000 mg | ORAL_TABLET | Freq: Every day | ORAL | Status: DC
  Administered 2017-12-15 – 2017-12-17 (×3): 20 mg via ORAL
  Filled 2017-12-15 (×3): qty 1

## 2017-12-15 MED ORDER — CYCLOSPORINE 0.05 % OP EMUL
1.0000 [drp] | Freq: Two times a day (BID) | OPHTHALMIC | Status: DC
  Administered 2017-12-15 – 2017-12-17 (×5): 1 [drp] via OPHTHALMIC
  Filled 2017-12-15 (×6): qty 1

## 2017-12-15 MED ORDER — LORATADINE 10 MG PO TABS
10.0000 mg | ORAL_TABLET | Freq: Every day | ORAL | Status: DC
  Administered 2017-12-15 – 2017-12-17 (×3): 10 mg via ORAL
  Filled 2017-12-15 (×3): qty 1

## 2017-12-15 MED ORDER — MONTELUKAST SODIUM 10 MG PO TABS
10.0000 mg | ORAL_TABLET | Freq: Every day | ORAL | Status: DC
  Administered 2017-12-15 – 2017-12-16 (×2): 10 mg via ORAL
  Filled 2017-12-15 (×2): qty 1

## 2017-12-15 MED ORDER — IRBESARTAN 300 MG PO TABS: 300.0000 mg | ORAL_TABLET | Freq: Every day | ORAL | Status: DC

## 2017-12-15 MED ORDER — FLUTICASONE PROPIONATE 50 MCG/ACT NA SUSP
1.0000 | Freq: Every day | NASAL | Status: DC
  Administered 2017-12-15: 1 via NASAL
  Filled 2017-12-15: qty 16

## 2017-12-15 MED ORDER — SODIUM CHLORIDE 0.9 % IV SOLN
INTRAVENOUS | Status: DC
  Administered 2017-12-15: 11:00:00 via INTRAVENOUS

## 2017-12-15 MED ORDER — MUPIROCIN 2 % EX OINT
1.0000 "application " | TOPICAL_OINTMENT | Freq: Two times a day (BID) | CUTANEOUS | Status: DC
  Administered 2017-12-15 – 2017-12-17 (×6): 1 via NASAL
  Filled 2017-12-15: qty 22

## 2017-12-15 MED ORDER — INSULIN ASPART 100 UNIT/ML ~~LOC~~ SOLN
0.0000 [IU] | Freq: Three times a day (TID) | SUBCUTANEOUS | Status: DC
  Administered 2017-12-15: 1 [IU] via SUBCUTANEOUS
  Administered 2017-12-15 – 2017-12-16 (×4): 3 [IU] via SUBCUTANEOUS
  Administered 2017-12-16: 2 [IU] via SUBCUTANEOUS

## 2017-12-15 MED ORDER — VITAMIN D 1000 UNITS PO TABS
2000.0000 [IU] | ORAL_TABLET | Freq: Every day | ORAL | Status: DC
  Administered 2017-12-15 – 2017-12-17 (×3): 2000 [IU] via ORAL
  Filled 2017-12-15 (×3): qty 2

## 2017-12-15 MED ORDER — ONDANSETRON HCL 4 MG/2ML IJ SOLN: 4.0000 mg | Freq: Four times a day (QID) | INTRAMUSCULAR | Status: DC | PRN

## 2017-12-15 NOTE — Evaluation (Signed)
Occupational Therapy Evaluation Patient Details Name: Christy May MRN: [id number] DOB: 30-May-1951 Today's Date: 12/15/2017    History of Present Illness Pt is a 67 y.o. female presenting with increased LE weakness and low back pain. PMHx: DM2, Essential HTN, Anemia, Gout, and recent hospitalization for complicated UTI.   Clinical Impression   Pt reports she required assist for ADL PTA with significant hx of falls at home. Pt currently requires mod assist +2 for few steps at EOB and min-max assist for ADL. Pt presenting with generalized weakness (LEs>UEs) and pain impacting her independence and safety with ADL and functional mobility. Recommending SNF for follow up to maximize independence and safety with ADL and functional mobility prior to return home. Pt would benefit from continued skilled OT to address established goals.    Follow Up Recommendations  SNF;Supervision/Assistance - 24 hour    Equipment Recommendations  None recommended by OT    Recommendations for Other Services       Precautions / Restrictions Precautions Precautions: Fall Restrictions Weight Bearing Restrictions: No      Mobility Bed Mobility Overal bed mobility: Needs Assistance Bed Mobility: Supine to Sit;Sit to Supine     Supine to sit: Mod assist Sit to supine: Mod assist   General bed mobility comments: Assist for LEs and HHA for trunk elevation to sitting. Pt able to scoot hips out to EOB. HOB flat with use of bed rails. Increased time and effort throughout  Transfers Overall transfer level: Needs assistance Equipment used: Rolling walker (2 wheeled) Transfers: Sit to/from Stand Sit to Stand: Min assist;+2 physical assistance;From elevated surface         General transfer comment: Min assist +2 to boost up from EOB and for steadying in standing. Mod assist +2 for side stepping toward [facility] for repositioning. Bed elevated for sit to stand    Balance Overall balance assessment: Needs  assistance Sitting-balance support: Feet supported Sitting balance-Leahy Scale: Good     Standing balance support: Bilateral upper extremity supported Standing balance-Leahy Scale: Poor                             ADL either performed or assessed with clinical judgement   ADL Overall ADL's : Needs assistance/impaired Eating/Feeding: Set up;Sitting   Grooming: Min guard;Sitting   Upper Body Bathing: Minimal assistance;Sitting   Lower Body Bathing: Maximal assistance;Sit to/from stand   Upper Body Dressing : Minimal assistance;Sitting   Lower Body Dressing: Maximal assistance;Sit to/from stand       Toileting- Water quality scientist and Hygiene: Total assistance;Sit to/from stand Toileting - Clothing Manipulation Details (indicate cue type and reason): for peri care in standing     Functional mobility during ADLs: Moderate assistance;+2 for physical assistance;Rolling walker(for side steps at EOB)       Vision         Perception     Praxis      Pertinent Vitals/Pain Pain Assessment: Faces Faces Pain Scale: Hurts little more Pain Location: R arm and leg Pain Descriptors / Indicators: Aching;Discomfort Pain Intervention(s): Monitored during session;Limited activity within patient's tolerance     Hand Dominance Right   Extremity/Trunk Assessment Upper Extremity Assessment Upper Extremity Assessment: Generalized weakness   Lower Extremity Assessment Lower Extremity Assessment: Defer to PT evaluation       Communication Communication Communication: No difficulties   Cognition Arousal/Alertness: Awake/alert Behavior During Therapy: WFL for tasks assessed/performed Overall Cognitive Status: Within Functional Limits for  tasks assessed                                     General Comments       Exercises     Shoulder Instructions      Home Living Family/patient expects to be discharged to:: Private residence Living  Arrangements: Alone Available Help at Discharge: Personal care attendant;Available PRN/intermittently;Family;Friend(s) Type of Home: House Home Access: Stairs to enter [employer] of Steps: 1   Home Layout: One level     Bathroom Shower/Tub: Teacher, early years/pre: Handicapped [facility] bed;Walker - 4 wheels;Bedside commode;Wheelchair - [employer] - 2 wheels;Cane - single point;Tub bench   Additional Comments: Aide and RN x2 per week. HH PT and OT      Prior Functioning/Environment Level of Independence: Needs assistance  Gait / Transfers Assistance Needed: Pt reports shes uses w/c for most of her mobility.  ADL's / Homemaking Assistance Needed: Aide assists with bathing and dressing            OT Problem List: Decreased strength;Decreased activity tolerance;Impaired balance (sitting and/or standing);Decreased knowledge of use of DME or AE;Obesity;Pain;Increased edema      OT Treatment/Interventions: Self-care/ADL training;Therapeutic exercise;Energy conservation;DME and/or AE instruction;Therapeutic activities;Patient/family education;Balance training    OT Goals(Current goals can be found in the care plan section) Acute Rehab OT Goals Patient Stated Goal: be able to walk again OT Goal Formulation: With patient Time For Goal Achievement: 12/29/17 Potential to Achieve Goals: Good  OT Frequency: Min 2X/week   Barriers to D/C: Decreased caregiver support  pt lives alone       Co-evaluation PT/OT/SLP Co-Evaluation/Treatment: Yes Reason for Co-Treatment: For patient/therapist safety   OT goals addressed during session: Strengthening/ROM      AM-PAC PT "6 Clicks" Daily Activity     Outcome Measure Help from another person eating meals?: A Little Help from another person taking care of personal grooming?: A Little Help from another person toileting, which includes using toliet, bedpan, or urinal?: A Lot Help from  another person bathing (including washing, rinsing, drying)?: A Lot Help from another person to put on and taking off regular upper body clothing?: A Little Help from another person to put on and taking off regular lower body clothing?: A Lot 6 Click Score: 15   End of Session Equipment Utilized During Treatment: Gait belt;Rolling walker  Activity Tolerance: Patient tolerated treatment well;Patient limited by pain Patient left: in bed;with call bell/phone within reach  OT Visit Diagnosis: Unsteadiness on feet (R26.81);Repeated falls (R29.6);Muscle weakness (generalized) (M62.81);Pain                Time: 1453-1515 OT Time Calculation (min): 22 min Charges:  OT General Charges $OT Visit: 1 Visit OT Evaluation $OT Eval Moderate Complexity: 1 Mod G-Codes:     Breia Ocampo A. Ulice Brilliant, M.S., OTR/L Acute Rehab Department: [phone]  Binnie Kand 12/15/2017, 3:54 PM

## 2017-12-15 NOTE — Progress Notes (Signed)
Woke pt up to see if she was ready for CPAP. Pt states she is not ready at this time. Pt to notify for RT when she wants mask on for the night. RN aware

## 2017-12-15 NOTE — Progress Notes (Signed)
CSW and CSW in training spoke with patient regarding SNF placement. CSW explained that patient does not meet Medicare requirements for placement and she would need to be placed under her Medicaid, which would mean signing over her social security check for the month of July. Patient stated that she is unable to do so because she has bills to pay. She is in agreement with [facility] sending extra aides to her house with home health. CSW spoke with [facility]. They stated that patient had already called them a few days ago to try to go there again for rehab (she discharged from them in March). They told her that she had to have a 3 night inpatient stay, so she came to the hospital. [facility] has offered to keep the patient long term under her Medicaid but patient has always refused. CSW to contact [facility] to set patient up.   Percell Locus Benjie Ricketson LCSW [phone]

## 2017-12-15 NOTE — Evaluation (Signed)
Physical Therapy Evaluation Patient Details Name: Christy May MRN: [id number] DOB: September 16, 1950 Today's Date: 12/15/2017   History of Present Illness  Pt is a 67 y.o. female presenting with increased LE weakness and low back pain. PMHx: DM2, Essential HTN, Anemia, Gout, and recent hospitalization for complicated UTI.  Clinical Impression  Orders received for PT evaluation. Patient demonstrates deficits in functional mobility as indicated below. Will benefit from continued skilled PT to address deficits and maximize function. Will see as indicated and progress as tolerated.  Patient is extremely motivated to return to functional levels of mobility. Patient was able to tolerate activity but remains limited by RLE deficits and overall weakness. Feel patient will benefit from [facility] upon acute discharge. Patient in agreement.    Follow Up Recommendations SNF    Equipment Recommendations  None recommended by PT    Recommendations for Other Services       Precautions / Restrictions Precautions Precautions: Fall Restrictions Weight Bearing Restrictions: No      Mobility  Bed Mobility Overal bed mobility: Needs Assistance Bed Mobility: Supine to Sit;Sit to Supine     Supine to sit: Mod assist Sit to supine: Mod assist   General bed mobility comments: Assist for LEs and HHA for trunk elevation to sitting. Pt able to scoot hips out to EOB. HOB flat with use of bed rails. Increased time and effort throughout  Transfers Overall transfer level: Needs assistance Equipment used: Rolling walker (2 wheeled) Transfers: Sit to/from Stand Sit to Stand: Min assist;+2 physical assistance;From elevated surface         General transfer comment: Min assist +2 to boost up from EOB and for steadying in standing. Mod assist +2 for side stepping toward [facility] for repositioning. Bed elevated for sit to stand  Ambulation/Gait                Stairs            Wheelchair Mobility     Modified Rankin (Stroke Patients Only)       Balance Overall balance assessment: Needs assistance Sitting-balance support: Feet supported Sitting balance-Leahy Scale: Good     Standing balance support: Bilateral upper extremity supported Standing balance-Leahy Scale: Poor                               Pertinent Vitals/Pain Pain Assessment: Faces Faces Pain Scale: Hurts little more Pain Location: R arm and leg Pain Descriptors / Indicators: Aching;Discomfort Pain Intervention(s): Monitored during session;Limited activity within patient's tolerance    Home Living Family/patient expects to be discharged to:: Private residence Living Arrangements: Alone Available Help at Discharge: Personal care attendant;Available PRN/intermittently;Family;Friend(s) Type of Home: House Home Access: Stairs to enter   [employer] of Steps: 1 Home Layout: One level Home Equipment: Hospital bed;Walker - 4 wheels;Bedside commode;Wheelchair - [employer] - 2 wheels;Cane - single point;Tub bench Additional Comments: Aide and RN x2 per week. HH PT and OT    Prior Function Level of Independence: Needs assistance   Gait / Transfers Assistance Needed: Pt reports shes uses w/c for most of her mobility.   ADL's / Homemaking Assistance Needed: Aide assists with bathing and dressing        Hand Dominance   Dominant Hand: Right    Extremity/Trunk Assessment   Upper Extremity Assessment Upper Extremity Assessment: Generalized weakness    Lower Extremity Assessment Lower Extremity Assessment: Generalized weakness;RLE deficits/detail RLE Deficits / Details:  noted assymetrical weakness RLE(bilateral LE edema) RLE Sensation: decreased light touch RLE Coordination: decreased fine motor;decreased gross motor       Communication   Communication: No difficulties  Cognition Arousal/Alertness: Awake/alert Behavior During Therapy: WFL for tasks assessed/performed Overall  Cognitive Status: Within Functional Limits for tasks assessed                                        General Comments      Exercises     Assessment/Plan    PT Assessment Patient needs continued PT services  PT Problem List Decreased strength;Decreased activity tolerance;Decreased balance;Decreased mobility;Decreased coordination;Obesity;Pain       PT Treatment Interventions DME instruction;Gait training;Functional mobility training;Therapeutic activities;Therapeutic exercise;Balance training;Cognitive remediation;Patient/family education    PT Goals (Current goals can be found in the Care Plan section)  Acute Rehab PT Goals Patient Stated Goal: be able to walk again PT Goal Formulation: With patient Time For Goal Achievement: 12/29/17 Potential to Achieve Goals: Good    Frequency Min 2X/week   Barriers to discharge        Co-evaluation PT/OT/SLP Co-Evaluation/Treatment: Yes Reason for Co-Treatment: For patient/therapist safety PT goals addressed during session: Mobility/safety with mobility OT goals addressed during session: Strengthening/ROM       AM-PAC PT "6 Clicks" Daily Activity  Outcome Measure Difficulty turning over in bed (including adjusting bedclothes, sheets and blankets)?: A Little Difficulty moving from lying on back to sitting on the side of the bed? : Unable Difficulty sitting down on and standing up from a chair with arms (e.g., wheelchair, bedside commode, etc,.)?: Unable Help needed moving to and from a bed to chair (including a wheelchair)?: A Lot Help needed walking in hospital room?: A Lot Help needed climbing 3-5 steps with a railing? : Total 6 Click Score: 10    End of Session Equipment Utilized During Treatment: Gait belt Activity Tolerance: Patient tolerated treatment well Patient left: in bed;with call bell/phone within reach;with bed alarm set Nurse Communication: Mobility status PT Visit Diagnosis: Difficulty in  walking, not elsewhere classified (R26.2);Muscle weakness (generalized) (M62.81)    Time: 9924-2683 PT Time Calculation (min) (ACUTE ONLY): 22 min   Charges:   PT Evaluation $PT Eval Moderate Complexity: 1 Mod     PT G Codes:        Alben Deeds, PT DPT  Board Certified Neurologic Specialist [place] 12/15/2017, 5:38 PM

## 2017-12-15 NOTE — H&P (Addendum)
History and Physical    Christy May XNA:[id number] DOB: [birth year] DOA: 12/14/2017  PCP: Lucianne Lei, MD  Patient coming from: Home.  Chief Complaint: Weakness and low back pain.  HPI: Christy May is a 67 y.o. female with history of diabetes mellitus type 2, hypertension, anemia, gout, chronic steroid therapy for airway syndrome who was recently admitted 3 weeks ago for comp located UTI and weakness presents to the ER because of increasing weakness and low back pain.  Patient states she has been having multiple falls and has found it increasingly difficult to ambulate because of the low back pain and numbness of the right lower extremity.  Denies losing consciousness or hitting her head.  Has had some chest pain when she moves around.  Otherwise chest pain-free.  Denies any shortness of breath.  ED Course: In the ER on exam patient is finding it difficult to move right lower extremity from the pain.  MRI of the L-spine done shows progression of her lumbar stenosis which ER physician had discussed with on-call neurosurgeon Dr. Annette Stable moderately built and nourished.  Who advised her patient can follow-up as outpatient and get physical therapy consult.   Review of Systems: As per HPI, rest all negative.   Past Medical History:  Diagnosis Date  . Allergy   . Anemia, unspecified   . Anxiety   . Arthritis    "neck, back, hands" (09/03/2014)  . Chronic airway obstruction, not elsewhere classified    "I was told I don't have this/tests done @ [facility] 05/2014"  . Chronic back pain   . Colon polyps   . Complication of anesthesia    "I've had recall; I probably stopped breathing at least 2 times" (09/03/2014)  . Dehydration   . Depressive disorder, not elsewhere classified   . Diabetic peripheral neuropathy ([place])   . Dysphagia 2007   historyof dysphagia with severe dysmotility by barium swallow-[name]  . Esophageal reflux   . Family history of adverse reaction to anesthesia      "my brother was told they lost him a couple times during OR"  . Heart murmur   . History of hiatal hernia   . Migraine    "couple times/month right now" (09/03/2014)  . Obesity, unspecified   . OSA (obstructive sleep apnea)     CPAP  . Septic shock ([place]) 08/30/2017  . Spondylosis of unspecified site without mention of myelopathy   . Type II diabetes mellitus ([place])   . Unspecified essential hypertension   . Unspecified menopausal and postmenopausal disorder   . Unspecified vitamin D deficiency   . Upper airway cough syndrome     Past Surgical History:  Procedure Laterality Date  . ABDOMINAL HYSTERECTOMY     "partial"  . ANTERIOR CERVICAL DECOMP/DISCECTOMY FUSION     multiple cervical spine levels;Dr. Arnoldo Morale  . APPENDECTOMY    . BACK SURGERY    . CHOLECYSTECTOMY OPEN    . COLONOSCOPY N/A 05/08/2014   Procedure: COLONOSCOPY;  Surgeon: [name], MD;  Location: [facility] END[facility]COPY;  Service: Endoscopy;  Laterality: N/A;  . DILATION AND CURETTAGE OF UTERUS    . ESOPHAGOGASTRODUODENOSCOPY N/A 05/08/2014   Procedure: ESOPHAGOGASTRODUODENOSCOPY (EGD);  Surgeon: [name], MD;  Location: [name];  Service: Endoscopy;  Laterality: N/A;  . EXPLORATORY LAPAROTOMY    . EYE SURGERY    . FOOT SURGERY Right X 2   "took blood out of my arms and put platelets in my feet"  .  INCISION AND DRAINAGE ABSCESS     Chest  . JOINT REPLACEMENT    . REFRACTIVE SURGERY Bilateral   . TONSILLECTOMY    . TOTAL KNEE ARTHROPLASTY Left 10/2003  . TOTAL KNEE ARTHROPLASTY Right 01/2004   [name]     reports that she quit smoking about 31 years ago. Her smoking use included cigarettes. She has a 20.00 pack-year smoking history. She has never used smokeless tobacco. She reports that she drinks alcohol. She reports that she does not use drugs.  Allergies  Allergen Reactions  . Ace Inhibitors Anaphylaxis and Cough  . Penicillins Hives and Other (See Comments)    Tolerated ceftriaxone in 2019  Has  patient had a PCN reaction causing immediate rash, facial/tongue/throat swelling, SOB or lightheadedness with hypotension: Yes Has patient had a PCN reaction causing severe rash involving mucus membranes or skin necrosis:  No Has patient had a PCN reaction that required hospitalization: No Has patient had a PCN reaction occurring within the last 10 years: No If all of the above answers are "NO", then may proceed with Cephalosporin use.  . Adhesive [Tape] Hives and Rash    Family History  Problem Relation Age of Onset  . Esophageal cancer Brother   . Esophageal cancer Sister        ?  . Colon polyps Brother   . Pancreatic cancer Sister        ?  . Diabetes Sister   . Diabetes Unknown        Aunt and Uncle  . Heart disease Maternal Grandfather     Prior to Admission medications   Medication Sig Start Date End Date Taking? Authorizing Provider  albuterol (PROVENTIL) (2.5 MG/3ML) 0.083% nebulizer solution Take 2.5 mg by nebulization 4 (four) times daily as needed for wheezing or shortness of breath.    Yes [provider]  allopurinol (ZYLOPRIM) 300 MG tablet Take 300 mg by mouth daily.   Yes [provider]  aspirin EC 81 MG tablet Take 81 mg by mouth at bedtime.   Yes [provider]  Cholecalciferol (VITAMIN D-3) 1000 units CAPS Take 2,000 Units by mouth daily.   Yes [provider]  cyclobenzaprine (FLEXERIL) 10 MG tablet Take 10 mg by mouth daily. 12/06/17  Yes [provider]  cycloSPORINE (RESTASIS) 0.05 % ophthalmic emulsion Place 1 drop into both eyes 2 (two) times daily.    Yes [provider]  fluticasone (FLONASE) 50 MCG/ACT nasal spray SHAKE LIQUID AND USE 2 SPRAYS IN EACH NOSTRIL DAILY 03/08/17  Yes Byrum, Rose Fillers, MD  folic acid (FOLVITE) 1 MG tablet Take 1 mg by mouth once a day 09/03/17  Yes Aline August, MD  gabapentin (NEURONTIN) 800 MG tablet Take 800 mg by mouth 3 (three) times daily.   Yes [provider]   Glucosamine-Chondroitin (GLUCOSAMINE CHONDR COMPLEX PO) Take 1 tablet by mouth daily.   Yes [provider]  HYDROcodone-acetaminophen (NORCO/VICODIN) 5-325 MG tablet Take 1 tablet by mouth every 6 (six) hours as needed. 12/09/17  Yes Maczis, Barth Kirks, PA-C  insulin NPH Human (NOVOLIN N RELION) 100 UNIT/ML injection Inject 1.2 mLs (120 Units total) into the skin every morning. 03/09/17  Yes Renato Shin, MD  ipratropium (ATROVENT) 0.03 % nasal spray USE 2 SPRAYS IN EACH NOSTRIL THREE TIMES DAILY AS NEEDED FOR RHINITIS 03/17/17  Yes Collene Gobble, MD  irbesartan (AVAPRO) 300 MG tablet Take 300 mg by mouth daily.   Yes [provider]  loratadine (CLARITIN) 10 MG tablet Take 10 mg by mouth daily.   Yes [provider]  metFORMIN (GLUCOPHAGE) 500 MG tablet Take 1 tablet (500 mg total) by mouth 2 (two) times daily with a meal. Patient taking differently: Take 500 mg by mouth daily with breakfast.  11/22/17  Yes Rizwan, Eunice Blase, MD  metoprolol (LOPRESSOR) 50 MG tablet Take 75 mg by mouth 2 (two) times daily.   Yes [provider]  montelukast (SINGULAIR) 10 MG tablet TAKE 1 TABLET(10 MG) BY MOUTH DAILY 04/03/16  Yes Magdalen Spatz, NP  Multiple Vitamins-Minerals (MULTIVITAMIN WITH MINERALS) tablet Take 1 tablet by mouth daily.   Yes [provider]  nystatin cream (MYCOSTATIN) Apply 1 application topically 2 (two) times daily as needed for dry skin (skin irritation).   Yes [provider]  oxybutynin (DITROPAN-XL) 10 MG 24 hr tablet Take 1 tablet (10 mg total) by mouth daily. 02/18/15  Yes Biagio Borg, MD  pantoprazole (PROTONIX) 40 MG tablet TAKE 1 TABLET(40 MG) BY MOUTH TWICE DAILY 03/02/17  Yes Byrum, Rose Fillers, MD  predniSONE (DELTASONE) 10 MG tablet Take 2 tablets (20 mg total) by mouth daily with breakfast. 09/03/17  Yes Aline August, MD  ranitidine (ZANTAC) 300 MG tablet Take 1 tablet (300 mg total) by mouth 2 (two) times daily. 11/22/17  Yes  Debbe Odea, MD  sodium chloride (OCEAN) 0.65 % SOLN nasal spray Place 2 sprays into both nostrils 2 (two) times daily.   Yes [provider]  traMADol (ULTRAM) 50 MG tablet Take 50 mg by mouth every 12 (twelve) hours as needed for moderate pain.   Yes [provider]  TURMERIC PO Take 1 tablet by mouth daily.   Yes [provider]  bisacodyl (DULCOLAX) 10 MG suppository Place 1 suppository (10 mg total) rectally daily. Patient not taking: Reported on 12/14/2017 11/23/17   Debbe Odea, MD  ciprofloxacin (CIPRO) 500 MG tablet Take 1 tablet (500 mg total) by mouth 2 (two) times daily. Patient not taking: Reported on 12/06/2017 11/22/17   Debbe Odea, MD  gabapentin (NEURONTIN) 400 MG capsule Take 1 capsule (400 mg total) by mouth 3 (three) times daily. Patient not taking: Reported on 12/14/2017 11/22/17   Debbe Odea, MD  Insulin Syringe-Needle U-100 (INSULIN SYRINGE 1CC/31GX5/16") 31G X 5/16" 1 ML MISC USE TO INJECT INSULIN TWICE DAILY 05/11/17   Renato Shin, MD  Lidocaine-Menthol (LIMENCIN) 4-4 % [facility] Apply 1 patch topically See admin instructions. Apply 1 patch daily to the right shoulder/remove each evening    [provider]  mupirocin ointment (BACTROBAN) 2 % Place 1 application into the nose 2 (two) times daily. Patient not taking: Reported on 12/14/2017 11/22/17   Debbe Odea, MD  ONE [facility] LANCETS MISC Use to check sugars twice daily DX E11.22 08/23/17   Renato Shin, MD    Physical Exam: Vitals:   12/14/17 2330 12/14/17 2345 12/15/17 0000 12/15/17 0050  BP: (!) 99/57 (!) 104/58 109/63 (!) 136/93  Pulse: 86 82 84 82  Resp: (!) 22 18 20 16   Temp:    97.6 F (36.4 C)  TempSrc:      SpO2: 95% 94% 95% 100%  Weight:      Height:          Constitutional: Moderately built and nourished. Vitals:   12/14/17 2330 12/14/17 2345 12/15/17 0000 12/15/17 0050  BP: (!) 99/57 (!) 104/58 109/63 (!) 136/93  Pulse: 86 82 84 82  Resp: (!) 22 18 20  16  Temp:     97.6 F (36.4 C)  TempSrc:      SpO2: 95% 94% 95% 100%  Weight:      Height:       Eyes: Anicteric no pallor. ENMT: No discharge from the ears eyes nose or mouth. Neck: No mass felt.  No neck rigidity.  No JVD appreciated. Respiratory: No rhonchi or crepitations. Cardiovascular: S1-S2 heard no murmurs appreciated. Abdomen: Soft nontender bowel sounds present. Musculoskeletal: No edema.  No joint effusion.  Has difficulty moving right lower extremity due to pain. Skin: No rash. Neurologic: Alert awake oriented to time place and person.  Moves all extremity well as difficulty moving right lower extremity due to pain. Psychiatric: Appears normal per normal affect.   Labs on Admission: I have personally reviewed following labs and imaging studies  CBC: Recent Labs  Lab 12/08/17 1925 12/08/17 1940 12/14/17 1732  WBC 10.8*  --  9.4  NEUTROABS  --   --  7.5  HGB 11.1* 11.2* 11.3*  HCT 36.7 33.0* 38.3  MCV 94.1  --  95.5  PLT 251  --  353   Basic Metabolic Panel: Recent Labs  Lab 12/08/17 1925 12/08/17 1940 12/08/17 2046 12/14/17 1732  NA 137 136 140 141  K 5.5* 5.6* 4.3 4.7  CL 99* 101 100* 102  CO2 28  --  30 29  GLUCOSE 193* 194* 129* 179*  BUN 20 31* 21* 25*  CREATININE 1.18* 1.10* 1.17* 1.20*  CALCIUM 9.8  --  9.9 9.7   GFR: Estimated Creatinine Clearance: 63.6 mL/min (A) (by C-G formula based on SCr of 1.2 mg/dL (H)). Liver Function Tests: Recent Labs  Lab 12/14/17 1732  AST 17  ALT 30  ALKPHOS 72  BILITOT 0.6  PROT 7.6  ALBUMIN 3.2*   No results for input(s): LIPASE, AMYLASE in the last 168 hours. No results for input(s): AMMONIA in the last 168 hours. Coagulation Profile: No results for input(s): INR, PROTIME in the last 168 hours. Cardiac Enzymes: Recent Labs  Lab 12/14/17 1732  TROPONINI <0.03   BNP (last 3 results) No results for input(s): PROBNP in the last 8760 hours. HbA1C: No results for input(s): HGBA1C in the last 72  hours. CBG: Recent Labs  Lab 12/08/17 1838 12/08/17 1852 12/08/17 1858 12/08/17 1922 12/08/17 2203  GLUCAP 41* 46* 46* 165* 131*   Lipid Profile: No results for input(s): CHOL, HDL, LDLCALC, TRIG, CHOLHDL, LDLDIRECT in the last 72 hours. Thyroid Function Tests: No results for input(s): TSH, T4TOTAL, FREET4, T3FREE, THYROIDAB in the last 72 hours. Anemia Panel: No results for input(s): VITAMINB12, FOLATE, FERRITIN, TIBC, IRON, RETICCTPCT in the last 72 hours. Urine analysis:    Component Value Date/Time   COLORURINE YELLOW 12/14/2017 [place] 12/14/2017 1835   LABSPEC 1.017 12/14/2017 1835   PHURINE 5.0 12/14/2017 1835   GLUCOSEU 150 (A) 12/14/2017 1835   GLUCOSEU NEGATIVE 12/14/2011 1707   HGBUR NEGATIVE 12/14/2017 1835   BILIRUBINUR NEGATIVE 12/14/2017 1835   BILIRUBINUR n 11/16/2014 1515   KETONESUR NEGATIVE 12/14/2017 1835   PROTEINUR NEGATIVE 12/14/2017 1835   UROBILINOGEN 0.2 02/27/2015 2226   NITRITE NEGATIVE 12/14/2017 1835   LEUKOCYTESUR NEGATIVE 12/14/2017 1835   Sepsis Labs: @LABRCNTIP (procalcitonin:4,lacticidven:4) )No results found for this or any previous visit (from the past 240 hour(s)).   Radiological Exams on Admission: Mr Lumbar Spine Wo Contrast  Result Date: 12/14/2017 CLINICAL DATA:  Back pain with rapidly progressive neuro deficit. Increasing weakness. EXAM:  MRI LUMBAR SPINE WITHOUT CONTRAST TECHNIQUE: Multiplanar, multisequence MR imaging of the lumbar spine was performed. No intravenous contrast was administered. COMPARISON:  MRI lumbar spine 07/14/2017 FINDINGS: Segmentation:  Normal Alignment:  Normal Vertebrae:  Negative for fracture or mass Conus medullaris and cauda equina: Conus extends to the L2 level. Conus and cauda equina appear normal. Paraspinal and other soft tissues: Negative for paraspinous mass or soft tissue thickening. Disc levels: T12-L1: Mild disc and mild facet degeneration L1-2: Mild facet degeneration  bilaterally without stenosis L2-3: Diffuse disc bulging. Moderate facet hypertrophy. Moderate spinal stenosis. Mild progression of stenosis since the prior study. L3-4: Moderate to severe spinal stenosis with progression from the prior study. Severe facet degeneration. 3 x 6 mm synovial cyst associated with the ligamentum flavum on the right is new since the prior study and contributes to stenosis. Moderate subarticular stenosis bilaterally L4-5: Severe spinal stenosis similar to the prior study. Moderately large broad-based disc protrusion central and left-sided also unchanged from the prior study. Marked subarticular stenosis on the left unchanged. L5-S1: Central disc protrusion and mild facet degeneration. Mild subarticular stenosis bilaterally. IMPRESSION: Moderate spinal stenosis L2-3 progression since prior MRI earlier this year Moderate to severe spinal stenosis L3-4 with progression. Severe facet degeneration with new synovial cyst on the right Severe spinal stenosis L4-5 unchanged. Moderately large broad-based central and left-sided disc protrusion unchanged. Marked subarticular stenosis on the left unchanged Small central disc protrusion L5-S1 unchanged. Electronically Signed   By: Franchot Gallo M.D.   On: 12/14/2017 21:08   Dg Chest Portable 1 View  Result Date: 12/14/2017 CLINICAL DATA:  Increasing chronic weakness resulting in multiple falls. EXAM: PORTABLE CHEST 1 VIEW COMPARISON:  11/17/2017. FINDINGS: The cardiac silhouette remains mildly enlarged. Clear lungs with normal vascularity. Mildly tortuous and calcified thoracic aorta. Cervical spine fixation hardware. Lower thoracic spine degenerative changes. IMPRESSION: No acute abnormality. Electronically Signed   By: Claudie Revering M.D.   On: 12/14/2017 17:32    EKG: Independently reviewed.  Normal sinus rhythm.  Assessment/Plan Principal Problem:   Weakness generalized Active Problems:   Morbid obesity ([place])   Essential hypertension    Sleep apnea   Low back pain   Diabetes mellitus type 2 in obese (HCC)   Weakness    1. Generalized weakness -patient was mildly hypotensive on admission.  Will keep patient on gentle hydration hold ARB and get physical therapy consult.  Check orthostatics in a.m.  Patient maintained rehab. 2. Low back pain with right lower extremity weakness with progression of lumbar stenosis -ER physician discussed with Dr. Annette Stable on-call neurosurgeon who advised follow-up as outpatient.  Presently on pain relief medications.  May need rehab. 3. Hypertension we will hold ARB but keep patient on metoprolol due to low normal blood pressure at admission.  Check orthostatics in a.m. 4. Diabetes mellitus type 2 on NPH insulin 120 units in the morning.  Follow CBGs closely. 5. Sleep apnea on CPAP. 6. Atypical chest pain which happens only on movement.  Will cycle cardiac markers and check d-dimer. 7. History of gout on allopurinol. 8. Chronic kidney disease stage II appears to be at baseline. 9. Normocytic normochromic anemia appears to be chronic follow CBC.   DVT prophylaxis: Lovenox. Code Status: Full code. Family Communication: Discussed with patient. Disposition Plan: Home. Consults called: Physical therapy. Admission status: Observation.   Rise Patience MD [facility] Pager [phone].  If 7PM-7AM, please contact night-coverage [email or web address] Password [facility]  12/15/2017, 2:59 AM

## 2017-12-15 NOTE — Progress Notes (Signed)
[facility] PROGRESS NOTE  Christy May IOX:[id number] DOB: Oct 10, 1950 DOA: 12/14/2017  PCP: Lucianne Lei, MD  Brief History/Interval Summary: 67 year old female with a past medical history of diabetes mellitus type 2, essential hypertension, anemia, gout, on chronic steroids for some kind of a syndrome, was hospitalized 3 weeks ago for complicated UTI.  She presented with increased weakness in her legs and low back pain.  Apparently she has had multiple falls.  She has chronic chest pain which is thought to be musculoskeletal.  MRI of the lumbar spine shows stenosis.  Patient was hospitalized for further management.  Reason for Visit: Generalized weakness.  Lumbar stenosis  Consultants: Phone discussion with neurosurgery  Procedures: None  Antibiotics: None  Subjective/Interval History: Continues to have some back pain.  Continues to feel weak.  Has not really ambulated any so far.  ROS: Denies any nausea vomiting.  Chest pain is chronic.  Denies any shortness of breath.  Objective:  Vital Signs  Vitals:   12/15/17 0330 12/15/17 0500 12/15/17 0522 12/15/17 0819  BP: (!) 146/59  131/60 130/65  Pulse: 92 91 85 64  Resp:  16 16   Temp:   98.2 F (36.8 C)   TempSrc:      SpO2:  98% 98%   Weight:      Height:        Intake/Output Summary (Last 24 hours) at 12/15/2017 1233 Last data filed at 12/15/2017 1223 Gross per 24 hour  Intake 120 ml  Output 1950 ml  Net -1830 ml   Filed Weights   12/14/17 1534  Weight: (!) 142.4 kg (314 lb)    General appearance: alert, cooperative, appears stated age and no distress Head: Normocephalic, without obvious abnormality, atraumatic Resp: clear to auscultation bilaterally Cardio: regular rate and rhythm, S1, S2 normal, no murmur, click, rub or gallop GI: soft, non-tender; bowel sounds normal; no masses,  no organomegaly Extremities: extremities normal, atraumatic, no cyanosis or edema Neurologic: Cranial nerves II  through XII intact.  Motor strength equal bilateral upper extremities.  Strength in the right leg appears to be less compared to the left.  She is able to lift her right leg but not as much as with the left.  Lab Results:  Data Reviewed: I have personally reviewed following labs and imaging studies  CBC: Recent Labs  Lab 12/08/17 1925 12/08/17 1940 12/14/17 1732 12/15/17 0605  WBC 10.8*  --  9.4 9.7  NEUTROABS  --   --  7.5  --   HGB 11.1* 11.2* 11.3* 10.1*  HCT 36.7 33.0* 38.3 33.2*  MCV 94.1  --  95.5 93.8  PLT 251  --  239 268    Basic Metabolic Panel: Recent Labs  Lab 12/08/17 1925 12/08/17 1940 12/08/17 2046 12/14/17 1732 12/15/17 0605  NA 137 136 140 141 141  K 5.5* 5.6* 4.3 4.7 3.9  CL 99* 101 100* 102 102  CO2 28  --  30 29 28   GLUCOSE 193* 194* 129* 179* 197*  BUN 20 31* 21* 25* 20  CREATININE 1.18* 1.10* 1.17* 1.20* 1.08*  CALCIUM 9.8  --  9.9 9.7 9.1    GFR: Estimated Creatinine Clearance: 70.7 mL/min (A) (by C-G formula based on SCr of 1.08 mg/dL (H)).  Liver Function Tests: Recent Labs  Lab 12/14/17 1732  AST 17  ALT 30  ALKPHOS 72  BILITOT 0.6  PROT 7.6  ALBUMIN 3.2*    Cardiac Enzymes: Recent Labs  Lab 12/14/17 1732 12/15/17  7425  TROPONINI <0.03 <0.03     CBG: Recent Labs  Lab 12/08/17 1858 12/08/17 1922 12/08/17 2203 12/15/17 0745 12/15/17 1140  GLUCAP 46* 165* 131* 134* 226*      Recent Results (from the past 240 hour(s))  MRSA PCR Screening     Status: Abnormal   Collection Time: 12/15/17 12:57 AM  Result Value Ref Range Status   MRSA by PCR POSITIVE (A) NEGATIVE Final    Comment:        The GeneXpert MRSA Assay (FDA approved for NASAL specimens only), is one component of a comprehensive MRSA colonization surveillance program. It is not intended to diagnose MRSA infection nor to guide or monitor treatment for MRSA infections. RESULT CALLED TO, READ BACK BY AND VERIFIED WITH: Q LOFTON RN 12/15/17 0211  JDW Performed at [facility], [address] [place], [place] [address]       Radiology Studies: Dg Knee 1-2 Views Right  Result Date: 12/15/2017 CLINICAL DATA:  Knee pain following fall, initial encounter EXAM: RIGHT KNEE - 1-2 VIEW COMPARISON:  12/08/2017 FINDINGS: Right knee replacement is noted. No acute fracture or dislocation is noted. No loosening is identified. No joint effusion is seen. IMPRESSION: Postoperative change without acute abnormality. Electronically Signed   By: Inez Catalina M.D.   On: 12/15/2017 07:25   Mr Lumbar Spine Wo Contrast  Result Date: 12/14/2017 CLINICAL DATA:  Back pain with rapidly progressive neuro deficit. Increasing weakness. EXAM: MRI LUMBAR SPINE WITHOUT CONTRAST TECHNIQUE: Multiplanar, multisequence MR imaging of the lumbar spine was performed. No intravenous contrast was administered. COMPARISON:  MRI lumbar spine 07/14/2017 FINDINGS: Segmentation:  Normal Alignment:  Normal Vertebrae:  Negative for fracture or mass Conus medullaris and cauda equina: Conus extends to the L2 level. Conus and cauda equina appear normal. Paraspinal and other soft tissues: Negative for paraspinous mass or soft tissue thickening. Disc levels: T12-L1: Mild disc and mild facet degeneration L1-2: Mild facet degeneration bilaterally without stenosis L2-3: Diffuse disc bulging. Moderate facet hypertrophy. Moderate spinal stenosis. Mild progression of stenosis since the prior study. L3-4: Moderate to severe spinal stenosis with progression from the prior study. Severe facet degeneration. 3 x 6 mm synovial cyst associated with the ligamentum flavum on the right is new since the prior study and contributes to stenosis. Moderate subarticular stenosis bilaterally L4-5: Severe spinal stenosis similar to the prior study. Moderately large broad-based disc protrusion central and left-sided also unchanged from the prior study. Marked subarticular stenosis on the left unchanged. L5-S1: Central  disc protrusion and mild facet degeneration. Mild subarticular stenosis bilaterally. IMPRESSION: Moderate spinal stenosis L2-3 progression since prior MRI earlier this year Moderate to severe spinal stenosis L3-4 with progression. Severe facet degeneration with new synovial cyst on the right Severe spinal stenosis L4-5 unchanged. Moderately large broad-based central and left-sided disc protrusion unchanged. Marked subarticular stenosis on the left unchanged Small central disc protrusion L5-S1 unchanged. Electronically Signed   By: Franchot Gallo M.D.   On: 12/14/2017 21:08   Dg Pelvis Portable  Result Date: 12/15/2017 CLINICAL DATA:  Pelvic pain following fall, initial encounter EXAM: PORTABLE PELVIS 1-2 VIEWS COMPARISON:  11/17/2017 FINDINGS: Pelvic ring is intact. Degenerative changes of lumbar spine and hip joints are noted. No acute fracture or dislocation is noted. No soft tissue changes are seen. IMPRESSION: Degenerative change without acute abnormality. Electronically Signed   By: Inez Catalina M.D.   On: 12/15/2017 07:23   Dg Chest Portable 1 View  Result Date: 12/14/2017  CLINICAL DATA:  Increasing chronic weakness resulting in multiple falls. EXAM: PORTABLE CHEST 1 VIEW COMPARISON:  11/17/2017. FINDINGS: The cardiac silhouette remains mildly enlarged. Clear lungs with normal vascularity. Mildly tortuous and calcified thoracic aorta. Cervical spine fixation hardware. Lower thoracic spine degenerative changes. IMPRESSION: No acute abnormality. Electronically Signed   By: Claudie Revering M.D.   On: 12/14/2017 17:32     Medications:  Scheduled: . allopurinol  300 mg Oral Daily  . aspirin EC  81 mg Oral QHS  . Chlorhexidine Gluconate Cloth  6 each Topical Q0600  . cholecalciferol  2,000 Units Oral Daily  . cycloSPORINE  1 drop Both Eyes BID  . enoxaparin (LOVENOX) injection  70 mg Subcutaneous Q24H  . famotidine  20 mg Oral BID  . fluticasone  1 spray Each Nare Daily  . folic acid  1 mg Oral  Daily  . gabapentin  800 mg Oral TID  . insulin aspart  0-9 Units Subcutaneous TID WC  . insulin NPH Human  120 Units Subcutaneous BH-[id number]  . lidocaine  1 patch Transdermal Daily  . loratadine  10 mg Oral Daily  . metoprolol tartrate  75 mg Oral BID  . montelukast  10 mg Oral QHS  . multivitamin with minerals  1 tablet Oral Daily  . mupirocin ointment  1 application Nasal BID  . oxybutynin  10 mg Oral Daily  . pantoprazole  40 mg Oral Daily  . predniSONE  20 mg Oral Q breakfast   Continuous: . sodium chloride 100 mL/hr at 12/15/17 1119   VOH:[id number] **OR** acetaminophen, albuterol, cyclobenzaprine, hydrALAZINE, HYDROcodone-acetaminophen, ondansetron **OR** ondansetron (ZOFRAN) IV, traMADol  Assessment/Plan:    Generalized weakness and deconditioning Patient was recently hospitalized for UTI.  She is also noted to have lumbar stenosis on MRI.  Was thought to be dehydrated at admission and was given IV fluids.  PT and OT eval is pending.  Lumbar stenosis Patient with low back pain and some weakness in the right leg.  MRI shows evidence for lumbar stenosis at multiple levels with disc protrusion.  Some of these findings are stable and some have progressed compared to previous MRI.  This was discussed with neurosurgery.  Recommended physical therapy and outpatient follow-up.  Previously seen by Dr. Arnoldo Morale who operated on her cervical spine in 2006.  Essential hypertension Patient was noted to have low blood pressure on admission.  Now improved.  Holding ARB.  Continue metoprolol.  Monitor closely.  Diabetes mellitus type 2 She is on NPH insulin at home.  This has been continued.  Monitor CBGs.  Atypical chest pain Patient with long-standing history of same.  She states that she has musculoskeletal chest pain.  Denies any worsening recently.  No shortness of breath.  She is noted to have elevated d-dimer.  However since her symptoms of chronic we will not pursue this further.   Troponins are normal.  Do not show any ischemic changes.  History of gout Continue allopurinol  Chronic kidney disease stage II Appears to be at baseline.  Monitor urine output.  Normocytic anemia Chronic.  Hemoglobin at baseline.  Obstructive sleep apnea Continue CPAP.  DVT Prophylaxis: Lovenox    Code Status: Full code Family Communication: Discussed with the patient Disposition Plan: Await PT and OT evaluation.    LOS: 0 days   [facility] Pager [phone] 12/15/2017, 12:33 PM  If 7PM-7AM, please contact night-coverage at [email or web address], password [facility]

## 2017-12-15 NOTE — Progress Notes (Signed)
Patient arrived to until via stretcher. Awake alert and oriented x4.  Call bell and personal items with reach of patient.Bed alarm activated. MD paged for orders. Informed patient to call for help before getting out of bed. Patient verbalizes understanding.

## 2017-12-15 NOTE — Progress Notes (Addendum)
Received report from  ED . Bed ready and available for patient

## 2017-12-16 DIAGNOSIS — M48061 Spinal stenosis, lumbar region without neurogenic claudication: Secondary | ICD-10-CM | POA: Diagnosis not present

## 2017-12-16 DIAGNOSIS — M48062 Spinal stenosis, lumbar region with neurogenic claudication: Secondary | ICD-10-CM | POA: Diagnosis not present

## 2017-12-16 LAB — BASIC METABOLIC PANEL
Anion gap: 7 (ref 5–15)
BUN: 18 mg/dL (ref 8–23)
CHLORIDE: 105 mmol/L (ref 98–111)
CO2: 30 mmol/L (ref 22–32)
CREATININE: 1.05 mg/dL — AB (ref 0.44–1.00)
Calcium: 8.9 mg/dL (ref 8.9–10.3)
GFR calc Af Amer: >60 mL/min (ref >60)
GFR, EST NON AFRICAN AMERICAN: 53 mL/min — AB (ref >60)
GLUCOSE: 209 mg/dL — AB (ref 70–99)
Potassium: 4.2 mmol/L (ref 3.5–5.1)
SODIUM: 142 mmol/L (ref 135–145)

## 2017-12-16 LAB — CBC
HCT: 33.2 % — ABNORMAL LOW (ref 36.0–46.0)
Hemoglobin: 9.8 g/dL — ABNORMAL LOW (ref 12.0–15.0)
MCH: 28 pg (ref 26.0–34.0)
MCHC: 29.5 g/dL — AB (ref 30.0–36.0)
MCV: 94.9 fL (ref 78.0–100.0)
PLATELETS: 212 10*3/uL (ref 150–400)
RBC: 3.5 MIL/uL — ABNORMAL LOW (ref 3.87–5.11)
RDW: 15.8 % — ABNORMAL HIGH (ref 11.5–15.5)
WBC: 9.3 10*3/uL (ref 4.0–10.5)

## 2017-12-16 LAB — GLUCOSE, CAPILLARY
GLUCOSE-CAPILLARY: 159 mg/dL — AB (ref 70–99)
Glucose-Capillary: 219 mg/dL — ABNORMAL HIGH (ref 70–99)
Glucose-Capillary: 237 mg/dL — ABNORMAL HIGH (ref 70–99)
Glucose-Capillary: 238 mg/dL — ABNORMAL HIGH (ref 70–99)

## 2017-12-16 MED ORDER — FLUTICASONE PROPIONATE 50 MCG/ACT NA SUSP
2.0000 | Freq: Every day | NASAL | Status: DC
  Administered 2017-12-16 – 2017-12-17 (×2): 2 via NASAL
  Filled 2017-12-16: qty 16

## 2017-12-16 MED ORDER — SALINE SPRAY 0.65 % NA SOLN
1.0000 | NASAL | Status: DC | PRN
  Administered 2017-12-16: 1 via NASAL
  Filled 2017-12-16: qty 44

## 2017-12-16 NOTE — Progress Notes (Signed)
Physical Therapy Treatment Patient Details Name: Christy May MRN: [id number] DOB: 03-03-1951 Today's Date: 12/16/2017    History of Present Illness Pt is a 67 y.o. female presenting with increased LE weakness and low back pain. PMHx: DM2, Essential HTN, Anemia, Gout, and recent hospitalization for complicated UTI.    PT Comments    Pt continues to demonstrate LE weakness and difficulty in bed mobility & transfers, requiring min-to-mod assist. She tolerated supine exercises without increased pain, but had poor tolerance to sidelying-sit and sit-supine transfers, experiencing increased pain. Her current presentation still indicates that she requires short-term placement in a SNF to improve mobility. Plan to continue transfer training and progress to gait as able.  Follow Up Recommendations  SNF     Equipment Recommendations  None recommended by PT    Recommendations for Other Services       Precautions / Restrictions Precautions Precautions: Fall Restrictions Weight Bearing Restrictions: No    Mobility  Bed Mobility Overal bed mobility: Needs Assistance Bed Mobility: Rolling;Sidelying to Sit Rolling: Min assist Sidelying to sit: Mod assist   Sit to supine: Mod assist   General bed mobility comments: Assist for LEs and HHA for trunk elevation to sitting. Pt able to scoot hips out to EOB. HOB flat with use of bed rails. Increased time and effort throughout  Transfers Overall transfer level: Needs assistance   Transfers: Sit to/from Stand Sit to Stand: Min assist;+2 physical assistance;From elevated surface         General transfer comment: Mod assist +2 to boost up from EOB high enough to close [place] panels, multiple cues to extend hips; min guard to stand within [employer]  Ambulation/Gait                 Stairs             Wheelchair Mobility    Modified Rankin (Stroke Patients Only)       Balance Overall balance assessment: Needs  assistance Sitting-balance support: Feet supported Sitting balance-Leahy Scale: Poor     Standing balance support: Bilateral upper extremity supported Standing balance-Leahy Scale: Poor                              Cognition Arousal/Alertness: Awake/alert Behavior During Therapy: WFL for tasks assessed/performed Overall Cognitive Status: Within Functional Limits for tasks assessed                                        Exercises General Exercises - Lower Extremity Ankle Circles/Pumps: AROM;Both;20 reps;Supine Quad Sets: AROM;Both;Supine;10 reps Short Arc Quad: AROM;AAROM;Both;10 reps;Supine(AAROM on R, AROM on L) Heel Slides: AROM;Both;10 reps;Supine Straight Leg Raises: AROM;AAROM;Both;10 reps;Supine(assist to keep R knee extended) Other Exercises Other Exercises: Sit-stand in [place] x10    General Comments        Pertinent Vitals/Pain Pain Assessment: 0-10 Pain Score: 7  Pain Location: R shoulder and leg, low back Pain Descriptors / Indicators: Aching;Discomfort Pain Intervention(s): Monitored during session;Limited activity within patient's tolerance;Repositioned    Home Living                      Prior Function            PT Goals (current goals can now be found in the care plan section) Acute Rehab PT Goals Patient Stated Goal: be  able to walk again PT Goal Formulation: With patient Potential to Achieve Goals: Good Progress towards PT goals: Progressing toward goals    Frequency    Min 2X/week      PT Plan      Co-evaluation     PT goals addressed during session: Mobility/safety with mobility        AM-PAC PT "6 Clicks" Daily Activity  Outcome Measure  Difficulty turning over in bed (including adjusting bedclothes, sheets and blankets)?: Unable Difficulty moving from lying on back to sitting on the side of the bed? : Unable Difficulty sitting down on and standing up from a chair with arms (e.g.,  wheelchair, bedside commode, etc,.)?: Unable Help needed moving to and from a bed to chair (including a wheelchair)?: A Lot Help needed walking in hospital room?: A Lot Help needed climbing 3-5 steps with a railing? : Total 6 Click Score: 8    End of Session   Activity Tolerance: Patient tolerated treatment well Patient left: in bed;with call bell/phone within reach;with bed alarm set   PT Visit Diagnosis: Difficulty in walking, not elsewhere classified (R26.2);Muscle weakness (generalized) (M62.81)     Time: 0165-5374 PT Time Calculation (min) (ACUTE ONLY): 44 min  Charges:  $Therapeutic Exercise: 8-22 mins $Therapeutic Activity: 23-37 mins                    G Codes:       Scarlett Presto, SPTA   Dailan Pfalzgraf Sheila Oats 12/16/2017, 6:21 PM

## 2017-12-16 NOTE — Discharge Summary (Signed)
[facility]  Physician Discharge Summary   Patient ID: Christy May MRN: [id number] DOB/AGE: 1951/03/29 68 y.o.  Admit date: 12/14/2017 Discharge date: 12/16/2017  PCP: Lucianne Lei, MD  DISCHARGE DIAGNOSES:  Lumbar spinal stenosis  RECOMMENDATIONS FOR OUTPATIENT FOLLOW UP: 1. Patient needs to follow-up with neurosurgery as outpatient.  She has seen Dr. Arnoldo Morale previously.   DISCHARGE CONDITION: fair  Diet recommendation: As before  Filed Weights   12/14/17 1534  Weight: (!) 142.4 kg (314 lb)    INITIAL HISTORY: 67 year old female with a past medical history of diabetes mellitus type 2, essential hypertension, anemia, gout, on chronic steroids for some kind of a syndrome, was hospitalized 3 weeks ago for complicated UTI.  She presented with increased weakness in her legs and low back pain.  Apparently she has had multiple falls.  She has chronic chest pain which is thought to be musculoskeletal.  MRI of the lumbar spine shows stenosis.  Patient was hospitalized for further management.   Consultations:  Phone discussion with neurosurgery  Procedures:  None   HOSPITAL COURSE:   Generalized weakness and deconditioning Patient was recently hospitalized for UTI.  She is also noted to have lumbar stenosis on MRI.  Was thought to be dehydrated at admission and was given IV fluids.    Seen by physical and occupational therapy.  Skilled nursing facility was recommended.  However due to patient's insurance this will require patient to give up her social security checks.  She is not willing to do so.  She elects to go home.  Maximize home health.  Social worker also arranging resources through [facility]  Lumbar stenosis Patient with low back pain and some weakness in the right leg.  MRI shows evidence for lumbar stenosis at multiple levels with disc protrusion.  Some of these findings are stable and some have progressed compared to previous MRI.  This was discussed with  neurosurgery who recommended physical therapy and outpatient follow-up.  Previously seen by Dr. Arnoldo Morale who operated on her cervical spine in 2006.  She has been told to follow-up with her own.  Essential hypertension Patient was noted to have low blood pressure on admission.  Now improved.    Continue home medications.  Diabetes mellitus type 2 She is on NPH insulin at home.    Atypical chest pain Patient with long-standing history of same.  She states that she has musculoskeletal chest pain.  Denies any worsening recently.  No shortness of breath.  She is noted to have elevated d-dimer.  However since her symptoms of chronic we will not pursue this further.  Troponins are normal.  Do not show any ischemic changes.  Elevated d-dimer most likely due to other reasons.  History of gout Continue allopurinol  Chronic kidney disease stage II Appears to be at baseline.   Normocytic anemia Chronic.  Hemoglobin at baseline.  Obstructive sleep apnea Continue CPAP.  Overall stable.  Does not need hospitalization for any further work-up.  Will need to see neurosurgery as an outpatient.  Okay for discharge today.    PERTINENT LABS:  The results of significant diagnostics from this hospitalization (including imaging, microbiology, ancillary and laboratory) are listed below for reference.    Microbiology: Recent Results (from the past 240 hour(s))  MRSA PCR Screening     Status: Abnormal   Collection Time: 12/15/17 12:57 AM  Result Value Ref Range Status   MRSA by PCR POSITIVE (A) NEGATIVE Final    Comment:  The GeneXpert MRSA Assay (FDA approved for NASAL specimens only), is one component of a comprehensive MRSA colonization surveillance program. It is not intended to diagnose MRSA infection nor to guide or monitor treatment for MRSA infections. RESULT CALLED TO, READ BACK BY AND VERIFIED WITH: Q LOFTON RN 12/15/17 0211 JDW Performed at [facility], [address] [place], [place] [address]      Labs: Basic Metabolic Panel: Recent Labs  Lab 12/14/17 1732 12/15/17 0605 12/16/17 0352  NA 141 141 142  K 4.7 3.9 4.2  CL 102 102 105  CO2 29 28 30   GLUCOSE 179* 197* 209*  BUN 25* 20 18  CREATININE 1.20* 1.08* 1.05*  CALCIUM 9.7 9.1 8.9   Liver Function Tests: Recent Labs  Lab 12/14/17 1732  AST 17  ALT 30  ALKPHOS 72  BILITOT 0.6  PROT 7.6  ALBUMIN 3.2*   CBC: Recent Labs  Lab 12/14/17 1732 12/15/17 0605 12/16/17 0352  WBC 9.4 9.7 9.3  NEUTROABS 7.5  --   --   HGB 11.3* 10.1* 9.8*  HCT 38.3 33.2* 33.2*  MCV 95.5 93.8 94.9  PLT 239 207 212   Cardiac Enzymes: Recent Labs  Lab 12/14/17 1732 12/15/17 0605 12/15/17 1125  TROPONINI <0.03 <0.03 <0.03    CBG: Recent Labs  Lab 12/15/17 1140 12/15/17 1635 12/15/17 2048 12/16/17 0758 12/16/17 1217  GLUCAP 226* 205* 138* 159* 219*     IMAGING STUDIES Dg Knee 1-2 Views Right  Result Date: 12/15/2017 CLINICAL DATA:  Knee pain following fall, initial encounter EXAM: RIGHT KNEE - 1-2 VIEW COMPARISON:  12/08/2017 FINDINGS: Right knee replacement is noted. No acute fracture or dislocation is noted. No loosening is identified. No joint effusion is seen. IMPRESSION: Postoperative change without acute abnormality. Electronically Signed   By: Inez Catalina M.D.   On: 12/15/2017 07:25    Mr Lumbar Spine Wo Contrast  Result Date: 12/14/2017 CLINICAL DATA:  Back pain with rapidly progressive neuro deficit. Increasing weakness. EXAM: MRI LUMBAR SPINE WITHOUT CONTRAST TECHNIQUE: Multiplanar, multisequence MR imaging of the lumbar spine was performed. No intravenous contrast was administered. COMPARISON:  MRI lumbar spine 07/14/2017 FINDINGS: Segmentation:  Normal Alignment:  Normal Vertebrae:  Negative for fracture or mass Conus medullaris and cauda equina: Conus extends to the L2 level. Conus and cauda equina appear normal. Paraspinal and other soft tissues: Negative for paraspinous  mass or soft tissue thickening. Disc levels: T12-L1: Mild disc and mild facet degeneration L1-2: Mild facet degeneration bilaterally without stenosis L2-3: Diffuse disc bulging. Moderate facet hypertrophy. Moderate spinal stenosis. Mild progression of stenosis since the prior study. L3-4: Moderate to severe spinal stenosis with progression from the prior study. Severe facet degeneration. 3 x 6 mm synovial cyst associated with the ligamentum flavum on the right is new since the prior study and contributes to stenosis. Moderate subarticular stenosis bilaterally L4-5: Severe spinal stenosis similar to the prior study. Moderately large broad-based disc protrusion central and left-sided also unchanged from the prior study. Marked subarticular stenosis on the left unchanged. L5-S1: Central disc protrusion and mild facet degeneration. Mild subarticular stenosis bilaterally. IMPRESSION: Moderate spinal stenosis L2-3 progression since prior MRI earlier this year Moderate to severe spinal stenosis L3-4 with progression. Severe facet degeneration with new synovial cyst on the right Severe spinal stenosis L4-5 unchanged. Moderately large broad-based central and left-sided disc protrusion unchanged. Marked subarticular stenosis on the left unchanged Small central disc protrusion L5-S1 unchanged. Electronically Signed   By: Juanda Crumble  Carlis Abbott M.D.   On: 12/14/2017 21:08    Dg Pelvis Portable  Result Date: 12/15/2017 CLINICAL DATA:  Pelvic pain following fall, initial encounter EXAM: PORTABLE PELVIS 1-2 VIEWS COMPARISON:  11/17/2017 FINDINGS: Pelvic ring is intact. Degenerative changes of lumbar spine and hip joints are noted. No acute fracture or dislocation is noted. No soft tissue changes are seen. IMPRESSION: Degenerative change without acute abnormality. Electronically Signed   By: Inez Catalina M.D.   On: 12/15/2017 07:23   Dg Chest Portable 1 View  Result Date: 12/14/2017 CLINICAL DATA:  Increasing chronic weakness  resulting in multiple falls. EXAM: PORTABLE CHEST 1 VIEW COMPARISON:  11/17/2017. FINDINGS: The cardiac silhouette remains mildly enlarged. Clear lungs with normal vascularity. Mildly tortuous and calcified thoracic aorta. Cervical spine fixation hardware. Lower thoracic spine degenerative changes. IMPRESSION: No acute abnormality. Electronically Signed   By: Claudie Revering M.D.   On: 12/14/2017 17:32     DISCHARGE EXAMINATION: Vitals:   12/15/17 2203 12/16/17 0617 12/16/17 0855 12/16/17 1313  BP: 111/81 (!) 151/58 129/72 (!) 113/52  Pulse: 81 78 86 88  Resp:  12  14  Temp:  97.7 F (36.5 C)  98.1 F (36.7 C)  TempSrc:    Oral  SpO2:  100%  96%  Weight:      Height:       General appearance: alert, cooperative, appears stated age and no distress Resp: clear to auscultation bilaterally Cardio: regular rate and rhythm, S1, S2 normal, no murmur, click, rub or gallop GI: soft, non-tender; bowel sounds normal; no masses,  no organomegaly  DISPOSITION: Home with home health  Discharge Instructions    Call MD for:  difficulty breathing, headache or visual disturbances   Complete by:  As directed    Call MD for:  extreme fatigue   Complete by:  As directed    Call MD for:  persistant dizziness or light-headedness   Complete by:  As directed    Call MD for:  persistant nausea and vomiting   Complete by:  As directed    Call MD for:  severe uncontrolled pain   Complete by:  As directed    Call MD for:  temperature >100.4   Complete by:  As directed    Discharge instructions   Complete by:  As directed    Please be sure to follow-up with Dr. Arnoldo Morale for your back issues.  You were cared for by a hospitalist during your hospital stay. If you have any questions about your discharge medications or the care you received while you were in the hospital after you are discharged, you can call the unit and asked to speak with the hospitalist on call if the hospitalist that took care of you is not  available. Once you are discharged, your primary care physician will handle any further medical issues. Please note that NO REFILLS for any discharge medications will be authorized once you are discharged, as it is imperative that you return to your primary care physician (or establish a relationship with a primary care physician if you do not have one) for your aftercare needs so that they can reassess your need for medications and monitor your lab values. If you do not have a primary care physician, you can call [phone] for a physician referral.   Increase activity slowly   Complete by:  As directed         Allergies as of 12/16/2017      Reactions   Ace  Inhibitors Anaphylaxis, Cough   Penicillins Hives, Other (See Comments)   Tolerated ceftriaxone in 2019 Has patient had a PCN reaction causing immediate rash, facial/tongue/throat swelling, SOB or lightheadedness with hypotension: Yes Has patient had a PCN reaction causing severe rash involving mucus membranes or skin necrosis:  No Has patient had a PCN reaction that required hospitalization: No Has patient had a PCN reaction occurring within the last 10 years: No If all of the above answers are "NO", then may proceed with Cephalosporin use.   Adhesive [tape] Hives, Rash      Medication List    STOP taking these medications   bisacodyl 10 MG suppository Commonly known as:  DULCOLAX   ciprofloxacin 500 MG tablet Commonly known as:  CIPRO   mupirocin ointment 2 % Commonly known as:  BACTROBAN     TAKE these medications   albuterol (2.5 MG/3ML) 0.083% nebulizer solution Commonly known as:  PROVENTIL Take 2.5 mg by nebulization 4 (four) times daily as needed for wheezing or shortness of breath.   allopurinol 300 MG tablet Commonly known as:  ZYLOPRIM Take 300 mg by mouth daily.   aspirin EC 81 MG tablet Take 81 mg by mouth at bedtime.   cyclobenzaprine 10 MG tablet Commonly known as:  FLEXERIL Take 10 mg by mouth  daily.   cycloSPORINE 0.05 % ophthalmic emulsion Commonly known as:  RESTASIS Place 1 drop into both eyes 2 (two) times daily.   fluticasone 50 MCG/ACT nasal spray Commonly known as:  FLONASE SHAKE LIQUID AND USE 2 SPRAYS IN EACH NOSTRIL DAILY   folic acid 1 MG tablet Commonly known as:  FOLVITE Take 1 mg by mouth once a day   gabapentin 800 MG tablet Commonly known as:  NEURONTIN Take 800 mg by mouth 3 (three) times daily. What changed:  Another medication with the same name was removed. Continue taking this medication, and follow the directions you see here.   GLUCOSAMINE CHONDR COMPLEX PO Take 1 tablet by mouth daily.   HYDROcodone-acetaminophen 5-325 MG tablet Commonly known as:  NORCO/VICODIN Take 1 tablet by mouth every 6 (six) hours as needed.   insulin NPH Human 100 UNIT/ML injection Commonly known as:  NOVOLIN N RELION Inject 1.2 mLs (120 Units total) into the skin every morning.   INSULIN SYRINGE 1CC/31GX5/16" 31G X 5/16" 1 ML Misc USE TO INJECT INSULIN TWICE DAILY   ipratropium 0.03 % nasal spray Commonly known as:  ATROVENT USE 2 SPRAYS IN EACH NOSTRIL THREE TIMES DAILY AS NEEDED FOR RHINITIS   irbesartan 300 MG tablet Commonly known as:  AVAPRO Take 300 mg by mouth daily.   LIMENCIN 4-4 % Ptch Generic drug:  Lidocaine-Menthol Apply 1 patch topically See admin instructions. Apply 1 patch daily to the right shoulder/remove each evening   loratadine 10 MG tablet Commonly known as:  CLARITIN Take 10 mg by mouth daily.   metFORMIN 500 MG tablet Commonly known as:  GLUCOPHAGE Take 1 tablet (500 mg total) by mouth 2 (two) times daily with a meal. What changed:  when to take this   metoprolol tartrate 50 MG tablet Commonly known as:  LOPRESSOR Take 75 mg by mouth 2 (two) times daily.   montelukast 10 MG tablet Commonly known as:  SINGULAIR TAKE 1 TABLET(10 MG) BY MOUTH DAILY   multivitamin with minerals tablet Take 1 tablet by mouth daily.    nystatin cream Commonly known as:  MYCOSTATIN Apply 1 application topically 2 (two) times daily as needed for  dry skin (skin irritation).   ONE TOUCH LANCETS Misc Use to check sugars twice daily DX E11.22   oxybutynin 10 MG 24 hr tablet Commonly known as:  DITROPAN-XL Take 1 tablet (10 mg total) by mouth daily.   pantoprazole 40 MG tablet Commonly known as:  PROTONIX TAKE 1 TABLET(40 MG) BY MOUTH TWICE DAILY   predniSONE 10 MG tablet Commonly known as:  DELTASONE Take 2 tablets (20 mg total) by mouth daily with breakfast.   ranitidine 300 MG tablet Commonly known as:  ZANTAC Take 1 tablet (300 mg total) by mouth 2 (two) times daily.   sodium chloride 0.65 % Soln nasal spray Commonly known as:  OCEAN Place 2 sprays into both nostrils 2 (two) times daily.   traMADol 50 MG tablet Commonly known as:  ULTRAM Take 50 mg by mouth every 12 (twelve) hours as needed for moderate pain.   TURMERIC PO Take 1 tablet by mouth daily.   Vitamin D-3 1000 units Caps Take 2,000 Units by mouth daily.        Follow-up Information    Newman Pies, MD. Schedule an appointment as soon as possible for a visit in 1 week(s).   Specialty:  Neurosurgery Contact information: [address] [place] [address] [phone]        Care, [facility] Follow up.   Specialty:  Home Health Services Why:  home health services arranged Contact information: [address] [place] [address] [phone]           TOTAL DISCHARGE TIME: 35 mins  [facility] Pager [phone]  12/16/2017, 2:26 PM

## 2017-12-16 NOTE — Consult Note (Signed)
   [facility] CM Inpatient Consult   12/16/2017  Christy May 04/07/1951 [id number]  Patient is currently active with [facility] for chronic disease management services.  Patient assessed for Patient has been engaged by a [employer] and CSW.  Our community based plan of care has focused on disease management and community resource support.   Met with patient and spoke with her at length regarding her health journey with falls. Patient has been recommended for [place] program per inpatient RNCM. Patient states she has fallen over 2 times in the past 7 months. Patient states the the social worker from [place] had referred her to the [facility] program that builds ramps.  She hd been in contact with the program and her church has consented to assisting with building the ramp in order for her to get the grant.   Made Inpatient Case Manager aware that [facility] had been following patient in the community. She states patient has been referred to and accepted into the [facility] [place] program. Explained that [facility] can resume after she has completed her program with [facility].  Patient verbalized understanding. Of note, [facility] Care Management services does not replace or interfere with any services that are needed or arranged by inpatient case management or social work.  For additional questions or referrals please contact:  Natividad Brood, RN BSN [facility] Liaison  [phone] business mobile phone Toll free office [phone]

## 2017-12-16 NOTE — Care Management Note (Signed)
Case Management Note  Patient Details  Name: Christy May MRN: [id number] Date of Birth: 10-27-1950  Subjective/Objective:  Presented with increased LE weakness and low back pain. PMHx: DM2, Essential HTN, Anemia, Gout, and recent hospitalization for complicated UTI. From home alone. PTA received home health services from [facility].             Lossie Faes (Daughter) Allayne Gitelman [phone]      PCP: Lucianne Lei  Action/Plan: Pt approved for [facility] 1st Program Plan: Transition to home with [facility] 1st program. RN to do home assessment on tomorrow. Pt lives alone, requires 2 plus assistance with transfers, unable to d/c today 2/2 safety issue.  Pt will need PTAR services arranged for transportation to home.  Expected Discharge Date:  12/17/2017              Expected Discharge Plan:  [place]  In-House Referral:  Clinical Social Work, declined SNF PLACEMENT  Discharge planning Services  CM Consult  Post Acute Care Choice:    Choice offered to:  Patient  DME Arranged:   pt owns wheelchair, walker, rolator, shower chair, [facility]  DME Agency:   N/A  HH Arranged:  PT, OT, RN [place] Agency:  [place]  Status of Service:  Completed, signed off  If discussed at [place] of Stay Meetings, dates discussed:    Additional Comments:  Sharin Mons, RN 12/16/2017, 2:11 PM

## 2017-12-16 NOTE — Discharge Instructions (Signed)
Spinal Stenosis Spinal stenosis occurs when the open space (spinal canal) between the bones of your spine (vertebrae) narrows, putting pressure on the spinal cord or nerves. What are the causes? This condition is caused by areas of bone pushing into the central canals of your vertebrae. This condition may be present at birth (congenital), or it may be caused by:  Arthritic deterioration of your vertebrae (spinal degeneration). This usually starts around age 50.  Injury or trauma to the spine.  Tumors in the spine.  Calcium deposits in the spine.  What are the signs or symptoms? Symptoms of this condition include:  Pain in the neck or back that is generally worse with activities, particularly when standing and walking.  Numbness, tingling, hot or cold sensations, weakness, or weariness in your legs.  Pain going up and down the leg (sciatica).  Frequent episodes of falling.  A foot-slapping gait that leads to muscle weakness.  In more serious cases, you may develop:  Problemspassing stool or passing urine.  Difficulty having sex.  Loss of feeling in part or all of your leg.  Symptoms may come on slowly and get worse over time. How is this diagnosed? This condition is diagnosed based on your medical history and a physical exam. Tests will also be done, such as:  MRI.  CT scan.  X-ray.  How is this treated? Treatment for this condition often focuses on managing your pain and any other symptoms. Treatment may include:  Practicing good posture to lessen pressure on your nerves.  Exercising to strengthen muscles, build endurance, improve balance, and maintain good joint movement (range of motion).  Losing weight, if needed.  Taking medicines to reduce swelling, inflammation, or pain.  Assistive devices, such as a corset or brace.  In some cases, surgery may be needed. The most common procedure is decompression laminectomy. This is done to remove excess bone that  puts pressure on your nerve roots. Follow these instructions at home: Managing pain, stiffness, and swelling  Do all exercises and stretches as told by your health care provider.  Practice good posture. If you were given a brace or a corset, wear it as told by your health care provider.  Do not do any activities that cause pain. Ask your health care provider what activities are safe for you.  Do not lift anything that is heavier than 10 lb (4.5 kg) or the limit that your health care provider tells you.  Maintain a healthy weight. Talk with your health care provider if you need help losing weight.  If directed, apply heat to the affected area as often as told by your health care provider. Use the heat source that your health care provider recommends, such as a moist heat pack or a heating pad. ? Place a towel between your skin and the heat source. ? Leave the heat on for 20-30 minutes. ? Remove the heat if your skin turns bright red. This is especially important if you are not able to feel pain, heat, or cold. You may have a greater risk of getting burned. General instructions  Take over-the-counter and prescription medicines only as told by your health care provider.  Do not use any products that contain nicotine or tobacco, such as cigarettes and e-cigarettes. If you need help quitting, ask your health care provider.  Eat a healthy diet. This includes plenty of fruits and vegetables, whole grains, and low-fat (lean) protein.  Keep all follow-up visits as told by your health care provider.   This is important. Contact a health care provider if:  Your symptoms do not get better or they get worse.  You have a fever. Get help right away if:  You have new or worse pain in your neck or upper back.  You have severe pain that cannot be controlled with medicines.  You are dizzy.  You have vision problems, blurred vision, or double vision.  You have a severe headache that is worse when  you stand.  You have nausea or you vomit.  You develop new or worse numbness or tingling in your back or legs.  You have pain, redness, swelling, or warmth in your arm or leg. Summary  Spinal stenosis occurs when the open space (spinal canal) between the bones of your spine (vertebrae) narrows. This narrowing puts pressure on the spinal cord or nerves.  Spinal stenosis can cause numbness, weakness, or pain in the neck, back, and legs.  This condition may be caused by a birth defect, arthritic deterioration of your vertebrae, injury, tumors, or calcium deposits.  This condition is usually diagnosed with MRIs, CT scans, and X-rays. This information is not intended to replace advice given to you by your health care provider. Make sure you discuss any questions you have with your health care provider. Document Released: 08/29/2003 Document Revised: 05/13/2016 Document Reviewed: 05/13/2016 Elsevier Interactive Patient Education  2018 Elsevier Inc.  

## 2017-12-16 NOTE — Progress Notes (Signed)
[facility] to set patient up with services. RNCM aware.   CSW signing off.  Percell Locus Makoa Satz LCSW [phone]

## 2017-12-17 ENCOUNTER — Other Ambulatory Visit: Payer: Self-pay

## 2017-12-17 DIAGNOSIS — M47816 Spondylosis without myelopathy or radiculopathy, lumbar region: Secondary | ICD-10-CM | POA: Diagnosis not present

## 2017-12-17 DIAGNOSIS — M48061 Spinal stenosis, lumbar region without neurogenic claudication: Secondary | ICD-10-CM | POA: Diagnosis not present

## 2017-12-17 DIAGNOSIS — R279 Unspecified lack of coordination: Secondary | ICD-10-CM | POA: Diagnosis not present

## 2017-12-17 DIAGNOSIS — Z743 Need for continuous supervision: Secondary | ICD-10-CM | POA: Diagnosis not present

## 2017-12-17 LAB — GLUCOSE, CAPILLARY
GLUCOSE-CAPILLARY: 72 mg/dL (ref 70–99)
GLUCOSE-CAPILLARY: 81 mg/dL (ref 70–99)

## 2017-12-17 NOTE — Patient Outreach (Signed)
[facility]) Care Management  12/17/2017  Christy May [birth year] [id number]   Case Closure-client discharged to home with support of the [place] will close case at this time as they are managing client's case at this time.  Plan: primary care updated. update assigned Care Coordinator  Covering RNCM Thea Silversmith, RN, MSN, [place] Coordinator Cell: [phone]

## 2017-12-17 NOTE — Progress Notes (Signed)
Patient's CBG this AM was 72. She received 4 oz orange juice and CBG increased at 81. Hold Insulin Humulin until after breakfast. Will continue to monitor.

## 2017-12-17 NOTE — Progress Notes (Signed)
Pt placed on CPAP for the night.  Tolerating well. 

## 2017-12-17 NOTE — Progress Notes (Signed)
NCM called and arranged pt's transportation to home with PTAR, nurse made aware. Whitman Hero RN,BSN,CM

## 2017-12-17 NOTE — Consult Note (Signed)
   [facility] CM Inpatient Consult   12/17/2017  Christy May March 08, 1951 [id number]    Will send notification to [employer] team to make aware of patient's enrollment in [place] will re-refer back to [place] Management post [facility] completion.    Christy Rolling, MSN-Ed, RN,BSN [facility] Liaison [phone]

## 2017-12-17 NOTE — Progress Notes (Signed)
Patient was discharged home with home health by MD order; discharged instructions review and give to patient with care notes; IV DIC; patient will be transported to her house via [place].

## 2017-12-17 NOTE — Progress Notes (Signed)
Patient could not be discharged yesterday as the aides could not come until this morning.  Patient had an uneventful night.  She however did not eat all of her supper and did not take the late night snack.  As a result her blood glucose level was slightly on the lower side this morning.  She did eat her breakfast.  She may continue with her home medication regimen.  See discharge summary as dictated yesterday.  Bonnielee Haff 12/17/2017

## 2017-12-18 DIAGNOSIS — M47816 Spondylosis without myelopathy or radiculopathy, lumbar region: Secondary | ICD-10-CM | POA: Diagnosis not present

## 2017-12-18 DIAGNOSIS — M48061 Spinal stenosis, lumbar region without neurogenic claudication: Secondary | ICD-10-CM | POA: Diagnosis not present

## 2017-12-20 DIAGNOSIS — M48061 Spinal stenosis, lumbar region without neurogenic claudication: Secondary | ICD-10-CM | POA: Diagnosis not present

## 2017-12-20 DIAGNOSIS — M47816 Spondylosis without myelopathy or radiculopathy, lumbar region: Secondary | ICD-10-CM | POA: Diagnosis not present

## 2017-12-21 ENCOUNTER — Other Ambulatory Visit: Payer: Self-pay | Admitting: Emergency Medicine

## 2017-12-21 DIAGNOSIS — M48061 Spinal stenosis, lumbar region without neurogenic claudication: Secondary | ICD-10-CM | POA: Diagnosis not present

## 2017-12-21 DIAGNOSIS — M47816 Spondylosis without myelopathy or radiculopathy, lumbar region: Secondary | ICD-10-CM | POA: Diagnosis not present

## 2017-12-22 DIAGNOSIS — M47816 Spondylosis without myelopathy or radiculopathy, lumbar region: Secondary | ICD-10-CM | POA: Diagnosis not present

## 2017-12-22 DIAGNOSIS — M48061 Spinal stenosis, lumbar region without neurogenic claudication: Secondary | ICD-10-CM | POA: Diagnosis not present

## 2017-12-24 DIAGNOSIS — M48061 Spinal stenosis, lumbar region without neurogenic claudication: Secondary | ICD-10-CM | POA: Diagnosis not present

## 2017-12-24 DIAGNOSIS — M47816 Spondylosis without myelopathy or radiculopathy, lumbar region: Secondary | ICD-10-CM | POA: Diagnosis not present

## 2017-12-27 DIAGNOSIS — M48061 Spinal stenosis, lumbar region without neurogenic claudication: Secondary | ICD-10-CM | POA: Diagnosis not present

## 2017-12-27 DIAGNOSIS — M47816 Spondylosis without myelopathy or radiculopathy, lumbar region: Secondary | ICD-10-CM | POA: Diagnosis not present

## 2017-12-28 DIAGNOSIS — M48061 Spinal stenosis, lumbar region without neurogenic claudication: Secondary | ICD-10-CM | POA: Diagnosis not present

## 2017-12-28 DIAGNOSIS — I10 Essential (primary) hypertension: Secondary | ICD-10-CM | POA: Diagnosis not present

## 2017-12-28 DIAGNOSIS — I110 Hypertensive heart disease with heart failure: Secondary | ICD-10-CM | POA: Diagnosis not present

## 2017-12-28 DIAGNOSIS — M4800 Spinal stenosis, site unspecified: Secondary | ICD-10-CM | POA: Diagnosis not present

## 2017-12-28 DIAGNOSIS — M47816 Spondylosis without myelopathy or radiculopathy, lumbar region: Secondary | ICD-10-CM | POA: Diagnosis not present

## 2017-12-28 DIAGNOSIS — G4730 Sleep apnea, unspecified: Secondary | ICD-10-CM | POA: Diagnosis not present

## 2017-12-28 DIAGNOSIS — K210 Gastro-esophageal reflux disease with esophagitis: Secondary | ICD-10-CM | POA: Diagnosis not present

## 2017-12-28 DIAGNOSIS — Z6841 Body Mass Index (BMI) 40.0 and over, adult: Secondary | ICD-10-CM | POA: Diagnosis not present

## 2017-12-28 DIAGNOSIS — M797 Fibromyalgia: Secondary | ICD-10-CM | POA: Diagnosis not present

## 2017-12-30 DIAGNOSIS — M47816 Spondylosis without myelopathy or radiculopathy, lumbar region: Secondary | ICD-10-CM | POA: Diagnosis not present

## 2017-12-30 DIAGNOSIS — M48061 Spinal stenosis, lumbar region without neurogenic claudication: Secondary | ICD-10-CM | POA: Diagnosis not present

## 2017-12-31 DIAGNOSIS — M47816 Spondylosis without myelopathy or radiculopathy, lumbar region: Secondary | ICD-10-CM | POA: Diagnosis not present

## 2017-12-31 DIAGNOSIS — M48061 Spinal stenosis, lumbar region without neurogenic claudication: Secondary | ICD-10-CM | POA: Diagnosis not present

## 2018-01-14 ENCOUNTER — Telehealth: Payer: Self-pay | Admitting: Emergency Medicine

## 2018-01-14 ENCOUNTER — Other Ambulatory Visit: Payer: Self-pay | Admitting: *Deleted

## 2018-01-14 NOTE — Patient Outreach (Signed)
[facility]) Care Management  01/14/2018  Christy May 08-12-1950 [id number]   J. Juleen China, Transport planner, received call from member stating [place] program was no longer working with her and that she needed regular home health restarted.  Per Ms. Juleen China, member has already contacted primary MD office to request referral.   Call placed to [facility], spoke with [facility] regarding member's activity. She state member has not been active with them since 7/17.  She report member was not 100% cooperative with therapy sessions and presented a hazard for the staff due to the level of care she needed.  There were multiple discussions regarding placement for long term care, bed was offered at [place], but per [facility], member refused to go.  Member report not wanting to give away her check.  [facility] closed program due to inability to progress through the program and the need for a higher lever of care.  Call placed to member to follow up on current health status.  State she has spoken with [name] at MD office regarding her needs, waiting on call back.  Report she is in need of more support in the home as well as therapy.  She is again requesting assistance from [facility] for these services.  Member reminded that she is not eligible for paid services due to her income and she would be responsible financially. She is also reminded that she is in need of an increased level of care and safety is of much concern.  She again state she will not consider placement.  She is informed that this care manager will discuss case with multidisciplinary team and [facility] leaders.  She will also discuss her case with spine specialist today and request suggestions.  She has secured transportation through [place], but will have to call EMS to come help her get out of the house.  She will also continue to call them when/if she fall.   Will present to team for case discussion and follow up with member within the next  week.  [name], [place], MSN [place] [phone]

## 2018-01-14 NOTE — Telephone Encounter (Signed)
Pt called and stated he blood sugars have been running high all week. She is currently not taking the Metformin because she is out of it. She takes 120 units of insulins. Please advise thanks.

## 2018-01-17 ENCOUNTER — Other Ambulatory Visit: Payer: Self-pay | Admitting: Endocrinology

## 2018-01-17 MED ORDER — METFORMIN HCL 500 MG PO TABS: 500.0000 mg | ORAL_TABLET | Freq: Two times a day (BID) | ORAL | 0 refills | Status: DC

## 2018-01-17 NOTE — Telephone Encounter (Signed)
I called patient & let her know that prescription was sent in to resume metformin. I asked that she call back if she continued to see high blood sugars.

## 2018-01-17 NOTE — Telephone Encounter (Signed)
Please advise if patient should be on metformin you were not the last none to prescribe? Please advise?

## 2018-01-17 NOTE — Telephone Encounter (Signed)
Ok, I have sent a prescription to your pharmacy, to resume the metformin

## 2018-01-19 ENCOUNTER — Other Ambulatory Visit: Payer: Self-pay | Admitting: *Deleted

## 2018-01-19 NOTE — Patient Outreach (Signed)
[facility]) Care Management  01/19/2018  DEVEN AUDI Feb 14, 1951 [id number]   Call placed to member to follow up on home health services restarting.  No answer, HIPAA compliant voice message left.  Unsuccessful outreach letter sent, will follow up within the next 4 business days.  [name], [place], MSN [place] [phone]

## 2018-01-24 ENCOUNTER — Other Ambulatory Visit: Payer: Self-pay | Admitting: *Deleted

## 2018-01-24 NOTE — Patient Outreach (Signed)
[facility] [facility]) Care Management  01/24/2018  Christy May July 01, 1950 [id number]   Call placed to member to follow up on current health condition and home health involvement.  She report home health has not restarted.  State she has been in contact with MD office and an agency (unsure of the name) but was told that they could not provide services due to the level of care needed.  Advised that she is in need of placement.  Member state that she does have another contact number for a different agency that state they could provide services, however would need order from MD. She is also unsure of this agency name.    Has been getting minimal help from neighbors and church members in the interim, but agrees she is in need of more assistance that what has been provided.  Remains adamant about placement.  Agrees to have this care manager visit the home tomorrow, will help member with reaching out to agencies and MD office in effort to receive home health.  [name], [place], MSN [place] [phone]

## 2018-01-25 ENCOUNTER — Other Ambulatory Visit: Payer: Self-pay | Admitting: *Deleted

## 2018-01-25 ENCOUNTER — Encounter: Payer: Self-pay | Admitting: *Deleted

## 2018-01-25 NOTE — Patient Outreach (Signed)
[facility] [facility]) Care Management   01/25/2018  Christy May [birth year] [id number]  Christy May is an 67 y.o. female  Subjective:   Member alert and oriented x3, complains of chronic pain.  Report compliance with medications, state she has hired someone to come in the home for 2 hours in the morning and 2 hours in the evening, Monday-Friday for aide services.  Last visit with primary MD 7/2.  Objective:   Review of Systems  Constitutional: Negative.   HENT: Negative.   Eyes: Negative.   Respiratory: Negative.   Cardiovascular: Positive for leg swelling.  Gastrointestinal: Negative.   Musculoskeletal: Positive for falls and myalgias.  Skin: Negative.   Neurological: Positive for weakness.  Psychiatric/Behavioral: Negative.     Physical Exam  Constitutional: She is oriented to person, place, and time. She appears well-developed and well-nourished.  Neck: Normal range of motion.  Cardiovascular: Normal rate, regular rhythm and normal heart sounds.  Respiratory: Effort normal and breath sounds normal.  GI: Soft. Bowel sounds are normal.  Neurological: She is alert and oriented to person, place, and time.  Skin: Skin is warm and dry.   BP 120/72 (BP Location: Left Arm, Patient Position: Supine, Cuff Size: Large)   Pulse 98   Resp (!) 22   Ht 1.626 m ('5\' 4"' )   Wt (!) 314 lb (142.4 kg) Comment: Per last hospital admission  SpO2 95%   BMI 53.90 kg/m   Encounter Medications:   Outpatient Encounter Medications as of 01/25/2018  Medication Sig Note  . allopurinol (ZYLOPRIM) 300 MG tablet Take 300 mg by mouth daily.   Marland Kitchen aspirin EC 81 MG tablet Take 81 mg by mouth at bedtime.   . Cholecalciferol (VITAMIN D-3) 1000 units CAPS Take 2,000 Units by mouth daily.   . cyclobenzaprine (FLEXERIL) 10 MG tablet Take 10 mg by mouth daily.   . cycloSPORINE (RESTASIS) 0.05 % ophthalmic emulsion Place 1 drop into both eyes 2 (two) times daily.    . fluticasone (FLONASE)  50 MCG/ACT nasal spray SHAKE LIQUID AND USE 2 SPRAYS IN EACH NOSTRIL DAILY   . gabapentin (NEURONTIN) 800 MG tablet Take 800 mg by mouth 3 (three) times daily.   . Glucosamine-Chondroitin (GLUCOSAMINE CHONDR COMPLEX PO) Take 1 tablet by mouth daily.   Marland Kitchen HYDROcodone-acetaminophen (NORCO/VICODIN) 5-325 MG tablet Take 1 tablet by mouth every 6 (six) hours as needed.   . insulin NPH Human (NOVOLIN N RELION) 100 UNIT/ML injection Inject 1.2 mLs (120 Units total) into the skin every morning.   . Insulin Syringe-Needle U-100 (INSULIN SYRINGE 1CC/31GX5/16") 31G X 5/16" 1 ML MISC USE TO INJECT INSULIN TWICE DAILY   . ipratropium (ATROVENT) 0.03 % nasal spray USE 2 SPRAYS IN EACH NOSTRIL THREE TIMES DAILY AS NEEDED FOR RHINITIS   . irbesartan (AVAPRO) 300 MG tablet Take 300 mg by mouth daily.   Marland Kitchen loratadine (CLARITIN) 10 MG tablet Take 10 mg by mouth daily.   . metFORMIN (GLUCOPHAGE) 500 MG tablet Take 1 tablet (500 mg total) by mouth 2 (two) times daily with a meal.   . metoprolol (LOPRESSOR) 50 MG tablet Take 75 mg by mouth 2 (two) times daily.   . montelukast (SINGULAIR) 10 MG tablet TAKE 1 TABLET(10 MG) BY MOUTH DAILY   . Multiple Vitamins-Minerals (MULTIVITAMIN WITH MINERALS) tablet Take 1 tablet by mouth daily.   Marland Kitchen nystatin cream (MYCOSTATIN) Apply 1 application topically 2 (two) times daily as needed for dry skin (skin irritation).   Marland Kitchen  ONE TOUCH LANCETS MISC Use to check sugars twice daily DX E11.22   . oxybutynin (DITROPAN-XL) 10 MG 24 hr tablet Take 1 tablet (10 mg total) by mouth daily.   . pantoprazole (PROTONIX) 40 MG tablet TAKE 1 TABLET(40 MG) BY MOUTH TWICE DAILY   . predniSONE (DELTASONE) 10 MG tablet Take 2 tablets (20 mg total) by mouth daily with breakfast.   . ranitidine (ZANTAC) 300 MG tablet Take 1 tablet (300 mg total) by mouth 2 (two) times daily.   . sodium chloride (OCEAN) 0.65 % SOLN nasal spray Place 2 sprays into both nostrils 2 (two) times daily.   . traMADol (ULTRAM) 50 MG  tablet Take 50 mg by mouth every 12 (twelve) hours as needed for moderate pain.   . TURMERIC PO Take 1 tablet by mouth daily.   Marland Kitchen albuterol (PROVENTIL) (2.5 MG/3ML) 0.083% nebulizer solution Take 2.5 mg by nebulization 4 (four) times daily as needed for wheezing or shortness of breath.    . folic acid (FOLVITE) 1 MG tablet Take 1 mg by mouth once a day (Patient not taking: Reported on 01/25/2018)   . Lidocaine-Menthol (LIMENCIN) 4-4 % PTCH Apply 1 patch topically See admin instructions. Apply 1 patch daily to the right shoulder/remove each evening 12/14/2017: Out of medication   No facility-administered encounter medications on file as of 01/25/2018.     Functional Status:   In your present state of health, do you have any difficulty performing the following activities: 01/25/2018 11/17/2017  Hearing? N Y  Vision? Y N  Difficulty concentrating or making decisions? N N  Walking or climbing stairs? Y Y  Dressing or bathing? Y Y  Doing errands, shopping? Christy May  Preparing Food and eating ? Y -  Using the Toilet? Y -  In the past six months, have you accidently leaked urine? Y -  Do you have problems with loss of bowel control? Y -  Managing your Medications? N -  Managing your Finances? N -  Housekeeping or managing your Housekeeping? Y -  Some recent data might be hidden    Fall/Depression Screening:    Fall Risk  01/25/2018 08/23/2017 06/24/2017  Falls in the past year? Yes Yes Yes  Number falls in past yr: 2 or more 2 or more 1  Injury with Fall? Yes No Yes  Risk Factor Category  High Fall Risk - High Fall Risk  Comment - - -  Risk for fall due to : History of fall(s);Impaired mobility Impaired balance/gait;History of fall(s);Impaired mobility History of fall(s);Impaired balance/gait;Impaired mobility  Follow up Education provided;Falls prevention discussed Falls prevention discussed Education provided;Falls prevention discussed   PHQ 2/9 Scores 01/25/2018 08/23/2017 06/24/2017 03/02/2017 11/24/2016  11/11/2016 01/25/2015  PHQ - 2 Score 1 2 0 0 0 0 0  PHQ- 9 Score - 7 - - - - -    Assessment:    Met with member at scheduled time.  She expresses frustration regarding multiple agencies discontinuing services with her due to her required level of care.  This care manager also expresses concern with the level of care as member does not have assistance 24 hours a day.  She has provided multiple neighbors with keys to her home as well as the aide.  Report the fire department has access to a key in the event she has a fall and they have to help pick her up.    At this time, member is bed bound, incontinent with urine & stool.  Aide  is in the home 9am-11am and 5pm-7pm.  State she has aide leave food at the bedside (sandwiches, crackers, etc) or she will order food to be delivered (leaving the door unlocked for delivery drivers to come in).  She is aware of how unsafe her situation is and the need for increased level of care, again she refuses to consider placement.  She has not had PT/OT since 7/17 when [facility] discharged her from the program.  She report she has spoken to [facility] and they state they will be able to offer services with an order.  Call placed to primary MD office, spoke to [facility] regarding concerns and need for order for home health through [place].  She verbalizes order has been placed.  Member made aware of order, but also made aware that this is not a long term solution.  She verbalizes understanding.    Plan:   Will discuss difficulty managing member's case with multidisciplinary team this week.   Will follow up with member after discussion, within the next week. Care plan pending suggestions from team.  [name], RN, MSN [place] Manager [phone]

## 2018-01-28 DIAGNOSIS — M542 Cervicalgia: Secondary | ICD-10-CM | POA: Diagnosis not present

## 2018-01-28 DIAGNOSIS — R531 Weakness: Secondary | ICD-10-CM | POA: Diagnosis not present

## 2018-01-31 ENCOUNTER — Other Ambulatory Visit: Payer: Self-pay | Admitting: Neurosurgery

## 2018-01-31 DIAGNOSIS — R531 Weakness: Secondary | ICD-10-CM

## 2018-02-01 ENCOUNTER — Ambulatory Visit: Payer: Self-pay | Admitting: *Deleted

## 2018-02-01 ENCOUNTER — Other Ambulatory Visit: Payer: Self-pay | Admitting: *Deleted

## 2018-02-01 NOTE — Patient Outreach (Signed)
[facility] [facility]) Care Management  02/01/2018  RAYLEIGH GILLYARD [birth year] [id number]   Call placed to member to discuss suggestions (have discussion regarding severity of member's health condition with daughter, other family, and MD) from multidisciplinary team for plan of care.  Member report that she no longer have the aide coming in daily due to concern for safety.  State she has been having other friends come in daily to provide assistance, however she still does not have a distinct plan of care or person that is able to assist.  She is still looking into other options where she will be able to stay in her home.    This care manager inquired about having her family involved in making decisions, however she state that none of them are available as they all have other concerns that warrant their attention at this time.  Daughter in [place] (in between jobs), member state moving with her or daughter moving to [place] is "out of the question."  She report her daughter has already had conversations with herself and MD, daughter also want member placed and is reportedly upset with member because she refuses.  She has a sister in [place] (husband has cancer), brother in [place] (has cancer), brother in [place] (need knee replacement).  Member state she has researched [facility] as an option.  She is advised that they will also consider the level of care needed in the home as well as research support in the home.  She verbalizes understanding.   She complains of pain in her back/neck, stating she feel she may need to go to the ED if the pain is not resolved.  Dr. Arnoldo Morale (spine specialist) initially scheduled for 8/20, went 8/9 instead.  Need MRI, weakness may be coming from neck rather than spinal stenosis.  [facility] scheduled appointment for 8/23 but she does not feel she can wait that long.  Awaiting call from Dr. Arnoldo Morale office to give permission to go to hospital for walk in  appointment for MRI.  Will call EMS for transport.  Denies receiving call from [facility] home care yet to start services.  Will follow up within the next week.  [name], [place], MSN [place] [phone]

## 2018-02-02 ENCOUNTER — Other Ambulatory Visit: Payer: Self-pay | Admitting: Emergency Medicine

## 2018-02-02 DIAGNOSIS — Z87891 Personal history of nicotine dependence: Secondary | ICD-10-CM | POA: Diagnosis not present

## 2018-02-02 DIAGNOSIS — I10 Essential (primary) hypertension: Secondary | ICD-10-CM | POA: Diagnosis not present

## 2018-02-02 DIAGNOSIS — E1140 Type 2 diabetes mellitus with diabetic neuropathy, unspecified: Secondary | ICD-10-CM | POA: Diagnosis not present

## 2018-02-02 DIAGNOSIS — Z794 Long term (current) use of insulin: Secondary | ICD-10-CM | POA: Diagnosis not present

## 2018-02-03 ENCOUNTER — Other Ambulatory Visit: Payer: Self-pay | Admitting: *Deleted

## 2018-02-03 DIAGNOSIS — I10 Essential (primary) hypertension: Secondary | ICD-10-CM | POA: Diagnosis not present

## 2018-02-03 DIAGNOSIS — Z794 Long term (current) use of insulin: Secondary | ICD-10-CM | POA: Diagnosis not present

## 2018-02-03 DIAGNOSIS — E1140 Type 2 diabetes mellitus with diabetic neuropathy, unspecified: Secondary | ICD-10-CM | POA: Diagnosis not present

## 2018-02-03 DIAGNOSIS — Z87891 Personal history of nicotine dependence: Secondary | ICD-10-CM | POA: Diagnosis not present

## 2018-02-03 NOTE — Patient Outreach (Signed)
[facility]) Care Management  02/03/2018  KENDRE SIRES February 25, 1951 [id number]   Call placed to member to follow up on order for home health.  She report she had visit yesterday, sessions will start for nursing, PT, OT, and aide.  She will have visits multiple times a week, expresses confidence that her strength will improve and she will continue to be able to live independently.  State she has MD appointment next week, plan to use [facility] for transportation and will have either EMS or fire department come to help get her out and back into the house.  Denies any urgent needs at this time, will follow up within the next 2 weeks.  [name], [place], MSN [place] [phone]

## 2018-02-05 ENCOUNTER — Encounter (HOSPITAL_COMMUNITY): Payer: Self-pay | Admitting: Emergency Medicine

## 2018-02-05 ENCOUNTER — Emergency Department (HOSPITAL_COMMUNITY)
Admission: EM | Admit: 2018-02-05 | Discharge: 2018-02-06 | Disposition: A | Discharge status: Home / Self Care | Payer: Medicare Other | Attending: Emergency Medicine | Admitting: Emergency Medicine

## 2018-02-05 ENCOUNTER — Other Ambulatory Visit: Payer: Self-pay

## 2018-02-05 DIAGNOSIS — E1165 Type 2 diabetes mellitus with hyperglycemia: Secondary | ICD-10-CM | POA: Diagnosis not present

## 2018-02-05 DIAGNOSIS — Z79899 Other long term (current) drug therapy: Secondary | ICD-10-CM | POA: Diagnosis not present

## 2018-02-05 DIAGNOSIS — R110 Nausea: Secondary | ICD-10-CM | POA: Insufficient documentation

## 2018-02-05 DIAGNOSIS — Z87891 Personal history of nicotine dependence: Secondary | ICD-10-CM | POA: Insufficient documentation

## 2018-02-05 DIAGNOSIS — Z96653 Presence of artificial knee joint, bilateral: Secondary | ICD-10-CM | POA: Insufficient documentation

## 2018-02-05 DIAGNOSIS — I10 Essential (primary) hypertension: Secondary | ICD-10-CM | POA: Insufficient documentation

## 2018-02-05 DIAGNOSIS — Z794 Long term (current) use of insulin: Secondary | ICD-10-CM | POA: Diagnosis not present

## 2018-02-05 DIAGNOSIS — R0902 Hypoxemia: Secondary | ICD-10-CM | POA: Diagnosis not present

## 2018-02-05 DIAGNOSIS — R42 Dizziness and giddiness: Secondary | ICD-10-CM | POA: Insufficient documentation

## 2018-02-05 DIAGNOSIS — E1140 Type 2 diabetes mellitus with diabetic neuropathy, unspecified: Secondary | ICD-10-CM | POA: Diagnosis not present

## 2018-02-05 DIAGNOSIS — J449 Chronic obstructive pulmonary disease, unspecified: Secondary | ICD-10-CM | POA: Insufficient documentation

## 2018-02-05 DIAGNOSIS — Z7982 Long term (current) use of aspirin: Secondary | ICD-10-CM | POA: Insufficient documentation

## 2018-02-05 NOTE — ED Triage Notes (Signed)
Pt BIB EMS from home with complaints of dizziness. Patient is bed bound and while she was being rolled and cleaned up she became dizzy. A&O x4. No CP, SOB, vomiting. Leg pain is chronic.

## 2018-02-05 NOTE — ED Notes (Signed)
Bed: WA21 Expected date:  Expected time:  Means of arrival:  Comments: EMS 67 yo female dizziness

## 2018-02-06 ENCOUNTER — Emergency Department (HOSPITAL_COMMUNITY): Payer: Medicare Other

## 2018-02-06 DIAGNOSIS — M2550 Pain in unspecified joint: Secondary | ICD-10-CM | POA: Diagnosis not present

## 2018-02-06 DIAGNOSIS — Z7401 Bed confinement status: Secondary | ICD-10-CM | POA: Diagnosis not present

## 2018-02-06 DIAGNOSIS — R42 Dizziness and giddiness: Secondary | ICD-10-CM | POA: Diagnosis not present

## 2018-02-06 DIAGNOSIS — R5381 Other malaise: Secondary | ICD-10-CM | POA: Diagnosis not present

## 2018-02-06 LAB — URINALYSIS, ROUTINE W REFLEX MICROSCOPIC
Bacteria, UA: NONE SEEN
Bilirubin Urine: NEGATIVE
Glucose, UA: >=500 mg/dL — AB
Hgb urine dipstick: NEGATIVE
KETONES UR: NEGATIVE mg/dL
LEUKOCYTES UA: NEGATIVE
Nitrite: NEGATIVE
PROTEIN: NEGATIVE mg/dL
Specific Gravity, Urine: 1.018 (ref 1.005–1.030)
pH: 5 (ref 5.0–8.0)

## 2018-02-06 LAB — CBC WITH DIFFERENTIAL/PLATELET
BASOS ABS: 0 10*3/uL (ref 0.0–0.1)
Basophils Relative: 0 %
Eosinophils Absolute: 0 10*3/uL (ref 0.0–0.7)
Eosinophils Relative: 0 %
HEMATOCRIT: 41.4 % (ref 36.0–46.0)
Hemoglobin: 13.1 g/dL (ref 12.0–15.0)
LYMPHS PCT: 18 %
Lymphs Abs: 2 10*3/uL (ref 0.7–4.0)
MCH: 28.9 pg (ref 26.0–34.0)
MCHC: 31.6 g/dL (ref 30.0–36.0)
MCV: 91.4 fL (ref 78.0–100.0)
MONO ABS: 0.7 10*3/uL (ref 0.1–1.0)
Monocytes Relative: 6 %
NEUTROS ABS: 8.3 10*3/uL — AB (ref 1.7–7.7)
Neutrophils Relative %: 76 %
Platelets: 303 10*3/uL (ref 150–400)
RBC: 4.53 MIL/uL (ref 3.87–5.11)
RDW: 14.7 % (ref 11.5–15.5)
WBC: 11 10*3/uL — ABNORMAL HIGH (ref 4.0–10.5)

## 2018-02-06 LAB — BASIC METABOLIC PANEL
ANION GAP: 13 (ref 5–15)
BUN: 22 mg/dL (ref 8–23)
CHLORIDE: 99 mmol/L (ref 98–111)
CO2: 28 mmol/L (ref 22–32)
Calcium: 10.1 mg/dL (ref 8.9–10.3)
Creatinine, Ser: 1.05 mg/dL — ABNORMAL HIGH (ref 0.44–1.00)
GFR calc Af Amer: >60 mL/min (ref >60)
GFR calc non Af Amer: 53 mL/min — ABNORMAL LOW (ref >60)
GLUCOSE: 302 mg/dL — AB (ref 70–99)
POTASSIUM: 5.2 mmol/L — AB (ref 3.5–5.1)
Sodium: 140 mmol/L (ref 135–145)

## 2018-02-06 LAB — CBG MONITORING, ED
GLUCOSE-CAPILLARY: 55 mg/dL — AB (ref 70–99)
GLUCOSE-CAPILLARY: 104 mg/dL — AB (ref 70–99)
Glucose-Capillary: 150 mg/dL — ABNORMAL HIGH (ref 70–99)

## 2018-02-06 MED ORDER — IRBESARTAN 300 MG PO TABS
300.0000 mg | ORAL_TABLET | Freq: Every day | ORAL | Status: DC
  Administered 2018-02-06: 300 mg via ORAL
  Filled 2018-02-06: qty 1

## 2018-02-06 MED ORDER — ACETAMINOPHEN 325 MG PO TABS
650.0000 mg | ORAL_TABLET | Freq: Once | ORAL | Status: AC
Start: 2018-02-06 — End: 2018-02-06
  Administered 2018-02-06: 650 mg via ORAL
  Filled 2018-02-06: qty 2

## 2018-02-06 MED ORDER — METFORMIN HCL 500 MG PO TABS
500.0000 mg | ORAL_TABLET | Freq: Once | ORAL | Status: AC
  Administered 2018-02-06: 500 mg via ORAL
  Filled 2018-02-06: qty 1

## 2018-02-06 MED ORDER — ONDANSETRON 8 MG PO TBDP: 8.0000 mg | ORAL_TABLET | Freq: Three times a day (TID) | ORAL | 0 refills | Status: DC | PRN

## 2018-02-06 MED ORDER — ONDANSETRON 4 MG PO TBDP
4.0000 mg | ORAL_TABLET | Freq: Once | ORAL | Status: AC
  Administered 2018-02-06: 4 mg via ORAL
  Filled 2018-02-06: qty 1

## 2018-02-06 MED ORDER — LORAZEPAM 2 MG/ML IJ SOLN
0.5000 mg | Freq: Once | INTRAMUSCULAR | Status: AC
  Administered 2018-02-06: 0.5 mg via INTRAVENOUS
  Filled 2018-02-06: qty 1

## 2018-02-06 MED ORDER — MECLIZINE HCL 25 MG PO TABS: 25.0000 mg | ORAL_TABLET | Freq: Three times a day (TID) | ORAL | 0 refills | Status: DC | PRN

## 2018-02-06 MED ORDER — MECLIZINE HCL 25 MG PO TABS
12.5000 mg | ORAL_TABLET | Freq: Once | ORAL | Status: AC
  Administered 2018-02-06: 12.5 mg via ORAL
  Filled 2018-02-06: qty 1

## 2018-02-06 MED ORDER — ONDANSETRON HCL 4 MG/2ML IJ SOLN
4.0000 mg | Freq: Once | INTRAMUSCULAR | Status: AC
  Administered 2018-02-06: 4 mg via INTRAVENOUS
  Filled 2018-02-06: qty 2

## 2018-02-06 MED ORDER — METOPROLOL TARTRATE 25 MG PO TABS
75.0000 mg | ORAL_TABLET | Freq: Once | ORAL | Status: AC
  Administered 2018-02-06: 75 mg via ORAL
  Filled 2018-02-06: qty 3

## 2018-02-06 MED ORDER — GADOBENATE DIMEGLUMINE 529 MG/ML IV SOLN
20.0000 mL | Freq: Once | INTRAVENOUS | Status: AC | PRN
  Administered 2018-02-06: 20 mL via INTRAVENOUS

## 2018-02-06 MED ORDER — SODIUM CHLORIDE 0.9 % IV BOLUS
500.0000 mL | Freq: Once | INTRAVENOUS | Status: AC
  Administered 2018-02-06: 500 mL via INTRAVENOUS

## 2018-02-06 NOTE — ED Provider Notes (Signed)
MRI without acute abnormalities.  Consistent with peripheral vertigo.  WIll rx meclizine and tylenol.  BP has improved with medications in the ED   Dorie Rank, MD 02/06/18 1201

## 2018-02-06 NOTE — ED Notes (Signed)
Patient is on purewick due to incontinence.

## 2018-02-06 NOTE — Discharge Instructions (Signed)
Take the medications as needed for nausea and dizziness, avoid any sudden movements of your head, symptoms should be improving over the next few days,

## 2018-02-06 NOTE — ED Notes (Signed)
Patient has not urinated yet for a urine sample.

## 2018-02-06 NOTE — ED Notes (Signed)
Call and spoke to [facility] to let her know we have patient that need MRI today.

## 2018-02-06 NOTE — ED Provider Notes (Addendum)
Christy May   CSN: [id number] Arrival date & time: 02/05/18  2331     History   Chief Complaint Chief Complaint  Patient presents with  . Dizziness    HPI Christy May is a 67 y.o. female.  Patient presents to the emergency department for evaluation of dizziness.  Patient reports sudden onset of spinning sensation when she was rolled over to her right side while getting a sponge bath.  Patient reports that she has had severe dizziness and spinning sensation with any movements of her head since this began this evening.  Dizziness is causing nausea but no vomiting.  She does not have headache.  She has never had similar symptoms.     Past Medical History:  Diagnosis Date  . Allergy   . Anemia, unspecified   . Anxiety   . Arthritis    "neck, back, hands" (09/03/2014)  . Chronic airway obstruction, not elsewhere classified    "I was told I don't have this/tests done @ [facility] 05/2014"  . Chronic back pain   . Colon polyps   . Complication of anesthesia    "I've had recall; I probably stopped breathing at least 2 times" (09/03/2014)  . Dehydration   . Depressive disorder, not elsewhere classified   . Diabetic peripheral neuropathy ([place])   . Dysphagia 2007   historyof dysphagia with severe dysmotility by barium swallow-Christy May  . Esophageal reflux   . Family history of adverse reaction to anesthesia    "my brother was told they lost him a couple times during OR"  . Heart murmur   . History of hiatal hernia   . Migraine    "couple times/month right now" (09/03/2014)  . Obesity, unspecified   . OSA (obstructive sleep apnea)     CPAP  . Septic shock ([place]) 08/30/2017  . Spondylosis of unspecified site without mention of myelopathy   . Type II diabetes mellitus ([place])   . Unspecified essential hypertension   . Unspecified menopausal and postmenopausal disorder   . Unspecified vitamin D deficiency   . Upper airway  cough syndrome     Patient Active Problem List   Diagnosis Date Noted  . DM neuropathy, type II diabetes mellitus ([place]) 11/18/2017  . Weakness 11/18/2017  . SIRS (systemic inflammatory response syndrome) ([place]) 11/17/2017  . Diabetes mellitus type 2 in obese ([place]) 11/17/2017  . Acute lower UTI 11/17/2017  . Septic shock ([place]) 08/30/2017  . Acute renal failure (ARF) ([place]) 08/13/2017  . Pressure injury of skin 08/13/2017  . Sepsis due to gram-negative UTI ([place]) 08/12/2017  . Upper airway cough syndrome 09/28/2016  . Acute bronchitis 10/29/2015  . Diabetes ([place]) 08/21/2015  . Chest pain 07/14/2015  . Dyspnea on exertion 07/05/2015  . Obstructive sleep apnea 07/05/2015  . Constipation 03/05/2015  . Allergic rhinitis 03/05/2015  . Hearing loss 03/05/2015  . Vocal cord dysfunction 03/05/2015  . Pain in joint, shoulder region 09/11/2014  . Weakness generalized 09/02/2014  . Acute esophagitis 05/08/2014  . Benign neoplasm of sigmoid colon 05/08/2014  . Change in bowel habits 04/17/2014  . Fecal incontinence 04/17/2014  . Urge incontinence 04/08/2014  . Cough 12/06/2013  . Orthostasis 07/18/2013  . Acute kidney injury ([place]) 07/18/2013  . Bilateral carpal tunnel syndrome 02/14/2013  . Gout 01/04/2013  . Failure to thrive 06/28/2012  . Gait disorder 06/28/2012  . Lower abdominal pain 12/14/2011  . Preventative health care 08/23/2011  . Cervical radiculopathy 02/11/2011  .  Low back pain 02/11/2011  . VITAMIN D DEFICIENCY 11/22/2009  . ANEMIA-NOS 11/22/2009  . DEGENERATIVE JOINT DISEASE, CERVICAL SPINE 11/22/2009  . Depression 09/16/2009  . POLYNEUROPATHY 09/16/2009  . Morbid obesity ([place]) 09/09/2009  . Essential hypertension 09/09/2009  . GERD 09/09/2009  . Sleep apnea 09/09/2009    Past Surgical History:  Procedure Laterality Date  . ABDOMINAL HYSTERECTOMY     "partial"  . ANTERIOR CERVICAL DECOMP/DISCECTOMY FUSION     multiple cervical spine levels;Dr. Arnoldo Morale  .  APPENDECTOMY    . BACK SURGERY    . CHOLECYSTECTOMY OPEN    . COLONOSCOPY N/A 05/08/2014   Procedure: COLONOSCOPY;  Surgeon: [name], MD;  Location: [facility] END[facility]COPY;  Service: Endoscopy;  Laterality: N/A;  . DILATION AND CURETTAGE OF UTERUS    . ESOPHAGOGASTRODUODENOSCOPY N/A 05/08/2014   Procedure: ESOPHAGOGASTRODUODENOSCOPY (EGD);  Surgeon: [name], MD;  Location: [name];  Service: Endoscopy;  Laterality: N/A;  . EXPLORATORY LAPAROTOMY    . EYE SURGERY    . FOOT SURGERY Right X 2   "took blood out of my arms and put platelets in my feet"  . INCISION AND DRAINAGE ABSCESS     Chest  . JOINT REPLACEMENT    . REFRACTIVE SURGERY Bilateral   . TONSILLECTOMY    . TOTAL KNEE ARTHROPLASTY Left 10/2003  . TOTAL KNEE ARTHROPLASTY Right 01/2004   [name]     OB History   None      Home Medications    Prior to Admission medications   Medication Sig Start Date End Date Taking? Authorizing Provider  albuterol (PROVENTIL) (2.5 MG/3ML) 0.083% nebulizer solution Take 2.5 mg by nebulization 4 (four) times daily as needed for wheezing or shortness of breath.     [provider]  allopurinol (ZYLOPRIM) 300 MG tablet Take 300 mg by mouth daily.    [provider]  aspirin EC 81 MG tablet Take 81 mg by mouth at bedtime.    [provider]  Cholecalciferol (VITAMIN D-3) 1000 units CAPS Take 2,000 Units by mouth daily.    [provider]  cyclobenzaprine (FLEXERIL) 10 MG tablet Take 10 mg by mouth daily. 12/06/17   [provider]  cycloSPORINE (RESTASIS) 0.05 % ophthalmic emulsion Place 1 drop into both eyes 2 (two) times daily.     [provider]  fluticasone (FLONASE) 50 MCG/ACT nasal spray SHAKE LIQUID AND USE 2 SPRAYS IN EACH NOSTRIL DAILY 03/08/17   Byrum, Rose Fillers, MD  folic acid (FOLVITE) 1 MG tablet Take 1 mg by mouth once a day Patient not taking: Reported on 01/25/2018 09/03/17   Aline August, MD  gabapentin (NEURONTIN)  800 MG tablet Take 800 mg by mouth 3 (three) times daily.    [provider]  Glucosamine-Chondroitin (GLUCOSAMINE CHONDR COMPLEX PO) Take 1 tablet by mouth daily.    [provider]  HYDROcodone-acetaminophen (NORCO/VICODIN) 5-325 MG tablet Take 1 tablet by mouth every 6 (six) hours as needed. 12/09/17   Maczis, Barth Kirks, PA-C  insulin NPH Human (NOVOLIN N RELION) 100 UNIT/ML injection Inject 1.2 mLs (120 Units total) into the skin every morning. 03/09/17   Renato Shin, MD  Insulin Syringe-Needle U-100 (INSULIN SYRINGE 1CC/31GX5/16") 31G X 5/16" 1 ML MISC USE TO INJECT INSULIN TWICE DAILY 05/11/17   Renato Shin, MD  ipratropium (ATROVENT) 0.03 % nasal spray USE 2 SPRAYS IN EACH NOSTRIL THREE TIMES DAILY AS NEEDED FOR RHINITIS 03/17/17   Collene Gobble, MD  irbesartan (AVAPRO) 300 MG tablet Take 300 mg by mouth daily.    [provider]  Lidocaine-Menthol (LIMENCIN) 4-4 % PTCH Apply 1 patch topically See admin instructions. Apply 1 patch daily to the right shoulder/remove each evening    [provider]  loratadine (CLARITIN) 10 MG tablet Take 10 mg by mouth daily.    [provider]  metFORMIN (GLUCOPHAGE) 500 MG tablet Take 1 tablet (500 mg total) by mouth 2 (two) times daily with a meal. 01/17/18   Renato Shin, MD  metoprolol (LOPRESSOR) 50 MG tablet Take 75 mg by mouth 2 (two) times daily.    [provider]  montelukast (SINGULAIR) 10 MG tablet TAKE 1 TABLET(10 MG) BY MOUTH DAILY 04/03/16   Magdalen Spatz, NP  Multiple Vitamins-Minerals (MULTIVITAMIN WITH MINERALS) tablet Take 1 tablet by mouth daily.    [provider]  nystatin cream (MYCOSTATIN) Apply 1 application topically 2 (two) times daily as needed for dry skin (skin irritation).    [provider]  ONE TOUCH LANCETS MISC Use to check sugars twice daily DX E11.22 08/23/17   Renato Shin, MD  oxybutynin (DITROPAN-XL) 10 MG 24 hr tablet Take 1 tablet (10 mg total)  by mouth daily. 02/18/15   Biagio Borg, MD  pantoprazole (PROTONIX) 40 MG tablet TAKE 1 TABLET(40 MG) BY MOUTH TWICE DAILY 03/02/17   Collene Gobble, MD  predniSONE (DELTASONE) 10 MG tablet Take 2 tablets (20 mg total) by mouth daily with breakfast. 09/03/17   Aline August, MD  predniSONE (DELTASONE) 20 MG tablet TAKE 1 TABLET(20 MG) BY MOUTH DAILY WITH BREAKFAST 02/02/18   Collene Gobble, MD  ranitidine (ZANTAC) 300 MG tablet Take 1 tablet (300 mg total) by mouth 2 (two) times daily. 11/22/17   Debbe Odea, MD  sodium chloride (OCEAN) 0.65 % SOLN nasal spray Place 2 sprays into both nostrils 2 (two) times daily.    [provider]  traMADol (ULTRAM) 50 MG tablet Take 50 mg by mouth every 12 (twelve) hours as needed for moderate pain.    [provider]  TURMERIC PO Take 1 tablet by mouth daily.    [provider]    Family History Family History  Problem Relation Age of Onset  . Esophageal cancer Brother   . Esophageal cancer Sister        ?  . Colon polyps Brother   . Pancreatic cancer Sister        ?  . Diabetes Sister   . Diabetes Unknown        Aunt and Uncle  . Heart disease Maternal Grandfather     Social History Social History   Tobacco Use  . Smoking status: Former Smoker    Packs/day: 1.00    Years: 20.00    Pack years: 20.00    Types: Cigarettes    Last attempt to quit: 06/22/1986    Years since quitting: 31.6  . Smokeless tobacco: Never Used  Substance Use Topics  . Alcohol use: Yes  . Drug use: No     Allergies   Ace inhibitors; Penicillins; and Adhesive [tape]   Review of Systems Review of Systems  Gastrointestinal: Positive for nausea.  Neurological: Positive for dizziness.  All other systems reviewed and are negative.    Physical Exam Updated Vital Signs BP (!) 190/106   Pulse 67   Temp 98.2 F (36.8 C) (Oral)   Resp 16   Ht 5\' 4"  (1.626 m)  Wt (!) 142.4 kg   SpO2 98%   BMI 53.90 kg/m   Physical Exam    Constitutional: She is oriented to person, place, and time. She appears well-developed and well-nourished. No distress.  HENT:  Head: Normocephalic and atraumatic.  Right Ear: Hearing normal.  Left Ear: Hearing normal.  Nose: Nose normal.  Mouth/Throat: Oropharynx is clear and moist and mucous membranes are normal.  Eyes: Pupils are equal, round, and reactive to light. Conjunctivae and EOM are normal.  Neck: Normal range of motion. Neck supple.  Cardiovascular: Regular rhythm, S1 normal and S2 normal. Exam reveals no gallop and no friction rub.  No murmur heard. Pulmonary/Chest: Effort normal and breath sounds normal. No respiratory distress. She exhibits no tenderness.  Abdominal: Soft. Normal appearance and bowel sounds are normal. There is no hepatosplenomegaly. There is no tenderness. There is no rebound, no guarding, no tenderness at McBurney's point and negative Murphy's sign. No hernia.  Musculoskeletal: Normal range of motion.  Neurological: She is alert and oriented to person, place, and time. She has normal strength. No cranial nerve deficit or sensory deficit. Coordination normal. GCS eye subscore is 4. GCS verbal subscore is 5. GCS motor subscore is 6.  Skin: Skin is warm, dry and intact. No rash noted. No cyanosis.  Psychiatric: She has a normal mood and affect. Her speech is normal and behavior is normal. Thought content normal.  Nursing May and vitals reviewed.    ED Treatments / Results  Labs (all labs ordered are listed, but only abnormal results are displayed) Labs Reviewed  CBC WITH DIFFERENTIAL/PLATELET - Abnormal; Notable for the following components:      Result Value   WBC 11.0 (*)    Neutro Abs 8.3 (*)    All other components within normal limits  BASIC METABOLIC PANEL - Abnormal; Notable for the following components:   Potassium 5.2 (*)    Glucose, Bld 302 (*)    Creatinine, Ser 1.05 (*)    GFR calc non Af Amer 53 (*)    All other components within  normal limits  URINALYSIS, ROUTINE W REFLEX MICROSCOPIC - Abnormal; Notable for the following components:   Glucose, UA >=500 (*)    All other components within normal limits  CBG MONITORING, ED    EKG None  Radiology Ct Head Wo Contrast  Result Date: 02/06/2018 CLINICAL DATA:  Dizziness. EXAM: CT HEAD WITHOUT CONTRAST TECHNIQUE: Contiguous axial images were obtained from the base of the skull through the vertex without intravenous contrast. COMPARISON:  11/17/2017 FINDINGS: Brain: No evidence of acute infarction, hemorrhage, hydrocephalus, extra-axial collection or mass lesion/mass effect. Mild cerebral atrophy. Vascular: Moderate intracranial arterial vascular calcifications are present. Skull: Calvarium appears intact. Sinuses/Orbits: Paranasal sinuses and mastoid air cells are clear. Other: None. IMPRESSION: No acute intracranial abnormality. Electronically Signed   By: Lucienne Capers M.D.   On: 02/06/2018 01:34    Procedures Procedures (including critical care time)  Medications Ordered in ED Medications  sodium chloride 0.9 % bolus 500 mL (0 mLs Intravenous Stopped 02/06/18 0447)  ondansetron (ZOFRAN) injection 4 mg (4 mg Intravenous Given 02/06/18 0049)  LORazepam (ATIVAN) injection 0.5 mg (0.5 mg Intravenous Given 02/06/18 0049)  meclizine (ANTIVERT) tablet 12.5 mg (12.5 mg Oral Given 02/06/18 0055)     Initial Impression / Assessment and Plan / ED Course  I have reviewed the triage vital signs and the nursing notes.  Pertinent labs & imaging results that were available during my care of the patient  were reviewed by me and considered in my medical decision making (see chart for details).     She presents to the emergency department for evaluation of dizziness.  Patient had sudden onset of vertigo symptoms after she was rolled to the right side in her bed.  She is generally bedbound, does not get out of bed and that she is being transported to a doctor's office.  At arrival to  the ER she is still feeling vertigo symptoms whenever she moves her head.  Presentation seems consistent with a benign positional vertigo, stroke is also considered.  Patient reports some improvement after meclizine and Ativan, IV fluids, but has not had complete resolution.  She does not have any other focal neurologic deficits.  Nothing to suggest obvious stroke.  Discussed with Dr. Lorraine Lax, on-call for neurology.  He recommends that she undergo MRI, MRA to further evaluate her dizziness.  If MRI and MRA are negative, she will be appropriate for discharge.  Will sign out to oncoming ER physician to follow-up the studies.  Patient's blood pressure has progressively increased.  Will administer her routine medications for blood pressure control.  Final Clinical Impressions(s) / ED Diagnoses   Final diagnoses:  Vertigo  Essential hypertension    ED Discharge Orders    None       Kareema Keitt, Gwenyth Allegra, MD 02/06/18 [id number]    Orpah Greek, MD 02/06/18 [id number]

## 2018-02-06 NOTE — ED Notes (Signed)
Patient blood sugar was low at 55. Patient was provided with sandwich  to eat and some orange use to drink. Will continue to monitor the sugar.

## 2018-02-06 NOTE — ED Notes (Signed)
Patient transported to MRI 

## 2018-02-06 NOTE — ED Notes (Signed)
EDP made aware of patient's trending blood pressure

## 2018-02-09 DIAGNOSIS — Z794 Long term (current) use of insulin: Secondary | ICD-10-CM | POA: Diagnosis not present

## 2018-02-09 DIAGNOSIS — Z87891 Personal history of nicotine dependence: Secondary | ICD-10-CM | POA: Diagnosis not present

## 2018-02-09 DIAGNOSIS — I10 Essential (primary) hypertension: Secondary | ICD-10-CM | POA: Diagnosis not present

## 2018-02-09 DIAGNOSIS — E1140 Type 2 diabetes mellitus with diabetic neuropathy, unspecified: Secondary | ICD-10-CM | POA: Diagnosis not present

## 2018-02-11 ENCOUNTER — Other Ambulatory Visit: Payer: Self-pay

## 2018-02-11 ENCOUNTER — Inpatient Hospital Stay: Admission: RE | Admit: 2018-02-11 | Payer: Self-pay | Source: Ambulatory Visit

## 2018-02-11 ENCOUNTER — Encounter (HOSPITAL_COMMUNITY): Payer: Self-pay

## 2018-02-11 ENCOUNTER — Inpatient Hospital Stay (HOSPITAL_COMMUNITY)
Admission: EM | Admit: 2018-02-11 | Discharge: 2018-02-22 | DRG: 516 | Disposition: A | Discharge status: Skilled Nursing Facility | Payer: Medicare Other | Attending: Internal Medicine | Admitting: Internal Medicine

## 2018-02-11 ENCOUNTER — Emergency Department (HOSPITAL_COMMUNITY): Payer: Medicare Other

## 2018-02-11 DIAGNOSIS — M4804 Spinal stenosis, thoracic region: Principal | ICD-10-CM | POA: Diagnosis present

## 2018-02-11 DIAGNOSIS — N183 Chronic kidney disease, stage 3 (moderate): Secondary | ICD-10-CM | POA: Diagnosis present

## 2018-02-11 DIAGNOSIS — Z96653 Presence of artificial knee joint, bilateral: Secondary | ICD-10-CM | POA: Diagnosis present

## 2018-02-11 DIAGNOSIS — I959 Hypotension, unspecified: Secondary | ICD-10-CM | POA: Diagnosis not present

## 2018-02-11 DIAGNOSIS — Z90710 Acquired absence of both cervix and uterus: Secondary | ICD-10-CM

## 2018-02-11 DIAGNOSIS — G4733 Obstructive sleep apnea (adult) (pediatric): Secondary | ICD-10-CM | POA: Diagnosis present

## 2018-02-11 DIAGNOSIS — Z6841 Body Mass Index (BMI) 40.0 and over, adult: Secondary | ICD-10-CM | POA: Diagnosis not present

## 2018-02-11 DIAGNOSIS — M109 Gout, unspecified: Secondary | ICD-10-CM | POA: Diagnosis present

## 2018-02-11 DIAGNOSIS — R0902 Hypoxemia: Secondary | ICD-10-CM | POA: Diagnosis not present

## 2018-02-11 DIAGNOSIS — Z993 Dependence on wheelchair: Secondary | ICD-10-CM

## 2018-02-11 DIAGNOSIS — I9589 Other hypotension: Secondary | ICD-10-CM | POA: Diagnosis not present

## 2018-02-11 DIAGNOSIS — L89152 Pressure ulcer of sacral region, stage 2: Secondary | ICD-10-CM | POA: Diagnosis present

## 2018-02-11 DIAGNOSIS — M1A9XX0 Chronic gout, unspecified, without tophus (tophi): Secondary | ICD-10-CM | POA: Diagnosis not present

## 2018-02-11 DIAGNOSIS — E1142 Type 2 diabetes mellitus with diabetic polyneuropathy: Secondary | ICD-10-CM | POA: Diagnosis present

## 2018-02-11 DIAGNOSIS — F419 Anxiety disorder, unspecified: Secondary | ICD-10-CM | POA: Diagnosis present

## 2018-02-11 DIAGNOSIS — E1122 Type 2 diabetes mellitus with diabetic chronic kidney disease: Secondary | ICD-10-CM | POA: Diagnosis present

## 2018-02-11 DIAGNOSIS — G8929 Other chronic pain: Secondary | ICD-10-CM | POA: Diagnosis present

## 2018-02-11 DIAGNOSIS — G8220 Paraplegia, unspecified: Secondary | ICD-10-CM | POA: Diagnosis not present

## 2018-02-11 DIAGNOSIS — E1121 Type 2 diabetes mellitus with diabetic nephropathy: Secondary | ICD-10-CM | POA: Diagnosis present

## 2018-02-11 DIAGNOSIS — Z87891 Personal history of nicotine dependence: Secondary | ICD-10-CM

## 2018-02-11 DIAGNOSIS — Z794 Long term (current) use of insulin: Secondary | ICD-10-CM

## 2018-02-11 DIAGNOSIS — J449 Chronic obstructive pulmonary disease, unspecified: Secondary | ICD-10-CM | POA: Diagnosis present

## 2018-02-11 DIAGNOSIS — Z419 Encounter for procedure for purposes other than remedying health state, unspecified: Secondary | ICD-10-CM

## 2018-02-11 DIAGNOSIS — R000 Tachycardia, unspecified: Secondary | ICD-10-CM | POA: Diagnosis not present

## 2018-02-11 DIAGNOSIS — K219 Gastro-esophageal reflux disease without esophagitis: Secondary | ICD-10-CM | POA: Diagnosis present

## 2018-02-11 DIAGNOSIS — Z79891 Long term (current) use of opiate analgesic: Secondary | ICD-10-CM

## 2018-02-11 DIAGNOSIS — Z79899 Other long term (current) drug therapy: Secondary | ICD-10-CM

## 2018-02-11 DIAGNOSIS — L89301 Pressure ulcer of unspecified buttock, stage 1: Secondary | ICD-10-CM | POA: Diagnosis not present

## 2018-02-11 DIAGNOSIS — Z7982 Long term (current) use of aspirin: Secondary | ICD-10-CM

## 2018-02-11 DIAGNOSIS — Z8249 Family history of ischemic heart disease and other diseases of the circulatory system: Secondary | ICD-10-CM

## 2018-02-11 DIAGNOSIS — D649 Anemia, unspecified: Secondary | ICD-10-CM | POA: Diagnosis present

## 2018-02-11 DIAGNOSIS — D62 Acute posthemorrhagic anemia: Secondary | ICD-10-CM | POA: Diagnosis not present

## 2018-02-11 DIAGNOSIS — I10 Essential (primary) hypertension: Secondary | ICD-10-CM | POA: Diagnosis present

## 2018-02-11 DIAGNOSIS — E1165 Type 2 diabetes mellitus with hyperglycemia: Secondary | ICD-10-CM | POA: Diagnosis not present

## 2018-02-11 DIAGNOSIS — R55 Syncope and collapse: Secondary | ICD-10-CM | POA: Diagnosis not present

## 2018-02-11 DIAGNOSIS — R42 Dizziness and giddiness: Secondary | ICD-10-CM | POA: Diagnosis not present

## 2018-02-11 DIAGNOSIS — Z9049 Acquired absence of other specified parts of digestive tract: Secondary | ICD-10-CM

## 2018-02-11 DIAGNOSIS — Z981 Arthrodesis status: Secondary | ICD-10-CM

## 2018-02-11 DIAGNOSIS — M7138 Other bursal cyst, other site: Secondary | ICD-10-CM | POA: Diagnosis present

## 2018-02-11 DIAGNOSIS — Z888 Allergy status to other drugs, medicaments and biological substances status: Secondary | ICD-10-CM

## 2018-02-11 DIAGNOSIS — E1140 Type 2 diabetes mellitus with diabetic neuropathy, unspecified: Secondary | ICD-10-CM | POA: Diagnosis present

## 2018-02-11 DIAGNOSIS — R5381 Other malaise: Secondary | ICD-10-CM | POA: Diagnosis present

## 2018-02-11 DIAGNOSIS — Z880 Allergy status to penicillin: Secondary | ICD-10-CM

## 2018-02-11 DIAGNOSIS — Z7952 Long term (current) use of systemic steroids: Secondary | ICD-10-CM

## 2018-02-11 DIAGNOSIS — J309 Allergic rhinitis, unspecified: Secondary | ICD-10-CM | POA: Diagnosis present

## 2018-02-11 DIAGNOSIS — M549 Dorsalgia, unspecified: Secondary | ICD-10-CM | POA: Diagnosis present

## 2018-02-11 DIAGNOSIS — E860 Dehydration: Secondary | ICD-10-CM | POA: Diagnosis not present

## 2018-02-11 DIAGNOSIS — M4714 Other spondylosis with myelopathy, thoracic region: Secondary | ICD-10-CM | POA: Diagnosis not present

## 2018-02-11 DIAGNOSIS — L89311 Pressure ulcer of right buttock, stage 1: Secondary | ICD-10-CM | POA: Diagnosis not present

## 2018-02-11 DIAGNOSIS — Z22322 Carrier or suspected carrier of Methicillin resistant Staphylococcus aureus: Secondary | ICD-10-CM

## 2018-02-11 DIAGNOSIS — Z91048 Other nonmedicinal substance allergy status: Secondary | ICD-10-CM

## 2018-02-11 DIAGNOSIS — N179 Acute kidney failure, unspecified: Secondary | ICD-10-CM | POA: Diagnosis not present

## 2018-02-11 DIAGNOSIS — L89321 Pressure ulcer of left buttock, stage 1: Secondary | ICD-10-CM | POA: Diagnosis not present

## 2018-02-11 DIAGNOSIS — Z8614 Personal history of Methicillin resistant Staphylococcus aureus infection: Secondary | ICD-10-CM

## 2018-02-11 DIAGNOSIS — G4730 Sleep apnea, unspecified: Secondary | ICD-10-CM | POA: Diagnosis present

## 2018-02-11 DIAGNOSIS — Z833 Family history of diabetes mellitus: Secondary | ICD-10-CM

## 2018-02-11 DIAGNOSIS — Z7401 Bed confinement status: Secondary | ICD-10-CM

## 2018-02-11 DIAGNOSIS — R531 Weakness: Secondary | ICD-10-CM

## 2018-02-11 DIAGNOSIS — Z7951 Long term (current) use of inhaled steroids: Secondary | ICD-10-CM

## 2018-02-11 DIAGNOSIS — I129 Hypertensive chronic kidney disease with stage 1 through stage 4 chronic kidney disease, or unspecified chronic kidney disease: Secondary | ICD-10-CM | POA: Diagnosis present

## 2018-02-11 DIAGNOSIS — N3941 Urge incontinence: Secondary | ICD-10-CM | POA: Diagnosis present

## 2018-02-11 LAB — CBC WITH DIFFERENTIAL/PLATELET
Basophils Absolute: 0 10*3/uL (ref 0.0–0.1)
Basophils Relative: 0 %
Eosinophils Absolute: 0 10*3/uL (ref 0.0–0.7)
Eosinophils Relative: 0 %
HCT: 38.3 % (ref 36.0–46.0)
Hemoglobin: 12 g/dL (ref 12.0–15.0)
Lymphocytes Relative: 13 %
Lymphs Abs: 1.4 10*3/uL (ref 0.7–4.0)
MCH: 28.2 pg (ref 26.0–34.0)
MCHC: 31.3 g/dL (ref 30.0–36.0)
MCV: 89.9 fL (ref 78.0–100.0)
Monocytes Absolute: 0.5 10*3/uL (ref 0.1–1.0)
Monocytes Relative: 5 %
Neutro Abs: 8.5 10*3/uL — ABNORMAL HIGH (ref 1.7–7.7)
Neutrophils Relative %: 82 %
Platelets: 279 10*3/uL (ref 150–400)
RBC: 4.26 MIL/uL (ref 3.87–5.11)
RDW: 14.7 % (ref 11.5–15.5)
WBC: 10.4 10*3/uL (ref 4.0–10.5)

## 2018-02-11 LAB — URINALYSIS, ROUTINE W REFLEX MICROSCOPIC
Bilirubin Urine: NEGATIVE
Glucose, UA: 150 mg/dL — AB
Hgb urine dipstick: NEGATIVE
Ketones, ur: 5 mg/dL — AB
Nitrite: NEGATIVE
Protein, ur: NEGATIVE mg/dL
Specific Gravity, Urine: 1.013 (ref 1.005–1.030)
pH: 5 (ref 5.0–8.0)

## 2018-02-11 LAB — COMPREHENSIVE METABOLIC PANEL
ALT: 15 U/L (ref 0–44)
AST: 13 U/L — ABNORMAL LOW (ref 15–41)
Albumin: 3 g/dL — ABNORMAL LOW (ref 3.5–5.0)
Alkaline Phosphatase: 62 U/L (ref 38–126)
Anion gap: 13 (ref 5–15)
BUN: 45 mg/dL — ABNORMAL HIGH (ref 8–23)
CO2: 26 mmol/L (ref 22–32)
Calcium: 9.5 mg/dL (ref 8.9–10.3)
Chloride: 100 mmol/L (ref 98–111)
Creatinine, Ser: 1.76 mg/dL — ABNORMAL HIGH (ref 0.44–1.00)
GFR calc Af Amer: 33 mL/min — ABNORMAL LOW (ref >60)
GFR calc non Af Amer: 29 mL/min — ABNORMAL LOW (ref >60)
Glucose, Bld: 268 mg/dL — ABNORMAL HIGH (ref 70–99)
Potassium: 5 mmol/L (ref 3.5–5.1)
Sodium: 139 mmol/L (ref 135–145)
Total Bilirubin: 0.6 mg/dL (ref 0.3–1.2)
Total Protein: 7 g/dL (ref 6.5–8.1)

## 2018-02-11 LAB — I-STAT CG4 LACTIC ACID, ED: Lactic Acid, Venous: 1.41 mmol/L (ref 0.5–1.9)

## 2018-02-11 LAB — TROPONIN I

## 2018-02-11 LAB — I-STAT TROPONIN, ED: Troponin i, poc: 0 ng/mL (ref 0.00–0.08)

## 2018-02-11 LAB — GLUCOSE, CAPILLARY: GLUCOSE-CAPILLARY: 257 mg/dL — AB (ref 70–99)

## 2018-02-11 MED ORDER — ONDANSETRON HCL 4 MG PO TABS
4.0000 mg | ORAL_TABLET | Freq: Four times a day (QID) | ORAL | Status: DC | PRN
  Administered 2018-02-11: 4 mg via ORAL
  Filled 2018-02-11 (×2): qty 1

## 2018-02-11 MED ORDER — HYDROCODONE-ACETAMINOPHEN 5-325 MG PO TABS: 1.0000 | ORAL_TABLET | Freq: Four times a day (QID) | ORAL | Status: DC | PRN

## 2018-02-11 MED ORDER — SODIUM CHLORIDE 0.9 % IV SOLN
INTRAVENOUS | Status: AC
  Administered 2018-02-12: via INTRAVENOUS

## 2018-02-11 MED ORDER — HYDROCODONE-ACETAMINOPHEN 5-325 MG PO TABS
1.0000 | ORAL_TABLET | ORAL | Status: DC | PRN
  Administered 2018-02-11: 1 via ORAL
  Administered 2018-02-12: 2 via ORAL
  Administered 2018-02-12 (×2): 1 via ORAL
  Administered 2018-02-13 – 2018-02-16 (×10): 2 via ORAL
  Filled 2018-02-11 (×11): qty 2
  Filled 2018-02-11: qty 1
  Filled 2018-02-11 (×2): qty 2

## 2018-02-11 MED ORDER — LORATADINE 10 MG PO TABS
10.0000 mg | ORAL_TABLET | Freq: Every day | ORAL | Status: DC
  Administered 2018-02-11 – 2018-02-22 (×12): 10 mg via ORAL
  Filled 2018-02-11 (×12): qty 1

## 2018-02-11 MED ORDER — ONDANSETRON HCL 4 MG/2ML IJ SOLN: 4.0000 mg | Freq: Four times a day (QID) | INTRAMUSCULAR | Status: DC | PRN

## 2018-02-11 MED ORDER — ACETAMINOPHEN 650 MG RE SUPP: 650.0000 mg | Freq: Four times a day (QID) | RECTAL | Status: DC | PRN

## 2018-02-11 MED ORDER — PANTOPRAZOLE SODIUM 40 MG PO TBEC
40.0000 mg | DELAYED_RELEASE_TABLET | Freq: Two times a day (BID) | ORAL | Status: DC
  Administered 2018-02-11 – 2018-02-22 (×23): 40 mg via ORAL
  Filled 2018-02-11 (×23): qty 1

## 2018-02-11 MED ORDER — INSULIN ASPART 100 UNIT/ML ~~LOC~~ SOLN
0.0000 [IU] | Freq: Three times a day (TID) | SUBCUTANEOUS | Status: DC
  Administered 2018-02-12: 9 [IU] via SUBCUTANEOUS
  Administered 2018-02-12: 3 [IU] via SUBCUTANEOUS
  Administered 2018-02-12 – 2018-02-13 (×2): 9 [IU] via SUBCUTANEOUS
  Administered 2018-02-13 (×2): 5 [IU] via SUBCUTANEOUS
  Administered 2018-02-14: 7 [IU] via SUBCUTANEOUS
  Administered 2018-02-14: 3 [IU] via SUBCUTANEOUS
  Administered 2018-02-15: 5 [IU] via SUBCUTANEOUS

## 2018-02-11 MED ORDER — PREDNISONE 20 MG PO TABS
20.0000 mg | ORAL_TABLET | Freq: Every day | ORAL | Status: DC
  Administered 2018-02-12 – 2018-02-16 (×5): 20 mg via ORAL
  Filled 2018-02-11 (×5): qty 1

## 2018-02-11 MED ORDER — OXYBUTYNIN CHLORIDE ER 10 MG PO TB24
10.0000 mg | ORAL_TABLET | Freq: Every day | ORAL | Status: DC
  Administered 2018-02-11 – 2018-02-22 (×12): 10 mg via ORAL
  Filled 2018-02-11 (×2): qty 1
  Filled 2018-02-11 (×3): qty 2
  Filled 2018-02-11: qty 1
  Filled 2018-02-11 (×2): qty 2
  Filled 2018-02-11: qty 1
  Filled 2018-02-11: qty 2
  Filled 2018-02-11 (×2): qty 1

## 2018-02-11 MED ORDER — ALBUTEROL SULFATE (2.5 MG/3ML) 0.083% IN NEBU: 2.5000 mg | INHALATION_SOLUTION | RESPIRATORY_TRACT | Status: DC | PRN

## 2018-02-11 MED ORDER — ASPIRIN EC 81 MG PO TBEC
81.0000 mg | DELAYED_RELEASE_TABLET | Freq: Every day | ORAL | Status: DC
  Administered 2018-02-11 – 2018-02-15 (×5): 81 mg via ORAL
  Filled 2018-02-11 (×5): qty 1

## 2018-02-11 MED ORDER — SODIUM CHLORIDE 0.9 % IV BOLUS
1000.0000 mL | Freq: Once | INTRAVENOUS | Status: DC
  Administered 2018-02-11: 1000 mL via INTRAVENOUS

## 2018-02-11 MED ORDER — CYCLOBENZAPRINE HCL 10 MG PO TABS
10.0000 mg | ORAL_TABLET | Freq: Every day | ORAL | Status: DC
  Administered 2018-02-11 – 2018-02-22 (×12): 10 mg via ORAL
  Filled 2018-02-11 (×12): qty 1

## 2018-02-11 MED ORDER — INSULIN ASPART 100 UNIT/ML ~~LOC~~ SOLN
0.0000 [IU] | Freq: Every day | SUBCUTANEOUS | Status: DC
  Administered 2018-02-11 – 2018-02-13 (×3): 3 [IU] via SUBCUTANEOUS
  Administered 2018-02-14: 4 [IU] via SUBCUTANEOUS
  Administered 2018-02-15: 3 [IU] via SUBCUTANEOUS
  Administered 2018-02-16: 4 [IU] via SUBCUTANEOUS
  Administered 2018-02-18 – 2018-02-22 (×4): 3 [IU] via SUBCUTANEOUS

## 2018-02-11 MED ORDER — GABAPENTIN 800 MG PO TABS
800.0000 mg | ORAL_TABLET | Freq: Three times a day (TID) | ORAL | Status: DC
  Filled 2018-02-11: qty 1

## 2018-02-11 MED ORDER — GABAPENTIN 400 MG PO CAPS
800.0000 mg | ORAL_CAPSULE | Freq: Three times a day (TID) | ORAL | Status: DC
  Administered 2018-02-11 – 2018-02-22 (×33): 800 mg via ORAL
  Filled 2018-02-11 (×34): qty 2

## 2018-02-11 MED ORDER — CYCLOSPORINE 0.05 % OP EMUL
1.0000 [drp] | Freq: Two times a day (BID) | OPHTHALMIC | Status: DC
  Administered 2018-02-11 – 2018-02-22 (×23): 1 [drp] via OPHTHALMIC
  Filled 2018-02-11 (×24): qty 1

## 2018-02-11 MED ORDER — MONTELUKAST SODIUM 10 MG PO TABS
10.0000 mg | ORAL_TABLET | Freq: Every day | ORAL | Status: DC
  Administered 2018-02-11 – 2018-02-22 (×12): 10 mg via ORAL
  Filled 2018-02-11 (×12): qty 1

## 2018-02-11 MED ORDER — ACETAMINOPHEN 325 MG PO TABS: 650.0000 mg | ORAL_TABLET | Freq: Four times a day (QID) | ORAL | Status: DC | PRN

## 2018-02-11 MED ORDER — FOLIC ACID 1 MG PO TABS
1.0000 mg | ORAL_TABLET | Freq: Every day | ORAL | Status: DC
  Administered 2018-02-12 – 2018-02-22 (×11): 1 mg via ORAL
  Filled 2018-02-11 (×11): qty 1

## 2018-02-11 MED ORDER — ENOXAPARIN SODIUM 80 MG/0.8ML ~~LOC~~ SOLN
0.5000 mg/kg | SUBCUTANEOUS | Status: DC
  Administered 2018-02-11 – 2018-02-15 (×5): 70 mg via SUBCUTANEOUS
  Filled 2018-02-11 (×5): qty 0.8

## 2018-02-11 MED ORDER — TRAMADOL HCL 50 MG PO TABS: 50.0000 mg | ORAL_TABLET | Freq: Two times a day (BID) | ORAL | Status: DC | PRN

## 2018-02-11 MED ORDER — SODIUM CHLORIDE 0.9 % IV BOLUS
1000.0000 mL | Freq: Once | INTRAVENOUS | Status: AC
  Administered 2018-02-11: 1000 mL via INTRAVENOUS

## 2018-02-11 MED ORDER — ALLOPURINOL 300 MG PO TABS
300.0000 mg | ORAL_TABLET | Freq: Every day | ORAL | Status: DC
  Administered 2018-02-12 – 2018-02-22 (×11): 300 mg via ORAL
  Filled 2018-02-11 (×11): qty 1

## 2018-02-11 MED ORDER — INSULIN NPH (HUMAN) (ISOPHANE) 100 UNIT/ML ~~LOC~~ SUSP
120.0000 [IU] | Freq: Every day | SUBCUTANEOUS | Status: DC
  Administered 2018-02-12 – 2018-02-15 (×4): 120 [IU] via SUBCUTANEOUS
  Filled 2018-02-11: qty 10

## 2018-02-11 MED ORDER — ONDANSETRON HCL 4 MG/2ML IJ SOLN
4.0000 mg | Freq: Once | INTRAMUSCULAR | Status: AC
  Administered 2018-02-11: 4 mg via INTRAVENOUS
  Filled 2018-02-11: qty 2

## 2018-02-11 NOTE — ED Provider Notes (Signed)
[facility] DEPT Provider Note   CSN: [id number] Arrival date & time: 02/11/18  1436     History   Chief Complaint Chief Complaint  Patient presents with  . Hypotension  . Skin Ulcer    HPI Christy May is a 67 y.o. female with history of diabetes, COPD, chronic neck and back pain who was essentially bedbound who presents with decubitus ulcers and hypotension.  Patient reports her home health nurse came today and was concerned about her decubitus ulcers and sent her here for evaluation.  The decubitus ulcers have become a little more painful.  In route, patient became hypotensive and had a staring episode.  EMS reports she had an episode where she was staring off into space and was unresponsive to voice.  At that time her blood pressure was 88/52.  Patient was recently evaluated for vertigo and had negative MRI/ MRA of the head and neck.  Patient was discharged home with meclizine and Zofran.  Patient denies any chest pain, shortness of breath, abdominal pain, vomiting.  She does have continued vertigo only when she moves her head and is associated nausea.  She took meclizine today, but not Zofran.  Patient is found to be requiring oxygen at 2 L.  She reports she uses oxygen from a regulator at home as needed, but is not very often.  Patient is concerned because she missed a scheduled MRI of her cervical and thoracic spine today ordered by Dr. Arnoldo Morale.  HPI  Past Medical History:  Diagnosis Date  . Allergy   . Anemia, unspecified   . Anxiety   . Arthritis    "neck, back, hands" (09/03/2014)  . Chronic airway obstruction, not elsewhere classified    "I was told I don't have this/tests done @ [facility] 05/2014"  . Chronic back pain   . Colon polyps   . Complication of anesthesia    "I've had recall; I probably stopped breathing at least 2 times" (09/03/2014)  . Dehydration   . Depressive disorder, not elsewhere classified   . Diabetic peripheral  neuropathy ([place])   . Dysphagia 2007   historyof dysphagia with severe dysmotility by barium swallow-[name]  . Esophageal reflux   . Family history of adverse reaction to anesthesia    "my brother was told they lost him a couple times during OR"  . Heart murmur   . History of hiatal hernia   . Migraine    "couple times/month right now" (09/03/2014)  . Obesity, unspecified   . OSA (obstructive sleep apnea)     CPAP  . Septic shock ([place]) 08/30/2017  . Spondylosis of unspecified site without mention of myelopathy   . Type II diabetes mellitus ([place])   . Unspecified essential hypertension   . Unspecified menopausal and postmenopausal disorder   . Unspecified vitamin D deficiency   . Upper airway cough syndrome     Patient Active Problem List   Diagnosis Date Noted  . Dehydration 02/11/2018  . AKI (acute kidney injury) ([place]) 02/11/2018  . Hypotension 02/11/2018  . Syncope, vasovagal 02/11/2018  . DM neuropathy, type II diabetes mellitus ([place]) 11/18/2017  . Weakness 11/18/2017  . SIRS (systemic inflammatory response syndrome) ([place]) 11/17/2017  . Diabetes mellitus type 2 in obese ([place]) 11/17/2017  . Acute lower UTI 11/17/2017  . Septic shock ([place]) 08/30/2017  . Acute renal failure (ARF) ([place]) 08/13/2017  . Pressure injury of skin 08/13/2017  . Sepsis due to gram-negative UTI ([place]) 08/12/2017  .  Upper airway cough syndrome 09/28/2016  . Acute bronchitis 10/29/2015  . Diabetes ([place]) 08/21/2015  . Chest pain 07/14/2015  . Dyspnea on exertion 07/05/2015  . Obstructive sleep apnea 07/05/2015  . Constipation 03/05/2015  . Allergic rhinitis 03/05/2015  . Hearing loss 03/05/2015  . Vocal cord dysfunction 03/05/2015  . Pain in joint, shoulder region 09/11/2014  . Weakness generalized 09/02/2014  . Acute esophagitis 05/08/2014  . Benign neoplasm of sigmoid colon 05/08/2014  . Change in bowel habits 04/17/2014  . Fecal incontinence 04/17/2014  . Urge incontinence 04/08/2014  .  Cough 12/06/2013  . Orthostasis 07/18/2013  . Acute kidney injury ([place]) 07/18/2013  . Bilateral carpal tunnel syndrome 02/14/2013  . Gout 01/04/2013  . Failure to thrive 06/28/2012  . Gait disorder 06/28/2012  . Lower abdominal pain 12/14/2011  . Preventative health care 08/23/2011  . Cervical radiculopathy 02/11/2011  . Low back pain 02/11/2011  . VITAMIN D DEFICIENCY 11/22/2009  . ANEMIA-NOS 11/22/2009  . DEGENERATIVE JOINT DISEASE, CERVICAL SPINE 11/22/2009  . Depression 09/16/2009  . POLYNEUROPATHY 09/16/2009  . Morbid obesity ([place]) 09/09/2009  . Essential hypertension 09/09/2009  . GERD 09/09/2009  . Sleep apnea 09/09/2009    Past Surgical History:  Procedure Laterality Date  . ABDOMINAL HYSTERECTOMY     "partial"  . ANTERIOR CERVICAL DECOMP/DISCECTOMY FUSION     multiple cervical spine levels;Dr. Arnoldo Morale  . APPENDECTOMY    . BACK SURGERY    . CHOLECYSTECTOMY OPEN    . COLONOSCOPY N/A 05/08/2014   Procedure: COLONOSCOPY;  Surgeon: [name], MD;  Location: [facility] ENDOSCOPY;  Service: Endoscopy;  Laterality: N/A;  . DILATION AND CURETTAGE OF UTERUS    . ESOPHAGOGASTRODUODENOSCOPY N/A 05/08/2014   Procedure: ESOPHAGOGASTRODUODENOSCOPY (EGD);  Surgeon: [name], MD;  Location: [name];  Service: Endoscopy;  Laterality: N/A;  . EXPLORATORY LAPAROTOMY    . EYE SURGERY    . FOOT SURGERY Right X 2   "took blood out of my arms and put platelets in my feet"  . INCISION AND DRAINAGE ABSCESS     Chest  . JOINT REPLACEMENT    . REFRACTIVE SURGERY Bilateral   . TONSILLECTOMY    . TOTAL KNEE ARTHROPLASTY Left 10/2003  . TOTAL KNEE ARTHROPLASTY Right 01/2004   [name]     OB History   None      Home Medications    Prior to Admission medications   Medication Sig Start Date End Date Taking? Authorizing Provider  allopurinol (ZYLOPRIM) 300 MG tablet Take 300 mg by mouth daily.   Yes [provider]  aspirin EC 81 MG tablet Take 81 mg by mouth at  bedtime.   Yes [provider]  Cholecalciferol (VITAMIN D-3) 1000 units CAPS Take 2,000 Units by mouth daily.   Yes [provider]  cyclobenzaprine (FLEXERIL) 10 MG tablet Take 10 mg by mouth daily. 12/06/17  Yes [provider]  cycloSPORINE (RESTASIS) 0.05 % ophthalmic emulsion Place 1 drop into both eyes 2 (two) times daily.    Yes [provider]  fluticasone (FLONASE) 50 MCG/ACT nasal spray SHAKE LIQUID AND USE 2 SPRAYS IN EACH NOSTRIL DAILY Patient taking differently: Place 2 sprays into both nostrils daily.  03/08/17  Yes Collene Gobble, MD  folic acid (FOLVITE) 1 MG tablet Take 1 mg by mouth once a day 09/03/17  Yes Aline August, MD  gabapentin (NEURONTIN) 800 MG tablet Take 800 mg by mouth 3 (three) times daily.   Yes [provider]  HYDROcodone-acetaminophen (NORCO/VICODIN) 5-325 MG tablet Take 1 tablet by mouth every 6 (six) hours as needed. 12/09/17  Yes Maczis, Barth Kirks, PA-C  insulin NPH Human (NOVOLIN N RELION) 100 UNIT/ML injection Inject 1.2 mLs (120 Units total) into the skin every morning. 03/09/17  Yes Renato Shin, MD  ipratropium (ATROVENT) 0.03 % nasal spray USE 2 SPRAYS IN EACH NOSTRIL THREE TIMES DAILY AS NEEDED FOR RHINITIS Patient taking differently: Place 2 sprays into both nostrils 2 (two) times daily.  03/17/17  Yes Collene Gobble, MD  irbesartan (AVAPRO) 300 MG tablet Take 300 mg by mouth daily.   Yes [provider]  loratadine (CLARITIN) 10 MG tablet Take 10 mg by mouth daily.   Yes [provider]  meclizine (ANTIVERT) 25 MG tablet Take 1 tablet (25 mg total) by mouth 3 (three) times daily as needed for dizziness. 02/06/18  Yes Dorie Rank, MD  metFORMIN (GLUCOPHAGE) 500 MG tablet Take 1 tablet (500 mg total) by mouth 2 (two) times daily with a meal. 01/17/18  Yes Renato Shin, MD  metoprolol (LOPRESSOR) 50 MG tablet Take 75 mg by mouth 2 (two) times daily.   Yes [provider]  montelukast  (SINGULAIR) 10 MG tablet TAKE 1 TABLET(10 MG) BY MOUTH DAILY Patient taking differently: Take 10 mg by mouth at bedtime.  04/03/16  Yes Magdalen Spatz, NP  Multiple Vitamins-Minerals (MULTIVITAMIN WITH MINERALS) tablet Take 1 tablet by mouth daily.   Yes [provider]  nystatin cream (MYCOSTATIN) Apply 1 application topically 2 (two) times daily as needed for dry skin (skin irritation).   Yes [provider]  ondansetron (ZOFRAN ODT) 8 MG disintegrating tablet Take 1 tablet (8 mg total) by mouth every 8 (eight) hours as needed for nausea or vomiting. 02/06/18  Yes Dorie Rank, MD  oxybutynin (DITROPAN-XL) 10 MG 24 hr tablet Take 1 tablet (10 mg total) by mouth daily. 02/18/15  Yes Biagio Borg, MD  pantoprazole (PROTONIX) 40 MG tablet TAKE 1 TABLET(40 MG) BY MOUTH TWICE DAILY Patient taking differently: Take 40 mg by mouth 2 (two) times daily.  03/02/17  Yes Collene Gobble, MD  predniSONE (DELTASONE) 10 MG tablet Take 2 tablets (20 mg total) by mouth daily with breakfast. 09/03/17  Yes Aline August, MD  ranitidine (ZANTAC) 300 MG tablet Take 1 tablet (300 mg total) by mouth 2 (two) times daily. 11/22/17  Yes Debbe Odea, MD  sodium chloride (OCEAN) 0.65 % SOLN nasal spray Place 2 sprays into both nostrils 2 (two) times daily.   Yes [provider]  Tetrahydrozoline HCl (VISINE OP) Apply to eye.   Yes [provider]  traMADol (ULTRAM) 50 MG tablet Take 50 mg by mouth every 12 (twelve) hours as needed for moderate pain.   Yes [provider]  TURMERIC PO Take 1 tablet by mouth daily.   Yes [provider]  Insulin Syringe-Needle U-100 (INSULIN SYRINGE 1CC/31GX5/16") 31G X 5/16" 1 ML MISC USE TO INJECT INSULIN TWICE DAILY 05/11/17   Renato Shin, MD  ONE [facility] LANCETS MISC Use to check sugars twice daily DX E11.22 08/23/17   Renato Shin, MD  predniSONE (DELTASONE) 20 MG tablet TAKE 1 TABLET(20 MG) BY MOUTH DAILY WITH BREAKFAST Patient not taking:  Reported on 02/11/2018 02/02/18   Collene Gobble, MD    Family History Family History  Problem Relation Age of Onset  . Esophageal cancer Brother   . Esophageal cancer Sister        ?  Marland Kitchen  Colon polyps Brother   . Pancreatic cancer Sister        ?  . Diabetes Sister   . Diabetes Unknown        Aunt and Uncle  . Heart disease Maternal Grandfather     Social History Social History   Tobacco Use  . Smoking status: Former Smoker    Packs/day: 1.00    Years: 20.00    Pack years: 20.00    Types: Cigarettes    Last attempt to quit: 06/22/1986    Years since quitting: 31.6  . Smokeless tobacco: Never Used  Substance Use Topics  . Alcohol use: Yes  . Drug use: No     Allergies   Ace inhibitors; Penicillins; and Adhesive [tape]   Review of Systems Review of Systems  Constitutional: Negative for chills and fever.  HENT: Negative for facial swelling and sore throat.   Respiratory: Negative for shortness of breath.   Cardiovascular: Negative for chest pain.  Gastrointestinal: Positive for nausea. Negative for abdominal pain and vomiting.  Genitourinary: Negative for dysuria.  Musculoskeletal: Negative for back pain.  Skin: Positive for wound. Negative for rash.  Neurological: Positive for dizziness, syncope (EMS reports syncopal episode where patient was staring and unresponsive to voice) and light-headedness. Negative for headaches.  Psychiatric/Behavioral: The patient is not nervous/anxious.      Physical Exam Updated Vital Signs BP 115/82   Pulse 79   Resp 16   Ht 5\' 4"  (1.626 m)   Wt (!) 142 kg   SpO2 97%   BMI 53.74 kg/m   Physical Exam  Constitutional: She appears well-developed and well-nourished. No distress.  Obese  HENT:  Head: Normocephalic and atraumatic.  Mouth/Throat: Oropharynx is clear and moist. No oropharyngeal exudate.  Eyes: Pupils are equal, round, and reactive to light. Conjunctivae are normal. Right eye exhibits no discharge. Left eye  exhibits no discharge. No scleral icterus.  Neck: Normal range of motion. Neck supple. No thyromegaly present.  Cardiovascular: Normal rate, regular rhythm, normal heart sounds and intact distal pulses. Exam reveals no gallop and no friction rub.  No murmur heard. Pulmonary/Chest: Effort normal and breath sounds normal. No stridor. No respiratory distress. She has no wheezes. She has no rales.  Abdominal: Soft. Bowel sounds are normal. She exhibits no distension. There is no tenderness. There is no rebound and no guarding.  Musculoskeletal: She exhibits no edema.  Lymphadenopathy:    She has no cervical adenopathy.  Neurological: She is alert. Coordination normal.  Skin: Skin is warm and dry. No rash noted. She is not diaphoretic. No pallor.  Several areas of stage I decubitus ulcers without tenderness surrounding erythema or drainage on bilateral buttocks  Psychiatric: She has a normal mood and affect.  Nursing note and vitals reviewed.    ED Treatments / Results  Labs (all labs ordered are listed, but only abnormal results are displayed) Labs Reviewed  CBC WITH DIFFERENTIAL/PLATELET - Abnormal; Notable for the following components:      Result Value   Neutro Abs 8.5 (*)    All other components within normal limits  COMPREHENSIVE METABOLIC PANEL - Abnormal; Notable for the following components:   Glucose, Bld 268 (*)    BUN 45 (*)    Creatinine, Ser 1.76 (*)    Albumin 3.0 (*)    AST 13 (*)    GFR calc non Af Amer 29 (*)    GFR calc Af Amer 33 (*)    All other components  within normal limits  URINALYSIS, ROUTINE W REFLEX MICROSCOPIC - Abnormal; Notable for the following components:   APPearance HAZY (*)    Glucose, UA 150 (*)    Ketones, ur 5 (*)    Leukocytes, UA TRACE (*)    Bacteria, UA FEW (*)    All other components within normal limits  SODIUM, URINE, RANDOM  CREATININE, URINE, RANDOM  TROPONIN I  TROPONIN I  TROPONIN I  I-STAT CG4 LACTIC ACID, ED  I-STAT  TROPONIN, ED    EKG EKG Interpretation  Date/Time:  Friday February 11 2018 14:59:22 EDT Ventricular Rate:  80 PR Interval:    QRS Duration: 96 QT Interval:  395 QTC Calculation: 456 R Axis:   32 Text Interpretation:  Sinus rhythm no change from previous Confirmed by Charlesetta Shanks [phone]) on 02/11/2018 4:25:05 PM   Radiology Dg Chest Portable 1 View  Result Date: 02/11/2018 CLINICAL DATA:  Hypertension. EXAM: PORTABLE CHEST 1 VIEW COMPARISON:  Radiograph December 14, 2017. FINDINGS: Stable cardiomediastinal silhouette. No pneumothorax or pleural effusion is noted. Both lungs are clear. The visualized skeletal structures are unremarkable. IMPRESSION: No acute cardiopulmonary abnormality seen. Electronically Signed   By: Marijo Conception, M.D.   On: 02/11/2018 16:35    Procedures Procedures (including critical care time)  Medications Ordered in ED Medications  sodium chloride 0.9 % bolus 1,000 mL (has no administration in time range)  sodium chloride 0.9 % bolus 1,000 mL (0 mLs Intravenous Stopped 02/11/18 1717)  ondansetron (ZOFRAN) injection 4 mg (4 mg Intravenous Given 02/11/18 1557)     Initial Impression / Assessment and Plan / ED Course  I have reviewed the triage vital signs and the nursing notes.  Pertinent labs & imaging results that were available during my care of the patient were reviewed by me and considered in my medical decision making (see chart for details).  Clinical Course as of Feb 12 1931  Fri Feb 11, 2018  1549 I spoke with Marzetta Board in [place] who will make the location aware of patient being at the hospital and [facility] will contact patient to reschedule.   [AL]    Clinical Course User Index [AL] Frederica Kuster, PA-C    Patient presenting with hypotension and reported syncopal episode.  Patient is found to have acute kidney injury with BUN 45, creatinine 1.76.  This is significantly elevated from 5 days ago.  Patient also found to have  well-appearing stage I decubitus ulcers.  No signs of infection.  Fluids initiated in the ED.  Will admit for rehydration and further work-up of AKI.  I spoke with Dr. Roel Cluck with [facility] who accepts patient for admission.  I appreciate her assistance with the patient.    Per case management, home health has discontinued their care and will not proceed because they feel patient needs a higher level of care.  They feel that patient does not have a safe living environment.  Case management reports that patient previously has been accepted at SNF, however she declined. Patient also evaluated by Dr. Johnney Killian who guided the patient's management and agrees with plan.  Final Clinical Impressions(s) / ED Diagnoses   Final diagnoses:  Dehydration  AKI (acute kidney injury) ([place])  Pressure injury of buttock, stage 1, unspecified laterality    ED Discharge Orders    None       Frederica Kuster, PA-C 02/11/18 1932    Charlesetta Shanks, MD 02/15/18 1227

## 2018-02-11 NOTE — ED Provider Notes (Signed)
Medical screening examination/treatment/procedure(s) were conducted as a shared visit with non-physician practitioner(s) and myself.  I personally evaluated the patient during the encounter.  EKG Interpretation  Date/Time:  Friday February 11 2018 14:59:22 EDT Ventricular Rate:  80 PR Interval:    QRS Duration: 96 QT Interval:  395 QTC Calculation: 456 R Axis:   32 Text Interpretation:  Sinus rhythm no change from previous Confirmed by Charlesetta Shanks [phone]) on 02/11/2018 4:25:05 PM  Patient has multiple medical comorbidities and is essentially bedbound.  She was sent to the emergency department for evaluation with worsening decubitus ulcer.  On route hypotension documented with temporary mental status change.  Patient reports that she has been very weak and felt unwell.  She is alert and interactive.  She is obese.  No respiratory distress at rest.  Heart regular.  Lungs grossly clear.  The sacrum has several decubiti without erythema or drainage.  She does have AKI.  Advanced over the past 5 days.  He would I do not appear acutely infected.  Patient will be admitted for rehydration and brief mental status change with hypotension.  I agree with plan of management.    Charlesetta Shanks, MD 02/15/18 1226

## 2018-02-11 NOTE — H&P (Signed)
[name]:[id number] DOB: 12/06/1950 DOA: 02/11/2018     PCP: Lucianne Lei, MD   Outpatient Specialists  NONE   Patient arrived to ER on 02/11/18 at 1436  Patient coming from:  home Lives alone,     Chief Complaint:  Chief Complaint  Patient presents with  . Hypotension  . Skin Ulcer    HPI: Christy May is a 67 y.o. female with medical history significant of diabetes mellitus type 2, essential hypertension, anemia, gout, on chronic steroid  OSA on CPAP, gout, hx of MRSA    Presented with questionable syncopal episode today associated with hypotension  Home Health have seen her and felt she was unsafe at home. Patient had increased pain due to her decubitus ulcers and apparently EMS was called.  While attempted to be moved by EMS to be evaluated patient had a a staring episode for few seconds with diminished responsiveness. Noted to be hypotensive down to 80's EMS started on IVF.  CBG was 254 patient improved to baseline. Patient her self unsure if she had true syncope but reports everything just got darker for a bit. Patient remembers this even and never truly lost consciousness Patient was found hospitalized repeatedly over the past few months last admission was in June 2019 patient had significant back pain and weakness in her legs MRI at that time showed lumbar spine stenosis. Skilled nursing facility was recommended to discharge with patient refused since insurance would require her to give up her Social Security checks.  Patient elected to go home to maximize home health.  He was discharged to follow-up with Dr. Arnoldo Morale with neurosurgery.  Fortunately when patient went home she was not able to get home health in a timely fashion because they could not provide services due to her level of care that she required being getting some help from neighbors and church members.  Finally in August she was able to have home health arranged. Apparently patient also have  had some difficulty obtaining home health because there was some records of patient "faking falls syncopal episodes and attempted to take the therapies down with her" which resulted in patient being discharged from services.  Patient at some point even had a bed at [facility] but she refused She has been seen in the emergency department on 18 August for episode of vertigo she was given meclizine Ativan and IV fluids. She was noted to be transiently hypotensive and was rehydrated MRI/A of the head was non acute. She was supposed to have an MRI done of her cervical and thoracic spine ordered by Dr. Arnoldo Morale but was unable to get it because she was in the emergency department   Regarding pertinent Chronic problems: History of diabetes on metformin and is insulin.  Patient is along term steroids for unknown condition   While in ER: Rehydrated and SBP improved to 113 on intensive IV fluid therapy Noted to be requiring 2 L per records patient has PRN oxygen at home but does not use it very regularly.  The following Work up has been ordered so far:  Orders Placed This Encounter  Procedures  . DG Chest Portable 1 View  . CBC with Differential  . Comprehensive metabolic panel  . Urinalysis, Routine w reflex microscopic  . Wound care  . Catherize if unable to void  . Consult to hospitalist  . I-stat troponin, ED  . ED EKG  . EKG 12-Lead      Following Medications were ordered  in ER: Medications  sodium chloride 0.9 % bolus 1,000 mL (has no administration in time range)  sodium chloride 0.9 % bolus 1,000 mL (0 mLs Intravenous Stopped 02/11/18 1717)  ondansetron (ZOFRAN) injection 4 mg (4 mg Intravenous Given 02/11/18 1557)    Significant initial  Findings: Abnormal Labs Reviewed  CBC WITH DIFFERENTIAL/PLATELET - Abnormal; Notable for the following components:      Result Value   Neutro Abs 8.5 (*)    All other components within normal limits  COMPREHENSIVE METABOLIC PANEL - Abnormal; Notable  for the following components:   Glucose, Bld 268 (*)    BUN 45 (*)    Creatinine, Ser 1.76 (*)    Albumin 3.0 (*)    AST 13 (*)    GFR calc non Af Amer 29 (*)    GFR calc Af Amer 33 (*)    All other components within normal limits     Na 139 K 5.0  Cr   Up from baseline see below Lab Results  Component Value Date   CREATININE 1.76 (H) 02/11/2018   CREATININE 1.05 (H) 02/06/2018   CREATININE 1.05 (H) 12/16/2017      WBC 10.4  HG/HCT  stable,      Component Value Date/Time   HGB 12.0 02/11/2018 1541   HCT 38.3 02/11/2018 1541       Troponin (Point of Care Test) Recent Labs    02/11/18 1559  TROPIPOC 0.00       BNP (last 3 results) No results for input(s): BNP in the last 8760 hours.  ProBNP (last 3 results) No results for input(s): PROBNP in the last 8760 hours.  Lactic Acid, Venous    Component Value Date/Time   LATICACIDVEN 1.41 02/11/2018 1601      UA   no evidence of UTI        CXR -  NON acute    ECG:  Personally reviewed by me showing: HR : 80 Rhythm: NSR    no evidence of ischemic changes QTC 456       ED Triage Vitals  Enc Vitals Group     BP 02/11/18 1451 (!) 83/53     Pulse Rate 02/11/18 1451 81     Resp 02/11/18 1451 18     Temp --      Temp src --      SpO2 02/11/18 1451 95 %     Weight 02/11/18 1455 (!) 313 lb 0.9 oz (142 kg)     Height 02/11/18 1455 5\' 4"  (1.626 m)     Head Circumference --      Peak Flow --      Pain Score --      Pain Loc --      Pain Edu? --      Excl. in [place]? --   TMAX(24)@       Latest  Blood pressure 113/78, pulse 80, resp. rate (!) 21, height 5\' 4"  (1.626 m), weight (!) 142 kg, SpO2 100 %.     Hospitalist was called for admission for AKI dehydration and possible syncope   Review of Systems:    Pertinent positives include: fatigue, weight loss, skin breakdown frequent falls back pain lightheadedness  dizziness,   Constitutional:  No weight loss, night sweats, Fevers,  chills HEENT:  No headaches, Difficulty swallowing,Tooth/dental problems,Sore throat,  No sneezing, itching, ear ache, nasal congestion, post nasal drip,  Cardio-vascular:  No chest pain, Orthopnea, PND, anasarca,palpitations.no Bilateral lower extremity swelling  GI:  No heartburn, indigestion, abdominal pain, nausea, vomiting, diarrhea, change in bowel habits, loss of appetite, melena, blood in stool, hematemesis Resp:  no shortness of breath at rest. No dyspnea on exertion, No excess mucus, no productive cough, No non-productive cough, No coughing up of blood.No change in color of mucus.No wheezing. Skin:  no rash or lesions. No jaundice GU:  no dysuria, change in color of urine, no urgency or frequency. No straining to urinate.  No flank pain.  Musculoskeletal:  No joint pain or no joint swelling. No decreased range of motion. No back pain.  Psych:  No change in mood or affect. No depression or anxiety. No memory loss.  Neuro: no localizing neurological complaints, no tingling, no weakness, no double vision, no gait abnormality, no slurred speech, no confusion  All systems reviewed and apart from [place] all are negative  Past Medical History:   Past Medical History:  Diagnosis Date  . Allergy   . Anemia, unspecified   . Anxiety   . Arthritis    "neck, back, hands" (09/03/2014)  . Chronic airway obstruction, not elsewhere classified    "I was told I don't have this/tests done @ [facility] 05/2014"  . Chronic back pain   . Colon polyps   . Complication of anesthesia    "I've had recall; I probably stopped breathing at least 2 times" (09/03/2014)  . Dehydration   . Depressive disorder, not elsewhere classified   . Diabetic peripheral neuropathy ([place])   . Dysphagia 2007   historyof dysphagia with severe dysmotility by barium swallow-[name]  . Esophageal reflux   . Family history of adverse reaction to anesthesia    "my brother was told they lost him a couple times during  OR"  . Heart murmur   . History of hiatal hernia   . Migraine    "couple times/month right now" (09/03/2014)  . Obesity, unspecified   . OSA (obstructive sleep apnea)     CPAP  . Septic shock ([place]) 08/30/2017  . Spondylosis of unspecified site without mention of myelopathy   . Type II diabetes mellitus ([place])   . Unspecified essential hypertension   . Unspecified menopausal and postmenopausal disorder   . Unspecified vitamin D deficiency   . Upper airway cough syndrome       Past Surgical History:  Procedure Laterality Date  . ABDOMINAL HYSTERECTOMY     "partial"  . ANTERIOR CERVICAL DECOMP/DISCECTOMY FUSION     multiple cervical spine levels;Dr. Arnoldo Morale  . APPENDECTOMY    . BACK SURGERY    . CHOLECYSTECTOMY OPEN    . COLONOSCOPY N/A 05/08/2014   Procedure: COLONOSCOPY;  Surgeon: [name], MD;  Location: [facility] ENDOSCOPY;  Service: Endoscopy;  Laterality: N/A;  . DILATION AND CURETTAGE OF UTERUS    . ESOPHAGOGASTRODUODENOSCOPY N/A 05/08/2014   Procedure: ESOPHAGOGASTRODUODENOSCOPY (EGD);  Surgeon: [name], MD;  Location: [name];  Service: Endoscopy;  Laterality: N/A;  . EXPLORATORY LAPAROTOMY    . EYE SURGERY    . FOOT SURGERY Right X 2   "took blood out of my arms and put platelets in my feet"  . INCISION AND DRAINAGE ABSCESS     Chest  . JOINT REPLACEMENT    . REFRACTIVE SURGERY Bilateral   . TONSILLECTOMY    . TOTAL KNEE ARTHROPLASTY Left 10/2003  . TOTAL KNEE ARTHROPLASTY Right 01/2004   [name]    Social History:  Ambulatory  bed bound     reports  that she quit smoking about 31 years ago. Her smoking use included cigarettes. She has a 20.00 pack-year smoking history. She has never used smokeless tobacco. She reports that she drinks alcohol. She reports that she does not use drugs.     Family History:   Family History  Problem Relation Age of Onset  . Esophageal cancer Brother   . Esophageal cancer Sister        ?  . Colon polyps Brother    . Pancreatic cancer Sister        ?  . Diabetes Sister   . Diabetes Unknown        Aunt and Uncle  . Heart disease Maternal Grandfather     Allergies: Allergies  Allergen Reactions  . Ace Inhibitors Anaphylaxis and Cough    Tolerates Irbesartan (home med)  . Penicillins Hives and Other (See Comments)    Tolerated ceftriaxone in 2019  Has patient had a PCN reaction causing immediate rash, facial/tongue/throat swelling, SOB or lightheadedness with hypotension: Yes Has patient had a PCN reaction causing severe rash involving mucus membranes or skin necrosis:  No Has patient had a PCN reaction that required hospitalization: No Has patient had a PCN reaction occurring within the last 10 years: No If all of the above answers are "NO", then may proceed with Cephalosporin use.  . Adhesive [Tape] Hives and Rash     Prior to Admission medications   Medication Sig Start Date End Date Taking? Authorizing Provider  allopurinol (ZYLOPRIM) 300 MG tablet Take 300 mg by mouth daily.   Yes [provider]  aspirin EC 81 MG tablet Take 81 mg by mouth at bedtime.   Yes [provider]  Cholecalciferol (VITAMIN D-3) 1000 units CAPS Take 2,000 Units by mouth daily.   Yes [provider]  cyclobenzaprine (FLEXERIL) 10 MG tablet Take 10 mg by mouth daily. 12/06/17  Yes [provider]  cycloSPORINE (RESTASIS) 0.05 % ophthalmic emulsion Place 1 drop into both eyes 2 (two) times daily.    Yes [provider]  fluticasone (FLONASE) 50 MCG/ACT nasal spray SHAKE LIQUID AND USE 2 SPRAYS IN EACH NOSTRIL DAILY Patient taking differently: Place 2 sprays into both nostrils daily.  03/08/17  Yes Collene Gobble, MD  folic acid (FOLVITE) 1 MG tablet Take 1 mg by mouth once a day 09/03/17  Yes Aline August, MD  gabapentin (NEURONTIN) 800 MG tablet Take 800 mg by mouth 3 (three) times daily.   Yes [provider]  HYDROcodone-acetaminophen (NORCO/VICODIN) 5-325 MG  tablet Take 1 tablet by mouth every 6 (six) hours as needed. 12/09/17  Yes Maczis, Barth Kirks, PA-C  insulin NPH Human (NOVOLIN N RELION) 100 UNIT/ML injection Inject 1.2 mLs (120 Units total) into the skin every morning. 03/09/17  Yes Renato Shin, MD  ipratropium (ATROVENT) 0.03 % nasal spray USE 2 SPRAYS IN EACH NOSTRIL THREE TIMES DAILY AS NEEDED FOR RHINITIS Patient taking differently: Place 2 sprays into both nostrils 2 (two) times daily.  03/17/17  Yes Collene Gobble, MD  irbesartan (AVAPRO) 300 MG tablet Take 300 mg by mouth daily.   Yes [provider]  loratadine (CLARITIN) 10 MG tablet Take 10 mg by mouth daily.   Yes [provider]  meclizine (ANTIVERT) 25 MG tablet Take 1 tablet (25 mg total) by mouth 3 (three) times daily as needed for dizziness. 02/06/18  Yes Dorie Rank, MD  metFORMIN (GLUCOPHAGE) 500 MG tablet Take 1 tablet (500 mg total)  by mouth 2 (two) times daily with a meal. 01/17/18  Yes Renato Shin, MD  metoprolol (LOPRESSOR) 50 MG tablet Take 75 mg by mouth 2 (two) times daily.   Yes [provider]  montelukast (SINGULAIR) 10 MG tablet TAKE 1 TABLET(10 MG) BY MOUTH DAILY Patient taking differently: Take 10 mg by mouth at bedtime.  04/03/16  Yes Magdalen Spatz, NP  Multiple Vitamins-Minerals (MULTIVITAMIN WITH MINERALS) tablet Take 1 tablet by mouth daily.   Yes [provider]  nystatin cream (MYCOSTATIN) Apply 1 application topically 2 (two) times daily as needed for dry skin (skin irritation).   Yes [provider]  ondansetron (ZOFRAN ODT) 8 MG disintegrating tablet Take 1 tablet (8 mg total) by mouth every 8 (eight) hours as needed for nausea or vomiting. 02/06/18  Yes Dorie Rank, MD  oxybutynin (DITROPAN-XL) 10 MG 24 hr tablet Take 1 tablet (10 mg total) by mouth daily. 02/18/15  Yes Biagio Borg, MD  pantoprazole (PROTONIX) 40 MG tablet TAKE 1 TABLET(40 MG) BY MOUTH TWICE DAILY Patient taking differently: Take 40 mg by mouth 2  (two) times daily.  03/02/17  Yes Collene Gobble, MD  predniSONE (DELTASONE) 10 MG tablet Take 2 tablets (20 mg total) by mouth daily with breakfast. 09/03/17  Yes Aline August, MD  ranitidine (ZANTAC) 300 MG tablet Take 1 tablet (300 mg total) by mouth 2 (two) times daily. 11/22/17  Yes Debbe Odea, MD  sodium chloride (OCEAN) 0.65 % SOLN nasal spray Place 2 sprays into both nostrils 2 (two) times daily.   Yes [provider]  Tetrahydrozoline HCl (VISINE OP) Apply to eye.   Yes [provider]  traMADol (ULTRAM) 50 MG tablet Take 50 mg by mouth every 12 (twelve) hours as needed for moderate pain.   Yes [provider]  TURMERIC PO Take 1 tablet by mouth daily.   Yes [provider]  Insulin Syringe-Needle U-100 (INSULIN SYRINGE 1CC/31GX5/16") 31G X 5/16" 1 ML MISC USE TO INJECT INSULIN TWICE DAILY 05/11/17   Renato Shin, MD  ONE [facility] LANCETS MISC Use to check sugars twice daily DX E11.22 08/23/17   Renato Shin, MD  predniSONE (DELTASONE) 20 MG tablet TAKE 1 TABLET(20 MG) BY MOUTH DAILY WITH BREAKFAST Patient not taking: Reported on 02/11/2018 02/02/18   Collene Gobble, MD   Physical Exam: Blood pressure 113/78, pulse 80, resp. rate (!) 21, height 5\' 4"  (1.626 m), weight (!) 142 kg, SpO2 100 %. 1. General:  in No Acute distress  Chronically ill  -appearing 2. Psychological: Alert and  Oriented 3. Head/ENT:     Dry Mucous Membranes                          Head Non traumatic, neck supple                          Poor Dentition 4. SKIN: decreased Skin turgor,  Skin clean Dry multiple decubitus ulcers noted on the buttocks bilaterally in the gluteal cleft      5. Heart: Regular rate and rhythm no  Murmur, no Rub or gallop 6. Lungs: no wheezes or crackles   7. Abdomen: Soft,  non-tender, Non distended  obese  bowel sounds present 8. Lower extremities: no clubbing, cyanosis, or edema 9. Neurologically Grossly intact, moving all 4 extremities equally    10. MSK: Normal range of motion   LABS:  Recent Labs  Lab 02/06/18 0050 02/11/18 1541  WBC 11.0* 10.4  NEUTROABS 8.3* 8.5*  HGB 13.1 12.0  HCT 41.4 38.3  MCV 91.4 89.9  PLT 303 160   Basic Metabolic Panel: Recent Labs  Lab 02/06/18 0050 02/11/18 1541  NA 140 139  K 5.2* 5.0  CL 99 100  CO2 28 26  GLUCOSE 302* 268*  BUN 22 45*  CREATININE 1.05* 1.76*  CALCIUM 10.1 9.5      Recent Labs  Lab 02/11/18 1541  AST 13*  ALT 15  ALKPHOS 62  BILITOT 0.6  PROT 7.0  ALBUMIN 3.0*   No results for input(s): LIPASE, AMYLASE in the last 168 hours. No results for input(s): AMMONIA in the last 168 hours.    HbA1C: No results for input(s): HGBA1C in the last 72 hours. CBG: Recent Labs  Lab 02/06/18 0641 02/06/18 1250 02/06/18 1348  GLUCAP 150* 55* 104*      Urine analysis:    Component Value Date/Time   COLORURINE YELLOW 02/06/2018 0448   APPEARANCEUR CLEAR 02/06/2018 0448   LABSPEC 1.018 02/06/2018 0448   PHURINE 5.0 02/06/2018 0448   GLUCOSEU >=500 (A) 02/06/2018 0448   GLUCOSEU NEGATIVE 12/14/2011 1707   HGBUR NEGATIVE 02/06/2018 0448   BILIRUBINUR NEGATIVE 02/06/2018 0448   BILIRUBINUR n 11/16/2014 1515   KETONESUR NEGATIVE 02/06/2018 0448   PROTEINUR NEGATIVE 02/06/2018 0448   UROBILINOGEN 0.2 02/27/2015 2226   NITRITE NEGATIVE 02/06/2018 0448   LEUKOCYTESUR NEGATIVE 02/06/2018 0448       Cultures:    Component Value Date/Time   SDES BLOOD CENTRAL LINE 08/30/2017 1453   SPECREQUEST  08/30/2017 1453    BOTTLES DRAWN AEROBIC AND ANAEROBIC Blood Culture adequate volume   CULT (A) 08/30/2017 1453    STAPHYLOCOCCUS SPECIES (COAGULASE NEGATIVE) THE SIGNIFICANCE OF ISOLATING THIS ORGANISM FROM A SINGLE SET OF BLOOD CULTURES WHEN MULTIPLE SETS ARE DRAWN IS UNCERTAIN. PLEASE NOTIFY THE MICROBIOLOGY DEPARTMENT WITHIN ONE WEEK IF SPECIATION AND SENSITIVITIES ARE REQUIRED. Performed at [facility], [place] [address] [place], [place] [address]     REPTSTATUS 09/01/2017 FINAL 08/30/2017 1453     Radiological Exams on Admission: Dg Chest Portable 1 View  Result Date: 02/11/2018 CLINICAL DATA:  Hypertension. EXAM: PORTABLE CHEST 1 VIEW COMPARISON:  Radiograph December 14, 2017. FINDINGS: Stable cardiomediastinal silhouette. No pneumothorax or pleural effusion is noted. Both lungs are clear. The visualized skeletal structures are unremarkable. IMPRESSION: No acute cardiopulmonary abnormality seen. Electronically Signed   By: Marijo Conception, M.D.   On: 02/11/2018 16:35    Chart has been reviewed    Assessment/Plan   67 y.o. female with medical history significant of diabetes mellitus type 2, essential hypertension, anemia, gout, on chronic steroid  OSA on CPAP, gout, hx of MRSA Admitted for AKI dehydration and possible syncope   Present on Admission: . Syncope, vasovagal -in the setting of patient being moved and experiencing some discomfort patient states she normally never truly lost consciousness, will observe on telemetry rehydrate, given risk factors cycle cardiac enzymes . Dehydration-we will rehydrate and follow kidney function . AKI (acute kidney injury) ([place]) likely secondary to dehydration we will hold ACE inhibitor, rehydrate for creatinine . ANEMIA-NOS currently appears to be at baseline continue to monitor . DM neuropathy, type II diabetes mellitus ([place])-  - Order Sensitive SSI   - continue home insulin regimen ,  -  check TSH and HgA1C  - Hold by mouth medications   Vertigo recurrent patient  have had negative MRI and recent past would benefit from PT OT evaluation for benign positional vertigo . Essential hypertension hold home medications given hypotension . GERD stable continue home medications . Gout stable continue home medications . Obstructive sleep apnea - on CPAP . Morbid obesity (H[facility]) - chronic, Body mass index is 53.74 kg/m.  Nutritional consult  Decubitus ulcers will order wound care consult no evidence  of underlying infection . Hypotension - hold Home meds likely in the setting of dehydration we will hold home medications Debility patient will need PT OT evaluation spoke at length regarding need for placement at this point patient states she will think about it   will order social work consult    Other plan as per orders.  DVT prophylaxis:    Lovenox     Code Status:  FULL CODE  as per patient  I had personally discussed CODE STATUS with patient   Family Communication:   Family not   at  Bedside    Disposition Plan:     likely will need placement for rehabilitation                                                Would benefit from PT/OT eval prior to DC  Ordered                               Social Work  consulted                   Nutrition    consulted                  Wound care  consulted                                  Consults called:none  Admission status:  obs   Level of care   tele             Toy Baker 02/11/2018, 9:11 PM    [facility]  Pager [phone]   after 2 AM please page floor coverage PA If 7AM-7PM, please contact the day team taking care of the patient  [email or web address]  Password [email or web address]

## 2018-02-11 NOTE — ED Triage Notes (Addendum)
Patient BIB EMS from home with complaints of syncopal episodes and hypotension. Patient initially called BLS EMS for decubital ulcers to her bottom causing her pain. When the BLS unit attempted to transport patient, patient experienced a "syncopal episode" where she was staring off into space and was unresponsive to voice. Patient has history of vertigo. EMS blood pressure was 88/52. Patient has 22g PIV to left hand placed by EMS- received 129mL IV NS at this time. Patient CBG= 254. Patient AxOx4.

## 2018-02-11 NOTE — Progress Notes (Addendum)
CSW received a information from RN CM that pt could no longer receive [place] services and that pt's [facility] was refusing stating pt needed a igher level of care and that pt had refused inpatient SNF stay at [facility] provided to the pt by [facility].    Per [facility] ED RN CM, pt is to be D/C'd home without home Health once pt is medically cleared.  CSW will continue to follow for D/C needs.  Alphonse Guild. Nicholad Kautzman, LCSW, LCAS, CSI Clinical Social Worker Ph: [phone]

## 2018-02-11 NOTE — Progress Notes (Signed)
ED TO INPATIENT HANDOFF REPORT  Name/Age/Gender Christy May 67 y.o. female  Code Status Code Status History    Date Active Date Inactive Code Status Order ID Comments User Context   12/15/2017 0258 12/17/2017 1355 Full Code [id number]  Rise Patience, MD Inpatient   11/17/2017 2230 11/22/2017 1555 Full Code [id number]  Rise Patience, MD Inpatient   08/30/2017 1756 09/03/2017 2130 Full Code [id number]  Tarry Kos, MD ED   08/13/2017 0135 08/17/2017 1820 Full Code [id number]  Karmen Bongo, MD Inpatient   07/14/2015 0029 07/16/2015 2216 Full Code [id number]  Gennaro Africa, MD ED   09/02/2014 0919 09/05/2014 1738 Full Code [id number]  Dhungel, Flonnie Overman, MD Inpatient      Home/SNF/Other Home  Chief Complaint syncope   Level of Care/Admitting Diagnosis ED Disposition    ED Disposition Condition Comment   [facility]: [facility] [[id number]]  Level of Care: Telemetry [5]  Admit to tele based on following criteria: Eval of Syncope  Diagnosis: Syncope [458099]  Admitting Physician: Toy Baker [[id number]]  Attending Physician: Toy Baker [[id number]]  PT Class (Do Not Modify): Observation [104]  PT Acc Code (Do Not Modify): Observation [10022]       Medical History Past Medical History:  Diagnosis Date  . Allergy   . Anemia, unspecified   . Anxiety   . Arthritis    "neck, back, hands" (09/03/2014)  . Chronic airway obstruction, not elsewhere classified    "I was told I don't have this/tests done @ [facility] 05/2014"  . Chronic back pain   . Colon polyps   . Complication of anesthesia    "I've had recall; I probably stopped breathing at least 2 times" (09/03/2014)  . Dehydration   . Depressive disorder, not elsewhere classified   . Diabetic peripheral neuropathy ([place])   . Dysphagia 2007   historyof dysphagia with severe dysmotility by barium swallow-[name]  . Esophageal reflux   . Family history of adverse reaction to  anesthesia    "my brother was told they lost him a couple times during OR"  . Heart murmur   . History of hiatal hernia   . Migraine    "couple times/month right now" (09/03/2014)  . Obesity, unspecified   . OSA (obstructive sleep apnea)     CPAP  . Septic shock ([place]) 08/30/2017  . Spondylosis of unspecified site without mention of myelopathy   . Type II diabetes mellitus ([place])   . Unspecified essential hypertension   . Unspecified menopausal and postmenopausal disorder   . Unspecified vitamin D deficiency   . Upper airway cough syndrome     Allergies Allergies  Allergen Reactions  . Ace Inhibitors Anaphylaxis and Cough    Tolerates Irbesartan (home med)  . Penicillins Hives and Other (See Comments)    Tolerated ceftriaxone in 2019  Has patient had a PCN reaction causing immediate rash, facial/tongue/throat swelling, SOB or lightheadedness with hypotension: Yes Has patient had a PCN reaction causing severe rash involving mucus membranes or skin necrosis:  No Has patient had a PCN reaction that required hospitalization: No Has patient had a PCN reaction occurring within the last 10 years: No If all of the above answers are "NO", then may proceed with Cephalosporin use.  . Adhesive [Tape] Hives and Rash    IV Location/Drains/Wounds Patient Lines/Drains/Airways Status   Active Line/Drains/Airways    Name:   Placement date:   Placement time:   Site:   Days:  Peripheral IV 02/11/18 Left Hand   02/11/18    -    Hand   less than 1   Peripheral IV 02/11/18 Right Hand   02/11/18    1529    Hand   less than 1   External Urinary Catheter   12/16/17    1056    -   57   Pressure Injury 08/13/17 Stage II -  Partial thickness loss of dermis presenting as a shallow open ulcer with a red, pink wound bed without slough.   08/13/17    0146     182   Pressure Injury 08/12/17 Stage II -  Partial thickness loss of dermis presenting as a shallow open ulcer with a red, pink wound bed without  slough.   08/12/17    0030     183          Labs/Imaging Results for orders placed or performed during the hospital encounter of 02/11/18 (from the past 48 hour(s))  CBC with Differential     Status: Abnormal   Collection Time: 02/11/18  3:41 PM  Result Value Ref Range   WBC 10.4 4.0 - 10.5 K/uL   RBC 4.26 3.87 - 5.11 MIL/uL   Hemoglobin 12.0 12.0 - 15.0 g/dL   HCT 38.3 36.0 - 46.0 %   MCV 89.9 78.0 - 100.0 fL   MCH 28.2 26.0 - 34.0 pg   MCHC 31.3 30.0 - 36.0 g/dL   RDW 14.7 11.5 - 15.5 %   Platelets 279 150 - 400 K/uL   Neutrophils Relative % 82 %   Neutro Abs 8.5 (H) 1.7 - 7.7 K/uL   Lymphocytes Relative 13 %   Lymphs Abs 1.4 0.7 - 4.0 K/uL   Monocytes Relative 5 %   Monocytes Absolute 0.5 0.1 - 1.0 K/uL   Eosinophils Relative 0 %   Eosinophils Absolute 0.0 0.0 - 0.7 K/uL   Basophils Relative 0 %   Basophils Absolute 0.0 0.0 - 0.1 K/uL    Comment: Performed at [facility], [place] [address] [place], [place] [address]  Comprehensive metabolic panel     Status: Abnormal   Collection Time: 02/11/18  3:41 PM  Result Value Ref Range   Sodium 139 135 - 145 mmol/L   Potassium 5.0 3.5 - 5.1 mmol/L   Chloride 100 98 - 111 mmol/L   CO2 26 22 - 32 mmol/L   Glucose, Bld 268 (H) 70 - 99 mg/dL   BUN 45 (H) 8 - 23 mg/dL   Creatinine, Ser 1.76 (H) 0.44 - 1.00 mg/dL   Calcium 9.5 8.9 - 10.3 mg/dL   Total Protein 7.0 6.5 - 8.1 g/dL   Albumin 3.0 (L) 3.5 - 5.0 g/dL   AST 13 (L) 15 - 41 U/L   ALT 15 0 - 44 U/L   Alkaline Phosphatase 62 38 - 126 U/L   Total Bilirubin 0.6 0.3 - 1.2 mg/dL   GFR calc non Af Amer 29 (L) >60 mL/min   GFR calc Af Amer 33 (L) >60 mL/min    Comment: (NOTE) The eGFR has been calculated using the CKD EPI equation. This calculation has not been validated in all clinical situations. eGFR's persistently <60 mL/min signify possible Chronic Kidney Disease.    Anion gap 13 5 - 15    Comment: Performed at [facility], [place]  [address], [place], [place] [address]  Urinalysis, Routine w reflex microscopic     Status: Abnormal  Collection Time: 02/11/18  3:41 PM  Result Value Ref Range   Color, Urine YELLOW YELLOW   APPearance HAZY (A) CLEAR   Specific Gravity, Urine 1.013 1.005 - 1.030   pH 5.0 5.0 - 8.0   Glucose, UA 150 (A) NEGATIVE mg/dL   Hgb urine dipstick NEGATIVE NEGATIVE   Bilirubin Urine NEGATIVE NEGATIVE   Ketones, ur 5 (A) NEGATIVE mg/dL   Protein, ur NEGATIVE NEGATIVE mg/dL   Nitrite NEGATIVE NEGATIVE   Leukocytes, UA TRACE (A) NEGATIVE   RBC / HPF 0-5 0 - 5 RBC/hpf   WBC, UA 0-5 0 - 5 WBC/hpf   Bacteria, UA FEW (A) NONE SEEN   Squamous Epithelial / LPF 11-20 0 - 5    Comment: Performed at [facility], [place] [address], [place],  [address]  I-stat troponin, ED     Status: None   Collection Time: 02/11/18  3:59 PM  Result Value Ref Range   Troponin i, poc 0.00 0.00 - 0.08 ng/mL   Comment 3            Comment: Due to the release kinetics of cTnI, a negative result within the first hours of the onset of symptoms does not rule out myocardial infarction with certainty. If myocardial infarction is still suspected, repeat the test at appropriate intervals.   I-Stat CG4 Lactic Acid, ED     Status: None   Collection Time: 02/11/18  4:01 PM  Result Value Ref Range   Lactic Acid, Venous 1.41 0.5 - 1.9 mmol/L   *Note: Due to a large number of results and/or encounters for the requested time period, some results have not been displayed. A complete set of results can be found in Results Review.   Dg Chest Portable 1 View  Result Date: 02/11/2018 CLINICAL DATA:  Hypertension. EXAM: PORTABLE CHEST 1 VIEW COMPARISON:  Radiograph December 14, 2017. FINDINGS: Stable cardiomediastinal silhouette. No pneumothorax or pleural effusion is noted. Both lungs are clear. The visualized skeletal structures are unremarkable. IMPRESSION: No acute cardiopulmonary abnormality seen. Electronically  Signed   By: Marijo Conception, M.D.   On: 02/11/2018 16:35    Pending Labs Unresulted Labs (From admission, onward)    Start     Ordered   02/11/18 1921  Troponin I (q 6hr x 3)  Now then every 6 hours,   R     02/11/18 1920   02/11/18 1918  Sodium, urine, random  Once,   R     02/11/18 1917   02/11/18 1918  Creatinine, urine, random  Once,   R     02/11/18 1917   Signed and Held  Hemoglobin A1c  Tomorrow morning,   R    Comments:  To assess prior glycemic control    Signed and Held   Signed and Held  Prealbumin  Tomorrow morning,   R     Signed and Held   Signed and Held  Magnesium  Tomorrow morning,   R    Comments:  Call MD if <1.5    Signed and Held   Signed and Held  Phosphorus  Tomorrow morning,   R     Signed and Held   Signed and Held  TSH  Once,   R    Comments:  Cancel if already done within 1 month and notify MD    Signed and Held   Signed and Held  Comprehensive metabolic panel  Once,   R    Comments:  Cal MD for K<3.5 or >5.0    Signed and Held   Signed and Held  CBC  Once,   R    Comments:  Call for hg <8.0    Signed and Held          Vitals/Pain Today's Vitals   02/11/18 1830 02/11/18 1900 02/11/18 1930 02/11/18 2030  BP: 102/81 115/82 (!) 129/93 (!) 149/64  Pulse: 80 79  84  Resp: _0 (!) 23  SpO2: 99% 97%  92%  Weight:      Height:        Isolation Precautions No active isolations  Medications Medications  sodium chloride 0.9 % bolus 1,000 mL (1,000 mLs Intravenous New Bag/Given 02/11/18 2101)  sodium chloride 0.9 % bolus 1,000 mL (0 mLs Intravenous Stopped 02/11/18 1717)  ondansetron (ZOFRAN) injection 4 mg (4 mg Intravenous Given 02/11/18 1557)    Mobility non-ambulatory

## 2018-02-11 NOTE — Progress Notes (Signed)
Pt states that she will call when ready for CPAP, machine is ready at bedside.  RT to monitor and assess as needed.
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
## 2018-02-12 ENCOUNTER — Observation Stay (HOSPITAL_BASED_OUTPATIENT_CLINIC_OR_DEPARTMENT_OTHER): Payer: Medicare Other

## 2018-02-12 DIAGNOSIS — K219 Gastro-esophageal reflux disease without esophagitis: Secondary | ICD-10-CM | POA: Diagnosis not present

## 2018-02-12 DIAGNOSIS — J449 Chronic obstructive pulmonary disease, unspecified: Secondary | ICD-10-CM | POA: Diagnosis not present

## 2018-02-12 DIAGNOSIS — I10 Essential (primary) hypertension: Secondary | ICD-10-CM

## 2018-02-12 DIAGNOSIS — Z6841 Body Mass Index (BMI) 40.0 and over, adult: Secondary | ICD-10-CM | POA: Diagnosis not present

## 2018-02-12 DIAGNOSIS — N179 Acute kidney failure, unspecified: Secondary | ICD-10-CM | POA: Diagnosis not present

## 2018-02-12 DIAGNOSIS — I959 Hypotension, unspecified: Secondary | ICD-10-CM

## 2018-02-12 DIAGNOSIS — G8929 Other chronic pain: Secondary | ICD-10-CM | POA: Diagnosis not present

## 2018-02-12 DIAGNOSIS — D62 Acute posthemorrhagic anemia: Secondary | ICD-10-CM | POA: Diagnosis not present

## 2018-02-12 DIAGNOSIS — E860 Dehydration: Secondary | ICD-10-CM | POA: Diagnosis not present

## 2018-02-12 DIAGNOSIS — M4714 Other spondylosis with myelopathy, thoracic region: Secondary | ICD-10-CM | POA: Diagnosis not present

## 2018-02-12 DIAGNOSIS — R55 Syncope and collapse: Secondary | ICD-10-CM | POA: Diagnosis not present

## 2018-02-12 DIAGNOSIS — E1142 Type 2 diabetes mellitus with diabetic polyneuropathy: Secondary | ICD-10-CM | POA: Diagnosis not present

## 2018-02-12 DIAGNOSIS — G4733 Obstructive sleep apnea (adult) (pediatric): Secondary | ICD-10-CM | POA: Diagnosis not present

## 2018-02-12 DIAGNOSIS — G8220 Paraplegia, unspecified: Secondary | ICD-10-CM | POA: Diagnosis not present

## 2018-02-12 DIAGNOSIS — L89152 Pressure ulcer of sacral region, stage 2: Secondary | ICD-10-CM | POA: Diagnosis not present

## 2018-02-12 DIAGNOSIS — M4804 Spinal stenosis, thoracic region: Secondary | ICD-10-CM | POA: Diagnosis not present

## 2018-02-12 DIAGNOSIS — R531 Weakness: Secondary | ICD-10-CM | POA: Diagnosis not present

## 2018-02-12 LAB — HEMOGLOBIN A1C
Hgb A1c MFr Bld: 8.5 % — ABNORMAL HIGH (ref 4.8–5.6)
Mean Plasma Glucose: 197.25 mg/dL

## 2018-02-12 LAB — CBC
HEMATOCRIT: 37.5 % (ref 36.0–46.0)
Hemoglobin: 11.8 g/dL — ABNORMAL LOW (ref 12.0–15.0)
MCH: 28.4 pg (ref 26.0–34.0)
MCHC: 31.5 g/dL (ref 30.0–36.0)
MCV: 90.4 fL (ref 78.0–100.0)
Platelets: 220 10*3/uL (ref 150–400)
RBC: 4.15 MIL/uL (ref 3.87–5.11)
RDW: 14.7 % (ref 11.5–15.5)
WBC: 10.5 10*3/uL (ref 4.0–10.5)

## 2018-02-12 LAB — GLUCOSE, CAPILLARY
GLUCOSE-CAPILLARY: 247 mg/dL — AB (ref 70–99)
Glucose-Capillary: 351 mg/dL — ABNORMAL HIGH (ref 70–99)
Glucose-Capillary: 352 mg/dL — ABNORMAL HIGH (ref 70–99)
Glucose-Capillary: 290 mg/dL — ABNORMAL HIGH (ref 70–99)

## 2018-02-12 LAB — COMPREHENSIVE METABOLIC PANEL
ALBUMIN: 2.9 g/dL — AB (ref 3.5–5.0)
ALK PHOS: 61 U/L (ref 38–126)
ALT: 18 U/L (ref 0–44)
AST: 15 U/L (ref 15–41)
Anion gap: 11 (ref 5–15)
BUN: 39 mg/dL — AB (ref 8–23)
CO2: 26 mmol/L (ref 22–32)
Calcium: 9.1 mg/dL (ref 8.9–10.3)
Chloride: 101 mmol/L (ref 98–111)
Creatinine, Ser: 1.51 mg/dL — ABNORMAL HIGH (ref 0.44–1.00)
GFR calc Af Amer: 40 mL/min — ABNORMAL LOW (ref >60)
GFR calc non Af Amer: 34 mL/min — ABNORMAL LOW (ref >60)
GLUCOSE: 322 mg/dL — AB (ref 70–99)
POTASSIUM: 4.7 mmol/L (ref 3.5–5.1)
Sodium: 138 mmol/L (ref 135–145)
Total Bilirubin: 0.4 mg/dL (ref 0.3–1.2)
Total Protein: 6.7 g/dL (ref 6.5–8.1)

## 2018-02-12 LAB — PHOSPHORUS: Phosphorus: 3 mg/dL (ref 2.5–4.6)

## 2018-02-12 LAB — TSH: TSH: 0.828 u[IU]/mL (ref 0.350–4.500)

## 2018-02-12 LAB — SODIUM, URINE, RANDOM: Sodium, Ur: 89 mmol/L

## 2018-02-12 LAB — ECHOCARDIOGRAM COMPLETE
HEIGHTINCHES: 64 in
WEIGHTICAEL: 5008.85 [oz_av]

## 2018-02-12 LAB — CREATININE, URINE, RANDOM: CREATININE, URINE: 50.45 mg/dL

## 2018-02-12 LAB — TROPONIN I
TROPONIN I: 0.03 ng/mL — AB (ref <0.03)
Troponin I: 0.05 ng/mL (ref <0.03)
Troponin I: 0.04 ng/mL (ref <0.03)

## 2018-02-12 LAB — MAGNESIUM: Magnesium: 1.8 mg/dL (ref 1.7–2.4)

## 2018-02-12 LAB — PREALBUMIN: Prealbumin: 13.6 mg/dL — ABNORMAL LOW (ref 18–38)

## 2018-02-12 LAB — MRSA PCR SCREENING: MRSA by PCR: POSITIVE — AB

## 2018-02-12 MED ORDER — CHLORHEXIDINE GLUCONATE CLOTH 2 % EX PADS
6.0000 | MEDICATED_PAD | Freq: Every day | CUTANEOUS | Status: AC
  Administered 2018-02-12 – 2018-02-16 (×3): 6 via TOPICAL

## 2018-02-12 MED ORDER — MUPIROCIN 2 % EX OINT
1.0000 "application " | TOPICAL_OINTMENT | Freq: Two times a day (BID) | CUTANEOUS | Status: AC
  Administered 2018-02-12 – 2018-02-16 (×10): 1 via NASAL
  Filled 2018-02-12 (×2): qty 22

## 2018-02-12 MED ORDER — GERHARDT'S BUTT CREAM
TOPICAL_CREAM | Freq: Four times a day (QID) | CUTANEOUS | Status: DC
  Administered 2018-02-12 – 2018-02-15 (×9): via TOPICAL
  Administered 2018-02-15: 1 via TOPICAL
  Administered 2018-02-16 (×2): via TOPICAL
  Administered 2018-02-16: 1 via TOPICAL
  Administered 2018-02-18 – 2018-02-22 (×18): via TOPICAL
  Filled 2018-02-12 (×4): qty 1

## 2018-02-12 MED ORDER — MECLIZINE HCL 25 MG PO TABS
25.0000 mg | ORAL_TABLET | Freq: Three times a day (TID) | ORAL | Status: DC | PRN
  Administered 2018-02-14 – 2018-02-22 (×14): 25 mg via ORAL
  Filled 2018-02-12 (×15): qty 1

## 2018-02-12 MED ORDER — PERFLUTREN LIPID MICROSPHERE
1.0000 mL | INTRAVENOUS | Status: AC | PRN
  Administered 2018-02-12: 3 mL via INTRAVENOUS
  Filled 2018-02-12: qty 10

## 2018-02-12 MED ORDER — SODIUM CHLORIDE 0.9 % IV SOLN
INTRAVENOUS | Status: AC
  Administered 2018-02-12: 17:00:00 via INTRAVENOUS

## 2018-02-12 NOTE — Progress Notes (Signed)
Pt states that she will call if she decides to utilize CPAP QHS.  Machine is ready at bedside, RT to monitor and assess as needed.

## 2018-02-12 NOTE — Progress Notes (Signed)
  Echocardiogram 2D Echocardiogram has been performed.  Graylen Noboa G Dejae Bernet 02/12/2018, 2:04 PM

## 2018-02-12 NOTE — Progress Notes (Signed)
CRITICAL VALUE ALERT  Critical Value:  Troponin 0.03  Date & Time Notied:  02/12/2018; 0225  Provider Notified: X. Blount, NP  Orders Received/Actions taken: No new orders given at this time. Will continue to monitor and carry out any new orders. Carnella Guadalajara I

## 2018-02-12 NOTE — Progress Notes (Addendum)
PROGRESS NOTE    Christy May  RJJ:[id number] DOB: 1950/08/16 DOA: 02/11/2018 PCP: Lucianne Lei, MD   Brief Narrative: Christy May is a 67 y.o. female with medical history significant of diabetes mellitus type 2, essential hypertension, anemia, gout, on chronic steroid  OSA on CPAP, gout, hx of MRSA. She presented secondary to syncopal episode and hypotension.   Assessment & Plan:   Active Problems:   Morbid obesity ([place])   ANEMIA-NOS   Essential hypertension   GERD   Sleep apnea   Gout   Weakness generalized   Obstructive sleep apnea   DM neuropathy, type II diabetes mellitus (H[facility])   Dehydration   AKI (acute kidney injury) ([place])   Hypotension   Syncope, vasovagal   Syncope   Syncope Thought to be vasovagal. No arrythmia overnight. Troponin did rise minimally but is continuing to trend upwards. No chest pain. EKG significant for some anterior t-wave changes but is otherwise normal. Transthoracic Echocardiogram pending. -Transthoracic Echocardiogram -Telemetry  Dehydration Likely contributed to syncope. -IV fluids  Acute kidney injury Baseline of around 1. Peak of 1.76 and has improved to 1.51. -Continue IV fluids -Encourage further oral intake  Vertigo Patient is still symptomatic. -Meclizine prn -PT  Bilateral leg weakness Patient being followed by neurosurgery. Plan was for MRI of cervical and thoracic spine, however, patient was in the ED at time of her appointment. PT recommending SNF. Patient has been mostly bed bound secondary to weakness and falls. -MRI if patient is still inpatient during the week as her symptoms are a barrier to discharge  Pressure ulcers Buttock. In setting of recent bedbound status. Patient was not turning often.  -Frequent turning  Diabets mellitus with neuropathy On insulin as an outpatient -Continue NPH -Continue SSI  GERD -Continue Protonix  Gout -Continue allopurinol prednisone  OSA -CPAP qhs  Morbid  obesity Body mass index is 53.74 kg/m.   DVT prophylaxis: Lovenox Code Status:   Code Status: Full Code Family Communication: None at bedside Disposition Plan: Discharge to SNF when bed available and pending workup for elevated troponin/syncope and management of AKI   Consultants:   None  Procedures:   Transthoracic Echocardiogram (pending)  Antimicrobials:  None    Subjective: No chest pain overnight. Dizziness when she moves.   Objective: Vitals:   02/11/18 2148 02/12/18 0035 02/12/18 0215 02/12/18 0625  BP: 136/66  130/68 124/72  Pulse: 92  96 91  Resp: 16 18 18 20   Temp: 97.7 F (36.5 C)  97.7 F (36.5 C) 97.7 F (36.5 C)  TempSrc: Oral  Oral Oral  SpO2: 91%  90% 96%  Weight:      Height:        Intake/Output Summary (Last 24 hours) at 02/12/2018 1431 Last data filed at 02/12/2018 0630 Gross per 24 hour  Intake 1083.29 ml  Output 700 ml  Net 383.29 ml   Filed Weights   02/11/18 1455  Weight: (!) 142 kg    Examination:  General exam: Appears calm and comfortable Respiratory system: Clear to auscultation. Respiratory effort normal. Cardiovascular system: S1 & S2 heard, RRR. No murmurs, rubs, gallops or clicks. Gastrointestinal system: Abdomen is nondistended, soft and nontender. Normal bowel sounds heard. Central nervous system: Alert and oriented. 4/5 LLE vs 3/5 RLE strength Extremities: No calf tenderness Skin: No cyanosis. No rashes Psychiatry: Judgement and insight appear normal. Mood & affect appropriate.     Data Reviewed: I have personally reviewed following labs and imaging studies  CBC:  Recent Labs  Lab 02/06/18 0050 02/11/18 1541 02/12/18 0125  WBC 11.0* 10.4 10.5  NEUTROABS 8.3* 8.5*  --   HGB 13.1 12.0 11.8*  HCT 41.4 38.3 37.5  MCV 91.4 89.9 90.4  PLT 303 279 784   Basic Metabolic Panel: Recent Labs  Lab 02/06/18 0050 02/11/18 1541 02/12/18 0125  NA 140 139 138  K 5.2* 5.0 4.7  CL 99 100 101  CO2 28 26 26     GLUCOSE 302* 268* 322*  BUN 22 45* 39*  CREATININE 1.05* 1.76* 1.51*  CALCIUM 10.1 9.5 9.1  MG  --   --  1.8  PHOS  --   --  3.0   GFR: Estimated Creatinine Clearance: 50.4 mL/min (A) (by C-G formula based on SCr of 1.51 mg/dL (H)). Liver Function Tests: Recent Labs  Lab 02/11/18 1541 02/12/18 0125  AST 13* 15  ALT 15 18  ALKPHOS 62 61  BILITOT 0.6 0.4  PROT 7.0 6.7  ALBUMIN 3.0* 2.9*   No results for input(s): LIPASE, AMYLASE in the last 168 hours. No results for input(s): AMMONIA in the last 168 hours. Coagulation Profile: No results for input(s): INR, PROTIME in the last 168 hours. Cardiac Enzymes: Recent Labs  Lab 02/11/18 2039 02/12/18 0125 02/12/18 0733  TROPONINI <0.03 0.03* 0.05*   BNP (last 3 results) No results for input(s): PROBNP in the last 8760 hours. HbA1C: Recent Labs    02/12/18 0125  HGBA1C 8.5*   CBG: Recent Labs  Lab 02/06/18 1250 02/06/18 1348 02/11/18 2149 02/12/18 0738 02/12/18 1204  GLUCAP 55* 104* 257* 247* 351*   Lipid Profile: No results for input(s): CHOL, HDL, LDLCALC, TRIG, CHOLHDL, LDLDIRECT in the last 72 hours. Thyroid Function Tests: Recent Labs    02/12/18 0125  TSH 0.828   Anemia Panel: No results for input(s): VITAMINB12, FOLATE, FERRITIN, TIBC, IRON, RETICCTPCT in the last 72 hours. Sepsis Labs: Recent Labs  Lab 02/11/18 1601  LATICACIDVEN 1.41    Recent Results (from the past 240 hour(s))  MRSA PCR Screening     Status: Abnormal   Collection Time: 02/11/18 10:11 PM  Result Value Ref Range Status   MRSA by PCR POSITIVE (A) NEGATIVE Final    Comment:        The GeneXpert MRSA Assay (FDA approved for NASAL specimens only), is one component of a comprehensive MRSA colonization surveillance program. It is not intended to diagnose MRSA infection nor to guide or monitor treatment for MRSA infections. RESULT CALLED TO, READ BACK BY AND VERIFIED WITH: ASARO,J RN AT 6962 02/12/18 BY  TIBBITTS,K Performed at [facility], [place] [address], [place], [place] [address]          Radiology Studies: Dg Chest Portable 1 View  Result Date: 02/11/2018 CLINICAL DATA:  Hypertension. EXAM: PORTABLE CHEST 1 VIEW COMPARISON:  Radiograph December 14, 2017. FINDINGS: Stable cardiomediastinal silhouette. No pneumothorax or pleural effusion is noted. Both lungs are clear. The visualized skeletal structures are unremarkable. IMPRESSION: No acute cardiopulmonary abnormality seen. Electronically Signed   By: Marijo Conception, M.D.   On: 02/11/2018 16:35        Scheduled Meds: . allopurinol  300 mg Oral Daily  . aspirin EC  81 mg Oral QHS  . Chlorhexidine Gluconate Cloth  6 each Topical Q0600  . cyclobenzaprine  10 mg Oral Daily  . cycloSPORINE  1 drop Both Eyes BID  . enoxaparin (LOVENOX) injection  0.5 mg/kg Subcutaneous Q24H  . folic acid  1 mg Oral Daily  . gabapentin  800 mg Oral TID  . insulin aspart  0-5 Units Subcutaneous QHS  . insulin aspart  0-9 Units Subcutaneous TID WC  . insulin NPH Human  120 Units Subcutaneous QAC breakfast  . loratadine  10 mg Oral Daily  . montelukast  10 mg Oral QHS  . mupirocin ointment  1 application Nasal BID  . oxybutynin  10 mg Oral Daily  . pantoprazole  40 mg Oral BID  . predniSONE  20 mg Oral Q breakfast   Continuous Infusions:   LOS: 0 days     Cordelia Poche, MD [facility] 02/12/2018, 2:31 PM Pager: [phone]  If 7PM-7AM, please contact night-coverage [email or web address] 02/12/2018, 2:31 PM

## 2018-02-12 NOTE — Consult Note (Signed)
[facility] Nurse wound consult note Reason for Consult:Pai9tent with moisture lesions, not pressure. Scattered full and partial thickness lesions to bilateral buttocks from moisture (incontinence and heat) Wound type:MASD Pressure Injury POA: NA Measurement:Scattered lesions on the bilateral buttocks with the largest measuring 1.5cm round x 0.2cm and most measuring 0.5cm round x 0.1cm Wound KDX:IPJA, moist Drainage (amount, consistency, odor) scant serous Periwound: macerated Dressing procedure/placement/frequency: I will implement a bariatric sleep surface with low air loss feature to assist in microclimate management as well as the reduction of friction and shear forces in the affected area.  Additionally, I will provide a prescriptive skin barrier consisting of 1:1:1 antifungal (lotrimin)/skin barrier (zinc oxide)/ hydrocortisone in Gerhart's Butt Cream. Patient is to avoid the supine position except for meals, instead turning from side to side with HOB at or below a 30 degree angle. A pressure redistribution chair pad is provided for her use while OOB in chair and she may take that home with her upon discharge.  [place] nursing team will not follow, but will remain available to this patient, the nursing and medical teams.  Please re-consult if needed. Thanks, Maudie Flakes, MSN, RN, [place], Arther Abbott  Pager# [phone]

## 2018-02-12 NOTE — Evaluation (Signed)
Physical Therapy Evaluation Patient Details Name: Christy May MRN: [id number] DOB: 04-Oct-1950 Today's Date: 02/12/2018   History of Present Illness  Christy May is a 67 y.o. female with medical history significant of diabetes mellitus type 2, essential hypertension, anemia, gout, on chronic steroid  OSA on CPAP, gout, hx of MRSA, essentially bed bound at home.  Presented 02/11/18  with questionable syncopal episode today associated with hypotension  Clinical Impression  The patient reports  Being bedbound unless fire department comes to get her in [facility] and down step for  Appointments. Reports no OOB x 1 month. The patient relies on friends and family for most of care.  HH has been involved in past, uncertain of current status. Currently patient most likely will remain bedboind or out of bed via mechanical lift. She clearly needs LTC ina facility. Noted with multiple skin breakdown areas on buttocks. [place].  Pt admitted with above diagnosis. Pt currently with functional limitations due to the deficits listed below (see PT Problem List).  Pt will benefit from skilled PT to increase their independence and safety with mobility to allow discharge to the venue listed below.        Follow Up Recommendations SNF    Equipment Recommendations  (mechanical lift for home)    Recommendations for Other Services       Precautions / Restrictions Precautions Precautions: Fall Precaution Comments: has small areas of open wounds on buttocks. has been bed bond x > 79month. Calls firedept to get her OOB.      Mobility  Bed Mobility Overal bed mobility: Needs Assistance Bed Mobility: Rolling Rolling: Mod assist;+2 for physical assistance;+2 for safety/equipment;Max assist         General bed mobility comments: rolls to right with mod assist , patient able  to move the  left leg over right to facilitae rolling, uses rails. Requires assist with right leg  over left  to roll to left. patient com-plains of dizziness with each rolling.  Transfers                 General transfer comment: did not attempt. has been bed bound > 1 month. will need lift OOB  Ambulation/Gait                Stairs            Wheelchair Mobility    Modified Rankin (Stroke Patients Only)       Balance                                             Pertinent Vitals/Pain Pain Assessment: Faces Faces Pain Scale: Hurts little more Pain Location: right knee Pain Descriptors / Indicators: Discomfort Pain Intervention(s): Monitored during session    Home Living   Living Arrangements: Alone               Additional Comments: has friends that assist with ADL's and meals. Appears HH has been limited recently    Prior Function Level of Independence: Needs assistance               Hand Dominance        Extremity/Trunk Assessment        Lower Extremity Assessment Lower Extremity Assessment: RLE deficits/detail;LLE deficits/detail RLE Deficits / Details: dorsiflexion 1+. requires assist to lift to roll  to left RLE Sensation: history of peripheral neuropathy LLE Deficits / Details: dorsiflexion 3, able to flex knee and hip to assist roll to the right, places leg over right LLE Sensation: history of peripheral neuropathy    Cervical / Trunk Assessment Cervical / Trunk Assessment: (NT, remained in bed)  Communication   Communication: No difficulties  Cognition Arousal/Alertness: Awake/alert Behavior During Therapy: WFL for tasks assessed/performed Overall Cognitive Status: Within Functional Limits for tasks assessed                                        General Comments      Exercises     Assessment/Plan    PT Assessment Patient needs continued PT services  PT Problem List Decreased strength;Decreased range of motion;Decreased activity tolerance;Decreased mobility;Impaired  sensation;Decreased skin integrity;Pain       PT Treatment Interventions Functional mobility training;Therapeutic activities;Therapeutic exercise;Patient/family education    PT Goals (Current goals can be found in the Care Plan section)  Acute Rehab PT Goals Patient Stated Goal: to return home PT Goal Formulation: With patient Time For Goal Achievement: 02/26/18 Potential to Achieve Goals: Fair    Frequency Min 2X/week   Barriers to discharge Decreased caregiver support;Inaccessible home environment      Co-evaluation PT/OT/SLP Co-Evaluation/Treatment: Yes Reason for Co-Treatment: For patient/therapist safety;Complexity of the patient's impairments (multi-system involvement) PT goals addressed during session: Mobility/safety with mobility OT goals addressed during session: ADL's and self-care       AM-PAC PT "6 Clicks" Daily Activity  Outcome Measure Difficulty turning over in bed (including adjusting bedclothes, sheets and blankets)?: Unable Difficulty moving from lying on back to sitting on the side of the bed? : Unable Difficulty sitting down on and standing up from a chair with arms (e.g., wheelchair, bedside commode, etc,.)?: Unable Help needed moving to and from a bed to chair (including a wheelchair)?: Total Help needed walking in hospital room?: Total Help needed climbing 3-5 steps with a railing? : Total 6 Click Score: 6    End of Session   Activity Tolerance: Patient tolerated treatment well Patient left: in bed;with call bell/phone within reach;with bed alarm set Nurse Communication: Need for lift equipment;Mobility status PT Visit Diagnosis: Other abnormalities of gait and mobility (R26.89);Muscle weakness (generalized) (M62.81);History of falling (Z91.81);Dizziness and giddiness (R42)    Time: 7262-0355 PT Time Calculation (min) (ACUTE ONLY): 24 min   Charges:   PT Evaluation $PT Eval Moderate Complexity: 1 [address]  PT [phone]   Claretha Cooper 02/12/2018, 10:39 AM

## 2018-02-12 NOTE — Evaluation (Signed)
Occupational Therapy Evaluation Patient Details Name: Christy May MRN: [id number] DOB: November 25, 1950 Today's Date: 02/12/2018    History of Present Illness Christy May is a 67 y.o. female with medical history significant of diabetes mellitus type 2, essential hypertension, anemia, gout, on chronic steroid  OSA on CPAP, gout, hx of MRSA, essentially bed bound at home.  Presented 02/11/18  with questionable syncopal episode today associated with hypotension   Clinical Impression   This 67 year old female was admitted for the above. She was essentially bedbound for the past month and had friends and church members come in to help with adls and cleaning up from toileting as she could not access 3;1 commode.  [place] services recently stopped.  She needs up to total A +2 for LB ADLs from bed level. Will follow in acute setting with the goals listed below.  ?BPPV. Will further assess as she can tolerate position changes.  Of note, pt does have compromised skin on buttocks    Follow Up Recommendations  SNF    Equipment Recommendations  None recommended by OT    Recommendations for Other Services       Precautions / Restrictions Precautions Precautions: Fall Precaution Comments: has small areas of open wounds on buttocks. has been bed bond x > 4month. Calls firedept to get her OOB. Restrictions Weight Bearing Restrictions: No      Mobility Bed Mobility Overal bed mobility: Needs Assistance Bed Mobility: Rolling Rolling: Mod assist;+2 for physical assistance;+2 for safety/equipment;Max assist         General bed mobility comments: rolls to right with mod assist , patient able  to move the  left leg over right to facilitae rolling, uses rails. Requires assist with right leg  over left to roll to left. patient com-plains of dizziness with rolling to L  Transfers                 General transfer comment: did not attempt. has been bed bound > 1 month. will need lift OOB     Balance                                           ADL either performed or assessed with clinical judgement   ADL Overall ADL's : Needs assistance/impaired Eating/Feeding: Independent   Grooming: Set up   Upper Body Bathing: Set up   Lower Body Bathing: Maximal assistance +2   Upper Body Dressing : Minimal assistance   Lower Body Dressing: Total assistance+2       Toileting- Clothing Manipulation and Hygiene: Total assistance+2         General ADL Comments: all adls from bed level. Pt states she hasn't been out of bed in a month. When she had MD appt; firefighters came and got her oob into w/c and out of house     Vision         Perception     Praxis      Pertinent Vitals/Pain Pain Assessment: Faces Faces Pain Scale: Hurts little more Pain Location: right knee Pain Descriptors / Indicators: Discomfort Pain Intervention(s): Monitored during session     Hand Dominance     Extremity/Trunk Assessment Upper Extremity Assessment Upper Extremity Assessment: Generalized weakness(recruits biceps when lifting arms above 90)   Lower Extremity Assessment Lower Extremity Assessment: RLE deficits/detail;LLE deficits/detail RLE Deficits / Details: dorsiflexion 1+. requires  assist to lift to roll to left RLE Sensation: history of peripheral neuropathy LLE Deficits / Details: dorsiflexion 3, able to flex knee and hip to assist roll to the right, places leg over right LLE Sensation: history of peripheral neuropathy   Cervical / Trunk Assessment Cervical / Trunk Assessment: (NT, remained in bed)   Communication Communication Communication: No difficulties   Cognition Arousal/Alertness: Awake/alert Behavior During Therapy: WFL for tasks assessed/performed Overall Cognitive Status: Within Functional Limits for tasks assessed                                     General Comments  ? BPPV; need to work on sitting up activity tolerance to  further evaluate and tx if indicated    Exercises Exercises: Other exercises Other Exercises Other Exercises: educated on AROM for bil shoulders to 90 throughout day with arm straight   Shoulder Instructions      Home Living Family/patient expects to be discharged to:: Unsure Living Arrangements: Alone                               Additional Comments: has friends that assist with ADL's and meals. Appears HH has been limited recently      Prior Functioning/Environment Level of Independence: Needs assistance        Comments: friends have been assisting with adls/cleaning her up. She has been unable to get to [facility]        OT Problem List: Decreased strength;Decreased activity tolerance;Decreased knowledge of use of DME or AE;Obesity;Pain(balance NT)      OT Treatment/Interventions: Self-care/ADL training;DME and/or AE instruction;Therapeutic activities;Patient/family education;Therapeutic exercise;Energy conservation    OT Goals(Current goals can be found in the care plan section) Acute Rehab OT Goals Patient Stated Goal: to return home OT Goal Formulation: With patient Time For Goal Achievement: 02/26/18 Potential to Achieve Goals: Fair ADL Goals Additional ADL Goal #1: pt will tolerate sitting EOB with min guard x 5 minutes for adls and to further assess for BPPV Additional ADL Goal #2: pt will go from supine to sit with mod A in preparation for adls Additional ADL Goal #3: pt will roll to bil sides with min A for adls  OT Frequency: Min 2X/week   Barriers to D/C: Decreased caregiver support          Co-evaluation PT/OT/SLP Co-Evaluation/Treatment: Yes Reason for Co-Treatment: For patient/therapist safety;Complexity of the patient's impairments (multi-system involvement) PT goals addressed during session: Mobility/safety with mobility OT goals addressed during session: ADL's and self-care      AM-PAC PT "6 Clicks" Daily Activity     Outcome Measure  Help from another person eating meals?: None Help from another person taking care of personal grooming?: A Little Help from another person toileting, which includes using toliet, bedpan, or urinal?: A Lot Help from another person bathing (including washing, rinsing, drying)?: A Lot Help from another person to put on and taking off regular upper body clothing?: A Little Help from another person to put on and taking off regular lower body clothing?: Total 6 Click Score: 15   End of Session    Activity Tolerance: Patient limited by fatigue Patient left: in bed;with call bell/phone within reach;with bed alarm set  OT Visit Diagnosis: Muscle weakness (generalized) (M62.81)  Time: 0903-0149 OT Time Calculation (min): 24 min Charges:  OT General Charges $OT Visit: 1 Visit OT Evaluation $OT Eval Moderate Complexity: [place], OTR/L [phone] 02/12/2018  Edwyn Inclan 02/12/2018, 11:34 AM

## 2018-02-13 DIAGNOSIS — D649 Anemia, unspecified: Secondary | ICD-10-CM | POA: Diagnosis not present

## 2018-02-13 DIAGNOSIS — K219 Gastro-esophageal reflux disease without esophagitis: Secondary | ICD-10-CM | POA: Diagnosis present

## 2018-02-13 DIAGNOSIS — R55 Syncope and collapse: Secondary | ICD-10-CM | POA: Diagnosis not present

## 2018-02-13 DIAGNOSIS — R5381 Other malaise: Secondary | ICD-10-CM | POA: Diagnosis present

## 2018-02-13 DIAGNOSIS — M797 Fibromyalgia: Secondary | ICD-10-CM | POA: Diagnosis not present

## 2018-02-13 DIAGNOSIS — J309 Allergic rhinitis, unspecified: Secondary | ICD-10-CM | POA: Diagnosis present

## 2018-02-13 DIAGNOSIS — Z794 Long term (current) use of insulin: Secondary | ICD-10-CM | POA: Diagnosis not present

## 2018-02-13 DIAGNOSIS — E1140 Type 2 diabetes mellitus with diabetic neuropathy, unspecified: Secondary | ICD-10-CM | POA: Diagnosis not present

## 2018-02-13 DIAGNOSIS — M7138 Other bursal cyst, other site: Secondary | ICD-10-CM | POA: Diagnosis not present

## 2018-02-13 DIAGNOSIS — R2681 Unsteadiness on feet: Secondary | ICD-10-CM | POA: Diagnosis not present

## 2018-02-13 DIAGNOSIS — R52 Pain, unspecified: Secondary | ICD-10-CM | POA: Diagnosis not present

## 2018-02-13 DIAGNOSIS — G8929 Other chronic pain: Secondary | ICD-10-CM | POA: Diagnosis present

## 2018-02-13 DIAGNOSIS — Z981 Arthrodesis status: Secondary | ICD-10-CM | POA: Diagnosis not present

## 2018-02-13 DIAGNOSIS — S199XXA Unspecified injury of neck, initial encounter: Secondary | ICD-10-CM | POA: Diagnosis not present

## 2018-02-13 DIAGNOSIS — I959 Hypotension, unspecified: Secondary | ICD-10-CM | POA: Diagnosis present

## 2018-02-13 DIAGNOSIS — J449 Chronic obstructive pulmonary disease, unspecified: Secondary | ICD-10-CM | POA: Diagnosis present

## 2018-02-13 DIAGNOSIS — Z419 Encounter for procedure for purposes other than remedying health state, unspecified: Secondary | ICD-10-CM | POA: Diagnosis not present

## 2018-02-13 DIAGNOSIS — M2550 Pain in unspecified joint: Secondary | ICD-10-CM | POA: Diagnosis not present

## 2018-02-13 DIAGNOSIS — M4804 Spinal stenosis, thoracic region: Secondary | ICD-10-CM | POA: Diagnosis present

## 2018-02-13 DIAGNOSIS — E1121 Type 2 diabetes mellitus with diabetic nephropathy: Secondary | ICD-10-CM | POA: Diagnosis present

## 2018-02-13 DIAGNOSIS — M4803 Spinal stenosis, cervicothoracic region: Secondary | ICD-10-CM | POA: Diagnosis not present

## 2018-02-13 DIAGNOSIS — N3941 Urge incontinence: Secondary | ICD-10-CM | POA: Diagnosis present

## 2018-02-13 DIAGNOSIS — Z7401 Bed confinement status: Secondary | ICD-10-CM | POA: Diagnosis not present

## 2018-02-13 DIAGNOSIS — Z01818 Encounter for other preprocedural examination: Secondary | ICD-10-CM | POA: Diagnosis not present

## 2018-02-13 DIAGNOSIS — L89152 Pressure ulcer of sacral region, stage 2: Secondary | ICD-10-CM | POA: Diagnosis present

## 2018-02-13 DIAGNOSIS — I10 Essential (primary) hypertension: Secondary | ICD-10-CM | POA: Diagnosis not present

## 2018-02-13 DIAGNOSIS — D62 Acute posthemorrhagic anemia: Secondary | ICD-10-CM | POA: Diagnosis not present

## 2018-02-13 DIAGNOSIS — M549 Dorsalgia, unspecified: Secondary | ICD-10-CM | POA: Diagnosis present

## 2018-02-13 DIAGNOSIS — N179 Acute kidney failure, unspecified: Secondary | ICD-10-CM | POA: Diagnosis not present

## 2018-02-13 DIAGNOSIS — L89323 Pressure ulcer of left buttock, stage 3: Secondary | ICD-10-CM | POA: Diagnosis not present

## 2018-02-13 DIAGNOSIS — L89313 Pressure ulcer of right buttock, stage 3: Secondary | ICD-10-CM | POA: Diagnosis not present

## 2018-02-13 DIAGNOSIS — M109 Gout, unspecified: Secondary | ICD-10-CM | POA: Diagnosis present

## 2018-02-13 DIAGNOSIS — E559 Vitamin D deficiency, unspecified: Secondary | ICD-10-CM | POA: Diagnosis not present

## 2018-02-13 DIAGNOSIS — G4733 Obstructive sleep apnea (adult) (pediatric): Secondary | ICD-10-CM | POA: Diagnosis not present

## 2018-02-13 DIAGNOSIS — S299XXA Unspecified injury of thorax, initial encounter: Secondary | ICD-10-CM | POA: Diagnosis not present

## 2018-02-13 DIAGNOSIS — M4323 Fusion of spine, cervicothoracic region: Secondary | ICD-10-CM | POA: Diagnosis not present

## 2018-02-13 DIAGNOSIS — E860 Dehydration: Secondary | ICD-10-CM | POA: Diagnosis not present

## 2018-02-13 DIAGNOSIS — E1142 Type 2 diabetes mellitus with diabetic polyneuropathy: Secondary | ICD-10-CM | POA: Diagnosis present

## 2018-02-13 DIAGNOSIS — G992 Myelopathy in diseases classified elsewhere: Secondary | ICD-10-CM | POA: Diagnosis not present

## 2018-02-13 DIAGNOSIS — R42 Dizziness and giddiness: Secondary | ICD-10-CM | POA: Diagnosis present

## 2018-02-13 DIAGNOSIS — M4714 Other spondylosis with myelopathy, thoracic region: Secondary | ICD-10-CM | POA: Diagnosis present

## 2018-02-13 DIAGNOSIS — F419 Anxiety disorder, unspecified: Secondary | ICD-10-CM | POA: Diagnosis present

## 2018-02-13 DIAGNOSIS — Z6841 Body Mass Index (BMI) 40.0 and over, adult: Secondary | ICD-10-CM | POA: Diagnosis not present

## 2018-02-13 DIAGNOSIS — M6281 Muscle weakness (generalized): Secondary | ICD-10-CM | POA: Diagnosis not present

## 2018-02-13 DIAGNOSIS — G8220 Paraplegia, unspecified: Secondary | ICD-10-CM | POA: Diagnosis present

## 2018-02-13 DIAGNOSIS — Z4789 Encounter for other orthopedic aftercare: Secondary | ICD-10-CM | POA: Diagnosis not present

## 2018-02-13 LAB — BASIC METABOLIC PANEL
ANION GAP: 11 (ref 5–15)
BUN: 23 mg/dL (ref 8–23)
CALCIUM: 9.5 mg/dL (ref 8.9–10.3)
CHLORIDE: 102 mmol/L (ref 98–111)
CO2: 26 mmol/L (ref 22–32)
CREATININE: 1.32 mg/dL — AB (ref 0.44–1.00)
GFR calc non Af Amer: 40 mL/min — ABNORMAL LOW (ref >60)
GFR, EST AFRICAN AMERICAN: 47 mL/min — AB (ref >60)
Glucose, Bld: 352 mg/dL — ABNORMAL HIGH (ref 70–99)
Potassium: 4.8 mmol/L (ref 3.5–5.1)
SODIUM: 139 mmol/L (ref 135–145)

## 2018-02-13 LAB — GLUCOSE, CAPILLARY
GLUCOSE-CAPILLARY: 358 mg/dL — AB (ref 70–99)
Glucose-Capillary: 251 mg/dL — ABNORMAL HIGH (ref 70–99)
Glucose-Capillary: 290 mg/dL — ABNORMAL HIGH (ref 70–99)
Glucose-Capillary: 268 mg/dL — ABNORMAL HIGH (ref 70–99)

## 2018-02-13 NOTE — Progress Notes (Signed)
Pt has declined use of CPAP tonight, Machine remains at Pt's bedside.  RT to monitor and assess as needed.

## 2018-02-13 NOTE — Progress Notes (Signed)
PROGRESS NOTE    ROGUE PAUTLER  ATF:[id number] DOB: Dec 21, 1950 DOA: 02/11/2018 PCP: Lucianne Lei, MD   Brief Narrative: Christy May is a 67 y.o. female with medical history significant of diabetes mellitus type 2, essential hypertension, anemia, gout, on chronic steroid  OSA on CPAP, gout, hx of MRSA. She presented secondary to syncopal episode and hypotension.   Assessment & Plan:   Active Problems:   Morbid obesity ([place])   ANEMIA-NOS   Essential hypertension   GERD   Sleep apnea   Gout   Weakness generalized   Obstructive sleep apnea   DM neuropathy, type II diabetes mellitus (HCC)   Dehydration   AKI (acute kidney injury) ([place])   Hypotension   Syncope, vasovagal   Syncope   Syncope Thought to be vasovagal. No arrythmia overnight. Troponin did rise minimally but is continuing to trend upwards. No chest pain. EKG significant for some anterior t-wave changes but is otherwise normal. Transthoracic Echocardiogram unremarkable for dysfunction or valvular stenosis -Telemetry  Dehydration Likely contributed to syncope. Improved.  Acute kidney injury Baseline of around 1. Peak of 1.76 and has improved to 1.51. -Encourage further oral intake -Repeat BMP today  Vertigo Patient is still symptomatic. -Meclizine prn -PT  Bilateral leg weakness Patient being followed by neurosurgery. Plan was for MRI of cervical and thoracic spine, however, patient was in the ED at time of her appointment. PT recommending SNF. Patient has been mostly bed bound secondary to weakness and falls. -MRI if patient is still inpatient during the week as her symptoms are a barrier to discharge  Pressure ulcers Buttock. In setting of recent bedbound status. Patient was not turning often.  -Frequent turning  Diabets mellitus with neuropathy On insulin as an outpatient -Continue NPH -Continue SSI  GERD -Continue Protonix  Gout -Continue allopurinol prednisone  OSA -CPAP  qhs  Morbid obesity Body mass index is 53.74 kg/m.   DVT prophylaxis: Lovenox Code Status:   Code Status: Full Code Family Communication: None at bedside Disposition Plan: Discharge to SNF when bed available   Consultants:   None  Procedures:   8/24: Transthoracic Echocardiogram Study Conclusions  - Left ventricle: The cavity size was normal. Wall thickness was   increased in a pattern of mild LVH. Systolic function was normal.   The estimated ejection fraction was in the range of 55% to 60%.   Wall motion was normal; there were no regional wall motion   abnormalities. The study is not technically sufficient to allow   evaluation of LV diastolic function. - Aortic valve: Mildly to moderately calcified annulus. Moderately   thickened leaflets. Mean gradient (S): 8 mm Hg. Valve area (VTI):   1.79 cm^2. Valve area (Vmax): 1.52 cm^2. - Technically difficult study. Echocontrast was used to enhance   visualization.  Antimicrobials:  None    Subjective: No issues overnight. No chest pain.  Objective: Vitals:   02/12/18 1527 02/12/18 2109 02/13/18 0035 02/13/18 0545  BP: 140/75 123/71  123/74  Pulse: (!) 105 94  (!) 108  Resp: 20 20 18 18   Temp: 98.2 F (36.8 C) 97.7 F (36.5 C)  97.8 F (36.6 C)  TempSrc: Oral Oral  Oral  SpO2:  97%  90%  Weight:      Height:        Intake/Output Summary (Last 24 hours) at 02/13/2018 1324 Last data filed at 02/13/2018 0545 Gross per 24 hour  Intake 805.81 ml  Output 2700 ml  Net -1894.19 ml  Filed Weights   02/11/18 1455  Weight: (!) 142 kg    Examination:  General exam: Appears calm and comfortable Respiratory system: Clear to auscultation. Respiratory effort normal. Cardiovascular system: S1 & S2 heard, RRR. No murmurs. Gastrointestinal system: Abdomen is nondistended, soft and nontender. Normal bowel sounds heard. Central nervous system: Alert and oriented. No focal neurological deficits. Extremities: No calf  tenderness Skin: No cyanosis. No rashes Psychiatry: Judgement and insight appear normal. Mood & affect appropriate.    Data Reviewed: I have personally reviewed following labs and imaging studies  CBC: Recent Labs  Lab 02/11/18 1541 02/12/18 0125  WBC 10.4 10.5  NEUTROABS 8.5*  --   HGB 12.0 11.8*  HCT 38.3 37.5  MCV 89.9 90.4  PLT 279 382   Basic Metabolic Panel: Recent Labs  Lab 02/11/18 1541 02/12/18 0125  NA 139 138  K 5.0 4.7  CL 100 101  CO2 26 26  GLUCOSE 268* 322*  BUN 45* 39*  CREATININE 1.76* 1.51*  CALCIUM 9.5 9.1  MG  --  1.8  PHOS  --  3.0   GFR: Estimated Creatinine Clearance: 50.4 mL/min (A) (by C-G formula based on SCr of 1.51 mg/dL (H)). Liver Function Tests: Recent Labs  Lab 02/11/18 1541 02/12/18 0125  AST 13* 15  ALT 15 18  ALKPHOS 62 61  BILITOT 0.6 0.4  PROT 7.0 6.7  ALBUMIN 3.0* 2.9*   No results for input(s): LIPASE, AMYLASE in the last 168 hours. No results for input(s): AMMONIA in the last 168 hours. Coagulation Profile: No results for input(s): INR, PROTIME in the last 168 hours. Cardiac Enzymes: Recent Labs  Lab 02/11/18 2039 02/12/18 0125 02/12/18 0733 02/12/18 1454  TROPONINI <0.03 0.03* 0.05* 0.04*   BNP (last 3 results) No results for input(s): PROBNP in the last 8760 hours. HbA1C: Recent Labs    02/12/18 0125  HGBA1C 8.5*   CBG: Recent Labs  Lab 02/12/18 1204 02/12/18 1813 02/12/18 2106 02/13/18 0737 02/13/18 1138  GLUCAP 351* 352* 290* 251* 290*   Lipid Profile: No results for input(s): CHOL, HDL, LDLCALC, TRIG, CHOLHDL, LDLDIRECT in the last 72 hours. Thyroid Function Tests: Recent Labs    02/12/18 0125  TSH 0.828   Anemia Panel: No results for input(s): VITAMINB12, FOLATE, FERRITIN, TIBC, IRON, RETICCTPCT in the last 72 hours. Sepsis Labs: Recent Labs  Lab 02/11/18 1601  LATICACIDVEN 1.41    Recent Results (from the past 240 hour(s))  MRSA PCR Screening     Status: Abnormal    Collection Time: 02/11/18 10:11 PM  Result Value Ref Range Status   MRSA by PCR POSITIVE (A) NEGATIVE Final    Comment:        The GeneXpert MRSA Assay (FDA approved for NASAL specimens only), is one component of a comprehensive MRSA colonization surveillance program. It is not intended to diagnose MRSA infection nor to guide or monitor treatment for MRSA infections. RESULT CALLED TO, READ BACK BY AND VERIFIED WITH: ASARO,J RN AT 5053 02/12/18 BY TIBBITTS,K Performed at [facility], [place] [address] [place],  [address]          Radiology Studies: Dg Chest Portable 1 View  Result Date: 02/11/2018 CLINICAL DATA:  Hypertension. EXAM: PORTABLE CHEST 1 VIEW COMPARISON:  Radiograph December 14, 2017. FINDINGS: Stable cardiomediastinal silhouette. No pneumothorax or pleural effusion is noted. Both lungs are clear. The visualized skeletal structures are unremarkable. IMPRESSION: No acute cardiopulmonary abnormality seen. Electronically Signed   By: Jeneen Rinks  Murlean Caller, M.D.   On: 02/11/2018 16:35        Scheduled Meds: . allopurinol  300 mg Oral Daily  . aspirin EC  81 mg Oral QHS  . Chlorhexidine Gluconate Cloth  6 each Topical Q0600  . cyclobenzaprine  10 mg Oral Daily  . cycloSPORINE  1 drop Both Eyes BID  . enoxaparin (LOVENOX) injection  0.5 mg/kg Subcutaneous Q24H  . folic acid  1 mg Oral Daily  . gabapentin  800 mg Oral TID  . Gerhardt's butt cream   Topical QID  . insulin aspart  0-5 Units Subcutaneous QHS  . insulin aspart  0-9 Units Subcutaneous TID WC  . insulin NPH Human  120 Units Subcutaneous QAC breakfast  . loratadine  10 mg Oral Daily  . montelukast  10 mg Oral QHS  . mupirocin ointment  1 application Nasal BID  . oxybutynin  10 mg Oral Daily  . pantoprazole  40 mg Oral BID  . predniSONE  20 mg Oral Q breakfast   Continuous Infusions:   LOS: 0 days     Cordelia Poche, MD [facility] 02/13/2018, 1:24 PM Pager: [phone]  If 7PM-7AM, please contact night-coverage [email or web address] 02/13/2018, 1:24 PM

## 2018-02-13 NOTE — Progress Notes (Signed)
Nutrition Brief Note  RD consulted for nutritional assessment.  Weight is stable. Pt consuming 60-80% of meals. Supplements not indicated unless PO intake decreases.  Wt Readings from Last 15 Encounters:  02/11/18 (!) 142 kg  02/05/18 (!) 142.4 kg  01/25/18 (!) 142.4 kg  12/14/17 (!) 142.4 kg  11/22/17 (!) 141 kg  09/03/17 (!) 144.2 kg  08/15/17 (!) 188.7 kg  05/11/17 (!) 150.3 kg  03/15/17 (!) 147.9 kg  03/09/17 (!) 146.1 kg  12/16/16 (!) 142.9 kg  11/17/16 (!) 143.3 kg  11/11/16 (!) 143.7 kg  11/06/16 (!) 143.5 kg  09/28/16 (!) 141.5 kg    Body mass index is 53.74 kg/m. Patient meets criteria for morbid obesity based on current BMI.   Current diet order is CHO modified, patient is consuming approximately 60-80%% of meals at this time. Labs and medications reviewed.   No nutrition interventions warranted at this time. If nutrition issues arise, please consult RD.   Clayton Bibles, MS, RD, [place] Dietitian Pager: [phone] After Hours Pager: [phone]

## 2018-02-14 ENCOUNTER — Inpatient Hospital Stay (HOSPITAL_COMMUNITY): Payer: Medicare Other

## 2018-02-14 ENCOUNTER — Other Ambulatory Visit: Payer: Self-pay | Admitting: *Deleted

## 2018-02-14 LAB — GLUCOSE, CAPILLARY
GLUCOSE-CAPILLARY: 222 mg/dL — AB (ref 70–99)
GLUCOSE-CAPILLARY: 316 mg/dL — AB (ref 70–99)
Glucose-Capillary: 331 mg/dL — ABNORMAL HIGH (ref 70–99)

## 2018-02-14 MED FILL — Perflutren Lipid Microsphere IV Susp 1.1 MG/ML: INTRAVENOUS | Qty: 10 | Status: AC

## 2018-02-14 NOTE — Progress Notes (Signed)
PROGRESS NOTE    Christy May  LOV:[id number] DOB: [birth year] DOA: 02/11/2018 PCP: Lucianne Lei, MD   Brief Narrative: Christy May is a 67 y.o. female with medical history significant of diabetes mellitus type 2, essential hypertension, anemia, gout, on chronic steroid  OSA on CPAP, gout, hx of MRSA. She presented secondary to syncopal episode and hypotension.   Assessment & Plan:   Active Problems:   Morbid obesity ([place])   ANEMIA-NOS   Essential hypertension   GERD   Sleep apnea   Gout   Weakness generalized   Obstructive sleep apnea   DM neuropathy, type II diabetes mellitus (HCC)   Dehydration   AKI (acute kidney injury) ([place])   Hypotension   Syncope, vasovagal   Syncope   Syncope Thought to be vasovagal. No arrythmia overnight. Troponin did rise minimally but is continuing to trend upwards. No chest pain. EKG significant for some anterior t-wave changes but is otherwise normal. Transthoracic Echocardiogram unremarkable for dysfunction or valvular stenosis.  Dehydration Likely contributed to syncope. Improved.  Acute kidney injury Baseline of around 1. Peak of 1.76 and has improved to 1.51. Continues to improve with oral hydration. -Encourage further oral intake  Vertigo Patient is symptomatic. She has not used medication -Meclizine prn -PT  Bilateral leg weakness Patient being followed by neurosurgery. Plan was for MRI of cervical and thoracic spine, however, patient was in the ED at time of her appointment. PT recommending SNF. Patient has been mostly bed bound acutely secondary to weakness and falls. -MRI cervical/thoracic spine  Pressure ulcers Buttock. In setting of recent bedbound status. Patient was not turning often.  -Frequent turning  Diabets mellitus with neuropathy On insulin as an outpatient -Continue NPH -Continue SSI  GERD -Continue Protonix  Gout -Continue allopurinol prednisone  OSA -CPAP qhs  Morbid obesity Body mass  index is 53.74 kg/m.   DVT prophylaxis: Lovenox Code Status:   Code Status: Full Code Family Communication: None at bedside Disposition Plan: Discharge to SNF when bed available   Consultants:   None  Procedures:   8/24: Transthoracic Echocardiogram Study Conclusions  - Left ventricle: The cavity size was normal. Wall thickness was   increased in a pattern of mild LVH. Systolic function was normal.   The estimated ejection fraction was in the range of 55% to 60%.   Wall motion was normal; there were no regional wall motion   abnormalities. The study is not technically sufficient to allow   evaluation of LV diastolic function. - Aortic valve: Mildly to moderately calcified annulus. Moderately   thickened leaflets. Mean gradient (S): 8 mm Hg. Valve area (VTI):   1.79 cm^2. Valve area (Vmax): 1.52 cm^2. - Technically difficult study. Echocontrast was used to enhance   visualization.  Antimicrobials:  None    Subjective: No concerns overnight.  Objective: Vitals:   02/13/18 0545 02/13/18 1415 02/13/18 2119 02/14/18 0540  BP: 123/74 107/84 130/82 (!) 144/120  Pulse: (!) 108 (!) 116 (!) 101 87  Resp: 18 18 12 12   Temp: 97.8 F (36.6 C) 98.3 F (36.8 C) 97.7 F (36.5 C) 97.8 F (36.6 C)  TempSrc: Oral Oral Oral Oral  SpO2: 90% 92% (!) 88% 96%  Weight:      Height:        Intake/Output Summary (Last 24 hours) at 02/14/2018 1339 Last data filed at 02/14/2018 1000 Gross per 24 hour  Intake 840 ml  Output 2450 ml  Net -1610 ml   [employer]  02/11/18 1455  Weight: (!) 142 kg    Examination:  General exam: Appears calm and comfortable Respiratory system: Clear to auscultation. Respiratory effort normal. Cardiovascular system: S1 & S2 heard, tachycardia, normal rhythm. No murmurs. Gastrointestinal system: Abdomen is nondistended, soft and nontender. No organomegaly or masses felt. Normal bowel sounds heard. Central nervous system: Alert and oriented. No  focal neurological deficits. Extremities: LE edema. No calf tenderness Skin: No cyanosis. No rashes Psychiatry: Judgement and insight appear normal. Mood & affect appropriate.   Data Reviewed: I have personally reviewed following labs and imaging studies  CBC: Recent Labs  Lab 02/11/18 1541 02/12/18 0125  WBC 10.4 10.5  NEUTROABS 8.5*  --   HGB 12.0 11.8*  HCT 38.3 37.5  MCV 89.9 90.4  PLT 279 149   Basic Metabolic Panel: Recent Labs  Lab 02/11/18 1541 02/12/18 0125 02/13/18 1431  NA 139 138 139  K 5.0 4.7 4.8  CL 100 101 102  CO2 26 26 26   GLUCOSE 268* 322* 352*  BUN 45* 39* 23  CREATININE 1.76* 1.51* 1.32*  CALCIUM 9.5 9.1 9.5  MG  --  1.8  --   PHOS  --  3.0  --    GFR: Estimated Creatinine Clearance: 57.7 mL/min (A) (by C-G formula based on SCr of 1.32 mg/dL (H)). Liver Function Tests: Recent Labs  Lab 02/11/18 1541 02/12/18 0125  AST 13* 15  ALT 15 18  ALKPHOS 62 61  BILITOT 0.6 0.4  PROT 7.0 6.7  ALBUMIN 3.0* 2.9*   No results for input(s): LIPASE, AMYLASE in the last 168 hours. No results for input(s): AMMONIA in the last 168 hours. Coagulation Profile: No results for input(s): INR, PROTIME in the last 168 hours. Cardiac Enzymes: Recent Labs  Lab 02/11/18 2039 02/12/18 0125 02/12/18 0733 02/12/18 1454  TROPONINI <0.03 0.03* 0.05* 0.04*   BNP (last 3 results) No results for input(s): PROBNP in the last 8760 hours. HbA1C: Recent Labs    02/12/18 0125  HGBA1C 8.5*   CBG: Recent Labs  Lab 02/13/18 0737 02/13/18 1138 02/13/18 1642 02/13/18 2117 02/14/18 1100  GLUCAP 251* 290* 358* 268* 222*   Lipid Profile: No results for input(s): CHOL, HDL, LDLCALC, TRIG, CHOLHDL, LDLDIRECT in the last 72 hours. Thyroid Function Tests: Recent Labs    02/12/18 0125  TSH 0.828   Anemia Panel: No results for input(s): VITAMINB12, FOLATE, FERRITIN, TIBC, IRON, RETICCTPCT in the last 72 hours. Sepsis Labs: Recent Labs  Lab 02/11/18 1601    LATICACIDVEN 1.41    Recent Results (from the past 240 hour(s))  MRSA PCR Screening     Status: Abnormal   Collection Time: 02/11/18 10:11 PM  Result Value Ref Range Status   MRSA by PCR POSITIVE (A) NEGATIVE Final    Comment:        The GeneXpert MRSA Assay (FDA approved for NASAL specimens only), is one component of a comprehensive MRSA colonization surveillance program. It is not intended to diagnose MRSA infection nor to guide or monitor treatment for MRSA infections. RESULT CALLED TO, READ BACK BY AND VERIFIED WITH: ASARO,J RN AT 7026 02/12/18 BY TIBBITTS,K Performed at [facility], [place] [address] [place], [place] [address]          Radiology Studies: No results found.      Scheduled Meds: . allopurinol  300 mg Oral Daily  . aspirin EC  81 mg Oral QHS  . Chlorhexidine Gluconate Cloth  6 each Topical Q0600  .  cyclobenzaprine  10 mg Oral Daily  . cycloSPORINE  1 drop Both Eyes BID  . enoxaparin (LOVENOX) injection  0.5 mg/kg Subcutaneous Q24H  . folic acid  1 mg Oral Daily  . gabapentin  800 mg Oral TID  . Gerhardt's butt cream   Topical QID  . insulin aspart  0-5 Units Subcutaneous QHS  . insulin aspart  0-9 Units Subcutaneous TID WC  . insulin NPH Human  120 Units Subcutaneous QAC breakfast  . loratadine  10 mg Oral Daily  . montelukast  10 mg Oral QHS  . mupirocin ointment  1 application Nasal BID  . oxybutynin  10 mg Oral Daily  . pantoprazole  40 mg Oral BID  . predniSONE  20 mg Oral Q breakfast   Continuous Infusions:   LOS: 1 day     Cordelia Poche, MD [facility] 02/14/2018, 1:39 PM Pager: [phone]  If 7PM-7AM, please contact night-coverage [email or web address] 02/14/2018, 1:39 PM

## 2018-02-14 NOTE — Care Management Note (Signed)
Case Management Note  Patient Details  Name: Christy May MRN: [id number] Date of Birth: Nov 18, 1950  Subjective/Objective:  Admitted w/dehydration,syncope. HxMRSA,DM,bedbound x 71month.PT/OT-SNF. CSW following for SNF.                  Action/Plan:dc SNF   Expected Discharge Date:  02/14/18               Expected Discharge Plan:  Home/Self Care  In-House Referral:  Clinical Social Work  Discharge planning Services  CM Consult  Post Acute Care Choice:    Choice offered to:     DME Arranged:    DME Agency:     HH Arranged:    HH Agency:     Status of Service:  In process, will continue to follow  If discussed at Long Length of Stay Meetings, dates discussed:    Additional Comments:  Dessa Phi, RN 02/14/2018, 11:46 AM

## 2018-02-14 NOTE — Patient Outreach (Signed)
Christy May [facility]) Care Management  02/14/2018  Christy May [birth year] [id number]   Member presented to multidisciplinary team for recommendations for continued care.  Member has received multiple recommendations from her physicians as well as [facility] and several home health agencies regarding the need for increased level of care.  Member continues to refuse. She also continue to deny that she has anyone available to care for her in the home.  Per medical director's suggestion, [facility] will continue to follow telephonically.  Noted that member was readmitted to hospital over the weekend.  Notified by ED case manager that she is still refusing placement, and that [facility] home [facility] will not restart services due to the level of care the member need.  Per ED case manager, new referral will not be placed to home health due to these concerns documented by multiple disciplines and agencies.  Information has been provided to member for [facility].  Hospital liaison notified of admission.  As stated, this care manager will close to community due to inability to progress and refusal to follow recommended plan of care.  Will place referral to telephonic team to follow per MD suggestion.  [name], [place], MSN [place] [phone]

## 2018-02-15 ENCOUNTER — Inpatient Hospital Stay (HOSPITAL_COMMUNITY): Payer: Medicare Other

## 2018-02-15 DIAGNOSIS — M4804 Spinal stenosis, thoracic region: Principal | ICD-10-CM

## 2018-02-15 LAB — GLUCOSE, CAPILLARY
GLUCOSE-CAPILLARY: 262 mg/dL — AB (ref 70–99)
GLUCOSE-CAPILLARY: 288 mg/dL — AB (ref 70–99)
GLUCOSE-CAPILLARY: 292 mg/dL — AB (ref 70–99)
Glucose-Capillary: 367 mg/dL — ABNORMAL HIGH (ref 70–99)
Glucose-Capillary: 260 mg/dL — ABNORMAL HIGH (ref 70–99)

## 2018-02-15 MED ORDER — LIDOCAINE 5 % EX PTCH
1.0000 | MEDICATED_PATCH | CUTANEOUS | Status: DC
  Administered 2018-02-15 – 2018-02-22 (×7): 1 via TRANSDERMAL
  Filled 2018-02-15 (×8): qty 1

## 2018-02-15 MED ORDER — INSULIN ASPART 100 UNIT/ML ~~LOC~~ SOLN
0.0000 [IU] | Freq: Three times a day (TID) | SUBCUTANEOUS | Status: DC
  Administered 2018-02-15: 11 [IU] via SUBCUTANEOUS
  Administered 2018-02-15: 20 [IU] via SUBCUTANEOUS
  Administered 2018-02-16: 7 [IU] via SUBCUTANEOUS
  Administered 2018-02-16: 15 [IU] via SUBCUTANEOUS
  Administered 2018-02-16: 20 [IU] via SUBCUTANEOUS
  Administered 2018-02-17: 15 [IU] via SUBCUTANEOUS
  Administered 2018-02-18: 3 [IU] via SUBCUTANEOUS
  Administered 2018-02-18: 4 [IU] via SUBCUTANEOUS
  Administered 2018-02-18: 15 [IU] via SUBCUTANEOUS
  Administered 2018-02-19: 7 [IU] via SUBCUTANEOUS
  Administered 2018-02-19 (×2): 11 [IU] via SUBCUTANEOUS
  Administered 2018-02-20: 15 [IU] via SUBCUTANEOUS
  Administered 2018-02-20 (×2): 4 [IU] via SUBCUTANEOUS
  Administered 2018-02-21: 7 [IU] via SUBCUTANEOUS
  Administered 2018-02-21: 3 [IU] via SUBCUTANEOUS
  Administered 2018-02-21 – 2018-02-22 (×2): 4 [IU] via SUBCUTANEOUS
  Administered 2018-02-22 (×2): 7 [IU] via SUBCUTANEOUS

## 2018-02-15 NOTE — Progress Notes (Addendum)
Physical Therapy Treatment Patient Details Name: Christy May MRN: [id number] DOB: 22-Dec-1950 Today's Date: 02/15/2018    History of Present Illness Christy May is a 67 y.o. female with medical history significant of diabetes mellitus type 2, essential hypertension, anemia, gout, on chronic steroid  OSA on CPAP, gout, hx of MRSA, essentially bed bound at home.  Presented 02/11/18  with questionable syncopal episode today associated with hypotension    PT Comments    +2 mod/max assist for bed mobility. Pt sat at edge of bed x 17 minutes, worked on sitting balance and BUE/LE exercises. Pt requires min assist to maintain unsupported balance in sitting. HR 119 with activity. Pt reports moderate vertigo with rolling, she had meclizine prior to PT session.  Pt puts forth good effort.    Follow Up Recommendations  SNF;Supervision/Assistance - 24 hour     Equipment Recommendations  None recommended by PT    Recommendations for Other Services       Precautions / Restrictions Precautions Precautions: Fall Precaution Comments: has small areas of open wounds on buttocks. has been bed bound x > 87month. Calls fire dept to get her OOB. h/o multiple falls Restrictions Weight Bearing Restrictions: No    Mobility  Bed Mobility   Bed Mobility: Rolling;Sidelying to Sit;Sit to Supine Rolling: Mod assist;+2 for physical assistance;+2 for safety/equipment Sidelying to sit: +2 for physical assistance;Max assist   Sit to supine: Max assist;+2 for physical assistance   General bed mobility comments: rolls to right with mod assist , patient able  to move the  left leg over right to facilitae rolling, uses rails. complains of moderate dizziness with rolling L and R (pt had meclizine prior to PT session); pt sat on edge of bed x 17 min, worked on sitting balance and BUE/LE exercises  Transfers                 General transfer comment: did not attempt. has been bed bound > 1 month.  will need lift OOB  Ambulation/Gait                 Stairs             Wheelchair Mobility    Modified Rankin (Stroke Patients Only)       Balance Overall balance assessment: Needs assistance Sitting-balance support: Feet supported;Single extremity supported Sitting balance-Leahy Scale: Poor Sitting balance - Comments: pt sat on edge of bed x 17 minutes, feet supported on trash can, required single UE support to maintain balance, with no UE support pt tended to lean R and posteriorly and required min A to correct. Pt maintains head tilted L laterally (? due to prior neck surgery), has loss of balance when she brings head to neutral.                                     Cognition Arousal/Alertness: Awake/alert Behavior During Therapy: WFL for tasks assessed/performed Overall Cognitive Status: Within Functional Limits for tasks assessed                                        Exercises General Exercises - Upper Extremity Shoulder Flexion: AROM;10 reps;Seated General Exercises - Lower Extremity Long Arc Quad: AAROM;Both;10 reps;Seated    General Comments  Pertinent Vitals/Pain Pain Assessment: No/denies pain    Home Living                      Prior Function            PT Goals (current goals can now be found in the care plan section) Acute Rehab PT Goals Patient Stated Goal: to walk PT Goal Formulation: With patient Time For Goal Achievement: 02/26/18 Potential to Achieve Goals: Fair Progress towards PT goals: Progressing toward goals    Frequency    Min 2X/week      PT Plan Current plan remains appropriate    Co-evaluation              AM-PAC PT "6 Clicks" Daily Activity  Outcome Measure  Difficulty turning over in bed (including adjusting bedclothes, sheets and blankets)?: Unable Difficulty moving from lying on back to sitting on the side of the bed? : Unable Difficulty sitting down  on and standing up from a chair with arms (e.g., wheelchair, bedside commode, etc,.)?: Unable Help needed moving to and from a bed to chair (including a wheelchair)?: Total Help needed walking in hospital room?: Total Help needed climbing 3-5 steps with a railing? : Total 6 Click Score: 6    End of Session   Activity Tolerance: Patient tolerated treatment well;Other (comment)(dizziness) Patient left: in bed;with call bell/phone within reach Nurse Communication: Need for lift equipment PT Visit Diagnosis: Other abnormalities of gait and mobility (R26.89);Muscle weakness (generalized) (M62.81);History of falling (Z91.81);Dizziness and giddiness (R42)     Time: 2060-1561 PT Time Calculation (min) (ACUTE ONLY): 36 min  Charges:  $Therapeutic Activity: 23-37 mins                        Blondell Reveal Kistler 02/15/2018, 12:00 PM [phone]

## 2018-02-15 NOTE — Consult Note (Signed)
   [facility] CM Inpatient Consult   02/15/2018  Christy May February 13, 1951 [id number]   Spoke with inpatient RNCM to make aware [place] Management will be following telephonically post hospital discharge. Made aware that Mrs. Belardo likely to discharge to SNF.   Marthenia Rolling, MSN-Ed, RN,BSN [facility] Liaison [phone]

## 2018-02-15 NOTE — Progress Notes (Signed)
PROGRESS NOTE    Christy May  PNT:[id number] DOB: 1951-05-01 DOA: 02/11/2018 PCP: Lucianne Lei, MD   Brief Narrative: Christy May is a 67 y.o. female with medical history significant of diabetes mellitus type 2, essential hypertension, anemia, gout, on chronic steroid  OSA on CPAP, gout, hx of MRSA. She presented secondary to syncopal episode and hypotension. Workup unremarkable. Significant LE weakness secondary to severe T1-2 spinal stenosis.   Assessment & Plan:   Active Problems:   Morbid obesity ([place])   ANEMIA-NOS   Essential hypertension   GERD   Sleep apnea   Gout   Weakness generalized   Obstructive sleep apnea   DM neuropathy, type II diabetes mellitus (HCC)   Dehydration   AKI (acute kidney injury) ([place])   Hypotension   Syncope, vasovagal   Syncope   Syncope Thought to be vasovagal. No arrythmia overnight. Troponin did rise minimally but is continuing to trend upwards. No chest pain. EKG significant for some anterior t-wave changes but is otherwise normal. Transthoracic Echocardiogram unremarkable for dysfunction or valvular stenosis.  Dehydration Likely contributed to syncope. Improved.  Acute kidney injury Baseline of around 1. Peak of 1.76 and has improved to 1.51. Continues to improve with oral hydration. -Encourage further oral intake  Vertigo Patient is symptomatic. She has not used medication -Meclizine prn -PT  Severe T1-2 spinal stenosis Patient being followed by neurosurgery. Plan was for MRI of cervical and thoracic spine, however, patient was in the ED at time of her appointment. PT recommending SNF. Patient has been mostly bed bound acutely secondary to weakness and falls. -Will consult Dr. Arnoldo Morale  Pressure ulcers Buttock. In setting of recent bedbound status. Patient was not turning often.  -Frequent turning  Diabets mellitus with neuropathy On insulin as an outpatient -Continue NPH 120 units -Continue SSI, increase to  severe scale  GERD -Continue Protonix  Gout -Continue allopurinol prednisone  OSA -CPAP qhs  Morbid obesity Body mass index is 53.74 kg/m.   DVT prophylaxis: Lovenox Code Status:   Code Status: Full Code Family Communication: None at bedside Disposition Plan: Discharge to SNF when bed available   Consultants:   None  Procedures:   8/24: Transthoracic Echocardiogram Study Conclusions  - Left ventricle: The cavity size was normal. Wall thickness was   increased in a pattern of mild LVH. Systolic function was normal.   The estimated ejection fraction was in the range of 55% to 60%.   Wall motion was normal; there were no regional wall motion   abnormalities. The study is not technically sufficient to allow   evaluation of LV diastolic function. - Aortic valve: Mildly to moderately calcified annulus. Moderately   thickened leaflets. Mean gradient (S): 8 mm Hg. Valve area (VTI):   1.79 cm^2. Valve area (Vmax): 1.52 cm^2. - Technically difficult study. Echocontrast was used to enhance   visualization.  Antimicrobials:  None    Subjective: No issues.  Objective: Vitals:   02/14/18 0540 02/14/18 1340 02/14/18 2001 02/15/18 0355  BP: (!) 144/120 128/78 (!) 148/99 (!) 144/91  Pulse: 87 (!) 117 (!) 108 97  Resp: 12  18 20   Temp: 97.8 F (36.6 C) 98 F (36.7 C) 97.6 F (36.4 C) 97.9 F (36.6 C)  TempSrc: Oral Oral Oral Oral  SpO2: 96% 92% 94% 98%  Weight:      Height:        Intake/Output Summary (Last 24 hours) at 02/15/2018 1035 Last data filed at 02/15/2018 1029 Gross per  24 hour  Intake 240 ml  Output 2100 ml  Net -1860 ml   Filed Weights   02/11/18 1455  Weight: (!) 142 kg    Examination:  General exam: Appears calm and comfortable Respiratory system: Clear to auscultation. Respiratory effort normal. Cardiovascular system: S1 & S2 heard, RRR. No murmurs, rubs, gallops or clicks. Gastrointestinal system: Abdomen is nondistended, soft and  nontender. No organomegaly or masses felt. Normal bowel sounds heard. Central nervous system: Alert and oriented. No focal neurological deficits. Extremities: LE edema. No calf tenderness Skin: No cyanosis. No rashes Psychiatry: Judgement and insight appear normal. Mood & affect appropriate.   Data Reviewed: I have personally reviewed following labs and imaging studies  CBC: Recent Labs  Lab 02/11/18 1541 02/12/18 0125  WBC 10.4 10.5  NEUTROABS 8.5*  --   HGB 12.0 11.8*  HCT 38.3 37.5  MCV 89.9 90.4  PLT 279 921   Basic Metabolic Panel: Recent Labs  Lab 02/11/18 1541 02/12/18 0125 02/13/18 1431  NA 139 138 139  K 5.0 4.7 4.8  CL 100 101 102  CO2 26 26 26   GLUCOSE 268* 322* 352*  BUN 45* 39* 23  CREATININE 1.76* 1.51* 1.32*  CALCIUM 9.5 9.1 9.5  MG  --  1.8  --   PHOS  --  3.0  --    GFR: Estimated Creatinine Clearance: 57.7 mL/min (A) (by C-G formula based on SCr of 1.32 mg/dL (H)). Liver Function Tests: Recent Labs  Lab 02/11/18 1541 02/12/18 0125  AST 13* 15  ALT 15 18  ALKPHOS 62 61  BILITOT 0.6 0.4  PROT 7.0 6.7  ALBUMIN 3.0* 2.9*   No results for input(s): LIPASE, AMYLASE in the last 168 hours. No results for input(s): AMMONIA in the last 168 hours. Coagulation Profile: No results for input(s): INR, PROTIME in the last 168 hours. Cardiac Enzymes: Recent Labs  Lab 02/11/18 2039 02/12/18 0125 02/12/18 0733 02/12/18 1454  TROPONINI <0.03 0.03* 0.05* 0.04*   BNP (last 3 results) No results for input(s): PROBNP in the last 8760 hours. HbA1C: No results for input(s): HGBA1C in the last 72 hours. CBG: Recent Labs  Lab 02/14/18 1100 02/14/18 1628 02/14/18 2050 02/15/18 0035 02/15/18 0754  GLUCAP 222* 331* 316* 292* 288*   Lipid Profile: No results for input(s): CHOL, HDL, LDLCALC, TRIG, CHOLHDL, LDLDIRECT in the last 72 hours. Thyroid Function Tests: No results for input(s): TSH, T4TOTAL, FREET4, T3FREE, THYROIDAB in the last 72  hours. Anemia Panel: No results for input(s): VITAMINB12, FOLATE, FERRITIN, TIBC, IRON, RETICCTPCT in the last 72 hours. Sepsis Labs: Recent Labs  Lab 02/11/18 1601  LATICACIDVEN 1.41    Recent Results (from the past 240 hour(s))  MRSA PCR Screening     Status: Abnormal   Collection Time: 02/11/18 10:11 PM  Result Value Ref Range Status   MRSA by PCR POSITIVE (A) NEGATIVE Final    Comment:        The GeneXpert MRSA Assay (FDA approved for NASAL specimens only), is one component of a comprehensive MRSA colonization surveillance program. It is not intended to diagnose MRSA infection nor to guide or monitor treatment for MRSA infections. RESULT CALLED TO, READ BACK BY AND VERIFIED WITH: ASARO,J RN AT 1941 02/12/18 BY TIBBITTS,K Performed at [facility], [place] [address] [place], [place] [address]          Radiology Studies: Mr Cervical Spine Wo Contrast  Result Date: 02/14/2018 CLINICAL DATA:  Weakness and multiple falls  EXAM: MRI CERVICAL AND THORACIC SPINE WITHOUT CONTRAST TECHNIQUE: Multiplanar and multiecho pulse sequences of the cervical and thoracic spine were obtained without intravenous contrast. COMPARISON:  None. FINDINGS: MRI CERVICAL SPINE FINDINGS Alignment: Physiologic. Vertebrae: There is ACDF hardware extending from C3-C7. Cord: Normal signal and morphology. Posterior Fossa, vertebral arteries, paraspinal tissues: Negative. Disc levels: C2-C3: Small central disc protrusion and mild bilateral facet hypertrophy. No spinal canal stenosis. Mild right foraminal stenosis. C3-C4: Postfusion changes without stenosis. C4-C5: Postfusion changes without stenosis. C5-C6: Postfusion changes without stenosis. C6-C7: Postfusion changes without stenosis. C7-T1: No spinal canal stenosis. Mild narrowing of the right neural foramen. MRI THORACIC SPINE FINDINGS The due to patient body habitus, axial images were not able to be obtained below the C7-T1 level.  Alignment: Normal Vertebrae: No fracture, evidence of discitis, or bone lesion. Cord: There is focal hyperintense T2-weighted signal within the spinal cord at the T1-T2 level. The remainder of the spinal cord is normal. Paraspinal and other soft tissues: Negative. Disc levels: Assessment of disc spaces is limited due to the lack of axial imaging. There is severe spinal canal stenosis at the T1-T2 level, predominantly due to posterior osseous spurring. There is no other high-grade spinal canal stenosis. There are small disc herniations at T9-10 and T10-11. IMPRESSION: 1. Examination limited by patient body habitus. Axial images of the thoracic spine were not able to be obtained. 2. Severe spinal canal stenosis at T1-T2, predominantly caused by posterior osseous spurring. Signal changes within the spinal cord at this level likely indicate myelomalacia. 3. No high-grade cervical stenosis. Electronically Signed   By: Ulyses Jarred M.D.   On: 02/14/2018 19:47   Mr Thoracic Spine Wo Contrast  Result Date: 02/14/2018 CLINICAL DATA:  Weakness and multiple falls EXAM: MRI CERVICAL AND THORACIC SPINE WITHOUT CONTRAST TECHNIQUE: Multiplanar and multiecho pulse sequences of the cervical and thoracic spine were obtained without intravenous contrast. COMPARISON:  None. FINDINGS: MRI CERVICAL SPINE FINDINGS Alignment: Physiologic. Vertebrae: There is ACDF hardware extending from C3-C7. Cord: Normal signal and morphology. Posterior Fossa, vertebral arteries, paraspinal tissues: Negative. Disc levels: C2-C3: Small central disc protrusion and mild bilateral facet hypertrophy. No spinal canal stenosis. Mild right foraminal stenosis. C3-C4: Postfusion changes without stenosis. C4-C5: Postfusion changes without stenosis. C5-C6: Postfusion changes without stenosis. C6-C7: Postfusion changes without stenosis. C7-T1: No spinal canal stenosis. Mild narrowing of the right neural foramen. MRI THORACIC SPINE FINDINGS The due to patient  body habitus, axial images were not able to be obtained below the C7-T1 level. Alignment: Normal Vertebrae: No fracture, evidence of discitis, or bone lesion. Cord: There is focal hyperintense T2-weighted signal within the spinal cord at the T1-T2 level. The remainder of the spinal cord is normal. Paraspinal and other soft tissues: Negative. Disc levels: Assessment of disc spaces is limited due to the lack of axial imaging. There is severe spinal canal stenosis at the T1-T2 level, predominantly due to posterior osseous spurring. There is no other high-grade spinal canal stenosis. There are small disc herniations at T9-10 and T10-11. IMPRESSION: 1. Examination limited by patient body habitus. Axial images of the thoracic spine were not able to be obtained. 2. Severe spinal canal stenosis at T1-T2, predominantly caused by posterior osseous spurring. Signal changes within the spinal cord at this level likely indicate myelomalacia. 3. No high-grade cervical stenosis. Electronically Signed   By: Ulyses Jarred M.D.   On: 02/14/2018 19:47        Scheduled Meds: . allopurinol  300 mg Oral Daily  .  aspirin EC  81 mg Oral QHS  . Chlorhexidine Gluconate Cloth  6 each Topical Q0600  . cyclobenzaprine  10 mg Oral Daily  . cycloSPORINE  1 drop Both Eyes BID  . enoxaparin (LOVENOX) injection  0.5 mg/kg Subcutaneous Q24H  . folic acid  1 mg Oral Daily  . gabapentin  800 mg Oral TID  . Gerhardt's butt cream   Topical QID  . insulin aspart  0-20 Units Subcutaneous TID WC  . insulin aspart  0-5 Units Subcutaneous QHS  . insulin NPH Human  120 Units Subcutaneous QAC breakfast  . loratadine  10 mg Oral Daily  . montelukast  10 mg Oral QHS  . mupirocin ointment  1 application Nasal BID  . oxybutynin  10 mg Oral Daily  . pantoprazole  40 mg Oral BID  . predniSONE  20 mg Oral Q breakfast   Continuous Infusions:   LOS: 2 days     Cordelia Poche, MD [facility] 02/15/2018, 10:35 AM Pager: [phone]  If 7PM-7AM, please contact night-coverage [email or web address] 02/15/2018, 10:35 AM

## 2018-02-15 NOTE — Progress Notes (Signed)
Pt states she is not ready for cpap yet tonight, and asked that I come back around 1am.  RT will return at that time per pt request.

## 2018-02-15 NOTE — Progress Notes (Signed)
Occupational Therapy Treatment Patient Details Name: Christy May MRN: [id number] DOB: 09/16/1950 Today's Date: 02/15/2018    History of present illness Christy May is a 67 y.o. female with medical history significant of diabetes mellitus type 2, essential hypertension, anemia, gout, on chronic steroid  OSA on CPAP, gout, hx of MRSA, essentially bed bound at home.  Presented 02/11/18  with questionable syncopal episode today associated with hypotension   OT comments  Pt presents supine in bed pleasant and willing to participate in session. Pt participating in bed level UB/LB A/AA/PROM exercises to bil UEs and LEs for increasing strength/endurance as precursor for EOB/OOB activity. Pt is motivated to work with therapy and regain her independence. Feel POC remains appropriate at this time. Will continue to follow acutely to progress pt towards established OT goals.   Follow Up Recommendations  SNF    Equipment Recommendations  None recommended by OT          Precautions / Restrictions Precautions Precautions: Fall Precaution Comments: has small areas of open wounds on buttocks. has been bed bound x > 33month. Calls fire dept to get her OOB. h/o multiple falls Restrictions Weight Bearing Restrictions: No                                      General transfer comment: did not attempt. has been bed bound > 1 month. will need lift OOB                                           ADL either performed or assessed with clinical judgement   ADL Overall ADL's : Needs assistance/impaired                                       General ADL Comments: pt motivated to work with therapy this session; completed bed level UB/LB A/P/AAROM and stretching for increasing strength and endurance                        Cognition Arousal/Alertness: Awake/alert Behavior During Therapy: WFL for tasks assessed/performed Overall  Cognitive Status: Within Functional Limits for tasks assessed                                          Exercises General Exercises - Upper Extremity Shoulder Flexion: AROM;10 reps;Supine;Both Shoulder Horizontal ABduction: AROM;10 reps;Both;Supine Shoulder Horizontal ADduction: AROM;Both;10 reps;Supine Elbow Flexion: AROM;10 reps;Both;Supine Elbow Extension: AROM;Both;10 reps;Supine General Exercises - Lower Extremity Ankle Circles/Pumps: AAROM;PROM;10 reps;Both;Supine Long Arc Quad: AAROM;Both;10 reps;Seated Other Exercises Other Exercises: AA/PROM knee flexion/extension bil LEs while supine in bed, x10 reps    Shoulder Instructions       General Comments      Pertinent Vitals/ Pain       Pain Assessment: Faces Faces Pain Scale: Hurts a little bit Pain Location: low back  Pain Descriptors / Indicators: Discomfort Pain Intervention(s): Monitored during session  Home Living  Prior Functioning/Environment              Frequency  Min 2X/week        Progress Toward Goals  OT Goals(current goals can now be found in the care plan section)  Progress towards OT goals: Progressing toward goals  Acute Rehab OT Goals Patient Stated Goal: to walk OT Goal Formulation: With patient Time For Goal Achievement: 02/26/18 Potential to Achieve Goals: [place] Discharge plan remains appropriate    Co-evaluation                 AM-PAC PT "6 Clicks" Daily Activity     Outcome Measure   Help from another person eating meals?: None Help from another person taking care of personal grooming?: A Little Help from another person toileting, which includes using toliet, bedpan, or urinal?: A Lot Help from another person bathing (including washing, rinsing, drying)?: A Lot Help from another person to put on and taking off regular upper body clothing?: A Little Help from another person to put on and  taking off regular lower body clothing?: Total 6 Click Score: 15    End of Session    OT Visit Diagnosis: Muscle weakness (generalized) (M62.81)   Activity Tolerance Patient tolerated treatment well   Patient Left in bed;with call bell/phone within reach   Nurse Communication Mobility status        Time: 1202-1223 OT Time Calculation (min): 21 min  Charges: OT General Charges $OT Visit: 1 Visit OT Treatments $Therapeutic Activity: 8-22 mins  Lou Cal, OT Pager [phone] 02/15/2018    Raymondo Band 02/15/2018, 1:29 PM

## 2018-02-16 ENCOUNTER — Other Ambulatory Visit: Payer: Self-pay | Admitting: Neurosurgery

## 2018-02-16 LAB — CBC
HCT: 36.3 % (ref 36.0–46.0)
Hemoglobin: 11.5 g/dL — ABNORMAL LOW (ref 12.0–15.0)
MCH: 28.3 pg (ref 26.0–34.0)
MCHC: 31.7 g/dL (ref 30.0–36.0)
MCV: 89.4 fL (ref 78.0–100.0)
PLATELETS: 227 10*3/uL (ref 150–400)
RBC: 4.06 MIL/uL (ref 3.87–5.11)
RDW: 15.2 % (ref 11.5–15.5)
WBC: 9.9 10*3/uL (ref 4.0–10.5)

## 2018-02-16 LAB — GLUCOSE, CAPILLARY
GLUCOSE-CAPILLARY: 209 mg/dL — AB (ref 70–99)
GLUCOSE-CAPILLARY: 345 mg/dL — AB (ref 70–99)
GLUCOSE-CAPILLARY: 382 mg/dL — AB (ref 70–99)
GLUCOSE-CAPILLARY: 318 mg/dL — AB (ref 70–99)

## 2018-02-16 LAB — BASIC METABOLIC PANEL
ANION GAP: 10 (ref 5–15)
BUN: 26 mg/dL — AB (ref 8–23)
CO2: 28 mmol/L (ref 22–32)
Calcium: 9.6 mg/dL (ref 8.9–10.3)
Chloride: 101 mmol/L (ref 98–111)
Creatinine, Ser: 1.1 mg/dL — ABNORMAL HIGH (ref 0.44–1.00)
GFR calc Af Amer: 58 mL/min — ABNORMAL LOW (ref >60)
GFR, EST NON AFRICAN AMERICAN: 50 mL/min — AB (ref >60)
GLUCOSE: 240 mg/dL — AB (ref 70–99)
POTASSIUM: 4.5 mmol/L (ref 3.5–5.1)
Sodium: 139 mmol/L (ref 135–145)

## 2018-02-16 MED ORDER — IBUPROFEN 800 MG PO TABS: 800.0000 mg | ORAL_TABLET | Freq: Three times a day (TID) | ORAL | Status: DC | PRN

## 2018-02-16 MED ORDER — INSULIN NPH (HUMAN) (ISOPHANE) 100 UNIT/ML ~~LOC~~ SUSP
70.0000 [IU] | Freq: Two times a day (BID) | SUBCUTANEOUS | Status: DC
  Administered 2018-02-16 – 2018-02-17 (×3): 70 [IU] via SUBCUTANEOUS
  Filled 2018-02-16 (×2): qty 10

## 2018-02-16 MED ORDER — CHLORHEXIDINE GLUCONATE CLOTH 2 % EX PADS
6.0000 | MEDICATED_PAD | Freq: Once | CUTANEOUS | Status: AC
  Administered 2018-02-17: 6 via TOPICAL

## 2018-02-16 MED ORDER — HYDROCODONE-ACETAMINOPHEN 5-325 MG PO TABS
2.0000 | ORAL_TABLET | Freq: Three times a day (TID) | ORAL | Status: DC
  Administered 2018-02-16 – 2018-02-18 (×5): 2 via ORAL
  Administered 2018-02-18: 1 via ORAL
  Administered 2018-02-19 – 2018-02-22 (×12): 2 via ORAL
  Filled 2018-02-16 (×20): qty 2

## 2018-02-16 MED ORDER — CHLORHEXIDINE GLUCONATE CLOTH 2 % EX PADS
6.0000 | MEDICATED_PAD | Freq: Once | CUTANEOUS | Status: AC
  Administered 2018-02-16: 6 via TOPICAL

## 2018-02-16 MED ORDER — METHYLPREDNISOLONE SODIUM SUCC 40 MG IJ SOLR
40.0000 mg | Freq: Two times a day (BID) | INTRAMUSCULAR | Status: DC
  Administered 2018-02-16 – 2018-02-18 (×6): 40 mg via INTRAVENOUS
  Filled 2018-02-16 (×7): qty 1

## 2018-02-16 MED ORDER — FLUTICASONE PROPIONATE 50 MCG/ACT NA SUSP
2.0000 | Freq: Every day | NASAL | Status: DC
  Administered 2018-02-18 – 2018-02-22 (×5): 2 via NASAL
  Filled 2018-02-16 (×3): qty 16

## 2018-02-16 MED ORDER — VANCOMYCIN HCL IN DEXTROSE 1-5 GM/200ML-% IV SOLN
1000.0000 mg | INTRAVENOUS | Status: AC
  Administered 2018-02-17: 1000 mg via INTRAVENOUS
  Filled 2018-02-16: qty 200

## 2018-02-16 MED ORDER — METOPROLOL TARTRATE 50 MG PO TABS: 75.0000 mg | ORAL_TABLET | Freq: Two times a day (BID) | ORAL | Status: DC

## 2018-02-16 MED ORDER — TRAMADOL HCL 50 MG PO TABS: 100.0000 mg | ORAL_TABLET | Freq: Two times a day (BID) | ORAL | Status: DC | PRN

## 2018-02-16 MED ORDER — METOPROLOL TARTRATE 50 MG PO TABS
75.0000 mg | ORAL_TABLET | Freq: Two times a day (BID) | ORAL | Status: DC
  Administered 2018-02-16 – 2018-02-22 (×13): 75 mg via ORAL
  Filled 2018-02-16 (×14): qty 1

## 2018-02-16 MED ORDER — IPRATROPIUM BROMIDE 0.06 % NA SOLN
2.0000 | Freq: Two times a day (BID) | NASAL | Status: DC
  Administered 2018-02-17 – 2018-02-22 (×8): 2 via NASAL
  Filled 2018-02-16: qty 30
  Filled 2018-02-16: qty 15
  Filled 2018-02-16: qty 30

## 2018-02-16 MED ORDER — TETRAHYDROZOLINE HCL 0.05 % OP SOLN
1.0000 [drp] | Freq: Two times a day (BID) | OPHTHALMIC | Status: DC
  Administered 2018-02-16 – 2018-02-22 (×11): 1 [drp] via OPHTHALMIC
  Filled 2018-02-16 (×2): qty 15

## 2018-02-16 NOTE — Consult Note (Signed)
Reason for Consult: Thoracic stenosis, paraparesis Referring Physician: Dr. Lyn Hollingshead is an 67 y.o. female.  HPI: The patient is a 67 year old black female known to me on whom I performed a C3-C7 anterior cervical discectomy, fusion and plating many years ago.  I had not seen her in years until a few weeks ago when she presented to the office wheelchair-bound with paraparesis.  She tells me that over the last 6 months or so she has taken greater than 20 falls and has become progressively weak.  She has been wheelchair-bound for about 2 months.  She had a lumbar MRI at that time which demonstrated some mild narrowing.  I recommended further work-up with a cervical and thoracic MRI.  The patient was in the process of getting these MRIs when she called EMS because of bedsores.  Evidently while they were evaluating her she had a syncopal episode.  She was admitted to [facility] with dehydration and other medical problems.  A cervical and partial thoracic MRI was obtained.  The original thoracic MRI did not occlude axillary images through the narrowed area at T1-2.  She underwent a repeat thoracic MRI with axial views which demonstrated significant thoracic stenosis at T1-2.  I was consulted.  Presently the patient is alert and pleasant.  She complains that she cannot walk.  As above she has been wheelchair-bound for about 2 months.  Evidently the weakness began in January 2019.  Past Medical History:  Diagnosis Date  . Allergy   . Anemia, unspecified   . Anxiety   . Arthritis    "neck, back, hands" (09/03/2014)  . Chronic airway obstruction, not elsewhere classified    "I was told I don't have this/tests done @ [facility] 05/2014"  . Chronic back pain   . Colon polyps   . Complication of anesthesia    "I've had recall; I probably stopped breathing at least 2 times" (09/03/2014)  . Dehydration   . Depressive disorder, not elsewhere classified   . Diabetic peripheral  neuropathy ([place])   . Dysphagia 2007   historyof dysphagia with severe dysmotility by barium swallow-[name]  . Esophageal reflux   . Family history of adverse reaction to anesthesia    "my brother was told they lost him a couple times during OR"  . Heart murmur   . History of hiatal hernia   . Migraine    "couple times/month right now" (09/03/2014)  . Obesity, unspecified   . OSA (obstructive sleep apnea)     CPAP  . Septic shock ([place]) 08/30/2017  . Spondylosis of unspecified site without mention of myelopathy   . Type II diabetes mellitus ([place])   . Unspecified essential hypertension   . Unspecified menopausal and postmenopausal disorder   . Unspecified vitamin D deficiency   . Upper airway cough syndrome     Past Surgical History:  Procedure Laterality Date  . ABDOMINAL HYSTERECTOMY     "partial"  . ANTERIOR CERVICAL DECOMP/DISCECTOMY FUSION     multiple cervical spine levels;Dr. Arnoldo Morale  . APPENDECTOMY    . BACK SURGERY    . CHOLECYSTECTOMY OPEN    . COLONOSCOPY N/A 05/08/2014   Procedure: COLONOSCOPY;  Surgeon: [name], MD;  Location: [facility];  Service: Endoscopy;  Laterality: N/A;  . DILATION AND CURETTAGE OF UTERUS    . ESOPHAGOGASTRODUODENOSCOPY N/A 05/08/2014   Procedure: ESOPHAGOGASTRODUODENOSCOPY (EGD);  Surgeon: [name], MD;  Location: [name];  Service: Endoscopy;  Laterality: N/A;  .  EXPLORATORY LAPAROTOMY    . EYE SURGERY    . FOOT SURGERY Right X 2   "took blood out of my arms and put platelets in my feet"  . INCISION AND DRAINAGE ABSCESS     Chest  . JOINT REPLACEMENT    . REFRACTIVE SURGERY Bilateral   . TONSILLECTOMY    . TOTAL KNEE ARTHROPLASTY Left 10/2003  . TOTAL KNEE ARTHROPLASTY Right 01/2004   [name]    Family History  Problem Relation Age of Onset  . Esophageal cancer Brother   . Esophageal cancer Sister        ?  . Colon polyps Brother   . Pancreatic cancer Sister        ?  . Diabetes Sister   . Diabetes  Unknown        Aunt and Uncle  . Heart disease Maternal Grandfather     Social History:  reports that she quit smoking about 31 years ago. Her smoking use included cigarettes. She has a 20.00 pack-year smoking history. She has never used smokeless tobacco. She reports that she drinks alcohol. She reports that she does not use drugs.  Allergies:  Allergies  Allergen Reactions  . Ace Inhibitors Anaphylaxis and Cough    Tolerates Irbesartan (home med)  . Penicillins Hives and Other (See Comments)    Tolerated ceftriaxone in 2019  Has patient had a PCN reaction causing immediate rash, facial/tongue/throat swelling, SOB or lightheadedness with hypotension: Yes Has patient had a PCN reaction causing severe rash involving mucus membranes or skin necrosis:  No Has patient had a PCN reaction that required hospitalization: No Has patient had a PCN reaction occurring within the last 10 years: No If all of the above answers are "NO", then may proceed with Cephalosporin use.  . Adhesive [Tape] Hives and Rash    Medications:  I have reviewed the patient's current medications. Prior to Admission:  Medications Prior to Admission  Medication Sig Dispense Refill Last Dose  . allopurinol (ZYLOPRIM) 300 MG tablet Take 300 mg by mouth daily.   02/11/2018 at Unknown time  . aspirin EC 81 MG tablet Take 81 mg by mouth at bedtime.   02/10/2018 at Unknown time  . Cholecalciferol (VITAMIN D-3) 1000 units CAPS Take 2,000 Units by mouth daily.   02/10/2018 at Unknown time  . cyclobenzaprine (FLEXERIL) 10 MG tablet Take 10 mg by mouth daily.  0 02/10/2018 at Unknown time  . cycloSPORINE (RESTASIS) 0.05 % ophthalmic emulsion Place 1 drop into both eyes 2 (two) times daily.    Past Week at Unknown time  . fluticasone (FLONASE) 50 MCG/ACT nasal spray SHAKE LIQUID AND USE 2 SPRAYS IN EACH NOSTRIL DAILY (Patient taking differently: Place 2 sprays into both nostrils daily. ) 16 g 5 02/10/2018 at Unknown time  . folic acid  (FOLVITE) 1 MG tablet Take 1 mg by mouth once a day   02/11/2018 at Unknown time  . gabapentin (NEURONTIN) 800 MG tablet Take 800 mg by mouth 3 (three) times daily.   02/11/2018 at Unknown time  . HYDROcodone-acetaminophen (NORCO/VICODIN) 5-325 MG tablet Take 1 tablet by mouth every 6 (six) hours as needed. 6 tablet 0 02/10/2018 at Unknown time  . insulin NPH Human (NOVOLIN N RELION) 100 UNIT/ML injection Inject 1.2 mLs (120 Units total) into the skin every morning. 40 mL 11 02/10/2018 at Unknown time  . ipratropium (ATROVENT) 0.03 % nasal spray USE 2 SPRAYS IN EACH NOSTRIL THREE TIMES DAILY AS NEEDED  FOR RHINITIS (Patient taking differently: Place 2 sprays into both nostrils 2 (two) times daily. ) 30 mL 5 02/10/2018 at Unknown time  . irbesartan (AVAPRO) 300 MG tablet Take 300 mg by mouth daily.   02/11/2018 at Unknown time  . loratadine (CLARITIN) 10 MG tablet Take 10 mg by mouth daily.   02/10/2018 at Unknown time  . meclizine (ANTIVERT) 25 MG tablet Take 1 tablet (25 mg total) by mouth 3 (three) times daily as needed for dizziness. 30 tablet 0 02/11/2018 at Unknown time  . metFORMIN (GLUCOPHAGE) 500 MG tablet Take 1 tablet (500 mg total) by mouth 2 (two) times daily with a meal. 60 tablet 0 02/11/2018 at Unknown time  . metoprolol (LOPRESSOR) 50 MG tablet Take 75 mg by mouth 2 (two) times daily.   02/11/2018 at 1100 am  . montelukast (SINGULAIR) 10 MG tablet TAKE 1 TABLET(10 MG) BY MOUTH DAILY (Patient taking differently: Take 10 mg by mouth at bedtime. ) 90 tablet 1 02/10/2018 at Unknown time  . Multiple Vitamins-Minerals (MULTIVITAMIN WITH MINERALS) tablet Take 1 tablet by mouth daily.   Past Month at Unknown time  . nystatin cream (MYCOSTATIN) Apply 1 application topically 2 (two) times daily as needed for dry skin (skin irritation).   02/11/2018 at Unknown time  . ondansetron (ZOFRAN ODT) 8 MG disintegrating tablet Take 1 tablet (8 mg total) by mouth every 8 (eight) hours as needed for nausea or vomiting.  12 tablet 0 02/10/2018 at Unknown time  . oxybutynin (DITROPAN-XL) 10 MG 24 hr tablet Take 1 tablet (10 mg total) by mouth daily. 90 tablet 1 02/10/2018 at Unknown time  . pantoprazole (PROTONIX) 40 MG tablet TAKE 1 TABLET(40 MG) BY MOUTH TWICE DAILY (Patient taking differently: Take 40 mg by mouth 2 (two) times daily. ) 60 tablet 2 02/11/2018 at Unknown time  . predniSONE (DELTASONE) 10 MG tablet Take 2 tablets (20 mg total) by mouth daily with breakfast.   02/10/2018 at Unknown time  . ranitidine (ZANTAC) 300 MG tablet Take 1 tablet (300 mg total) by mouth 2 (two) times daily. 60 tablet 0 02/10/2018 at Unknown time  . sodium chloride (OCEAN) 0.65 % SOLN nasal spray Place 2 sprays into both nostrils 2 (two) times daily.   02/10/2018 at Unknown time  . Tetrahydrozoline HCl (VISINE OP) Apply to eye.   02/10/2018 at Unknown time  . traMADol (ULTRAM) 50 MG tablet Take 50 mg by mouth every 12 (twelve) hours as needed for moderate pain.   Past Week at Unknown time  . TURMERIC PO Take 1 tablet by mouth daily.   02/10/2018 at Unknown time  . Insulin Syringe-Needle U-100 (INSULIN SYRINGE 1CC/31GX5/16") 31G X 5/16" 1 ML MISC USE TO INJECT INSULIN TWICE DAILY 100 each 0 Taking  . ONE TOUCH LANCETS MISC Use to check sugars twice daily DX E11.22 200 each 3 Taking  . predniSONE (DELTASONE) 20 MG tablet TAKE 1 TABLET(20 MG) BY MOUTH DAILY WITH BREAKFAST (Patient not taking: Reported on 02/11/2018) 30 tablet 2 Not Taking at Unknown time   Scheduled: . allopurinol  300 mg Oral Daily  . aspirin EC  81 mg Oral QHS  . Chlorhexidine Gluconate Cloth  6 each Topical Q0600  . cyclobenzaprine  10 mg Oral Daily  . cycloSPORINE  1 drop Both Eyes BID  . enoxaparin (LOVENOX) injection  0.5 mg/kg Subcutaneous Q24H  . fluticasone  2 spray Each Nare Daily  . folic acid  1 mg Oral Daily  . gabapentin  800 mg Oral TID  . Gerhardt's butt cream   Topical QID  . HYDROcodone-acetaminophen  2 tablet Oral TID  . insulin aspart  0-20 Units  Subcutaneous TID WC  . insulin aspart  0-5 Units Subcutaneous QHS  . insulin NPH Human  70 Units Subcutaneous BID AC & HS  . ipratropium  2 spray Each Nare BID  . lidocaine  1 patch Transdermal Q24H  . loratadine  10 mg Oral Daily  . methylPREDNISolone (SOLU-MEDROL) injection  40 mg Intravenous Q12H  . metoprolol tartrate  75 mg Oral BID  . montelukast  10 mg Oral QHS  . mupirocin ointment  1 application Nasal BID  . oxybutynin  10 mg Oral Daily  . pantoprazole  40 mg Oral BID  . tetrahydrozoline  1 drop Both Eyes BID   Continuous:  XEN:[id number] **OR** acetaminophen, albuterol, meclizine, ondansetron **OR** ondansetron (ZOFRAN) IV Anti-infectives (From admission, onward)   None       Results for orders placed or performed during the hospital encounter of 02/11/18 (from the past 48 hour(s))  Glucose, capillary     Status: Abnormal   Collection Time: 02/14/18  4:28 PM  Result Value Ref Range   Glucose-Capillary 331 (H) 70 - 99 mg/dL  Glucose, capillary     Status: Abnormal   Collection Time: 02/14/18  8:50 PM  Result Value Ref Range   Glucose-Capillary 316 (H) 70 - 99 mg/dL  Glucose, capillary     Status: Abnormal   Collection Time: 02/15/18 12:35 AM  Result Value Ref Range   Glucose-Capillary 292 (H) 70 - 99 mg/dL  Glucose, capillary     Status: Abnormal   Collection Time: 02/15/18  7:54 AM  Result Value Ref Range   Glucose-Capillary 288 (H) 70 - 99 mg/dL  Glucose, capillary     Status: Abnormal   Collection Time: 02/15/18 11:31 AM  Result Value Ref Range   Glucose-Capillary 262 (H) 70 - 99 mg/dL  Glucose, capillary     Status: Abnormal   Collection Time: 02/15/18  4:15 PM  Result Value Ref Range   Glucose-Capillary 367 (H) 70 - 99 mg/dL  Glucose, capillary     Status: Abnormal   Collection Time: 02/15/18 10:50 PM  Result Value Ref Range   Glucose-Capillary 260 (H) 70 - 99 mg/dL  CBC     Status: Abnormal   Collection Time: 02/16/18  5:38 AM  Result Value Ref  Range   WBC 9.9 4.0 - 10.5 K/uL   RBC 4.06 3.87 - 5.11 MIL/uL   Hemoglobin 11.5 (L) 12.0 - 15.0 g/dL   HCT 36.3 36.0 - 46.0 %   MCV 89.4 78.0 - 100.0 fL   MCH 28.3 26.0 - 34.0 pg   MCHC 31.7 30.0 - 36.0 g/dL   RDW 15.2 11.5 - 15.5 %   Platelets 227 150 - 400 K/uL    Comment: Performed at [facility], [place] [address] [place], [place] [address]  Basic metabolic panel     Status: Abnormal   Collection Time: 02/16/18  5:38 AM  Result Value Ref Range   Sodium 139 135 - 145 mmol/L   Potassium 4.5 3.5 - 5.1 mmol/L   Chloride 101 98 - 111 mmol/L   CO2 28 22 - 32 mmol/L   Glucose, Bld 240 (H) 70 - 99 mg/dL   BUN 26 (H) 8 - 23 mg/dL   Creatinine, Ser 1.10 (H) 0.44 - 1.00 mg/dL   Calcium 9.6 8.9 -  10.3 mg/dL   GFR calc non Af Amer 50 (L) >60 mL/min   GFR calc Af Amer 58 (L) >60 mL/min    Comment: (NOTE) The eGFR has been calculated using the CKD EPI equation. This calculation has not been validated in all clinical situations. eGFR's persistently <60 mL/min signify possible Chronic Kidney Disease.    Anion gap 10 5 - 15    Comment: Performed at [facility], [place] [address], [place], [place] [address]  Glucose, capillary     Status: Abnormal   Collection Time: 02/16/18  7:33 AM  Result Value Ref Range   Glucose-Capillary 209 (H) 70 - 99 mg/dL  Glucose, capillary     Status: Abnormal   Collection Time: 02/16/18 12:10 PM  Result Value Ref Range   Glucose-Capillary 345 (H) 70 - 99 mg/dL   *Note: Due to a large number of results and/or encounters for the requested time period, some results have not been displayed. A complete set of results can be found in Results Review.    Mr Cervical Spine Wo Contrast  Result Date: 02/14/2018 CLINICAL DATA:  Weakness and multiple falls EXAM: MRI CERVICAL AND THORACIC SPINE WITHOUT CONTRAST TECHNIQUE: Multiplanar and multiecho pulse sequences of the cervical and thoracic spine were obtained without intravenous  contrast. COMPARISON:  None. FINDINGS: MRI CERVICAL SPINE FINDINGS Alignment: Physiologic. Vertebrae: There is ACDF hardware extending from C3-C7. Cord: Normal signal and morphology. Posterior Fossa, vertebral arteries, paraspinal tissues: Negative. Disc levels: C2-C3: Small central disc protrusion and mild bilateral facet hypertrophy. No spinal canal stenosis. Mild right foraminal stenosis. C3-C4: Postfusion changes without stenosis. C4-C5: Postfusion changes without stenosis. C5-C6: Postfusion changes without stenosis. C6-C7: Postfusion changes without stenosis. C7-T1: No spinal canal stenosis. Mild narrowing of the right neural foramen. MRI THORACIC SPINE FINDINGS The due to patient body habitus, axial images were not able to be obtained below the C7-T1 level. Alignment: Normal Vertebrae: No fracture, evidence of discitis, or bone lesion. Cord: There is focal hyperintense T2-weighted signal within the spinal cord at the T1-T2 level. The remainder of the spinal cord is normal. Paraspinal and other soft tissues: Negative. Disc levels: Assessment of disc spaces is limited due to the lack of axial imaging. There is severe spinal canal stenosis at the T1-T2 level, predominantly due to posterior osseous spurring. There is no other high-grade spinal canal stenosis. There are small disc herniations at T9-10 and T10-11. IMPRESSION: 1. Examination limited by patient body habitus. Axial images of the thoracic spine were not able to be obtained. 2. Severe spinal canal stenosis at T1-T2, predominantly caused by posterior osseous spurring. Signal changes within the spinal cord at this level likely indicate myelomalacia. 3. No high-grade cervical stenosis. Electronically Signed   By: Ulyses Jarred M.D.   On: 02/14/2018 19:47   Mr Thoracic Spine Wo Contrast  Result Date: 02/15/2018 CLINICAL DATA:  Follow-up upper thoracic spinal stenosis. History of ACDF. EXAM: MRI THORACIC SPINE WITHOUT CONTRAST TECHNIQUE: Axial,  multisequence MR imaging of the thoracic spine was performed. No intravenous contrast was administered. COMPARISON:  MRI cervical and thoracic spine February 14, 2018 FINDINGS: Axial sequences through upper thoracic spine. Severe canal stenosis T1-2, AP dimension of the canal is 5 mm, low signal RIGHT posterolateral epidural space most compatible with calcified ligamentum flavum. Cervical spinal cord deformity without definite edema. No syrinx. Mild canal stenosis T3-4. Nondiagnostic assessment of neural foraminal narrowing on axial sequences. IMPRESSION: 1. Limited axial series through upper thoracic spine. 2. Severe canal stenosis  at T1-2 with cord compression without definite cord edema. Electronically Signed   By: Elon Alas M.D.   On: 02/15/2018 18:54   Mr Thoracic Spine Wo Contrast  Result Date: 02/14/2018 CLINICAL DATA:  Weakness and multiple falls EXAM: MRI CERVICAL AND THORACIC SPINE WITHOUT CONTRAST TECHNIQUE: Multiplanar and multiecho pulse sequences of the cervical and thoracic spine were obtained without intravenous contrast. COMPARISON:  None. FINDINGS: MRI CERVICAL SPINE FINDINGS Alignment: Physiologic. Vertebrae: There is ACDF hardware extending from C3-C7. Cord: Normal signal and morphology. Posterior Fossa, vertebral arteries, paraspinal tissues: Negative. Disc levels: C2-C3: Small central disc protrusion and mild bilateral facet hypertrophy. No spinal canal stenosis. Mild right foraminal stenosis. C3-C4: Postfusion changes without stenosis. C4-C5: Postfusion changes without stenosis. C5-C6: Postfusion changes without stenosis. C6-C7: Postfusion changes without stenosis. C7-T1: No spinal canal stenosis. Mild narrowing of the right neural foramen. MRI THORACIC SPINE FINDINGS The due to patient body habitus, axial images were not able to be obtained below the C7-T1 level. Alignment: Normal Vertebrae: No fracture, evidence of discitis, or bone lesion. Cord: There is focal hyperintense  T2-weighted signal within the spinal cord at the T1-T2 level. The remainder of the spinal cord is normal. Paraspinal and other soft tissues: Negative. Disc levels: Assessment of disc spaces is limited due to the lack of axial imaging. There is severe spinal canal stenosis at the T1-T2 level, predominantly due to posterior osseous spurring. There is no other high-grade spinal canal stenosis. There are small disc herniations at T9-10 and T10-11. IMPRESSION: 1. Examination limited by patient body habitus. Axial images of the thoracic spine were not able to be obtained. 2. Severe spinal canal stenosis at T1-T2, predominantly caused by posterior osseous spurring. Signal changes within the spinal cord at this level likely indicate myelomalacia. 3. No high-grade cervical stenosis. Electronically Signed   By: Ulyses Jarred M.D.   On: 02/14/2018 19:47    ROS: As above Blood pressure 140/69, pulse (!) 110, temperature 99.5 F (37.5 C), temperature source Oral, resp. rate 20, height _0  (1.626 m), weight (!) 142 kg, SpO2 96 %. Estimated body mass index is 53.74 kg/m as calculated from the following:   Height as of this encounter: _1  (1.626 m).   Weight as of this encounter: 142 kg.  Physical Exam  General: An obese densely paraparetic 67 year old black female in no apparent distress.  HEENT: Normocephalic, atraumatic, extraocular muscles intact  Neck: Obese without obvious deformities.  She has limited range of motion.  Her anterior cervical incision is well-healed.  Thorax: Symmetric  Abdomen: Obese  Extremities: Obese  Neurologic exam: Patient is alert and oriented x3.  Cranial nerves II through XII were examined bilaterally and grossly normal.  The patient's motor strength is fairly normal at 4+/5 in her bilateral deltoid, bicep, tricep and hand grip.  The patient has approximately 3/5 left quadricep, gastrocnemius and dorsiflexors strength.  She has 2/5 left iliopsoas strength.  The patient's  right iliopsoas, quadricep and dorsiflexors have 0/5 strength.  Her right extremity strength is 1-2 over 5.  She has decreased sensation in her lower extremities.  I reviewed the patient's cervical MRI performed at [facility] yesterday.  She has a good decompression at C3-4, C4-5, C5-6 and C6-7.  There is some metal artifact.  I reviewed the patient's thoracic MRI performed yesterday at [facility].  She has severe stenosis at T1-2 secondary to right flavum hypertrophy.  The rest of her thoracic spine is fairly unremarkable.  Assessment/Plan: Thoracic  stenosis, dense paraparesis: I have discussed the situation with the patient.  We have discussed the various treatment options including doing nothing given her medical comorbidities versus surgery.  I described a T1-2 laminectomy to decompress her spinal cord.  We have discussed the risks of surgery including risk of anesthesia, hemorrhage, infection, spinal fluid leak, spinal cord injury, paralysis, ventilator dependence, failure to improve her symptoms etc. I have explained that I cannot "fix her spinal cord" but hopefully by decompressing she will need some of her strength.  She understands she is not a great surgical candidate because of all her medical issues, obesity, etc.  I have answered all her questions.  She has decided to proceed with surgery.  I have spoken with Dr. Verlon Au about the surgery if he feels she is medically fit, and transfer to [facility].  We will tentatively plan to do the surgery tomorrow afternoon.  Ophelia Charter 02/16/2018, 12:38 PM

## 2018-02-16 NOTE — Care Management Important Message (Signed)
Important Message  Patient Details  Name: Christy May MRN: [id number] Date of Birth: 1950/08/09   Medicare Important Message Given:  Yes    Kerin Salen 02/16/2018, 2:10 [facility] Message  Patient Details  Name: Christy May MRN: [id number] Date of Birth: [birth year]   Medicare Important Message Given:  Yes    Kerin Salen 02/16/2018, 2:10 PM

## 2018-02-16 NOTE — Progress Notes (Signed)
[facility] PROGRESS NOTE  Christy May LPF:[id number] DOB: [birth year] DOA: 02/11/2018 PCP: Lucianne Lei, MD   Narrative: 82 Fem Body mass index is 53.74 kg/m.  DM TY 2 on oral meds, HTN, anemia, gout, COPD on chronic steroids airway syndrome under Dr. Lamonte Sakai for the past year, OSA on CPAP, reflux, ch kidney disease stage III, urge incontinence, mostly wheelchair-bound and has home health PT in the past  Noted in June 2019 was admitted with low back pain right leg weakness MRI at that time showed lumbar stenosis at multiple levels and she followed up with Dr. Arnoldo Morale  She also had a prior admission earlier in June with sepsis and renal failure   A & Plan Severe lumbar back pain-MRI is done-defer to Dr. Arnoldo Morale planning regarding possible surgery-she is wheelchair-bound at baseline-I have added Solu-Medrol to her meds for the inflammatory component to her pain-she tells me she has had bleeding in the past on NSAIDs and is unable to take them-continue pain management with scheduled opiates 3 times daily  Mostly wheelchair-bound-care management mentions APS worker is involved in the case-patient unable to manage at home and will need skilled care  Dizziness continue meclizine-have ordered PT vestibular eval  BMI >50-severely life limiting-has not lost weight since June and unable to do so  Upper airway syndrome-needs outpatient follow-up with Dr. 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 Arnoldo Morale, not ready for discharge-getting therapy to see for vestibular rehab continuing meclizine monitor   Verlon Au, MD  [facility] Direct contact: [phone] --Via amion app OR  --[email or web address]; password [email or web address]  7PM-7AM contact night coverage as above 02/16/2018, 11:11 AM  LOS: 3 days   Consultants:  Neurosurgery  Procedures:  Multiple  Antimicrobials:  None  Interval history/Subjective: States an 8/10 pain in the back with some radiating pain down her right leg although the distribution is where it-she states her whole leg feels numb  Objective:  Vitals:  Vitals:   02/16/18 0142 02/16/18 0641  BP:  140/69  Pulse: (!) 104 (!) 110  Resp: 18 20  Temp:  99.5 F (37.5 C)  SpO2: 93% 96%    Exam:  . Awake obese pleasant no JVD does not appear to be in pain . EOMI NCAT . Thick neck . S1-S2 no murmur . Abdomen soft no rebound no guarding . Lower extremities are swollen she has bilateral knee surgery scars her distribution of numbness is very strange as she states "whole leg is numb fine touch overall is equivocal-she is unable to raise the right leg up off the bed but passive range of motion is not painful in addition her dorsi and plantarflexion on the right side show give way weakness and she still has 5/5 power despite claiming to be weak   I have personally reviewed the following:   Labs:  BUN/creatinine is 26/1.0  Hemoglobin 11.5   Imaging studies: IMPRESSION: 1. Limited axial series through upper thoracic spine. 2. Severe canal stenosis at T1-2 with cord compression without definite cord  edema.   Medical tests:  no   Test discussed with performing physician:  no  Decision to obtain old records:  no  Review and summation of old records:  no  Scheduled Meds: . allopurinol  300 mg Oral Daily  . aspirin EC  81 mg Oral QHS  . Chlorhexidine Gluconate Cloth  6 each Topical Q0600  . cyclobenzaprine  10 mg Oral  Daily  . cycloSPORINE  1 drop Both Eyes BID  . enoxaparin (LOVENOX) injection  0.5 mg/kg Subcutaneous Q24H  . folic acid  1 mg Oral Daily  . gabapentin  800 mg Oral TID  . Gerhardt's butt cream   Topical QID  . HYDROcodone-acetaminophen  2 tablet Oral TID  . insulin aspart  0-20 Units Subcutaneous TID WC  . insulin aspart  0-5 Units Subcutaneous QHS  . insulin NPH Human  70 Units Subcutaneous BID AC & HS  . lidocaine  1 patch Transdermal Q24H  . loratadine  10 mg Oral Daily  . methylPREDNISolone (SOLU-MEDROL) injection  40 mg Intravenous Q12H  . metoprolol tartrate  75 mg Oral BID  . montelukast  10 mg Oral QHS  . mupirocin ointment  1 application Nasal BID  . oxybutynin  10 mg Oral Daily  . pantoprazole  40 mg Oral BID   Continuous Infusions:  Active Problems:   Morbid obesity ([place])   ANEMIA-NOS   Essential hypertension   GERD   Sleep apnea   Gout   Weakness generalized   Obstructive sleep apnea   DM neuropathy, type II diabetes mellitus (HCC)   Dehydration   AKI (acute kidney injury) ([place])   Hypotension   Syncope, vasovagal   Syncope   LOS: 3 days

## 2018-02-16 NOTE — Clinical Social Work Note (Signed)
Clinical Social Work Assessment  Patient Details  Name: Christy May MRN: [id number] Date of Birth: 1950-10-25  Date of referral:  02/16/18               Reason for consult:  Facility Placement                Permission sought to share information with:  Chartered certified accountant granted to share information::  Yes, Verbal Permission Granted  Name::        Agency::     Relationship::     Contact Information:     Housing/Transportation Living arrangements for the past 2 months:  Single Family Home Source of Information:  Patient Patient Interpreter Needed:  None Criminal Activity/Legal Involvement Pertinent to Current Situation/Hospitalization:  No - Comment as needed Significant Relationships:  Friend, Church, Other Family Members Lives with:  Self Do you feel safe going back to the place where you live?  (PT recommending SNF) Need for family participation in patient care:  No (Coment)  Care giving concerns:  Patient from home alone. Patient reported that she needs assistance with ADLs at baseline and gets support from home health services, friends, family members and church members. PT recommending SNF.   Social Worker assessment / plan:  CSW spoke with patient at bedside regarding discharge planning and PT recommendation for SNF. Patient reported that she has been to SNF in the past and agreeable to returning. CSW explained SNF placement process and medicare coverage, patient verbalized understanding. CSW agreed to complete patient's FL2 and follow up with bed offers. Patient reported that she prefers to return to [facility] if possible, CSW agreed to follow up with patient's preferred SNF.  CSW will complete patient's FL2 and follow up with bed offers. CSW will continue to follow and assist with discharge planning.  Employment status:  Disabled (Comment on whether or not currently receiving Disability) Insurance information:  Medicare, Medicaid In  [place] PT Recommendations:  [place] / Referral to community resources:  [place]  Patient/Family's Response to care:  Patient appreciative of CSW assistance with discharge planning.  Patient/Family's Understanding of and Emotional Response to Diagnosis, Current Treatment, and Prognosis:  Patient was engaged throughout assessment and presented appropriate to the situation. Patient verbalized understanding of current treatment plan and verbalized plan to dc to SNF for ST rehab. Patient reported that she is hopeful to get results from MRI so she can learn more about medical diagnosis and treatment options.   Emotional Assessment Appearance:  Appears stated age Attitude/Demeanor/Rapport:  Engaged Affect (typically observed):  Appropriate Orientation:  Oriented to Self, Oriented to Situation, Oriented to Place, Oriented to  Time Alcohol / Substance use:  Not Applicable Psych involvement (Current and /or in the community):  No (Comment)  Discharge Needs  Concerns to be addressed:  Care Coordination Readmission within the last 30 days:  No Current discharge risk:  Dependent with Mobility, Lives alone Barriers to Discharge:  Continued Medical Work up   [employer], LCSW 02/16/2018, 11:32 AM

## 2018-02-16 NOTE — Progress Notes (Signed)
D/w Dr. Clare Gandy [facility] under care of [facility] for tentative Laminectomy in am 8/29 Lyndel Safe peri-op risk ~ 1% for peri-op Cardiac issues--main issue post-op might be weaning off Vent Will update my partner Dr. Jerilee Hoh as to the same

## 2018-02-16 NOTE — Progress Notes (Signed)
Report given to Lulu Riding, RN at [facility]. Pt will be transferred to room 5N09C at [facility] via [place].

## 2018-02-16 NOTE — Care Management Note (Signed)
Case Management Note  Patient Details  Name: Christy May MRN: [id number] Date of Birth: 1951-05-21  Subjective/Objective:Noted For decompression in am per neuro surgery-transfer to [facility]. D/c plan SNF once medically stable.                    Action/Plan:d/c SNF.   Expected Discharge Date:  02/14/18               Expected Discharge Plan:  Skilled Nursing Facility  In-House Referral:  Clinical Social Work  Discharge planning Services  CM Consult  Post Acute Care Choice:    Choice offered to:     DME Arranged:    DME Agency:     HH Arranged:    [place] Agency:     Status of Service:  In process, will continue to follow  If discussed at Long Length of Stay Meetings, dates discussed:    Additional Comments:  Dessa Phi, RN 02/16/2018, 2:51 PM

## 2018-02-17 ENCOUNTER — Encounter (HOSPITAL_COMMUNITY)
Admission: EM | Disposition: A | Discharge status: Skilled Nursing Facility | Payer: Self-pay | Source: Home / Self Care | Attending: Internal Medicine

## 2018-02-17 ENCOUNTER — Inpatient Hospital Stay (HOSPITAL_COMMUNITY): Payer: Medicare Other | Admitting: Certified Registered Nurse Anesthetist

## 2018-02-17 ENCOUNTER — Inpatient Hospital Stay (HOSPITAL_COMMUNITY): Payer: Medicare Other

## 2018-02-17 DIAGNOSIS — D649 Anemia, unspecified: Secondary | ICD-10-CM

## 2018-02-17 DIAGNOSIS — E860 Dehydration: Secondary | ICD-10-CM

## 2018-02-17 DIAGNOSIS — M4714 Other spondylosis with myelopathy, thoracic region: Secondary | ICD-10-CM | POA: Diagnosis present

## 2018-02-17 DIAGNOSIS — N179 Acute kidney failure, unspecified: Secondary | ICD-10-CM

## 2018-02-17 DIAGNOSIS — R55 Syncope and collapse: Secondary | ICD-10-CM

## 2018-02-17 DIAGNOSIS — Z794 Long term (current) use of insulin: Secondary | ICD-10-CM

## 2018-02-17 DIAGNOSIS — Z01818 Encounter for other preprocedural examination: Secondary | ICD-10-CM

## 2018-02-17 DIAGNOSIS — Z419 Encounter for procedure for purposes other than remedying health state, unspecified: Secondary | ICD-10-CM

## 2018-02-17 DIAGNOSIS — E1140 Type 2 diabetes mellitus with diabetic neuropathy, unspecified: Secondary | ICD-10-CM

## 2018-02-17 HISTORY — PX: THORACIC DISCECTOMY: SHX6113

## 2018-02-17 LAB — BASIC METABOLIC PANEL
ANION GAP: 11 (ref 5–15)
BUN: 25 mg/dL — ABNORMAL HIGH (ref 8–23)
CALCIUM: 9.9 mg/dL (ref 8.9–10.3)
CHLORIDE: 100 mmol/L (ref 98–111)
CO2: 28 mmol/L (ref 22–32)
Creatinine, Ser: 1.08 mg/dL — ABNORMAL HIGH (ref 0.44–1.00)
GFR calc Af Amer: 60 mL/min — ABNORMAL LOW (ref >60)
GFR calc non Af Amer: 52 mL/min — ABNORMAL LOW (ref >60)
Glucose, Bld: 271 mg/dL — ABNORMAL HIGH (ref 70–99)
POTASSIUM: 4.2 mmol/L (ref 3.5–5.1)
Sodium: 139 mmol/L (ref 135–145)

## 2018-02-17 LAB — SURGICAL PCR SCREEN
MRSA, PCR: POSITIVE — AB
Staphylococcus aureus: POSITIVE — AB

## 2018-02-17 LAB — GLUCOSE, CAPILLARY
GLUCOSE-CAPILLARY: 155 mg/dL — AB (ref 70–99)
GLUCOSE-CAPILLARY: 147 mg/dL — AB (ref 70–99)
GLUCOSE-CAPILLARY: 170 mg/dL — AB (ref 70–99)
Glucose-Capillary: 316 mg/dL — ABNORMAL HIGH (ref 70–99)
Glucose-Capillary: 183 mg/dL — ABNORMAL HIGH (ref 70–99)
Glucose-Capillary: 116 mg/dL — ABNORMAL HIGH (ref 70–99)

## 2018-02-17 SURGERY — THORACIC DISCECTOMY
Anesthesia: General | Site: Spine Thoracic

## 2018-02-17 MED ORDER — LACTATED RINGERS IV SOLN
INTRAVENOUS | Status: DC
  Administered 2018-02-17: 15:00:00 via INTRAVENOUS

## 2018-02-17 MED ORDER — EPHEDRINE SULFATE-NACL 50-0.9 MG/10ML-% IV SOSY
PREFILLED_SYRINGE | INTRAVENOUS | Status: DC | PRN
  Administered 2018-02-17: 5 mg via INTRAVENOUS
  Administered 2018-02-17 (×2): 10 mg via INTRAVENOUS

## 2018-02-17 MED ORDER — SODIUM CHLORIDE 0.9 % IV SOLN
INTRAVENOUS | Status: DC | PRN
  Administered 2018-02-17: 500 mL

## 2018-02-17 MED ORDER — ONDANSETRON 4 MG PO TBDP: 8.0000 mg | ORAL_TABLET | Freq: Three times a day (TID) | ORAL | Status: DC | PRN

## 2018-02-17 MED ORDER — ONDANSETRON HCL 4 MG/2ML IJ SOLN
INTRAMUSCULAR | Status: DC | PRN
  Administered 2018-02-17: 4 mg via INTRAVENOUS

## 2018-02-17 MED ORDER — PROPOFOL 10 MG/ML IV BOLUS
INTRAVENOUS | Status: AC
  Filled 2018-02-17: qty 20

## 2018-02-17 MED ORDER — BACITRACIN ZINC 500 UNIT/GM EX OINT
TOPICAL_OINTMENT | CUTANEOUS | Status: DC | PRN
  Administered 2018-02-17: 1 via TOPICAL

## 2018-02-17 MED ORDER — BUPIVACAINE-EPINEPHRINE (PF) 0.5% -1:200000 IJ SOLN
INTRAMUSCULAR | Status: DC | PRN
  Administered 2018-02-17: 10 mL

## 2018-02-17 MED ORDER — PHENYLEPHRINE 40 MCG/ML (10ML) SYRINGE FOR IV PUSH (FOR BLOOD PRESSURE SUPPORT)
PREFILLED_SYRINGE | INTRAVENOUS | Status: DC | PRN
  Administered 2018-02-17 (×2): 120 ug via INTRAVENOUS

## 2018-02-17 MED ORDER — DOCUSATE SODIUM 100 MG PO CAPS
100.0000 mg | ORAL_CAPSULE | Freq: Two times a day (BID) | ORAL | Status: DC
  Administered 2018-02-17 – 2018-02-22 (×11): 100 mg via ORAL
  Filled 2018-02-17 (×11): qty 1

## 2018-02-17 MED ORDER — ACETAMINOPHEN 650 MG RE SUPP: 650.0000 mg | RECTAL | Status: DC | PRN

## 2018-02-17 MED ORDER — BISACODYL 10 MG RE SUPP: 10.0000 mg | Freq: Every day | RECTAL | Status: DC | PRN

## 2018-02-17 MED ORDER — MIDAZOLAM HCL 2 MG/2ML IJ SOLN
INTRAMUSCULAR | Status: AC
  Filled 2018-02-17: qty 2

## 2018-02-17 MED ORDER — PHENOL 1.4 % MT LIQD: 1.0000 | OROMUCOSAL | Status: DC | PRN

## 2018-02-17 MED ORDER — INSULIN NPH (HUMAN) (ISOPHANE) 100 UNIT/ML ~~LOC~~ SUSP
35.0000 [IU] | Freq: Two times a day (BID) | SUBCUTANEOUS | Status: DC
  Administered 2018-02-18: 35 [IU] via SUBCUTANEOUS
  Filled 2018-02-17 (×2): qty 10

## 2018-02-17 MED ORDER — ROCURONIUM BROMIDE 10 MG/ML (PF) SYRINGE
PREFILLED_SYRINGE | INTRAVENOUS | Status: DC | PRN
  Administered 2018-02-17: 10 mg via INTRAVENOUS
  Administered 2018-02-17: 50 mg via INTRAVENOUS
  Administered 2018-02-17: 20 mg via INTRAVENOUS

## 2018-02-17 MED ORDER — ONDANSETRON HCL 4 MG PO TABS: 4.0000 mg | ORAL_TABLET | Freq: Four times a day (QID) | ORAL | Status: DC | PRN

## 2018-02-17 MED ORDER — PROMETHAZINE HCL 25 MG/ML IJ SOLN: 6.2500 mg | INTRAMUSCULAR | Status: DC | PRN

## 2018-02-17 MED ORDER — LACTATED RINGERS IV SOLN
INTRAVENOUS | Status: DC
  Administered 2018-02-18 – 2018-02-19 (×3): via INTRAVENOUS

## 2018-02-17 MED ORDER — SUGAMMADEX SODIUM 500 MG/5ML IV SOLN
INTRAVENOUS | Status: AC
  Filled 2018-02-17: qty 5

## 2018-02-17 MED ORDER — CYCLOBENZAPRINE HCL 10 MG PO TABS
10.0000 mg | ORAL_TABLET | Freq: Three times a day (TID) | ORAL | Status: DC | PRN
  Administered 2018-02-18 – 2018-02-21 (×6): 10 mg via ORAL
  Filled 2018-02-17 (×6): qty 1

## 2018-02-17 MED ORDER — MENTHOL 3 MG MT LOZG: 1.0000 | LOZENGE | OROMUCOSAL | Status: DC | PRN

## 2018-02-17 MED ORDER — SUGAMMADEX SODIUM 200 MG/2ML IV SOLN
INTRAVENOUS | Status: DC | PRN
  Administered 2018-02-17: 500 mg via INTRAVENOUS

## 2018-02-17 MED ORDER — BUPIVACAINE-EPINEPHRINE (PF) 0.5% -1:200000 IJ SOLN
INTRAMUSCULAR | Status: AC
  Filled 2018-02-17: qty 30

## 2018-02-17 MED ORDER — INSULIN NPH (HUMAN) (ISOPHANE) 100 UNIT/ML ~~LOC~~ SUSP
35.0000 [IU] | Freq: Two times a day (BID) | SUBCUTANEOUS | Status: DC
  Filled 2018-02-17: qty 10

## 2018-02-17 MED ORDER — HYDROMORPHONE HCL 1 MG/ML IJ SOLN
0.2500 mg | INTRAMUSCULAR | Status: DC | PRN
  Administered 2018-02-17 (×2): 0.5 mg via INTRAVENOUS

## 2018-02-17 MED ORDER — PROPOFOL 10 MG/ML IV BOLUS
INTRAVENOUS | Status: DC | PRN
  Administered 2018-02-17: 200 mg via INTRAVENOUS

## 2018-02-17 MED ORDER — ACETAMINOPHEN 325 MG PO TABS: 650.0000 mg | ORAL_TABLET | ORAL | Status: DC | PRN

## 2018-02-17 MED ORDER — VANCOMYCIN HCL 10 G IV SOLR
1250.0000 mg | Freq: Two times a day (BID) | INTRAVENOUS | Status: DC
  Administered 2018-02-18 – 2018-02-22 (×9): 1250 mg via INTRAVENOUS
  Filled 2018-02-17 (×11): qty 1250

## 2018-02-17 MED ORDER — ZOLPIDEM TARTRATE 5 MG PO TABS: 5.0000 mg | ORAL_TABLET | Freq: Every evening | ORAL | Status: DC | PRN

## 2018-02-17 MED ORDER — ACETAMINOPHEN 500 MG PO TABS
1000.0000 mg | ORAL_TABLET | Freq: Four times a day (QID) | ORAL | Status: AC
  Administered 2018-02-17: 1000 mg via ORAL
  Administered 2018-02-18: 500 mg via ORAL
  Administered 2018-02-18: 1000 mg via ORAL
  Filled 2018-02-17 (×3): qty 2

## 2018-02-17 MED ORDER — ONDANSETRON HCL 4 MG/2ML IJ SOLN: 4.0000 mg | Freq: Four times a day (QID) | INTRAMUSCULAR | Status: DC | PRN

## 2018-02-17 MED ORDER — MORPHINE SULFATE (PF) 4 MG/ML IV SOLN
4.0000 mg | INTRAVENOUS | Status: DC | PRN
  Administered 2018-02-18 – 2018-02-19 (×3): 4 mg via INTRAVENOUS
  Filled 2018-02-17 (×3): qty 1

## 2018-02-17 MED ORDER — LACTATED RINGERS IV SOLN
INTRAVENOUS | Status: DC | PRN
  Administered 2018-02-17: 15:00:00 via INTRAVENOUS

## 2018-02-17 MED ORDER — SODIUM CHLORIDE 0.9 % IV SOLN
INTRAVENOUS | Status: DC | PRN
  Administered 2018-02-17: 40 ug/min via INTRAVENOUS

## 2018-02-17 MED ORDER — BACITRACIN ZINC 500 UNIT/GM EX OINT
TOPICAL_OINTMENT | CUTANEOUS | Status: AC
  Filled 2018-02-17: qty 28.35

## 2018-02-17 MED ORDER — HEMOSTATIC AGENTS (NO CHARGE) OPTIME
TOPICAL | Status: DC | PRN
  Administered 2018-02-17 (×3): 1

## 2018-02-17 MED ORDER — THROMBIN 5000 UNITS EX SOLR
CUTANEOUS | Status: AC
  Filled 2018-02-17: qty 10000

## 2018-02-17 MED ORDER — FENTANYL CITRATE (PF) 250 MCG/5ML IJ SOLN
INTRAMUSCULAR | Status: AC
  Filled 2018-02-17: qty 5

## 2018-02-17 MED ORDER — HYDROMORPHONE HCL 1 MG/ML IJ SOLN
INTRAMUSCULAR | Status: AC
  Filled 2018-02-17: qty 1

## 2018-02-17 MED ORDER — MIDAZOLAM HCL 5 MG/5ML IJ SOLN
INTRAMUSCULAR | Status: DC | PRN
  Administered 2018-02-17: 2 mg via INTRAVENOUS

## 2018-02-17 MED ORDER — MUPIROCIN 2 % EX OINT
1.0000 "application " | TOPICAL_OINTMENT | Freq: Two times a day (BID) | CUTANEOUS | Status: AC
  Administered 2018-02-17 – 2018-02-18 (×5): 1 via NASAL
  Filled 2018-02-17: qty 22
  Filled 2018-02-17: qty 44

## 2018-02-17 MED ORDER — MEPERIDINE HCL 50 MG/ML IJ SOLN: 6.2500 mg | INTRAMUSCULAR | Status: DC | PRN

## 2018-02-17 MED ORDER — FENTANYL CITRATE (PF) 250 MCG/5ML IJ SOLN
INTRAMUSCULAR | Status: DC | PRN
  Administered 2018-02-17: 100 ug via INTRAVENOUS

## 2018-02-17 MED ORDER — LIDOCAINE 2% (20 MG/ML) 5 ML SYRINGE
INTRAMUSCULAR | Status: DC | PRN
  Administered 2018-02-17: 100 mg via INTRAVENOUS

## 2018-02-17 MED ORDER — OXYCODONE HCL 5 MG PO TABS: 5.0000 mg | ORAL_TABLET | ORAL | Status: DC | PRN

## 2018-02-17 MED ORDER — ALUM & MAG HYDROXIDE-SIMETH 200-200-20 MG/5ML PO SUSP: 30.0000 mL | Freq: Four times a day (QID) | ORAL | Status: DC | PRN

## 2018-02-17 MED ORDER — OXYCODONE HCL 5 MG PO TABS
10.0000 mg | ORAL_TABLET | ORAL | Status: DC | PRN
  Administered 2018-02-18 – 2018-02-22 (×11): 10 mg via ORAL
  Filled 2018-02-17 (×13): qty 2

## 2018-02-17 MED ORDER — THROMBIN 5000 UNITS EX SOLR
CUTANEOUS | Status: DC | PRN
  Administered 2018-02-17 (×2): 5000 [IU] via TOPICAL

## 2018-02-17 SURGICAL SUPPLY — 62 items
AGENT HMST KT MTR STRL THRMB (HEMOSTASIS) ×2
APL SKNCLS STERI-STRIP NONHPOA (GAUZE/BANDAGES/DRESSINGS) ×1
BAG DECANTER FOR FLEXI CONT (MISCELLANEOUS) ×3 IMPLANT
BENZOIN TINCTURE PRP APPL 2/3 (GAUZE/BANDAGES/DRESSINGS) ×3 IMPLANT
BLADE CLIPPER SURG (BLADE) IMPLANT
BUR MATCHSTICK NEURO 3.0 LAGG (BURR) ×3 IMPLANT
BUR PRECISION FLUTE 6.0 (BURR) ×3 IMPLANT
CANISTER SUCT 3000ML PPV (MISCELLANEOUS) ×3 IMPLANT
CARTRIDGE OIL MAESTRO DRILL (MISCELLANEOUS) ×1 IMPLANT
CLOSURE WOUND 1/2 X4 (GAUZE/BANDAGES/DRESSINGS) ×1
DIFFUSER DRILL AIR PNEUMATIC (MISCELLANEOUS) ×3 IMPLANT
DRAPE LAPAROTOMY 100X72X124 (DRAPES) ×3 IMPLANT
DRAPE MICROSCOPE LEICA (MISCELLANEOUS) ×3 IMPLANT
DRAPE POUCH INSTRU U-SHP 10X18 (DRAPES) ×1 IMPLANT
DRAPE SURG 17X23 STRL (DRAPES) ×12 IMPLANT
DRSG OPSITE POSTOP 4X6 (GAUZE/BANDAGES/DRESSINGS) ×2 IMPLANT
ELECT BLADE 4.0 EZ CLEAN MEGAD (MISCELLANEOUS) ×3
ELECT REM PT RETURN 9FT ADLT (ELECTROSURGICAL) ×3
ELECTRODE BLDE 4.0 EZ CLN MEGD (MISCELLANEOUS) ×1 IMPLANT
ELECTRODE REM PT RTRN 9FT ADLT (ELECTROSURGICAL) ×1 IMPLANT
EVACUATOR 1/8 PVC DRAIN (DRAIN) ×2 IMPLANT
GAUZE 4X4 16PLY RFD (DISPOSABLE) IMPLANT
GAUZE SPONGE 4X4 12PLY STRL (GAUZE/BANDAGES/DRESSINGS) ×3 IMPLANT
GLOVE BIO SURGEON STRL SZ 6.5 (GLOVE) ×1 IMPLANT
GLOVE BIO SURGEON STRL SZ8 (GLOVE) ×5 IMPLANT
GLOVE BIO SURGEON STRL SZ8.5 (GLOVE) ×5 IMPLANT
GLOVE BIO SURGEONS STRL SZ 6.5 (GLOVE) ×1
GLOVE BIOGEL PI IND STRL 6.5 (GLOVE) IMPLANT
GLOVE BIOGEL PI IND STRL 7.5 (GLOVE) IMPLANT
GLOVE BIOGEL PI IND STRL 8 (GLOVE) IMPLANT
GLOVE BIOGEL PI INDICATOR 6.5 (GLOVE) ×2
GLOVE BIOGEL PI INDICATOR 7.5 (GLOVE) ×2
GLOVE BIOGEL PI INDICATOR 8 (GLOVE) ×8
GLOVE ECLIPSE 7.0 STRL STRAW (GLOVE) ×2 IMPLANT
GLOVE ECLIPSE 7.5 STRL STRAW (GLOVE) ×6 IMPLANT
GOWN STRL REUS W/ TWL LRG LVL3 (GOWN DISPOSABLE) IMPLANT
GOWN STRL REUS W/ TWL XL LVL3 (GOWN DISPOSABLE) ×1 IMPLANT
GOWN STRL REUS W/TWL 2XL LVL3 (GOWN DISPOSABLE) ×2 IMPLANT
GOWN STRL REUS W/TWL LRG LVL3 (GOWN DISPOSABLE) ×3
GOWN STRL REUS W/TWL XL LVL3 (GOWN DISPOSABLE) ×6
KIT BASIN OR (CUSTOM PROCEDURE TRAY) ×3 IMPLANT
KIT TURNOVER KIT B (KITS) ×3 IMPLANT
NDL HYPO 21X1.5 SAFETY (NEEDLE) IMPLANT
NEEDLE HYPO 21X1.5 SAFETY (NEEDLE) ×3 IMPLANT
NEEDLE HYPO 22GX1.5 SAFETY (NEEDLE) ×3 IMPLANT
NS IRRIG 1000ML POUR BTL (IV SOLUTION) ×3 IMPLANT
OIL CARTRIDGE MAESTRO DRILL (MISCELLANEOUS) ×3
PACK LAMINECTOMY NEURO (CUSTOM PROCEDURE TRAY) ×3 IMPLANT
PAD ARMBOARD 7.5X6 YLW CONV (MISCELLANEOUS) ×9 IMPLANT
PATTIES SURGICAL .5 X1 (DISPOSABLE) IMPLANT
RUBBERBAND STERILE (MISCELLANEOUS) ×6 IMPLANT
SPONGE SURGIFOAM ABS GEL SZ50 (HEMOSTASIS) ×3 IMPLANT
STRIP CLOSURE SKIN 1/2X4 (GAUZE/BANDAGES/DRESSINGS) ×2 IMPLANT
SURGIFLO W/THROMBIN 8M KIT (HEMOSTASIS) ×4 IMPLANT
SUT VIC AB 1 CT1 18XBRD ANBCTR (SUTURE) ×2 IMPLANT
SUT VIC AB 1 CT1 8-18 (SUTURE) ×3
SUT VIC AB 2-0 CP2 18 (SUTURE) ×6 IMPLANT
TOWEL GREEN STERILE (TOWEL DISPOSABLE) ×3 IMPLANT
TOWEL GREEN STERILE FF (TOWEL DISPOSABLE) ×3 IMPLANT
TUBE CONNECTING 12'X1/4 (SUCTIONS) ×1
TUBE CONNECTING 12X1/4 (SUCTIONS) ×1 IMPLANT
WATER STERILE IRR 1000ML POUR (IV SOLUTION) ×3 IMPLANT

## 2018-02-17 NOTE — Progress Notes (Signed)
Transitions of Care Polypharmacy Medication Deprescribing Review  Christy May is a 67 y.o. female admitted with dehydration/hypotension on 02/11/2018.   The patient's PTA medications and comorbidities were reviewed to identify opportunities for medication optimization through deprescribing.   Short-Term Recommendations: . Consider discontinuing pantoprazole due to risk for adverse events with long-term PPI use. Although patient is taking chronic steroids, she is without concomitant NSAID use and has ranitidine for GERD symptoms. Consider first decreasing pantoprazole to once daily dosing to assess for rebound symptoms and then discontinuing.   Long-Term Recommendations: . Consider monitoring for symptom improvement with meclizine to ensure patient is experiencing benefit that exceeds risks for anticholinergic adverse events. . Consider checking vitamin D level to see if continued supplementation still necessary. . Consider evaluating need for long-term prednisone use.   Patient Assessment:  . Extent of Polypharmacy: 28 PTA medications, hyperpolypharmacy (10+ meds) o Associated risk: severe o Number of home Rx meds: 20 o Number of home OTC meds: 8 . Charlson Comorbidity Index (CCI): 4 o Associated risk: moderate-severe (53% estimated 10-year survival) . GerontoNet Adverse Drug Event Risk Score: 6 o Associated risk: moderate (~12% risk ADE)  Overall risk: moderate to high; high level of hyperpolypharmacy but risk scores lower than could be expected     Medication Assessment:  . Potentially Inappropriate Medications (PIMs)  o High risk medications: 1 - Meclizine  o Moderate risk medications: 5 - Hydrocodone-acetaminophen, oxybutynin, pantoprazole, cyclobenzaprine, tramadol o Low risk medications: 1 - Prednisone  . Medications Associated with Geriatric Syndromes (MAGS): 10 o Hydrocodone-acetaminophen, insulin NPH, meclizine, oxybutynin, pantoprazole, prednisone,  cyclobenzaprine, irbesartan, metoprolol tartrate, tramadol  Overall risk: moderate to high; many MAGS, multiple PIMs including one high risk   The patient was interviewed to determine her interest in making medication changes. Shared decision-making was used to determine the optimal medications to deprescribe.   Patient Deprescribing Readiness: . Patient Opinions About Deprescribing survey score: 6 o Score of -8 to -6: negative patient opinion of deprescribing; deprescribing likely to be unsuccessful o Score of -4 to 4: intermediate patient opinion of deprescribing; deprescribing may be successful o Score of 6 to 8: positive patient opinion of deprescribing; deprescribing likely to be successful   Shared Decision-Making Discussion:  . Patient expressed strong interest in deprescribing, stating that she really does not like taking so many medications and used to get depressed about this fact. She was somewhat knowledgeable about medication names and usages, able to discuss them after prompting. . Patient expressed concerns about medications making her feel "like a zombie" and that her allopurinol and metformin pills looked identical, making it difficult to distinguish between them in her pillbox. We discussed using the letter/number markings on the pills to tell the difference, and she expressed understanding. . When asked further about the "zombie" feeling, she reported that pain pills in the past have made her feel tired and out of it, but if she does not take the pills she experiences a lot of pain. She was alert during our conversation, which she claimed to be her baseline, and when asked individually about her medications did not report issues with somnolence or delirium upon taking them. She was advised about the risks for drowsiness and respiratory depression with hydrocodone, tramadol, and gabapentin together, but seemed less concerned about this throughout our talk.  . Patient reported  taking cyclobenzaprine nightly to help with both muscle spasms and insomnia. She was advised of risk for anticholinergic adverse events but reported she found this medication  very helpful. Patient reported recently starting meclizine for new vertigo symptoms and reported that she thought the medication was helping but was not sure. She was advised of the risk for anticholinergic adverse events with meclizine and advised to monitor vertigo symptoms after medication administration to ensure that that benefit exceeds risk. . Patient did not report issues with dyspepsia or acid reflux and questioned how many medications she was taking for this purpose. She reported being unsure why she needed pantoprazole + ranitidine for treatment and expressed a desire to try using just one. . Patient reported being open to any changes except with her pain regimen and that she was looking forward to discussing further with her primary physician.    Please see above for final recommendations.  Oralia Manis, Student-PharmD 02/17/2018 11:24 AM

## 2018-02-17 NOTE — Progress Notes (Signed)
PT Cancellation Note  Patient Details Name: GENELL THEDE MRN: [id number] DOB: 01/22/1951   Cancelled Treatment:    Reason Eval/Treat Not Completed: Medical issues which prohibited therapy(going for surgery this pm)   [facility] 02/17/2018, 1:57 PM

## 2018-02-17 NOTE — Consult Note (Signed)
[place] Nurse wound consult note Evaluation completed in [facility] 5N09.  No family present. Reason for Consult: Stage 2 on buttocks Wound type: MASD from incontinence.  This is not a pressure injury on the buttocks My colleague, Maudie Flakes WOC, evaluated the patient on 02/12/18.  It was determined at that time the patient had scattered full and partial thickness lesions to the bilateral buttocks from moisture.  My findings today support that as well.  The patient is currently on a bariatric air mattress and has orders for Gerhart's butt cream.  She is in a supine position and explains that she occasionally turns.  I have encouraged her to turn frequently.  There is not a need to change therapy options at this time. Monitor the wound area(s) for worsening of condition such as: Signs/symptoms of infection,  Increase in size,  Development of or worsening of odor, Development of pain, or increased pain at the affected locations.  Notify the medical team if any of these develop.  Thank you for the consult.  Discussed plan of care with the patient and bedside nurse.  [place] nurse will not follow at this time.  Please re-consult the [place] team if needed.  Val Riles, RN, MSN, CWOCN, CNS-BC, pager [phone]

## 2018-02-17 NOTE — Progress Notes (Signed)
OT Cancellation Note  Patient Details Name: Christy May MRN: [id number] DOB: 03-31-1951   Cancelled Treatment:    Reason Eval/Treat Not Completed: Patient at procedure or test/ unavailable. Pt leaving for back sx. Will need post op orders to resume therapy.   Malka So 02/17/2018, 1:56 PM  02/17/2018 Nestor Lewandowsky, OTR/L Pager: [phone]

## 2018-02-17 NOTE — Progress Notes (Signed)
PROGRESS NOTE    Christy May  HQP:[id number] DOB: December 29, 1950 DOA: 02/11/2018 PCP: Lucianne Lei, MD    Brief Narrative: 67 year old with Body mass index is 53.74 kg/m.  DM TY 2 on oral meds, HTN, anemia, gout, COPD on chronic steroids airway syndrome under Dr. Lamonte Sakai for the past year, OSA on CPAP, reflux, ch kidney disease stage III, urge incontinence, mostly wheelchair-bound and has home health PT in the past  Noted in June 2019 was admitted with low back pain right leg weakness MRI at that time showed lumbar stenosis at multiple levels and she followed up with Dr. Arnoldo Morale  She also had a prior admission earlier in June with sepsis and renal failure. Admitted after syncope episode. She has been wheelchair bound for last 2 months due to LE weakness.    Assessment & Plan:   Active Problems:   Morbid obesity ([place])   ANEMIA-NOS   Essential hypertension   GERD   Sleep apnea   Gout   Weakness generalized   Obstructive sleep apnea   DM neuropathy, type II diabetes mellitus ([facility])   Dehydration   AKI (acute kidney injury) ([place])   Hypotension   Syncope, vasovagal   Syncope  1-Thoracic  spinal stenosis,  For surgery this afternoon.  EKG with ST changes and inversion, compare to EKG done on admission. For this reason will consult cardiology for clearance.  Also patient report chronic MSK pain, since she had sx in the past.   2-Syncope;  Presented after syncope, while been evaluated by EMS.  ECHO normal EF.  Mild elevation of troponin.    Upper airway syndrome; COPD On chronic prednisone. On IV solumedrol On steroids. Nebulizer.   DM; peripheral neuropathy;  Continue with gabapentin.  Will decrease NPH. Lower home dose, due to NPO status.   Decubitus stage 2 sacral wound, present on admission. Wound care following.   HTN; on metoprolol.   History of gout. Continue with allopurinol.   History of dizziness; on meclizine.    DVT prophylaxis: SCD Code Status: Full  code.  Family Communication: care discussed with patient.  Disposition Plan: to be determine.   Consultants:   Cardiology    Procedures:   ECHO; normal EF   Antimicrobials: none   Subjective: She is alert ,denies chest pain or dyspnea currently.  She does report a history of chest pain, MSK since she had Sx.    Objective: Vitals:   02/16/18 0641 02/16/18 1543 02/16/18 1931 02/17/18 0457  BP: 140/69 137/76 (!) 148/75 (!) 153/72  Pulse: (!) 110 89 82 75  Resp: 20 16 17 18   Temp: 99.5 F (37.5 C) 99 F (37.2 C) 99.4 F (37.4 C)   TempSrc: Oral Oral Oral Axillary  SpO2: 96% 96% 96% 94%  Weight:      Height:        Intake/Output Summary (Last 24 hours) at 02/17/2018 0930 Last data filed at 02/17/2018 0845 Gross per 24 hour  Intake 360 ml  Output 1950 ml  Net -1590 ml   Filed Weights   02/11/18 1455  Weight: (!) 142 kg    Examination:  General exam: Appears calm and comfortable  Respiratory system: Clear to auscultation. Respiratory effort normal. Cardiovascular system: S1 & S2 heard, RRR. No JVD, murmurs, rubs, gallops or clicks. No pedal edema. Gastrointestinal system: Abdomen is nondistended, soft and nontender. No organomegaly or masses felt. Normal bowel sounds heard. Central nervous system: Alert and oriented.  Extremities:  No edema  Data Reviewed: I have personally reviewed following labs and imaging studies  CBC: Recent Labs  Lab 02/11/18 1541 02/12/18 0125 02/16/18 0538  WBC 10.4 10.5 9.9  NEUTROABS 8.5*  --   --   HGB 12.0 11.8* 11.5*  HCT 38.3 37.5 36.3  MCV 89.9 90.4 89.4  PLT 279 220 510   Basic Metabolic Panel: Recent Labs  Lab 02/11/18 1541 02/12/18 0125 02/13/18 1431 02/16/18 0538  NA 139 138 139 139  K 5.0 4.7 4.8 4.5  CL 100 101 102 101  CO2 26 26 26 28   GLUCOSE 268* 322* 352* 240*  BUN 45* 39* 23 26*  CREATININE 1.76* 1.51* 1.32* 1.10*  CALCIUM 9.5 9.1 9.5 9.6  MG  --  1.8  --   --   PHOS  --  3.0  --   --     GFR: Estimated Creatinine Clearance: 69.2 mL/min (A) (by C-G formula based on SCr of 1.1 mg/dL (H)). Liver Function Tests: Recent Labs  Lab 02/11/18 1541 02/12/18 0125  AST 13* 15  ALT 15 18  ALKPHOS 62 61  BILITOT 0.6 0.4  PROT 7.0 6.7  ALBUMIN 3.0* 2.9*   No results for input(s): LIPASE, AMYLASE in the last 168 hours. No results for input(s): AMMONIA in the last 168 hours. Coagulation Profile: No results for input(s): INR, PROTIME in the last 168 hours. Cardiac Enzymes: Recent Labs  Lab 02/11/18 2039 02/12/18 0125 02/12/18 0733 02/12/18 1454  TROPONINI <0.03 0.03* 0.05* 0.04*   BNP (last 3 results) No results for input(s): PROBNP in the last 8760 hours. HbA1C: No results for input(s): HGBA1C in the last 72 hours. CBG: Recent Labs  Lab 02/16/18 0733 02/16/18 1210 02/16/18 1644 02/16/18 2130 02/17/18 0715  GLUCAP 209* 345* 382* 318* 316*   Lipid Profile: No results for input(s): CHOL, HDL, LDLCALC, TRIG, CHOLHDL, LDLDIRECT in the last 72 hours. Thyroid Function Tests: No results for input(s): TSH, T4TOTAL, FREET4, T3FREE, THYROIDAB in the last 72 hours. Anemia Panel: No results for input(s): VITAMINB12, FOLATE, FERRITIN, TIBC, IRON, RETICCTPCT in the last 72 hours. Sepsis Labs: Recent Labs  Lab 02/11/18 1601  LATICACIDVEN 1.41    Recent Results (from the past 240 hour(s))  MRSA PCR Screening     Status: Abnormal   Collection Time: 02/11/18 10:11 PM  Result Value Ref Range Status   MRSA by PCR POSITIVE (A) NEGATIVE Final    Comment:        The GeneXpert MRSA Assay (FDA approved for NASAL specimens only), is one component of a comprehensive MRSA colonization surveillance program. It is not intended to diagnose MRSA infection nor to guide or monitor treatment for MRSA infections. RESULT CALLED TO, READ BACK BY AND VERIFIED WITH: ASARO,J RN AT 2585 02/12/18 BY TIBBITTS,K Performed at [facility], [place] [address]  [place], [place] [address]   Surgical PCR screen     Status: Abnormal   Collection Time: 02/17/18  1:03 AM  Result Value Ref Range Status   MRSA, PCR POSITIVE (A) NEGATIVE Final    Comment: RESULT CALLED TO, READ BACK BY AND VERIFIED WITH: A DIAZ RN [phone] 02/17/18 A BROWNING    Staphylococcus aureus POSITIVE (A) NEGATIVE Final    Comment: (NOTE) The Xpert SA Assay (FDA approved for NASAL specimens in patients 55 years of age and older), is one component of a comprehensive surveillance program. It is not intended to diagnose infection nor to guide or monitor treatment. Performed at [facility]  Lab, [address], [place], [place] [address]          Radiology Studies: Mr Thoracic Spine Wo Contrast  Result Date: 02/15/2018 CLINICAL DATA:  Follow-up upper thoracic spinal stenosis. History of ACDF. EXAM: MRI THORACIC SPINE WITHOUT CONTRAST TECHNIQUE: Axial, multisequence MR imaging of the thoracic spine was performed. No intravenous contrast was administered. COMPARISON:  MRI cervical and thoracic spine February 14, 2018 FINDINGS: Axial sequences through upper thoracic spine. Severe canal stenosis T1-2, AP dimension of the canal is 5 mm, low signal RIGHT posterolateral epidural space most compatible with calcified ligamentum flavum. Cervical spinal cord deformity without definite edema. No syrinx. Mild canal stenosis T3-4. Nondiagnostic assessment of neural foraminal narrowing on axial sequences. IMPRESSION: 1. Limited axial series through upper thoracic spine. 2. Severe canal stenosis at T1-2 with cord compression without definite cord edema. Electronically Signed   By: Elon Alas M.D.   On: 02/15/2018 18:54        Scheduled Meds: . allopurinol  300 mg Oral Daily  . Chlorhexidine Gluconate Cloth  6 each Topical Once  . cyclobenzaprine  10 mg Oral Daily  . cycloSPORINE  1 drop Both Eyes BID  . fluticasone  2 spray Each Nare Daily  . folic acid  1 mg Oral Daily  . gabapentin  800  mg Oral TID  . Gerhardt's butt cream   Topical QID  . HYDROcodone-acetaminophen  2 tablet Oral TID  . insulin aspart  0-20 Units Subcutaneous TID WC  . insulin aspart  0-5 Units Subcutaneous QHS  . insulin NPH Human  70 Units Subcutaneous BID AC & HS  . ipratropium  2 spray Each Nare BID  . lidocaine  1 patch Transdermal Q24H  . loratadine  10 mg Oral Daily  . methylPREDNISolone (SOLU-MEDROL) injection  40 mg Intravenous Q12H  . metoprolol tartrate  75 mg Oral BID  . montelukast  10 mg Oral QHS  . mupirocin ointment  1 application Nasal BID  . oxybutynin  10 mg Oral Daily  . pantoprazole  40 mg Oral BID  . tetrahydrozoline  1 drop Both Eyes BID   Continuous Infusions: . vancomycin       LOS: 4 days    Time spent: 35 minutes.     Elmarie Shiley, MD [facility] Pager [phone]  If 7PM-7AM, please contact night-coverage [email or web address] Password [email or web address] 02/17/2018, 9:30 AM

## 2018-02-17 NOTE — Progress Notes (Addendum)
Pharmacy Antibiotic Note  Christy May is a 67 y.o. female admitted on 02/11/2018 with surgical prophylaxis.  Pharmacy has been consulted for vanc dosing.  Christy May is s/p laminectomy. She does have a hemovac drain in, therefore, we will continue vanc until it's out.   Scr 1.08, Wt 142kg. Pre-op dose at 1348.   Plan: Vanc 1.25g IV q12 VT if needed  Height: 5\' 4"  (162.6 cm) Weight: (!) 313 lb 0.9 oz (142 kg) IBW/kg (Calculated) : 54.7  Temp (24hrs), Avg:97.2 F (36.2 C), Min:97.2 F (36.2 C), Max:97.2 F (36.2 C)  Recent Labs  Lab 02/11/18 1541 02/11/18 1601 02/12/18 0125 02/13/18 1431 02/16/18 0538 02/17/18 0951  WBC 10.4  --  10.5  --  9.9  --   CREATININE 1.76*  --  1.51* 1.32* 1.10* 1.08*  LATICACIDVEN  --  1.41  --   --   --   --     Estimated Creatinine Clearance: 70.5 mL/min (A) (by C-G formula based on SCr of 1.08 mg/dL (H)).    Allergies  Allergen Reactions  . Ace Inhibitors Anaphylaxis and Cough    Tolerates Irbesartan (home med)  . Penicillins Hives and Other (See Comments)    Tolerated ceftriaxone in 2019  Has patient had a PCN reaction causing immediate rash, facial/tongue/throat swelling, SOB or lightheadedness with hypotension: Yes Has patient had a PCN reaction causing severe rash involving mucus membranes or skin necrosis:  No Has patient had a PCN reaction that required hospitalization: No Has patient had a PCN reaction occurring within the last 10 years: No If all of the above answers are "NO", then may proceed with Cephalosporin use.  . Adhesive [Tape] Hives and Rash    Antimicrobials this admission: 8/29 vanc >>    Dose adjustments this admission:   Microbiology results:   Onnie Boer, PharmD, BCIDP, AAHIVP, CPP Infectious Disease Pharmacist Pager: [phone] 02/17/2018 8:38 PM

## 2018-02-17 NOTE — Consult Note (Addendum)
Cardiology Consultation:   Patient ID: Christy May; [id number]; 18-Mar-1951   Admit date: 02/11/2018 Date of Consult: 02/17/2018  Primary Care Provider: Lucianne Lei, MD Primary Cardiologist: New, Dr Harrington Challenger (saw Dr Rex Kras in the past) Primary Electrophysiologist:  n/a   Patient Profile:   Christy May is a 67 y.o. female with a hx of CP with nl myovue in  2017, nl EF by echo  02/12/2018 , HTN, DM2, anemia, gout, COPD w/ chronic steroids and airway syndrome, OSA on CPAP, CKD III, super morbid obesity, wheelchair bound 2nd LE weakness since June, who is being seen today for the preop evaluation of surgery for lumbar spinal stenosis at the request of Dr Tyrell Antonio.  History of Present Illness:   Christy May was admitted 08/23 w/ hypotension and possible near-syncope, decubitus ulcers.  She was diagnosed w/ lumbar spine stenosis 11/2017, has been very weak since then. SNF recommended>>pt refused>>home w/ HH but the [facility] felt she was not safe at home.   On arrival, she was treated with IV fluids and her blood pressure improved.  She was continued on CPAP.  She was seen by PT and OT, SNF recommended.  Radiological testing showed severe T1-2 spinal stenosis.  Dr. Arnoldo Morale consulted on 8/28 and noted thoracic stenosis with dense paraparesis.  He recommended T1-2 laminectomy.  Surgery is planned for 8/29 at approximately 3 PM.  Cardiac clearance requested.  Christy May is not able to lift her right leg.  She is not able to move herself from bed to chair.  She used to be able to stand and pivot, but is no longer able to do that.  She helps move herself as she can.  She denies shortness of breath with exertion.  She has been having intermittent chest pain ever since she had cervical spine surgery in 2006.  The chest pain was evaluated with a stress test while she was in the hospital and that was negative.  She continued to have intermittent chest pain and ended up getting an EGD by Dr. Elicia Lamp.  Dr. Maurene Capes told her that the pain was musculoskeletal and she describes it as a pressure and cramping pain.  Substernal to R side of chst   Like elephant at times  It is brought on by certain movements, it resolves spontaneously.  It is positional.    The pain has not changed much in the last 13 years.  The last episode was yesterday.  She had a cardiac catheterization for this pain among other issues in 2008 and it was clean.  She had a nuclear  stress test in 2016 that showed normal perfusion and a normal EF.   Past Medical History:  Diagnosis Date  . Allergy   . Anemia, unspecified   . Anxiety   . Arthritis    "neck, back, hands" (09/03/2014)  . Chronic airway obstruction, not elsewhere classified    "I was told I don't have this/tests done @ [facility] 05/2014"  . Chronic back pain   . Colon polyps   . Complication of anesthesia    "I've had recall; I probably stopped breathing at least 2 times" (09/03/2014)  . Dehydration   . Depressive disorder, not elsewhere classified   . Diabetic peripheral neuropathy ([place])   . Dysphagia 2007   historyof dysphagia with severe dysmotility by barium swallow-[name]  . Esophageal reflux   . Family history of adverse reaction to anesthesia    "my brother was told they lost him  a couple times during OR"  . Heart murmur   . History of hiatal hernia   . Migraine    "couple times/month right now" (09/03/2014)  . Obesity, unspecified   . OSA (obstructive sleep apnea)     CPAP  . Septic shock ([place]) 08/30/2017  . Spondylosis of unspecified site without mention of myelopathy   . Type II diabetes mellitus ([place])   . Unspecified essential hypertension   . Unspecified menopausal and postmenopausal disorder   . Unspecified vitamin D deficiency   . Upper airway cough syndrome     Past Surgical History:  Procedure Laterality Date  . ABDOMINAL HYSTERECTOMY     "partial"  . ANTERIOR CERVICAL DECOMP/DISCECTOMY FUSION     multiple cervical spine  levels;Dr. Arnoldo Morale  . APPENDECTOMY    . BACK SURGERY    . CHOLECYSTECTOMY OPEN    . COLONOSCOPY N/A 05/08/2014   Procedure: COLONOSCOPY;  Surgeon: [name], MD;  Location: [facility]OSCOPY;  Service: Endoscopy;  Laterality: N/A;  . DILATION AND CURETTAGE OF UTERUS    . ESOPHAGOGASTRODUODENOSCOPY N/A 05/08/2014   Procedure: ESOPHAGOGASTRODUODENOSCOPY (EGD);  Surgeon: [name], MD;  Location: [name];  Service: Endoscopy;  Laterality: N/A;  . EXPLORATORY LAPAROTOMY    . EYE SURGERY    . FOOT SURGERY Right X 2   "took blood out of my arms and put platelets in my feet"  . INCISION AND DRAINAGE ABSCESS     Chest  . JOINT REPLACEMENT    . REFRACTIVE SURGERY Bilateral   . TONSILLECTOMY    . TOTAL KNEE ARTHROPLASTY Left 10/2003  . TOTAL KNEE ARTHROPLASTY Right 01/2004   [name]     Prior to Admission medications   Medication Sig Start Date End Date Taking? Authorizing Provider  allopurinol (ZYLOPRIM) 300 MG tablet Take 300 mg by mouth daily.   Yes [provider]  aspirin EC 81 MG tablet Take 81 mg by mouth at bedtime.   Yes [provider]  Cholecalciferol (VITAMIN D-3) 1000 units CAPS Take 2,000 Units by mouth daily.   Yes [provider]  cyclobenzaprine (FLEXERIL) 10 MG tablet Take 10 mg by mouth daily. 12/06/17  Yes [provider]  cycloSPORINE (RESTASIS) 0.05 % ophthalmic emulsion Place 1 drop into both eyes 2 (two) times daily.    Yes [provider]  fluticasone (FLONASE) 50 MCG/ACT nasal spray SHAKE LIQUID AND USE 2 SPRAYS IN EACH NOSTRIL DAILY Patient taking differently: Place 2 sprays into both nostrils daily.  03/08/17  Yes Collene Gobble, MD  folic acid (FOLVITE) 1 MG tablet Take 1 mg by mouth once a day 09/03/17  Yes Aline August, MD  gabapentin (NEURONTIN) 800 MG tablet Take 800 mg by mouth 3 (three) times daily.   Yes [provider]  HYDROcodone-acetaminophen (NORCO/VICODIN) 5-325 MG tablet Take 1 tablet by  mouth every 6 (six) hours as needed. 12/09/17  Yes Maczis, Barth Kirks, PA-C  insulin NPH Human (NOVOLIN N RELION) 100 UNIT/ML injection Inject 1.2 mLs (120 Units total) into the skin every morning. 03/09/17  Yes Renato Shin, MD  ipratropium (ATROVENT) 0.03 % nasal spray USE 2 SPRAYS IN EACH NOSTRIL THREE TIMES DAILY AS NEEDED FOR RHINITIS Patient taking differently: Place 2 sprays into both nostrils 2 (two) times daily.  03/17/17  Yes Collene Gobble, MD  irbesartan (AVAPRO) 300 MG tablet Take 300 mg by mouth daily.   Yes [provider]  loratadine (CLARITIN) 10 MG tablet Take  10 mg by mouth daily.   Yes [provider]  meclizine (ANTIVERT) 25 MG tablet Take 1 tablet (25 mg total) by mouth 3 (three) times daily as needed for dizziness. 02/06/18  Yes Dorie Rank, MD  metFORMIN (GLUCOPHAGE) 500 MG tablet Take 1 tablet (500 mg total) by mouth 2 (two) times daily with a meal. 01/17/18  Yes Renato Shin, MD  metoprolol (LOPRESSOR) 50 MG tablet Take 75 mg by mouth 2 (two) times daily.   Yes [provider]  montelukast (SINGULAIR) 10 MG tablet TAKE 1 TABLET(10 MG) BY MOUTH DAILY Patient taking differently: Take 10 mg by mouth at bedtime.  04/03/16  Yes Magdalen Spatz, NP  Multiple Vitamins-Minerals (MULTIVITAMIN WITH MINERALS) tablet Take 1 tablet by mouth daily.   Yes [provider]  nystatin cream (MYCOSTATIN) Apply 1 application topically 2 (two) times daily as needed for dry skin (skin irritation).   Yes [provider]  ondansetron (ZOFRAN ODT) 8 MG disintegrating tablet Take 1 tablet (8 mg total) by mouth every 8 (eight) hours as needed for nausea or vomiting. 02/06/18  Yes Dorie Rank, MD  oxybutynin (DITROPAN-XL) 10 MG 24 hr tablet Take 1 tablet (10 mg total) by mouth daily. 02/18/15  Yes Biagio Borg, MD  pantoprazole (PROTONIX) 40 MG tablet TAKE 1 TABLET(40 MG) BY MOUTH TWICE DAILY Patient taking differently: Take 40 mg by mouth 2 (two) times daily.   03/02/17  Yes Collene Gobble, MD  predniSONE (DELTASONE) 10 MG tablet Take 2 tablets (20 mg total) by mouth daily with breakfast. 09/03/17  Yes Aline August, MD  ranitidine (ZANTAC) 300 MG tablet Take 1 tablet (300 mg total) by mouth 2 (two) times daily. 11/22/17  Yes Debbe Odea, MD  sodium chloride (OCEAN) 0.65 % SOLN nasal spray Place 2 sprays into both nostrils 2 (two) times daily.   Yes [provider]  Tetrahydrozoline HCl (VISINE OP) Apply to eye.   Yes [provider]  traMADol (ULTRAM) 50 MG tablet Take 50 mg by mouth every 12 (twelve) hours as needed for moderate pain.   Yes [provider]  TURMERIC PO Take 1 tablet by mouth daily.   Yes [provider]  Insulin Syringe-Needle U-100 (INSULIN SYRINGE 1CC/31GX5/16") 31G X 5/16" 1 ML MISC USE TO INJECT INSULIN TWICE DAILY 05/11/17   Renato Shin, MD  ONE [facility] LANCETS MISC Use to check sugars twice daily DX E11.22 08/23/17   Renato Shin, MD  predniSONE (DELTASONE) 20 MG tablet TAKE 1 TABLET(20 MG) BY MOUTH DAILY WITH BREAKFAST Patient not taking: Reported on 02/11/2018 02/02/18   Collene Gobble, MD    Inpatient Medications: Scheduled Meds: . allopurinol  300 mg Oral Daily  . Chlorhexidine Gluconate Cloth  6 each Topical Once  . cyclobenzaprine  10 mg Oral Daily  . cycloSPORINE  1 drop Both Eyes BID  . fluticasone  2 spray Each Nare Daily  . folic acid  1 mg Oral Daily  . gabapentin  800 mg Oral TID  . Gerhardt's butt cream   Topical QID  . HYDROcodone-acetaminophen  2 tablet Oral TID  . insulin aspart  0-20 Units Subcutaneous TID WC  . insulin aspart  0-5 Units Subcutaneous QHS  . insulin NPH Human  35 Units Subcutaneous BID AC & HS  . ipratropium  2 spray Each Nare BID  . lidocaine  1 patch Transdermal Q24H  . loratadine  10 mg Oral Daily  . methylPREDNISolone (SOLU-MEDROL) injection  40  mg Intravenous Q12H  . metoprolol tartrate  75 mg Oral BID  . montelukast  10 mg Oral QHS  . mupirocin  ointment  1 application Nasal BID  . oxybutynin  10 mg Oral Daily  . pantoprazole  40 mg Oral BID  . tetrahydrozoline  1 drop Both Eyes BID   Continuous Infusions: . vancomycin     PRN Meds: acetaminophen **OR** acetaminophen, albuterol, meclizine, ondansetron **OR** ondansetron (ZOFRAN) IV  Allergies:    Allergies  Allergen Reactions  . Ace Inhibitors Anaphylaxis and Cough    Tolerates Irbesartan (home med)  . Penicillins Hives and Other (See Comments)    Tolerated ceftriaxone in 2019  Has patient had a PCN reaction causing immediate rash, facial/tongue/throat swelling, SOB or lightheadedness with hypotension: Yes Has patient had a PCN reaction causing severe rash involving mucus membranes or skin necrosis:  No Has patient had a PCN reaction that required hospitalization: No Has patient had a PCN reaction occurring within the last 10 years: No If all of the above answers are "NO", then may proceed with Cephalosporin use.  . Adhesive [Tape] Hives and Rash    Social History:   Social History   Socioeconomic History  . Marital status: Widowed    Spouse name: Not on file  . Number of children: 1  . Years of education: Not on file  . Highest education level: Not on file  Occupational History  . Occupation: disabled    Employer: DISABLED  Social Needs  . Financial resource strain: Not on file  . Food insecurity:    Worry: Not on file    Inability: Not on file  . Transportation needs:    Medical: Not on file    Non-medical: Not on file  Tobacco Use  . Smoking status: Former Smoker    Packs/day: 1.00    Years: 20.00    Pack years: 20.00    Types: Cigarettes    Last attempt to quit: 06/22/1986    Years since quitting: 31.6  . Smokeless tobacco: Never Used  Substance and Sexual Activity  . Alcohol use: Yes  . Drug use: No  . Sexual activity: Not on file  Lifestyle  . Physical activity:    Days per week: Not on file    Minutes per session: Not on file  . Stress:  Not on file  Relationships  . Social connections:    Talks on phone: Not on file    Gets together: Not on file    Attends religious service: Not on file    Active member of club or organization: Not on file    Attends meetings of clubs or organizations: Not on file    Relationship status: Not on file  . Intimate partner violence:    Fear of current or ex partner: Not on file    Emotionally abused: Not on file    Physically abused: Not on file    Forced sexual activity: Not on file  Other Topics Concern  . Not on file  Social History Narrative   ** Merged History Encounter **       Patient does not get regular exercise.      Widowed 2004      Disabled    Family History:   Family History  Problem Relation Age of Onset  . Esophageal cancer Brother   . Esophageal cancer Sister        ?  . Colon polyps Brother   . Pancreatic cancer  Sister        ?  . Diabetes Sister   . Diabetes Unknown        Aunt and Uncle  . Heart disease Maternal Grandfather    Family Status:  Family Status  Relation Name Status  . Brother  Alive  . Sister  Alive  . Brother  Alive  . Sister  Alive  . Sister  Alive  . Unknown  (Not Specified)  . MGF  (Not Specified)    ROS:  Please see the history of present illness.  All other ROS reviewed and negative.     Physical Exam/Data:   Vitals:   02/16/18 0641 02/16/18 1543 02/16/18 1931 02/17/18 0457  BP: 140/69 137/76 (!) 148/75 (!) 153/72  Pulse: (!) 110 89 82 75  Resp: 20 16 17 18   Temp: 99.5 F (37.5 C) 99 F (37.2 C) 99.4 F (37.4 C)   TempSrc: Oral Oral Oral Axillary  SpO2: 96% 96% 96% 94%  Weight:      Height:        Intake/Output Summary (Last 24 hours) at 02/17/2018 0954 Last data filed at 02/17/2018 0845 Gross per 24 hour  Intake 120 ml  Output 1950 ml  Net -1830 ml   Filed Weights   02/11/18 1455  Weight: (!) 142 kg   Body mass index is 53.74 kg/m.  General:  Well nourished, well developed, morbidly obese female,  in no acute distress at rest HEENT: normal Lymph: no adenopathy Neck: no JVD seen, difficult to assess secondary to body habitus. Endocrine:  No thryomegaly Vascular: No carotid bruits; 4/4 extremity pulses 2+, without bruits  Cardiac:  normal S1, S2; RRR; soft murmur  Lungs:  clear to auscultation bilaterally, no wheezing, rhonchi or rales  Abd: soft, nontender, no hepatomegaly  Ext: no edema Musculoskeletal:  No deformities, BUE normal and equal, lower extremities extremely weak, right greater than left Skin: warm and dry  Neuro:  CNs 2-12 intact Psych:  Normal affect   EKG:  The EKG was personally reviewed and demonstrates:  SR, HR 78, T wave changes V leads are slightly different from June 2019, but are improved from 02/12/2018.  Unclear significance. Telemetry:  Telemetry was personally reviewed and demonstrates: Sinus rhythm, no significant ectopy  Relevant CV Studies:  ECHO: 02/12/2018 - Left ventricle: The cavity size was normal. Wall thickness was   increased in a pattern of mild LVH. Systolic function was normal.   The estimated ejection fraction was in the range of 55% to 60%.   Wall motion was normal; there were no regional wall motion   abnormalities. The study is not technically sufficient to allow   evaluation of LV diastolic function. - Aortic valve: Mildly to moderately calcified annulus. Moderately   thickened leaflets. Mean gradient (S): 8 mm Hg. Valve area (VTI):   1.79 cm^2. Valve area (Vmax): 1.52 cm^2. - Technically difficult study. Echocontrast was used to enhance   visualization.  ECHO: 08/14/2017 - Procedure narrative: Transthoracic echocardiography. Image   quality was suboptimal. The study was technically difficult, as a   result of poor sound wave transmission and body habitus. - Left ventricle: The cavity size was normal. Wall thickness was   increased in a pattern of moderate LVH. Systolic function was   normal. The estimated ejection fraction was  in the range of 55%   to 60%. Images were inadequate for LV wall motion assessment. No   regional variation on apical 4-chamber views. Doppler parameters  are consistent with abnormal left ventricular relaxation (grade 1   diastolic dysfunction). - Aortic valve: Trileaflet; moderately calcified leaflets. There   was mild to moderate stenosis. There was mild regurgitation. Peak   velocity (S): 244 cm/s. Mean gradient (S): 15 mm Hg. Valve area   (VTI): 1.51 cm^2. Valve area (Vmax): 1.3 cm^2. Valve area   (Vmean): 1.34 cm^2. - Mitral valve: There was mild regurgitation. Valve area by   continuity equation (using LVOT flow): 2.63 cm^2. - Tricuspid valve: There was mild-moderate regurgitation. - Pulmonary arteries: PA peak pressure: 53 mm Hg (S).  LEXISCAN MYOVIEW: 07/14/2015 FINDINGS: Perfusion: No decreased activity in the left ventricle on stress imaging to suggest reversible ischemia or infarction.  Wall Motion: Normal left ventricular wall motion. No left ventricular dilation.  Left Ventricular Ejection Fraction: 57 %  End diastolic volume 614 ml  End systolic volume 45 ml  IMPRESSION: 1. No reversible ischemia or infarction.  2. Normal left ventricular wall motion.  3. Left ventricular ejection fraction 57%  4. Low-risk stress test findings*.  CARDIAC CATH: 03/13/2017  CONCLUSION:  1. No evidence of coronary disease.  2. Low-normal ejection fraction (marked ectopy during the      ventriculogram).  3. It appears that this was a false-positive nuclear study.  I cannot      explain her chest pain from a cardiac standpoint.  She should be      able to go home later today with followup in my office, later in      the week for leg check.  Laboratory Data:  Chemistry Recent Labs  Lab 02/12/18 0125 02/13/18 1431 02/16/18 0538  NA 138 139 139  K 4.7 4.8 4.5  CL 101 102 101  CO2 26 26 28   GLUCOSE 322* 352* 240*  BUN 39* 23 26*  CREATININE 1.51* 1.32*  1.10*  CALCIUM 9.1 9.5 9.6  GFRNONAA 34* 40* 50*  GFRAA 40* 47* 58*  ANIONGAP 11 11 10     Lab Results  Component Value Date   ALT 18 02/12/2018   AST 15 02/12/2018   ALKPHOS 61 02/12/2018   BILITOT 0.4 02/12/2018   Hematology Recent Labs  Lab 02/11/18 1541 02/12/18 0125 02/16/18 0538  WBC 10.4 10.5 9.9  RBC 4.26 4.15 4.06  HGB 12.0 11.8* 11.5*  HCT 38.3 37.5 36.3  MCV 89.9 90.4 89.4  MCH 28.2 28.4 28.3  MCHC 31.3 31.5 31.7  RDW 14.7 14.7 15.2  PLT 279 220 227   Cardiac Enzymes Recent Labs  Lab 02/11/18 2039 02/12/18 0125 02/12/18 0733 02/12/18 1454  TROPONINI <0.03 0.03* 0.05* 0.04*    Recent Labs  Lab 02/11/18 1559  TROPIPOC 0.00     TSH:  Lab Results  Component Value Date   TSH 0.828 02/12/2018   Lipids: Lab Results  Component Value Date   CHOL 171 10/06/2013   HDL 45.80 10/06/2013   LDLCALC 102 (H) 10/06/2013   TRIG 116.0 10/06/2013   CHOLHDL 4 10/06/2013   HgbA1c: Lab Results  Component Value Date   HGBA1C 8.5 (H) 02/12/2018   Magnesium:  Magnesium  Date Value Ref Range Status  02/12/2018 1.8 1.7 - 2.4 mg/dL Final    Comment:    Performed at [facility], [place] [address], [place], [place] [address]     Radiology/Studies:  Mr Cervical Spine Wo Contrast  Result Date: 02/14/2018 CLINICAL DATA:  Weakness and multiple falls EXAM: MRI CERVICAL AND THORACIC SPINE WITHOUT CONTRAST TECHNIQUE: Multiplanar and multiecho  pulse sequences of the cervical and thoracic spine were obtained without intravenous contrast. COMPARISON:  None. FINDINGS: MRI CERVICAL SPINE FINDINGS Alignment: Physiologic. Vertebrae: There is ACDF hardware extending from C3-C7. Cord: Normal signal and morphology. Posterior Fossa, vertebral arteries, paraspinal tissues: Negative. Disc levels: C2-C3: Small central disc protrusion and mild bilateral facet hypertrophy. No spinal canal stenosis. Mild right foraminal stenosis. C3-C4: Postfusion changes without  stenosis. C4-C5: Postfusion changes without stenosis. C5-C6: Postfusion changes without stenosis. C6-C7: Postfusion changes without stenosis. C7-T1: No spinal canal stenosis. Mild narrowing of the right neural foramen. MRI THORACIC SPINE FINDINGS The due to patient body habitus, axial images were not able to be obtained below the C7-T1 level. Alignment: Normal Vertebrae: No fracture, evidence of discitis, or bone lesion. Cord: There is focal hyperintense T2-weighted signal within the spinal cord at the T1-T2 level. The remainder of the spinal cord is normal. Paraspinal and other soft tissues: Negative. Disc levels: Assessment of disc spaces is limited due to the lack of axial imaging. There is severe spinal canal stenosis at the T1-T2 level, predominantly due to posterior osseous spurring. There is no other high-grade spinal canal stenosis. There are small disc herniations at T9-10 and T10-11. IMPRESSION: 1. Examination limited by patient body habitus. Axial images of the thoracic spine were not able to be obtained. 2. Severe spinal canal stenosis at T1-T2, predominantly caused by posterior osseous spurring. Signal changes within the spinal cord at this level likely indicate myelomalacia. 3. No high-grade cervical stenosis. Electronically Signed   By: Ulyses Jarred M.D.   On: 02/14/2018 19:47   Mr Thoracic Spine Wo Contrast  Result Date: 02/15/2018 CLINICAL DATA:  Follow-up upper thoracic spinal stenosis. History of ACDF. EXAM: MRI THORACIC SPINE WITHOUT CONTRAST TECHNIQUE: Axial, multisequence MR imaging of the thoracic spine was performed. No intravenous contrast was administered. COMPARISON:  MRI cervical and thoracic spine February 14, 2018 FINDINGS: Axial sequences through upper thoracic spine. Severe canal stenosis T1-2, AP dimension of the canal is 5 mm, low signal RIGHT posterolateral epidural space most compatible with calcified ligamentum flavum. Cervical spinal cord deformity without definite edema. No  syrinx. Mild canal stenosis T3-4. Nondiagnostic assessment of neural foraminal narrowing on axial sequences. IMPRESSION: 1. Limited axial series through upper thoracic spine. 2. Severe canal stenosis at T1-2 with cord compression without definite cord edema. Electronically Signed   By: Elon Alas M.D.   On: 02/15/2018 18:54   Mr Thoracic Spine Wo Contrast  Result Date: 02/14/2018 CLINICAL DATA:  Weakness and multiple falls EXAM: MRI CERVICAL AND THORACIC SPINE WITHOUT CONTRAST TECHNIQUE: Multiplanar and multiecho pulse sequences of the cervical and thoracic spine were obtained without intravenous contrast. COMPARISON:  None. FINDINGS: MRI CERVICAL SPINE FINDINGS Alignment: Physiologic. Vertebrae: There is ACDF hardware extending from C3-C7. Cord: Normal signal and morphology. Posterior Fossa, vertebral arteries, paraspinal tissues: Negative. Disc levels: C2-C3: Small central disc protrusion and mild bilateral facet hypertrophy. No spinal canal stenosis. Mild right foraminal stenosis. C3-C4: Postfusion changes without stenosis. C4-C5: Postfusion changes without stenosis. C5-C6: Postfusion changes without stenosis. C6-C7: Postfusion changes without stenosis. C7-T1: No spinal canal stenosis. Mild narrowing of the right neural foramen. MRI THORACIC SPINE FINDINGS The due to patient body habitus, axial images were not able to be obtained below the C7-T1 level. Alignment: Normal Vertebrae: No fracture, evidence of discitis, or bone lesion. Cord: There is focal hyperintense T2-weighted signal within the spinal cord at the T1-T2 level. The remainder of the spinal cord is normal. Paraspinal and other soft tissues:  Negative. Disc levels: Assessment of disc spaces is limited due to the lack of axial imaging. There is severe spinal canal stenosis at the T1-T2 level, predominantly due to posterior osseous spurring. There is no other high-grade spinal canal stenosis. There are small disc herniations at T9-10 and  T10-11. IMPRESSION: 1. Examination limited by patient body habitus. Axial images of the thoracic spine were not able to be obtained. 2. Severe spinal canal stenosis at T1-T2, predominantly caused by posterior osseous spurring. Signal changes within the spinal cord at this level likely indicate myelomalacia. 3. No high-grade cervical stenosis. Electronically Signed   By: Ulyses Jarred M.D.   On: 02/14/2018 19:47    Assessment and Plan:   1.  Preop cardiovascular exam: -She is nonambulatory and has a very low activity level - She has multiple cardiac risk factors, but is not having any discernible ischemic symptoms. - Stress test in 2016 was low risk with normal perfusion and a normal EF - Echocardiogram this admission shows a normal EF with no wall motion abnormalities -Her RCRI is 0, perioperative risk of major cardiac events is 0.4%. - She is at acceptable risk for the planned procedure from a cardiac standpoint without additional testing.  Otherwise, per IM Active Problems:   Morbid obesity ([place])   ANEMIA-NOS   Essential hypertension   GERD   Sleep apnea   Gout   Weakness generalized   Obstructive sleep apnea   DM neuropathy, type II diabetes mellitus (HCC)   Dehydration   AKI (acute kidney injury) ([place])   Hypotension   Syncope, vasovagal   Syncope     For questions or updates, please contact [facility] Please consult [email or web address] for contact info under Cardiology/STEMI.   Signed, Rosaria Ferries, PA-C  02/17/2018 9:55 AM   Pt seen and examined   I have amended note above by R Barrett to reflect my findings. PT is a 67 yo who is being eval for back surgery     Long history of CP (since 2006)   Has not changed    She is not mobile   CP occurs at rest   Substernal then to R side of chest    No Change in quality since started    Myovue stress test in 2017 showed no ischemia   LVEF normal   Pt currently iwthout pain She is in bed  Morbidly obese Neck   No bruits    Fulll  Lungs are clear anteriorly    Cardiac exam:   RRr   No S3    Chest  Non tender Abd is bening   Ext are without edema    From a cardiac standpoint I think she is at rel low risk for major cardac event with upcoming surgery and OK to proceed without furhter testing   Follow BP, HR and I/Os     Will continue to follow with you  Dorris Carnes

## 2018-02-17 NOTE — Progress Notes (Signed)
Inpatient Diabetes Program Recommendations  AACE/ADA: New Consensus Statement on Inpatient Glycemic Control (2015)  Target Ranges:  Prepandial:   less than 140 mg/dL      Peak postprandial:   less than 180 mg/dL (1-2 hours)      Critically ill patients:  140 - 180 mg/dL   Lab Results  Component Value Date   GLUCAP 183 (H) 02/17/2018   HGBA1C 8.5 (H) 02/12/2018    Review of Glycemic Control Results for Christy May, Christy May (MRN [id number]) as of 02/17/2018 11:54  Ref. Range 02/16/2018 12:10 02/16/2018 16:44 02/16/2018 21:30 02/17/2018 07:15 02/17/2018 11:05  Glucose-Capillary Latest Ref Range: 70 - 99 mg/dL 345 (H) 382 (H) 318 (H) 316 (H) 183 (H)   Diabetes history: Type 2 DM Outpatient Diabetes medications: NPH 120 units QAM, Metformin 500 mg BID Current orders for Inpatient glycemic control: NPH 35 units BID, Novolog 0-20 units TID, Novolog 0-5 units QHS, Solumedrol 40 mg Q12H  Inpatient Diabetes Program Recommendations:    Noted changes made to NPH in preparation for surgery. In the event BS exceed 200 mg/dL, would recommend placing orders for IV insulin and transition to SQ when appropriate.   Thanks, Bronson Curb, MSN, RNC-OB Diabetes Coordinator [phone] (8a-5p)

## 2018-02-17 NOTE — NC FL2 (Signed)
[place] MEDICAID FL2 LEVEL OF CARE SCREENING TOOL     IDENTIFICATION  Patient Name: Christy May Birthdate: 1951-05-18 Sex: female Admission Date (Current Location): 02/11/2018  [facility] and [place] Number:  Herbalist and Address:  [facility] [place], [place] [address] [place],  [address]      Provider Number: [id number]  Attending Physician Name and Address:  Elmarie Shiley, MD  Relative Name and Phone Number:       Current Level of Care: Hospital Recommended Level of Care: [place] Prior Approval Number:    Date Approved/Denied:   PASRR Number: [id number]  Discharge Plan: SNF    Current Diagnoses: Patient Active Problem List   Diagnosis Date Noted  . Dehydration 02/11/2018  . AKI (acute kidney injury) ([place]) 02/11/2018  . Hypotension 02/11/2018  . Syncope, vasovagal 02/11/2018  . Syncope 02/11/2018  . DM neuropathy, type II diabetes mellitus ([place]) 11/18/2017  . Weakness 11/18/2017  . SIRS (systemic inflammatory response syndrome) ([place]) 11/17/2017  . Diabetes mellitus type 2 in obese ([place]) 11/17/2017  . Acute lower UTI 11/17/2017  . Septic shock ([place]) 08/30/2017  . Acute renal failure (ARF) ([place]) 08/13/2017  . Pressure injury of skin 08/13/2017  . Sepsis due to gram-negative UTI ([place]) 08/12/2017  . Upper airway cough syndrome 09/28/2016  . Acute bronchitis 10/29/2015  . Diabetes ([place]) 08/21/2015  . Chest pain 07/14/2015  . Dyspnea on exertion 07/05/2015  . Obstructive sleep apnea 07/05/2015  . Constipation 03/05/2015  . Allergic rhinitis 03/05/2015  . Hearing loss 03/05/2015  . Vocal cord dysfunction 03/05/2015  . Pain in joint, shoulder region 09/11/2014  . Weakness generalized 09/02/2014  . Acute esophagitis 05/08/2014  . Benign neoplasm of sigmoid colon 05/08/2014  . Change in bowel habits 04/17/2014  . Fecal incontinence 04/17/2014  . Urge incontinence 04/08/2014  . Cough 12/06/2013  .  Orthostasis 07/18/2013  . Acute kidney injury ([place]) 07/18/2013  . Bilateral carpal tunnel syndrome 02/14/2013  . Gout 01/04/2013  . Failure to thrive 06/28/2012  . Gait disorder 06/28/2012  . Lower abdominal pain 12/14/2011  . Preventative health care 08/23/2011  . Cervical radiculopathy 02/11/2011  . Low back pain 02/11/2011  . VITAMIN D DEFICIENCY 11/22/2009  . ANEMIA-NOS 11/22/2009  . DEGENERATIVE JOINT DISEASE, CERVICAL SPINE 11/22/2009  . Depression 09/16/2009  . POLYNEUROPATHY 09/16/2009  . Morbid obesity ([place]) 09/09/2009  . Essential hypertension 09/09/2009  . GERD 09/09/2009  . Sleep apnea 09/09/2009    Orientation RESPIRATION BLADDER Height & Weight     Time, Self, Situation, Place  Other (Comment)(CPAP) Incontinent, External catheter Weight: (!) 313 lb 0.9 oz (142 kg) Height:  5\' 4"  (162.6 cm)  BEHAVIORAL SYMPTOMS/MOOD NEUROLOGICAL BOWEL NUTRITION STATUS      Continent Diet(See discharge summary)  AMBULATORY STATUS COMMUNICATION OF NEEDS Skin   Extensive Assist Verbally PU Stage and Appropriate Care(PU stage 2 buttocks)                       Personal Care Assistance Level of Assistance  Bathing, Dressing, Feeding Bathing Assistance: Maximum assistance Feeding assistance: Independent Dressing Assistance: Maximum assistance     Functional Limitations Info  Sight, Hearing, Speech Sight Info: Adequate Hearing Info: Adequate Speech Info: Adequate    SPECIAL CARE FACTORS FREQUENCY  PT (By licensed PT), OT (By licensed OT)     PT Frequency: 5x per week OT Frequency: 5x per week  Contractures Contractures Info: Not present    Additional Factors Info  Code Status, Allergies, Insulin Sliding Scale Code Status Info: Full Allergies Info: Ace Inhibitors, Penicillins, Adhesive Tape   Insulin Sliding Scale Info: Novolog 3x per day with meals and at bedtime       Current Medications (02/17/2018):  This is the current hospital active  medication list Current Facility-Administered Medications  Medication Dose Route Frequency Provider Last Rate Last Dose  . acetaminophen (TYLENOL) tablet 650 mg  650 mg Oral Q6H PRN Nita Sells, MD       Or  . acetaminophen (TYLENOL) suppository 650 mg  650 mg Rectal Q6H PRN Samtani, Jai-Gurmukh, MD      . albuterol (PROVENTIL) (2.5 MG/3ML) 0.083% nebulizer solution 2.5 mg  2.5 mg Nebulization Q2H PRN Nita Sells, MD      . allopurinol (ZYLOPRIM) tablet 300 mg  300 mg Oral Daily Nita Sells, MD   300 mg at 02/17/18 0911  . Chlorhexidine Gluconate Cloth 2 % PADS 6 each  6 each Topical Once Newman Pies, MD      . cyclobenzaprine (FLEXERIL) tablet 10 mg  10 mg Oral Daily Nita Sells, MD   10 mg at 02/17/18 0910  . cycloSPORINE (RESTASIS) 0.05 % ophthalmic emulsion 1 drop  1 drop Both Eyes BID Nita Sells, MD   1 drop at 02/17/18 0911  . fluticasone (FLONASE) 50 MCG/ACT nasal spray 2 spray  2 spray Each Nare Daily Samtani, Jai-Gurmukh, MD      . folic acid (FOLVITE) tablet 1 mg  1 mg Oral Daily Nita Sells, MD   1 mg at 02/17/18 0910  . gabapentin (NEURONTIN) capsule 800 mg  800 mg Oral TID Nita Sells, MD   800 mg at 02/17/18 0909  . Gerhardt's butt cream   Topical QID Nita Sells, MD   1 application at 89/21/19 2221  . HYDROcodone-acetaminophen (NORCO/VICODIN) 5-325 MG per tablet 2 tablet  2 tablet Oral TID Nita Sells, MD   2 tablet at 02/17/18 0910  . insulin aspart (novoLOG) injection 0-20 Units  0-20 Units Subcutaneous TID WC Nita Sells, MD   15 Units at 02/17/18 0909  . insulin aspart (novoLOG) injection 0-5 Units  0-5 Units Subcutaneous QHS Nita Sells, MD   4 Units at 02/16/18 2236  . insulin NPH Human (HUMULIN N,NOVOLIN N) injection 35 Units  35 Units Subcutaneous BID AC & HS Regalado, Belkys A, MD      . ipratropium (ATROVENT) 0.06 % nasal spray 2 spray  2 spray Each Nare BID Nita Sells, MD   2 spray at 02/17/18 1122  . lidocaine (LIDODERM) 5 % 1 patch  1 patch Transdermal Q24H Nita Sells, MD   1 patch at 02/16/18 1625  . loratadine (CLARITIN) tablet 10 mg  10 mg Oral Daily Nita Sells, MD   10 mg at 02/17/18 0910  . meclizine (ANTIVERT) tablet 25 mg  25 mg Oral TID PRN Nita Sells, MD   25 mg at 02/16/18 2223  . methylPREDNISolone sodium succinate (SOLU-MEDROL) 40 mg/mL injection 40 mg  40 mg Intravenous Q12H Nita Sells, MD   40 mg at 02/17/18 0938  . metoprolol tartrate (LOPRESSOR) tablet 75 mg  75 mg Oral BID Nita Sells, MD   75 mg at 02/17/18 0910  . montelukast (SINGULAIR) tablet 10 mg  10 mg Oral QHS Nita Sells, MD   10 mg at 02/16/18 2110  . mupirocin ointment (BACTROBAN) 2 % 1 application  1 application Nasal  BID Nita Sells, MD   1 application at 80/16/55 [phone]  . ondansetron (ZOFRAN) tablet 4 mg  4 mg Oral Q6H PRN Nita Sells, MD   4 mg at 02/11/18 2248   Or  . ondansetron (ZOFRAN) injection 4 mg  4 mg Intravenous Q6H PRN Nita Sells, MD      . oxybutynin (DITROPAN-XL) 24 hr tablet 10 mg  10 mg Oral Daily Nita Sells, MD   10 mg at 02/17/18 0909  . pantoprazole (PROTONIX) EC tablet 40 mg  40 mg Oral BID Nita Sells, MD   40 mg at 02/17/18 0910  . tetrahydrozoline 0.05 % ophthalmic solution 1 drop  1 drop Both Eyes BID Nita Sells, MD   1 drop at 02/17/18 0914  . vancomycin (VANCOCIN) IVPB 1000 mg/200 mL premix  1,000 mg Intravenous To Sherri Rad, MD         Discharge Medications: Please see discharge summary for a list of discharge medications.  Relevant Imaging Results:  Relevant Lab Results:   Additional Information SSN: [id number]  Estanislado Emms, LCSW

## 2018-02-17 NOTE — Anesthesia Preprocedure Evaluation (Addendum)
Anesthesia Evaluation  Patient identified by MRN, date of birth, ID band Patient awake    Reviewed: Allergy & Precautions, H&P , NPO status , Patient's Chart, lab work & pertinent test results, reviewed documented beta blocker date and time   History of Anesthesia Complications (+) Family history of anesthesia reaction and history of anesthetic complications  Airway Mallampati: III  TM Distance: >3 FB Neck ROM: full    Dental no notable dental hx.    Pulmonary asthma , sleep apnea , COPD,  COPD inhaler, former smoker,    Pulmonary exam normal breath sounds clear to auscultation       Cardiovascular hypertension, Pt. on medications and Pt. on home beta blockers Normal cardiovascular exam+ Valvular Problems/Murmurs  Rhythm:regular Rate:Normal  Echo 02/12/18 Left ventricle: The cavity size was normal. Wall thickness was increased in a pattern of mild LVH. Systolic function was normal. The estimated ejection fraction was in the range of 55% to 60%. Wall motion was normal; there were no regional wall motion abnormalities. The study is not technically sufficient to allow evaluation of LV diastolic function. - Aortic valve: Mildly to moderately calcified annulus. Moderately thickened leaflets. Mean gradient (S): 8 mm Hg. Valve area (VTI): 1.79 cm^2. Valve area (Vmax): 1.52 cm^2. - Technically difficult study. Echocontrast was used to enhance visualization.   Neuro/Psych  Headaches, PSYCHIATRIC DISORDERS Anxiety Depression  Neuromuscular disease    GI/Hepatic Neg liver ROS, hiatal hernia, GERD  ,  Endo/Other  diabetes, Well Controlled, Type 2, Insulin DependentMorbid obesity  Renal/GU ARF and Renal InsufficiencyRenal disease     Musculoskeletal  (+) Arthritis ,   Abdominal (+) + obese,   Peds  Hematology  (+) anemia ,   Anesthesia Other Findings   Reproductive/Obstetrics negative OB ROS                             Anesthesia Physical  Anesthesia Plan  ASA: IV  Anesthesia Plan: General   Post-op Pain Management:    Induction: Intravenous  PONV Risk Score and Plan: 3 and Ondansetron, Dexamethasone and Treatment may vary due to age or medical condition  Airway Management Planned: Oral ETT  Additional Equipment:   Intra-op Plan:   Post-operative Plan: Extubation in OR  Informed Consent: I have reviewed the patients History and Physical, chart, labs and discussed the procedure including the risks, benefits and alternatives for the proposed anesthesia with the patient or authorized representative who has indicated his/her understanding and acceptance.   Dental Advisory Given  Plan Discussed with: CRNA  Anesthesia Plan Comments: (+/- aline )      Anesthesia Quick Evaluation

## 2018-02-17 NOTE — Anesthesia Procedure Notes (Signed)
Procedure Name: Intubation Date/Time: 02/17/2018 3:28 PM Performed by: Marsa Aris, CRNA Pre-anesthesia Checklist: Patient identified, Emergency Drugs available, Suction available, Patient being monitored and Timeout performed Patient Re-evaluated:Patient Re-evaluated prior to induction Oxygen Delivery Method: Circle System Utilized Preoxygenation: Pre-oxygenation with 100% oxygen Induction Type: IV induction Ventilation: Mask ventilation without difficulty Laryngoscope Size: Glidescope and 4 Grade View: Grade I Tube type: Oral Tube size: 7.5 mm Number of attempts: 1 Airway Equipment and Method: Stylet and Oral airway Placement Confirmation: ETT inserted through vocal cords under direct vision,  positive ETCO2 and breath sounds checked- equal and bilateral Secured at: 21 cm Tube secured with: Tape Dental Injury: Teeth and Oropharynx as per pre-operative assessment  Comments: C-spine neutrality maintained - no change in dentition from pre-procedure.

## 2018-02-17 NOTE — Progress Notes (Signed)
CPAP setup for HS.  Patient not ready at this time but is able to apply mask herself.  Patient is familiar with equipment and procedure and is compliant with therapy at home.  5 cmH20 used as per patient home settings.  Room air.  Patient instructed to notify RN or RT with any needs or concerns.

## 2018-02-17 NOTE — Transfer of Care (Signed)
Immediate Anesthesia Transfer of Care Note  Patient: Christy May  Procedure(s) Performed: THORACIC ONE - THORACIC TWO LAMINECTOMY (N/A Spine Thoracic)  Patient Location: PACU  Anesthesia Type:General  Level of Consciousness: awake, alert  and oriented  Airway & Oxygen Therapy: Patient Spontanous Breathing and Patient connected to nasal cannula oxygen  Post-op Assessment: Report given to RN, Post -op Vital signs reviewed and stable and Patient moving all extremities X 4  Post vital signs: Reviewed and stable  Last Vitals:  Vitals Value Taken Time  BP 135/65 02/17/2018  6:11 PM  Temp    Pulse 81 02/17/2018  6:18 PM  Resp 12 02/17/2018  6:18 PM  SpO2 96 % 02/17/2018  6:18 PM  Vitals shown include unvalidated device data.  Last Pain:  Vitals:   02/17/18 0910  TempSrc:   PainSc: 8       Patients Stated Pain Goal: 3 (33/83/29 1916)  Complications: No apparent anesthesia complications

## 2018-02-17 NOTE — Plan of Care (Signed)
  Problem: Safety: Goal: Ability to remain free from injury will improve Outcome: Progressing   Problem: Skin Integrity: Goal: Risk for impaired skin integrity will decrease Outcome: Progressing   Problem: Clinical Measurements: Goal: Ability to maintain clinical measurements within normal limits will improve Outcome: Progressing   Problem: Coping: Goal: Level of anxiety will decrease Outcome: Progressing   Problem: Elimination: Goal: Will not experience complications related to bowel motility Outcome: Progressing   Problem: Pain Managment: Goal: General experience of comfort will improve Outcome: Progressing

## 2018-02-17 NOTE — Progress Notes (Signed)
Subjective: The patient is alert and pleasant.  She is in no apparent distress.  She looks well.  Objective: Vital signs in last 24 hours: Temp:  [99.4 F (37.4 C)] 99.4 F (37.4 C) (08/28 1931) Pulse Rate:  [75-82] 80 (08/29 1810) Resp:  [13-18] 13 (08/29 1810) BP: (135-153)/(65-75) 135/65 (08/29 1810) SpO2:  [94 %-100 %] 100 % (08/29 1810) Estimated body mass index is 53.74 kg/m as calculated from the following:   Height as of this encounter: 5\' 4"  (1.626 m).   Weight as of this encounter: 142 kg.   Intake/Output from previous day: 08/28 0701 - 08/29 0700 In: 360 [P.O.:360] Out: 2150 [Urine:2150] Intake/Output this shift: Total I/O In: 1200 [I.V.:1200] Out: 150 [Blood:150]  Physical exam the patient is alert and pleasant.  She is moving her lower extremities without change from her preoperative exam.  She is weaker in her right leg.  Lab Results: Recent Labs    02/16/18 0538  WBC 9.9  HGB 11.5*  HCT 36.3  PLT 227   BMET Recent Labs    02/16/18 0538 02/17/18 0951  NA 139 139  K 4.5 4.2  CL 101 100  CO2 28 28  GLUCOSE 240* 271*  BUN 26* 25*  CREATININE 1.10* 1.08*  CALCIUM 9.6 9.9    Studies/Results: Mr Thoracic Spine Wo Contrast  Result Date: 02/15/2018 CLINICAL DATA:  Follow-up upper thoracic spinal stenosis. History of ACDF. EXAM: MRI THORACIC SPINE WITHOUT CONTRAST TECHNIQUE: Axial, multisequence MR imaging of the thoracic spine was performed. No intravenous contrast was administered. COMPARISON:  MRI cervical and thoracic spine February 14, 2018 FINDINGS: Axial sequences through upper thoracic spine. Severe canal stenosis T1-2, AP dimension of the canal is 5 mm, low signal RIGHT posterolateral epidural space most compatible with calcified ligamentum flavum. Cervical spinal cord deformity without definite edema. No syrinx. Mild canal stenosis T3-4. Nondiagnostic assessment of neural foraminal narrowing on axial sequences. IMPRESSION: 1. Limited axial series  through upper thoracic spine. 2. Severe canal stenosis at T1-2 with cord compression without definite cord edema. Electronically Signed   By: Elon Alas M.D.   On: 02/15/2018 18:54   Dg Thoracic Spine 1 View  Result Date: 02/17/2018 CLINICAL DATA:  Intraoperative localization for laminectomy EXAM: THORACIC SPINE - 1 VIEW COMPARISON:  None. FLUOROSCOPY TIME:  Radiation Exposure Index (as provided by the fluoroscopic device): Not available If the device does not provide the exposure index: Fluoroscopy Time:  Not available Number of Acquired Images:  1 FINDINGS: Frontal film over the upper thoracic spine demonstrates a curved instrument overlying the T1-2 interspace. The anatomy is based on the inferior aspect of a prior cervical fusion plate extending to T7. No lateral imaging was obtained. IMPRESSION: Intraoperative localization at T1-2. These findings were called to OR 20 at the time of exam interpretation. Electronically Signed   By: Inez Catalina M.D.   On: 02/17/2018 16:50    Assessment/Plan: The patient is doing well.  There is no family members available.  LOS: 4 days     Christy May 02/17/2018, 6:25 PM

## 2018-02-17 NOTE — Op Note (Signed)
Brief history: The patient is a 67 year old black female on whom I performed a C3-C7 anterior cervical discectomy, fusion and plating many years ago.  The patient has developed a progressive dense paraparesis.  She was worked up with a thoracic MRI which demonstrated severe spinal stenosis at T1-2.  I discussed the various treatment options with the patient including surgery.  She has weighed the risks, benefits, and alternatives of surgery and decided proceed with a thoracic laminectomy.  Preop diagnosis: Thoracic spinal stenosis, thoracic myelopathy, paraparesis  Postop diagnosis: The same  Procedure: C7-T1 and T1-2 laminectomy  Surgeon: Dr. Earle Gell  Assistant: Dr. Kathyrn Sheriff  Anesthesia: General tracheal  Estimated blood loss: 125 cc  Specimens: None  Drains: One medium epidural Hemovac drain  Complications: None  Description of procedure: The patient was brought to the operating room by the anesthesia team.  General endotracheal anesthesia was induced.  I applied the [place] three-point headrest to the patient's calvarium.  She was carefully turned to the prone position on the chest rolls.  The patient's thoracic region was then prepared with Betadine scrub and Betadine solution.  Sterile drapes were applied.  I then injected the area to be incised with Marcaine with epinephrine solution.  Ice to scalpel to make a linear midline incision over the C7-T1 and T1-2 interspaces.  I use of electrocautery to incise the thoracic fascia.  I performed a bilateral subperiosteal dissection exposing the spinous process and lamina of C7-T1 and T2.  We inserted the [facility] retractor for exposure.  We used intraoperative fluoroscopy to confirm our location.  We began the decompression by incising the interspinous ligament at T1-2, T1-C7 and C6-7 with electrocautery.  I used a [place] rongeur to remove the spinous process of C7, T1 and T2.  I used a high-speed drill to thin the lamina at T2, T1  and C7.  I completed the laminectomy at C7, T1 and T2 with the Kerrison punches.  I used the Kerrison punches to remove the ligamentum flavum at C6-7 C7-T1 and T1-2.  We encountered what appeared to be a large synovial cyst at T1-2 on the right.  We carefully dissected it free from the dura and removed it with the micro-Kerrison punches decompressing the thecal sac.  After we are satisfied with our decompression we obtained hemostasis using bipolar electrocautery and Surgifoam.  We irrigated when that with bacitracin solution.  We removed the retractors.  I placed a medium Hemovac drain in the epidural space and tunneled it out through a separate stab wound.  We then reapproximated the patient's thoracic fascia with interrupted 0 Vicryl suture.  We reapproximated the subcutaneous tissue with interrupted 2-0 Vicryl suture.  We reapproximate the skin with Steri-Strips and benzoin.  Sterile dressings were applied.  The drapes were removed.  The patient was then returned to the supine position.  I then removed the [place] three-point head rest from her calvarium.  This completed the procedure.  By report all sponge, instrument, and needle counts were correct at the end this case.

## 2018-02-17 NOTE — Progress Notes (Signed)
Subjective: The patient is alert and pleasant.  She is ready for surgery.  Objective: Vital signs in last 24 hours: Temp:  [99 F (37.2 C)-99.4 F (37.4 C)] 99.4 F (37.4 C) (08/28 1931) Pulse Rate:  [75-89] 75 (08/29 0457) Resp:  [16-18] 18 (08/29 0457) BP: (137-153)/(72-76) 153/72 (08/29 0457) SpO2:  [94 %-96 %] 94 % (08/29 0457) Estimated body mass index is 53.74 kg/m as calculated from the following:   Height as of this encounter: 5\' 4"  (1.626 m).   Weight as of this encounter: 142 kg.   Intake/Output from previous day: 08/28 0701 - 08/29 0700 In: 360 [P.O.:360] Out: 2150 [Urine:2150] Intake/Output this shift: No intake/output data recorded.  Physical exam the patient is alert and pleasant.  She is densely paraparetic without change.  Lab Results: Recent Labs    02/16/18 0538  WBC 9.9  HGB 11.5*  HCT 36.3  PLT 227   BMET Recent Labs    02/16/18 0538 02/17/18 0951  NA 139 139  K 4.5 4.2  CL 101 100  CO2 28 28  GLUCOSE 240* 271*  BUN 26* 25*  CREATININE 1.10* 1.08*  CALCIUM 9.6 9.9    Studies/Results: Mr Thoracic Spine Wo Contrast  Result Date: 02/15/2018 CLINICAL DATA:  Follow-up upper thoracic spinal stenosis. History of ACDF. EXAM: MRI THORACIC SPINE WITHOUT CONTRAST TECHNIQUE: Axial, multisequence MR imaging of the thoracic spine was performed. No intravenous contrast was administered. COMPARISON:  MRI cervical and thoracic spine February 14, 2018 FINDINGS: Axial sequences through upper thoracic spine. Severe canal stenosis T1-2, AP dimension of the canal is 5 mm, low signal RIGHT posterolateral epidural space most compatible with calcified ligamentum flavum. Cervical spinal cord deformity without definite edema. No syrinx. Mild canal stenosis T3-4. Nondiagnostic assessment of neural foraminal narrowing on axial sequences. IMPRESSION: 1. Limited axial series through upper thoracic spine. 2. Severe canal stenosis at T1-2 with cord compression without definite  cord edema. Electronically Signed   By: Elon Alas M.D.   On: 02/15/2018 18:54    Assessment/Plan: Thoracic stenosis, thoracic myelopathy, paraparesis: I have discussed situation with patient.  I have answered all questions regarding surgery.  She wants to proceed with a thoracic laminectomy.  LOS: 4 days     Christy May 02/17/2018, 2:33 PM

## 2018-02-18 ENCOUNTER — Encounter (HOSPITAL_COMMUNITY): Payer: Self-pay | Admitting: Neurosurgery

## 2018-02-18 LAB — COMPREHENSIVE METABOLIC PANEL
ALBUMIN: 2.7 g/dL — AB (ref 3.5–5.0)
ALT: 22 U/L (ref 0–44)
ANION GAP: 7 (ref 5–15)
AST: 17 U/L (ref 15–41)
Alkaline Phosphatase: 52 U/L (ref 38–126)
BUN: 22 mg/dL (ref 8–23)
CHLORIDE: 103 mmol/L (ref 98–111)
CO2: 29 mmol/L (ref 22–32)
CREATININE: 0.97 mg/dL (ref 0.44–1.00)
Calcium: 9.6 mg/dL (ref 8.9–10.3)
GFR calc non Af Amer: 59 mL/min — ABNORMAL LOW (ref >60)
GLUCOSE: 157 mg/dL — AB (ref 70–99)
Potassium: 4.7 mmol/L (ref 3.5–5.1)
SODIUM: 139 mmol/L (ref 135–145)
Total Bilirubin: 0.5 mg/dL (ref 0.3–1.2)
Total Protein: 6 g/dL — ABNORMAL LOW (ref 6.5–8.1)

## 2018-02-18 LAB — CBC WITH DIFFERENTIAL/PLATELET
ABS IMMATURE GRANULOCYTES: 0.1 10*3/uL (ref 0.0–0.1)
BASOS ABS: 0 10*3/uL (ref 0.0–0.1)
Basophils Relative: 0 %
EOS PCT: 0 %
Eosinophils Absolute: 0 10*3/uL (ref 0.0–0.7)
HEMATOCRIT: 34.3 % — AB (ref 36.0–46.0)
HEMOGLOBIN: 10.5 g/dL — AB (ref 12.0–15.0)
Immature Granulocytes: 1 %
LYMPHS ABS: 1.7 10*3/uL (ref 0.7–4.0)
Lymphocytes Relative: 12 %
MCH: 28 pg (ref 26.0–34.0)
MCHC: 30.6 g/dL (ref 30.0–36.0)
MCV: 91.5 fL (ref 78.0–100.0)
MONO ABS: 1 10*3/uL (ref 0.1–1.0)
MONOS PCT: 7 %
NEUTROS ABS: 11.5 10*3/uL — AB (ref 1.7–7.7)
Neutrophils Relative %: 80 %
Platelets: 212 10*3/uL (ref 150–400)
RBC: 3.75 MIL/uL — ABNORMAL LOW (ref 3.87–5.11)
RDW: 15 % (ref 11.5–15.5)
WBC: 14.3 10*3/uL — ABNORMAL HIGH (ref 4.0–10.5)

## 2018-02-18 LAB — GLUCOSE, CAPILLARY
GLUCOSE-CAPILLARY: 122 mg/dL — AB (ref 70–99)
GLUCOSE-CAPILLARY: 182 mg/dL — AB (ref 70–99)
GLUCOSE-CAPILLARY: 277 mg/dL — AB (ref 70–99)
Glucose-Capillary: 329 mg/dL — ABNORMAL HIGH (ref 70–99)

## 2018-02-18 MED ORDER — INSULIN NPH (HUMAN) (ISOPHANE) 100 UNIT/ML ~~LOC~~ SUSP
35.0000 [IU] | Freq: Two times a day (BID) | SUBCUTANEOUS | Status: DC
  Administered 2018-02-18 – 2018-02-22 (×9): 35 [IU] via SUBCUTANEOUS
  Filled 2018-02-18: qty 10

## 2018-02-18 MED ORDER — INSULIN NPH (HUMAN) (ISOPHANE) 100 UNIT/ML ~~LOC~~ SUSP: 60.0000 [IU] | Freq: Two times a day (BID) | SUBCUTANEOUS | Status: DC

## 2018-02-18 NOTE — Anesthesia Postprocedure Evaluation (Signed)
Anesthesia Post Note  Patient: Christy May  Procedure(s) Performed: THORACIC ONE - THORACIC TWO LAMINECTOMY (N/A Spine Thoracic)     Patient location during evaluation: PACU Anesthesia Type: General Level of consciousness: sedated and patient cooperative Pain management: pain level controlled Vital Signs Assessment: post-procedure vital signs reviewed and stable Respiratory status: spontaneous breathing Cardiovascular status: stable Anesthetic complications: no    Last Vitals:  Vitals:   02/18/18 0430 02/18/18 0815  BP: 134/67 136/86  Pulse: 74 72  Resp: 15 13  Temp: 37.1 C 36.6 C  SpO2: 100% 96%    Last Pain:  Vitals:   02/18/18 0815  TempSrc: Oral  PainSc:                  Nolon Nations

## 2018-02-18 NOTE — Evaluation (Addendum)
Physical Therapy Evaluation Patient Details Name: Christy May MRN: [id number] DOB: [birth year] Today's Date: 02/18/2018   History of Present Illness  67 y.o. female admitted with questionable syncopal episode. 02/17/18 C7- T1 T1-2 Laminectomy by Dr Arnoldo Morale. Pt at baseline essentially bed bound at home.  Presented 02/11/18  with questionable syncopal episode today associated with hypotension PMH: DM2 Essential HTN  anemia, gout, on chronic steroid  OSA on CPAP, gout, hx of MRSA  Clinical Impression  Patient presents s/p decompressive laminectomy with continued limitations in LE strength, decreased balance, some evidence for central vertigo, decreased activity tolerance with pain in shoulders, neck and back and decreased safety awareness.  She will continue to benefit from skilled PT in the acute setting to progress mobility and safety and allow d/c to SNF level rehab when stable.    Follow Up Recommendations SNF;Supervision/Assistance - 24 hour    Equipment Recommendations  None recommended by PT    Recommendations for Other Services       Precautions / Restrictions Precautions Precautions: Fall Precaution Comments: JP drain in place. sacral wound       Mobility  Bed Mobility Overal bed mobility: Needs Assistance Bed Mobility: Rolling;Supine to Sit Rolling: Mod assist;+2 for physical assistance;+2 for safety/equipment Sidelying to sit: Max assist;+2 for physical assistance;HOB elevated Supine to sit: Mod assist;+2 for safety/equipment;+2 for physical assistance;HOB elevated     General bed mobility comments: pt with nystagmus noted with movement. pt with beating nystagmus even supine in bed with ocular eye movements. pt requires (A) to bring BIL LE to EOB and to elevate off bed surface.   Transfers Overall transfer level: Needs assistance   Transfers: Sit to/from Stand Sit to Stand: +2 physical assistance;Max assist;From elevated surface;+2 safety/equipment          General transfer comment: pt attempted sit<>stand with pad at sacrum x5-6 times during session. pt was unable to sustain or achieve static stand. pt with BIL LE blocked. pt initiating and eager to attempt. pt with R LE sliding out and decr sensation in R LE. ; applied lift pad in sitting EOB and used maximove to lift OOB to chair  Ambulation/Gait                Stairs            Wheelchair Mobility    Modified Rankin (Stroke Patients Only)       Balance Overall balance assessment: Needs assistance Sitting-balance support: Feet supported;Single extremity supported Sitting balance-Leahy Scale: Poor Sitting balance - Comments: pt with posterior LOB at EOB     Standing balance-Leahy Scale: Zero                               Pertinent Vitals/Pain Pain Assessment: Faces Pain Score: 7  Faces Pain Scale: Hurts a little bit Pain Location: neck Pain Descriptors / Indicators: Sore;Grimacing;Operative site guarding Pain Intervention(s): Monitored during session;Premedicated before session;Repositioned    Home Living Family/patient expects to be discharged to:: Skilled nursing facility Living Arrangements: Alone Available Help at Discharge: Friend(s);Personal care attendant;Available PRN/intermittently(friends, family, church members, aide) Type of Home: House Home Access: Stairs to enter   Technical brewer of Steps: 1 Home Layout: One level [place]: Hospital bed;Walker - 4 wheels;Bedside commode;Wheelchair - [employer] - 2 wheels;Cane - single point;Tub bench Additional Comments: has friends that assist with ADL's and meals. Appears HH has been limited recently; was pivoting going from bed to  w/c but had fallen a lot    Prior Function Level of Independence: Needs assistance   Gait / Transfers Assistance Needed: Pt reports shes uses w/c for most of her mobility.      Comments: has been in and out of hospital and rehab since Nov 2018; but  recently prior to admit to [facility] was at home about 2 months after leaving [place]        Extremity/Trunk Assessment   Upper Extremity Assessment Upper Extremity Assessment: Defer to OT evaluation    Lower Extremity Assessment Lower Extremity Assessment: RLE deficits/detail;LLE deficits/detail RLE Deficits / Details: AAROM limited flexion due to body habitus, some ankle DF noted about 2/5, knee extension 2+/5, hip flexion 2-/5; reports decreased sensation compared to L RLE Sensation: decreased light touch LLE Deficits / Details: AAROM limited by body habitus, hip flexion 3-/5, knee extension 3+/5, ankle DF 3+/5       Communication   Communication: No difficulties  Cognition Arousal/Alertness: Awake/alert Behavior During Therapy: WFL for tasks assessed/performed Overall Cognitive Status: Within Functional Limits for tasks assessed                                 General Comments: w      General Comments General comments (skin integrity, edema, etc.): noted direction changing nystagmus to R and L with gaze that direction in supine    Exercises Other Exercises Other Exercises: seated hip adduction x 5, hip flexion AAROM x 5 and knee extension x2-3   Assessment/Plan    PT Assessment Patient needs continued PT services  PT Problem List Decreased strength;Decreased mobility;Decreased activity tolerance;Decreased balance;Decreased knowledge of use of DME;Impaired sensation;Decreased knowledge of precautions       PT Treatment Interventions DME instruction;Therapeutic exercise;Patient/family education;Therapeutic activities;Balance training;Functional mobility training;Wheelchair mobility training    PT Goals (Current goals can be found in the Care Plan section)  Acute Rehab PT Goals Patient Stated Goal: to walk PT Goal Formulation: With patient Time For Goal Achievement: 03/04/18 Potential to Achieve Goals: Fair    Frequency Min  3X/week   Barriers to discharge Decreased caregiver support;Inaccessible home environment      Co-evaluation PT/OT/SLP Co-Evaluation/Treatment: Yes Reason for Co-Treatment: Complexity of the patient's impairments (multi-system involvement);For patient/therapist safety;To address functional/ADL transfers PT goals addressed during session: Mobility/safety with mobility;Proper use of DME;Balance OT goals addressed during session: ADL's and self-care;Proper use of Adaptive equipment and DME;Strengthening/ROM       AM-PAC PT "6 Clicks" Daily Activity  Outcome Measure Difficulty turning over in bed (including adjusting bedclothes, sheets and blankets)?: Unable Difficulty moving from lying on back to sitting on the side of the bed? : Unable Difficulty sitting down on and standing up from a chair with arms (e.g., wheelchair, bedside commode, etc,.)?: Unable Help needed moving to and from a bed to chair (including a wheelchair)?: Total Help needed walking in hospital room?: Total Help needed climbing 3-5 steps with a railing? : Total 6 Click Score: 6    End of Session Equipment Utilized During Treatment: Gait belt;Other (comment)(maximove) Activity Tolerance: Patient tolerated treatment well Patient left: with call bell/phone within reach;in chair;with chair alarm set Nurse Communication: Mobility status;Need for lift equipment PT Visit Diagnosis: Other abnormalities of gait and mobility (R26.89);Muscle weakness (generalized) (M62.81);Dizziness and giddiness (R42)    Time: 8527-7824 PT Time Calculation (min) (ACUTE ONLY): 46 min   Charges:  PT Evaluation $PT Re-evaluation: 1 Re-eval PT Treatments $Therapeutic Activity: 8-22 mins        Magda Kiel, [place] [phone] 02/18/2018   Reginia Naas 02/18/2018, 3:42 PM

## 2018-02-18 NOTE — Social Work (Signed)
[facility] can accept pt whenever medically stable, paged MD and pt will be ready tomorrow.  Will leave hand off for weekend CSW.  Alexander Mt, [place] Work [phone]

## 2018-02-18 NOTE — Progress Notes (Signed)
.  Pt post op day 1 form laminectomy Sitting in chair   Appears comforttable   Continue telemetry Watch I/O   She is eating so should not need any more maintenance IV   Will sign off   Call with questions.

## 2018-02-18 NOTE — Progress Notes (Signed)
Subjective: The patient is alert and pleasant.  Her back is appropriately sore.  She looks well.  She already feels stronger.  Objective: Vital signs in last 24 hours: Temp:  [97.2 F (36.2 C)-98.7 F (37.1 C)] 98.7 F (37.1 C) (08/30 0430) Pulse Rate:  [74-80] 74 (08/30 0430) Resp:  [10-17] 15 (08/30 0430) BP: (131-145)/(65-75) 134/67 (08/30 0430) SpO2:  [90 %-100 %] 100 % (08/30 0430) Estimated body mass index is 53.74 kg/m as calculated from the following:   Height as of this encounter: 5\' 4"  (1.626 m).   Weight as of this encounter: 142 kg.   Intake/Output from previous day: 08/29 0701 - 08/30 0700 In: 1200 [I.V.:1200] Out: 300 [Urine:150; Blood:150] Intake/Output this shift: Total I/O In: -  Out: 150 [Urine:150]  Physical exam patient is alert and pleasant.  Her strength is 4+/5 in the left gastrocnemius and dorsiflexors 4-/5 in her right gastrocnemius and dorsiflexors.  Lab Results: Recent Labs    02/16/18 0538 02/18/18 0403  WBC 9.9 14.3*  HGB 11.5* 10.5*  HCT 36.3 34.3*  PLT 227 212   BMET Recent Labs    02/17/18 0951 02/18/18 0403  NA 139 139  K 4.2 4.7  CL 100 103  CO2 28 29  GLUCOSE 271* 157*  BUN 25* 22  CREATININE 1.08* 0.97  CALCIUM 9.9 9.6    Studies/Results: Dg Thoracic Spine 1 View  Result Date: 02/17/2018 CLINICAL DATA:  Intraoperative localization for laminectomy EXAM: THORACIC SPINE - 1 VIEW COMPARISON:  None. FLUOROSCOPY TIME:  Radiation Exposure Index (as provided by the fluoroscopic device): Not available If the device does not provide the exposure index: Fluoroscopy Time:  Not available Number of Acquired Images:  1 FINDINGS: Frontal film over the upper thoracic spine demonstrates a curved instrument overlying the T1-2 interspace. The anatomy is based on the inferior aspect of a prior cervical fusion plate extending to T7. No lateral imaging was obtained. IMPRESSION: Intraoperative localization at T1-2. These findings were called to OR  20 at the time of exam interpretation. Electronically Signed   By: Inez Catalina M.D.   On: 02/17/2018 16:50   Dg C-arm 1-60 Min  Result Date: 02/17/2018 CLINICAL DATA:  Thoracic localization. EXAM: DG C-ARM 61-120 MIN COMPARISON:  None. FINDINGS: Fluoroscopy was provided for thoracic localization. IMPRESSION: As above Electronically Signed   By: Margarette Canada M.D.   On: 02/17/2018 19:30    Assessment/Plan: Postop day #1: The patient is already stronger.  It looks like she will need rehab.  She is ready for transfer from my point of view.  I have answered all her questions.  LOS: 5 days     Ophelia Charter 02/18/2018, 6:48 AM

## 2018-02-18 NOTE — Social Work (Signed)
Acknowledging SNF recommendation, CSW followed up with Christy May- pt's preferred SNF. If pt preferred SNF is not available will present pt with other offers, aware pt medically stable for discharge.   Alexander Mt, [place] Work [phone]

## 2018-02-18 NOTE — Progress Notes (Signed)
Physical medicine rehab consult requested chart reviewed.  Patient lives alone has been bedbound for approximately 1 month noted decubitus ulcers.  Arrangements are made for skilled nursing facility of which patient is full in agreement to as per case management.  Therapies continue to also recommend skilled nursing facility.  Hold on formal rehab consult at this time with recommendations of discharge to skilled nursing facility

## 2018-02-18 NOTE — Progress Notes (Signed)
Inpatient Rehabilitation Admissions Coordinator  I have updated SW, Michiel Cowboy, that we concur with SNF rehab venue. We will sign off at this time.  Danne Baxter, RN, MSN Rehab Admissions Coordinator [phone] 02/18/2018 10:57 AM'

## 2018-02-18 NOTE — Evaluation (Signed)
Occupational Therapy RE-Evaluation Patient Details Name: Christy May MRN: [id number] DOB: 08/21/1950 Today's Date: 02/18/2018    History of Present Illness 67 y.o. female admitted with questionable syncopal episode. 02/17/18 C7- T1 T1-2 Laminectomy by Dr Arnoldo Morale. Pt at baseline essentially bed bound at home.  Presented 02/11/18  with questionable syncopal episode today associated with hypotension PMH: DM2 Essential HTN  anemia, gout, on chronic steroid  OSA on CPAP, gout, hx of MRSA   Clinical Impression   Pt currently requires hoyer lift for all transfers 2 person (A). Pt was able to sit EOB >15 minutes but demonstrates posterior LOB and requires (A) at EOB. Pt attempting sit<>stand without success with bil LE blocked. Pt motivated but with LB weakness limiting basic transfer.     Follow Up Recommendations  SNF    Equipment Recommendations  None recommended by OT    Recommendations for Other Services       Precautions / Restrictions Precautions Precautions: Fall Precaution Comments: JP drain in place. sacral wound       Mobility Bed Mobility Overal bed mobility: Needs Assistance Bed Mobility: Rolling;Supine to Sit Rolling: Mod assist;+2 for physical assistance;+2 for safety/equipment   Supine to sit: Mod assist;+2 for safety/equipment;+2 for physical assistance;HOB elevated     General bed mobility comments: pt with nystagmus noted with movement. pt with beating nystagmus even supine in bed with ocular eye movements. pt requires (A) to bring BIL LE to EOB and to elevate off bed surface.   Transfers Overall transfer level: Needs assistance   Transfers: Sit to/from Stand Sit to Stand: +2 physical assistance;Max assist;From elevated surface;+2 safety/equipment         General transfer comment: pt attempted sit<>stand with pad at sacrum x5-6 times during session. pt was unable to sustain or achieve static stand. pt with BIL LE blocked. pt initiating and eager to  attempt. pt with R LE sliding out and decr sensation in R LE.     Balance Overall balance assessment: Needs assistance   Sitting balance-Leahy Scale: Poor Sitting balance - Comments: pt with posterior LOB at EOB     Standing balance-Leahy Scale: Zero                             ADL either performed or assessed with clinical judgement   ADL Overall ADL's : Needs assistance/impaired Eating/Feeding: Independent   Grooming: Set up   Upper Body Bathing: Set up   Lower Body Bathing: Maximal assistance   Upper Body Dressing : Minimal assistance   Lower Body Dressing: Total assistance     Toilet Transfer Details (indicate cue type and reason): hoyer lift. pt currently with purewick in place           General ADL Comments: attempted transfer standpivot to chair this session and not successful. pt completed sit<>stand attempts x 5-6 times during session. pt was never fully static standing even with pad used at sacrum     Vision         Perception     Praxis      Pertinent Vitals/Pain Pain Assessment: Faces Pain Score: 7  Faces Pain Scale: Hurts a little bit Pain Location: neck Pain Descriptors / Indicators: Sore;Grimacing;Operative site guarding Pain Intervention(s): Monitored during session;Premedicated before session;Repositioned     Hand Dominance     Extremity/Trunk Assessment Upper Extremity Assessment Upper Extremity Assessment: Generalized weakness   Lower Extremity Assessment Lower Extremity Assessment: Defer to  PT evaluation       Communication Communication Communication: No difficulties   Cognition Arousal/Alertness: Awake/alert Behavior During Therapy: WFL for tasks assessed/performed Overall Cognitive Status: Within Functional Limits for tasks assessed                                 General Comments: w   General Comments  dressing dry and intact. JP drain in place    Exercises     Shoulder Instructions       Home Living   Living Arrangements: Alone Available Help at Discharge: Friend(s);Personal care attendant;Available PRN/intermittently(friends, family, church members, aide) Type of Home: House Home Access: Stairs to enter [employer] of Steps: 1   Home Layout: One level     Bathroom Shower/Tub: Teacher, early years/pre: Handicapped [place] [facility] bed;Walker - 4 wheels;Bedside commode;Wheelchair - [employer] - 2 wheels;Cane - single point;Tub bench   Additional Comments: has friends that assist with ADL's and meals. Appears HH has been limited recently; was pivoting going from bed to w/c but had fallen a lot      Prior Functioning/Environment Level of Independence: Needs assistance  Gait / Transfers Assistance Needed: Pt reports shes uses w/c for most of her mobility.      Comments: has been in and out of hospital and rehab since Nov 2018; but recently prior to admit to [facility] was at home about 2 months after leaving [employer]        OT Problem List:        OT Treatment/Interventions:      OT Goals(Current goals can be found in the care plan section) Acute Rehab OT Goals Patient Stated Goal: to walk OT Goal Formulation: With patient Time For Goal Achievement: 02/26/18 Potential to Achieve Goals: Fair ADL Goals Additional ADL Goal #1: pt will tolerate sitting EOB with min guard x 5 minutes for adls and to further assess for BPPV Additional ADL Goal #2: pt will go from supine to sit with mod A in preparation for adls Additional ADL Goal #3: pt will roll to bil sides with min A for adls  OT Frequency: Min 2X/week   Barriers to D/C:            Co-evaluation PT/OT/SLP Co-Evaluation/Treatment: Yes Reason for Co-Treatment: Complexity of the patient's impairments (multi-system involvement);Necessary to address cognition/behavior during functional activity;For patient/therapist safety;To address functional/ADL transfers   OT  goals addressed during session: ADL's and self-care;Proper use of Adaptive equipment and DME;Strengthening/ROM      AM-PAC PT "6 Clicks" Daily Activity     Outcome Measure Help from another person eating meals?: None Help from another person taking care of personal grooming?: A Little Help from another person toileting, which includes using toliet, bedpan, or urinal?: A Lot Help from another person bathing (including washing, rinsing, drying)?: A Lot Help from another person to put on and taking off regular upper body clothing?: A Lot Help from another person to put on and taking off regular lower body clothing?: Total 6 Click Score: 14   End of Session Nurse Communication: Mobility status;Precautions;Weight bearing status;Need for lift equipment  Activity Tolerance: Patient tolerated treatment well Patient left: in chair;with call bell/phone within reach;with family/visitor present  OT Visit Diagnosis: Muscle weakness (generalized) (M62.81)                Time: 9211-9417 OT Time Calculation (min):  46 min Charges:  OT General Charges $OT Visit: 1 Visit OT Evaluation $OT Re-eval: 1 Re-eval   Jeri Modena, OTR/L  Acute Rehabilitation Services Pager: [phone] Office: [phone] .   Parke Poisson B 02/18/2018, 2:19 PM

## 2018-02-18 NOTE — Progress Notes (Signed)
PROGRESS NOTE    Christy May  LEX:[id number] DOB: 10/14/1950 DOA: 02/11/2018 PCP: Lucianne Lei, MD    Brief Narrative: 67 year old with Body mass index is 53.74 kg/m.  DM TY 2 on oral meds, HTN, anemia, gout, COPD on chronic steroids airway syndrome under Dr. Lamonte Sakai for the past year, OSA on CPAP, reflux, ch kidney disease stage III, urge incontinence, mostly wheelchair-bound and has home health PT in the past  Noted in June 2019 was admitted with low back pain right leg weakness MRI at that time showed lumbar stenosis at multiple levels and she followed up with Dr. Arnoldo Morale  She also had a prior admission earlier in June with sepsis and renal failure. Admitted after syncope episode. She has been wheelchair bound for last 2 months due to LE weakness.    Assessment & Plan:   Active Problems:   Morbid obesity ([place])   ANEMIA-NOS   Essential hypertension   GERD   Sleep apnea   Gout   Weakness generalized   Obstructive sleep apnea   DM neuropathy, type II diabetes mellitus (HCC)   Dehydration   AKI (acute kidney injury) ([place])   Hypotension   Syncope, vasovagal   Syncope   Thoracic spondylosis with myelopathy   Spondylosis, thoracic, with myelopathy  1-Thoracic  spinal stenosis,  For surgery this afternoon.  EKG with ST changes and inversion, compare to EKG done on admission. For this reason will consult cardiology for clearance.  Underwent C 7-T1  And T 1-2 laminectomy on 8-29 She is feeling better. Pain is controlled.   2-Syncope;  Presented after syncope, while been evaluated by EMS.  ECHO normal EF.  Mild elevation of troponin.   Anemia; acute blood loss post surgery, expected.  Monitor hb.    Upper airway syndrome; COPD On chronic prednisone. On IV solumedrol On steroids. Nebulizer.  Stable.   DM; peripheral neuropathy;  Continue with gabapentin.  Continue with NPH; 35 units BID>   Decubitus stage 2 sacral wound, present on admission. Wound care  following.   HTN; on metoprolol.   History of gout. Continue with allopurinol.   History of dizziness; on meclizine.    DVT prophylaxis: SCD Code Status: Full code.  Family Communication: care discussed with patient.  Disposition Plan: to be determine.   Consultants:   Cardiology    Procedures:   ECHO; normal EF   Antimicrobials: none   Subjective: She is feeling well, pain is controlled.    Objective: Vitals:   02/18/18 0105 02/18/18 0141 02/18/18 0430 02/18/18 0815  BP:   134/67 136/86  Pulse: 78 77 74 72  Resp: 15 15 15 13   Temp:   98.7 F (37.1 C) 97.9 F (36.6 C)  TempSrc:   Oral Oral  SpO2: 94% 97% 100% 96%  Weight:      Height:        Intake/Output Summary (Last 24 hours) at 02/18/2018 1027 Last data filed at 02/18/2018 0900 Gross per 24 hour  Intake 1200 ml  Output 450 ml  Net 750 ml   Filed Weights   02/11/18 1455  Weight: (!) 142 kg    Examination:  General exam: NAD Respiratory system: CTA Cardiovascular system: S 1, S 2 RRR Gastrointestinal system: BS present, soft, nt Central nervous system: alert  Extremities:  No edema     Data Reviewed: I have personally reviewed following labs and imaging studies  CBC: Recent Labs  Lab 02/11/18 1541 02/12/18 0125 02/16/18 0538 02/18/18  0403  WBC 10.4 10.5 9.9 14.3*  NEUTROABS 8.5*  --   --  11.5*  HGB 12.0 11.8* 11.5* 10.5*  HCT 38.3 37.5 36.3 34.3*  MCV 89.9 90.4 89.4 91.5  PLT 279 220 227 289   Basic Metabolic Panel: Recent Labs  Lab 02/12/18 0125 02/13/18 1431 02/16/18 0538 02/17/18 0951 02/18/18 0403  NA 138 139 139 139 139  K 4.7 4.8 4.5 4.2 4.7  CL 101 102 101 100 103  CO2 26 26 28 28 29   GLUCOSE 322* 352* 240* 271* 157*  BUN 39* 23 26* 25* 22  CREATININE 1.51* 1.32* 1.10* 1.08* 0.97  CALCIUM 9.1 9.5 9.6 9.9 9.6  MG 1.8  --   --   --   --   PHOS 3.0  --   --   --   --    GFR: Estimated Creatinine Clearance: 78.5 mL/min (by C-G formula based on SCr of 0.97  mg/dL). Liver Function Tests: Recent Labs  Lab 02/11/18 1541 02/12/18 0125 02/18/18 0403  AST 13* 15 17  ALT 15 18 22   ALKPHOS 62 61 52  BILITOT 0.6 0.4 0.5  PROT 7.0 6.7 6.0*  ALBUMIN 3.0* 2.9* 2.7*   No results for input(s): LIPASE, AMYLASE in the last 168 hours. No results for input(s): AMMONIA in the last 168 hours. Coagulation Profile: No results for input(s): INR, PROTIME in the last 168 hours. Cardiac Enzymes: Recent Labs  Lab 02/11/18 2039 02/12/18 0125 02/12/18 0733 02/12/18 1454  TROPONINI <0.03 0.03* 0.05* 0.04*   BNP (last 3 results) No results for input(s): PROBNP in the last 8760 hours. HbA1C: No results for input(s): HGBA1C in the last 72 hours. CBG: Recent Labs  Lab 02/17/18 1309 02/17/18 1501 02/17/18 1826 02/17/18 2147 02/18/18 0814  GLUCAP 155* 147* 170* 116* 122*   Lipid Profile: No results for input(s): CHOL, HDL, LDLCALC, TRIG, CHOLHDL, LDLDIRECT in the last 72 hours. Thyroid Function Tests: No results for input(s): TSH, T4TOTAL, FREET4, T3FREE, THYROIDAB in the last 72 hours. Anemia Panel: No results for input(s): VITAMINB12, FOLATE, FERRITIN, TIBC, IRON, RETICCTPCT in the last 72 hours. Sepsis Labs: Recent Labs  Lab 02/11/18 1601  LATICACIDVEN 1.41    Recent Results (from the past 240 hour(s))  MRSA PCR Screening     Status: Abnormal   Collection Time: 02/11/18 10:11 PM  Result Value Ref Range Status   MRSA by PCR POSITIVE (A) NEGATIVE Final    Comment:        The GeneXpert MRSA Assay (FDA approved for NASAL specimens only), is one component of a comprehensive MRSA colonization surveillance program. It is not intended to diagnose MRSA infection nor to guide or monitor treatment for MRSA infections. RESULT CALLED TO, READ BACK BY AND VERIFIED WITH: ASARO,J RN AT 7915 02/12/18 BY TIBBITTS,K Performed at [facility], [place] [address] [place], [place] [address]   Surgical PCR screen     Status: Abnormal    Collection Time: 02/17/18  1:03 AM  Result Value Ref Range Status   MRSA, PCR POSITIVE (A) NEGATIVE Final    Comment: RESULT CALLED TO, READ BACK BY AND VERIFIED WITH: A DIAZ RN [phone] 02/17/18 A BROWNING    Staphylococcus aureus POSITIVE (A) NEGATIVE Final    Comment: (NOTE) The Xpert SA Assay (FDA approved for NASAL specimens in patients 53 years of age and older), is one component of a comprehensive surveillance program. It is not intended to diagnose infection nor to guide or monitor  treatment. Performed at [facility], [place] [address], [place], [place] [address]          Radiology Studies: Dg Thoracic Spine 1 View  Result Date: 02/17/2018 CLINICAL DATA:  Intraoperative localization for laminectomy EXAM: THORACIC SPINE - 1 VIEW COMPARISON:  None. FLUOROSCOPY TIME:  Radiation Exposure Index (as provided by the fluoroscopic device): Not available If the device does not provide the exposure index: Fluoroscopy Time:  Not available Number of Acquired Images:  1 FINDINGS: Frontal film over the upper thoracic spine demonstrates a curved instrument overlying the T1-2 interspace. The anatomy is based on the inferior aspect of a prior cervical fusion plate extending to T7. No lateral imaging was obtained. IMPRESSION: Intraoperative localization at T1-2. These findings were called to OR 20 at the time of exam interpretation. Electronically Signed   By: Inez Catalina M.D.   On: 02/17/2018 16:50   Dg C-arm 1-60 Min  Result Date: 02/17/2018 CLINICAL DATA:  Thoracic localization. EXAM: DG C-ARM 61-120 MIN COMPARISON:  None. FINDINGS: Fluoroscopy was provided for thoracic localization. IMPRESSION: As above Electronically Signed   By: Margarette Canada M.D.   On: 02/17/2018 19:30        Scheduled Meds: . acetaminophen  1,000 mg Oral Q6H  . allopurinol  300 mg Oral Daily  . cyclobenzaprine  10 mg Oral Daily  . cycloSPORINE  1 drop Both Eyes BID  . docusate sodium  100 mg Oral BID  .  fluticasone  2 spray Each Nare Daily  . folic acid  1 mg Oral Daily  . gabapentin  800 mg Oral TID  . Gerhardt's butt cream   Topical QID  . HYDROcodone-acetaminophen  2 tablet Oral TID  . insulin aspart  0-20 Units Subcutaneous TID WC  . insulin aspart  0-5 Units Subcutaneous QHS  . insulin NPH Human  35 Units Subcutaneous BID AC & HS  . ipratropium  2 spray Each Nare BID  . lidocaine  1 patch Transdermal Q24H  . loratadine  10 mg Oral Daily  . methylPREDNISolone (SOLU-MEDROL) injection  40 mg Intravenous Q12H  . metoprolol tartrate  75 mg Oral BID  . montelukast  10 mg Oral QHS  . mupirocin ointment  1 application Nasal BID  . oxybutynin  10 mg Oral Daily  . pantoprazole  40 mg Oral BID  . tetrahydrozoline  1 drop Both Eyes BID   Continuous Infusions: . lactated ringers 10 mL/hr at 02/17/18 1449  . lactated ringers 75 mL/hr at 02/18/18 0603  . vancomycin 1,250 mg (02/18/18 0606)     LOS: 5 days    Time spent: 35 minutes.     Elmarie Shiley, MD [facility] Pager [phone]  If 7PM-7AM, please contact night-coverage [email or web address] Password [facility] 02/18/2018, 10:27 AM

## 2018-02-18 NOTE — Plan of Care (Signed)
  Problem: Education: Goal: Knowledge of General Education information will improve Description: Including pain rating scale, medication(s)/side effects and non-pharmacologic comfort measures Outcome: Progressing   Problem: Health Behavior/Discharge Planning: Goal: Ability to manage health-related needs will improve Outcome: Progressing   Problem: Clinical Measurements: Goal: Ability to maintain clinical measurements within normal limits will improve Outcome: Progressing   Problem: Clinical Measurements: Goal: Diagnostic test results will improve Outcome: Progressing   Problem: Clinical Measurements: Goal: Respiratory complications will improve Outcome: Progressing   

## 2018-02-18 NOTE — Social Work (Signed)
Pt able to transfer to [facility] once bed which can accommodate pt current weight has been delivered, facility has placed this order. Will update care team when this is complete.   Alexander Mt, [place] Work [phone]

## 2018-02-19 LAB — COMPREHENSIVE METABOLIC PANEL WITH GFR
ALT: 19 U/L (ref 0–44)
AST: 12 U/L — ABNORMAL LOW (ref 15–41)
Albumin: 2.5 g/dL — ABNORMAL LOW (ref 3.5–5.0)
Alkaline Phosphatase: 58 U/L (ref 38–126)
Anion gap: 8 (ref 5–15)
BUN: 24 mg/dL — ABNORMAL HIGH (ref 8–23)
CO2: 29 mmol/L (ref 22–32)
Calcium: 9.4 mg/dL (ref 8.9–10.3)
Chloride: 102 mmol/L (ref 98–111)
Creatinine, Ser: 1.01 mg/dL — ABNORMAL HIGH (ref 0.44–1.00)
GFR calc Af Amer: >60 mL/min
GFR calc non Af Amer: 56 mL/min — ABNORMAL LOW
Glucose, Bld: 280 mg/dL — ABNORMAL HIGH (ref 70–99)
Potassium: 4.8 mmol/L (ref 3.5–5.1)
Sodium: 139 mmol/L (ref 135–145)
Total Bilirubin: 0.6 mg/dL (ref 0.3–1.2)
Total Protein: 5.8 g/dL — ABNORMAL LOW (ref 6.5–8.1)

## 2018-02-19 LAB — CBC WITH DIFFERENTIAL/PLATELET
Abs Immature Granulocytes: 0.2 10*3/uL — ABNORMAL HIGH (ref 0.0–0.1)
BASOS PCT: 0 %
Basophils Absolute: 0 10*3/uL (ref 0.0–0.1)
EOS ABS: 0 10*3/uL (ref 0.0–0.7)
Eosinophils Relative: 0 %
HCT: 33.5 % — ABNORMAL LOW (ref 36.0–46.0)
Hemoglobin: 10.3 g/dL — ABNORMAL LOW (ref 12.0–15.0)
IMMATURE GRANULOCYTES: 1 %
Lymphocytes Relative: 10 %
Lymphs Abs: 1.2 10*3/uL (ref 0.7–4.0)
MCH: 28.3 pg (ref 26.0–34.0)
MCHC: 30.7 g/dL (ref 30.0–36.0)
MCV: 92 fL (ref 78.0–100.0)
Monocytes Absolute: 0.6 10*3/uL (ref 0.1–1.0)
Monocytes Relative: 5 %
NEUTROS PCT: 84 %
Neutro Abs: 9.7 10*3/uL — ABNORMAL HIGH (ref 1.7–7.7)
PLATELETS: 184 10*3/uL (ref 150–400)
RBC: 3.64 MIL/uL — AB (ref 3.87–5.11)
RDW: 15 % (ref 11.5–15.5)
WBC: 11.6 10*3/uL — ABNORMAL HIGH (ref 4.0–10.5)

## 2018-02-19 LAB — GLUCOSE, CAPILLARY
Glucose-Capillary: 295 mg/dL — ABNORMAL HIGH (ref 70–99)
Glucose-Capillary: 262 mg/dL — ABNORMAL HIGH (ref 70–99)
Glucose-Capillary: 216 mg/dL — ABNORMAL HIGH (ref 70–99)
Glucose-Capillary: 251 mg/dL — ABNORMAL HIGH (ref 70–99)
Glucose-Capillary: 247 mg/dL — ABNORMAL HIGH (ref 70–99)
Glucose-Capillary: 269 mg/dL — ABNORMAL HIGH (ref 70–99)

## 2018-02-19 MED ORDER — PREDNISONE 20 MG PO TABS
20.0000 mg | ORAL_TABLET | Freq: Every day | ORAL | Status: DC
  Administered 2018-02-19 – 2018-02-22 (×4): 20 mg via ORAL
  Filled 2018-02-19 (×4): qty 1

## 2018-02-19 MED ORDER — GERHARDT'S BUTT CREAM: 1.0000 "application " | TOPICAL_CREAM | Freq: Four times a day (QID) | CUTANEOUS | 0 refills | Status: DC

## 2018-02-19 MED ORDER — CYCLOBENZAPRINE HCL 10 MG PO TABS: 10.0000 mg | ORAL_TABLET | Freq: Three times a day (TID) | ORAL | 0 refills | Status: DC | PRN

## 2018-02-19 MED ORDER — INSULIN NPH (HUMAN) (ISOPHANE) 100 UNIT/ML ~~LOC~~ SUSP: 40.0000 [IU] | Freq: Two times a day (BID) | SUBCUTANEOUS | 11 refills | Status: DC

## 2018-02-19 MED ORDER — LIDOCAINE 5 % EX PTCH: 1.0000 | MEDICATED_PATCH | CUTANEOUS | 0 refills | Status: DC

## 2018-02-19 MED ORDER — HYDROCODONE-ACETAMINOPHEN 5-325 MG PO TABS: 2.0000 | ORAL_TABLET | Freq: Four times a day (QID) | ORAL | 0 refills | Status: DC | PRN

## 2018-02-19 MED ORDER — BISACODYL 10 MG RE SUPP: 10.0000 mg | Freq: Every day | RECTAL | 0 refills | Status: DC | PRN

## 2018-02-19 MED ORDER — HYDROCODONE-ACETAMINOPHEN 5-325 MG PO TABS: 2.0000 | ORAL_TABLET | Freq: Three times a day (TID) | ORAL | 0 refills | Status: DC

## 2018-02-19 MED ORDER — DOCUSATE SODIUM 100 MG PO CAPS: 100.0000 mg | ORAL_CAPSULE | Freq: Two times a day (BID) | ORAL | 0 refills | Status: AC

## 2018-02-19 NOTE — Progress Notes (Addendum)
12:28pm-CSW checked with other SNFs to see if anyone has a bariatric bed, but none available and have to order it on Monday. CSW will check back with [facility] Monday.  11am-CSW notified by [facility] that they do not have a bariatric bed available and will have to order it.   Percell Locus Scott Fix LCSW [phone]

## 2018-02-19 NOTE — Discharge Summary (Addendum)
Physician Discharge Summary  Christy May PNT:[id number] DOB: Jun 20, 1951 DOA: 02/11/2018  PCP: Lucianne Lei, MD  Admit date: 02/11/2018 Discharge date: 02/19/2018  Admitted From: Home  Disposition: SNF  Recommendations for Outpatient Follow-up:  1. Follow up with PCP in 1-2 weeks 2. Please obtain BMP/CBC in one week 3. Follow up with Dr Arnoldo Morale 1 week post op   Discharge Condition: stable.  CODE STATUS: full code.  Diet recommendation: Heart Healthy   Brief/Interim Summary: : 67 year old with Body mass index is 53.74 kg/m.DM TY 2 on oral meds, HTN, anemia, gout, COPD on chronic steroids airway syndrome under Dr. Lamonte Sakai for the past year, OSA on CPAP, reflux, chkidney disease stage III, urge incontinence, mostly wheelchair-bound and has home health PT in the past  Noted in June 2019 was admitted with low back pain right leg weakness MRI at that time showed lumbar stenosis at multiple levels and she followed up with Dr. Arnoldo Morale  She also had a prior admission earlier in June with sepsis and renal failure. Admitted after syncope episode. She has been wheelchair bound for last 2 months due to LE weakness.   Discharge cancelled, facility didn't have bariatric bed.   Assessment & Plan:   Active Problems:   Morbid obesity ([place])   ANEMIA-NOS   Essential hypertension   GERD   Sleep apnea   Gout   Weakness generalized   Obstructive sleep apnea   DM neuropathy, type II diabetes mellitus (HCC)   Dehydration   AKI (acute kidney injury) ([place])   Hypotension   Syncope, vasovagal   Syncope   Thoracic spondylosis with myelopathy   Spondylosis, thoracic, with myelopathy  1-Thoracic  spinal stenosis,  For surgery this afternoon.  EKG with ST changes and inversion, compare to EKG done on admission. For this reason will consult cardiology for clearance.  Underwent C 7-T1  And T 1-2 laminectomy on 8-29 She is feeling better. Pain is controlled.  plan to transfer to rehab  today   2-Syncope;  Presented after syncope, while been evaluated by EMS.  ECHO normal EF.  Mild elevation of troponin.   Anemia; acute blood loss post surgery, expected.  Monitor hb.  stable.   Upper airway syndrome; COPD On chronic prednisone. On IV solumedrol On steroids. Nebulizer.  Stable.   DM; peripheral neuropathy;  Continue with gabapentin.  Continue with NPH; increase to 40  units BID>   Decubitus stage 2 sacral wound, present on admission. Wound care following.   HTN; on metoprolol. Resume avapro.   History of gout. Continue with allopurinol.   History of dizziness; on meclizine.    Discharge Diagnoses:  Active Problems:   Morbid obesity ([place])   ANEMIA-NOS   Essential hypertension   GERD   Sleep apnea   Gout   Weakness generalized   Obstructive sleep apnea   DM neuropathy, type II diabetes mellitus (HCC)   Dehydration   AKI (acute kidney injury) ([place])   Hypotension   Syncope, vasovagal   Syncope   Thoracic spondylosis with myelopathy   Spondylosis, thoracic, with myelopathy    Discharge Instructions  Discharge Instructions    Diet - low sodium heart healthy   Complete by:  As directed    Increase activity slowly   Complete by:  As directed      Allergies as of 02/19/2018      Reactions   Ace Inhibitors Anaphylaxis, Cough   Tolerates Irbesartan (home med)   Penicillins Hives, Other (See Comments)  Tolerated ceftriaxone in 2019 Has patient had a PCN reaction causing immediate rash, facial/tongue/throat swelling, SOB or lightheadedness with hypotension: Yes Has patient had a PCN reaction causing severe rash involving mucus membranes or skin necrosis:  No Has patient had a PCN reaction that required hospitalization: No Has patient had a PCN reaction occurring within the last 10 years: No If all of the above answers are "NO", then may proceed with Cephalosporin use.   Adhesive [tape] Hives, Rash      Medication List    STOP  taking these medications   aspirin EC 81 MG tablet   traMADol 50 MG tablet Commonly known as:  ULTRAM     TAKE these medications   allopurinol 300 MG tablet Commonly known as:  ZYLOPRIM Take 300 mg by mouth daily.   bisacodyl 10 MG suppository Commonly known as:  DULCOLAX Place 1 suppository (10 mg total) rectally daily as needed for moderate constipation.   cyclobenzaprine 10 MG tablet Commonly known as:  FLEXERIL Take 1 tablet (10 mg total) by mouth 3 (three) times daily as needed for muscle spasms. What changed:    when to take this  reasons to take this   cycloSPORINE 0.05 % ophthalmic emulsion Commonly known as:  RESTASIS Place 1 drop into both eyes 2 (two) times daily.   docusate sodium 100 MG capsule Commonly known as:  COLACE Take 1 capsule (100 mg total) by mouth 2 (two) times daily.   fluticasone 50 MCG/ACT nasal spray Commonly known as:  FLONASE SHAKE LIQUID AND USE 2 SPRAYS IN EACH NOSTRIL DAILY What changed:  See the new instructions.   folic acid 1 MG tablet Commonly known as:  FOLVITE Take 1 mg by mouth once a day   gabapentin 800 MG tablet Commonly known as:  NEURONTIN Take 800 mg by mouth 3 (three) times daily.   Gerhardt's butt cream Crea Apply 1 application topically 4 (four) times daily.   HYDROcodone-acetaminophen 5-325 MG tablet Commonly known as:  NORCO/VICODIN Take 2 tablets by mouth every 6 (six) hours as needed for moderate pain. What changed:    how much to take  reasons to take this   insulin NPH Human 100 UNIT/ML injection Commonly known as:  HUMULIN N,NOVOLIN N Inject 0.4 mLs (40 Units total) into the skin 2 (two) times daily at 8 am and 10 pm. What changed:    how much to take  when to take this   INSULIN SYRINGE 1CC/31GX5/16" 31G X 5/16" 1 ML Misc USE TO INJECT INSULIN TWICE DAILY   ipratropium 0.03 % nasal spray Commonly known as:  ATROVENT USE 2 SPRAYS IN EACH NOSTRIL THREE TIMES DAILY AS NEEDED FOR  RHINITIS What changed:  See the new instructions.   irbesartan 300 MG tablet Commonly known as:  AVAPRO Take 300 mg by mouth daily.   lidocaine 5 % Commonly known as:  LIDODERM Place 1 patch onto the skin daily. Remove & Discard patch within 12 hours or as directed by MD   loratadine 10 MG tablet Commonly known as:  CLARITIN Take 10 mg by mouth daily.   meclizine 25 MG tablet Commonly known as:  ANTIVERT Take 1 tablet (25 mg total) by mouth 3 (three) times daily as needed for dizziness.   metFORMIN 500 MG tablet Commonly known as:  GLUCOPHAGE Take 1 tablet (500 mg total) by mouth 2 (two) times daily with a meal.   metoprolol tartrate 50 MG tablet Commonly known as:  LOPRESSOR Take 75  mg by mouth 2 (two) times daily.   montelukast 10 MG tablet Commonly known as:  SINGULAIR TAKE 1 TABLET(10 MG) BY MOUTH DAILY What changed:  See the new instructions.   multivitamin with minerals tablet Take 1 tablet by mouth daily.   nystatin cream Commonly known as:  MYCOSTATIN Apply 1 application topically 2 (two) times daily as needed for dry skin (skin irritation).   ondansetron 8 MG disintegrating tablet Commonly known as:  ZOFRAN-ODT Take 1 tablet (8 mg total) by mouth every 8 (eight) hours as needed for nausea or vomiting.   ONE TOUCH LANCETS Misc Use to check sugars twice daily DX E11.22   oxybutynin 10 MG 24 hr tablet Commonly known as:  DITROPAN-XL Take 1 tablet (10 mg total) by mouth daily.   pantoprazole 40 MG tablet Commonly known as:  PROTONIX TAKE 1 TABLET(40 MG) BY MOUTH TWICE DAILY What changed:  See the new instructions.   predniSONE 20 MG tablet Commonly known as:  DELTASONE TAKE 1 TABLET(20 MG) BY MOUTH DAILY WITH BREAKFAST What changed:  Another medication with the same name was removed. Continue taking this medication, and follow the directions you see here.   ranitidine 300 MG tablet Commonly known as:  ZANTAC Take 1 tablet (300 mg total) by mouth 2  (two) times daily.   sodium chloride 0.65 % Soln nasal spray Commonly known as:  OCEAN Place 2 sprays into both nostrils 2 (two) times daily.   TURMERIC PO Take 1 tablet by mouth daily.   VISINE OP Apply to eye.   Vitamin D-3 1000 units Caps Take 2,000 Units by mouth daily.      Contact information for after-discharge care    Destination    [facility] .   Service:  Skilled Nursing Contact information: [address] [place] [phone]             Allergies  Allergen Reactions  . Ace Inhibitors Anaphylaxis and Cough    Tolerates Irbesartan (home med)  . Penicillins Hives and Other (See Comments)    Tolerated ceftriaxone in 2019  Has patient had a PCN reaction causing immediate rash, facial/tongue/throat swelling, SOB or lightheadedness with hypotension: Yes Has patient had a PCN reaction causing severe rash involving mucus membranes or skin necrosis:  No Has patient had a PCN reaction that required hospitalization: No Has patient had a PCN reaction occurring within the last 10 years: No If all of the above answers are "NO", then may proceed with Cephalosporin use.  . Adhesive [Tape] Hives and Rash    Consultations: Dr Arnoldo Morale  Procedures/Studies: Ct Head Wo Contrast  Result Date: 02/06/2018 CLINICAL DATA:  Dizziness. EXAM: CT HEAD WITHOUT CONTRAST TECHNIQUE: Contiguous axial images were obtained from the base of the skull through the vertex without intravenous contrast. COMPARISON:  11/17/2017 FINDINGS: Brain: No evidence of acute infarction, hemorrhage, hydrocephalus, extra-axial collection or mass lesion/mass effect. Mild cerebral atrophy. Vascular: Moderate intracranial arterial vascular calcifications are present. Skull: Calvarium appears intact. Sinuses/Orbits: Paranasal sinuses and mastoid air cells are clear. Other: None. IMPRESSION: No acute intracranial abnormality. Electronically Signed   By: Lucienne Capers M.D.   On: 02/06/2018 01:34   Mr Jodene Nam Head Wo Contrast  Result Date: 02/06/2018 CLINICAL DATA:  Vertigo EXAM: MR HEAD WITHOUT CONTRAST MR CIRCLE OF WILLIS WITHOUT CONTRAST MRA OF THE NECK WITHOUT AND WITH CONTRAST TECHNIQUE: Multiplanar, multiecho pulse sequences of the brain, circle of willis and surrounding structures were obtained  without intravenous contrast. Angiographic images of the neck were obtained using MRA technique without and with intravenous contrast. CONTRAST:  8mL MULTIHANCE GADOBENATE DIMEGLUMINE 529 MG/ML IV SOLN COMPARISON:  CT head 02/06/2018. FINDINGS: MR HEAD FINDINGS Brain: Ventricle size normal. Negative for acute infarct. Mild chronic microvascular ischemic change in the white matter. Negative for hemorrhage mass or fluid collection. Vascular: Normal arterial flow voids Skull and upper cervical spine: Negative Sinuses/Orbits: Bilateral cataract surgery. Paranasal sinuses clear. Other: None MR CIRCLE OF WILLIS FINDINGS Left vertebral artery dominant and widely patent to the basilar basilar. Left PICA patent. Right vertebral artery is hypoplastic and ends in PICA. This is most likely a congenital variant. Basilar widely patent. Superior cerebellar and posterior cerebral arteries patent bilaterally. Patent posterior communicating arteries bilaterally. Internal carotid artery patent bilaterally without significant stenosis. Anterior and middle cerebral arteries patent bilaterally without significant stenosis. Negative for cerebral aneurysm. MRA NECK FINDINGS Antegrade flow in the carotid and vertebral arteries bilaterally. Suboptimal image quality due to timing of the injection. Carotid artery is patent bilaterally. No stenosis identified although anatomic detail is suboptimal. Left vertebral dominant and widely patent. Small right vertebral artery ends in PICA, likely congenital. IMPRESSION: 1. No acute intracranial abnormality. Mild chronic microvascular ischemic change in the  white matter 2. No significant intracranial stenosis or large vessel occlusion. 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 Suboptimal MRA neck without significant stenosis. Electronically Signed   By: Franchot Gallo M.D.   On: 02/06/2018 11:33   Mr Angiogram Neck W Or Wo Contrast  Result Date: 02/06/2018 CLINICAL DATA:  Vertigo EXAM: MR HEAD WITHOUT CONTRAST MR CIRCLE OF WILLIS WITHOUT CONTRAST MRA OF THE NECK WITHOUT AND WITH CONTRAST TECHNIQUE: Multiplanar, multiecho pulse sequences of the brain, circle of willis and surrounding structures were obtained without intravenous contrast. Angiographic images of the neck were obtained using MRA technique without and with intravenous contrast. CONTRAST:  43mL MULTIHANCE GADOBENATE DIMEGLUMINE 529 MG/ML IV SOLN COMPARISON:  CT head 02/06/2018. FINDINGS: MR HEAD FINDINGS Brain: Ventricle size normal. Negative for acute infarct. Mild chronic microvascular ischemic change in the white matter. Negative for hemorrhage mass or fluid collection. Vascular: Normal arterial flow voids Skull and upper cervical spine: Negative Sinuses/Orbits: Bilateral cataract surgery. Paranasal sinuses clear. Other: None MR CIRCLE OF WILLIS FINDINGS Left vertebral artery dominant and widely patent to the basilar basilar. Left PICA patent. Right vertebral artery is hypoplastic and ends in PICA. This is most likely a congenital variant. Basilar widely patent. Superior cerebellar and posterior cerebral arteries patent bilaterally. Patent posterior communicating arteries bilaterally. Internal carotid artery patent bilaterally without significant stenosis. Anterior and middle cerebral arteries patent bilaterally without significant stenosis. Negative for cerebral aneurysm. MRA NECK FINDINGS Antegrade flow in the carotid and vertebral arteries bilaterally. Suboptimal image quality due to timing of the injection. Carotid artery is patent bilaterally. No stenosis identified although  anatomic detail is suboptimal. Left vertebral dominant and widely patent. Small right vertebral artery ends in PICA, likely congenital. IMPRESSION: 1. No acute intracranial abnormality. Mild chronic microvascular ischemic change in the white matter 2. No significant intracranial stenosis or large vessel occlusion. 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 Suboptimal MRA neck without significant stenosis. Electronically Signed   By: Franchot Gallo M.D.   On: 02/06/2018 11:33   Mr Brain Wo Contrast  Result Date: 02/06/2018 CLINICAL DATA:  Vertigo EXAM: MR HEAD WITHOUT CONTRAST MR CIRCLE OF WILLIS WITHOUT CONTRAST MRA OF THE NECK WITHOUT AND WITH CONTRAST TECHNIQUE: Multiplanar, multiecho  pulse sequences of the brain, circle of willis and surrounding structures were obtained without intravenous contrast. Angiographic images of the neck were obtained using MRA technique without and with intravenous contrast. CONTRAST:  47mL MULTIHANCE GADOBENATE DIMEGLUMINE 529 MG/ML IV SOLN COMPARISON:  CT head 02/06/2018. FINDINGS: MR HEAD FINDINGS Brain: Ventricle size normal. Negative for acute infarct. Mild chronic microvascular ischemic change in the white matter. Negative for hemorrhage mass or fluid collection. Vascular: Normal arterial flow voids Skull and upper cervical spine: Negative Sinuses/Orbits: Bilateral cataract surgery. Paranasal sinuses clear. Other: None MR CIRCLE OF WILLIS FINDINGS Left vertebral artery dominant and widely patent to the basilar basilar. Left PICA patent. Right vertebral artery is hypoplastic and ends in PICA. This is most likely a congenital variant. Basilar widely patent. Superior cerebellar and posterior cerebral arteries patent bilaterally. Patent posterior communicating arteries bilaterally. Internal carotid artery patent bilaterally without significant stenosis. Anterior and middle cerebral arteries patent bilaterally without significant stenosis. Negative for cerebral  aneurysm. MRA NECK FINDINGS Antegrade flow in the carotid and vertebral arteries bilaterally. Suboptimal image quality due to timing of the injection. Carotid artery is patent bilaterally. No stenosis identified although anatomic detail is suboptimal. Left vertebral dominant and widely patent. Small right vertebral artery ends in PICA, likely congenital. IMPRESSION: 1. No acute intracranial abnormality. Mild chronic microvascular ischemic change in the white matter 2. No significant intracranial stenosis or large vessel occlusion. 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 Suboptimal MRA neck without significant stenosis. Electronically Signed   By: Franchot Gallo M.D.   On: 02/06/2018 11:33   Mr Cervical Spine Wo Contrast  Result Date: 02/14/2018 CLINICAL DATA:  Weakness and multiple falls EXAM: MRI CERVICAL AND THORACIC SPINE WITHOUT CONTRAST TECHNIQUE: Multiplanar and multiecho pulse sequences of the cervical and thoracic spine were obtained without intravenous contrast. COMPARISON:  None. FINDINGS: MRI CERVICAL SPINE FINDINGS Alignment: Physiologic. Vertebrae: There is ACDF hardware extending from C3-C7. Cord: Normal signal and morphology. Posterior Fossa, vertebral arteries, paraspinal tissues: Negative. Disc levels: C2-C3: Small central disc protrusion and mild bilateral facet hypertrophy. No spinal canal stenosis. Mild right foraminal stenosis. C3-C4: Postfusion changes without stenosis. C4-C5: Postfusion changes without stenosis. C5-C6: Postfusion changes without stenosis. C6-C7: Postfusion changes without stenosis. C7-T1: No spinal canal stenosis. Mild narrowing of the right neural foramen. MRI THORACIC SPINE FINDINGS The due to patient body habitus, axial images were not able to be obtained below the C7-T1 level. Alignment: Normal Vertebrae: No fracture, evidence of discitis, or bone lesion. Cord: There is focal hyperintense T2-weighted signal within the spinal cord at the T1-T2  level. The remainder of the spinal cord is normal. Paraspinal and other soft tissues: Negative. Disc levels: Assessment of disc spaces is limited due to the lack of axial imaging. There is severe spinal canal stenosis at the T1-T2 level, predominantly due to posterior osseous spurring. There is no other high-grade spinal canal stenosis. There are small disc herniations at T9-10 and T10-11. IMPRESSION: 1. Examination limited by patient body habitus. Axial images of the thoracic spine were not able to be obtained. 2. Severe spinal canal stenosis at T1-T2, predominantly caused by posterior osseous spurring. Signal changes within the spinal cord at this level likely indicate myelomalacia. 3. No high-grade cervical stenosis. Electronically Signed   By: Ulyses Jarred M.D.   On: 02/14/2018 19:47   Mr Thoracic Spine Wo Contrast  Result Date: 02/15/2018 CLINICAL DATA:  Follow-up upper thoracic spinal stenosis. History of ACDF. EXAM: MRI THORACIC SPINE WITHOUT CONTRAST TECHNIQUE: Axial, multisequence  MR imaging of the thoracic spine was performed. No intravenous contrast was administered. COMPARISON:  MRI cervical and thoracic spine February 14, 2018 FINDINGS: Axial sequences through upper thoracic spine. Severe canal stenosis T1-2, AP dimension of the canal is 5 mm, low signal RIGHT posterolateral epidural space most compatible with calcified ligamentum flavum. Cervical spinal cord deformity without definite edema. No syrinx. Mild canal stenosis T3-4. Nondiagnostic assessment of neural foraminal narrowing on axial sequences. IMPRESSION: 1. Limited axial series through upper thoracic spine. 2. Severe canal stenosis at T1-2 with cord compression without definite cord edema. Electronically Signed   By: Elon Alas M.D.   On: 02/15/2018 18:54   Mr Thoracic Spine Wo Contrast  Result Date: 02/14/2018 CLINICAL DATA:  Weakness and multiple falls EXAM: MRI CERVICAL AND THORACIC SPINE WITHOUT CONTRAST TECHNIQUE:  Multiplanar and multiecho pulse sequences of the cervical and thoracic spine were obtained without intravenous contrast. COMPARISON:  None. FINDINGS: MRI CERVICAL SPINE FINDINGS Alignment: Physiologic. Vertebrae: There is ACDF hardware extending from C3-C7. Cord: Normal signal and morphology. Posterior Fossa, vertebral arteries, paraspinal tissues: Negative. Disc levels: C2-C3: Small central disc protrusion and mild bilateral facet hypertrophy. No spinal canal stenosis. Mild right foraminal stenosis. C3-C4: Postfusion changes without stenosis. C4-C5: Postfusion changes without stenosis. C5-C6: Postfusion changes without stenosis. C6-C7: Postfusion changes without stenosis. C7-T1: No spinal canal stenosis. Mild narrowing of the right neural foramen. MRI THORACIC SPINE FINDINGS The due to patient body habitus, axial images were not able to be obtained below the C7-T1 level. Alignment: Normal Vertebrae: No fracture, evidence of discitis, or bone lesion. Cord: There is focal hyperintense T2-weighted signal within the spinal cord at the T1-T2 level. The remainder of the spinal cord is normal. Paraspinal and other soft tissues: Negative. Disc levels: Assessment of disc spaces is limited due to the lack of axial imaging. There is severe spinal canal stenosis at the T1-T2 level, predominantly due to posterior osseous spurring. There is no other high-grade spinal canal stenosis. There are small disc herniations at T9-10 and T10-11. IMPRESSION: 1. Examination limited by patient body habitus. Axial images of the thoracic spine were not able to be obtained. 2. Severe spinal canal stenosis at T1-T2, predominantly caused by posterior osseous spurring. Signal changes within the spinal cord at this level likely indicate myelomalacia. 3. No high-grade cervical stenosis. Electronically Signed   By: Ulyses Jarred M.D.   On: 02/14/2018 19:47   Dg Thoracic Spine 1 View  Result Date: 02/17/2018 CLINICAL DATA:  Intraoperative  localization for laminectomy EXAM: THORACIC SPINE - 1 VIEW COMPARISON:  None. FLUOROSCOPY TIME:  Radiation Exposure Index (as provided by the fluoroscopic device): Not available If the device does not provide the exposure index: Fluoroscopy Time:  Not available Number of Acquired Images:  1 FINDINGS: Frontal film over the upper thoracic spine demonstrates a curved instrument overlying the T1-2 interspace. The anatomy is based on the inferior aspect of a prior cervical fusion plate extending to T7. No lateral imaging was obtained. IMPRESSION: Intraoperative localization at T1-2. These findings were called to OR 20 at the time of exam interpretation. Electronically Signed   By: Inez Catalina M.D.   On: 02/17/2018 16:50   Dg Chest Portable 1 View  Result Date: 02/11/2018 CLINICAL DATA:  Hypertension. EXAM: PORTABLE CHEST 1 VIEW COMPARISON:  Radiograph December 14, 2017. FINDINGS: Stable cardiomediastinal silhouette. No pneumothorax or pleural effusion is noted. Both lungs are clear. The visualized skeletal structures are unremarkable. IMPRESSION: No acute cardiopulmonary abnormality seen. Electronically Signed  By: Marijo Conception, M.D.   On: 02/11/2018 16:35   Dg C-arm 1-60 Min  Result Date: 02/17/2018 CLINICAL DATA:  Thoracic localization. EXAM: DG C-ARM 61-120 MIN COMPARISON:  None. FINDINGS: Fluoroscopy was provided for thoracic localization. IMPRESSION: As above Electronically Signed   By: Margarette Canada M.D.   On: 02/17/2018 19:30    Subjective: Feeling well, had pain last night.  Denies dyspnea.   Discharge Exam: Vitals:   02/19/18 0652 02/19/18 0837  BP: (!) 140/97   Pulse: (!) 142 68  Resp: (!) 22   Temp:    SpO2: 100%    Vitals:   02/19/18 0520 02/19/18 0651 02/19/18 0652 02/19/18 0837  BP: (!) 179/85  (!) 140/97   Pulse: 71 73 (!) 142 68  Resp: 13 15 (!) 22   Temp:      TempSrc:      SpO2: 96% 100% 100%   Weight:      Height:        General: Pt is alert, awake, not in acute  distress Cardiovascular: RRR, S1/S2 +, no rubs, no gallops Respiratory: CTA bilaterally, no wheezing, no rhonchi Abdominal: Soft, NT, ND, bowel sounds + Extremities: no edema, no cyanosis    The results of significant diagnostics from this hospitalization (including imaging, microbiology, ancillary and laboratory) are listed below for reference.     Microbiology: Recent Results (from the past 240 hour(s))  MRSA PCR Screening     Status: Abnormal   Collection Time: 02/11/18 10:11 PM  Result Value Ref Range Status   MRSA by PCR POSITIVE (A) NEGATIVE Final    Comment:        The GeneXpert MRSA Assay (FDA approved for NASAL specimens only), is one component of a comprehensive MRSA colonization surveillance program. It is not intended to diagnose MRSA infection nor to guide or monitor treatment for MRSA infections. RESULT CALLED TO, READ BACK BY AND VERIFIED WITH: ASARO,J RN AT 8299 02/12/18 BY TIBBITTS,K Performed at [facility] [facility], [place] [address], [place], [place] [address]   Surgical PCR screen     Status: Abnormal   Collection Time: 02/17/18  1:03 AM  Result Value Ref Range Status   MRSA, PCR POSITIVE (A) NEGATIVE Final    Comment: RESULT CALLED TO, READ BACK BY AND VERIFIED WITH: A DIAZ RN [phone] 02/17/18 A BROWNING    Staphylococcus aureus POSITIVE (A) NEGATIVE Final    Comment: (NOTE) The Xpert SA Assay (FDA approved for NASAL specimens in patients 46 years of age and older), is one component of a comprehensive surveillance program. It is not intended to diagnose infection nor to guide or monitor treatment. Performed at [facility], [place] [address], [place], [place] [address]      Labs: BNP (last 3 results) No results for input(s): BNP in the last 8760 hours. Basic Metabolic Panel: Recent Labs  Lab 02/13/18 1431 02/16/18 0538 02/17/18 0951 02/18/18 0403 02/19/18 0350  NA 139 139 139 139 139  K 4.8 4.5 4.2 4.7 4.8  CL 102 101 100 103  102  CO2 26 28 28 29 29   GLUCOSE 352* 240* 271* 157* 280*  BUN 23 26* 25* 22 24*  CREATININE 1.32* 1.10* 1.08* 0.97 1.01*  CALCIUM 9.5 9.6 9.9 9.6 9.4   Liver Function Tests: Recent Labs  Lab 02/18/18 0403 02/19/18 0350  AST 17 12*  ALT 22 19  ALKPHOS 52 58  BILITOT 0.5 0.6  PROT 6.0* 5.8*  ALBUMIN  2.7* 2.5*   No results for input(s): LIPASE, AMYLASE in the last 168 hours. No results for input(s): AMMONIA in the last 168 hours. CBC: Recent Labs  Lab 02/16/18 0538 02/18/18 0403 02/19/18 0350  WBC 9.9 14.3* 11.6*  NEUTROABS  --  11.5* 9.7*  HGB 11.5* 10.5* 10.3*  HCT 36.3 34.3* 33.5*  MCV 89.4 91.5 92.0  PLT 227 212 184   Cardiac Enzymes: Recent Labs  Lab 02/12/18 1454  TROPONINI 0.04*   BNP: Invalid input(s): POCBNP CBG: Recent Labs  Lab 02/18/18 1252 02/18/18 1719 02/18/18 2115 02/19/18 0625 02/19/18 0837  GLUCAP 182* 329* 277* 295* 262*   D-Dimer No results for input(s): DDIMER in the last 72 hours. Hgb A1c No results for input(s): HGBA1C in the last 72 hours. Lipid Profile No results for input(s): CHOL, HDL, LDLCALC, TRIG, CHOLHDL, LDLDIRECT in the last 72 hours. Thyroid function studies No results for input(s): TSH, T4TOTAL, T3FREE, THYROIDAB in the last 72 hours.  Invalid input(s): FREET3 Anemia work up No results for input(s): VITAMINB12, FOLATE, FERRITIN, TIBC, IRON, RETICCTPCT in the last 72 hours. Urinalysis    Component Value Date/Time   COLORURINE YELLOW 02/11/2018 1541   APPEARANCEUR HAZY (A) 02/11/2018 1541   LABSPEC 1.013 02/11/2018 1541   PHURINE 5.0 02/11/2018 1541   GLUCOSEU 150 (A) 02/11/2018 1541   GLUCOSEU NEGATIVE 12/14/2011 1707   HGBUR NEGATIVE 02/11/2018 1541   BILIRUBINUR NEGATIVE 02/11/2018 1541   BILIRUBINUR n 11/16/2014 1515   KETONESUR 5 (A) 02/11/2018 1541   PROTEINUR NEGATIVE 02/11/2018 1541   UROBILINOGEN 0.2 02/27/2015 2226   NITRITE NEGATIVE 02/11/2018 1541   LEUKOCYTESUR TRACE (A) 02/11/2018 1541    Sepsis Labs Invalid input(s): PROCALCITONIN,  WBC,  LACTICIDVEN Microbiology Recent Results (from the past 240 hour(s))  MRSA PCR Screening     Status: Abnormal   Collection Time: 02/11/18 10:11 PM  Result Value Ref Range Status   MRSA by PCR POSITIVE (A) NEGATIVE Final    Comment:        The GeneXpert MRSA Assay (FDA approved for NASAL specimens only), is one component of a comprehensive MRSA colonization surveillance program. It is not intended to diagnose MRSA infection nor to guide or monitor treatment for MRSA infections. RESULT CALLED TO, READ BACK BY AND VERIFIED WITH: ASARO,J RN AT 9357 02/12/18 BY TIBBITTS,K Performed at [facility], [place] [address], [place], [place] [address]   Surgical PCR screen     Status: Abnormal   Collection Time: 02/17/18  1:03 AM  Result Value Ref Range Status   MRSA, PCR POSITIVE (A) NEGATIVE Final    Comment: RESULT CALLED TO, READ BACK BY AND VERIFIED WITH: A DIAZ RN [phone] 02/17/18 A BROWNING    Staphylococcus aureus POSITIVE (A) NEGATIVE Final    Comment: (NOTE) The Xpert SA Assay (FDA approved for NASAL specimens in patients 76 years of age and older), is one component of a comprehensive surveillance program. It is not intended to diagnose infection nor to guide or monitor treatment. Performed at [facility], [place] [address], [place], [place] [address]      Time coordinating discharge: 35 minutes.   SIGNED:   Elmarie Shiley, MD  [facility] 02/19/2018, 10:35 AM Pager   If 7PM-7AM, please contact night-coverage [email or web address] Password [email or web address]

## 2018-02-20 LAB — GLUCOSE, CAPILLARY
GLUCOSE-CAPILLARY: 175 mg/dL — AB (ref 70–99)
GLUCOSE-CAPILLARY: 276 mg/dL — AB (ref 70–99)
Glucose-Capillary: 183 mg/dL — ABNORMAL HIGH (ref 70–99)
Glucose-Capillary: 324 mg/dL — ABNORMAL HIGH (ref 70–99)

## 2018-02-20 MED ORDER — POLYETHYLENE GLYCOL 3350 17 G PO PACK
17.0000 g | PACK | Freq: Two times a day (BID) | ORAL | Status: DC
  Administered 2018-02-20 – 2018-02-22 (×5): 17 g via ORAL
  Filled 2018-02-20 (×5): qty 1

## 2018-02-20 NOTE — Progress Notes (Signed)
PROGRESS NOTE    Christy May  DDU:[id number] DOB: [birth year] DOA: 02/11/2018 PCP: Lucianne Lei, MD    Brief Narrative: 67 year old with Body mass index is 53.74 kg/m.  DM TY 2 on oral meds, HTN, anemia, gout, COPD on chronic steroids airway syndrome under Dr. Lamonte Sakai for the past year, OSA on CPAP, reflux, ch kidney disease stage III, urge incontinence, mostly wheelchair-bound and has home health PT in the past  Noted in June 2019 was admitted with low back pain right leg weakness MRI at that time showed lumbar stenosis at multiple levels and she followed up with Dr. Arnoldo Morale  She also had a prior admission earlier in June with sepsis and renal failure. Admitted after syncope episode. She has been wheelchair bound for last 2 months due to LE weakness.    Assessment & Plan:   Active Problems:   Morbid obesity ([place])   ANEMIA-NOS   Essential hypertension   GERD   Sleep apnea   Gout   Weakness generalized   Obstructive sleep apnea   DM neuropathy, type II diabetes mellitus (HCC)   Dehydration   AKI (acute kidney injury) ([place])   Hypotension   Syncope, vasovagal   Syncope   Thoracic spondylosis with myelopathy   Spondylosis, thoracic, with myelopathy  1-Thoracic  spinal stenosis,  For surgery this afternoon.  EKG with ST changes and inversion, compare to EKG done on admission. For this reason will consult cardiology for clearance.  Underwent C 7-T1  And T 1-2 laminectomy on 8-29 Complaining of back pain, just received pain meds.  Discharge cancelled yesterday, SNF didn't have Bariatric bed.   2-Syncope;  Presented after syncope, while been evaluated by EMS.  ECHO normal EF.  Mild elevation of troponin.  No further episodes.   Anemia; acute blood loss post surgery, expected.  Monitor hb.    Upper airway syndrome; COPD On chronic prednisone. On IV solumedrol On steroids. Nebulizer.  Stable.   DM; peripheral neuropathy;  Continue with gabapentin.  Continue with  NPH; 35 units BID>   Decubitus stage 2 sacral wound, present on admission. Wound care following.   HTN; on metoprolol.   History of gout. Continue with allopurinol.   History of dizziness; on meclizine.    DVT prophylaxis: SCD Code Status: Full code.  Family Communication: care discussed with patient.  Disposition Plan: to be determine.   Consultants:   Cardiology    Procedures:   ECHO; normal EF   Antimicrobials: none   Subjective: Complaining of pain today.     Objective: Vitals:   02/19/18 2121 02/20/18 0058 02/20/18 0114 02/20/18 0440  BP:   (!) 141/97 (!) 161/66  Pulse: 72 72 72 70  Resp: 12 17 18 20   Temp:   (!) 97.5 F (36.4 C) 97.6 F (36.4 C)  TempSrc:   Oral Oral  SpO2: 92% 95% 100% 100%  Weight:      Height:        Intake/Output Summary (Last 24 hours) at 02/20/2018 0930 Last data filed at 02/20/2018 0358 Gross per 24 hour  Intake 570 ml  Output 490 ml  Net 80 ml   Filed Weights   02/11/18 1455  Weight: (!) 142 kg    Examination:  General exam: NAD Respiratory system: CTA Cardiovascular system: S 1, S 2 RRR Gastrointestinal system: BS present, soft,  Central nervous system: Alert,  Extremities:  No edema     Data Reviewed: I have personally reviewed following labs and  imaging studies  CBC: Recent Labs  Lab 02/16/18 0538 02/18/18 0403 02/19/18 0350  WBC 9.9 14.3* 11.6*  NEUTROABS  --  11.5* 9.7*  HGB 11.5* 10.5* 10.3*  HCT 36.3 34.3* 33.5*  MCV 89.4 91.5 92.0  PLT 227 212 846   Basic Metabolic Panel: Recent Labs  Lab 02/13/18 1431 02/16/18 0538 02/17/18 0951 02/18/18 0403 02/19/18 0350  NA 139 139 139 139 139  K 4.8 4.5 4.2 4.7 4.8  CL 102 101 100 103 102  CO2 26 28 28 29 29   GLUCOSE 352* 240* 271* 157* 280*  BUN 23 26* 25* 22 24*  CREATININE 1.32* 1.10* 1.08* 0.97 1.01*  CALCIUM 9.5 9.6 9.9 9.6 9.4   GFR: Estimated Creatinine Clearance: 75.4 mL/min (A) (by C-G formula based on SCr of 1.01 mg/dL  (H)). Liver Function Tests: Recent Labs  Lab 02/18/18 0403 02/19/18 0350  AST 17 12*  ALT 22 19  ALKPHOS 52 58  BILITOT 0.5 0.6  PROT 6.0* 5.8*  ALBUMIN 2.7* 2.5*   No results for input(s): LIPASE, AMYLASE in the last 168 hours. No results for input(s): AMMONIA in the last 168 hours. Coagulation Profile: No results for input(s): INR, PROTIME in the last 168 hours. Cardiac Enzymes: No results for input(s): CKTOTAL, CKMB, CKMBINDEX, TROPONINI in the last 168 hours. BNP (last 3 results) No results for input(s): PROBNP in the last 8760 hours. HbA1C: No results for input(s): HGBA1C in the last 72 hours. CBG: Recent Labs  Lab 02/19/18 1213 02/19/18 1743 02/19/18 2121 02/19/18 2123 02/20/18 0802  GLUCAP 216* 251* 247* 269* 183*   Lipid Profile: No results for input(s): CHOL, HDL, LDLCALC, TRIG, CHOLHDL, LDLDIRECT in the last 72 hours. Thyroid Function Tests: No results for input(s): TSH, T4TOTAL, FREET4, T3FREE, THYROIDAB in the last 72 hours. Anemia Panel: No results for input(s): VITAMINB12, FOLATE, FERRITIN, TIBC, IRON, RETICCTPCT in the last 72 hours. Sepsis Labs: No results for input(s): PROCALCITON, LATICACIDVEN in the last 168 hours.  Recent Results (from the past 240 hour(s))  MRSA PCR Screening     Status: Abnormal   Collection Time: 02/11/18 10:11 PM  Result Value Ref Range Status   MRSA by PCR POSITIVE (A) NEGATIVE Final    Comment:        The GeneXpert MRSA Assay (FDA approved for NASAL specimens only), is one component of a comprehensive MRSA colonization surveillance program. It is not intended to diagnose MRSA infection nor to guide or monitor treatment for MRSA infections. RESULT CALLED TO, READ BACK BY AND VERIFIED WITH: ASARO,J RN AT 9629 02/12/18 BY TIBBITTS,K Performed at [facility], [place] [address], [place], [place] [address]   Surgical PCR screen     Status: Abnormal   Collection Time: 02/17/18  1:03 AM  Result Value  Ref Range Status   MRSA, PCR POSITIVE (A) NEGATIVE Final    Comment: RESULT CALLED TO, READ BACK BY AND VERIFIED WITH: A DIAZ RN [phone] 02/17/18 A BROWNING    Staphylococcus aureus POSITIVE (A) NEGATIVE Final    Comment: (NOTE) The Xpert SA Assay (FDA approved for NASAL specimens in patients 95 years of age and older), is one component of a comprehensive surveillance program. It is not intended to diagnose infection nor to guide or monitor treatment. Performed at [facility], [place] [address] [place], [place] [address]          Radiology Studies: No results found.      Scheduled Meds: . allopurinol  300 mg Oral Daily  . cyclobenzaprine  10 mg Oral Daily  . cycloSPORINE  1 drop Both Eyes BID  . docusate sodium  100 mg Oral BID  . fluticasone  2 spray Each Nare Daily  . folic acid  1 mg Oral Daily  . gabapentin  800 mg Oral TID  . Gerhardt's butt cream   Topical QID  . HYDROcodone-acetaminophen  2 tablet Oral TID  . insulin aspart  0-20 Units Subcutaneous TID WC  . insulin aspart  0-5 Units Subcutaneous QHS  . insulin NPH Human  35 Units Subcutaneous BID AC & HS  . ipratropium  2 spray Each Nare BID  . lidocaine  1 patch Transdermal Q24H  . loratadine  10 mg Oral Daily  . metoprolol tartrate  75 mg Oral BID  . montelukast  10 mg Oral QHS  . oxybutynin  10 mg Oral Daily  . pantoprazole  40 mg Oral BID  . predniSONE  20 mg Oral Q breakfast  . tetrahydrozoline  1 drop Both Eyes BID   Continuous Infusions: . lactated ringers 10 mL/hr at 02/17/18 1449  . vancomycin 1,250 mg (02/20/18 0343)     LOS: 7 days    Time spent: 35 minutes.     Elmarie Shiley, MD [facility] Pager [phone]  If 7PM-7AM, please contact night-coverage [email or web address] Password [email or web address] 02/20/2018, 9:30 AM

## 2018-02-20 NOTE — Progress Notes (Signed)
Neurosurgery Progress Note  No issues overnight. Complains of back soreness Tolerating po  EXAM:  BP (!) 180/75 (BP Location: Right Arm)   Pulse 74   Temp 97.8 F (36.6 C)   Resp 20   Ht 5\' 4"  (1.626 m)   Wt (!) 142 kg   SpO2 99%   BMI 53.74 kg/m   Awake, alert, oriented  Speech fluent, appropriate  BLE weakness in gastroc and DF stable No new deficits  PLAN Stable motor exam No new NSY recs Awaiting placement

## 2018-02-21 ENCOUNTER — Encounter: Payer: Self-pay | Admitting: *Deleted

## 2018-02-21 LAB — BASIC METABOLIC PANEL
Anion gap: 7 (ref 5–15)
BUN: 24 mg/dL — AB (ref 8–23)
CALCIUM: 9.3 mg/dL (ref 8.9–10.3)
CO2: 32 mmol/L (ref 22–32)
CREATININE: 1.02 mg/dL — AB (ref 0.44–1.00)
Chloride: 101 mmol/L (ref 98–111)
GFR calc non Af Amer: 55 mL/min — ABNORMAL LOW (ref >60)
GLUCOSE: 178 mg/dL — AB (ref 70–99)
Potassium: 4.1 mmol/L (ref 3.5–5.1)
Sodium: 140 mmol/L (ref 135–145)

## 2018-02-21 LAB — GLUCOSE, CAPILLARY
Glucose-Capillary: 212 mg/dL — ABNORMAL HIGH (ref 70–99)
Glucose-Capillary: 125 mg/dL — ABNORMAL HIGH (ref 70–99)
Glucose-Capillary: 174 mg/dL — ABNORMAL HIGH (ref 70–99)
Glucose-Capillary: 179 mg/dL — ABNORMAL HIGH (ref 70–99)

## 2018-02-21 LAB — CBC
HCT: 34.1 % — ABNORMAL LOW (ref 36.0–46.0)
Hemoglobin: 10.4 g/dL — ABNORMAL LOW (ref 12.0–15.0)
MCH: 28.3 pg (ref 26.0–34.0)
MCHC: 30.5 g/dL (ref 30.0–36.0)
MCV: 92.9 fL (ref 78.0–100.0)
Platelets: 197 10*3/uL (ref 150–400)
RBC: 3.67 MIL/uL — ABNORMAL LOW (ref 3.87–5.11)
RDW: 15.3 % (ref 11.5–15.5)
WBC: 10.1 10*3/uL (ref 4.0–10.5)

## 2018-02-21 MED ORDER — SENNA 8.6 MG PO TABS
1.0000 | ORAL_TABLET | Freq: Every day | ORAL | Status: DC
  Administered 2018-02-21 – 2018-02-22 (×2): 8.6 mg via ORAL
  Filled 2018-02-21 (×2): qty 1

## 2018-02-21 MED ORDER — BISACODYL 10 MG RE SUPP
10.0000 mg | Freq: Once | RECTAL | Status: AC
  Administered 2018-02-21: 10 mg via RECTAL
  Filled 2018-02-21: qty 1

## 2018-02-21 NOTE — Progress Notes (Signed)
PROGRESS NOTE    Christy May  STM:[id number] DOB: July 15, 1950 DOA: 02/11/2018 PCP: Lucianne Lei, MD    Brief Narrative: 67 year old with Body mass index is 53.74 kg/m.  DM TY 2 on oral meds, HTN, anemia, gout, COPD on chronic steroids airway syndrome under Dr. Lamonte Sakai for the past year, OSA on CPAP, reflux, ch kidney disease stage III, urge incontinence, mostly wheelchair-bound and has home health PT in the past  Noted in June 2019 was admitted with low back pain right leg weakness MRI at that time showed lumbar stenosis at multiple levels and she followed up with Dr. Arnoldo Morale  She also had a prior admission earlier in June with sepsis and renal failure. Admitted after syncope episode. She has been wheelchair bound for last 2 months due to LE weakness.    Assessment & Plan:   Active Problems:   Morbid obesity ([place])   ANEMIA-NOS   Essential hypertension   GERD   Sleep apnea   Gout   Weakness generalized   Obstructive sleep apnea   DM neuropathy, type II diabetes mellitus (HCC)   Dehydration   AKI (acute kidney injury) ([place])   Hypotension   Syncope, vasovagal   Syncope   Thoracic spondylosis with myelopathy   Spondylosis, thoracic, with myelopathy  1-Thoracic  spinal stenosis,  For surgery this afternoon.  EKG with ST changes and inversion, compare to EKG done on admission. For this reason will consult cardiology for clearance.  Underwent C 7-T1  And T 1-2 laminectomy on 8-29 Complaining of back pain, just received pain meds.  Discharge cancelled yesterday, SNF didn't have Bariatric bed.  Awaiting bariatric bed.   2-Syncope;  Presented after syncope, while been evaluated by EMS.  ECHO normal EF.  Mild elevation of troponin.  No further episodes.   Anemia; acute blood loss post surgery, expected.  Monitor hb.    Upper airway syndrome; COPD On chronic prednisone. On IV solumedrol On steroids. Nebulizer.  Stable.   DM; peripheral neuropathy;  Continue with  gabapentin.  Continue with NPH; 35 units BID>   Decubitus stage 2 sacral wound, present on admission. Wound care following.   HTN; on metoprolol.   History of gout. Continue with allopurinol.   History of dizziness; on meclizine.    DVT prophylaxis: SCD Code Status: Full code.  Family Communication: care discussed with patient.  Disposition Plan: awaiting bed  Consultants:   Cardiology    Procedures:   ECHO; normal EF   Antimicrobials: none   Subjective: Pain controlled with pain meds.    Objective: Vitals:   02/21/18 0101 02/21/18 0400 02/21/18 0900 02/21/18 0930  BP:  117/67  126/67  Pulse:  72  76  Resp: 16     Temp:  (!) 96.9 F (36.1 C) (!) 97.5 F (36.4 C)   TempSrc:  Axillary    SpO2: 97% 97%    Weight:      Height:        Intake/Output Summary (Last 24 hours) at 02/21/2018 1306 Last data filed at 02/21/2018 0944 Gross per 24 hour  Intake 900 ml  Output 2680 ml  Net -1780 ml   Filed Weights   02/11/18 1455  Weight: (!) 142 kg    Examination:  General exam: NAD Respiratory system: CTA Cardiovascular system; S 1, S 2 RRr Gastrointestinal system: BS present, soft, nt Central nervous system: alert, follows command.  Extremities:  No edema     Data Reviewed: I have personally reviewed  following labs and imaging studies  CBC: Recent Labs  Lab 02/16/18 0538 02/18/18 0403 02/19/18 0350 02/21/18 0254  WBC 9.9 14.3* 11.6* 10.1  NEUTROABS  --  11.5* 9.7*  --   HGB 11.5* 10.5* 10.3* 10.4*  HCT 36.3 34.3* 33.5* 34.1*  MCV 89.4 91.5 92.0 92.9  PLT 227 212 184 694   Basic Metabolic Panel: Recent Labs  Lab 02/16/18 0538 02/17/18 0951 02/18/18 0403 02/19/18 0350 02/21/18 0254  NA 139 139 139 139 140  K 4.5 4.2 4.7 4.8 4.1  CL 101 100 103 102 101  CO2 28 28 29 29  32  GLUCOSE 240* 271* 157* 280* 178*  BUN 26* 25* 22 24* 24*  CREATININE 1.10* 1.08* 0.97 1.01* 1.02*  CALCIUM 9.6 9.9 9.6 9.4 9.3   GFR: Estimated Creatinine  Clearance: 74.7 mL/min (A) (by C-G formula based on SCr of 1.02 mg/dL (H)). Liver Function Tests: Recent Labs  Lab 02/18/18 0403 02/19/18 0350  AST 17 12*  ALT 22 19  ALKPHOS 52 58  BILITOT 0.5 0.6  PROT 6.0* 5.8*  ALBUMIN 2.7* 2.5*   No results for input(s): LIPASE, AMYLASE in the last 168 hours. No results for input(s): AMMONIA in the last 168 hours. Coagulation Profile: No results for input(s): INR, PROTIME in the last 168 hours. Cardiac Enzymes: No results for input(s): CKTOTAL, CKMB, CKMBINDEX, TROPONINI in the last 168 hours. BNP (last 3 results) No results for input(s): PROBNP in the last 8760 hours. HbA1C: No results for input(s): HGBA1C in the last 72 hours. CBG: Recent Labs  Lab 02/20/18 1127 02/20/18 1739 02/20/18 2135 02/21/18 0713 02/21/18 1148  GLUCAP 175* 324* 276* 212* 125*   Lipid Profile: No results for input(s): CHOL, HDL, LDLCALC, TRIG, CHOLHDL, LDLDIRECT in the last 72 hours. Thyroid Function Tests: No results for input(s): TSH, T4TOTAL, FREET4, T3FREE, THYROIDAB in the last 72 hours. Anemia Panel: No results for input(s): VITAMINB12, FOLATE, FERRITIN, TIBC, IRON, RETICCTPCT in the last 72 hours. Sepsis Labs: No results for input(s): PROCALCITON, LATICACIDVEN in the last 168 hours.  Recent Results (from the past 240 hour(s))  MRSA PCR Screening     Status: Abnormal   Collection Time: 02/11/18 10:11 PM  Result Value Ref Range Status   MRSA by PCR POSITIVE (A) NEGATIVE Final    Comment:        The GeneXpert MRSA Assay (FDA approved for NASAL specimens only), is one component of a comprehensive MRSA colonization surveillance program. It is not intended to diagnose MRSA infection nor to guide or monitor treatment for MRSA infections. RESULT CALLED TO, READ BACK BY AND VERIFIED WITH: ASARO,J RN AT 8546 02/12/18 BY TIBBITTS,K Performed at [facility], [place] [address] [place], [place] [address]   Surgical PCR screen      Status: Abnormal   Collection Time: 02/17/18  1:03 AM  Result Value Ref Range Status   MRSA, PCR POSITIVE (A) NEGATIVE Final    Comment: RESULT CALLED TO, READ BACK BY AND VERIFIED WITH: A DIAZ RN [phone] 02/17/18 A BROWNING    Staphylococcus aureus POSITIVE (A) NEGATIVE Final    Comment: (NOTE) The Xpert SA Assay (FDA approved for NASAL specimens in patients 58 years of age and older), is one component of a comprehensive surveillance program. It is not intended to diagnose infection nor to guide or monitor treatment. Performed at [facility], [place] [address], [place], [place] [address]          Radiology Studies:  No results found.      Scheduled Meds: . allopurinol  300 mg Oral Daily  . bisacodyl  10 mg Rectal Once  . cyclobenzaprine  10 mg Oral Daily  . cycloSPORINE  1 drop Both Eyes BID  . docusate sodium  100 mg Oral BID  . fluticasone  2 spray Each Nare Daily  . folic acid  1 mg Oral Daily  . gabapentin  800 mg Oral TID  . Gerhardt's butt cream   Topical QID  . HYDROcodone-acetaminophen  2 tablet Oral TID  . insulin aspart  0-20 Units Subcutaneous TID WC  . insulin aspart  0-5 Units Subcutaneous QHS  . insulin NPH Human  35 Units Subcutaneous BID AC & HS  . ipratropium  2 spray Each Nare BID  . lidocaine  1 patch Transdermal Q24H  . loratadine  10 mg Oral Daily  . metoprolol tartrate  75 mg Oral BID  . montelukast  10 mg Oral QHS  . oxybutynin  10 mg Oral Daily  . pantoprazole  40 mg Oral BID  . polyethylene glycol  17 g Oral BID  . predniSONE  20 mg Oral Q breakfast  . senna  1 tablet Oral Daily  . tetrahydrozoline  1 drop Both Eyes BID   Continuous Infusions: . lactated ringers 10 mL/hr at 02/17/18 1449  . vancomycin 1,250 mg (02/21/18 0245)     LOS: 8 days    Time spent: 35 minutes.     Elmarie Shiley, MD [facility] Pager [phone]  If 7PM-7AM, please contact night-coverage [email or web address] Password [email or web address] 02/21/2018, 1:06 PM

## 2018-02-21 NOTE — Plan of Care (Signed)
  Problem: Education: Goal: Knowledge of General Education information will improve Description: Including pain rating scale, medication(s)/side effects and non-pharmacologic comfort measures Outcome: Progressing   Problem: Health Behavior/Discharge Planning: Goal: Ability to manage health-related needs will improve Outcome: Progressing   Problem: Clinical Measurements: Goal: Ability to maintain clinical measurements within normal limits will improve Outcome: Progressing   Problem: Clinical Measurements: Goal: Diagnostic test results will improve Outcome: Progressing   Problem: Clinical Measurements: Goal: Respiratory complications will improve Outcome: Progressing   

## 2018-02-21 NOTE — Social Work (Signed)
CSW f/u with [facility], pt requires bariatric bed which they were unable to have delivered during weekend. Aware pt medically stable.   [place], [place] Work [phone]

## 2018-02-21 NOTE — Progress Notes (Signed)
Physical Therapy Treatment Patient Details Name: Christy May MRN: [id number] DOB: 1950-09-13 Today's Date: 02/21/2018    History of Present Illness 67 y.o. female admitted with questionable syncopal episode. 02/17/18 C7- T1 T1-2 Laminectomy by Dr Arnoldo Morale. Pt at baseline essentially bed bound at home.  Presented 02/11/18  with questionable syncopal episode today associated with hypotension PMH: DM2 Essential HTN  anemia, gout, on chronic steroid  OSA on CPAP, gout, hx of MRSA    PT Comments    Pt admitted with above diagnosis. Pt currently with functional limitations due to the deficits listed below (see PT Problem List). Pt was only able to exercise in bed and not able to come to EOB due to max assist of 1 could not get pt to EOB.  Did well with exercises and also initiated x 1 exercises as well.   Pt will benefit from skilled PT to increase their independence and safety with mobility to allow discharge to the venue listed below.     Follow Up Recommendations  SNF;Supervision/Assistance - 24 hour     Equipment Recommendations  None recommended by PT    Recommendations for Other Services       Precautions / Restrictions Precautions Precautions: Fall Precaution Comments: JP drain in place. sacral wound  Restrictions Weight Bearing Restrictions: No    Mobility  Bed Mobility Overal bed mobility: Needs Assistance Bed Mobility: Rolling;Supine to Sit Rolling: Max assist         General bed mobility comments: Pt with difficulty rolling even with cues and assist.   Transfers                    Ambulation/Gait                 Stairs             Wheelchair Mobility    Modified Rankin (Stroke Patients Only)       Balance                                            Cognition Arousal/Alertness: Awake/alert Behavior During Therapy: WFL for tasks assessed/performed Overall Cognitive Status: Within Functional Limits for tasks  assessed                                        Exercises General Exercises - Upper Extremity Shoulder Flexion: AROM;10 reps;Supine;Both Shoulder Horizontal ABduction: AROM;10 reps;Both;Supine Shoulder Horizontal ADduction: AROM;Both;10 reps;Supine Elbow Flexion: AROM;10 reps;Both;Supine Elbow Extension: AROM;Both;10 reps;Supine General Exercises - Lower Extremity Ankle Circles/Pumps: 10 reps;Both;Supine;AROM Quad Sets: AROM;Both;10 reps;Supine Gluteal Sets: AROM;Both;5 reps;Supine Heel Slides: AAROM;Both;10 reps;Supine Hip ABduction/ADduction: AAROM;Both;10 reps;Supine    General Comments General comments (skin integrity, edema, etc.): Pt with suspected right hypofunction.  Difficult to assess fully due to immobility.  Initiated x1 exercises in supine with pt able to participate well.       Pertinent Vitals/Pain Pain Assessment: Faces Pain Score: 2  Faces Pain Scale: Hurts a little bit Pain Location: neck Pain Descriptors / Indicators: Sore;Grimacing;Operative site guarding Pain Intervention(s): Limited activity within patient's tolerance;Monitored during session;Repositioned    Home Living                      Prior Function  PT Goals (current goals can now be found in the care plan section) Acute Rehab PT Goals Patient Stated Goal: to walk Progress towards PT goals: Progressing toward goals(slowly)    Frequency    Min 3X/week      PT Plan Current plan remains appropriate    Co-evaluation              AM-PAC PT "6 Clicks" Daily Activity  Outcome Measure  Difficulty turning over in bed (including adjusting bedclothes, sheets and blankets)?: Unable Difficulty moving from lying on back to sitting on the side of the bed? : Unable Difficulty sitting down on and standing up from a chair with arms (e.g., wheelchair, bedside commode, etc,.)?: Unable Help needed moving to and from a bed to chair (including a wheelchair)?:  Total Help needed walking in hospital room?: Total Help needed climbing 3-5 steps with a railing? : Total 6 Click Score: 6    End of Session   Activity Tolerance: Patient limited by fatigue Patient left: with call bell/phone within reach;in bed Nurse Communication: Mobility status;Need for lift equipment PT Visit Diagnosis: Other abnormalities of gait and mobility (R26.89);Muscle weakness (generalized) (M62.81);Dizziness and giddiness (R42)     Time: 2993-7169 PT Time Calculation (min) (ACUTE ONLY): 24 min  Charges:  $Therapeutic Exercise: 23-37 mins                     [facility] Acute Rehabilitation [phone] (pager)    Denice Paradise 02/21/2018, 4:24 PM

## 2018-02-21 NOTE — Progress Notes (Signed)
Pharmacy Antibiotic Note  Christy May is a 67 y.o. female admitted on 02/11/2018 with surgical prophylaxis.  Pharmacy has been consulted for vanc dosing.  Damani is s/p laminectomy. She does have a hemovac drain in, therefore, we will continue vancomycin until it's out.   Scr 1.02 (stable) , Wt 142kg. Pre-op dose at 1348. Pt remains afebrile, with WBC WNL.  Plan: Vancomycin 1250 mg IV q12h  Consider vancomycin trough if needed  Height: 5\' 4"  (162.6 cm) Weight: (!) 313 lb 0.9 oz (142 kg) IBW/kg (Calculated) : 54.7  Temp (24hrs), Avg:97.8 F (36.6 C), Min:96.9 F (36.1 C), Max:98.1 F (36.7 C)  Recent Labs  Lab 02/16/18 0538 02/17/18 0951 02/18/18 0403 02/19/18 0350 02/21/18 0254  WBC 9.9  --  14.3* 11.6* 10.1  CREATININE 1.10* 1.08* 0.97 1.01* 1.02*    Estimated Creatinine Clearance: 74.7 mL/min (A) (by C-G formula based on SCr of 1.02 mg/dL (H)).    Allergies  Allergen Reactions  . Ace Inhibitors Anaphylaxis and Cough    Tolerates Irbesartan (home med)  . Penicillins Hives and Other (See Comments)    Tolerated ceftriaxone in 2019  Has patient had a PCN reaction causing immediate rash, facial/tongue/throat swelling, SOB or lightheadedness with hypotension: Yes Has patient had a PCN reaction causing severe rash involving mucus membranes or skin necrosis:  No Has patient had a PCN reaction that required hospitalization: No Has patient had a PCN reaction occurring within the last 10 years: No If all of the above answers are "NO", then may proceed with Cephalosporin use.  . Adhesive [Tape] Hives and Rash    Antimicrobials this admission: 8/29 vanc >>    Dose adjustments this admission:   Microbiology results:   Harrietta Guardian, PharmD PGY1 Pharmacy Resident 02/21/2018    8:28 AM

## 2018-02-21 NOTE — Progress Notes (Signed)
Neurosurgery Progress Note  No issues overnight. Pain manageable Eager for rehab   EXAM:  BP 117/67 (BP Location: Left Arm)   Pulse 72   Temp (!) 96.9 F (36.1 C) (Axillary)   Resp 16   Ht 5\' 4"  (1.626 m)   Wt (!) 142 kg   SpO2 97%   BMI 53.74 kg/m   Awake, alert, oriented  Speech fluent, appropriate  CN grossly intact  MAEW, LLE>RLE Incision c/d/i  PLAN Stable this am Awaiting SNF readiness

## 2018-02-22 DIAGNOSIS — M79604 Pain in right leg: Secondary | ICD-10-CM | POA: Diagnosis not present

## 2018-02-22 DIAGNOSIS — R200 Anesthesia of skin: Secondary | ICD-10-CM | POA: Diagnosis not present

## 2018-02-22 DIAGNOSIS — E1142 Type 2 diabetes mellitus with diabetic polyneuropathy: Secondary | ICD-10-CM | POA: Diagnosis not present

## 2018-02-22 DIAGNOSIS — B369 Superficial mycosis, unspecified: Secondary | ICD-10-CM | POA: Diagnosis not present

## 2018-02-22 DIAGNOSIS — Z800 Family history of malignant neoplasm of digestive organs: Secondary | ICD-10-CM | POA: Diagnosis not present

## 2018-02-22 DIAGNOSIS — Z4789 Encounter for other orthopedic aftercare: Secondary | ICD-10-CM | POA: Diagnosis not present

## 2018-02-22 DIAGNOSIS — R52 Pain, unspecified: Secondary | ICD-10-CM | POA: Diagnosis not present

## 2018-02-22 DIAGNOSIS — Z8371 Family history of colonic polyps: Secondary | ICD-10-CM | POA: Diagnosis not present

## 2018-02-22 DIAGNOSIS — Z8679 Personal history of other diseases of the circulatory system: Secondary | ICD-10-CM | POA: Diagnosis not present

## 2018-02-22 DIAGNOSIS — M109 Gout, unspecified: Secondary | ICD-10-CM | POA: Diagnosis present

## 2018-02-22 DIAGNOSIS — M797 Fibromyalgia: Secondary | ICD-10-CM | POA: Diagnosis not present

## 2018-02-22 DIAGNOSIS — R5383 Other fatigue: Secondary | ICD-10-CM | POA: Diagnosis not present

## 2018-02-22 DIAGNOSIS — R509 Fever, unspecified: Secondary | ICD-10-CM | POA: Diagnosis not present

## 2018-02-22 DIAGNOSIS — Z6841 Body Mass Index (BMI) 40.0 and over, adult: Secondary | ICD-10-CM | POA: Diagnosis not present

## 2018-02-22 DIAGNOSIS — Z96653 Presence of artificial knee joint, bilateral: Secondary | ICD-10-CM | POA: Diagnosis present

## 2018-02-22 DIAGNOSIS — J449 Chronic obstructive pulmonary disease, unspecified: Secondary | ICD-10-CM | POA: Diagnosis not present

## 2018-02-22 DIAGNOSIS — R531 Weakness: Secondary | ICD-10-CM | POA: Diagnosis not present

## 2018-02-22 DIAGNOSIS — E669 Obesity, unspecified: Secondary | ICD-10-CM | POA: Diagnosis not present

## 2018-02-22 DIAGNOSIS — M549 Dorsalgia, unspecified: Secondary | ICD-10-CM | POA: Diagnosis not present

## 2018-02-22 DIAGNOSIS — E1140 Type 2 diabetes mellitus with diabetic neuropathy, unspecified: Secondary | ICD-10-CM | POA: Diagnosis not present

## 2018-02-22 DIAGNOSIS — R251 Tremor, unspecified: Secondary | ICD-10-CM | POA: Diagnosis not present

## 2018-02-22 DIAGNOSIS — G9341 Metabolic encephalopathy: Secondary | ICD-10-CM | POA: Diagnosis not present

## 2018-02-22 DIAGNOSIS — I959 Hypotension, unspecified: Secondary | ICD-10-CM | POA: Diagnosis not present

## 2018-02-22 DIAGNOSIS — G4733 Obstructive sleep apnea (adult) (pediatric): Secondary | ICD-10-CM | POA: Diagnosis not present

## 2018-02-22 DIAGNOSIS — Z7401 Bed confinement status: Secondary | ICD-10-CM | POA: Diagnosis not present

## 2018-02-22 DIAGNOSIS — E875 Hyperkalemia: Secondary | ICD-10-CM | POA: Diagnosis not present

## 2018-02-22 DIAGNOSIS — K219 Gastro-esophageal reflux disease without esophagitis: Secondary | ICD-10-CM | POA: Diagnosis not present

## 2018-02-22 DIAGNOSIS — Y95 Nosocomial condition: Secondary | ICD-10-CM | POA: Diagnosis present

## 2018-02-22 DIAGNOSIS — Z833 Family history of diabetes mellitus: Secondary | ICD-10-CM | POA: Diagnosis not present

## 2018-02-22 DIAGNOSIS — M7989 Other specified soft tissue disorders: Secondary | ICD-10-CM | POA: Diagnosis not present

## 2018-02-22 DIAGNOSIS — Z7952 Long term (current) use of systemic steroids: Secondary | ICD-10-CM | POA: Diagnosis not present

## 2018-02-22 DIAGNOSIS — M4323 Fusion of spine, cervicothoracic region: Secondary | ICD-10-CM | POA: Diagnosis not present

## 2018-02-22 DIAGNOSIS — E1169 Type 2 diabetes mellitus with other specified complication: Secondary | ICD-10-CM | POA: Diagnosis not present

## 2018-02-22 DIAGNOSIS — E559 Vitamin D deficiency, unspecified: Secondary | ICD-10-CM | POA: Diagnosis present

## 2018-02-22 DIAGNOSIS — R42 Dizziness and giddiness: Secondary | ICD-10-CM | POA: Diagnosis not present

## 2018-02-22 DIAGNOSIS — R1310 Dysphagia, unspecified: Secondary | ICD-10-CM | POA: Diagnosis present

## 2018-02-22 DIAGNOSIS — Z87891 Personal history of nicotine dependence: Secondary | ICD-10-CM | POA: Diagnosis not present

## 2018-02-22 DIAGNOSIS — R600 Localized edema: Secondary | ICD-10-CM | POA: Diagnosis not present

## 2018-02-22 DIAGNOSIS — R55 Syncope and collapse: Secondary | ICD-10-CM | POA: Diagnosis not present

## 2018-02-22 DIAGNOSIS — Z7951 Long term (current) use of inhaled steroids: Secondary | ICD-10-CM | POA: Diagnosis not present

## 2018-02-22 DIAGNOSIS — M4804 Spinal stenosis, thoracic region: Secondary | ICD-10-CM | POA: Diagnosis not present

## 2018-02-22 DIAGNOSIS — L89313 Pressure ulcer of right buttock, stage 3: Secondary | ICD-10-CM | POA: Diagnosis not present

## 2018-02-22 DIAGNOSIS — M6281 Muscle weakness (generalized): Secondary | ICD-10-CM | POA: Diagnosis not present

## 2018-02-22 DIAGNOSIS — I10 Essential (primary) hypertension: Secondary | ICD-10-CM | POA: Diagnosis not present

## 2018-02-22 DIAGNOSIS — A419 Sepsis, unspecified organism: Secondary | ICD-10-CM | POA: Diagnosis not present

## 2018-02-22 DIAGNOSIS — R011 Cardiac murmur, unspecified: Secondary | ICD-10-CM | POA: Diagnosis present

## 2018-02-22 DIAGNOSIS — E1165 Type 2 diabetes mellitus with hyperglycemia: Secondary | ICD-10-CM | POA: Diagnosis not present

## 2018-02-22 DIAGNOSIS — R2681 Unsteadiness on feet: Secondary | ICD-10-CM | POA: Diagnosis not present

## 2018-02-22 DIAGNOSIS — Z8719 Personal history of other diseases of the digestive system: Secondary | ICD-10-CM | POA: Diagnosis not present

## 2018-02-22 DIAGNOSIS — R000 Tachycardia, unspecified: Secondary | ICD-10-CM | POA: Diagnosis not present

## 2018-02-22 DIAGNOSIS — M2550 Pain in unspecified joint: Secondary | ICD-10-CM | POA: Diagnosis not present

## 2018-02-22 DIAGNOSIS — Z90710 Acquired absence of both cervix and uterus: Secondary | ICD-10-CM | POA: Diagnosis not present

## 2018-02-22 DIAGNOSIS — J189 Pneumonia, unspecified organism: Secondary | ICD-10-CM | POA: Diagnosis not present

## 2018-02-22 DIAGNOSIS — Z9889 Other specified postprocedural states: Secondary | ICD-10-CM | POA: Diagnosis not present

## 2018-02-22 DIAGNOSIS — Z79899 Other long term (current) drug therapy: Secondary | ICD-10-CM | POA: Diagnosis not present

## 2018-02-22 DIAGNOSIS — Z981 Arthrodesis status: Secondary | ICD-10-CM | POA: Diagnosis not present

## 2018-02-22 DIAGNOSIS — L89323 Pressure ulcer of left buttock, stage 3: Secondary | ICD-10-CM | POA: Diagnosis not present

## 2018-02-22 DIAGNOSIS — G8929 Other chronic pain: Secondary | ICD-10-CM | POA: Diagnosis not present

## 2018-02-22 DIAGNOSIS — Z794 Long term (current) use of insulin: Secondary | ICD-10-CM | POA: Diagnosis not present

## 2018-02-22 DIAGNOSIS — R5381 Other malaise: Secondary | ICD-10-CM | POA: Diagnosis not present

## 2018-02-22 LAB — GLUCOSE, CAPILLARY
GLUCOSE-CAPILLARY: 197 mg/dL — AB (ref 70–99)
GLUCOSE-CAPILLARY: 225 mg/dL — AB (ref 70–99)
GLUCOSE-CAPILLARY: 239 mg/dL — AB (ref 70–99)
GLUCOSE-CAPILLARY: 289 mg/dL — AB (ref 70–99)

## 2018-02-22 MED ORDER — SENNA 8.6 MG PO TABS: 1.0000 | ORAL_TABLET | Freq: Every day | ORAL | 0 refills | Status: AC

## 2018-02-22 MED ORDER — POLYETHYLENE GLYCOL 3350 17 G PO PACK: 17.0000 g | PACK | Freq: Two times a day (BID) | ORAL | 0 refills | Status: AC

## 2018-02-22 MED ORDER — OXYCODONE HCL 5 MG PO TABS: 5.0000 mg | ORAL_TABLET | Freq: Four times a day (QID) | ORAL | 0 refills | Status: DC | PRN

## 2018-02-22 MED ORDER — CYCLOBENZAPRINE HCL 10 MG PO TABS: 10.0000 mg | ORAL_TABLET | Freq: Three times a day (TID) | ORAL | 0 refills | Status: DC | PRN

## 2018-02-22 NOTE — Discharge Summary (Signed)
Physician Discharge Summary  Christy May MVH:[id number] DOB: 1950/12/06 DOA: 02/11/2018  PCP: Lucianne Lei, MD  Admit date: 02/11/2018 Discharge date: 02/22/2018  Admitted From: Home  Disposition: SNF  Recommendations for Outpatient Follow-up:  1. Follow up with PCP in 1-2 weeks 2. Please obtain BMP/CBC in one week 3. Needs to follow up with Dr Arnoldo Morale post op.  4. Needs to work with PT/   Discharge Condition: Stable.  CODE STATUS: Full Code.  Diet recommendation: Carb Modified  Brief/Interim Summary:  1-Thoracic spinal stenosis,  For surgery this afternoon.  EKG with ST changes and inversion, compare to EKG done on admission. For this reason will consult cardiology for clearance. Underwent C 7-T1 And T 1-2 laminectomy on 8-29 She is feeling better. Pain is controlled. Plan to transfer to rehab today , if bariatric bed is available.  Had BM today.    2-Syncope;  Presented after syncope, while been evaluated by EMS.  ECHO normal EF.  Mild elevation of troponin.  Anemia; acute blood loss post surgery, expected.  Monitor hb.stable.   Upper airway syndrome; COPD On chronic prednisone.  On steroids. Nebulizer. Stable. On chronic steroids.   DM; peripheral neuropathy;  Continue with gabapentin. Continue with NPH; increase to 40  units BID>  Decubitus stage 2 sacral wound, present on admission. Wound care following.   HTN; on metoprolol. Resume avapro.   History of gout. Continue with allopurinol.   History of dizziness; on meclizine.   Discharge Diagnoses:  Active Problems:   Morbid obesity ([place])   ANEMIA-NOS   Essential hypertension   GERD   Sleep apnea   Gout   Weakness generalized   Obstructive sleep apnea   DM neuropathy, type II diabetes mellitus (HCC)   Dehydration   AKI (acute kidney injury) ([place])   Hypotension   Syncope, vasovagal   Syncope   Thoracic spondylosis with myelopathy   Spondylosis, thoracic, with  myelopathy    Discharge Instructions  Discharge Instructions    Diet - low sodium heart healthy   Complete by:  As directed    Diet Carb Modified   Complete by:  As directed    Increase activity slowly   Complete by:  As directed    Increase activity slowly   Complete by:  As directed      Allergies as of 02/22/2018      Reactions   Ace Inhibitors Anaphylaxis, Cough   Tolerates Irbesartan (home med)   Penicillins Hives, Other (See Comments)   Tolerated ceftriaxone in 2019 Has patient had a PCN reaction causing immediate rash, facial/tongue/throat swelling, SOB or lightheadedness with hypotension: Yes Has patient had a PCN reaction causing severe rash involving mucus membranes or skin necrosis:  No Has patient had a PCN reaction that required hospitalization: No Has patient had a PCN reaction occurring within the last 10 years: No If all of the above answers are "NO", then may proceed with Cephalosporin use.   Adhesive [tape] Hives, Rash      Medication List    STOP taking these medications   aspirin EC 81 MG tablet   traMADol 50 MG tablet Commonly known as:  ULTRAM     TAKE these medications   allopurinol 300 MG tablet Commonly known as:  ZYLOPRIM Take 300 mg by mouth daily.   bisacodyl 10 MG suppository Commonly known as:  DULCOLAX Place 1 suppository (10 mg total) rectally daily as needed for moderate constipation.   cyclobenzaprine 10 MG tablet Commonly known as:  FLEXERIL Take 1 tablet (10 mg total) by mouth 3 (three) times daily as needed for muscle spasms. What changed:    when to take this  reasons to take this   cycloSPORINE 0.05 % ophthalmic emulsion Commonly known as:  RESTASIS Place 1 drop into both eyes 2 (two) times daily.   docusate sodium 100 MG capsule Commonly known as:  COLACE Take 1 capsule (100 mg total) by mouth 2 (two) times daily.   fluticasone 50 MCG/ACT nasal spray Commonly known as:  FLONASE SHAKE LIQUID AND USE 2 SPRAYS IN  EACH NOSTRIL DAILY What changed:  See the new instructions.   folic acid 1 MG tablet Commonly known as:  FOLVITE Take 1 mg by mouth once a day   gabapentin 800 MG tablet Commonly known as:  NEURONTIN Take 800 mg by mouth 3 (three) times daily.   Gerhardt's butt cream Crea Apply 1 application topically 4 (four) times daily.   HYDROcodone-acetaminophen 5-325 MG tablet Commonly known as:  NORCO/VICODIN Take 2 tablets by mouth every 6 (six) hours as needed for moderate pain. What changed:    how much to take  reasons to take this   insulin NPH Human 100 UNIT/ML injection Commonly known as:  HUMULIN N,NOVOLIN N Inject 0.4 mLs (40 Units total) into the skin 2 (two) times daily at 8 am and 10 pm. What changed:    how much to take  when to take this   INSULIN SYRINGE 1CC/31GX5/16" 31G X 5/16" 1 ML Misc USE TO INJECT INSULIN TWICE DAILY   ipratropium 0.03 % nasal spray Commonly known as:  ATROVENT USE 2 SPRAYS IN EACH NOSTRIL THREE TIMES DAILY AS NEEDED FOR RHINITIS What changed:  See the new instructions.   irbesartan 300 MG tablet Commonly known as:  AVAPRO Take 300 mg by mouth daily.   lidocaine 5 % Commonly known as:  LIDODERM Place 1 patch onto the skin daily. Remove & Discard patch within 12 hours or as directed by MD   loratadine 10 MG tablet Commonly known as:  CLARITIN Take 10 mg by mouth daily.   meclizine 25 MG tablet Commonly known as:  ANTIVERT Take 1 tablet (25 mg total) by mouth 3 (three) times daily as needed for dizziness.   metFORMIN 500 MG tablet Commonly known as:  GLUCOPHAGE Take 1 tablet (500 mg total) by mouth 2 (two) times daily with a meal.   metoprolol tartrate 50 MG tablet Commonly known as:  LOPRESSOR Take 75 mg by mouth 2 (two) times daily.   montelukast 10 MG tablet Commonly known as:  SINGULAIR TAKE 1 TABLET(10 MG) BY MOUTH DAILY What changed:  See the new instructions.   multivitamin with minerals tablet Take 1 tablet by  mouth daily.   nystatin cream Commonly known as:  MYCOSTATIN Apply 1 application topically 2 (two) times daily as needed for dry skin (skin irritation).   ondansetron 8 MG disintegrating tablet Commonly known as:  ZOFRAN-ODT Take 1 tablet (8 mg total) by mouth every 8 (eight) hours as needed for nausea or vomiting.   ONE TOUCH LANCETS Misc Use to check sugars twice daily DX E11.22   oxybutynin 10 MG 24 hr tablet Commonly known as:  DITROPAN-XL Take 1 tablet (10 mg total) by mouth daily.   oxyCODONE 5 MG immediate release tablet Commonly known as:  Oxy IR/ROXICODONE Take 1 tablet (5 mg total) by mouth every 6 (six) hours as needed for breakthrough pain ((score 4 to 6)).  pantoprazole 40 MG tablet Commonly known as:  PROTONIX TAKE 1 TABLET(40 MG) BY MOUTH TWICE DAILY What changed:  See the new instructions.   polyethylene glycol packet Commonly known as:  MIRALAX / GLYCOLAX Take 17 g by mouth 2 (two) times daily.   predniSONE 20 MG tablet Commonly known as:  DELTASONE TAKE 1 TABLET(20 MG) BY MOUTH DAILY WITH BREAKFAST What changed:  Another medication with the same name was removed. Continue taking this medication, and follow the directions you see here.   ranitidine 300 MG tablet Commonly known as:  ZANTAC Take 1 tablet (300 mg total) by mouth 2 (two) times daily.   senna 8.6 MG Tabs tablet Commonly known as:  SENOKOT Take 1 tablet (8.6 mg total) by mouth daily. Start taking on:  02/23/2018   sodium chloride 0.65 % Soln nasal spray Commonly known as:  OCEAN Place 2 sprays into both nostrils 2 (two) times daily.   TURMERIC PO Take 1 tablet by mouth daily.   VISINE OP Apply to eye.   Vitamin D-3 1000 units Caps Take 2,000 Units by mouth daily.      Contact information for after-discharge care    Destination    [facility] .   Service:  Skilled Nursing Contact information: [address] [place]  [place] [phone]             Allergies  Allergen Reactions  . Ace Inhibitors Anaphylaxis and Cough    Tolerates Irbesartan (home med)  . Penicillins Hives and Other (See Comments)    Tolerated ceftriaxone in 2019  Has patient had a PCN reaction causing immediate rash, facial/tongue/throat swelling, SOB or lightheadedness with hypotension: Yes Has patient had a PCN reaction causing severe rash involving mucus membranes or skin necrosis:  No Has patient had a PCN reaction that required hospitalization: No Has patient had a PCN reaction occurring within the last 10 years: No If all of the above answers are "NO", then may proceed with Cephalosporin use.  . Adhesive [Tape] Hives and Rash    Consultations: Cardiology Neurosurgery   Procedures/Studies: Ct Head Wo Contrast  Result Date: 02/06/2018 CLINICAL DATA:  Dizziness. EXAM: CT HEAD WITHOUT CONTRAST TECHNIQUE: Contiguous axial images were obtained from the base of the skull through the vertex without intravenous contrast. COMPARISON:  11/17/2017 FINDINGS: Brain: No evidence of acute infarction, hemorrhage, hydrocephalus, extra-axial collection or mass lesion/mass effect. Mild cerebral atrophy. Vascular: Moderate intracranial arterial vascular calcifications are present. Skull: Calvarium appears intact. Sinuses/Orbits: Paranasal sinuses and mastoid air cells are clear. Other: None. IMPRESSION: No acute intracranial abnormality. Electronically Signed   By: Lucienne Capers M.D.   On: 02/06/2018 01:34   Mr Jodene Nam Head Wo Contrast  Result Date: 02/06/2018 CLINICAL DATA:  Vertigo EXAM: MR HEAD WITHOUT CONTRAST MR CIRCLE OF WILLIS WITHOUT CONTRAST MRA OF THE NECK WITHOUT AND WITH CONTRAST TECHNIQUE: Multiplanar, multiecho pulse sequences of the brain, circle of willis and surrounding structures were obtained without intravenous contrast. Angiographic images of the neck were obtained using MRA technique without and with intravenous contrast.  CONTRAST:  59mL MULTIHANCE GADOBENATE DIMEGLUMINE 529 MG/ML IV SOLN COMPARISON:  CT head 02/06/2018. FINDINGS: MR HEAD FINDINGS Brain: Ventricle size normal. Negative for acute infarct. Mild chronic microvascular ischemic change in the white matter. Negative for hemorrhage mass or fluid collection. Vascular: Normal arterial flow voids Skull and upper cervical spine: Negative Sinuses/Orbits: Bilateral cataract surgery. Paranasal sinuses clear. Other: None MR CIRCLE OF WILLIS FINDINGS Left vertebral  artery dominant and widely patent to the basilar basilar. Left PICA patent. Right vertebral artery is hypoplastic and ends in PICA. This is most likely a congenital variant. Basilar widely patent. Superior cerebellar and posterior cerebral arteries patent bilaterally. Patent posterior communicating arteries bilaterally. Internal carotid artery patent bilaterally without significant stenosis. Anterior and middle cerebral arteries patent bilaterally without significant stenosis. Negative for cerebral aneurysm. MRA NECK FINDINGS Antegrade flow in the carotid and vertebral arteries bilaterally. Suboptimal image quality due to timing of the injection. Carotid artery is patent bilaterally. No stenosis identified although anatomic detail is suboptimal. Left vertebral dominant and widely patent. Small right vertebral artery ends in PICA, likely congenital. IMPRESSION: 1. No acute intracranial abnormality. Mild chronic microvascular ischemic change in the white matter 2. No significant intracranial stenosis or large vessel occlusion. 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 Suboptimal MRA neck without significant stenosis. Electronically Signed   By: Franchot Gallo M.D.   On: 02/06/2018 11:33   Mr Angiogram Neck W Or Wo Contrast  Result Date: 02/06/2018 CLINICAL DATA:  Vertigo EXAM: MR HEAD WITHOUT CONTRAST MR CIRCLE OF WILLIS WITHOUT CONTRAST MRA OF THE NECK WITHOUT AND WITH CONTRAST TECHNIQUE:  Multiplanar, multiecho pulse sequences of the brain, circle of willis and surrounding structures were obtained without intravenous contrast. Angiographic images of the neck were obtained using MRA technique without and with intravenous contrast. CONTRAST:  63mL MULTIHANCE GADOBENATE DIMEGLUMINE 529 MG/ML IV SOLN COMPARISON:  CT head 02/06/2018. FINDINGS: MR HEAD FINDINGS Brain: Ventricle size normal. Negative for acute infarct. Mild chronic microvascular ischemic change in the white matter. Negative for hemorrhage mass or fluid collection. Vascular: Normal arterial flow voids Skull and upper cervical spine: Negative Sinuses/Orbits: Bilateral cataract surgery. Paranasal sinuses clear. Other: None MR CIRCLE OF WILLIS FINDINGS Left vertebral artery dominant and widely patent to the basilar basilar. Left PICA patent. Right vertebral artery is hypoplastic and ends in PICA. This is most likely a congenital variant. Basilar widely patent. Superior cerebellar and posterior cerebral arteries patent bilaterally. Patent posterior communicating arteries bilaterally. Internal carotid artery patent bilaterally without significant stenosis. Anterior and middle cerebral arteries patent bilaterally without significant stenosis. Negative for cerebral aneurysm. MRA NECK FINDINGS Antegrade flow in the carotid and vertebral arteries bilaterally. Suboptimal image quality due to timing of the injection. Carotid artery is patent bilaterally. No stenosis identified although anatomic detail is suboptimal. Left vertebral dominant and widely patent. Small right vertebral artery ends in PICA, likely congenital. IMPRESSION: 1. No acute intracranial abnormality. Mild chronic microvascular ischemic change in the white matter 2. No significant intracranial stenosis or large vessel occlusion. 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 Suboptimal MRA neck without significant stenosis. Electronically Signed   By: Franchot Gallo  M.D.   On: 02/06/2018 11:33   Mr Brain Wo Contrast  Result Date: 02/06/2018 CLINICAL DATA:  Vertigo EXAM: MR HEAD WITHOUT CONTRAST MR CIRCLE OF WILLIS WITHOUT CONTRAST MRA OF THE NECK WITHOUT AND WITH CONTRAST TECHNIQUE: Multiplanar, multiecho pulse sequences of the brain, circle of willis and surrounding structures were obtained without intravenous contrast. Angiographic images of the neck were obtained using MRA technique without and with intravenous contrast. CONTRAST:  48mL MULTIHANCE GADOBENATE DIMEGLUMINE 529 MG/ML IV SOLN COMPARISON:  CT head 02/06/2018. FINDINGS: MR HEAD FINDINGS Brain: Ventricle size normal. Negative for acute infarct. Mild chronic microvascular ischemic change in the white matter. Negative for hemorrhage mass or fluid collection. Vascular: Normal arterial flow voids Skull and upper cervical spine: Negative Sinuses/Orbits: Bilateral cataract  surgery. Paranasal sinuses clear. Other: None MR CIRCLE OF WILLIS FINDINGS Left vertebral artery dominant and widely patent to the basilar basilar. Left PICA patent. Right vertebral artery is hypoplastic and ends in PICA. This is most likely a congenital variant. Basilar widely patent. Superior cerebellar and posterior cerebral arteries patent bilaterally. Patent posterior communicating arteries bilaterally. Internal carotid artery patent bilaterally without significant stenosis. Anterior and middle cerebral arteries patent bilaterally without significant stenosis. Negative for cerebral aneurysm. MRA NECK FINDINGS Antegrade flow in the carotid and vertebral arteries bilaterally. Suboptimal image quality due to timing of the injection. Carotid artery is patent bilaterally. No stenosis identified although anatomic detail is suboptimal. Left vertebral dominant and widely patent. Small right vertebral artery ends in PICA, likely congenital. IMPRESSION: 1. No acute intracranial abnormality. Mild chronic microvascular ischemic change in the white matter  2. No significant intracranial stenosis or large vessel occlusion. 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 Suboptimal MRA neck without significant stenosis. Electronically Signed   By: Franchot Gallo M.D.   On: 02/06/2018 11:33   Mr Cervical Spine Wo Contrast  Result Date: 02/14/2018 CLINICAL DATA:  Weakness and multiple falls EXAM: MRI CERVICAL AND THORACIC SPINE WITHOUT CONTRAST TECHNIQUE: Multiplanar and multiecho pulse sequences of the cervical and thoracic spine were obtained without intravenous contrast. COMPARISON:  None. FINDINGS: MRI CERVICAL SPINE FINDINGS Alignment: Physiologic. Vertebrae: There is ACDF hardware extending from C3-C7. Cord: Normal signal and morphology. Posterior Fossa, vertebral arteries, paraspinal tissues: Negative. Disc levels: C2-C3: Small central disc protrusion and mild bilateral facet hypertrophy. No spinal canal stenosis. Mild right foraminal stenosis. C3-C4: Postfusion changes without stenosis. C4-C5: Postfusion changes without stenosis. C5-C6: Postfusion changes without stenosis. C6-C7: Postfusion changes without stenosis. C7-T1: No spinal canal stenosis. Mild narrowing of the right neural foramen. MRI THORACIC SPINE FINDINGS The due to patient body habitus, axial images were not able to be obtained below the C7-T1 level. Alignment: Normal Vertebrae: No fracture, evidence of discitis, or bone lesion. Cord: There is focal hyperintense T2-weighted signal within the spinal cord at the T1-T2 level. The remainder of the spinal cord is normal. Paraspinal and other soft tissues: Negative. Disc levels: Assessment of disc spaces is limited due to the lack of axial imaging. There is severe spinal canal stenosis at the T1-T2 level, predominantly due to posterior osseous spurring. There is no other high-grade spinal canal stenosis. There are small disc herniations at T9-10 and T10-11. IMPRESSION: 1. Examination limited by patient body habitus. Axial images of the  thoracic spine were not able to be obtained. 2. Severe spinal canal stenosis at T1-T2, predominantly caused by posterior osseous spurring. Signal changes within the spinal cord at this level likely indicate myelomalacia. 3. No high-grade cervical stenosis. Electronically Signed   By: Ulyses Jarred M.D.   On: 02/14/2018 19:47   Mr Thoracic Spine Wo Contrast  Result Date: 02/15/2018 CLINICAL DATA:  Follow-up upper thoracic spinal stenosis. History of ACDF. EXAM: MRI THORACIC SPINE WITHOUT CONTRAST TECHNIQUE: Axial, multisequence MR imaging of the thoracic spine was performed. No intravenous contrast was administered. COMPARISON:  MRI cervical and thoracic spine February 14, 2018 FINDINGS: Axial sequences through upper thoracic spine. Severe canal stenosis T1-2, AP dimension of the canal is 5 mm, low signal RIGHT posterolateral epidural space most compatible with calcified ligamentum flavum. Cervical spinal cord deformity without definite edema. No syrinx. Mild canal stenosis T3-4. Nondiagnostic assessment of neural foraminal narrowing on axial sequences. IMPRESSION: 1. Limited axial series through upper thoracic spine. 2. Severe canal  stenosis at T1-2 with cord compression without definite cord edema. Electronically Signed   By: Elon Alas M.D.   On: 02/15/2018 18:54   Mr Thoracic Spine Wo Contrast  Result Date: 02/14/2018 CLINICAL DATA:  Weakness and multiple falls EXAM: MRI CERVICAL AND THORACIC SPINE WITHOUT CONTRAST TECHNIQUE: Multiplanar and multiecho pulse sequences of the cervical and thoracic spine were obtained without intravenous contrast. COMPARISON:  None. FINDINGS: MRI CERVICAL SPINE FINDINGS Alignment: Physiologic. Vertebrae: There is ACDF hardware extending from C3-C7. Cord: Normal signal and morphology. Posterior Fossa, vertebral arteries, paraspinal tissues: Negative. Disc levels: C2-C3: Small central disc protrusion and mild bilateral facet hypertrophy. No spinal canal stenosis. Mild  right foraminal stenosis. C3-C4: Postfusion changes without stenosis. C4-C5: Postfusion changes without stenosis. C5-C6: Postfusion changes without stenosis. C6-C7: Postfusion changes without stenosis. C7-T1: No spinal canal stenosis. Mild narrowing of the right neural foramen. MRI THORACIC SPINE FINDINGS The due to patient body habitus, axial images were not able to be obtained below the C7-T1 level. Alignment: Normal Vertebrae: No fracture, evidence of discitis, or bone lesion. Cord: There is focal hyperintense T2-weighted signal within the spinal cord at the T1-T2 level. The remainder of the spinal cord is normal. Paraspinal and other soft tissues: Negative. Disc levels: Assessment of disc spaces is limited due to the lack of axial imaging. There is severe spinal canal stenosis at the T1-T2 level, predominantly due to posterior osseous spurring. There is no other high-grade spinal canal stenosis. There are small disc herniations at T9-10 and T10-11. IMPRESSION: 1. Examination limited by patient body habitus. Axial images of the thoracic spine were not able to be obtained. 2. Severe spinal canal stenosis at T1-T2, predominantly caused by posterior osseous spurring. Signal changes within the spinal cord at this level likely indicate myelomalacia. 3. No high-grade cervical stenosis. Electronically Signed   By: Ulyses Jarred M.D.   On: 02/14/2018 19:47   Dg Thoracic Spine 1 View  Result Date: 02/17/2018 CLINICAL DATA:  Intraoperative localization for laminectomy EXAM: THORACIC SPINE - 1 VIEW COMPARISON:  None. FLUOROSCOPY TIME:  Radiation Exposure Index (as provided by the fluoroscopic device): Not available If the device does not provide the exposure index: Fluoroscopy Time:  Not available Number of Acquired Images:  1 FINDINGS: Frontal film over the upper thoracic spine demonstrates a curved instrument overlying the T1-2 interspace. The anatomy is based on the inferior aspect of a prior cervical fusion plate  extending to T7. No lateral imaging was obtained. IMPRESSION: Intraoperative localization at T1-2. These findings were called to OR 20 at the time of exam interpretation. Electronically Signed   By: Inez Catalina M.D.   On: 02/17/2018 16:50   Dg Chest Portable 1 View  Result Date: 02/11/2018 CLINICAL DATA:  Hypertension. EXAM: PORTABLE CHEST 1 VIEW COMPARISON:  Radiograph December 14, 2017. FINDINGS: Stable cardiomediastinal silhouette. No pneumothorax or pleural effusion is noted. Both lungs are clear. The visualized skeletal structures are unremarkable. IMPRESSION: No acute cardiopulmonary abnormality seen. Electronically Signed   By: Marijo Conception, M.D.   On: 02/11/2018 16:35   Dg C-arm 1-60 Min  Result Date: 02/17/2018 CLINICAL DATA:  Thoracic localization. EXAM: DG C-ARM 61-120 MIN COMPARISON:  None. FINDINGS: Fluoroscopy was provided for thoracic localization. IMPRESSION: As above Electronically Signed   By: Margarette Canada M.D.   On: 02/17/2018 19:30      Subjective: Went too long without pain meds. Back pain this am.  Denies dyspnea.   Discharge Exam: Vitals:   02/22/18 0800 02/22/18  0843  BP: (!) 146/74 (!) 146/74  Pulse: 73 79  Resp: 10   Temp:    SpO2: 95%    Vitals:   02/22/18 0129 02/22/18 0500 02/22/18 0800 02/22/18 0843  BP:   (!) 146/74 (!) 146/74  Pulse:   73 79  Resp: 17  10   Temp:  (!) 97.4 F (36.3 C)    TempSrc:  Axillary    SpO2: 96%  95%   Weight:      Height:        General: Pt is alert, awake, not in acute distress Cardiovascular: RRR, S1/S2 +, no rubs, no gallops Respiratory: CTA bilaterally, no wheezing, no rhonchi Abdominal: Soft, NT, ND, bowel sounds + Extremities: no edema, no cyanosis    The results of significant diagnostics from this hospitalization (including imaging, microbiology, ancillary and laboratory) are listed below for reference.     Microbiology: Recent Results (from the past 240 hour(s))  Surgical PCR screen     Status:  Abnormal   Collection Time: 02/17/18  1:03 AM  Result Value Ref Range Status   MRSA, PCR POSITIVE (A) NEGATIVE Final    Comment: RESULT CALLED TO, READ BACK BY AND VERIFIED WITH: A DIAZ RN [phone] 02/17/18 A BROWNING    Staphylococcus aureus POSITIVE (A) NEGATIVE Final    Comment: (NOTE) The Xpert SA Assay (FDA approved for NASAL specimens in patients 56 years of age and older), is one component of a comprehensive surveillance program. It is not intended to diagnose infection nor to guide or monitor treatment. Performed at [facility], [place] [address] [place], [place] [address]      Labs: BNP (last 3 results) No results for input(s): BNP in the last 8760 hours. Basic Metabolic Panel: Recent Labs  Lab 02/16/18 0538 02/17/18 0951 02/18/18 0403 02/19/18 0350 02/21/18 0254  NA 139 139 139 139 140  K 4.5 4.2 4.7 4.8 4.1  CL 101 100 103 102 101  CO2 28 28 29 29  32  GLUCOSE 240* 271* 157* 280* 178*  BUN 26* 25* 22 24* 24*  CREATININE 1.10* 1.08* 0.97 1.01* 1.02*  CALCIUM 9.6 9.9 9.6 9.4 9.3   Liver Function Tests: Recent Labs  Lab 02/18/18 0403 02/19/18 0350  AST 17 12*  ALT 22 19  ALKPHOS 52 58  BILITOT 0.5 0.6  PROT 6.0* 5.8*  ALBUMIN 2.7* 2.5*   No results for input(s): LIPASE, AMYLASE in the last 168 hours. No results for input(s): AMMONIA in the last 168 hours. CBC: Recent Labs  Lab 02/16/18 0538 02/18/18 0403 02/19/18 0350 02/21/18 0254  WBC 9.9 14.3* 11.6* 10.1  NEUTROABS  --  11.5* 9.7*  --   HGB 11.5* 10.5* 10.3* 10.4*  HCT 36.3 34.3* 33.5* 34.1*  MCV 89.4 91.5 92.0 92.9  PLT 227 212 184 197   Cardiac Enzymes: No results for input(s): CKTOTAL, CKMB, CKMBINDEX, TROPONINI in the last 168 hours. BNP: Invalid input(s): POCBNP CBG: Recent Labs  Lab 02/21/18 0713 02/21/18 1148 02/21/18 1626 02/21/18 2138 02/22/18 0812  GLUCAP 212* 125* 174* 179* 197*   D-Dimer No results for input(s): DDIMER in the last 72 hours. Hgb A1c No results for  input(s): HGBA1C in the last 72 hours. Lipid Profile No results for input(s): CHOL, HDL, LDLCALC, TRIG, CHOLHDL, LDLDIRECT in the last 72 hours. Thyroid function studies No results for input(s): TSH, T4TOTAL, T3FREE, THYROIDAB in the last 72 hours.  Invalid input(s): FREET3 Anemia work up No results for input(s):  VITAMINB12, FOLATE, FERRITIN, TIBC, IRON, RETICCTPCT in the last 72 hours. Urinalysis    Component Value Date/Time   COLORURINE YELLOW 02/11/2018 1541   APPEARANCEUR HAZY (A) 02/11/2018 1541   LABSPEC 1.013 02/11/2018 1541   PHURINE 5.0 02/11/2018 1541   GLUCOSEU 150 (A) 02/11/2018 1541   GLUCOSEU NEGATIVE 12/14/2011 1707   HGBUR NEGATIVE 02/11/2018 1541   BILIRUBINUR NEGATIVE 02/11/2018 1541   BILIRUBINUR n 11/16/2014 1515   KETONESUR 5 (A) 02/11/2018 1541   PROTEINUR NEGATIVE 02/11/2018 1541   UROBILINOGEN 0.2 02/27/2015 2226   NITRITE NEGATIVE 02/11/2018 1541   LEUKOCYTESUR TRACE (A) 02/11/2018 1541   Sepsis Labs Invalid input(s): PROCALCITONIN,  WBC,  LACTICIDVEN Microbiology Recent Results (from the past 240 hour(s))  Surgical PCR screen     Status: Abnormal   Collection Time: 02/17/18  1:03 AM  Result Value Ref Range Status   MRSA, PCR POSITIVE (A) NEGATIVE Final    Comment: RESULT CALLED TO, READ BACK BY AND VERIFIED WITH: A DIAZ RN [phone] 02/17/18 A BROWNING    Staphylococcus aureus POSITIVE (A) NEGATIVE Final    Comment: (NOTE) The Xpert SA Assay (FDA approved for NASAL specimens in patients 33 years of age and older), is one component of a comprehensive surveillance program. It is not intended to diagnose infection nor to guide or monitor treatment. Performed at [facility], [place] [address] [place], [place] [address]      Time coordinating discharge: 35 minutes.   SIGNED:   Elmarie Shiley, MD  [facility] 02/22/2018, 9:06 AM Pager   If 7PM-7AM, please contact night-coverage [email or web address] Password [email or web address]

## 2018-02-22 NOTE — Progress Notes (Signed)
1600 Called report to [place], RN @ [facility]. Patient states she is ready to go home but does not want to leave to early like she did last time .

## 2018-02-22 NOTE — Social Work (Addendum)
Bed has been delivered to [facility], discussed with pt outstanding bill at [facility] pt states she will pay it but requests SNF f/u with her.  Pt summary sent to [facility].   11:47am- Clinical Social Worker facilitated patient discharge including contacting patient family and facility to confirm patient discharge plans.  Clinical information faxed to facility and family agreeable with plan.  CSW arranged ambulance transport via [facility] to [facility] RN to call [phone] with report  prior to discharge.  Clinical Social Worker will sign off for now as social work intervention is no longer needed. Please consult [place] again if new need arises.  Alexander Mt, [place] Social Worker [phone]

## 2018-02-22 NOTE — Progress Notes (Signed)
Neurosurgery Progress Note  No issues overnight. Complains of appropriate back pain  EXAM:  BP (!) 146/74   Pulse 79   Temp 98.5 F (36.9 C)   Resp 10   Ht 5\' 4"  (1.626 m)   Wt (!) 142 kg   SpO2 95%   BMI 53.74 kg/m   Awake, alert, oriented  Speech fluent, appropriate  MAEW, LLE>RLE Incision c/d/i  PLAN Stable this am Awaiting SNF placement

## 2018-02-22 NOTE — Clinical Social Work Placement (Addendum)
   CLINICAL SOCIAL WORK PLACEMENT  NOTE Isaias Cowman RN to call [phone] with report  prior to discharge.    Date:  02/22/2018  Patient Details  Name: Christy May MRN: [id number] Date of Birth: 19-Jun-1951  Clinical Social Work is seeking post-discharge placement for this patient at the [place] level of care (*CSW will initial, date and re-position this form in  chart as items are completed):  Yes   Patient/family provided with [place] Work Department's list of facilities offering this level of care within the geographic area requested by the patient (or if unable, by the patient's family).  Yes   Patient/family informed of their freedom to choose among providers that offer the needed level of care, that participate in Medicare, Medicaid or managed care program needed by the patient, have an available bed and are willing to accept the patient.  Yes   Patient/family informed of [facility]'s ownership interest in [facility] and [facility], as well as of the fact that they are under no obligation to receive care at these facilities.  PASRR submitted to EDS on       PASRR number received on 02/17/18     Existing PASRR number confirmed on       FL2 transmitted to all facilities in geographic area requested by pt/family on 02/17/18     FL2 transmitted to all facilities within larger geographic area on       Patient informed that his/her managed care company has contracts with or will negotiate with certain facilities, including the following:        Yes   Patient/family informed of bed offers received.  Patient chooses bed at [facility]     Physician recommends and patient chooses bed at      Patient to be transferred to [facility] on 02/22/18.  Patient to be transferred to facility by [facility]     Patient family notified on 02/22/18 of transfer.  Name of family member notified:  pt responsible for self       PHYSICIAN       Additional Comment:    _______________________________________________ Alexander Mt, LCSWA 02/22/2018, 11:52 AM

## 2018-02-22 NOTE — Progress Notes (Signed)
Physical Therapy Treatment Patient Details Name: Christy May MRN: [id number] DOB: 20-Oct-1950 Today's Date: 02/22/2018    History of Present Illness 67 y.o. female admitted with questionable syncopal episode. 02/17/18 C7- T1 T1-2 Laminectomy by Dr Arnoldo Morale. Pt at baseline essentially bed bound at home.  Presented 02/11/18  with questionable syncopal episode today associated with hypotension PMH: DM2 Essential HTN  anemia, gout, on chronic steroid  OSA on CPAP, gout, hx of MRSA    PT Comments    Patient progressing with mobility slowly due to continued LE weakness, though feel she may have achieved little more liftoff this session than on evaluation with attempts at sit to stand.  She demonstrates no signs of central vertigo, but may definitely have a component of positional vertigo, but hard to treat in bari bed in patient with recent cervical surgery.  PT to follow acutely, continue to recommend SNF at d/c.    Follow Up Recommendations  SNF;Supervision/Assistance - 24 hour     Equipment Recommendations  None recommended by PT    Recommendations for Other Services       Precautions / Restrictions Precautions Precautions: Fall Precaution Comments: JP drain in place. sacral wound     Mobility  Bed Mobility Overal bed mobility: Needs Assistance Bed Mobility: Rolling;Sidelying to Sit;Sit to Sidelying Rolling: Mod assist;+2 for safety/equipment Sidelying to sit: Max assist;+2 for safety/equipment     Sit to sidelying: Max assist;+2 for safety/equipment;+2 for physical assistance General bed mobility comments: rolling for hygiene and BM with dizziness throughout (worst with rolling); side to sit max A for trunk and LE's; max A +2 sit to side for trunk and legs  Transfers Overall transfer level: Needs assistance   Transfers: Sit to/from Stand Sit to Stand: +2 physical assistance;Max assist;From elevated surface;+2 safety/equipment         General transfer comment: attempted x  4 for LE strengthening, able to achieve liftoff momentarily, but could not sustain; BIlat knees blocked and using pad under her hips to lift  Ambulation/Gait                 Stairs             Wheelchair Mobility    Modified Rankin (Stroke Patients Only)       Balance Overall balance assessment: Needs assistance Sitting-balance support: Feet supported Sitting balance-Leahy Scale: Poor Sitting balance - Comments: UE support or min/mod A for sitting EOB                                    Cognition Arousal/Alertness: Awake/alert Behavior During Therapy: WFL for tasks assessed/performed Overall Cognitive Status: Within Functional Limits for tasks assessed                                        Exercises      General Comments General comments (skin integrity, edema, etc.): Performed oculomotor testing in sitting, not significant for central or even peripheral except did not perform head thrust due to recent cervical surgery; pt did perform x 1 sitting gaze stabilization 40-50 seconds horizontal and vertical with symptoms no greater than 3/10'      Pertinent Vitals/Pain Faces Pain Scale: Hurts even more Pain Location: neck and shoulders Pain Descriptors / Indicators: Sore;Grimacing;Operative site guarding Pain Intervention(s): Monitored during session;Repositioned;Premedicated before session  Home Living                      Prior Function            PT Goals (current goals can now be found in the care plan section) Progress towards PT goals: Progressing toward goals    Frequency    Min 3X/week      PT Plan Current plan remains appropriate    Co-evaluation              AM-PAC PT "6 Clicks" Daily Activity  Outcome Measure  Difficulty turning over in bed (including adjusting bedclothes, sheets and blankets)?: Unable Difficulty moving from lying on back to sitting on the side of the bed? :  Unable Difficulty sitting down on and standing up from a chair with arms (e.g., wheelchair, bedside commode, etc,.)?: Unable Help needed moving to and from a bed to chair (including a wheelchair)?: Total Help needed walking in hospital room?: Total Help needed climbing 3-5 steps with a railing? : Total 6 Click Score: 6    End of Session Equipment Utilized During Treatment: Gait belt Activity Tolerance: Patient limited by fatigue Patient left: with call bell/phone within reach;in bed Nurse Communication: Mobility status       Time: 1420-1520 PT Time Calculation (min) (ACUTE ONLY): 60 min  Charges:  $Therapeutic Activity: 38-52 mins $Neuromuscular Re-education: 8-22 mins                     [place], [place] 02/22/2018    Reginia Naas 02/22/2018, 4:23 PM

## 2018-02-24 DIAGNOSIS — Z9889 Other specified postprocedural states: Secondary | ICD-10-CM | POA: Diagnosis not present

## 2018-02-24 DIAGNOSIS — E669 Obesity, unspecified: Secondary | ICD-10-CM | POA: Diagnosis not present

## 2018-02-24 DIAGNOSIS — R52 Pain, unspecified: Secondary | ICD-10-CM | POA: Diagnosis not present

## 2018-02-24 DIAGNOSIS — M4804 Spinal stenosis, thoracic region: Secondary | ICD-10-CM | POA: Diagnosis not present

## 2018-02-26 ENCOUNTER — Other Ambulatory Visit: Payer: Self-pay | Admitting: Endocrinology

## 2018-02-26 NOTE — Telephone Encounter (Signed)
Please refill x 1 Ov is due  

## 2018-03-02 DIAGNOSIS — I10 Essential (primary) hypertension: Secondary | ICD-10-CM | POA: Diagnosis not present

## 2018-03-02 DIAGNOSIS — E1165 Type 2 diabetes mellitus with hyperglycemia: Secondary | ICD-10-CM | POA: Diagnosis not present

## 2018-03-02 DIAGNOSIS — R52 Pain, unspecified: Secondary | ICD-10-CM | POA: Diagnosis not present

## 2018-03-02 DIAGNOSIS — M4804 Spinal stenosis, thoracic region: Secondary | ICD-10-CM | POA: Diagnosis not present

## 2018-03-07 DIAGNOSIS — I10 Essential (primary) hypertension: Secondary | ICD-10-CM | POA: Diagnosis not present

## 2018-03-07 DIAGNOSIS — E669 Obesity, unspecified: Secondary | ICD-10-CM | POA: Diagnosis not present

## 2018-03-07 DIAGNOSIS — M797 Fibromyalgia: Secondary | ICD-10-CM | POA: Diagnosis not present

## 2018-03-07 DIAGNOSIS — E1140 Type 2 diabetes mellitus with diabetic neuropathy, unspecified: Secondary | ICD-10-CM | POA: Diagnosis not present

## 2018-03-08 DIAGNOSIS — R251 Tremor, unspecified: Secondary | ICD-10-CM | POA: Diagnosis not present

## 2018-03-08 DIAGNOSIS — M4804 Spinal stenosis, thoracic region: Secondary | ICD-10-CM | POA: Diagnosis not present

## 2018-03-08 DIAGNOSIS — E1165 Type 2 diabetes mellitus with hyperglycemia: Secondary | ICD-10-CM | POA: Diagnosis not present

## 2018-03-08 DIAGNOSIS — R000 Tachycardia, unspecified: Secondary | ICD-10-CM | POA: Diagnosis not present

## 2018-03-09 DIAGNOSIS — R251 Tremor, unspecified: Secondary | ICD-10-CM | POA: Diagnosis not present

## 2018-03-09 DIAGNOSIS — E1140 Type 2 diabetes mellitus with diabetic neuropathy, unspecified: Secondary | ICD-10-CM | POA: Diagnosis not present

## 2018-03-09 DIAGNOSIS — R000 Tachycardia, unspecified: Secondary | ICD-10-CM | POA: Diagnosis not present

## 2018-03-12 ENCOUNTER — Emergency Department (HOSPITAL_BASED_OUTPATIENT_CLINIC_OR_DEPARTMENT_OTHER): Payer: Medicare Other

## 2018-03-12 ENCOUNTER — Emergency Department (HOSPITAL_COMMUNITY)
Admission: EM | Admit: 2018-03-12 | Discharge: 2018-03-12 | Disposition: A | Discharge status: Home / Self Care | Payer: Medicare Other | Attending: Emergency Medicine | Admitting: Emergency Medicine

## 2018-03-12 ENCOUNTER — Encounter (HOSPITAL_COMMUNITY): Payer: Self-pay | Admitting: Emergency Medicine

## 2018-03-12 DIAGNOSIS — Z794 Long term (current) use of insulin: Secondary | ICD-10-CM | POA: Insufficient documentation

## 2018-03-12 DIAGNOSIS — Z79899 Other long term (current) drug therapy: Secondary | ICD-10-CM | POA: Insufficient documentation

## 2018-03-12 DIAGNOSIS — M7989 Other specified soft tissue disorders: Secondary | ICD-10-CM | POA: Diagnosis not present

## 2018-03-12 DIAGNOSIS — E1140 Type 2 diabetes mellitus with diabetic neuropathy, unspecified: Secondary | ICD-10-CM | POA: Diagnosis not present

## 2018-03-12 DIAGNOSIS — M79604 Pain in right leg: Secondary | ICD-10-CM | POA: Insufficient documentation

## 2018-03-12 DIAGNOSIS — Z87891 Personal history of nicotine dependence: Secondary | ICD-10-CM | POA: Insufficient documentation

## 2018-03-12 DIAGNOSIS — R200 Anesthesia of skin: Secondary | ICD-10-CM

## 2018-03-12 DIAGNOSIS — I10 Essential (primary) hypertension: Secondary | ICD-10-CM | POA: Insufficient documentation

## 2018-03-12 LAB — CBC WITH DIFFERENTIAL/PLATELET
Abs Immature Granulocytes: 0.1 10*3/uL (ref 0.0–0.1)
BASOS PCT: 0 %
Basophils Absolute: 0 10*3/uL (ref 0.0–0.1)
EOS ABS: 0.1 10*3/uL (ref 0.0–0.7)
Eosinophils Relative: 1 %
HCT: 34.2 % — ABNORMAL LOW (ref 36.0–46.0)
Hemoglobin: 10.2 g/dL — ABNORMAL LOW (ref 12.0–15.0)
IMMATURE GRANULOCYTES: 1 %
Lymphocytes Relative: 17 %
Lymphs Abs: 1.7 10*3/uL (ref 0.7–4.0)
MCH: 27.3 pg (ref 26.0–34.0)
MCHC: 29.8 g/dL — ABNORMAL LOW (ref 30.0–36.0)
MCV: 91.7 fL (ref 78.0–100.0)
Monocytes Absolute: 0.8 10*3/uL (ref 0.1–1.0)
Monocytes Relative: 9 %
NEUTROS ABS: 7.2 10*3/uL (ref 1.7–7.7)
NEUTROS PCT: 72 %
PLATELETS: 276 10*3/uL (ref 150–400)
RBC: 3.73 MIL/uL — AB (ref 3.87–5.11)
RDW: 15.2 % (ref 11.5–15.5)
WBC: 9.9 10*3/uL (ref 4.0–10.5)

## 2018-03-12 LAB — CBG MONITORING, ED: GLUCOSE-CAPILLARY: 93 mg/dL (ref 70–99)

## 2018-03-12 LAB — I-STAT CHEM 8, ED
BUN: 29 mg/dL — ABNORMAL HIGH (ref 8–23)
Calcium, Ion: 1.24 mmol/L (ref 1.15–1.40)
Chloride: 99 mmol/L (ref 98–111)
Creatinine, Ser: 1.2 mg/dL — ABNORMAL HIGH (ref 0.44–1.00)
Glucose, Bld: 113 mg/dL — ABNORMAL HIGH (ref 70–99)
HEMATOCRIT: 32 % — AB (ref 36.0–46.0)
HEMOGLOBIN: 10.9 g/dL — AB (ref 12.0–15.0)
Potassium: 4.7 mmol/L (ref 3.5–5.1)
SODIUM: 137 mmol/L (ref 135–145)
TCO2: 31 mmol/L (ref 22–32)

## 2018-03-12 NOTE — ED Notes (Signed)
Vitals put in by tech on wrong patient

## 2018-03-12 NOTE — ED Notes (Signed)
[facility] called for pickup of patient

## 2018-03-12 NOTE — ED Provider Notes (Signed)
[facility] EMERGENCY DEPARTMENT Provider Note   CSN: [id number] Arrival date & time: 03/12/18  0945     History   Chief Complaint Chief Complaint  Patient presents with  . Leg Pain    right    HPI Christy May is a 67 y.o. female.  The history is provided by the patient and medical records. No language interpreter was used.  Leg Pain       67 year old female with history of diabetes, chronic back pain, gout brought here via EMS from [facility] for R leg pain.  Patient is a poor historian, history is difficult to obtain.  Patient has thoracic spinal stenosis status post laminectomy by Dr. Arnoldo Morale on 8/29.  Patient is currently at [facility] for rehab.  She mentions since surgery she has had numbness to her right lower extremity.  She also complaining of throbbing pain to her right leg.  She is unable to elicit more information about her ER visit today aside from pain and numbness to the leg.  She has mostly bedbound.  No history of blood clots in the past.  She denies any fever chills chest pain trouble breathing of breath or cough.  She denies any significant back pain.  Denies any recent injury.  Level 5 caveats   Past Medical History:  Diagnosis Date  . Allergy   . Anemia, unspecified   . Anxiety   . Arthritis    "neck, back, hands" (09/03/2014)  . Chronic airway obstruction, not elsewhere classified    "I was told I don't have this/tests done @ [facility] 05/2014"  . Chronic back pain   . Colon polyps   . Complication of anesthesia    "I've had recall; I probably stopped breathing at least 2 times" (09/03/2014)  . Dehydration   . Depressive disorder, not elsewhere classified   . Diabetic peripheral neuropathy ([place])   . Dysphagia 2007   historyof dysphagia with severe dysmotility by barium swallow-[name]  . Esophageal reflux   . Family history of adverse reaction to anesthesia    "my brother was told they lost him a couple times  during OR"  . Heart murmur   . History of hiatal hernia   . Migraine    "couple times/month right now" (09/03/2014)  . Obesity, unspecified   . OSA (obstructive sleep apnea)     CPAP  . Septic shock ([place]) 08/30/2017  . Spondylosis of unspecified site without mention of myelopathy   . Type II diabetes mellitus ([place])   . Unspecified essential hypertension   . Unspecified menopausal and postmenopausal disorder   . Unspecified vitamin D deficiency   . Upper airway cough syndrome     Patient Active Problem List   Diagnosis Date Noted  . Thoracic spondylosis with myelopathy 02/17/2018  . Spondylosis, thoracic, with myelopathy 02/17/2018  . Dehydration 02/11/2018  . AKI (acute kidney injury) ([place]) 02/11/2018  . Hypotension 02/11/2018  . Syncope, vasovagal 02/11/2018  . Syncope 02/11/2018  . DM neuropathy, type II diabetes mellitus ([place]) 11/18/2017  . Weakness 11/18/2017  . SIRS (systemic inflammatory response syndrome) ([place]) 11/17/2017  . Diabetes mellitus type 2 in obese ([place]) 11/17/2017  . Acute lower UTI 11/17/2017  . Septic shock ([place]) 08/30/2017  . Acute renal failure (ARF) ([place]) 08/13/2017  . Pressure injury of skin 08/13/2017  . Sepsis due to gram-negative UTI ([place]) 08/12/2017  . Upper airway cough syndrome 09/28/2016  . Acute bronchitis 10/29/2015  . Diabetes (  [place]) 08/21/2015  . Chest pain 07/14/2015  . Dyspnea on exertion 07/05/2015  . Obstructive sleep apnea 07/05/2015  . Constipation 03/05/2015  . Allergic rhinitis 03/05/2015  . Hearing loss 03/05/2015  . Vocal cord dysfunction 03/05/2015  . Pain in joint, shoulder region 09/11/2014  . Weakness generalized 09/02/2014  . Acute esophagitis 05/08/2014  . Benign neoplasm of sigmoid colon 05/08/2014  . Change in bowel habits 04/17/2014  . Fecal incontinence 04/17/2014  . Urge incontinence 04/08/2014  . Cough 12/06/2013  . Orthostasis 07/18/2013  . Acute kidney injury ([place]) 07/18/2013  . Bilateral carpal tunnel  syndrome 02/14/2013  . Gout 01/04/2013  . Failure to thrive 06/28/2012  . Gait disorder 06/28/2012  . Lower abdominal pain 12/14/2011  . Preventative health care 08/23/2011  . Cervical radiculopathy 02/11/2011  . Low back pain 02/11/2011  . VITAMIN D DEFICIENCY 11/22/2009  . ANEMIA-NOS 11/22/2009  . DEGENERATIVE JOINT DISEASE, CERVICAL SPINE 11/22/2009  . Depression 09/16/2009  . POLYNEUROPATHY 09/16/2009  . Morbid obesity ([place]) 09/09/2009  . Essential hypertension 09/09/2009  . GERD 09/09/2009  . Sleep apnea 09/09/2009    Past Surgical History:  Procedure Laterality Date  . ABDOMINAL HYSTERECTOMY     "partial"  . ANTERIOR CERVICAL DECOMP/DISCECTOMY FUSION     multiple cervical spine levels;Dr. Arnoldo Morale  . APPENDECTOMY    . BACK SURGERY    . CHOLECYSTECTOMY OPEN    . COLONOSCOPY N/A 05/08/2014   Procedure: COLONOSCOPY;  Surgeon: [name], MD;  Location: [facility];  Service: Endoscopy;  Laterality: N/A;  . DILATION AND CURETTAGE OF UTERUS    . ESOPHAGOGASTRODUODENOSCOPY N/A 05/08/2014   Procedure: ESOPHAGOGASTRODUODENOSCOPY (EGD);  Surgeon: [name], MD;  Location: [name];  Service: Endoscopy;  Laterality: N/A;  . EXPLORATORY LAPAROTOMY    . EYE SURGERY    . FOOT SURGERY Right X 2   "took blood out of my arms and put platelets in my feet"  . INCISION AND DRAINAGE ABSCESS     Chest  . JOINT REPLACEMENT    . REFRACTIVE SURGERY Bilateral   . THORACIC DISCECTOMY N/A 02/17/2018   Procedure: THORACIC ONE - THORACIC TWO LAMINECTOMY;  Surgeon: Newman Pies, MD;  Location: [place];  Service: Neurosurgery;  Laterality: N/A;  THORACIC ONE - THORACIC TWO LAMINECTOMY  . TONSILLECTOMY    . TOTAL KNEE ARTHROPLASTY Left 10/2003  . TOTAL KNEE ARTHROPLASTY Right 01/2004   [name]     OB History   None      Home Medications    Prior to Admission medications   Medication Sig Start Date End Date Taking? Authorizing Provider  allopurinol (ZYLOPRIM) 300 MG tablet  Take 300 mg by mouth daily.    [provider]  bisacodyl (DULCOLAX) 10 MG suppository Place 1 suppository (10 mg total) rectally daily as needed for moderate constipation. 02/19/18   Regalado, Belkys A, MD  Cholecalciferol (VITAMIN D-3) 1000 units CAPS Take 2,000 Units by mouth daily.    [provider]  cyclobenzaprine (FLEXERIL) 10 MG tablet Take 1 tablet (10 mg total) by mouth 3 (three) times daily as needed for muscle spasms. 02/22/18   Regalado, Belkys A, MD  cycloSPORINE (RESTASIS) 0.05 % ophthalmic emulsion Place 1 drop into both eyes 2 (two) times daily.     [provider]  docusate sodium (COLACE) 100 MG capsule Take 1 capsule (100 mg total) by mouth 2 (two) times daily. 02/19/18   Regalado, Belkys A, MD  fluticasone (FLONASE) 50 MCG/ACT nasal  spray SHAKE LIQUID AND USE 2 SPRAYS IN EACH NOSTRIL DAILY Patient taking differently: Place 2 sprays into both nostrils daily.  03/08/17   Collene Gobble, MD  folic acid (FOLVITE) 1 MG tablet Take 1 mg by mouth once a day 09/03/17   Aline August, MD  gabapentin (NEURONTIN) 800 MG tablet Take 800 mg by mouth 3 (three) times daily.    [provider]  HYDROcodone-acetaminophen (NORCO/VICODIN) 5-325 MG tablet Take 2 tablets by mouth every 6 (six) hours as needed for moderate pain. 02/19/18   Regalado, Belkys A, MD  Hydrocortisone (GERHARDT'S BUTT CREAM) CREA Apply 1 application topically 4 (four) times daily. 02/19/18   Regalado, Belkys A, MD  insulin NPH Human (NOVOLIN N RELION) 100 UNIT/ML injection Inject 0.4 mLs (40 Units total) into the skin 2 (two) times daily at 8 am and 10 pm. 02/19/18   Regalado, Belkys A, MD  Insulin Syringe-Needle U-100 (INSULIN SYRINGE 1CC/31GX5/16") 31G X 5/16" 1 ML MISC USE TO INJECT INSULIN TWICE DAILY 05/11/17   Renato Shin, MD  ipratropium (ATROVENT) 0.03 % nasal spray USE 2 SPRAYS IN EACH NOSTRIL THREE TIMES DAILY AS NEEDED FOR RHINITIS Patient taking differently: Place 2 sprays into both  nostrils 2 (two) times daily.  03/17/17   Collene Gobble, MD  irbesartan (AVAPRO) 300 MG tablet Take 300 mg by mouth daily.    [provider]  lidocaine (LIDODERM) 5 % Place 1 patch onto the skin daily. Remove & Discard patch within 12 hours or as directed by MD 02/19/18   Regalado, Jerald Kief A, MD  loratadine (CLARITIN) 10 MG tablet Take 10 mg by mouth daily.    [provider]  meclizine (ANTIVERT) 25 MG tablet Take 1 tablet (25 mg total) by mouth 3 (three) times daily as needed for dizziness. 02/06/18   Dorie Rank, MD  metFORMIN (GLUCOPHAGE) 500 MG tablet TAKE 1 TABLET(500 MG) BY MOUTH TWICE DAILY WITH A MEAL 02/28/18   Renato Shin, MD  metoprolol (LOPRESSOR) 50 MG tablet Take 75 mg by mouth 2 (two) times daily.    [provider]  montelukast (SINGULAIR) 10 MG tablet TAKE 1 TABLET(10 MG) BY MOUTH DAILY Patient taking differently: Take 10 mg by mouth at bedtime.  04/03/16   Magdalen Spatz, NP  Multiple Vitamins-Minerals (MULTIVITAMIN WITH MINERALS) tablet Take 1 tablet by mouth daily.    [provider]  nystatin cream (MYCOSTATIN) Apply 1 application topically 2 (two) times daily as needed for dry skin (skin irritation).    [provider]  ondansetron (ZOFRAN ODT) 8 MG disintegrating tablet Take 1 tablet (8 mg total) by mouth every 8 (eight) hours as needed for nausea or vomiting. 02/06/18   Dorie Rank, MD  ONE [facility] LANCETS MISC Use to check sugars twice daily DX E11.22 08/23/17   Renato Shin, MD  oxybutynin (DITROPAN-XL) 10 MG 24 hr tablet Take 1 tablet (10 mg total) by mouth daily. 02/18/15   Biagio Borg, MD  oxyCODONE (OXY IR/ROXICODONE) 5 MG immediate release tablet Take 1 tablet (5 mg total) by mouth every 6 (six) hours as needed for breakthrough pain ((score 4 to 6)). 02/22/18   Regalado, Belkys A, MD  pantoprazole (PROTONIX) 40 MG tablet TAKE 1 TABLET(40 MG) BY MOUTH TWICE DAILY Patient taking differently: Take 40 mg by mouth 2 (two) times daily.   03/02/17   Collene Gobble, MD  polyethylene glycol (MIRALAX / GLYCOLAX) packet Take 17 g by mouth 2 (two) times daily. 02/22/18  Regalado, Belkys A, MD  predniSONE (DELTASONE) 20 MG tablet TAKE 1 TABLET(20 MG) BY MOUTH DAILY WITH BREAKFAST Patient not taking: Reported on 02/11/2018 02/02/18   Collene Gobble, MD  ranitidine (ZANTAC) 300 MG tablet Take 1 tablet (300 mg total) by mouth 2 (two) times daily. 11/22/17   Debbe Odea, MD  senna (SENOKOT) 8.6 MG TABS tablet Take 1 tablet (8.6 mg total) by mouth daily. 02/23/18   Regalado, Belkys A, MD  sodium chloride (OCEAN) 0.65 % SOLN nasal spray Place 2 sprays into both nostrils 2 (two) times daily.    [provider]  Tetrahydrozoline HCl (VISINE OP) Apply to eye.    [provider]  TURMERIC PO Take 1 tablet by mouth daily.    [provider]    Family History Family History  Problem Relation Age of Onset  . Esophageal cancer Brother   . Esophageal cancer Sister        ?  . Colon polyps Brother   . Pancreatic cancer Sister        ?  . Diabetes Sister   . Diabetes Unknown        Aunt and Uncle  . Heart disease Maternal Grandfather     Social History Social History   Tobacco Use  . Smoking status: Former Smoker    Packs/day: 1.00    Years: 20.00    Pack years: 20.00    Types: Cigarettes    Last attempt to quit: 06/22/1986    Years since quitting: 31.7  . Smokeless tobacco: Never Used  Substance Use Topics  . Alcohol use: Yes  . Drug use: No     Allergies   Ace inhibitors; Penicillins; and Adhesive [tape]   Review of Systems Review of Systems  Unable to perform ROS: Other     Physical Exam Updated Vital Signs BP (!) 107/52 (BP Location: Left Arm)   Pulse 91   Temp 98.7 F (37.1 C) (Oral)   SpO2 94%   Physical Exam  Constitutional: She appears well-developed and well-nourished. No distress.  Obese female laying in bed nontoxic in appearance  HENT:  Head: Atraumatic.  Eyes: Conjunctivae  are normal.  Neck: Neck supple.  Cardiovascular: Normal rate and regular rhythm.  Pulmonary/Chest: Breath sounds normal.  Musculoskeletal: She exhibits edema (RLE: 1+ pitting edema to right lower extremity as compared to left.  Leg compartment is soft but mildly tender throughout.  Difficult to appreciate dorsalis pedis pulse.  Leg is slightly cooler compared to left, cap refill less than 3 seconds.).  Neurological: She is alert.  Patellar deep tendon reflex intact bilaterally, no foot drops, 4 out of 5 strength to right lower extremity, 5 out of 5 strength left lower extremity  Skin: No rash noted.  Psychiatric: She has a normal mood and affect.  Nursing note and vitals reviewed.    ED Treatments / Results  Labs (all labs ordered are listed, but only abnormal results are displayed) Labs Reviewed  CBC WITH DIFFERENTIAL/PLATELET - Abnormal; Notable for the following components:      Result Value   RBC 3.73 (*)    Hemoglobin 10.2 (*)    HCT 34.2 (*)    MCHC 29.8 (*)    All other components within normal limits  I-STAT CHEM 8, ED - Abnormal; Notable for the following components:   BUN 29 (*)    Creatinine, Ser 1.20 (*)    Glucose, Bld 113 (*)    Hemoglobin 10.9 (*)  HCT 32.0 (*)    All other components within normal limits    EKG None  Radiology No results found.  Procedures Procedures (including critical care time)  VASCULAR LAB PRELIMINARY  PRELIMINARY  PRELIMINARY  PRELIMINARY  Right lower extremity venous duplex completed.    Preliminary report:  There is no obvious evidence of DVT or SVT noted in the visualized veins of the right lower extremity.  Called results to Domenic Moras, PA-C  KANADY, Hodgkins, RVT 03/12/2018, 1:19 PM    Medications Ordered in ED Medications - No data to display   Initial Impression / Assessment and Plan / ED Course  I have reviewed the triage vital signs and the nursing notes.  Pertinent labs & imaging results that were  available during my care of the patient were reviewed by me and considered in my medical decision making (see chart for details).     BP (!) 107/52 (BP Location: Left Arm)   Pulse 91   Temp 98.7 F (37.1 C) (Oral)   SpO2 94%    Final Clinical Impressions(s) / ED Diagnoses   Final diagnoses:  Right leg pain    ED Discharge Orders    None     10:23 AM Patient complaining of right leg pain along with numbness for the past several days.  She is mostly bedbound.  She recently had cervical laminectomy last month.  She does not have any significant history of DVT in the past.  Her pain is not consistent with gout.  She does have diminished dorsalis pedal pulse on exam therefore will obtain bedside ultrasound to check her arterial flow.  Doppler venous ultrasound ordered.  1:33 PM  Bedside Doppler study did detect pedal pulse on the right foot. Further preliminary report; there is no obvious evidence of DVT or SVT noted in the visualized vein of the right lower extremity.  I discussed this finding with patient.  I encourage patient to follow-up with her primary care provider for further evaluation of her condition.  Otherwise she is stable for discharge.  Patient voiced understanding and agrees with plan.  Return precautions discussed.   Domenic Moras, PA-C 03/12/18 1343    Duffy Bruce, MD 03/13/18 [phone]

## 2018-03-12 NOTE — Discharge Instructions (Signed)
You have been evaluated for your right leg pain and swelling.  No evidence of blood clot in your right leg.  Your labs are

## 2018-03-12 NOTE — ED Triage Notes (Signed)
Pt arrives from [facility] for eval of several days of entire right leg pain.

## 2018-03-12 NOTE — ED Notes (Signed)
Pt transported to vascular.  °

## 2018-03-12 NOTE — Progress Notes (Signed)
VASCULAR LAB PRELIMINARY  PRELIMINARY  PRELIMINARY  PRELIMINARY  Right lower extremity venous duplex completed.    Preliminary report:  There is no obvious evidence of DVT or SVT noted in the visualized veins of the right lower extremity.  Called results to Domenic Moras, PA-C  Kanaan Kagawa, RVT 03/12/2018, 1:19 PM

## 2018-03-12 NOTE — ED Notes (Signed)
[facility] at bedside to take patient.  Patient states she feels as if her sugar is dropping.  This RN checked sugar prior to transport, 96.  Will continue with transport.

## 2018-03-12 NOTE — ED Notes (Signed)
Doppler used to find pulse in right foot, marked with skin marker

## 2018-03-16 ENCOUNTER — Encounter (HOSPITAL_COMMUNITY): Payer: Self-pay | Admitting: Emergency Medicine

## 2018-03-16 ENCOUNTER — Other Ambulatory Visit: Payer: Self-pay | Admitting: *Deleted

## 2018-03-16 ENCOUNTER — Other Ambulatory Visit: Payer: Self-pay

## 2018-03-16 ENCOUNTER — Emergency Department (HOSPITAL_COMMUNITY)
Admission: EM | Admit: 2018-03-16 | Discharge: 2018-03-17 | Disposition: A | Discharge status: Home / Self Care | Payer: Medicare Other | Attending: Emergency Medicine | Admitting: Emergency Medicine

## 2018-03-16 ENCOUNTER — Emergency Department (HOSPITAL_BASED_OUTPATIENT_CLINIC_OR_DEPARTMENT_OTHER): Payer: Medicare Other

## 2018-03-16 DIAGNOSIS — Z79899 Other long term (current) drug therapy: Secondary | ICD-10-CM | POA: Insufficient documentation

## 2018-03-16 DIAGNOSIS — R609 Edema, unspecified: Secondary | ICD-10-CM

## 2018-03-16 DIAGNOSIS — R600 Localized edema: Secondary | ICD-10-CM | POA: Insufficient documentation

## 2018-03-16 DIAGNOSIS — R739 Hyperglycemia, unspecified: Secondary | ICD-10-CM

## 2018-03-16 DIAGNOSIS — E1140 Type 2 diabetes mellitus with diabetic neuropathy, unspecified: Secondary | ICD-10-CM | POA: Insufficient documentation

## 2018-03-16 DIAGNOSIS — E875 Hyperkalemia: Secondary | ICD-10-CM

## 2018-03-16 DIAGNOSIS — Z8679 Personal history of other diseases of the circulatory system: Secondary | ICD-10-CM | POA: Insufficient documentation

## 2018-03-16 DIAGNOSIS — M7989 Other specified soft tissue disorders: Secondary | ICD-10-CM

## 2018-03-16 DIAGNOSIS — Z794 Long term (current) use of insulin: Secondary | ICD-10-CM | POA: Insufficient documentation

## 2018-03-16 DIAGNOSIS — E1165 Type 2 diabetes mellitus with hyperglycemia: Secondary | ICD-10-CM | POA: Diagnosis not present

## 2018-03-16 LAB — COMPREHENSIVE METABOLIC PANEL
ALBUMIN: 2.4 g/dL — AB (ref 3.5–5.0)
ALT: 21 U/L (ref 0–44)
ANION GAP: 11 (ref 5–15)
AST: 14 U/L — ABNORMAL LOW (ref 15–41)
Alkaline Phosphatase: 79 U/L (ref 38–126)
BUN: 20 mg/dL (ref 8–23)
CHLORIDE: 99 mmol/L (ref 98–111)
CO2: 25 mmol/L (ref 22–32)
Calcium: 9.5 mg/dL (ref 8.9–10.3)
Creatinine, Ser: 1.04 mg/dL — ABNORMAL HIGH (ref 0.44–1.00)
GFR calc non Af Amer: 54 mL/min — ABNORMAL LOW (ref >60)
GLUCOSE: 320 mg/dL — AB (ref 70–99)
Potassium: 5.6 mmol/L — ABNORMAL HIGH (ref 3.5–5.1)
SODIUM: 135 mmol/L (ref 135–145)
Total Bilirubin: 0.3 mg/dL (ref 0.3–1.2)
Total Protein: 7 g/dL (ref 6.5–8.1)

## 2018-03-16 LAB — CBC WITH DIFFERENTIAL/PLATELET
Abs Immature Granulocytes: 0.2 10*3/uL — ABNORMAL HIGH (ref 0.0–0.1)
BASOS ABS: 0 10*3/uL (ref 0.0–0.1)
BASOS PCT: 0 %
Eosinophils Absolute: 0 10*3/uL (ref 0.0–0.7)
Eosinophils Relative: 0 %
HCT: 33.1 % — ABNORMAL LOW (ref 36.0–46.0)
Hemoglobin: 9.8 g/dL — ABNORMAL LOW (ref 12.0–15.0)
Immature Granulocytes: 1 %
LYMPHS PCT: 10 %
Lymphs Abs: 1 10*3/uL (ref 0.7–4.0)
MCH: 26.8 pg (ref 26.0–34.0)
MCHC: 29.6 g/dL — ABNORMAL LOW (ref 30.0–36.0)
MCV: 90.4 fL (ref 78.0–100.0)
MONO ABS: 0.7 10*3/uL (ref 0.1–1.0)
Monocytes Relative: 6 %
NEUTROS PCT: 83 %
Neutro Abs: 8.5 10*3/uL — ABNORMAL HIGH (ref 1.7–7.7)
PLATELETS: 301 10*3/uL (ref 150–400)
RBC: 3.66 MIL/uL — AB (ref 3.87–5.11)
RDW: 15.4 % (ref 11.5–15.5)
WBC: 10.4 10*3/uL (ref 4.0–10.5)

## 2018-03-16 LAB — APTT: APTT: 29 s (ref 24–36)

## 2018-03-16 LAB — PROTIME-INR
INR: 1.07
Prothrombin Time: 13.8 seconds (ref 11.4–15.2)

## 2018-03-16 LAB — POTASSIUM: Potassium: 5.2 mmol/L — ABNORMAL HIGH (ref 3.5–5.1)

## 2018-03-16 NOTE — ED Notes (Signed)
[facility] called for transport.  

## 2018-03-16 NOTE — ED Notes (Signed)
Patient verbalizes understanding of discharge instructions. Opportunity for questioning and answers were provided. Armband removed by staff, pt discharged from ED to [facility] via [place].

## 2018-03-16 NOTE — ED Triage Notes (Addendum)
Pt with [facility] has been c/o right leg pain. A [place] scan was done at [facility] and it showed a possible DVT in the right femoral vein (paperwork at bedside). Reports numbness, PMS intact. VSS  cbg 402 140/88 80 pulse 96% RA 98.6 Temp

## 2018-03-16 NOTE — Progress Notes (Signed)
Right lower extremity venous duplex completed. - Preliminary results - There is no evidence of a DVT or Baker's cyst. Christy May,RVS 03/16/2018 8:31 PM

## 2018-03-16 NOTE — ED Provider Notes (Signed)
[facility] EMERGENCY DEPARTMENT Provider Note   CSN: [id number] Arrival date & time: 03/16/18  1625     History   Chief Complaint Chief Complaint  Patient presents with  . Leg Pain    HPI Christy May is a 67 y.o. female.  HPI Patient referred from rehab facility for possible right lower extremity DVT.  Had ultrasound provided at the facility suggesting DVT in the right femoral vein.  Patient says she is had 4 days of lower extremity swelling and discomfort.  No new weakness or numbness.  Patient has had previous back surgery.  She denies any shortness of breath or chest pain. Past Medical History:  Diagnosis Date  . Allergy   . Anemia, unspecified   . Anxiety   . Arthritis    "neck, back, hands" (09/03/2014)  . Chronic airway obstruction, not elsewhere classified    "I was told I don't have this/tests done @ [facility] 05/2014"  . Chronic back pain   . Colon polyps   . Complication of anesthesia    "I've had recall; I probably stopped breathing at least 2 times" (09/03/2014)  . Dehydration   . Depressive disorder, not elsewhere classified   . Diabetic peripheral neuropathy ([place])   . Dysphagia 2007   historyof dysphagia with severe dysmotility by barium swallow-[name]  . Esophageal reflux   . Family history of adverse reaction to anesthesia    "my brother was told they lost him a couple times during OR"  . Heart murmur   . History of hiatal hernia   . Migraine    "couple times/month right now" (09/03/2014)  . Obesity, unspecified   . OSA (obstructive sleep apnea)     CPAP  . Septic shock ([place]) 08/30/2017  . Spondylosis of unspecified site without mention of myelopathy   . Type II diabetes mellitus ([place])   . Unspecified essential hypertension   . Unspecified menopausal and postmenopausal disorder   . Unspecified vitamin D deficiency   . Upper airway cough syndrome     Patient Active Problem List   Diagnosis Date Noted  . Thoracic  spondylosis with myelopathy 02/17/2018  . Spondylosis, thoracic, with myelopathy 02/17/2018  . Dehydration 02/11/2018  . AKI (acute kidney injury) ([place]) 02/11/2018  . Hypotension 02/11/2018  . Syncope, vasovagal 02/11/2018  . Syncope 02/11/2018  . DM neuropathy, type II diabetes mellitus ([place]) 11/18/2017  . Weakness 11/18/2017  . SIRS (systemic inflammatory response syndrome) ([place]) 11/17/2017  . Diabetes mellitus type 2 in obese ([place]) 11/17/2017  . Acute lower UTI 11/17/2017  . Septic shock ([place]) 08/30/2017  . Acute renal failure (ARF) ([place]) 08/13/2017  . Pressure injury of skin 08/13/2017  . Sepsis due to gram-negative UTI ([place]) 08/12/2017  . Upper airway cough syndrome 09/28/2016  . Acute bronchitis 10/29/2015  . Diabetes ([place]) 08/21/2015  . Chest pain 07/14/2015  . Dyspnea on exertion 07/05/2015  . Obstructive sleep apnea 07/05/2015  . Constipation 03/05/2015  . Allergic rhinitis 03/05/2015  . Hearing loss 03/05/2015  . Vocal cord dysfunction 03/05/2015  . Pain in joint, shoulder region 09/11/2014  . Weakness generalized 09/02/2014  . Acute esophagitis 05/08/2014  . Benign neoplasm of sigmoid colon 05/08/2014  . Change in bowel habits 04/17/2014  . Fecal incontinence 04/17/2014  . Urge incontinence 04/08/2014  . Cough 12/06/2013  . Orthostasis 07/18/2013  . Acute kidney injury ([place]) 07/18/2013  . Bilateral carpal tunnel syndrome 02/14/2013  . Gout 01/04/2013  . Failure to thrive  06/28/2012  . Gait disorder 06/28/2012  . Lower abdominal pain 12/14/2011  . Preventative health care 08/23/2011  . Cervical radiculopathy 02/11/2011  . Low back pain 02/11/2011  . VITAMIN D DEFICIENCY 11/22/2009  . ANEMIA-NOS 11/22/2009  . DEGENERATIVE JOINT DISEASE, CERVICAL SPINE 11/22/2009  . Depression 09/16/2009  . POLYNEUROPATHY 09/16/2009  . Morbid obesity ([place]) 09/09/2009  . Essential hypertension 09/09/2009  . GERD 09/09/2009  . Sleep apnea 09/09/2009    Past Surgical  History:  Procedure Laterality Date  . ABDOMINAL HYSTERECTOMY     "partial"  . ANTERIOR CERVICAL DECOMP/DISCECTOMY FUSION     multiple cervical spine levels;Dr. Arnoldo Morale  . APPENDECTOMY    . BACK SURGERY    . CHOLECYSTECTOMY OPEN    . COLONOSCOPY N/A 05/08/2014   Procedure: COLONOSCOPY;  Surgeon: [name], MD;  Location: [facility] ENDOSCOPY;  Service: Endoscopy;  Laterality: N/A;  . DILATION AND CURETTAGE OF UTERUS    . ESOPHAGOGASTRODUODENOSCOPY N/A 05/08/2014   Procedure: ESOPHAGOGASTRODUODENOSCOPY (EGD);  Surgeon: [name], MD;  Location: [name];  Service: Endoscopy;  Laterality: N/A;  . EXPLORATORY LAPAROTOMY    . EYE SURGERY    . FOOT SURGERY Right X 2   "took blood out of my arms and put platelets in my feet"  . INCISION AND DRAINAGE ABSCESS     Chest  . JOINT REPLACEMENT    . REFRACTIVE SURGERY Bilateral   . THORACIC DISCECTOMY N/A 02/17/2018   Procedure: THORACIC ONE - THORACIC TWO LAMINECTOMY;  Surgeon: Newman Pies, MD;  Location: [facility];  Service: Neurosurgery;  Laterality: N/A;  THORACIC ONE - THORACIC TWO LAMINECTOMY  . TONSILLECTOMY    . TOTAL KNEE ARTHROPLASTY Left 10/2003  . TOTAL KNEE ARTHROPLASTY Right 01/2004   [name]     OB History   None      Home Medications    Prior to Admission medications   Medication Sig Start Date End Date Taking? Authorizing Provider  allopurinol (ZYLOPRIM) 300 MG tablet Take 300 mg by mouth daily.   Yes [provider]  bisacodyl (DULCOLAX) 10 MG suppository Place 1 suppository (10 mg total) rectally daily as needed for moderate constipation. 02/19/18  Yes Regalado, Belkys A, MD  Cholecalciferol (VITAMIN D-3) 1000 units CAPS Take 2,000 Units by mouth daily.   Yes [provider]  cyclobenzaprine (FLEXERIL) 10 MG tablet Take 1 tablet (10 mg total) by mouth 3 (three) times daily as needed for muscle spasms. Patient taking differently: Take 10 mg by mouth 3 (three) times daily.  02/22/18  Yes Regalado,  Belkys A, MD  cycloSPORINE (RESTASIS) 0.05 % ophthalmic emulsion Place 1 drop into both eyes 2 (two) times daily.    Yes [provider]  docusate sodium (COLACE) 100 MG capsule Take 1 capsule (100 mg total) by mouth 2 (two) times daily. 02/19/18  Yes Regalado, Belkys A, MD  fluticasone (FLONASE) 50 MCG/ACT nasal spray SHAKE LIQUID AND USE 2 SPRAYS IN EACH NOSTRIL DAILY Patient taking differently: Place 2 sprays into both nostrils daily.  03/08/17  Yes Collene Gobble, MD  folic acid (FOLVITE) 1 MG tablet Take 1 mg by mouth once a day 09/03/17  Yes Aline August, MD  gabapentin (NEURONTIN) 800 MG tablet Take 800 mg by mouth 3 (three) times daily.   Yes [provider]  HYDROcodone-acetaminophen (NORCO/VICODIN) 5-325 MG tablet Take 2 tablets by mouth every 6 (six) hours as needed for moderate pain. Patient taking differently: Take 2 tablets by mouth 4 (  four) times daily.  02/19/18  Yes Regalado, Belkys A, MD  insulin aspart (NOVOLOG) 100 UNIT/ML injection Inject 2-10 Units into the skin See admin instructions. With meals. Per sliding scale. If CBG is 201-250= 2 units 251-300= 4 units 301-350= 6 units 351-400= 8 units 401-450= 10 units. If >450 call MD.   Yes [provider]  insulin glargine (LANTUS) 100 UNIT/ML injection Inject 30 Units into the skin at bedtime.   Yes [provider]  ipratropium (ATROVENT) 0.03 % nasal spray USE 2 SPRAYS IN EACH NOSTRIL THREE TIMES DAILY AS NEEDED FOR RHINITIS Patient taking differently: Place 2 sprays into both nostrils 3 (three) times daily as needed for rhinitis.  03/17/17  Yes Collene Gobble, MD  irbesartan (AVAPRO) 300 MG tablet Take 300 mg by mouth daily.   Yes [provider]  lidocaine (LIDODERM) 5 % Place 1 patch onto the skin daily. Remove & Discard patch within 12 hours or as directed by MD 02/19/18  Yes Regalado, Belkys A, MD  loratadine (CLARITIN) 10 MG tablet Take 10 mg by mouth daily.   Yes [provider]  meclizine (ANTIVERT) 25 MG tablet Take 1 tablet (25 mg total) by mouth 3 (three) times daily as needed for dizziness. 02/06/18  Yes Dorie Rank, MD  metFORMIN (GLUCOPHAGE) 500 MG tablet TAKE 1 TABLET(500 MG) BY MOUTH TWICE DAILY WITH A MEAL 02/28/18  Yes Renato Shin, MD  metoprolol tartrate (LOPRESSOR) 100 MG tablet Take 100 mg by mouth 2 (two) times daily.    Yes [provider]  montelukast (SINGULAIR) 10 MG tablet TAKE 1 TABLET(10 MG) BY MOUTH DAILY Patient taking differently: Take 10 mg by mouth at bedtime.  04/03/16  Yes Magdalen Spatz, NP  Multiple Vitamins-Minerals (MULTIVITAMIN WITH MINERALS) tablet Take 1 tablet by mouth daily.   Yes [provider]  nystatin cream (MYCOSTATIN) Apply 1 application topically 2 (two) times daily as needed for dry skin (skin irritation).   Yes [provider]  oxybutynin (DITROPAN-XL) 10 MG 24 hr tablet Take 1 tablet (10 mg total) by mouth daily. 02/18/15  Yes Biagio Borg, MD  oxyCODONE (OXY IR/ROXICODONE) 5 MG immediate release tablet Take 1 tablet (5 mg total) by mouth every 6 (six) hours as needed for breakthrough pain ((score 4 to 6)). 02/22/18  Yes Regalado, Belkys A, MD  pantoprazole (PROTONIX) 40 MG tablet TAKE 1 TABLET(40 MG) BY MOUTH TWICE DAILY Patient taking differently: Take 40 mg by mouth 2 (two) times daily.  03/02/17  Yes Collene Gobble, MD  polyethylene glycol (MIRALAX / GLYCOLAX) packet Take 17 g by mouth 2 (two) times daily. 02/22/18  Yes Regalado, Belkys A, MD  predniSONE (DELTASONE) 20 MG tablet TAKE 1 TABLET(20 MG) BY MOUTH DAILY WITH BREAKFAST 02/02/18  Yes Byrum, Rose Fillers, MD  ranitidine (ZANTAC) 300 MG tablet Take 1 tablet (300 mg total) by mouth 2 (two) times daily. 11/22/17  Yes Debbe Odea, MD  senna (SENOKOT) 8.6 MG TABS tablet Take 1 tablet (8.6 mg total) by mouth daily. 02/23/18  Yes Regalado, Belkys A, MD  sodium chloride (OCEAN) 0.65 % SOLN nasal spray Place 2 sprays into both nostrils 2 (two)  times daily.   Yes [provider]  Hydrocortisone (GERHARDT'S BUTT CREAM) CREA Apply 1 application topically 4 (four) times daily. Patient not taking: Reported on 03/16/2018 02/19/18   Regalado, Jerald Kief A, MD  insulin NPH Human (NOVOLIN N RELION) 100 UNIT/ML injection Inject 0.4 mLs (40 Units total) into  the skin 2 (two) times daily at 8 am and 10 pm. Patient not taking: Reported on 03/16/2018 02/19/18   Niel Hummer A, MD  Insulin Syringe-Needle U-100 (INSULIN SYRINGE 1CC/31GX5/16") 31G X 5/16" 1 ML MISC USE TO INJECT INSULIN TWICE DAILY 05/11/17   Renato Shin, MD  ondansetron (ZOFRAN ODT) 8 MG disintegrating tablet Take 1 tablet (8 mg total) by mouth every 8 (eight) hours as needed for nausea or vomiting. Patient not taking: Reported on 03/16/2018 02/06/18   Dorie Rank, MD  ONE [facility] LANCETS MISC Use to check sugars twice daily DX E11.22 08/23/17   Renato Shin, MD    Family History Family History  Problem Relation Age of Onset  . Esophageal cancer Brother   . Esophageal cancer Sister        ?  . Colon polyps Brother   . Pancreatic cancer Sister        ?  . Diabetes Sister   . Diabetes Unknown        Aunt and Uncle  . Heart disease Maternal Grandfather     Social History Social History   Tobacco Use  . Smoking status: Former Smoker    Packs/day: 1.00    Years: 20.00    Pack years: 20.00    Types: Cigarettes    Last attempt to quit: 06/22/1986    Years since quitting: 31.7  . Smokeless tobacco: Never Used  Substance Use Topics  . Alcohol use: Yes  . Drug use: No     Allergies   Ace inhibitors; Penicillins; and Adhesive [tape]   Review of Systems Review of Systems  Constitutional: Negative for chills and fever.  Respiratory: Negative for cough and shortness of breath.   Cardiovascular: Positive for leg swelling. Negative for chest pain and palpitations.  Gastrointestinal: Negative for abdominal pain, diarrhea, nausea and vomiting.  Genitourinary: Negative  for dysuria, flank pain and frequency.  Musculoskeletal: Positive for myalgias. Negative for arthralgias, back pain, neck pain and neck stiffness.  Skin: Negative for rash and wound.  Neurological: Negative for dizziness, weakness, light-headedness, numbness and headaches.  All other systems reviewed and are negative.    Physical Exam Updated Vital Signs BP 130/65   Pulse 88   Temp 98.8 F (37.1 C) (Oral)   Resp 13   SpO2 99%   Physical Exam  Constitutional: She is oriented to person, place, and time. She appears well-developed and well-nourished. No distress.  HENT:  Head: Normocephalic and atraumatic.  Mouth/Throat: Oropharynx is clear and moist. No oropharyngeal exudate.  Eyes: Pupils are equal, round, and reactive to light. EOM are normal.  Neck: Normal range of motion. Neck supple.  Cardiovascular: Normal rate and regular rhythm. Exam reveals no gallop and no friction rub.  No murmur heard. Pulmonary/Chest: Effort normal and breath sounds normal. No stridor. No respiratory distress. She has no wheezes. She has no rales. She exhibits no tenderness.  Abdominal: Soft. Bowel sounds are normal. There is no tenderness. There is no rebound and no guarding.  Musculoskeletal: Normal range of motion. She exhibits edema. She exhibits no tenderness.  Right lower extremity calf swelling compared to left.  Faint pulses palpable in bilateral feet.  Neurological: She is alert and oriented to person, place, and time.  Skin: Skin is warm and dry. Capillary refill takes less than 2 seconds. No rash noted. She is not diaphoretic. No erythema.  Psychiatric: She has a normal mood and affect. Her behavior is normal.  Nursing note and vitals reviewed.  ED Treatments / Results  Labs (all labs ordered are listed, but only abnormal results are displayed) Labs Reviewed  CBC WITH DIFFERENTIAL/PLATELET - Abnormal; Notable for the following components:      Result Value   RBC 3.66 (*)     Hemoglobin 9.8 (*)    HCT 33.1 (*)    MCHC 29.6 (*)    Neutro Abs 8.5 (*)    Abs Immature Granulocytes 0.2 (*)    All other components within normal limits  COMPREHENSIVE METABOLIC PANEL - Abnormal; Notable for the following components:   Potassium 5.6 (*)    Glucose, Bld 320 (*)    Creatinine, Ser 1.04 (*)    Albumin 2.4 (*)    AST 14 (*)    GFR calc non Af Amer 54 (*)    All other components within normal limits  POTASSIUM - Abnormal; Notable for the following components:   Potassium 5.2 (*)    All other components within normal limits  PROTIME-INR  APTT    EKG None  Radiology No results found.  Procedures Procedures (including critical care time)  Medications Ordered in ED Medications - No data to display   Initial Impression / Assessment and Plan / ED Course  I have reviewed the triage vital signs and the nursing notes.  Pertinent labs & imaging results that were available during my care of the patient were reviewed by me and considered in my medical decision making (see chart for details).     Doppler ultrasound without evidence of DVT.  Patient does have elevated potassium.  No EKG changes.  Repeat potassium is still mildly elevated at 5.2.  Have encouraged her follow-up with her primary physician for recheck.  Return precautions given.  Final Clinical Impressions(s) / ED Diagnoses   Final diagnoses:  Peripheral edema  Hyperglycemia  Hyperkalemia    ED Discharge Orders    None       Julianne Rice, MD 03/16/18 2127

## 2018-03-16 NOTE — Patient Outreach (Signed)
[facility] [address]) Care Management  03/16/2018  Christy May 08/15/1950 [id number]   RN Health Coach telephone call to patient.  [employer] compliance verified. This patient is in [place] place for rehab. Case closure at this time.    Plan:  Referral to social worker for [facility] needs Case Closure:  Consumer assessed and no further intervention needed patient is residing in [place] at this time.  [facility] Care Management [phone]

## 2018-03-16 NOTE — ED Notes (Signed)
Pt leaving via [facility] to [facility]

## 2018-03-17 ENCOUNTER — Other Ambulatory Visit: Payer: Self-pay | Admitting: Licensed Clinical Social Worker

## 2018-03-17 NOTE — Patient Outreach (Signed)
[facility]) Care Management  [facility] Social Work  03/17/2018  Christy May February 15, 1951 [id number]  Subjective:    Objective:   Encounter Medications:  Outpatient Encounter Medications as of 03/17/2018  Medication Sig Note  . allopurinol (ZYLOPRIM) 300 MG tablet Take 300 mg by mouth daily.   . bisacodyl (DULCOLAX) 10 MG suppository Place 1 suppository (10 mg total) rectally daily as needed for moderate constipation.   . Cholecalciferol (VITAMIN D-3) 1000 units CAPS Take 2,000 Units by mouth daily.   . cyclobenzaprine (FLEXERIL) 10 MG tablet Take 1 tablet (10 mg total) by mouth 3 (three) times daily as needed for muscle spasms. (Patient taking differently: Take 10 mg by mouth 3 (three) times daily. )   . cycloSPORINE (RESTASIS) 0.05 % ophthalmic emulsion Place 1 drop into both eyes 2 (two) times daily.    Marland Kitchen docusate sodium (COLACE) 100 MG capsule Take 1 capsule (100 mg total) by mouth 2 (two) times daily.   . fluticasone (FLONASE) 50 MCG/ACT nasal spray SHAKE LIQUID AND USE 2 SPRAYS IN EACH NOSTRIL DAILY (Patient taking differently: Place 2 sprays into both nostrils daily. )   . folic acid (FOLVITE) 1 MG tablet Take 1 mg by mouth once a day   . gabapentin (NEURONTIN) 800 MG tablet Take 800 mg by mouth 3 (three) times daily.   Marland Kitchen HYDROcodone-acetaminophen (NORCO/VICODIN) 5-325 MG tablet Take 2 tablets by mouth every 6 (six) hours as needed for moderate pain. (Patient taking differently: Take 2 tablets by mouth 4 (four) times daily. )   . Hydrocortisone (GERHARDT'S BUTT CREAM) CREA Apply 1 application topically 4 (four) times daily. (Patient not taking: Reported on 03/16/2018)   . insulin aspart (NOVOLOG) 100 UNIT/ML injection Inject 2-10 Units into the skin See admin instructions. With meals. Per sliding scale. If CBG is 201-250= 2 units 251-300= 4 units 301-350= 6 units 351-400= 8 units 401-450= 10 units. If >450 call MD. 03/16/2018: Has not started yet  . insulin  glargine (LANTUS) 100 UNIT/ML injection Inject 30 Units into the skin at bedtime. 03/16/2018: Has not started yet  . insulin NPH Human (NOVOLIN N RELION) 100 UNIT/ML injection Inject 0.4 mLs (40 Units total) into the skin 2 (two) times daily at 8 am and 10 pm. (Patient not taking: Reported on 03/16/2018)   . Insulin Syringe-Needle U-100 (INSULIN SYRINGE 1CC/31GX5/16") 31G X 5/16" 1 ML MISC USE TO INJECT INSULIN TWICE DAILY   . ipratropium (ATROVENT) 0.03 % nasal spray USE 2 SPRAYS IN EACH NOSTRIL THREE TIMES DAILY AS NEEDED FOR RHINITIS (Patient taking differently: Place 2 sprays into both nostrils 3 (three) times daily as needed for rhinitis. )   . irbesartan (AVAPRO) 300 MG tablet Take 300 mg by mouth daily.   Marland Kitchen lidocaine (LIDODERM) 5 % Place 1 patch onto the skin daily. Remove & Discard patch within 12 hours or as directed by MD   . loratadine (CLARITIN) 10 MG tablet Take 10 mg by mouth daily.   . meclizine (ANTIVERT) 25 MG tablet Take 1 tablet (25 mg total) by mouth 3 (three) times daily as needed for dizziness.   . metFORMIN (GLUCOPHAGE) 500 MG tablet TAKE 1 TABLET(500 MG) BY MOUTH TWICE DAILY WITH A MEAL   . metoprolol tartrate (LOPRESSOR) 100 MG tablet Take 100 mg by mouth 2 (two) times daily.    . montelukast (SINGULAIR) 10 MG tablet TAKE 1 TABLET(10 MG) BY MOUTH DAILY (Patient taking differently: Take 10 mg by mouth at bedtime. )   .  Multiple Vitamins-Minerals (MULTIVITAMIN WITH MINERALS) tablet Take 1 tablet by mouth daily.   Marland Kitchen nystatin cream (MYCOSTATIN) Apply 1 application topically 2 (two) times daily as needed for dry skin (skin irritation).   . ondansetron (ZOFRAN ODT) 8 MG disintegrating tablet Take 1 tablet (8 mg total) by mouth every 8 (eight) hours as needed for nausea or vomiting. (Patient not taking: Reported on 03/16/2018)   . ONE TOUCH LANCETS MISC Use to check sugars twice daily DX E11.22   . oxybutynin (DITROPAN-XL) 10 MG 24 hr tablet Take 1 tablet (10 mg total) by mouth daily.    Marland Kitchen oxyCODONE (OXY IR/ROXICODONE) 5 MG immediate release tablet Take 1 tablet (5 mg total) by mouth every 6 (six) hours as needed for breakthrough pain ((score 4 to 6)).   Marland Kitchen pantoprazole (PROTONIX) 40 MG tablet TAKE 1 TABLET(40 MG) BY MOUTH TWICE DAILY (Patient taking differently: Take 40 mg by mouth 2 (two) times daily. )   . polyethylene glycol (MIRALAX / GLYCOLAX) packet Take 17 g by mouth 2 (two) times daily.   . predniSONE (DELTASONE) 20 MG tablet TAKE 1 TABLET(20 MG) BY MOUTH DAILY WITH BREAKFAST   . ranitidine (ZANTAC) 300 MG tablet Take 1 tablet (300 mg total) by mouth 2 (two) times daily.   Marland Kitchen senna (SENOKOT) 8.6 MG TABS tablet Take 1 tablet (8.6 mg total) by mouth daily.   . sodium chloride (OCEAN) 0.65 % SOLN nasal spray Place 2 sprays into both nostrils 2 (two) times daily.    No facility-administered encounter medications on file as of 03/17/2018.     Functional Status:  In your present state of health, do you have any difficulty performing the following activities: 02/11/2018 02/11/2018  Hearing? - N  Vision? - N  Difficulty concentrating or making decisions? - N  Walking or climbing stairs? - Y  Dressing or bathing? - Y  Doing errands, shopping? Y -  Conservation officer, nature and eating ? - -  Using the Toilet? - -  In the past six months, have you accidently leaked urine? - -  Do you have problems with loss of bowel control? - -  Managing your Medications? - -  Managing your Finances? - -  Housekeeping or managing your Housekeeping? - -  Some recent data might be hidden    Fall/Depression Screening:  PHQ 2/9 Scores 01/25/2018 08/23/2017 06/24/2017 03/02/2017 11/24/2016 11/11/2016 01/25/2015  PHQ - 2 Score 1 2 0 0 0 0 0  PHQ- 9 Score - 7 - - - - -    Assessment:   CSW traveled to [place] in [place], [place] on 03/17/18 to visit client. CSW met with client on 03/17/18 at client's room at nursing center. Client is receiving nursing care and physical therapy  support at nursing center.  She said she returned recently from an Emergency room visit at [facility]. She said she had been living at home previously alone.She had been receiving help from her daughter and from her sister.  However, she said her daughter lived in [place], [place] and she said her sister lived in [place], [place]. Thus, client said when she discharges home from nursing center she hopes to receive help in the home from a home health agency.   She said she is eating adequately and sleeping adequately. She is hoping to receive care at nursing center for a few weeks and then hopes to return home with needed supports in place.  She did not talk of  any pain issues. Client was resting in bed during CSW visit. CSW gave client [facility] CSW card and CSW encouraged [facility] to call CSW at [phone] as needed to discuss social work needs of client. CSW thanked Ren for allowing CSW to visit her at nursing facility on 03/17/18.        Plan:  CSW to call client in 3 weeks to assess client status and needs at that time.  [name].Cleaven Demario MSW, LCSW Licensed Clinical Social Worker [facility] Care Management [phone]

## 2018-03-22 DIAGNOSIS — Z7952 Long term (current) use of systemic steroids: Secondary | ICD-10-CM | POA: Diagnosis not present

## 2018-03-22 DIAGNOSIS — E669 Obesity, unspecified: Secondary | ICD-10-CM | POA: Diagnosis not present

## 2018-03-22 DIAGNOSIS — E1140 Type 2 diabetes mellitus with diabetic neuropathy, unspecified: Secondary | ICD-10-CM | POA: Diagnosis not present

## 2018-03-24 DIAGNOSIS — R5383 Other fatigue: Secondary | ICD-10-CM | POA: Diagnosis not present

## 2018-03-24 DIAGNOSIS — R52 Pain, unspecified: Secondary | ICD-10-CM | POA: Diagnosis not present

## 2018-03-24 DIAGNOSIS — M797 Fibromyalgia: Secondary | ICD-10-CM | POA: Diagnosis not present

## 2018-03-25 ENCOUNTER — Inpatient Hospital Stay (HOSPITAL_COMMUNITY)
Admission: EM | Admit: 2018-03-25 | Discharge: 2018-03-28 | DRG: 871 | Disposition: A | Discharge status: Skilled Nursing Facility | Payer: Medicare Other | Attending: Internal Medicine | Admitting: Internal Medicine

## 2018-03-25 ENCOUNTER — Encounter (HOSPITAL_COMMUNITY): Payer: Self-pay | Admitting: Emergency Medicine

## 2018-03-25 ENCOUNTER — Other Ambulatory Visit: Payer: Self-pay

## 2018-03-25 ENCOUNTER — Emergency Department (HOSPITAL_COMMUNITY): Payer: Medicare Other

## 2018-03-25 DIAGNOSIS — R5381 Other malaise: Secondary | ICD-10-CM | POA: Diagnosis not present

## 2018-03-25 DIAGNOSIS — G4730 Sleep apnea, unspecified: Secondary | ICD-10-CM | POA: Diagnosis present

## 2018-03-25 DIAGNOSIS — Z981 Arthrodesis status: Secondary | ICD-10-CM

## 2018-03-25 DIAGNOSIS — Z7951 Long term (current) use of inhaled steroids: Secondary | ICD-10-CM | POA: Diagnosis not present

## 2018-03-25 DIAGNOSIS — E1169 Type 2 diabetes mellitus with other specified complication: Secondary | ICD-10-CM | POA: Diagnosis not present

## 2018-03-25 DIAGNOSIS — Z96653 Presence of artificial knee joint, bilateral: Secondary | ICD-10-CM | POA: Diagnosis present

## 2018-03-25 DIAGNOSIS — I959 Hypotension, unspecified: Secondary | ICD-10-CM | POA: Diagnosis not present

## 2018-03-25 DIAGNOSIS — Z794 Long term (current) use of insulin: Secondary | ICD-10-CM

## 2018-03-25 DIAGNOSIS — R011 Cardiac murmur, unspecified: Secondary | ICD-10-CM | POA: Diagnosis present

## 2018-03-25 DIAGNOSIS — Z7952 Long term (current) use of systemic steroids: Secondary | ICD-10-CM | POA: Diagnosis not present

## 2018-03-25 DIAGNOSIS — Y95 Nosocomial condition: Secondary | ICD-10-CM | POA: Diagnosis present

## 2018-03-25 DIAGNOSIS — G8929 Other chronic pain: Secondary | ICD-10-CM | POA: Diagnosis present

## 2018-03-25 DIAGNOSIS — Z743 Need for continuous supervision: Secondary | ICD-10-CM | POA: Diagnosis not present

## 2018-03-25 DIAGNOSIS — Z800 Family history of malignant neoplasm of digestive organs: Secondary | ICD-10-CM | POA: Diagnosis not present

## 2018-03-25 DIAGNOSIS — J189 Pneumonia, unspecified organism: Secondary | ICD-10-CM | POA: Diagnosis present

## 2018-03-25 DIAGNOSIS — R52 Pain, unspecified: Secondary | ICD-10-CM

## 2018-03-25 DIAGNOSIS — Z880 Allergy status to penicillin: Secondary | ICD-10-CM

## 2018-03-25 DIAGNOSIS — R1310 Dysphagia, unspecified: Secondary | ICD-10-CM | POA: Diagnosis present

## 2018-03-25 DIAGNOSIS — Z8719 Personal history of other diseases of the digestive system: Secondary | ICD-10-CM

## 2018-03-25 DIAGNOSIS — G9341 Metabolic encephalopathy: Secondary | ICD-10-CM | POA: Diagnosis present

## 2018-03-25 DIAGNOSIS — A419 Sepsis, unspecified organism: Principal | ICD-10-CM | POA: Diagnosis present

## 2018-03-25 DIAGNOSIS — E1165 Type 2 diabetes mellitus with hyperglycemia: Secondary | ICD-10-CM | POA: Diagnosis not present

## 2018-03-25 DIAGNOSIS — E669 Obesity, unspecified: Secondary | ICD-10-CM | POA: Diagnosis not present

## 2018-03-25 DIAGNOSIS — I10 Essential (primary) hypertension: Secondary | ICD-10-CM | POA: Diagnosis present

## 2018-03-25 DIAGNOSIS — M109 Gout, unspecified: Secondary | ICD-10-CM | POA: Diagnosis present

## 2018-03-25 DIAGNOSIS — Z8371 Family history of colonic polyps: Secondary | ICD-10-CM

## 2018-03-25 DIAGNOSIS — K219 Gastro-esophageal reflux disease without esophagitis: Secondary | ICD-10-CM | POA: Diagnosis present

## 2018-03-25 DIAGNOSIS — M549 Dorsalgia, unspecified: Secondary | ICD-10-CM | POA: Diagnosis present

## 2018-03-25 DIAGNOSIS — E1140 Type 2 diabetes mellitus with diabetic neuropathy, unspecified: Secondary | ICD-10-CM | POA: Diagnosis present

## 2018-03-25 DIAGNOSIS — B369 Superficial mycosis, unspecified: Secondary | ICD-10-CM | POA: Diagnosis not present

## 2018-03-25 DIAGNOSIS — E1142 Type 2 diabetes mellitus with diabetic polyneuropathy: Secondary | ICD-10-CM | POA: Diagnosis present

## 2018-03-25 DIAGNOSIS — Z833 Family history of diabetes mellitus: Secondary | ICD-10-CM | POA: Diagnosis not present

## 2018-03-25 DIAGNOSIS — Z91048 Other nonmedicinal substance allergy status: Secondary | ICD-10-CM

## 2018-03-25 DIAGNOSIS — L8990 Pressure ulcer of unspecified site, unspecified stage: Secondary | ICD-10-CM | POA: Diagnosis present

## 2018-03-25 DIAGNOSIS — G4733 Obstructive sleep apnea (adult) (pediatric): Secondary | ICD-10-CM | POA: Diagnosis not present

## 2018-03-25 DIAGNOSIS — E559 Vitamin D deficiency, unspecified: Secondary | ICD-10-CM | POA: Diagnosis present

## 2018-03-25 DIAGNOSIS — Z888 Allergy status to other drugs, medicaments and biological substances status: Secondary | ICD-10-CM

## 2018-03-25 DIAGNOSIS — R5383 Other fatigue: Secondary | ICD-10-CM | POA: Diagnosis not present

## 2018-03-25 DIAGNOSIS — K449 Diaphragmatic hernia without obstruction or gangrene: Secondary | ICD-10-CM | POA: Diagnosis present

## 2018-03-25 DIAGNOSIS — Z90710 Acquired absence of both cervix and uterus: Secondary | ICD-10-CM | POA: Diagnosis not present

## 2018-03-25 DIAGNOSIS — R000 Tachycardia, unspecified: Secondary | ICD-10-CM | POA: Diagnosis not present

## 2018-03-25 DIAGNOSIS — R109 Unspecified abdominal pain: Secondary | ICD-10-CM | POA: Diagnosis not present

## 2018-03-25 DIAGNOSIS — Z87891 Personal history of nicotine dependence: Secondary | ICD-10-CM

## 2018-03-25 DIAGNOSIS — R509 Fever, unspecified: Secondary | ICD-10-CM | POA: Diagnosis not present

## 2018-03-25 DIAGNOSIS — L89319 Pressure ulcer of right buttock, unspecified stage: Secondary | ICD-10-CM | POA: Diagnosis present

## 2018-03-25 DIAGNOSIS — Z8249 Family history of ischemic heart disease and other diseases of the circulatory system: Secondary | ICD-10-CM

## 2018-03-25 DIAGNOSIS — R279 Unspecified lack of coordination: Secondary | ICD-10-CM | POA: Diagnosis not present

## 2018-03-25 DIAGNOSIS — Z6841 Body Mass Index (BMI) 40.0 and over, adult: Secondary | ICD-10-CM | POA: Diagnosis not present

## 2018-03-25 DIAGNOSIS — R531 Weakness: Secondary | ICD-10-CM | POA: Diagnosis not present

## 2018-03-25 DIAGNOSIS — Z9049 Acquired absence of other specified parts of digestive tract: Secondary | ICD-10-CM

## 2018-03-25 HISTORY — DX: Pneumonia, unspecified organism: J18.9

## 2018-03-25 LAB — URINALYSIS, ROUTINE W REFLEX MICROSCOPIC
BILIRUBIN URINE: NEGATIVE
GLUCOSE, UA: 50 mg/dL — AB
HGB URINE DIPSTICK: NEGATIVE
KETONES UR: NEGATIVE mg/dL
Leukocytes, UA: NEGATIVE
NITRITE: NEGATIVE
Protein, ur: NEGATIVE mg/dL
SPECIFIC GRAVITY, URINE: 1.017 (ref 1.005–1.030)
pH: 5 (ref 5.0–8.0)

## 2018-03-25 LAB — COMPREHENSIVE METABOLIC PANEL
ALK PHOS: 78 U/L (ref 38–126)
ALT: 22 U/L (ref 0–44)
AST: 17 U/L (ref 15–41)
Albumin: 2.4 g/dL — ABNORMAL LOW (ref 3.5–5.0)
Anion gap: 14 (ref 5–15)
BUN: 21 mg/dL (ref 8–23)
CALCIUM: 9.7 mg/dL (ref 8.9–10.3)
CO2: 25 mmol/L (ref 22–32)
CREATININE: 1.26 mg/dL — AB (ref 0.44–1.00)
Chloride: 97 mmol/L — ABNORMAL LOW (ref 98–111)
GFR, EST AFRICAN AMERICAN: 50 mL/min — AB (ref >60)
GFR, EST NON AFRICAN AMERICAN: 43 mL/min — AB (ref >60)
Glucose, Bld: 235 mg/dL — ABNORMAL HIGH (ref 70–99)
Potassium: 4.5 mmol/L (ref 3.5–5.1)
Sodium: 136 mmol/L (ref 135–145)
Total Bilirubin: 0.4 mg/dL (ref 0.3–1.2)
Total Protein: 7.1 g/dL (ref 6.5–8.1)

## 2018-03-25 LAB — CBC WITH DIFFERENTIAL/PLATELET
Abs Immature Granulocytes: 0.1 10*3/uL (ref 0.0–0.1)
Basophils Absolute: 0 10*3/uL (ref 0.0–0.1)
Basophils Relative: 0 %
EOS PCT: 1 %
Eosinophils Absolute: 0.1 10*3/uL (ref 0.0–0.7)
HCT: 33.5 % — ABNORMAL LOW (ref 36.0–46.0)
HEMOGLOBIN: 9.9 g/dL — AB (ref 12.0–15.0)
Immature Granulocytes: 1 %
LYMPHS PCT: 22 %
Lymphs Abs: 2.1 10*3/uL (ref 0.7–4.0)
MCH: 26.1 pg (ref 26.0–34.0)
MCHC: 29.6 g/dL — AB (ref 30.0–36.0)
MCV: 88.2 fL (ref 78.0–100.0)
MONO ABS: 0.7 10*3/uL (ref 0.1–1.0)
MONOS PCT: 8 %
Neutro Abs: 6.4 10*3/uL (ref 1.7–7.7)
Neutrophils Relative %: 68 %
Platelets: 335 10*3/uL (ref 150–400)
RBC: 3.8 MIL/uL — ABNORMAL LOW (ref 3.87–5.11)
RDW: 16 % — ABNORMAL HIGH (ref 11.5–15.5)
WBC: 9.4 10*3/uL (ref 4.0–10.5)

## 2018-03-25 LAB — LACTIC ACID, PLASMA
Lactic Acid, Venous: 1.1 mmol/L (ref 0.5–1.9)
Lactic Acid, Venous: 1.2 mmol/L (ref 0.5–1.9)

## 2018-03-25 LAB — PROTIME-INR
INR: 2.35
PROTHROMBIN TIME: 25.5 s — AB (ref 11.4–15.2)

## 2018-03-25 LAB — GLUCOSE, CAPILLARY
GLUCOSE-CAPILLARY: 125 mg/dL — AB (ref 70–99)
GLUCOSE-CAPILLARY: 259 mg/dL — AB (ref 70–99)

## 2018-03-25 LAB — I-STAT CG4 LACTIC ACID, ED: LACTIC ACID, VENOUS: 2.42 mmol/L — AB (ref 0.5–1.9)

## 2018-03-25 LAB — MAGNESIUM: MAGNESIUM: 1.7 mg/dL (ref 1.7–2.4)

## 2018-03-25 MED ORDER — CLOTRIMAZOLE 1 % EX CREA
TOPICAL_CREAM | Freq: Two times a day (BID) | CUTANEOUS | Status: DC
  Administered 2018-03-25 – 2018-03-27 (×5): via TOPICAL
  Filled 2018-03-25: qty 15

## 2018-03-25 MED ORDER — SODIUM CHLORIDE 0.9 % IV BOLUS
1000.0000 mL | Freq: Once | INTRAVENOUS | Status: AC
  Administered 2018-03-25: 1000 mL via INTRAVENOUS

## 2018-03-25 MED ORDER — VANCOMYCIN HCL 10 G IV SOLR
1250.0000 mg | Freq: Two times a day (BID) | INTRAVENOUS | Status: DC
  Administered 2018-03-26 – 2018-03-28 (×6): 1250 mg via INTRAVENOUS
  Filled 2018-03-25 (×7): qty 1250

## 2018-03-25 MED ORDER — IPRATROPIUM BROMIDE 0.02 % IN SOLN
0.5000 mg | Freq: Four times a day (QID) | RESPIRATORY_TRACT | Status: DC
  Administered 2018-03-25 – 2018-03-28 (×12): 0.5 mg via RESPIRATORY_TRACT
  Filled 2018-03-25 (×12): qty 2.5

## 2018-03-25 MED ORDER — CYCLOSPORINE 0.05 % OP EMUL
1.0000 [drp] | Freq: Two times a day (BID) | OPHTHALMIC | Status: DC
  Administered 2018-03-25 – 2018-03-28 (×6): 1 [drp] via OPHTHALMIC
  Filled 2018-03-25 (×7): qty 1

## 2018-03-25 MED ORDER — POLYETHYLENE GLYCOL 3350 17 G PO PACK: 17.0000 g | PACK | Freq: Two times a day (BID) | ORAL | Status: DC | PRN

## 2018-03-25 MED ORDER — LEVALBUTEROL HCL 0.63 MG/3ML IN NEBU
0.6300 mg | INHALATION_SOLUTION | Freq: Four times a day (QID) | RESPIRATORY_TRACT | Status: DC
  Administered 2018-03-25 – 2018-03-28 (×12): 0.63 mg via RESPIRATORY_TRACT
  Filled 2018-03-25 (×12): qty 3

## 2018-03-25 MED ORDER — CYCLOBENZAPRINE HCL 10 MG PO TABS
10.0000 mg | ORAL_TABLET | Freq: Three times a day (TID) | ORAL | Status: DC | PRN
  Administered 2018-03-27 – 2018-03-28 (×3): 10 mg via ORAL
  Filled 2018-03-25 (×4): qty 1

## 2018-03-25 MED ORDER — FOLIC ACID 1 MG PO TABS
1.0000 mg | ORAL_TABLET | Freq: Every day | ORAL | Status: DC
  Administered 2018-03-26 – 2018-03-28 (×3): 1 mg via ORAL
  Filled 2018-03-25 (×3): qty 1

## 2018-03-25 MED ORDER — ENOXAPARIN SODIUM 40 MG/0.4ML ~~LOC~~ SOLN
40.0000 mg | SUBCUTANEOUS | Status: DC
  Administered 2018-03-25: 40 mg via SUBCUTANEOUS
  Filled 2018-03-25: qty 0.4

## 2018-03-25 MED ORDER — PANTOPRAZOLE SODIUM 40 MG PO TBEC
40.0000 mg | DELAYED_RELEASE_TABLET | Freq: Two times a day (BID) | ORAL | Status: DC
  Administered 2018-03-25 – 2018-03-28 (×6): 40 mg via ORAL
  Filled 2018-03-25 (×6): qty 1

## 2018-03-25 MED ORDER — VANCOMYCIN HCL 10 G IV SOLR
2500.0000 mg | Freq: Once | INTRAVENOUS | Status: AC
  Administered 2018-03-25: 2500 mg via INTRAVENOUS
  Filled 2018-03-25: qty 2500

## 2018-03-25 MED ORDER — ALLOPURINOL 300 MG PO TABS
300.0000 mg | ORAL_TABLET | Freq: Every day | ORAL | Status: DC
  Administered 2018-03-26 – 2018-03-28 (×3): 300 mg via ORAL
  Filled 2018-03-25 (×3): qty 1

## 2018-03-25 MED ORDER — FLUTICASONE PROPIONATE 50 MCG/ACT NA SUSP
2.0000 | Freq: Every day | NASAL | Status: DC
  Administered 2018-03-26 – 2018-03-28 (×3): 2 via NASAL
  Filled 2018-03-25: qty 16

## 2018-03-25 MED ORDER — MECLIZINE HCL 25 MG PO TABS: 25.0000 mg | ORAL_TABLET | Freq: Three times a day (TID) | ORAL | Status: DC | PRN

## 2018-03-25 MED ORDER — GABAPENTIN 800 MG PO TABS
800.0000 mg | ORAL_TABLET | Freq: Three times a day (TID) | ORAL | Status: DC
  Filled 2018-03-25: qty 1

## 2018-03-25 MED ORDER — LEVALBUTEROL HCL 0.63 MG/3ML IN NEBU: 0.6300 mg | INHALATION_SOLUTION | Freq: Four times a day (QID) | RESPIRATORY_TRACT | Status: DC | PRN

## 2018-03-25 MED ORDER — ACETAMINOPHEN 650 MG RE SUPP: 650.0000 mg | Freq: Four times a day (QID) | RECTAL | Status: DC | PRN

## 2018-03-25 MED ORDER — DOCUSATE SODIUM 100 MG PO CAPS
100.0000 mg | ORAL_CAPSULE | Freq: Two times a day (BID) | ORAL | Status: DC
  Administered 2018-03-25 – 2018-03-27 (×3): 100 mg via ORAL
  Filled 2018-03-25 (×4): qty 1

## 2018-03-25 MED ORDER — FLUCONAZOLE 150 MG PO TABS
150.0000 mg | ORAL_TABLET | Freq: Every day | ORAL | Status: DC
  Administered 2018-03-25 – 2018-03-28 (×4): 150 mg via ORAL
  Filled 2018-03-25 (×4): qty 1

## 2018-03-25 MED ORDER — LORATADINE 10 MG PO TABS
10.0000 mg | ORAL_TABLET | Freq: Every day | ORAL | Status: DC
  Administered 2018-03-26 – 2018-03-28 (×3): 10 mg via ORAL
  Filled 2018-03-25 (×3): qty 1

## 2018-03-25 MED ORDER — BISACODYL 10 MG RE SUPP: 10.0000 mg | Freq: Every day | RECTAL | Status: DC | PRN

## 2018-03-25 MED ORDER — SODIUM CHLORIDE 0.9 % IV SOLN
2.0000 g | Freq: Once | INTRAVENOUS | Status: AC
  Administered 2018-03-25: 2 g via INTRAVENOUS
  Filled 2018-03-25: qty 2

## 2018-03-25 MED ORDER — ACETAMINOPHEN 325 MG PO TABS
650.0000 mg | ORAL_TABLET | Freq: Four times a day (QID) | ORAL | Status: DC | PRN
  Administered 2018-03-25 – 2018-03-28 (×6): 650 mg via ORAL
  Filled 2018-03-25 (×6): qty 2

## 2018-03-25 MED ORDER — SENNA 8.6 MG PO TABS
1.0000 | ORAL_TABLET | Freq: Every day | ORAL | Status: DC
  Filled 2018-03-25: qty 1

## 2018-03-25 MED ORDER — INSULIN ASPART 100 UNIT/ML ~~LOC~~ SOLN
0.0000 [IU] | Freq: Three times a day (TID) | SUBCUTANEOUS | Status: DC
  Administered 2018-03-25: 3 [IU] via SUBCUTANEOUS
  Administered 2018-03-26 (×2): 4 [IU] via SUBCUTANEOUS
  Administered 2018-03-26 – 2018-03-27 (×2): 3 [IU] via SUBCUTANEOUS
  Administered 2018-03-28: 7 [IU] via SUBCUTANEOUS
  Administered 2018-03-28 (×2): 3 [IU] via SUBCUTANEOUS

## 2018-03-25 MED ORDER — ONDANSETRON HCL 4 MG/2ML IJ SOLN: 4.0000 mg | Freq: Four times a day (QID) | INTRAMUSCULAR | Status: DC | PRN

## 2018-03-25 MED ORDER — METFORMIN HCL 500 MG PO TABS
1000.0000 mg | ORAL_TABLET | Freq: Two times a day (BID) | ORAL | Status: DC
  Administered 2018-03-26 – 2018-03-28 (×6): 1000 mg via ORAL
  Filled 2018-03-25 (×6): qty 2

## 2018-03-25 MED ORDER — VITAMIN D 1000 UNITS PO TABS
2000.0000 [IU] | ORAL_TABLET | Freq: Every day | ORAL | Status: DC
  Administered 2018-03-26 – 2018-03-28 (×3): 2000 [IU] via ORAL
  Filled 2018-03-25 (×3): qty 2

## 2018-03-25 MED ORDER — GABAPENTIN 400 MG PO CAPS
800.0000 mg | ORAL_CAPSULE | Freq: Three times a day (TID) | ORAL | Status: DC
  Administered 2018-03-26 – 2018-03-28 (×9): 800 mg via ORAL
  Filled 2018-03-25 (×9): qty 2

## 2018-03-25 MED ORDER — ACETAMINOPHEN 325 MG PO TABS
650.0000 mg | ORAL_TABLET | Freq: Once | ORAL | Status: AC
  Administered 2018-03-25: 650 mg via ORAL
  Filled 2018-03-25: qty 2

## 2018-03-25 MED ORDER — MAGNESIUM SULFATE 2 GM/50ML IV SOLN
2.0000 g | Freq: Once | INTRAVENOUS | Status: AC
  Administered 2018-03-25: 2 g via INTRAVENOUS
  Filled 2018-03-25: qty 50

## 2018-03-25 MED ORDER — SODIUM CHLORIDE 0.9 % IV SOLN
2.0000 g | Freq: Three times a day (TID) | INTRAVENOUS | Status: DC
  Administered 2018-03-25 – 2018-03-26 (×2): 2 g via INTRAVENOUS
  Filled 2018-03-25 (×3): qty 2

## 2018-03-25 MED ORDER — NYSTATIN 100000 UNIT/GM EX POWD
Freq: Three times a day (TID) | CUTANEOUS | Status: DC
  Administered 2018-03-25 – 2018-03-27 (×7): via TOPICAL
  Filled 2018-03-25: qty 15

## 2018-03-25 MED ORDER — ONDANSETRON HCL 4 MG PO TABS: 4.0000 mg | ORAL_TABLET | Freq: Four times a day (QID) | ORAL | Status: DC | PRN

## 2018-03-25 NOTE — Progress Notes (Signed)
Pharmacy Antibiotic Note  Christy May is a 67 y.o. female admitted on 03/25/2018 with sepsis.  Pharmacy has been consulted for vancomycin/aztreonam dosing. WBC - 9.4, Tmax - 101.5 (w/ EMS), lactic acid - 2.42  Plan: -Vancomycin 2500mg  x1, then 1250mg  q12h -Aztreonam 2g x1, then 2g q8h   Height: 5\' 4"  (162.6 cm) Weight: (!) 330 lb (149.7 kg) IBW/kg (Calculated) : 54.7  Temp (24hrs), Avg:99.9 F (37.7 C), Min:99.7 F (37.6 C), Max:100 F (37.8 C)  Recent Labs  Lab 03/25/18 1057 03/25/18 1107  WBC 9.4  --   CREATININE 1.26*  --   LATICACIDVEN  --  2.42*    Estimated Creatinine Clearance: 62.5 mL/min (A) (by C-G formula based on SCr of 1.26 mg/dL (H)).    Allergies  Allergen Reactions  . Ace Inhibitors Anaphylaxis and Cough    Tolerates Irbesartan (home med)  . Penicillins Hives and Other (See Comments)    Tolerated ceftriaxone in 2019  Has patient had a PCN reaction causing immediate rash, facial/tongue/throat swelling, SOB or lightheadedness with hypotension: Yes Has patient had a PCN reaction causing severe rash involving mucus membranes or skin necrosis:  No Has patient had a PCN reaction that required hospitalization: No Has patient had a PCN reaction occurring within the last 10 years: No If all of the above answers are "NO", then may proceed with Cephalosporin use.  . Adhesive [Tape] Hives and Rash    Antimicrobials this admission: vancomycin 10/4 >>  Aztreonam 10/4 >>   Dose adjustments this admission: N/A  Microbiology results: pending  Thank you for allowing pharmacy to be a part of this patient's care.  Tyson Babinski 03/25/2018 3:31 PM

## 2018-03-25 NOTE — H&P (Signed)
History and Physical    Christy May HYW:[id number] DOB: January 29, 1951 DOA: 03/25/2018  PCP: Lucianne Lei, MD  Patient coming from: [facility] and [place].  I have personally briefly reviewed patient's old medical records in [place]  Chief Complaint: Fever.  HPI: Christy May is a 67 y.o. female with medical history significant of allergy, anemia, anxiety, depression, osteoarthritis, chronic back pain, colon polyps, dehydration, diabetic peripheral neuropathy, dysphagia, esophageal reflux, heart murmur, history of hiatal hernia, migraine headaches, OSA on CPAP, history of septic shock, type 2 diabetes, hypertension, vitamin D deficiency who is sent from her nursing facility due to fever associated with generalized weakness, mild dyspnea and nausea for the past 4 days.  She denies headache, sore throat, hemoptysis.  No chest pain, palpitations, dizziness, diaphoresis, PND or orthopnea.  She occasionally has lower extremity edema  ED Course: Initial vital signs temperature 99.7 F, pulse 125, respirations 16, blood pressure 101/70 mmHg and O2 sat 97% on room air.  Her urinalysis showed glucosuria 50 mg/dL, but was otherwise normal.  White counts 9.4, hemoglobin 9.9 g/dL and platelets 335.  PT 25.5 and INR 2.35.  Lactic acid was 2.42 initially, then next 2 measurements normalized.  CMP shows a chloride of 97 mmol/L.  Glucose of 235, creatinine of 1.26 mg/dL and an albumin of 2.4 g/dL.  All other values are within normal limits.  Her chest radiograph shows bibasilar atelectasis/infiltrate.  Please see images and full radiology report for further detail.  Review of Systems: As per HPI otherwise 10 point review of systems negative.   Past Medical History:  Diagnosis Date  . Allergy   . Anemia, unspecified   . Anxiety   . Arthritis    "neck, back, hands" (09/03/2014)  . Chronic airway obstruction, not elsewhere classified    "I was told I don't have this/tests done @ [facility]  05/2014"  . Chronic back pain   . Colon polyps   . Complication of anesthesia    "I've had recall; I probably stopped breathing at least 2 times" (09/03/2014)  . Dehydration   . Depressive disorder, not elsewhere classified   . Diabetic peripheral neuropathy ([place])   . Dysphagia 2007   historyof dysphagia with severe dysmotility by barium swallow-[name]  . Esophageal reflux   . Family history of adverse reaction to anesthesia    "my brother was told they lost him a couple times during OR"  . Heart murmur   . History of hiatal hernia   . Migraine    "couple times/month right now" (09/03/2014)  . Obesity, unspecified   . OSA (obstructive sleep apnea)     CPAP  . Septic shock ([place]) 08/30/2017  . Spondylosis of unspecified site without mention of myelopathy   . Type II diabetes mellitus ([place])   . Unspecified essential hypertension   . Unspecified menopausal and postmenopausal disorder   . Unspecified vitamin D deficiency   . Upper airway cough syndrome     Past Surgical History:  Procedure Laterality Date  . ABDOMINAL HYSTERECTOMY     "partial"  . ANTERIOR CERVICAL DECOMP/DISCECTOMY FUSION     multiple cervical spine levels;Dr. Arnoldo Morale  . APPENDECTOMY    . BACK SURGERY    . CHOLECYSTECTOMY OPEN    . COLONOSCOPY N/A 05/08/2014   Procedure: COLONOSCOPY;  Surgeon: [name], MD;  Location: [facility];  Service: Endoscopy;  Laterality: N/A;  . DILATION AND CURETTAGE OF UTERUS    . ESOPHAGOGASTRODUODENOSCOPY N/A 05/08/2014  Procedure: ESOPHAGOGASTRODUODENOSCOPY (EGD);  Surgeon: [name], MD;  Location: [name];  Service: Endoscopy;  Laterality: N/A;  . EXPLORATORY LAPAROTOMY    . EYE SURGERY    . FOOT SURGERY Right X 2   "took blood out of my arms and put platelets in my feet"  . INCISION AND DRAINAGE ABSCESS     Chest  . JOINT REPLACEMENT    . REFRACTIVE SURGERY Bilateral   . THORACIC DISCECTOMY N/A 02/17/2018   Procedure: THORACIC ONE - THORACIC TWO  LAMINECTOMY;  Surgeon: Newman Pies, MD;  Location: [place];  Service: Neurosurgery;  Laterality: N/A;  THORACIC ONE - THORACIC TWO LAMINECTOMY  . TONSILLECTOMY    . TOTAL KNEE ARTHROPLASTY Left 10/2003  . TOTAL KNEE ARTHROPLASTY Right 01/2004   [name]     reports that she quit smoking about 31 years ago. Her smoking use included cigarettes. She has a 20.00 pack-year smoking history. She has never used smokeless tobacco. She reports that she drinks alcohol. She reports that she does not use drugs.  Allergies  Allergen Reactions  . Ace Inhibitors Anaphylaxis and Cough    Tolerates Irbesartan (home med)  . Penicillins Hives and Other (See Comments)    Tolerated ceftriaxone in 2019  Has patient had a PCN reaction causing immediate rash, facial/tongue/throat swelling, SOB or lightheadedness with hypotension: Yes Has patient had a PCN reaction causing severe rash involving mucus membranes or skin necrosis:  No Has patient had a PCN reaction that required hospitalization: No Has patient had a PCN reaction occurring within the last 10 years: No If all of the above answers are "NO", then may proceed with Cephalosporin use.  . Adhesive [Tape] Hives and Rash    Family History  Problem Relation Age of Onset  . Esophageal cancer Brother   . Esophageal cancer Sister        ?  . Colon polyps Brother   . Pancreatic cancer Sister        ?  . Diabetes Sister   . Diabetes Unknown        Aunt and Uncle  . Heart disease Maternal Grandfather    Prior to Admission medications   Medication Sig Start Date End Date Taking? Authorizing Provider  allopurinol (ZYLOPRIM) 300 MG tablet Take 300 mg by mouth daily.    [provider]  bisacodyl (DULCOLAX) 10 MG suppository Place 1 suppository (10 mg total) rectally daily as needed for moderate constipation. 02/19/18   Regalado, Belkys A, MD  Cholecalciferol (VITAMIN D-3) 1000 units CAPS Take 2,000 Units by mouth daily.    [provider]   cyclobenzaprine (FLEXERIL) 10 MG tablet Take 1 tablet (10 mg total) by mouth 3 (three) times daily as needed for muscle spasms. Patient taking differently: Take 10 mg by mouth 3 (three) times daily.  02/22/18   Regalado, Belkys A, MD  cycloSPORINE (RESTASIS) 0.05 % ophthalmic emulsion Place 1 drop into both eyes 2 (two) times daily.     [provider]  docusate sodium (COLACE) 100 MG capsule Take 1 capsule (100 mg total) by mouth 2 (two) times daily. 02/19/18   Regalado, Belkys A, MD  fluticasone (FLONASE) 50 MCG/ACT nasal spray SHAKE LIQUID AND USE 2 SPRAYS IN EACH NOSTRIL DAILY Patient taking differently: Place 2 sprays into both nostrils daily.  03/08/17   Collene Gobble, MD  folic acid (FOLVITE) 1 MG tablet Take 1 mg by mouth once a day 09/03/17   Aline August, MD  gabapentin (NEURONTIN) 800 MG tablet Take 800 mg by mouth 3 (three) times daily.    [provider]  HYDROcodone-acetaminophen (NORCO/VICODIN) 5-325 MG tablet Take 2 tablets by mouth every 6 (six) hours as needed for moderate pain. Patient taking differently: Take 2 tablets by mouth 4 (four) times daily.  02/19/18   Regalado, Belkys A, MD  Hydrocortisone (GERHARDT'S BUTT CREAM) CREA Apply 1 application topically 4 (four) times daily. Patient not taking: Reported on 03/16/2018 02/19/18   Regalado, Jerald Kief A, MD  insulin aspart (NOVOLOG) 100 UNIT/ML injection Inject 2-10 Units into the skin See admin instructions. With meals. Per sliding scale. If CBG is 201-250= 2 units 251-300= 4 units 301-350= 6 units 351-400= 8 units 401-450= 10 units. If >450 call MD.    [provider]  insulin glargine (LANTUS) 100 UNIT/ML injection Inject 30 Units into the skin at bedtime.    [provider]  insulin NPH Human (NOVOLIN N RELION) 100 UNIT/ML injection Inject 0.4 mLs (40 Units total) into the skin 2 (two) times daily at 8 am and 10 pm. Patient not taking: Reported on 03/16/2018 02/19/18   Regalado, Jerald Kief A, MD    Insulin Syringe-Needle U-100 (INSULIN SYRINGE 1CC/31GX5/16") 31G X 5/16" 1 ML MISC USE TO INJECT INSULIN TWICE DAILY 05/11/17   Renato Shin, MD  ipratropium (ATROVENT) 0.03 % nasal spray USE 2 SPRAYS IN EACH NOSTRIL THREE TIMES DAILY AS NEEDED FOR RHINITIS Patient taking differently: Place 2 sprays into both nostrils 3 (three) times daily as needed for rhinitis.  03/17/17   Collene Gobble, MD  irbesartan (AVAPRO) 300 MG tablet Take 300 mg by mouth daily.    [provider]  lidocaine (LIDODERM) 5 % Place 1 patch onto the skin daily. Remove & Discard patch within 12 hours or as directed by MD 02/19/18   Regalado, Jerald Kief A, MD  loratadine (CLARITIN) 10 MG tablet Take 10 mg by mouth daily.    [provider]  meclizine (ANTIVERT) 25 MG tablet Take 1 tablet (25 mg total) by mouth 3 (three) times daily as needed for dizziness. 02/06/18   Dorie Rank, MD  metFORMIN (GLUCOPHAGE) 500 MG tablet TAKE 1 TABLET(500 MG) BY MOUTH TWICE DAILY WITH A MEAL 02/28/18   Renato Shin, MD  metoprolol tartrate (LOPRESSOR) 100 MG tablet Take 100 mg by mouth 2 (two) times daily.     [provider]  montelukast (SINGULAIR) 10 MG tablet TAKE 1 TABLET(10 MG) BY MOUTH DAILY Patient taking differently: Take 10 mg by mouth at bedtime.  04/03/16   Magdalen Spatz, NP  Multiple Vitamins-Minerals (MULTIVITAMIN WITH MINERALS) tablet Take 1 tablet by mouth daily.    [provider]  nystatin cream (MYCOSTATIN) Apply 1 application topically 2 (two) times daily as needed for dry skin (skin irritation).    [provider]  ondansetron (ZOFRAN ODT) 8 MG disintegrating tablet Take 1 tablet (8 mg total) by mouth every 8 (eight) hours as needed for nausea or vomiting. Patient not taking: Reported on 03/16/2018 02/06/18   Dorie Rank, MD  ONE [facility] LANCETS MISC Use to check sugars twice daily DX E11.22 08/23/17   Renato Shin, MD  oxybutynin (DITROPAN-XL) 10 MG 24 hr tablet Take 1 tablet (10 mg total)  by mouth daily. 02/18/15   Biagio Borg, MD  oxyCODONE (OXY IR/ROXICODONE) 5 MG immediate release tablet Take 1 tablet (5 mg total) by mouth every 6 (six) hours as needed for breakthrough pain ((score 4 to 6)). 02/22/18  Regalado, Belkys A, MD  pantoprazole (PROTONIX) 40 MG tablet TAKE 1 TABLET(40 MG) BY MOUTH TWICE DAILY Patient taking differently: Take 40 mg by mouth 2 (two) times daily.  03/02/17   Collene Gobble, MD  polyethylene glycol (MIRALAX / GLYCOLAX) packet Take 17 g by mouth 2 (two) times daily. 02/22/18   Regalado, Belkys A, MD  predniSONE (DELTASONE) 20 MG tablet TAKE 1 TABLET(20 MG) BY MOUTH DAILY WITH BREAKFAST 02/02/18   Byrum, Rose Fillers, MD  ranitidine (ZANTAC) 300 MG tablet Take 1 tablet (300 mg total) by mouth 2 (two) times daily. 11/22/17   Debbe Odea, MD  senna (SENOKOT) 8.6 MG TABS tablet Take 1 tablet (8.6 mg total) by mouth daily. 02/23/18   Regalado, Belkys A, MD  sodium chloride (OCEAN) 0.65 % SOLN nasal spray Place 2 sprays into both nostrils 2 (two) times daily.    [provider]    Physical Exam: Vitals:   03/25/18 1400 03/25/18 1415 03/25/18 1532 03/25/18 1539  BP: 114/82 108/80 123/73   Pulse: (!) 119 (!) 119 (!) 119   Resp: 16 16    Temp:      TempSrc:      SpO2: 94% 92% 100% 100%  Weight:      Height:        Constitutional: NAD, calm, comfortable Eyes: PERRL, lids and conjunctivae normal ENMT: Mucous membranes are mildly dry. Posterior pharynx clear of any exudate or lesions. Neck: normal, supple, no masses, no thyromegaly Respiratory: Decreased breath sounds and crackles on bases, no rhonchi or wheezing.  Normal respiratory effort. No accessory muscle use.  Cardiovascular: Tachycardic at 161 bpm, 2/6 systolic murmur.  No rubs / gallops. No extremity edema. 2+ pedal pulses. No carotid bruits.  Abdomen: Obese, soft, no tenderness, no masses palpated. No hepatosplenomegaly. Bowel sounds positive.  Genitourinary Comments: Exam performed with RN  chaperone present.  There are superficial ulcerated lesions to the buttock and groin.  The labia is erythematous, tender, and skin appears moist.  There is no purulent drainage or vesicles.   From ED provider notes.  Musculoskeletal: no clubbing / cyanosis. Good ROM, no contractures. Normal muscle tone.  Skin: Multiple stage II and III decubiti ulcers.  Please see pictures below for further detail. Neurologic: CN 2-12 grossly intact. Sensation intact, DTR normal.  Generalized weakness. Psychiatric: Normal judgment and insight. Alert and oriented x 3. Normal mood.        Labs on Admission: I have personally reviewed following labs and imaging studies  CBC: Recent Labs  Lab 03/25/18 1057  WBC 9.4  NEUTROABS 6.4  HGB 9.9*  HCT 33.5*  MCV 88.2  PLT 096   Basic Metabolic Panel: Recent Labs  Lab 03/25/18 1057  NA 136  K 4.5  CL 97*  CO2 25  GLUCOSE 235*  BUN 21  CREATININE 1.26*  CALCIUM 9.7  MG 1.7   GFR: Estimated Creatinine Clearance: 62.5 mL/min (A) (by C-G formula based on SCr of 1.26 mg/dL (H)). Liver Function Tests: Recent Labs  Lab 03/25/18 1057  AST 17  ALT 22  ALKPHOS 78  BILITOT 0.4  PROT 7.1  ALBUMIN 2.4*   No results for input(s): LIPASE, AMYLASE in the last 168 hours. No results for input(s): AMMONIA in the last 168 hours. Coagulation Profile: Recent Labs  Lab 03/25/18 1057  INR 2.35   Cardiac Enzymes: No results for input(s): CKTOTAL, CKMB, CKMBINDEX, TROPONINI in the last 168 hours. BNP (last 3 results) No results for input(s):  PROBNP in the last 8760 hours. HbA1C: No results for input(s): HGBA1C in the last 72 hours. CBG: No results for input(s): GLUCAP in the last 168 hours. Lipid Profile: No results for input(s): CHOL, HDL, LDLCALC, TRIG, CHOLHDL, LDLDIRECT in the last 72 hours. Thyroid Function Tests: No results for input(s): TSH, T4TOTAL, FREET4, T3FREE, THYROIDAB in the last 72 hours. Anemia Panel: No results for input(s):  VITAMINB12, FOLATE, FERRITIN, TIBC, IRON, RETICCTPCT in the last 72 hours. Urine analysis:    Component Value Date/Time   COLORURINE YELLOW 03/25/2018 1134   APPEARANCEUR HAZY (A) 03/25/2018 1134   LABSPEC 1.017 03/25/2018 1134   PHURINE 5.0 03/25/2018 1134   GLUCOSEU 50 (A) 03/25/2018 1134   GLUCOSEU NEGATIVE 12/14/2011 1707   HGBUR NEGATIVE 03/25/2018 1134   BILIRUBINUR NEGATIVE 03/25/2018 1134   BILIRUBINUR n 11/16/2014 1515   KETONESUR NEGATIVE 03/25/2018 1134   PROTEINUR NEGATIVE 03/25/2018 1134   UROBILINOGEN 0.2 02/27/2015 2226   NITRITE NEGATIVE 03/25/2018 1134   LEUKOCYTESUR NEGATIVE 03/25/2018 1134    Radiological Exams on Admission: Dg Chest 2 View  Result Date: 03/25/2018 CLINICAL DATA:  Sepsis.  Weakness. EXAM: CHEST - 2 VIEW COMPARISON:  02/17/2018. FINDINGS: Mediastinum hilar structures normal. Motion artifact. Low lung volumes with bibasilar atelectasis/infiltrates. Heart size stable. No pulmonary venous congestion. Prior cervicothoracic spine fusion. IMPRESSION: Low lung volumes with mild bibasilar atelectasis/infiltrates. Electronically Signed   By: Marcello Moores  Register   On: 03/25/2018 12:12   02/12/2018 echocardiogram ------------------------------------------------------------------- LV EF: 55% -   60%  ------------------------------------------------------------------- History:   PMH:  Elevated Troponin.  Murmur.  PMH:  Sepsis.  Risk factors:  Former tobacco use. Hypertension. Diabetes mellitus.  ------------------------------------------------------------------- Study Conclusions  - Left ventricle: The cavity size was normal. Wall thickness was   increased in a pattern of mild LVH. Systolic function was normal.   The estimated ejection fraction was in the range of 55% to 60%.   Wall motion was normal; there were no regional wall motion   abnormalities. The study is not technically sufficient to allow   evaluation of LV diastolic function. - Aortic  valve: Mildly to moderately calcified annulus. Moderately   thickened leaflets. Mean gradient (S): 8 mm Hg. Valve area (VTI):   1.79 cm^2. Valve area (Vmax): 1.52 cm^2. - Technically difficult study. Echocontrast was used to enhance   visualization.  EKG: Independently reviewed.    Assessment/Plan Principal Problem:   HCAP (healthcare-associated pneumonia) Admit to telemetry/inpatient. Continue supplemental oxygen. Bronchodilators as needed (Xopenex). Aztreonam 2 g IVPB every 8 hours. Vancomycin per pharmacy. Follow-up blood culture and sensitivity. Check sputum Gram stain, culture and sensitivity. Check strep pneumoniae urinary antigen.  Active Problems:   Fungal skin infection Start Lotrimin topically twice daily. Nystatin powder to area 3 times daily. Fluconazole 150 mg p.o. daily x7 days.    Pressure injury of the skin Continue local care. Consult wound/ostomy team.    Essential hypertension Continue Avapro 300 mg p.o. daily. Continue metoprolol 100 mg p.o. twice daily. Monitor blood pressure, heart rate, renal function electrolytes.    GERD Protonix 40 mg p.o. daily.    Sleep apnea Continue CPAP at bedtime.    Gout Continue allopurinol 300 mg a day.    Diabetes mellitus type 2 in obese (HCC) Last hemoglobin A1c 8.5% on February 12, 2018. Carbohydrate modified diet. CBG monitoring with regular insulin sliding scale.    DM neuropathy, type II diabetes mellitus (HCC) Continue gabapentin 800 mg p.o. 3 times daily.    Elevated INR  Unsure if the patient was started on [facility]. No med reconciliation sheet from NH available. Facility not answering phone earlier. DVT suspicion on 09/25, but negative doppler US. See ER notes. Notified our pharmacy. Continue DVT prophylaxis at this time until clarified.    DVT prophylaxis: SQ Lovenox Code Status: Full code. Family Communication: Disposition Plan: Admit for IV antibiotic therapy for several  days. Consults called: Admission status: Inpatient/telemetry.   Reubin Milan MD [facility] Pager [phone].  If 7PM-7AM, please contact night-coverage [email or web address] Password [email or web address]  03/25/2018, 4:00 PM

## 2018-03-25 NOTE — ED Provider Notes (Signed)
[facility] EMERGENCY DEPARTMENT Provider Note   CSN: [id number] Arrival date & time: 03/25/18  1015     History   Chief Complaint Chief Complaint  Patient presents with  . Blood Infection    HPI Christy May is a 66 y.o. female w PMHx T2DM, OSA, diabetic neuropathy, presenting to the emergency department via EMS from [facility] rehab facility with complaints of generalized weakness x4 days.  She states she has been feeling progressively weak and shaky though without nauseated upper respiratory symptoms, abdominal complaints, urinary complaints.  She does have ongoing rash to groin that has been persistent for multiple weeks without change.  She states this morning she was feeling worse, per EMS her temp was 101.5 F she was given 650 mg of Tylenol.  Patient without other complaints.  The history is provided by the patient and the EMS personnel.    Past Medical History:  Diagnosis Date  . Allergy   . Anemia, unspecified   . Anxiety   . Arthritis    "neck, back, hands" (09/03/2014)  . Chronic airway obstruction, not elsewhere classified    "I was told I don't have this/tests done @ [facility] 05/2014"  . Chronic back pain   . Colon polyps   . Complication of anesthesia    "I've had recall; I probably stopped breathing at least 2 times" (09/03/2014)  . Dehydration   . Depressive disorder, not elsewhere classified   . Diabetic peripheral neuropathy ([place])   . Dysphagia 2007   historyof dysphagia with severe dysmotility by barium swallow-[name]  . Esophageal reflux   . Family history of adverse reaction to anesthesia    "my brother was told they lost him a couple times during OR"  . Heart murmur   . History of hiatal hernia   . Migraine    "couple times/month right now" (09/03/2014)  . Obesity, unspecified   . OSA (obstructive sleep apnea)     CPAP  . Septic shock ([place]) 08/30/2017  . Spondylosis of unspecified site without mention of  myelopathy   . Type II diabetes mellitus ([place])   . Unspecified essential hypertension   . Unspecified menopausal and postmenopausal disorder   . Unspecified vitamin D deficiency   . Upper airway cough syndrome     Patient Active Problem List   Diagnosis Date Noted  . HCAP (healthcare-associated pneumonia) 03/25/2018  . Thoracic spondylosis with myelopathy 02/17/2018  . Spondylosis, thoracic, with myelopathy 02/17/2018  . Dehydration 02/11/2018  . AKI (acute kidney injury) ([place]) 02/11/2018  . Hypotension 02/11/2018  . Syncope, vasovagal 02/11/2018  . Syncope 02/11/2018  . DM neuropathy, type II diabetes mellitus ([place]) 11/18/2017  . Weakness 11/18/2017  . SIRS (systemic inflammatory response syndrome) ([place]) 11/17/2017  . Diabetes mellitus type 2 in obese ([place]) 11/17/2017  . Acute lower UTI 11/17/2017  . Septic shock ([place]) 08/30/2017  . Acute renal failure (ARF) ([place]) 08/13/2017  . Pressure injury of skin 08/13/2017  . Sepsis due to gram-negative UTI ([place]) 08/12/2017  . Upper airway cough syndrome 09/28/2016  . Acute bronchitis 10/29/2015  . Diabetes ([place]) 08/21/2015  . Chest pain 07/14/2015  . Dyspnea on exertion 07/05/2015  . Obstructive sleep apnea 07/05/2015  . Constipation 03/05/2015  . Allergic rhinitis 03/05/2015  . Hearing loss 03/05/2015  . Vocal cord dysfunction 03/05/2015  . Pain in joint, shoulder region 09/11/2014  . Weakness generalized 09/02/2014  . Acute esophagitis 05/08/2014  . Benign neoplasm of sigmoid colon  05/08/2014  . Change in bowel habits 04/17/2014  . Fecal incontinence 04/17/2014  . Urge incontinence 04/08/2014  . Cough 12/06/2013  . Orthostasis 07/18/2013  . Acute kidney injury ([place]) 07/18/2013  . Bilateral carpal tunnel syndrome 02/14/2013  . Gout 01/04/2013  . Failure to thrive 06/28/2012  . Gait disorder 06/28/2012  . Lower abdominal pain 12/14/2011  . Preventative health care 08/23/2011  . Cervical radiculopathy 02/11/2011  . Low  back pain 02/11/2011  . VITAMIN D DEFICIENCY 11/22/2009  . ANEMIA-NOS 11/22/2009  . DEGENERATIVE JOINT DISEASE, CERVICAL SPINE 11/22/2009  . Depression 09/16/2009  . POLYNEUROPATHY 09/16/2009  . Morbid obesity ([place]) 09/09/2009  . Essential hypertension 09/09/2009  . GERD 09/09/2009  . Sleep apnea 09/09/2009    Past Surgical History:  Procedure Laterality Date  . ABDOMINAL HYSTERECTOMY     "partial"  . ANTERIOR CERVICAL DECOMP/DISCECTOMY FUSION     multiple cervical spine levels;Dr. Arnoldo Morale  . APPENDECTOMY    . BACK SURGERY    . CHOLECYSTECTOMY OPEN    . COLONOSCOPY N/A 05/08/2014   Procedure: COLONOSCOPY;  Surgeon: [name], MD;  Location: [facility];  Service: Endoscopy;  Laterality: N/A;  . DILATION AND CURETTAGE OF UTERUS    . ESOPHAGOGASTRODUODENOSCOPY N/A 05/08/2014   Procedure: ESOPHAGOGASTRODUODENOSCOPY (EGD);  Surgeon: [name], MD;  Location: [name];  Service: Endoscopy;  Laterality: N/A;  . EXPLORATORY LAPAROTOMY    . EYE SURGERY    . FOOT SURGERY Right X 2   "took blood out of my arms and put platelets in my feet"  . INCISION AND DRAINAGE ABSCESS     Chest  . JOINT REPLACEMENT    . REFRACTIVE SURGERY Bilateral   . THORACIC DISCECTOMY N/A 02/17/2018   Procedure: THORACIC ONE - THORACIC TWO LAMINECTOMY;  Surgeon: Newman Pies, MD;  Location: [facility];  Service: Neurosurgery;  Laterality: N/A;  THORACIC ONE - THORACIC TWO LAMINECTOMY  . TONSILLECTOMY    . TOTAL KNEE ARTHROPLASTY Left 10/2003  . TOTAL KNEE ARTHROPLASTY Right 01/2004   [name]     OB History   None      Home Medications    Prior to Admission medications   Medication Sig Start Date End Date Taking? Authorizing Provider  allopurinol (ZYLOPRIM) 300 MG tablet Take 300 mg by mouth daily.    [provider]  bisacodyl (DULCOLAX) 10 MG suppository Place 1 suppository (10 mg total) rectally daily as needed for moderate constipation. 02/19/18   Regalado, Belkys A, MD    Cholecalciferol (VITAMIN D-3) 1000 units CAPS Take 2,000 Units by mouth daily.    [provider]  cyclobenzaprine (FLEXERIL) 10 MG tablet Take 1 tablet (10 mg total) by mouth 3 (three) times daily as needed for muscle spasms. Patient taking differently: Take 10 mg by mouth 3 (three) times daily.  02/22/18   Regalado, Belkys A, MD  cycloSPORINE (RESTASIS) 0.05 % ophthalmic emulsion Place 1 drop into both eyes 2 (two) times daily.     [provider]  docusate sodium (COLACE) 100 MG capsule Take 1 capsule (100 mg total) by mouth 2 (two) times daily. 02/19/18   Regalado, Belkys A, MD  fluticasone (FLONASE) 50 MCG/ACT nasal spray SHAKE LIQUID AND USE 2 SPRAYS IN EACH NOSTRIL DAILY Patient taking differently: Place 2 sprays into both nostrils daily.  03/08/17   Collene Gobble, MD  folic acid (FOLVITE) 1 MG tablet Take 1 mg by mouth once a day 09/03/17   Aline August, MD  gabapentin (NEURONTIN) 800 MG tablet Take 800 mg by mouth 3 (three) times daily.    [provider]  HYDROcodone-acetaminophen (NORCO/VICODIN) 5-325 MG tablet Take 2 tablets by mouth every 6 (six) hours as needed for moderate pain. Patient taking differently: Take 2 tablets by mouth 4 (four) times daily.  02/19/18   Regalado, Belkys A, MD  Hydrocortisone (GERHARDT'S BUTT CREAM) CREA Apply 1 application topically 4 (four) times daily. Patient not taking: Reported on 03/16/2018 02/19/18   Regalado, Jerald Kief A, MD  insulin aspart (NOVOLOG) 100 UNIT/ML injection Inject 2-10 Units into the skin See admin instructions. With meals. Per sliding scale. If CBG is 201-250= 2 units 251-300= 4 units 301-350= 6 units 351-400= 8 units 401-450= 10 units. If >450 call MD.    [provider]  insulin glargine (LANTUS) 100 UNIT/ML injection Inject 30 Units into the skin at bedtime.    [provider]  insulin NPH Human (NOVOLIN N RELION) 100 UNIT/ML injection Inject 0.4 mLs (40 Units total) into the skin 2 (two)  times daily at 8 am and 10 pm. Patient not taking: Reported on 03/16/2018 02/19/18   Regalado, Jerald Kief A, MD  Insulin Syringe-Needle U-100 (INSULIN SYRINGE 1CC/31GX5/16") 31G X 5/16" 1 ML MISC USE TO INJECT INSULIN TWICE DAILY 05/11/17   Renato Shin, MD  ipratropium (ATROVENT) 0.03 % nasal spray USE 2 SPRAYS IN EACH NOSTRIL THREE TIMES DAILY AS NEEDED FOR RHINITIS Patient taking differently: Place 2 sprays into both nostrils 3 (three) times daily as needed for rhinitis.  03/17/17   Collene Gobble, MD  irbesartan (AVAPRO) 300 MG tablet Take 300 mg by mouth daily.    [provider]  lidocaine (LIDODERM) 5 % Place 1 patch onto the skin daily. Remove & Discard patch within 12 hours or as directed by MD 02/19/18   Regalado, Jerald Kief A, MD  loratadine (CLARITIN) 10 MG tablet Take 10 mg by mouth daily.    [provider]  meclizine (ANTIVERT) 25 MG tablet Take 1 tablet (25 mg total) by mouth 3 (three) times daily as needed for dizziness. 02/06/18   Dorie Rank, MD  metFORMIN (GLUCOPHAGE) 500 MG tablet TAKE 1 TABLET(500 MG) BY MOUTH TWICE DAILY WITH A MEAL 02/28/18   Renato Shin, MD  metoprolol tartrate (LOPRESSOR) 100 MG tablet Take 100 mg by mouth 2 (two) times daily.     [provider]  montelukast (SINGULAIR) 10 MG tablet TAKE 1 TABLET(10 MG) BY MOUTH DAILY Patient taking differently: Take 10 mg by mouth at bedtime.  04/03/16   Magdalen Spatz, NP  Multiple Vitamins-Minerals (MULTIVITAMIN WITH MINERALS) tablet Take 1 tablet by mouth daily.    [provider]  nystatin cream (MYCOSTATIN) Apply 1 application topically 2 (two) times daily as needed for dry skin (skin irritation).    [provider]  ondansetron (ZOFRAN ODT) 8 MG disintegrating tablet Take 1 tablet (8 mg total) by mouth every 8 (eight) hours as needed for nausea or vomiting. Patient not taking: Reported on 03/16/2018 02/06/18   Dorie Rank, MD  ONE [facility] LANCETS MISC Use to check sugars twice daily DX  E11.22 08/23/17   Renato Shin, MD  oxybutynin (DITROPAN-XL) 10 MG 24 hr tablet Take 1 tablet (10 mg total) by mouth daily. 02/18/15   Biagio Borg, MD  oxyCODONE (OXY IR/ROXICODONE) 5 MG immediate release tablet Take 1 tablet (5 mg total) by mouth every 6 (six) hours as needed for breakthrough pain ((score 4 to 6)). 02/22/18  Regalado, Belkys A, MD  pantoprazole (PROTONIX) 40 MG tablet TAKE 1 TABLET(40 MG) BY MOUTH TWICE DAILY Patient taking differently: Take 40 mg by mouth 2 (two) times daily.  03/02/17   Collene Gobble, MD  polyethylene glycol (MIRALAX / GLYCOLAX) packet Take 17 g by mouth 2 (two) times daily. 02/22/18   Regalado, Belkys A, MD  predniSONE (DELTASONE) 20 MG tablet TAKE 1 TABLET(20 MG) BY MOUTH DAILY WITH BREAKFAST 02/02/18   Byrum, Rose Fillers, MD  ranitidine (ZANTAC) 300 MG tablet Take 1 tablet (300 mg total) by mouth 2 (two) times daily. 11/22/17   Debbe Odea, MD  senna (SENOKOT) 8.6 MG TABS tablet Take 1 tablet (8.6 mg total) by mouth daily. 02/23/18   Regalado, Belkys A, MD  sodium chloride (OCEAN) 0.65 % SOLN nasal spray Place 2 sprays into both nostrils 2 (two) times daily.    [provider]    Family History Family History  Problem Relation Age of Onset  . Esophageal cancer Brother   . Esophageal cancer Sister        ?  . Colon polyps Brother   . Pancreatic cancer Sister        ?  . Diabetes Sister   . Diabetes Unknown        Aunt and Uncle  . Heart disease Maternal Grandfather     Social History Social History   Tobacco Use  . Smoking status: Former Smoker    Packs/day: 1.00    Years: 20.00    Pack years: 20.00    Types: Cigarettes    Last attempt to quit: 06/22/1986    Years since quitting: 31.7  . Smokeless tobacco: Never Used  Substance Use Topics  . Alcohol use: Yes  . Drug use: No     Allergies   Ace inhibitors; Penicillins; and Adhesive [tape]   Review of Systems Review of Systems  Constitutional: Positive for fever.  HENT: Negative  for congestion and sore throat.   Respiratory: Negative for cough.   Gastrointestinal: Negative for abdominal pain, diarrhea and nausea.  Genitourinary: Negative for dysuria and frequency.  Skin: Positive for rash.  Neurological: Positive for weakness (Generalized).  All other systems reviewed and are negative.    Physical Exam Updated Vital Signs BP 123/73 (BP Location: Right Arm)   Pulse (!) 119   Temp 98.2 F (36.8 C) (Oral)   Resp 16   Ht 5\' 4"  (1.626 m)   Wt (!) 149.7 kg   SpO2 100%   BMI 56.64 kg/m   Physical Exam  Constitutional: She appears well-developed and well-nourished. No distress.  Morbidly obese.  HENT:  Head: Normocephalic and atraumatic.  Eyes: Pupils are equal, round, and reactive to light. Conjunctivae and EOM are normal.  Neck: Normal range of motion. Neck supple.  Cardiovascular: Regular rhythm.  Tachycardic  Pulmonary/Chest: Effort normal and breath sounds normal. No respiratory distress.  Abdominal: Soft. Bowel sounds are normal. There is no tenderness. There is no rebound and no guarding.  Genitourinary:  Genitourinary Comments: Exam performed with RN chaperone present.  There are superficial ulcerated lesions to the buttock and groin.  The labia is erythematous, tender, and skin appears moist.  There is no purulent drainage or vesicles.  Neurological: She is alert.  Skin: Skin is warm.  Psychiatric: She has a normal mood and affect. Her behavior is normal.  Nursing note and vitals reviewed.    ED Treatments / Results  Labs (all labs ordered are listed, but  only abnormal results are displayed) Labs Reviewed  COMPREHENSIVE METABOLIC PANEL - Abnormal; Notable for the following components:      Result Value   Chloride 97 (*)    Glucose, Bld 235 (*)    Creatinine, Ser 1.26 (*)    Albumin 2.4 (*)    GFR calc non Af Amer 43 (*)    GFR calc Af Amer 50 (*)    All other components within normal limits  CBC WITH DIFFERENTIAL/PLATELET - Abnormal;  Notable for the following components:   RBC 3.80 (*)    Hemoglobin 9.9 (*)    HCT 33.5 (*)    MCHC 29.6 (*)    RDW 16.0 (*)    All other components within normal limits  PROTIME-INR - Abnormal; Notable for the following components:   Prothrombin Time 25.5 (*)    All other components within normal limits  URINALYSIS, ROUTINE W REFLEX MICROSCOPIC - Abnormal; Notable for the following components:   APPearance HAZY (*)    Glucose, UA 50 (*)    All other components within normal limits  I-STAT CG4 LACTIC ACID, ED - Abnormal; Notable for the following components:   Lactic Acid, Venous 2.42 (*)    All other components within normal limits  CULTURE, BLOOD (ROUTINE X 2)  CULTURE, BLOOD (ROUTINE X 2)  URINE CULTURE  EXPECTORATED SPUTUM ASSESSMENT W REFEX TO RESP CULTURE  GRAM STAIN  MAGNESIUM  STREP PNEUMONIAE URINARY ANTIGEN  LACTIC ACID, PLASMA  LACTIC ACID, PLASMA  I-STAT CG4 LACTIC ACID, ED    EKG None  Radiology Dg Chest 2 View  Result Date: 03/25/2018 CLINICAL DATA:  Sepsis.  Weakness. EXAM: CHEST - 2 VIEW COMPARISON:  02/17/2018. FINDINGS: Mediastinum hilar structures normal. Motion artifact. Low lung volumes with bibasilar atelectasis/infiltrates. Heart size stable. No pulmonary venous congestion. Prior cervicothoracic spine fusion. IMPRESSION: Low lung volumes with mild bibasilar atelectasis/infiltrates. Electronically Signed   By: Marcello Moores  Register   On: 03/25/2018 12:12    Procedures Procedures (including critical care time) CRITICAL CARE Performed by: [place] N Riot Waterworth   Total critical care time: 40 minutes  Critical care time was exclusive of separately billable procedures and treating other patients.  Critical care was necessary to treat or prevent imminent or life-threatening deterioration.  Critical care was time spent personally by me on the following activities: development of treatment plan with patient and/or surrogate as well as nursing, discussions with  consultants, evaluation of patient's response to treatment, examination of patient, obtaining history from patient or surrogate, ordering and performing treatments and interventions, ordering and review of laboratory studies, ordering and review of radiographic studies, pulse oximetry and re-evaluation of patient's condition.   Medications Ordered in ED Medications  magnesium sulfate IVPB 2 g 50 mL (has no administration in time range)  aztreonam (AZACTAM) 2 g in sodium chloride 0.9 % 100 mL IVPB (has no administration in time range)  enoxaparin (LOVENOX) injection 40 mg (has no administration in time range)  ondansetron (ZOFRAN) tablet 4 mg (has no administration in time range)    Or  ondansetron (ZOFRAN) injection 4 mg (has no administration in time range)  acetaminophen (TYLENOL) tablet 650 mg (has no administration in time range)    Or  acetaminophen (TYLENOL) suppository 650 mg (has no administration in time range)  levalbuterol (XOPENEX) nebulizer solution 0.63 mg (has no administration in time range)  levalbuterol (XOPENEX) nebulizer solution 0.63 mg (0.63 mg Nebulization Given 03/25/18 1538)  ipratropium (ATROVENT) nebulizer solution 0.5 mg (0.5 mg  Nebulization Given 03/25/18 1538)  vancomycin (VANCOCIN) 1,250 mg in sodium chloride 0.9 % 250 mL IVPB (has no administration in time range)  insulin aspart (novoLOG) injection 0-20 Units (has no administration in time range)  acetaminophen (TYLENOL) tablet 650 mg (650 mg Oral Given 03/25/18 1228)  sodium chloride 0.9 % bolus 1,000 mL (0 mLs Intravenous Stopped 03/25/18 1525)  aztreonam (AZACTAM) 2 g in sodium chloride 0.9 % 100 mL IVPB (0 g Intravenous Stopped 03/25/18 1327)  vancomycin (VANCOCIN) 2,500 mg in sodium chloride 0.9 % 500 mL IVPB (2,500 mg Intravenous New Bag/Given 03/25/18 1325)     Initial Impression / Assessment and Plan / ED Course  I have reviewed the triage vital signs and the nursing notes.  Pertinent labs & imaging  results that were available during my care of the patient were reviewed by me and considered in my medical decision making (see chart for details).    Patient presenting from rehab facility with generalized weakness x4 days and fever acute onset today.  No obvious symptoms of infection, though patient is tachycardic on arrival and pressures are borderline hypotensive.  She was initially treated with IV fluids and Tylenol.  Labs without leukocytosis though lactic acid is elevated at 2.4.  Chest x-ray showing bibasilar infiltrates. Urine neg. Blood cultures sent. Patient treated with antibiotics to cover possible HCAP given recent admission and stay at rehab facility. Pt re-evaluated with some improvement in BP after fluids. Hospitalist consulted for admission.   Pt discussed with and evaluated by Dr. Sherry Ruffing.  The patient appears reasonably stabilized for admission considering the current resources, flow, and capabilities available in the ED at this time, and I doubt any other [facility] requiring further screening and/or treatment in the ED prior to admission.   Final Clinical Impressions(s) / ED Diagnoses   Final diagnoses:  HCAP (healthcare-associated pneumonia)    ED Discharge Orders    None       Jaymes Revels, [place] N, [place] 03/25/18 1617    Tegeler, Gwenyth Allegra, MD 03/26/18 [phone]

## 2018-03-25 NOTE — Progress Notes (Signed)
Patient with numerous areas of skin breakdown over buttocks. Several larger, deeper areas (See flow sheet) with some bleeding noted. Groin, buttocks and perineal area with diffuse redness, swelling and areas of skin breakdown. Abdominal skin fold with redness and multiple areas of skin breakdown. Charge nurse Ginger made aware, wound care/skin consult order placed. All areas cleansed and barrier cream applied.  Roselyn Reef Koren Plyler,RN

## 2018-03-25 NOTE — ED Triage Notes (Signed)
Per EMS pt is from [facility] Re[facility].  Complains of generalized weakness over the last 4 days.  This morning she felt worse her temp this morning was 101.5 and she was given 623 of tylenol.  With EMS her temp was 98.4.  She is currenlty sinus tach with a RA sat of 95.  AOx4 NAD noted at this time.

## 2018-03-25 NOTE — Progress Notes (Signed)
Pt placed on CPAP auto titrate with full face mask per that's what patient uses at home.  Pt tolerating well at this time.  RT will continue to monitor.

## 2018-03-25 NOTE — ED Notes (Signed)
First liter of .09 NS finished.

## 2018-03-26 DIAGNOSIS — Z794 Long term (current) use of insulin: Secondary | ICD-10-CM

## 2018-03-26 DIAGNOSIS — I10 Essential (primary) hypertension: Secondary | ICD-10-CM

## 2018-03-26 DIAGNOSIS — E1140 Type 2 diabetes mellitus with diabetic neuropathy, unspecified: Secondary | ICD-10-CM

## 2018-03-26 DIAGNOSIS — E1169 Type 2 diabetes mellitus with other specified complication: Secondary | ICD-10-CM

## 2018-03-26 DIAGNOSIS — J189 Pneumonia, unspecified organism: Secondary | ICD-10-CM

## 2018-03-26 DIAGNOSIS — E669 Obesity, unspecified: Secondary | ICD-10-CM

## 2018-03-26 LAB — GLUCOSE, CAPILLARY
GLUCOSE-CAPILLARY: 162 mg/dL — AB (ref 70–99)
GLUCOSE-CAPILLARY: 135 mg/dL — AB (ref 70–99)
Glucose-Capillary: 187 mg/dL — ABNORMAL HIGH (ref 70–99)
Glucose-Capillary: 101 mg/dL — ABNORMAL HIGH (ref 70–99)

## 2018-03-26 LAB — CBC WITH DIFFERENTIAL/PLATELET
BASOS PCT: 0 %
Basophils Absolute: 0 10*3/uL (ref 0.0–0.1)
EOS PCT: 1 %
Eosinophils Absolute: 0.1 10*3/uL (ref 0.0–0.7)
HCT: 29.5 % — ABNORMAL LOW (ref 36.0–46.0)
HEMOGLOBIN: 9 g/dL — AB (ref 12.0–15.0)
Lymphocytes Relative: 22 %
Lymphs Abs: 1.7 10*3/uL (ref 0.7–4.0)
MCH: 26.9 pg (ref 26.0–34.0)
MCHC: 30.5 g/dL (ref 30.0–36.0)
MCV: 88.1 fL (ref 78.0–100.0)
MONO ABS: 0.7 10*3/uL (ref 0.1–1.0)
Monocytes Relative: 9 %
NEUTROS PCT: 68 %
Neutro Abs: 5.4 10*3/uL (ref 1.7–7.7)
PLATELETS: 256 10*3/uL (ref 150–400)
RBC: 3.35 MIL/uL — ABNORMAL LOW (ref 3.87–5.11)
RDW: 16.2 % — ABNORMAL HIGH (ref 11.5–15.5)
WBC: 7.9 10*3/uL (ref 4.0–10.5)

## 2018-03-26 LAB — URINE CULTURE: Culture: <10000 — AB

## 2018-03-26 LAB — COMPREHENSIVE METABOLIC PANEL
ALT: 19 U/L (ref 0–44)
AST: 20 U/L (ref 15–41)
Albumin: 2.1 g/dL — ABNORMAL LOW (ref 3.5–5.0)
Alkaline Phosphatase: 65 U/L (ref 38–126)
Anion gap: 12 (ref 5–15)
BILIRUBIN TOTAL: 1.3 mg/dL — AB (ref 0.3–1.2)
BUN: 13 mg/dL (ref 8–23)
CHLORIDE: 104 mmol/L (ref 98–111)
CO2: 21 mmol/L — ABNORMAL LOW (ref 22–32)
CREATININE: 0.94 mg/dL (ref 0.44–1.00)
Calcium: 8.6 mg/dL — ABNORMAL LOW (ref 8.9–10.3)
Glucose, Bld: 183 mg/dL — ABNORMAL HIGH (ref 70–99)
POTASSIUM: 4.8 mmol/L (ref 3.5–5.1)
SODIUM: 137 mmol/L (ref 135–145)
TOTAL PROTEIN: 6 g/dL — AB (ref 6.5–8.1)

## 2018-03-26 LAB — MRSA PCR SCREENING: MRSA BY PCR: POSITIVE — AB

## 2018-03-26 LAB — STREP PNEUMONIAE URINARY ANTIGEN: Strep Pneumo Urinary Antigen: NEGATIVE

## 2018-03-26 MED ORDER — METOPROLOL TARTRATE 50 MG PO TABS
50.0000 mg | ORAL_TABLET | Freq: Two times a day (BID) | ORAL | Status: DC
  Administered 2018-03-26: 50 mg via ORAL
  Filled 2018-03-26: qty 1

## 2018-03-26 MED ORDER — SODIUM CHLORIDE 0.9 % IV BOLUS
500.0000 mL | Freq: Once | INTRAVENOUS | Status: AC
  Administered 2018-03-26: 500 mL via INTRAVENOUS

## 2018-03-26 MED ORDER — METOPROLOL TARTRATE 100 MG PO TABS
100.0000 mg | ORAL_TABLET | Freq: Two times a day (BID) | ORAL | Status: DC
  Administered 2018-03-26 – 2018-03-28 (×4): 100 mg via ORAL
  Filled 2018-03-26 (×4): qty 1

## 2018-03-26 MED ORDER — SODIUM CHLORIDE 0.9 % IV SOLN
INTRAVENOUS | Status: AC
  Administered 2018-03-26 (×2): via INTRAVENOUS

## 2018-03-26 MED ORDER — INSULIN GLARGINE 100 UNIT/ML ~~LOC~~ SOLN
15.0000 [IU] | Freq: Every day | SUBCUTANEOUS | Status: DC
  Administered 2018-03-26 – 2018-03-28 (×3): 15 [IU] via SUBCUTANEOUS
  Filled 2018-03-26 (×3): qty 0.15

## 2018-03-26 MED ORDER — CHLORHEXIDINE GLUCONATE CLOTH 2 % EX PADS
6.0000 | MEDICATED_PAD | Freq: Every day | CUTANEOUS | Status: DC
  Administered 2018-03-26 – 2018-03-28 (×2): 6 via TOPICAL

## 2018-03-26 MED ORDER — MUPIROCIN 2 % EX OINT
1.0000 "application " | TOPICAL_OINTMENT | Freq: Two times a day (BID) | CUTANEOUS | Status: DC
  Administered 2018-03-26 – 2018-03-28 (×5): 1 via NASAL
  Filled 2018-03-26 (×2): qty 22

## 2018-03-26 MED ORDER — ENOXAPARIN SODIUM 80 MG/0.8ML ~~LOC~~ SOLN
75.0000 mg | SUBCUTANEOUS | Status: DC
  Administered 2018-03-26 – 2018-03-27 (×2): 75 mg via SUBCUTANEOUS
  Filled 2018-03-26 (×2): qty 0.8

## 2018-03-26 MED ORDER — SODIUM CHLORIDE 0.9 % IV SOLN
2.0000 g | Freq: Two times a day (BID) | INTRAVENOUS | Status: DC
  Administered 2018-03-26 – 2018-03-28 (×5): 2 g via INTRAVENOUS
  Filled 2018-03-26 (×6): qty 2

## 2018-03-26 NOTE — Progress Notes (Signed)
Pt sustaining HR in 120s throughout the day. Other vital signs stable, except for a low-grade temp of 99.3. No signs of distress. MD notified. Advised to give 500 cc bolus of NS. Will continue to monitor.

## 2018-03-26 NOTE — Progress Notes (Addendum)
MD paged about patient's heart rate being 131 even after the bolus. Patient is not symptomatic and is alert and oriented.

## 2018-03-26 NOTE — Progress Notes (Signed)
Pt completed 500 cc bolus of normal saline. HR continues to sustain in 120s. MD made aware. Will continue to monitor.

## 2018-03-26 NOTE — [place] FL2 (Signed)
[place] MEDICAID FL2 LEVEL OF CARE SCREENING TOOL     IDENTIFICATION  Patient Name: Christy May Birthdate: 01/05/1951 Sex: female Admission Date (Current Location): 03/25/2018  [facility] and [place] Number:  Herbalist and Address:  [facility], [place] [address], [place], [place] [address]      Provider Number: [id number]  Attending Physician Name and Address:  Caren Griffins, MD  Relative Name and Phone Number:  [name], daughter, [phone]    Current Level of Care: Hospital Recommended Level of Care: [place] Prior Approval Number:    Date Approved/Denied:   PASRR Number: [id number]  Discharge Plan: SNF    Current Diagnoses: Patient Active Problem List   Diagnosis Date Noted  . HCAP (healthcare-associated pneumonia) 03/25/2018  . Fungal skin infection 03/25/2018  . Thoracic spondylosis with myelopathy 02/17/2018  . Spondylosis, thoracic, with myelopathy 02/17/2018  . Dehydration 02/11/2018  . AKI (acute kidney injury) ([place]) 02/11/2018  . Hypotension 02/11/2018  . Syncope, vasovagal 02/11/2018  . Syncope 02/11/2018  . DM neuropathy, type II diabetes mellitus ([place]) 11/18/2017  . Weakness 11/18/2017  . SIRS (systemic inflammatory response syndrome) ([place]) 11/17/2017  . Diabetes mellitus type 2 in obese ([place]) 11/17/2017  . Acute lower UTI 11/17/2017  . Septic shock ([place]) 08/30/2017  . Acute renal failure (ARF) ([place]) 08/13/2017  . Pressure injury of skin 08/13/2017  . Sepsis due to gram-negative UTI ([place]) 08/12/2017  . Upper airway cough syndrome 09/28/2016  . Acute bronchitis 10/29/2015  . Diabetes ([place]) 08/21/2015  . Chest pain 07/14/2015  . Dyspnea on exertion 07/05/2015  . Obstructive sleep apnea 07/05/2015  . Constipation 03/05/2015  . Allergic rhinitis 03/05/2015  . Hearing loss 03/05/2015  . Vocal cord dysfunction 03/05/2015  . Pain in joint, shoulder region 09/11/2014  . Weakness generalized  09/02/2014  . Acute esophagitis 05/08/2014  . Benign neoplasm of sigmoid colon 05/08/2014  . Change in bowel habits 04/17/2014  . Fecal incontinence 04/17/2014  . Urge incontinence 04/08/2014  . Cough 12/06/2013  . Orthostasis 07/18/2013  . Acute kidney injury ([place]) 07/18/2013  . Bilateral carpal tunnel syndrome 02/14/2013  . Gout 01/04/2013  . Failure to thrive 06/28/2012  . Gait disorder 06/28/2012  . Lower abdominal pain 12/14/2011  . Preventative health care 08/23/2011  . Cervical radiculopathy 02/11/2011  . Low back pain 02/11/2011  . VITAMIN D DEFICIENCY 11/22/2009  . ANEMIA-NOS 11/22/2009  . DEGENERATIVE JOINT DISEASE, CERVICAL SPINE 11/22/2009  . Depression 09/16/2009  . POLYNEUROPATHY 09/16/2009  . Morbid obesity ([place]) 09/09/2009  . Essential hypertension 09/09/2009  . GERD 09/09/2009  . Sleep apnea 09/09/2009    Orientation RESPIRATION BLADDER Height & Weight     Self, Time, Situation, Place  Normal Incontinent, External catheter Weight: (!) 149.7 kg Height:  5\' 4"  (162.6 cm)  BEHAVIORAL SYMPTOMS/MOOD NEUROLOGICAL BOWEL NUTRITION STATUS      Incontinent Diet(Please see DC Summary)  AMBULATORY STATUS COMMUNICATION OF NEEDS Skin   Extensive Assist Verbally PU Stage and Appropriate Care(Stage II on buttocks)                       Personal Care Assistance Level of Assistance  Bathing, Feeding, Dressing Bathing Assistance: Maximum assistance Feeding assistance: Limited assistance Dressing Assistance: Limited assistance     Functional Limitations Info  Sight, Hearing, Speech Sight Info: Adequate Hearing Info: Adequate Speech Info: Adequate    SPECIAL CARE FACTORS FREQUENCY  PT (By licensed PT), OT (  By licensed OT)     PT Frequency: 5x/week OT Frequency: 3x/week            Contractures Contractures Info: Not present    Additional Factors Info  Code Status, Allergies, Isolation Precautions, Insulin Sliding Scale Code Status Info:  Full Allergies Info: Allergies:  Ace Inhibitors, Penicillins, Adhesive Tape   Insulin Sliding Scale Info: 3x daily with meals Isolation Precautions Info: MRSA in the nose     Current Medications (03/26/2018):  This is the current hospital active medication list Current Facility-Administered Medications  Medication Dose Route Frequency Provider Last Rate Last Dose  . 0.9 %  sodium chloride infusion   Intravenous Continuous Caren Griffins, MD 100 mL/hr at 03/26/18 0843    . acetaminophen (TYLENOL) tablet 650 mg  650 mg Oral Q6H PRN Reubin Milan, MD   650 mg at 03/25/18 2243   Or  . acetaminophen (TYLENOL) suppository 650 mg  650 mg Rectal Q6H PRN Reubin Milan, MD      . allopurinol (ZYLOPRIM) tablet 300 mg  300 mg Oral Daily Reubin Milan, MD   300 mg at 03/26/18 0855  . bisacodyl (DULCOLAX) suppository 10 mg  10 mg Rectal Daily PRN Reubin Milan, MD      . ceFEPIme (MAXIPIME) 2 g in sodium chloride 0.9 % 100 mL IVPB  2 g Intravenous Q12H Caren Griffins, MD      . Chlorhexidine Gluconate Cloth 2 % PADS 6 each  6 each Topical Q0600 Thurnell Lose, MD   6 each at 03/26/18 0845  . cholecalciferol (VITAMIN D) tablet 2,000 Units  2,000 Units Oral Daily Reubin Milan, MD   2,000 Units at 03/26/18 [phone]  . clotrimazole (LOTRIMIN) 1 % cream   Topical BID Reubin Milan, MD      . cyclobenzaprine [facility]) tablet 10 mg  10 mg Oral TID PRN Reubin Milan, MD      . cycloSPORINE (RESTASIS) 0.05 % ophthalmic emulsion 1 drop  1 drop Both Eyes BID Reubin Milan, MD   1 drop at 03/26/18 0849  . docusate sodium (COLACE) capsule 100 mg  100 mg Oral BID Reubin Milan, MD   100 mg at 03/25/18 2243  . enoxaparin (LOVENOX) injection 75 mg  75 mg Subcutaneous Q24H Marzetta Board M, MD      . fluconazole (DIFLUCAN) tablet 150 mg  150 mg Oral Daily Reubin Milan, MD   150 mg at 03/26/18 0854  . fluticasone (FLONASE) 50 MCG/ACT nasal spray 2 spray  2  spray Each Nare Daily Reubin Milan, MD   2 spray at 03/26/18 [phone]  . folic acid (FOLVITE) tablet 1 mg  1 mg Oral Daily Reubin Milan, MD   1 mg at 03/26/18 4627  . gabapentin (NEURONTIN) capsule 800 mg  800 mg Oral TID Reubin Milan, MD   800 mg at 03/26/18 0853  . insulin aspart (novoLOG) injection 0-20 Units  0-20 Units Subcutaneous TID WC Reubin Milan, MD   4 Units at 03/26/18 [phone]  . insulin glargine (LANTUS) injection 15 Units  15 Units Subcutaneous Daily Caren Griffins, MD   15 Units at 03/26/18 1051  . ipratropium (ATROVENT) nebulizer solution 0.5 mg  0.5 mg Nebulization Q6H Reubin Milan, MD   0.5 mg at 03/26/18 0756  . levalbuterol (XOPENEX) nebulizer solution 0.63 mg  0.63 mg Nebulization Q6H PRN Reubin Milan, MD      .  levalbuterol (XOPENEX) nebulizer solution 0.63 mg  0.63 mg Nebulization Q6H Reubin Milan, MD   0.63 mg at 03/26/18 0755  . loratadine (CLARITIN) tablet 10 mg  10 mg Oral Daily Reubin Milan, MD   10 mg at 03/26/18 0855  . meclizine (ANTIVERT) tablet 25 mg  25 mg Oral TID PRN Reubin Milan, MD      . metFORMIN (GLUCOPHAGE) tablet 1,000 mg  1,000 mg Oral BID WC Reubin Milan, MD   1,000 mg at 03/26/18 2831  . metoprolol tartrate (LOPRESSOR) tablet 50 mg  50 mg Oral BID Caren Griffins, MD      . mupirocin ointment (BACTROBAN) 2 % 1 application  1 application Nasal BID Thurnell Lose, MD   1 application at 51/76/16 1051  . nystatin (MYCOSTATIN/NYSTOP) topical powder   Topical TID Reubin Milan, MD      . ondansetron [facility]) tablet 4 mg  4 mg Oral Q6H PRN Reubin Milan, MD       Or  . ondansetron [facility]) injection 4 mg  4 mg Intravenous Q6H PRN Reubin Milan, MD      . pantoprazole (PROTONIX) EC tablet 40 mg  40 mg Oral BID Reubin Milan, MD   40 mg at 03/26/18 0854  . polyethylene glycol (MIRALAX / GLYCOLAX) packet 17 g  17 g Oral BID PRN Reubin Milan, MD      . senna  [facility]) tablet 8.6 mg  1 tablet Oral Daily Reubin Milan, MD      . vancomycin [facility]) 1,250 mg in sodium chloride 0.9 % 250 mL IVPB  1,250 mg Intravenous Q12H Reubin Milan, MD 166.7 mL/hr at 03/26/18 0104 1,250 mg at 03/26/18 0104     Discharge Medications: Please see discharge summary for a list of discharge medications.  Relevant Imaging Results:  Relevant Lab Results:   Additional Information SSN: [id number]  Benard Halsted, LCSW

## 2018-03-26 NOTE — Progress Notes (Signed)
Pharmacy Antibiotic Note  Christy May is a 67 y.o. female admitted on 03/25/2018 with sepsis.  Pharmacy originally consulted for vancomycin/aztreonam dosing. Now aztreonam being changed to Cefepime for HCAP. Pt with PCN allergy (hives) but tolerates cephalosporins.  WBC wnl, Tm 100.2.   Plan: -Continue Vancomycin 1250mg  q12h -Cefepime 2gm IV q12h -Will f/u micro data and pt's clinical condition -Consider Vanc trough at Css in obese pt  Height: 5\' 4"  (162.6 cm) Weight: (!) 330 lb (149.7 kg) IBW/kg (Calculated) : 54.7  Temp (24hrs), Avg:99.5 F (37.5 C), Min:98.2 F (36.8 C), Max:100.2 F (37.9 C)  Recent Labs  Lab 03/25/18 1057 03/25/18 1107 03/25/18 1637 03/25/18 1828 03/26/18 0247  WBC 9.4  --   --   --  7.9  CREATININE 1.26*  --   --   --  0.94  LATICACIDVEN  --  2.42* 1.1 1.2  --     Estimated Creatinine Clearance: 83.8 mL/min (by C-G formula based on SCr of 0.94 mg/dL).    Allergies  Allergen Reactions  . Ace Inhibitors Anaphylaxis and Cough    Tolerates Irbesartan (home med)  . Penicillins Hives and Other (See Comments)    Tolerated ceftriaxone in 2019  Has patient had a PCN reaction causing immediate rash, facial/tongue/throat swelling, SOB or lightheadedness with hypotension: Yes Has patient had a PCN reaction causing severe rash involving mucus membranes or skin necrosis:  No Has patient had a PCN reaction that required hospitalization: No Has patient had a PCN reaction occurring within the last 10 years: No If all of the above answers are "NO", then may proceed with Cephalosporin use.  . Adhesive [Tape] Hives and Rash    Antimicrobials this admission: vancomycin 10/4 >>  Aztreonam 10/4 >> 10/5 Cefepime 10/5>>  Dose adjustments this admission: N/A  Microbiology results: 10/4 Bld x2: 10/4 Urine:  10/5 MRSA PCR: positive  Thank you for allowing pharmacy to be a part of this patient's care.  Sherlon Handing, PharmD, BCPS Clinical  pharmacist  **Pharmacist phone directory can now be found on [email or web address] (PW [email or web address]).  Listed under [place]. 03/26/2018 7:20 AM

## 2018-03-26 NOTE — Progress Notes (Signed)
Notified by lab that pt is positive for MRSA in the nares. Pt placed on contact precautions.

## 2018-03-26 NOTE — Progress Notes (Signed)
Pt temp 100.2, HR sustaines in high 120s, other VS WDL. Pt asymptomatic.  Cruzita Lederer, MD notified.

## 2018-03-26 NOTE — Progress Notes (Signed)
PROGRESS NOTE  Christy May PYP:[id number] DOB: Nov 14, 1950 DOA: 03/25/2018 PCP: Lucianne Lei, MD   LOS: 1 day   Brief Narrative / Interim history: 67 year old female with history of hypertension, chronic back pain, diabetes, OSA on CPAP, anxiety/depression, chronic pain, who is brought to the hospital from her nursing home due to fever, shortness of breath, cough and nausea for the past 4 days.  Evaluation on admission suggested infiltrate on the chest x-ray and patient was admitted for healthcare associated pneumonia and placed on broad-spectrum IV antibiotics  Subjective: -No complaints for me this morning, denies any shortness of breath, denies any chest pain, no abdominal pain, nausea or vomiting.  She is confused and not alerted to place or time.  She also tells me she is having a cough for a few days.  Assessment & Plan: Principal Problem:   HCAP (healthcare-associated pneumonia) Active Problems:   Essential hypertension   GERD   Sleep apnea   Gout   Pressure injury of skin   Diabetes mellitus type 2 in obese (HCC)   DM neuropathy, type II diabetes mellitus ([place])   Fungal skin infection   Sepsis due to Healthcare associated pneumonia causing acute metabolic encephalopathy -On exam this morning patient was confused, repetitive, did not know where she is or what the year is -Started on broad-spectrum antibiotics with vancomycin and aztreonam, she tolerated cephalosporins in the past and will discontinue aztreonam and changed to cefepime per pharmacy -Continue IV fluids -Follow cultures  Sinus tachycardia -Potentially related to #1, sustained in the 120s 130s, she is on metoprolol at home and this was held on admission and may suggest a rebound tachycardia.  Resume metoprolol.  She has no chest pain and is otherwise asymptomatic from the standpoint  Pressure injury of the buttocks/fungal skin infection -Continue Lotrimin, nystatin, and fluconazole p.o. x7 days -Continue  local care and wound was consulted  Hypertension -Resume metoprolol, monitor,  OSA -Continue CPAP  Diabetes mellitus type 2 in obese (HCC) -Last hemoglobin A1c 8.5% on February 12, 2018. -Add Lantus and continue sliding scale  DM neuropathy, type II diabetes mellitus (HCC) -Continue gabapentin 800 mg p.o. 3 times daily.  Elevated INR -Unsure if the patient was started on [facility] at the [facility]. No med reconciliation sheet from NH available. DVT suspicion on 09/25, but negative doppler US. See ER notes. Notified our pharmacy. Continue DVT prophylaxis at this time until clarified.  Scheduled Meds: . allopurinol  300 mg Oral Daily  . Chlorhexidine Gluconate Cloth  6 each Topical Q0600  . cholecalciferol  2,000 Units Oral Daily  . clotrimazole   Topical BID  . cycloSPORINE  1 drop Both Eyes BID  . docusate sodium  100 mg Oral BID  . enoxaparin (LOVENOX) injection  75 mg Subcutaneous Q24H  . fluconazole  150 mg Oral Daily  . fluticasone  2 spray Each Nare Daily  . folic acid  1 mg Oral Daily  . gabapentin  800 mg Oral TID  . insulin aspart  0-20 Units Subcutaneous TID WC  . insulin glargine  15 Units Subcutaneous Daily  . ipratropium  0.5 mg Nebulization Q6H  . levalbuterol  0.63 mg Nebulization Q6H  . loratadine  10 mg Oral Daily  . metFORMIN  1,000 mg Oral BID WC  . metoprolol tartrate  50 mg Oral BID  . mupirocin ointment  1 application Nasal BID  . nystatin   Topical TID  . pantoprazole  40 mg Oral BID  . senna  1 tablet Oral Daily   Continuous Infusions: . sodium chloride 100 mL/hr at 03/26/18 0843  . ceFEPime (MAXIPIME) IV    . vancomycin 1,250 mg (03/26/18 0104)   PRN Meds:.acetaminophen **OR** acetaminophen, bisacodyl, cyclobenzaprine, levalbuterol, meclizine, ondansetron **OR** ondansetron (ZOFRAN) IV, polyethylene glycol  DVT prophylaxis: lovenox Code Status: Full code Family Communication: no family at bedside Disposition Plan: back to SNF when ready   Consultants:     None   Procedures:   none  Antimicrobials:  Vancomycin 10/4 >>  Cefepime 10/5 >>  Aztreonam 10/4 >> 10/5   Objective: Vitals:   03/25/18 2231 03/25/18 2338 03/26/18 0617 03/26/18 0758  BP: (!) 104/57  115/66   Pulse: (!) 128 (!) 128 (!) 130   Resp:  16    Temp: 99.3 F (37.4 C)  100.2 F (37.9 C)   TempSrc: Oral  Oral   SpO2: 95% 95% 95% 94%  Weight:      Height:        Intake/Output Summary (Last 24 hours) at 03/26/2018 1211 Last data filed at 03/26/2018 0558 Gross per 24 hour  Intake 2528.65 ml  Output 1051 ml  Net 1477.65 ml   Filed Weights   03/25/18 1033  Weight: (!) 149.7 kg    Examination:  Constitutional: Confused, not appears to be in distress Eyes: PERRL, lids and conjunctivae normal ENMT: Mucous membranes are moist. Respiratory: Overall decreased breath sounds, exam difficult due to morbid obesity but no wheezing or crackles heard.  Normal respiratory effort Cardiovascular: Regular rate and rhythm, no murmurs / rubs / gallops. No LE edema. 2+ pedal pulses. Abdomen: no tenderness. Bowel sounds positive.  Musculoskeletal: no clubbing / cyanosis.  Skin: no rashes, lesions, ulcers. No induration Neurologic: CN 2-12 grossly intact. Strength 5/5 in all 4.  Psychiatric: Alert to self only   Data Reviewed: I have independently reviewed following labs and imaging studies  Chest x-ray 10/4 -low lung volumes with blunting of the costophrenic angles, cannot exclude infiltrates  CBC: Recent Labs  Lab 03/25/18 1057 03/26/18 0247  WBC 9.4 7.9  NEUTROABS 6.4 5.4  HGB 9.9* 9.0*  HCT 33.5* 29.5*  MCV 88.2 88.1  PLT 335 950   Basic Metabolic Panel: Recent Labs  Lab 03/25/18 1057 03/26/18 0247  NA 136 137  K 4.5 4.8  CL 97* 104  CO2 25 21*  GLUCOSE 235* 183*  BUN 21 13  CREATININE 1.26* 0.94  CALCIUM 9.7 8.6*  MG 1.7  --    GFR: Estimated Creatinine Clearance: 83.8 mL/min (by C-G formula based on SCr of 0.94 mg/dL). Liver Function  Tests: Recent Labs  Lab 03/25/18 1057 03/26/18 0247  AST 17 20  ALT 22 19  ALKPHOS 78 65  BILITOT 0.4 1.3*  PROT 7.1 6.0*  ALBUMIN 2.4* 2.1*   No results for input(s): LIPASE, AMYLASE in the last 168 hours. No results for input(s): AMMONIA in the last 168 hours. Coagulation Profile: Recent Labs  Lab 03/25/18 1057  INR 2.35   Cardiac Enzymes: No results for input(s): CKTOTAL, CKMB, CKMBINDEX, TROPONINI in the last 168 hours. BNP (last 3 results) No results for input(s): PROBNP in the last 8760 hours. HbA1C: No results for input(s): HGBA1C in the last 72 hours. CBG: Recent Labs  Lab 03/25/18 1713 03/25/18 2230 03/26/18 0804  GLUCAP 125* 259* 162*   Lipid Profile: No results for input(s): CHOL, HDL, LDLCALC, TRIG, CHOLHDL, LDLDIRECT in the last 72 hours. Thyroid Function Tests: No results for input(s): TSH, T4TOTAL,  FREET4, T3FREE, THYROIDAB in the last 72 hours. Anemia Panel: No results for input(s): VITAMINB12, FOLATE, FERRITIN, TIBC, IRON, RETICCTPCT in the last 72 hours. Urine analysis:    Component Value Date/Time   COLORURINE YELLOW 03/25/2018 1134   APPEARANCEUR HAZY (A) 03/25/2018 1134   LABSPEC 1.017 03/25/2018 1134   PHURINE 5.0 03/25/2018 1134   GLUCOSEU 50 (A) 03/25/2018 1134   GLUCOSEU NEGATIVE 12/14/2011 1707   HGBUR NEGATIVE 03/25/2018 1134   BILIRUBINUR NEGATIVE 03/25/2018 1134   BILIRUBINUR n 11/16/2014 1515   KETONESUR NEGATIVE 03/25/2018 1134   PROTEINUR NEGATIVE 03/25/2018 1134   UROBILINOGEN 0.2 02/27/2015 2226   NITRITE NEGATIVE 03/25/2018 1134   LEUKOCYTESUR NEGATIVE 03/25/2018 1134   Sepsis Labs: Invalid input(s): PROCALCITONIN, LACTICIDVEN  Recent Results (from the past 240 hour(s))  Culture, blood (Routine x 2)     Status: None (Preliminary result)   Collection Time: 03/25/18 10:55 AM  Result Value Ref Range Status   Specimen Description BLOOD LEFT ANTECUBITAL  Final   Special Requests   Final    BOTTLES DRAWN AEROBIC AND  ANAEROBIC Blood Culture results may not be optimal due to an inadequate volume of blood received in culture bottles   Culture   Final    NO GROWTH < 12 HOURS Performed at [facility] [address], [place], [place] [address]    Report Status PENDING  Incomplete  Culture, blood (Routine x 2)     Status: None (Preliminary result)   Collection Time: 03/25/18 10:57 AM  Result Value Ref Range Status   Specimen Description BLOOD BLOOD RIGHT HAND  Final   Special Requests   Final    BOTTLES DRAWN AEROBIC AND ANAEROBIC Blood Culture results may not be optimal due to an inadequate volume of blood received in culture bottles   Culture   Final    NO GROWTH < 12 HOURS Performed at [facility], [place] [address] [place], [place] [address]    Report Status PENDING  Incomplete  Urine culture     Status: Abnormal   Collection Time: 03/25/18 11:34 AM  Result Value Ref Range Status   Specimen Description URINE, CATHETERIZED  Final   Special Requests NONE  Final   Culture (A)  Final    <10,000 COLONIES/mL INSIGNIFICANT GROWTH Performed at [facility] [address] [place], [place] [address]    Report Status 03/26/2018 FINAL  Final  MRSA PCR Screening     Status: Abnormal   Collection Time: 03/26/18  1:00 AM  Result Value Ref Range Status   MRSA by PCR POSITIVE (A) NEGATIVE Final    Comment:        The GeneXpert MRSA Assay (FDA approved for NASAL specimens only), is one component of a comprehensive MRSA colonization surveillance program. It is not intended to diagnose MRSA infection nor to guide or monitor treatment for MRSA infections. CRITICAL RESULT CALLED TO, READ BACK BY AND VERIFIED WITH: CALLED C. ROBINSON @ 1696 ON 03/26/18 BY ROBINSON Z.        Radiology Studies: Dg Chest 2 View  Result Date: 03/25/2018 CLINICAL DATA:  Sepsis.  Weakness. EXAM: CHEST - 2 VIEW COMPARISON:  02/17/2018. FINDINGS: Mediastinum hilar structures normal. Motion artifact. Low lung  volumes with bibasilar atelectasis/infiltrates. Heart size stable. No pulmonary venous congestion. Prior cervicothoracic spine fusion. IMPRESSION: Low lung volumes with mild bibasilar atelectasis/infiltrates. Electronically Signed   By: [place]   On: 03/25/2018 12:12   Marzetta Board, MD,  PhD [facility] Pager [phone]  If 7PM-7AM, please contact night-coverage [email or web address] Password [email or web address] 03/26/2018, 12:11 PM

## 2018-03-27 DIAGNOSIS — B369 Superficial mycosis, unspecified: Secondary | ICD-10-CM

## 2018-03-27 LAB — CBC WITH DIFFERENTIAL/PLATELET
BASOS PCT: 1 %
Basophils Absolute: 0.1 10*3/uL (ref 0.0–0.1)
EOS PCT: 3 %
Eosinophils Absolute: 0.2 10*3/uL (ref 0.0–0.7)
HCT: 30.2 % — ABNORMAL LOW (ref 36.0–46.0)
Hemoglobin: 8.6 g/dL — ABNORMAL LOW (ref 12.0–15.0)
Lymphocytes Relative: 33 %
Lymphs Abs: 2.5 10*3/uL (ref 0.7–4.0)
MCH: 26 pg (ref 26.0–34.0)
MCHC: 28.5 g/dL — ABNORMAL LOW (ref 30.0–36.0)
MCV: 91.2 fL (ref 78.0–100.0)
MONO ABS: 0.8 10*3/uL (ref 0.1–1.0)
Monocytes Relative: 10 %
NEUTROS ABS: 3.9 10*3/uL (ref 1.7–7.7)
Neutrophils Relative %: 53 %
Platelets: 265 10*3/uL (ref 150–400)
RBC: 3.31 MIL/uL — ABNORMAL LOW (ref 3.87–5.11)
RDW: 16.3 % — AB (ref 11.5–15.5)
WBC: 7.5 10*3/uL (ref 4.0–10.5)

## 2018-03-27 LAB — COMPREHENSIVE METABOLIC PANEL
ALT: 23 U/L (ref 0–44)
AST: 17 U/L (ref 15–41)
Albumin: 1.9 g/dL — ABNORMAL LOW (ref 3.5–5.0)
Alkaline Phosphatase: 63 U/L (ref 38–126)
Anion gap: 11 (ref 5–15)
BILIRUBIN TOTAL: 0.5 mg/dL (ref 0.3–1.2)
BUN: 9 mg/dL (ref 8–23)
CO2: 24 mmol/L (ref 22–32)
CREATININE: 0.92 mg/dL (ref 0.44–1.00)
Calcium: 8.9 mg/dL (ref 8.9–10.3)
Chloride: 105 mmol/L (ref 98–111)
GFR calc Af Amer: >60 mL/min (ref >60)
Glucose, Bld: 108 mg/dL — ABNORMAL HIGH (ref 70–99)
POTASSIUM: 4.1 mmol/L (ref 3.5–5.1)
Sodium: 140 mmol/L (ref 135–145)
TOTAL PROTEIN: 5.7 g/dL — AB (ref 6.5–8.1)

## 2018-03-27 LAB — GLUCOSE, CAPILLARY
GLUCOSE-CAPILLARY: 133 mg/dL — AB (ref 70–99)
GLUCOSE-CAPILLARY: 157 mg/dL — AB (ref 70–99)
Glucose-Capillary: 113 mg/dL — ABNORMAL HIGH (ref 70–99)

## 2018-03-27 NOTE — Progress Notes (Signed)
PROGRESS NOTE  Christy May ZJQ:[id number] DOB: 07/04/1950 DOA: 03/25/2018 PCP: Lucianne Lei, MD   LOS: 2 days   Brief Narrative / Interim history: 67 year old female with history of hypertension, chronic back pain, diabetes, OSA on CPAP, anxiety/depression, chronic pain, who is brought to the hospital from her nursing home due to fever, shortness of breath, cough and nausea for the past 4 days.  Evaluation on admission suggested infiltrate on the chest x-ray and patient was admitted for healthcare associated pneumonia and placed on broad-spectrum IV antibiotics  Subjective: -Much more alert this morning, no complaints, no shortness of breath or chest pain.  No abdominal pain, nausea or vomiting.  Still coughing  Assessment & Plan: Principal Problem:   HCAP (healthcare-associated pneumonia) Active Problems:   Essential hypertension   GERD   Sleep apnea   Gout   Pressure injury of skin   Diabetes mellitus type 2 in obese (HCC)   DM neuropathy, type II diabetes mellitus ([place])   Fungal skin infection   Sepsis due to Healthcare associated pneumonia causing acute metabolic encephalopathy -Confusion seems to have resolved -Continue broad-spectrum antibiotics of cefepime, cultures are still pending, de-escalate tomorrow at 48 hours if they remain negative still -No longer needs IV fluids  Sinus tachycardia -Potentially related to #1, sustained in the 120s 130s, she is on metoprolol at home and this was held on admission and may suggest a rebound tachycardia. -Resolved with resumption of her home metoprolol  Pressure injury of the buttocks/fungal skin infection -Continue Lotrimin, nystatin, and fluconazole p.o. x7 days -Continue local care and wound was consulted  Hypertension -Blood pressure within normal parameters this morning, continue current regimen  OSA -Continue CPAP  Diabetes mellitus type 2 in obese (HCC) -Last hemoglobin A1c 8.5% on February 12, 2018. -Glucose  acceptable this morning 100-130, continue current regimen  DM neuropathy, type II diabetes mellitus (HCC) -Continue gabapentin 800 mg p.o. 3 times daily.  Elevated INR -Unsure if the patient was started on [facility] at the [facility]. No med reconciliation sheet from NH available. DVT suspicion on 09/25, but negative doppler US. See ER notes. Notified our pharmacy. Continue DVT prophylaxis at this time until clarified.  Scheduled Meds: . allopurinol  300 mg Oral Daily  . Chlorhexidine Gluconate Cloth  6 each Topical Q0600  . cholecalciferol  2,000 Units Oral Daily  . clotrimazole   Topical BID  . cycloSPORINE  1 drop Both Eyes BID  . docusate sodium  100 mg Oral BID  . enoxaparin (LOVENOX) injection  75 mg Subcutaneous Q24H  . fluconazole  150 mg Oral Daily  . fluticasone  2 spray Each Nare Daily  . folic acid  1 mg Oral Daily  . gabapentin  800 mg Oral TID  . insulin aspart  0-20 Units Subcutaneous TID WC  . insulin glargine  15 Units Subcutaneous Daily  . ipratropium  0.5 mg Nebulization Q6H  . levalbuterol  0.63 mg Nebulization Q6H  . loratadine  10 mg Oral Daily  . metFORMIN  1,000 mg Oral BID WC  . metoprolol tartrate  100 mg Oral BID  . mupirocin ointment  1 application Nasal BID  . nystatin   Topical TID  . pantoprazole  40 mg Oral BID  . senna  1 tablet Oral Daily   Continuous Infusions: . ceFEPime (MAXIPIME) IV 2 g (03/27/18 0951)  . vancomycin 1,250 mg (03/27/18 0040)   PRN Meds:.acetaminophen **OR** acetaminophen, bisacodyl, cyclobenzaprine, levalbuterol, meclizine, ondansetron **OR** ondansetron (ZOFRAN) IV, polyethylene glycol  DVT prophylaxis: lovenox Code Status: Full code Family Communication: no family at bedside Disposition Plan: back to SNF when ready, stable today possibly tomorrow  Consultants:   None   Procedures:   none  Antimicrobials:  Vancomycin 10/4 >>  Cefepime 10/5 >>  Aztreonam 10/4 >> 10/5   Objective: Vitals:   03/26/18 2238 03/27/18 0132  03/27/18 0707 03/27/18 0815  BP: (!) 103/52  116/63   Pulse: 88 87 90   Resp: 17 18 17    Temp: 99 F (37.2 C)     TempSrc: Oral     SpO2:  100% 100% 100%  Weight:      Height:        Intake/Output Summary (Last 24 hours) at 03/27/2018 1215 Last data filed at 03/27/2018 1010 Gross per 24 hour  Intake 1183.95 ml  Output 3400 ml  Net -2216.05 ml   Filed Weights   03/25/18 1033  Weight: (!) 149.7 kg    Examination:  Constitutional: No distress Eyes: No scleral icterus, equal round and reactive ENMT: Moist mucous membranes Respiratory: Overall decreased breath sounds due to morbid obesity but no wheezing or crackles heard Cardiovascular: Regular rate and rhythm, no murmurs heard, no peripheral edema Abdomen: Obese, soft, nontender, nondistended, positive bowel sounds Skin: no rashes seen, refer to H&P pictures for buttocks Neurologic: Nonfocal, equal strength Psychiatric: Alert and oriented x3   Data Reviewed: I have independently reviewed following labs and imaging studies   CBC: Recent Labs  Lab 03/25/18 1057 03/26/18 0247 03/27/18 0402  WBC 9.4 7.9 7.5  NEUTROABS 6.4 5.4 3.9  HGB 9.9* 9.0* 8.6*  HCT 33.5* 29.5* 30.2*  MCV 88.2 88.1 91.2  PLT 335 256 518   Basic Metabolic Panel: Recent Labs  Lab 03/25/18 1057 03/26/18 0247 03/27/18 0402  NA 136 137 140  K 4.5 4.8 4.1  CL 97* 104 105  CO2 25 21* 24  GLUCOSE 235* 183* 108*  BUN 21 13 9   CREATININE 1.26* 0.94 0.92  CALCIUM 9.7 8.6* 8.9  MG 1.7  --   --    GFR: Estimated Creatinine Clearance: 85.6 mL/min (by C-G formula based on SCr of 0.92 mg/dL). Liver Function Tests: Recent Labs  Lab 03/25/18 1057 03/26/18 0247 03/27/18 0402  AST 17 20 17   ALT 22 19 23   ALKPHOS 78 65 63  BILITOT 0.4 1.3* 0.5  PROT 7.1 6.0* 5.7*  ALBUMIN 2.4* 2.1* 1.9*   No results for input(s): LIPASE, AMYLASE in the last 168 hours. No results for input(s): AMMONIA in the last 168 hours. Coagulation Profile: Recent Labs    Lab 03/25/18 1057  INR 2.35   Cardiac Enzymes: No results for input(s): CKTOTAL, CKMB, CKMBINDEX, TROPONINI in the last 168 hours. BNP (last 3 results) No results for input(s): PROBNP in the last 8760 hours. HbA1C: No results for input(s): HGBA1C in the last 72 hours. CBG: Recent Labs  Lab 03/26/18 0804 03/26/18 1147 03/26/18 1656 03/26/18 2236 03/27/18 0814  GLUCAP 162* 187* 135* 101* 133*   Lipid Profile: No results for input(s): CHOL, HDL, LDLCALC, TRIG, CHOLHDL, LDLDIRECT in the last 72 hours. Thyroid Function Tests: No results for input(s): TSH, T4TOTAL, FREET4, T3FREE, THYROIDAB in the last 72 hours. Anemia Panel: No results for input(s): VITAMINB12, FOLATE, FERRITIN, TIBC, IRON, RETICCTPCT in the last 72 hours. Urine analysis:    Component Value Date/Time   COLORURINE YELLOW 03/25/2018 1134   APPEARANCEUR HAZY (A) 03/25/2018 1134   LABSPEC 1.017 03/25/2018 1134   PHURINE 5.0  03/25/2018 1134   GLUCOSEU 50 (A) 03/25/2018 1134   GLUCOSEU NEGATIVE 12/14/2011 1707   HGBUR NEGATIVE 03/25/2018 1134   BILIRUBINUR NEGATIVE 03/25/2018 1134   BILIRUBINUR n 11/16/2014 1515   [place] 03/25/2018 1134   PROTEINUR NEGATIVE 03/25/2018 1134   UROBILINOGEN 0.2 02/27/2015 2226   NITRITE NEGATIVE 03/25/2018 1134   LEUKOCYTESUR NEGATIVE 03/25/2018 1134   Sepsis Labs: Invalid input(s): PROCALCITONIN, LACTICIDVEN  Recent Results (from the past 240 hour(s))  Culture, blood (Routine x 2)     Status: None (Preliminary result)   Collection Time: 03/25/18 10:55 AM  Result Value Ref Range Status   Specimen Description BLOOD LEFT ANTECUBITAL  Final   Special Requests   Final    BOTTLES DRAWN AEROBIC AND ANAEROBIC Blood Culture results may not be optimal due to an inadequate volume of blood received in culture bottles   Culture   Final    NO GROWTH 1 DAY Performed at [facility] [address] [place], [place] [address]    Report Status PENDING  Incomplete   Culture, blood (Routine x 2)     Status: None (Preliminary result)   Collection Time: 03/25/18 10:57 AM  Result Value Ref Range Status   Specimen Description BLOOD BLOOD RIGHT HAND  Final   Special Requests   Final    BOTTLES DRAWN AEROBIC AND ANAEROBIC Blood Culture results may not be optimal due to an inadequate volume of blood received in culture bottles   Culture   Final    NO GROWTH 1 DAY Performed at [facility], [place] [address] [place], [place] [address]    Report Status PENDING  Incomplete  Urine culture     Status: Abnormal   Collection Time: 03/25/18 11:34 AM  Result Value Ref Range Status   Specimen Description URINE, CATHETERIZED  Final   Special Requests NONE  Final   Culture (A)  Final    <10,000 COLONIES/mL INSIGNIFICANT GROWTH Performed at [facility] [address] [place], [place] [address]    Report Status 03/26/2018 FINAL  Final  MRSA PCR Screening     Status: Abnormal   Collection Time: 03/26/18  1:00 AM  Result Value Ref Range Status   MRSA by PCR POSITIVE (A) NEGATIVE Final    Comment:        The GeneXpert MRSA Assay (FDA approved for NASAL specimens only), is one component of a comprehensive MRSA colonization surveillance program. It is not intended to diagnose MRSA infection nor to guide or monitor treatment for MRSA infections. CRITICAL RESULT CALLED TO, READ BACK BY AND VERIFIED WITH: CALLED C. ROBINSON @ 9147 ON 03/26/18 BY ROBINSON Z.        Radiology Studies: No results found. Marzetta Board, MD, PhD [facility] Pager [phone]  If 7PM-7AM, please contact night-coverage [email or web address] Password [email or web address] 03/27/2018, 12:15 PM

## 2018-03-28 ENCOUNTER — Inpatient Hospital Stay (HOSPITAL_COMMUNITY): Payer: Medicare Other

## 2018-03-28 DIAGNOSIS — K219 Gastro-esophageal reflux disease without esophagitis: Secondary | ICD-10-CM

## 2018-03-28 LAB — PROTIME-INR
INR: 1.22
Prothrombin Time: 15.3 seconds — ABNORMAL HIGH (ref 11.4–15.2)

## 2018-03-28 LAB — CBC WITH DIFFERENTIAL/PLATELET
Abs Immature Granulocytes: 0.1 10*3/uL (ref 0.0–0.1)
BASOS PCT: 0 %
Basophils Absolute: 0 10*3/uL (ref 0.0–0.1)
Eosinophils Absolute: 0.2 10*3/uL (ref 0.0–0.7)
Eosinophils Relative: 3 %
HCT: 31.7 % — ABNORMAL LOW (ref 36.0–46.0)
Hemoglobin: 9.2 g/dL — ABNORMAL LOW (ref 12.0–15.0)
Immature Granulocytes: 1 %
LYMPHS ABS: 2.1 10*3/uL (ref 0.7–4.0)
Lymphocytes Relative: 32 %
MCH: 26.1 pg (ref 26.0–34.0)
MCHC: 29 g/dL — AB (ref 30.0–36.0)
MCV: 90.1 fL (ref 78.0–100.0)
MONO ABS: 0.7 10*3/uL (ref 0.1–1.0)
MONOS PCT: 11 %
NEUTROS ABS: 3.5 10*3/uL (ref 1.7–7.7)
Neutrophils Relative %: 53 %
PLATELETS: 277 10*3/uL (ref 150–400)
RBC: 3.52 MIL/uL — ABNORMAL LOW (ref 3.87–5.11)
RDW: 16.2 % — AB (ref 11.5–15.5)
WBC: 6.6 10*3/uL (ref 4.0–10.5)

## 2018-03-28 LAB — GLUCOSE, CAPILLARY
GLUCOSE-CAPILLARY: 215 mg/dL — AB (ref 70–99)
Glucose-Capillary: 140 mg/dL — ABNORMAL HIGH (ref 70–99)
Glucose-Capillary: 143 mg/dL — ABNORMAL HIGH (ref 70–99)

## 2018-03-28 MED ORDER — CEFDINIR 300 MG PO CAPS: 300.0000 mg | ORAL_CAPSULE | Freq: Two times a day (BID) | ORAL | 0 refills | Status: DC

## 2018-03-28 MED ORDER — SENNOSIDES-DOCUSATE SODIUM 8.6-50 MG PO TABS
3.0000 | ORAL_TABLET | Freq: Two times a day (BID) | ORAL | Status: DC
  Administered 2018-03-28: 3 via ORAL
  Filled 2018-03-28: qty 3

## 2018-03-28 MED ORDER — LEVALBUTEROL HCL 0.63 MG/3ML IN NEBU
0.6300 mg | INHALATION_SOLUTION | Freq: Three times a day (TID) | RESPIRATORY_TRACT | Status: DC
  Filled 2018-03-28: qty 3

## 2018-03-28 MED ORDER — HYDROCODONE-ACETAMINOPHEN 5-325 MG PO TABS: 2.0000 | ORAL_TABLET | Freq: Four times a day (QID) | ORAL | 0 refills | Status: DC | PRN

## 2018-03-28 MED ORDER — FLUCONAZOLE 150 MG PO TABS: 150.0000 mg | ORAL_TABLET | Freq: Every day | ORAL | 0 refills | Status: DC

## 2018-03-28 MED ORDER — GERHARDT'S BUTT CREAM
TOPICAL_CREAM | Freq: Four times a day (QID) | CUTANEOUS | Status: DC
  Administered 2018-03-28 (×2): via TOPICAL
  Filled 2018-03-28: qty 1

## 2018-03-28 MED ORDER — IPRATROPIUM BROMIDE 0.02 % IN SOLN
0.5000 mg | Freq: Three times a day (TID) | RESPIRATORY_TRACT | Status: DC
  Filled 2018-03-28: qty 2.5

## 2018-03-28 NOTE — Consult Note (Signed)
   [facility] CM Inpatient Consult   03/28/2018  Christy May 1950/07/23 [id number]  Patient is active with [place] [facility] with [facility] CSW  prior to readmission.  CSW visit with patient in facility of [facility]. Patient is with Medicare ACO.  Current chart review reveals patient is to return to SNF.  Patient will receive a post discharge transition of care call and will be evaluated for monthly home visits for assessments and disease process education. Met with the patient and daughter, [name] at the bedside.  Patient had some difficulty with time.  She said she was coming to the hospital from home and she told her daughter she hadn't seen in three days. Current plan is for the patient to return to skilled nursing per inpatient RNCM.   Made Inpatient Case Manager aware that [facility] following. Of note, [facility] Care Management services does not replace or interfere with any services that are needed or arranged by inpatient case management or social work.  For additional questions or referrals please contact:  Natividad Brood, RN BSN [facility] Liaison  [phone] business mobile phone Toll free office [phone]

## 2018-03-28 NOTE — Consult Note (Signed)
[facility] Nurse wound consult note Reason for Consult: Moisture lesions.  There are no pressure ulcers/injuries noted. Wound type: Moisture Associated Skin Damage (MASD), specifically Incontinence associated dermatitis (IAD) and intertriginous dermatitis (ITD) in the sub pannicular, bilateral inguinal and inframammary areas. She was last seen by this writer in August of this year with these issues. Pressure Injury POA: NA Measurement: scattered full and partial thickness open areas that were present at last admission.  Largest lesions on left posterior thigh measures 1.2cm round and 0.4cm deep with pink and yellow wound bed and scant serous drainage. Wound bed:See above Drainage (amount, consistency, odor) See above Periwound: macerated from urine, despite use of external female urinary collection device (PurWick). Dressing procedure/placement/frequency: Patient is on a mattress replacement with low air loss feature for management of microclimate. She is not well motivated to turn and reposition and it is difficult for her to do so; I have asked Nursing for assistance in these maneuvers. Topical care guidence is provided using a compounded, prescription cream consisting of antifungal (Lotrimin), moisture barrier (Zinc Oxide) and hydrocortisone (Gerhart's Butt Cream) four times daily.  [name] will not follow, but will remain available to this patient, the nursing and medical teams.  Please re-consult if needed. Thanks, Maudie Flakes, MSN, RN, [place], Arther Abbott  Pager# [phone]

## 2018-03-28 NOTE — Clinical Social Work Note (Signed)
Clinical Social Work Assessment  Patient Details  Name: Christy May MRN: [id number] Date of Birth: 12-Mar-1951  Date of referral:  03/28/18               Reason for consult:  Facility Placement                Permission sought to share information with:  Family Supports Permission granted to share information::  Yes, Verbal Permission Granted  Name::     Christy May  Agency::     Relationship::  Daughter  Contact Information:  [phone]  Housing/Transportation Living arrangements for the past 2 months:  Deport of Information:  Patient, Adult Children(Daughter [place]) Patient Interpreter Needed:  None Criminal Activity/Legal Involvement Pertinent to Current Situation/Hospitalization:  No - Comment as needed Significant Relationships:  Adult Children Lives with:  Self Do you feel safe going back to the place where you live?  No(Patient agreeable to return to [facility] place to continue rehab) Need for family participation in patient care:  Yes (Comment)  Care giving concerns:  Patient reported no concerns regarding her care at [facility].  Social Worker assessment / plan:  CSW talked with patient at the bedside regarding her discharge plan and confirmed return to [facility]. [name] was sitting up in bed and was alert, oriented and engaged in conversation with CSW. When asked, patient responded that she lives alone and [name] is her only child and lives in [place].  Employment status:  Retired Forensic scientist:  Information systems manager, Medicaid In [place] PT Recommendations:  Not assessed at this time [place] / Referral to community resources:  Other (Comment Required)(None needed or requested as patient returning to [facility])  Patient/Family's Response to care:  Patient requested to speak with MD and he was contacted by CSW.  Patient/Family's Understanding of and Emotional Response to Diagnosis, Current Treatment, and Prognosis: Patient  expressed understanding regarding her readiness for discharge, but had questions for the doctor.  Emotional Assessment Appearance:  Appears stated age Attitude/Demeanor/Rapport:  Engaged Affect (typically observed):  Appropriate Orientation:  Oriented to Self, Oriented to Place, Oriented to Situation Alcohol / Substance use:  Tobacco Use, Alcohol Use, Illicit Drugs(Patient reported that she quit smoking and drinks alcohol and does not use illicit drugs) Psych involvement (Current and /or in the community):  No (Comment)  Discharge Needs  Concerns to be addressed:  Discharge Planning Concerns Readmission within the last 30 days:  No Current discharge risk:  None Barriers to Discharge:  No Barriers Identified   Sable Feil, LCSW 03/28/2018, 6:36 PM

## 2018-03-28 NOTE — Discharge Summary (Signed)
Physician Discharge Summary  Christy May KZS:[id number] DOB: [birth year] DOA: 03/25/2018  PCP: Lucianne Lei, MD  Admit date: 03/25/2018 Discharge date: 03/28/2018  Admitted From: SNF Disposition:  SNF  Recommendations for Outpatient Follow-up:  1. Follow up with PCP in 1-2 weeks 2. Continue Cefdinir for 4 additional days 3. Continue Diflucan for 4 additional days  Home Health: none Equipment/Devices: none  Discharge Condition: stable CODE STATUS: Full code Diet recommendation: low calorie   HPI: Per Dr. Olevia Bowens, Christy May is a 67 y.o. female with medical history significant of allergy, anemia, anxiety, depression, osteoarthritis, chronic back pain, colon polyps, dehydration, diabetic peripheral neuropathy, dysphagia, esophageal reflux, heart murmur, history of hiatal hernia, migraine headaches, OSA on CPAP, history of septic shock, type 2 diabetes, hypertension, vitamin D deficiency who is sent from her nursing facility due to fever associated with generalized weakness, mild dyspnea and nausea for the past 4 days.  She denies headache, sore throat, hemoptysis.  No chest pain, palpitations, dizziness, diaphoresis, PND or orthopnea.  She occasionally has lower extremity edema  Hospital Course: Sepsis due to Healthcare associated pneumonia causing acute metabolic encephalopathy -patient was admitted to the hospital and placed on broad-spectrum antibiotics with vancomycin and cefepime.  Clinical condition improved, she became afebrile, has no leukocytosis, and will be transition to p.o. cefdinir and she is to complete 4 additional days.  Cultures obtained are negative at the time of discharge.  Encephalopathy has resolved. Sinus tachycardia -on admission, likely due to rebound tachycardia, once her home metoprolol was restarted her heart rate has normalized. Pressure injury of the buttocks/fungal skin infection -Continue Lotrimin, nystatin, and fluconazole p.o. x7 days. Continue  local wound care Hypertension -resume home medications on discharge OSA -Continue CPAP Diabetes mellitus type 2 in obese ([place]) -resume home medications on discharge DM neuropathy, type II diabetes mellitus ([place]) -Continue gabapentin  Elevated INR -Unsure if the patient was started on [facility] at the [facility]. No med reconciliation sheet from NH available. DVT suspicion on 09/25, but negative doppler US. See ER notes. Per nursing home  RUQ pain -did complain at one point of RUQ pain, [place] negative for acute findings. LFTs are normal, she is able to eat and drink without nausea/vomiting. Resolved  Discharge Diagnoses:  Principal Problem:   HCAP (healthcare-associated pneumonia) Active Problems:   Essential hypertension   GERD   Sleep apnea   Gout   Pressure injury of skin   Diabetes mellitus type 2 in obese (HCC)   DM neuropathy, type II diabetes mellitus ([place])   Fungal skin infection     Discharge Instructions   Allergies as of 03/28/2018      Reactions   Ace Inhibitors Anaphylaxis, Cough   Tolerates Irbesartan (home med)   Penicillins Hives, Other (See Comments)   Tolerated ceftriaxone in 2019 Has patient had a PCN reaction causing immediate rash, facial/tongue/throat swelling, SOB or lightheadedness with hypotension: Yes Has patient had a PCN reaction causing severe rash involving mucus membranes or skin necrosis:  No Has patient had a PCN reaction that required hospitalization: No Has patient had a PCN reaction occurring within the last 10 years: No If all of the above answers are "NO", then may proceed with Cephalosporin use.   Adhesive [tape] Hives, Rash      Medication List    STOP taking these medications   Gerhardt's butt cream Crea   insulin NPH Human 100 UNIT/ML injection Commonly known as:  HUMULIN N,NOVOLIN N   oxyCODONE 5 MG  immediate release tablet Commonly known as:  Oxy IR/ROXICODONE     TAKE these medications   allopurinol 300 MG tablet Commonly known as:   ZYLOPRIM Take 300 mg by mouth daily.   bisacodyl 10 MG suppository Commonly known as:  DULCOLAX Place 1 suppository (10 mg total) rectally daily as needed for moderate constipation.   cefdinir 300 MG capsule Commonly known as:  OMNICEF Take 1 capsule (300 mg total) by mouth 2 (two) times daily.   cyclobenzaprine 10 MG tablet Commonly known as:  FLEXERIL Take 1 tablet (10 mg total) by mouth 3 (three) times daily as needed for muscle spasms. What changed:  when to take this   cycloSPORINE 0.05 % ophthalmic emulsion Commonly known as:  RESTASIS Place 1 drop into both eyes 2 (two) times daily.   docusate sodium 100 MG capsule Commonly known as:  COLACE Take 1 capsule (100 mg total) by mouth 2 (two) times daily.   fluconazole 150 MG tablet Commonly known as:  DIFLUCAN Take 1 tablet (150 mg total) by mouth daily. Start taking on:  03/29/2018   fluticasone 50 MCG/ACT nasal spray Commonly known as:  FLONASE SHAKE LIQUID AND USE 2 SPRAYS IN EACH NOSTRIL DAILY What changed:  See the new instructions.   folic acid 1 MG tablet Commonly known as:  FOLVITE Take 1 mg by mouth once a day   gabapentin 800 MG tablet Commonly known as:  NEURONTIN Take 800 mg by mouth 3 (three) times daily.   HYDROcodone-acetaminophen 5-325 MG tablet Commonly known as:  NORCO/VICODIN Take 2 tablets by mouth every 6 (six) hours as needed (pain).   insulin aspart 100 UNIT/ML injection Commonly known as:  novoLOG Inject 2-10 Units into the skin See admin instructions. With meals. Per sliding scale. If CBG is 201-250= 2 units 251-300= 4 units 301-350= 6 units 351-400= 8 units 401-450= 10 units. If >450 call MD.   insulin glargine 100 UNIT/ML injection Commonly known as:  LANTUS Inject 35 Units into the skin 2 (two) times daily.   INSULIN SYRINGE 1CC/31GX5/16" 31G X 5/16" 1 ML Misc USE TO INJECT INSULIN TWICE DAILY   ipratropium 0.03 % nasal spray Commonly known as:  ATROVENT USE 2 SPRAYS IN  EACH NOSTRIL THREE TIMES DAILY AS NEEDED FOR RHINITIS What changed:  See the new instructions.   irbesartan 300 MG tablet Commonly known as:  AVAPRO Take 300 mg by mouth daily.   lidocaine 5 % Commonly known as:  LIDODERM Place 1 patch onto the skin daily. Remove & Discard patch within 12 hours or as directed by MD   loratadine 10 MG tablet Commonly known as:  CLARITIN Take 10 mg by mouth daily.   meclizine 25 MG tablet Commonly known as:  ANTIVERT Take 1 tablet (25 mg total) by mouth 3 (three) times daily as needed for dizziness.   metFORMIN 500 MG tablet Commonly known as:  GLUCOPHAGE TAKE 1 TABLET(500 MG) BY MOUTH TWICE DAILY WITH A MEAL What changed:  See the new instructions.   metoprolol tartrate 100 MG tablet Commonly known as:  LOPRESSOR Take 100 mg by mouth 2 (two) times daily.   montelukast 10 MG tablet Commonly known as:  SINGULAIR TAKE 1 TABLET(10 MG) BY MOUTH DAILY What changed:  See the new instructions.   multivitamin with minerals tablet Take 1 tablet by mouth daily.   nystatin cream Commonly known as:  MYCOSTATIN Apply 1 application topically 2 (two) times daily as needed for dry skin (skin irritation).  ondansetron 8 MG disintegrating tablet Commonly known as:  ZOFRAN-ODT Take 1 tablet (8 mg total) by mouth every 8 (eight) hours as needed for nausea or vomiting.   ONE TOUCH LANCETS Misc Use to check sugars twice daily DX E11.22   oxybutynin 10 MG 24 hr tablet Commonly known as:  DITROPAN-XL Take 1 tablet (10 mg total) by mouth daily.   pantoprazole 40 MG tablet Commonly known as:  PROTONIX TAKE 1 TABLET(40 MG) BY MOUTH TWICE DAILY What changed:  See the new instructions.   polyethylene glycol packet Commonly known as:  MIRALAX / GLYCOLAX Take 17 g by mouth 2 (two) times daily.   predniSONE 10 MG tablet Commonly known as:  DELTASONE Take 20 mg by mouth daily with breakfast. What changed:  Another medication with the same name was  removed. Continue taking this medication, and follow the directions you see here.   ranitidine 300 MG tablet Commonly known as:  ZANTAC Take 1 tablet (300 mg total) by mouth 2 (two) times daily.   senna 8.6 MG Tabs tablet Commonly known as:  SENOKOT Take 1 tablet (8.6 mg total) by mouth daily.   sodium chloride 0.65 % Soln nasal spray Commonly known as:  OCEAN Place 2 sprays into both nostrils 2 (two) times daily.   Vitamin D-3 1000 units Caps Take 2,000 Units by mouth daily.      Follow-up Information    Lucianne Lei, MD. Schedule an appointment as soon as possible for a visit in 3 week(s).   Specialty:  Family Medicine Contact information: [address] [place] [address] [phone]           Consultations:  None   Procedures/Studies:  Dg Chest 2 View  Result Date: 03/25/2018 CLINICAL DATA:  Sepsis.  Weakness. EXAM: CHEST - 2 VIEW COMPARISON:  02/17/2018. FINDINGS: Mediastinum hilar structures normal. Motion artifact. Low lung volumes with bibasilar atelectasis/infiltrates. Heart size stable. No pulmonary venous congestion. Prior cervicothoracic spine fusion. IMPRESSION: Low lung volumes with mild bibasilar atelectasis/infiltrates. Electronically Signed   By: Bynum   On: 03/25/2018 12:12   US Abdomen Limited Ruq  Result Date: 03/28/2018 CLINICAL DATA:  Abdominal pain.  Previous cholecystectomy. EXAM: ULTRASOUND ABDOMEN LIMITED RIGHT UPPER QUADRANT COMPARISON:  Abdominal CT scan of August 30, 2017 FINDINGS: Gallbladder: The gallbladder is surgically absent. Common bile duct: Diameter: 10 mm Liver: The hepatic echotexture is normal. The surface contour is smooth. There is no focal mass nor ductal dilation. Portal vein is patent on color Doppler imaging with normal direction of blood flow towards the liver. IMPRESSION: Previous cholecystectomy. Normal appearance of the liver. Normally expected dilation of the common bile duct post cholecystectomy.  Electronically Signed   By: David  Martinique M.D.   On: 03/28/2018 10:02     Subjective: - no chest pain, shortness of breath, no abdominal pain, nausea or vomiting.   Discharge Exam: Vitals:   03/28/18 1125 03/28/18 1313  BP: 107/67 120/76  Pulse: 86 93  Resp:    Temp:  98.6 F (37 C)  SpO2:  95%    General: Pt is alert, awake, not in acute distress Cardiovascular: RRR, S1/S2 +, no rubs, no gallops Respiratory: CTA bilaterally, no wheezing, no rhonchi Abdominal: Soft, NT, ND, bowel sounds + Extremities: no edema, no cyanosis    The results of significant diagnostics from this hospitalization (including imaging, microbiology, ancillary and laboratory) are listed below for reference.     Microbiology: Recent Results (from the  past 240 hour(s))  Culture, blood (Routine x 2)     Status: None (Preliminary result)   Collection Time: 03/25/18 10:55 AM  Result Value Ref Range Status   Specimen Description BLOOD LEFT ANTECUBITAL  Final   Special Requests   Final    BOTTLES DRAWN AEROBIC AND ANAEROBIC Blood Culture results may not be optimal due to an inadequate volume of blood received in culture bottles   Culture   Final    NO GROWTH 3 DAYS Performed at [facility] [address], [place], [place] [address]    Report Status PENDING  Incomplete  Culture, blood (Routine x 2)     Status: None (Preliminary result)   Collection Time: 03/25/18 10:57 AM  Result Value Ref Range Status   Specimen Description BLOOD BLOOD RIGHT HAND  Final   Special Requests   Final    BOTTLES DRAWN AEROBIC AND ANAEROBIC Blood Culture results may not be optimal due to an inadequate volume of blood received in culture bottles   Culture   Final    NO GROWTH 3 DAYS Performed at [facility], [place] [address] [place], [place] [address]    Report Status PENDING  Incomplete  Urine culture     Status: Abnormal   Collection Time: 03/25/18 11:34 AM  Result Value Ref Range Status   Specimen  Description URINE, CATHETERIZED  Final   Special Requests NONE  Final   Culture (A)  Final    <10,000 COLONIES/mL INSIGNIFICANT GROWTH Performed at [facility] [address], [place], [place] [address]    Report Status 03/26/2018 FINAL  Final  MRSA PCR Screening     Status: Abnormal   Collection Time: 03/26/18  1:00 AM  Result Value Ref Range Status   MRSA by PCR POSITIVE (A) NEGATIVE Final    Comment:        The GeneXpert MRSA Assay (FDA approved for NASAL specimens only), is one component of a comprehensive MRSA colonization surveillance program. It is not intended to diagnose MRSA infection nor to guide or monitor treatment for MRSA infections. CRITICAL RESULT CALLED TO, READ BACK BY AND VERIFIED WITH: CALLED C. ROBINSON @ [id number] ON 03/26/18 BY ROBINSON Z.       Labs: BNP (last 3 results) No results for input(s): BNP in the last 8760 hours. Basic Metabolic Panel: Recent Labs  Lab 03/25/18 1057 03/26/18 0247 03/27/18 0402  NA 136 137 140  K 4.5 4.8 4.1  CL 97* 104 105  CO2 25 21* 24  GLUCOSE 235* 183* 108*  BUN 21 13 9   CREATININE 1.26* 0.94 0.92  CALCIUM 9.7 8.6* 8.9  MG 1.7  --   --    Liver Function Tests: Recent Labs  Lab 03/25/18 1057 03/26/18 0247 03/27/18 0402  AST 17 20 17   ALT 22 19 23   ALKPHOS 78 65 63  BILITOT 0.4 1.3* 0.5  PROT 7.1 6.0* 5.7*  ALBUMIN 2.4* 2.1* 1.9*   No results for input(s): LIPASE, AMYLASE in the last 168 hours. No results for input(s): AMMONIA in the last 168 hours. CBC: Recent Labs  Lab 03/25/18 1057 03/26/18 0247 03/27/18 0402 03/28/18 0320  WBC 9.4 7.9 7.5 6.6  NEUTROABS 6.4 5.4 3.9 3.5  HGB 9.9* 9.0* 8.6* 9.2*  HCT 33.5* 29.5* 30.2* 31.7*  MCV 88.2 88.1 91.2 90.1  PLT 335 256 265 277   Cardiac Enzymes: No results for input(s): CKTOTAL, CKMB, CKMBINDEX, TROPONINI in the last 168  hours. BNP: Invalid input(s): POCBNP CBG: Recent Labs  Lab 03/27/18 0814 03/27/18 1228 03/27/18 2028 03/28/18 0813  03/28/18 1208  GLUCAP 133* 113* 157* 140* 215*   D-Dimer No results for input(s): DDIMER in the last 72 hours. Hgb A1c No results for input(s): HGBA1C in the last 72 hours. Lipid Profile No results for input(s): CHOL, HDL, LDLCALC, TRIG, CHOLHDL, LDLDIRECT in the last 72 hours. Thyroid function studies No results for input(s): TSH, T4TOTAL, T3FREE, THYROIDAB in the last 72 hours.  Invalid input(s): FREET3 Anemia work up No results for input(s): VITAMINB12, FOLATE, FERRITIN, TIBC, IRON, RETICCTPCT in the last 72 hours. Urinalysis    Component Value Date/Time   COLORURINE YELLOW 03/25/2018 1134   APPEARANCEUR HAZY (A) 03/25/2018 1134   LABSPEC 1.017 03/25/2018 1134   PHURINE 5.0 03/25/2018 1134   GLUCOSEU 50 (A) 03/25/2018 1134   GLUCOSEU NEGATIVE 12/14/2011 1707   HGBUR NEGATIVE 03/25/2018 1134   BILIRUBINUR NEGATIVE 03/25/2018 1134   BILIRUBINUR n 11/16/2014 1515   KETONESUR NEGATIVE 03/25/2018 1134   PROTEINUR NEGATIVE 03/25/2018 1134   UROBILINOGEN 0.2 02/27/2015 2226   NITRITE NEGATIVE 03/25/2018 1134   LEUKOCYTESUR NEGATIVE 03/25/2018 1134   Sepsis Labs Invalid input(s): PROCALCITONIN,  WBC,  LACTICIDVEN   Time coordinating discharge: 35 minutes  SIGNED:  Marzetta Board, MD  [facility] 03/28/2018, 1:46 PM Pager [phone]  If 7PM-7AM, please contact night-coverage [email or web address] Password [email or web address]

## 2018-03-28 NOTE — Progress Notes (Signed)
Patient discharged to back to [facility]. Attempted to call and give report X3; unsuccessful. No answer. Patient/family notified of discharge and all questions/concerns addressed. Patient transferred via [place].

## 2018-03-28 NOTE — Clinical Social Work Note (Signed)
Patient discharged back to [facility] today, transported by ambulance. Facility admissions director advised of patient's readiness for discharge and d/c clinicals transmitted to SNF. Nurse provided with information to call report and patient advised after transport called. CSW signing off as no other SW intervention services needed.  Tyree Fluharty Givens, MSW, LCSW Licensed Clinical Social Worker [facility] [phone]

## 2018-03-28 NOTE — Progress Notes (Signed)
Upon entering the room, it was noted that the patient was not wearing her CPAP.  Continuous pulse ox was giving a reading between 65-70%.  Applied 2L nasal cannula, pulse ox continued to read 65-70%.  Placed a new oxygen sensor on the patient's finger, and received a new reading of 99%.  Nasal cannula removed, pt is now maintaining saturation of 95-96% on room air.  Will continue to monitor.

## 2018-03-29 DIAGNOSIS — R000 Tachycardia, unspecified: Secondary | ICD-10-CM | POA: Diagnosis not present

## 2018-03-29 DIAGNOSIS — J189 Pneumonia, unspecified organism: Secondary | ICD-10-CM | POA: Diagnosis not present

## 2018-03-29 DIAGNOSIS — E1140 Type 2 diabetes mellitus with diabetic neuropathy, unspecified: Secondary | ICD-10-CM | POA: Diagnosis not present

## 2018-03-29 DIAGNOSIS — G8220 Paraplegia, unspecified: Secondary | ICD-10-CM | POA: Diagnosis not present

## 2018-03-29 DIAGNOSIS — M4323 Fusion of spine, cervicothoracic region: Secondary | ICD-10-CM | POA: Diagnosis not present

## 2018-03-29 DIAGNOSIS — M6281 Muscle weakness (generalized): Secondary | ICD-10-CM | POA: Diagnosis not present

## 2018-03-29 DIAGNOSIS — I10 Essential (primary) hypertension: Secondary | ICD-10-CM | POA: Diagnosis not present

## 2018-03-29 DIAGNOSIS — R41841 Cognitive communication deficit: Secondary | ICD-10-CM | POA: Diagnosis not present

## 2018-03-29 DIAGNOSIS — M4804 Spinal stenosis, thoracic region: Secondary | ICD-10-CM | POA: Diagnosis not present

## 2018-03-29 DIAGNOSIS — M4714 Other spondylosis with myelopathy, thoracic region: Secondary | ICD-10-CM | POA: Diagnosis not present

## 2018-03-29 DIAGNOSIS — R55 Syncope and collapse: Secondary | ICD-10-CM | POA: Diagnosis not present

## 2018-03-29 DIAGNOSIS — M542 Cervicalgia: Secondary | ICD-10-CM | POA: Diagnosis not present

## 2018-03-29 DIAGNOSIS — R1312 Dysphagia, oropharyngeal phase: Secondary | ICD-10-CM | POA: Diagnosis not present

## 2018-03-30 DIAGNOSIS — R41841 Cognitive communication deficit: Secondary | ICD-10-CM | POA: Diagnosis not present

## 2018-03-30 DIAGNOSIS — R55 Syncope and collapse: Secondary | ICD-10-CM | POA: Diagnosis not present

## 2018-03-30 DIAGNOSIS — R1312 Dysphagia, oropharyngeal phase: Secondary | ICD-10-CM | POA: Diagnosis not present

## 2018-03-30 DIAGNOSIS — M4323 Fusion of spine, cervicothoracic region: Secondary | ICD-10-CM | POA: Diagnosis not present

## 2018-03-30 DIAGNOSIS — M6281 Muscle weakness (generalized): Secondary | ICD-10-CM | POA: Diagnosis not present

## 2018-03-30 LAB — CULTURE, BLOOD (ROUTINE X 2)
CULTURE: NO GROWTH
CULTURE: NO GROWTH

## 2018-03-31 DIAGNOSIS — R1312 Dysphagia, oropharyngeal phase: Secondary | ICD-10-CM | POA: Diagnosis not present

## 2018-03-31 DIAGNOSIS — R41841 Cognitive communication deficit: Secondary | ICD-10-CM | POA: Diagnosis not present

## 2018-03-31 DIAGNOSIS — M6281 Muscle weakness (generalized): Secondary | ICD-10-CM | POA: Diagnosis not present

## 2018-03-31 DIAGNOSIS — R55 Syncope and collapse: Secondary | ICD-10-CM | POA: Diagnosis not present

## 2018-03-31 DIAGNOSIS — M4323 Fusion of spine, cervicothoracic region: Secondary | ICD-10-CM | POA: Diagnosis not present

## 2018-04-01 DIAGNOSIS — E1165 Type 2 diabetes mellitus with hyperglycemia: Secondary | ICD-10-CM | POA: Diagnosis not present

## 2018-04-01 DIAGNOSIS — R52 Pain, unspecified: Secondary | ICD-10-CM | POA: Diagnosis not present

## 2018-04-01 DIAGNOSIS — M797 Fibromyalgia: Secondary | ICD-10-CM | POA: Diagnosis not present

## 2018-04-01 DIAGNOSIS — R55 Syncope and collapse: Secondary | ICD-10-CM | POA: Diagnosis not present

## 2018-04-01 DIAGNOSIS — R41841 Cognitive communication deficit: Secondary | ICD-10-CM | POA: Diagnosis not present

## 2018-04-01 DIAGNOSIS — M6281 Muscle weakness (generalized): Secondary | ICD-10-CM | POA: Diagnosis not present

## 2018-04-01 DIAGNOSIS — M4323 Fusion of spine, cervicothoracic region: Secondary | ICD-10-CM | POA: Diagnosis not present

## 2018-04-01 DIAGNOSIS — R1312 Dysphagia, oropharyngeal phase: Secondary | ICD-10-CM | POA: Diagnosis not present

## 2018-04-02 DIAGNOSIS — R41841 Cognitive communication deficit: Secondary | ICD-10-CM | POA: Diagnosis not present

## 2018-04-02 DIAGNOSIS — R55 Syncope and collapse: Secondary | ICD-10-CM | POA: Diagnosis not present

## 2018-04-02 DIAGNOSIS — M6281 Muscle weakness (generalized): Secondary | ICD-10-CM | POA: Diagnosis not present

## 2018-04-02 DIAGNOSIS — M4323 Fusion of spine, cervicothoracic region: Secondary | ICD-10-CM | POA: Diagnosis not present

## 2018-04-02 DIAGNOSIS — R1312 Dysphagia, oropharyngeal phase: Secondary | ICD-10-CM | POA: Diagnosis not present

## 2018-04-04 DIAGNOSIS — E1142 Type 2 diabetes mellitus with diabetic polyneuropathy: Secondary | ICD-10-CM | POA: Diagnosis not present

## 2018-04-04 DIAGNOSIS — R1312 Dysphagia, oropharyngeal phase: Secondary | ICD-10-CM | POA: Diagnosis not present

## 2018-04-04 DIAGNOSIS — R262 Difficulty in walking, not elsewhere classified: Secondary | ICD-10-CM | POA: Diagnosis not present

## 2018-04-04 DIAGNOSIS — E1159 Type 2 diabetes mellitus with other circulatory complications: Secondary | ICD-10-CM | POA: Diagnosis not present

## 2018-04-04 DIAGNOSIS — Z794 Long term (current) use of insulin: Secondary | ICD-10-CM | POA: Diagnosis not present

## 2018-04-04 DIAGNOSIS — E1165 Type 2 diabetes mellitus with hyperglycemia: Secondary | ICD-10-CM | POA: Diagnosis not present

## 2018-04-04 DIAGNOSIS — R55 Syncope and collapse: Secondary | ICD-10-CM | POA: Diagnosis not present

## 2018-04-04 DIAGNOSIS — I10 Essential (primary) hypertension: Secondary | ICD-10-CM | POA: Diagnosis not present

## 2018-04-04 DIAGNOSIS — R41841 Cognitive communication deficit: Secondary | ICD-10-CM | POA: Diagnosis not present

## 2018-04-04 DIAGNOSIS — M6281 Muscle weakness (generalized): Secondary | ICD-10-CM | POA: Diagnosis not present

## 2018-04-04 DIAGNOSIS — Z9889 Other specified postprocedural states: Secondary | ICD-10-CM | POA: Diagnosis not present

## 2018-04-04 DIAGNOSIS — M4323 Fusion of spine, cervicothoracic region: Secondary | ICD-10-CM | POA: Diagnosis not present

## 2018-04-05 DIAGNOSIS — R1312 Dysphagia, oropharyngeal phase: Secondary | ICD-10-CM | POA: Diagnosis not present

## 2018-04-05 DIAGNOSIS — M6281 Muscle weakness (generalized): Secondary | ICD-10-CM | POA: Diagnosis not present

## 2018-04-05 DIAGNOSIS — R55 Syncope and collapse: Secondary | ICD-10-CM | POA: Diagnosis not present

## 2018-04-05 DIAGNOSIS — M4323 Fusion of spine, cervicothoracic region: Secondary | ICD-10-CM | POA: Diagnosis not present

## 2018-04-05 DIAGNOSIS — R41841 Cognitive communication deficit: Secondary | ICD-10-CM | POA: Diagnosis not present

## 2018-04-06 DIAGNOSIS — R55 Syncope and collapse: Secondary | ICD-10-CM | POA: Diagnosis not present

## 2018-04-06 DIAGNOSIS — M6281 Muscle weakness (generalized): Secondary | ICD-10-CM | POA: Diagnosis not present

## 2018-04-06 DIAGNOSIS — R41841 Cognitive communication deficit: Secondary | ICD-10-CM | POA: Diagnosis not present

## 2018-04-06 DIAGNOSIS — R1312 Dysphagia, oropharyngeal phase: Secondary | ICD-10-CM | POA: Diagnosis not present

## 2018-04-06 DIAGNOSIS — M4323 Fusion of spine, cervicothoracic region: Secondary | ICD-10-CM | POA: Diagnosis not present

## 2018-04-07 ENCOUNTER — Other Ambulatory Visit: Payer: Self-pay | Admitting: Licensed Clinical Social Worker

## 2018-04-07 DIAGNOSIS — R55 Syncope and collapse: Secondary | ICD-10-CM | POA: Diagnosis not present

## 2018-04-07 DIAGNOSIS — M4323 Fusion of spine, cervicothoracic region: Secondary | ICD-10-CM | POA: Diagnosis not present

## 2018-04-07 DIAGNOSIS — R1312 Dysphagia, oropharyngeal phase: Secondary | ICD-10-CM | POA: Diagnosis not present

## 2018-04-07 DIAGNOSIS — M6281 Muscle weakness (generalized): Secondary | ICD-10-CM | POA: Diagnosis not present

## 2018-04-07 DIAGNOSIS — R41841 Cognitive communication deficit: Secondary | ICD-10-CM | POA: Diagnosis not present

## 2018-04-07 NOTE — Patient Outreach (Signed)
Assessment:  CSW spoke via phone with client on 04/07/18. CSW verified client identity. CSW received verbal permission from client for CSW to speak with client about client needs. Client continues to receive nursing care and physical therapy support at [facility] in [place], [place]. Client had been hospitalized recently at [facility]. She discharged from the hospital and returned to [facility] facility to receive care.  She is receiving nursing care and physical therapy support at [facility] facility.  CSW has encouraged client to communicate with facility social worker to develop client discharge plan. Client had previously lived alone at her home [place] the community .  Client receives some support from her daughter.  However, her daughter does live in [place], [place]. Client said she had been eating adequately. Client said nursing staff is providing skin care as needed for client. CSW thanked client for phone call with CSW on 04/07/18.    Plan:  CSW to call client in 3 weeks to assess client needs at that time.  [name].Maliq Pilley MSW, LCSW Licensed Clinical Social Worker [facility] Care Management [phone]

## 2018-04-08 DIAGNOSIS — M4323 Fusion of spine, cervicothoracic region: Secondary | ICD-10-CM | POA: Diagnosis not present

## 2018-04-08 DIAGNOSIS — R55 Syncope and collapse: Secondary | ICD-10-CM | POA: Diagnosis not present

## 2018-04-08 DIAGNOSIS — R1312 Dysphagia, oropharyngeal phase: Secondary | ICD-10-CM | POA: Diagnosis not present

## 2018-04-08 DIAGNOSIS — R41841 Cognitive communication deficit: Secondary | ICD-10-CM | POA: Diagnosis not present

## 2018-04-08 DIAGNOSIS — M6281 Muscle weakness (generalized): Secondary | ICD-10-CM | POA: Diagnosis not present

## 2018-04-11 DIAGNOSIS — E1165 Type 2 diabetes mellitus with hyperglycemia: Secondary | ICD-10-CM | POA: Diagnosis not present

## 2018-04-11 DIAGNOSIS — Z794 Long term (current) use of insulin: Secondary | ICD-10-CM | POA: Diagnosis not present

## 2018-04-11 DIAGNOSIS — M6281 Muscle weakness (generalized): Secondary | ICD-10-CM | POA: Diagnosis not present

## 2018-04-11 DIAGNOSIS — E1142 Type 2 diabetes mellitus with diabetic polyneuropathy: Secondary | ICD-10-CM | POA: Diagnosis not present

## 2018-04-11 DIAGNOSIS — R55 Syncope and collapse: Secondary | ICD-10-CM | POA: Diagnosis not present

## 2018-04-11 DIAGNOSIS — R41841 Cognitive communication deficit: Secondary | ICD-10-CM | POA: Diagnosis not present

## 2018-04-11 DIAGNOSIS — R1312 Dysphagia, oropharyngeal phase: Secondary | ICD-10-CM | POA: Diagnosis not present

## 2018-04-11 DIAGNOSIS — M4323 Fusion of spine, cervicothoracic region: Secondary | ICD-10-CM | POA: Diagnosis not present

## 2018-04-11 DIAGNOSIS — E1159 Type 2 diabetes mellitus with other circulatory complications: Secondary | ICD-10-CM | POA: Diagnosis not present

## 2018-04-11 DIAGNOSIS — I10 Essential (primary) hypertension: Secondary | ICD-10-CM | POA: Diagnosis not present

## 2018-04-12 DIAGNOSIS — M4323 Fusion of spine, cervicothoracic region: Secondary | ICD-10-CM | POA: Diagnosis not present

## 2018-04-12 DIAGNOSIS — R41841 Cognitive communication deficit: Secondary | ICD-10-CM | POA: Diagnosis not present

## 2018-04-12 DIAGNOSIS — E1140 Type 2 diabetes mellitus with diabetic neuropathy, unspecified: Secondary | ICD-10-CM | POA: Diagnosis not present

## 2018-04-12 DIAGNOSIS — R55 Syncope and collapse: Secondary | ICD-10-CM | POA: Diagnosis not present

## 2018-04-12 DIAGNOSIS — R1312 Dysphagia, oropharyngeal phase: Secondary | ICD-10-CM | POA: Diagnosis not present

## 2018-04-12 DIAGNOSIS — E669 Obesity, unspecified: Secondary | ICD-10-CM | POA: Diagnosis not present

## 2018-04-12 DIAGNOSIS — M6281 Muscle weakness (generalized): Secondary | ICD-10-CM | POA: Diagnosis not present

## 2018-04-13 DIAGNOSIS — R1312 Dysphagia, oropharyngeal phase: Secondary | ICD-10-CM | POA: Diagnosis not present

## 2018-04-13 DIAGNOSIS — M6281 Muscle weakness (generalized): Secondary | ICD-10-CM | POA: Diagnosis not present

## 2018-04-13 DIAGNOSIS — R55 Syncope and collapse: Secondary | ICD-10-CM | POA: Diagnosis not present

## 2018-04-13 DIAGNOSIS — M4323 Fusion of spine, cervicothoracic region: Secondary | ICD-10-CM | POA: Diagnosis not present

## 2018-04-13 DIAGNOSIS — R41841 Cognitive communication deficit: Secondary | ICD-10-CM | POA: Diagnosis not present

## 2018-04-20 DIAGNOSIS — E669 Obesity, unspecified: Secondary | ICD-10-CM | POA: Diagnosis not present

## 2018-04-20 DIAGNOSIS — E1140 Type 2 diabetes mellitus with diabetic neuropathy, unspecified: Secondary | ICD-10-CM | POA: Diagnosis not present

## 2018-04-20 DIAGNOSIS — Z7952 Long term (current) use of systemic steroids: Secondary | ICD-10-CM | POA: Diagnosis not present

## 2018-04-22 ENCOUNTER — Emergency Department (HOSPITAL_COMMUNITY)
Admission: EM | Admit: 2018-04-22 | Discharge: 2018-04-22 | Payer: Medicare Other | Attending: Emergency Medicine | Admitting: Emergency Medicine

## 2018-04-22 ENCOUNTER — Other Ambulatory Visit: Payer: Self-pay

## 2018-04-22 ENCOUNTER — Encounter (HOSPITAL_COMMUNITY): Payer: Self-pay | Admitting: Pharmacy Technician

## 2018-04-22 ENCOUNTER — Emergency Department (HOSPITAL_COMMUNITY): Payer: Medicare Other

## 2018-04-22 DIAGNOSIS — I491 Atrial premature depolarization: Secondary | ICD-10-CM | POA: Diagnosis not present

## 2018-04-22 DIAGNOSIS — E1140 Type 2 diabetes mellitus with diabetic neuropathy, unspecified: Secondary | ICD-10-CM | POA: Diagnosis not present

## 2018-04-22 DIAGNOSIS — I10 Essential (primary) hypertension: Secondary | ICD-10-CM | POA: Diagnosis not present

## 2018-04-22 DIAGNOSIS — Z96653 Presence of artificial knee joint, bilateral: Secondary | ICD-10-CM | POA: Diagnosis not present

## 2018-04-22 DIAGNOSIS — Z87891 Personal history of nicotine dependence: Secondary | ICD-10-CM | POA: Diagnosis not present

## 2018-04-22 DIAGNOSIS — E119 Type 2 diabetes mellitus without complications: Secondary | ICD-10-CM | POA: Insufficient documentation

## 2018-04-22 DIAGNOSIS — Z743 Need for continuous supervision: Secondary | ICD-10-CM | POA: Diagnosis not present

## 2018-04-22 DIAGNOSIS — R5383 Other fatigue: Secondary | ICD-10-CM | POA: Diagnosis not present

## 2018-04-22 DIAGNOSIS — Z794 Long term (current) use of insulin: Secondary | ICD-10-CM | POA: Diagnosis not present

## 2018-04-22 DIAGNOSIS — W19XXXA Unspecified fall, initial encounter: Secondary | ICD-10-CM | POA: Diagnosis not present

## 2018-04-22 DIAGNOSIS — Z8619 Personal history of other infectious and parasitic diseases: Secondary | ICD-10-CM | POA: Diagnosis not present

## 2018-04-22 DIAGNOSIS — R55 Syncope and collapse: Secondary | ICD-10-CM | POA: Diagnosis not present

## 2018-04-22 DIAGNOSIS — I700 Atherosclerosis of aorta: Secondary | ICD-10-CM | POA: Diagnosis not present

## 2018-04-22 DIAGNOSIS — Z79899 Other long term (current) drug therapy: Secondary | ICD-10-CM | POA: Insufficient documentation

## 2018-04-22 DIAGNOSIS — R279 Unspecified lack of coordination: Secondary | ICD-10-CM | POA: Diagnosis not present

## 2018-04-22 DIAGNOSIS — R531 Weakness: Secondary | ICD-10-CM | POA: Diagnosis not present

## 2018-04-22 DIAGNOSIS — R410 Disorientation, unspecified: Secondary | ICD-10-CM | POA: Diagnosis not present

## 2018-04-22 DIAGNOSIS — R4182 Altered mental status, unspecified: Secondary | ICD-10-CM | POA: Diagnosis not present

## 2018-04-22 LAB — COMPREHENSIVE METABOLIC PANEL
ALBUMIN: 3.1 g/dL — AB (ref 3.5–5.0)
ALK PHOS: 63 U/L (ref 38–126)
ALT: 20 U/L (ref 0–44)
AST: 18 U/L (ref 15–41)
Anion gap: 7 (ref 5–15)
BUN: 29 mg/dL — AB (ref 8–23)
CALCIUM: 9.8 mg/dL (ref 8.9–10.3)
CO2: 32 mmol/L (ref 22–32)
CREATININE: 1.29 mg/dL — AB (ref 0.44–1.00)
Chloride: 100 mmol/L (ref 98–111)
GFR calc Af Amer: 48 mL/min — ABNORMAL LOW (ref >60)
GFR calc non Af Amer: 42 mL/min — ABNORMAL LOW (ref >60)
Glucose, Bld: 164 mg/dL — ABNORMAL HIGH (ref 70–99)
Potassium: 4.2 mmol/L (ref 3.5–5.1)
SODIUM: 139 mmol/L (ref 135–145)
Total Bilirubin: 0.7 mg/dL (ref 0.3–1.2)
Total Protein: 6.5 g/dL (ref 6.5–8.1)

## 2018-04-22 LAB — URINALYSIS, ROUTINE W REFLEX MICROSCOPIC
Bilirubin Urine: NEGATIVE
Glucose, UA: 50 mg/dL — AB
HGB URINE DIPSTICK: NEGATIVE
Ketones, ur: NEGATIVE mg/dL
Leukocytes, UA: NEGATIVE
Nitrite: NEGATIVE
PH: 5 (ref 5.0–8.0)
Protein, ur: NEGATIVE mg/dL
SPECIFIC GRAVITY, URINE: 1.02 (ref 1.005–1.030)

## 2018-04-22 LAB — CBC WITH DIFFERENTIAL/PLATELET
ABS IMMATURE GRANULOCYTES: 0.05 10*3/uL (ref 0.00–0.07)
Basophils Absolute: 0 10*3/uL (ref 0.0–0.1)
Basophils Relative: 0 %
Eosinophils Absolute: 0.2 10*3/uL (ref 0.0–0.5)
Eosinophils Relative: 1 %
HCT: 37.7 % (ref 36.0–46.0)
HEMOGLOBIN: 11.3 g/dL — AB (ref 12.0–15.0)
Immature Granulocytes: 0 %
LYMPHS ABS: 4.3 10*3/uL — AB (ref 0.7–4.0)
LYMPHS PCT: 35 %
MCH: 27.3 pg (ref 26.0–34.0)
MCHC: 30 g/dL (ref 30.0–36.0)
MCV: 91.1 fL (ref 80.0–100.0)
MONO ABS: 0.7 10*3/uL (ref 0.1–1.0)
Monocytes Relative: 6 %
Neutro Abs: 7 10*3/uL (ref 1.7–7.7)
Neutrophils Relative %: 58 %
Platelets: 223 10*3/uL (ref 150–400)
RBC: 4.14 MIL/uL (ref 3.87–5.11)
RDW: 19 % — ABNORMAL HIGH (ref 11.5–15.5)
WBC: 12.2 10*3/uL — AB (ref 4.0–10.5)
nRBC: 0 % (ref 0.0–0.2)

## 2018-04-22 LAB — I-STAT TROPONIN, ED: Troponin i, poc: 0 ng/mL (ref 0.00–0.08)

## 2018-04-22 MED ORDER — ACETAMINOPHEN 325 MG PO TABS
650.0000 mg | ORAL_TABLET | Freq: Once | ORAL | Status: AC
  Administered 2018-04-22: 650 mg via ORAL
  Filled 2018-04-22: qty 2

## 2018-04-22 NOTE — ED Triage Notes (Signed)
Patient arrived from [facility] via EMS. Patient reports that she "felt like I was in a daze and I couldn't get out of it." staff reports that patient had "about 5 mins of AMS." Per EMS, patient has been A&O throughout the ride from [facility].

## 2018-04-22 NOTE — ED Provider Notes (Signed)
[facility] EMERGENCY DEPARTMENT Provider Note   CSN: [id number] Arrival date & time: 04/22/18  1420     History   Chief Complaint No chief complaint on file.   HPI Christy May is a 67 y.o. female.  The history is provided by the patient.  Near Syncope  This is a new problem. The current episode started 3 to 5 hours ago. The problem occurs rarely. The problem has been resolved. Pertinent negatives include no chest pain, no abdominal pain, no headaches and no shortness of breath. Nothing aggravates the symptoms. The symptoms are relieved by rest. She has tried rest for the symptoms. The treatment provided significant relief.    Past Medical History:  Diagnosis Date  . Allergy   . Anemia, unspecified   . Anxiety   . Arthritis    "neck, back, hands" (09/03/2014)  . Chronic airway obstruction, not elsewhere classified    "I was told I don't have this/tests done @ [facility] 05/2014"  . Chronic back pain   . Colon polyps   . Complication of anesthesia    "I've had recall; I probably stopped breathing at least 2 times" (09/03/2014)  . Dehydration   . Depressive disorder, not elsewhere classified   . Diabetic peripheral neuropathy ([place])   . Dysphagia 2007   historyof dysphagia with severe dysmotility by barium swallow-[name]  . Esophageal reflux   . Family history of adverse reaction to anesthesia    "my brother was told they lost him a couple times during OR"  . HCAP (healthcare-associated pneumonia) 03/25/2018  . Heart murmur   . History of hiatal hernia   . Migraine    "couple times/month right now" (09/03/2014)  . Obesity, unspecified   . OSA (obstructive sleep apnea)     CPAP  . Septic shock ([place]) 08/30/2017  . Spondylosis of unspecified site without mention of myelopathy   . Type II diabetes mellitus ([place])   . Unspecified essential hypertension   . Unspecified menopausal and postmenopausal disorder   . Unspecified vitamin D deficiency     . Upper airway cough syndrome     Patient Active Problem List   Diagnosis Date Noted  . HCAP (healthcare-associated pneumonia) 03/25/2018  . Fungal skin infection 03/25/2018  . Thoracic spondylosis with myelopathy 02/17/2018  . Spondylosis, thoracic, with myelopathy 02/17/2018  . Dehydration 02/11/2018  . AKI (acute kidney injury) ([place]) 02/11/2018  . Hypotension 02/11/2018  . Syncope, vasovagal 02/11/2018  . Syncope 02/11/2018  . DM neuropathy, type II diabetes mellitus ([place]) 11/18/2017  . Weakness 11/18/2017  . SIRS (systemic inflammatory response syndrome) ([place]) 11/17/2017  . Diabetes mellitus type 2 in obese ([place]) 11/17/2017  . Acute lower UTI 11/17/2017  . Septic shock ([place]) 08/30/2017  . Acute renal failure (ARF) ([place]) 08/13/2017  . Pressure injury of skin 08/13/2017  . Sepsis due to gram-negative UTI ([place]) 08/12/2017  . Upper airway cough syndrome 09/28/2016  . Acute bronchitis 10/29/2015  . Diabetes ([place]) 08/21/2015  . Chest pain 07/14/2015  . Dyspnea on exertion 07/05/2015  . Obstructive sleep apnea 07/05/2015  . Constipation 03/05/2015  . Allergic rhinitis 03/05/2015  . Hearing loss 03/05/2015  . Vocal cord dysfunction 03/05/2015  . Pain in joint, shoulder region 09/11/2014  . Weakness generalized 09/02/2014  . Acute esophagitis 05/08/2014  . Benign neoplasm of sigmoid colon 05/08/2014  . Change in bowel habits 04/17/2014  . Fecal incontinence 04/17/2014  . Urge incontinence 04/08/2014  . Cough 12/06/2013  .  Orthostasis 07/18/2013  . Acute kidney injury ([place]) 07/18/2013  . Bilateral carpal tunnel syndrome 02/14/2013  . Gout 01/04/2013  . Failure to thrive 06/28/2012  . Gait disorder 06/28/2012  . Lower abdominal pain 12/14/2011  . Preventative health care 08/23/2011  . Cervical radiculopathy 02/11/2011  . Low back pain 02/11/2011  . VITAMIN D DEFICIENCY 11/22/2009  . ANEMIA-NOS 11/22/2009  . DEGENERATIVE JOINT DISEASE, CERVICAL SPINE 11/22/2009  .  Depression 09/16/2009  . POLYNEUROPATHY 09/16/2009  . Morbid obesity ([place]) 09/09/2009  . Essential hypertension 09/09/2009  . GERD 09/09/2009  . Sleep apnea 09/09/2009    Past Surgical History:  Procedure Laterality Date  . ABDOMINAL HYSTERECTOMY     "partial"  . ANTERIOR CERVICAL DECOMP/DISCECTOMY FUSION     multiple cervical spine levels;Dr. Arnoldo Morale  . APPENDECTOMY    . BACK SURGERY    . CHOLECYSTECTOMY OPEN    . COLONOSCOPY N/A 05/08/2014   Procedure: COLONOSCOPY;  Surgeon: [name], MD;  Location: [facility];  Service: Endoscopy;  Laterality: N/A;  . DILATION AND CURETTAGE OF UTERUS    . ESOPHAGOGASTRODUODENOSCOPY N/A 05/08/2014   Procedure: ESOPHAGOGASTRODUODENOSCOPY (EGD);  Surgeon: [name], MD;  Location: [name];  Service: Endoscopy;  Laterality: N/A;  . EXPLORATORY LAPAROTOMY    . EYE SURGERY    . FOOT SURGERY Right X 2   "took blood out of my arms and put platelets in my feet"  . INCISION AND DRAINAGE ABSCESS     Chest  . JOINT REPLACEMENT    . REFRACTIVE SURGERY Bilateral   . THORACIC DISCECTOMY N/A 02/17/2018   Procedure: THORACIC ONE - THORACIC TWO LAMINECTOMY;  Surgeon: Newman Pies, MD;  Location: [facility];  Service: Neurosurgery;  Laterality: N/A;  THORACIC ONE - THORACIC TWO LAMINECTOMY  . TONSILLECTOMY    . TOTAL KNEE ARTHROPLASTY Left 10/2003  . TOTAL KNEE ARTHROPLASTY Right 01/2004   [name]     OB History   None      Home Medications    Prior to Admission medications   Medication Sig Start Date End Date Taking? Authorizing Provider  allopurinol (ZYLOPRIM) 300 MG tablet Take 300 mg by mouth daily.    [provider]  bisacodyl (DULCOLAX) 10 MG suppository Place 1 suppository (10 mg total) rectally daily as needed for moderate constipation. 02/19/18   Regalado, Belkys A, MD  cefdinir (OMNICEF) 300 MG capsule Take 1 capsule (300 mg total) by mouth 2 (two) times daily. 03/28/18   Caren Griffins, MD  Cholecalciferol  (VITAMIN D-3) 1000 units CAPS Take 2,000 Units by mouth daily.    [provider]  cyclobenzaprine (FLEXERIL) 10 MG tablet Take 1 tablet (10 mg total) by mouth 3 (three) times daily as needed for muscle spasms. Patient taking differently: Take 10 mg by mouth 3 (three) times daily.  02/22/18   Regalado, Belkys A, MD  cycloSPORINE (RESTASIS) 0.05 % ophthalmic emulsion Place 1 drop into both eyes 2 (two) times daily.     [provider]  docusate sodium (COLACE) 100 MG capsule Take 1 capsule (100 mg total) by mouth 2 (two) times daily. 02/19/18   Regalado, Belkys A, MD  fluconazole (DIFLUCAN) 150 MG tablet Take 1 tablet (150 mg total) by mouth daily. 03/29/18   Gherghe, Vella Redhead, MD  fluticasone (FLONASE) 50 MCG/ACT nasal spray SHAKE LIQUID AND USE 2 SPRAYS IN EACH NOSTRIL DAILY Patient taking differently: Place 2 sprays into both nostrils daily.  03/08/17   Collene Gobble,  MD  folic acid (FOLVITE) 1 MG tablet Take 1 mg by mouth once a day 09/03/17   Aline August, MD  gabapentin (NEURONTIN) 800 MG tablet Take 800 mg by mouth 3 (three) times daily.    [provider]  HYDROcodone-acetaminophen (NORCO/VICODIN) 5-325 MG tablet Take 2 tablets by mouth every 6 (six) hours as needed (pain). 03/28/18   Caren Griffins, MD  insulin aspart (NOVOLOG) 100 UNIT/ML injection Inject 2-10 Units into the skin See admin instructions. With meals. Per sliding scale. If CBG is 201-250= 2 units 251-300= 4 units 301-350= 6 units 351-400= 8 units 401-450= 10 units. If >450 call MD.    [provider]  insulin glargine (LANTUS) 100 UNIT/ML injection Inject 35 Units into the skin 2 (two) times daily.     [provider]  Insulin Syringe-Needle U-100 (INSULIN SYRINGE 1CC/31GX5/16") 31G X 5/16" 1 ML MISC USE TO INJECT INSULIN TWICE DAILY 05/11/17   Renato Shin, MD  ipratropium (ATROVENT) 0.03 % nasal spray USE 2 SPRAYS IN EACH NOSTRIL THREE TIMES DAILY AS NEEDED FOR  RHINITIS Patient taking differently: Place 2 sprays into both nostrils 3 (three) times daily as needed for rhinitis.  03/17/17   Collene Gobble, MD  irbesartan (AVAPRO) 300 MG tablet Take 300 mg by mouth daily.    [provider]  lidocaine (LIDODERM) 5 % Place 1 patch onto the skin daily. Remove & Discard patch within 12 hours or as directed by MD 02/19/18   Regalado, Jerald Kief A, MD  loratadine (CLARITIN) 10 MG tablet Take 10 mg by mouth daily.    [provider]  meclizine (ANTIVERT) 25 MG tablet Take 1 tablet (25 mg total) by mouth 3 (three) times daily as needed for dizziness. 02/06/18   Dorie Rank, MD  metFORMIN (GLUCOPHAGE) 500 MG tablet TAKE 1 TABLET(500 MG) BY MOUTH TWICE DAILY WITH A MEAL Patient taking differently: Take 500 mg by mouth 2 (two) times daily with a meal.  02/28/18   Renato Shin, MD  metoprolol tartrate (LOPRESSOR) 100 MG tablet Take 100 mg by mouth 2 (two) times daily.     [provider]  montelukast (SINGULAIR) 10 MG tablet TAKE 1 TABLET(10 MG) BY MOUTH DAILY Patient taking differently: Take 10 mg by mouth at bedtime.  04/03/16   Magdalen Spatz, NP  Multiple Vitamins-Minerals (MULTIVITAMIN WITH MINERALS) tablet Take 1 tablet by mouth daily.    [provider]  nystatin cream (MYCOSTATIN) Apply 1 application topically 2 (two) times daily as needed for dry skin (skin irritation).    [provider]  ondansetron (ZOFRAN ODT) 8 MG disintegrating tablet Take 1 tablet (8 mg total) by mouth every 8 (eight) hours as needed for nausea or vomiting. 02/06/18   Dorie Rank, MD  [facility] LANCETS MISC Use to check sugars twice daily DX E11.22 08/23/17   Renato Shin, MD  oxybutynin (DITROPAN-XL) 10 MG 24 hr tablet Take 1 tablet (10 mg total) by mouth daily. 02/18/15   Biagio Borg, MD  pantoprazole (PROTONIX) 40 MG tablet TAKE 1 TABLET(40 MG) BY MOUTH TWICE DAILY Patient taking differently: Take 40 mg by mouth 2 (two) times daily.  03/02/17   Collene Gobble, MD  polyethylene glycol (MIRALAX / GLYCOLAX) packet Take 17 g by mouth 2 (two) times daily. 02/22/18   Regalado, Belkys A, MD  predniSONE (DELTASONE) 10 MG tablet Take 20 mg by mouth daily with breakfast.    [provider]  ranitidine (ZANTAC)  300 MG tablet Take 1 tablet (300 mg total) by mouth 2 (two) times daily. 11/22/17   Debbe Odea, MD  senna (SENOKOT) 8.6 MG TABS tablet Take 1 tablet (8.6 mg total) by mouth daily. 02/23/18   Regalado, Belkys A, MD  sodium chloride (OCEAN) 0.65 % SOLN nasal spray Place 2 sprays into both nostrils 2 (two) times daily.    [provider]    Family History Family History  Problem Relation Age of Onset  . Esophageal cancer Brother   . Esophageal cancer Sister        ?  . Colon polyps Brother   . Pancreatic cancer Sister        ?  . Diabetes Sister   . Diabetes Unknown        Aunt and Uncle  . Heart disease Maternal Grandfather     Social History Social History   Tobacco Use  . Smoking status: Former Smoker    Packs/day: 1.00    Years: 20.00    Pack years: 20.00    Types: Cigarettes    Last attempt to quit: 06/22/1986    Years since quitting: 31.8  . Smokeless tobacco: Never Used  Substance Use Topics  . Alcohol use: Yes  . Drug use: No     Allergies   Ace inhibitors; Penicillins; and Adhesive [tape]   Review of Systems Review of Systems  Constitutional: Negative for chills and fever.  HENT: Negative for ear pain and sore throat.   Eyes: Negative for pain and visual disturbance.  Respiratory: Negative for cough and shortness of breath.   Cardiovascular: Positive for near-syncope. Negative for chest pain and palpitations.  Gastrointestinal: Negative for abdominal pain and vomiting.  Genitourinary: Negative for dysuria and hematuria.  Musculoskeletal: Negative for arthralgias and back pain.  Skin: Negative for color change and rash.  Neurological: Negative for seizures, syncope and headaches.  All other  systems reviewed and are negative.    Physical Exam Updated Vital Signs  ED Triage Vitals  Enc Vitals Group     BP 04/22/18 1422 (!) 119/49     Pulse Rate 04/22/18 1422 77     Resp --      Temp 04/22/18 1422 97.9 F (36.6 C)     Temp Source 04/22/18 1422 Oral     SpO2 04/22/18 1421 100 %     Weight --      Height --      Head Circumference --      Peak Flow --      Pain Score 04/22/18 1439 0     Pain Loc --      Pain Edu? --      Excl. in [place]? --     Physical Exam  Constitutional: She is oriented to person, place, and time. She appears well-developed and well-nourished. No distress.  HENT:  Head: Normocephalic and atraumatic.  Eyes: Pupils are equal, round, and reactive to light. Conjunctivae and EOM are normal.  Neck: Normal range of motion. Neck supple.  Cardiovascular: Normal rate, regular rhythm, normal heart sounds and intact distal pulses.  No murmur heard. Pulmonary/Chest: Effort normal and breath sounds normal. No respiratory distress.  Abdominal: Soft. Bowel sounds are normal. She exhibits no distension. There is no tenderness.  Musculoskeletal: Normal range of motion. She exhibits no edema.  Neurological: She is alert and oriented to person, place, and time. No cranial nerve deficit or sensory deficit. She exhibits normal muscle tone. Coordination normal.  5+/5  strength, normal sensation, no drift, normal finger to nose finger  Skin: Skin is warm and dry. Capillary refill takes less than 2 seconds.  Psychiatric: She has a normal mood and affect.  Nursing note and vitals reviewed.    ED Treatments / Results  Labs (all labs ordered are listed, but only abnormal results are displayed) Labs Reviewed  CBC WITH DIFFERENTIAL/PLATELET - Abnormal; Notable for the following components:      Result Value   WBC 12.2 (*)    Hemoglobin 11.3 (*)    RDW 19.0 (*)    Lymphs Abs 4.3 (*)    All other components within normal limits  COMPREHENSIVE METABOLIC PANEL -  Abnormal; Notable for the following components:   Glucose, Bld 164 (*)    BUN 29 (*)    Creatinine, Ser 1.29 (*)    Albumin 3.1 (*)    GFR calc non Af Amer 42 (*)    GFR calc Af Amer 48 (*)    All other components within normal limits  URINALYSIS, ROUTINE W REFLEX MICROSCOPIC - Abnormal; Notable for the following components:   APPearance HAZY (*)    Glucose, UA 50 (*)    All other components within normal limits  I-STAT TROPONIN, ED    EKG EKG Interpretation  Date/Time:  Friday April 22 2018 18:49:18 EDT Ventricular Rate:  76 PR Interval:    QRS Duration: 83 QT Interval:  373 QTC Calculation: 420 R Axis:   -6 Text Interpretation:  Sinus rhythm Low voltage, precordial leads Confirmed by Lennice Sites [phone]) on 04/22/2018 6:53:37 PM   Radiology Dg Chest 2 View  Result Date: 04/22/2018 CLINICAL DATA:  67 y/o  F; 5 minutes of altered mental status. EXAM: CHEST - 2 VIEW COMPARISON:  03/25/2018 chest radiograph. FINDINGS: Stable cardiac silhouette within normal limits given projection and technique. Aortic calcific atherosclerosis. Linear opacities in the left lung base compatible with atelectasis. No consolidation, effusion, or pneumothorax. ACDF hardware, partially visualized. Multilevel discogenic degenerative changes of thoracic spine. No acute osseous abnormality is evident. IMPRESSION: No acute pulmonary process identified. Electronically Signed   By: Kristine Garbe M.D.   On: 04/22/2018 15:37    Procedures Procedures (including critical care time)  Medications Ordered in ED Medications  acetaminophen (TYLENOL) tablet 650 mg (has no administration in time range)     Initial Impression / Assessment and Plan / ED Course  I have reviewed the triage vital signs and the nursing notes.  Pertinent labs & imaging results that were available during my care of the patient were reviewed by me and considered in my medical decision making (see chart for details).      Berlyn R Bently is a 67 year old female history of anemia, hypertension, diabetes who presents to the ED with near syncope.  Patient with normal vitals.  No fever.  Patient states that she was sitting down and got slightly lightheaded but did not pass out.  She did not hit her head.  She feels better now.  She had not had anything to eat or drink yet and had taken her insulin.  Patient denies any chest pain, shortness of breath, urinary symptoms.  No abdominal pain.  Patient is overall well-appearing.  Normal neurological exam.  Clear breath sounds.  EKG shows sinus rhythm.  Troponin normal.  Doubt cardiac process.  No concern for arrhythmia.  Patient with chest x-ray that showed no signs of pneumonia, no pneumothorax, no pleural effusion.  Patient with no significant anemia, electrolyte  abnormality, leukocytosis.  Urinalysis negative for infection.  Patient felt improved after having something to eat.  Suspect mild orthostasis versus vasovagal event.  Patient remained well-appearing throughout my care and was discharged back to facility.  Recommend follow-up with primary care doctor.  Discharged in good condition.  This chart was dictated using voice recognition software.  Despite best efforts to proofread,  errors can occur which can change the documentation meaning.   Final Clinical Impressions(s) / ED Diagnoses   Final diagnoses:  Near syncope    ED Discharge Orders    None       Lennice Sites, DO 04/22/18 2051

## 2018-04-22 NOTE — ED Notes (Signed)
Patient verbalizes understanding of discharge instructions. Opportunity for questioning and answers were provided. Armband removed by staff, pt discharged from ED with [facility].  

## 2018-04-22 NOTE — ED Notes (Signed)
Notified [facility] for transport back home 

## 2018-04-25 DIAGNOSIS — R251 Tremor, unspecified: Secondary | ICD-10-CM | POA: Diagnosis not present

## 2018-04-25 DIAGNOSIS — I10 Essential (primary) hypertension: Secondary | ICD-10-CM | POA: Diagnosis not present

## 2018-04-25 DIAGNOSIS — E1165 Type 2 diabetes mellitus with hyperglycemia: Secondary | ICD-10-CM | POA: Diagnosis not present

## 2018-04-25 DIAGNOSIS — E669 Obesity, unspecified: Secondary | ICD-10-CM | POA: Diagnosis not present

## 2018-04-29 DIAGNOSIS — M79651 Pain in right thigh: Secondary | ICD-10-CM | POA: Diagnosis not present

## 2018-04-29 DIAGNOSIS — M545 Low back pain: Secondary | ICD-10-CM | POA: Diagnosis not present

## 2018-04-29 DIAGNOSIS — M25551 Pain in right hip: Secondary | ICD-10-CM | POA: Diagnosis not present

## 2018-04-29 DIAGNOSIS — M542 Cervicalgia: Secondary | ICD-10-CM | POA: Diagnosis not present

## 2018-04-29 DIAGNOSIS — M79661 Pain in right lower leg: Secondary | ICD-10-CM | POA: Diagnosis not present

## 2018-05-02 DIAGNOSIS — Z9889 Other specified postprocedural states: Secondary | ICD-10-CM | POA: Diagnosis not present

## 2018-05-02 DIAGNOSIS — R262 Difficulty in walking, not elsewhere classified: Secondary | ICD-10-CM | POA: Diagnosis not present

## 2018-05-03 ENCOUNTER — Other Ambulatory Visit: Payer: Self-pay | Admitting: Licensed Clinical Social Worker

## 2018-05-03 NOTE — Patient Outreach (Signed)
Assessment:  CSW consulted with [name] regarding client current needs and discharge status.  [name] recommended that [name] discharge client from [facility] services since client has been receiving care now for several months at [facility] and [facility] in [place], [place].  Medical care providers at this facility  are assisting client as needed with her current needs. Thus, CSW is discharging Christy May from [facility] services on 05/03/18 since her medical needs are being met at [facility] and [facility] in [place], [place].  [name].Lawanda Holzheimer MSW, LCSW Licensed Clinical Social Worker [facility] Care Management [phone]

## 2018-05-05 DIAGNOSIS — E1165 Type 2 diabetes mellitus with hyperglycemia: Secondary | ICD-10-CM | POA: Diagnosis not present

## 2018-05-05 DIAGNOSIS — G4733 Obstructive sleep apnea (adult) (pediatric): Secondary | ICD-10-CM | POA: Diagnosis not present

## 2018-05-05 DIAGNOSIS — Z7952 Long term (current) use of systemic steroids: Secondary | ICD-10-CM | POA: Diagnosis not present

## 2018-05-05 DIAGNOSIS — I10 Essential (primary) hypertension: Secondary | ICD-10-CM | POA: Diagnosis not present

## 2018-05-12 DIAGNOSIS — E669 Obesity, unspecified: Secondary | ICD-10-CM | POA: Diagnosis not present

## 2018-05-12 DIAGNOSIS — Z7952 Long term (current) use of systemic steroids: Secondary | ICD-10-CM | POA: Diagnosis not present

## 2018-05-12 DIAGNOSIS — E1165 Type 2 diabetes mellitus with hyperglycemia: Secondary | ICD-10-CM | POA: Diagnosis not present

## 2018-05-25 DIAGNOSIS — E1159 Type 2 diabetes mellitus with other circulatory complications: Secondary | ICD-10-CM | POA: Diagnosis not present

## 2018-05-25 DIAGNOSIS — E1142 Type 2 diabetes mellitus with diabetic polyneuropathy: Secondary | ICD-10-CM | POA: Diagnosis not present

## 2018-05-25 DIAGNOSIS — Z6841 Body Mass Index (BMI) 40.0 and over, adult: Secondary | ICD-10-CM | POA: Diagnosis not present

## 2018-05-25 DIAGNOSIS — E1165 Type 2 diabetes mellitus with hyperglycemia: Secondary | ICD-10-CM | POA: Diagnosis not present

## 2018-05-25 DIAGNOSIS — Z794 Long term (current) use of insulin: Secondary | ICD-10-CM | POA: Diagnosis not present

## 2018-05-25 DIAGNOSIS — I10 Essential (primary) hypertension: Secondary | ICD-10-CM | POA: Diagnosis not present

## 2018-06-02 DIAGNOSIS — G959 Disease of spinal cord, unspecified: Secondary | ICD-10-CM | POA: Diagnosis not present

## 2018-06-02 DIAGNOSIS — E1165 Type 2 diabetes mellitus with hyperglycemia: Secondary | ICD-10-CM | POA: Diagnosis not present

## 2018-06-02 DIAGNOSIS — Z7952 Long term (current) use of systemic steroids: Secondary | ICD-10-CM | POA: Diagnosis not present

## 2018-06-02 DIAGNOSIS — E669 Obesity, unspecified: Secondary | ICD-10-CM | POA: Diagnosis not present

## 2018-06-08 DIAGNOSIS — R2689 Other abnormalities of gait and mobility: Secondary | ICD-10-CM | POA: Diagnosis not present

## 2018-06-08 DIAGNOSIS — M6281 Muscle weakness (generalized): Secondary | ICD-10-CM | POA: Diagnosis not present

## 2018-06-09 DIAGNOSIS — M6281 Muscle weakness (generalized): Secondary | ICD-10-CM | POA: Diagnosis not present

## 2018-06-09 DIAGNOSIS — R2689 Other abnormalities of gait and mobility: Secondary | ICD-10-CM | POA: Diagnosis not present

## 2018-06-10 DIAGNOSIS — M6281 Muscle weakness (generalized): Secondary | ICD-10-CM | POA: Diagnosis not present

## 2018-06-10 DIAGNOSIS — R2689 Other abnormalities of gait and mobility: Secondary | ICD-10-CM | POA: Diagnosis not present

## 2018-06-12 DIAGNOSIS — R2689 Other abnormalities of gait and mobility: Secondary | ICD-10-CM | POA: Diagnosis not present

## 2018-06-12 DIAGNOSIS — M6281 Muscle weakness (generalized): Secondary | ICD-10-CM | POA: Diagnosis not present

## 2018-06-13 DIAGNOSIS — M6281 Muscle weakness (generalized): Secondary | ICD-10-CM | POA: Diagnosis not present

## 2018-06-13 DIAGNOSIS — R2689 Other abnormalities of gait and mobility: Secondary | ICD-10-CM | POA: Diagnosis not present

## 2018-06-14 DIAGNOSIS — R2689 Other abnormalities of gait and mobility: Secondary | ICD-10-CM | POA: Diagnosis not present

## 2018-06-14 DIAGNOSIS — M6281 Muscle weakness (generalized): Secondary | ICD-10-CM | POA: Diagnosis not present

## 2018-06-16 DIAGNOSIS — R2689 Other abnormalities of gait and mobility: Secondary | ICD-10-CM | POA: Diagnosis not present

## 2018-06-16 DIAGNOSIS — M6281 Muscle weakness (generalized): Secondary | ICD-10-CM | POA: Diagnosis not present

## 2018-06-16 DIAGNOSIS — Z79899 Other long term (current) drug therapy: Secondary | ICD-10-CM | POA: Diagnosis not present

## 2018-06-16 DIAGNOSIS — E0822 Diabetes mellitus due to underlying condition with diabetic chronic kidney disease: Secondary | ICD-10-CM | POA: Diagnosis not present

## 2018-06-17 DIAGNOSIS — M6281 Muscle weakness (generalized): Secondary | ICD-10-CM | POA: Diagnosis not present

## 2018-06-17 DIAGNOSIS — R2689 Other abnormalities of gait and mobility: Secondary | ICD-10-CM | POA: Diagnosis not present

## 2018-06-20 DIAGNOSIS — R2689 Other abnormalities of gait and mobility: Secondary | ICD-10-CM | POA: Diagnosis not present

## 2018-06-20 DIAGNOSIS — M6281 Muscle weakness (generalized): Secondary | ICD-10-CM | POA: Diagnosis not present

## 2018-06-21 DIAGNOSIS — M6281 Muscle weakness (generalized): Secondary | ICD-10-CM | POA: Diagnosis not present

## 2018-06-21 DIAGNOSIS — R2689 Other abnormalities of gait and mobility: Secondary | ICD-10-CM | POA: Diagnosis not present

## 2018-06-22 DIAGNOSIS — M6281 Muscle weakness (generalized): Secondary | ICD-10-CM | POA: Diagnosis not present

## 2018-06-22 DIAGNOSIS — R293 Abnormal posture: Secondary | ICD-10-CM | POA: Diagnosis not present

## 2018-06-23 DIAGNOSIS — M6281 Muscle weakness (generalized): Secondary | ICD-10-CM | POA: Diagnosis not present

## 2018-06-23 DIAGNOSIS — R293 Abnormal posture: Secondary | ICD-10-CM | POA: Diagnosis not present

## 2018-06-24 DIAGNOSIS — M6281 Muscle weakness (generalized): Secondary | ICD-10-CM | POA: Diagnosis not present

## 2018-06-24 DIAGNOSIS — R293 Abnormal posture: Secondary | ICD-10-CM | POA: Diagnosis not present

## 2018-06-27 DIAGNOSIS — M6281 Muscle weakness (generalized): Secondary | ICD-10-CM | POA: Diagnosis not present

## 2018-06-27 DIAGNOSIS — R293 Abnormal posture: Secondary | ICD-10-CM | POA: Diagnosis not present

## 2018-06-28 ENCOUNTER — Encounter: Payer: Self-pay | Admitting: Emergency Medicine

## 2018-06-28 DIAGNOSIS — M6281 Muscle weakness (generalized): Secondary | ICD-10-CM | POA: Diagnosis not present

## 2018-06-28 DIAGNOSIS — R293 Abnormal posture: Secondary | ICD-10-CM | POA: Diagnosis not present

## 2018-06-29 DIAGNOSIS — M6281 Muscle weakness (generalized): Secondary | ICD-10-CM | POA: Diagnosis not present

## 2018-06-29 DIAGNOSIS — R293 Abnormal posture: Secondary | ICD-10-CM | POA: Diagnosis not present

## 2018-06-29 DIAGNOSIS — Z794 Long term (current) use of insulin: Secondary | ICD-10-CM | POA: Diagnosis not present

## 2018-06-29 DIAGNOSIS — E1165 Type 2 diabetes mellitus with hyperglycemia: Secondary | ICD-10-CM | POA: Diagnosis not present

## 2018-06-29 DIAGNOSIS — M7989 Other specified soft tissue disorders: Secondary | ICD-10-CM | POA: Diagnosis not present

## 2018-06-29 DIAGNOSIS — R262 Difficulty in walking, not elsewhere classified: Secondary | ICD-10-CM | POA: Diagnosis not present

## 2018-06-30 DIAGNOSIS — E1165 Type 2 diabetes mellitus with hyperglycemia: Secondary | ICD-10-CM | POA: Diagnosis not present

## 2018-06-30 DIAGNOSIS — R52 Pain, unspecified: Secondary | ICD-10-CM | POA: Diagnosis not present

## 2018-06-30 DIAGNOSIS — B353 Tinea pedis: Secondary | ICD-10-CM | POA: Diagnosis not present

## 2018-06-30 DIAGNOSIS — Z7952 Long term (current) use of systemic steroids: Secondary | ICD-10-CM | POA: Diagnosis not present

## 2018-07-04 DIAGNOSIS — R410 Disorientation, unspecified: Secondary | ICD-10-CM | POA: Diagnosis not present

## 2018-07-04 DIAGNOSIS — N390 Urinary tract infection, site not specified: Secondary | ICD-10-CM | POA: Diagnosis not present

## 2018-07-04 DIAGNOSIS — E1165 Type 2 diabetes mellitus with hyperglycemia: Secondary | ICD-10-CM | POA: Diagnosis not present

## 2018-07-04 DIAGNOSIS — R8290 Unspecified abnormal findings in urine: Secondary | ICD-10-CM | POA: Diagnosis not present

## 2018-07-06 DIAGNOSIS — E1159 Type 2 diabetes mellitus with other circulatory complications: Secondary | ICD-10-CM | POA: Diagnosis not present

## 2018-07-06 DIAGNOSIS — I10 Essential (primary) hypertension: Secondary | ICD-10-CM | POA: Diagnosis not present

## 2018-07-06 DIAGNOSIS — E1142 Type 2 diabetes mellitus with diabetic polyneuropathy: Secondary | ICD-10-CM | POA: Diagnosis not present

## 2018-07-06 DIAGNOSIS — E1165 Type 2 diabetes mellitus with hyperglycemia: Secondary | ICD-10-CM | POA: Diagnosis not present

## 2018-07-06 DIAGNOSIS — Z794 Long term (current) use of insulin: Secondary | ICD-10-CM | POA: Diagnosis not present

## 2018-07-12 DIAGNOSIS — F329 Major depressive disorder, single episode, unspecified: Secondary | ICD-10-CM | POA: Diagnosis not present

## 2018-07-12 DIAGNOSIS — R52 Pain, unspecified: Secondary | ICD-10-CM | POA: Diagnosis not present

## 2018-07-18 DIAGNOSIS — E1165 Type 2 diabetes mellitus with hyperglycemia: Secondary | ICD-10-CM | POA: Diagnosis not present

## 2018-07-18 DIAGNOSIS — E669 Obesity, unspecified: Secondary | ICD-10-CM | POA: Diagnosis not present

## 2018-07-18 DIAGNOSIS — R52 Pain, unspecified: Secondary | ICD-10-CM | POA: Diagnosis not present

## 2018-07-22 DIAGNOSIS — E038 Other specified hypothyroidism: Secondary | ICD-10-CM | POA: Diagnosis not present

## 2018-07-22 DIAGNOSIS — I110 Hypertensive heart disease with heart failure: Secondary | ICD-10-CM | POA: Diagnosis not present

## 2018-07-22 DIAGNOSIS — I10 Essential (primary) hypertension: Secondary | ICD-10-CM | POA: Diagnosis not present

## 2018-07-27 DIAGNOSIS — H524 Presbyopia: Secondary | ICD-10-CM | POA: Diagnosis not present

## 2018-07-27 DIAGNOSIS — E113213 Type 2 diabetes mellitus with mild nonproliferative diabetic retinopathy with macular edema, bilateral: Secondary | ICD-10-CM | POA: Diagnosis not present

## 2018-07-28 ENCOUNTER — Ambulatory Visit: Payer: Self-pay | Admitting: Emergency Medicine

## 2018-08-01 DIAGNOSIS — K219 Gastro-esophageal reflux disease without esophagitis: Secondary | ICD-10-CM | POA: Diagnosis not present

## 2018-08-01 DIAGNOSIS — R05 Cough: Secondary | ICD-10-CM | POA: Diagnosis not present

## 2018-08-10 DIAGNOSIS — G959 Disease of spinal cord, unspecified: Secondary | ICD-10-CM | POA: Diagnosis not present

## 2018-08-10 DIAGNOSIS — M6281 Muscle weakness (generalized): Secondary | ICD-10-CM | POA: Diagnosis not present

## 2018-08-11 DIAGNOSIS — G959 Disease of spinal cord, unspecified: Secondary | ICD-10-CM | POA: Diagnosis not present

## 2018-08-11 DIAGNOSIS — M6281 Muscle weakness (generalized): Secondary | ICD-10-CM | POA: Diagnosis not present

## 2018-08-12 DIAGNOSIS — M6281 Muscle weakness (generalized): Secondary | ICD-10-CM | POA: Diagnosis not present

## 2018-08-12 DIAGNOSIS — G959 Disease of spinal cord, unspecified: Secondary | ICD-10-CM | POA: Diagnosis not present

## 2018-08-15 DIAGNOSIS — G959 Disease of spinal cord, unspecified: Secondary | ICD-10-CM | POA: Diagnosis not present

## 2018-08-15 DIAGNOSIS — M6281 Muscle weakness (generalized): Secondary | ICD-10-CM | POA: Diagnosis not present

## 2018-08-16 DIAGNOSIS — G959 Disease of spinal cord, unspecified: Secondary | ICD-10-CM | POA: Diagnosis not present

## 2018-08-16 DIAGNOSIS — Z7689 Persons encountering health services in other specified circumstances: Secondary | ICD-10-CM | POA: Diagnosis not present

## 2018-08-16 DIAGNOSIS — F329 Major depressive disorder, single episode, unspecified: Secondary | ICD-10-CM | POA: Diagnosis not present

## 2018-08-16 DIAGNOSIS — M6281 Muscle weakness (generalized): Secondary | ICD-10-CM | POA: Diagnosis not present

## 2018-08-17 DIAGNOSIS — G959 Disease of spinal cord, unspecified: Secondary | ICD-10-CM | POA: Diagnosis not present

## 2018-08-17 DIAGNOSIS — M6281 Muscle weakness (generalized): Secondary | ICD-10-CM | POA: Diagnosis not present

## 2018-08-18 DIAGNOSIS — M6281 Muscle weakness (generalized): Secondary | ICD-10-CM | POA: Diagnosis not present

## 2018-08-18 DIAGNOSIS — G959 Disease of spinal cord, unspecified: Secondary | ICD-10-CM | POA: Diagnosis not present

## 2018-08-19 DIAGNOSIS — G959 Disease of spinal cord, unspecified: Secondary | ICD-10-CM | POA: Diagnosis not present

## 2018-08-19 DIAGNOSIS — M6281 Muscle weakness (generalized): Secondary | ICD-10-CM | POA: Diagnosis not present

## 2018-08-20 DIAGNOSIS — M6281 Muscle weakness (generalized): Secondary | ICD-10-CM | POA: Diagnosis not present

## 2018-08-20 DIAGNOSIS — G959 Disease of spinal cord, unspecified: Secondary | ICD-10-CM | POA: Diagnosis not present

## 2018-08-22 DIAGNOSIS — G959 Disease of spinal cord, unspecified: Secondary | ICD-10-CM | POA: Diagnosis not present

## 2018-08-22 DIAGNOSIS — M6281 Muscle weakness (generalized): Secondary | ICD-10-CM | POA: Diagnosis not present

## 2018-08-23 DIAGNOSIS — G959 Disease of spinal cord, unspecified: Secondary | ICD-10-CM | POA: Diagnosis not present

## 2018-08-23 DIAGNOSIS — M6281 Muscle weakness (generalized): Secondary | ICD-10-CM | POA: Diagnosis not present

## 2018-08-24 DIAGNOSIS — M6281 Muscle weakness (generalized): Secondary | ICD-10-CM | POA: Diagnosis not present

## 2018-08-24 DIAGNOSIS — G959 Disease of spinal cord, unspecified: Secondary | ICD-10-CM | POA: Diagnosis not present

## 2018-08-25 DIAGNOSIS — M6281 Muscle weakness (generalized): Secondary | ICD-10-CM | POA: Diagnosis not present

## 2018-08-25 DIAGNOSIS — G959 Disease of spinal cord, unspecified: Secondary | ICD-10-CM | POA: Diagnosis not present

## 2018-08-26 DIAGNOSIS — G959 Disease of spinal cord, unspecified: Secondary | ICD-10-CM | POA: Diagnosis not present

## 2018-08-26 DIAGNOSIS — M6281 Muscle weakness (generalized): Secondary | ICD-10-CM | POA: Diagnosis not present

## 2018-08-29 DIAGNOSIS — G959 Disease of spinal cord, unspecified: Secondary | ICD-10-CM | POA: Diagnosis not present

## 2018-08-29 DIAGNOSIS — M6281 Muscle weakness (generalized): Secondary | ICD-10-CM | POA: Diagnosis not present

## 2018-08-30 DIAGNOSIS — G959 Disease of spinal cord, unspecified: Secondary | ICD-10-CM | POA: Diagnosis not present

## 2018-08-30 DIAGNOSIS — M6281 Muscle weakness (generalized): Secondary | ICD-10-CM | POA: Diagnosis not present

## 2018-08-31 DIAGNOSIS — M6281 Muscle weakness (generalized): Secondary | ICD-10-CM | POA: Diagnosis not present

## 2018-08-31 DIAGNOSIS — G959 Disease of spinal cord, unspecified: Secondary | ICD-10-CM | POA: Diagnosis not present

## 2018-09-01 DIAGNOSIS — M6281 Muscle weakness (generalized): Secondary | ICD-10-CM | POA: Diagnosis not present

## 2018-09-01 DIAGNOSIS — G959 Disease of spinal cord, unspecified: Secondary | ICD-10-CM | POA: Diagnosis not present

## 2018-09-02 DIAGNOSIS — M6281 Muscle weakness (generalized): Secondary | ICD-10-CM | POA: Diagnosis not present

## 2018-09-02 DIAGNOSIS — G959 Disease of spinal cord, unspecified: Secondary | ICD-10-CM | POA: Diagnosis not present

## 2018-09-05 DIAGNOSIS — M6281 Muscle weakness (generalized): Secondary | ICD-10-CM | POA: Diagnosis not present

## 2018-09-05 DIAGNOSIS — G959 Disease of spinal cord, unspecified: Secondary | ICD-10-CM | POA: Diagnosis not present

## 2018-09-06 DIAGNOSIS — G959 Disease of spinal cord, unspecified: Secondary | ICD-10-CM | POA: Diagnosis not present

## 2018-09-06 DIAGNOSIS — M6281 Muscle weakness (generalized): Secondary | ICD-10-CM | POA: Diagnosis not present

## 2018-09-07 DIAGNOSIS — M6281 Muscle weakness (generalized): Secondary | ICD-10-CM | POA: Diagnosis not present

## 2018-09-07 DIAGNOSIS — G959 Disease of spinal cord, unspecified: Secondary | ICD-10-CM | POA: Diagnosis not present

## 2018-09-08 DIAGNOSIS — Z79899 Other long term (current) drug therapy: Secondary | ICD-10-CM | POA: Diagnosis not present

## 2018-09-08 DIAGNOSIS — G959 Disease of spinal cord, unspecified: Secondary | ICD-10-CM | POA: Diagnosis not present

## 2018-09-08 DIAGNOSIS — M6281 Muscle weakness (generalized): Secondary | ICD-10-CM | POA: Diagnosis not present

## 2018-09-09 DIAGNOSIS — M6281 Muscle weakness (generalized): Secondary | ICD-10-CM | POA: Diagnosis not present

## 2018-09-09 DIAGNOSIS — G959 Disease of spinal cord, unspecified: Secondary | ICD-10-CM | POA: Diagnosis not present

## 2018-09-12 DIAGNOSIS — M6281 Muscle weakness (generalized): Secondary | ICD-10-CM | POA: Diagnosis not present

## 2018-09-12 DIAGNOSIS — G959 Disease of spinal cord, unspecified: Secondary | ICD-10-CM | POA: Diagnosis not present

## 2018-09-13 DIAGNOSIS — G959 Disease of spinal cord, unspecified: Secondary | ICD-10-CM | POA: Diagnosis not present

## 2018-09-13 DIAGNOSIS — D649 Anemia, unspecified: Secondary | ICD-10-CM | POA: Diagnosis not present

## 2018-09-13 DIAGNOSIS — M6281 Muscle weakness (generalized): Secondary | ICD-10-CM | POA: Diagnosis not present

## 2018-09-13 DIAGNOSIS — Z6841 Body Mass Index (BMI) 40.0 and over, adult: Secondary | ICD-10-CM | POA: Diagnosis not present

## 2018-09-13 DIAGNOSIS — K219 Gastro-esophageal reflux disease without esophagitis: Secondary | ICD-10-CM | POA: Diagnosis not present

## 2018-09-14 DIAGNOSIS — G959 Disease of spinal cord, unspecified: Secondary | ICD-10-CM | POA: Diagnosis not present

## 2018-09-14 DIAGNOSIS — M6281 Muscle weakness (generalized): Secondary | ICD-10-CM | POA: Diagnosis not present

## 2018-09-15 DIAGNOSIS — G959 Disease of spinal cord, unspecified: Secondary | ICD-10-CM | POA: Diagnosis not present

## 2018-09-15 DIAGNOSIS — M6281 Muscle weakness (generalized): Secondary | ICD-10-CM | POA: Diagnosis not present

## 2018-09-16 DIAGNOSIS — M6281 Muscle weakness (generalized): Secondary | ICD-10-CM | POA: Diagnosis not present

## 2018-09-16 DIAGNOSIS — G959 Disease of spinal cord, unspecified: Secondary | ICD-10-CM | POA: Diagnosis not present

## 2018-09-19 DIAGNOSIS — M6281 Muscle weakness (generalized): Secondary | ICD-10-CM | POA: Diagnosis not present

## 2018-09-19 DIAGNOSIS — G959 Disease of spinal cord, unspecified: Secondary | ICD-10-CM | POA: Diagnosis not present

## 2018-09-20 DIAGNOSIS — M6281 Muscle weakness (generalized): Secondary | ICD-10-CM | POA: Diagnosis not present

## 2018-09-20 DIAGNOSIS — G959 Disease of spinal cord, unspecified: Secondary | ICD-10-CM | POA: Diagnosis not present

## 2018-09-21 DIAGNOSIS — G959 Disease of spinal cord, unspecified: Secondary | ICD-10-CM | POA: Diagnosis not present

## 2018-09-21 DIAGNOSIS — M6281 Muscle weakness (generalized): Secondary | ICD-10-CM | POA: Diagnosis not present

## 2018-09-22 DIAGNOSIS — M6281 Muscle weakness (generalized): Secondary | ICD-10-CM | POA: Diagnosis not present

## 2018-09-22 DIAGNOSIS — G959 Disease of spinal cord, unspecified: Secondary | ICD-10-CM | POA: Diagnosis not present

## 2018-09-23 DIAGNOSIS — G959 Disease of spinal cord, unspecified: Secondary | ICD-10-CM | POA: Diagnosis not present

## 2018-09-23 DIAGNOSIS — M6281 Muscle weakness (generalized): Secondary | ICD-10-CM | POA: Diagnosis not present

## 2018-09-26 DIAGNOSIS — G959 Disease of spinal cord, unspecified: Secondary | ICD-10-CM | POA: Diagnosis not present

## 2018-09-26 DIAGNOSIS — M6281 Muscle weakness (generalized): Secondary | ICD-10-CM | POA: Diagnosis not present

## 2018-09-28 DIAGNOSIS — M6281 Muscle weakness (generalized): Secondary | ICD-10-CM | POA: Diagnosis not present

## 2018-09-28 DIAGNOSIS — G959 Disease of spinal cord, unspecified: Secondary | ICD-10-CM | POA: Diagnosis not present

## 2018-09-29 DIAGNOSIS — M6281 Muscle weakness (generalized): Secondary | ICD-10-CM | POA: Diagnosis not present

## 2018-09-29 DIAGNOSIS — G959 Disease of spinal cord, unspecified: Secondary | ICD-10-CM | POA: Diagnosis not present

## 2018-09-30 DIAGNOSIS — G959 Disease of spinal cord, unspecified: Secondary | ICD-10-CM | POA: Diagnosis not present

## 2018-09-30 DIAGNOSIS — M6281 Muscle weakness (generalized): Secondary | ICD-10-CM | POA: Diagnosis not present

## 2018-10-01 DIAGNOSIS — M6281 Muscle weakness (generalized): Secondary | ICD-10-CM | POA: Diagnosis not present

## 2018-10-01 DIAGNOSIS — G959 Disease of spinal cord, unspecified: Secondary | ICD-10-CM | POA: Diagnosis not present

## 2018-10-04 DIAGNOSIS — G959 Disease of spinal cord, unspecified: Secondary | ICD-10-CM | POA: Diagnosis not present

## 2018-10-04 DIAGNOSIS — M6281 Muscle weakness (generalized): Secondary | ICD-10-CM | POA: Diagnosis not present

## 2018-10-05 DIAGNOSIS — G959 Disease of spinal cord, unspecified: Secondary | ICD-10-CM | POA: Diagnosis not present

## 2018-10-05 DIAGNOSIS — M6281 Muscle weakness (generalized): Secondary | ICD-10-CM | POA: Diagnosis not present

## 2018-10-06 DIAGNOSIS — R05 Cough: Secondary | ICD-10-CM | POA: Diagnosis not present

## 2018-10-06 DIAGNOSIS — M6281 Muscle weakness (generalized): Secondary | ICD-10-CM | POA: Diagnosis not present

## 2018-10-06 DIAGNOSIS — M79604 Pain in right leg: Secondary | ICD-10-CM | POA: Diagnosis not present

## 2018-10-06 DIAGNOSIS — Z6841 Body Mass Index (BMI) 40.0 and over, adult: Secondary | ICD-10-CM | POA: Diagnosis not present

## 2018-10-06 DIAGNOSIS — R079 Chest pain, unspecified: Secondary | ICD-10-CM | POA: Diagnosis not present

## 2018-10-06 DIAGNOSIS — R0902 Hypoxemia: Secondary | ICD-10-CM | POA: Diagnosis not present

## 2018-10-06 DIAGNOSIS — G959 Disease of spinal cord, unspecified: Secondary | ICD-10-CM | POA: Diagnosis not present

## 2018-10-07 DIAGNOSIS — M6281 Muscle weakness (generalized): Secondary | ICD-10-CM | POA: Diagnosis not present

## 2018-10-07 DIAGNOSIS — G959 Disease of spinal cord, unspecified: Secondary | ICD-10-CM | POA: Diagnosis not present

## 2018-10-08 DIAGNOSIS — G959 Disease of spinal cord, unspecified: Secondary | ICD-10-CM | POA: Diagnosis not present

## 2018-10-08 DIAGNOSIS — M6281 Muscle weakness (generalized): Secondary | ICD-10-CM | POA: Diagnosis not present

## 2018-10-11 DIAGNOSIS — G959 Disease of spinal cord, unspecified: Secondary | ICD-10-CM | POA: Diagnosis not present

## 2018-10-11 DIAGNOSIS — M6281 Muscle weakness (generalized): Secondary | ICD-10-CM | POA: Diagnosis not present

## 2018-10-12 DIAGNOSIS — G959 Disease of spinal cord, unspecified: Secondary | ICD-10-CM | POA: Diagnosis not present

## 2018-10-12 DIAGNOSIS — M6281 Muscle weakness (generalized): Secondary | ICD-10-CM | POA: Diagnosis not present

## 2018-10-13 DIAGNOSIS — M6281 Muscle weakness (generalized): Secondary | ICD-10-CM | POA: Diagnosis not present

## 2018-10-13 DIAGNOSIS — G959 Disease of spinal cord, unspecified: Secondary | ICD-10-CM | POA: Diagnosis not present

## 2018-10-14 DIAGNOSIS — G959 Disease of spinal cord, unspecified: Secondary | ICD-10-CM | POA: Diagnosis not present

## 2018-10-14 DIAGNOSIS — M6281 Muscle weakness (generalized): Secondary | ICD-10-CM | POA: Diagnosis not present

## 2018-10-15 DIAGNOSIS — G959 Disease of spinal cord, unspecified: Secondary | ICD-10-CM | POA: Diagnosis not present

## 2018-10-15 DIAGNOSIS — M6281 Muscle weakness (generalized): Secondary | ICD-10-CM | POA: Diagnosis not present

## 2018-10-17 DIAGNOSIS — R300 Dysuria: Secondary | ICD-10-CM | POA: Diagnosis not present

## 2018-10-17 DIAGNOSIS — R198 Other specified symptoms and signs involving the digestive system and abdomen: Secondary | ICD-10-CM | POA: Diagnosis not present

## 2018-10-17 DIAGNOSIS — Z6841 Body Mass Index (BMI) 40.0 and over, adult: Secondary | ICD-10-CM | POA: Diagnosis not present

## 2018-10-17 DIAGNOSIS — E1140 Type 2 diabetes mellitus with diabetic neuropathy, unspecified: Secondary | ICD-10-CM | POA: Diagnosis not present

## 2018-10-18 DIAGNOSIS — M6281 Muscle weakness (generalized): Secondary | ICD-10-CM | POA: Diagnosis not present

## 2018-10-18 DIAGNOSIS — G959 Disease of spinal cord, unspecified: Secondary | ICD-10-CM | POA: Diagnosis not present

## 2018-10-20 DIAGNOSIS — M6281 Muscle weakness (generalized): Secondary | ICD-10-CM | POA: Diagnosis not present

## 2018-10-20 DIAGNOSIS — G959 Disease of spinal cord, unspecified: Secondary | ICD-10-CM | POA: Diagnosis not present

## 2018-10-21 DIAGNOSIS — Z79899 Other long term (current) drug therapy: Secondary | ICD-10-CM | POA: Diagnosis not present

## 2018-10-21 DIAGNOSIS — M6281 Muscle weakness (generalized): Secondary | ICD-10-CM | POA: Diagnosis not present

## 2018-10-21 DIAGNOSIS — G959 Disease of spinal cord, unspecified: Secondary | ICD-10-CM | POA: Diagnosis not present

## 2018-10-24 DIAGNOSIS — G959 Disease of spinal cord, unspecified: Secondary | ICD-10-CM | POA: Diagnosis not present

## 2018-10-24 DIAGNOSIS — M6281 Muscle weakness (generalized): Secondary | ICD-10-CM | POA: Diagnosis not present

## 2018-10-25 DIAGNOSIS — M6281 Muscle weakness (generalized): Secondary | ICD-10-CM | POA: Diagnosis not present

## 2018-10-25 DIAGNOSIS — G959 Disease of spinal cord, unspecified: Secondary | ICD-10-CM | POA: Diagnosis not present

## 2018-10-26 DIAGNOSIS — N390 Urinary tract infection, site not specified: Secondary | ICD-10-CM | POA: Diagnosis not present

## 2018-10-26 DIAGNOSIS — J449 Chronic obstructive pulmonary disease, unspecified: Secondary | ICD-10-CM | POA: Diagnosis not present

## 2018-10-26 DIAGNOSIS — M6281 Muscle weakness (generalized): Secondary | ICD-10-CM | POA: Diagnosis not present

## 2018-10-26 DIAGNOSIS — G959 Disease of spinal cord, unspecified: Secondary | ICD-10-CM | POA: Diagnosis not present

## 2018-10-27 DIAGNOSIS — M6281 Muscle weakness (generalized): Secondary | ICD-10-CM | POA: Diagnosis not present

## 2018-10-27 DIAGNOSIS — G959 Disease of spinal cord, unspecified: Secondary | ICD-10-CM | POA: Diagnosis not present

## 2018-10-28 DIAGNOSIS — M6281 Muscle weakness (generalized): Secondary | ICD-10-CM | POA: Diagnosis not present

## 2018-10-28 DIAGNOSIS — G959 Disease of spinal cord, unspecified: Secondary | ICD-10-CM | POA: Diagnosis not present

## 2018-10-30 DIAGNOSIS — G959 Disease of spinal cord, unspecified: Secondary | ICD-10-CM | POA: Diagnosis not present

## 2018-10-30 DIAGNOSIS — M6281 Muscle weakness (generalized): Secondary | ICD-10-CM | POA: Diagnosis not present

## 2018-10-31 DIAGNOSIS — G959 Disease of spinal cord, unspecified: Secondary | ICD-10-CM | POA: Diagnosis not present

## 2018-10-31 DIAGNOSIS — M6281 Muscle weakness (generalized): Secondary | ICD-10-CM | POA: Diagnosis not present

## 2018-11-01 DIAGNOSIS — G959 Disease of spinal cord, unspecified: Secondary | ICD-10-CM | POA: Diagnosis not present

## 2018-11-01 DIAGNOSIS — M6281 Muscle weakness (generalized): Secondary | ICD-10-CM | POA: Diagnosis not present

## 2018-11-03 DIAGNOSIS — G959 Disease of spinal cord, unspecified: Secondary | ICD-10-CM | POA: Diagnosis not present

## 2018-11-03 DIAGNOSIS — M6281 Muscle weakness (generalized): Secondary | ICD-10-CM | POA: Diagnosis not present

## 2018-11-04 DIAGNOSIS — G959 Disease of spinal cord, unspecified: Secondary | ICD-10-CM | POA: Diagnosis not present

## 2018-11-04 DIAGNOSIS — M6281 Muscle weakness (generalized): Secondary | ICD-10-CM | POA: Diagnosis not present

## 2018-11-07 DIAGNOSIS — M6281 Muscle weakness (generalized): Secondary | ICD-10-CM | POA: Diagnosis not present

## 2018-11-07 DIAGNOSIS — G959 Disease of spinal cord, unspecified: Secondary | ICD-10-CM | POA: Diagnosis not present

## 2018-11-08 DIAGNOSIS — G959 Disease of spinal cord, unspecified: Secondary | ICD-10-CM | POA: Diagnosis not present

## 2018-11-08 DIAGNOSIS — M6281 Muscle weakness (generalized): Secondary | ICD-10-CM | POA: Diagnosis not present

## 2018-11-09 DIAGNOSIS — G959 Disease of spinal cord, unspecified: Secondary | ICD-10-CM | POA: Diagnosis not present

## 2018-11-09 DIAGNOSIS — G4733 Obstructive sleep apnea (adult) (pediatric): Secondary | ICD-10-CM | POA: Diagnosis not present

## 2018-11-09 DIAGNOSIS — J449 Chronic obstructive pulmonary disease, unspecified: Secondary | ICD-10-CM | POA: Diagnosis not present

## 2018-11-09 DIAGNOSIS — M6281 Muscle weakness (generalized): Secondary | ICD-10-CM | POA: Diagnosis not present

## 2018-11-09 DIAGNOSIS — N390 Urinary tract infection, site not specified: Secondary | ICD-10-CM | POA: Diagnosis not present

## 2018-11-10 DIAGNOSIS — R300 Dysuria: Secondary | ICD-10-CM | POA: Diagnosis not present

## 2018-11-10 DIAGNOSIS — E0822 Diabetes mellitus due to underlying condition with diabetic chronic kidney disease: Secondary | ICD-10-CM | POA: Diagnosis not present

## 2018-11-10 DIAGNOSIS — M6281 Muscle weakness (generalized): Secondary | ICD-10-CM | POA: Diagnosis not present

## 2018-11-10 DIAGNOSIS — G959 Disease of spinal cord, unspecified: Secondary | ICD-10-CM | POA: Diagnosis not present

## 2018-11-11 DIAGNOSIS — M6281 Muscle weakness (generalized): Secondary | ICD-10-CM | POA: Diagnosis not present

## 2018-11-11 DIAGNOSIS — G959 Disease of spinal cord, unspecified: Secondary | ICD-10-CM | POA: Diagnosis not present

## 2018-11-13 DIAGNOSIS — G959 Disease of spinal cord, unspecified: Secondary | ICD-10-CM | POA: Diagnosis not present

## 2018-11-13 DIAGNOSIS — M6281 Muscle weakness (generalized): Secondary | ICD-10-CM | POA: Diagnosis not present

## 2018-11-14 DIAGNOSIS — M6281 Muscle weakness (generalized): Secondary | ICD-10-CM | POA: Diagnosis not present

## 2018-11-14 DIAGNOSIS — G959 Disease of spinal cord, unspecified: Secondary | ICD-10-CM | POA: Diagnosis not present

## 2018-11-15 DIAGNOSIS — G959 Disease of spinal cord, unspecified: Secondary | ICD-10-CM | POA: Diagnosis not present

## 2018-11-15 DIAGNOSIS — M6281 Muscle weakness (generalized): Secondary | ICD-10-CM | POA: Diagnosis not present

## 2018-11-16 DIAGNOSIS — M6281 Muscle weakness (generalized): Secondary | ICD-10-CM | POA: Diagnosis not present

## 2018-11-16 DIAGNOSIS — J449 Chronic obstructive pulmonary disease, unspecified: Secondary | ICD-10-CM | POA: Diagnosis not present

## 2018-11-16 DIAGNOSIS — G959 Disease of spinal cord, unspecified: Secondary | ICD-10-CM | POA: Diagnosis not present

## 2018-11-16 DIAGNOSIS — G4733 Obstructive sleep apnea (adult) (pediatric): Secondary | ICD-10-CM | POA: Diagnosis not present

## 2018-11-17 DIAGNOSIS — M6281 Muscle weakness (generalized): Secondary | ICD-10-CM | POA: Diagnosis not present

## 2018-11-17 DIAGNOSIS — G959 Disease of spinal cord, unspecified: Secondary | ICD-10-CM | POA: Diagnosis not present

## 2018-11-18 DIAGNOSIS — M6281 Muscle weakness (generalized): Secondary | ICD-10-CM | POA: Diagnosis not present

## 2018-11-18 DIAGNOSIS — G959 Disease of spinal cord, unspecified: Secondary | ICD-10-CM | POA: Diagnosis not present

## 2018-11-19 DIAGNOSIS — M6281 Muscle weakness (generalized): Secondary | ICD-10-CM | POA: Diagnosis not present

## 2018-11-19 DIAGNOSIS — G959 Disease of spinal cord, unspecified: Secondary | ICD-10-CM | POA: Diagnosis not present

## 2018-11-21 DIAGNOSIS — G959 Disease of spinal cord, unspecified: Secondary | ICD-10-CM | POA: Diagnosis not present

## 2018-11-21 DIAGNOSIS — M6281 Muscle weakness (generalized): Secondary | ICD-10-CM | POA: Diagnosis not present

## 2018-11-22 DIAGNOSIS — M6281 Muscle weakness (generalized): Secondary | ICD-10-CM | POA: Diagnosis not present

## 2018-11-22 DIAGNOSIS — G959 Disease of spinal cord, unspecified: Secondary | ICD-10-CM | POA: Diagnosis not present

## 2018-11-23 DIAGNOSIS — J449 Chronic obstructive pulmonary disease, unspecified: Secondary | ICD-10-CM | POA: Diagnosis not present

## 2018-11-23 DIAGNOSIS — Z794 Long term (current) use of insulin: Secondary | ICD-10-CM | POA: Diagnosis not present

## 2018-11-23 DIAGNOSIS — M6281 Muscle weakness (generalized): Secondary | ICD-10-CM | POA: Diagnosis not present

## 2018-11-23 DIAGNOSIS — E1140 Type 2 diabetes mellitus with diabetic neuropathy, unspecified: Secondary | ICD-10-CM | POA: Diagnosis not present

## 2018-11-23 DIAGNOSIS — G959 Disease of spinal cord, unspecified: Secondary | ICD-10-CM | POA: Diagnosis not present

## 2018-11-23 DIAGNOSIS — Z79899 Other long term (current) drug therapy: Secondary | ICD-10-CM | POA: Diagnosis not present

## 2018-11-24 DIAGNOSIS — G959 Disease of spinal cord, unspecified: Secondary | ICD-10-CM | POA: Diagnosis not present

## 2018-11-24 DIAGNOSIS — M6281 Muscle weakness (generalized): Secondary | ICD-10-CM | POA: Diagnosis not present

## 2018-11-25 DIAGNOSIS — M6281 Muscle weakness (generalized): Secondary | ICD-10-CM | POA: Diagnosis not present

## 2018-11-25 DIAGNOSIS — G959 Disease of spinal cord, unspecified: Secondary | ICD-10-CM | POA: Diagnosis not present

## 2018-11-26 DIAGNOSIS — M6281 Muscle weakness (generalized): Secondary | ICD-10-CM | POA: Diagnosis not present

## 2018-11-26 DIAGNOSIS — G959 Disease of spinal cord, unspecified: Secondary | ICD-10-CM | POA: Diagnosis not present

## 2018-11-28 DIAGNOSIS — M6281 Muscle weakness (generalized): Secondary | ICD-10-CM | POA: Diagnosis not present

## 2018-11-28 DIAGNOSIS — G959 Disease of spinal cord, unspecified: Secondary | ICD-10-CM | POA: Diagnosis not present

## 2018-11-29 DIAGNOSIS — M6281 Muscle weakness (generalized): Secondary | ICD-10-CM | POA: Diagnosis not present

## 2018-11-29 DIAGNOSIS — G959 Disease of spinal cord, unspecified: Secondary | ICD-10-CM | POA: Diagnosis not present

## 2018-11-30 DIAGNOSIS — G959 Disease of spinal cord, unspecified: Secondary | ICD-10-CM | POA: Diagnosis not present

## 2018-11-30 DIAGNOSIS — M6281 Muscle weakness (generalized): Secondary | ICD-10-CM | POA: Diagnosis not present

## 2018-12-01 DIAGNOSIS — G959 Disease of spinal cord, unspecified: Secondary | ICD-10-CM | POA: Diagnosis not present

## 2018-12-01 DIAGNOSIS — M6281 Muscle weakness (generalized): Secondary | ICD-10-CM | POA: Diagnosis not present

## 2018-12-02 DIAGNOSIS — E1165 Type 2 diabetes mellitus with hyperglycemia: Secondary | ICD-10-CM | POA: Diagnosis not present

## 2018-12-02 DIAGNOSIS — Z79899 Other long term (current) drug therapy: Secondary | ICD-10-CM | POA: Diagnosis not present

## 2018-12-02 DIAGNOSIS — G4733 Obstructive sleep apnea (adult) (pediatric): Secondary | ICD-10-CM | POA: Diagnosis not present

## 2018-12-02 DIAGNOSIS — G959 Disease of spinal cord, unspecified: Secondary | ICD-10-CM | POA: Diagnosis not present

## 2018-12-02 DIAGNOSIS — J449 Chronic obstructive pulmonary disease, unspecified: Secondary | ICD-10-CM | POA: Diagnosis not present

## 2018-12-02 DIAGNOSIS — M6281 Muscle weakness (generalized): Secondary | ICD-10-CM | POA: Diagnosis not present

## 2018-12-03 DIAGNOSIS — G959 Disease of spinal cord, unspecified: Secondary | ICD-10-CM | POA: Diagnosis not present

## 2018-12-03 DIAGNOSIS — M6281 Muscle weakness (generalized): Secondary | ICD-10-CM | POA: Diagnosis not present

## 2018-12-04 DIAGNOSIS — M6281 Muscle weakness (generalized): Secondary | ICD-10-CM | POA: Diagnosis not present

## 2018-12-04 DIAGNOSIS — G959 Disease of spinal cord, unspecified: Secondary | ICD-10-CM | POA: Diagnosis not present

## 2018-12-05 DIAGNOSIS — G959 Disease of spinal cord, unspecified: Secondary | ICD-10-CM | POA: Diagnosis not present

## 2018-12-05 DIAGNOSIS — M6281 Muscle weakness (generalized): Secondary | ICD-10-CM | POA: Diagnosis not present

## 2018-12-06 DIAGNOSIS — G959 Disease of spinal cord, unspecified: Secondary | ICD-10-CM | POA: Diagnosis not present

## 2018-12-06 DIAGNOSIS — M6281 Muscle weakness (generalized): Secondary | ICD-10-CM | POA: Diagnosis not present

## 2018-12-07 DIAGNOSIS — G959 Disease of spinal cord, unspecified: Secondary | ICD-10-CM | POA: Diagnosis not present

## 2018-12-07 DIAGNOSIS — M6281 Muscle weakness (generalized): Secondary | ICD-10-CM | POA: Diagnosis not present

## 2018-12-08 DIAGNOSIS — H43813 Vitreous degeneration, bilateral: Secondary | ICD-10-CM | POA: Diagnosis not present

## 2018-12-08 DIAGNOSIS — H35371 Puckering of macula, right eye: Secondary | ICD-10-CM | POA: Diagnosis not present

## 2018-12-08 DIAGNOSIS — E0822 Diabetes mellitus due to underlying condition with diabetic chronic kidney disease: Secondary | ICD-10-CM | POA: Diagnosis not present

## 2018-12-08 DIAGNOSIS — N183 Chronic kidney disease, stage 3 (moderate): Secondary | ICD-10-CM | POA: Diagnosis not present

## 2018-12-08 DIAGNOSIS — Z79899 Other long term (current) drug therapy: Secondary | ICD-10-CM | POA: Diagnosis not present

## 2018-12-08 DIAGNOSIS — G959 Disease of spinal cord, unspecified: Secondary | ICD-10-CM | POA: Diagnosis not present

## 2018-12-08 DIAGNOSIS — E103393 Type 1 diabetes mellitus with moderate nonproliferative diabetic retinopathy without macular edema, bilateral: Secondary | ICD-10-CM | POA: Diagnosis not present

## 2018-12-08 DIAGNOSIS — H35033 Hypertensive retinopathy, bilateral: Secondary | ICD-10-CM | POA: Diagnosis not present

## 2018-12-08 DIAGNOSIS — M6281 Muscle weakness (generalized): Secondary | ICD-10-CM | POA: Diagnosis not present

## 2018-12-08 DIAGNOSIS — I110 Hypertensive heart disease with heart failure: Secondary | ICD-10-CM | POA: Diagnosis not present

## 2018-12-09 DIAGNOSIS — E1165 Type 2 diabetes mellitus with hyperglycemia: Secondary | ICD-10-CM | POA: Diagnosis not present

## 2018-12-09 DIAGNOSIS — G959 Disease of spinal cord, unspecified: Secondary | ICD-10-CM | POA: Diagnosis not present

## 2018-12-09 DIAGNOSIS — E875 Hyperkalemia: Secondary | ICD-10-CM | POA: Diagnosis not present

## 2018-12-09 DIAGNOSIS — M6281 Muscle weakness (generalized): Secondary | ICD-10-CM | POA: Diagnosis not present

## 2018-12-12 ENCOUNTER — Ambulatory Visit (INDEPENDENT_AMBULATORY_CARE_PROVIDER_SITE_OTHER): Payer: Medicare Other | Admitting: Emergency Medicine

## 2018-12-12 ENCOUNTER — Other Ambulatory Visit: Payer: Self-pay

## 2018-12-12 ENCOUNTER — Encounter: Payer: Self-pay | Admitting: Emergency Medicine

## 2018-12-12 DIAGNOSIS — R05 Cough: Secondary | ICD-10-CM

## 2018-12-12 DIAGNOSIS — G4733 Obstructive sleep apnea (adult) (pediatric): Secondary | ICD-10-CM

## 2018-12-12 DIAGNOSIS — M6281 Muscle weakness (generalized): Secondary | ICD-10-CM | POA: Diagnosis not present

## 2018-12-12 DIAGNOSIS — R058 Other specified cough: Secondary | ICD-10-CM

## 2018-12-12 DIAGNOSIS — G959 Disease of spinal cord, unspecified: Secondary | ICD-10-CM | POA: Diagnosis not present

## 2018-12-12 NOTE — Progress Notes (Signed)
Subjective:    Patient ID: Christy May, female    DOB: [birth year], 68 y.o.   MRN: [id number]  HPI 68 yo woman, former smoker with obesity, OSA, HTN on irbesartan, DM, childhood asthma. Carries a dx of adult asthma although PFT 2015 most consistent w restriction and variable UA irritation on inspiratory loop. She has done speech rx and training for VCD before. Contributing factors are chronic rhinitis, GERD. She has felt that albuterol has helped her in the past.   ROV 11/11/16 - patient has a history of upper airway irritation vocal cord dysfunction, probably some superimposed asthma, obstructive sleep apnea, hypertension, chronic rhinitis and GERD. She was seen one month ago for an exacerbation of her cough. She has used Hycodan to suppress the cough; there has been some question about whether she has over filled prescriptions for her chronic cough syrup in the past.. She is on Protonix twice a day, Pepcid, nasal saline spray, Singulair, Claritin, Atrovent nasal spray. She had been doing quite well for the last month, was exposed to URI and developed cold sx about 2 days ago. She is using hycodan prn. She is wearing her CPAP reliably.   Acute OV 12/15/16 -- Christy May presents today with increased coughing, HA, more head and eye drainage. Throat burning. Has been happening since beginning of June. She was treated with azithromycin 11/25/16.  Current regimen includes nasal saline, ranitidine 300 mg at bedtime, protonix 40 mg twice a day, Singulair 10 mg daily, loratadine 10 mg daily, Neurontin, flonase daily, atrovent NS, chlorpheniramine 4 mg up to every 6 hours, prednisone 20 g daily. She had been taking hycodan with some improvement, ran out 2 days ago.   ROV 03/15/17 -- Follow-up visit for patient with a history of chronic cough, GERD, chronic rhinitis. Also w hx HTN, DM, OSA. She is having difficulty tolerating CPAP > poor mask fit.  Her cough is better, still requires prn cough suppression.  She started tramadol and hycodan about 2 weeks ago, called it in for her. She remains on pred 20mg .   She tells me that she is trying to wear her CPAP, but having a hard time tolerating. She has tried multiple masks, currently trying full face mask. Seem to be impacting her eyes. compliance >> 37% for greater than 4 hours.   ROV 12/12/2018 --Christy May is 68, former smoker with a history of obesity, hypertension, diabetes, childhood asthma.  I have followed her for chronic cough and upper airway irritation syndrome in the setting of GERD, allergic rhinitis.  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  She has been on chronic prednisone before in the past, currently on 20 mg daily.  She is on Protonix 40 mg twice daily, Singulair 10 mg, loratadine daily, gabapentin, Atrovent nasal spray as needed, Flonase daily. Formerly on Zantac. She was started on Advair since last visit.   She reports today that she has been at [facility] for rehab and tapered pred to as low as 5mg  qd. Dose currently 20mg    CPAP compliance 33% > 4 hours usage > has been broken for the last 2 weeks, working on getting it fixed   Review of Systems As per HPI     Objective:   Physical Exam Vitals:   12/12/18 1332  BP: 124/64  Pulse: 83  Temp: 98.1 F (36.7 C)  TempSrc: Oral  SpO2: 96%  Weight: 279 lb (126.6 kg)  Height: 5\' 4"  (1.626  m)    GEN: Obese woman in a wheelchair, no distress   HEENT:  OP clear, voice is strong, no nasal drip  NECK:  No stridor  RESP clear bilaterally, no wheezing on forced expiration  CARD: Regular, no murmur  Musco: No deformity  Neuro: Awake, alert, appropriate, no deficits  Skin: No rash       Assessment & Plan:  Upper airway cough syndrome Appears that this is been under fairly good control.  The biggest ramification of decreasing her prednisone appear to be weakness, symptoms consistent with adrenal insufficiency.  I will continue her on her  aggressive regimen for rhinitis, GERD.  Her most recent PFT were 5 years ago we should repeat these.  To date we have seen no evidence for clear obstructive disease and I would stop the Advair since it can potentially irritate her upper airway.  I think the prednisone needs to be tapered as low as possible but she may not be able to get to 5 mg which is when she had the weakness, etc.   Agree with continuing Protonix 40 mg twice a day, Singulair 10 mg each evening, loratadine 10 mg daily, Atrovent nasal spray as needed, Flonase nasal spray once daily, gabapentin as currently ordered Agree that you would benefit from being on the lowest possible dose of prednisone.  Burtis Junes trying to wean below 20 mg daily but based on your symptoms of adrenal insufficiency I do not believe you will be able to be on a dose as low as 5 mg daily.  Favor a repeat taper to determine the lowest effective dose. Please stop Advair for now. We will perform repeat pulmonary function testing and review them together Follow with Dr. Lamonte Sakai next available with full PFTs on the same day.  Obstructive sleep apnea Please work on restarting your CPAP as soon as it has been repaired.  Baltazar Apo, MD, PhD 12/12/2018, 2:02 PM [facility] [phone] or if no answer [phone]

## 2018-12-12 NOTE — Assessment & Plan Note (Signed)
Appears that this is been under fairly good control.  The biggest ramification of decreasing her prednisone appear to be weakness, symptoms consistent with adrenal insufficiency.  I will continue her on her aggressive regimen for rhinitis, GERD.  Her most recent PFT were 5 years ago we should repeat these.  To date we have seen no evidence for clear obstructive disease and I would stop the Advair since it can potentially irritate her upper airway.  I think the prednisone needs to be tapered as low as possible but she may not be able to get to 5 mg which is when she had the weakness, etc.   Agree with continuing Protonix 40 mg twice a day, Singulair 10 mg each evening, loratadine 10 mg daily, Atrovent nasal spray as needed, Flonase nasal spray once daily, gabapentin as currently ordered Agree that you would benefit from being on the lowest possible dose of prednisone.  Burtis Junes trying to wean below 20 mg daily but based on your symptoms of adrenal insufficiency I do not believe you will be able to be on a dose as low as 5 mg daily.  Favor a repeat taper to determine the lowest effective dose. Please stop Advair for now. We will perform repeat pulmonary function testing and review them together Follow with Dr. Lamonte Sakai next available with full PFTs on the same day.

## 2018-12-12 NOTE — Patient Instructions (Signed)
Please work on restarting your CPAP as soon as it has been repaired. Agree with continuing Protonix 40 mg twice a day, Singulair 10 mg each evening, loratadine 10 mg daily, Atrovent nasal spray as needed, Flonase nasal spray once daily, gabapentin as currently ordered Agree that you would benefit from being on the lowest possible dose of prednisone.  Burtis Junes trying to wean below 20 mg daily but based on your symptoms of adrenal insufficiency I do not believe you will be able to be on a dose as low as 5 mg daily.  Favor a repeat taper to determine the lowest effective dose. Please stop Advair for now. We will perform repeat pulmonary function testing and review them together Follow with Dr. Lamonte Sakai next available with full PFTs on the same day.

## 2018-12-12 NOTE — Assessment & Plan Note (Signed)
Please work on restarting your CPAP as soon as it has been repaired.

## 2018-12-12 NOTE — Addendum Note (Signed)
Addended by: Desmond Dike C on: 12/12/2018 02:10 PM   Modules accepted: Orders

## 2018-12-13 DIAGNOSIS — G959 Disease of spinal cord, unspecified: Secondary | ICD-10-CM | POA: Diagnosis not present

## 2018-12-13 DIAGNOSIS — M6281 Muscle weakness (generalized): Secondary | ICD-10-CM | POA: Diagnosis not present

## 2018-12-13 DIAGNOSIS — E875 Hyperkalemia: Secondary | ICD-10-CM | POA: Diagnosis not present

## 2018-12-14 DIAGNOSIS — G959 Disease of spinal cord, unspecified: Secondary | ICD-10-CM | POA: Diagnosis not present

## 2018-12-14 DIAGNOSIS — M6281 Muscle weakness (generalized): Secondary | ICD-10-CM | POA: Diagnosis not present

## 2018-12-15 DIAGNOSIS — G959 Disease of spinal cord, unspecified: Secondary | ICD-10-CM | POA: Diagnosis not present

## 2018-12-15 DIAGNOSIS — M6281 Muscle weakness (generalized): Secondary | ICD-10-CM | POA: Diagnosis not present

## 2018-12-16 DIAGNOSIS — G959 Disease of spinal cord, unspecified: Secondary | ICD-10-CM | POA: Diagnosis not present

## 2018-12-16 DIAGNOSIS — M6281 Muscle weakness (generalized): Secondary | ICD-10-CM | POA: Diagnosis not present

## 2018-12-17 DIAGNOSIS — G959 Disease of spinal cord, unspecified: Secondary | ICD-10-CM | POA: Diagnosis not present

## 2018-12-17 DIAGNOSIS — M6281 Muscle weakness (generalized): Secondary | ICD-10-CM | POA: Diagnosis not present

## 2018-12-19 DIAGNOSIS — E1159 Type 2 diabetes mellitus with other circulatory complications: Secondary | ICD-10-CM | POA: Diagnosis not present

## 2018-12-19 DIAGNOSIS — I10 Essential (primary) hypertension: Secondary | ICD-10-CM | POA: Diagnosis not present

## 2018-12-19 DIAGNOSIS — E1142 Type 2 diabetes mellitus with diabetic polyneuropathy: Secondary | ICD-10-CM | POA: Diagnosis not present

## 2018-12-19 DIAGNOSIS — E1165 Type 2 diabetes mellitus with hyperglycemia: Secondary | ICD-10-CM | POA: Diagnosis not present

## 2018-12-19 DIAGNOSIS — Z794 Long term (current) use of insulin: Secondary | ICD-10-CM | POA: Diagnosis not present

## 2018-12-20 DIAGNOSIS — G959 Disease of spinal cord, unspecified: Secondary | ICD-10-CM | POA: Diagnosis not present

## 2018-12-20 DIAGNOSIS — M6281 Muscle weakness (generalized): Secondary | ICD-10-CM | POA: Diagnosis not present

## 2018-12-21 DIAGNOSIS — G959 Disease of spinal cord, unspecified: Secondary | ICD-10-CM | POA: Diagnosis not present

## 2018-12-21 DIAGNOSIS — M6281 Muscle weakness (generalized): Secondary | ICD-10-CM | POA: Diagnosis not present

## 2018-12-22 DIAGNOSIS — G959 Disease of spinal cord, unspecified: Secondary | ICD-10-CM | POA: Diagnosis not present

## 2018-12-22 DIAGNOSIS — M6281 Muscle weakness (generalized): Secondary | ICD-10-CM | POA: Diagnosis not present

## 2018-12-23 DIAGNOSIS — M6281 Muscle weakness (generalized): Secondary | ICD-10-CM | POA: Diagnosis not present

## 2018-12-23 DIAGNOSIS — G959 Disease of spinal cord, unspecified: Secondary | ICD-10-CM | POA: Diagnosis not present

## 2018-12-24 DIAGNOSIS — G959 Disease of spinal cord, unspecified: Secondary | ICD-10-CM | POA: Diagnosis not present

## 2018-12-24 DIAGNOSIS — M6281 Muscle weakness (generalized): Secondary | ICD-10-CM | POA: Diagnosis not present

## 2018-12-25 DIAGNOSIS — M6281 Muscle weakness (generalized): Secondary | ICD-10-CM | POA: Diagnosis not present

## 2018-12-25 DIAGNOSIS — G959 Disease of spinal cord, unspecified: Secondary | ICD-10-CM | POA: Diagnosis not present

## 2018-12-27 DIAGNOSIS — G959 Disease of spinal cord, unspecified: Secondary | ICD-10-CM | POA: Diagnosis not present

## 2018-12-27 DIAGNOSIS — M6281 Muscle weakness (generalized): Secondary | ICD-10-CM | POA: Diagnosis not present

## 2018-12-28 ENCOUNTER — Telehealth: Payer: Self-pay | Admitting: Emergency Medicine

## 2018-12-28 DIAGNOSIS — D649 Anemia, unspecified: Secondary | ICD-10-CM | POA: Diagnosis not present

## 2018-12-28 DIAGNOSIS — K219 Gastro-esophageal reflux disease without esophagitis: Secondary | ICD-10-CM | POA: Diagnosis not present

## 2018-12-28 DIAGNOSIS — E1140 Type 2 diabetes mellitus with diabetic neuropathy, unspecified: Secondary | ICD-10-CM | POA: Diagnosis not present

## 2018-12-28 DIAGNOSIS — Z86718 Personal history of other venous thrombosis and embolism: Secondary | ICD-10-CM | POA: Diagnosis not present

## 2018-12-28 NOTE — Telephone Encounter (Signed)
Spoke with pt. Advised her that I do not see where anyone from our office called her. She did not have a message from anyone, just our number on her caller ID. Asked the pt if she needed anything from [place] while I had her on the phone. She declined. Nothing further was needed.

## 2018-12-29 DIAGNOSIS — G959 Disease of spinal cord, unspecified: Secondary | ICD-10-CM | POA: Diagnosis not present

## 2018-12-29 DIAGNOSIS — M6281 Muscle weakness (generalized): Secondary | ICD-10-CM | POA: Diagnosis not present

## 2018-12-30 DIAGNOSIS — Z136 Encounter for screening for cardiovascular disorders: Secondary | ICD-10-CM | POA: Diagnosis not present

## 2018-12-30 DIAGNOSIS — R609 Edema, unspecified: Secondary | ICD-10-CM | POA: Diagnosis not present

## 2018-12-30 DIAGNOSIS — M6281 Muscle weakness (generalized): Secondary | ICD-10-CM | POA: Diagnosis not present

## 2018-12-30 DIAGNOSIS — G959 Disease of spinal cord, unspecified: Secondary | ICD-10-CM | POA: Diagnosis not present

## 2018-12-30 DIAGNOSIS — M79604 Pain in right leg: Secondary | ICD-10-CM | POA: Diagnosis not present

## 2018-12-31 DIAGNOSIS — M6281 Muscle weakness (generalized): Secondary | ICD-10-CM | POA: Diagnosis not present

## 2018-12-31 DIAGNOSIS — G959 Disease of spinal cord, unspecified: Secondary | ICD-10-CM | POA: Diagnosis not present

## 2019-01-02 DIAGNOSIS — M6281 Muscle weakness (generalized): Secondary | ICD-10-CM | POA: Diagnosis not present

## 2019-01-02 DIAGNOSIS — G959 Disease of spinal cord, unspecified: Secondary | ICD-10-CM | POA: Diagnosis not present

## 2019-01-03 DIAGNOSIS — M6281 Muscle weakness (generalized): Secondary | ICD-10-CM | POA: Diagnosis not present

## 2019-01-03 DIAGNOSIS — G959 Disease of spinal cord, unspecified: Secondary | ICD-10-CM | POA: Diagnosis not present

## 2019-01-04 DIAGNOSIS — M6281 Muscle weakness (generalized): Secondary | ICD-10-CM | POA: Diagnosis not present

## 2019-01-04 DIAGNOSIS — G959 Disease of spinal cord, unspecified: Secondary | ICD-10-CM | POA: Diagnosis not present

## 2019-01-05 DIAGNOSIS — G959 Disease of spinal cord, unspecified: Secondary | ICD-10-CM | POA: Diagnosis not present

## 2019-01-05 DIAGNOSIS — M6281 Muscle weakness (generalized): Secondary | ICD-10-CM | POA: Diagnosis not present

## 2019-01-06 DIAGNOSIS — G959 Disease of spinal cord, unspecified: Secondary | ICD-10-CM | POA: Diagnosis not present

## 2019-01-06 DIAGNOSIS — M6281 Muscle weakness (generalized): Secondary | ICD-10-CM | POA: Diagnosis not present

## 2019-01-09 DIAGNOSIS — M6281 Muscle weakness (generalized): Secondary | ICD-10-CM | POA: Diagnosis not present

## 2019-01-09 DIAGNOSIS — G959 Disease of spinal cord, unspecified: Secondary | ICD-10-CM | POA: Diagnosis not present

## 2019-01-10 DIAGNOSIS — G959 Disease of spinal cord, unspecified: Secondary | ICD-10-CM | POA: Diagnosis not present

## 2019-01-10 DIAGNOSIS — M6281 Muscle weakness (generalized): Secondary | ICD-10-CM | POA: Diagnosis not present

## 2019-01-11 DIAGNOSIS — M6281 Muscle weakness (generalized): Secondary | ICD-10-CM | POA: Diagnosis not present

## 2019-01-11 DIAGNOSIS — G959 Disease of spinal cord, unspecified: Secondary | ICD-10-CM | POA: Diagnosis not present

## 2019-01-12 DIAGNOSIS — M6281 Muscle weakness (generalized): Secondary | ICD-10-CM | POA: Diagnosis not present

## 2019-01-12 DIAGNOSIS — G959 Disease of spinal cord, unspecified: Secondary | ICD-10-CM | POA: Diagnosis not present

## 2019-01-14 DIAGNOSIS — G959 Disease of spinal cord, unspecified: Secondary | ICD-10-CM | POA: Diagnosis not present

## 2019-01-14 DIAGNOSIS — M6281 Muscle weakness (generalized): Secondary | ICD-10-CM | POA: Diagnosis not present

## 2019-01-15 DIAGNOSIS — G959 Disease of spinal cord, unspecified: Secondary | ICD-10-CM | POA: Diagnosis not present

## 2019-01-15 DIAGNOSIS — M6281 Muscle weakness (generalized): Secondary | ICD-10-CM | POA: Diagnosis not present

## 2019-01-16 DIAGNOSIS — M6281 Muscle weakness (generalized): Secondary | ICD-10-CM | POA: Diagnosis not present

## 2019-01-16 DIAGNOSIS — G959 Disease of spinal cord, unspecified: Secondary | ICD-10-CM | POA: Diagnosis not present

## 2019-01-17 DIAGNOSIS — M6281 Muscle weakness (generalized): Secondary | ICD-10-CM | POA: Diagnosis not present

## 2019-01-17 DIAGNOSIS — G959 Disease of spinal cord, unspecified: Secondary | ICD-10-CM | POA: Diagnosis not present

## 2019-01-19 DIAGNOSIS — M6281 Muscle weakness (generalized): Secondary | ICD-10-CM | POA: Diagnosis not present

## 2019-01-19 DIAGNOSIS — M25511 Pain in right shoulder: Secondary | ICD-10-CM | POA: Diagnosis not present

## 2019-01-19 DIAGNOSIS — Z5329 Procedure and treatment not carried out because of patient's decision for other reasons: Secondary | ICD-10-CM | POA: Diagnosis not present

## 2019-01-19 DIAGNOSIS — G959 Disease of spinal cord, unspecified: Secondary | ICD-10-CM | POA: Diagnosis not present

## 2019-01-19 DIAGNOSIS — M19011 Primary osteoarthritis, right shoulder: Secondary | ICD-10-CM | POA: Diagnosis not present

## 2019-01-19 DIAGNOSIS — R079 Chest pain, unspecified: Secondary | ICD-10-CM | POA: Diagnosis not present

## 2019-01-21 DIAGNOSIS — G959 Disease of spinal cord, unspecified: Secondary | ICD-10-CM | POA: Diagnosis not present

## 2019-01-21 DIAGNOSIS — M6281 Muscle weakness (generalized): Secondary | ICD-10-CM | POA: Diagnosis not present

## 2019-01-23 ENCOUNTER — Other Ambulatory Visit: Payer: Self-pay | Admitting: Emergency Medicine

## 2019-01-23 DIAGNOSIS — M6281 Muscle weakness (generalized): Secondary | ICD-10-CM | POA: Diagnosis not present

## 2019-01-23 DIAGNOSIS — G959 Disease of spinal cord, unspecified: Secondary | ICD-10-CM | POA: Diagnosis not present

## 2019-01-24 DIAGNOSIS — M6281 Muscle weakness (generalized): Secondary | ICD-10-CM | POA: Diagnosis not present

## 2019-01-24 DIAGNOSIS — G959 Disease of spinal cord, unspecified: Secondary | ICD-10-CM | POA: Diagnosis not present

## 2019-01-25 DIAGNOSIS — G959 Disease of spinal cord, unspecified: Secondary | ICD-10-CM | POA: Diagnosis not present

## 2019-01-25 DIAGNOSIS — M6281 Muscle weakness (generalized): Secondary | ICD-10-CM | POA: Diagnosis not present

## 2019-01-26 DIAGNOSIS — G959 Disease of spinal cord, unspecified: Secondary | ICD-10-CM | POA: Diagnosis not present

## 2019-01-26 DIAGNOSIS — M6281 Muscle weakness (generalized): Secondary | ICD-10-CM | POA: Diagnosis not present

## 2019-01-27 ENCOUNTER — Other Ambulatory Visit (HOSPITAL_COMMUNITY)
Admission: RE | Admit: 2019-01-27 | Discharge: 2019-01-27 | Disposition: A | Discharge status: Home / Self Care | Payer: Medicare Other | Source: Ambulatory Visit | Attending: Emergency Medicine | Admitting: Emergency Medicine

## 2019-01-27 DIAGNOSIS — Z20828 Contact with and (suspected) exposure to other viral communicable diseases: Secondary | ICD-10-CM | POA: Insufficient documentation

## 2019-01-27 DIAGNOSIS — Z01812 Encounter for preprocedural laboratory examination: Secondary | ICD-10-CM | POA: Insufficient documentation

## 2019-01-27 LAB — SARS CORONAVIRUS 2 (TAT 6-24 HRS): SARS Coronavirus 2: NEGATIVE

## 2019-01-28 DIAGNOSIS — G959 Disease of spinal cord, unspecified: Secondary | ICD-10-CM | POA: Diagnosis not present

## 2019-01-28 DIAGNOSIS — M6281 Muscle weakness (generalized): Secondary | ICD-10-CM | POA: Diagnosis not present

## 2019-01-30 DIAGNOSIS — M6281 Muscle weakness (generalized): Secondary | ICD-10-CM | POA: Diagnosis not present

## 2019-01-30 DIAGNOSIS — G959 Disease of spinal cord, unspecified: Secondary | ICD-10-CM | POA: Diagnosis not present

## 2019-01-31 ENCOUNTER — Ambulatory Visit (INDEPENDENT_AMBULATORY_CARE_PROVIDER_SITE_OTHER): Payer: Medicare Other | Admitting: Emergency Medicine

## 2019-01-31 ENCOUNTER — Encounter: Payer: Self-pay | Admitting: Emergency Medicine

## 2019-01-31 ENCOUNTER — Other Ambulatory Visit: Payer: Self-pay

## 2019-01-31 DIAGNOSIS — R058 Other specified cough: Secondary | ICD-10-CM

## 2019-01-31 DIAGNOSIS — G4733 Obstructive sleep apnea (adult) (pediatric): Secondary | ICD-10-CM | POA: Diagnosis not present

## 2019-01-31 DIAGNOSIS — R0609 Other forms of dyspnea: Secondary | ICD-10-CM

## 2019-01-31 DIAGNOSIS — M6281 Muscle weakness (generalized): Secondary | ICD-10-CM | POA: Diagnosis not present

## 2019-01-31 DIAGNOSIS — R05 Cough: Secondary | ICD-10-CM | POA: Diagnosis not present

## 2019-01-31 DIAGNOSIS — G959 Disease of spinal cord, unspecified: Secondary | ICD-10-CM | POA: Diagnosis not present

## 2019-01-31 LAB — PULMONARY FUNCTION TEST
DL/VA % pred: 117 %
DL/VA: 4.85 ml/min/mmHg/L
DLCO unc % pred: 66 %
DLCO unc: 12.92 ml/min/mmHg
FEF 25-75 Post: 1.97 L/sec
FEF 25-75 Pre: 2.81 L/sec
FEF2575-%Change-Post: -29 %
FEF2575-%Pred-Post: 113 %
FEF2575-%Pred-Pre: 162 %
FEV1-%Change-Post: -9 %
FEV1-%Pred-Post: 75 %
FEV1-%Pred-Pre: 83 %
FEV1-Post: 1.4 L
FEV1-Pre: 1.55 L
FEV1FVC-%Change-Post: 1 %
FEV1FVC-%Pred-Pre: 120 %
FEV6-%Change-Post: -11 %
FEV6-%Pred-Post: 63 %
FEV6-%Pred-Pre: 72 %
FEV6-Post: 1.47 L
FEV6-Pre: 1.67 L
FEV6FVC-%Pred-Post: 104 %
FEV6FVC-%Pred-Pre: 104 %
FVC-%Change-Post: -10 %
FVC-%Pred-Post: 61 %
FVC-%Pred-Pre: 69 %
FVC-Post: 1.49 L
FVC-Pre: 1.67 L
Post FEV1/FVC ratio: 94 %
Post FEV6/FVC ratio: 100 %
Pre FEV1/FVC ratio: 93 %
Pre FEV6/FVC Ratio: 100 %

## 2019-01-31 NOTE — Assessment & Plan Note (Signed)
Good CPAP compliance, good clinical benefit.  Continue same

## 2019-01-31 NOTE — Progress Notes (Signed)
Subjective:    Patient ID: Christy May, female    DOB: [birth year], 68 y.o.   MRN: [id number]  HPI  ROV 12/12/2018 --Christy May is 89, former smoker with a history of obesity, hypertension, diabetes, childhood asthma.  I have followed her for chronic cough and upper airway irritation syndrome in the setting of GERD, allergic rhinitis.  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  She has been on chronic prednisone before in the past, currently on 20 mg daily.  She is on Protonix 40 mg twice daily, Singulair 10 mg, loratadine daily, gabapentin, Atrovent nasal spray as needed, Flonase daily. Formerly on Zantac. She was started on Advair since last visit.   She reports today that she has been at [facility] for rehab and tapered pred to as low as 5mg  qd. Dose currently 20mg    CPAP compliance 33% > 4 hours usage > has been broken for the last 2 weeks, working on getting it fixed  ROV 01/31/2019 --follow-up visit today for patient with a history of obesity, chronic cough and upper airway irritation exacerbated by GERD, allergic rhinitis.  This is been severe enough that she had been managed on continuous prednisone which has been tapered but only to 20 mg due to symptoms that were consistent with adrenal insufficiency.  Also with possible obstructive lung disease.  Unclear, but she did have childhood asthma.  At her last visit she had been started on Advair which I stopped as a potential irritant to her upper airway.  We repeated pulmonary function testing today to try to quantify obstruction.  I have reviewed, these show principally restriction on her spirometry. Her lung volumes were done incorrectly.  Diffusion capacity was decreased and corrected to the normal range when adjusted for her alveolar volume. She is still using protonix bid, singulair, loratadine, atrovent NS, flonase, gabapentin.    Review of Systems As per HPI     Objective:   Physical Exam Vitals:   01/31/19 1216  BP: 110/70  Pulse: 92  SpO2: 94%  Weight: 293 lb (132.9 kg)  Height: 5\' 4"  (1.626 m)    GEN: Obese woman in a wheelchair, no distress   HEENT:  OP clear, voice is strong, no nasal drip  NECK:  No stridor  RESP clear bilaterally, no wheezing on forced expiration  CARD: Regular, no murmur  Musco: No deformity  Neuro: Awake, alert, appropriate, no deficits  Skin: No rash       Assessment & Plan:  Dyspnea on exertion Dyspnea that is largely related to restrictive lung disease.  There was no obvious obstruction on her spirometry done today.  Her lung volumes were spurious, and appropriate nitrogen washout and inconsistent with her other findings.  I think her asthmatic symptoms are best described upper airway irritation.  We need to try to wean her prednisone.  She had other symptoms that have flared when it has been decreased, chronic pain, weakness, question adrenal insufficiency.  I will decrease slowly over weeks to months.  She may not be able to come off completely, may have to continue 5 to 10 mg daily.  Your breathing test today did not show significant evidence for asthma.  This is good news.  We will not restart Advair at this time. Please decrease prednisone to 15 mg daily until next visit.  At that time we will assess your symptoms and decide whether we can continue to slowly decrease.  It is  not clear that we will be able to get you all the way off this medication based on the symptoms that you developed when we tried in the past. Please continue your Protonix twice a day, Singulair each evening, loratadine once a day, Atrovent nasal spray, Flonase nasal spray, gabapentin as they are ordered. Follow with Dr Lamonte Sakai in 1 month with either Dr. Lamonte Sakai or APP to discuss slowly decreasing your prednisone further.  Upper airway cough syndrome Continue her current regimen for GERD and allergic rhinitis  Obstructive sleep apnea Good CPAP compliance, good clinical  benefit.  Continue same  Baltazar Apo, MD, PhD 01/31/2019, 1:33 PM [facility] [phone] or if no answer [phone]

## 2019-01-31 NOTE — Patient Instructions (Addendum)
Your breathing test today did not show significant evidence for asthma.  This is good news.  We will not restart Advair at this time. Please decrease prednisone to 15 mg daily until next visit.  At that time we will assess your symptoms and decide whether we can continue to slowly decrease.  It is not clear that we will be able to get you all the way off this medication based on the symptoms that you developed when we tried in the past. Please continue your Protonix twice a day, Singulair each evening, loratadine once a day, Atrovent nasal spray, Flonase nasal spray, gabapentin as they are ordered. Continue to wear your CPAP every night Follow with Dr Lamonte Sakai in 1 month with either Dr. Lamonte Sakai or APP to discuss slowly decreasing your prednisone further.

## 2019-01-31 NOTE — Progress Notes (Signed)
Full PFT performed today. N2 washout performed instead of Pleth due to patient mobility. Pt wheelchair bound.

## 2019-01-31 NOTE — Assessment & Plan Note (Signed)
Dyspnea that is largely related to restrictive lung disease.  There was no obvious obstruction on her spirometry done today.  Her lung volumes were spurious, and appropriate nitrogen washout and inconsistent with her other findings.  I think her asthmatic symptoms are best described upper airway irritation.  We need to try to wean her prednisone.  She had other symptoms that have flared when it has been decreased, chronic pain, weakness, question adrenal insufficiency.  I will decrease slowly over weeks to months.  She may not be able to come off completely, may have to continue 5 to 10 mg daily.  Your breathing test today did not show significant evidence for asthma.  This is good news.  We will not restart Advair at this time. Please decrease prednisone to 15 mg daily until next visit.  At that time we will assess your symptoms and decide whether we can continue to slowly decrease.  It is not clear that we will be able to get you all the way off this medication based on the symptoms that you developed when we tried in the past. Please continue your Protonix twice a day, Singulair each evening, loratadine once a day, Atrovent nasal spray, Flonase nasal spray, gabapentin as they are ordered. Follow with Dr Lamonte Sakai in 1 month with either Dr. Lamonte Sakai or APP to discuss slowly decreasing your prednisone further.

## 2019-01-31 NOTE — Assessment & Plan Note (Signed)
Continue her current regimen for GERD and allergic rhinitis

## 2019-02-01 DIAGNOSIS — G959 Disease of spinal cord, unspecified: Secondary | ICD-10-CM | POA: Diagnosis not present

## 2019-02-01 DIAGNOSIS — H10509 Unspecified blepharoconjunctivitis, unspecified eye: Secondary | ICD-10-CM | POA: Diagnosis not present

## 2019-02-01 DIAGNOSIS — M6281 Muscle weakness (generalized): Secondary | ICD-10-CM | POA: Diagnosis not present

## 2019-02-02 DIAGNOSIS — G959 Disease of spinal cord, unspecified: Secondary | ICD-10-CM | POA: Diagnosis not present

## 2019-02-02 DIAGNOSIS — M6281 Muscle weakness (generalized): Secondary | ICD-10-CM | POA: Diagnosis not present

## 2019-02-03 ENCOUNTER — Encounter: Payer: Self-pay | Admitting: Gastroenterology

## 2019-02-03 DIAGNOSIS — M6281 Muscle weakness (generalized): Secondary | ICD-10-CM | POA: Diagnosis not present

## 2019-02-03 DIAGNOSIS — G959 Disease of spinal cord, unspecified: Secondary | ICD-10-CM | POA: Diagnosis not present

## 2019-02-04 DIAGNOSIS — G959 Disease of spinal cord, unspecified: Secondary | ICD-10-CM | POA: Diagnosis not present

## 2019-02-04 DIAGNOSIS — M6281 Muscle weakness (generalized): Secondary | ICD-10-CM | POA: Diagnosis not present

## 2019-02-05 DIAGNOSIS — M6281 Muscle weakness (generalized): Secondary | ICD-10-CM | POA: Diagnosis not present

## 2019-02-05 DIAGNOSIS — G959 Disease of spinal cord, unspecified: Secondary | ICD-10-CM | POA: Diagnosis not present

## 2019-02-07 DIAGNOSIS — G959 Disease of spinal cord, unspecified: Secondary | ICD-10-CM | POA: Diagnosis not present

## 2019-02-07 DIAGNOSIS — M6281 Muscle weakness (generalized): Secondary | ICD-10-CM | POA: Diagnosis not present

## 2019-02-08 DIAGNOSIS — G959 Disease of spinal cord, unspecified: Secondary | ICD-10-CM | POA: Diagnosis not present

## 2019-02-08 DIAGNOSIS — M6281 Muscle weakness (generalized): Secondary | ICD-10-CM | POA: Diagnosis not present

## 2019-02-09 DIAGNOSIS — I10 Essential (primary) hypertension: Secondary | ICD-10-CM | POA: Diagnosis not present

## 2019-02-09 DIAGNOSIS — M6281 Muscle weakness (generalized): Secondary | ICD-10-CM | POA: Diagnosis not present

## 2019-02-09 DIAGNOSIS — I110 Hypertensive heart disease with heart failure: Secondary | ICD-10-CM | POA: Diagnosis not present

## 2019-02-09 DIAGNOSIS — E1140 Type 2 diabetes mellitus with diabetic neuropathy, unspecified: Secondary | ICD-10-CM | POA: Diagnosis not present

## 2019-02-09 DIAGNOSIS — E0822 Diabetes mellitus due to underlying condition with diabetic chronic kidney disease: Secondary | ICD-10-CM | POA: Diagnosis not present

## 2019-02-09 DIAGNOSIS — G4733 Obstructive sleep apnea (adult) (pediatric): Secondary | ICD-10-CM | POA: Diagnosis not present

## 2019-02-09 DIAGNOSIS — G959 Disease of spinal cord, unspecified: Secondary | ICD-10-CM | POA: Diagnosis not present

## 2019-02-09 DIAGNOSIS — Z7952 Long term (current) use of systemic steroids: Secondary | ICD-10-CM | POA: Diagnosis not present

## 2019-02-09 DIAGNOSIS — N183 Chronic kidney disease, stage 3 (moderate): Secondary | ICD-10-CM | POA: Diagnosis not present

## 2019-02-11 DIAGNOSIS — G959 Disease of spinal cord, unspecified: Secondary | ICD-10-CM | POA: Diagnosis not present

## 2019-02-11 DIAGNOSIS — M6281 Muscle weakness (generalized): Secondary | ICD-10-CM | POA: Diagnosis not present

## 2019-02-13 DIAGNOSIS — G959 Disease of spinal cord, unspecified: Secondary | ICD-10-CM | POA: Diagnosis not present

## 2019-02-13 DIAGNOSIS — M6281 Muscle weakness (generalized): Secondary | ICD-10-CM | POA: Diagnosis not present

## 2019-02-14 DIAGNOSIS — M6281 Muscle weakness (generalized): Secondary | ICD-10-CM | POA: Diagnosis not present

## 2019-02-14 DIAGNOSIS — G959 Disease of spinal cord, unspecified: Secondary | ICD-10-CM | POA: Diagnosis not present

## 2019-02-15 DIAGNOSIS — G959 Disease of spinal cord, unspecified: Secondary | ICD-10-CM | POA: Diagnosis not present

## 2019-02-15 DIAGNOSIS — M6281 Muscle weakness (generalized): Secondary | ICD-10-CM | POA: Diagnosis not present

## 2019-02-16 DIAGNOSIS — G959 Disease of spinal cord, unspecified: Secondary | ICD-10-CM | POA: Diagnosis not present

## 2019-02-16 DIAGNOSIS — M6281 Muscle weakness (generalized): Secondary | ICD-10-CM | POA: Diagnosis not present

## 2019-02-20 DIAGNOSIS — G959 Disease of spinal cord, unspecified: Secondary | ICD-10-CM | POA: Diagnosis not present

## 2019-02-20 DIAGNOSIS — M6281 Muscle weakness (generalized): Secondary | ICD-10-CM | POA: Diagnosis not present

## 2019-02-20 DIAGNOSIS — I10 Essential (primary) hypertension: Secondary | ICD-10-CM | POA: Diagnosis not present

## 2019-02-20 DIAGNOSIS — Z794 Long term (current) use of insulin: Secondary | ICD-10-CM | POA: Diagnosis not present

## 2019-02-20 DIAGNOSIS — E1142 Type 2 diabetes mellitus with diabetic polyneuropathy: Secondary | ICD-10-CM | POA: Diagnosis not present

## 2019-02-20 DIAGNOSIS — E1159 Type 2 diabetes mellitus with other circulatory complications: Secondary | ICD-10-CM | POA: Diagnosis not present

## 2019-02-20 DIAGNOSIS — E1165 Type 2 diabetes mellitus with hyperglycemia: Secondary | ICD-10-CM | POA: Diagnosis not present

## 2019-02-21 DIAGNOSIS — M6281 Muscle weakness (generalized): Secondary | ICD-10-CM | POA: Diagnosis not present

## 2019-02-21 DIAGNOSIS — G959 Disease of spinal cord, unspecified: Secondary | ICD-10-CM | POA: Diagnosis not present

## 2019-02-22 DIAGNOSIS — E1140 Type 2 diabetes mellitus with diabetic neuropathy, unspecified: Secondary | ICD-10-CM | POA: Diagnosis not present

## 2019-02-22 DIAGNOSIS — M6281 Muscle weakness (generalized): Secondary | ICD-10-CM | POA: Diagnosis not present

## 2019-02-22 DIAGNOSIS — L84 Corns and callosities: Secondary | ICD-10-CM | POA: Diagnosis not present

## 2019-02-22 DIAGNOSIS — G959 Disease of spinal cord, unspecified: Secondary | ICD-10-CM | POA: Diagnosis not present

## 2019-02-22 DIAGNOSIS — L603 Nail dystrophy: Secondary | ICD-10-CM | POA: Diagnosis not present

## 2019-02-22 DIAGNOSIS — M2041 Other hammer toe(s) (acquired), right foot: Secondary | ICD-10-CM | POA: Diagnosis not present

## 2019-02-22 DIAGNOSIS — E1151 Type 2 diabetes mellitus with diabetic peripheral angiopathy without gangrene: Secondary | ICD-10-CM | POA: Diagnosis not present

## 2019-02-22 DIAGNOSIS — Z7984 Long term (current) use of oral hypoglycemic drugs: Secondary | ICD-10-CM | POA: Diagnosis not present

## 2019-02-22 DIAGNOSIS — B351 Tinea unguium: Secondary | ICD-10-CM | POA: Diagnosis not present

## 2019-02-23 DIAGNOSIS — G959 Disease of spinal cord, unspecified: Secondary | ICD-10-CM | POA: Diagnosis not present

## 2019-02-23 DIAGNOSIS — M6281 Muscle weakness (generalized): Secondary | ICD-10-CM | POA: Diagnosis not present

## 2019-02-27 DIAGNOSIS — M6281 Muscle weakness (generalized): Secondary | ICD-10-CM | POA: Diagnosis not present

## 2019-02-27 DIAGNOSIS — G959 Disease of spinal cord, unspecified: Secondary | ICD-10-CM | POA: Diagnosis not present

## 2019-02-28 DIAGNOSIS — G959 Disease of spinal cord, unspecified: Secondary | ICD-10-CM | POA: Diagnosis not present

## 2019-02-28 DIAGNOSIS — M6281 Muscle weakness (generalized): Secondary | ICD-10-CM | POA: Diagnosis not present

## 2019-03-01 DIAGNOSIS — M6281 Muscle weakness (generalized): Secondary | ICD-10-CM | POA: Diagnosis not present

## 2019-03-01 DIAGNOSIS — G959 Disease of spinal cord, unspecified: Secondary | ICD-10-CM | POA: Diagnosis not present

## 2019-03-01 DIAGNOSIS — W19XXXA Unspecified fall, initial encounter: Secondary | ICD-10-CM | POA: Diagnosis not present

## 2019-03-01 DIAGNOSIS — M542 Cervicalgia: Secondary | ICD-10-CM | POA: Diagnosis not present

## 2019-03-02 DIAGNOSIS — M6281 Muscle weakness (generalized): Secondary | ICD-10-CM | POA: Diagnosis not present

## 2019-03-02 DIAGNOSIS — G959 Disease of spinal cord, unspecified: Secondary | ICD-10-CM | POA: Diagnosis not present

## 2019-03-06 DIAGNOSIS — M6281 Muscle weakness (generalized): Secondary | ICD-10-CM | POA: Diagnosis not present

## 2019-03-06 DIAGNOSIS — G959 Disease of spinal cord, unspecified: Secondary | ICD-10-CM | POA: Diagnosis not present

## 2019-03-07 ENCOUNTER — Ambulatory Visit: Payer: Medicare Other | Admitting: Emergency Medicine

## 2019-03-07 DIAGNOSIS — U071 COVID-19: Secondary | ICD-10-CM | POA: Diagnosis not present

## 2019-03-07 DIAGNOSIS — G959 Disease of spinal cord, unspecified: Secondary | ICD-10-CM | POA: Diagnosis not present

## 2019-03-07 DIAGNOSIS — M6281 Muscle weakness (generalized): Secondary | ICD-10-CM | POA: Diagnosis not present

## 2019-03-08 DIAGNOSIS — G959 Disease of spinal cord, unspecified: Secondary | ICD-10-CM | POA: Diagnosis not present

## 2019-03-08 DIAGNOSIS — M6281 Muscle weakness (generalized): Secondary | ICD-10-CM | POA: Diagnosis not present

## 2019-03-09 DIAGNOSIS — M6281 Muscle weakness (generalized): Secondary | ICD-10-CM | POA: Diagnosis not present

## 2019-03-09 DIAGNOSIS — G959 Disease of spinal cord, unspecified: Secondary | ICD-10-CM | POA: Diagnosis not present

## 2019-03-10 DIAGNOSIS — M6281 Muscle weakness (generalized): Secondary | ICD-10-CM | POA: Diagnosis not present

## 2019-03-10 DIAGNOSIS — G959 Disease of spinal cord, unspecified: Secondary | ICD-10-CM | POA: Diagnosis not present

## 2019-03-11 DIAGNOSIS — G959 Disease of spinal cord, unspecified: Secondary | ICD-10-CM | POA: Diagnosis not present

## 2019-03-11 DIAGNOSIS — M6281 Muscle weakness (generalized): Secondary | ICD-10-CM | POA: Diagnosis not present

## 2019-03-13 DIAGNOSIS — U071 COVID-19: Secondary | ICD-10-CM | POA: Diagnosis not present

## 2019-03-13 DIAGNOSIS — M6281 Muscle weakness (generalized): Secondary | ICD-10-CM | POA: Diagnosis not present

## 2019-03-13 DIAGNOSIS — G959 Disease of spinal cord, unspecified: Secondary | ICD-10-CM | POA: Diagnosis not present

## 2019-03-14 DIAGNOSIS — M6281 Muscle weakness (generalized): Secondary | ICD-10-CM | POA: Diagnosis not present

## 2019-03-14 DIAGNOSIS — G959 Disease of spinal cord, unspecified: Secondary | ICD-10-CM | POA: Diagnosis not present

## 2019-03-15 DIAGNOSIS — G959 Disease of spinal cord, unspecified: Secondary | ICD-10-CM | POA: Diagnosis not present

## 2019-03-15 DIAGNOSIS — M6281 Muscle weakness (generalized): Secondary | ICD-10-CM | POA: Diagnosis not present

## 2019-03-16 DIAGNOSIS — M6281 Muscle weakness (generalized): Secondary | ICD-10-CM | POA: Diagnosis not present

## 2019-03-16 DIAGNOSIS — G959 Disease of spinal cord, unspecified: Secondary | ICD-10-CM | POA: Diagnosis not present

## 2019-03-17 ENCOUNTER — Encounter: Payer: Self-pay | Admitting: Emergency Medicine

## 2019-03-17 ENCOUNTER — Ambulatory Visit (INDEPENDENT_AMBULATORY_CARE_PROVIDER_SITE_OTHER): Payer: Medicare Other | Admitting: Emergency Medicine

## 2019-03-17 ENCOUNTER — Other Ambulatory Visit: Payer: Self-pay

## 2019-03-17 ENCOUNTER — Telehealth: Payer: Self-pay | Admitting: Emergency Medicine

## 2019-03-17 DIAGNOSIS — K219 Gastro-esophageal reflux disease without esophagitis: Secondary | ICD-10-CM | POA: Diagnosis not present

## 2019-03-17 DIAGNOSIS — R058 Other specified cough: Secondary | ICD-10-CM

## 2019-03-17 DIAGNOSIS — G4733 Obstructive sleep apnea (adult) (pediatric): Secondary | ICD-10-CM

## 2019-03-17 DIAGNOSIS — M6281 Muscle weakness (generalized): Secondary | ICD-10-CM | POA: Diagnosis not present

## 2019-03-17 DIAGNOSIS — G959 Disease of spinal cord, unspecified: Secondary | ICD-10-CM | POA: Diagnosis not present

## 2019-03-17 DIAGNOSIS — R05 Cough: Secondary | ICD-10-CM | POA: Diagnosis not present

## 2019-03-17 DIAGNOSIS — J301 Allergic rhinitis due to pollen: Secondary | ICD-10-CM | POA: Diagnosis not present

## 2019-03-17 NOTE — Progress Notes (Signed)
Subjective:    Patient ID: Christy May, female    DOB: October 02, 1950, 68 y.o.   MRN: [id number]  HPI  ROV 01/31/2019 --follow-up visit today for patient with a history of obesity, chronic cough and upper airway irritation exacerbated by GERD, allergic rhinitis.  This is been severe enough that she had been managed on continuous prednisone which has been tapered but only to 20 mg due to symptoms that were consistent with adrenal insufficiency.  Also with possible obstructive lung disease.  Unclear, but she did have childhood asthma.  At her last visit she had been started on Advair which I stopped as a potential irritant to her upper airway.  We repeated pulmonary function testing today to try to quantify obstruction.  I have reviewed, these show principally restriction on her spirometry. Her lung volumes were done incorrectly.  Diffusion capacity was decreased and corrected to the normal range when adjusted for her alveolar volume. She is still using protonix bid, singulair, loratadine, atrovent NS, flonase, gabapentin.   68 year old obese woman with chronic cough and upper airway irritation syndrome.  She has GERD and allergic rhinitis both of which act as irritants.  Also with obstructive sleep apnea on CPAP.  She has been treated with chronic prednisone to try and maintain control, tapering has been difficult due to symptoms consistent with adrenal insufficiency.  Her pulmonary function testing is most consistent with restriction.  At her last visit I decreased the prednisone to 69 mg daily.  Remains on Protonix twice daily, Singulair, loratadine, Flonase,  and gabapentin to manage her upper airway irritation. She reports that she is having some increased chest symptoms, feels some burning in the am when she wakes up. Maybe some increased chest mucous. She is having more joint pain, less energy.    Review of Systems As per HPI     Objective:   Physical Exam Vitals:   03/17/19 1124  BP:  116/68  Pulse: 92  SpO2: 94%  Weight: 293 lb (132.9 kg)  Height: 5' 4.25" (1.632 m)   Gen: Pleasant, well-nourished, in no distress,  normal affect  ENT: No lesions,  mouth clear,  oropharynx clear, no postnasal drip  Neck: No JVD, no stridor  Lungs: No use of accessory muscles, no crackles or wheezing on normal respiration, no wheeze on forced expiration  Cardiovascular: RRR, heart sounds normal, no murmur or gallops, no peripheral edema  Musculoskeletal: No deformities, no cyanosis or clubbing  Neuro: alert, awake, non focal  Skin: Warm, no lesions or rash      Assessment & Plan:  Obstructive sleep apnea Tolerating CPAP, good compliance.  Good clinical benefit.  Plan to continue  Allergic rhinitis She may have some slight increase in chest mucus on lower dose prednisone.  Overall her regimen is stable.  Increase her prednisone as below  Please increase your prednisone back to 20 mg daily Please continue Singulair, loratadine, fluticasone nasal spray, gabapentin as you have been taking them. Follow with Dr Lamonte Sakai in 6 months or sooner if you have any problems  GERD Based on her history she may be having some breakthrough GERD.  She is on PPI twice daily.  Certainly obesity contributing, likely prednisone contributing.  I tried to wean the prednisone but unsuccessfully.  Question whether she may require further gastroenterology evaluation  Upper airway cough syndrome She is having worsening symptoms, some of these sound like adrenal insufficiency which has happened before.  I will go back up on the prednisone  from 15 to 20 mg.  May be able to start to decrease more slowly at some point in the future.  Continue to manage GERD and rhinitis as able  Baltazar Apo, MD, PhD 03/17/2019, 11:41 AM [facility] [phone] or if no answer [phone]

## 2019-03-17 NOTE — Assessment & Plan Note (Signed)
Based on her history she may be having some breakthrough GERD.  She is on PPI twice daily.  Certainly obesity contributing, likely prednisone contributing.  I tried to wean the prednisone but unsuccessfully.  Question whether she may require further gastroenterology evaluation

## 2019-03-17 NOTE — Assessment & Plan Note (Signed)
She may have some slight increase in chest mucus on lower dose prednisone.  Overall her regimen is stable.  Increase her prednisone as below  Please increase your prednisone back to 20 mg daily Please continue Singulair, loratadine, fluticasone nasal spray, gabapentin as you have been taking them. Follow with Dr Lamonte Sakai in 6 months or sooner if you have any problems

## 2019-03-17 NOTE — Assessment & Plan Note (Signed)
She is having worsening symptoms, some of these sound like adrenal insufficiency which has happened before.  I will go back up on the prednisone from 15 to 20 mg.  May be able to start to decrease more slowly at some point in the future.  Continue to manage GERD and rhinitis as able

## 2019-03-17 NOTE — Assessment & Plan Note (Signed)
Tolerating CPAP, good compliance.  Good clinical benefit.  Plan to continue

## 2019-03-17 NOTE — Patient Instructions (Addendum)
Please increase your prednisone back to 20 mg daily Please continue Singulair, loratadine, fluticasone nasal spray, gabapentin as you have been taking them. Please continue Protonix twice a day Continue CPAP every night Follow with Dr Lamonte Sakai in 6 months or sooner if you have any problems

## 2019-03-17 NOTE — Telephone Encounter (Addendum)
Called and spoke with pt. Pt is questioning if she has COPD. I advised her that based on her past medical history in her problem list and her associated diagnoses in her past OV's, COPD was not mentioned. Advised pt to communicate her questions to Dr. Lamonte Sakai at her next follow up visit. Pt verbalized understanding and denied any further questions or concerns at this time.

## 2019-03-20 DIAGNOSIS — M6281 Muscle weakness (generalized): Secondary | ICD-10-CM | POA: Diagnosis not present

## 2019-03-20 DIAGNOSIS — G959 Disease of spinal cord, unspecified: Secondary | ICD-10-CM | POA: Diagnosis not present

## 2019-03-21 DIAGNOSIS — G959 Disease of spinal cord, unspecified: Secondary | ICD-10-CM | POA: Diagnosis not present

## 2019-03-21 DIAGNOSIS — M6281 Muscle weakness (generalized): Secondary | ICD-10-CM | POA: Diagnosis not present

## 2019-03-22 DIAGNOSIS — Z7952 Long term (current) use of systemic steroids: Secondary | ICD-10-CM | POA: Diagnosis not present

## 2019-03-22 DIAGNOSIS — G959 Disease of spinal cord, unspecified: Secondary | ICD-10-CM | POA: Diagnosis not present

## 2019-03-22 DIAGNOSIS — R0789 Other chest pain: Secondary | ICD-10-CM | POA: Diagnosis not present

## 2019-03-22 DIAGNOSIS — E559 Vitamin D deficiency, unspecified: Secondary | ICD-10-CM | POA: Diagnosis not present

## 2019-03-22 DIAGNOSIS — M6281 Muscle weakness (generalized): Secondary | ICD-10-CM | POA: Diagnosis not present

## 2019-03-22 DIAGNOSIS — Z79899 Other long term (current) drug therapy: Secondary | ICD-10-CM | POA: Diagnosis not present

## 2019-03-23 DIAGNOSIS — M4714 Other spondylosis with myelopathy, thoracic region: Secondary | ICD-10-CM | POA: Diagnosis not present

## 2019-03-23 DIAGNOSIS — M4323 Fusion of spine, cervicothoracic region: Secondary | ICD-10-CM | POA: Diagnosis not present

## 2019-03-23 DIAGNOSIS — G959 Disease of spinal cord, unspecified: Secondary | ICD-10-CM | POA: Diagnosis not present

## 2019-03-23 DIAGNOSIS — M6281 Muscle weakness (generalized): Secondary | ICD-10-CM | POA: Diagnosis not present

## 2019-03-27 DIAGNOSIS — G959 Disease of spinal cord, unspecified: Secondary | ICD-10-CM | POA: Diagnosis not present

## 2019-03-27 DIAGNOSIS — M6281 Muscle weakness (generalized): Secondary | ICD-10-CM | POA: Diagnosis not present

## 2019-03-27 DIAGNOSIS — M4323 Fusion of spine, cervicothoracic region: Secondary | ICD-10-CM | POA: Diagnosis not present

## 2019-03-27 DIAGNOSIS — M4714 Other spondylosis with myelopathy, thoracic region: Secondary | ICD-10-CM | POA: Diagnosis not present

## 2019-03-28 DIAGNOSIS — M4714 Other spondylosis with myelopathy, thoracic region: Secondary | ICD-10-CM | POA: Diagnosis not present

## 2019-03-28 DIAGNOSIS — G959 Disease of spinal cord, unspecified: Secondary | ICD-10-CM | POA: Diagnosis not present

## 2019-03-28 DIAGNOSIS — M6281 Muscle weakness (generalized): Secondary | ICD-10-CM | POA: Diagnosis not present

## 2019-03-28 DIAGNOSIS — M4323 Fusion of spine, cervicothoracic region: Secondary | ICD-10-CM | POA: Diagnosis not present

## 2019-03-30 DIAGNOSIS — M4714 Other spondylosis with myelopathy, thoracic region: Secondary | ICD-10-CM | POA: Diagnosis not present

## 2019-03-30 DIAGNOSIS — M4323 Fusion of spine, cervicothoracic region: Secondary | ICD-10-CM | POA: Diagnosis not present

## 2019-03-30 DIAGNOSIS — G959 Disease of spinal cord, unspecified: Secondary | ICD-10-CM | POA: Diagnosis not present

## 2019-03-30 DIAGNOSIS — M6281 Muscle weakness (generalized): Secondary | ICD-10-CM | POA: Diagnosis not present

## 2019-04-04 DIAGNOSIS — M4323 Fusion of spine, cervicothoracic region: Secondary | ICD-10-CM | POA: Diagnosis not present

## 2019-04-04 DIAGNOSIS — M4714 Other spondylosis with myelopathy, thoracic region: Secondary | ICD-10-CM | POA: Diagnosis not present

## 2019-04-04 DIAGNOSIS — M6281 Muscle weakness (generalized): Secondary | ICD-10-CM | POA: Diagnosis not present

## 2019-04-04 DIAGNOSIS — G959 Disease of spinal cord, unspecified: Secondary | ICD-10-CM | POA: Diagnosis not present

## 2019-04-05 DIAGNOSIS — M6281 Muscle weakness (generalized): Secondary | ICD-10-CM | POA: Diagnosis not present

## 2019-04-05 DIAGNOSIS — M4323 Fusion of spine, cervicothoracic region: Secondary | ICD-10-CM | POA: Diagnosis not present

## 2019-04-05 DIAGNOSIS — M4714 Other spondylosis with myelopathy, thoracic region: Secondary | ICD-10-CM | POA: Diagnosis not present

## 2019-04-05 DIAGNOSIS — G959 Disease of spinal cord, unspecified: Secondary | ICD-10-CM | POA: Diagnosis not present

## 2019-04-05 DIAGNOSIS — R0789 Other chest pain: Secondary | ICD-10-CM | POA: Diagnosis not present

## 2019-04-05 DIAGNOSIS — I10 Essential (primary) hypertension: Secondary | ICD-10-CM | POA: Diagnosis not present

## 2019-04-06 DIAGNOSIS — G959 Disease of spinal cord, unspecified: Secondary | ICD-10-CM | POA: Diagnosis not present

## 2019-04-06 DIAGNOSIS — M6281 Muscle weakness (generalized): Secondary | ICD-10-CM | POA: Diagnosis not present

## 2019-04-06 DIAGNOSIS — M4323 Fusion of spine, cervicothoracic region: Secondary | ICD-10-CM | POA: Diagnosis not present

## 2019-04-06 DIAGNOSIS — M4714 Other spondylosis with myelopathy, thoracic region: Secondary | ICD-10-CM | POA: Diagnosis not present

## 2019-04-08 DIAGNOSIS — G959 Disease of spinal cord, unspecified: Secondary | ICD-10-CM | POA: Diagnosis not present

## 2019-04-08 DIAGNOSIS — M4714 Other spondylosis with myelopathy, thoracic region: Secondary | ICD-10-CM | POA: Diagnosis not present

## 2019-04-08 DIAGNOSIS — M4323 Fusion of spine, cervicothoracic region: Secondary | ICD-10-CM | POA: Diagnosis not present

## 2019-04-08 DIAGNOSIS — M6281 Muscle weakness (generalized): Secondary | ICD-10-CM | POA: Diagnosis not present

## 2019-04-10 DIAGNOSIS — M4714 Other spondylosis with myelopathy, thoracic region: Secondary | ICD-10-CM | POA: Diagnosis not present

## 2019-04-10 DIAGNOSIS — M6281 Muscle weakness (generalized): Secondary | ICD-10-CM | POA: Diagnosis not present

## 2019-04-10 DIAGNOSIS — M4323 Fusion of spine, cervicothoracic region: Secondary | ICD-10-CM | POA: Diagnosis not present

## 2019-04-10 DIAGNOSIS — G959 Disease of spinal cord, unspecified: Secondary | ICD-10-CM | POA: Diagnosis not present

## 2019-04-11 ENCOUNTER — Other Ambulatory Visit: Payer: Self-pay

## 2019-04-11 DIAGNOSIS — M4714 Other spondylosis with myelopathy, thoracic region: Secondary | ICD-10-CM | POA: Diagnosis not present

## 2019-04-11 DIAGNOSIS — G959 Disease of spinal cord, unspecified: Secondary | ICD-10-CM | POA: Diagnosis not present

## 2019-04-11 DIAGNOSIS — M6281 Muscle weakness (generalized): Secondary | ICD-10-CM | POA: Diagnosis not present

## 2019-04-11 DIAGNOSIS — M4323 Fusion of spine, cervicothoracic region: Secondary | ICD-10-CM | POA: Diagnosis not present

## 2019-04-12 DIAGNOSIS — M6281 Muscle weakness (generalized): Secondary | ICD-10-CM | POA: Diagnosis not present

## 2019-04-12 DIAGNOSIS — M4323 Fusion of spine, cervicothoracic region: Secondary | ICD-10-CM | POA: Diagnosis not present

## 2019-04-12 DIAGNOSIS — M4714 Other spondylosis with myelopathy, thoracic region: Secondary | ICD-10-CM | POA: Diagnosis not present

## 2019-04-12 DIAGNOSIS — G959 Disease of spinal cord, unspecified: Secondary | ICD-10-CM | POA: Diagnosis not present

## 2019-04-13 ENCOUNTER — Other Ambulatory Visit: Payer: Self-pay

## 2019-04-13 ENCOUNTER — Ambulatory Visit (INDEPENDENT_AMBULATORY_CARE_PROVIDER_SITE_OTHER): Payer: Medicare Other | Admitting: Endocrinology

## 2019-04-13 ENCOUNTER — Encounter: Payer: Self-pay | Admitting: Endocrinology

## 2019-04-13 VITALS — BP 122/68 | HR 94 | Ht 64.25 in | Wt 288.0 lb

## 2019-04-13 DIAGNOSIS — E1142 Type 2 diabetes mellitus with diabetic polyneuropathy: Secondary | ICD-10-CM

## 2019-04-13 DIAGNOSIS — Z794 Long term (current) use of insulin: Secondary | ICD-10-CM | POA: Diagnosis not present

## 2019-04-13 DIAGNOSIS — M4323 Fusion of spine, cervicothoracic region: Secondary | ICD-10-CM | POA: Diagnosis not present

## 2019-04-13 DIAGNOSIS — M6281 Muscle weakness (generalized): Secondary | ICD-10-CM | POA: Diagnosis not present

## 2019-04-13 DIAGNOSIS — E119 Type 2 diabetes mellitus without complications: Secondary | ICD-10-CM | POA: Diagnosis not present

## 2019-04-13 DIAGNOSIS — G959 Disease of spinal cord, unspecified: Secondary | ICD-10-CM | POA: Diagnosis not present

## 2019-04-13 DIAGNOSIS — E1129 Type 2 diabetes mellitus with other diabetic kidney complication: Secondary | ICD-10-CM | POA: Diagnosis not present

## 2019-04-13 DIAGNOSIS — M4714 Other spondylosis with myelopathy, thoracic region: Secondary | ICD-10-CM | POA: Diagnosis not present

## 2019-04-13 LAB — POCT GLYCOSYLATED HEMOGLOBIN (HGB A1C): Hemoglobin A1C: 6.7 % — AB (ref 4.0–5.6)

## 2019-04-13 NOTE — Progress Notes (Signed)
Subjective:    Patient ID: Christy May, female    DOB: 12-Dec-1950, 68 y.o.   MRN: [id number]  HPI Pt returns for f/u of diabetes mellitus:  DM type: Insulin-requiring type 2 Dx'ed: [id number] Complications: polyneuropathy and renal insufficiency.  Therapy: insulin since [id number], and Trulicity GDM: never.  DKA: never.  Severe hypoglycemia: never.  Pancreatitis: never.  Other: she was changed to a qd insulin regimen, after poor results on multiple daily injections; she wants the cheapest possible insulin.  She declines weight loss surgery; she now lives in [place], but she expects to go home in approx 3 weeks.   Staff there administers insulin to her, and checks cbg.   Interval history: cbg record is sent with pt.  cbg varies from 69-421, but most are in the 100's.  It is in general highest fasting, and lowest at HS.  she says she never misses the insulin.  I reviewed med list.  Pt says meals are at 9AM, 12PM, and 6PM.  Pt says HS cbg is low because she is not hungry at [facility].  She then eats a snack at 10PM.  She now takes multiple daily injections. Past Medical History:  Diagnosis Date  . Allergy   . Anemia, unspecified   . Anxiety   . Arthritis    "neck, back, hands" (09/03/2014)  . Chronic airway obstruction, not elsewhere classified    "I was told I don't have this/tests done @ [facility] 05/2014"  . Chronic back pain   . Colon polyps   . Complication of anesthesia    "I've had recall; I probably stopped breathing at least 2 times" (09/03/2014)  . Dehydration   . Depressive disorder, not elsewhere classified   . Diabetic peripheral neuropathy ([place])   . Dysphagia 2007   historyof dysphagia with severe dysmotility by barium swallow-[name]  . Esophageal reflux   . Family history of adverse reaction to anesthesia    "my brother was told they lost him a couple times during OR"  . HCAP (healthcare-associated pneumonia) 03/25/2018  . Heart murmur   . History of hiatal hernia   .  Migraine    "couple times/month right now" (09/03/2014)  . Obesity, unspecified   . OSA (obstructive sleep apnea)     CPAP  . Septic shock ([place]) 08/30/2017  . Spondylosis of unspecified site without mention of myelopathy   . Type II diabetes mellitus ([place])   . Unspecified essential hypertension   . Unspecified menopausal and postmenopausal disorder   . Unspecified vitamin D deficiency   . Upper airway cough syndrome     Past Surgical History:  Procedure Laterality Date  . ABDOMINAL HYSTERECTOMY     "partial"  . ANTERIOR CERVICAL DECOMP/DISCECTOMY FUSION     multiple cervical spine levels;Dr. Arnoldo Morale  . APPENDECTOMY    . BACK SURGERY    . CHOLECYSTECTOMY OPEN    . COLONOSCOPY N/A 05/08/2014   Procedure: COLONOSCOPY;  Surgeon: [name], MD;  Location: [facility];  Service: Endoscopy;  Laterality: N/A;  . DILATION AND CURETTAGE OF UTERUS    . ESOPHAGOGASTRODUODENOSCOPY N/A 05/08/2014   Procedure: ESOPHAGOGASTRODUODENOSCOPY (EGD);  Surgeon: [name], MD;  Location: [name];  Service: Endoscopy;  Laterality: N/A;  . EXPLORATORY LAPAROTOMY    . EYE SURGERY    . FOOT SURGERY Right X 2   "took blood out of my arms and put platelets in my feet"  . INCISION AND DRAINAGE ABSCESS  Chest  . JOINT REPLACEMENT    . REFRACTIVE SURGERY Bilateral   . THORACIC DISCECTOMY N/A 02/17/2018   Procedure: THORACIC ONE - THORACIC TWO LAMINECTOMY;  Surgeon: Newman Pies, MD;  Location: [facility];  Service: Neurosurgery;  Laterality: N/A;  THORACIC ONE - THORACIC TWO LAMINECTOMY  . TONSILLECTOMY    . TOTAL KNEE ARTHROPLASTY Left 10/2003  . TOTAL KNEE ARTHROPLASTY Right 01/2004   [name]    Social History   Socioeconomic History  . Marital status: Widowed    Spouse name: Not on file  . Number of children: 1  . Years of education: Not on file  . Highest education level: Not on file  Occupational History  . Occupation: disabled    Employer: DISABLED  Social Needs  .  Financial resource strain: Not on file  . Food insecurity    Worry: Not on file    Inability: Not on file  . Transportation needs    Medical: Not on file    Non-medical: Not on file  Tobacco Use  . Smoking status: Former Smoker    Packs/day: 1.00    Years: 20.00    Pack years: 20.00    Types: Cigarettes    Quit date: 06/22/1986    Years since quitting: 32.8  . Smokeless tobacco: Never Used  Substance and Sexual Activity  . Alcohol use: Yes  . Drug use: No  . Sexual activity: Not on file  Lifestyle  . Physical activity    Days per week: Not on file    Minutes per session: Not on file  . Stress: Not on file  Relationships  . Social Herbalist on phone: Not on file    Gets together: Not on file    Attends religious service: Not on file    Active member of club or organization: Not on file    Attends meetings of clubs or organizations: Not on file    Relationship status: Not on file  . Intimate partner violence    Fear of current or ex partner: Not on file    Emotionally abused: Not on file    Physically abused: Not on file    Forced sexual activity: Not on file  Other Topics Concern  . Not on file  Social History Narrative   ** Merged History Encounter **       Patient does not get regular exercise.      Widowed 2004      Disabled    Current Outpatient Medications on File Prior to Visit  Medication Sig Dispense Refill  . allopurinol (ZYLOPRIM) 300 MG tablet Take 300 mg by mouth daily.    . benzonatate (TESSALON) 100 MG capsule Take by mouth at bedtime.    . Cholecalciferol (VITAMIN D-3) 1000 units CAPS Take 2,000 Units by mouth daily.    . cyclobenzaprine (FLEXERIL) 10 MG tablet Take 1 tablet (10 mg total) by mouth 3 (three) times daily as needed for muscle spasms. (Patient taking differently: Take 10 mg by mouth 3 (three) times daily. ) 30 tablet 0  . cycloSPORINE (RESTASIS) 0.05 % ophthalmic emulsion Place 1 drop into both eyes 2 (two) times daily.      Marland Kitchen docusate sodium (COLACE) 100 MG capsule Take 1 capsule (100 mg total) by mouth 2 (two) times daily. 10 capsule 0  . fluticasone (FLONASE) 50 MCG/ACT nasal spray SHAKE LIQUID AND USE 2 SPRAYS IN EACH NOSTRIL DAILY (Patient taking differently: Place 2 sprays into  both nostrils daily. ) 16 g 5  . gabapentin (NEURONTIN) 300 MG capsule Take 3 capsules by mouth 3 (three) times daily.    Marland Kitchen guaiFENesin (MUCINEX) 600 MG 12 hr tablet Take 600 mg by mouth 2 (two) times daily.    . hydrochlorothiazide (MICROZIDE) 12.5 MG capsule Take 12.5 mg by mouth daily.    Marland Kitchen HYDROcodone-acetaminophen (NORCO/VICODIN) 5-325 MG tablet Take 2 tablets by mouth every 6 (six) hours as needed (pain). 10 tablet 0  . insulin aspart (NOVOLOG) 100 UNIT/ML injection Inject 12 Units into the skin See admin instructions. With meals. Per sliding scale. If CBG is 201-250= 2 units 251-300= 4 units 301-350= 6 units 351-400= 8 units 401-450= 10 units. If >450 call MD.     . Insulin Syringe-Needle U-100 (INSULIN SYRINGE 1CC/31GX5/16") 31G X 5/16" 1 ML MISC USE TO INJECT INSULIN TWICE DAILY 100 each 0  . irbesartan (AVAPRO) 300 MG tablet Take 300 mg by mouth daily.    Marland Kitchen LEVEMIR 100 UNIT/ML injection Inject 40 Units into the skin 2 (two) times a day.    . loratadine (CLARITIN) 10 MG tablet Take 10 mg by mouth daily.    . meclizine (ANTIVERT) 25 MG tablet Take 1 tablet (25 mg total) by mouth 3 (three) times daily as needed for dizziness. 30 tablet 0  . metoprolol tartrate (LOPRESSOR) 100 MG tablet Take 100 mg by mouth 2 (two) times daily.     . montelukast (SINGULAIR) 10 MG tablet TAKE 1 TABLET(10 MG) BY MOUTH DAILY (Patient taking differently: Take 10 mg by mouth at bedtime. ) 90 tablet 1  . Multiple Vitamins-Minerals (MULTIVITAMIN WITH MINERALS) tablet Take 1 tablet by mouth daily.    . ONE TOUCH LANCETS MISC Use to check sugars twice daily DX E11.22 200 each 3  . oxybutynin (DITROPAN XL) 15 MG 24 hr tablet Take 1 tablet by mouth daily.     . pantoprazole (PROTONIX) 40 MG tablet TAKE 1 TABLET(40 MG) BY MOUTH TWICE DAILY (Patient taking differently: Take 40 mg by mouth 2 (two) times daily. ) 60 tablet 2  . polycarbophil (FIBERCON) 625 MG tablet Take 625 mg by mouth daily.    . polyethylene glycol (MIRALAX / GLYCOLAX) packet Take 17 g by mouth 2 (two) times daily. 14 each 0  . predniSONE (DELTASONE) 10 MG tablet Take 20 mg by mouth daily with breakfast.    . senna (SENOKOT) 8.6 MG TABS tablet Take 1 tablet (8.6 mg total) by mouth daily. 120 each 0  . sodium chloride (OCEAN) 0.65 % SOLN nasal spray Place 2 sprays into both nostrils 2 (two) times daily.    Marland Kitchen triamcinolone cream (KENALOG) 0.1 % Apply 1 application topically 2 (two) times daily.    . TRULICITY 1.5 0000000 SOPN Inject 0.5 mLs into the skin daily. On Friday     No current facility-administered medications on file prior to visit.     Allergies  Allergen Reactions  . Ace Inhibitors Anaphylaxis and Cough    Tolerates Irbesartan (home med)  . Penicillins Hives and Other (See Comments)    Tolerated ceftriaxone in 2019  Has patient had a PCN reaction causing immediate rash, facial/tongue/throat swelling, SOB or lightheadedness with hypotension: Yes Has patient had a PCN reaction causing severe rash involving mucus membranes or skin necrosis:  No Has patient had a PCN reaction that required hospitalization: No Has patient had a PCN reaction occurring within the last 10 years: No If all of the above answers are "NO",  then may proceed with Cephalosporin use.  . Adhesive [Tape] Hives and Rash    Family History  Problem Relation Age of Onset  . Esophageal cancer Brother   . Esophageal cancer Sister        ?  . Colon polyps Brother   . Pancreatic cancer Sister        ?  . Diabetes Sister   . Diabetes Other        Aunt and Uncle  . Heart disease Maternal Grandfather     BP 122/68 (BP Location: Right Wrist, Patient Position: Sitting, Cuff Size: Large)   Pulse 94    Ht 5' 4.25" (1.632 m)   Wt 288 lb (130.6 kg) Comment: verbalized  SpO2 92%   BMI 49.05 kg/m    Review of Systems Denies LOC.      Objective:   Physical Exam VITAL SIGNS:  See vs page GENERAL: no distress Pulses: dorsalis pedis intact bilat.   MSK: no deformity of the feet CV: trace bilat leg edema Skin:  no ulcer on the feet.  normal color and temp on the feet. Neuro: sensation is intact to touch on the feet, but decreased from normal.    Lab Results  Component Value Date   HGBA1C 6.7 (A) 04/13/2019       Assessment & Plan:  Insulin-requiring type 2 DM, with PN: overcontrolled Hypoglycemia: this limits aggressiveness of glycemic control Renal insuff: after she goes home, we'll change to Levemir QAM only.    Patient Instructions  Please stop taking the metformin continue the same Trulicity and insulins.  On this type of insulin schedule, you should eat meals on a regular schedule.  If a meal is missed or significantly delayed, your blood sugar could go low.   Call us if the prednisone is increased again and blood sugar is high, so we can temporarily increase the insulin.  check your blood sugar twice a day.  vary the time of day when you check, between before the 3 meals, and at bedtime.  also check if you have symptoms of your blood sugar being too high or too low.  please keep a record of the readings and bring it to your next appointment here.  You can write it on any piece of paper.  please call us sooner if your blood sugar goes below 70, or if you have a lot of readings over 200.   Please come back for a follow-up appointment in 6 weeks.

## 2019-04-13 NOTE — Patient Instructions (Addendum)
Please stop taking the metformin continue the same Trulicity and insulins.  On this type of insulin schedule, you should eat meals on a regular schedule.  If a meal is missed or significantly delayed, your blood sugar could go low.   Call us if the prednisone is increased again and blood sugar is high, so we can temporarily increase the insulin.  check your blood sugar twice a day.  vary the time of day when you check, between before the 3 meals, and at bedtime.  also check if you have symptoms of your blood sugar being too high or too low.  please keep a record of the readings and bring it to your next appointment here.  You can write it on any piece of paper.  please call us sooner if your blood sugar goes below 70, or if you have a lot of readings over 200.   Please come back for a follow-up appointment in 6 weeks.

## 2019-04-14 DIAGNOSIS — M4714 Other spondylosis with myelopathy, thoracic region: Secondary | ICD-10-CM | POA: Diagnosis not present

## 2019-04-14 DIAGNOSIS — M6281 Muscle weakness (generalized): Secondary | ICD-10-CM | POA: Diagnosis not present

## 2019-04-14 DIAGNOSIS — G959 Disease of spinal cord, unspecified: Secondary | ICD-10-CM | POA: Diagnosis not present

## 2019-04-14 DIAGNOSIS — M4323 Fusion of spine, cervicothoracic region: Secondary | ICD-10-CM | POA: Diagnosis not present

## 2019-04-18 DIAGNOSIS — M4323 Fusion of spine, cervicothoracic region: Secondary | ICD-10-CM | POA: Diagnosis not present

## 2019-04-18 DIAGNOSIS — G959 Disease of spinal cord, unspecified: Secondary | ICD-10-CM | POA: Diagnosis not present

## 2019-04-18 DIAGNOSIS — M6281 Muscle weakness (generalized): Secondary | ICD-10-CM | POA: Diagnosis not present

## 2019-04-18 DIAGNOSIS — M4714 Other spondylosis with myelopathy, thoracic region: Secondary | ICD-10-CM | POA: Diagnosis not present

## 2019-04-19 DIAGNOSIS — G959 Disease of spinal cord, unspecified: Secondary | ICD-10-CM | POA: Diagnosis not present

## 2019-04-19 DIAGNOSIS — M4323 Fusion of spine, cervicothoracic region: Secondary | ICD-10-CM | POA: Diagnosis not present

## 2019-04-19 DIAGNOSIS — J449 Chronic obstructive pulmonary disease, unspecified: Secondary | ICD-10-CM | POA: Diagnosis not present

## 2019-04-19 DIAGNOSIS — M4714 Other spondylosis with myelopathy, thoracic region: Secondary | ICD-10-CM | POA: Diagnosis not present

## 2019-04-19 DIAGNOSIS — E1140 Type 2 diabetes mellitus with diabetic neuropathy, unspecified: Secondary | ICD-10-CM | POA: Diagnosis not present

## 2019-04-19 DIAGNOSIS — K219 Gastro-esophageal reflux disease without esophagitis: Secondary | ICD-10-CM | POA: Diagnosis not present

## 2019-04-19 DIAGNOSIS — M6281 Muscle weakness (generalized): Secondary | ICD-10-CM | POA: Diagnosis not present

## 2019-04-19 DIAGNOSIS — D649 Anemia, unspecified: Secondary | ICD-10-CM | POA: Diagnosis not present

## 2019-04-20 DIAGNOSIS — G959 Disease of spinal cord, unspecified: Secondary | ICD-10-CM | POA: Diagnosis not present

## 2019-04-20 DIAGNOSIS — M4323 Fusion of spine, cervicothoracic region: Secondary | ICD-10-CM | POA: Diagnosis not present

## 2019-04-20 DIAGNOSIS — M6281 Muscle weakness (generalized): Secondary | ICD-10-CM | POA: Diagnosis not present

## 2019-04-20 DIAGNOSIS — M4714 Other spondylosis with myelopathy, thoracic region: Secondary | ICD-10-CM | POA: Diagnosis not present

## 2019-04-21 ENCOUNTER — Emergency Department (HOSPITAL_COMMUNITY)
Admission: EM | Admit: 2019-04-21 | Discharge: 2019-04-21 | Disposition: A | Discharge status: Home / Self Care | Payer: Medicare Other | Attending: Emergency Medicine | Admitting: Emergency Medicine

## 2019-04-21 ENCOUNTER — Emergency Department (HOSPITAL_COMMUNITY): Payer: Medicare Other

## 2019-04-21 ENCOUNTER — Other Ambulatory Visit: Payer: Self-pay

## 2019-04-21 ENCOUNTER — Encounter (HOSPITAL_COMMUNITY): Payer: Self-pay

## 2019-04-21 DIAGNOSIS — R1010 Upper abdominal pain, unspecified: Secondary | ICD-10-CM | POA: Diagnosis not present

## 2019-04-21 DIAGNOSIS — Z7401 Bed confinement status: Secondary | ICD-10-CM | POA: Diagnosis not present

## 2019-04-21 DIAGNOSIS — R0789 Other chest pain: Secondary | ICD-10-CM | POA: Diagnosis not present

## 2019-04-21 DIAGNOSIS — Z79899 Other long term (current) drug therapy: Secondary | ICD-10-CM | POA: Insufficient documentation

## 2019-04-21 DIAGNOSIS — E119 Type 2 diabetes mellitus without complications: Secondary | ICD-10-CM | POA: Diagnosis not present

## 2019-04-21 DIAGNOSIS — Z794 Long term (current) use of insulin: Secondary | ICD-10-CM | POA: Diagnosis not present

## 2019-04-21 DIAGNOSIS — I959 Hypotension, unspecified: Secondary | ICD-10-CM | POA: Diagnosis not present

## 2019-04-21 DIAGNOSIS — Z87891 Personal history of nicotine dependence: Secondary | ICD-10-CM | POA: Insufficient documentation

## 2019-04-21 DIAGNOSIS — I10 Essential (primary) hypertension: Secondary | ICD-10-CM | POA: Insufficient documentation

## 2019-04-21 DIAGNOSIS — R079 Chest pain, unspecified: Secondary | ICD-10-CM | POA: Diagnosis present

## 2019-04-21 DIAGNOSIS — R531 Weakness: Secondary | ICD-10-CM | POA: Diagnosis not present

## 2019-04-21 DIAGNOSIS — M2550 Pain in unspecified joint: Secondary | ICD-10-CM | POA: Diagnosis not present

## 2019-04-21 DIAGNOSIS — R0902 Hypoxemia: Secondary | ICD-10-CM | POA: Diagnosis not present

## 2019-04-21 DIAGNOSIS — N281 Cyst of kidney, acquired: Secondary | ICD-10-CM | POA: Diagnosis not present

## 2019-04-21 LAB — TROPONIN I (HIGH SENSITIVITY)
Troponin I (High Sensitivity): 4 ng/L (ref <18)
Troponin I (High Sensitivity): 6 ng/L (ref <18)

## 2019-04-21 LAB — CBC WITH DIFFERENTIAL/PLATELET
Abs Immature Granulocytes: 0.03 10*3/uL (ref 0.00–0.07)
Basophils Absolute: 0 10*3/uL (ref 0.0–0.1)
Basophils Relative: 0 %
Eosinophils Absolute: 0.1 10*3/uL (ref 0.0–0.5)
Eosinophils Relative: 1 %
HCT: 41.3 % (ref 36.0–46.0)
Hemoglobin: 12.4 g/dL (ref 12.0–15.0)
Immature Granulocytes: 0 %
Lymphocytes Relative: 34 %
Lymphs Abs: 3.3 10*3/uL (ref 0.7–4.0)
MCH: 28.9 pg (ref 26.0–34.0)
MCHC: 30 g/dL (ref 30.0–36.0)
MCV: 96.3 fL (ref 80.0–100.0)
Monocytes Absolute: 0.6 10*3/uL (ref 0.1–1.0)
Monocytes Relative: 7 %
Neutro Abs: 5.5 10*3/uL (ref 1.7–7.7)
Neutrophils Relative %: 58 %
Platelets: 202 10*3/uL (ref 150–400)
RBC: 4.29 MIL/uL (ref 3.87–5.11)
RDW: 14.9 % (ref 11.5–15.5)
WBC: 9.6 10*3/uL (ref 4.0–10.5)
nRBC: 0 % (ref 0.0–0.2)

## 2019-04-21 LAB — COMPREHENSIVE METABOLIC PANEL
ALT: 22 U/L (ref 0–44)
AST: 17 U/L (ref 15–41)
Albumin: 3.1 g/dL — ABNORMAL LOW (ref 3.5–5.0)
Alkaline Phosphatase: 52 U/L (ref 38–126)
Anion gap: 15 (ref 5–15)
BUN: 35 mg/dL — ABNORMAL HIGH (ref 8–23)
CO2: 26 mmol/L (ref 22–32)
Calcium: 9.5 mg/dL (ref 8.9–10.3)
Chloride: 97 mmol/L — ABNORMAL LOW (ref 98–111)
Creatinine, Ser: 1.18 mg/dL — ABNORMAL HIGH (ref 0.44–1.00)
GFR calc Af Amer: 54 mL/min — ABNORMAL LOW (ref >60)
GFR calc non Af Amer: 47 mL/min — ABNORMAL LOW (ref >60)
Glucose, Bld: 209 mg/dL — ABNORMAL HIGH (ref 70–99)
Potassium: 4.5 mmol/L (ref 3.5–5.1)
Sodium: 138 mmol/L (ref 135–145)
Total Bilirubin: 0.2 mg/dL — ABNORMAL LOW (ref 0.3–1.2)
Total Protein: 6.6 g/dL (ref 6.5–8.1)

## 2019-04-21 LAB — LIPASE, BLOOD: Lipase: 32 U/L (ref 11–51)

## 2019-04-21 LAB — BRAIN NATRIURETIC PEPTIDE: B Natriuretic Peptide: 30.9 pg/mL (ref 0.0–100.0)

## 2019-04-21 MED ORDER — LIDOCAINE VISCOUS HCL 2 % MT SOLN
15.0000 mL | Freq: Once | OROMUCOSAL | Status: AC
  Administered 2019-04-21: 12:00:00 15 mL via ORAL
  Filled 2019-04-21: qty 15

## 2019-04-21 MED ORDER — FAMOTIDINE IN NACL 20-0.9 MG/50ML-% IV SOLN
20.0000 mg | Freq: Once | INTRAVENOUS | Status: AC
  Administered 2019-04-21: 20 mg via INTRAVENOUS
  Filled 2019-04-21: qty 50

## 2019-04-21 MED ORDER — IOHEXOL 300 MG/ML  SOLN
100.0000 mL | Freq: Once | INTRAMUSCULAR | Status: AC | PRN
  Administered 2019-04-21: 100 mL via INTRAVENOUS

## 2019-04-21 MED ORDER — ONDANSETRON HCL 4 MG/2ML IJ SOLN
4.0000 mg | Freq: Once | INTRAMUSCULAR | Status: AC
  Administered 2019-04-21: 4 mg via INTRAVENOUS
  Filled 2019-04-21: qty 2

## 2019-04-21 MED ORDER — MORPHINE SULFATE (PF) 4 MG/ML IV SOLN
4.0000 mg | Freq: Once | INTRAVENOUS | Status: AC
  Administered 2019-04-21: 4 mg via INTRAVENOUS
  Filled 2019-04-21: qty 1

## 2019-04-21 MED ORDER — ALUM & MAG HYDROXIDE-SIMETH 200-200-20 MG/5ML PO SUSP
30.0000 mL | Freq: Once | ORAL | Status: AC
  Administered 2019-04-21: 12:00:00 30 mL via ORAL
  Filled 2019-04-21: qty 30

## 2019-04-21 MED ORDER — FENTANYL CITRATE (PF) 100 MCG/2ML IJ SOLN
25.0000 ug | Freq: Once | INTRAMUSCULAR | Status: AC
  Administered 2019-04-21: 25 ug via INTRAVENOUS
  Filled 2019-04-21: qty 2

## 2019-04-21 MED ORDER — ACETAMINOPHEN 325 MG PO TABS
650.0000 mg | ORAL_TABLET | Freq: Four times a day (QID) | ORAL | Status: DC | PRN
  Administered 2019-04-21: 650 mg via ORAL
  Filled 2019-04-21: qty 2

## 2019-04-21 MED ORDER — SODIUM CHLORIDE 0.9 % IV BOLUS: 1000.0000 mL | Freq: Once | INTRAVENOUS | Status: DC

## 2019-04-21 NOTE — Discharge Instructions (Addendum)
Please take Tylenol for any further pain.  No acute abnormality could be found on your labs or your CT scan.  Of note, you were noted to incidentally have multiple cysts in your bilateral kidneys.  Please follow-up with your PCP regarding this.

## 2019-04-21 NOTE — ED Notes (Signed)
Attempted to Call [facility] x3. to give report on patient no answer.

## 2019-04-21 NOTE — ED Provider Notes (Signed)
[facility] EMERGENCY DEPARTMENT Provider Note   CSN: [id number] Arrival date & time: 04/21/19  1048     History   Chief Complaint Chief Complaint  Patient presents with  . Chest Pain    HPI Christy May is a 68 y.o. female.     The history is provided by the patient and medical records. No language interpreter was used.  Chest Pain    68 year old female with history of diabetes, GERD, HTN, anemia, depression, brought to ER by EMS from [facility] for c/p.  Patient report this morning while she was in the middle of eating her breakfast which includes grits, eggs, bacon she developed pain to her left side of chest.  She described as an indigestion pressure sensation, persistent, without any significant shortness of breath diaphoresis lightheadedness or dizziness.  She thought it may be heartburn but when the staff evaluate her she was noted to be hypotensive prompting ER visit.  Patient report EMS did give her some aspirin which provide minimal improvement of her symptoms.  She was found to be mildly hypoxic with an O2 sats of 90%'s, and 4 L of nasal cannula was given.  At this time she still rates her pain is 10 out of 10.  She denies any recent sickness, no fever chills productive cough, no nausea or vomiting or diarrhea no dysuria no abdominal pain or back pain.  She denies any significant cardiac history.  She did mention having recurrent chest wall pain from prior cervical spinal fusion procedure that injured her muscle.  States that this particular pain is slightly different.  Past Medical History:  Diagnosis Date  . Allergy   . Anemia, unspecified   . Anxiety   . Arthritis    "neck, back, hands" (09/03/2014)  . Chronic airway obstruction, not elsewhere classified    "I was told I don't have this/tests done @ [facility] 05/2014"  . Chronic back pain   . Colon polyps   . Complication of anesthesia    "I've had recall; I probably stopped breathing  at least 2 times" (09/03/2014)  . Dehydration   . Depressive disorder, not elsewhere classified   . Diabetic peripheral neuropathy ([place])   . Dysphagia 2007   historyof dysphagia with severe dysmotility by barium swallow-[name]  . Esophageal reflux   . Family history of adverse reaction to anesthesia    "my brother was told they lost him a couple times during OR"  . HCAP (healthcare-associated pneumonia) 03/25/2018  . Heart murmur   . History of hiatal hernia   . Migraine    "couple times/month right now" (09/03/2014)  . Obesity, unspecified   . OSA (obstructive sleep apnea)     CPAP  . Septic shock ([place]) 08/30/2017  . Spondylosis of unspecified site without mention of myelopathy   . Type II diabetes mellitus ([address])   . Unspecified essential hypertension   . Unspecified menopausal and postmenopausal disorder   . Unspecified vitamin D deficiency   . Upper airway cough syndrome     Patient Active Problem List   Diagnosis Date Noted  . Fungal skin infection 03/25/2018  . Thoracic spondylosis with myelopathy 02/17/2018  . Spondylosis, thoracic, with myelopathy 02/17/2018  . Dehydration 02/11/2018  . AKI (acute kidney injury) ([place]) 02/11/2018  . Hypotension 02/11/2018  . Syncope, vasovagal 02/11/2018  . Syncope 02/11/2018  . DM neuropathy, type II diabetes mellitus ([place]) 11/18/2017  . Weakness 11/18/2017  . SIRS (systemic inflammatory response  syndrome) ([place]) 11/17/2017  . Diabetes mellitus type 2 in obese ([place]) 11/17/2017  . Acute lower UTI 11/17/2017  . Acute renal failure (ARF) ([place]) 08/13/2017  . Pressure injury of skin 08/13/2017  . Sepsis due to gram-negative UTI ([place]) 08/12/2017  . Upper airway cough syndrome 09/28/2016  . Acute bronchitis 10/29/2015  . Diabetes ([place]) 08/21/2015  . Chest pain 07/14/2015  . Dyspnea on exertion 07/05/2015  . Obstructive sleep apnea 07/05/2015  . Constipation 03/05/2015  . Allergic rhinitis 03/05/2015  . Hearing loss 03/05/2015  .  Vocal cord dysfunction 03/05/2015  . Pain in joint, shoulder region 09/11/2014  . Weakness generalized 09/02/2014  . Acute esophagitis 05/08/2014  . Benign neoplasm of sigmoid colon 05/08/2014  . Change in bowel habits 04/17/2014  . Fecal incontinence 04/17/2014  . Urge incontinence 04/08/2014  . Cough 12/06/2013  . Orthostasis 07/18/2013  . Acute kidney injury ([place]) 07/18/2013  . Bilateral carpal tunnel syndrome 02/14/2013  . Gout 01/04/2013  . Failure to thrive 06/28/2012  . Gait disorder 06/28/2012  . Lower abdominal pain 12/14/2011  . Preventative health care 08/23/2011  . Cervical radiculopathy 02/11/2011  . Low back pain 02/11/2011  . VITAMIN D DEFICIENCY 11/22/2009  . ANEMIA-NOS 11/22/2009  . DEGENERATIVE JOINT DISEASE, CERVICAL SPINE 11/22/2009  . Depression 09/16/2009  . POLYNEUROPATHY 09/16/2009  . Morbid obesity ([place]) 09/09/2009  . Essential hypertension 09/09/2009  . GERD 09/09/2009  . Sleep apnea 09/09/2009    Past Surgical History:  Procedure Laterality Date  . ABDOMINAL HYSTERECTOMY     "partial"  . ANTERIOR CERVICAL DECOMP/DISCECTOMY FUSION     multiple cervical spine levels;Dr. Arnoldo Morale  . APPENDECTOMY    . BACK SURGERY    . CHOLECYSTECTOMY OPEN    . COLONOSCOPY N/A 05/08/2014   Procedure: COLONOSCOPY;  Surgeon: [name], MD;  Location: [facility];  Service: Endoscopy;  Laterality: N/A;  . DILATION AND CURETTAGE OF UTERUS    . ESOPHAGOGASTRODUODENOSCOPY N/A 05/08/2014   Procedure: ESOPHAGOGASTRODUODENOSCOPY (EGD);  Surgeon: [name], MD;  Location: [name];  Service: Endoscopy;  Laterality: N/A;  . EXPLORATORY LAPAROTOMY    . EYE SURGERY    . FOOT SURGERY Right X 2   "took blood out of my arms and put platelets in my feet"  . INCISION AND DRAINAGE ABSCESS     Chest  . JOINT REPLACEMENT    . REFRACTIVE SURGERY Bilateral   . THORACIC DISCECTOMY N/A 02/17/2018   Procedure: THORACIC ONE - THORACIC TWO LAMINECTOMY;  Surgeon: Newman Pies, MD;  Location: [facility];  Service: Neurosurgery;  Laterality: N/A;  THORACIC ONE - THORACIC TWO LAMINECTOMY  . TONSILLECTOMY    . TOTAL KNEE ARTHROPLASTY Left 10/2003  . TOTAL KNEE ARTHROPLASTY Right 01/2004   [name]     OB History   No obstetric history on file.      Home Medications    Prior to Admission medications   Medication Sig Start Date End Date Taking? Authorizing Provider  allopurinol (ZYLOPRIM) 300 MG tablet Take 300 mg by mouth daily.    [provider]  benzonatate (TESSALON) 100 MG capsule Take by mouth at bedtime.    [provider]  Cholecalciferol (VITAMIN D-3) 1000 units CAPS Take 2,000 Units by mouth daily.    [provider]  cyclobenzaprine (FLEXERIL) 10 MG tablet Take 1 tablet (10 mg total) by mouth 3 (three) times daily as needed for muscle spasms. Patient taking differently: Take 10 mg by mouth 3 (three)  times daily.  02/22/18   Regalado, Belkys A, MD  cycloSPORINE (RESTASIS) 0.05 % ophthalmic emulsion Place 1 drop into both eyes 2 (two) times daily.     [provider]  docusate sodium (COLACE) 100 MG capsule Take 1 capsule (100 mg total) by mouth 2 (two) times daily. 02/19/18   Regalado, Belkys A, MD  fluticasone (FLONASE) 50 MCG/ACT nasal spray SHAKE LIQUID AND USE 2 SPRAYS IN EACH NOSTRIL DAILY Patient taking differently: Place 2 sprays into both nostrils daily.  03/08/17   Collene Gobble, MD  gabapentin (NEURONTIN) 300 MG capsule Take 3 capsules by mouth 3 (three) times daily. 11/14/18   [provider]  guaiFENesin (MUCINEX) 600 MG 12 hr tablet Take 600 mg by mouth 2 (two) times daily.    [provider]  hydrochlorothiazide (MICROZIDE) 12.5 MG capsule Take 12.5 mg by mouth daily.    [provider]  HYDROcodone-acetaminophen (NORCO/VICODIN) 5-325 MG tablet Take 2 tablets by mouth every 6 (six) hours as needed (pain). 03/28/18   Caren Griffins, MD  insulin aspart (NOVOLOG) 100 UNIT/ML  injection Inject 12 Units into the skin See admin instructions. With meals. Per sliding scale. If CBG is 201-250= 2 units 251-300= 4 units 301-350= 6 units 351-400= 8 units 401-450= 10 units. If >450 call MD.     [provider]  Insulin Syringe-Needle U-100 (INSULIN SYRINGE 1CC/31GX5/16") 31G X 5/16" 1 ML MISC USE TO INJECT INSULIN TWICE DAILY 05/11/17   Renato Shin, MD  irbesartan (AVAPRO) 300 MG tablet Take 300 mg by mouth daily.    [provider]  LEVEMIR 100 UNIT/ML injection Inject 40 Units into the skin 2 (two) times a day. 11/22/18   [provider]  loratadine (CLARITIN) 10 MG tablet Take 10 mg by mouth daily.    [provider]  meclizine (ANTIVERT) 25 MG tablet Take 1 tablet (25 mg total) by mouth 3 (three) times daily as needed for dizziness. 02/06/18   Dorie Rank, MD  metoprolol tartrate (LOPRESSOR) 100 MG tablet Take 100 mg by mouth 2 (two) times daily.     [provider]  montelukast (SINGULAIR) 10 MG tablet TAKE 1 TABLET(10 MG) BY MOUTH DAILY Patient taking differently: Take 10 mg by mouth at bedtime.  04/03/16   Magdalen Spatz, NP  Multiple Vitamins-Minerals (MULTIVITAMIN WITH MINERALS) tablet Take 1 tablet by mouth daily.    [provider]  ONE TOUCH LANCETS MISC Use to check sugars twice daily DX E11.22 08/23/17   Renato Shin, MD  oxybutynin (DITROPAN XL) 15 MG 24 hr tablet Take 1 tablet by mouth daily. 12/02/18   [provider]  pantoprazole (PROTONIX) 40 MG tablet TAKE 1 TABLET(40 MG) BY MOUTH TWICE DAILY Patient taking differently: Take 40 mg by mouth 2 (two) times daily.  03/02/17   Collene Gobble, MD  polycarbophil (FIBERCON) 625 MG tablet Take 625 mg by mouth daily.    [provider]  polyethylene glycol (MIRALAX / GLYCOLAX) packet Take 17 g by mouth 2 (two) times daily. 02/22/18   Regalado, Belkys A, MD  predniSONE (DELTASONE) 10 MG tablet Take 20 mg by mouth daily with breakfast.    [provider]  senna (SENOKOT) 8.6 MG TABS tablet Take 1 tablet (8.6 mg total) by mouth daily. 02/23/18   Regalado, Belkys A, MD  sodium chloride (OCEAN) 0.65 % SOLN nasal spray Place 2 sprays into both nostrils 2 (two) times daily.    [provider]  triamcinolone cream (KENALOG) 0.1 % Apply 1 application topically 2 (two) times daily.    [provider]  TRULICITY 1.5 0000000 SOPN Inject 0.5 mLs into the skin daily. On Friday 12/11/18   [provider]    Family History Family History  Problem Relation Age of Onset  . Esophageal cancer Brother   . Esophageal cancer Sister        ?  . Colon polyps Brother   . Pancreatic cancer Sister        ?  . Diabetes Sister   . Diabetes Other        Aunt and Uncle  . Heart disease Maternal Grandfather     Social History Social History   Tobacco Use  . Smoking status: Former Smoker    Packs/day: 1.00    Years: 20.00    Pack years: 20.00    Types: Cigarettes    Quit date: 06/22/1986    Years since quitting: 32.8  . Smokeless tobacco: Never Used  Substance Use Topics  . Alcohol use: Yes  . Drug use: No     Allergies   Ace inhibitors, Penicillins, and Adhesive [tape]   Review of Systems Review of Systems  Cardiovascular: Positive for chest pain.  All other systems reviewed and are negative.    Physical Exam Updated Vital Signs BP (!) 105/57 (BP Location: Right Arm)   Pulse 88   Temp 98.1 F (36.7 C) (Oral)   Resp 14   Ht 5\' 4"  (1.626 m)   Wt 130.6 kg   SpO2 100%   BMI 49.44 kg/m   Physical Exam Vitals signs and nursing note reviewed.  Constitutional:      General: She is not in acute distress.    Appearance: She is well-developed. She is obese.  HENT:     Head: Atraumatic.  Eyes:     Conjunctiva/sclera: Conjunctivae normal.  Neck:     Musculoskeletal: Neck supple.  Cardiovascular:     Rate and Rhythm: Normal rate and regular rhythm.     Heart sounds: Normal heart sounds.   Pulmonary:     Effort: Pulmonary effort is normal.     Breath sounds: Normal breath sounds. No decreased breath sounds, wheezing or rhonchi.  Chest:     Chest wall: Tenderness (Tenderness to left anterior chest wall on palpation without crepitus or emphysema.  No overlying skin changes.) present.  Abdominal:     Palpations: Abdomen is soft.  Musculoskeletal:     Right lower leg: Edema present.     Left lower leg: No edema.  Skin:    Findings: No rash.  Neurological:     Mental Status: She is alert and oriented to person, place, and time.  Psychiatric:        Mood and Affect: Mood normal.      ED Treatments / Results  Labs (all labs ordered are listed, but only abnormal results are displayed) Labs Reviewed  COMPREHENSIVE METABOLIC PANEL - Abnormal; Notable for the following components:      Result Value   Chloride 97 (*)    Glucose, Bld 209 (*)    BUN 35 (*)    Creatinine, Ser 1.18 (*)    Albumin 3.1 (*)    Total Bilirubin 0.2 (*)    GFR calc non Af Amer 47 (*)    GFR calc Af Amer 54 (*)    All other components within normal limits  CBC WITH DIFFERENTIAL/PLATELET  LIPASE, BLOOD  BRAIN NATRIURETIC PEPTIDE  TROPONIN I (HIGH SENSITIVITY)  TROPONIN I (HIGH SENSITIVITY)    EKG EKG Interpretation  Date/Time:  Friday April 21 2019 11:40:36 EDT Ventricular Rate:  82 PR Interval:    QRS Duration: 76 QT Interval:  376 QTC Calculation: 440 R Axis:   -6 Text Interpretation: Sinus rhythm Consider left atrial enlargement No significant change since last tracing Confirmed by Wandra Arthurs [phone]) on 04/21/2019 1:12:10 PM    Date: 04/21/2019  Rate: 82  Rhythm: normal sinus rhythm  QRS Axis: normal  Intervals: normal  ST/T Wave abnormalities: normal  Conduction Disutrbances: none  Narrative Interpretation:   Old EKG Reviewed: No significant changes noted     Radiology Dg Chest 2 View  Result Date: 04/21/2019 CLINICAL DATA:  Chest pain. EXAM: CHEST - 2 VIEW  COMPARISON:  04/22/2018. FINDINGS: Stable cardiomegaly. Mild bibasilar atelectasis/infiltrates. Small bilateral pleural effusions. Cervicothoracic spine fusion. IMPRESSION: 1.  Stable cardiomegaly. 2. Mild bibasilar atelectasis/infiltrates. Small bilateral pleural effusions. Electronically Signed   By: Marcello Moores  Register   On: 04/21/2019 11:38    Procedures Procedures (including critical care time)  Medications Ordered in ED Medications  alum & mag hydroxide-simeth (MAALOX/MYLANTA) 200-200-20 MG/5ML suspension 30 mL (30 mLs Oral Given 04/21/19 1211)    And  lidocaine (XYLOCAINE) 2 % viscous mouth solution 15 mL (15 mLs Oral Given 04/21/19 1211)  famotidine (PEPCID) IVPB 20 mg premix (0 mg Intravenous Stopped 04/21/19 1515)  morphine 4 MG/ML injection 4 mg (4 mg Intravenous Given 04/21/19 1411)  ondansetron (ZOFRAN) injection 4 mg (4 mg Intravenous Given 04/21/19 1411)     Initial Impression / Assessment and Plan / ED Course  I have reviewed the triage vital signs and the nursing notes.  Pertinent labs & imaging results that were available during my care of the patient were reviewed by me and considered in my medical decision making (see chart for details).        BP 103/65 (BP Location: Right Arm)   Pulse 88   Temp 98.1 F (36.7 C) (Oral)   Resp 13   Ht 5\' 4"  (1.626 m)   Wt 130.6 kg   SpO2 100%   BMI 49.44 kg/m    Final Clinical Impressions(s) / ED Diagnoses   Final diagnoses:  Atypical chest pain  Chest wall pain    ED Discharge Orders    None     12:07 PM Patient here for evaluation of chest pain while eating breakfast.  Pain appears to be atypical of ACS.  Pain started approximately 2 hrs ago. Initial CXR shows mild bibasila atelectasis/infiltrates with small bilateral pleural effusions.  Pt without productive cough, fever or sob to suggest PNA.  Initial EKG unremarkable.  Given history of GERD, will provide GI cocktail, work-up initiated.  Pain is also  reproducible, suspect MSK cause.  2:44 PM Patient report minimal improvement after receiving GI cocktail.  Still endorse pain.  Blood pressure is a bit soft.  Will give Pepcid IV.  This patient has upper abdominal pain, and vague symptoms, will obtain abdominal pelvis CT scan for evaluation.  Plan to also obtain delta troponin, if both are negative, anticipate discharge.  Care discussed with Dr. Darl Householder.   Patient is a difficult IV access, Dr. Darl Householder was able to place an IV to the left cephalic vein using ultrasound-guided IV.  3:24 PM Patient signed out to oncoming resident.  Plan to obtain delta troponin, as well as follow-up on abdominal and pelvic CT scan.  If both are within normal limit, patient will be reassessed, and likely discharge back to her rehab facility.   Domenic Moras, PA-C 04/21/19 1525    Drenda Freeze, MD 04/25/19 1322

## 2019-04-21 NOTE — ED Notes (Signed)
[facility] called for transport home per RN request

## 2019-04-21 NOTE — ED Notes (Signed)
Updated PA, Rona Ravens and Dr. Darl Householder of patient ongoing chest pain. Resident at bedside attempting to ultrasound IV. New orders placed. Still awaiting IV team.

## 2019-04-21 NOTE — ED Provider Notes (Signed)
  Physical Exam  BP 103/65 (BP Location: Right Arm)   Pulse 88   Temp 98.1 F (36.7 C) (Oral)   Resp 13   Ht 5\' 4"  (1.626 m)   Wt 130.6 kg   SpO2 100%   BMI 49.44 kg/m   Physical Exam Vitals signs and nursing note reviewed.  Constitutional:      Appearance: She is well-developed. She is obese.     Comments: Resting in bed  HENT:     Head: Normocephalic and atraumatic.  Eyes:     Conjunctiva/sclera: Conjunctivae normal.  Neck:     Musculoskeletal: Neck supple.  Cardiovascular:     Rate and Rhythm: Normal rate and regular rhythm.     Heart sounds: No murmur.  Pulmonary:     Effort: Pulmonary effort is normal. No respiratory distress.     Breath sounds: Normal breath sounds.     Comments: Oxygen saturation 86 to 87%, placed on 2 L nasal cannula Chest:     Comments: Mild left-sided chest wall tenderness to palpation, underneath her rib cage almost to the abdomen, no peritoneal signs of the abdomen Abdominal:     Palpations: Abdomen is soft.     Tenderness: There is no abdominal tenderness.  Skin:    General: Skin is warm and dry.  Neurological:     Mental Status: She is alert.     ED Course/Procedures     Procedures  MDM  Christy May is a 68 y.o. female with a past medical history of GERD, diabetes, hypertension, depression presents today for left-sided chest wall tenderness to palpation.  Reportedly, pain started when she was eating.  She has received a GI cocktail that did not help much per the previous shift as well as GERD medications.  Her chest wall pain is reproducible on palpation.  Delta troponin resulted as negative.  CT abdomen and pelvis pending.  Last echo reviewed from 02/12/18 and showed a normal ejection fraction.  Pain medicine given in the meantime.  During reassessment, patient had an oxygen saturation of 86 to 87%, for which she was placed on 2 L nasal cannula.  Apparently, she uses a CPAP machine at home whenever she rests or sleeps.  Suspect  her decreased oxygen saturation is related to her baseline sleep apnea.   CT scan resulted as negative.  Likely diagnosis at this time is MSK pain.  Patient is recommended to use Tylenol for further pain.  She was told incidental finding of numerous cysts in bilateral kidneys.  Recommend outpatient follow-up with her PCP regarding this.  Care of patient was discussed with the supervising attending.     Julianne Rice, MD 04/21/19 1928    Rex Kras Wenda Overland, MD 04/25/19 [phone]

## 2019-04-21 NOTE — ED Notes (Signed)
Patient verbalizes understanding of discharge instructions. Opportunity for questioning and answers were provided. pt discharged from ED with [facility].     

## 2019-04-21 NOTE — ED Triage Notes (Signed)
Pt brought to ED by [facility] from [facility]. Pt reported chest pain 10/10. Staff reported the pt was lethargic and had a BP of 88/52. Pt uses 4L [place] PRN. EMS applied 4L [place] after SpO2 reading of 90%. EMS gave pt 325 aspirin en route. Pain is currently rated 7/10. Pt is A&O x4.

## 2019-04-23 ENCOUNTER — Inpatient Hospital Stay (HOSPITAL_COMMUNITY)
Admission: EM | Admit: 2019-04-23 | Discharge: 2019-04-26 | DRG: 683 | Disposition: A | Discharge status: Home Health | Payer: Medicare Other | Attending: Family Medicine | Admitting: Family Medicine

## 2019-04-23 DIAGNOSIS — F419 Anxiety disorder, unspecified: Secondary | ICD-10-CM | POA: Diagnosis present

## 2019-04-23 DIAGNOSIS — I129 Hypertensive chronic kidney disease with stage 1 through stage 4 chronic kidney disease, or unspecified chronic kidney disease: Secondary | ICD-10-CM | POA: Diagnosis present

## 2019-04-23 DIAGNOSIS — J42 Unspecified chronic bronchitis: Secondary | ICD-10-CM | POA: Diagnosis present

## 2019-04-23 DIAGNOSIS — Z888 Allergy status to other drugs, medicaments and biological substances status: Secondary | ICD-10-CM

## 2019-04-23 DIAGNOSIS — E860 Dehydration: Secondary | ICD-10-CM | POA: Diagnosis present

## 2019-04-23 DIAGNOSIS — E119 Type 2 diabetes mellitus without complications: Secondary | ICD-10-CM

## 2019-04-23 DIAGNOSIS — Y929 Unspecified place or not applicable: Secondary | ICD-10-CM

## 2019-04-23 DIAGNOSIS — N179 Acute kidney failure, unspecified: Principal | ICD-10-CM | POA: Diagnosis present

## 2019-04-23 DIAGNOSIS — R531 Weakness: Secondary | ICD-10-CM | POA: Diagnosis not present

## 2019-04-23 DIAGNOSIS — R4182 Altered mental status, unspecified: Secondary | ICD-10-CM | POA: Diagnosis present

## 2019-04-23 DIAGNOSIS — Z981 Arthrodesis status: Secondary | ICD-10-CM

## 2019-04-23 DIAGNOSIS — M109 Gout, unspecified: Secondary | ICD-10-CM | POA: Diagnosis present

## 2019-04-23 DIAGNOSIS — Z79891 Long term (current) use of opiate analgesic: Secondary | ICD-10-CM

## 2019-04-23 DIAGNOSIS — E875 Hyperkalemia: Secondary | ICD-10-CM | POA: Diagnosis not present

## 2019-04-23 DIAGNOSIS — Z91048 Other nonmedicinal substance allergy status: Secondary | ICD-10-CM

## 2019-04-23 DIAGNOSIS — Z9981 Dependence on supplemental oxygen: Secondary | ICD-10-CM

## 2019-04-23 DIAGNOSIS — Z6841 Body Mass Index (BMI) 40.0 and over, adult: Secondary | ICD-10-CM | POA: Diagnosis not present

## 2019-04-23 DIAGNOSIS — Z20828 Contact with and (suspected) exposure to other viral communicable diseases: Secondary | ICD-10-CM | POA: Diagnosis present

## 2019-04-23 DIAGNOSIS — K219 Gastro-esophageal reflux disease without esophagitis: Secondary | ICD-10-CM | POA: Diagnosis present

## 2019-04-23 DIAGNOSIS — Z79899 Other long term (current) drug therapy: Secondary | ICD-10-CM

## 2019-04-23 DIAGNOSIS — J309 Allergic rhinitis, unspecified: Secondary | ICD-10-CM | POA: Diagnosis present

## 2019-04-23 DIAGNOSIS — Z87891 Personal history of nicotine dependence: Secondary | ICD-10-CM

## 2019-04-23 DIAGNOSIS — F329 Major depressive disorder, single episode, unspecified: Secondary | ICD-10-CM | POA: Diagnosis present

## 2019-04-23 DIAGNOSIS — I10 Essential (primary) hypertension: Secondary | ICD-10-CM | POA: Diagnosis present

## 2019-04-23 DIAGNOSIS — D631 Anemia in chronic kidney disease: Secondary | ICD-10-CM | POA: Diagnosis present

## 2019-04-23 DIAGNOSIS — N183 Chronic kidney disease, stage 3 unspecified: Secondary | ICD-10-CM | POA: Diagnosis present

## 2019-04-23 DIAGNOSIS — Z8371 Family history of colonic polyps: Secondary | ICD-10-CM

## 2019-04-23 DIAGNOSIS — Z7952 Long term (current) use of systemic steroids: Secondary | ICD-10-CM

## 2019-04-23 DIAGNOSIS — T426X5A Adverse effect of other antiepileptic and sedative-hypnotic drugs, initial encounter: Secondary | ICD-10-CM | POA: Diagnosis not present

## 2019-04-23 DIAGNOSIS — Z96653 Presence of artificial knee joint, bilateral: Secondary | ICD-10-CM | POA: Diagnosis present

## 2019-04-23 DIAGNOSIS — Z8249 Family history of ischemic heart disease and other diseases of the circulatory system: Secondary | ICD-10-CM

## 2019-04-23 DIAGNOSIS — J9811 Atelectasis: Secondary | ICD-10-CM | POA: Diagnosis not present

## 2019-04-23 DIAGNOSIS — E1122 Type 2 diabetes mellitus with diabetic chronic kidney disease: Secondary | ICD-10-CM | POA: Diagnosis present

## 2019-04-23 DIAGNOSIS — I959 Hypotension, unspecified: Secondary | ICD-10-CM | POA: Diagnosis not present

## 2019-04-23 DIAGNOSIS — Z794 Long term (current) use of insulin: Secondary | ICD-10-CM

## 2019-04-23 DIAGNOSIS — Z800 Family history of malignant neoplasm of digestive organs: Secondary | ICD-10-CM

## 2019-04-23 DIAGNOSIS — Z880 Allergy status to penicillin: Secondary | ICD-10-CM

## 2019-04-23 DIAGNOSIS — G4733 Obstructive sleep apnea (adult) (pediatric): Secondary | ICD-10-CM | POA: Diagnosis present

## 2019-04-23 DIAGNOSIS — G8929 Other chronic pain: Secondary | ICD-10-CM | POA: Diagnosis present

## 2019-04-23 DIAGNOSIS — Z833 Family history of diabetes mellitus: Secondary | ICD-10-CM

## 2019-04-23 DIAGNOSIS — U071 COVID-19: Secondary | ICD-10-CM | POA: Diagnosis not present

## 2019-04-23 DIAGNOSIS — T402X5A Adverse effect of other opioids, initial encounter: Secondary | ICD-10-CM | POA: Diagnosis present

## 2019-04-23 DIAGNOSIS — M549 Dorsalgia, unspecified: Secondary | ICD-10-CM | POA: Diagnosis present

## 2019-04-23 DIAGNOSIS — E1142 Type 2 diabetes mellitus with diabetic polyneuropathy: Secondary | ICD-10-CM | POA: Diagnosis present

## 2019-04-23 LAB — POCT I-STAT 7, (LYTES, BLD GAS, ICA,H+H)
Acid-Base Excess: 5 mmol/L — ABNORMAL HIGH (ref 0.0–2.0)
Bicarbonate: 32 mmol/L — ABNORMAL HIGH (ref 20.0–28.0)
Calcium, Ion: 1.32 mmol/L (ref 1.15–1.40)
HCT: 37 % (ref 36.0–46.0)
Hemoglobin: 12.6 g/dL (ref 12.0–15.0)
O2 Saturation: 98 %
Patient temperature: 98.1
Potassium: 5.4 mmol/L — ABNORMAL HIGH (ref 3.5–5.1)
Sodium: 133 mmol/L — ABNORMAL LOW (ref 135–145)
TCO2: 34 mmol/L — ABNORMAL HIGH (ref 22–32)
pCO2 arterial: 56.7 mmHg — ABNORMAL HIGH (ref 32.0–48.0)
pH, Arterial: 7.358 (ref 7.350–7.450)
pO2, Arterial: 120 mmHg — ABNORMAL HIGH (ref 83.0–108.0)

## 2019-04-23 LAB — CBG MONITORING, ED: Glucose-Capillary: 266 mg/dL — ABNORMAL HIGH (ref 70–99)

## 2019-04-23 NOTE — ED Triage Notes (Signed)
Pt to ED vis [facility] from facility for "sleepiness"

## 2019-04-23 NOTE — ED Provider Notes (Signed)
[facility] EMERGENCY DEPARTMENT Provider Note   CSN: [id number] Arrival date & time: 04/23/19  2223     History   Chief Complaint Chief Complaint  Patient presents with  . drowsy    HPI NADEA RANSOM is a 68 y.o. female.     Patient presents from skilled nursing facility.  She was sent to the emergency department.  Evaluate drowsiness.  Patient reports that she has been very sleepy all day today, nodding off frequently.  She reports that she has mostly slept today and this is very unusual for her.  She has not felt ill.  She denies fever, cough, chills, shortness of breath.  She has not had any chest pain, abdominal pain, vomiting, diarrhea and denies urinary symptoms.     Past Medical History:  Diagnosis Date  . Allergy   . Anemia, unspecified   . Anxiety   . Arthritis    "neck, back, hands" (09/03/2014)  . Chronic airway obstruction, not elsewhere classified    "I was told I don't have this/tests done @ [facility] 05/2014"  . Chronic back pain   . Colon polyps   . Complication of anesthesia    "I've had recall; I probably stopped breathing at least 2 times" (09/03/2014)  . Dehydration   . Depressive disorder, not elsewhere classified   . Diabetic peripheral neuropathy ([place])   . Dysphagia 2007   historyof dysphagia with severe dysmotility by barium swallow-[name]  . Esophageal reflux   . Family history of adverse reaction to anesthesia    "my brother was told they lost him a couple times during OR"  . HCAP (healthcare-associated pneumonia) 03/25/2018  . Heart murmur   . History of hiatal hernia   . Migraine    "couple times/month right now" (09/03/2014)  . Obesity, unspecified   . OSA (obstructive sleep apnea)     CPAP  . Septic shock ([place]) 08/30/2017  . Spondylosis of unspecified site without mention of myelopathy   . Type II diabetes mellitus ([place])   . Unspecified essential hypertension   . Unspecified menopausal and postmenopausal  disorder   . Unspecified vitamin D deficiency   . Upper airway cough syndrome     Patient Active Problem List   Diagnosis Date Noted  . Fungal skin infection 03/25/2018  . Thoracic spondylosis with myelopathy 02/17/2018  . Spondylosis, thoracic, with myelopathy 02/17/2018  . Dehydration 02/11/2018  . AKI (acute kidney injury) ([place]) 02/11/2018  . Hypotension 02/11/2018  . Syncope, vasovagal 02/11/2018  . Syncope 02/11/2018  . DM neuropathy, type II diabetes mellitus ([place]) 11/18/2017  . Weakness 11/18/2017  . SIRS (systemic inflammatory response syndrome) ([place]) 11/17/2017  . Diabetes mellitus type 2 in obese ([place]) 11/17/2017  . Acute lower UTI 11/17/2017  . Acute renal failure (ARF) ([place]) 08/13/2017  . Pressure injury of skin 08/13/2017  . Sepsis due to gram-negative UTI ([place]) 08/12/2017  . Upper airway cough syndrome 09/28/2016  . Acute bronchitis 10/29/2015  . Diabetes ([place]) 08/21/2015  . Chest pain 07/14/2015  . Dyspnea on exertion 07/05/2015  . Obstructive sleep apnea 07/05/2015  . Constipation 03/05/2015  . Allergic rhinitis 03/05/2015  . Hearing loss 03/05/2015  . Vocal cord dysfunction 03/05/2015  . Pain in joint, shoulder region 09/11/2014  . Weakness generalized 09/02/2014  . Acute esophagitis 05/08/2014  . Benign neoplasm of sigmoid colon 05/08/2014  . Change in bowel habits 04/17/2014  . Fecal incontinence 04/17/2014  . Urge incontinence 04/08/2014  . Cough  12/06/2013  . Orthostasis 07/18/2013  . Acute kidney injury ([place]) 07/18/2013  . Bilateral carpal tunnel syndrome 02/14/2013  . Gout 01/04/2013  . Failure to thrive 06/28/2012  . Gait disorder 06/28/2012  . Lower abdominal pain 12/14/2011  . Preventative health care 08/23/2011  . Cervical radiculopathy 02/11/2011  . Low back pain 02/11/2011  . VITAMIN D DEFICIENCY 11/22/2009  . ANEMIA-NOS 11/22/2009  . DEGENERATIVE JOINT DISEASE, CERVICAL SPINE 11/22/2009  . Depression 09/16/2009  . POLYNEUROPATHY  09/16/2009  . Morbid obesity ([place]) 09/09/2009  . Essential hypertension 09/09/2009  . GERD 09/09/2009  . Sleep apnea 09/09/2009    Past Surgical History:  Procedure Laterality Date  . ABDOMINAL HYSTERECTOMY     "partial"  . ANTERIOR CERVICAL DECOMP/DISCECTOMY FUSION     multiple cervical spine levels;Dr. Arnoldo Morale  . APPENDECTOMY    . BACK SURGERY    . CHOLECYSTECTOMY OPEN    . COLONOSCOPY N/A 05/08/2014   Procedure: COLONOSCOPY;  Surgeon: [name], MD;  Location: [facility] END[facility]COPY;  Service: Endoscopy;  Laterality: N/A;  . DILATION AND CURETTAGE OF UTERUS    . ESOPHAGOGASTRODUODENOSCOPY N/A 05/08/2014   Procedure: ESOPHAGOGASTRODUODENOSCOPY (EGD);  Surgeon: [name], MD;  Location: [name];  Service: Endoscopy;  Laterality: N/A;  . EXPLORATORY LAPAROTOMY    . EYE SURGERY    . FOOT SURGERY Right X 2   "took blood out of my arms and put platelets in my feet"  . INCISION AND DRAINAGE ABSCESS     Chest  . JOINT REPLACEMENT    . REFRACTIVE SURGERY Bilateral   . THORACIC DISCECTOMY N/A 02/17/2018   Procedure: THORACIC ONE - THORACIC TWO LAMINECTOMY;  Surgeon: Newman Pies, MD;  Location: [facility];  Service: Neurosurgery;  Laterality: N/A;  THORACIC ONE - THORACIC TWO LAMINECTOMY  . TONSILLECTOMY    . TOTAL KNEE ARTHROPLASTY Left 10/2003  . TOTAL KNEE ARTHROPLASTY Right 01/2004   [name]     OB History   No obstetric history on file.      Home Medications    Prior to Admission medications   Medication Sig Start Date End Date Taking? Authorizing Provider  allopurinol (ZYLOPRIM) 300 MG tablet Take 300 mg by mouth daily.    Yes [provider]  benzonatate (TESSALON) 100 MG capsule Take 100 mg by mouth at bedtime.    Yes [provider]  Cholecalciferol (VITAMIN D-3) 1000 units CAPS Take 2,000 Units by mouth daily.    Yes [provider]  cyclobenzaprine (FLEXERIL) 10 MG tablet Take 1 tablet (10 mg total) by mouth 3 (three) times daily as  needed for muscle spasms. 02/22/18  Yes Regalado, Belkys A, MD  cycloSPORINE (RESTASIS) 0.05 % ophthalmic emulsion Place 1 drop into both eyes 2 (two) times daily.    Yes [provider]  docusate sodium (COLACE) 100 MG capsule Take 1 capsule (100 mg total) by mouth 2 (two) times daily. 02/19/18  Yes Regalado, Belkys A, MD  Dulaglutide (TRULICITY) 1.5 0000000 SOPN Inject 1.5 mg into the skin every Friday.   Yes [provider]  fluticasone (FLONASE) 50 MCG/ACT nasal spray SHAKE LIQUID AND USE 2 SPRAYS IN EACH NOSTRIL DAILY Patient taking differently: Place 2 sprays into both nostrils daily.  03/08/17  Yes Collene Gobble, MD  gabapentin (NEURONTIN) 300 MG capsule Take 900 mg by mouth 3 (three) times daily.  11/14/18  Yes [provider]  guaiFENesin (MUCINEX) 600 MG 12 hr tablet Take 600 mg by mouth 2 (  two) times daily.   Yes [provider]  hydrochlorothiazide (MICROZIDE) 12.5 MG capsule Take 12.5 mg by mouth daily.    Yes [provider]  insulin aspart (NOVOLOG) 100 UNIT/ML injection Inject 12 Units into the skin 3 (three) times daily with meals.    Yes [provider]  insulin detemir (LEVEMIR) 100 UNIT/ML injection Inject 40 Units into the skin 2 (two) times daily.   Yes [provider]  irbesartan (AVAPRO) 300 MG tablet Take 300 mg by mouth daily.    Yes [provider]  Lidocaine (ASPERCREME LIDOCAINE) 4 % PTCH Place 1 patch onto the skin 2 (two) times daily.   Yes [provider]  loratadine (CLARITIN) 10 MG tablet Take 10 mg by mouth daily.    Yes [provider]  metoprolol tartrate (LOPRESSOR) 100 MG tablet Take 100 mg by mouth 2 (two) times daily.    Yes [provider]  montelukast (SINGULAIR) 10 MG tablet TAKE 1 TABLET(10 MG) BY MOUTH DAILY Patient taking differently: Take 10 mg by mouth daily.  04/03/16  Yes Magdalen Spatz, NP  Multiple Vitamins-Minerals (MULTIVITAMIN WITH MINERALS) tablet  Take 1 tablet by mouth at bedtime.    Yes [provider]  oxybutynin (DITROPAN XL) 15 MG 24 hr tablet Take 15 mg by mouth every evening.  12/02/18  Yes [provider]  pantoprazole (PROTONIX) 40 MG tablet TAKE 1 TABLET(40 MG) BY MOUTH TWICE DAILY Patient taking differently: Take 40 mg by mouth 2 (two) times daily.  03/02/17  Yes Collene Gobble, MD  polycarbophil (FIBERCON) 625 MG tablet Take 625 mg by mouth 2 (two) times daily.    Yes [provider]  polyethylene glycol (MIRALAX / GLYCOLAX) packet Take 17 g by mouth 2 (two) times daily. Patient taking differently: Take 17 g by mouth daily. Min with 4-8 oz beverage and drink 02/22/18  Yes Regalado, Belkys A, MD  predniSONE (DELTASONE) 20 MG tablet Take 20 mg by mouth daily with breakfast.    Yes [provider]  senna (SENOKOT) 8.6 MG TABS tablet Take 1 tablet (8.6 mg total) by mouth daily. Patient taking differently: Take 1 tablet by mouth at bedtime.  02/23/18  Yes Regalado, Belkys A, MD  sodium chloride (OCEAN) 0.65 % SOLN nasal spray Place 2 sprays into both nostrils 2 (two) times daily.   Yes [provider]  triamcinolone cream (KENALOG) 0.1 % Apply 1 application topically See admin instructions. Apply 2 grams topically to bilateral forearms twice daily   Yes [provider]  HYDROcodone-acetaminophen (NORCO/VICODIN) 5-325 MG tablet Take 2 tablets by mouth every 6 (six) hours as needed (pain). Patient not taking: Reported on 04/24/2019 03/28/18   Caren Griffins, MD  Insulin Syringe-Needle U-100 (INSULIN SYRINGE 1CC/31GX5/16") 31G X 5/16" 1 ML MISC USE TO INJECT INSULIN TWICE DAILY 05/11/17   Renato Shin, MD  meclizine (ANTIVERT) 25 MG tablet Take 1 tablet (25 mg total) by mouth 3 (three) times daily as needed for dizziness. Patient not taking: Reported on 04/21/2019 02/06/18   Dorie Rank, MD  [facility] LANCETS MISC Use to check sugars twice daily DX E11.22 08/23/17   Renato Shin, MD  OXYGEN  Inhale 2 L/min into the lungs.    [provider]  PRESCRIPTION MEDICATION Inhale into the lungs at bedtime. CPAP    [provider]    Family History Family History  Problem Relation Age of Onset  . Esophageal cancer Brother   .  Esophageal cancer Sister        ?  . Colon polyps Brother   . Pancreatic cancer Sister        ?  . Diabetes Sister   . Diabetes Other        Aunt and Uncle  . Heart disease Maternal Grandfather     Social History Social History   Tobacco Use  . Smoking status: Former Smoker    Packs/day: 1.00    Years: 20.00    Pack years: 20.00    Types: Cigarettes    Quit date: 06/22/1986    Years since quitting: 32.8  . Smokeless tobacco: Never Used  Substance Use Topics  . Alcohol use: Yes  . Drug use: No     Allergies   Ace inhibitors, Penicillins, and Adhesive [tape]   Review of Systems Review of Systems  Constitutional: Positive for fatigue.  All other systems reviewed and are negative.    Physical Exam Updated Vital Signs BP 117/74   Pulse 87   Resp (!) 9   SpO2 100%   Physical Exam Vitals signs and nursing note reviewed.  Constitutional:      General: She is not in acute distress.    Appearance: Normal appearance. She is well-developed.     Comments: Patient sleeping when I entered the room, wakes up when I call her name and answers questions appropriately  HENT:     Head: Normocephalic and atraumatic.     Right Ear: Hearing normal.     Left Ear: Hearing normal.     Nose: Nose normal.  Eyes:     Conjunctiva/sclera: Conjunctivae normal.     Pupils: Pupils are equal, round, and reactive to light.  Neck:     Musculoskeletal: Normal range of motion and neck supple.  Cardiovascular:     Rate and Rhythm: Regular rhythm.     Heart sounds: S1 normal and S2 normal. No murmur. No friction rub. No gallop.   Pulmonary:     Effort: Pulmonary effort is normal. No respiratory distress.     Breath sounds: Normal breath  sounds.  Chest:     Chest wall: No tenderness.  Abdominal:     General: Bowel sounds are normal.     Palpations: Abdomen is soft.     Tenderness: There is no abdominal tenderness. There is no guarding or rebound. Negative signs include Murphy's sign and McBurney's sign.     Hernia: No hernia is present.  Musculoskeletal: Normal range of motion.  Skin:    General: Skin is warm and dry.     Findings: No rash.  Neurological:     Mental Status: She is oriented to person, place, and time.     GCS: GCS eye subscore is 4. GCS verbal subscore is 5. GCS motor subscore is 6.     Cranial Nerves: No cranial nerve deficit.     Sensory: No sensory deficit.     Coordination: Coordination normal.  Psychiatric:        Speech: Speech normal.        Behavior: Behavior normal.        Thought Content: Thought content normal.      ED Treatments / Results  Labs (all labs ordered are listed, but only abnormal results are displayed) Labs Reviewed  COMPREHENSIVE METABOLIC PANEL - Abnormal; Notable for the following components:      Result Value   Potassium 5.6 (*)    Chloride 96 (*)  Glucose, Bld 266 (*)    BUN 52 (*)    Creatinine, Ser 2.32 (*)    Albumin 2.9 (*)    AST 10 (*)    GFR calc non Af Amer 21 (*)    GFR calc Af Amer 24 (*)    All other components within normal limits  URINALYSIS, ROUTINE W REFLEX MICROSCOPIC - Abnormal; Notable for the following components:   Glucose, UA 50 (*)    All other components within normal limits  CBG MONITORING, ED - Abnormal; Notable for the following components:   Glucose-Capillary 266 (*)    All other components within normal limits  POCT I-STAT 7, (LYTES, BLD GAS, ICA,H+H) - Abnormal; Notable for the following components:   pCO2 arterial 56.7 (*)    pO2, Arterial 120.0 (*)    Bicarbonate 32.0 (*)    TCO2 34 (*)    Acid-Base Excess 5.0 (*)    Sodium 133 (*)    Potassium 5.4 (*)    All other components within normal limits  SARS CORONAVIRUS 2  (TAT 6-24 HRS)  CBC WITH DIFFERENTIAL/PLATELET  AMMONIA  I-STAT ARTERIAL BLOOD GAS, ED    EKG EKG Interpretation  Date/Time:  Sunday April 23 2019 22:50:20 EST Ventricular Rate:  89 PR Interval:    QRS Duration: 84 QT Interval:  354 QTC Calculation: 431 R Axis:   -3 Text Interpretation: Sinus rhythm Low voltage, precordial leads No significant change since last tracing Confirmed by Orpah Greek [id number]) on 04/23/2019 11:33:37 PM   Radiology Dg Chest Port 1 View  Result Date: 04/24/2019 CLINICAL DATA:  Weakness EXAM: PORTABLE CHEST 1 VIEW COMPARISON:  04/21/2019 FINDINGS: The heart size is enlarged. The lung volumes are low. There is bibasilar atelectasis. No pneumothorax. There is some vascular congestion without overt pulmonary edema. Patient is status post prior ACDF of the lower cervical spine. IMPRESSION: 1. Low lung volumes with bibasilar atelectasis. In infiltrate is difficult to entirely exclude. 2. Cardiomegaly.  Vascular congestion is noted. Electronically Signed   By: Constance Holster M.D.   On: 04/24/2019 00:51    Procedures Procedures (including critical care time)  Medications Ordered in ED Medications - No data to display   Initial Impression / Assessment and Plan / ED Course  I have reviewed the triage vital signs and the nursing notes.  Pertinent labs & imaging results that were available during my care of the patient were reviewed by me and considered in my medical decision making (see chart for details).        Patient presents to the emergency department for evaluation of fatigue.  Patient reports that she has been unable to stay awake all day today.  She has been feeling very weak in general.  She has not had any fever.  She denies cough, infectious symptoms.  She was seen in the ER 2 days ago for chest pain, this has resolved.  She has no specific complaints other than the somnolence.  She does have essentially normal vital signs.  CBC is  unremarkable.  Urinalysis does not suggest infection.  Blood gas does show mild hypercarbia with a PCO2 of 56.  Chest x-ray does not show any evidence of infection or active congestive heart failure.  Patient's chemistry shows acute kidney injury with mild hyperkalemia.  There are no EKG changes associated with hyperkalemia.  She has had AKI's in the past, however 2 days ago her creatinine was 1.1.  This is a significant acute change.  Somnolence  and mental status changes are likely multifactorial including mild hypercarbia, uremia and dehydration.  Will initiate IV hydration and will require hospitalization for further management of AKI.  Final Clinical Impressions(s) / ED Diagnoses   Final diagnoses:  AKI (acute kidney injury) [facility])  Hyperkalemia    ED Discharge Orders    None       Orpah Greek, MD 04/24/19 [phone]

## 2019-04-24 ENCOUNTER — Observation Stay (HOSPITAL_COMMUNITY): Payer: Medicare Other

## 2019-04-24 ENCOUNTER — Encounter (HOSPITAL_COMMUNITY): Payer: Self-pay

## 2019-04-24 ENCOUNTER — Other Ambulatory Visit: Payer: Self-pay

## 2019-04-24 ENCOUNTER — Emergency Department (HOSPITAL_COMMUNITY): Payer: Medicare Other

## 2019-04-24 DIAGNOSIS — N183 Chronic kidney disease, stage 3 unspecified: Secondary | ICD-10-CM

## 2019-04-24 DIAGNOSIS — N179 Acute kidney failure, unspecified: Secondary | ICD-10-CM | POA: Diagnosis not present

## 2019-04-24 DIAGNOSIS — E1122 Type 2 diabetes mellitus with diabetic chronic kidney disease: Secondary | ICD-10-CM

## 2019-04-24 DIAGNOSIS — I10 Essential (primary) hypertension: Secondary | ICD-10-CM

## 2019-04-24 DIAGNOSIS — R4020 Unspecified coma: Secondary | ICD-10-CM | POA: Diagnosis not present

## 2019-04-24 DIAGNOSIS — R4182 Altered mental status, unspecified: Secondary | ICD-10-CM | POA: Diagnosis not present

## 2019-04-24 DIAGNOSIS — J9811 Atelectasis: Secondary | ICD-10-CM | POA: Diagnosis not present

## 2019-04-24 DIAGNOSIS — Z794 Long term (current) use of insulin: Secondary | ICD-10-CM

## 2019-04-24 DIAGNOSIS — E875 Hyperkalemia: Secondary | ICD-10-CM

## 2019-04-24 LAB — MRSA PCR SCREENING: MRSA by PCR: POSITIVE — AB

## 2019-04-24 LAB — URINALYSIS, ROUTINE W REFLEX MICROSCOPIC
Bilirubin Urine: NEGATIVE
Glucose, UA: 50 mg/dL — AB
Hgb urine dipstick: NEGATIVE
Ketones, ur: NEGATIVE mg/dL
Leukocytes,Ua: NEGATIVE
Nitrite: NEGATIVE
Protein, ur: NEGATIVE mg/dL
Specific Gravity, Urine: 1.014 (ref 1.005–1.030)
pH: 5 (ref 5.0–8.0)

## 2019-04-24 LAB — COMPREHENSIVE METABOLIC PANEL
ALT: 22 U/L (ref 0–44)
AST: 10 U/L — ABNORMAL LOW (ref 15–41)
Albumin: 2.9 g/dL — ABNORMAL LOW (ref 3.5–5.0)
Alkaline Phosphatase: 67 U/L (ref 38–126)
Anion gap: 11 (ref 5–15)
BUN: 52 mg/dL — ABNORMAL HIGH (ref 8–23)
CO2: 28 mmol/L (ref 22–32)
Calcium: 9.7 mg/dL (ref 8.9–10.3)
Chloride: 96 mmol/L — ABNORMAL LOW (ref 98–111)
Creatinine, Ser: 2.32 mg/dL — ABNORMAL HIGH (ref 0.44–1.00)
GFR calc Af Amer: 24 mL/min — ABNORMAL LOW (ref >60)
GFR calc non Af Amer: 21 mL/min — ABNORMAL LOW (ref >60)
Glucose, Bld: 266 mg/dL — ABNORMAL HIGH (ref 70–99)
Potassium: 5.6 mmol/L — ABNORMAL HIGH (ref 3.5–5.1)
Sodium: 135 mmol/L (ref 135–145)
Total Bilirubin: 0.8 mg/dL (ref 0.3–1.2)
Total Protein: 6.6 g/dL (ref 6.5–8.1)

## 2019-04-24 LAB — CBC WITH DIFFERENTIAL/PLATELET
Abs Immature Granulocytes: 0.04 10*3/uL (ref 0.00–0.07)
Basophils Absolute: 0 10*3/uL (ref 0.0–0.1)
Basophils Relative: 0 %
Eosinophils Absolute: 0 10*3/uL (ref 0.0–0.5)
Eosinophils Relative: 0 %
HCT: 40.2 % (ref 36.0–46.0)
Hemoglobin: 12.2 g/dL (ref 12.0–15.0)
Immature Granulocytes: 1 %
Lymphocytes Relative: 15 %
Lymphs Abs: 1.3 10*3/uL (ref 0.7–4.0)
MCH: 29.2 pg (ref 26.0–34.0)
MCHC: 30.3 g/dL (ref 30.0–36.0)
MCV: 96.2 fL (ref 80.0–100.0)
Monocytes Absolute: 0.5 10*3/uL (ref 0.1–1.0)
Monocytes Relative: 6 %
Neutro Abs: 6.8 10*3/uL (ref 1.7–7.7)
Neutrophils Relative %: 78 %
Platelets: 190 10*3/uL (ref 150–400)
RBC: 4.18 MIL/uL (ref 3.87–5.11)
RDW: 14.7 % (ref 11.5–15.5)
WBC: 8.7 10*3/uL (ref 4.0–10.5)
nRBC: 0 % (ref 0.0–0.2)

## 2019-04-24 LAB — GLUCOSE, CAPILLARY
Glucose-Capillary: 354 mg/dL — ABNORMAL HIGH (ref 70–99)
Glucose-Capillary: 368 mg/dL — ABNORMAL HIGH (ref 70–99)

## 2019-04-24 LAB — CBG MONITORING, ED
Glucose-Capillary: 206 mg/dL — ABNORMAL HIGH (ref 70–99)
Glucose-Capillary: 207 mg/dL — ABNORMAL HIGH (ref 70–99)

## 2019-04-24 LAB — AMMONIA: Ammonia: 31 umol/L (ref 9–35)

## 2019-04-24 LAB — SARS CORONAVIRUS 2 (TAT 6-24 HRS): SARS Coronavirus 2: NEGATIVE

## 2019-04-24 MED ORDER — LORATADINE 10 MG PO TABS
10.0000 mg | ORAL_TABLET | Freq: Every day | ORAL | Status: DC
  Administered 2019-04-24 – 2019-04-26 (×3): 10 mg via ORAL
  Filled 2019-04-24 (×3): qty 1

## 2019-04-24 MED ORDER — INSULIN ASPART 100 UNIT/ML ~~LOC~~ SOLN
0.0000 [IU] | Freq: Three times a day (TID) | SUBCUTANEOUS | Status: DC
  Administered 2019-04-24 (×2): 3 [IU] via SUBCUTANEOUS
  Administered 2019-04-24: 18:00:00 9 [IU] via SUBCUTANEOUS
  Administered 2019-04-25: 3 [IU] via SUBCUTANEOUS
  Administered 2019-04-25: 9 [IU] via SUBCUTANEOUS
  Administered 2019-04-25: 7 [IU] via SUBCUTANEOUS
  Administered 2019-04-26: 2 [IU] via SUBCUTANEOUS
  Administered 2019-04-26: 7 [IU] via SUBCUTANEOUS
  Administered 2019-04-26: 13:00:00 5 [IU] via SUBCUTANEOUS

## 2019-04-24 MED ORDER — GUAIFENESIN ER 600 MG PO TB12
600.0000 mg | ORAL_TABLET | Freq: Two times a day (BID) | ORAL | Status: DC
  Administered 2019-04-24 – 2019-04-26 (×5): 600 mg via ORAL
  Filled 2019-04-24 (×5): qty 1

## 2019-04-24 MED ORDER — PREDNISONE 20 MG PO TABS
20.0000 mg | ORAL_TABLET | Freq: Every day | ORAL | Status: DC
  Administered 2019-04-25 – 2019-04-26 (×2): 20 mg via ORAL
  Filled 2019-04-24 (×2): qty 1

## 2019-04-24 MED ORDER — CALCIUM POLYCARBOPHIL 625 MG PO TABS
625.0000 mg | ORAL_TABLET | Freq: Two times a day (BID) | ORAL | Status: DC
  Administered 2019-04-24 – 2019-04-26 (×5): 625 mg via ORAL
  Filled 2019-04-24 (×5): qty 1

## 2019-04-24 MED ORDER — INSULIN DETEMIR 100 UNIT/ML ~~LOC~~ SOLN
40.0000 [IU] | Freq: Two times a day (BID) | SUBCUTANEOUS | Status: DC
  Administered 2019-04-24: 22:00:00 40 [IU] via SUBCUTANEOUS
  Filled 2019-04-24 (×6): qty 0.4

## 2019-04-24 MED ORDER — SODIUM CHLORIDE 0.9 % IV SOLN: Freq: Once | INTRAVENOUS | Status: DC

## 2019-04-24 MED ORDER — SODIUM BICARBONATE 8.4 % IV SOLN
50.0000 meq | Freq: Once | INTRAVENOUS | Status: AC
  Administered 2019-04-24: 10:00:00 50 meq via INTRAVENOUS
  Filled 2019-04-24: qty 50

## 2019-04-24 MED ORDER — CHLORHEXIDINE GLUCONATE CLOTH 2 % EX PADS
6.0000 | MEDICATED_PAD | Freq: Every day | CUTANEOUS | Status: DC
  Administered 2019-04-25 – 2019-04-26 (×2): 6 via TOPICAL

## 2019-04-24 MED ORDER — BENZONATATE 100 MG PO CAPS
100.0000 mg | ORAL_CAPSULE | Freq: Every day | ORAL | Status: DC
  Administered 2019-04-24 – 2019-04-26 (×3): 100 mg via ORAL
  Filled 2019-04-24 (×3): qty 1

## 2019-04-24 MED ORDER — SALINE SPRAY 0.65 % NA SOLN
2.0000 | Freq: Two times a day (BID) | NASAL | Status: DC
  Administered 2019-04-24 – 2019-04-26 (×3): 2 via NASAL
  Filled 2019-04-24: qty 44

## 2019-04-24 MED ORDER — PRO-STAT SUGAR FREE PO LIQD
30.0000 mL | Freq: Two times a day (BID) | ORAL | Status: DC
  Administered 2019-04-24 – 2019-04-25 (×4): 30 mL via ORAL
  Filled 2019-04-24 (×7): qty 30

## 2019-04-24 MED ORDER — ALLOPURINOL 100 MG PO TABS
100.0000 mg | ORAL_TABLET | Freq: Every day | ORAL | Status: DC
  Administered 2019-04-24 – 2019-04-26 (×3): 100 mg via ORAL
  Filled 2019-04-24 (×3): qty 1

## 2019-04-24 MED ORDER — POLYETHYLENE GLYCOL 3350 17 G PO PACK
17.0000 g | PACK | Freq: Every day | ORAL | Status: DC
  Administered 2019-04-26: 17 g via ORAL
  Filled 2019-04-24: qty 1

## 2019-04-24 MED ORDER — CYCLOBENZAPRINE HCL 10 MG PO TABS
10.0000 mg | ORAL_TABLET | Freq: Three times a day (TID) | ORAL | Status: DC | PRN
  Administered 2019-04-24 – 2019-04-26 (×5): 10 mg via ORAL
  Filled 2019-04-24 (×5): qty 1

## 2019-04-24 MED ORDER — MONTELUKAST SODIUM 10 MG PO TABS
10.0000 mg | ORAL_TABLET | Freq: Every day | ORAL | Status: DC
  Administered 2019-04-24 – 2019-04-26 (×3): 10 mg via ORAL
  Filled 2019-04-24 (×3): qty 1

## 2019-04-24 MED ORDER — SODIUM CHLORIDE 0.9 % IV BOLUS
500.0000 mL | Freq: Once | INTRAVENOUS | Status: AC
  Administered 2019-04-24: 04:00:00 500 mL via INTRAVENOUS

## 2019-04-24 MED ORDER — LIDOCAINE 5 % EX PTCH
1.0000 | MEDICATED_PATCH | CUTANEOUS | Status: DC
  Administered 2019-04-24 – 2019-04-26 (×3): 1 via TRANSDERMAL
  Filled 2019-04-24 (×3): qty 1

## 2019-04-24 MED ORDER — SODIUM CHLORIDE 0.9 % IV SOLN: INTRAVENOUS | Status: DC

## 2019-04-24 MED ORDER — HYDROCODONE-ACETAMINOPHEN 7.5-325 MG PO TABS
1.0000 | ORAL_TABLET | Freq: Once | ORAL | Status: AC
  Administered 2019-04-24: 1 via ORAL
  Filled 2019-04-24: qty 1

## 2019-04-24 MED ORDER — FLUTICASONE PROPIONATE 50 MCG/ACT NA SUSP
2.0000 | Freq: Every day | NASAL | Status: DC
  Administered 2019-04-24 – 2019-04-26 (×3): 2 via NASAL
  Filled 2019-04-24: qty 16

## 2019-04-24 MED ORDER — CALCIUM GLUCONATE-NACL 1-0.675 GM/50ML-% IV SOLN
1.0000 g | Freq: Once | INTRAVENOUS | Status: AC
  Administered 2019-04-24: 10:00:00 1000 mg via INTRAVENOUS
  Filled 2019-04-24: qty 50

## 2019-04-24 MED ORDER — METOPROLOL TARTRATE 100 MG PO TABS
100.0000 mg | ORAL_TABLET | Freq: Two times a day (BID) | ORAL | Status: DC
  Administered 2019-04-24 – 2019-04-26 (×5): 100 mg via ORAL
  Filled 2019-04-24 (×5): qty 1

## 2019-04-24 MED ORDER — SODIUM CHLORIDE 0.9% FLUSH
10.0000 mL | Freq: Two times a day (BID) | INTRAVENOUS | Status: DC
  Administered 2019-04-24 – 2019-04-26 (×3): 10 mL

## 2019-04-24 MED ORDER — ENOXAPARIN SODIUM 30 MG/0.3ML ~~LOC~~ SOLN
30.0000 mg | SUBCUTANEOUS | Status: DC
  Administered 2019-04-24 – 2019-04-25 (×2): 30 mg via SUBCUTANEOUS
  Filled 2019-04-24 (×2): qty 0.3

## 2019-04-24 MED ORDER — HYDRALAZINE HCL 20 MG/ML IJ SOLN: 10.0000 mg | Freq: Four times a day (QID) | INTRAMUSCULAR | Status: DC | PRN

## 2019-04-24 MED ORDER — MUPIROCIN 2 % EX OINT
1.0000 "application " | TOPICAL_OINTMENT | Freq: Two times a day (BID) | CUTANEOUS | Status: DC
  Administered 2019-04-25 – 2019-04-26 (×3): 1 via NASAL
  Filled 2019-04-24: qty 22

## 2019-04-24 MED ORDER — OXYBUTYNIN CHLORIDE ER 15 MG PO TB24
15.0000 mg | ORAL_TABLET | Freq: Every evening | ORAL | Status: DC
  Administered 2019-04-24 – 2019-04-26 (×3): 15 mg via ORAL
  Filled 2019-04-24 (×3): qty 1

## 2019-04-24 MED ORDER — DOCUSATE SODIUM 100 MG PO CAPS
100.0000 mg | ORAL_CAPSULE | Freq: Two times a day (BID) | ORAL | Status: DC
  Administered 2019-04-24 – 2019-04-26 (×5): 100 mg via ORAL
  Filled 2019-04-24 (×5): qty 1

## 2019-04-24 MED ORDER — ACETAMINOPHEN 650 MG RE SUPP: 650.0000 mg | Freq: Four times a day (QID) | RECTAL | Status: DC | PRN

## 2019-04-24 MED ORDER — INSULIN ASPART 100 UNIT/ML ~~LOC~~ SOLN
10.0000 [IU] | Freq: Three times a day (TID) | SUBCUTANEOUS | Status: DC
  Administered 2019-04-24: 18:00:00 10 [IU] via SUBCUTANEOUS

## 2019-04-24 MED ORDER — INSULIN ASPART 100 UNIT/ML ~~LOC~~ SOLN
0.0000 [IU] | Freq: Every day | SUBCUTANEOUS | Status: DC
  Administered 2019-04-24: 22:00:00 5 [IU] via SUBCUTANEOUS
  Administered 2019-04-25 – 2019-04-26 (×2): 3 [IU] via SUBCUTANEOUS

## 2019-04-24 MED ORDER — SENNA 8.6 MG PO TABS
1.0000 | ORAL_TABLET | Freq: Every day | ORAL | Status: DC
  Administered 2019-04-24 – 2019-04-26 (×3): 8.6 mg via ORAL
  Filled 2019-04-24 (×3): qty 1

## 2019-04-24 MED ORDER — PANTOPRAZOLE SODIUM 40 MG PO TBEC
40.0000 mg | DELAYED_RELEASE_TABLET | Freq: Two times a day (BID) | ORAL | Status: DC
  Administered 2019-04-24 – 2019-04-26 (×6): 40 mg via ORAL
  Filled 2019-04-24 (×7): qty 1

## 2019-04-24 MED ORDER — CYCLOSPORINE 0.05 % OP EMUL
1.0000 [drp] | Freq: Two times a day (BID) | OPHTHALMIC | Status: DC
  Administered 2019-04-24 – 2019-04-26 (×5): 1 [drp] via OPHTHALMIC
  Filled 2019-04-24 (×6): qty 30

## 2019-04-24 MED ORDER — SODIUM CHLORIDE 0.9% FLUSH: 10.0000 mL | INTRAVENOUS | Status: DC | PRN

## 2019-04-24 MED ORDER — SODIUM POLYSTYRENE SULFONATE 15 GM/60ML PO SUSP
30.0000 g | Freq: Once | ORAL | Status: AC
  Administered 2019-04-24: 10:00:00 30 g via ORAL
  Filled 2019-04-24: qty 120

## 2019-04-24 MED ORDER — TRIAMCINOLONE ACETONIDE 0.1 % EX CREA
TOPICAL_CREAM | Freq: Two times a day (BID) | CUTANEOUS | Status: DC
  Administered 2019-04-24 – 2019-04-25 (×2): via TOPICAL
  Administered 2019-04-25: 1 via TOPICAL
  Administered 2019-04-26: 09:00:00 via TOPICAL
  Filled 2019-04-24: qty 15

## 2019-04-24 MED ORDER — ACETAMINOPHEN 325 MG PO TABS
650.0000 mg | ORAL_TABLET | Freq: Four times a day (QID) | ORAL | Status: DC | PRN
  Administered 2019-04-24: 650 mg via ORAL
  Filled 2019-04-24: qty 2

## 2019-04-24 MED ORDER — SODIUM CHLORIDE 0.9 % IV SOLN
INTRAVENOUS | Status: DC
  Administered 2019-04-24 – 2019-04-25 (×3): via INTRAVENOUS

## 2019-04-24 NOTE — Progress Notes (Signed)
ASSUMPTION OF CARE NOTE   04/24/2019 9:52 AM  Christy May was seen and examined.  The H&P by the admitting provider, orders, imaging was reviewed.  Please see new orders.  Will continue to follow. MIDLINE IV BEING PLACED DUE TO POOR IV ACCESS AND SMALL VEINS.  MENTATION IS IMPROVING AND WAS ABLE TO HAVE CONVERSATION WITH PATIENT AND EXPLAINED TREATMENT PLAN TO HER AND SHE VERBALIZED UNDERSTANDING.   Vitals:   04/24/19 0645 04/24/19 0905  BP: (!) 145/76 139/85  Pulse: 92 93  Resp: 10 12  SpO2: 100% 100%    Results for orders placed or performed during the hospital encounter of 04/23/19  CBC with Differential/Platelet  Result Value Ref Range   WBC 8.7 4.0 - 10.5 K/uL   RBC 4.18 3.87 - 5.11 MIL/uL   Hemoglobin 12.2 12.0 - 15.0 g/dL   HCT 40.2 36.0 - 46.0 %   MCV 96.2 80.0 - 100.0 fL   MCH 29.2 26.0 - 34.0 pg   MCHC 30.3 30.0 - 36.0 g/dL   RDW 14.7 11.5 - 15.5 %   Platelets 190 150 - 400 K/uL   nRBC 0.0 0.0 - 0.2 %   Neutrophils Relative % 78 %   Neutro Abs 6.8 1.7 - 7.7 K/uL   Lymphocytes Relative 15 %   Lymphs Abs 1.3 0.7 - 4.0 K/uL   Monocytes Relative 6 %   Monocytes Absolute 0.5 0.1 - 1.0 K/uL   Eosinophils Relative 0 %   Eosinophils Absolute 0.0 0.0 - 0.5 K/uL   Basophils Relative 0 %   Basophils Absolute 0.0 0.0 - 0.1 K/uL   Immature Granulocytes 1 %   Abs Immature Granulocytes 0.04 0.00 - 0.07 K/uL  Comprehensive metabolic panel  Result Value Ref Range   Sodium 135 135 - 145 mmol/L   Potassium 5.6 (H) 3.5 - 5.1 mmol/L   Chloride 96 (L) 98 - 111 mmol/L   CO2 28 22 - 32 mmol/L   Glucose, Bld 266 (H) 70 - 99 mg/dL   BUN 52 (H) 8 - 23 mg/dL   Creatinine, Ser 2.32 (H) 0.44 - 1.00 mg/dL   Calcium 9.7 8.9 - 10.3 mg/dL   Total Protein 6.6 6.5 - 8.1 g/dL   Albumin 2.9 (L) 3.5 - 5.0 g/dL   AST 10 (L) 15 - 41 U/L   ALT 22 0 - 44 U/L   Alkaline Phosphatase 67 38 - 126 U/L   Total Bilirubin 0.8 0.3 - 1.2 mg/dL   GFR calc non Af Amer 21 (L) >60 mL/min   GFR calc  Af Amer 24 (L) >60 mL/min   Anion gap 11 5 - 15  Urinalysis, Routine w reflex microscopic  Result Value Ref Range   Color, Urine YELLOW YELLOW   APPearance CLEAR CLEAR   Specific Gravity, Urine 1.014 1.005 - 1.030   pH 5.0 5.0 - 8.0   Glucose, UA 50 (A) NEGATIVE mg/dL   Hgb urine dipstick NEGATIVE NEGATIVE   Bilirubin Urine NEGATIVE NEGATIVE   Ketones, ur NEGATIVE NEGATIVE mg/dL   Protein, ur NEGATIVE NEGATIVE mg/dL   Nitrite NEGATIVE NEGATIVE   Leukocytes,Ua NEGATIVE NEGATIVE  Ammonia  Result Value Ref Range   Ammonia 31 9 - 35 umol/L  CBG monitoring, ED  Result Value Ref Range   Glucose-Capillary 266 (H) 70 - 99 mg/dL  I-STAT 7, (LYTES, BLD GAS, ICA, H+H)  Result Value Ref Range   pH, Arterial 7.358 7.350 - 7.450   pCO2 arterial 56.7 (H)  32.0 - 48.0 mmHg   pO2, Arterial 120.0 (H) 83.0 - 108.0 mmHg   Bicarbonate 32.0 (H) 20.0 - 28.0 mmol/L   TCO2 34 (H) 22 - 32 mmol/L   O2 Saturation 98.0 %   Acid-Base Excess 5.0 (H) 0.0 - 2.0 mmol/L   Sodium 133 (L) 135 - 145 mmol/L   Potassium 5.4 (H) 3.5 - 5.1 mmol/L   Calcium, Ion 1.32 1.15 - 1.40 mmol/L   HCT 37.0 36.0 - 46.0 %   Hemoglobin 12.6 12.0 - 15.0 g/dL   Patient temperature 98.1 F    Collection site RADIAL, ALLEN'S TEST ACCEPTABLE    Drawn by RT    Sample type ARTERIAL   CBG monitoring, ED  Result Value Ref Range   Glucose-Capillary 206 (H) 70 - 99 mg/dL   *Note: Due to a large number of results and/or encounters for the requested time period, some results have not been displayed. A complete set of results can be found in Results Review.   Murvin Natal, MD [facility]   04/23/2019 10:23 PM How to contact the [facility] Attending or Consulting provider [place] or covering provider during after hours [place], for this patient?  1. Check the care team in [facility] and look for a) attending/consulting [facility] provider listed and b) the [facility] team listed 2. Log into [email or web address] and use [place]'s universal password to access. If you  do not have the password, please contact the hospital operator. 3. Locate the [facility] provider you are looking for under [facility] and page to a number that you can be directly reached. 4. If you still have difficulty reaching the provider, please page the [facility] (Director on Call) for the Hospitalists listed on amion for assistance.

## 2019-04-24 NOTE — ED Notes (Signed)
IV team at bedside 

## 2019-04-24 NOTE — ED Notes (Signed)
Patient cleaned up and placed on purewick for urine collection

## 2019-04-24 NOTE — ED Notes (Signed)
Breakfast Ordered 

## 2019-04-24 NOTE — H&P (Signed)
TRH H&P    Patient Demographics:    Suleika Quero, is a 68 y.o. female  MRN: [id number]  DOB - October 13, 1950  Admit Date - 04/23/2019  Referring MD/NP/PA:  Scot Jun  Outpatient Primary MD for the patient is Lucianne Lei, MD  Patient coming from:  [facility]  Chief complaint- altered mental status,    HPI:    Tayllor Kindle  is a 68 y.o. female, w hypertension, Dm2, neuropathy, anxiety/depression, gerd, dysphgia, chronic back pain, presents with altered mental status, increase in somnolence today at SNF.  Pt denies any headache, vision change, focal weakness, numbness, tingling.   In ED,  T afebrile  P 88, R 10, Bp 133/77 pox 98% on RA  CT brain  IMPRESSION: 1. No evidence of acute intracranial or cervical spine injury. 2. Mild atrophy and chronic small vessel ischemia.  CXR IMPRESSION: 1. Low lung volumes with bibasilar atelectasis. In infiltrate is difficult to entirely exclude. 2. Cardiomegaly.  Vascular congestion is noted.  Wbc 8.7, Hgb 12.2, Plt 190 Na 135, K 5.6, Bun 52, Creat 2.32 Ast 10, Alt 22 Alb 2.9  Ammonia 31 Urinalysis negative  Pt given ns 53mL iv x1   Pt will be admitted for ARF secondary to dye , and AMS likely secondary to decrease in clearance of gabapentin as well as norco.    Review of systems:    In addition to the HPI above,  No Fever-chills, No Headache, No changes with Vision or hearing, No problems swallowing food or Liquids, No Chest pain, Cough or Shortness of Breath, No Abdominal pain, No Nausea or Vomiting, bowel movements are regular, No Blood in stool or Urine, No dysuria, No new skin rashes or bruises, No new joints pains-aches,  No new weakness, tingling, numbness in any extremity, No recent weight gain or loss, No polyuria, polydypsia or polyphagia, No significant Mental Stressors.  All other systems reviewed and are negative.    Past History of the following :    Past Medical History:  Diagnosis Date  . Allergy   . Anemia, unspecified   . Anxiety   . Arthritis    "neck, back, hands" (09/03/2014)  . Chronic airway obstruction, not elsewhere classified    "I was told I don't have this/tests done @ [facility] 05/2014"  . Chronic back pain   . Colon polyps   . Complication of anesthesia    "I've had recall; I probably stopped breathing at least 2 times" (09/03/2014)  . Dehydration   . Depressive disorder, not elsewhere classified   . Diabetic peripheral neuropathy ([place])   . Dysphagia 2007   historyof dysphagia with severe dysmotility by barium swallow-[name]  . Esophageal reflux   . Family history of adverse reaction to anesthesia    "my brother was told they lost him a couple times during OR"  . HCAP (healthcare-associated pneumonia) 03/25/2018  . Heart murmur   . History of hiatal hernia   . Migraine    "couple times/month right now" (09/03/2014)  . Obesity, unspecified   .  OSA (obstructive sleep apnea)     CPAP  . Septic shock ([place]) 08/30/2017  . Spondylosis of unspecified site without mention of myelopathy   . Type II diabetes mellitus ([place])   . Unspecified essential hypertension   . Unspecified menopausal and postmenopausal disorder   . Unspecified vitamin D deficiency   . Upper airway cough syndrome       Past Surgical History:  Procedure Laterality Date  . ABDOMINAL HYSTERECTOMY     "partial"  . ANTERIOR CERVICAL DECOMP/DISCECTOMY FUSION     multiple cervical spine levels;Dr. Arnoldo Morale  . APPENDECTOMY    . BACK SURGERY    . CHOLECYSTECTOMY OPEN    . COLONOSCOPY N/A 05/08/2014   Procedure: COLONOSCOPY;  Surgeon: [name], MD;  Location: [facility];  Service: Endoscopy;  Laterality: N/A;  . DILATION AND CURETTAGE OF UTERUS    . ESOPHAGOGASTRODUODENOSCOPY N/A 05/08/2014   Procedure: ESOPHAGOGASTRODUODENOSCOPY (EGD);  Surgeon: [name], MD;  Location: [name];  Service:  Endoscopy;  Laterality: N/A;  . EXPLORATORY LAPAROTOMY    . EYE SURGERY    . FOOT SURGERY Right X 2   "took blood out of my arms and put platelets in my feet"  . INCISION AND DRAINAGE ABSCESS     Chest  . JOINT REPLACEMENT    . REFRACTIVE SURGERY Bilateral   . THORACIC DISCECTOMY N/A 02/17/2018   Procedure: THORACIC ONE - THORACIC TWO LAMINECTOMY;  Surgeon: Newman Pies, MD;  Location: [place];  Service: Neurosurgery;  Laterality: N/A;  THORACIC ONE - THORACIC TWO LAMINECTOMY  . TONSILLECTOMY    . TOTAL KNEE ARTHROPLASTY Left 10/2003  . TOTAL KNEE ARTHROPLASTY Right 01/2004   [name]      Social History:      Social History   Tobacco Use  . Smoking status: Former Smoker    Packs/day: 1.00    Years: 20.00    Pack years: 20.00    Types: Cigarettes    Quit date: 06/22/1986    Years since quitting: 32.8  . Smokeless tobacco: Never Used  Substance Use Topics  . Alcohol use: Yes       Family History :     Family History  Problem Relation Age of Onset  . Esophageal cancer Brother   . Esophageal cancer Sister        ?  . Colon polyps Brother   . Pancreatic cancer Sister        ?  . Diabetes Sister   . Diabetes Other        Aunt and Uncle  . Heart disease Maternal Grandfather        Home Medications:   Prior to Admission medications   Medication Sig Start Date End Date Taking? Authorizing Provider  allopurinol (ZYLOPRIM) 300 MG tablet Take 300 mg by mouth daily.    Yes [provider]  benzonatate (TESSALON) 100 MG capsule Take 100 mg by mouth at bedtime.    Yes [provider]  Cholecalciferol (VITAMIN D-3) 1000 units CAPS Take 2,000 Units by mouth daily.    Yes [provider]  cyclobenzaprine (FLEXERIL) 10 MG tablet Take 1 tablet (10 mg total) by mouth 3 (three) times daily as needed for muscle spasms. 02/22/18  Yes Regalado, Belkys A, MD  cycloSPORINE (RESTASIS) 0.05 % ophthalmic emulsion Place 1 drop into both eyes 2 (two) times  daily.    Yes [provider]  docusate sodium (COLACE) 100 MG capsule Take 1 capsule (100  mg total) by mouth 2 (two) times daily. 02/19/18  Yes Regalado, Belkys A, MD  Dulaglutide (TRULICITY) 1.5 0000000 SOPN Inject 1.5 mg into the skin every Friday.   Yes [provider]  fluticasone (FLONASE) 50 MCG/ACT nasal spray SHAKE LIQUID AND USE 2 SPRAYS IN EACH NOSTRIL DAILY Patient taking differently: Place 2 sprays into both nostrils daily.  03/08/17  Yes Collene Gobble, MD  gabapentin (NEURONTIN) 300 MG capsule Take 900 mg by mouth 3 (three) times daily.  11/14/18  Yes [provider]  guaiFENesin (MUCINEX) 600 MG 12 hr tablet Take 600 mg by mouth 2 (two) times daily.   Yes [provider]  hydrochlorothiazide (MICROZIDE) 12.5 MG capsule Take 12.5 mg by mouth daily.    Yes [provider]  insulin aspart (NOVOLOG) 100 UNIT/ML injection Inject 12 Units into the skin 3 (three) times daily with meals.    Yes [provider]  insulin detemir (LEVEMIR) 100 UNIT/ML injection Inject 40 Units into the skin 2 (two) times daily.   Yes [provider]  irbesartan (AVAPRO) 300 MG tablet Take 300 mg by mouth daily.    Yes [provider]  Lidocaine (ASPERCREME LIDOCAINE) 4 % PTCH Place 1 patch onto the skin 2 (two) times daily.   Yes [provider]  loratadine (CLARITIN) 10 MG tablet Take 10 mg by mouth daily.    Yes [provider]  metoprolol tartrate (LOPRESSOR) 100 MG tablet Take 100 mg by mouth 2 (two) times daily.    Yes [provider]  montelukast (SINGULAIR) 10 MG tablet TAKE 1 TABLET(10 MG) BY MOUTH DAILY Patient taking differently: Take 10 mg by mouth daily.  04/03/16  Yes Magdalen Spatz, NP  Multiple Vitamins-Minerals (MULTIVITAMIN WITH MINERALS) tablet Take 1 tablet by mouth at bedtime.    Yes [provider]  oxybutynin (DITROPAN XL) 15 MG 24 hr tablet Take 15 mg by mouth every evening.  12/02/18   Yes [provider]  pantoprazole (PROTONIX) 40 MG tablet TAKE 1 TABLET(40 MG) BY MOUTH TWICE DAILY Patient taking differently: Take 40 mg by mouth 2 (two) times daily.  03/02/17  Yes Collene Gobble, MD  polycarbophil (FIBERCON) 625 MG tablet Take 625 mg by mouth 2 (two) times daily.    Yes [provider]  polyethylene glycol (MIRALAX / GLYCOLAX) packet Take 17 g by mouth 2 (two) times daily. Patient taking differently: Take 17 g by mouth daily. Min with 4-8 oz beverage and drink 02/22/18  Yes Regalado, Belkys A, MD  predniSONE (DELTASONE) 20 MG tablet Take 20 mg by mouth daily with breakfast.    Yes [provider]  senna (SENOKOT) 8.6 MG TABS tablet Take 1 tablet (8.6 mg total) by mouth daily. Patient taking differently: Take 1 tablet by mouth at bedtime.  02/23/18  Yes Regalado, Belkys A, MD  sodium chloride (OCEAN) 0.65 % SOLN nasal spray Place 2 sprays into both nostrils 2 (two) times daily.   Yes [provider]  triamcinolone cream (KENALOG) 0.1 % Apply 1 application topically See admin instructions. Apply 2 grams topically to bilateral forearms twice daily   Yes [provider]  HYDROcodone-acetaminophen (NORCO/VICODIN) 5-325 MG tablet Take 2 tablets by mouth every 6 (six) hours as needed (pain). Patient not taking: Reported on 04/24/2019 03/28/18   Caren Griffins, MD  Insulin Syringe-Needle U-100 (INSULIN SYRINGE 1CC/31GX5/16") 31G X 5/16" 1 ML MISC USE TO INJECT INSULIN TWICE DAILY 05/11/17   Renato Shin,  MD  meclizine (ANTIVERT) 25 MG tablet Take 1 tablet (25 mg total) by mouth 3 (three) times daily as needed for dizziness. Patient not taking: Reported on 04/21/2019 02/06/18   Dorie Rank, MD  ONE [facility] LANCETS MISC Use to check sugars twice daily DX E11.22 08/23/17   Renato Shin, MD  OXYGEN Inhale 2 L/min into the lungs.    [provider]  PRESCRIPTION MEDICATION Inhale into the lungs at bedtime. CPAP    [provider]      Allergies:     Allergies  Allergen Reactions  . Ace Inhibitors Anaphylaxis and Cough    Tolerates Irbesartan (home med)  . Penicillins Hives and Other (See Comments)    Tolerated ceftriaxone in 2019  Has patient had a PCN reaction causing immediate rash, facial/tongue/throat swelling, SOB or lightheadedness with hypotension: Yes Has patient had a PCN reaction causing severe rash involving mucus membranes or skin necrosis:  No Has patient had a PCN reaction that required hospitalization: No Has patient had a PCN reaction occurring within the last 10 years: No If all of the above answers are "NO", then may proceed with Cephalosporin use.  . Adhesive [Tape] Hives and Rash     Physical Exam:   Vitals  Blood pressure 117/74, pulse 86, resp. rate (!) 9, SpO2 100 %.  1.  General: axoxo3 (person, hospital, year)  2. Psychiatric: euthymic  3. Neurologic: cn2-12 intact, reflexes 2+ symmetric, direct, consensual, near intact  4. HEENMT:  Anicteric, pupils 1.54mm symmetric, direct, consensual, near intact Neck: no jvd  5. Respiratory : CTAB  6. Cardiovascular : rrr s1, s2, 1/6 sem apex  7. Gastrointestinal:  Abd: soft, nt, nd, +bs Morbidly obese  8. Skin:  Ext: no c/c/e,  No rash  9.Musculoskeletal:  Good rom    Data Review:    CBC Recent Labs  Lab 04/21/19 1403 04/23/19 2355 04/24/19 0052  WBC 9.6  --  8.7  HGB 12.4 12.6 12.2  HCT 41.3 37.0 40.2  PLT 202  --  190  MCV 96.3  --  96.2  MCH 28.9  --  29.2  MCHC 30.0  --  30.3  RDW 14.9  --  14.7  LYMPHSABS 3.3  --  1.3  MONOABS 0.6  --  0.5  EOSABS 0.1  --  0.0  BASOSABS 0.0  --  0.0   ------------------------------------------------------------------------------------------------------------------  Results for orders placed or performed during the hospital encounter of 04/23/19 (from the past 48 hour(s))  CBG monitoring, ED     Status: Abnormal   Collection Time: 04/23/19 10:35 PM  Result Value Ref  Range   Glucose-Capillary 266 (H) 70 - 99 mg/dL  I-STAT 7, (LYTES, BLD GAS, ICA, H+H)     Status: Abnormal   Collection Time: 04/23/19 11:55 PM  Result Value Ref Range   pH, Arterial 7.358 7.350 - 7.450   pCO2 arterial 56.7 (H) 32.0 - 48.0 mmHg   pO2, Arterial 120.0 (H) 83.0 - 108.0 mmHg   Bicarbonate 32.0 (H) 20.0 - 28.0 mmol/L   TCO2 34 (H) 22 - 32 mmol/L   O2 Saturation 98.0 %   Acid-Base Excess 5.0 (H) 0.0 - 2.0 mmol/L   Sodium 133 (L) 135 - 145 mmol/L   Potassium 5.4 (H) 3.5 - 5.1 mmol/L   Calcium, Ion 1.32 1.15 - 1.40 mmol/L   HCT 37.0 36.0 - 46.0 %   Hemoglobin 12.6 12.0 - 15.0 g/dL   Patient temperature 98.1 F  Collection site RADIAL, ALLEN'S TEST ACCEPTABLE    Drawn by RT    Sample type ARTERIAL   CBC with Differential/Platelet     Status: None   Collection Time: 04/24/19 12:52 AM  Result Value Ref Range   WBC 8.7 4.0 - 10.5 K/uL   RBC 4.18 3.87 - 5.11 MIL/uL   Hemoglobin 12.2 12.0 - 15.0 g/dL   HCT 40.2 36.0 - 46.0 %   MCV 96.2 80.0 - 100.0 fL   MCH 29.2 26.0 - 34.0 pg   MCHC 30.3 30.0 - 36.0 g/dL   RDW 14.7 11.5 - 15.5 %   Platelets 190 150 - 400 K/uL   nRBC 0.0 0.0 - 0.2 %   Neutrophils Relative % 78 %   Neutro Abs 6.8 1.7 - 7.7 K/uL   Lymphocytes Relative 15 %   Lymphs Abs 1.3 0.7 - 4.0 K/uL   Monocytes Relative 6 %   Monocytes Absolute 0.5 0.1 - 1.0 K/uL   Eosinophils Relative 0 %   Eosinophils Absolute 0.0 0.0 - 0.5 K/uL   Basophils Relative 0 %   Basophils Absolute 0.0 0.0 - 0.1 K/uL   Immature Granulocytes 1 %   Abs Immature Granulocytes 0.04 0.00 - 0.07 K/uL    Comment: Performed at [facility], [address], [place], [place] [address]  Comprehensive metabolic panel     Status: Abnormal   Collection Time: 04/24/19 12:52 AM  Result Value Ref Range   Sodium 135 135 - 145 mmol/L   Potassium 5.6 (H) 3.5 - 5.1 mmol/L   Chloride 96 (L) 98 - 111 mmol/L   CO2 28 22 - 32 mmol/L   Glucose, Bld 266 (H) 70 - 99 mg/dL   BUN 52 (H) 8 - 23 mg/dL    Creatinine, Ser 2.32 (H) 0.44 - 1.00 mg/dL   Calcium 9.7 8.9 - 10.3 mg/dL   Total Protein 6.6 6.5 - 8.1 g/dL   Albumin 2.9 (L) 3.5 - 5.0 g/dL   AST 10 (L) 15 - 41 U/L   ALT 22 0 - 44 U/L   Alkaline Phosphatase 67 38 - 126 U/L   Total Bilirubin 0.8 0.3 - 1.2 mg/dL   GFR calc non Af Amer 21 (L) >60 mL/min   GFR calc Af Amer 24 (L) >60 mL/min   Anion gap 11 5 - 15    Comment: Performed at [facility], [place] [address] [place], [place] [address]  Ammonia     Status: None   Collection Time: 04/24/19  2:37 AM  Result Value Ref Range   Ammonia 31 9 - 35 umol/L    Comment: Performed at [facility], [place] [address], [place], [place] [address]  Urinalysis, Routine w reflex microscopic     Status: Abnormal   Collection Time: 04/24/19  3:10 AM  Result Value Ref Range   Color, Urine YELLOW YELLOW   APPearance CLEAR CLEAR   Specific Gravity, Urine 1.014 1.005 - 1.030   pH 5.0 5.0 - 8.0   Glucose, UA 50 (A) NEGATIVE mg/dL   Hgb urine dipstick NEGATIVE NEGATIVE   Bilirubin Urine NEGATIVE NEGATIVE   Ketones, ur NEGATIVE NEGATIVE mg/dL   Protein, ur NEGATIVE NEGATIVE mg/dL   Nitrite NEGATIVE NEGATIVE   Leukocytes,Ua NEGATIVE NEGATIVE    Comment: Performed at [place] [address], [place], [place] [address]   *Note: Due to a large number of results and/or encounters for the requested time period,  some results have not been displayed. A complete set of results can be found in Results Review.    Chemistries  Recent Labs  Lab 04/21/19 1403 04/23/19 2355 04/24/19 0052  NA 138 133* 135  K 4.5 5.4* 5.6*  CL 97*  --  96*  CO2 26  --  28  GLUCOSE 209*  --  266*  BUN 35*  --  52*  CREATININE 1.18*  --  2.32*  CALCIUM 9.5  --  9.7  AST 17  --  10*  ALT 22  --  22  ALKPHOS 52  --  67  BILITOT 0.2*  --  0.8   ------------------------------------------------------------------------------------------------------------------   ------------------------------------------------------------------------------------------------------------------ GFR: Estimated Creatinine Clearance: 30.7 mL/min (A) (by C-G formula based on SCr of 2.32 mg/dL (H)). Liver Function Tests: Recent Labs  Lab 04/21/19 1403 04/24/19 0052  AST 17 10*  ALT 22 22  ALKPHOS 52 67  BILITOT 0.2* 0.8  PROT 6.6 6.6  ALBUMIN 3.1* 2.9*   Recent Labs  Lab 04/21/19 1403  LIPASE 32   Recent Labs  Lab 04/24/19 0237  AMMONIA 31   Coagulation Profile: No results for input(s): INR, PROTIME in the last 168 hours. Cardiac Enzymes: No results for input(s): CKTOTAL, CKMB, CKMBINDEX, TROPONINI in the last 168 hours. BNP (last 3 results) No results for input(s): PROBNP in the last 8760 hours. HbA1C: No results for input(s): HGBA1C in the last 72 hours. CBG: Recent Labs  Lab 04/23/19 2235  GLUCAP 266*   Lipid Profile: No results for input(s): CHOL, HDL, LDLCALC, TRIG, CHOLHDL, LDLDIRECT in the last 72 hours. Thyroid Function Tests: No results for input(s): TSH, T4TOTAL, FREET4, T3FREE, THYROIDAB in the last 72 hours. Anemia Panel: No results for input(s): VITAMINB12, FOLATE, FERRITIN, TIBC, IRON, RETICCTPCT in the last 72 hours.  --------------------------------------------------------------------------------------------------------------- Urine analysis:    Component Value Date/Time   COLORURINE YELLOW 04/24/2019 0310   APPEARANCEUR CLEAR 04/24/2019 0310   LABSPEC 1.014 04/24/2019 0310   PHURINE 5.0 04/24/2019 0310   GLUCOSEU 50 (A) 04/24/2019 0310   GLUCOSEU NEGATIVE 12/14/2011 1707   HGBUR NEGATIVE 04/24/2019 0310   BILIRUBINUR NEGATIVE 04/24/2019 0310   BILIRUBINUR n 11/16/2014 1515   KETONESUR NEGATIVE 04/24/2019 0310   PROTEINUR NEGATIVE 04/24/2019 0310   UROBILINOGEN 0.2 02/27/2015 2226   NITRITE NEGATIVE 04/24/2019 0310   LEUKOCYTESUR NEGATIVE 04/24/2019 0310      Imaging Results:    Dg Chest Port 1 View  Result  Date: 04/24/2019 CLINICAL DATA:  Weakness EXAM: PORTABLE CHEST 1 VIEW COMPARISON:  04/21/2019 FINDINGS: The heart size is enlarged. The lung volumes are low. There is bibasilar atelectasis. No pneumothorax. There is some vascular congestion without overt pulmonary edema. Patient is status post prior ACDF of the lower cervical spine. IMPRESSION: 1. Low lung volumes with bibasilar atelectasis. In infiltrate is difficult to entirely exclude. 2. Cardiomegaly.  Vascular congestion is noted. Electronically Signed   By: Constance Holster M.D.   On: 04/24/2019 00:51      Assessment & Plan:    Principal Problem:   ARF (acute renal failure) (HCC) Active Problems:   Essential hypertension   Diabetes ([place])   AMS (altered mental status)  ARF  Hold Irbesartan Hold Hydrochlorothiazide Check renal ultrasound Hydrate with ns iv Check cmp in am  Hyperkalemia Sodium bicarbonate 1 amp iv x1 Calcium gluconate 1gm iv x1 Kayexalate 30gm po x1 Check bmp 9 am today  AMS secondary to ARF, w decrease in clearance of gabapentin/ norco Hold above  Monitor  Hypertension Hold Irbesartan as above Hold hydrochlorothiazide Cont Metoprolol 100mg  po bid   Dm2 Cont Levemir Hold Trulicity  fsbs ac and qhs, ISS  Diabetic neuropathy Hold Gabapentin as above  Gerd Cont PPI  Allerghic rhinitis Cont Flonase Cont Singulair  Anemia Check cbc in am  Gout Cont Allopurinol 300mg -> 100mg  po qday until ARF resolved    DVT Prophylaxis-   Lovenox - SCDs  AM Labs Ordered, also please review Full Orders  Family Communication: Admission, patients condition and plan of care including tests being ordered have been discussed with the patient  who indicate understanding and agree with the plan and Code Status.  Code Status:  FULL CODE per patient,    Admission status: Observation: Based on patients clinical presentation and evaluation of above clinical data, I have made determination that patient meets  observation criteria at this time.    Time spent in minutes : 70    Jani Gravel M.D on 04/24/2019 at 4:31 AM

## 2019-04-24 NOTE — ED Notes (Signed)
Found 30g sodium polystrene sulfonate and 1000 mg calcium gluconate at bedside. Medications documented as given.

## 2019-04-24 NOTE — Progress Notes (Signed)
PT received to 3e21 from ED, pt tranferred to bed, noted brief was saturated, cleansed, barriier applied and purewick, pt says she uses wheelchair at home, vss, st on telemetry, pt c/o upper back pain, called pharmacy to get med verified, telemetry applied and CCMD called to verify, called to ensure meal tray was ordered, call light in reach

## 2019-04-24 NOTE — Progress Notes (Signed)
Consult request has been received. CSW attempting to follow up at present time  Ricarda Atayde M. Naod Sweetland LCSWA Transitions of Care  Clinical Social Worker  Ph: [phone] 

## 2019-04-24 NOTE — ED Notes (Signed)
Unable to give calcium gluconate, bicarb, and kayexalate d/t bicarb being out of stock in pyxis and pt only having IV in hand. IV team consult ordered

## 2019-04-24 NOTE — ED Notes (Signed)
ED TO INPATIENT HANDOFF REPORT  ED Nurse Name and Phone #: Annie Main 60  S Name/Age/Gender Christy May 68 y.o. female Room/Bed: 057C/057C  Code Status   Code Status: Full Code  Home/SNF/Other Home Patient oriented to: self, place, time and situation Is this baseline? Yes   Triage Complete: Triage complete  Chief Complaint Sleepy  Triage Note Pt to ED vis [facility] from facility for "sleepiness"   Allergies Allergies  Allergen Reactions  . Ace Inhibitors Anaphylaxis and Cough    Tolerates Irbesartan (home med)  . Penicillins Hives and Other (See Comments)    Tolerated ceftriaxone in 2019  Has patient had a PCN reaction causing immediate rash, facial/tongue/throat swelling, SOB or lightheadedness with hypotension: Yes Has patient had a PCN reaction causing severe rash involving mucus membranes or skin necrosis:  No Has patient had a PCN reaction that required hospitalization: No Has patient had a PCN reaction occurring within the last 10 years: No If all of the above answers are "NO", then may proceed with Cephalosporin use.  . Adhesive [Tape] Hives and Rash    Level of Care/Admitting Diagnosis ED Disposition    ED Disposition Condition Comment   Admit  Hospital Area: [facility] [[id number]]  Level of Care: Telemetry Medical [104]  I expect the patient will be discharged within 24 hours: No (not a candidate for 5C-Observation unit)  Covid Evaluation: Asymptomatic Screening Protocol (No Symptoms)  Diagnosis: ARF (acute renal failure) [facility]) [id number]  Admitting Physician: Jani Gravel [[id number]]  Attending Physician: Jani Gravel [[id number]]  PT Class (Do Not Modify): Observation [104]  PT Acc Code (Do Not Modify): Observation [10022]       B Medical/Surgery History Past Medical History:  Diagnosis Date  . Allergy   . Anemia, unspecified   . Anxiety   . Arthritis    "neck, back, hands" (09/03/2014)  . Chronic airway obstruction, not elsewhere  classified    "I was told I don't have this/tests done @ [facility] 05/2014"  . Chronic back pain   . Colon polyps   . Complication of anesthesia    "I've had recall; I probably stopped breathing at least 2 times" (09/03/2014)  . Dehydration   . Depressive disorder, not elsewhere classified   . Diabetic peripheral neuropathy ([place])   . Dysphagia 2007   historyof dysphagia with severe dysmotility by barium swallow-[name]  . Esophageal reflux   . Family history of adverse reaction to anesthesia    "my brother was told they lost him a couple times during OR"  . HCAP (healthcare-associated pneumonia) 03/25/2018  . Heart murmur   . History of hiatal hernia   . Migraine    "couple times/month right now" (09/03/2014)  . Obesity, unspecified   . OSA (obstructive sleep apnea)     CPAP  . Septic shock ([place]) 08/30/2017  . Spondylosis of unspecified site without mention of myelopathy   . Type II diabetes mellitus ([place])   . Unspecified essential hypertension   . Unspecified menopausal and postmenopausal disorder   . Unspecified vitamin D deficiency   . Upper airway cough syndrome    Past Surgical History:  Procedure Laterality Date  . ABDOMINAL HYSTERECTOMY     "partial"  . ANTERIOR CERVICAL DECOMP/DISCECTOMY FUSION     multiple cervical spine levels;Dr. Arnoldo Morale  . APPENDECTOMY    . BACK SURGERY    . CHOLECYSTECTOMY OPEN    . COLONOSCOPY N/A 05/08/2014   Procedure: COLONOSCOPY;  Surgeon: [name],  MD;  Location: [facility];  Service: Endoscopy;  Laterality: N/A;  . DILATION AND CURETTAGE OF UTERUS    . ESOPHAGOGASTRODUODENOSCOPY N/A 05/08/2014   Procedure: ESOPHAGOGASTRODUODENOSCOPY (EGD);  Surgeon: [name], MD;  Location: [name];  Service: Endoscopy;  Laterality: N/A;  . EXPLORATORY LAPAROTOMY    . EYE SURGERY    . FOOT SURGERY Right X 2   "took blood out of my arms and put platelets in my feet"  . INCISION AND DRAINAGE ABSCESS     Chest  . JOINT REPLACEMENT     . REFRACTIVE SURGERY Bilateral   . THORACIC DISCECTOMY N/A 02/17/2018   Procedure: THORACIC ONE - THORACIC TWO LAMINECTOMY;  Surgeon: Newman Pies, MD;  Location: [facility];  Service: Neurosurgery;  Laterality: N/A;  THORACIC ONE - THORACIC TWO LAMINECTOMY  . TONSILLECTOMY    . TOTAL KNEE ARTHROPLASTY Left 10/2003  . TOTAL KNEE ARTHROPLASTY Right 01/2004   Dr. Tressia Danas IV Location/Drains/Wounds Patient Lines/Drains/Airways Status   Active Line/Drains/Airways    Name:   Placement date:   Placement time:   Site:   Days:   Peripheral IV 04/24/19 Left;Posterior Hand   04/24/19    0109    Hand   less than 1   Midline Single Lumen 04/24/19 Midline Right Brachial 8 cm 0 cm   04/24/19    0945    Brachial   less than 1   External Urinary Catheter   03/26/18    2315    -   394   Incision (Closed) 02/17/18 Back Other (Comment)   02/17/18    1802     431   Pressure Injury 08/13/17 Stage II -  Partial thickness loss of dermis presenting as a shallow open ulcer with a red, pink wound bed without slough.   08/13/17    0146     619   Pressure Injury 08/12/17 Stage II -  Partial thickness loss of dermis presenting as a shallow open ulcer with a red, pink wound bed without slough.   08/12/17    0030     620          Intake/Output Last 24 hours  Intake/Output Summary (Last 24 hours) at 04/24/2019 1550 Last data filed at 04/24/2019 1014 Gross per 24 hour  Intake 1392.5 ml  Output -  Net 1392.5 ml    Labs/Imaging Results for orders placed or performed during the hospital encounter of 04/23/19 (from the past 48 hour(s))  CBG monitoring, ED     Status: Abnormal   Collection Time: 04/23/19 10:35 PM  Result Value Ref Range   Glucose-Capillary 266 (H) 70 - 99 mg/dL  I-STAT 7, (LYTES, BLD GAS, ICA, H+H)     Status: Abnormal   Collection Time: 04/23/19 11:55 PM  Result Value Ref Range   pH, Arterial 7.358 7.350 - 7.450   pCO2 arterial 56.7 (H) 32.0 - 48.0 mmHg   pO2, Arterial 120.0 (H) 83.0 - 108.0  mmHg   Bicarbonate 32.0 (H) 20.0 - 28.0 mmol/L   TCO2 34 (H) 22 - 32 mmol/L   O2 Saturation 98.0 %   Acid-Base Excess 5.0 (H) 0.0 - 2.0 mmol/L   Sodium 133 (L) 135 - 145 mmol/L   Potassium 5.4 (H) 3.5 - 5.1 mmol/L   Calcium, Ion 1.32 1.15 - 1.40 mmol/L   HCT 37.0 36.0 - 46.0 %   Hemoglobin 12.6 12.0 - 15.0 g/dL   Patient temperature 98.1 F  Collection site RADIAL, ALLEN'S TEST ACCEPTABLE    Drawn by RT    Sample type ARTERIAL   CBC with Differential/Platelet     Status: None   Collection Time: 04/24/19 12:52 AM  Result Value Ref Range   WBC 8.7 4.0 - 10.5 K/uL   RBC 4.18 3.87 - 5.11 MIL/uL   Hemoglobin 12.2 12.0 - 15.0 g/dL   HCT 40.2 36.0 - 46.0 %   MCV 96.2 80.0 - 100.0 fL   MCH 29.2 26.0 - 34.0 pg   MCHC 30.3 30.0 - 36.0 g/dL   RDW 14.7 11.5 - 15.5 %   Platelets 190 150 - 400 K/uL   nRBC 0.0 0.0 - 0.2 %   Neutrophils Relative % 78 %   Neutro Abs 6.8 1.7 - 7.7 K/uL   Lymphocytes Relative 15 %   Lymphs Abs 1.3 0.7 - 4.0 K/uL   Monocytes Relative 6 %   Monocytes Absolute 0.5 0.1 - 1.0 K/uL   Eosinophils Relative 0 %   Eosinophils Absolute 0.0 0.0 - 0.5 K/uL   Basophils Relative 0 %   Basophils Absolute 0.0 0.0 - 0.1 K/uL   Immature Granulocytes 1 %   Abs Immature Granulocytes 0.04 0.00 - 0.07 K/uL    Comment: Performed at [facility], [address], [place], [place] [address]  Comprehensive metabolic panel     Status: Abnormal   Collection Time: 04/24/19 12:52 AM  Result Value Ref Range   Sodium 135 135 - 145 mmol/L   Potassium 5.6 (H) 3.5 - 5.1 mmol/L   Chloride 96 (L) 98 - 111 mmol/L   CO2 28 22 - 32 mmol/L   Glucose, Bld 266 (H) 70 - 99 mg/dL   BUN 52 (H) 8 - 23 mg/dL   Creatinine, Ser 2.32 (H) 0.44 - 1.00 mg/dL   Calcium 9.7 8.9 - 10.3 mg/dL   Total Protein 6.6 6.5 - 8.1 g/dL   Albumin 2.9 (L) 3.5 - 5.0 g/dL   AST 10 (L) 15 - 41 U/L   ALT 22 0 - 44 U/L   Alkaline Phosphatase 67 38 - 126 U/L   Total Bilirubin 0.8 0.3 - 1.2 mg/dL   GFR calc non Af  Amer 21 (L) >60 mL/min   GFR calc Af Amer 24 (L) >60 mL/min   Anion gap 11 5 - 15    Comment: Performed at [facility], [place] [address] [place], [place] [address]  Ammonia     Status: None   Collection Time: 04/24/19  2:37 AM  Result Value Ref Range   Ammonia 31 9 - 35 umol/L    Comment: Performed at [facility], [place] [address], [place], [place] [address]  Urinalysis, Routine w reflex microscopic     Status: Abnormal   Collection Time: 04/24/19  3:10 AM  Result Value Ref Range   Color, Urine YELLOW YELLOW   APPearance CLEAR CLEAR   Specific Gravity, Urine 1.014 1.005 - 1.030   pH 5.0 5.0 - 8.0   Glucose, UA 50 (A) NEGATIVE mg/dL   Hgb urine dipstick NEGATIVE NEGATIVE   Bilirubin Urine NEGATIVE NEGATIVE   Ketones, ur NEGATIVE NEGATIVE mg/dL   Protein, ur NEGATIVE NEGATIVE mg/dL   Nitrite NEGATIVE NEGATIVE   Leukocytes,Ua NEGATIVE NEGATIVE    Comment: Performed at [facility] [address] [place], [place] [address]  SARS CORONAVIRUS 2 (TAT 6-24 HRS) Nasopharyngeal Nasopharyngeal Swab     Status: None  Collection Time: 04/24/19  3:45 AM   Specimen: Nasopharyngeal Swab  Result Value Ref Range   SARS Coronavirus 2 NEGATIVE NEGATIVE    Comment: (NOTE) SARS-CoV-2 target nucleic acids are NOT DETECTED. The SARS-CoV-2 RNA is generally detectable in upper and lower respiratory specimens during the acute phase of infection. Negative results do not preclude SARS-CoV-2 infection, do not rule out co-infections with other pathogens, and should not be used as the sole basis for treatment or other patient management decisions. Negative results must be combined with clinical observations, patient history, and epidemiological information. The expected result is Negative. Fact Sheet for Patients: SugarRoll.be Fact Sheet for Healthcare Providers: https://www.woods-mathews.com/ This test is not yet approved or cleared by the  [place] FDA and  has been authorized for detection and/or diagnosis of SARS-CoV-2 by FDA under an Emergency Use Authorization (EUA). This EUA will remain  in effect (meaning this test can be used) for the duration of the COVID-19 declaration under Section 56 4(b)(1) of the Act, 21 U.S.C. section 360bbb-3(b)(1), unless the authorization is terminated or revoked sooner. Performed at [facility], [place] [address] [place], [place] [address]   CBG monitoring, ED     Status: Abnormal   Collection Time: 04/24/19  8:27 AM  Result Value Ref Range   Glucose-Capillary 206 (H) 70 - 99 mg/dL  CBG monitoring, ED     Status: Abnormal   Collection Time: 04/24/19 11:53 AM  Result Value Ref Range   Glucose-Capillary 207 (H) 70 - 99 mg/dL   *Note: Due to a large number of results and/or encounters for the requested time period, some results have not been displayed. A complete set of results can be found in Results Review.   Ct Head Wo Contrast  Result Date: 04/24/2019 CLINICAL DATA:  Altered level of consciousness EXAM: CT HEAD WITHOUT CONTRAST TECHNIQUE: Contiguous axial images were obtained from the base of the skull through the vertex without intravenous contrast. COMPARISON:  01/27/2018 FINDINGS: Brain: No evidence of acute infarction, hemorrhage, hydrocephalus, extra-axial collection or mass lesion/mass effect. Mild cortical and cerebellar atrophy. Mild white matter disease compatible with chronic small vessel ischemia given medical history. Vascular: Atherosclerotic calcification Skull: Normal. Negative for fracture or focal lesion. Sinuses/Orbits: Bilateral cataract resection IMPRESSION: 1. No evidence of acute intracranial or cervical spine injury. 2. Mild atrophy and chronic small vessel ischemia. Electronically Signed   By: Monte Fantasia M.D.   On: 04/24/2019 04:31   US Renal  Result Date: 04/24/2019 CLINICAL DATA:  Acute renal failure EXAM: RENAL / URINARY TRACT ULTRASOUND COMPLETE  COMPARISON:  Abdominal CT from 3 days ago FINDINGS: Right Kidney: Renal measurements: 10.8 x 4.8 x 5.3 cm = volume: 143 mL . Echogenicity within normal limits. No mass or hydronephrosis visualized. Left Kidney: Renal measurements: 11.7 x 5 x 4.8 cm = volume: 149 mL. Echogenicity within normal limits. No solid mass or hydronephrosis visualized. 18 mm upper pole cortical cyst. Bladder: Appears normal for degree of bladder distention. Other: Sensitivity is degraded by body habitus. There are numerous small cysts by comparison abdominal CT that are not visible on this study. IMPRESSION: 1. No hydronephrosis or acute finding. 2. Small bilateral renal cysts. Electronically Signed   By: Monte Fantasia M.D.   On: 04/24/2019 05:01   Dg Chest Port 1 View  Result Date: 04/24/2019 CLINICAL DATA:  Weakness EXAM: PORTABLE CHEST 1 VIEW COMPARISON:  04/21/2019 FINDINGS: The heart size is enlarged. The lung volumes are low. There is bibasilar atelectasis.  No pneumothorax. There is some vascular congestion without overt pulmonary edema. Patient is status post prior ACDF of the lower cervical spine. IMPRESSION: 1. Low lung volumes with bibasilar atelectasis. In infiltrate is difficult to entirely exclude. 2. Cardiomegaly.  Vascular congestion is noted. Electronically Signed   By: Constance Holster M.D.   On: 04/24/2019 00:51    Pending Labs Unresulted Labs (From admission, onward)    Start     Ordered   05/01/19 0500  Creatinine, serum  (enoxaparin (LOVENOX)    CrCl < 30 ml/min)  Weekly,   R    Comments: while on enoxaparin therapy.    04/24/19 0350   04/25/19 0500  Comprehensive metabolic panel  Tomorrow morning,   R     04/24/19 0350   04/25/19 0500  CBC  Tomorrow morning,   R     04/24/19 0350   04/24/19 0956  HIV Antibody (routine testing w rflx)  (HIV Antibody (Routine testing w reflex) panel)  Add-on,   AD     04/24/19 0955          Vitals/Pain Today's Vitals   04/24/19 0630 04/24/19 0645 04/24/19  0905 04/24/19 1015  BP: 136/82 (!) 145/76 139/85   Pulse: 91 92 93   Resp: 10 10 12    SpO2: 100% 100% 100%   PainSc:    7     Isolation Precautions No active isolations  Medications Medications  enoxaparin (LOVENOX) injection 30 mg (has no administration in time range)  acetaminophen (TYLENOL) tablet 650 mg (has no administration in time range)    Or  acetaminophen (TYLENOL) suppository 650 mg (has no administration in time range)  feeding supplement (PRO-STAT SUGAR FREE 64) liquid 30 mL (30 mLs Oral Given 04/24/19 1221)  allopurinol (ZYLOPRIM) tablet 100 mg (has no administration in time range)  insulin detemir (LEVEMIR) injection 40 Units (has no administration in time range)  predniSONE (DELTASONE) tablet 20 mg (has no administration in time range)  pantoprazole (PROTONIX) EC tablet 40 mg (has no administration in time range)  cycloSPORINE (RESTASIS) 0.05 % ophthalmic emulsion 1 drop (has no administration in time range)  hydrALAZINE (APRESOLINE) injection 10 mg (has no administration in time range)  insulin aspart (novoLOG) injection 0-9 Units (3 Units Subcutaneous Given 04/24/19 1223)  insulin aspart (novoLOG) injection 0-5 Units (has no administration in time range)  0.9 %  sodium chloride infusion ( Intravenous New Bag/Given 04/24/19 1219)  sodium chloride flush (NS) 0.9 % injection 10-40 mL (10 mLs Intracatheter Given 04/24/19 1026)  sodium chloride flush (NS) 0.9 % injection 10-40 mL (has no administration in time range)  insulin aspart (novoLOG) injection 10 Units (has no administration in time range)  sodium chloride 0.9 % bolus 500 mL (0 mLs Intravenous Stopped 04/24/19 0506)  sodium polystyrene (KAYEXALATE) 15 GM/60ML suspension 30 g (30 g Oral Given 04/24/19 1017)  calcium gluconate 1 g/ 50 mL sodium chloride IVPB (1,000 mg Intravenous New Bag/Given 04/24/19 1027)  sodium bicarbonate injection 50 mEq (50 mEq Intravenous Given 04/24/19 1020)    Mobility walks with person  assist Moderate fall risk   Focused Assessments    R Recommendations: See Admitting Provider Note  Report given to:   Additional Notes:

## 2019-04-24 NOTE — ED Notes (Signed)
Ordered diet tray for pt  

## 2019-04-24 NOTE — Evaluation (Signed)
Physical Therapy Evaluation Patient Details Name: Christy May MRN: [id number] DOB: 24-Dec-1950 Today's Date: 04/24/2019   History of Present Illness  Christy May  is a 68 y.o. female, w hypertension, Dm2, neuropathy, anxiety/depression, gerd, dysphgia, chronic back pain, presents with altered mental status, increase in somnolence today at SNF.Pt will be admitted for ARF secondary to dye , and AMS likely secondary to decrease in clearance of gabapentin as well as norco.  Clinical Impression  Patient presents with decreased independence with mobility due to decreased strength, decreased balance with multiple falls, decreased activity tolerance and will benefit from skilled PT during acute stay and follow up PT at SNF.    Follow Up Recommendations SNF    Equipment Recommendations  None recommended by PT    Recommendations for Other Services       Precautions / Restrictions Precautions Precautions: Fall Precaution Comments: R>L LE weakness, history of multiple falls      Mobility  Bed Mobility Overal bed mobility: Needs Assistance Bed Mobility: Supine to Sit;Sit to Supine     Supine to sit: Mod assist;HOB elevated Sit to supine: Mod assist   General bed mobility comments: assist to scoot hips out and lift trunk pt pulling up with HHA, to supine assist for legs onto bed and to scoot up in bed with HOB flat  Transfers                 General transfer comment: not attempted, pt fearful of falling without the "stand up lift"  discussed trying with Stedy if transfers to the floor  Ambulation/Gait                Stairs            Wheelchair Mobility    Modified Rankin (Stroke Patients Only)       Balance Overall balance assessment: Needs assistance Sitting-balance support: Bilateral upper extremity supported;Feet unsupported Sitting balance-Leahy Scale: Fair Sitting balance - Comments: needs at least 1 hand support when seated without foot  support on stretcher in ED       Standing balance comment: NT                             Pertinent Vitals/Pain Pain Assessment: Faces Faces Pain Scale: Hurts even more Pain Location: chronic upper and low back pain Pain Descriptors / Indicators: Aching;Guarding Pain Intervention(s): Monitored during session;Repositioned    Home Living Family/patient expects to be discharged to:: Skilled nursing facility                      Prior Function Level of Independence: Needs assistance   Gait / Transfers Assistance Needed: uses "standing lift" to get to wheelchair at SNF     Comments: Has been at [facility] for a year     Hand Dominance        Extremity/Trunk Assessment   Upper Extremity Assessment Upper Extremity Assessment: Generalized weakness    Lower Extremity Assessment Lower Extremity Assessment: LLE deficits/detail;RLE deficits/detail RLE Deficits / Details: AAROM WFL, strength hip flexion 2/5, knee extension 2-/5, ankle DF 3/5 RLE Sensation: decreased light touch;history of peripheral neuropathy(reports numbness worse in R than L) LLE Deficits / Details: AROM WFL, strength hip flexion 3-/5, knee extension 3+/5, ankle DF 3+/5 LLE Sensation: history of peripheral neuropathy    Cervical / Trunk Assessment Cervical / Trunk Assessment: Kyphotic  Communication   Communication: No difficulties  Cognition Arousal/Alertness: Awake/alert Behavior During Therapy: WFL for tasks assessed/performed Overall Cognitive Status: No family/caregiver present to determine baseline cognitive functioning                                 General Comments: oriented to place and has good understanding of her own personal medical history, but not oriented to time, unable to remember name of her roomate at facility and some dificulty with other types of short term recall      General Comments General comments (skin integrity, edema, etc.): no c/o  dizziness seated EOB with VSS    Exercises     Assessment/Plan    PT Assessment    PT Problem List         PT Treatment Interventions      PT Goals (Current goals can be found in the Care Plan section)  Acute Rehab PT Goals Patient Stated Goal: to return to [facility], continue with PT PT Goal Formulation: With patient Time For Goal Achievement: 05/08/19 Potential to Achieve Goals: Good    Frequency     Barriers to discharge        Co-evaluation               AM-PAC PT "6 Clicks" Mobility  Outcome Measure Help needed turning from your back to your side while in a flat bed without using bedrails?: A Lot Help needed moving from lying on your back to sitting on the side of a flat bed without using bedrails?: Total Help needed moving to and from a bed to a chair (including a wheelchair)?: Total Help needed standing up from a chair using your arms (e.g., wheelchair or bedside chair)?: Total Help needed to walk in hospital room?: Total Help needed climbing 3-5 steps with a railing? : Total 6 Click Score: 7    End of Session Equipment Utilized During Treatment: Gait belt Activity Tolerance: Patient tolerated treatment well Patient left: in bed;with call bell/phone within reach   PT Visit Diagnosis: Muscle weakness (generalized) (M62.81);Other abnormalities of gait and mobility (R26.89)    Time: [id number] PT Time Calculation (min) (ACUTE ONLY): 30 min   Charges:   PT Evaluation $PT Eval Moderate Complexity: 1 Mod PT Treatments $Therapeutic Activity: 8-22 mins        Magda Kiel, [place] [facility] [phone] 04/24/2019   Reginia Naas 04/24/2019, 1:50 PM

## 2019-04-25 DIAGNOSIS — Z7401 Bed confinement status: Secondary | ICD-10-CM | POA: Diagnosis not present

## 2019-04-25 DIAGNOSIS — N183 Chronic kidney disease, stage 3 unspecified: Secondary | ICD-10-CM | POA: Diagnosis not present

## 2019-04-25 DIAGNOSIS — K219 Gastro-esophageal reflux disease without esophagitis: Secondary | ICD-10-CM | POA: Diagnosis present

## 2019-04-25 DIAGNOSIS — Z79891 Long term (current) use of opiate analgesic: Secondary | ICD-10-CM | POA: Diagnosis not present

## 2019-04-25 DIAGNOSIS — I10 Essential (primary) hypertension: Secondary | ICD-10-CM | POA: Diagnosis not present

## 2019-04-25 DIAGNOSIS — Z20828 Contact with and (suspected) exposure to other viral communicable diseases: Secondary | ICD-10-CM | POA: Diagnosis present

## 2019-04-25 DIAGNOSIS — R4182 Altered mental status, unspecified: Secondary | ICD-10-CM | POA: Diagnosis not present

## 2019-04-25 DIAGNOSIS — Z7952 Long term (current) use of systemic steroids: Secondary | ICD-10-CM | POA: Diagnosis not present

## 2019-04-25 DIAGNOSIS — Z8249 Family history of ischemic heart disease and other diseases of the circulatory system: Secondary | ICD-10-CM | POA: Diagnosis not present

## 2019-04-25 DIAGNOSIS — Z96653 Presence of artificial knee joint, bilateral: Secondary | ICD-10-CM | POA: Diagnosis present

## 2019-04-25 DIAGNOSIS — F329 Major depressive disorder, single episode, unspecified: Secondary | ICD-10-CM | POA: Diagnosis present

## 2019-04-25 DIAGNOSIS — T402X5A Adverse effect of other opioids, initial encounter: Secondary | ICD-10-CM | POA: Diagnosis present

## 2019-04-25 DIAGNOSIS — E875 Hyperkalemia: Secondary | ICD-10-CM | POA: Diagnosis present

## 2019-04-25 DIAGNOSIS — M109 Gout, unspecified: Secondary | ICD-10-CM | POA: Diagnosis present

## 2019-04-25 DIAGNOSIS — N179 Acute kidney failure, unspecified: Secondary | ICD-10-CM | POA: Diagnosis present

## 2019-04-25 DIAGNOSIS — E860 Dehydration: Secondary | ICD-10-CM | POA: Diagnosis present

## 2019-04-25 DIAGNOSIS — T426X5A Adverse effect of other antiepileptic and sedative-hypnotic drugs, initial encounter: Secondary | ICD-10-CM | POA: Diagnosis present

## 2019-04-25 DIAGNOSIS — E1122 Type 2 diabetes mellitus with diabetic chronic kidney disease: Secondary | ICD-10-CM | POA: Diagnosis not present

## 2019-04-25 DIAGNOSIS — Z9981 Dependence on supplemental oxygen: Secondary | ICD-10-CM | POA: Diagnosis not present

## 2019-04-25 DIAGNOSIS — Y929 Unspecified place or not applicable: Secondary | ICD-10-CM | POA: Diagnosis not present

## 2019-04-25 DIAGNOSIS — J309 Allergic rhinitis, unspecified: Secondary | ICD-10-CM | POA: Diagnosis present

## 2019-04-25 DIAGNOSIS — Z79899 Other long term (current) drug therapy: Secondary | ICD-10-CM | POA: Diagnosis not present

## 2019-04-25 DIAGNOSIS — Z800 Family history of malignant neoplasm of digestive organs: Secondary | ICD-10-CM | POA: Diagnosis not present

## 2019-04-25 DIAGNOSIS — Z794 Long term (current) use of insulin: Secondary | ICD-10-CM | POA: Diagnosis not present

## 2019-04-25 DIAGNOSIS — G4733 Obstructive sleep apnea (adult) (pediatric): Secondary | ICD-10-CM | POA: Diagnosis present

## 2019-04-25 DIAGNOSIS — R0902 Hypoxemia: Secondary | ICD-10-CM | POA: Diagnosis not present

## 2019-04-25 DIAGNOSIS — E1142 Type 2 diabetes mellitus with diabetic polyneuropathy: Secondary | ICD-10-CM | POA: Diagnosis present

## 2019-04-25 DIAGNOSIS — R5381 Other malaise: Secondary | ICD-10-CM | POA: Diagnosis not present

## 2019-04-25 DIAGNOSIS — Z6841 Body Mass Index (BMI) 40.0 and over, adult: Secondary | ICD-10-CM | POA: Diagnosis not present

## 2019-04-25 DIAGNOSIS — F419 Anxiety disorder, unspecified: Secondary | ICD-10-CM | POA: Diagnosis present

## 2019-04-25 DIAGNOSIS — Z981 Arthrodesis status: Secondary | ICD-10-CM | POA: Diagnosis not present

## 2019-04-25 DIAGNOSIS — M2550 Pain in unspecified joint: Secondary | ICD-10-CM | POA: Diagnosis not present

## 2019-04-25 DIAGNOSIS — R531 Weakness: Secondary | ICD-10-CM | POA: Diagnosis not present

## 2019-04-25 LAB — COMPREHENSIVE METABOLIC PANEL
ALT: 18 U/L (ref 0–44)
AST: 10 U/L — ABNORMAL LOW (ref 15–41)
Albumin: 2.6 g/dL — ABNORMAL LOW (ref 3.5–5.0)
Alkaline Phosphatase: 56 U/L (ref 38–126)
Anion gap: 8 (ref 5–15)
BUN: 32 mg/dL — ABNORMAL HIGH (ref 8–23)
CO2: 30 mmol/L (ref 22–32)
Calcium: 8.9 mg/dL (ref 8.9–10.3)
Chloride: 98 mmol/L (ref 98–111)
Creatinine, Ser: 1.15 mg/dL — ABNORMAL HIGH (ref 0.44–1.00)
GFR calc Af Amer: 56 mL/min — ABNORMAL LOW (ref >60)
GFR calc non Af Amer: 49 mL/min — ABNORMAL LOW (ref >60)
Glucose, Bld: 234 mg/dL — ABNORMAL HIGH (ref 70–99)
Potassium: 3.8 mmol/L (ref 3.5–5.1)
Sodium: 136 mmol/L (ref 135–145)
Total Bilirubin: 0.4 mg/dL (ref 0.3–1.2)
Total Protein: 5.7 g/dL — ABNORMAL LOW (ref 6.5–8.1)

## 2019-04-25 LAB — GLUCOSE, CAPILLARY
Glucose-Capillary: 238 mg/dL — ABNORMAL HIGH (ref 70–99)
Glucose-Capillary: 404 mg/dL — ABNORMAL HIGH (ref 70–99)
Glucose-Capillary: 315 mg/dL — ABNORMAL HIGH (ref 70–99)
Glucose-Capillary: 266 mg/dL — ABNORMAL HIGH (ref 70–99)

## 2019-04-25 LAB — CBC
HCT: 35.6 % — ABNORMAL LOW (ref 36.0–46.0)
Hemoglobin: 10.9 g/dL — ABNORMAL LOW (ref 12.0–15.0)
MCH: 29.2 pg (ref 26.0–34.0)
MCHC: 30.6 g/dL (ref 30.0–36.0)
MCV: 95.4 fL (ref 80.0–100.0)
Platelets: 173 10*3/uL (ref 150–400)
RBC: 3.73 MIL/uL — ABNORMAL LOW (ref 3.87–5.11)
RDW: 14.6 % (ref 11.5–15.5)
WBC: 7 10*3/uL (ref 4.0–10.5)
nRBC: 0 % (ref 0.0–0.2)

## 2019-04-25 LAB — HIV ANTIBODY (ROUTINE TESTING W REFLEX): HIV Screen 4th Generation wRfx: NONREACTIVE

## 2019-04-25 LAB — MAGNESIUM: Magnesium: 1.6 mg/dL — ABNORMAL LOW (ref 1.7–2.4)

## 2019-04-25 LAB — AMMONIA: Ammonia: 19 umol/L (ref 9–35)

## 2019-04-25 MED ORDER — INSULIN ASPART 100 UNIT/ML ~~LOC~~ SOLN
14.0000 [IU] | Freq: Three times a day (TID) | SUBCUTANEOUS | Status: DC
  Administered 2019-04-26 (×3): 14 [IU] via SUBCUTANEOUS

## 2019-04-25 MED ORDER — ENOXAPARIN SODIUM 60 MG/0.6ML ~~LOC~~ SOLN
60.0000 mg | SUBCUTANEOUS | Status: DC
  Administered 2019-04-26: 60 mg via SUBCUTANEOUS
  Filled 2019-04-25: qty 0.6

## 2019-04-25 MED ORDER — HYDROCODONE-ACETAMINOPHEN 7.5-325 MG PO TABS
1.0000 | ORAL_TABLET | ORAL | Status: DC | PRN
  Administered 2019-04-25 – 2019-04-26 (×5): 1 via ORAL
  Filled 2019-04-25 (×5): qty 1

## 2019-04-25 MED ORDER — INSULIN ASPART 100 UNIT/ML ~~LOC~~ SOLN
10.0000 [IU] | Freq: Three times a day (TID) | SUBCUTANEOUS | Status: DC
  Administered 2019-04-25: 14:00:00 12 [IU] via SUBCUTANEOUS

## 2019-04-25 MED ORDER — INSULIN ASPART 100 UNIT/ML ~~LOC~~ SOLN: 14.0000 [IU] | Freq: Three times a day (TID) | SUBCUTANEOUS | Status: DC

## 2019-04-25 MED ORDER — INSULIN DETEMIR 100 UNIT/ML ~~LOC~~ SOLN
35.0000 [IU] | Freq: Two times a day (BID) | SUBCUTANEOUS | Status: DC
  Administered 2019-04-25 – 2019-04-26 (×4): 35 [IU] via SUBCUTANEOUS
  Filled 2019-04-25 (×5): qty 0.35

## 2019-04-25 MED ORDER — INSULIN ASPART 100 UNIT/ML ~~LOC~~ SOLN
12.0000 [IU] | Freq: Three times a day (TID) | SUBCUTANEOUS | Status: DC
  Administered 2019-04-25: 12 [IU] via SUBCUTANEOUS

## 2019-04-25 NOTE — Progress Notes (Signed)
Physical Therapy Treatment Patient Details Name: Christy May MRN: [id number] DOB: 07-21-1950 Today's Date: 04/25/2019    History of Present Illness Pt is a 68 y.o. female admitted from [facility] on 04/23/19 with AMS; worked up for ARF. PMH includes HTN, DM2, neuropathy, anxiety/depression, chronic back pain.   PT Comments    Pt progressing with mobility. Tolerated standing trials and transfer training with modA+2 and Stedy lift; pt motivated to participate and regain strength. Recommend use of Stedy for future transfers with nursing staff. SpO2 95% on RA. Pt without medical complexity to qualify for CIR admission (per [facility]), therefore, continue to recommend SNF-level therapies.   Follow Up Recommendations  SNF;Supervision for mobility/OOB     Equipment Recommendations  None recommended by PT    Recommendations for Other Services       Precautions / Restrictions Precautions Precautions: Fall Precaution Comments: R>L LE weakness, history of multiple falls Restrictions Weight Bearing Restrictions: No    Mobility  Bed Mobility               General bed mobility comments: Received sitting EOB with mobility tech  Transfers Overall transfer level: Needs assistance   Transfers: Sit to/from Stand Sit to Stand: Mod assist;+2 physical assistance         General transfer comment: Pt performed multiple sit<>stands with Stedy from elevated bed height and lower recliner height, requiring consistent modA+2 with heavy reliance on UE support; tolerated well for transfer to recliner. Able to maintain mutliple trials of static standing (~15 sec) with BUE support in [place] and minA+2  Ambulation/Gait                 Stairs             Wheelchair Mobility    Modified Rankin (Stroke Patients Only)       Balance   Sitting-balance support: Bilateral upper extremity supported;Feet unsupported Sitting balance-Leahy Scale: Fair       Standing  balance-Leahy Scale: Poor Standing balance comment: Reliant on UE support and external assist                            Cognition Arousal/Alertness: Awake/alert Behavior During Therapy: WFL for tasks assessed/performed Overall Cognitive Status: No family/caregiver present to determine baseline cognitive functioning                                        Exercises      General Comments General comments (skin integrity, edema, etc.): SpO2 94-95% on RA      Pertinent Vitals/Pain Pain Assessment: Faces Faces Pain Scale: Hurts a little bit Pain Location: chronic upper and low back pain Pain Descriptors / Indicators: Discomfort Pain Intervention(s): Monitored during session;Repositioned    Home Living                      Prior Function            PT Goals (current goals can now be found in the care plan section) Progress towards PT goals: Progressing toward goals    Frequency    Min 2X/week      PT Plan Current plan remains appropriate    Co-evaluation              AM-PAC PT "6 Clicks" Mobility   Outcome Measure  Help needed turning from your back to your side while in a flat bed without using bedrails?: A Lot Help needed moving from lying on your back to sitting on the side of a flat bed without using bedrails?: A Lot Help needed moving to and from a bed to a chair (including a wheelchair)?: A Lot Help needed standing up from a chair using your arms (e.g., wheelchair or bedside chair)?: A Lot Help needed to walk in hospital room?: Total Help needed climbing 3-5 steps with a railing? : Total 6 Click Score: 10    End of Session Equipment Utilized During Treatment: Gait belt Activity Tolerance: Patient tolerated treatment well Patient left: in chair;with call bell/phone within reach Nurse Communication: Mobility status PT Visit Diagnosis: Muscle weakness (generalized) (M62.81);Other abnormalities of gait and mobility  (R26.89)     Time: [id number] PT Time Calculation (min) (ACUTE ONLY): 20 min  Charges:  $Therapeutic Activity: 8-22 mins                     Mabeline Caras, PT, DPT Acute Rehabilitation Services  Pager [phone] Office [place] 04/25/2019, 1:38 PM

## 2019-04-25 NOTE — Progress Notes (Signed)
Writer @ bedside for am labs. Pt with RUE midline. Midline is without blood return after multiple attempts. Flushes without resistance.  Lab notified that VAST unable to draw blood and will need phlebotomist to draw blood. Verbalized understanding. No questions voiced.

## 2019-04-25 NOTE — Progress Notes (Addendum)
PROGRESS NOTE    Christy May  [id number]  DOB: Jan 24, 1951  DOA: 04/23/2019 PCP: Lucianne Lei, MD   Brief Admission Hx: 68 y/o female with hypertension, DM type 2 with neuropathy, GERD, anx/depression, chronic back pain, dysphagia presents with somnolence.  She is coming from [facility]   MDM/Assessment & Plan:   ARF - Pt was clinically dehydrated and responding positively to IV fluids.  Continue. Monitor creatinine.  Monitoring I/Os. Hold home ARB and HCTZ.  Renal US no acute findings.  Hyperkalemia - secondary to ARF.  Treated and resolved.   Hypomagnesemia - IV replacement ordered.  AMS - resolved now.  Pt back to baseline mentation.  Type 2 DM - resume home levemir 35 units BID plus added prandial coverage and SSI, CBG 5 times per day.  Diabetic polyneuropathy - temporarily holding gabapentin due to above.   GERD - protonix ordered for GI protection.   Allergic rhinitis - resumed home medications.  Anemia in CKD stage 3- follow CBC.   Gout - temporarily reduced allopurinol until renal function normalizes.    DVT prophylaxis: lovenox Code Status: Full  Family Communication: patient updated at bedside, verbalized understanding  Disposition Plan: continue inpatient treatments, likely could return to [facility] in next 1-2 days  Consultants:  Procedures:  Antimicrobials:    Subjective: Pt says she is having back pain lying in bed, otherwise is feeling better today.  Has not been ambulated yet.    Objective: Vitals:   04/25/19 0152 04/25/19 0212 04/25/19 0350 04/25/19 0905  BP: 116/63  107/62 (!) 123/55  Pulse: 91  89 93  Resp: 20  20 16   Temp: 98.1 F (36.7 C)  98.4 F (36.9 C) 98.9 F (37.2 C)  TempSrc: Oral  Oral Oral  SpO2: 93%  94% 98%  Weight:  130.9 kg    Height:        Intake/Output Summary (Last 24 hours) at 04/25/2019 0944 Last data filed at 04/25/2019 0900 Gross per 24 hour  Intake 2553.82 ml  Output 1400 ml  Net 1153.82 ml   Filed  Weights   04/24/19 1754 04/25/19 [id number]  Weight: 131.4 kg 130.9 kg     REVIEW OF SYSTEMS  As per history otherwise all reviewed and reported negative  Exam:  General exam: morbidly obese female lying in bed, awake, alert, pleasant and cooperative in no distress.  Respiratory system: Clear. No increased work of breathing. Cardiovascular system: S1 & S2 heard. No JVD, murmurs, gallops, clicks or pedal edema. Gastrointestinal system: Abdomen is nondistended, soft and nontender. Normal bowel sounds heard. Central nervous system: Alert and oriented. No focal neurological deficits. Extremities: no CCE.  Data Reviewed: Basic Metabolic Panel: Recent Labs  Lab 04/21/19 1403 04/23/19 2355 04/24/19 0052 04/25/19 0713  NA 138 133* 135 136  K 4.5 5.4* 5.6* 3.8  CL 97*  --  96* 98  CO2 26  --  28 30  GLUCOSE 209*  --  266* 234*  BUN 35*  --  52* 32*  CREATININE 1.18*  --  2.32* 1.15*  CALCIUM 9.5  --  9.7 8.9  MG  --   --   --  1.6*   Liver Function Tests: Recent Labs  Lab 04/21/19 1403 04/24/19 0052 04/25/19 0713  AST 17 10* 10*  ALT 22 22 18   ALKPHOS 52 67 56  BILITOT 0.2* 0.8 0.4  PROT 6.6 6.6 5.7*  ALBUMIN 3.1* 2.9* 2.6*   Recent Labs  Lab 04/21/19 1403  LIPASE 32   Recent Labs  Lab 04/24/19 0237 04/25/19 0713  AMMONIA 31 19   CBC: Recent Labs  Lab 04/21/19 1403 04/23/19 2355 04/24/19 0052 04/25/19 0713  WBC 9.6  --  8.7 7.0  NEUTROABS 5.5  --  6.8  --   HGB 12.4 12.6 12.2 10.9*  HCT 41.3 37.0 40.2 35.6*  MCV 96.3  --  96.2 95.4  PLT 202  --  190 173   Cardiac Enzymes: No results for input(s): CKTOTAL, CKMB, CKMBINDEX, TROPONINI in the last 168 hours. CBG (last 3)  Recent Labs    04/24/19 1714 04/24/19 2140 04/25/19 0618  GLUCAP 354* 368* 238*   Recent Results (from the past 240 hour(s))  SARS CORONAVIRUS 2 (TAT 6-24 HRS) Nasopharyngeal Nasopharyngeal Swab     Status: None   Collection Time: 04/24/19  3:45 AM   Specimen: Nasopharyngeal Swab   Result Value Ref Range Status   SARS Coronavirus 2 NEGATIVE NEGATIVE Final    Comment: (NOTE) SARS-CoV-2 target nucleic acids are NOT DETECTED. The SARS-CoV-2 RNA is generally detectable in upper and lower respiratory specimens during the acute phase of infection. Negative results do not preclude SARS-CoV-2 infection, do not rule out co-infections with other pathogens, and should not be used as the sole basis for treatment or other patient management decisions. Negative results must be combined with clinical observations, patient history, and epidemiological information. The expected result is Negative. Fact Sheet for Patients: SugarRoll.be Fact Sheet for Healthcare Providers: https://www.woods-mathews.com/ This test is not yet approved or cleared by the [place] FDA and  has been authorized for detection and/or diagnosis of SARS-CoV-2 by FDA under an Emergency Use Authorization (EUA). This EUA will remain  in effect (meaning this test can be used) for the duration of the COVID-19 declaration under Section 56 4(b)(1) of the Act, 21 U.S.C. section 360bbb-3(b)(1), unless the authorization is terminated or revoked sooner. Performed at [facility], [place] [address] [place], [place] [address]   MRSA PCR Screening     Status: Abnormal   Collection Time: 04/24/19  6:28 PM   Specimen: Nasopharyngeal  Result Value Ref Range Status   MRSA by PCR POSITIVE (A) NEGATIVE Final    Comment:        The GeneXpert MRSA Assay (FDA approved for NASAL specimens only), is one component of a comprehensive MRSA colonization surveillance program. It is not intended to diagnose MRSA infection nor to guide or monitor treatment for MRSA infections. RESULT CALLED TO, READ BACK BY AND VERIFIED WITH: NITURADA,L RN 2242 04/24/2019 MITCHELL,L Performed at [facility], [place] [address] [place], [place] [address]      Studies: Ct Head Wo Contrast   Result Date: 04/24/2019 CLINICAL DATA:  Altered level of consciousness EXAM: CT HEAD WITHOUT CONTRAST TECHNIQUE: Contiguous axial images were obtained from the base of the skull through the vertex without intravenous contrast. COMPARISON:  01/27/2018 FINDINGS: Brain: No evidence of acute infarction, hemorrhage, hydrocephalus, extra-axial collection or mass lesion/mass effect. Mild cortical and cerebellar atrophy. Mild white matter disease compatible with chronic small vessel ischemia given medical history. Vascular: Atherosclerotic calcification Skull: Normal. Negative for fracture or focal lesion. Sinuses/Orbits: Bilateral cataract resection IMPRESSION: 1. No evidence of acute intracranial or cervical spine injury. 2. Mild atrophy and chronic small vessel ischemia. Electronically Signed   By: Monte Fantasia M.D.   On: 04/24/2019 04:31   US Renal  Result Date: 04/24/2019 CLINICAL DATA:  Acute renal failure EXAM: RENAL /  URINARY TRACT ULTRASOUND COMPLETE COMPARISON:  Abdominal CT from 3 days ago FINDINGS: Right Kidney: Renal measurements: 10.8 x 4.8 x 5.3 cm = volume: 143 mL . Echogenicity within normal limits. No mass or hydronephrosis visualized. Left Kidney: Renal measurements: 11.7 x 5 x 4.8 cm = volume: 149 mL. Echogenicity within normal limits. No solid mass or hydronephrosis visualized. 18 mm upper pole cortical cyst. Bladder: Appears normal for degree of bladder distention. Other: Sensitivity is degraded by body habitus. There are numerous small cysts by comparison abdominal CT that are not visible on this study. IMPRESSION: 1. No hydronephrosis or acute finding. 2. Small bilateral renal cysts. Electronically Signed   By: Monte Fantasia M.D.   On: 04/24/2019 05:01   Dg Chest Port 1 View  Result Date: 04/24/2019 CLINICAL DATA:  Weakness EXAM: PORTABLE CHEST 1 VIEW COMPARISON:  04/21/2019 FINDINGS: The heart size is enlarged. The lung volumes are low. There is bibasilar atelectasis. No  pneumothorax. There is some vascular congestion without overt pulmonary edema. Patient is status post prior ACDF of the lower cervical spine. IMPRESSION: 1. Low lung volumes with bibasilar atelectasis. In infiltrate is difficult to entirely exclude. 2. Cardiomegaly.  Vascular congestion is noted. Electronically Signed   By: Constance Holster M.D.   On: 04/24/2019 00:51     Scheduled Meds:  allopurinol  100 mg Oral Daily   benzonatate  100 mg Oral QHS   Chlorhexidine Gluconate Cloth  6 each Topical Daily   cycloSPORINE  1 drop Both Eyes BID   docusate sodium  100 mg Oral BID   enoxaparin (LOVENOX) injection  30 mg Subcutaneous Q24H   feeding supplement (PRO-STAT SUGAR FREE 64)  30 mL Oral BID   fluticasone  2 spray Each Nare Daily   guaiFENesin  600 mg Oral BID   insulin aspart  0-5 Units Subcutaneous QHS   insulin aspart  0-9 Units Subcutaneous TID WC   insulin aspart  10 Units Subcutaneous TID WC   insulin detemir  35 Units Subcutaneous BID   lidocaine  1 patch Transdermal Q24H   loratadine  10 mg Oral Daily   metoprolol tartrate  100 mg Oral BID   montelukast  10 mg Oral Daily   mupirocin ointment  1 application Nasal BID   oxybutynin  15 mg Oral QPM   pantoprazole  40 mg Oral BID   polycarbophil  625 mg Oral BID   polyethylene glycol  17 g Oral Daily   predniSONE  20 mg Oral Q breakfast   senna  1 tablet Oral QHS   sodium chloride  2 spray Each Nare BID   sodium chloride flush  10-40 mL Intracatheter Q12H   triamcinolone cream   Topical BID   Continuous Infusions:  sodium chloride 75 mL/hr at 04/25/19 0206    Principal Problem:   ARF (acute renal failure) ([place]) Active Problems:   Essential hypertension   Hyperkalemia   Diabetes ([place])   AMS (altered mental status)   Time spent:   Irwin Brakeman, MD [facility] 04/25/2019, 9:44 AM    LOS: 0 days  How to contact the [facility] Attending or Consulting provider [place] or covering provider during after hours [place], for this patient?  Check the care team in [facility] and look for a) attending/consulting TRH provider listed and b) the [facility] team listed Log into [email or web address] and use [place]'s universal password to access. If you do not have the password, please contact the hospital operator.  Locate the [facility] provider you are looking for under [facility] and page to a number that you can be directly reached. If you still have difficulty reaching the provider, please page the [facility] (Director on Call) for the Hospitalists listed on amion for assistance.

## 2019-04-26 LAB — CBC WITH DIFFERENTIAL/PLATELET
Abs Immature Granulocytes: 0.03 10*3/uL (ref 0.00–0.07)
Basophils Absolute: 0 10*3/uL (ref 0.0–0.1)
Basophils Relative: 1 %
Eosinophils Absolute: 0.1 10*3/uL (ref 0.0–0.5)
Eosinophils Relative: 1 %
HCT: 34.2 % — ABNORMAL LOW (ref 36.0–46.0)
Hemoglobin: 10.6 g/dL — ABNORMAL LOW (ref 12.0–15.0)
Immature Granulocytes: 0 %
Lymphocytes Relative: 30 %
Lymphs Abs: 2.3 10*3/uL (ref 0.7–4.0)
MCH: 29.2 pg (ref 26.0–34.0)
MCHC: 31 g/dL (ref 30.0–36.0)
MCV: 94.2 fL (ref 80.0–100.0)
Monocytes Absolute: 0.6 10*3/uL (ref 0.1–1.0)
Monocytes Relative: 8 %
Neutro Abs: 4.6 10*3/uL (ref 1.7–7.7)
Neutrophils Relative %: 60 %
Platelets: 179 10*3/uL (ref 150–400)
RBC: 3.63 MIL/uL — ABNORMAL LOW (ref 3.87–5.11)
RDW: 14.6 % (ref 11.5–15.5)
WBC: 7.6 10*3/uL (ref 4.0–10.5)
nRBC: 0 % (ref 0.0–0.2)

## 2019-04-26 LAB — COMPREHENSIVE METABOLIC PANEL
ALT: 17 U/L (ref 0–44)
AST: 11 U/L — ABNORMAL LOW (ref 15–41)
Albumin: 2.6 g/dL — ABNORMAL LOW (ref 3.5–5.0)
Alkaline Phosphatase: 54 U/L (ref 38–126)
Anion gap: 11 (ref 5–15)
BUN: 26 mg/dL — ABNORMAL HIGH (ref 8–23)
CO2: 28 mmol/L (ref 22–32)
Calcium: 9.1 mg/dL (ref 8.9–10.3)
Chloride: 101 mmol/L (ref 98–111)
Creatinine, Ser: 1.04 mg/dL — ABNORMAL HIGH (ref 0.44–1.00)
GFR calc Af Amer: >60 mL/min (ref >60)
GFR calc non Af Amer: 55 mL/min — ABNORMAL LOW (ref >60)
Glucose, Bld: 215 mg/dL — ABNORMAL HIGH (ref 70–99)
Potassium: 3.7 mmol/L (ref 3.5–5.1)
Sodium: 140 mmol/L (ref 135–145)
Total Bilirubin: 0.1 mg/dL — ABNORMAL LOW (ref 0.3–1.2)
Total Protein: 5.9 g/dL — ABNORMAL LOW (ref 6.5–8.1)

## 2019-04-26 LAB — GLUCOSE, CAPILLARY
Glucose-Capillary: 199 mg/dL — ABNORMAL HIGH (ref 70–99)
Glucose-Capillary: 263 mg/dL — ABNORMAL HIGH (ref 70–99)
Glucose-Capillary: 343 mg/dL — ABNORMAL HIGH (ref 70–99)
Glucose-Capillary: 267 mg/dL — ABNORMAL HIGH (ref 70–99)

## 2019-04-26 MED ORDER — GABAPENTIN 300 MG PO CAPS: 300.0000 mg | ORAL_CAPSULE | Freq: Three times a day (TID) | ORAL | 0 refills | Status: DC

## 2019-04-26 MED ORDER — HYDROCODONE-ACETAMINOPHEN 5-325 MG PO TABS: 2.0000 | ORAL_TABLET | Freq: Four times a day (QID) | ORAL | 0 refills | Status: DC | PRN

## 2019-04-26 MED ORDER — PRO-STAT SUGAR FREE PO LIQD: 30.0000 mL | Freq: Two times a day (BID) | ORAL | 0 refills | Status: DC

## 2019-04-26 NOTE — TOC Transition Note (Addendum)
Transition of Care [facility]) - CM/SW Discharge Note   Patient Details  Name: Christy May MRN: [id number] Date of Birth: 12-Mar-1951  Transition of Care [facility]) CM/SW Contact:  Zenon Mayo, RN Phone Number: 04/26/2019, 5:17 PM   Clinical Narrative:    Per CSW , patient is refusing SNF would like to go home with [facility] serives, choice offered,  she has no preference of [place] agency, NCM made referral to [facility] , they were able to take referral for [place], [place], [place], [place] and Social work.  Soc will begin 24- 48 hrs post dc. Patient also wants a stand up lift , per zack they are in the [place] store.  [facility] called for transport. Staff RN Levada Dy notified.     Final next level of care: [place] Barriers to Discharge: No Barriers Identified   Patient Goals and CMS Choice Patient states their goals for this hospitalization and ongoing recovery are:: home with [facility] CMS Medicare.gov Compare Post Acute Care list provided to:: Patient Choice offered to / list presented to : Patient  Discharge Placement                       Discharge Plan and Services     Post Acute Care Choice: [place]          DME Arranged: (stand up lift) DME Agency: [employer] Date DME Agency Contacted: 04/26/19 Time DME Agency Contacted: [phone] Representative spoke with at DME Agency: zack HH Arranged: RN, PT, OT, Nurse's Aide, Social Work [employer] Agency: [place] ([place]) Date [place]: 04/26/19 Time [place]: 1717 Representative spoke with at [place]: valerie  Social Determinants of Health ([place]) Interventions     Readmission Risk Interventions No flowsheet data found.

## 2019-04-26 NOTE — TOC Progression Note (Addendum)
Transition of Care [facility]) - Progression Note    Patient Details  Name: Christy May MRN: [id number] Date of Birth: [birth year]  Transition of Care [facility]) CM/SW [place] Phone Number: [phone] 04/26/2019, 9:58 AM  Clinical Narrative:     Update: CSW spoke with patient at bedside, she reports she is refusing [place] SNF at this time and reports she wants to go home. She states this was the original plan 2 weeks ago when she was getting ready to leave [facility], was for home health to be set up and her to go home. She reports she lives with her sister and has all equipment needs excluding a hoyer lift.   CSW has messaged MD for orders for Home Health PT, OT, RN, aide, and social work. CSW has also requested orders for DME hoyer lift.             CSW spoke with Olivia Mackie at [facility] who reports patient was discharged from their facility however [facility] has agreed to take her. Olivia Mackie reports she has not told patient this yet, CSW has requested Olivia Mackie updated patient on her discharge from their facility, CSW is following up with [place]. CSW has reached out to [facility] as well.   Expected Discharge Plan: [place] Barriers to Discharge: (pending response from [facility])  Expected Discharge Plan and Services Expected Discharge Plan: [place] Choice: [place] arrangements for the past 2 months: Skilled Nursing Facility([facility]) Expected Discharge Date: 04/26/19                                     Social Determinants of Health (SDOH) Interventions    Readmission Risk Interventions No flowsheet data found.

## 2019-04-26 NOTE — Discharge Summary (Signed)
Physician Discharge Summary  Christy May [id number] DOB: [birth year] DOA: 04/23/2019  PCP: Lucianne Lei, MD  Admit date: 04/23/2019 Discharge date: 04/26/2019  Time spent: 35 minutes  Recommendations for Outpatient Follow-up:  1. Recommending down titration of some antihypertensive meds given AKI see MAR 2. Recommend discontinuation/cut back of gabapentin to lower doses of 300 and titration back up to normal dose in the outpatient setting if mentation remains stable 3. Needs renal panel in addition to CBC in about 1 week 4. Recommend cutting back dosing of prednisone in the outpatient setting by pulmonology-she is on this for chronic bronchitis   Discharge Diagnoses:  Principal Problem:   ARF (acute renal failure) ([place]) Active Problems:   Essential hypertension   Hyperkalemia   Diabetes ([place])   AMS (altered mental status)   Discharge Condition: Improved  Diet recommendation: Renal heart healthy  Filed Weights   04/24/19 1754 04/25/19 0212 04/26/19 0600  Weight: 131.4 kg 130.9 kg 130.6 kg    History of present illness:  68 yr old blk fem Body mass index is 49.42 kg/m. chr LBP, DM ty II + neuropathy/nephropathy, OSA/CPAP as well as upper airway syndrome/COPD, hoiatal hernia Vit D def, relfux, migraine headaches, HTN, vitamin D deficiency, prior colonic polyps, bipolar-thoracic spinal stenosis status post surgery, gout-On chronic steroids? Admit 04/24/2019 with somnolence in ED afebrile CT head showed no acute findings chest x-ray showed vascular congestion low lung volumes K5.6 BUN/creatinine 52/2.3  Hospital Course:  Somnolence probably secondary to AKI but superimposed polypharmacy AKI + hyperkalemia Altered mental status Patient was admitted to the hospital after being seen in the emergency room on 10/30 she was given IV pain meds and patient was somnolent came back and was found to have an elevated creatinine 2.3-it was noted she was on ARB as well as on HCTZ and  these were discontinued and her creatinine in addition to her mental status improved Also note that the patient's gabapentin was cut back to 300 3 times daily down from a high dose of 900 3 times daily and she was recommended to get labs in the outpatient setting HTN Cut back outpatient Diovan HCTZ and only use metoprolol twice daily for now-May add amlodipine in the outpatient setting-labs in the next 1 week DM TY 2 Blood sugar slightly elevated CBG 1 99-66 monitor trends in the outpatient setting continue Lantus for 40 eating 50% of meals OSA on CPAP Continue CPAP in outpatient setting Upper airway syndrome Reflux Patient tells me she has been on low-dose steroids for a long time because of chronic bronchitis-it is been recommended that she follow-up with Dr. Lamonte Sakai her pulmonary doctor cut back the doses Prior thoracic spinal stenosis Patient has not walked in the past several years unclear etiology she has had several falls-it is not clear what etiology is she will need further work-up if indicated in the outpatient setting Gout  Awake alert coherent seems to ramble to some degree no focal deficit Discharge Exam: Vitals:   04/26/19 0022 04/26/19 0424  BP:  140/66  Pulse: 71 91  Resp: 18 20  Temp:  98 F (36.7 C)  SpO2: 96% 95%    General: EOMI NCAT Cardiovascular: S1-S2 no murmur rub or gallop Respiratory: Clinically clear no added sound no rales or rhonchi  Discharge Instructions   Discharge Instructions    Diet - low sodium heart healthy   Complete by: As directed    Increase activity slowly   Complete by: As directed  Allergies as of 04/26/2019      Reactions   Ace Inhibitors Anaphylaxis, Cough   Tolerates Irbesartan (home med)   Penicillins Hives, Other (See Comments)   Tolerated ceftriaxone in 2019 Has patient had a PCN reaction causing immediate rash, facial/tongue/throat swelling, SOB or lightheadedness with hypotension: Yes Has patient had a PCN reaction  causing severe rash involving mucus membranes or skin necrosis:  No Has patient had a PCN reaction that required hospitalization: No Has patient had a PCN reaction occurring within the last 10 years: No If all of the above answers are "NO", then may proceed with Cephalosporin use.   Adhesive [tape] Hives, Rash      Medication List    STOP taking these medications   hydrochlorothiazide 12.5 MG capsule Commonly known as: MICROZIDE   irbesartan 300 MG tablet Commonly known as: AVAPRO     TAKE these medications   allopurinol 300 MG tablet Commonly known as: ZYLOPRIM Take 300 mg by mouth daily.   Aspercreme Lidocaine 4 % Ptch Generic drug: Lidocaine Place 1 patch onto the skin 2 (two) times daily.   benzonatate 100 MG capsule Commonly known as: TESSALON Take 100 mg by mouth at bedtime.   cyclobenzaprine 10 MG tablet Commonly known as: FLEXERIL Take 1 tablet (10 mg total) by mouth 3 (three) times daily as needed for muscle spasms.   cycloSPORINE 0.05 % ophthalmic emulsion Commonly known as: RESTASIS Place 1 drop into both eyes 2 (two) times daily.   docusate sodium 100 MG capsule Commonly known as: COLACE Take 1 capsule (100 mg total) by mouth 2 (two) times daily.   feeding supplement (PRO-STAT SUGAR FREE 64) Liqd Take 30 mLs by mouth 2 (two) times daily.   fluticasone 50 MCG/ACT nasal spray Commonly known as: FLONASE SHAKE LIQUID AND USE 2 SPRAYS IN EACH NOSTRIL DAILY What changed: See the new instructions.   gabapentin 300 MG capsule Commonly known as: NEURONTIN Take 1 capsule (300 mg total) by mouth 3 (three) times daily. What changed: how much to take   guaiFENesin 600 MG 12 hr tablet Commonly known as: MUCINEX Take 600 mg by mouth 2 (two) times daily.   HYDROcodone-acetaminophen 5-325 MG tablet Commonly known as: NORCO/VICODIN Take 2 tablets by mouth every 6 (six) hours as needed (pain).   insulin aspart 100 UNIT/ML injection Commonly known as:  novoLOG Inject 12 Units into the skin 3 (three) times daily with meals.   insulin detemir 100 UNIT/ML injection Commonly known as: LEVEMIR Inject 40 Units into the skin 2 (two) times daily.   INSULIN SYRINGE 1CC/31GX5/16" 31G X 5/16" 1 ML Misc USE TO INJECT INSULIN TWICE DAILY   loratadine 10 MG tablet Commonly known as: CLARITIN Take 10 mg by mouth daily.   meclizine 25 MG tablet Commonly known as: ANTIVERT Take 1 tablet (25 mg total) by mouth 3 (three) times daily as needed for dizziness.   metoprolol tartrate 100 MG tablet Commonly known as: LOPRESSOR Take 100 mg by mouth 2 (two) times daily.   montelukast 10 MG tablet Commonly known as: SINGULAIR TAKE 1 TABLET(10 MG) BY MOUTH DAILY What changed: See the new instructions.   multivitamin with minerals tablet Take 1 tablet by mouth at bedtime.   ONE TOUCH LANCETS Misc Use to check sugars twice daily DX E11.22   oxybutynin 15 MG 24 hr tablet Commonly known as: DITROPAN XL Take 15 mg by mouth every evening.   OXYGEN Inhale 2 L/min into the lungs.   pantoprazole  40 MG tablet Commonly known as: PROTONIX TAKE 1 TABLET(40 MG) BY MOUTH TWICE DAILY What changed: See the new instructions.   polycarbophil 625 MG tablet Commonly known as: FIBERCON Take 625 mg by mouth 2 (two) times daily.   polyethylene glycol 17 g packet Commonly known as: MIRALAX / GLYCOLAX Take 17 g by mouth 2 (two) times daily. What changed:   when to take this  additional instructions   predniSONE 20 MG tablet Commonly known as: DELTASONE Take 20 mg by mouth daily with breakfast.   PRESCRIPTION MEDICATION Inhale into the lungs at bedtime. CPAP   senna 8.6 MG Tabs tablet Commonly known as: SENOKOT Take 1 tablet (8.6 mg total) by mouth daily. What changed: when to take this   sodium chloride 0.65 % Soln nasal spray Commonly known as: OCEAN Place 2 sprays into both nostrils 2 (two) times daily.   triamcinolone cream 0.1 % Commonly  known as: KENALOG Apply 1 application topically See admin instructions. Apply 2 grams topically to bilateral forearms twice daily   Trulicity 1.5 0000000 Sopn Generic drug: Dulaglutide Inject 1.5 mg into the skin every Friday.   Vitamin D-3 25 MCG (1000 UT) Caps Take 2,000 Units by mouth daily.      Allergies  Allergen Reactions  . Ace Inhibitors Anaphylaxis and Cough    Tolerates Irbesartan (home med)  . Penicillins Hives and Other (See Comments)    Tolerated ceftriaxone in 2019  Has patient had a PCN reaction causing immediate rash, facial/tongue/throat swelling, SOB or lightheadedness with hypotension: Yes Has patient had a PCN reaction causing severe rash involving mucus membranes or skin necrosis:  No Has patient had a PCN reaction that required hospitalization: No Has patient had a PCN reaction occurring within the last 10 years: No If all of the above answers are "NO", then may proceed with Cephalosporin use.  . Adhesive [Tape] Hives and Rash      The results of significant diagnostics from this hospitalization (including imaging, microbiology, ancillary and laboratory) are listed below for reference.    Significant Diagnostic Studies: Dg Chest 2 View  Result Date: 04/21/2019 CLINICAL DATA:  Chest pain. EXAM: CHEST - 2 VIEW COMPARISON:  04/22/2018. FINDINGS: Stable cardiomegaly. Mild bibasilar atelectasis/infiltrates. Small bilateral pleural effusions. Cervicothoracic spine fusion. IMPRESSION: 1.  Stable cardiomegaly. 2. Mild bibasilar atelectasis/infiltrates. Small bilateral pleural effusions. Electronically Signed   By: [place]   On: 04/21/2019 11:38   Ct Head Wo Contrast  Result Date: 04/24/2019 CLINICAL DATA:  Altered level of consciousness EXAM: CT HEAD WITHOUT CONTRAST TECHNIQUE: Contiguous axial images were obtained from the base of the skull through the vertex without intravenous contrast. COMPARISON:  01/27/2018 FINDINGS: Brain: No evidence of acute  infarction, hemorrhage, hydrocephalus, extra-axial collection or mass lesion/mass effect. Mild cortical and cerebellar atrophy. Mild white matter disease compatible with chronic small vessel ischemia given medical history. Vascular: Atherosclerotic calcification Skull: Normal. Negative for fracture or focal lesion. Sinuses/Orbits: Bilateral cataract resection IMPRESSION: 1. No evidence of acute intracranial or cervical spine injury. 2. Mild atrophy and chronic small vessel ischemia. Electronically Signed   By: Monte Fantasia M.D.   On: 04/24/2019 04:31   Ct Abdomen Pelvis W Contrast  Result Date: 04/21/2019 CLINICAL DATA:  Abdominal distension. EXAM: CT ABDOMEN AND PELVIS WITH CONTRAST TECHNIQUE: Multidetector CT imaging of the abdomen and pelvis was performed using the standard protocol following bolus administration of intravenous contrast. CONTRAST:  129mL OMNIPAQUE IOHEXOL 300 MG/ML  SOLN COMPARISON:  February 28, 2015  and August 30, 2017 FINDINGS: Lower chest: Plate like opacities in the bases, right greater than left, are most consistent with atelectasis. No focal infiltrate to suggest pneumonia. No nodules or masses. The lower chest is otherwise unremarkable. Hepatobiliary: The patient is status post cholecystectomy. The common bile duct is dilated to 2.2 cm today versus 2.3 cm in September 2016. Mild intrahepatic ductal dilatation is identified, mildly more prominent compared to previous studies. No filling defects are identified. No liver masses are noted. The portal vein is normal. Pancreas: Unremarkable. No pancreatic ductal dilatation or surrounding inflammatory changes. Spleen: Normal in size without focal abnormality. Adrenals/Urinary Tract: Adrenal glands are normal. Multiple small cysts are seen in the kidneys, some too small to characterize. No suspicious solid masses are identified. No hydronephrosis or acute perinephric stranding. The ureters are normal with no dilatation or stone. The  bladder is unremarkable. Stomach/Bowel: The stomach and small bowel are normal. The colon is normal. The appendix is not visualized but there is no secondary evidence of appendicitis. Vascular/Lymphatic: Atherosclerotic changes are seen in the nonaneurysmal aorta. No adenopathy. Reproductive: Status post hysterectomy. No adnexal masses. Other: No abdominal wall hernia or abnormality. No abdominopelvic ascites. Musculoskeletal: No acute or significant osseous findings. IMPRESSION: 1. Platelike opacities in the lung bases are most consistent with atelectasis. 2. Intra and extrahepatic biliary duct dilatation. The common bile duct dilatation is similar since 2016, likely due to previous cholecystectomy. The intrahepatic ductal dilatation is mildly more prominent but probably normal for this patient. Recommend correlation with labs to exclude signs of biliary obstruction. 3. Numerous cysts in the kidneys. 4. Status post appendectomy. 5. Atherosclerotic changes in the nonaneurysmal aorta. Electronically Signed   By: Dorise Bullion III M.D   On: 04/21/2019 18:49   US Renal  Result Date: 04/24/2019 CLINICAL DATA:  Acute renal failure EXAM: RENAL / URINARY TRACT ULTRASOUND COMPLETE COMPARISON:  Abdominal CT from 3 days ago FINDINGS: Right Kidney: Renal measurements: 10.8 x 4.8 x 5.3 cm = volume: 143 mL . Echogenicity within normal limits. No mass or hydronephrosis visualized. Left Kidney: Renal measurements: 11.7 x 5 x 4.8 cm = volume: 149 mL. Echogenicity within normal limits. No solid mass or hydronephrosis visualized. 18 mm upper pole cortical cyst. Bladder: Appears normal for degree of bladder distention. Other: Sensitivity is degraded by body habitus. There are numerous small cysts by comparison abdominal CT that are not visible on this study. IMPRESSION: 1. No hydronephrosis or acute finding. 2. Small bilateral renal cysts. Electronically Signed   By: Monte Fantasia M.D.   On: 04/24/2019 05:01   Dg Chest Port  1 View  Result Date: 04/24/2019 CLINICAL DATA:  Weakness EXAM: PORTABLE CHEST 1 VIEW COMPARISON:  04/21/2019 FINDINGS: The heart size is enlarged. The lung volumes are low. There is bibasilar atelectasis. No pneumothorax. There is some vascular congestion without overt pulmonary edema. Patient is status post prior ACDF of the lower cervical spine. IMPRESSION: 1. Low lung volumes with bibasilar atelectasis. In infiltrate is difficult to entirely exclude. 2. Cardiomegaly.  Vascular congestion is noted. Electronically Signed   By: Constance Holster M.D.   On: 04/24/2019 00:51    Microbiology: Recent Results (from the past 240 hour(s))  SARS CORONAVIRUS 2 (TAT 6-24 HRS) Nasopharyngeal Nasopharyngeal Swab     Status: None   Collection Time: 04/24/19  3:45 AM   Specimen: Nasopharyngeal Swab  Result Value Ref Range Status   SARS Coronavirus 2 NEGATIVE NEGATIVE Final    Comment: (NOTE) SARS-CoV-2 target  nucleic acids are NOT DETECTED. The SARS-CoV-2 RNA is generally detectable in upper and lower respiratory specimens during the acute phase of infection. Negative results do not preclude SARS-CoV-2 infection, do not rule out co-infections with other pathogens, and should not be used as the sole basis for treatment or other patient management decisions. Negative results must be combined with clinical observations, patient history, and epidemiological information. The expected result is Negative. Fact Sheet for Patients: SugarRoll.be Fact Sheet for Healthcare Providers: https://www.woods-mathews.com/ This test is not yet approved or cleared by the [place] FDA and  has been authorized for detection and/or diagnosis of SARS-CoV-2 by FDA under an Emergency Use Authorization (EUA). This EUA will remain  in effect (meaning this test can be used) for the duration of the COVID-19 declaration under Section 56 4(b)(1) of the Act, 21 U.S.C. section  360bbb-3(b)(1), unless the authorization is terminated or revoked sooner. Performed at [facility], [place] [address], [place], [place] [address]   MRSA PCR Screening     Status: Abnormal   Collection Time: 04/24/19  6:28 PM   Specimen: Nasopharyngeal  Result Value Ref Range Status   MRSA by PCR POSITIVE (A) NEGATIVE Final    Comment:        The GeneXpert MRSA Assay (FDA approved for NASAL specimens only), is one component of a comprehensive MRSA colonization surveillance program. It is not intended to diagnose MRSA infection nor to guide or monitor treatment for MRSA infections. RESULT CALLED TO, READ BACK BY AND VERIFIED WITH: NITURADA,L RN 2242 04/24/2019 MITCHELL,L Performed at [facility], [place] [address], [place], [place] [address]      Labs: Basic Metabolic Panel: Recent Labs  Lab 04/21/19 1403 04/23/19 2355 04/24/19 0052 04/25/19 0713 04/26/19 0352  NA 138 133* 135 136 140  K 4.5 5.4* 5.6* 3.8 3.7  CL 97*  --  96* 98 101  CO2 26  --  28 30 28   GLUCOSE 209*  --  266* 234* 215*  BUN 35*  --  52* 32* 26*  CREATININE 1.18*  --  2.32* 1.15* 1.04*  CALCIUM 9.5  --  9.7 8.9 9.1  MG  --   --   --  1.6*  --    Liver Function Tests: Recent Labs  Lab 04/21/19 1403 04/24/19 0052 04/25/19 0713 04/26/19 0352  AST 17 10* 10* 11*  ALT 22 22 18 17   ALKPHOS 52 67 56 54  BILITOT 0.2* 0.8 0.4 0.1*  PROT 6.6 6.6 5.7* 5.9*  ALBUMIN 3.1* 2.9* 2.6* 2.6*   Recent Labs  Lab 04/21/19 1403  LIPASE 32   Recent Labs  Lab 04/24/19 0237 04/25/19 0713  AMMONIA 31 19   CBC: Recent Labs  Lab 04/21/19 1403 04/23/19 2355 04/24/19 0052 04/25/19 0713 04/26/19 0352  WBC 9.6  --  8.7 7.0 7.6  NEUTROABS 5.5  --  6.8  --  4.6  HGB 12.4 12.6 12.2 10.9* 10.6*  HCT 41.3 37.0 40.2 35.6* 34.2*  MCV 96.3  --  96.2 95.4 94.2  PLT 202  --  190 173 179   Cardiac Enzymes: No results for input(s): CKTOTAL, CKMB, CKMBINDEX, TROPONINI in the last 168 hours. BNP: BNP  (last 3 results) Recent Labs    04/21/19 1409  BNP 30.9    ProBNP (last 3 results) No results for input(s): PROBNP in the last 8760 hours.  CBG: Recent Labs  Lab 04/25/19 0618 04/25/19 1248 04/25/19 1724 04/25/19 2200 04/26/19 0636  GLUCAP 238* Ali Chuk       Signed:  Nita Sells MD   [facility] 04/26/2019, 9:10 AM

## 2019-04-26 NOTE — Progress Notes (Signed)
RT placed pt on CPAP dream station for the night on home setting of 4 cmH2O w/no O2 bled into the system. Pt respiratory status is stable on CPAP at this time. RT will continue to monitor.

## 2019-04-26 NOTE — TOC Transition Note (Addendum)
Transition of Care [facility]) - CM/SW Discharge Note   Patient Details  Name: Christy May MRN: [id number] Date of Birth: [birth year]  [facility] CM/SW Contact:  Zenon Mayo, RN Phone Number: 04/26/2019, 4:51 PM   Clinical Narrative:    Per CSW , patient is refusing SNF would like to go home with [facility] serives, choice offered,  she has no preference of [place] agency, NCM made referral to [facility] , they were able to take referral for [place], [place], [place], [place] and Social work.  Soc will begin 24- 48 hrs post dc.    Final next level of care: [place] Barriers to Discharge: No Barriers Identified   Patient Goals and CMS Choice Patient states their goals for this hospitalization and ongoing recovery are:: go home with [facility] CMS Medicare.gov Compare Post Acute Care list provided to:: Patient Choice offered to / list presented to : Patient  Discharge Placement                       Discharge Plan and Services     Post Acute Care Choice: [place]          DME Arranged: (hoyer lift) DME Agency: [employer] Date DME Agency Contacted: 04/26/19 Time DME Agency Contacted: [id number] Representative spoke with at DME Agency: zack HH Arranged: RN, PT, OT, Nurse's Aide, Social Work [employer] Agency: [place] Date [place]: 04/26/19 Time [place]: 1651 Representative spoke with at [place]: [place] ([place]) Interventions     Readmission Risk Interventions No flowsheet data found.

## 2019-04-26 NOTE — TOC Transition Note (Signed)
Transition of Care [facility]) - CM/SW Discharge Note   Patient Details  Name: Christy May MRN: [id number] Date of Birth: [birth year]  [facility]) CM/SW Contact:  Alberteen Sam, [place] Phone Number: [phone] 04/26/2019, 9:46 AM   Clinical Narrative:     CSW spoke with patient regarding discharge planning, agreeable to go back to [facility], requested CSW to updated her daughter about discharge plan.   CSW has sent Miquel Dunn all discharge paperwork and reached out to admissions Olivia Mackie to ensure they are able to take patient back today, pending response from [place] at this time.     Barriers to Discharge: (pending response from [facility])   Patient Goals and CMS Choice Patient states their goals for this hospitalization and ongoing recovery are:: to go back to [facility].gov Compare Post Acute Care list provided to:: Patient Choice offered to / list presented to : Patient  Discharge Placement                       Discharge Plan and Services     Post Acute Care Choice: [place]                               Social Determinants of Health (SDOH) Interventions     Readmission Risk Interventions No flowsheet data found.

## 2019-04-26 NOTE — Progress Notes (Signed)
Pt was planned to discharge at home via [facility] see note by case management and charge nurse/ RN on dayshift.  Suddenly Patient changed her mind, her reason was CM never told her the plan, Nobody called her sister, as far the notes says SNF was offered to her but patient refused and decided to go home with home  Health. Explained to pt that transport was already set up to pick her up. Called  Charge Nurse to speak with the patient. By the time  Charge nurse entered patient room, patient said " I will Just Go home"  meds prescription was placed on her Discharge papers.  All evening meds were given.   [facility] came , Patient sister Christy May called and was informed about patient Discharge to home and on the way.

## 2019-04-26 NOTE — Progress Notes (Signed)
Spoke with ED CM; MD has signed off on this patient to discharge. She spoke with Neoma Laming earlier and she was agreeable to go home with home health with [facility]. Her primary nurse, Serita Grammes, states that she has now changed her mind. Primary RN to contact physician regarding this situation.

## 2019-04-26 NOTE — Progress Notes (Signed)
Pt informed she has discharge orders and MD says she is ready to go with no current  Medical needs, pt aware of [facility] coming to take home, she is on the phone with her girlfriend and her sister lives with pt but has needs as well. I advised she was planning to dc [facility] with plan to return home with [facility] which was set up today per her conversation with  The CM

## 2019-04-26 NOTE — [place] FL2 (Signed)
[place] MEDICAID FL2 LEVEL OF CARE SCREENING TOOL     IDENTIFICATION  Patient Name: Christy May Birthdate: 07-25-1950 Sex: female Admission Date (Current Location): 04/23/2019  [facility] and [place] Number:  Herbalist and Address:  [facility] [place], [place] [address], [place], [place] [address]      Provider Number: [id number]  Attending Physician Name and Address:  Nita Sells, MD  Relative Name and Phone Number:  daughter [name] [phone]    Current Level of Care: Hospital Recommended Level of Care: [place] Prior Approval Number:    Date Approved/Denied:   PASRR Number: [id number]  Discharge Plan: SNF    Current Diagnoses: Patient Active Problem List   Diagnosis Date Noted  . ARF (acute renal failure) ([place]) 04/24/2019  . AMS (altered mental status) 04/24/2019  . Fungal skin infection 03/25/2018  . Thoracic spondylosis with myelopathy 02/17/2018  . Spondylosis, thoracic, with myelopathy 02/17/2018  . Dehydration 02/11/2018  . AKI (acute kidney injury) ([place]) 02/11/2018  . Hypotension 02/11/2018  . Syncope, vasovagal 02/11/2018  . Syncope 02/11/2018  . DM neuropathy, type II diabetes mellitus ([place]) 11/18/2017  . Weakness 11/18/2017  . SIRS (systemic inflammatory response syndrome) ([place]) 11/17/2017  . Diabetes mellitus type 2 in obese ([place]) 11/17/2017  . Acute lower UTI 11/17/2017  . Acute renal failure (ARF) ([place]) 08/13/2017  . Pressure injury of skin 08/13/2017  . Sepsis due to gram-negative UTI ([place]) 08/12/2017  . Upper airway cough syndrome 09/28/2016  . Acute bronchitis 10/29/2015  . Diabetes ([place]) 08/21/2015  . Chest pain 07/14/2015  . Dyspnea on exertion 07/05/2015  . Obstructive sleep apnea 07/05/2015  . Constipation 03/05/2015  . Allergic rhinitis 03/05/2015  . Hearing loss 03/05/2015  . Vocal cord dysfunction 03/05/2015  . Pain in joint, shoulder region 09/11/2014  . Weakness  generalized 09/02/2014  . Acute esophagitis 05/08/2014  . Benign neoplasm of sigmoid colon 05/08/2014  . Change in bowel habits 04/17/2014  . Fecal incontinence 04/17/2014  . Urge incontinence 04/08/2014  . Cough 12/06/2013  . Orthostasis 07/18/2013  . Hyperkalemia 07/18/2013  . Acute kidney injury ([place]) 07/18/2013  . Bilateral carpal tunnel syndrome 02/14/2013  . Gout 01/04/2013  . Failure to thrive 06/28/2012  . Gait disorder 06/28/2012  . Lower abdominal pain 12/14/2011  . Preventative health care 08/23/2011  . Cervical radiculopathy 02/11/2011  . Low back pain 02/11/2011  . VITAMIN D DEFICIENCY 11/22/2009  . ANEMIA-NOS 11/22/2009  . DEGENERATIVE JOINT DISEASE, CERVICAL SPINE 11/22/2009  . Depression 09/16/2009  . POLYNEUROPATHY 09/16/2009  . Morbid obesity ([place]) 09/09/2009  . Essential hypertension 09/09/2009  . GERD 09/09/2009  . Sleep apnea 09/09/2009    Orientation RESPIRATION BLADDER Height & Weight     Time, Self, Situation, Place  Normal Incontinent, External catheter Weight: 287 lb 14.7 oz (130.6 kg) Height:  5\' 4"  (162.6 cm)  BEHAVIORAL SYMPTOMS/MOOD NEUROLOGICAL BOWEL NUTRITION STATUS      Incontinent Diet(see discharge summary)  AMBULATORY STATUS COMMUNICATION OF NEEDS Skin   Extensive Assist Verbally Other (Comment)(MASD buttocks)                       Personal Care Assistance Level of Assistance  Bathing, Feeding, Dressing, Total care Bathing Assistance: Maximum assistance Feeding assistance: Independent Dressing Assistance: Maximum assistance Total Care Assistance: Maximum assistance   Functional Limitations Info  Sight, Hearing, Speech Sight Info: Adequate Hearing Info: Adequate Speech Info: Adequate    SPECIAL CARE FACTORS  FREQUENCY  PT (By licensed PT), OT (By licensed OT)     PT Frequency: min 5x weekly OT Frequency: min 5x weekly            Contractures Contractures Info: Not present    Additional Factors Info  Code  Status, Allergies Code Status Info: full Allergies Info: ace inhibitors, penicillins, adhesive (tape)           Current Medications (04/26/2019):  This is the current hospital active medication list Current Facility-Administered Medications  Medication Dose Route Frequency Provider Last Rate Last Dose  . 0.9 %  sodium chloride infusion   Intravenous Continuous Murlean Iba, MD   Stopped at 04/26/19 0906  . acetaminophen (TYLENOL) tablet 650 mg  650 mg Oral Q6H PRN Jani Gravel, MD   650 mg at 04/24/19 1754   Or  . acetaminophen (TYLENOL) suppository 650 mg  650 mg Rectal Q6H PRN Jani Gravel, MD      . allopurinol (ZYLOPRIM) tablet 100 mg  100 mg Oral Daily Jani Gravel, MD   100 mg at 04/26/19 0847  . benzonatate (TESSALON) capsule 100 mg  100 mg Oral QHS Jani Gravel, MD   100 mg at 04/25/19 2143  . Chlorhexidine Gluconate Cloth 2 % PADS 6 each  6 each Topical Daily Wynetta Emery, Clanford L, MD   6 each at 04/25/19 1337  . cyclobenzaprine (FLEXERIL) tablet 10 mg  10 mg Oral TID PRN Jani Gravel, MD   10 mg at 04/26/19 0845  . cycloSPORINE (RESTASIS) 0.05 % ophthalmic emulsion 1 drop  1 drop Both Eyes BID Jani Gravel, MD   1 drop at 04/26/19 0853  . docusate sodium (COLACE) capsule 100 mg  100 mg Oral BID Jani Gravel, MD   100 mg at 04/26/19 0847  . enoxaparin (LOVENOX) injection 60 mg  60 mg Subcutaneous Q24H Lyndee Leo, RPH   60 mg at 04/26/19 0851  . feeding supplement (PRO-STAT SUGAR FREE 64) liquid 30 mL  30 mL Oral BID Jani Gravel, MD   30 mL at 04/25/19 2142  . fluticasone (FLONASE) 50 MCG/ACT nasal spray 2 spray  2 spray Each Nare Daily Jani Gravel, MD   2 spray at 04/26/19 0851  . guaiFENesin (MUCINEX) 12 hr tablet 600 mg  600 mg Oral BID Jani Gravel, MD   600 mg at 04/26/19 0846  . hydrALAZINE (APRESOLINE) injection 10 mg  10 mg Intravenous Q6H PRN Jani Gravel, MD      . HYDROcodone-acetaminophen [facility]) 7.5-325 MG per tablet 1 tablet  1 tablet Oral Q4H PRN Murlean Iba, MD   1  tablet at 04/26/19 0845  . insulin aspart (novoLOG) injection 0-5 Units  0-5 Units Subcutaneous QHS Jani Gravel, MD   3 Units at 04/25/19 2208  . insulin aspart (novoLOG) injection 0-9 Units  0-9 Units Subcutaneous TID WC Jani Gravel, MD   2 Units at 04/26/19 [phone]  . insulin aspart (novoLOG) injection 14 Units  14 Units Subcutaneous TID WC Wynetta Emery, Clanford L, MD   14 Units at 04/26/19 [phone]  . insulin detemir (LEVEMIR) injection 35 Units  35 Units Subcutaneous BID Murlean Iba, MD   35 Units at 04/26/19 0854  . lidocaine (LIDODERM) 5 % 1 patch  1 patch Transdermal Q24H Omar Person, NP   1 patch at 04/25/19 2142  . loratadine (CLARITIN) tablet 10 mg  10 mg Oral Daily Jani Gravel, MD   10 mg at 04/26/19 0847  . metoprolol tartrate (  LOPRESSOR) tablet 100 mg  100 mg Oral BID Jani Gravel, MD   100 mg at 04/26/19 0846  . montelukast (SINGULAIR) tablet 10 mg  10 mg Oral Daily Jani Gravel, MD   10 mg at 04/26/19 0847  . mupirocin ointment (BACTROBAN) 2 % 1 application  1 application Nasal BID Murlean Iba, MD   1 application at [phone]  . oxybutynin (DITROPAN XL) 24 hr tablet 15 mg  15 mg Oral QPM Jani Gravel, MD   15 mg at 04/25/19 1718  . pantoprazole (PROTONIX) EC tablet 40 mg  40 mg Oral BID Jani Gravel, MD   40 mg at 04/26/19 0846  . polycarbophil (FIBERCON) tablet 625 mg  625 mg Oral BID Jani Gravel, MD   625 mg at 04/26/19 0854  . polyethylene glycol (MIRALAX / GLYCOLAX) packet 17 g  17 g Oral Daily Jani Gravel, MD   17 g at 04/26/19 0850  . predniSONE (DELTASONE) tablet 20 mg  20 mg Oral Q breakfast Jani Gravel, MD   20 mg at 04/26/19 0855  . senna (SENOKOT) tablet 8.6 mg  1 tablet Oral QHS Jani Gravel, MD   8.6 mg at 04/25/19 2143  . sodium chloride (OCEAN) 0.65 % nasal spray 2 spray  2 spray Each Nare BID Jani Gravel, MD   2 spray at 04/26/19 0855  . sodium chloride flush (NS) 0.9 % injection 10-40 mL  10-40 mL Intracatheter Q12H Johnson, Clanford L, MD   10 mL at 04/26/19 0639  .  sodium chloride flush (NS) 0.9 % injection 10-40 mL  10-40 mL Intracatheter PRN Johnson, Clanford L, MD      . triamcinolone cream (KENALOG) 0.1 %   Topical BID Omar Person, NP         Discharge Medications: Please see discharge summary for a list of discharge medications.  Relevant Imaging Results:  Relevant Lab Results:   Additional Information SSN: [id number]  Alberteen Sam, LCSW

## 2019-04-26 NOTE — Progress Notes (Signed)
Pt says she is not wanting to home as the room is not ready and she needs more reahb, pt is on phone with sister and discussing options, she asked what the SNF was offered, Sharmon Leyden F AD in to assist and will call CM

## 2019-04-28 ENCOUNTER — Other Ambulatory Visit: Payer: Self-pay | Admitting: Endocrinology

## 2019-04-28 DIAGNOSIS — G4733 Obstructive sleep apnea (adult) (pediatric): Secondary | ICD-10-CM | POA: Diagnosis not present

## 2019-04-28 DIAGNOSIS — Z9981 Dependence on supplemental oxygen: Secondary | ICD-10-CM | POA: Diagnosis not present

## 2019-04-28 DIAGNOSIS — E875 Hyperkalemia: Secondary | ICD-10-CM | POA: Diagnosis not present

## 2019-04-28 DIAGNOSIS — J449 Chronic obstructive pulmonary disease, unspecified: Secondary | ICD-10-CM | POA: Diagnosis not present

## 2019-04-28 DIAGNOSIS — N179 Acute kidney failure, unspecified: Secondary | ICD-10-CM | POA: Diagnosis not present

## 2019-04-28 DIAGNOSIS — Z87891 Personal history of nicotine dependence: Secondary | ICD-10-CM | POA: Diagnosis not present

## 2019-04-28 DIAGNOSIS — E559 Vitamin D deficiency, unspecified: Secondary | ICD-10-CM | POA: Diagnosis not present

## 2019-04-28 DIAGNOSIS — M109 Gout, unspecified: Secondary | ICD-10-CM | POA: Diagnosis not present

## 2019-04-28 DIAGNOSIS — Z7952 Long term (current) use of systemic steroids: Secondary | ICD-10-CM | POA: Diagnosis not present

## 2019-04-28 DIAGNOSIS — R1310 Dysphagia, unspecified: Secondary | ICD-10-CM | POA: Diagnosis not present

## 2019-04-28 DIAGNOSIS — Z794 Long term (current) use of insulin: Secondary | ICD-10-CM | POA: Diagnosis not present

## 2019-04-28 DIAGNOSIS — E1140 Type 2 diabetes mellitus with diabetic neuropathy, unspecified: Secondary | ICD-10-CM | POA: Diagnosis not present

## 2019-04-28 DIAGNOSIS — K219 Gastro-esophageal reflux disease without esophagitis: Secondary | ICD-10-CM | POA: Diagnosis not present

## 2019-04-28 DIAGNOSIS — M4714 Other spondylosis with myelopathy, thoracic region: Secondary | ICD-10-CM | POA: Diagnosis not present

## 2019-04-28 DIAGNOSIS — R4182 Altered mental status, unspecified: Secondary | ICD-10-CM | POA: Diagnosis not present

## 2019-04-28 DIAGNOSIS — Z6841 Body Mass Index (BMI) 40.0 and over, adult: Secondary | ICD-10-CM | POA: Diagnosis not present

## 2019-04-28 DIAGNOSIS — F329 Major depressive disorder, single episode, unspecified: Secondary | ICD-10-CM | POA: Diagnosis not present

## 2019-04-28 DIAGNOSIS — D649 Anemia, unspecified: Secondary | ICD-10-CM | POA: Diagnosis not present

## 2019-04-28 DIAGNOSIS — G43909 Migraine, unspecified, not intractable, without status migrainosus: Secondary | ICD-10-CM | POA: Diagnosis not present

## 2019-04-28 DIAGNOSIS — E1121 Type 2 diabetes mellitus with diabetic nephropathy: Secondary | ICD-10-CM | POA: Diagnosis not present

## 2019-04-28 DIAGNOSIS — F419 Anxiety disorder, unspecified: Secondary | ICD-10-CM | POA: Diagnosis not present

## 2019-04-28 DIAGNOSIS — M150 Primary generalized (osteo)arthritis: Secondary | ICD-10-CM | POA: Diagnosis not present

## 2019-04-28 DIAGNOSIS — G8929 Other chronic pain: Secondary | ICD-10-CM | POA: Diagnosis not present

## 2019-04-28 DIAGNOSIS — I10 Essential (primary) hypertension: Secondary | ICD-10-CM | POA: Diagnosis not present

## 2019-04-28 MED ORDER — ONETOUCH VERIO W/DEVICE KIT: PACK | 0 refills | Status: AC

## 2019-04-28 MED ORDER — ONETOUCH ULTRASOFT LANCETS MISC: 12 refills | Status: DC

## 2019-04-28 MED ORDER — ONETOUCH VERIO VI STRP: ORAL_STRIP | 12 refills | Status: DC

## 2019-04-28 NOTE — Telephone Encounter (Signed)
These have been sent to the requested pharmacy.

## 2019-04-28 NOTE — Telephone Encounter (Signed)
MEDICATION: New Blood Sugar Meter, lancets, and test strips  PHARMACY:  [facility] on [address]  IS THIS A 90 DAY SUPPLY :   IS PATIENT OUT OF MEDICATION: yes  IF NOT; HOW MUCH IS LEFT: none  LAST APPOINTMENT DATE: @10 /22/2020  NEXT APPOINTMENT DATE:@12 /08/2018  DO WE HAVE YOUR PERMISSION TO LEAVE A DETAILED MESSAGE:  OTHER COMMENTS: per home health PT - patient has no meter at home - post hospital release   **Let patient know to contact pharmacy at the end of the day to make sure medication is ready. **  ** Please notify patient to allow 48-72 hours to process**  **Encourage patient to contact the pharmacy for refills or they can request refills through [facility]**

## 2019-05-02 ENCOUNTER — Telehealth: Payer: Self-pay

## 2019-05-02 DIAGNOSIS — M189 Osteoarthritis of first carpometacarpal joint, unspecified: Secondary | ICD-10-CM | POA: Diagnosis not present

## 2019-05-02 DIAGNOSIS — I517 Cardiomegaly: Secondary | ICD-10-CM | POA: Diagnosis not present

## 2019-05-02 DIAGNOSIS — M797 Fibromyalgia: Secondary | ICD-10-CM | POA: Diagnosis not present

## 2019-05-02 DIAGNOSIS — I10 Essential (primary) hypertension: Secondary | ICD-10-CM | POA: Diagnosis not present

## 2019-05-02 NOTE — Telephone Encounter (Signed)
Company: [facility]  Document: [employer] testing supplies Other records requested: None requested  All above requested information has been faxed successfully to the Company listed above. Documents and fax confirmation have been placed in the faxed file for future reference.  

## 2019-05-03 ENCOUNTER — Other Ambulatory Visit: Payer: Self-pay | Admitting: Endocrinology

## 2019-05-08 ENCOUNTER — Emergency Department (HOSPITAL_COMMUNITY): Payer: Medicare Other

## 2019-05-08 ENCOUNTER — Encounter (HOSPITAL_COMMUNITY): Payer: Self-pay

## 2019-05-08 ENCOUNTER — Inpatient Hospital Stay (HOSPITAL_COMMUNITY)
Admission: EM | Admit: 2019-05-08 | Discharge: 2019-05-11 | DRG: 091 | Disposition: A | Discharge status: Skilled Nursing Facility | Payer: Medicare Other | Attending: Internal Medicine | Admitting: Internal Medicine

## 2019-05-08 ENCOUNTER — Other Ambulatory Visit: Payer: Self-pay

## 2019-05-08 DIAGNOSIS — T481X5A Adverse effect of skeletal muscle relaxants [neuromuscular blocking agents], initial encounter: Secondary | ICD-10-CM | POA: Diagnosis present

## 2019-05-08 DIAGNOSIS — E1169 Type 2 diabetes mellitus with other specified complication: Secondary | ICD-10-CM | POA: Diagnosis present

## 2019-05-08 DIAGNOSIS — K5909 Other constipation: Secondary | ICD-10-CM | POA: Diagnosis present

## 2019-05-08 DIAGNOSIS — Z8619 Personal history of other infectious and parasitic diseases: Secondary | ICD-10-CM

## 2019-05-08 DIAGNOSIS — J309 Allergic rhinitis, unspecified: Secondary | ICD-10-CM | POA: Diagnosis present

## 2019-05-08 DIAGNOSIS — E1122 Type 2 diabetes mellitus with diabetic chronic kidney disease: Secondary | ICD-10-CM | POA: Diagnosis present

## 2019-05-08 DIAGNOSIS — N179 Acute kidney failure, unspecified: Secondary | ICD-10-CM | POA: Diagnosis present

## 2019-05-08 DIAGNOSIS — Z794 Long term (current) use of insulin: Secondary | ICD-10-CM

## 2019-05-08 DIAGNOSIS — J440 Chronic obstructive pulmonary disease with acute lower respiratory infection: Secondary | ICD-10-CM | POA: Diagnosis present

## 2019-05-08 DIAGNOSIS — A419 Sepsis, unspecified organism: Secondary | ICD-10-CM

## 2019-05-08 DIAGNOSIS — Z91048 Other nonmedicinal substance allergy status: Secondary | ICD-10-CM

## 2019-05-08 DIAGNOSIS — Z79899 Other long term (current) drug therapy: Secondary | ICD-10-CM

## 2019-05-08 DIAGNOSIS — Z20828 Contact with and (suspected) exposure to other viral communicable diseases: Secondary | ICD-10-CM | POA: Diagnosis present

## 2019-05-08 DIAGNOSIS — M4804 Spinal stenosis, thoracic region: Secondary | ICD-10-CM | POA: Diagnosis present

## 2019-05-08 DIAGNOSIS — E1110 Type 2 diabetes mellitus with ketoacidosis without coma: Secondary | ICD-10-CM | POA: Diagnosis present

## 2019-05-08 DIAGNOSIS — E669 Obesity, unspecified: Secondary | ICD-10-CM | POA: Diagnosis present

## 2019-05-08 DIAGNOSIS — R4182 Altered mental status, unspecified: Secondary | ICD-10-CM | POA: Diagnosis not present

## 2019-05-08 DIAGNOSIS — R519 Headache, unspecified: Secondary | ICD-10-CM | POA: Diagnosis present

## 2019-05-08 DIAGNOSIS — R000 Tachycardia, unspecified: Secondary | ICD-10-CM

## 2019-05-08 DIAGNOSIS — E876 Hypokalemia: Secondary | ICD-10-CM

## 2019-05-08 DIAGNOSIS — G4733 Obstructive sleep apnea (adult) (pediatric): Secondary | ICD-10-CM | POA: Diagnosis present

## 2019-05-08 DIAGNOSIS — Z751 Person awaiting admission to adequate facility elsewhere: Secondary | ICD-10-CM

## 2019-05-08 DIAGNOSIS — J189 Pneumonia, unspecified organism: Secondary | ICD-10-CM | POA: Diagnosis present

## 2019-05-08 DIAGNOSIS — Z90711 Acquired absence of uterus with remaining cervical stump: Secondary | ICD-10-CM

## 2019-05-08 DIAGNOSIS — Z87891 Personal history of nicotine dependence: Secondary | ICD-10-CM

## 2019-05-08 DIAGNOSIS — E860 Dehydration: Secondary | ICD-10-CM | POA: Diagnosis present

## 2019-05-08 DIAGNOSIS — T383X6A Underdosing of insulin and oral hypoglycemic [antidiabetic] drugs, initial encounter: Secondary | ICD-10-CM | POA: Diagnosis present

## 2019-05-08 DIAGNOSIS — M109 Gout, unspecified: Secondary | ICD-10-CM | POA: Diagnosis present

## 2019-05-08 DIAGNOSIS — R0902 Hypoxemia: Secondary | ICD-10-CM | POA: Diagnosis not present

## 2019-05-08 DIAGNOSIS — T426X5A Adverse effect of other antiepileptic and sedative-hypnotic drugs, initial encounter: Secondary | ICD-10-CM | POA: Diagnosis present

## 2019-05-08 DIAGNOSIS — E559 Vitamin D deficiency, unspecified: Secondary | ICD-10-CM | POA: Diagnosis present

## 2019-05-08 DIAGNOSIS — Z880 Allergy status to penicillin: Secondary | ICD-10-CM

## 2019-05-08 DIAGNOSIS — E1142 Type 2 diabetes mellitus with diabetic polyneuropathy: Secondary | ICD-10-CM | POA: Diagnosis present

## 2019-05-08 DIAGNOSIS — Z800 Family history of malignant neoplasm of digestive organs: Secondary | ICD-10-CM

## 2019-05-08 DIAGNOSIS — R5383 Other fatigue: Secondary | ICD-10-CM | POA: Diagnosis not present

## 2019-05-08 DIAGNOSIS — F329 Major depressive disorder, single episode, unspecified: Secondary | ICD-10-CM | POA: Diagnosis present

## 2019-05-08 DIAGNOSIS — G92 Toxic encephalopathy: Secondary | ICD-10-CM | POA: Diagnosis present

## 2019-05-08 DIAGNOSIS — T402X5A Adverse effect of other opioids, initial encounter: Secondary | ICD-10-CM | POA: Diagnosis present

## 2019-05-08 DIAGNOSIS — Z96653 Presence of artificial knee joint, bilateral: Secondary | ICD-10-CM | POA: Diagnosis present

## 2019-05-08 DIAGNOSIS — G9340 Encephalopathy, unspecified: Secondary | ICD-10-CM

## 2019-05-08 DIAGNOSIS — G43909 Migraine, unspecified, not intractable, without status migrainosus: Secondary | ICD-10-CM | POA: Diagnosis present

## 2019-05-08 DIAGNOSIS — Z6841 Body Mass Index (BMI) 40.0 and over, adult: Secondary | ICD-10-CM

## 2019-05-08 DIAGNOSIS — Z8249 Family history of ischemic heart disease and other diseases of the circulatory system: Secondary | ICD-10-CM

## 2019-05-08 DIAGNOSIS — Z86010 Personal history of colonic polyps: Secondary | ICD-10-CM

## 2019-05-08 DIAGNOSIS — I10 Essential (primary) hypertension: Secondary | ICD-10-CM | POA: Diagnosis present

## 2019-05-08 DIAGNOSIS — Z981 Arthrodesis status: Secondary | ICD-10-CM

## 2019-05-08 DIAGNOSIS — Z7952 Long term (current) use of systemic steroids: Secondary | ICD-10-CM

## 2019-05-08 DIAGNOSIS — J449 Chronic obstructive pulmonary disease, unspecified: Secondary | ICD-10-CM | POA: Diagnosis present

## 2019-05-08 DIAGNOSIS — Z8701 Personal history of pneumonia (recurrent): Secondary | ICD-10-CM

## 2019-05-08 DIAGNOSIS — N183 Chronic kidney disease, stage 3 unspecified: Secondary | ICD-10-CM | POA: Diagnosis present

## 2019-05-08 DIAGNOSIS — Z91128 Patient's intentional underdosing of medication regimen for other reason: Secondary | ICD-10-CM

## 2019-05-08 DIAGNOSIS — K219 Gastro-esophageal reflux disease without esophagitis: Secondary | ICD-10-CM | POA: Diagnosis present

## 2019-05-08 DIAGNOSIS — Z833 Family history of diabetes mellitus: Secondary | ICD-10-CM

## 2019-05-08 DIAGNOSIS — E1165 Type 2 diabetes mellitus with hyperglycemia: Secondary | ICD-10-CM | POA: Diagnosis not present

## 2019-05-08 DIAGNOSIS — R918 Other nonspecific abnormal finding of lung field: Secondary | ICD-10-CM | POA: Diagnosis not present

## 2019-05-08 DIAGNOSIS — Z8371 Family history of colonic polyps: Secondary | ICD-10-CM

## 2019-05-08 DIAGNOSIS — M6281 Muscle weakness (generalized): Secondary | ICD-10-CM | POA: Diagnosis present

## 2019-05-08 DIAGNOSIS — Z7401 Bed confinement status: Secondary | ICD-10-CM | POA: Diagnosis not present

## 2019-05-08 DIAGNOSIS — Z888 Allergy status to other drugs, medicaments and biological substances status: Secondary | ICD-10-CM

## 2019-05-08 DIAGNOSIS — Z9089 Acquired absence of other organs: Secondary | ICD-10-CM

## 2019-05-08 DIAGNOSIS — R404 Transient alteration of awareness: Secondary | ICD-10-CM | POA: Diagnosis not present

## 2019-05-08 DIAGNOSIS — R531 Weakness: Secondary | ICD-10-CM | POA: Diagnosis not present

## 2019-05-08 DIAGNOSIS — F411 Generalized anxiety disorder: Secondary | ICD-10-CM | POA: Diagnosis present

## 2019-05-08 DIAGNOSIS — R278 Other lack of coordination: Secondary | ICD-10-CM | POA: Diagnosis present

## 2019-05-08 DIAGNOSIS — M2550 Pain in unspecified joint: Secondary | ICD-10-CM | POA: Diagnosis not present

## 2019-05-08 DIAGNOSIS — Z9181 History of falling: Secondary | ICD-10-CM

## 2019-05-08 DIAGNOSIS — Z9049 Acquired absence of other specified parts of digestive tract: Secondary | ICD-10-CM

## 2019-05-08 LAB — COMPREHENSIVE METABOLIC PANEL
ALT: 29 U/L (ref 0–44)
AST: 12 U/L — ABNORMAL LOW (ref 15–41)
Albumin: 3.1 g/dL — ABNORMAL LOW (ref 3.5–5.0)
Alkaline Phosphatase: 82 U/L (ref 38–126)
Anion gap: 21 — ABNORMAL HIGH (ref 5–15)
BUN: 18 mg/dL (ref 8–23)
CO2: 19 mmol/L — ABNORMAL LOW (ref 22–32)
Calcium: 9.7 mg/dL (ref 8.9–10.3)
Chloride: 96 mmol/L — ABNORMAL LOW (ref 98–111)
Creatinine, Ser: 1.94 mg/dL — ABNORMAL HIGH (ref 0.44–1.00)
GFR calc Af Amer: 30 mL/min — ABNORMAL LOW (ref >60)
GFR calc non Af Amer: 26 mL/min — ABNORMAL LOW (ref >60)
Glucose, Bld: 273 mg/dL — ABNORMAL HIGH (ref 70–99)
Potassium: 3.3 mmol/L — ABNORMAL LOW (ref 3.5–5.1)
Sodium: 136 mmol/L (ref 135–145)
Total Bilirubin: 1.2 mg/dL (ref 0.3–1.2)
Total Protein: 7.2 g/dL (ref 6.5–8.1)

## 2019-05-08 LAB — AMMONIA: Ammonia: <9 umol/L — ABNORMAL LOW (ref 9–35)

## 2019-05-08 LAB — CBC WITH DIFFERENTIAL/PLATELET
Abs Immature Granulocytes: 0.04 10*3/uL (ref 0.00–0.07)
Basophils Absolute: 0 10*3/uL (ref 0.0–0.1)
Basophils Relative: 0 %
Eosinophils Absolute: 0.1 10*3/uL (ref 0.0–0.5)
Eosinophils Relative: 1 %
HCT: 44 % (ref 36.0–46.0)
Hemoglobin: 13.7 g/dL (ref 12.0–15.0)
Immature Granulocytes: 1 %
Lymphocytes Relative: 10 %
Lymphs Abs: 0.9 10*3/uL (ref 0.7–4.0)
MCH: 29 pg (ref 26.0–34.0)
MCHC: 31.1 g/dL (ref 30.0–36.0)
MCV: 93.2 fL (ref 80.0–100.0)
Monocytes Absolute: 0.7 10*3/uL (ref 0.1–1.0)
Monocytes Relative: 8 %
Neutro Abs: 7.1 10*3/uL (ref 1.7–7.7)
Neutrophils Relative %: 80 %
Platelets: 271 10*3/uL (ref 150–400)
RBC: 4.72 MIL/uL (ref 3.87–5.11)
RDW: 16.3 % — ABNORMAL HIGH (ref 11.5–15.5)
WBC: 8.8 10*3/uL (ref 4.0–10.5)
nRBC: 0 % (ref 0.0–0.2)

## 2019-05-08 LAB — BLOOD GAS, VENOUS
Acid-base deficit: 6.3 mmol/L — ABNORMAL HIGH (ref 0.0–2.0)
Bicarbonate: 19.7 mmol/L — ABNORMAL LOW (ref 20.0–28.0)
O2 Saturation: 37.1 %
pCO2, Ven: 43.4 mmHg — ABNORMAL LOW (ref 44.0–60.0)
pH, Ven: 7.28 (ref 7.250–7.430)

## 2019-05-08 LAB — ETHANOL: Alcohol, Ethyl (B): <10 mg/dL (ref <10)

## 2019-05-08 LAB — TROPONIN I (HIGH SENSITIVITY): Troponin I (High Sensitivity): 10 ng/L (ref <18)

## 2019-05-08 LAB — CK: Total CK: 72 U/L (ref 38–234)

## 2019-05-08 LAB — LACTIC ACID, PLASMA: Lactic Acid, Venous: 1.5 mmol/L (ref 0.5–1.9)

## 2019-05-08 LAB — SARS CORONAVIRUS 2 (TAT 6-24 HRS): SARS Coronavirus 2: NEGATIVE

## 2019-05-08 MED ORDER — ACETAMINOPHEN 650 MG RE SUPP: 650.0000 mg | Freq: Four times a day (QID) | RECTAL | Status: DC | PRN

## 2019-05-08 MED ORDER — INSULIN DETEMIR 100 UNIT/ML ~~LOC~~ SOLN
40.0000 [IU] | Freq: Two times a day (BID) | SUBCUTANEOUS | Status: DC
  Administered 2019-05-08: 40 [IU] via SUBCUTANEOUS
  Filled 2019-05-08 (×2): qty 0.4

## 2019-05-08 MED ORDER — ENOXAPARIN SODIUM 40 MG/0.4ML ~~LOC~~ SOLN: 40.0000 mg | SUBCUTANEOUS | Status: DC

## 2019-05-08 MED ORDER — INSULIN ASPART 100 UNIT/ML ~~LOC~~ SOLN
10.0000 [IU] | Freq: Once | SUBCUTANEOUS | Status: DC
  Filled 2019-05-08: qty 0.1

## 2019-05-08 MED ORDER — PANTOPRAZOLE SODIUM 40 MG PO TBEC
40.0000 mg | DELAYED_RELEASE_TABLET | Freq: Two times a day (BID) | ORAL | Status: DC
  Administered 2019-05-10 – 2019-05-11 (×3): 40 mg via ORAL
  Filled 2019-05-08 (×3): qty 1

## 2019-05-08 MED ORDER — SODIUM CHLORIDE 0.9 % IV SOLN
500.0000 mg | INTRAVENOUS | Status: DC
  Administered 2019-05-09: 500 mg via INTRAVENOUS
  Filled 2019-05-08 (×2): qty 500

## 2019-05-08 MED ORDER — SALINE SPRAY 0.65 % NA SOLN
2.0000 | Freq: Two times a day (BID) | NASAL | Status: DC
  Administered 2019-05-09 – 2019-05-11 (×4): 2 via NASAL
  Filled 2019-05-08: qty 44

## 2019-05-08 MED ORDER — SODIUM CHLORIDE 0.9 % IV SOLN
1.0000 g | INTRAVENOUS | Status: DC
  Administered 2019-05-09 – 2019-05-10 (×2): 1 g via INTRAVENOUS
  Filled 2019-05-08: qty 10
  Filled 2019-05-08 (×2): qty 1

## 2019-05-08 MED ORDER — ADULT MULTIVITAMIN W/MINERALS CH
1.0000 | ORAL_TABLET | Freq: Every day | ORAL | Status: DC
  Administered 2019-05-10: 22:00:00 1 via ORAL
  Filled 2019-05-08 (×2): qty 1

## 2019-05-08 MED ORDER — SODIUM CHLORIDE 0.9 % IV BOLUS
1000.0000 mL | Freq: Once | INTRAVENOUS | Status: AC
  Administered 2019-05-08: 22:00:00 1000 mL via INTRAVENOUS

## 2019-05-08 MED ORDER — INSULIN ASPART 100 UNIT/ML ~~LOC~~ SOLN: 0.0000 [IU] | Freq: Every day | SUBCUTANEOUS | Status: DC

## 2019-05-08 MED ORDER — ACETAMINOPHEN 325 MG PO TABS
650.0000 mg | ORAL_TABLET | Freq: Four times a day (QID) | ORAL | Status: DC | PRN
  Administered 2019-05-10 – 2019-05-11 (×4): 650 mg via ORAL
  Filled 2019-05-08 (×4): qty 2

## 2019-05-08 MED ORDER — GUAIFENESIN ER 600 MG PO TB12
600.0000 mg | ORAL_TABLET | Freq: Two times a day (BID) | ORAL | Status: DC
  Administered 2019-05-10 – 2019-05-11 (×4): 600 mg via ORAL
  Filled 2019-05-08 (×3): qty 1

## 2019-05-08 MED ORDER — MONTELUKAST SODIUM 10 MG PO TABS
10.0000 mg | ORAL_TABLET | Freq: Every day | ORAL | Status: DC
  Administered 2019-05-10 – 2019-05-11 (×2): 10 mg via ORAL
  Filled 2019-05-08 (×2): qty 1

## 2019-05-08 MED ORDER — PREDNISONE 20 MG PO TABS: 20.0000 mg | ORAL_TABLET | Freq: Every day | ORAL | Status: DC

## 2019-05-08 MED ORDER — POLYETHYLENE GLYCOL 3350 17 G PO PACK
17.0000 g | PACK | Freq: Every day | ORAL | Status: DC
  Administered 2019-05-10 – 2019-05-11 (×2): 17 g via ORAL
  Filled 2019-05-08 (×2): qty 1

## 2019-05-08 MED ORDER — BENZONATATE 100 MG PO CAPS
100.0000 mg | ORAL_CAPSULE | Freq: Every day | ORAL | Status: DC
  Administered 2019-05-10: 100 mg via ORAL
  Filled 2019-05-08: qty 1

## 2019-05-08 MED ORDER — INSULIN ASPART 100 UNIT/ML ~~LOC~~ SOLN
0.0000 [IU] | Freq: Three times a day (TID) | SUBCUTANEOUS | Status: DC
  Filled 2019-05-08: qty 0.09

## 2019-05-08 MED ORDER — DOCUSATE SODIUM 100 MG PO CAPS
100.0000 mg | ORAL_CAPSULE | Freq: Two times a day (BID) | ORAL | Status: DC
  Administered 2019-05-10 – 2019-05-11 (×3): 100 mg via ORAL
  Filled 2019-05-08 (×3): qty 1

## 2019-05-08 MED ORDER — TRIAMCINOLONE ACETONIDE 0.1 % EX CREA
1.0000 "application " | TOPICAL_CREAM | Freq: Two times a day (BID) | CUTANEOUS | Status: DC
  Administered 2019-05-09 – 2019-05-11 (×4): 1 via TOPICAL
  Filled 2019-05-08: qty 15

## 2019-05-08 MED ORDER — LORATADINE 10 MG PO TABS
10.0000 mg | ORAL_TABLET | Freq: Every day | ORAL | Status: DC
  Administered 2019-05-10 – 2019-05-11 (×2): 10 mg via ORAL
  Filled 2019-05-08 (×2): qty 1

## 2019-05-08 MED ORDER — POTASSIUM CHLORIDE CRYS ER 20 MEQ PO TBCR: 40.0000 meq | EXTENDED_RELEASE_TABLET | Freq: Once | ORAL | Status: DC

## 2019-05-08 MED ORDER — CALCIUM POLYCARBOPHIL 625 MG PO TABS
625.0000 mg | ORAL_TABLET | Freq: Two times a day (BID) | ORAL | Status: DC
  Administered 2019-05-10 – 2019-05-11 (×3): 625 mg via ORAL
  Filled 2019-05-08 (×6): qty 1

## 2019-05-08 MED ORDER — SODIUM CHLORIDE 0.9 % IV SOLN
500.0000 mg | Freq: Once | INTRAVENOUS | Status: AC
  Administered 2019-05-08: 23:00:00 500 mg via INTRAVENOUS
  Filled 2019-05-08: qty 500

## 2019-05-08 MED ORDER — OXYBUTYNIN CHLORIDE ER 15 MG PO TB24
15.0000 mg | ORAL_TABLET | Freq: Every evening | ORAL | Status: DC
  Administered 2019-05-10 – 2019-05-11 (×2): 15 mg via ORAL
  Filled 2019-05-08 (×3): qty 1

## 2019-05-08 MED ORDER — LIDOCAINE 5 % EX PTCH
1.0000 | MEDICATED_PATCH | Freq: Two times a day (BID) | CUTANEOUS | Status: DC
  Administered 2019-05-10 – 2019-05-11 (×3): 1 via TRANSDERMAL
  Filled 2019-05-08 (×6): qty 1

## 2019-05-08 MED ORDER — SODIUM CHLORIDE 0.9 % IV SOLN: INTRAVENOUS | Status: DC

## 2019-05-08 MED ORDER — SENNA 8.6 MG PO TABS
1.0000 | ORAL_TABLET | Freq: Every day | ORAL | Status: DC
  Administered 2019-05-10: 22:00:00 8.6 mg via ORAL
  Filled 2019-05-08: qty 1

## 2019-05-08 MED ORDER — ALLOPURINOL 300 MG PO TABS
300.0000 mg | ORAL_TABLET | Freq: Every day | ORAL | Status: DC
  Administered 2019-05-10 – 2019-05-11 (×2): 300 mg via ORAL
  Filled 2019-05-08 (×2): qty 1

## 2019-05-08 MED ORDER — SODIUM CHLORIDE 0.9 % IV SOLN
1.0000 g | Freq: Once | INTRAVENOUS | Status: AC
  Administered 2019-05-08: 22:00:00 1 g via INTRAVENOUS
  Filled 2019-05-08: qty 10

## 2019-05-08 MED ORDER — VITAMIN D 25 MCG (1000 UNIT) PO TABS
2000.0000 [IU] | ORAL_TABLET | Freq: Every day | ORAL | Status: DC
  Administered 2019-05-10 – 2019-05-11 (×2): 2000 [IU] via ORAL
  Filled 2019-05-08 (×2): qty 2

## 2019-05-08 MED ORDER — FLUTICASONE PROPIONATE 50 MCG/ACT NA SUSP
2.0000 | Freq: Every day | NASAL | Status: DC
  Administered 2019-05-09 – 2019-05-11 (×3): 2 via NASAL
  Filled 2019-05-08: qty 16

## 2019-05-08 MED ORDER — CYCLOSPORINE 0.05 % OP EMUL
1.0000 [drp] | Freq: Two times a day (BID) | OPHTHALMIC | Status: DC
  Administered 2019-05-09 – 2019-05-11 (×4): 1 [drp] via OPHTHALMIC
  Filled 2019-05-08 (×6): qty 30

## 2019-05-08 NOTE — ED Triage Notes (Signed)
Patient BIB EMS from home. Patient recently discharged from [facility]. Patient has home health nurse that checks on her once weekly. Per report, nurse checked on patient today and found her covered in urine/feces, with altered mental status, and her medication bottles had not been touched. Patient alert to self only. Unsure how long patient has been in this condition. Patient can tell this RN in triage her name and that she is at "[facility]" but is otherwise not oriented.   EMS: 126/60, 112 HR, 96% 2LNC (93% RA), 293 CBG, 98.1 Temporal Temp.

## 2019-05-08 NOTE — ED Notes (Signed)
Provider notified of lac value PO2 V = below reportable limits

## 2019-05-08 NOTE — ED Provider Notes (Signed)
[place] DEPT Provider Note   CSN: [id number] Arrival date & time: 05/08/19  1346     History   Chief Complaint Chief Complaint  Patient presents with  . Altered Mental Status    HPI Christy May is a 68 y.o. female.     HPI   Christy May is a pleasant 68 year old female with history of DM type II,  neuropathy/nephropathy, OSA/CPAPas well as upper airway syndrome/COPD, bipolar, thoracic spinal stenosis status post surgery, nonambulatory, admitted 04/24/2019 for AKI, encephalopathy from [facility] and discharged to home presents with concern for altered mental status, fatigue, decreased appetite, and found to be covered in feces in urine.  History is limited by patient's mental status. Reports she is here as she has not been eating as much for the last few days. Denies pain, fevers, other concerns  Per brother, Pt Went to hospital from [facility], and instead of going home (was going to be released from [facility] 11/13) they offered to send her somewhere else/another SNF, but she wanted to go home.  She was at her own home and sister Tim Lair living there temporarily.  Lily just released from hospital after having stroke, 30 days in Sept was in hospital, not in great shape to take care of [name] per brother.    Sister said about 3 days ago she just started "laughing" has been "laughing for 3 days"  Has only been back at home for 12 days or so.  Sister had told brother that she has not gotten out of bed. Brother states she is not ambulatory at baseline, but has been more fatigued and confused over last several days  Pt covered in urine and feces on EMS arrival Past Medical History:  Diagnosis Date  . Allergy   . Anemia, unspecified   . Anxiety   . Arthritis    "neck, back, hands" (09/03/2014)  . Chronic airway obstruction, not elsewhere classified    "I was told I don't have this/tests done @ [facility] 05/2014"  . Chronic back pain   .  Colon polyps   . Complication of anesthesia    "I've had recall; I probably stopped breathing at least 2 times" (09/03/2014)  . Dehydration   . Depressive disorder, not elsewhere classified   . Diabetic peripheral neuropathy ([place])   . Dysphagia 2007   historyof dysphagia with severe dysmotility by barium swallow-[name]  . Esophageal reflux   . Family history of adverse reaction to anesthesia    "my brother was told they lost him a couple times during OR"  . HCAP (healthcare-associated pneumonia) 03/25/2018  . Heart murmur   . History of hiatal hernia   . Migraine    "couple times/month right now" (09/03/2014)  . Obesity, unspecified   . OSA (obstructive sleep apnea)     CPAP  . Septic shock ([place]) 08/30/2017  . Spondylosis of unspecified site without mention of myelopathy   . Type II diabetes mellitus ([place])   . Unspecified essential hypertension   . Unspecified menopausal and postmenopausal disorder   . Unspecified vitamin D deficiency   . Upper airway cough syndrome     Patient Active Problem List   Diagnosis Date Noted  . Sepsis ([place]) 05/08/2019  . Hypokalemia 05/08/2019  . ARF (acute renal failure) ([place]) 04/24/2019  . AMS (altered mental status) 04/24/2019  . Fungal skin infection 03/25/2018  . Thoracic spondylosis with myelopathy 02/17/2018  . Spondylosis, thoracic, with myelopathy 02/17/2018  .  Dehydration 02/11/2018  . AKI (acute kidney injury) ([place]) 02/11/2018  . Hypotension 02/11/2018  . Syncope, vasovagal 02/11/2018  . Syncope 02/11/2018  . DM neuropathy, type II diabetes mellitus ([place]) 11/18/2017  . Weakness 11/18/2017  . SIRS (systemic inflammatory response syndrome) ([place]) 11/17/2017  . Diabetes mellitus type 2 in obese ([place]) 11/17/2017  . Acute lower UTI 11/17/2017  . Acute renal failure (ARF) ([place]) 08/13/2017  . Pressure injury of skin 08/13/2017  . Sepsis due to gram-negative UTI ([place]) 08/12/2017  . Upper airway cough syndrome 09/28/2016  . Acute  bronchitis 10/29/2015  . Diabetes ([place]) 08/21/2015  . Chest pain 07/14/2015  . Dyspnea on exertion 07/05/2015  . Obstructive sleep apnea 07/05/2015  . Constipation 03/05/2015  . Allergic rhinitis 03/05/2015  . Hearing loss 03/05/2015  . Vocal cord dysfunction 03/05/2015  . Pain in joint, shoulder region 09/11/2014  . Weakness generalized 09/02/2014  . Acute esophagitis 05/08/2014  . Benign neoplasm of sigmoid colon 05/08/2014  . Change in bowel habits 04/17/2014  . Fecal incontinence 04/17/2014  . Urge incontinence 04/08/2014  . Cough 12/06/2013  . Orthostasis 07/18/2013  . Hyperkalemia 07/18/2013  . Acute kidney injury ([place]) 07/18/2013  . Bilateral carpal tunnel syndrome 02/14/2013  . Gout 01/04/2013  . Failure to thrive 06/28/2012  . Gait disorder 06/28/2012  . Lower abdominal pain 12/14/2011  . Preventative health care 08/23/2011  . Cervical radiculopathy 02/11/2011  . Low back pain 02/11/2011  . VITAMIN D DEFICIENCY 11/22/2009  . ANEMIA-NOS 11/22/2009  . DEGENERATIVE JOINT DISEASE, CERVICAL SPINE 11/22/2009  . Depression 09/16/2009  . POLYNEUROPATHY 09/16/2009  . Morbid obesity ([place]) 09/09/2009  . Essential hypertension 09/09/2009  . GERD 09/09/2009  . Sleep apnea 09/09/2009    Past Surgical History:  Procedure Laterality Date  . ABDOMINAL HYSTERECTOMY     "partial"  . ANTERIOR CERVICAL DECOMP/DISCECTOMY FUSION     multiple cervical spine levels;Dr. Arnoldo Morale  . APPENDECTOMY    . BACK SURGERY    . CHOLECYSTECTOMY OPEN    . COLONOSCOPY N/A 05/08/2014   Procedure: COLONOSCOPY;  Surgeon: [name], MD;  Location: [facility] ENDOSCOPY;  Service: Endoscopy;  Laterality: N/A;  . DILATION AND CURETTAGE OF UTERUS    . ESOPHAGOGASTRODUODENOSCOPY N/A 05/08/2014   Procedure: ESOPHAGOGASTRODUODENOSCOPY (EGD);  Surgeon: [name], MD;  Location: [name];  Service: Endoscopy;  Laterality: N/A;  . EXPLORATORY LAPAROTOMY    . EYE SURGERY    . FOOT SURGERY Right X 2    "took blood out of my arms and put platelets in my feet"  . INCISION AND DRAINAGE ABSCESS     Chest  . JOINT REPLACEMENT    . REFRACTIVE SURGERY Bilateral   . THORACIC DISCECTOMY N/A 02/17/2018   Procedure: THORACIC ONE - THORACIC TWO LAMINECTOMY;  Surgeon: Newman Pies, MD;  Location: [facility];  Service: Neurosurgery;  Laterality: N/A;  THORACIC ONE - THORACIC TWO LAMINECTOMY  . TONSILLECTOMY    . TOTAL KNEE ARTHROPLASTY Left 10/2003  . TOTAL KNEE ARTHROPLASTY Right 01/2004   [name]     OB History   No obstetric history on file.      Home Medications    Prior to Admission medications   Medication Sig Start Date End Date Taking? Authorizing Provider  allopurinol (ZYLOPRIM) 300 MG tablet Take 300 mg by mouth daily.    Yes [provider]  benzonatate (TESSALON) 100 MG capsule Take 100 mg by mouth at bedtime.    Yes [provider]  Cholecalciferol (VITAMIN D-3) 1000 units CAPS Take 2,000 Units by mouth daily.    Yes [provider]  cyclobenzaprine (FLEXERIL) 10 MG tablet Take 1 tablet (10 mg total) by mouth 3 (three) times daily as needed for muscle spasms. 02/22/18  Yes Regalado, Belkys A, MD  cycloSPORINE (RESTASIS) 0.05 % ophthalmic emulsion Place 1 drop into both eyes 2 (two) times daily.    Yes [provider]  docusate sodium (COLACE) 100 MG capsule Take 1 capsule (100 mg total) by mouth 2 (two) times daily. 02/19/18  Yes Regalado, Belkys A, MD  Dulaglutide (TRULICITY) 1.5 ZO/1.0RU SOPN Inject 1.5 mg into the skin every Friday.   Yes [provider]  fluticasone (FLONASE) 50 MCG/ACT nasal spray SHAKE LIQUID AND USE 2 SPRAYS IN EACH NOSTRIL DAILY Patient taking differently: Place 2 sprays into both nostrils daily.  03/08/17  Yes Collene Gobble, MD  gabapentin (NEURONTIN) 300 MG capsule Take 1 capsule (300 mg total) by mouth 3 (three) times daily. Patient taking differently: Take 900 mg by mouth 3 (three) times daily.  04/26/19  Yes  Nita Sells, MD  guaiFENesin (MUCINEX) 600 MG 12 hr tablet Take 600 mg by mouth 2 (two) times daily.   Yes [provider]  hydrochlorothiazide (MICROZIDE) 12.5 MG capsule Take 12.5 mg by mouth daily.   Yes [provider]  HYDROcodone-acetaminophen (NORCO/VICODIN) 5-325 MG tablet Take 2 tablets by mouth every 6 (six) hours as needed (pain). 04/26/19  Yes Nita Sells, MD  insulin aspart (NOVOLOG) 100 UNIT/ML injection Inject 12 Units into the skin 3 (three) times daily with meals.    Yes [provider]  insulin detemir (LEVEMIR) 100 UNIT/ML injection Inject 40 Units into the skin 2 (two) times daily.   Yes [provider]  irbesartan (AVAPRO) 300 MG tablet Take 300 mg by mouth daily.   Yes [provider]  Lidocaine (ASPERCREME LIDOCAINE) 4 % PTCH Place 1 patch onto the skin 2 (two) times daily.   Yes [provider]  loratadine (CLARITIN) 10 MG tablet Take 10 mg by mouth daily.    Yes [provider]  metoprolol tartrate (LOPRESSOR) 100 MG tablet Take 100 mg by mouth 2 (two) times daily.    Yes [provider]  montelukast (SINGULAIR) 10 MG tablet TAKE 1 TABLET(10 MG) BY MOUTH DAILY Patient taking differently: Take 10 mg by mouth daily.  04/03/16  Yes Magdalen Spatz, NP  Multiple Vitamins-Minerals (MULTIVITAMIN WITH MINERALS) tablet Take 1 tablet by mouth at bedtime.    Yes [provider]  oxybutynin (DITROPAN XL) 15 MG 24 hr tablet Take 15 mg by mouth every evening.  12/02/18  Yes [provider]  pantoprazole (PROTONIX) 40 MG tablet TAKE 1 TABLET(40 MG) BY MOUTH TWICE DAILY Patient taking differently: Take 40 mg by mouth 2 (two) times daily.  03/02/17  Yes Collene Gobble, MD  polycarbophil (FIBERCON) 625 MG tablet Take 625 mg by mouth 2 (two) times daily.    Yes [provider]  polyethylene glycol (MIRALAX / GLYCOLAX) packet Take 17 g by mouth 2 (two) times daily. Patient taking  differently: Take 17 g by mouth daily. Min with 4-8 oz beverage and drink 02/22/18  Yes Regalado, Belkys A, MD  predniSONE (DELTASONE) 20 MG tablet Take 20 mg by mouth daily with breakfast.    Yes [provider]  senna (SENOKOT) 8.6 MG TABS tablet Take 1 tablet (8.6 mg total) by mouth daily. Patient taking differently: Take  1 tablet by mouth at bedtime.  02/23/18  Yes Regalado, Belkys A, MD  sodium chloride (OCEAN) 0.65 % SOLN nasal spray Place 2 sprays into both nostrils 2 (two) times daily.   Yes [provider]  triamcinolone cream (KENALOG) 0.1 % Apply 1 application topically See admin instructions. Apply 2 grams topically to bilateral forearms twice daily   Yes [provider]  Amino Acids-Protein Hydrolys (FEEDING SUPPLEMENT, PRO-STAT SUGAR FREE 64,) LIQD Take 30 mLs by mouth 2 (two) times daily. Patient not taking: Reported on 05/08/2019 04/26/19   Nita Sells, MD  Blood Glucose Monitoring Suppl [facility] VERIO) w/Device KIT Use to check blood sugar 3 times a day 04/28/19   Renato Shin, MD  glucose blood [facility] VERIO) test strip 1 each by Other route 2 (two) times daily. E11.9 05/03/19   Renato Shin, MD  Insulin Syringe-Needle U-100 (INSULIN SYRINGE 1CC/31GX5/16") 31G X 5/16" 1 ML MISC USE TO INJECT INSULIN TWICE DAILY 05/11/17   Renato Shin, MD  Lancets [facility] DELICA PLUS [id number]) [place] 1 each by Other route 2 (two) times daily. E11.9 05/03/19   Renato Shin, MD  OXYGEN Inhale 2 L/min into the lungs.    [provider]  PRESCRIPTION MEDICATION Inhale into the lungs at bedtime. CPAP    [provider]    Family History Family History  Problem Relation Age of Onset  . Esophageal cancer Brother   . Esophageal cancer Sister        ?  . Colon polyps Brother   . Pancreatic cancer Sister        ?  . Diabetes Sister   . Diabetes Other        Aunt and Uncle  . Heart disease Maternal Grandfather     Social History Social  History   Tobacco Use  . Smoking status: Former Smoker    Packs/day: 1.00    Years: 20.00    Pack years: 20.00    Types: Cigarettes    Quit date: 06/22/1986    Years since quitting: 32.9  . Smokeless tobacco: Never Used  Substance Use Topics  . Alcohol use: Yes  . Drug use: No     Allergies   Ace inhibitors, Penicillins, and Adhesive [tape]   Review of Systems Review of Systems  Unable to perform ROS: Mental status change  Constitutional: Positive for activity change, appetite change and fatigue.  Respiratory: Negative for cough and shortness of breath.   Cardiovascular: Negative for chest pain.  Gastrointestinal: Negative for abdominal pain, nausea and vomiting.  Genitourinary: Negative for dysuria.  Skin: Negative for rash.  Neurological: Negative for headaches.     Physical Exam Updated Vital Signs BP 121/67   Pulse (!) 115   Temp 97.8 F (36.6 C)   Resp 16   Ht '5\' 4"'  (1.626 m)   Wt 125.7 kg   SpO2 100%   BMI 47.57 kg/m   Physical Exam Vitals signs and nursing note reviewed.  Constitutional:      General: She is not in acute distress.    Appearance: She is well-developed. She is ill-appearing. She is not diaphoretic.  HENT:     Head: Normocephalic and atraumatic.  Eyes:     Conjunctiva/sclera: Conjunctivae normal.  Neck:     Musculoskeletal: Normal range of motion.  Cardiovascular:     Rate and Rhythm: Normal rate and regular rhythm.     Heart sounds: Normal heart sounds. No murmur. No friction rub. No gallop.  Pulmonary:     Effort: Pulmonary effort is normal. No respiratory distress.     Breath sounds: Normal breath sounds. No wheezing or rales.  Abdominal:     General: There is no distension.     Palpations: Abdomen is soft.     Tenderness: There is no abdominal tenderness. There is no guarding.  Musculoskeletal:        General: No tenderness.  Skin:    General: Skin is warm and dry.     Findings: No erythema or rash.  Neurological:      Mental Status: She is alert and oriented to person, place, and time.     Cranial Nerves: No dysarthria or facial asymmetry.     Sensory: Sensation is intact.     Motor: Weakness present.     Comments: Generalized weakness, difficulty lifting legs bilateraly (brother reports pt nonambulatory, LE weakness baseline) normal grip strength, no pronator drift  Oriented to self, reports [place], Monday but unable to state month (just repeats Monday) Slow to answer questions and only intermittently answers      ED Treatments / Results  Labs (all labs ordered are listed, but only abnormal results are displayed) Labs Reviewed  CBC WITH DIFFERENTIAL/PLATELET - Abnormal; Notable for the following components:      Result Value   RDW 16.3 (*)    All other components within normal limits  COMPREHENSIVE METABOLIC PANEL - Abnormal; Notable for the following components:   Potassium 3.3 (*)    Chloride 96 (*)    CO2 19 (*)    Glucose, Bld 273 (*)    Creatinine, Ser 1.94 (*)    Albumin 3.1 (*)    AST 12 (*)    GFR calc non Af Amer 26 (*)    GFR calc Af Amer 30 (*)    Anion gap 21 (*)    All other components within normal limits  BLOOD GAS, VENOUS - Abnormal; Notable for the following components:   pCO2, Ven 43.4 (*)    Bicarbonate 19.7 (*)    Acid-base deficit 6.3 (*)    All other components within normal limits  AMMONIA - Abnormal; Notable for the following components:   Ammonia <9 (*)    All other components within normal limits  BETA-HYDROXYBUTYRIC ACID - Abnormal; Notable for the following components:   Beta-Hydroxybutyric Acid 4.77 (*)    All other components within normal limits  URINALYSIS, ROUTINE W REFLEX MICROSCOPIC - Abnormal; Notable for the following components:   Color, Urine YELLOW (*)    APPearance HAZY (*)    Glucose, UA >=500 (*)    Hgb urine dipstick SMALL (*)    Ketones, ur 20 (*)    Protein, ur 30 (*)    Bacteria, UA MANY (*)    All other components within normal  limits  BASIC METABOLIC PANEL - Abnormal; Notable for the following components:   Sodium 134 (*)    Potassium 5.4 (*)    CO2 17 (*)    Glucose, Bld 231 (*)    Creatinine, Ser 2.03 (*)    GFR calc non Af Amer 24 (*)    GFR calc Af Amer 28 (*)    Anion gap 19 (*)    All other components within normal limits  GLUCOSE, CAPILLARY - Abnormal; Notable for the following components:   Glucose-Capillary 188 (*)    All other components within normal limits  GLUCOSE, CAPILLARY - Abnormal; Notable for the following components:  Glucose-Capillary 196 (*)    All other components within normal limits  GLUCOSE, CAPILLARY - Abnormal; Notable for the following components:   Glucose-Capillary 205 (*)    All other components within normal limits  CULTURE, BLOOD (ROUTINE X 2)  CULTURE, BLOOD (ROUTINE X 2)  SARS CORONAVIRUS 2 (TAT 6-24 HRS)  URINE CULTURE  LACTIC ACID, PLASMA  RAPID URINE DRUG SCREEN, HOSP PERFORMED  ETHANOL  CK  MAGNESIUM  TSH  BASIC METABOLIC PANEL  BASIC METABOLIC PANEL  TROPONIN I (HIGH SENSITIVITY)  TROPONIN I (HIGH SENSITIVITY)    EKG EKG Interpretation  Date/Time:  Monday May 08 2019 15:47:50 EST Ventricular Rate:  127 PR Interval:    QRS Duration: 85 QT Interval:  347 QTC Calculation: 487 R Axis:   14 Text Interpretation: Sinus tachycardia Ventricular premature complex Probable anterior infarct, age indeterminate Abnormal T, consider ischemia, inferior leads Nonspecific ST and T wave abnormality Anterolateral leads When compared with ECG of 04/24/2019, Nonspecific ST and T wave abnormality is now present Confirmed by Delora Fuel ([id number]) on 05/09/2019 1:59:06 AM   Radiology Ct Head Wo Contrast  Result Date: 05/08/2019 CLINICAL DATA:  Altered mental status EXAM: CT HEAD WITHOUT CONTRAST TECHNIQUE: Contiguous axial images were obtained from the base of the skull through the vertex without intravenous contrast. COMPARISON:  CT brain 04/24/2019 FINDINGS: Brain:  Mild motion degradation. No acute territorial infarction, hemorrhage, or intracranial mass. Mild small vessel ischemic changes of the white matter. Mild atrophy. Stable ventricle size Vascular: No hyperdense vessels. Scattered calcifications at the carotid siphons. Skull: Normal. Negative for fracture or focal lesion. Sinuses/Orbits: No acute finding. Other: None IMPRESSION: 1. Mild motion degraded study. 2. No CT evidence for acute intracranial abnormality. Mild atrophy and small vessel ischemic changes of the white matter. Electronically Signed   By: Donavan Foil M.D.   On: 05/08/2019 17:08   Dg Chest Portable 1 View  Result Date: 05/08/2019 CLINICAL DATA:  Altered mental status EXAM: PORTABLE CHEST 1 VIEW COMPARISON:  Radiograph 04/24/2019 FINDINGS: Lung volumes are persistently low with streaky opacities in the bases favoring atelectasis though underlying infection could have a similar appearance. No pneumothorax. No effusion. Stable cardiomediastinal contours with mild cardiomegaly. Cervical fixation hardware is partially visualized. No acute osseous or soft tissue abnormality. IMPRESSION: Persistently low lung volumes with streaky opacities in the bases, favoring atelectasis though underlying infection could have a similar appearance. Stable cardiomegaly. Electronically Signed   By: Lovena Le M.D.   On: 05/08/2019 19:10    Procedures Procedures (including critical care time)  Medications Ordered in ED Medications  cefTRIAXone (ROCEPHIN) 1 g in sodium chloride 0.9 % 100 mL IVPB (has no administration in time range)  azithromycin (ZITHROMAX) 500 mg in sodium chloride 0.9 % 250 mL IVPB (has no administration in time range)  allopurinol (ZYLOPRIM) tablet 300 mg (has no administration in time range)  predniSONE (DELTASONE) tablet 20 mg (has no administration in time range)  docusate sodium (COLACE) capsule 100 mg (has no administration in time range)  pantoprazole (PROTONIX) EC tablet 40 mg  (has no administration in time range)  polycarbophil (FIBERCON) tablet 625 mg (has no administration in time range)  polyethylene glycol (MIRALAX / GLYCOLAX) packet 17 g (has no administration in time range)  senna (SENOKOT) tablet 8.6 mg (has no administration in time range)  oxybutynin (DITROPAN XL) 24 hr tablet 15 mg (has no administration in time range)  cholecalciferol (VITAMIN D3) tablet 2,000 Units (has no administration in time range)  multivitamin with minerals tablet 1 tablet (has no administration in time range)  fluticasone (FLONASE) 50 MCG/ACT nasal spray 2 spray (has no administration in time range)  guaiFENesin (MUCINEX) 12 hr tablet 600 mg (has no administration in time range)  benzonatate (TESSALON) capsule 100 mg (has no administration in time range)  loratadine (CLARITIN) tablet 10 mg (has no administration in time range)  montelukast (SINGULAIR) tablet 10 mg (has no administration in time range)  sodium chloride (OCEAN) 0.65 % nasal spray 2 spray (has no administration in time range)  cycloSPORINE (RESTASIS) 0.05 % ophthalmic emulsion 1 drop (has no administration in time range)  lidocaine (LIDODERM) 5 % 1 patch (has no administration in time range)  triamcinolone cream (KENALOG) 0.1 % 1 application (has no administration in time range)  acetaminophen (TYLENOL) tablet 650 mg (has no administration in time range)    Or  acetaminophen (TYLENOL) suppository 650 mg (has no administration in time range)  potassium chloride SA (KLOR-CON) CR tablet 40 mEq (has no administration in time range)  enoxaparin (LOVENOX) injection 60 mg (has no administration in time range)  0.9 %  sodium chloride infusion ( Intravenous New Bag/Given 05/09/19 0913)  dextrose 5 %-0.45 % sodium chloride infusion ( Intravenous New Bag/Given 05/09/19 0917)  insulin regular, human (MYXREDLIN) 100 units/ 100 mL infusion (has no administration in time range)  sodium chloride flush (NS) 0.9 % injection 10-40 mL  (has no administration in time range)  cefTRIAXone (ROCEPHIN) 1 g in sodium chloride 0.9 % 100 mL IVPB (0 g Intravenous Stopped 05/08/19 2309)  azithromycin (ZITHROMAX) 500 mg in sodium chloride 0.9 % 250 mL IVPB (0 mg Intravenous Stopped 05/09/19 0210)  sodium chloride 0.9 % bolus 1,000 mL (0 mLs Intravenous Stopped 05/08/19 2359)     Initial Impression / Assessment and Plan / ED Course  I have reviewed the triage vital signs and the nursing notes.  Pertinent labs & imaging results that were available during my care of the patient were reviewed by me and considered in my medical decision making (see chart for details).        Christy May is a pleasant 68 year old female with history of DM type II,  neuropathy/nephropathy, OSA/CPAPas well as upper airway syndrome/COPD, bipolar, thoracic spinal stenosis status post surgery, nonambulatory, admitted 04/24/2019 for AKI, encephalopathy from [facility] and discharged to home presents with concern for altered mental status, fatigue, decreased appetite, and found to be covered in feces in urine.  Temperature 99.8 in ED, tachycardia present, history limited.    Pt with encephalopathy, unclear etiology at this time, currently awaiting UA.  CXR with possible pneumonia and pt given rocephin/azithromycin. No signs of intracranial hemorrhage, hepatic encephalopathy, significant electrolyte abnormality, respiratory acidosis as etiology of symptoms. Does have mild AG metabolic acidosis, very mild hyperglycemia with UA pending. Lactate norma. IV fluids ordered. Given hx of decreased po intake this may be contributing to tachycardia and possible starvation ketoacidosis.  Pt unable to care for herself and sister not capable of caring for her at home.  Will admit for further evaluation and care for her encephalopathy, UA pending.   Final Clinical Impressions(s) / ED Diagnoses   Final diagnoses:  Fatigue, unspecified type  Dehydration  Tachycardia   Encephalopathy    ED Discharge Orders    None       Gareth Morgan, MD 05/09/19 1328

## 2019-05-08 NOTE — ED Notes (Signed)
Pure wick has been placed. Suction set to 28mmhg. Pt was given bed bath upon arrival. Covered in stool and urine from head to toe.

## 2019-05-08 NOTE — H&P (Addendum)
History and Physical    Christy May BWL:[id number] DOB: 1951/01/10 DOA: 05/08/2019  PCP: Lucianne Lei, MD Patient coming from: Home  Chief Complaint: Altered mental status  HPI: Christy May is a 68 y.o. 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  Per triage note, patient was recently discharged from [facility].  She has a home health nurse that checks on her once weekly.  Per report, nurse checked on patient today and found her covered in urine/feces with altered mental status.  Her medication bottles have not been touched.  Unclear how long she has been in this condition.  No family available at this time.  Per ED provider's conversation with the patient's family, patient went home from [facility].  She was there by herself and and her sister living there temporarily.  Her sister was just released from the hospital after having a stroke and is not in great shape to take care of the patient.  Family reported that for the past 3 days patient has been laughing a lot.  She has only been back home for 12 days or so.  Patient is currently confused.  She is oriented to person and place only.  Not able to tell me why she is here.  No additional history could be obtained from her.  ED Course: Afebrile.  Tachycardic and slightly tachypneic.  Blood pressure stable.  Not hypoxic.  VBG with pH 7.28.  No leukocytosis.  Lactic acid normal.  Potassium 3.3.  Bicarb 19, anion gap 21.  Blood glucose 273.  BUN 18, creatinine 1.9.  Baseline creatinine 1.0-1.1.  LFTs normal.  High-sensitivity troponin negative.  Blood ethanol level <10.  Ammonia level <9.  CK  normal.  SARS-CoV-2 test pending.  Blood culture x2 pending.  UA and urine culture pending.  UDS pending.  Beta hydroxybutyrate level pending.  Chest x-ray showing streaky opacities in the bases.  Head CT negative for acute finding.  Patient received ceftriaxone and azithromycin.  1 L normal saline bolus ordered.  Review of Systems:  All systems reviewed and apart from history of presenting illness, are negative.  Past Medical History:  Diagnosis Date  . Allergy   . Anemia, unspecified   . Anxiety   . Arthritis    "neck, back, hands" (09/03/2014)  . Chronic airway obstruction, not elsewhere classified    "I was told I don't have this/tests done @ [facility] 05/2014"  . Chronic back pain   . Colon polyps   . Complication of anesthesia    "I've had recall; I probably stopped breathing at least 2 times" (09/03/2014)  . Dehydration   . Depressive disorder, not elsewhere classified   . Diabetic peripheral neuropathy ([place])   . Dysphagia 2007   historyof dysphagia with severe dysmotility by barium swallow-[name]  . Esophageal reflux   . Family history of adverse reaction to anesthesia    "my brother was told they lost him a couple times during OR"  . HCAP (healthcare-associated pneumonia) 03/25/2018  . Heart murmur   . History of hiatal hernia   . Migraine    "couple times/month right now" (09/03/2014)  . Obesity, unspecified   . OSA (obstructive sleep apnea)  CPAP  . Septic shock ([place]) 08/30/2017  . Spondylosis of unspecified site without mention of myelopathy   . Type II diabetes mellitus ([place])   . Unspecified essential hypertension   . Unspecified menopausal and postmenopausal disorder   . Unspecified vitamin D deficiency   . Upper airway cough syndrome     Past Surgical History:  Procedure Laterality Date  . ABDOMINAL HYSTERECTOMY     "partial"  . ANTERIOR CERVICAL DECOMP/DISCECTOMY FUSION     multiple cervical spine levels;Dr. Arnoldo Morale  . APPENDECTOMY    . BACK SURGERY     . CHOLECYSTECTOMY OPEN    . COLONOSCOPY N/A 05/08/2014   Procedure: COLONOSCOPY;  Surgeon: [name], MD;  Location: [facility] ENDOSCOPY;  Service: Endoscopy;  Laterality: N/A;  . DILATION AND CURETTAGE OF UTERUS    . ESOPHAGOGASTRODUODENOSCOPY N/A 05/08/2014   Procedure: ESOPHAGOGASTRODUODENOSCOPY (EGD);  Surgeon: [name], MD;  Location: [name];  Service: Endoscopy;  Laterality: N/A;  . EXPLORATORY LAPAROTOMY    . EYE SURGERY    . FOOT SURGERY Right X 2   "took blood out of my arms and put platelets in my feet"  . INCISION AND DRAINAGE ABSCESS     Chest  . JOINT REPLACEMENT    . REFRACTIVE SURGERY Bilateral   . THORACIC DISCECTOMY N/A 02/17/2018   Procedure: THORACIC ONE - THORACIC TWO LAMINECTOMY;  Surgeon: Newman Pies, MD;  Location: [facility];  Service: Neurosurgery;  Laterality: N/A;  THORACIC ONE - THORACIC TWO LAMINECTOMY  . TONSILLECTOMY    . TOTAL KNEE ARTHROPLASTY Left 10/2003  . TOTAL KNEE ARTHROPLASTY Right 01/2004   [name]     reports that she quit smoking about 32 years ago. Her smoking use included cigarettes. She has a 20.00 pack-year smoking history. She has never used smokeless tobacco. She reports current alcohol use. She reports that she does not use drugs.  Allergies  Allergen Reactions  . Ace Inhibitors Anaphylaxis and Cough    Tolerates Irbesartan (home med)  . Penicillins Hives and Other (See Comments)    Tolerated ceftriaxone in 2019  Has patient had a PCN reaction causing immediate rash, facial/tongue/throat swelling, SOB or lightheadedness with hypotension: Yes Has patient had a PCN reaction causing severe rash involving mucus membranes or skin necrosis:  No Has patient had a PCN reaction that required hospitalization: No Has patient had a PCN reaction occurring within the last 10 years: No If all of the above answers are "NO", then may proceed with Cephalosporin use.  . Adhesive [Tape] Hives and Rash    Family History  Problem Relation  Age of Onset  . Esophageal cancer Brother   . Esophageal cancer Sister        ?  . Colon polyps Brother   . Pancreatic cancer Sister        ?  . Diabetes Sister   . Diabetes Other        Aunt and Uncle  . Heart disease Maternal Grandfather     Prior to Admission medications   Medication Sig Start Date End Date Taking? Authorizing Provider  allopurinol (ZYLOPRIM) 300 MG tablet Take 300 mg by mouth daily.    Yes [provider]  benzonatate (TESSALON) 100 MG capsule Take 100 mg by mouth at bedtime.    Yes [provider]  Cholecalciferol (VITAMIN D-3) 1000 units CAPS Take 2,000 Units by mouth daily.    Yes [provider]  cyclobenzaprine (FLEXERIL) 10 MG tablet Take 1  tablet (10 mg total) by mouth 3 (three) times daily as needed for muscle spasms. 02/22/18  Yes Regalado, Belkys A, MD  cycloSPORINE (RESTASIS) 0.05 % ophthalmic emulsion Place 1 drop into both eyes 2 (two) times daily.    Yes [provider]  docusate sodium (COLACE) 100 MG capsule Take 1 capsule (100 mg total) by mouth 2 (two) times daily. 02/19/18  Yes Regalado, Belkys A, MD  Dulaglutide (TRULICITY) 1.5 TI/4.5YK SOPN Inject 1.5 mg into the skin every Friday.   Yes [provider]  fluticasone (FLONASE) 50 MCG/ACT nasal spray SHAKE LIQUID AND USE 2 SPRAYS IN EACH NOSTRIL DAILY Patient taking differently: Place 2 sprays into both nostrils daily.  03/08/17  Yes Collene Gobble, MD  gabapentin (NEURONTIN) 300 MG capsule Take 1 capsule (300 mg total) by mouth 3 (three) times daily. Patient taking differently: Take 900 mg by mouth 3 (three) times daily.  04/26/19  Yes Nita Sells, MD  guaiFENesin (MUCINEX) 600 MG 12 hr tablet Take 600 mg by mouth 2 (two) times daily.   Yes [provider]  hydrochlorothiazide (MICROZIDE) 12.5 MG capsule Take 12.5 mg by mouth daily.   Yes [provider]  HYDROcodone-acetaminophen (NORCO/VICODIN) 5-325 MG tablet Take 2 tablets by  mouth every 6 (six) hours as needed (pain). 04/26/19  Yes Nita Sells, MD  insulin aspart (NOVOLOG) 100 UNIT/ML injection Inject 12 Units into the skin 3 (three) times daily with meals.    Yes [provider]  insulin detemir (LEVEMIR) 100 UNIT/ML injection Inject 40 Units into the skin 2 (two) times daily.   Yes [provider]  irbesartan (AVAPRO) 300 MG tablet Take 300 mg by mouth daily.   Yes [provider]  Lidocaine (ASPERCREME LIDOCAINE) 4 % PTCH Place 1 patch onto the skin 2 (two) times daily.   Yes [provider]  loratadine (CLARITIN) 10 MG tablet Take 10 mg by mouth daily.    Yes [provider]  metoprolol tartrate (LOPRESSOR) 100 MG tablet Take 100 mg by mouth 2 (two) times daily.    Yes [provider]  montelukast (SINGULAIR) 10 MG tablet TAKE 1 TABLET(10 MG) BY MOUTH DAILY Patient taking differently: Take 10 mg by mouth daily.  04/03/16  Yes Magdalen Spatz, NP  Multiple Vitamins-Minerals (MULTIVITAMIN WITH MINERALS) tablet Take 1 tablet by mouth at bedtime.    Yes [provider]  oxybutynin (DITROPAN XL) 15 MG 24 hr tablet Take 15 mg by mouth every evening.  12/02/18  Yes [provider]  pantoprazole (PROTONIX) 40 MG tablet TAKE 1 TABLET(40 MG) BY MOUTH TWICE DAILY Patient taking differently: Take 40 mg by mouth 2 (two) times daily.  03/02/17  Yes Collene Gobble, MD  polycarbophil (FIBERCON) 625 MG tablet Take 625 mg by mouth 2 (two) times daily.    Yes [provider]  polyethylene glycol (MIRALAX / GLYCOLAX) packet Take 17 g by mouth 2 (two) times daily. Patient taking differently: Take 17 g by mouth daily. Min with 4-8 oz beverage and drink 02/22/18  Yes Regalado, Belkys A, MD  predniSONE (DELTASONE) 20 MG tablet Take 20 mg by mouth daily with breakfast.    Yes [provider]  senna (SENOKOT) 8.6 MG TABS tablet Take 1 tablet (8.6 mg total) by mouth daily. Patient taking  differently: Take 1 tablet by mouth at bedtime.  02/23/18  Yes Regalado, Belkys A, MD  sodium chloride (OCEAN) 0.65 % SOLN nasal spray Place 2 sprays into  both nostrils 2 (two) times daily.   Yes [provider]  triamcinolone cream (KENALOG) 0.1 % Apply 1 application topically See admin instructions. Apply 2 grams topically to bilateral forearms twice daily   Yes [provider]  Amino Acids-Protein Hydrolys (FEEDING SUPPLEMENT, PRO-STAT SUGAR FREE 64,) LIQD Take 30 mLs by mouth 2 (two) times daily. Patient not taking: Reported on 05/08/2019 04/26/19   Nita Sells, MD  Blood Glucose Monitoring Suppl [facility] VERIO) w/Device KIT Use to check blood sugar 3 times a day 04/28/19   Renato Shin, MD  glucose blood [facility] VERIO) test strip 1 each by Other route 2 (two) times daily. E11.9 05/03/19   Renato Shin, MD  Insulin Syringe-Needle U-100 (INSULIN SYRINGE 1CC/31GX5/16") 31G X 5/16" 1 ML MISC USE TO INJECT INSULIN TWICE DAILY 05/11/17   Renato Shin, MD  Lancets [facility] DELICA PLUS [id number]) [place] 1 each by Other route 2 (two) times daily. E11.9 05/03/19   Renato Shin, MD  OXYGEN Inhale 2 L/min into the lungs.    [provider]  PRESCRIPTION MEDICATION Inhale into the lungs at bedtime. CPAP    [provider]    Physical Exam: Vitals:   05/08/19 1600 05/08/19 1730 05/08/19 1828 05/08/19 2227  BP: (!) 103/56 (!) 103/56 108/62 (!) 176/118  Pulse: (!) 118 (!) 118 (!) 102 (!) 117  Resp: (!) 23 (!) _0 Temp:      TempSrc:      SpO2: 97% 97% 96% 92%    Physical Exam  Constitutional: She appears well-developed and well-nourished. No distress.  HENT:  Head: Normocephalic.  Eyes: Right eye exhibits no discharge. Left eye exhibits no discharge.  Neck: Neck supple.  No nuchal rigidity  Cardiovascular: Regular rhythm and intact distal pulses.  Slightly tachycardic  Pulmonary/Chest: Effort normal. She has no wheezes.  Anterior lung fields  clear to auscultation  Abdominal: Soft. Bowel sounds are normal. She exhibits no distension. There is no abdominal tenderness. There is no guarding.  Musculoskeletal:        General: No edema.  Neurological:  Awake and alert Oriented to person and place only Moving all extremities spontaneously, no focal weakness  Skin: Skin is warm and dry. She is not diaphoretic.     Labs on Admission: I have personally reviewed following labs and imaging studies  CBC: Recent Labs  Lab 05/08/19 1624  WBC 8.8  NEUTROABS 7.1  HGB 13.7  HCT 44.0  MCV 93.2  PLT 026   Basic Metabolic Panel: Recent Labs  Lab 05/08/19 1624  NA 136  K 3.3*  CL 96*  CO2 19*  GLUCOSE 273*  BUN 18  CREATININE 1.94*  CALCIUM 9.7   GFR: Estimated Creatinine Clearance: 36.8 mL/min (A) (by C-G formula based on SCr of 1.94 mg/dL (H)). Liver Function Tests: Recent Labs  Lab 05/08/19 1624  AST 12*  ALT 29  ALKPHOS 82  BILITOT 1.2  PROT 7.2  ALBUMIN 3.1*   No results for input(s): LIPASE, AMYLASE in the last 168 hours. Recent Labs  Lab 05/08/19 1624  AMMONIA <9*   Coagulation Profile: No results for input(s): INR, PROTIME in the last 168 hours. Cardiac Enzymes: Recent Labs  Lab 05/08/19 1654  CKTOTAL 72   BNP (last 3 results) No results for input(s): PROBNP in the last 8760 hours. HbA1C: No results for input(s): HGBA1C in the last 72 hours. CBG: No results for input(s): GLUCAP in the last 168 hours. Lipid Profile: No results for  input(s): CHOL, HDL, LDLCALC, TRIG, CHOLHDL, LDLDIRECT in the last 72 hours. Thyroid Function Tests: No results for input(s): TSH, T4TOTAL, FREET4, T3FREE, THYROIDAB in the last 72 hours. Anemia Panel: No results for input(s): VITAMINB12, FOLATE, FERRITIN, TIBC, IRON, RETICCTPCT in the last 72 hours. Urine analysis:    Component Value Date/Time   COLORURINE YELLOW 04/24/2019 0310   APPEARANCEUR CLEAR 04/24/2019 0310   LABSPEC 1.014 04/24/2019 0310   PHURINE  5.0 04/24/2019 0310   GLUCOSEU 50 (A) 04/24/2019 0310   GLUCOSEU NEGATIVE 12/14/2011 1707   HGBUR NEGATIVE 04/24/2019 0310   BILIRUBINUR NEGATIVE 04/24/2019 0310   BILIRUBINUR n 11/16/2014 1515   KETONESUR NEGATIVE 04/24/2019 0310   PROTEINUR NEGATIVE 04/24/2019 0310   UROBILINOGEN 0.2 02/27/2015 2226   NITRITE NEGATIVE 04/24/2019 0310   LEUKOCYTESUR NEGATIVE 04/24/2019 0310    Radiological Exams on Admission: Ct Head Wo Contrast  Result Date: 05/08/2019 CLINICAL DATA:  Altered mental status EXAM: CT HEAD WITHOUT CONTRAST TECHNIQUE: Contiguous axial images were obtained from the base of the skull through the vertex without intravenous contrast. COMPARISON:  CT brain 04/24/2019 FINDINGS: Brain: Mild motion degradation. No acute territorial infarction, hemorrhage, or intracranial mass. Mild small vessel ischemic changes of the white matter. Mild atrophy. Stable ventricle size Vascular: No hyperdense vessels. Scattered calcifications at the carotid siphons. Skull: Normal. Negative for fracture or focal lesion. Sinuses/Orbits: No acute finding. Other: None IMPRESSION: 1. Mild motion degraded study. 2. No CT evidence for acute intracranial abnormality. Mild atrophy and small vessel ischemic changes of the white matter. Electronically Signed   By: Donavan Foil M.D.   On: 05/08/2019 17:08   Dg Chest Portable 1 View  Result Date: 05/08/2019 CLINICAL DATA:  Altered mental status EXAM: PORTABLE CHEST 1 VIEW COMPARISON:  Radiograph 04/24/2019 FINDINGS: Lung volumes are persistently low with streaky opacities in the bases favoring atelectasis though underlying infection could have a similar appearance. No pneumothorax. No effusion. Stable cardiomediastinal contours with mild cardiomegaly. Cervical fixation hardware is partially visualized. No acute osseous or soft tissue abnormality. IMPRESSION: Persistently low lung volumes with streaky opacities in the bases, favoring atelectasis though underlying  infection could have a similar appearance. Stable cardiomegaly. Electronically Signed   By: Lovena Le M.D.   On: 05/08/2019 19:10    EKG: Independently reviewed.  Sinus tachycardia, T wave inversions in inferior and lateral leads appear new compared to prior tracing.  Assessment/Plan Principal Problem:   Sepsis ([place]) Active Problems:   Gout   Acute kidney injury ([place])   Diabetes mellitus type 2 in obese ([facility])   Hypokalemia   Sepsis secondary to possible community-acquired pneumonia Afebrile.  Tachycardic and slightly tachypneic.  Blood pressure stable.  Not hypoxic.  No leukocytosis.  Lactic acid normal.  Chest x-ray showing streaky opacities in the bases.  -1 L normal saline bolus ordered in the ED.  Continue IV fluid hydration. -Continue ceftriaxone and azithromycin.  Penicillins listed as an allergy but patient has been able to tolerate ceftriaxone in the past. -SARS-CoV-2 test pending, continue airborne and contact precautions -Blood culture x2 pending  Insulin-dependent type 2 diabetes, hyperglycemia/ ?Mild DKA Bicarb 19, anion gap 21.  Blood glucose 273.  Last A1c 6.7 on 10/22.  Does take chronic steroids for chronic bronchitis. -NovoLog 10 units -IV fluid hydration -UA to check for ketones -Beta hydroxybutyrate level pending -Repeat BMP after IV fluid bolus and continue to monitor closely. -Continue home Levemir 40 units twice daily -Sliding scale insulin with meals  Addendum: Patient was  started on glucose stabilizer as labs suggestive of DKA.  Monitor BMP every 4 hours and CBG checks every 1 hour.  AKI on CKD stage II-III Creatinine 1.9, baseline 1.0-1.1.  Likely prerenal due to dehydration/sepsis and home diuretic plus ARB use. -IV fluid hydration -Monitor urine output -Monitor renal function -Avoid nephrotoxic agents.  Hold home irbesartan and hydrochlorothiazide.  Mild hypokalemia Likely related to home diuretic use.  Potassium 3.3.  -Replete potassium -Check  magnesium level and replete if low -Continue to monitor BMP  Acute metabolic encephalopathy Likely related to pneumonia/ ?possible UTI.  Gabapentin, Norco, and Flexeril listed in home medications and could also be possibly contributing.  Blood ethanol level <10.  Ammonia level <9.  Head CT negative for acute finding.  Neuro exam nonfocal.  Meningitis less likely given no fever, leukocytosis, or signs of meningeal irritation. -Continue antibiotics as above for pneumonia -IV fluid hydration -Hold home gabapentin, Norco, and Flexeril. -Rule out UTI, UA pending -UDS pending -Check TSH level  Abnormal EKG EKG showing T wave inversions in inferior and lateral leads appear new compared to prior tracing.  High-sensitivity troponin checked initially negative.  Patient is not complaining of chest pain and appears comfortable. -Cardiac monitoring -Trend troponin  Chronic bronchitis on chronic low-dose steroids -Continue home prednisone 20 mg daily.  Monitor blood glucose closely. -Continue home antitussives  Gout -Continue allopurinol  Chronic constipation -Continue home medications  DVT prophylaxis: Lovenox Code Status: Full code Family Communication: No family available at this time. Disposition Plan: Anticipate discharge after clinical improvement. Consults called: None Admission status: It is my clinical opinion that admission to INPATIENT is reasonable and necessary in this 68 y.o. female . presenting with sepsis secondary to possible community-acquired pneumonia, hyperglycemia, AKI, and acute metabolic encephalopathy.  Given the aforementioned, the predictability of an adverse outcome is felt to be significant. I expect that the patient will require at least 2 midnights in the hospital to treat this condition.   The medical decision making on this patient was of high complexity and the patient is at high risk for clinical deterioration, therefore this is a level 3 visit.  Shela Leff MD [facility] Pager [phone]  If 7PM-7AM, please contact night-coverage [email or web address] Password [facility]  05/08/2019, 10:57 PM

## 2019-05-09 ENCOUNTER — Other Ambulatory Visit: Payer: Self-pay

## 2019-05-09 ENCOUNTER — Encounter (HOSPITAL_COMMUNITY): Payer: Self-pay

## 2019-05-09 LAB — URINALYSIS, ROUTINE W REFLEX MICROSCOPIC
Bilirubin Urine: NEGATIVE
Glucose, UA: >=500 mg/dL — AB
Ketones, ur: 20 mg/dL — AB
Leukocytes,Ua: NEGATIVE
Nitrite: NEGATIVE
Protein, ur: 30 mg/dL — AB
Specific Gravity, Urine: 1.018 (ref 1.005–1.030)
pH: 5 (ref 5.0–8.0)

## 2019-05-09 LAB — URINE CULTURE: Culture: NO GROWTH

## 2019-05-09 LAB — TROPONIN I (HIGH SENSITIVITY): Troponin I (High Sensitivity): 9 ng/L (ref <18)

## 2019-05-09 LAB — BASIC METABOLIC PANEL
Anion gap: 19 — ABNORMAL HIGH (ref 5–15)
Anion gap: 14 (ref 5–15)
BUN: 20 mg/dL (ref 8–23)
BUN: 20 mg/dL (ref 8–23)
CO2: 17 mmol/L — ABNORMAL LOW (ref 22–32)
CO2: 23 mmol/L (ref 22–32)
Calcium: 9.4 mg/dL (ref 8.9–10.3)
Calcium: 8.8 mg/dL — ABNORMAL LOW (ref 8.9–10.3)
Chloride: 98 mmol/L (ref 98–111)
Chloride: 100 mmol/L (ref 98–111)
Creatinine, Ser: 2.03 mg/dL — ABNORMAL HIGH (ref 0.44–1.00)
Creatinine, Ser: 1.75 mg/dL — ABNORMAL HIGH (ref 0.44–1.00)
GFR calc Af Amer: 28 mL/min — ABNORMAL LOW (ref >60)
GFR calc Af Amer: 34 mL/min — ABNORMAL LOW (ref >60)
GFR calc non Af Amer: 24 mL/min — ABNORMAL LOW (ref >60)
GFR calc non Af Amer: 29 mL/min — ABNORMAL LOW (ref >60)
Glucose, Bld: 231 mg/dL — ABNORMAL HIGH (ref 70–99)
Glucose, Bld: 234 mg/dL — ABNORMAL HIGH (ref 70–99)
Potassium: 5.4 mmol/L — ABNORMAL HIGH (ref 3.5–5.1)
Potassium: 3.3 mmol/L — ABNORMAL LOW (ref 3.5–5.1)
Sodium: 134 mmol/L — ABNORMAL LOW (ref 135–145)
Sodium: 137 mmol/L (ref 135–145)

## 2019-05-09 LAB — GLUCOSE, CAPILLARY
Glucose-Capillary: 188 mg/dL — ABNORMAL HIGH (ref 70–99)
Glucose-Capillary: 196 mg/dL — ABNORMAL HIGH (ref 70–99)
Glucose-Capillary: 205 mg/dL — ABNORMAL HIGH (ref 70–99)
Glucose-Capillary: 215 mg/dL — ABNORMAL HIGH (ref 70–99)
Glucose-Capillary: 195 mg/dL — ABNORMAL HIGH (ref 70–99)
Glucose-Capillary: 205 mg/dL — ABNORMAL HIGH (ref 70–99)

## 2019-05-09 LAB — RAPID URINE DRUG SCREEN, HOSP PERFORMED
Amphetamines: NOT DETECTED
Barbiturates: NOT DETECTED
Benzodiazepines: NOT DETECTED
Cocaine: NOT DETECTED
Opiates: NOT DETECTED
Tetrahydrocannabinol: NOT DETECTED

## 2019-05-09 LAB — MAGNESIUM: Magnesium: 1.9 mg/dL (ref 1.7–2.4)

## 2019-05-09 LAB — BETA-HYDROXYBUTYRIC ACID: Beta-Hydroxybutyric Acid: 4.77 mmol/L — ABNORMAL HIGH (ref 0.05–0.27)

## 2019-05-09 LAB — TSH: TSH: 3.256 u[IU]/mL (ref 0.350–4.500)

## 2019-05-09 MED ORDER — INSULIN ASPART 100 UNIT/ML ~~LOC~~ SOLN
0.0000 [IU] | Freq: Every day | SUBCUTANEOUS | Status: DC
  Administered 2019-05-09: 2 [IU] via SUBCUTANEOUS

## 2019-05-09 MED ORDER — SODIUM CHLORIDE 0.9 % IV SOLN
INTRAVENOUS | Status: DC
  Administered 2019-05-09 – 2019-05-10 (×2): via INTRAVENOUS

## 2019-05-09 MED ORDER — DEXTROSE-NACL 5-0.45 % IV SOLN
INTRAVENOUS | Status: DC
  Administered 2019-05-09: 09:00:00 via INTRAVENOUS

## 2019-05-09 MED ORDER — INSULIN REGULAR(HUMAN) IN NACL 100-0.9 UT/100ML-% IV SOLN
INTRAVENOUS | Status: DC
  Filled 2019-05-09: qty 100

## 2019-05-09 MED ORDER — ENOXAPARIN SODIUM 60 MG/0.6ML ~~LOC~~ SOLN
60.0000 mg | SUBCUTANEOUS | Status: DC
  Administered 2019-05-10 – 2019-05-11 (×2): 60 mg via SUBCUTANEOUS
  Filled 2019-05-09 (×2): qty 0.6

## 2019-05-09 MED ORDER — INSULIN ASPART 100 UNIT/ML ~~LOC~~ SOLN
0.0000 [IU] | Freq: Three times a day (TID) | SUBCUTANEOUS | Status: DC
  Administered 2019-05-09: 18:00:00 3 [IU] via SUBCUTANEOUS
  Administered 2019-05-10: 09:00:00 2 [IU] via SUBCUTANEOUS
  Administered 2019-05-11: 3 [IU] via SUBCUTANEOUS
  Administered 2019-05-11: 17:00:00 5 [IU] via SUBCUTANEOUS
  Administered 2019-05-11: 2 [IU] via SUBCUTANEOUS

## 2019-05-09 MED ORDER — INSULIN GLARGINE 100 UNIT/ML ~~LOC~~ SOLN
30.0000 [IU] | Freq: Every day | SUBCUTANEOUS | Status: DC
  Administered 2019-05-09 – 2019-05-11 (×3): 30 [IU] via SUBCUTANEOUS
  Filled 2019-05-09 (×3): qty 0.3

## 2019-05-09 MED ORDER — SODIUM CHLORIDE 0.9% FLUSH: 10.0000 mL | INTRAVENOUS | Status: DC | PRN

## 2019-05-09 NOTE — Progress Notes (Signed)
Lovenox per Pharmacy for DVT Prophylaxis    Pharmacy has been consulted from dosing enoxaparin (lovenox) in this patient for DVT prophylaxis.  The pharmacist has reviewed pertinent labs (Hgb 13.7___; PLT_271__), patient weight (_125__kg) and renal function (CrCl_34__mL/min) and decided that enoxaparin _60_mg SQ Q24Hrs is appropriate for this patient.  The pharmacy department will sign off at this time.  Please reconsult pharmacy if status changes or for further issues.  Thank you  Cyndia Diver PharmD, BCPS  05/09/2019, 3:59 AM

## 2019-05-09 NOTE — Progress Notes (Signed)
Received report from [place], [place] from 4th floor. I asked her to notify the MD about lab being unable to obtain a BMET this am and to also ask for a midline to be placed for blood draws so proper treatment could be started. Margreta Journey stated she would call me back after she heard back from the MD.

## 2019-05-09 NOTE — Progress Notes (Signed)
PROGRESS NOTE    Christy May  [id number] DOB: 1950-10-07 DOA: 05/08/2019 PCP: Lucianne Lei, MD   Brief Narrative:  Patient is a 58 female with history of morbid obesity, insulin-dependent diabetes type 2, low back pain, neuropathy/diabetic nephropathy, OSA on CPAP, COPD, chronic bronchitis on low-dose of steroids, vitamin D deficiency, GERD, headaches, hypertension, depression, gout who presents to the emergency department with complaints of altered mental status.  She was recently discharged from [facility].  Patient was recently admitted on 11/2 with similar complaint when she was found to be confused and it was thought to be secondary to AKI superimposed with polypharmacy.  She was discharged to [facility] on 04/26/19.  At that time gabapentin dose was reduced. On presentation she was found to be tachycardic.  Found to have acute kidney injury with creatinine of 1.9.  Her baseline creatinine is 1-1.1.  UA was impressive for urinary tract infection so urine culture sent.  Chest x-ray showed streaky opacities in the bases.  Urine showed a lot of ketones, beta hydroxybutyrate level positive in serum.  Started on antibiotics for possible CAP and insulin drip for DKA.  Assessment & Plan:   Principal Problem:   Sepsis ([place]) Active Problems:   Gout   Acute kidney injury ([place])   Diabetes mellitus type 2 in obese (H[facility])   Hypokalemia   Suspected sepsis secondary to possible community-acquired pneumonia: Currently hemodynamically stable.  Tachycardic.  Not hypoxic.  Chest x-ray showed streaky opacities on the bases.  Continue current antibiotics.  Covid test 19 negative. Follow-up cultures.  DKA: Elevated anion gap.  Blood glucose not that high.  Last hemoglobin A1c was 6.7 on 10/22.  On steroids for chronic bronchitis.  Continue DKA protocol.  Positive beta hydroxybutyrate level and ketones in urine.  Diabetic coordinator following.  AKI on CKD stage III -III: Most likely prerenal  due to dehydration, sepsis.  Also on home diuretics and ARB.  She looks dehydrated.  Continue IV hydration ,continue to monitor BMP.  Hold irbesartan and hydrochlorothiazide  Altered mental status: Secondary to acute metabolic encephalopathy from possible DKA, pneumonia, polypharmacy.  On gabapentin, Norco, Flexeril at home which could have contributed.  Blood ethanol, ammonia level negative.  Head CT negative for any acute intracranial abnormalities.  Continue to monitor mental status.  Hypokalemia: Supplemented  Abnormal EKG: EKG showed T wave inversions in inferior and lateral leads.  Denies any chest pain.  High sensitive troponin negative  Chronic bronchitis: Currently stable.  On prednisone 20 mg daily at home.  Gout: On allopurinol  Chronic constipation: Continue bowel regimen            DVT prophylaxis: Lovenox Code Status: Full Family Communication: None present at the bedside Disposition Plan: Undetermined at this point   Consultants: None  Procedures: None  Antimicrobials:  Anti-infectives (From admission, onward)   Start     Dose/Rate Route Frequency Ordered Stop   05/09/19 2000  cefTRIAXone (ROCEPHIN) 1 g in sodium chloride 0.9 % 100 mL IVPB     1 g 200 mL/hr over 30 Minutes Intravenous Every 24 hours 05/08/19 2236     05/09/19 2000  azithromycin (ZITHROMAX) 500 mg in sodium chloride 0.9 % 250 mL IVPB     500 mg 250 mL/hr over 60 Minutes Intravenous Every 24 hours 05/08/19 2236     05/08/19 2045  cefTRIAXone (ROCEPHIN) 1 g in sodium chloride 0.9 % 100 mL IVPB     1 g 200 mL/hr over 30  Minutes Intravenous  Once 05/08/19 2042 05/08/19 2309   05/08/19 2045  azithromycin (ZITHROMAX) 500 mg in sodium chloride 0.9 % 250 mL IVPB     500 mg 250 mL/hr over 60 Minutes Intravenous  Once 05/08/19 2042 05/09/19 0210      Subjective:  Patient seen and examined the bedside this morning.  Hemodynamically stable.  Confused but does not have any kind of distress.  She  was a hard stick so midline being placed.  Did not participate with any meaningful conversation but remains alert and awake.  objective: Vitals:   05/09/19 0200 05/09/19 0318 05/09/19 0351 05/09/19 0855  BP: 138/71 131/65  121/67  Pulse: (!) 113 (!) 110  (!) 115  Resp: (!) 21 16    Temp:  97.8 F (36.6 C)    TempSrc:      SpO2: 99% 100%  100%  Weight:   125.7 kg   Height:   5\' 4"  (1.626 m)     Intake/Output Summary (Last 24 hours) at 05/09/2019 1116 Last data filed at 05/09/2019 0547 Gross per 24 hour  Intake -  Output 300 ml  Net -300 ml   Filed Weights   05/09/19 0351  Weight: 125.7 kg    Examination:  General exam: Morbidly obese HEENT:PERRL,Oral mucosa dry Respiratory system: Diminished air entry on the bases Cardiovascular system: S1 & S2 heard, RRR. No JVD, murmurs, rubs, gallops or clicks. No pedal edema. Gastrointestinal system: Abdomen is nondistended, soft and nontender. No organomegaly or masses felt. Normal bowel sounds heard. Central nervous system: Alert and awake but not oriented Extremities: No edema, no clubbing ,no cyanosis, distal peripheral pulses palpable. Skin: No rashes, lesions or ulcers,no icterus ,no pallor   Data Reviewed: I have personally reviewed following labs and imaging studies  CBC: Recent Labs  Lab 05/08/19 1624  WBC 8.8  NEUTROABS 7.1  HGB 13.7  HCT 44.0  MCV 93.2  PLT 99991111   Basic Metabolic Panel: Recent Labs  Lab 05/08/19 1624 05/09/19 0144  NA 136 134*  K 3.3* 5.4*  CL 96* 98  CO2 19* 17*  GLUCOSE 273* 231*  BUN 18 20  CREATININE 1.94* 2.03*  CALCIUM 9.7 9.4  MG  --  1.9   GFR: Estimated Creatinine Clearance: 34.3 mL/min (A) (by C-G formula based on SCr of 2.03 mg/dL (H)). Liver Function Tests: Recent Labs  Lab 05/08/19 1624  AST 12*  ALT 29  ALKPHOS 82  BILITOT 1.2  PROT 7.2  ALBUMIN 3.1*   No results for input(s): LIPASE, AMYLASE in the last 168 hours. Recent Labs  Lab 05/08/19 1624  AMMONIA  <9*   Coagulation Profile: No results for input(s): INR, PROTIME in the last 168 hours. Cardiac Enzymes: Recent Labs  Lab 05/08/19 1654  CKTOTAL 72   BNP (last 3 results) No results for input(s): PROBNP in the last 8760 hours. HbA1C: No results for input(s): HGBA1C in the last 72 hours. CBG: Recent Labs  Lab 05/09/19 0552 05/09/19 0728  GLUCAP 188* 196*   Lipid Profile: No results for input(s): CHOL, HDL, LDLCALC, TRIG, CHOLHDL, LDLDIRECT in the last 72 hours. Thyroid Function Tests: Recent Labs    05/09/19 0144  TSH 3.256   Anemia Panel: No results for input(s): VITAMINB12, FOLATE, FERRITIN, TIBC, IRON, RETICCTPCT in the last 72 hours. Sepsis Labs: Recent Labs  Lab 05/08/19 1624  LATICACIDVEN 1.5    Recent Results (from the past 240 hour(s))  Blood culture (routine x 2)  Status: None (Preliminary result)   Collection Time: 05/08/19  4:18 PM   Specimen: BLOOD  Result Value Ref Range Status   Specimen Description   Final    BLOOD LEFT ANTECUBITAL Performed at [facility], [place] [address] [place], [place] [address]    Special Requests   Final    BOTTLES DRAWN AEROBIC AND ANAEROBIC Blood Culture adequate volume Performed at [place] [address], [place], [place] [address]    Culture   Final    NO GROWTH < 24 HOURS Performed at [place] [address] [place], [place] [address]    Report Status PENDING  Incomplete  Blood culture (routine x 2)     Status: None (Preliminary result)   Collection Time: 05/08/19  4:30 PM   Specimen: Right Antecubital; Blood  Result Value Ref Range Status   Specimen Description   Final    RIGHT ANTECUBITAL Performed at [facility] [address] [place], [place] [address]    Special Requests   Final    BOTTLES DRAWN AEROBIC AND ANAEROBIC Blood Culture adequate volume Performed at [facility] [address], [place], [place] [address]     Culture   Final    NO GROWTH < 24 HOURS Performed at [facility] [address], [place], [place] [address]    Report Status PENDING  Incomplete  SARS CORONAVIRUS 2 (TAT 6-24 HRS) Nasopharyngeal Nasopharyngeal Swab     Status: None   Collection Time: 05/08/19  4:49 PM   Specimen: Nasopharyngeal Swab  Result Value Ref Range Status   SARS Coronavirus 2 NEGATIVE NEGATIVE Final    Comment: (NOTE) SARS-CoV-2 target nucleic acids are NOT DETECTED. The SARS-CoV-2 RNA is generally detectable in upper and lower respiratory specimens during the acute phase of infection. Negative results do not preclude SARS-CoV-2 infection, do not rule out co-infections with other pathogens, and should not be used as the sole basis for treatment or other patient management decisions. Negative results must be combined with clinical observations, patient history, and epidemiological information. The expected result is Negative. Fact Sheet for Patients: SugarRoll.be Fact Sheet for Healthcare Providers: https://www.woods-mathews.com/ This test is not yet approved or cleared by the [place] FDA and  has been authorized for detection and/or diagnosis of SARS-CoV-2 by FDA under an Emergency Use Authorization (EUA). This EUA will remain  in effect (meaning this test can be used) for the duration of the COVID-19 declaration under Section 56 4(b)(1) of the Act, 21 U.S.C. section 360bbb-3(b)(1), unless the authorization is terminated or revoked sooner. Performed at [facility], [place] [address], [place],  [address]          Radiology Studies: Ct Head Wo Contrast  Result Date: 05/08/2019 CLINICAL DATA:  Altered mental status EXAM: CT HEAD WITHOUT CONTRAST TECHNIQUE: Contiguous axial images were obtained from the base of the skull through the vertex without intravenous contrast. COMPARISON:  CT brain 04/24/2019 FINDINGS: Brain: Mild motion  degradation. No acute territorial infarction, hemorrhage, or intracranial mass. Mild small vessel ischemic changes of the white matter. Mild atrophy. Stable ventricle size Vascular: No hyperdense vessels. Scattered calcifications at the carotid siphons. Skull: Normal. Negative for fracture or focal lesion. Sinuses/Orbits: No acute finding. Other: None IMPRESSION: 1. Mild motion degraded study. 2. No CT evidence for acute intracranial abnormality. Mild atrophy and small vessel ischemic changes of the white matter. Electronically Signed   By: Maudie Mercury  Francoise Ceo M.D.   On: 05/08/2019 17:08   Dg Chest Portable 1 View  Result Date: 05/08/2019 CLINICAL DATA:  Altered mental status EXAM: PORTABLE CHEST 1 VIEW COMPARISON:  Radiograph 04/24/2019 FINDINGS: Lung volumes are persistently low with streaky opacities in the bases favoring atelectasis though underlying infection could have a similar appearance. No pneumothorax. No effusion. Stable cardiomediastinal contours with mild cardiomegaly. Cervical fixation hardware is partially visualized. No acute osseous or soft tissue abnormality. IMPRESSION: Persistently low lung volumes with streaky opacities in the bases, favoring atelectasis though underlying infection could have a similar appearance. Stable cardiomegaly. Electronically Signed   By: Lovena Le M.D.   On: 05/08/2019 19:10        Scheduled Meds: . allopurinol  300 mg Oral Daily  . benzonatate  100 mg Oral QHS  . cholecalciferol  2,000 Units Oral Daily  . cycloSPORINE  1 drop Both Eyes BID  . docusate sodium  100 mg Oral BID  . enoxaparin (LOVENOX) injection  60 mg Subcutaneous Q24H  . fluticasone  2 spray Each Nare Daily  . guaiFENesin  600 mg Oral BID  . lidocaine  1 patch Transdermal BID  . loratadine  10 mg Oral Daily  . montelukast  10 mg Oral Daily  . multivitamin with minerals  1 tablet Oral QHS  . oxybutynin  15 mg Oral QPM  . pantoprazole  40 mg Oral BID  . polycarbophil  625 mg Oral  BID  . polyethylene glycol  17 g Oral Daily  . potassium chloride  40 mEq Oral Once  . predniSONE  20 mg Oral Q breakfast  . senna  1 tablet Oral QHS  . sodium chloride  2 spray Each Nare BID  . triamcinolone cream  1 application Topical BID   Continuous Infusions: . sodium chloride 150 mL/hr at 05/09/19 0913  . azithromycin    . cefTRIAXone (ROCEPHIN)  IV    . dextrose 5 % and 0.45% NaCl 100 mL/hr at 05/09/19 0917  . insulin       LOS: 1 day    Time spent: 25 mins.More than 50% of that time was spent in counseling and/or coordination of care.      Shelly Coss, MD [facility] Pager [phone]  If 7PM-7AM, please contact night-coverage [email or web address] Password [email or web address] 05/09/2019, 11:16 AM

## 2019-05-09 NOTE — ED Notes (Signed)
ED TO INPATIENT HANDOFF REPORT  Name/Age/Gender Christy May 68 y.o. female  Code Status    Code Status Orders  (From admission, onward)         Start     Ordered   05/08/19 2233  Full code  Continuous     05/08/19 2236        Code Status History    Date Active Date Inactive Code Status Order ID Comments User Context   04/24/2019 0350 04/27/2019 0231 Full Code [id number]  Jani Gravel, MD ED   03/25/2018 1431 03/28/2018 2235 Full Code [id number]  Reubin Milan, MD ED   02/11/2018 2148 02/23/2018 0326 Full Code [id number]  Toy Baker, MD Inpatient   12/15/2017 0258 12/17/2017 1355 Full Code [id number]  Rise Patience, MD Inpatient   11/17/2017 2230 11/22/2017 1555 Full Code [id number]  Rise Patience, MD Inpatient   08/30/2017 1756 09/03/2017 2130 Full Code [id number]  Tarry Kos, MD ED   08/13/2017 0135 08/17/2017 1820 Full Code [id number]  Karmen Bongo, MD Inpatient   07/14/2015 0029 07/16/2015 2216 Full Code [id number]  Gennaro Africa, MD ED   09/02/2014 0919 09/05/2014 1738 Full Code [id number]  Dhungel, Flonnie Overman, MD Inpatient   Advance Care Planning Activity      Home/SNF/Other Home  Chief Complaint altered mental status  Level of Care/Admitting Diagnosis ED Disposition    ED Disposition Condition [place]: [facility] [id number]  Level of Care: Telemetry [5]  Admit to tele based on following criteria: Complex arrhythmia (Bradycardia/Tachycardia)  Covid Evaluation: Confirmed COVID Negative  [facility]: [id number]  Admitting Physician: Shela Leff [id number]  Attending Physician: Shela Leff [phone]  Estimated length of stay: past midnight tomorrow  Certification:: I certify this patient will need inpatient services for at least 2 midnights  PT Class (Do Not Modify): Inpatient [101]  PT Acc Code (Do Not Modify): Private [1]       Medical History Past Medical History:   Diagnosis Date  . Allergy   . Anemia, unspecified   . Anxiety   . Arthritis    "neck, back, hands" (09/03/2014)  . Chronic airway obstruction, not elsewhere classified    "I was told I don't have this/tests done @ [facility] 05/2014"  . Chronic back pain   . Colon polyps   . Complication of anesthesia    "I've had recall; I probably stopped breathing at least 2 times" (09/03/2014)  . Dehydration   . Depressive disorder, not elsewhere classified   . Diabetic peripheral neuropathy ([employer])   . Dysphagia 2007   historyof dysphagia with severe dysmotility by barium swallow-[name]  . Esophageal reflux   . Family history of adverse reaction to anesthesia    "my brother was told they lost him a couple times during OR"  . HCAP (healthcare-associated pneumonia) 03/25/2018  . Heart murmur   . History of hiatal hernia   . Migraine    "couple times/month right now" (09/03/2014)  . Obesity, unspecified   . OSA (obstructive sleep apnea)     CPAP  . Septic shock ([place]) 08/30/2017  . Spondylosis of unspecified site without mention of myelopathy   . Type II diabetes mellitus ([place])   . Unspecified essential hypertension   . Unspecified menopausal and postmenopausal disorder   . Unspecified vitamin D deficiency   . Upper airway cough syndrome     Allergies Allergies  Allergen Reactions  . Ace Inhibitors Anaphylaxis and Cough  Tolerates Irbesartan (home med)  . Penicillins Hives and Other (See Comments)    Tolerated ceftriaxone in 2019  Has patient had a PCN reaction causing immediate rash, facial/tongue/throat swelling, SOB or lightheadedness with hypotension: Yes Has patient had a PCN reaction causing severe rash involving mucus membranes or skin necrosis:  No Has patient had a PCN reaction that required hospitalization: No Has patient had a PCN reaction occurring within the last 10 years: No If all of the above answers are "NO", then may proceed with Cephalosporin use.  . Adhesive  [Tape] Hives and Rash    IV Location/Drains/Wounds Patient Lines/Drains/Airways Status   Active Line/Drains/Airways    Name:   Placement date:   Placement time:   Site:   Days:   Peripheral IV 05/08/19 Right Antecubital   05/08/19    1621    Antecubital   1   External Urinary Catheter   04/24/19    1722    -   15   Incision (Closed) 02/17/18 Back Other (Comment)   02/17/18    1802     446   Pressure Injury 08/13/17 Stage II -  Partial thickness loss of dermis presenting as a shallow open ulcer with a red, pink wound bed without slough.   08/13/17    0146     634   Pressure Injury 08/12/17 Stage II -  Partial thickness loss of dermis presenting as a shallow open ulcer with a red, pink wound bed without slough.   08/12/17    0030     635          Labs/Imaging Results for orders placed or performed during the hospital encounter of 05/08/19 (from the past 48 hour(s))  CBC with Differential     Status: Abnormal   Collection Time: 05/08/19  4:24 PM  Result Value Ref Range   WBC 8.8 4.0 - 10.5 K/uL   RBC 4.72 3.87 - 5.11 MIL/uL   Hemoglobin 13.7 12.0 - 15.0 g/dL   HCT 44.0 36.0 - 46.0 %   MCV 93.2 80.0 - 100.0 fL   MCH 29.0 26.0 - 34.0 pg   MCHC 31.1 30.0 - 36.0 g/dL   RDW 16.3 (H) 11.5 - 15.5 %   Platelets 271 150 - 400 K/uL   nRBC 0.0 0.0 - 0.2 %   Neutrophils Relative % 80 %   Neutro Abs 7.1 1.7 - 7.7 K/uL   Lymphocytes Relative 10 %   Lymphs Abs 0.9 0.7 - 4.0 K/uL   Monocytes Relative 8 %   Monocytes Absolute 0.7 0.1 - 1.0 K/uL   Eosinophils Relative 1 %   Eosinophils Absolute 0.1 0.0 - 0.5 K/uL   Basophils Relative 0 %   Basophils Absolute 0.0 0.0 - 0.1 K/uL   Immature Granulocytes 1 %   Abs Immature Granulocytes 0.04 0.00 - 0.07 K/uL    Comment: Performed at [facility], [place] [address], [place], [place] [address]  Comprehensive metabolic panel     Status: Abnormal   Collection Time: 05/08/19  4:24 PM  Result Value Ref Range   Sodium 136 135 - 145  mmol/L   Potassium 3.3 (L) 3.5 - 5.1 mmol/L   Chloride 96 (L) 98 - 111 mmol/L   CO2 19 (L) 22 - 32 mmol/L   Glucose, Bld 273 (H) 70 - 99 mg/dL   BUN 18 8 - 23 mg/dL   Creatinine, Ser 1.94 (H) 0.44 - 1.00 mg/dL   Calcium 9.7  8.9 - 10.3 mg/dL   Total Protein 7.2 6.5 - 8.1 g/dL   Albumin 3.1 (L) 3.5 - 5.0 g/dL   AST 12 (L) 15 - 41 U/L   ALT 29 0 - 44 U/L   Alkaline Phosphatase 82 38 - 126 U/L   Total Bilirubin 1.2 0.3 - 1.2 mg/dL   GFR calc non Af Amer 26 (L) >60 mL/min   GFR calc Af Amer 30 (L) >60 mL/min   Anion gap 21 (H) 5 - 15    Comment: REPEATED TO VERIFY Performed at [place] [address], [place], [place] [address]   Troponin I (High Sensitivity)     Status: None   Collection Time: 05/08/19  4:24 PM  Result Value Ref Range   Troponin I (High Sensitivity) 10 <18 ng/L    Comment: (NOTE) Elevated high sensitivity troponin I (hsTnI) values and significant  changes across serial measurements may suggest ACS but many other  chronic and acute conditions are known to elevate hsTnI results.  Refer to the Links section for chest pain algorithms and additional  guidance. Performed at [facility], [place] [address], [place], [place] [address]   Lactic acid, plasma     Status: None   Collection Time: 05/08/19  4:24 PM  Result Value Ref Range   Lactic Acid, Venous 1.5 0.5 - 1.9 mmol/L    Comment: Performed at [facility], [place] [address], [place], [place] [address]  Ethanol     Status: None   Collection Time: 05/08/19  4:24 PM  Result Value Ref Range   Alcohol, Ethyl (B) <10 <10 mg/dL    Comment: (NOTE) Lowest detectable limit for serum alcohol is 10 mg/dL. For medical purposes only. Performed at [facility], [place] [address], [place], [place] [address]   Ammonia     Status: Abnormal   Collection Time: 05/08/19  4:24 PM  Result Value Ref Range   Ammonia <9 (L) 9 - 35 umol/L    Comment:  Performed at [facility], [place] [address], [place], [place] [address]  SARS CORONAVIRUS 2 (TAT 6-24 HRS) Nasopharyngeal Nasopharyngeal Swab     Status: None   Collection Time: 05/08/19  4:49 PM   Specimen: Nasopharyngeal Swab  Result Value Ref Range   SARS Coronavirus 2 NEGATIVE NEGATIVE    Comment: (NOTE) SARS-CoV-2 target nucleic acids are NOT DETECTED. The SARS-CoV-2 RNA is generally detectable in upper and lower respiratory specimens during the acute phase of infection. Negative results do not preclude SARS-CoV-2 infection, do not rule out co-infections with other pathogens, and should not be used as the sole basis for treatment or other patient management decisions. Negative results must be combined with clinical observations, patient history, and epidemiological information. The expected result is Negative. Fact Sheet for Patients: SugarRoll.be Fact Sheet for Healthcare Providers: https://www.woods-mathews.com/ This test is not yet approved or cleared by the [place] FDA and  has been authorized for detection and/or diagnosis of SARS-CoV-2 by FDA under an Emergency Use Authorization (EUA). This EUA will remain  in effect (meaning this test can be used) for the duration of the COVID-19 declaration under Section 56 4(b)(1) of the Act, 21 U.S.C. section 360bbb-3(b)(1), unless the authorization is terminated or revoked sooner. Performed at [facility], [place] [address] [place], [place] [address]   Blood gas, venous (at [facility] and [facility], not at [facility])     Status: Abnormal   Collection  Time: 05/08/19  4:49 PM  Result Value Ref Range   pH, Ven 7.280 7.250 - 7.430   pCO2, Ven 43.4 (L) 44.0 - 60.0 mmHg   pO2, Ven VALUE BELOW REPORTABLE RANGE 32.0 - 45.0 mmHg    Comment: CRITICAL RESULT CALLED TO, READ BACK BY AND VERIFIED WITH: MIKE BOWEN,RN [id number] @ 1707 BY J SCOTTON    Bicarbonate 19.7 (L) 20.0 - 28.0 mmol/L    Acid-base deficit 6.3 (H) 0.0 - 2.0 mmol/L   O2 Saturation 37.1 %    Comment: Performed at [facility], [place] [address] [place], [place] [address]  CK     Status: None   Collection Time: 05/08/19  4:54 PM  Result Value Ref Range   Total CK 72 38 - 234 U/L    Comment: Performed at [facility], [place] [address] [place], [place] [address]  Rapid urine drug screen (hospital performed)     Status: None   Collection Time: 05/08/19 11:50 PM  Result Value Ref Range   Opiates NONE DETECTED NONE DETECTED   Cocaine NONE DETECTED NONE DETECTED   Benzodiazepines NONE DETECTED NONE DETECTED   Amphetamines NONE DETECTED NONE DETECTED   Tetrahydrocannabinol NONE DETECTED NONE DETECTED   Barbiturates NONE DETECTED NONE DETECTED    Comment: (NOTE) DRUG SCREEN FOR MEDICAL PURPOSES ONLY.  IF CONFIRMATION IS NEEDED FOR ANY PURPOSE, NOTIFY LAB WITHIN 5 DAYS. LOWEST DETECTABLE LIMITS FOR URINE DRUG SCREEN Drug Class                     Cutoff (ng/mL) Amphetamine and metabolites    1000 Barbiturate and metabolites    200 Benzodiazepine                 [id number] Tricyclics and metabolites     300 Opiates and metabolites        300 Cocaine and metabolites        300 THC                            50 Performed at [facility], [place] [address], [place], [place] [address]   Urinalysis, Routine w reflex microscopic     Status: Abnormal   Collection Time: 05/08/19 11:50 PM  Result Value Ref Range   Color, Urine YELLOW (A) YELLOW   APPearance HAZY (A) CLEAR   Specific Gravity, Urine 1.018 1.005 - 1.030   pH 5.0 5.0 - 8.0   Glucose, UA >=500 (A) NEGATIVE mg/dL   Hgb urine dipstick SMALL (A) NEGATIVE   Bilirubin Urine NEGATIVE NEGATIVE   Ketones, ur 20 (A) NEGATIVE mg/dL   Protein, ur 30 (A) NEGATIVE mg/dL   Nitrite NEGATIVE NEGATIVE   Leukocytes,Ua NEGATIVE NEGATIVE   RBC / HPF 0-5 0 - 5 RBC/hpf   WBC, UA 6-10 0 - 5 WBC/hpf   Bacteria, UA MANY (A)  NONE SEEN   Squamous Epithelial / LPF 0-5 0 - 5   Mucus PRESENT     Comment: Performed at [facility], [place] [address], [place], [place] [address]   *Note: Due to a large number of results and/or encounters for the requested time period, some results have not been displayed. A complete set of results can be found in Results Review.   Ct Head Wo Contrast  Result Date: 05/08/2019 CLINICAL DATA:  Altered mental status EXAM: CT HEAD WITHOUT CONTRAST TECHNIQUE: Contiguous axial images  were obtained from the base of the skull through the vertex without intravenous contrast. COMPARISON:  CT brain 04/24/2019 FINDINGS: Brain: Mild motion degradation. No acute territorial infarction, hemorrhage, or intracranial mass. Mild small vessel ischemic changes of the white matter. Mild atrophy. Stable ventricle size Vascular: No hyperdense vessels. Scattered calcifications at the carotid siphons. Skull: Normal. Negative for fracture or focal lesion. Sinuses/Orbits: No acute finding. Other: None IMPRESSION: 1. Mild motion degraded study. 2. No CT evidence for acute intracranial abnormality. Mild atrophy and small vessel ischemic changes of the white matter. Electronically Signed   By: Donavan Foil M.D.   On: 05/08/2019 17:08   Dg Chest Portable 1 View  Result Date: 05/08/2019 CLINICAL DATA:  Altered mental status EXAM: PORTABLE CHEST 1 VIEW COMPARISON:  Radiograph 04/24/2019 FINDINGS: Lung volumes are persistently low with streaky opacities in the bases favoring atelectasis though underlying infection could have a similar appearance. No pneumothorax. No effusion. Stable cardiomediastinal contours with mild cardiomegaly. Cervical fixation hardware is partially visualized. No acute osseous or soft tissue abnormality. IMPRESSION: Persistently low lung volumes with streaky opacities in the bases, favoring atelectasis though underlying infection could have a similar appearance. Stable cardiomegaly.  Electronically Signed   By: Lovena Le M.D.   On: 05/08/2019 19:10    Pending Labs Unresulted Labs (From admission, onward)    Start     Ordered   05/09/19 [id number]  Basic metabolic panel  Tomorrow morning,   R     05/08/19 2236   05/08/19 2252  TSH  Once,   STAT     05/08/19 2251   05/08/19 2237  Magnesium  Add-on,   AD     05/08/19 2236   05/08/19 [id number]  Basic metabolic panel  ONCE - STAT,   STAT    Comments: Collect after IV fluid bolus.    05/08/19 2236   05/08/19 2228  Beta-hydroxybutyric acid  ONCE - STAT,   STAT     05/08/19 2236   05/08/19 1538  Urine culture  ONCE - STAT,   STAT     05/08/19 1538   05/08/19 1538  Blood culture (routine x 2)  BLOOD CULTURE X 2,   STAT     05/08/19 1538          Vitals/Pain Today's Vitals   05/08/19 2227 05/08/19 2351 05/09/19 0035 05/09/19 0107  BP: (!) 176/118 (!) 115/58 (!) 144/117 101/64  Pulse: (!) 117 (!) 111 (!) 115 (!) 112  Resp: 18 20 (!) 21 18  Temp:      TempSrc:      SpO2: 92% 97% 100% 99%    Isolation Precautions No active isolations  Medications Medications  cefTRIAXone (ROCEPHIN) 1 g in sodium chloride 0.9 % 100 mL IVPB (has no administration in time range)  azithromycin (ZITHROMAX) 500 mg in sodium chloride 0.9 % 250 mL IVPB (has no administration in time range)  allopurinol (ZYLOPRIM) tablet 300 mg (has no administration in time range)  insulin detemir (LEVEMIR) injection 40 Units (40 Units Subcutaneous Given 05/08/19 2351)  predniSONE (DELTASONE) tablet 20 mg (has no administration in time range)  docusate sodium (COLACE) capsule 100 mg (has no administration in time range)  pantoprazole (PROTONIX) EC tablet 40 mg (has no administration in time range)  polycarbophil (FIBERCON) tablet 625 mg (has no administration in time range)  polyethylene glycol (MIRALAX / GLYCOLAX) packet 17 g (has no administration in time range)  senna (SENOKOT) tablet 8.6 mg (has no administration in time  range)  oxybutynin (DITROPAN  XL) 24 hr tablet 15 mg (has no administration in time range)  Vitamin D-3 CAPS 2,000 Units (has no administration in time range)  multivitamin with minerals tablet 1 tablet (has no administration in time range)  fluticasone (FLONASE) 50 MCG/ACT nasal spray 2 spray (has no administration in time range)  guaiFENesin (MUCINEX) 12 hr tablet 600 mg (has no administration in time range)  benzonatate (TESSALON) capsule 100 mg (has no administration in time range)  loratadine (CLARITIN) tablet 10 mg (has no administration in time range)  montelukast (SINGULAIR) tablet 10 mg (has no administration in time range)  sodium chloride (OCEAN) 0.65 % nasal spray 2 spray (has no administration in time range)  cycloSPORINE (RESTASIS) 0.05 % ophthalmic emulsion 1 drop (has no administration in time range)  lidocaine (LIDODERM) 5 % 1 patch (has no administration in time range)  triamcinolone cream (KENALOG) 0.1 % 1 application (has no administration in time range)  enoxaparin (LOVENOX) injection 40 mg (has no administration in time range)  acetaminophen (TYLENOL) tablet 650 mg (has no administration in time range)    Or  acetaminophen (TYLENOL) suppository 650 mg (has no administration in time range)  0.9 %  sodium chloride infusion (has no administration in time range)  insulin aspart (novoLOG) injection 0-9 Units (has no administration in time range)  potassium chloride SA (KLOR-CON) CR tablet 40 mEq (has no administration in time range)  insulin aspart (novoLOG) injection 10 Units (has no administration in time range)  cefTRIAXone (ROCEPHIN) 1 g in sodium chloride 0.9 % 100 mL IVPB (0 g Intravenous Stopped 05/08/19 2309)  azithromycin (ZITHROMAX) 500 mg in sodium chloride 0.9 % 250 mL IVPB (500 mg Intravenous New Bag/Given 05/08/19 2317)  sodium chloride 0.9 % bolus 1,000 mL (0 mLs Intravenous Stopped 05/08/19 2359)    Mobility

## 2019-05-09 NOTE — Progress Notes (Signed)
Inpatient Diabetes Program Recommendations  AACE/ADA: New Consensus Statement on Inpatient Glycemic Control (2015)  Target Ranges:  Prepandial:   less than 140 mg/dL      Peak postprandial:   less than 180 mg/dL (1-2 hours)      Critically ill patients:  140 - 180 mg/dL   Lab Results  Component Value Date   GLUCAP 205 (H) 05/09/2019   HGBA1C 6.7 (A) 04/13/2019    Review of Glycemic Control  Diabetes history: DM2 Outpatient Diabetes medications: Trulicity 1.5 mg Q Fridays, Levemir 40 units bid, Novolog 12 units tidwc Current orders for Inpatient glycemic control: Lantus 30 units QD, Novolog 0-15 units tidwc and 0-5 units QHS  HgbA1C - 6.7% On CL diet  Inpatient Diabetes Program Recommendations:     Change Novolog to 0-15 units Q4H while diet is CL. When diet is advanced to CHO mod, then tidwc and hs. Will need meal coverage insulin when eating diet.   Spoke with pt this am regarding her glycemic control PTA. Pt seemed a bit confused and could not answer specific questions about type of insulin and doses. She did say she ran out of insulin.  Follow closely.  Thank you. Lorenda Peck, RD, LDN, CDE Inpatient Diabetes Coordinator [phone]

## 2019-05-10 LAB — BASIC METABOLIC PANEL
Anion gap: 14 (ref 5–15)
BUN: 15 mg/dL (ref 8–23)
CO2: 18 mmol/L — ABNORMAL LOW (ref 22–32)
Calcium: 8.5 mg/dL — ABNORMAL LOW (ref 8.9–10.3)
Chloride: 102 mmol/L (ref 98–111)
Creatinine, Ser: 1.11 mg/dL — ABNORMAL HIGH (ref 0.44–1.00)
GFR calc Af Amer: 59 mL/min — ABNORMAL LOW (ref >60)
GFR calc non Af Amer: 51 mL/min — ABNORMAL LOW (ref >60)
Glucose, Bld: 157 mg/dL — ABNORMAL HIGH (ref 70–99)
Potassium: 2.9 mmol/L — ABNORMAL LOW (ref 3.5–5.1)
Sodium: 134 mmol/L — ABNORMAL LOW (ref 135–145)

## 2019-05-10 LAB — TSH: TSH: 2.733 u[IU]/mL (ref 0.350–4.500)

## 2019-05-10 LAB — MAGNESIUM: Magnesium: 1.6 mg/dL — ABNORMAL LOW (ref 1.7–2.4)

## 2019-05-10 LAB — GLUCOSE, CAPILLARY
Glucose-Capillary: 146 mg/dL — ABNORMAL HIGH (ref 70–99)
Glucose-Capillary: 116 mg/dL — ABNORMAL HIGH (ref 70–99)
Glucose-Capillary: 115 mg/dL — ABNORMAL HIGH (ref 70–99)
Glucose-Capillary: 115 mg/dL — ABNORMAL HIGH (ref 70–99)

## 2019-05-10 MED ORDER — MECLIZINE HCL 25 MG PO TABS
12.5000 mg | ORAL_TABLET | Freq: Once | ORAL | Status: AC
  Administered 2019-05-10: 23:00:00 12.5 mg via ORAL
  Filled 2019-05-10: qty 1

## 2019-05-10 MED ORDER — POTASSIUM CHLORIDE 10 MEQ/100ML IV SOLN
10.0000 meq | INTRAVENOUS | Status: AC
  Filled 2019-05-10 (×2): qty 100

## 2019-05-10 MED ORDER — ONDANSETRON HCL 4 MG/2ML IJ SOLN
4.0000 mg | Freq: Four times a day (QID) | INTRAMUSCULAR | Status: DC | PRN
  Administered 2019-05-11: 4 mg via INTRAVENOUS
  Filled 2019-05-10: qty 2

## 2019-05-10 MED ORDER — AZITHROMYCIN 250 MG PO TABS
500.0000 mg | ORAL_TABLET | Freq: Every day | ORAL | Status: DC
  Administered 2019-05-10: 500 mg via ORAL
  Filled 2019-05-10: qty 2

## 2019-05-10 MED ORDER — SODIUM BICARBONATE 650 MG PO TABS
650.0000 mg | ORAL_TABLET | Freq: Three times a day (TID) | ORAL | Status: DC
  Administered 2019-05-10 – 2019-05-11 (×5): 650 mg via ORAL
  Filled 2019-05-10 (×6): qty 1

## 2019-05-10 MED ORDER — POTASSIUM CHLORIDE 10 MEQ/100ML IV SOLN
10.0000 meq | INTRAVENOUS | Status: AC
  Administered 2019-05-10 – 2019-05-11 (×6): 10 meq via INTRAVENOUS
  Filled 2019-05-10: qty 100

## 2019-05-10 NOTE — TOC Initial Note (Signed)
Transition of Care [facility]) - Initial/Assessment Note    Patient Details  Name: Christy May MRN: [id number] Date of Birth: 1951/05/26  Transition of Care [facility]) CM/SW Contact:    Dessa Phi, RN Phone Number: 05/10/2019, 2:43 PM  Clinical Narrative:From home. Patient has gone to [address]-she declines to go back.Active w/[facility] rep Santiago Glad following. PT/OT recc SNF.Patient in agreement to short term rehab.Patient defers to her brother Christy May-TC Christy informed fo PT recc SNF-he is also in agreement to faxing out but not to [facility].                  Expected Discharge Plan: Skilled Nursing Facility Barriers to Discharge: Continued Medical Work up   Patient Goals and CMS Choice Patient states their goals for this hospitalization and ongoing recovery are:: go home      Expected Discharge Plan and Services Expected Discharge Plan: [place]   Discharge Planning Services: CM Consult Post Acute Care Choice: Blue Living arrangements for the past 2 months: Single Family Home                                      Prior Living Arrangements/Services Living arrangements for the past 2 months: Single Family Home Lives with:: Siblings Patient language and need for interpreter reviewed:: Yes Do you feel safe going back to the place where you live?: Yes      Need for Family Participation in Patient Care: No (Comment) Care giver support system in place?: Yes (comment) Current home services: Homehealth aide, Home OT, Home PT, Home RN, Other (comment)(Active w/[facility]-RN/PT/OT/aide/sw) Criminal Activity/Legal Involvement Pertinent to Current Situation/Hospitalization: No - Comment as needed  Activities of Daily Living Home Assistive Devices/Equipment: Wheelchair ADL Screening (condition at time of admission) Patient's cognitive ability adequate to safely complete daily activities?: Yes Is the patient deaf or have difficulty hearing?: No Does the  patient have difficulty seeing, even when wearing glasses/contacts?: No Does the patient have difficulty concentrating, remembering, or making decisions?: No Patient able to express need for assistance with ADLs?: Yes Does the patient have difficulty dressing or bathing?: Yes Independently performs ADLs?: No Communication: Needs assistance Is this a change from baseline?: Pre-admission baseline Dressing (OT): Needs assistance Is this a change from baseline?: Pre-admission baseline Grooming: Needs assistance Is this a change from baseline?: Pre-admission baseline Feeding: Independent Bathing: Needs assistance Is this a change from baseline?: Pre-admission baseline Toileting: Needs assistance Is this a change from baseline?: Pre-admission baseline In/Out Bed: Needs assistance Does the patient have difficulty walking or climbing stairs?: Yes Weakness of Legs: None Weakness of Arms/Hands: None  Permission Sought/Granted Permission sought to share information with : Case Manager Permission granted to share information with : Yes, Verbal Permission Granted  Share Information with NAME: Christy May     Permission granted to share info w Relationship: Brother  Permission granted to share info w Contact Information: [phone]  Emotional Assessment Appearance:: Appears stated age Attitude/Demeanor/Rapport: Gracious Affect (typically observed): Accepting Orientation: : Oriented to Self, Oriented to Place, Oriented to  Time, Oriented to Situation Alcohol / Substance Use: Not Applicable Psych Involvement: No (comment)  Admission diagnosis:  Sepsis [facility]) [A41.9] Patient Active Problem List   Diagnosis Date Noted  . Sepsis ([place]) 05/08/2019  . Hypokalemia 05/08/2019  . ARF (acute renal failure) ([place]) 04/24/2019  . AMS (altered mental status) 04/24/2019  . Fungal  skin infection 03/25/2018  . Thoracic spondylosis with myelopathy 02/17/2018  . Spondylosis, thoracic, with myelopathy  02/17/2018  . Dehydration 02/11/2018  . AKI (acute kidney injury) ([place]) 02/11/2018  . Hypotension 02/11/2018  . Syncope, vasovagal 02/11/2018  . Syncope 02/11/2018  . DM neuropathy, type II diabetes mellitus ([place]) 11/18/2017  . Weakness 11/18/2017  . SIRS (systemic inflammatory response syndrome) ([place]) 11/17/2017  . Diabetes mellitus type 2 in obese ([place]) 11/17/2017  . Acute lower UTI 11/17/2017  . Acute renal failure (ARF) ([place]) 08/13/2017  . Pressure injury of skin 08/13/2017  . Sepsis due to gram-negative UTI ([place]) 08/12/2017  . Upper airway cough syndrome 09/28/2016  . Acute bronchitis 10/29/2015  . Diabetes ([place]) 08/21/2015  . Chest pain 07/14/2015  . Dyspnea on exertion 07/05/2015  . Obstructive sleep apnea 07/05/2015  . Constipation 03/05/2015  . Allergic rhinitis 03/05/2015  . Hearing loss 03/05/2015  . Vocal cord dysfunction 03/05/2015  . Pain in joint, shoulder region 09/11/2014  . Weakness generalized 09/02/2014  . Acute esophagitis 05/08/2014  . Benign neoplasm of sigmoid colon 05/08/2014  . Change in bowel habits 04/17/2014  . Fecal incontinence 04/17/2014  . Urge incontinence 04/08/2014  . Cough 12/06/2013  . Orthostasis 07/18/2013  . Hyperkalemia 07/18/2013  . Acute kidney injury ([place]) 07/18/2013  . Bilateral carpal tunnel syndrome 02/14/2013  . Gout 01/04/2013  . Failure to thrive 06/28/2012  . Gait disorder 06/28/2012  . Lower abdominal pain 12/14/2011  . Preventative health care 08/23/2011  . Cervical radiculopathy 02/11/2011  . Low back pain 02/11/2011  . VITAMIN D DEFICIENCY 11/22/2009  . ANEMIA-NOS 11/22/2009  . DEGENERATIVE JOINT DISEASE, CERVICAL SPINE 11/22/2009  . Depression 09/16/2009  . POLYNEUROPATHY 09/16/2009  . Morbid obesity ([place]) 09/09/2009  . Essential hypertension 09/09/2009  . GERD 09/09/2009  . Sleep apnea 09/09/2009   PCP:  Lucianne Lei, MD Pharmacy:   [facility] [address], [place] [address] Phone: [phone] Fax: [facility], [place] [address] Phone: [phone] Fax: [phone]     Social Determinants of Health (SDOH) Interventions    Readmission Risk Interventions No flowsheet data found.

## 2019-05-10 NOTE — Progress Notes (Signed)
PHARMACIST - PHYSICIAN COMMUNICATION  CONCERNING: Antibiotic IV to Oral Route Change Policy  RECOMMENDATION: This patient is receiving azithromycin by the intravenous route.  Based on criteria approved by the Pharmacy and Therapeutics Committee, the antibiotic(s) is/are being converted to the equivalent oral dose form(s).   DESCRIPTION: These criteria include:  Patient being treated for a respiratory tract infection, urinary tract infection, cellulitis or clostridium difficile associated diarrhea if on metronidazole  The patient is not neutropenic and does not exhibit a GI malabsorption state  The patient is eating (either orally or via tube) and/or has been taking other orally administered medications for a least 24 hours  The patient is improving clinically and has a Tmax < 100.5  If you have questions about this conversion, please contact the Pharmacy Department  []   [phone] )  [facility] []   [phone] )  [facility] []   [phone] )  [facility] []   [phone] )  [facility] [x]   [phone] )  [place], [place].D 05/10/2019 1:36 PM

## 2019-05-10 NOTE — Progress Notes (Signed)
PROGRESS NOTE    Christy May  [id number] DOB: 1951/01/04 DOA: 05/08/2019 PCP: Lucianne Lei, MD   Brief Narrative:  Patient is a 35 female with history of morbid obesity, insulin-dependent diabetes type 2, low back pain, neuropathy/diabetic nephropathy, OSA on CPAP, COPD, chronic bronchitis on low-dose of steroids, vitamin D deficiency, GERD, headaches, hypertension, depression, gout who presents to the emergency department with complaints of altered mental status.  She was recently discharged from [facility].  Patient was recently admitted on 11/2 with similar complaint when she was found to be confused and it was thought to be secondary to AKI superimposed with polypharmacy.  She was discharged to [facility] on 04/26/19.  At that time gabapentin dose was reduced. On presentation she was found to be tachycardic.  Found to have acute kidney injury with creatinine of 1.9.  Her baseline creatinine is 1-1.1.  UA was impressive for urinary tract infection so urine culture sent.  Chest x-ray showed streaky opacities in the bases.  Urine showed a lot of ketones, beta hydroxybutyrate level positive in serum.  Started on antibiotics for possible CAP and insulin drip for DKA. Gap has closed now and insulin drip has been stopped. Mental status has improved.  Assessment & Plan:   Principal Problem:   Sepsis ([place]) Active Problems:   Gout   Acute kidney injury ([place])   Diabetes mellitus type 2 in obese (H[facility])   Hypokalemia   Suspected sepsis secondary to possible community-acquired pneumonia: Currently hemodynamically stable.  Not hypoxic.  Chest x-ray showed streaky opacities on the bases.  Continue current antibiotics.  Covid test 19 negative. Follow-up cultures.  Saturating fine on room air  DKA: Elevated anion gap on presentation.  Blood glucose not that high.  Last hemoglobin A1c was 6.7 on 10/22.  On steroids for chronic bronchitis.    Positive beta hydroxybutyrate level and ketones in  urine.  Diabetic coordinator following.  We discontinued insulin drip and started on Lantus and sliding scale.  Patient says she ran out of insulin at home.  AKI on CKD stage III -III: Prerenal due to dehydration, sepsis.  Also on home diuretics and ARB.  She looked dehydrated on presentation.  Continue gentlt IV hydration .  Hold irbesartan and hydrochlorothiazide.  Kidney function improving with IV fluids.  Altered mental status: Secondary to acute metabolic encephalopathy from possible DKA, pneumonia, polypharmacy.  On gabapentin, Norco, Flexeril at home which could have contributed.  Much improved mental status today.  She is alert and oriented but has slow speech. blood ethanol, ammonia level negative.  Head CT negative for any acute intracranial abnormalities.  Continue to monitor mental status.  Hypokalemia/hypomagnesemia: Being supplemented  Abnormal EKG: EKG showed T wave inversions in inferior and lateral leads.  Denies any chest pain.  High sensitive troponin negative  Chronic bronchitis: Currently stable.  On prednisone 20 mg daily at home which is on hold.  Gout: On allopurinol  Chronic constipation: Continue bowel regimen  Debility/deconditioning: She was recently placed in a skilled nursing facility and was discharged home.  Looks like she is not that mobile.  She has history of spinal stenosis.  She has not walking the past several years and had several falls.  Waiting for PT/OT evaluation            DVT prophylaxis: Lovenox Code Status: Full Family Communication: Called daughter on phone  Disposition Plan: Hopeful for SNF. Waiting for PT/OT evaluation.  Patient not medically ready.   Consultants: None  Procedures: None  Antimicrobials:  Anti-infectives (From admission, onward)   Start     Dose/Rate Route Frequency Ordered Stop   05/09/19 2000  cefTRIAXone (ROCEPHIN) 1 g in sodium chloride 0.9 % 100 mL IVPB     1 g 200 mL/hr over 30 Minutes Intravenous Every  24 hours 05/08/19 2236     05/09/19 2000  azithromycin (ZITHROMAX) 500 mg in sodium chloride 0.9 % 250 mL IVPB     500 mg 250 mL/hr over 60 Minutes Intravenous Every 24 hours 05/08/19 2236     05/08/19 2045  cefTRIAXone (ROCEPHIN) 1 g in sodium chloride 0.9 % 100 mL IVPB     1 g 200 mL/hr over 30 Minutes Intravenous  Once 05/08/19 2042 05/08/19 2309   05/08/19 2045  azithromycin (ZITHROMAX) 500 mg in sodium chloride 0.9 % 250 mL IVPB     500 mg 250 mL/hr over 60 Minutes Intravenous  Once 05/08/19 2042 05/09/19 0210      Subjective:  Patient seen and examined the bedside this morning.  Appears much better today.  Alert, awake and oriented but has slow speech and speaks very slowly.  Says that she is feeling better. objective: Vitals:   05/09/19 0855 05/09/19 1334 05/09/19 2140 05/10/19 0546  BP: 121/67 119/71 (!) 103/59 111/67  Pulse: (!) 115 (!) 115 (!) 111 (!) 113  Resp:  14    Temp:  98.9 F (37.2 C)  99.2 F (37.3 C)  TempSrc:  Oral  Oral  SpO2: 100% 99% 98% 95%  Weight:      Height:        Intake/Output Summary (Last 24 hours) at 05/10/2019 1129 Last data filed at 05/09/2019 2200 Gross per 24 hour  Intake 1519.12 ml  Output 1000 ml  Net 519.12 ml   Filed Weights   05/09/19 0351  Weight: 125.7 kg    Examination:  General exam: Morbidly obese, generalized weakness HEENT:PERRL,Oral mucosa dry Respiratory system: Diminished air entry on the bases Cardiovascular system: S1 & S2 heard, RRR. No JVD, murmurs, rubs, gallops or clicks. No pedal edema. Gastrointestinal system: Abdomen is nondistended, soft and nontender. No organomegaly or masses felt. Normal bowel sounds heard. Central nervous system: Alert and awake , oriented, slow speech Extremities: No edema, no clubbing ,no cyanosis, distal peripheral pulses palpable. Skin: No rashes, lesions or ulcers,no icterus ,no pallor   Data Reviewed: I have personally reviewed following labs and imaging studies  CBC:  Recent Labs  Lab 05/08/19 1624  WBC 8.8  NEUTROABS 7.1  HGB 13.7  HCT 44.0  MCV 93.2  PLT 99991111   Basic Metabolic Panel: Recent Labs  Lab 05/08/19 1624 05/09/19 0144 05/09/19 1309 05/10/19 0759  NA 136 134* 137 134*  K 3.3* 5.4* 3.3* 2.9*  CL 96* 98 100 102  CO2 19* 17* 23 18*  GLUCOSE 273* 231* 234* 157*  BUN 18 20 20 15   CREATININE 1.94* 2.03* 1.75* 1.11*  CALCIUM 9.7 9.4 8.8* 8.5*  MG  --  1.9  --  1.6*   GFR: Estimated Creatinine Clearance: 62.8 mL/min (A) (by C-G formula based on SCr of 1.11 mg/dL (H)). Liver Function Tests: Recent Labs  Lab 05/08/19 1624  AST 12*  ALT 29  ALKPHOS 82  BILITOT 1.2  PROT 7.2  ALBUMIN 3.1*   No results for input(s): LIPASE, AMYLASE in the last 168 hours. Recent Labs  Lab 05/08/19 1624  AMMONIA <9*   Coagulation Profile: No results for input(s): INR, PROTIME in  the last 168 hours. Cardiac Enzymes: Recent Labs  Lab 05/08/19 1654  CKTOTAL 72   BNP (last 3 results) No results for input(s): PROBNP in the last 8760 hours. HbA1C: No results for input(s): HGBA1C in the last 72 hours. CBG: Recent Labs  Lab 05/09/19 1137 05/09/19 1356 05/09/19 1734 05/09/19 2138 05/10/19 0750  GLUCAP 205* 215* 195* 205* 146*   Lipid Profile: No results for input(s): CHOL, HDL, LDLCALC, TRIG, CHOLHDL, LDLDIRECT in the last 72 hours. Thyroid Function Tests: Recent Labs    05/09/19 0144  TSH 3.256   Anemia Panel: No results for input(s): VITAMINB12, FOLATE, FERRITIN, TIBC, IRON, RETICCTPCT in the last 72 hours. Sepsis Labs: Recent Labs  Lab 05/08/19 1624  LATICACIDVEN 1.5    Recent Results (from the past 240 hour(s))  Blood culture (routine x 2)     Status: None (Preliminary result)   Collection Time: 05/08/19  4:18 PM   Specimen: BLOOD  Result Value Ref Range Status   Specimen Description   Final    BLOOD LEFT ANTECUBITAL Performed at [facility], [place] [address] [place], [place] [address]    Special Requests    Final    BOTTLES DRAWN AEROBIC AND ANAEROBIC Blood Culture adequate volume Performed at [facility] [address] [place], [place] [address]    Culture   Final    NO GROWTH < 24 HOURS Performed at [facility] [address], [place], [place] [address]    Report Status PENDING  Incomplete  Blood culture (routine x 2)     Status: None (Preliminary result)   Collection Time: 05/08/19  4:30 PM   Specimen: Right Antecubital; Blood  Result Value Ref Range Status   Specimen Description   Final    RIGHT ANTECUBITAL Performed at [facility] [address] [place], [place] [address]    Special Requests   Final    BOTTLES DRAWN AEROBIC AND ANAEROBIC Blood Culture adequate volume Performed at [facility] [address], [place], [place] [address]    Culture   Final    NO GROWTH < 24 HOURS Performed at [facility] [address] [place], [place] [address]    Report Status PENDING  Incomplete  SARS CORONAVIRUS 2 (TAT 6-24 HRS) Nasopharyngeal Nasopharyngeal Swab     Status: None   Collection Time: 05/08/19  4:49 PM   Specimen: Nasopharyngeal Swab  Result Value Ref Range Status   SARS Coronavirus 2 NEGATIVE NEGATIVE Final    Comment: (NOTE) SARS-CoV-2 target nucleic acids are NOT DETECTED. The SARS-CoV-2 RNA is generally detectable in upper and lower respiratory specimens during the acute phase of infection. Negative results do not preclude SARS-CoV-2 infection, do not rule out co-infections with other pathogens, and should not be used as the sole basis for treatment or other patient management decisions. Negative results must be combined with clinical observations, patient history, and epidemiological information. The expected result is Negative. Fact Sheet for Patients: SugarRoll.be Fact Sheet for Healthcare Providers: https://www.woods-mathews.com/ This test is  not yet approved or cleared by the [place] FDA and  has been authorized for detection and/or diagnosis of SARS-CoV-2 by FDA under an Emergency Use Authorization (EUA). This EUA will remain  in effect (meaning this test can be used) for the duration of the COVID-19 declaration under Section 56 4(b)(1) of the Act, 21 U.S.C. section 360bbb-3(b)(1), unless the authorization is terminated or revoked sooner. Performed at [facility]  [facility], [place] [address] [place], [place] [address]   Urine culture     Status: None   Collection Time: 05/08/19 11:50 PM   Specimen: Urine, Random  Result Value Ref Range Status   Specimen Description   Final    URINE, RANDOM Performed at [facility] [address], [place], [place] [address]    Special Requests   Final    NONE Performed at [facility], [place] [address], [place], [place] [address]    Culture   Final    NO GROWTH Performed at [facility], [place] [address] [place], [place] [address]    Report Status 05/09/2019 FINAL  Final         Radiology Studies: Ct Head Wo Contrast  Result Date: 05/08/2019 CLINICAL DATA:  Altered mental status EXAM: CT HEAD WITHOUT CONTRAST TECHNIQUE: Contiguous axial images were obtained from the base of the skull through the vertex without intravenous contrast. COMPARISON:  CT brain 04/24/2019 FINDINGS: Brain: Mild motion degradation. No acute territorial infarction, hemorrhage, or intracranial mass. Mild small vessel ischemic changes of the white matter. Mild atrophy. Stable ventricle size Vascular: No hyperdense vessels. Scattered calcifications at the carotid siphons. Skull: Normal. Negative for fracture or focal lesion. Sinuses/Orbits: No acute finding. Other: None IMPRESSION: 1. Mild motion degraded study. 2. No CT evidence for acute intracranial abnormality. Mild atrophy and small vessel ischemic changes of the white matter. Electronically Signed   By: Donavan Foil M.D.   On: 05/08/2019 17:08   Dg Chest Portable 1 View  Result Date: 05/08/2019 CLINICAL DATA:  Altered mental status EXAM: PORTABLE CHEST 1 VIEW COMPARISON:  Radiograph 04/24/2019 FINDINGS: Lung volumes are persistently low with streaky opacities in the bases favoring atelectasis though underlying infection could have a similar appearance. No pneumothorax. No effusion. Stable cardiomediastinal contours with mild cardiomegaly. Cervical fixation hardware is partially visualized. No acute osseous or soft tissue abnormality. IMPRESSION: Persistently low lung volumes with streaky opacities in the bases, favoring atelectasis though underlying infection could have a similar appearance. Stable cardiomegaly. Electronically Signed   By: Lovena Le M.D.   On: 05/08/2019 19:10        Scheduled Meds: . allopurinol  300 mg Oral Daily  . benzonatate  100 mg Oral QHS  . cholecalciferol  2,000 Units Oral Daily  . cycloSPORINE  1 drop Both Eyes BID  . docusate sodium  100 mg Oral BID  . enoxaparin (LOVENOX) injection  60 mg Subcutaneous Q24H  . fluticasone  2 spray Each Nare Daily  . guaiFENesin  600 mg Oral BID  . insulin aspart  0-15 Units Subcutaneous TID WC  . insulin aspart  0-5 Units Subcutaneous QHS  . insulin glargine  30 Units Subcutaneous Daily  . lidocaine  1 patch Transdermal BID  . loratadine  10 mg Oral Daily  . montelukast  10 mg Oral Daily  . multivitamin with minerals  1 tablet Oral QHS  . oxybutynin  15 mg Oral QPM  . pantoprazole  40 mg Oral BID  . polycarbophil  625 mg Oral BID  . polyethylene glycol  17 g Oral Daily  . senna  1 tablet Oral QHS  . sodium chloride  2 spray Each Nare BID  . triamcinolone cream  1 application Topical BID   Continuous Infusions: . sodium chloride 125 mL/hr at 05/09/19 1429  . azithromycin 500 mg (05/09/19 2150)  . cefTRIAXone (ROCEPHIN)  IV 1 g (05/09/19  2049)  . potassium chloride       LOS: 2 days    Time spent: 25 mins.More  than 50% of that time was spent in counseling and/or coordination of care.      Shelly Coss, MD [facility] Pager [phone]  If 7PM-7AM, please contact night-coverage [email or web address] Password [facility] 05/10/2019, 11:29 AM

## 2019-05-10 NOTE — Evaluation (Signed)
Occupational Therapy Evaluation Patient Details Name: Christy May MRN: [id number] DOB: 1950/12/02 Today's Date: 05/10/2019    History of Present Illness Christy May is a 68 y.o. 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  Per triage note, patient was recently discharged from [facility].  She has a home health nurse that checks on her once weekly.   Clinical Impression   Pt was admitted for the above. She was unable to state what she could do for herself for adls and mobility after she was discharged from [facility]; her sister had been staying with her.  Pt needs extensive assistance for most adls; she is not strong enough to attempt standing. Will follow in acute setting with min to mod A.    Follow Up Recommendations  SNF    Equipment Recommendations  (defer to next venue)    Recommendations for Other Services       Precautions / Restrictions Precautions Precautions: Fall Precaution Comments: pressure sore on buttock Restrictions Weight Bearing Restrictions: No      Mobility Bed Mobility Overal bed mobility: Needs Assistance Bed Mobility: Rolling;Sidelying to Sit;Sit to Sidelying Rolling: Mod assist;+2 for physical assistance Sidelying to sit: Mod assist;+2 for physical assistance     Sit to sidelying: Mod assist;+2 for physical assistance General bed mobility comments: Sat EOB for approx 10 mins with min guard assist after getting feet supported. Pt able to assist with functional task sitting EOB with min guard assist. Pt did a lateral scoot up into the bed with +2 total max assist.  Transfers                  General transfer comment: Pt unable at this time to complete a transfer secondary to degree of LE weakness    Balance Overall balance assessment: Needs assistance Sitting-balance support: Bilateral upper extremity supported;Feet supported Sitting balance-Leahy Scale: Fair                                     ADL either performed or assessed with clinical judgement   ADL Overall ADL's : Needs assistance/impaired Eating/Feeding: Set up   Grooming: Set up;Wash/dry face;Sitting Grooming Details (indicate cue type and reason): pt held wash cloth with both hands and moved face around cloth Upper Body Bathing: Moderate assistance   Lower Body Bathing: Total assistance;+2 for safety/equipment;Bed level   Upper Body Dressing : Moderate assistance   Lower Body Dressing: Total assistance;+2 for safety/equipment;Bed level       Toileting- Clothing Manipulation and Hygiene: Total assistance;+2 for safety/equipment;Bed level         General ADL Comments: pt assisted to EOB; performed hygiene at bed level due to incontinence x bowel     Vision         Perception     Praxis      Pertinent Vitals/Pain Pain Assessment: Faces Faces Pain Scale: Hurts little more Pain Location: chronic upper and low back pain Pain Descriptors / Indicators: Discomfort Pain Intervention(s): Limited activity within patient's tolerance;Monitored during session;Repositioned     Hand Dominance     Extremity/Trunk Assessment Upper Extremity Assessment Upper Extremity Assessment:  Generalized weakness   Lower Extremity Assessment Lower Extremity Assessment: Generalized weakness RLE Deficits / Details: R LE 2/5, RLE Sensation: history of peripheral neuropathy LLE Deficits / Details: L LE 2-3/5 LLE Sensation: history of peripheral neuropathy       Communication Communication Communication: No difficulties   Cognition Arousal/Alertness: Awake/alert Behavior During Therapy:  WFL for tasks assessed/performed Overall Cognitive Status: No family/caregiver present to determine baseline cognitive functioning                                 General Comments: not able to provide a good recall of prior functional level. some difficulty with memory and slow processing.   General Comments  after returning pt back to bed noticed she had a BM. Nursing tech assisted with self care and pt completed rolling with +2 mod assist. Discovered pt had a pressure sore on her Right buttock and RN was called and aware.    Exercises     Shoulder Instructions      Home Living Family/patient expects to be discharged to:: Unsure Living Arrangements: Other relatives Available Help at Discharge: Personal care attendant;Available PRN/intermittently Type of Home: House Home Access: Stairs to enter Entrance Stairs-Number of Steps: 1   Home Layout: One level               Home Equipment: Walker - 2 wheels;Walker - 4 wheels;Cane - single point;Bedside commode   Additional Comments: pt did not answer all PLOF questions.  Sister was staying with her, and per chart, sister has had a CVA      Prior Functioning/Environment Level of Independence: Needs assistance  Gait / Transfers Assistance Needed: per patient unable to ambulate     Comments: no family available to get an accurate history. Pt stated she hasnt walked in over a year. She uses a w/c, but  she is unable to state if she transfers into it with help.        OT Problem List: Decreased strength;Decreased activity tolerance;Decreased cognition;Decreased knowledge of use of DME or AE;Pain;Obesity      OT Treatment/Interventions: Self-care/ADL training;Therapeutic exercise;Energy conservation;DME and/or AE instruction;Patient/family education;Therapeutic activities;Cognitive remediation/compensation(balance prn)    OT Goals(Current goals can be found in the care plan section) Acute Rehab OT Goals Patient  Stated Goal: To go home OT Goal Formulation: With patient Time For Goal Achievement: 05/24/19 Potential to Achieve Goals: Good ADL Goals Additional ADL Goal #1: pt will perform UB adls with min A from eob Additional ADL Goal #2: pt will perform lateral scoot to drop arm commode with mod +2 assist Additional ADL Goal #3: pt will perform bed mobility with min +2 assist in preparation for adls Additional ADL Goal #4: pt will perform level one theraband HEP with supervision  OT Frequency: Min 2X/week   Barriers to D/C:            Co-evaluation PT/OT/SLP Co-Evaluation/Treatment: Yes Reason for Co-Treatment: For patient/therapist safety   OT goals addressed during session: ADL's and self-care;Strengthening/ROM      AM-PAC OT "6 Clicks" Daily Activity     Outcome Measure Help from another person eating meals?: A Little Help from another person taking care of personal grooming?: A Little Help from another person toileting, which includes using toliet, bedpan, or urinal?: Total Help from another person bathing (including washing, rinsing, drying)?: A Lot Help from another person to put on and taking off regular upper body clothing?:  A Lot Help from another person to put on and taking off regular lower body clothing?: Total 6 Click Score: 12   End of Session    Activity Tolerance: Patient tolerated treatment well Patient left: in bed;with call bell/phone within reach;with nursing/sitter in room  OT Visit Diagnosis: Muscle weakness (generalized) (M62.81)                Time: [id number] OT Time Calculation (min): 33 min Charges:  OT General Charges $OT Visit: 1 Visit OT Evaluation $OT Eval Low Complexity: [place], OTR/L Acute Rehabilitation Services [phone] [facility] pager [phone] office 05/10/2019  [place] 05/10/2019, 1:43 PM

## 2019-05-10 NOTE — Plan of Care (Signed)
  Problem: Clinical Measurements: Goal: Diagnostic test results will improve Outcome: Progressing Goal: Respiratory complications will improve Outcome: Progressing   Problem: Safety: Goal: Ability to remain free from injury will improve Outcome: Progressing   Problem: Skin Integrity: Goal: Risk for impaired skin integrity will decrease Outcome: Progressing   

## 2019-05-10 NOTE — NC FL2 (Addendum)
[place] MEDICAID FL2 LEVEL OF CARE SCREENING TOOL     IDENTIFICATION  Patient Name: Christy May Birthdate: 10-Aug-1950 Sex: female Admission Date (Current Location): 05/08/2019  [facility] and [place] Number:  Herbalist and Address:  [facility],  [place] [address] [place]      Provider Number: [id number]  Attending Physician Name and Address:  Shelly Coss, MD  Relative Name and Phone Number:  BJ [phone]    Current Level of Care: Hospital Recommended Level of Care: [place] Prior Approval Number:    Date Approved/Denied:   PASRR Number: [id number]  Discharge Plan: SNF    Current Diagnoses: Patient Active Problem List   Diagnosis Date Noted  . Sepsis ([place]) 05/08/2019  . Hypokalemia 05/08/2019  . ARF (acute renal failure) ([place]) 04/24/2019  . AMS (altered mental status) 04/24/2019  . Fungal skin infection 03/25/2018  . Thoracic spondylosis with myelopathy 02/17/2018  . Spondylosis, thoracic, with myelopathy 02/17/2018  . Dehydration 02/11/2018  . AKI (acute kidney injury) ([place]) 02/11/2018  . Hypotension 02/11/2018  . Syncope, vasovagal 02/11/2018  . Syncope 02/11/2018  . DM neuropathy, type II diabetes mellitus ([place]) 11/18/2017  . Weakness 11/18/2017  . SIRS (systemic inflammatory response syndrome) ([place]) 11/17/2017  . Diabetes mellitus type 2 in obese ([place]) 11/17/2017  . Acute lower UTI 11/17/2017  . Acute renal failure (ARF) ([place]) 08/13/2017  . Pressure injury of skin 08/13/2017  . Sepsis due to gram-negative UTI ([place]) 08/12/2017  . Upper airway cough syndrome 09/28/2016  . Acute bronchitis 10/29/2015  . Diabetes ([place]) 08/21/2015  . Chest pain 07/14/2015  . Dyspnea on exertion 07/05/2015  . Obstructive sleep apnea 07/05/2015  . Constipation 03/05/2015  . Allergic rhinitis 03/05/2015  . Hearing loss 03/05/2015  . Vocal cord dysfunction 03/05/2015  . Pain in joint, shoulder region  09/11/2014  . Weakness generalized 09/02/2014  . Acute esophagitis 05/08/2014  . Benign neoplasm of sigmoid colon 05/08/2014  . Change in bowel habits 04/17/2014  . Fecal incontinence 04/17/2014  . Urge incontinence 04/08/2014  . Cough 12/06/2013  . Orthostasis 07/18/2013  . Hyperkalemia 07/18/2013  . Acute kidney injury ([place]) 07/18/2013  . Bilateral carpal tunnel syndrome 02/14/2013  . Gout 01/04/2013  . Failure to thrive 06/28/2012  . Gait disorder 06/28/2012  . Lower abdominal pain 12/14/2011  . Preventative health care 08/23/2011  . Cervical radiculopathy 02/11/2011  . Low back pain 02/11/2011  . VITAMIN D DEFICIENCY 11/22/2009  . ANEMIA-NOS 11/22/2009  . DEGENERATIVE JOINT DISEASE, CERVICAL SPINE 11/22/2009  . Depression 09/16/2009  . POLYNEUROPATHY 09/16/2009  . Morbid obesity ([place]) 09/09/2009  . Essential hypertension 09/09/2009  . GERD 09/09/2009  . Sleep apnea 09/09/2009    Orientation RESPIRATION BLADDER Height & Weight     Self  O2(2L) Incontinent Weight: 277 lb 1.9 oz (125.7 kg) Height:  5\' 4"  (162.6 cm)  BEHAVIORAL SYMPTOMS/MOOD NEUROLOGICAL BOWEL NUTRITION STATUS      Incontinent Diet(carb modified)  AMBULATORY STATUS COMMUNICATION OF NEEDS Skin   Extensive Assist Verbally                         Personal Care Assistance Level of Assistance  Bathing, Feeding, Dressing Bathing Assistance: Limited assistance Feeding assistance: Limited assistance Dressing Assistance: Limited assistance Total Care Assistance: Maximum assistance   Functional Limitations Info  Hearing, Speech, Sight Sight Info: Adequate Hearing Info: Adequate Speech Info: Adequate    SPECIAL CARE FACTORS FREQUENCY  PT (  By licensed PT), OT (By licensed OT)     PT Frequency: 5x wk OT Frequency: 5x wk            Contractures Contractures Info: Not present    Additional Factors Info  Code Status, Allergies, Isolation Precautions, Insulin Sliding Scale Code Status Info:  full code Allergies Info: Ace Inhibitors Penicillins Adhesive (Tape)   Insulin Sliding Scale Info: novolog Isolation Precautions Info: mrsa     Current Medications (05/10/2019):  This is the current hospital active medication list Current Facility-Administered Medications  Medication Dose Route Frequency Provider Last Rate Last Dose  . 0.9 %  sodium chloride infusion   Intravenous Continuous Shelly Coss, MD 125 mL/hr at 05/09/19 1429    . acetaminophen (TYLENOL) tablet 650 mg  650 mg Oral Q6H PRN Shela Leff, MD   650 mg at 05/10/19 0518   Or  . acetaminophen (TYLENOL) suppository 650 mg  650 mg Rectal Q6H PRN Shela Leff, MD      . allopurinol (ZYLOPRIM) tablet 300 mg  300 mg Oral Daily Shela Leff, MD   300 mg at 05/10/19 1238  . azithromycin (ZITHROMAX) tablet 500 mg  500 mg Oral QHS Leodis Sias T, RPH      . benzonatate (TESSALON) capsule 100 mg  100 mg Oral QHS Shela Leff, MD      . cefTRIAXone (ROCEPHIN) 1 g in sodium chloride 0.9 % 100 mL IVPB  1 g Intravenous Q24H Shela Leff, MD 200 mL/hr at 05/09/19 2049 1 g at 05/09/19 2049  . cholecalciferol (VITAMIN D3) tablet 2,000 Units  2,000 Units Oral Daily Shela Leff, MD   2,000 Units at 05/10/19 1235  . cycloSPORINE (RESTASIS) 0.05 % ophthalmic emulsion 1 drop  1 drop Both Eyes BID Shela Leff, MD   1 drop at 05/10/19 1244  . docusate sodium (COLACE) capsule 100 mg  100 mg Oral BID Shela Leff, MD   100 mg at 05/10/19 1235  . enoxaparin (LOVENOX) injection 60 mg  60 mg Subcutaneous Q24H Shela Leff, MD   60 mg at 05/10/19 1241  . fluticasone (FLONASE) 50 MCG/ACT nasal spray 2 spray  2 spray Each Nare Daily Shela Leff, MD   2 spray at 05/10/19 1249  . guaiFENesin (MUCINEX) 12 hr tablet 600 mg  600 mg Oral BID Shela Leff, MD   600 mg at 05/10/19 1240  . insulin aspart (novoLOG) injection 0-15 Units  0-15 Units Subcutaneous TID WC Shelly Coss, MD   2  Units at 05/10/19 0835  . insulin aspart (novoLOG) injection 0-5 Units  0-5 Units Subcutaneous QHS Shelly Coss, MD   2 Units at 05/09/19 2149  . insulin glargine (LANTUS) injection 30 Units  30 Units Subcutaneous Daily Shelly Coss, MD   30 Units at 05/10/19 1241  . lidocaine (LIDODERM) 5 % 1 patch  1 patch Transdermal BID Shela Leff, MD   1 patch at 05/10/19 1253  . loratadine (CLARITIN) tablet 10 mg  10 mg Oral Daily Shela Leff, MD   10 mg at 05/10/19 1238  . montelukast (SINGULAIR) tablet 10 mg  10 mg Oral Daily Shela Leff, MD   10 mg at 05/10/19 1238  . multivitamin with minerals tablet 1 tablet  1 tablet Oral QHS Shela Leff, MD      . oxybutynin (DITROPAN XL) 24 hr tablet 15 mg  15 mg Oral QPM Shela Leff, MD      . pantoprazole (PROTONIX) EC tablet 40 mg  40 mg Oral  BID Shela Leff, MD   40 mg at 05/10/19 1239  . polycarbophil (FIBERCON) tablet 625 mg  625 mg Oral BID Shela Leff, MD   625 mg at 05/10/19 1240  . polyethylene glycol (MIRALAX / GLYCOLAX) packet 17 g  17 g Oral Daily Shela Leff, MD   17 g at 05/10/19 1236  . potassium chloride 10 mEq in 100 mL IVPB  10 mEq Intravenous Q1 Hr x 6 Trenton Passow, MD      . senna (SENOKOT) tablet 8.6 mg  1 tablet Oral QHS Shela Leff, MD      . sodium bicarbonate tablet 650 mg  650 mg Oral TID Shelly Coss, MD   650 mg at 05/10/19 1240  . sodium chloride (OCEAN) 0.65 % nasal spray 2 spray  2 spray Each Nare BID Shela Leff, MD   2 spray at 05/10/19 1256  . sodium chloride flush (NS) 0.9 % injection 10-40 mL  10-40 mL Intracatheter PRN Shelly Coss, MD      . triamcinolone cream (KENALOG) 0.1 % 1 application  1 application Topical BID Shela Leff, MD   1 application at [phone]     Discharge Medications: Please see discharge summary for a list of discharge medications.  Relevant Imaging Results:  Relevant Lab Results:   Additional  Information SSN: [id number] cpap  Wende Neighbors, LCSW

## 2019-05-10 NOTE — Evaluation (Signed)
Physical Therapy Evaluation Patient Details Name: Christy May MRN: [id number] DOB: 12-25-1950 Today's Date: 05/10/2019   History of Present Illness  Christy May is a 68 y.o. 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  Per triage note, patient was recently discharged from [facility].  She has a home health nurse that checks on her once weekly.  Clinical Impression  Pt presents with significant dependencies in mobility and general weakness secondary to the above diagnosis. Pt is unable to safely transfer to a recliner without lift equipment due to level of LE weakness. Pt had difficulty recalling prior functional level. Pt stated she did not want to return to SNF facility. Pt reported her sister can assist. At this present time recommend SNF due to pt's sister recently had a CVA and at pt's current level of functioning I do feel family can provide level of care needed. Pt will continue to benefit from acute skilled PT to maximize mobility and Independence for next venue of care.    Follow Up Recommendations SNF    Equipment Recommendations  None recommended by PT    Recommendations for Other Services       Precautions / Restrictions Precautions Precautions: Fall Precaution Comments: pressure sore on buttock Restrictions Weight Bearing Restrictions: No      Mobility  Bed Mobility Overal bed mobility: Needs Assistance Bed Mobility: Rolling;Sidelying to Sit;Sit to Sidelying Rolling: Mod assist;+2 for physical assistance Sidelying to sit: Mod assist;+2 for physical assistance     Sit to sidelying: +2 for physical assistance;Mod  assist General bed mobility comments: Sat EOB for approx 10 mins with min guard assist after getting feet supported. Pt able to assist with functional task sitting EOB with min guard assist. Pt did a lateral scoot up into the bed with +2 total max assist.  Transfers                 General transfer comment: Pt unable at this time to complete a transfer secondary to degree of LE weakness  Ambulation/Gait                Stairs            Wheelchair Mobility    Modified Rankin (Stroke Patients Only)       Balance Overall balance assessment: Needs assistance Sitting-balance support: Bilateral upper extremity supported;Feet supported Sitting balance-Leahy Scale: Fair                                       Pertinent Vitals/Pain Faces Pain Scale: Hurts little more Pain Location: chronic upper and low back pain Pain Descriptors / Indicators: Discomfort Pain Intervention(s): Monitored during session;Repositioned    Home Living Family/patient expects to be discharged to:: Private residence Living Arrangements: Other relatives Available Help at Discharge: Personal care attendant;Available PRN/intermittently Type of Home: House Home Access: Stairs to enter   Entrance Stairs-Number of Steps: 1 Home Layout: One level Home Equipment: Walker - 2 wheels;Walker - 4 wheels;Cane - single point;Bedside commode      Prior Function Level of Independence: Needs assistance   Gait / Transfers Assistance Needed: per patient  unable to ambulate     Comments: no family available to get an accurate history. Pt stated she hasnt walked in over a year. She uses a w/c, but  she is unable to state if she transfers into it with help.     Hand Dominance        Extremity/Trunk Assessment   Upper Extremity Assessment Upper Extremity Assessment: Defer to OT evaluation    Lower Extremity Assessment Lower Extremity Assessment: Generalized weakness RLE Deficits /  Details: R LE 2/5, RLE Sensation: history of peripheral neuropathy LLE Deficits / Details: L LE 2-3/5 LLE Sensation: history of peripheral neuropathy       Communication   Communication: No difficulties  Cognition Arousal/Alertness: Awake/alert Behavior During Therapy: WFL for tasks assessed/performed Overall Cognitive Status: No family/caregiver present to determine baseline cognitive functioning                                 General Comments: not able to provide a good recall of prior functional level. some difficulty with memory and slow processing.      General Comments General comments (skin integrity, edema, etc.): after returning pt back to bed noticed she had a BM. Nursing tech assisted with self care and pt completed rolling with +2 mod assist. Discovered pt had a pressure sore on her Right buttock and RN was called and aware.    Exercises     Assessment/Plan    PT Assessment Patient needs continued PT services  PT Problem List Decreased strength;Decreased balance;Decreased range of motion;Decreased mobility;Decreased activity tolerance;Decreased safety awareness;Impaired sensation;Obesity;Decreased knowledge of precautions;Pain       PT Treatment Interventions DME instruction;Functional mobility training;Balance training;Patient/family education;Gait training;Therapeutic activities;Neuromuscular re-education;Wheelchair mobility training;Therapeutic exercise    PT Goals (Current goals can be found in the Care Plan section)  Acute Rehab PT Goals Patient Stated Goal: To go home PT Goal Formulation: With patient Time For Goal Achievement: 05/25/19 Potential to Achieve Goals: Fair    Frequency Min 2X/week   Barriers to discharge Decreased caregiver support      Co-evaluation PT/OT/SLP Co-Evaluation/Treatment: Yes Reason for Co-Treatment: For patient/therapist safety;To address functional/ADL transfers;Complexity of the patient's impairments  (multi-system involvement)           AM-PAC PT "6 Clicks" Mobility  Outcome Measure Help needed turning from your back to your side while in a flat bed without using bedrails?: Total Help needed moving from lying on your back to sitting on the side of a flat bed without using bedrails?: Total Help needed moving to and from a bed to a chair (including a wheelchair)?: Total Help needed standing up from a chair using your arms (e.g., wheelchair or bedside chair)?: Total Help needed to walk in hospital room?: Total Help needed climbing 3-5 steps with a railing? : Total 6 Click Score: 6    End of Session   Activity Tolerance: Patient tolerated treatment well Patient left: in bed;with call bell/phone within reach;with nursing/sitter in room Nurse Communication: Need for lift equipment PT Visit Diagnosis: Muscle weakness (generalized) (M62.81);Other abnormalities of gait and mobility (R26.89)    Time: 1035-1110 PT Time Calculation (min) (ACUTE ONLY): 35 min   Charges:   PT Evaluation $PT Eval Moderate Complexity: 1 Mod           Lelon Mast 05/10/2019, 11:33 AM

## 2019-05-11 DIAGNOSIS — R519 Headache, unspecified: Secondary | ICD-10-CM | POA: Diagnosis present

## 2019-05-11 DIAGNOSIS — F411 Generalized anxiety disorder: Secondary | ICD-10-CM | POA: Diagnosis present

## 2019-05-11 DIAGNOSIS — R278 Other lack of coordination: Secondary | ICD-10-CM | POA: Diagnosis present

## 2019-05-11 DIAGNOSIS — J449 Chronic obstructive pulmonary disease, unspecified: Secondary | ICD-10-CM | POA: Diagnosis present

## 2019-05-11 DIAGNOSIS — R531 Weakness: Secondary | ICD-10-CM | POA: Diagnosis not present

## 2019-05-11 DIAGNOSIS — I10 Essential (primary) hypertension: Secondary | ICD-10-CM | POA: Diagnosis not present

## 2019-05-11 DIAGNOSIS — E1110 Type 2 diabetes mellitus with ketoacidosis without coma: Secondary | ICD-10-CM | POA: Diagnosis present

## 2019-05-11 DIAGNOSIS — M6281 Muscle weakness (generalized): Secondary | ICD-10-CM | POA: Diagnosis present

## 2019-05-11 DIAGNOSIS — F329 Major depressive disorder, single episode, unspecified: Secondary | ICD-10-CM | POA: Diagnosis present

## 2019-05-11 DIAGNOSIS — J189 Pneumonia, unspecified organism: Secondary | ICD-10-CM | POA: Diagnosis present

## 2019-05-11 DIAGNOSIS — E559 Vitamin D deficiency, unspecified: Secondary | ICD-10-CM | POA: Diagnosis present

## 2019-05-11 DIAGNOSIS — F419 Anxiety disorder, unspecified: Secondary | ICD-10-CM | POA: Diagnosis not present

## 2019-05-11 DIAGNOSIS — K5900 Constipation, unspecified: Secondary | ICD-10-CM | POA: Diagnosis not present

## 2019-05-11 DIAGNOSIS — R6521 Severe sepsis with septic shock: Secondary | ICD-10-CM | POA: Diagnosis not present

## 2019-05-11 DIAGNOSIS — Z7952 Long term (current) use of systemic steroids: Secondary | ICD-10-CM | POA: Diagnosis not present

## 2019-05-11 DIAGNOSIS — R5381 Other malaise: Secondary | ICD-10-CM | POA: Diagnosis not present

## 2019-05-11 DIAGNOSIS — Z7401 Bed confinement status: Secondary | ICD-10-CM | POA: Diagnosis not present

## 2019-05-11 DIAGNOSIS — A419 Sepsis, unspecified organism: Secondary | ICD-10-CM | POA: Diagnosis present

## 2019-05-11 DIAGNOSIS — M797 Fibromyalgia: Secondary | ICD-10-CM | POA: Diagnosis not present

## 2019-05-11 DIAGNOSIS — G479 Sleep disorder, unspecified: Secondary | ICD-10-CM | POA: Diagnosis not present

## 2019-05-11 DIAGNOSIS — M2550 Pain in unspecified joint: Secondary | ICD-10-CM | POA: Diagnosis not present

## 2019-05-11 LAB — BASIC METABOLIC PANEL
Anion gap: 14 (ref 5–15)
BUN: 8 mg/dL (ref 8–23)
CO2: 20 mmol/L — ABNORMAL LOW (ref 22–32)
Calcium: 9.1 mg/dL (ref 8.9–10.3)
Chloride: 103 mmol/L (ref 98–111)
Creatinine, Ser: 0.93 mg/dL (ref 0.44–1.00)
GFR calc Af Amer: >60 mL/min (ref >60)
GFR calc non Af Amer: >60 mL/min (ref >60)
Glucose, Bld: 119 mg/dL — ABNORMAL HIGH (ref 70–99)
Potassium: 3.3 mmol/L — ABNORMAL LOW (ref 3.5–5.1)
Sodium: 137 mmol/L (ref 135–145)

## 2019-05-11 LAB — CBC WITH DIFFERENTIAL/PLATELET
Abs Immature Granulocytes: 0.02 10*3/uL (ref 0.00–0.07)
Basophils Absolute: 0 10*3/uL (ref 0.0–0.1)
Basophils Relative: 0 %
Eosinophils Absolute: 0.1 10*3/uL (ref 0.0–0.5)
Eosinophils Relative: 2 %
HCT: 37.8 % (ref 36.0–46.0)
Hemoglobin: 11.8 g/dL — ABNORMAL LOW (ref 12.0–15.0)
Immature Granulocytes: 0 %
Lymphocytes Relative: 24 %
Lymphs Abs: 1.4 10*3/uL (ref 0.7–4.0)
MCH: 29.5 pg (ref 26.0–34.0)
MCHC: 31.2 g/dL (ref 30.0–36.0)
MCV: 94.5 fL (ref 80.0–100.0)
Monocytes Absolute: 0.6 10*3/uL (ref 0.1–1.0)
Monocytes Relative: 11 %
Neutro Abs: 3.5 10*3/uL (ref 1.7–7.7)
Neutrophils Relative %: 63 %
Platelets: 176 10*3/uL (ref 150–400)
RBC: 4 MIL/uL (ref 3.87–5.11)
RDW: 16.1 % — ABNORMAL HIGH (ref 11.5–15.5)
WBC: 5.6 10*3/uL (ref 4.0–10.5)
nRBC: 0 % (ref 0.0–0.2)

## 2019-05-11 LAB — GLUCOSE, CAPILLARY
Glucose-Capillary: 130 mg/dL — ABNORMAL HIGH (ref 70–99)
Glucose-Capillary: 175 mg/dL — ABNORMAL HIGH (ref 70–99)
Glucose-Capillary: 204 mg/dL — ABNORMAL HIGH (ref 70–99)

## 2019-05-11 LAB — MAGNESIUM: Magnesium: 1.5 mg/dL — ABNORMAL LOW (ref 1.7–2.4)

## 2019-05-11 MED ORDER — POTASSIUM CHLORIDE CRYS ER 20 MEQ PO TBCR
40.0000 meq | EXTENDED_RELEASE_TABLET | Freq: Once | ORAL | Status: AC
  Administered 2019-05-11: 10:00:00 40 meq via ORAL
  Filled 2019-05-11: qty 2

## 2019-05-11 MED ORDER — POTASSIUM CHLORIDE ER 20 MEQ PO TBCR: 20.0000 meq | EXTENDED_RELEASE_TABLET | Freq: Every day | ORAL | Status: AC

## 2019-05-11 MED ORDER — METOPROLOL TARTRATE 25 MG PO TABS: 25.0000 mg | ORAL_TABLET | Freq: Two times a day (BID) | ORAL | Status: AC

## 2019-05-11 MED ORDER — CEFDINIR 300 MG PO CAPS: 300.0000 mg | ORAL_CAPSULE | Freq: Two times a day (BID) | ORAL | 0 refills | Status: AC

## 2019-05-11 MED ORDER — IRBESARTAN 75 MG PO TABS: 75.0000 mg | ORAL_TABLET | Freq: Every day | ORAL | Status: AC

## 2019-05-11 MED ORDER — MAGNESIUM OXIDE 400 MG PO CAPS: 400.0000 mg | ORAL_CAPSULE | Freq: Every day | ORAL | 0 refills | Status: AC

## 2019-05-11 MED ORDER — METOPROLOL TARTRATE 25 MG PO TABS
25.0000 mg | ORAL_TABLET | Freq: Two times a day (BID) | ORAL | Status: DC
  Administered 2019-05-11: 25 mg via ORAL
  Filled 2019-05-11: qty 1

## 2019-05-11 MED ORDER — CEFDINIR 300 MG PO CAPS
300.0000 mg | ORAL_CAPSULE | Freq: Two times a day (BID) | ORAL | Status: DC
  Filled 2019-05-11: qty 1

## 2019-05-11 MED ORDER — MAGNESIUM SULFATE 2 GM/50ML IV SOLN
2.0000 g | Freq: Once | INTRAVENOUS | Status: AC
  Administered 2019-05-11: 10:00:00 2 g via INTRAVENOUS
  Filled 2019-05-11: qty 50

## 2019-05-11 MED ORDER — INSULIN DETEMIR 100 UNIT/ML ~~LOC~~ SOLN: 30.0000 [IU] | Freq: Two times a day (BID) | SUBCUTANEOUS | 11 refills | Status: AC

## 2019-05-11 MED ORDER — AZITHROMYCIN 250 MG PO TABS: 500.0000 mg | ORAL_TABLET | Freq: Once | ORAL | Status: DC

## 2019-05-11 NOTE — Progress Notes (Signed)
PROGRESS NOTE    TA GAME  [id number] DOB: 1951/03/27 DOA: 05/08/2019 PCP: Lucianne Lei, MD   Brief Narrative:  Patient is a 77 female with history of morbid obesity, insulin-dependent diabetes type 2, low back pain, neuropathy/diabetic nephropathy, OSA on CPAP, COPD, chronic bronchitis on low-dose of steroids, vitamin D deficiency, GERD, headaches, hypertension, depression, gout who presents to the emergency department with complaints of altered mental status.  She was recently discharged from [facility].  Patient was recently admitted on 11/2 with similar complaint when she was found to be confused and it was thought to be secondary to AKI superimposed with polypharmacy.  She was discharged to [facility] on 04/26/19.  At that time gabapentin dose was reduced. On presentation she was found to be tachycardic.  Found to have acute kidney injury with creatinine of 1.9.  Her baseline creatinine is 1-1.1.  UA was impressive for urinary tract infection so urine culture sent.  Chest x-ray showed streaky opacities in the bases.  Urine showed a lot of ketones, beta hydroxybutyrate level positive in serum.  Started on antibiotics for possible CAP and insulin drip for DKA. Gap has closed now and insulin drip has been stopped. Mental status has improved.PT/OT recommended skilled nursing facility.\ Patient is medically stable for discharge to skilled nursing facility as soon as the bed is available.  Assessment & Plan:   Principal Problem:   Sepsis ([place]) Active Problems:   Gout   Acute kidney injury ([place])   Diabetes mellitus type 2 in obese ([facility])   Hypokalemia   Suspected sepsis secondary to possible community-acquired pneumonia: Currently hemodynamically stable.  Not hypoxic.  Chest x-ray showed streaky opacities on the bases.  Continue current antibiotics. Will change to oral. Covid test 19 negative. Negtaive cultures.  Saturating fine on room air  DKA: Elevated anion gap on  presentation.  Blood glucose not that high.  Last hemoglobin A1c was 6.7 on 10/22.  On steroids for chronic bronchitis.    Positive beta hydroxybutyrate level and ketones in urine.  Diabetic coordinator following.   On Lantus and sliding scale.  Patient says she ran out of insulin at home.  AKI on CKD stage III -III: Prerenal due to dehydration, sepsis.  Also on home diuretics and ARB.  She looked dehydrated on presentation.    Hold irbesartan and hydrochlorothiazide.  Kidney function improved with IV fluids.  Altered mental status: Secondary to acute metabolic encephalopathy from possible DKA, pneumonia, polypharmacy.  On gabapentin, Norco, Flexeril at home which could have contributed. Mental status is at baseline today.  She is alert and oriented but has slow speech. blood ethanol, ammonia level negative.  Head CT negative for any acute intracranial abnormalities.    Hypokalemia/hypomagnesemia: Being supplemented  Abnormal EKG: EKG showed T wave inversions in inferior and lateral leads.  Denies any chest pain.  High sensitive troponin negative  Chronic bronchitis: Currently stable.  On prednisone 20 mg daily at home which is on hold.Resume on DC  Gout: On allopurinol  Chronic constipation: Continue bowel regimen  Debility/deconditioning: She was recently placed in a skilled nursing facility and was discharged home.  Looks like she is not that mobile.  She has history of spinal stenosis.  She has not walking the past several years and had several falls. PT/OT evaluation done and recommended SNF            DVT prophylaxis: Lovenox Code Status: Full Family Communication: Called daughter on phone on 05/10/19 Disposition Plan: SNF  soon as bed is available.   Consultants: None  Procedures: None  Antimicrobials:  Anti-infectives (From admission, onward)   Start     Dose/Rate Route Frequency Ordered Stop   05/10/19 2200  azithromycin (ZITHROMAX) tablet 500 mg     500 mg Oral Daily  at bedtime 05/10/19 1336     05/09/19 2000  cefTRIAXone (ROCEPHIN) 1 g in sodium chloride 0.9 % 100 mL IVPB     1 g 200 mL/hr over 30 Minutes Intravenous Every 24 hours 05/08/19 2236     05/09/19 2000  azithromycin (ZITHROMAX) 500 mg in sodium chloride 0.9 % 250 mL IVPB  Status:  Discontinued     500 mg 250 mL/hr over 60 Minutes Intravenous Every 24 hours 05/08/19 2236 05/10/19 1336   05/08/19 2045  cefTRIAXone (ROCEPHIN) 1 g in sodium chloride 0.9 % 100 mL IVPB     1 g 200 mL/hr over 30 Minutes Intravenous  Once 05/08/19 2042 05/08/19 2309   05/08/19 2045  azithromycin (ZITHROMAX) 500 mg in sodium chloride 0.9 % 250 mL IVPB     500 mg 250 mL/hr over 60 Minutes Intravenous  Once 05/08/19 2042 05/09/19 0210      Subjective:  Patient seen and examined the bedside this morning.  Hemodynamically stable.  Looks comfortable today.  Feels better.  Alert and oriented.  Still looks weak.  Eating her food.  Denies any chest pain or shortness of breath or abdominal pain.  She expresses desire to go back to home with home health but we discussed about the importance of going to rehab.   objective: Vitals:   05/10/19 0546 05/10/19 1339 05/10/19 2047 05/11/19 0447  BP: 111/67 (!) 150/84 108/61 131/82  Pulse: (!) 113 (!) 108 (!) 110 (!) 110  Resp:  16 16 20   Temp: 99.2 F (37.3 C) 98 F (36.7 C) 98 F (36.7 C) 98.4 F (36.9 C)  TempSrc: Oral Oral Oral Oral  SpO2: 95% 97% 95% 98%  Weight:      Height:        Intake/Output Summary (Last 24 hours) at 05/11/2019 1227 Last data filed at 05/11/2019 0844 Gross per 24 hour  Intake -  Output 2975 ml  Net -2975 ml   Filed Weights   05/09/19 0351  Weight: 125.7 kg    Examination:  General exam: Morbidly obese, generalized weakness HEENT:PERRL Respiratory system: Diminished air entry on the bases Cardiovascular system: S1 & S2 heard, RRR. No JVD, murmurs, rubs, gallops or clicks. No pedal edema. Gastrointestinal system: Abdomen is  nondistended, soft and nontender. No organomegaly or masses felt. Normal bowel sounds heard. Central nervous system: Alert and awake , oriented, slow speech Extremities: No edema, no clubbing ,no cyanosis, distal peripheral pulses palpable. Skin: No rashes, lesions or ulcers,no icterus ,no pallor   Data Reviewed: I have personally reviewed following labs and imaging studies  CBC: Recent Labs  Lab 05/08/19 1624 05/11/19 0823  WBC 8.8 5.6  NEUTROABS 7.1 3.5  HGB 13.7 11.8*  HCT 44.0 37.8  MCV 93.2 94.5  PLT 271 0000000   Basic Metabolic Panel: Recent Labs  Lab 05/08/19 1624 05/09/19 0144 05/09/19 1309 05/10/19 0759 05/11/19 0823  NA 136 134* 137 134* 137  K 3.3* 5.4* 3.3* 2.9* 3.3*  CL 96* 98 100 102 103  CO2 19* 17* 23 18* 20*  GLUCOSE 273* 231* 234* 157* 119*  BUN 18 20 20 15 8   CREATININE 1.94* 2.03* 1.75* 1.11* 0.93  CALCIUM 9.7 9.4  8.8* 8.5* 9.1  MG  --  1.9  --  1.6* 1.5*   GFR: Estimated Creatinine Clearance: 74.9 mL/min (by C-G formula based on SCr of 0.93 mg/dL). Liver Function Tests: Recent Labs  Lab 05/08/19 1624  AST 12*  ALT 29  ALKPHOS 82  BILITOT 1.2  PROT 7.2  ALBUMIN 3.1*   No results for input(s): LIPASE, AMYLASE in the last 168 hours. Recent Labs  Lab 05/08/19 1624  AMMONIA <9*   Coagulation Profile: No results for input(s): INR, PROTIME in the last 168 hours. Cardiac Enzymes: Recent Labs  Lab 05/08/19 1654  CKTOTAL 72   BNP (last 3 results) No results for input(s): PROBNP in the last 8760 hours. HbA1C: No results for input(s): HGBA1C in the last 72 hours. CBG: Recent Labs  Lab 05/10/19 1153 05/10/19 1635 05/10/19 2046 05/11/19 0754 05/11/19 1130  GLUCAP 116* 115* 115* 130* 175*   Lipid Profile: No results for input(s): CHOL, HDL, LDLCALC, TRIG, CHOLHDL, LDLDIRECT in the last 72 hours. Thyroid Function Tests: Recent Labs    05/10/19 1157  TSH 2.733   Anemia Panel: No results for input(s): VITAMINB12, FOLATE,  FERRITIN, TIBC, IRON, RETICCTPCT in the last 72 hours. Sepsis Labs: Recent Labs  Lab 05/08/19 1624  LATICACIDVEN 1.5    Recent Results (from the past 240 hour(s))  Blood culture (routine x 2)     Status: None (Preliminary result)   Collection Time: 05/08/19  4:18 PM   Specimen: BLOOD  Result Value Ref Range Status   Specimen Description   Final    BLOOD LEFT ANTECUBITAL Performed at [facility], [place] [address], [place], [place] [address]    Special Requests   Final    BOTTLES DRAWN AEROBIC AND ANAEROBIC Blood Culture adequate volume Performed at [facility] [address], [place], [place] [address]    Culture   Final    NO GROWTH 3 DAYS Performed at [facility], [place] [address], [place], [place] [address]    Report Status PENDING  Incomplete  Blood culture (routine x 2)     Status: None (Preliminary result)   Collection Time: 05/08/19  4:30 PM   Specimen: Right Antecubital; Blood  Result Value Ref Range Status   Specimen Description   Final    RIGHT ANTECUBITAL Performed at [facility] [address] [place], [place] [address]    Special Requests   Final    BOTTLES DRAWN AEROBIC AND ANAEROBIC Blood Culture adequate volume Performed at [place] [address], [place], [place] [address]    Culture   Final    NO GROWTH 3 DAYS Performed at [facility], [place] [address], [place], [place] [address]    Report Status PENDING  Incomplete  SARS CORONAVIRUS 2 (TAT 6-24 HRS) Nasopharyngeal Nasopharyngeal Swab     Status: None   Collection Time: 05/08/19  4:49 PM   Specimen: Nasopharyngeal Swab  Result Value Ref Range Status   SARS Coronavirus 2 NEGATIVE NEGATIVE Final    Comment: (NOTE) SARS-CoV-2 target nucleic acids are NOT DETECTED. The SARS-CoV-2 RNA is generally detectable in upper and lower respiratory specimens during the acute phase of infection. Negative results do not preclude  SARS-CoV-2 infection, do not rule out co-infections with other pathogens, and should not be used as the sole basis for treatment or other patient management decisions. Negative results must be combined with clinical observations, patient history, and epidemiological information. The  expected result is Negative. Fact Sheet for Patients: SugarRoll.be Fact Sheet for Healthcare Providers: https://www.woods-mathews.com/ This test is not yet approved or cleared by the [place] FDA and  has been authorized for detection and/or diagnosis of SARS-CoV-2 by FDA under an Emergency Use Authorization (EUA). This EUA will remain  in effect (meaning this test can be used) for the duration of the COVID-19 declaration under Section 56 4(b)(1) of the Act, 21 U.S.C. section 360bbb-3(b)(1), unless the authorization is terminated or revoked sooner. Performed at [facility], [place] [address], [place], [place] [address]   Urine culture     Status: None   Collection Time: 05/08/19 11:50 PM   Specimen: Urine, Random  Result Value Ref Range Status   Specimen Description   Final    URINE, RANDOM Performed at [facility] [address], [place], [place] [address]    Special Requests   Final    NONE Performed at [facility], [place] [address], [place], [place] [address]    Culture   Final    NO GROWTH Performed at [facility], [place] [address], [place], [place] [address]    Report Status 05/09/2019 FINAL  Final         Radiology Studies: No results found.      Scheduled Meds: . allopurinol  300 mg Oral Daily  . azithromycin  500 mg Oral QHS  . benzonatate  100 mg Oral QHS  . cholecalciferol  2,000 Units Oral Daily  . cycloSPORINE  1 drop Both Eyes BID  . docusate sodium  100 mg Oral BID  . enoxaparin (LOVENOX) injection  60 mg Subcutaneous Q24H  . fluticasone  2 spray Each Nare Daily  .  guaiFENesin  600 mg Oral BID  . insulin aspart  0-15 Units Subcutaneous TID WC  . insulin aspart  0-5 Units Subcutaneous QHS  . insulin glargine  30 Units Subcutaneous Daily  . lidocaine  1 patch Transdermal BID  . loratadine  10 mg Oral Daily  . metoprolol tartrate  25 mg Oral BID  . montelukast  10 mg Oral Daily  . multivitamin with minerals  1 tablet Oral QHS  . oxybutynin  15 mg Oral QPM  . pantoprazole  40 mg Oral BID  . polycarbophil  625 mg Oral BID  . polyethylene glycol  17 g Oral Daily  . senna  1 tablet Oral QHS  . sodium bicarbonate  650 mg Oral TID  . sodium chloride  2 spray Each Nare BID  . triamcinolone cream  1 application Topical BID   Continuous Infusions: . cefTRIAXone (ROCEPHIN)  IV 1 g (05/10/19 2206)     LOS: 3 days    Time spent: 25 mins.More than 50% of that time was spent in counseling and/or coordination of care.      Shelly Coss, MD [facility] Pager [phone]  If 7PM-7AM, please contact night-coverage [email or web address] Password [facility] 05/11/2019, 12:27 PM

## 2019-05-11 NOTE — TOC Progression Note (Signed)
Transition of Care [facility]) - Progression Note    Patient Details  Name: Christy May MRN: [id number] Date of Birth: June 02, 1951  Transition of Care [facility]) CM/SW Contact  Treyvin Glidden, Juliann Pulse, RN Phone Number: 05/11/2019, 12:27 PM  Clinical Narrative:   05/11/2019 Medicare Nursing Home Results [email or web address] 1/7 Close window 31 hospitals within 25 miles from the center [place] [address]. Nursing Home Search Results Results List Table Nursing Home Information Overall Rating Health Inpections Staffing Quality Ratings Distance [place] AT [place], [place] [address] [phone] Below Average Below Average Below Average Below Average 1.62 [place], [place] [address] [phone] Much Above Average Above Average Much Above Average Much Above Average 1.77 [facility] LIVING & REHAB AT THE [place] [facility] [place], [place] [address] [phone] Below Average Below Average Below Average Below Average 2.65 [place] 2 out of 5 stars 2 out of 5 stars 2 out of 5 stars 2 out of 5 stars 5 out of 5 stars 4 out of 5 stars 5 out of 5 stars 5 out of 5 stars 2 out of 5 stars 2 out of 5 stars 2 out of 5 stars 2 out of 5 stars 05/11/2019 Medicare Nursing Home Results [email or web address] 2/7 [place] [address] [place], [place] [address] [phone] Much Below Average Below Average Much Below Average Average 2.72 [facility] [place], [place] [address] [phone] Much Above Average Much Above Average Not Available12 Much Above Average 3.91 [facility] [address] [place], [place] [address] [phone] Much Below Average Much Below Average Below Average Below Average 4.01 [facility] [address] [place], [place] [address] [phone] Much Below Average Much Below Average Average Much Below Average 4.42 [place], [place] [address] [phone] Below Average Below Average Below Average Below Average 4.63 [place], [place] [address] [phone] Much Above Average Above Average Much Above Average Much Above Average 4.94 [place] 1 out of 5 stars 2 out of 5 stars 1 out of 5 stars 3 out of 5 stars 5 out of 5 stars 5 out of 5 stars 5 out of 5 stars 1 out of 5 stars 1 out of 5 stars 2 out of 5 stars 2 out of 5 stars 1 out of 5 stars 1 out of 5 stars 3 out of 5 stars 1 out of 5 stars 2 out of 5 stars 2 out of 5 stars 2 out of 5 stars 2 out of 5 stars 5 out of 5 stars 4 out of 5 stars 5 out of 5 stars 5 out of 5 stars 05/11/2019 Medicare Nursing Home Results [email or web address] 3/7 [place] Inpections Staffing Quality Ratings [facility] [place] [address] [place], [place] [address] [phone] Below Average Below Average Below Average Average 5.19 [place], [place] [address] [phone] Much Above Average Much Above Average Not Available12 Below Average 5.33 [facility] [place], [place] [address] [phone] Below Average Below Average Below Average Average 6.33 [facility] [address]  [place], [place] [address] [phone] Average Average Average Average 7.11 [place], [place] [address] [phone] Above Average Above Average Below Average Above Average 10.82 [place], [place] [address] [phone] Much Below Average Much Below Average Below Average Below Average 11.16 [place] 2 out of 5 stars 2 out of 5 stars 2 out of 5 stars 3 out of 5 stars 5 out of 5 stars 5 out of 5 stars 2 out of 5 stars 2 out of 5 stars 2 out of 5 stars 2 out of 5 stars 3 out of 5 stars 3 out of 5 stars 3 out of 5 stars 3 out of 5 stars 3 out of 5 stars 4 out of 5 stars 4 out of 5 stars 2 out of 5 stars 4 out of 5 stars 1 out of 5 stars 1 out of 5 stars 2 out of 5 stars 2 out of 5 stars 05/11/2019 Medicare Nursing Home Results [email or web address] 4/7 Nursing Home Information Overall Rating Health Inpections Staffing Quality Ratings Distance THE [facility] [address] [place], [place] [address] [phone] Below Average Below Average Below Average Below Average 11.29 [facility] [place], [place] [address] [phone] Much Above Average Much Above Average Not Available12 Much Above Average 11.87 [facility] LANDING AT [facility] [place], [place] [phone] Much Above Average Much Above Average Above Average Much Above Average 13.30 [place] [address] [place], [place] [address] [phone] Much Below Average Much Below Average Much  Below Average Average 15.65 [place], [place] [address] [phone] Much Below Average Much Below Average Below Average Below Average 16.26 [facility] [address] [place], [place] [address] [phone] Much Below Average Much Below Average Not Available12 Average 16.42 [place] 2 out of 5 stars 2 out of 5 stars 2 out of 5 stars 2 out of 5 stars 5 out of 5 stars 5 out of 5 stars 5 out of 5 stars 5 out of 5 stars 5 out of 5 stars 4 out of 5 stars 5 out of 5 stars 1 out of 5 stars 1 out of 5 stars 1 out of 5 stars 3 out of 5 stars 1 out of 5 stars 1 out of 5 stars 2 out of 5 stars 2 out of 5 stars 1 out of 5 stars 1 out of 5 stars 3 out of 5 stars 05/11/2019 Medicare Nursing Home Results [email or web address] 5/7 [place] Staffing Quality Ratings Distance [place] [address] [place], [place] [address] [phone] Much Below Average Much Below Average Below Average Average 16.54 [place] [facility] [address] [place], [place] [address] [phone] Above Average Average Below Average Much Above Average 16.97 [facility] [place], [place] [address] [phone] Much Below Average Much Below Average Average Average 17.95 [facility] [place] AT [facility] [place] [address] [place], [place] [address] [phone] Average Average Below Average Average 18.35 [facility] & RETIREMENT CT [address] [place], [place] [id number] [phone] Below Average Below Average Below Average Below Average 18.54 [place], [place] [address] [phone] Much  Above  Average Above Average Much Above Average Much Above Average 20.96 [place] 1 out of 5 stars 1 out of 5 stars 2 out of 5 stars 3 out of 5 stars 4 out of 5 stars 3 out of 5 stars 2 out of 5 stars 5 out of 5 stars 1 out of 5 stars 1 out of 5 stars 3 out of 5 stars 3 out of 5 stars 3 out of 5 stars 3 out of 5 stars 2 out of 5 stars 3 out of 5 stars 2 out of 5 stars 2 out of 5 stars 2 out of 5 stars 2 out of 5 stars 5 out of 5 stars 4 out of 5 stars 5 out of 5 stars 5 out of 5 stars 05/11/2019 Medicare Nursing Home Results [email or web address] 6/7 Nursing Home Information Overall Rating Health Inpections Staffing Quality Ratings Distance [facility] [place], [place] [address] [phone] Much Above Average Above Average Not Available12 Much Above Average 21.22 [facility] [place] [address] [place], [place] [address] [phone] Average Average Below Average Below Average [address] [facility] [address] [place], [place] [address] [phone] Much Below Average Much Below Average Much Below Average Below Average 22.95 [place] [address] [place], [place] [address] [phone] Much Below Average Much Below Average Average Below Average 24.73 [facility] NursingHomeCompare Footnotes Footnote number Footnote as displayed on nursing home Compare 1 Newly certified nursing home with less than 12-15 months of data available or the nursing opened less than 6 months ago, and there were no data to submit or claims for this measure. 2 Not enough data available to calculate a star rating. 6 This facility did not submit staffing data, or submitted data that did not meet the criteria required to calculate a staffing  measure. 7 CMS determined that the percentage was not accurate or data suppressed by CMS for one or more quarters. 5 out of 5 stars 4 out of 5 stars 5 out of 5 stars 3 out of 5 stars 3 out of 5 stars 2 out of 5 stars 2 out of 5 stars 1 out of 5 stars 1 out of 5 stars 1 out of 5 stars 2 out of 5 stars 1 out of 5 stars 1 out of 5 stars 3 out of 5 stars 2 out of 5 stars 05/11/2019 Medicare Nursing Home Results [email or web address] 7/7 Footnote number Footnote as displayed on nursing home Compare 9 The number of residents or resident stays is too small to report. Call the facility to discuss this quality measure. 10 The data for this measure is missing or was not submitted. Call the facility to discuss this quality measure. 12 This facility either did not submit staffing data, has reported a high number of days without a registered nurse onsite, or submitted data that could not be verified through an audit. 13 Results are based on a shorter time period than required. 14 This nursing home is not required to submit data for the [place] Reporting Program. 18 This facility is not rated due to a history of serious quality issues and is included in the special focus facility program.    Expected Discharge Plan: Skilled Nursing Facility Barriers to Discharge: Continued Medical Work up  Expected Discharge Plan and Services Expected Discharge Plan: [place]   Discharge Planning Services: CM Consult Post Acute Care Choice: [place]  Living arrangements for the past 2 months: Single Family Home                                       Social Determinants of Health (SDOH) Interventions    Readmission Risk Interventions No flowsheet data found.

## 2019-05-11 NOTE — TOC Transition Note (Signed)
Transition of Care [facility]) - CM/SW Discharge Note   Patient Details  Name: INIYA ANDREASON MRN: [id number] Date of Birth: 1950-10-03  Transition of Care [facility]) CM/SW Contact:  Dessa Phi, RN Phone Number: 05/11/2019, 3:08 PM   Clinical Narrative:PTAR called going to [facility] [address],nurse call report [phone]. No further CM needs.      Final next level of care: Skilled Nursing Facility Barriers to Discharge: No Barriers Identified   Patient Goals and CMS Choice Patient states their goals for this hospitalization and ongoing recovery are:: go home      Discharge Placement   Existing PASRR number confirmed : 05/03/19          Patient chooses bed at: Other - please specify in the comment section below:([facility]) Patient to be transferred to facility by: [place] Name of family member notified: [place] Patient and family notified of of transfer: 05/11/19  Discharge Plan and Services   Discharge Planning Services: CM Consult Post Acute Care Choice: [place]                               Social Determinants of Health (SDOH) Interventions     Readmission Risk Interventions No flowsheet data found.

## 2019-05-11 NOTE — TOC Transition Note (Signed)
Transition of Care [facility]) - CM/SW Discharge Note   Patient Details  Name: Christy May MRN: [id number] Date of Birth: 1950-07-16  [facility] CM/SW Contact:  Dessa Phi, RN Phone Number: 05/11/2019, 2:03 PM   Clinical Narrative: d/c today [facility]-patient/brother I agreement rep Tammy aware of d/c summary. Will call [facility] once ready.      Final next level of care: Skilled Nursing Facility Barriers to Discharge: No Barriers Identified   Patient Goals and CMS Choice Patient states their goals for this hospitalization and ongoing recovery are:: go home      Discharge Placement   Existing PASRR number confirmed : 05/03/19          Patient chooses bed at: Other - please specify in the comment section below:([facility]) Patient to be transferred to facility by: [place] Name of family member notified: [place] Patient and family notified of of transfer: 05/11/19  Discharge Plan and Services   Discharge Planning Services: CM Consult Post Acute Care Choice: [place]                               Social Determinants of Health (SDOH) Interventions     Readmission Risk Interventions No flowsheet data found.

## 2019-05-11 NOTE — Discharge Summary (Signed)
Physician Discharge Summary  Christy May NKN:[id number] DOB: 1951-02-13 DOA: 05/08/2019  PCP: Lucianne Lei, MD  Admit date: 05/08/2019 Discharge date: 05/11/2019  Admitted From: Home Disposition:  Home  Discharge Condition:Stable CODE STATUS:FULL Diet recommendation: carbohydrate consistent   Brief/Interim Summary:  Patient is a 89 female with history of morbid obesity, insulin-dependent diabetes type 2, low back pain, neuropathy/diabetic nephropathy, OSA on CPAP, COPD, chronic bronchitis on low-dose of steroids, vitamin D deficiency, GERD, headaches, hypertension, depression, gout who presents to the emergency department with complaints of altered mental status.  She was recently discharged from [facility].  Patient was recently admitted on 11/2 with similar complaint when she was found to be confused and it was thought to be secondary to AKI superimposed with polypharmacy.  She was discharged to [facility] on 04/26/19.  At that time gabapentin dose was reduced. On presentation she was found to be tachycardic.  Found to have acute kidney injury with creatinine of 1.9.  Her baseline creatinine is 1-1.1.  UA was impressive for urinary tract infection so urine culture sent.  Chest x-ray showed streaky opacities in the bases.  Urine showed a lot of ketones, beta hydroxybutyrate level positive in serum.  Started on antibiotics for possible CAP and insulin drip for DKA. Gap has closed now and insulin drip has been stopped. Mental status has improved.PT/OT recommended skilled nursing facility. Patient is medically stable for discharge to skilled nursing facility .  Following problems were addressed during hospitalization:  Suspected sepsis secondary to possible community-acquired pneumonia: Currently hemodynamically stable.  Not hypoxic.  Chest x-ray showed streaky opacities on the bases.  Continue current antibiotics. Covid test 19 negative. Negative cultures.  Saturating fine on room  air  DKA: Elevated anion gap on presentation.  Blood glucose not that high.  Last hemoglobin A1c was 6.7 on 10/22.  On steroids for chronic bronchitis.    Positive beta hydroxybutyrate level and ketones in urine.  Diabetic coordinator following.     Patient says she ran out of insulin at home.Continue recommended insulin regimen.  AKI on CKD stage III -III: Prerenal due to dehydration, sepsis.  Also on home diuretics and ARB.  She looked dehydrated on presentation.    Hold  hydrochlorothiazide.  Kidney function improved with IV fluids.  Altered mental status: Secondary to acute metabolic encephalopathy from possible DKA, pneumonia, polypharmacy.  On gabapentin, Norco, Flexeril at home which could have contributed. Mental status is at baseline today.  She is alert and oriented but has slow speech. blood ethanol, ammonia level negative.  Head CT negative for any acute intracranial abnormalities.    Hypokalemia/hypomagnesemia: Being supplemented  Abnormal EKG: EKG showed T wave inversions in inferior and lateral leads.  Denies any chest pain.  High sensitive troponin negative  Chronic bronchitis: Currently stable.  On prednisone 20 mg daily at home which is on hold.Resume on DC  OSA: On Cpap.Can use pressure of 10 cm water  Gout: On allopurinol  Chronic constipation: Continue bowel regimen  Debility/deconditioning: She was recently placed in a skilled nursing facility and was discharged home.  Looks like she is not that mobile.  She has history of spinal stenosis.  She has not walking the past several years and had several falls. PT/OT evaluation done and recommended SNF   Discharge Diagnoses:  Principal Problem:   Sepsis ([place]) Active Problems:   Gout   Acute kidney injury ([place])   Diabetes mellitus type 2 in obese (HCC)   Hypokalemia  Discharge Instructions  Discharge Instructions    Diet - low sodium heart healthy   Complete by: As directed    Discharge instructions    Complete by: As directed    1)Please take prescribed medication as instructed. 2)Do a CBC and BMP tests in a week.   Increase activity slowly   Complete by: As directed      Allergies as of 05/11/2019      Reactions   Ace Inhibitors Anaphylaxis, Cough   Tolerates Irbesartan (home med)   Penicillins Hives, Other (See Comments)   Tolerated ceftriaxone in 2019 Has patient had a PCN reaction causing immediate rash, facial/tongue/throat swelling, SOB or lightheadedness with hypotension: Yes Has patient had a PCN reaction causing severe rash involving mucus membranes or skin necrosis:  No Has patient had a PCN reaction that required hospitalization: No Has patient had a PCN reaction occurring within the last 10 years: No If all of the above answers are "NO", then may proceed with Cephalosporin use.   Adhesive [tape] Hives, Rash      Medication List    STOP taking these medications   cyclobenzaprine 10 MG tablet Commonly known as: FLEXERIL   feeding supplement (PRO-STAT SUGAR FREE 64) Liqd   gabapentin 300 MG capsule Commonly known as: NEURONTIN   hydrochlorothiazide 12.5 MG capsule Commonly known as: MICROZIDE   HYDROcodone-acetaminophen 5-325 MG tablet Commonly known as: NORCO/VICODIN     TAKE these medications   allopurinol 300 MG tablet Commonly known as: ZYLOPRIM Take 300 mg by mouth daily.   Aspercreme Lidocaine 4 % Ptch Generic drug: Lidocaine Place 1 patch onto the skin 2 (two) times daily.   benzonatate 100 MG capsule Commonly known as: TESSALON Take 100 mg by mouth at bedtime.   cefdinir 300 MG capsule Commonly known as: OMNICEF Take 1 capsule (300 mg total) by mouth every 12 (twelve) hours for 3 days.   cycloSPORINE 0.05 % ophthalmic emulsion Commonly known as: RESTASIS Place 1 drop into both eyes 2 (two) times daily.   docusate sodium 100 MG capsule Commonly known as: COLACE Take 1 capsule (100 mg total) by mouth 2 (two) times daily.   fluticasone  50 MCG/ACT nasal spray Commonly known as: FLONASE SHAKE LIQUID AND USE 2 SPRAYS IN EACH NOSTRIL DAILY What changed: See the new instructions.   guaiFENesin 600 MG 12 hr tablet Commonly known as: MUCINEX Take 600 mg by mouth 2 (two) times daily.   insulin aspart 100 UNIT/ML injection Commonly known as: novoLOG Inject 12 Units into the skin 3 (three) times daily with meals.   insulin detemir 100 UNIT/ML injection Commonly known as: LEVEMIR Inject 0.3 mLs (30 Units total) into the skin 2 (two) times daily. What changed: how much to take   INSULIN SYRINGE 1CC/31GX5/16" 31G X 5/16" 1 ML Misc USE TO INJECT INSULIN TWICE DAILY   irbesartan 75 MG tablet Commonly known as: AVAPRO Take 1 tablet (75 mg total) by mouth daily. What changed:   medication strength  how much to take   loratadine 10 MG tablet Commonly known as: CLARITIN Take 10 mg by mouth daily.   Magnesium Oxide 400 MG Caps Take 1 capsule (400 mg total) by mouth daily.   metoprolol tartrate 25 MG tablet Commonly known as: LOPRESSOR Take 1 tablet (25 mg total) by mouth 2 (two) times daily. What changed:   medication strength  how much to take   montelukast 10 MG tablet Commonly known as: SINGULAIR TAKE 1 TABLET(10 MG) BY  MOUTH DAILY What changed: See the new instructions.   multivitamin with minerals tablet Take 1 tablet by mouth at bedtime.   OneTouch Delica Plus [id number] Misc 1 each by Other route 2 (two) times daily. E11.9   OneTouch Verio test strip Generic drug: glucose blood 1 each by Other route 2 (two) times daily. E11.9   OneTouch Verio w/Device Kit Use to check blood sugar 3 times a day   oxybutynin 15 MG 24 hr tablet Commonly known as: DITROPAN XL Take 15 mg by mouth every evening.   OXYGEN Inhale 2 L/min into the lungs.   pantoprazole 40 MG tablet Commonly known as: PROTONIX TAKE 1 TABLET(40 MG) BY MOUTH TWICE DAILY What changed: See the new instructions.   polycarbophil 625 MG  tablet Commonly known as: FIBERCON Take 625 mg by mouth 2 (two) times daily.   polyethylene glycol 17 g packet Commonly known as: MIRALAX / GLYCOLAX Take 17 g by mouth 2 (two) times daily. What changed:   when to take this  additional instructions   Potassium Chloride ER 20 MEQ Tbcr Take 20 mEq by mouth daily.   predniSONE 20 MG tablet Commonly known as: DELTASONE Take 20 mg by mouth daily with breakfast.   PRESCRIPTION MEDICATION Inhale into the lungs at bedtime. CPAP   senna 8.6 MG Tabs tablet Commonly known as: SENOKOT Take 1 tablet (8.6 mg total) by mouth daily. What changed: when to take this   sodium chloride 0.65 % Soln nasal spray Commonly known as: OCEAN Place 2 sprays into both nostrils 2 (two) times daily.   triamcinolone cream 0.1 % Commonly known as: KENALOG Apply 1 application topically See admin instructions. Apply 2 grams topically to bilateral forearms twice daily   Trulicity 1.5 VH/8.4ON Sopn Generic drug: Dulaglutide Inject 1.5 mg into the skin every Friday.   Vitamin D-3 25 MCG (1000 UT) Caps Take 2,000 Units by mouth daily.       Allergies  Allergen Reactions  . Ace Inhibitors Anaphylaxis and Cough    Tolerates Irbesartan (home med)  . Penicillins Hives and Other (See Comments)    Tolerated ceftriaxone in 2019  Has patient had a PCN reaction causing immediate rash, facial/tongue/throat swelling, SOB or lightheadedness with hypotension: Yes Has patient had a PCN reaction causing severe rash involving mucus membranes or skin necrosis:  No Has patient had a PCN reaction that required hospitalization: No Has patient had a PCN reaction occurring within the last 10 years: No If all of the above answers are "NO", then may proceed with Cephalosporin use.  . Adhesive [Tape] Hives and Rash    Consultations:  None   Procedures/Studies: Dg Chest 2 View  Result Date: 04/21/2019 CLINICAL DATA:  Chest pain. EXAM: CHEST - 2 VIEW COMPARISON:   04/22/2018. FINDINGS: Stable cardiomegaly. Mild bibasilar atelectasis/infiltrates. Small bilateral pleural effusions. Cervicothoracic spine fusion. IMPRESSION: 1.  Stable cardiomegaly. 2. Mild bibasilar atelectasis/infiltrates. Small bilateral pleural effusions. Electronically Signed   By: Marcello Moores  Register   On: 04/21/2019 11:38   Ct Head Wo Contrast  Result Date: 05/08/2019 CLINICAL DATA:  Altered mental status EXAM: CT HEAD WITHOUT CONTRAST TECHNIQUE: Contiguous axial images were obtained from the base of the skull through the vertex without intravenous contrast. COMPARISON:  CT brain 04/24/2019 FINDINGS: Brain: Mild motion degradation. No acute territorial infarction, hemorrhage, or intracranial mass. Mild small vessel ischemic changes of the white matter. Mild atrophy. Stable ventricle size Vascular: No hyperdense vessels. Scattered calcifications at the carotid siphons. Skull: Normal.  Negative for fracture or focal lesion. Sinuses/Orbits: No acute finding. Other: None IMPRESSION: 1. Mild motion degraded study. 2. No CT evidence for acute intracranial abnormality. Mild atrophy and small vessel ischemic changes of the white matter. Electronically Signed   By: Donavan Foil M.D.   On: 05/08/2019 17:08   Ct Head Wo Contrast  Result Date: 04/24/2019 CLINICAL DATA:  Altered level of consciousness EXAM: CT HEAD WITHOUT CONTRAST TECHNIQUE: Contiguous axial images were obtained from the base of the skull through the vertex without intravenous contrast. COMPARISON:  01/27/2018 FINDINGS: Brain: No evidence of acute infarction, hemorrhage, hydrocephalus, extra-axial collection or mass lesion/mass effect. Mild cortical and cerebellar atrophy. Mild white matter disease compatible with chronic small vessel ischemia given medical history. Vascular: Atherosclerotic calcification Skull: Normal. Negative for fracture or focal lesion. Sinuses/Orbits: Bilateral cataract resection IMPRESSION: 1. No evidence of acute  intracranial or cervical spine injury. 2. Mild atrophy and chronic small vessel ischemia. Electronically Signed   By: Monte Fantasia M.D.   On: 04/24/2019 04:31   Ct Abdomen Pelvis W Contrast  Result Date: 04/21/2019 CLINICAL DATA:  Abdominal distension. EXAM: CT ABDOMEN AND PELVIS WITH CONTRAST TECHNIQUE: Multidetector CT imaging of the abdomen and pelvis was performed using the standard protocol following bolus administration of intravenous contrast. CONTRAST:  119m OMNIPAQUE IOHEXOL 300 MG/ML  SOLN COMPARISON:  February 28, 2015 and August 30, 2017 FINDINGS: Lower chest: Plate like opacities in the bases, right greater than left, are most consistent with atelectasis. No focal infiltrate to suggest pneumonia. No nodules or masses. The lower chest is otherwise unremarkable. Hepatobiliary: The patient is status post cholecystectomy. The common bile duct is dilated to 2.2 cm today versus 2.3 cm in September 2016. Mild intrahepatic ductal dilatation is identified, mildly more prominent compared to previous studies. No filling defects are identified. No liver masses are noted. The portal vein is normal. Pancreas: Unremarkable. No pancreatic ductal dilatation or surrounding inflammatory changes. Spleen: Normal in size without focal abnormality. Adrenals/Urinary Tract: Adrenal glands are normal. Multiple small cysts are seen in the kidneys, some too small to characterize. No suspicious solid masses are identified. No hydronephrosis or acute perinephric stranding. The ureters are normal with no dilatation or stone. The bladder is unremarkable. Stomach/Bowel: The stomach and small bowel are normal. The colon is normal. The appendix is not visualized but there is no secondary evidence of appendicitis. Vascular/Lymphatic: Atherosclerotic changes are seen in the nonaneurysmal aorta. No adenopathy. Reproductive: Status post hysterectomy. No adnexal masses. Other: No abdominal wall hernia or abnormality. No abdominopelvic  ascites. Musculoskeletal: No acute or significant osseous findings. IMPRESSION: 1. Platelike opacities in the lung bases are most consistent with atelectasis. 2. Intra and extrahepatic biliary duct dilatation. The common bile duct dilatation is similar since 2016, likely due to previous cholecystectomy. The intrahepatic ductal dilatation is mildly more prominent but probably normal for this patient. Recommend correlation with labs to exclude signs of biliary obstruction. 3. Numerous cysts in the kidneys. 4. Status post appendectomy. 5. Atherosclerotic changes in the nonaneurysmal aorta. Electronically Signed   By: DDorise BullionIII M.D   On: 04/21/2019 18:49   [place]Renal  Result Date: 04/24/2019 CLINICAL DATA:  Acute renal failure EXAM: RENAL / URINARY TRACT ULTRASOUND COMPLETE COMPARISON:  Abdominal CT from 3 days ago FINDINGS: Right Kidney: Renal measurements: 10.8 x 4.8 x 5.3 cm = volume: 143 mL . Echogenicity within normal limits. No mass or hydronephrosis visualized. Left Kidney: Renal measurements: 11.7 x 5 x 4.8 cm =  volume: 149 mL. Echogenicity within normal limits. No solid mass or hydronephrosis visualized. 18 mm upper pole cortical cyst. Bladder: Appears normal for degree of bladder distention. Other: Sensitivity is degraded by body habitus. There are numerous small cysts by comparison abdominal CT that are not visible on this study. IMPRESSION: 1. No hydronephrosis or acute finding. 2. Small bilateral renal cysts. Electronically Signed   By: Monte Fantasia M.D.   On: 04/24/2019 05:01   Dg Chest Portable 1 View  Result Date: 05/08/2019 CLINICAL DATA:  Altered mental status EXAM: PORTABLE CHEST 1 VIEW COMPARISON:  Radiograph 04/24/2019 FINDINGS: Lung volumes are persistently low with streaky opacities in the bases favoring atelectasis though underlying infection could have a similar appearance. No pneumothorax. No effusion. Stable cardiomediastinal contours with mild cardiomegaly. Cervical  fixation hardware is partially visualized. No acute osseous or soft tissue abnormality. IMPRESSION: Persistently low lung volumes with streaky opacities in the bases, favoring atelectasis though underlying infection could have a similar appearance. Stable cardiomegaly. Electronically Signed   By: Lovena Le M.D.   On: 05/08/2019 19:10   Dg Chest Port 1 View  Result Date: 04/24/2019 CLINICAL DATA:  Weakness EXAM: PORTABLE CHEST 1 VIEW COMPARISON:  04/21/2019 FINDINGS: The heart size is enlarged. The lung volumes are low. There is bibasilar atelectasis. No pneumothorax. There is some vascular congestion without overt pulmonary edema. Patient is status post prior ACDF of the lower cervical spine. IMPRESSION: 1. Low lung volumes with bibasilar atelectasis. In infiltrate is difficult to entirely exclude. 2. Cardiomegaly.  Vascular congestion is noted. Electronically Signed   By: Constance Holster M.D.   On: 04/24/2019 00:51      Subjective: Patient seen and examined at the bedside this morning.  Hemodynamically stable for discharge.  Discharge Exam: Vitals:   05/10/19 2047 05/11/19 0447  BP: 108/61 131/82  Pulse: (!) 110 (!) 110  Resp: 16 20  Temp: 98 F (36.7 C) 98.4 F (36.9 C)  SpO2: 95% 98%   Vitals:   05/10/19 0546 05/10/19 1339 05/10/19 2047 05/11/19 0447  BP: 111/67 (!) 150/84 108/61 131/82  Pulse: (!) 113 (!) 108 (!) 110 (!) 110  Resp:  '16 16 20  ' Temp: 99.2 F (37.3 C) 98 F (36.7 C) 98 F (36.7 C) 98.4 F (36.9 C)  TempSrc: Oral Oral Oral Oral  SpO2: 95% 97% 95% 98%  Weight:      Height:        General: Pt is alert, awake, not in acute distress Cardiovascular: RRR, S1/S2 +, no rubs, no gallops Respiratory: CTA bilaterally, no wheezing, no rhonchi Abdominal: Soft, NT, ND, bowel sounds + Extremities: no edema, no cyanosis    The results of significant diagnostics from this hospitalization (including imaging, microbiology, ancillary and laboratory) are listed below  for reference.     Microbiology: Recent Results (from the past 240 hour(s))  Blood culture (routine x 2)     Status: None (Preliminary result)   Collection Time: 05/08/19  4:18 PM   Specimen: BLOOD  Result Value Ref Range Status   Specimen Description   Final    BLOOD LEFT ANTECUBITAL Performed at [facility], [place] [address], [place], [place] [address]    Special Requests   Final    BOTTLES DRAWN AEROBIC AND ANAEROBIC Blood Culture adequate volume Performed at [place] [address], [place], [place] [address]    Culture   Final    NO GROWTH 3 DAYS Performed at [facility], [place] [address], [place], [place] [address]    Report Status PENDING  Incomplete  Blood culture (routine x 2)     Status: None (Preliminary result)   Collection Time: 05/08/19  4:30 PM   Specimen: Right Antecubital; Blood  Result Value Ref Range Status   Specimen Description   Final    RIGHT ANTECUBITAL Performed at [place] [address], [place], [place] [address]    Special Requests   Final    BOTTLES DRAWN AEROBIC AND ANAEROBIC Blood Culture adequate volume Performed at [place] [address] [place], [place] [address]    Culture   Final    NO GROWTH 3 DAYS Performed at [facility], [place] [address], [place], [place] [address]    Report Status PENDING  Incomplete  SARS CORONAVIRUS 2 (TAT 6-24 HRS) Nasopharyngeal Nasopharyngeal Swab     Status: None   Collection Time: 05/08/19  4:49 PM   Specimen: Nasopharyngeal Swab  Result Value Ref Range Status   SARS Coronavirus 2 NEGATIVE NEGATIVE Final    Comment: (NOTE) SARS-CoV-2 target nucleic acids are NOT DETECTED. The SARS-CoV-2 RNA is generally detectable in upper and lower respiratory specimens during the acute phase of infection. Negative results do not preclude SARS-CoV-2 infection, do not rule out co-infections with other pathogens, and should not be  used as the sole basis for treatment or other patient management decisions. Negative results must be combined with clinical observations, patient history, and epidemiological information. The expected result is Negative. Fact Sheet for Patients: SugarRoll.be Fact Sheet for Healthcare Providers: https://www.woods-[facility].com/ This test is not yet approved or cleared by the [place] FDA and  has been authorized for detection and/or diagnosis of SARS-CoV-2 by FDA under an Emergency Use Authorization (EUA). This EUA will remain  in effect (meaning this test can be used) for the duration of the COVID-19 declaration under Section 56 4(b)(1) of the Act, 21 U.S.C. section 360bbb-3(b)(1), unless the authorization is terminated or revoked sooner. Performed at [facility], [place] [address], [place], [place] [address]   Urine culture     Status: None   Collection Time: 05/08/19 11:50 PM   Specimen: Urine, Random  Result Value Ref Range Status   Specimen Description   Final    URINE, RANDOM Performed at [place] [address], [place], [place] [address]    Special Requests   Final    NONE Performed at [facility], [place] [address], [place], [place] [address]    Culture   Final    NO GROWTH Performed at [facility], [place] [address] [place], [place] [address]    Report Status 05/09/2019 FINAL  Final     Labs: BNP (last 3 results) Recent Labs    04/21/19 1409  BNP 64.6   Basic Metabolic Panel: Recent Labs  Lab 05/08/19 1624 05/09/19 0144 05/09/19 1309 05/10/19 0759 05/11/19 0823  NA 136 134* 137 134* 137  K 3.3* 5.4* 3.3* 2.9* 3.3*  CL 96* 98 100 102 103  CO2 19* 17* 23 18* 20*  GLUCOSE 273* 231* 234* 157* 119*  BUN '18 20 20 15 8  ' CREATININE 1.94* 2.03* 1.75* 1.11* 0.93  CALCIUM 9.7 9.4 8.8* 8.5* 9.1  MG  --  1.9  --  1.6* 1.5*   Liver Function Tests: Recent Labs  Lab  05/08/19 1624  AST 12*  ALT 29  ALKPHOS 82  BILITOT 1.2  PROT 7.2  ALBUMIN 3.1*   No results for input(s): LIPASE, AMYLASE in the last 168 hours. Recent Labs  Lab 05/08/19 1624  AMMONIA <9*   CBC: Recent Labs  Lab 05/08/19 1624 05/11/19 0823  WBC 8.8 5.6  NEUTROABS 7.1 3.5  HGB 13.7 11.8*  HCT 44.0 37.8  MCV 93.2 94.5  PLT 271 176   Cardiac Enzymes: Recent Labs  Lab 05/08/19 1654  CKTOTAL 72   BNP: Invalid input(s): POCBNP CBG: Recent Labs  Lab 05/10/19 1153 05/10/19 1635 05/10/19 2046 05/11/19 0754 05/11/19 1130  GLUCAP 116* 115* 115* 130* 175*   D-Dimer No results for input(s): DDIMER in the last 72 hours. Hgb A1c No results for input(s): HGBA1C in the last 72 hours. Lipid Profile No results for input(s): CHOL, HDL, LDLCALC, TRIG, CHOLHDL, LDLDIRECT in the last 72 hours. Thyroid function studies Recent Labs    05/10/19 1157  TSH 2.733   Anemia work up No results for input(s): VITAMINB12, FOLATE, FERRITIN, TIBC, IRON, RETICCTPCT in the last 72 hours. Urinalysis    Component Value Date/Time   COLORURINE YELLOW (A) 05/08/2019 2350   APPEARANCEUR HAZY (A) 05/08/2019 2350   LABSPEC 1.018 05/08/2019 2350   PHURINE 5.0 05/08/2019 2350   GLUCOSEU >=500 (A) 05/08/2019 2350   GLUCOSEU NEGATIVE 12/14/2011 1707   HGBUR SMALL (A) 05/08/2019 2350   BILIRUBINUR NEGATIVE 05/08/2019 2350   BILIRUBINUR n 11/16/2014 1515   KETONESUR 20 (A) 05/08/2019 2350   PROTEINUR 30 (A) 05/08/2019 2350   UROBILINOGEN 0.2 02/27/2015 2226   NITRITE NEGATIVE 05/08/2019 2350   LEUKOCYTESUR NEGATIVE 05/08/2019 2350   Sepsis Labs Invalid input(s): PROCALCITONIN,  WBC,  LACTICIDVEN Microbiology Recent Results (from the past 240 hour(s))  Blood culture (routine x 2)     Status: None (Preliminary result)   Collection Time: 05/08/19  4:18 PM   Specimen: BLOOD  Result Value Ref Range Status   Specimen Description   Final    BLOOD LEFT ANTECUBITAL Performed at [facility], [place] [address] [place], [place] [address]    Special Requests   Final    BOTTLES DRAWN AEROBIC AND ANAEROBIC Blood Culture adequate volume Performed at [place] [address], [place], [place] [address]    Culture   Final    NO GROWTH 3 DAYS Performed at [facility], [place] [address] [place], [place] [address]    Report Status PENDING  Incomplete  Blood culture (routine x 2)     Status: None (Preliminary result)   Collection Time: 05/08/19  4:30 PM   Specimen: Right Antecubital; Blood  Result Value Ref Range Status   Specimen Description   Final    RIGHT ANTECUBITAL Performed at [place] [address], [place], [place] [address]    Special Requests   Final    BOTTLES DRAWN AEROBIC AND ANAEROBIC Blood Culture adequate volume Performed at [place] [address], [place], [place] [address]    Culture   Final    NO GROWTH 3 DAYS Performed at [facility], [place] [address], [place], [place] [address]    Report Status PENDING  Incomplete  SARS CORONAVIRUS 2 (TAT 6-24 HRS) Nasopharyngeal Nasopharyngeal Swab     Status: None   Collection Time: 05/08/19  4:49 PM   Specimen: Nasopharyngeal Swab  Result Value Ref Range Status   SARS Coronavirus 2 NEGATIVE NEGATIVE Final    Comment: (NOTE) SARS-CoV-2  target nucleic acids are NOT DETECTED. The SARS-CoV-2 RNA is generally detectable in upper and lower respiratory specimens during the acute phase of infection. Negative results do not preclude SARS-CoV-2 infection, do not rule out co-infections with other pathogens, and should not be used as the sole basis for treatment or other patient management decisions. Negative results must be combined with clinical observations, patient history, and epidemiological information. The expected result is Negative. Fact Sheet for Patients: SugarRoll[employer]be Fact Sheet for Healthcare  Providers: https://www.woods-mathews.com/ This test is not yet approved or cleared by the [place] FDA and  has been authorized for detection and/or diagnosis of SARS-CoV-2 by FDA under an Emergency Use Authorization (EUA). This EUA will remain  in effect (meaning this test can be used) for the duration of the COVID-19 declaration under Section 56 4(b)(1) of the Act, 21 U.S.C. section 360bbb-3(b)(1), unless the authorization is terminated or revoked sooner. Performed at [facility], [place] [address] [place], [place] [address]   Urine culture     Status: None   Collection Time: 05/08/19 11:50 PM   Specimen: Urine, Random  Result Value Ref Range Status   Specimen Description   Final    URINE, RANDOM Performed at [facility] [address] [place], [place] [address]    Special Requests   Final    NONE Performed at [facility], [place] [address] [place], [place] [address]    Culture   Final    NO GROWTH Performed at [facility], [place] [address], [place], [place] [address]    Report Status 05/09/2019 FINAL  Final    Please note: You were cared for by a hospitalist during your hospital stay. Once you are discharged, your primary care physician will handle any further medical issues. Please note that NO REFILLS for any discharge medications will be authorized once you are discharged, as it is imperative that you return to your primary care physician (or establish a relationship with a primary care physician if you do not have one) for your post hospital discharge needs so that they can reassess your need for medications and monitor your lab values.    Time coordinating discharge: 40 minutes  SIGNED:   Shelly Coss, MD  [facility] 05/11/2019, 1:42 PM Pager [phone]  If 7PM-7AM, please contact night-coverage [email or web address] Password [email or web address]

## 2019-05-11 NOTE — Progress Notes (Signed)
Report given to [employer] and all questions answered. Patient made aware.

## 2019-05-11 NOTE — Care Management Important Message (Signed)
Important Message  Patient Details IM Letter given to Dessa Phi RN to present to the Patient Name: Christy May MRN: [id number] Date of Birth: Sep 24, 1950   Medicare Important Message Given:  Yes     Kerin Salen 05/11/2019, 10:47 AM

## 2019-05-12 DIAGNOSIS — R519 Headache, unspecified: Secondary | ICD-10-CM | POA: Diagnosis not present

## 2019-05-12 DIAGNOSIS — F329 Major depressive disorder, single episode, unspecified: Secondary | ICD-10-CM | POA: Diagnosis not present

## 2019-05-12 DIAGNOSIS — J189 Pneumonia, unspecified organism: Secondary | ICD-10-CM | POA: Diagnosis not present

## 2019-05-12 DIAGNOSIS — F419 Anxiety disorder, unspecified: Secondary | ICD-10-CM | POA: Diagnosis not present

## 2019-05-13 LAB — CULTURE, BLOOD (ROUTINE X 2)
Culture: NO GROWTH
Culture: NO GROWTH
Special Requests: ADEQUATE
Special Requests: ADEQUATE

## 2019-05-15 DIAGNOSIS — J189 Pneumonia, unspecified organism: Secondary | ICD-10-CM | POA: Diagnosis not present

## 2019-05-15 DIAGNOSIS — R5381 Other malaise: Secondary | ICD-10-CM | POA: Diagnosis not present

## 2019-05-15 DIAGNOSIS — R519 Headache, unspecified: Secondary | ICD-10-CM | POA: Diagnosis not present

## 2019-05-15 DIAGNOSIS — F419 Anxiety disorder, unspecified: Secondary | ICD-10-CM | POA: Diagnosis not present

## 2019-05-18 DIAGNOSIS — I10 Essential (primary) hypertension: Secondary | ICD-10-CM | POA: Diagnosis not present

## 2019-05-18 DIAGNOSIS — M797 Fibromyalgia: Secondary | ICD-10-CM | POA: Diagnosis not present

## 2019-05-18 DIAGNOSIS — Z7952 Long term (current) use of systemic steroids: Secondary | ICD-10-CM | POA: Diagnosis not present

## 2019-05-18 DIAGNOSIS — R6521 Severe sepsis with septic shock: Secondary | ICD-10-CM | POA: Diagnosis not present

## 2019-05-22 DIAGNOSIS — K5900 Constipation, unspecified: Secondary | ICD-10-CM | POA: Diagnosis not present

## 2019-05-22 DIAGNOSIS — G479 Sleep disorder, unspecified: Secondary | ICD-10-CM | POA: Diagnosis not present

## 2019-05-25 ENCOUNTER — Ambulatory Visit: Payer: Medicare Other | Admitting: Endocrinology

## 2019-05-26 DIAGNOSIS — R2681 Unsteadiness on feet: Secondary | ICD-10-CM | POA: Diagnosis not present

## 2019-05-26 DIAGNOSIS — R531 Weakness: Secondary | ICD-10-CM | POA: Diagnosis not present

## 2019-05-26 DIAGNOSIS — R296 Repeated falls: Secondary | ICD-10-CM | POA: Diagnosis not present

## 2019-05-29 DIAGNOSIS — R300 Dysuria: Secondary | ICD-10-CM | POA: Diagnosis not present

## 2019-05-29 DIAGNOSIS — R3989 Other symptoms and signs involving the genitourinary system: Secondary | ICD-10-CM | POA: Diagnosis not present

## 2019-05-29 DIAGNOSIS — B372 Candidiasis of skin and nail: Secondary | ICD-10-CM | POA: Diagnosis not present

## 2019-07-24 DEATH — deceased

## 2019-08-07 IMAGING — DX DG KNEE COMPLETE 4+V*R*
4 series · 4 of 4 positions shown · non-contrast
Comparison: None.

CLINICAL DATA: Right knee pain after fall today.

EXAM:
RIGHT KNEE - COMPLETE 4+ VIEW

[knee ap]
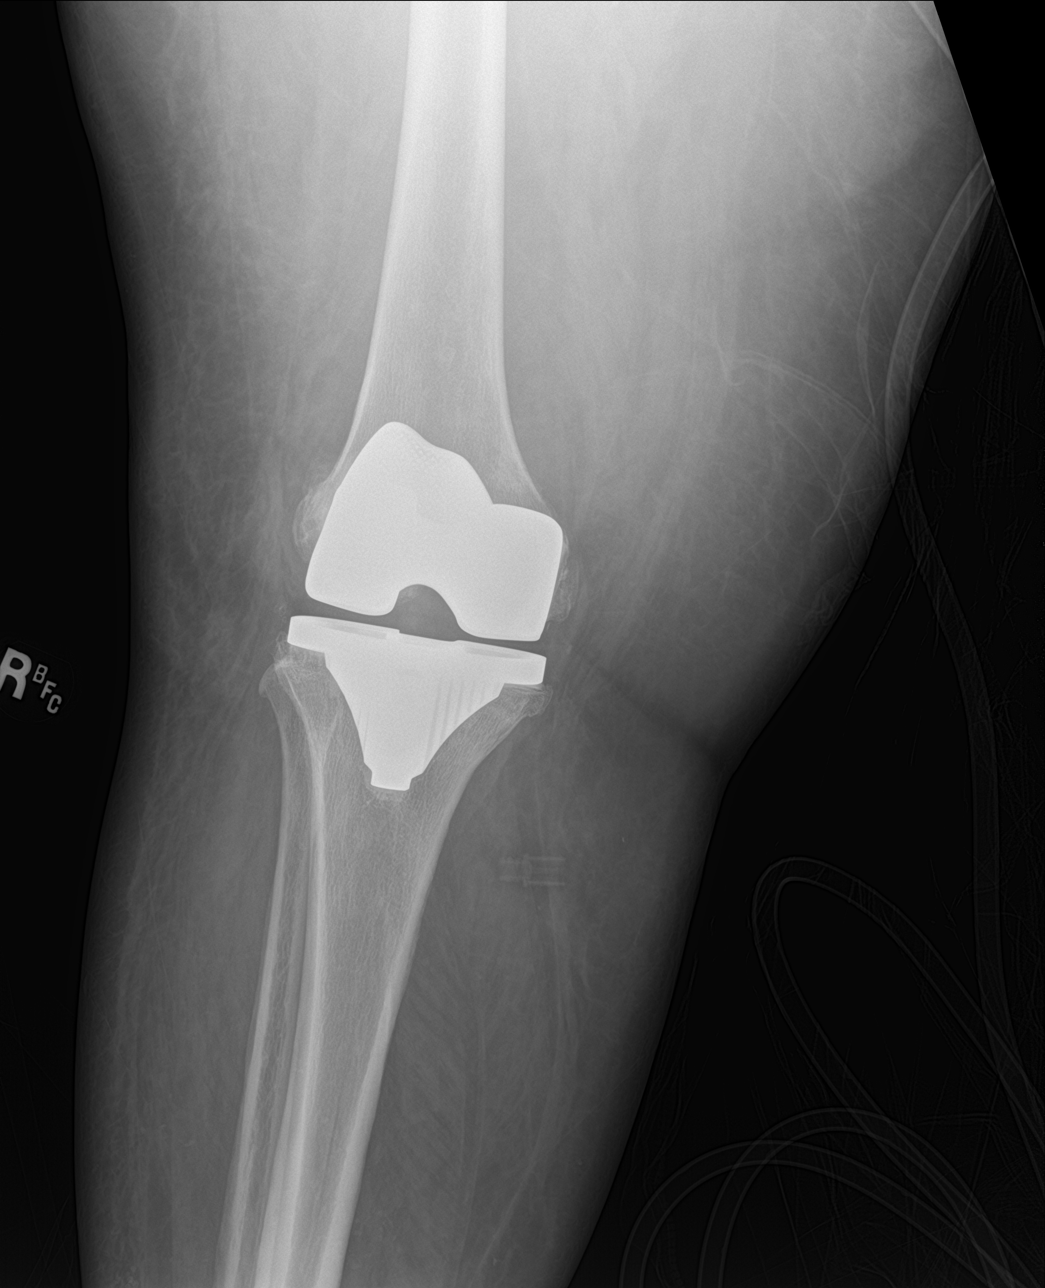

[knee lat]
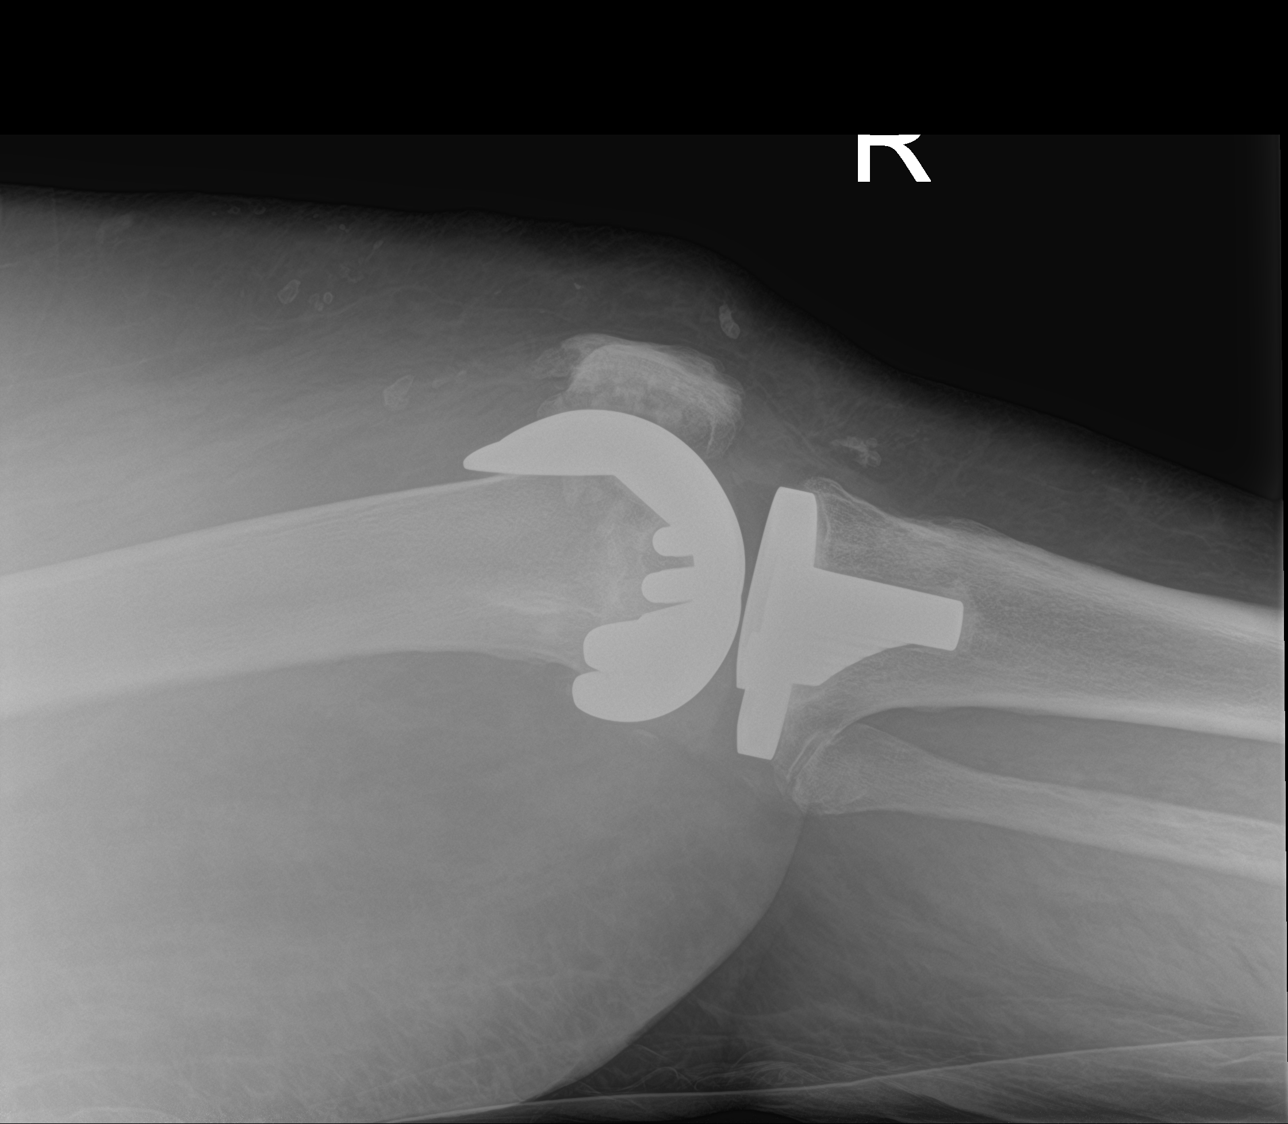

[knee obl (1 of 2)]
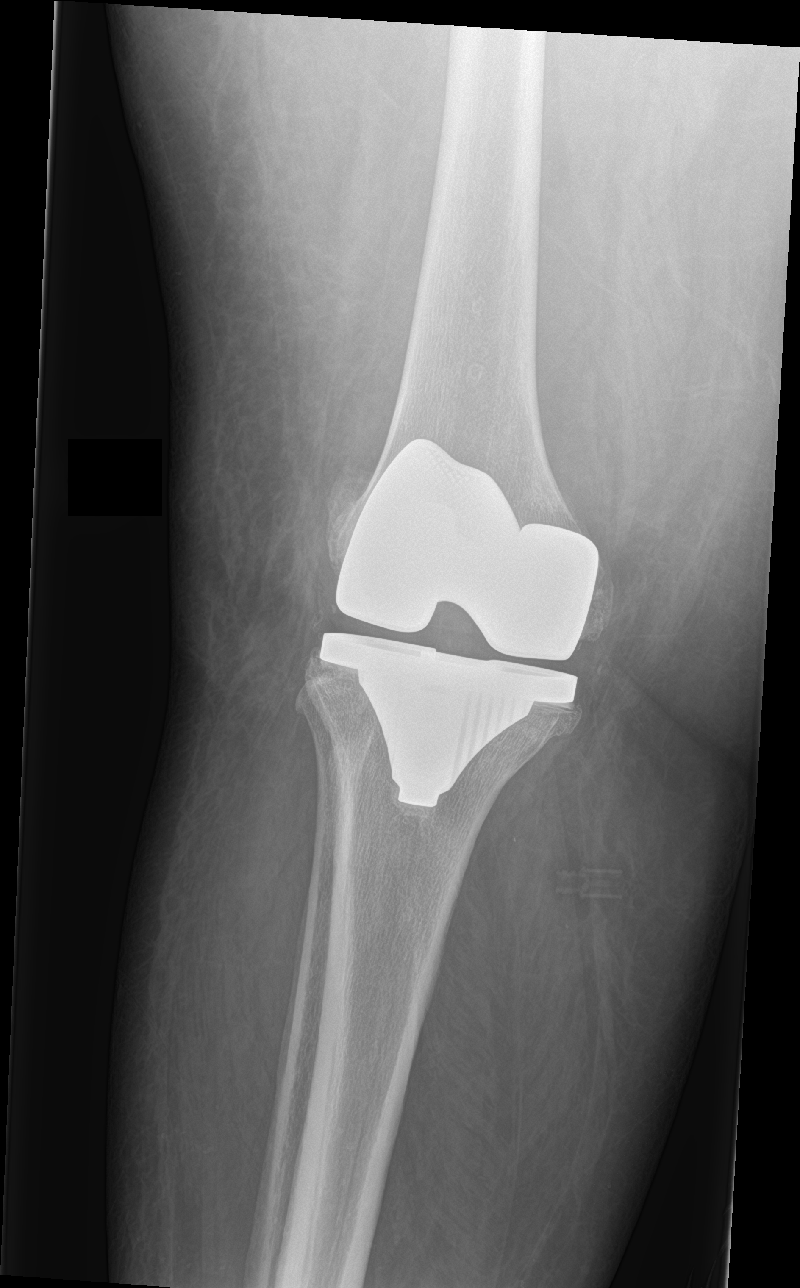

[knee obl (2 of 2)]
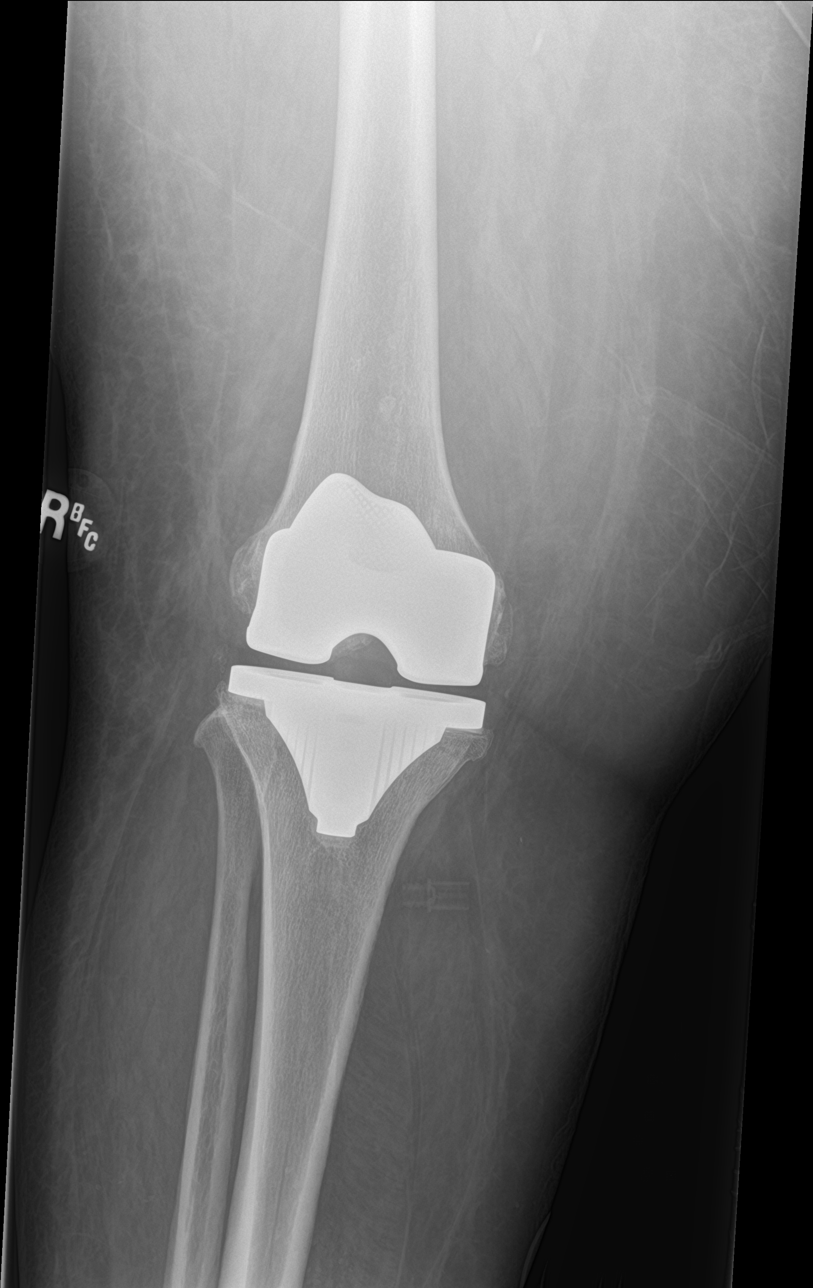

[4 of 4 positions shown; findings below may reference images not displayed]

FINDINGS: Intact right total knee arthroplasty. No periprosthetic fracture or
loosening identified. No joint effusion.
IMPRESSION: Intact right total knee arthroplasty without complicating features.
No fracture identified. No joint dislocation.

## 2019-08-07 IMAGING — DX DG HIP (WITH OR WITHOUT PELVIS) 2-3V*R*
3 series · 3 of 3 positions shown · non-contrast
Comparison: 10/24/2017

CLINICAL DATA: Right hip pain after fall.

EXAM:
DG HIP (WITH OR WITHOUT PELVIS) 2-3V RIGHT

[hip ap]
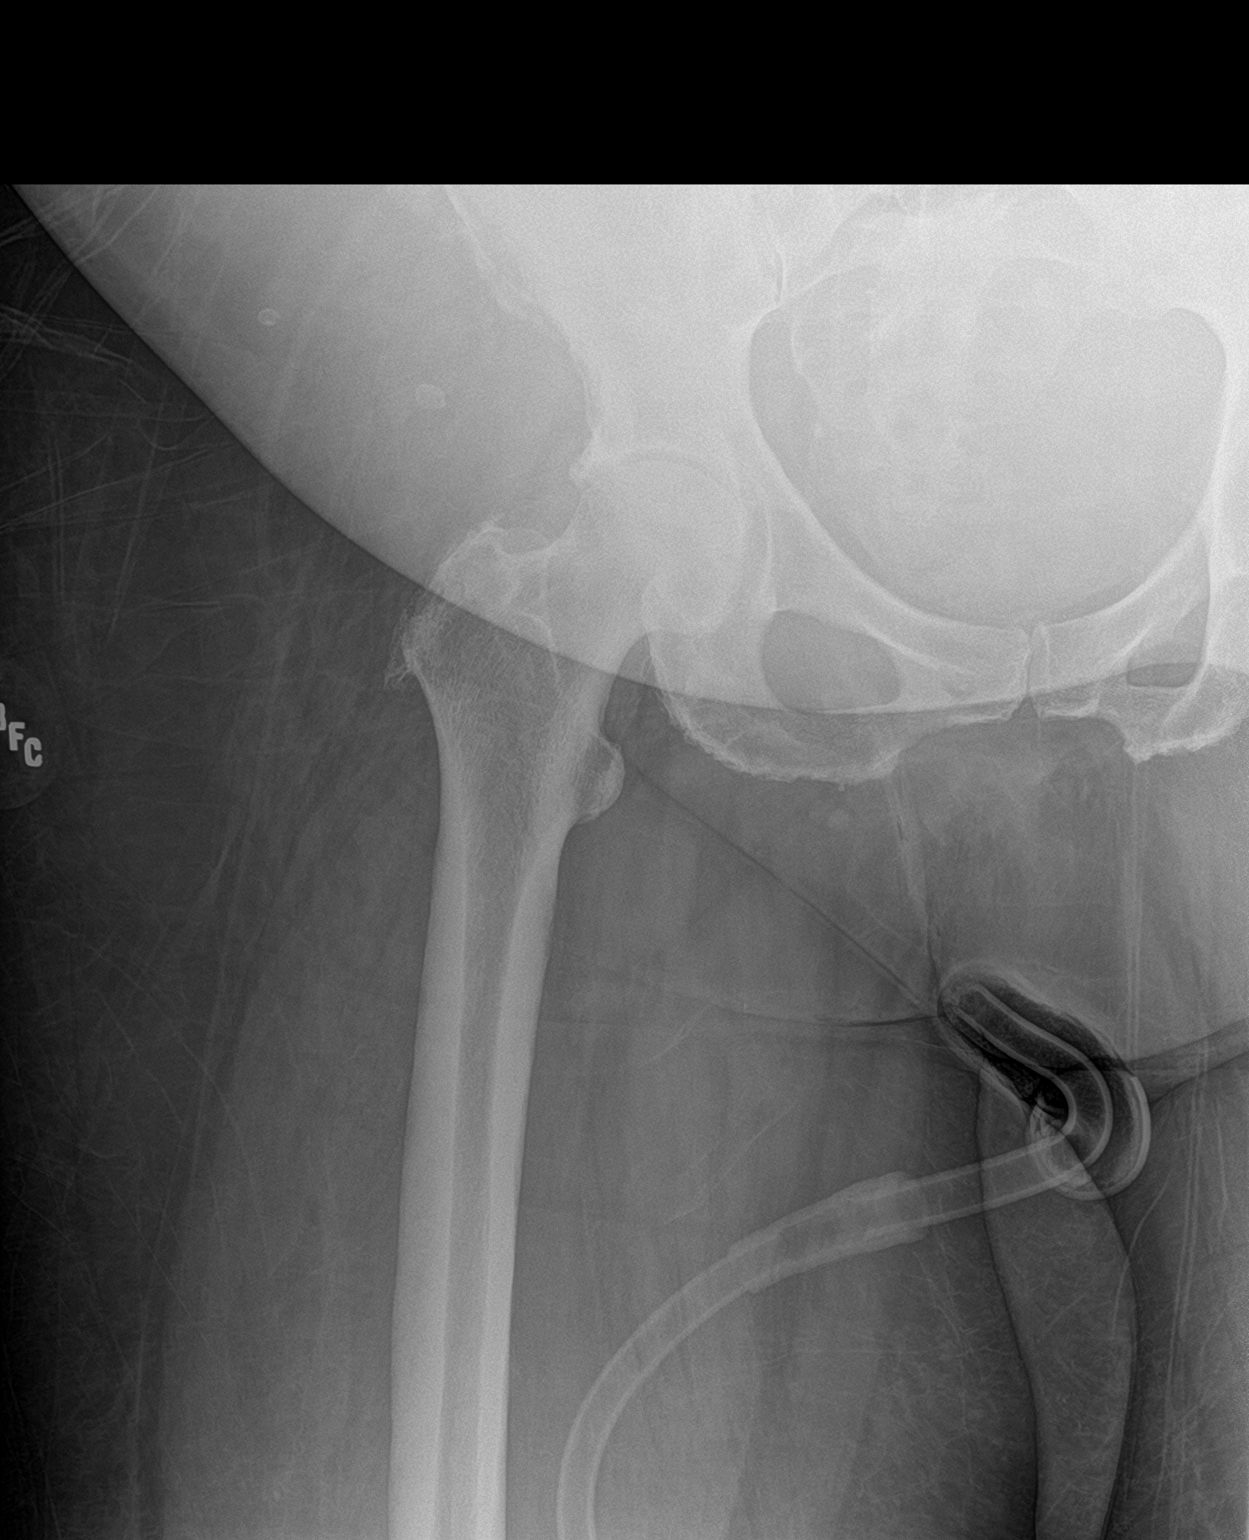

[hip lat]
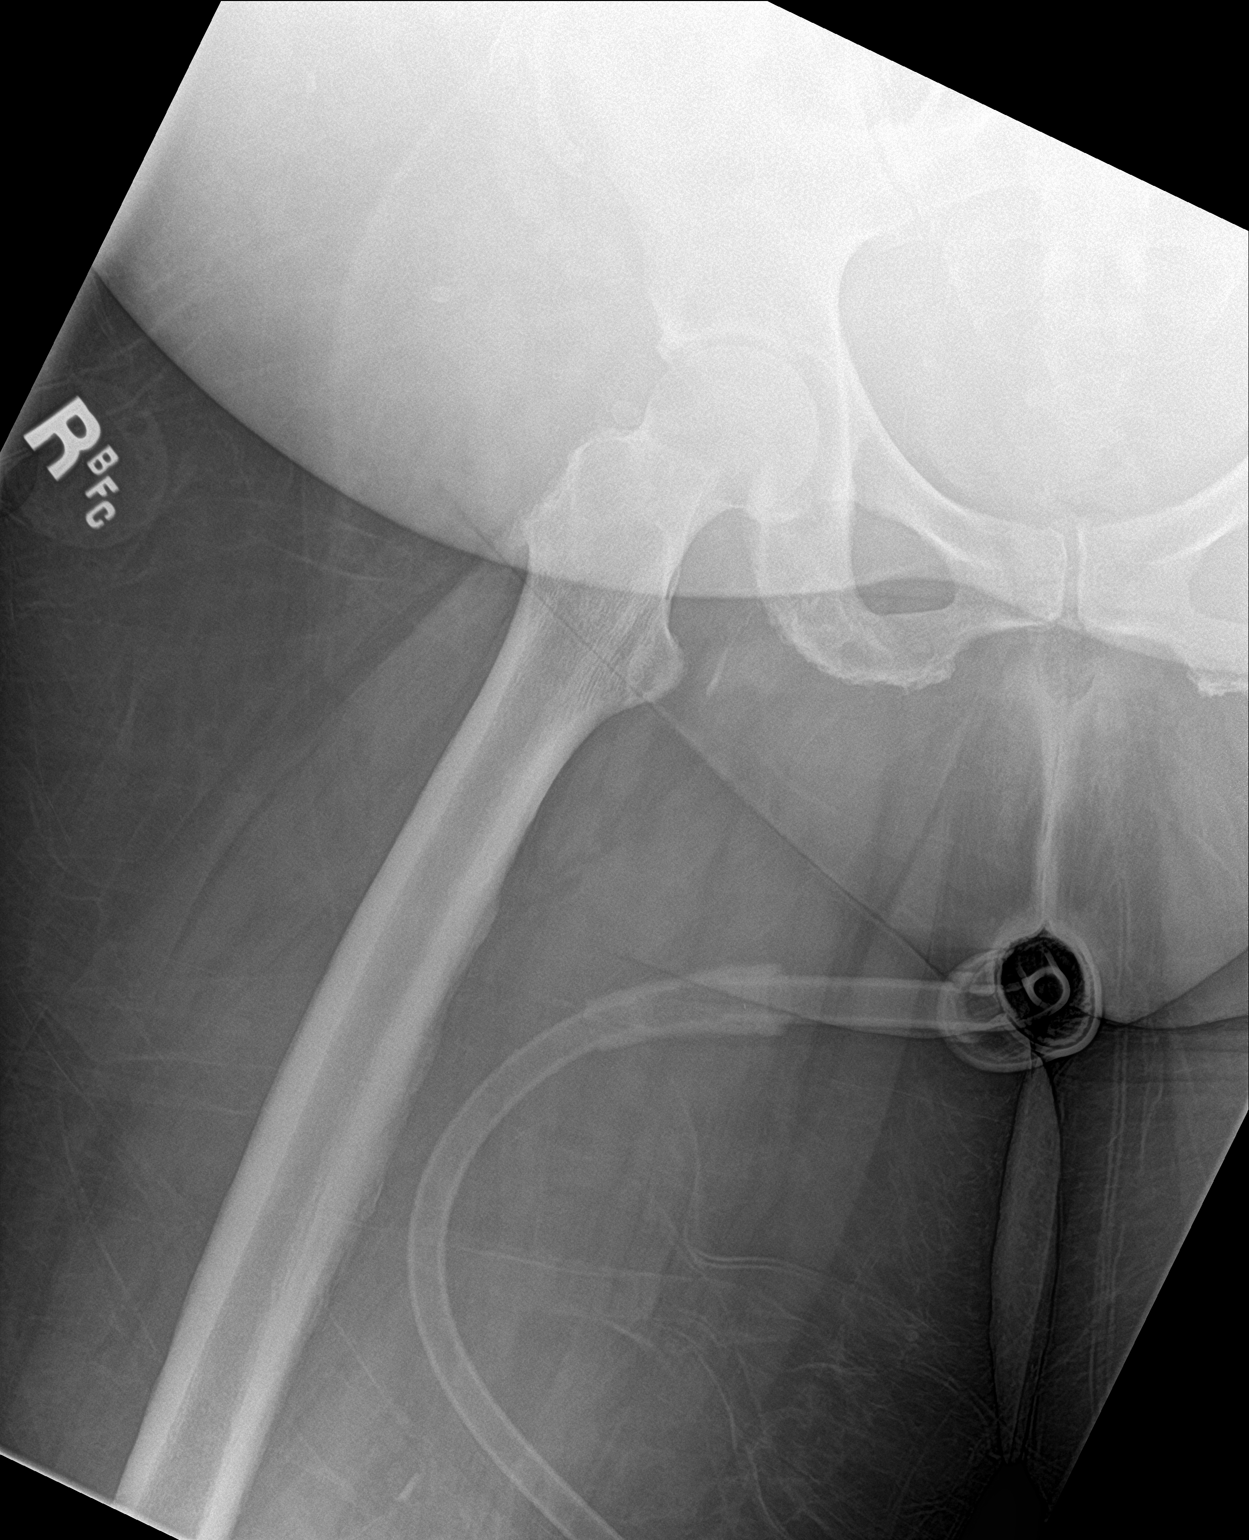

[pelvis ap]
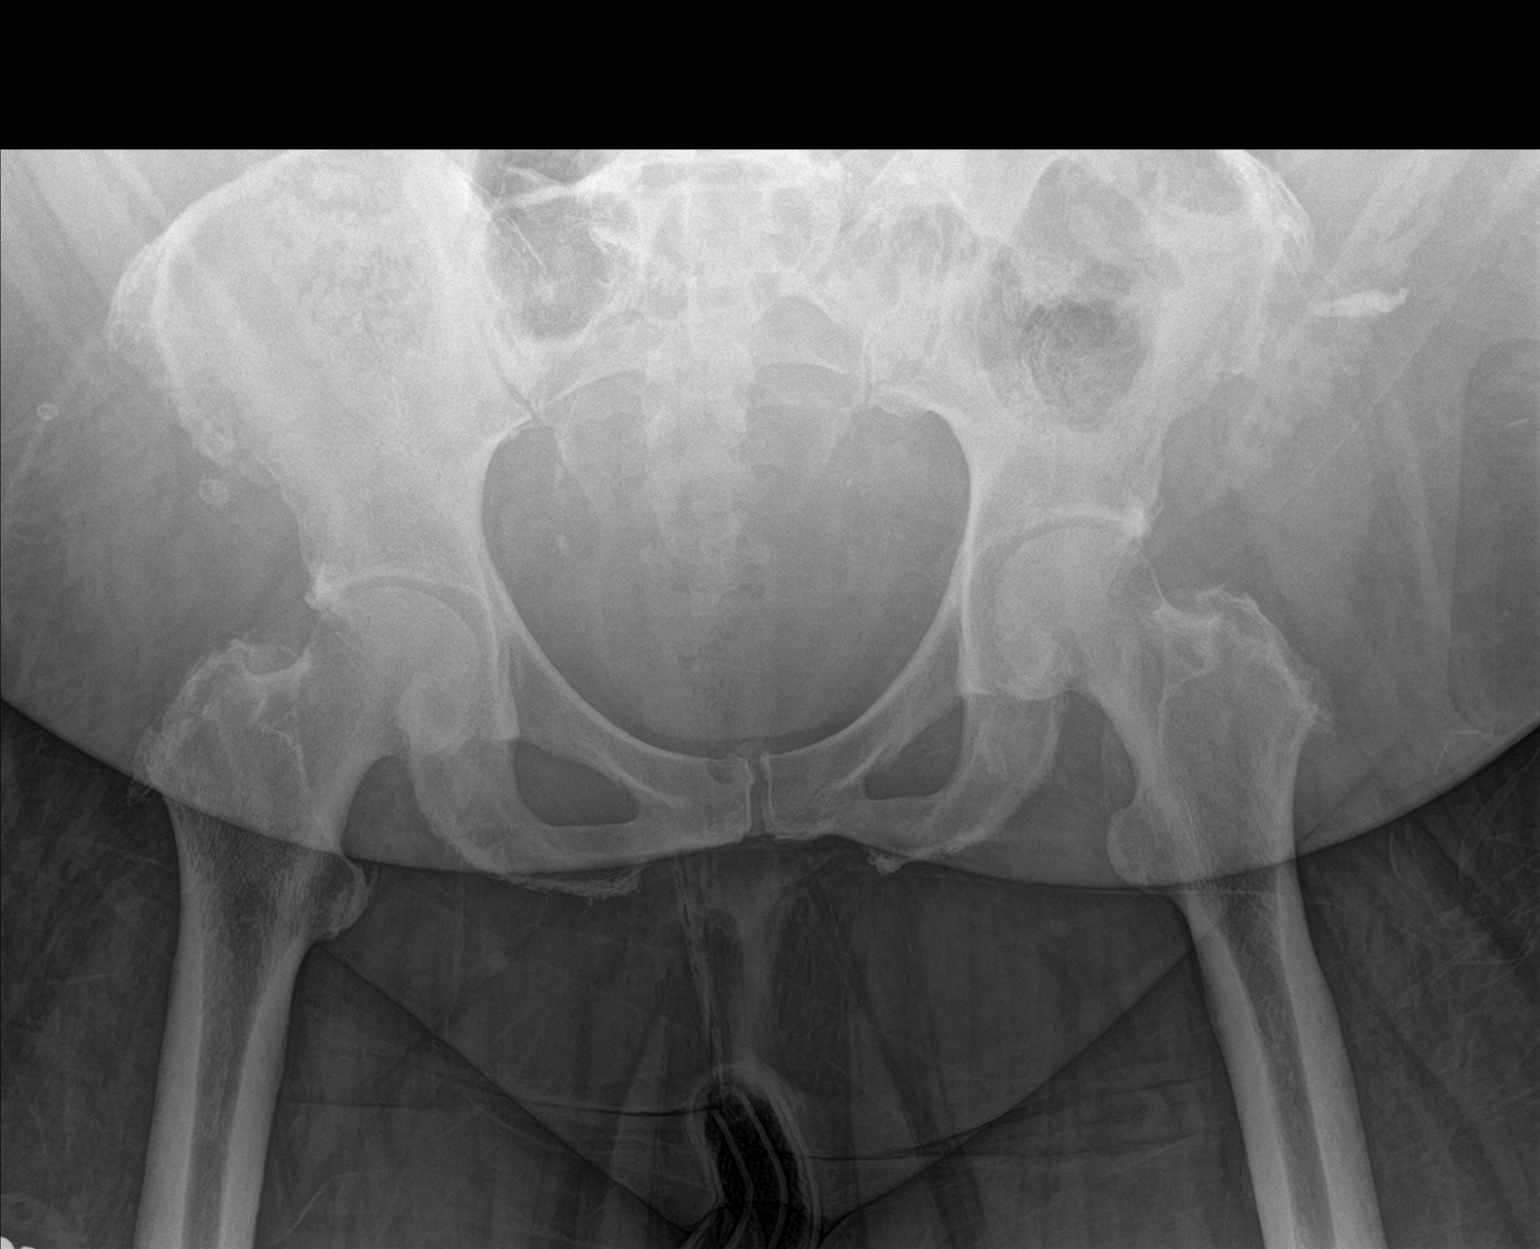

[3 of 3 positions shown; findings below may reference images not displayed]

FINDINGS: There is no evidence of hip fracture or dislocation. Right gluteal
and iliopsoas calcific tendinopathy. No significant soft tissue
swelling. Soft tissue calcifications consistent with granulomas or
phleboliths project over the pannus.
IMPRESSION: No fracture of the right hip. Right gluteal and iliopsoas calcific
tendinopathy.

## 2019-08-07 IMAGING — DX DG KNEE COMPLETE 4+V*L*
4 series · 4 of 4 positions shown · non-contrast
Comparison: None.

CLINICAL DATA: Left knee pain after fall

EXAM:
LEFT KNEE - COMPLETE 4+ VIEW

[knee ap]
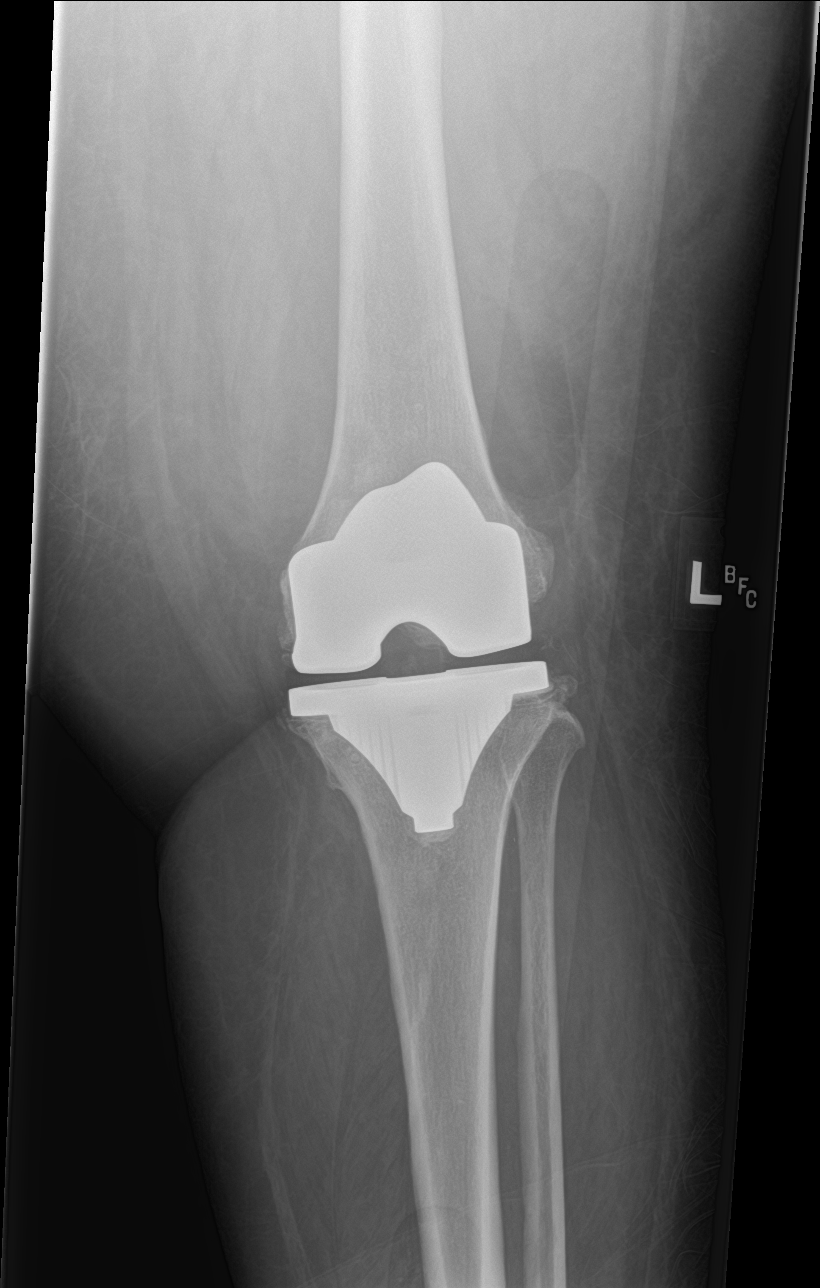

[knee lat]
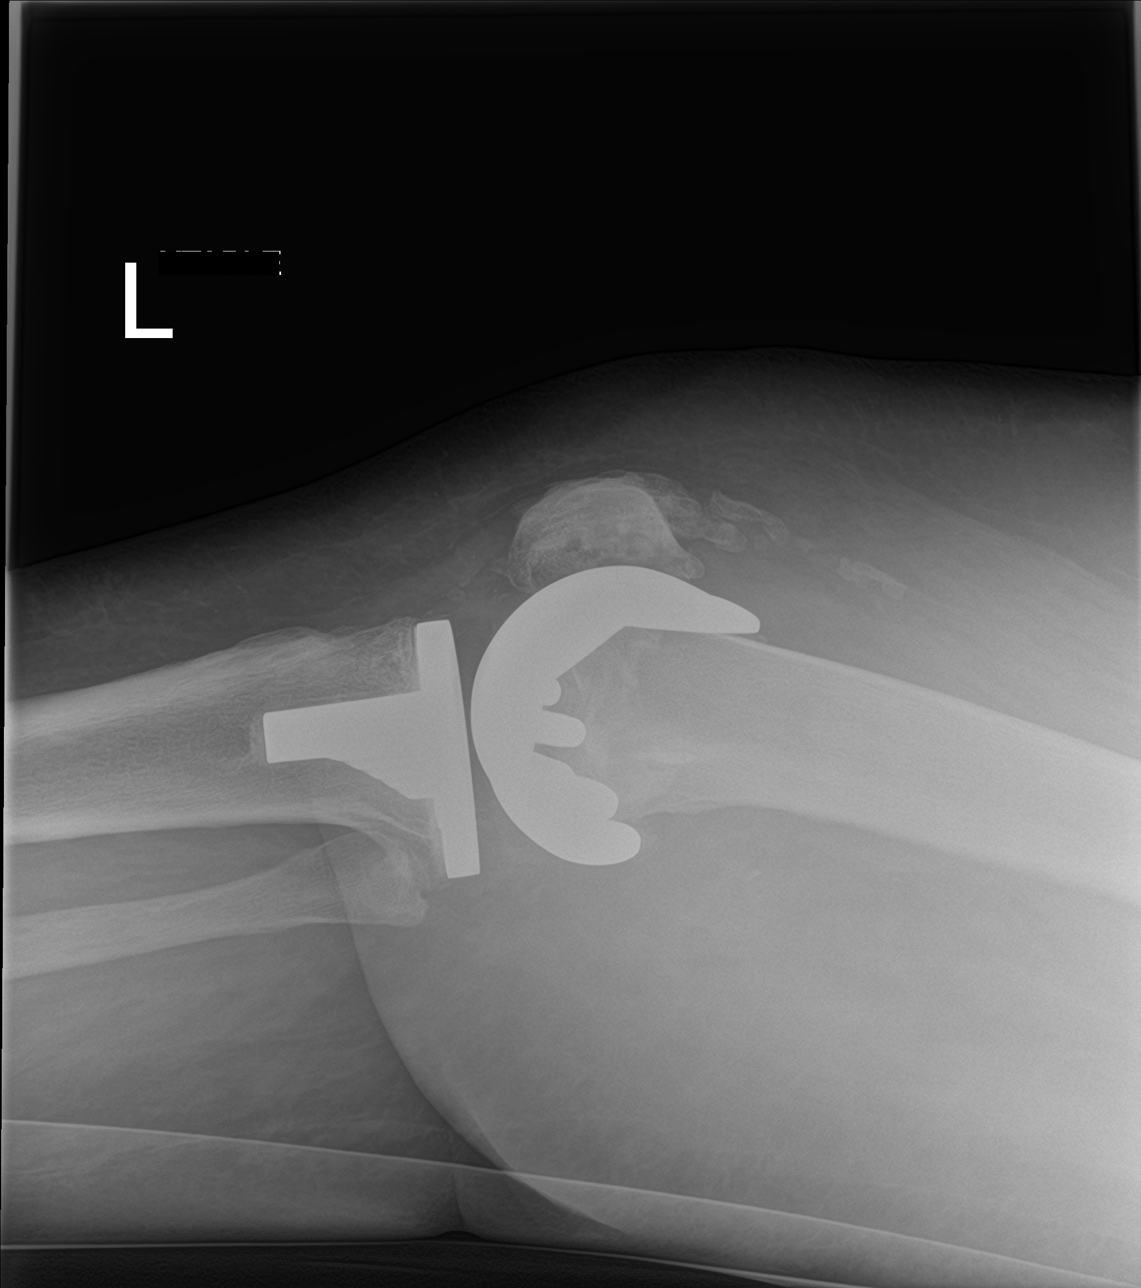

[knee obl (1 of 2)]
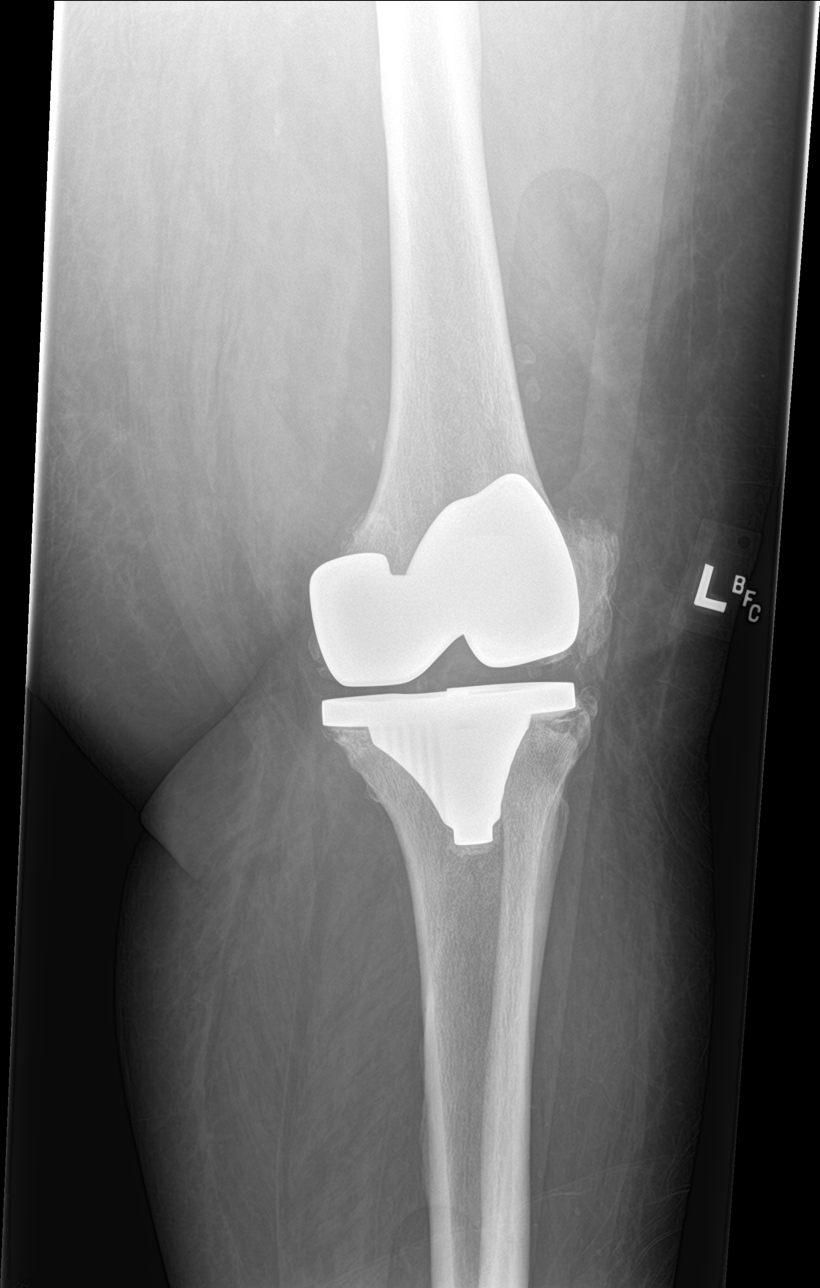

[knee obl (2 of 2)]
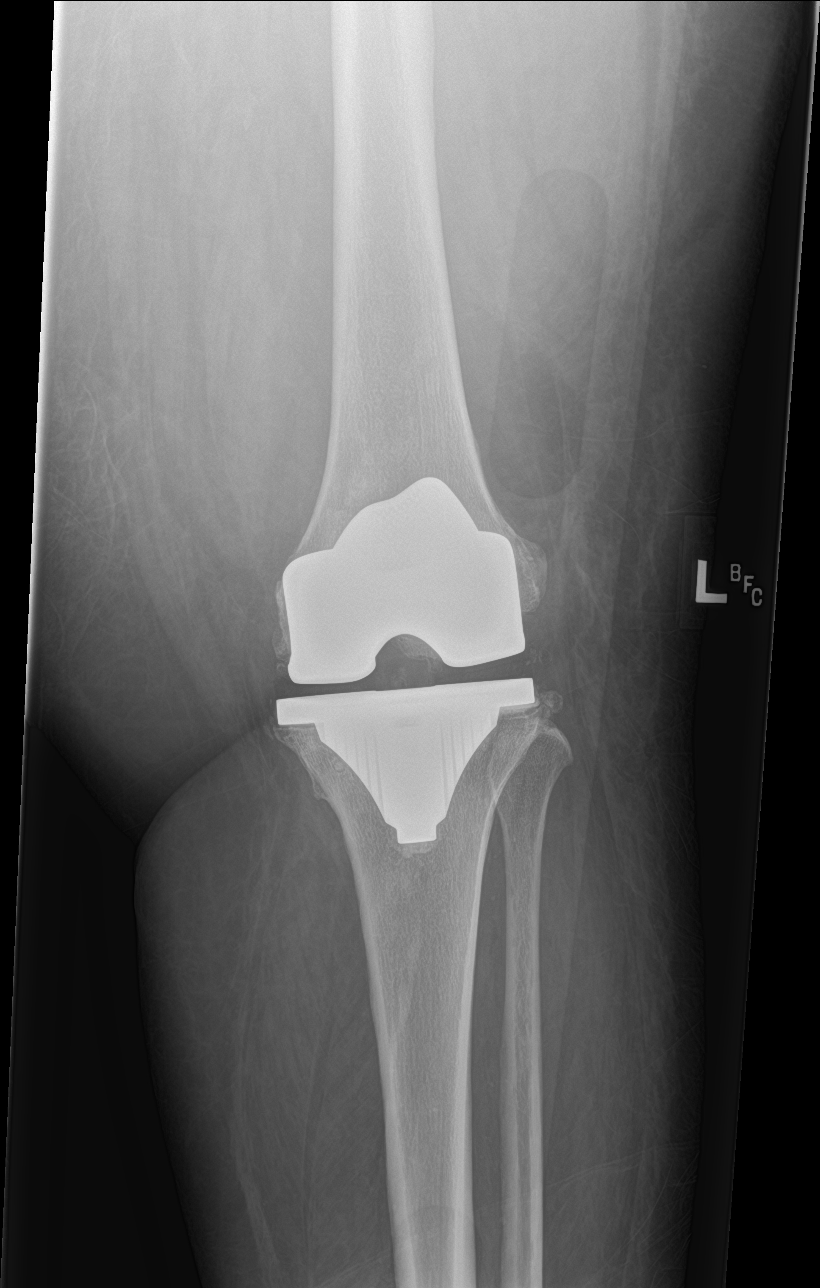

[4 of 4 positions shown; findings below may reference images not displayed]

FINDINGS: No evidence of fracture, dislocation, or joint effusion. No
periprosthetic fracture or evidence of loosening. Ossifications
along the quadriceps tendon likely from old remote trauma. No
evidence of arthropathy or other focal bone abnormality.
IMPRESSION: Intact left knee arthroplasty without complicating features. No
periprosthetic fracture or loosening noted.
